# Patient Record
Sex: Female | Born: 1960 | Race: White | Hispanic: No | Marital: Married | Smoking: Former smoker
Health system: Southern US, Community
[De-identification: ages and names within clinical notes are randomized; demographics above are authoritative.]

## PROBLEM LIST (undated history)

## (undated) DIAGNOSIS — E119 Type 2 diabetes mellitus without complications: Secondary | ICD-10-CM

## (undated) DIAGNOSIS — K5790 Diverticulosis of intestine, part unspecified, without perforation or abscess without bleeding: Secondary | ICD-10-CM

## (undated) DIAGNOSIS — M1990 Unspecified osteoarthritis, unspecified site: Secondary | ICD-10-CM

## (undated) DIAGNOSIS — M722 Plantar fascial fibromatosis: Secondary | ICD-10-CM

## (undated) DIAGNOSIS — K760 Fatty (change of) liver, not elsewhere classified: Secondary | ICD-10-CM

## (undated) DIAGNOSIS — N189 Chronic kidney disease, unspecified: Secondary | ICD-10-CM

## (undated) DIAGNOSIS — I6529 Occlusion and stenosis of unspecified carotid artery: Secondary | ICD-10-CM

## (undated) DIAGNOSIS — K449 Diaphragmatic hernia without obstruction or gangrene: Secondary | ICD-10-CM

## (undated) DIAGNOSIS — F419 Anxiety disorder, unspecified: Secondary | ICD-10-CM

## (undated) DIAGNOSIS — K219 Gastro-esophageal reflux disease without esophagitis: Secondary | ICD-10-CM

## (undated) DIAGNOSIS — Z8719 Personal history of other diseases of the digestive system: Secondary | ICD-10-CM

## (undated) DIAGNOSIS — I639 Cerebral infarction, unspecified: Secondary | ICD-10-CM

## (undated) DIAGNOSIS — I10 Essential (primary) hypertension: Secondary | ICD-10-CM

## (undated) DIAGNOSIS — F17200 Nicotine dependence, unspecified, uncomplicated: Secondary | ICD-10-CM

## (undated) DIAGNOSIS — K279 Peptic ulcer, site unspecified, unspecified as acute or chronic, without hemorrhage or perforation: Secondary | ICD-10-CM

## (undated) DIAGNOSIS — F32A Depression, unspecified: Secondary | ICD-10-CM

## (undated) DIAGNOSIS — F329 Major depressive disorder, single episode, unspecified: Secondary | ICD-10-CM

## (undated) DIAGNOSIS — K589 Irritable bowel syndrome without diarrhea: Secondary | ICD-10-CM

## (undated) DIAGNOSIS — I739 Peripheral vascular disease, unspecified: Secondary | ICD-10-CM

## (undated) DIAGNOSIS — M797 Fibromyalgia: Secondary | ICD-10-CM

## (undated) DIAGNOSIS — E7800 Pure hypercholesterolemia, unspecified: Secondary | ICD-10-CM

## (undated) HISTORY — DX: Type 2 diabetes mellitus without complications: E11.9

## (undated) HISTORY — PX: BACK SURGERY: SHX140

## (undated) HISTORY — PX: HERNIA REPAIR: SHX51

## (undated) HISTORY — DX: Irritable bowel syndrome, unspecified: K58.9

## (undated) HISTORY — DX: Cerebral infarction, unspecified: I63.9

## (undated) HISTORY — DX: Fibromyalgia: M79.7

## (undated) HISTORY — DX: Fatty (change of) liver, not elsewhere classified: K76.0

## (undated) HISTORY — DX: Plantar fascial fibromatosis: M72.2

## (undated) HISTORY — PX: TUBAL LIGATION: SHX77

## (undated) HISTORY — PX: TONSILLECTOMY: SUR1361

## (undated) HISTORY — DX: Occlusion and stenosis of unspecified carotid artery: I65.29

---

## 1988-02-18 HISTORY — PX: ABDOMINAL HYSTERECTOMY: SHX81

## 1999-02-18 HISTORY — PX: CHOLECYSTECTOMY: SHX55

## 2001-01-25 ENCOUNTER — Other Ambulatory Visit: Admission: RE | Admit: 2001-01-25 | Discharge: 2001-01-25 | Payer: Self-pay | Admitting: Obstetrics and Gynecology

## 2002-02-17 HISTORY — PX: SPINE SURGERY: SHX786

## 2002-11-15 ENCOUNTER — Encounter: Payer: Self-pay | Admitting: Neurosurgery

## 2002-11-15 ENCOUNTER — Inpatient Hospital Stay (HOSPITAL_COMMUNITY): Admission: RE | Admit: 2002-11-15 | Discharge: 2002-11-19 | Payer: Self-pay | Admitting: Neurosurgery

## 2005-04-15 ENCOUNTER — Ambulatory Visit: Payer: Self-pay | Admitting: Pain Medicine

## 2005-04-30 ENCOUNTER — Ambulatory Visit: Payer: Self-pay | Admitting: Pain Medicine

## 2005-05-18 ENCOUNTER — Encounter: Payer: Self-pay | Admitting: Family Medicine

## 2005-05-18 LAB — CONVERTED CEMR LAB: Pap Smear: NORMAL

## 2005-05-26 ENCOUNTER — Ambulatory Visit (HOSPITAL_COMMUNITY): Admission: RE | Admit: 2005-05-26 | Discharge: 2005-05-26 | Payer: Self-pay | Admitting: Obstetrics and Gynecology

## 2006-03-19 ENCOUNTER — Ambulatory Visit: Payer: Self-pay | Admitting: Family Medicine

## 2006-05-14 ENCOUNTER — Ambulatory Visit: Payer: Self-pay | Admitting: Family Medicine

## 2006-05-28 ENCOUNTER — Ambulatory Visit: Payer: Self-pay | Admitting: Family Medicine

## 2006-05-29 ENCOUNTER — Ambulatory Visit (HOSPITAL_COMMUNITY): Admission: RE | Admit: 2006-05-29 | Discharge: 2006-05-29 | Payer: Self-pay | Admitting: Family Medicine

## 2006-06-01 ENCOUNTER — Encounter: Payer: Self-pay | Admitting: Family Medicine

## 2006-06-01 DIAGNOSIS — E7800 Pure hypercholesterolemia, unspecified: Secondary | ICD-10-CM | POA: Insufficient documentation

## 2006-06-01 DIAGNOSIS — K5730 Diverticulosis of large intestine without perforation or abscess without bleeding: Secondary | ICD-10-CM | POA: Insufficient documentation

## 2006-06-01 DIAGNOSIS — J309 Allergic rhinitis, unspecified: Secondary | ICD-10-CM | POA: Insufficient documentation

## 2006-06-01 DIAGNOSIS — F410 Panic disorder [episodic paroxysmal anxiety] without agoraphobia: Secondary | ICD-10-CM

## 2006-06-01 DIAGNOSIS — F3289 Other specified depressive episodes: Secondary | ICD-10-CM | POA: Insufficient documentation

## 2006-06-01 DIAGNOSIS — Z87898 Personal history of other specified conditions: Secondary | ICD-10-CM | POA: Insufficient documentation

## 2006-06-01 DIAGNOSIS — F329 Major depressive disorder, single episode, unspecified: Secondary | ICD-10-CM

## 2006-06-01 DIAGNOSIS — K219 Gastro-esophageal reflux disease without esophagitis: Secondary | ICD-10-CM

## 2006-06-01 DIAGNOSIS — F17200 Nicotine dependence, unspecified, uncomplicated: Secondary | ICD-10-CM

## 2006-06-15 ENCOUNTER — Telehealth (INDEPENDENT_AMBULATORY_CARE_PROVIDER_SITE_OTHER): Payer: Self-pay | Admitting: *Deleted

## 2006-06-26 ENCOUNTER — Telehealth: Payer: Self-pay | Admitting: Family Medicine

## 2006-12-24 ENCOUNTER — Ambulatory Visit: Payer: Self-pay | Admitting: Gastroenterology

## 2007-01-02 ENCOUNTER — Ambulatory Visit: Payer: Self-pay | Admitting: Gastroenterology

## 2007-01-02 ENCOUNTER — Encounter: Payer: Self-pay | Admitting: Family Medicine

## 2007-01-29 ENCOUNTER — Ambulatory Visit: Payer: Self-pay | Admitting: Gastroenterology

## 2007-01-29 ENCOUNTER — Encounter: Payer: Self-pay | Admitting: Family Medicine

## 2007-02-08 ENCOUNTER — Telehealth: Payer: Self-pay | Admitting: Family Medicine

## 2007-07-14 ENCOUNTER — Ambulatory Visit: Payer: Self-pay | Admitting: Family Medicine

## 2008-04-18 ENCOUNTER — Ambulatory Visit: Payer: Self-pay | Admitting: Internal Medicine

## 2008-05-17 ENCOUNTER — Ambulatory Visit: Payer: Self-pay | Admitting: Internal Medicine

## 2008-08-08 ENCOUNTER — Ambulatory Visit: Payer: Self-pay | Admitting: General Surgery

## 2008-08-11 ENCOUNTER — Ambulatory Visit: Payer: Self-pay | Admitting: General Surgery

## 2008-08-14 ENCOUNTER — Ambulatory Visit: Payer: Self-pay | Admitting: General Surgery

## 2008-11-09 ENCOUNTER — Ambulatory Visit: Payer: Self-pay | Admitting: Internal Medicine

## 2008-11-13 ENCOUNTER — Ambulatory Visit: Payer: Self-pay | Admitting: Gastroenterology

## 2008-11-17 ENCOUNTER — Ambulatory Visit: Payer: Self-pay | Admitting: Internal Medicine

## 2010-09-30 ENCOUNTER — Inpatient Hospital Stay: Payer: Self-pay | Admitting: Internal Medicine

## 2010-10-07 LAB — PATHOLOGY REPORT

## 2011-02-18 DIAGNOSIS — I639 Cerebral infarction, unspecified: Secondary | ICD-10-CM

## 2011-02-18 HISTORY — DX: Cerebral infarction, unspecified: I63.9

## 2011-02-21 ENCOUNTER — Other Ambulatory Visit: Payer: Self-pay

## 2011-02-21 LAB — CBC WITH DIFFERENTIAL/PLATELET
Basophil #: 0.1 10*3/uL (ref 0.0–0.1)
Basophil %: 0.9 %
Eosinophil #: 0.2 10*3/uL (ref 0.0–0.7)
Eosinophil %: 2.9 %
HCT: 40.7 % (ref 35.0–47.0)
HGB: 13.8 g/dL (ref 12.0–16.0)
Lymphocyte #: 3 10*3/uL (ref 1.0–3.6)
Lymphocyte %: 37.7 %
MCH: 28.5 pg (ref 26.0–34.0)
MCHC: 34.1 g/dL (ref 32.0–36.0)
MCV: 84 fL (ref 80–100)
Monocyte #: 0.6 10*3/uL (ref 0.0–0.7)
Monocyte %: 7.1 %
Neutrophil #: 4.1 10*3/uL (ref 1.4–6.5)
Neutrophil %: 51.4 %
Platelet: 288 10*3/uL (ref 150–440)
RBC: 4.86 10*6/uL (ref 3.80–5.20)
RDW: 14.9 % — ABNORMAL HIGH (ref 11.5–14.5)
WBC: 8.1 10*3/uL (ref 3.6–11.0)

## 2011-02-21 LAB — COMPREHENSIVE METABOLIC PANEL
Albumin: 4 g/dL (ref 3.4–5.0)
Alkaline Phosphatase: 89 U/L (ref 50–136)
Anion Gap: 9 (ref 7–16)
BUN: 22 mg/dL — ABNORMAL HIGH (ref 7–18)
Bilirubin,Total: 0.2 mg/dL (ref 0.2–1.0)
Calcium, Total: 9.3 mg/dL (ref 8.5–10.1)
Chloride: 100 mmol/L (ref 98–107)
Co2: 29 mmol/L (ref 21–32)
Creatinine: 0.63 mg/dL (ref 0.60–1.30)
EGFR (African American): >60
EGFR (Non-African Amer.): >60
Glucose: 162 mg/dL — ABNORMAL HIGH (ref 65–99)
Osmolality: 283 (ref 275–301)
Potassium: 3.9 mmol/L (ref 3.5–5.1)
SGOT(AST): 30 U/L (ref 15–37)
SGPT (ALT): 43 U/L
Sodium: 138 mmol/L (ref 136–145)
Total Protein: 8.2 g/dL (ref 6.4–8.2)

## 2011-02-21 LAB — TSH: Thyroid Stimulating Horm: 1.76 u[IU]/mL

## 2011-03-17 ENCOUNTER — Ambulatory Visit: Payer: Self-pay | Admitting: Rheumatology

## 2011-03-17 LAB — SEDIMENTATION RATE: Erythrocyte Sed Rate: 16 mm/hr (ref 0–20)

## 2011-03-17 LAB — CK: CK, Total: 86 U/L (ref 21–215)

## 2011-04-22 ENCOUNTER — Encounter: Payer: Self-pay | Admitting: Rheumatology

## 2011-05-19 ENCOUNTER — Encounter: Payer: Self-pay | Admitting: Rheumatology

## 2011-06-18 ENCOUNTER — Encounter: Payer: Self-pay | Admitting: Rheumatology

## 2011-06-25 ENCOUNTER — Other Ambulatory Visit: Payer: Self-pay

## 2011-06-25 LAB — COMPREHENSIVE METABOLIC PANEL
Albumin: 3.7 g/dL (ref 3.4–5.0)
Alkaline Phosphatase: 126 U/L (ref 50–136)
Bilirubin,Total: 0.3 mg/dL (ref 0.2–1.0)
Chloride: 98 mmol/L (ref 98–107)
Creatinine: 0.62 mg/dL (ref 0.60–1.30)
EGFR (African American): >60
EGFR (Non-African Amer.): >60
Glucose: 298 mg/dL — ABNORMAL HIGH (ref 65–99)
Osmolality: 278 (ref 275–301)
Sodium: 133 mmol/L — ABNORMAL LOW (ref 136–145)

## 2011-06-25 LAB — HEMOGLOBIN A1C: Hemoglobin A1C: 10.9 % — ABNORMAL HIGH (ref 4.2–6.3)

## 2011-12-31 ENCOUNTER — Ambulatory Visit: Payer: Self-pay

## 2012-01-31 ENCOUNTER — Inpatient Hospital Stay: Payer: Self-pay | Admitting: Internal Medicine

## 2012-01-31 DIAGNOSIS — I639 Cerebral infarction, unspecified: Secondary | ICD-10-CM

## 2012-01-31 HISTORY — DX: Cerebral infarction, unspecified: I63.9

## 2012-01-31 LAB — COMPREHENSIVE METABOLIC PANEL
Alkaline Phosphatase: 105 U/L (ref 50–136)
Anion Gap: 8 (ref 7–16)
BUN: 19 mg/dL — ABNORMAL HIGH (ref 7–18)
Bilirubin,Total: 0.3 mg/dL (ref 0.2–1.0)
Calcium, Total: 9.2 mg/dL (ref 8.5–10.1)
Chloride: 102 mmol/L (ref 98–107)
Co2: 26 mmol/L (ref 21–32)
Creatinine: 0.55 mg/dL — ABNORMAL LOW (ref 0.60–1.30)
EGFR (African American): >60
EGFR (Non-African Amer.): >60
Glucose: 143 mg/dL — ABNORMAL HIGH (ref 65–99)
SGPT (ALT): 53 U/L (ref 12–78)
Total Protein: 8.4 g/dL — ABNORMAL HIGH (ref 6.4–8.2)

## 2012-01-31 LAB — URINALYSIS, COMPLETE
Ketone: NEGATIVE
Leukocyte Esterase: NEGATIVE
Nitrite: POSITIVE
Ph: 5 (ref 4.5–8.0)
Protein: NEGATIVE
Specific Gravity: 1.012 (ref 1.003–1.030)
WBC UR: 8 /HPF (ref 0–5)

## 2012-01-31 LAB — APTT: Activated PTT: 27.2 secs (ref 23.6–35.9)

## 2012-01-31 LAB — PROTIME-INR: INR: 0.9

## 2012-01-31 LAB — CBC WITH DIFFERENTIAL/PLATELET
Basophil #: 0.1 10*3/uL (ref 0.0–0.1)
Basophil %: 0.9 %
Eosinophil #: 0.2 10*3/uL (ref 0.0–0.7)
HGB: 15.6 g/dL (ref 12.0–16.0)
Lymphocyte #: 3.9 10*3/uL — ABNORMAL HIGH (ref 1.0–3.6)
MCHC: 33.2 g/dL (ref 32.0–36.0)
Monocyte #: 0.7 x10 3/mm (ref 0.2–0.9)
Neutrophil #: 5.4 10*3/uL (ref 1.4–6.5)
Neutrophil %: 52.3 %
RBC: 5.62 10*6/uL — ABNORMAL HIGH (ref 3.80–5.20)
RDW: 15 % — ABNORMAL HIGH (ref 11.5–14.5)

## 2012-01-31 LAB — CK TOTAL AND CKMB (NOT AT ARMC): CK, Total: 89 U/L (ref 21–215)

## 2012-01-31 LAB — TROPONIN I: Troponin-I: <0.02 ng/mL

## 2012-02-01 DIAGNOSIS — G459 Transient cerebral ischemic attack, unspecified: Secondary | ICD-10-CM

## 2012-02-01 LAB — BASIC METABOLIC PANEL
Anion Gap: 8 (ref 7–16)
Calcium, Total: 9.3 mg/dL (ref 8.5–10.1)
EGFR (African American): >60
EGFR (Non-African Amer.): >60
Glucose: 133 mg/dL — ABNORMAL HIGH (ref 65–99)
Osmolality: 279 (ref 275–301)
Potassium: 4.1 mmol/L (ref 3.5–5.1)

## 2012-02-01 LAB — CBC WITH DIFFERENTIAL/PLATELET
Basophil %: 0.6 %
Eosinophil %: 3 %
HCT: 47.2 % — ABNORMAL HIGH (ref 35.0–47.0)
HGB: 16 g/dL (ref 12.0–16.0)
Lymphocyte #: 3.1 10*3/uL (ref 1.0–3.6)
Lymphocyte %: 32.8 %
MCHC: 33.9 g/dL (ref 32.0–36.0)
MCV: 85 fL (ref 80–100)
Monocyte %: 7.6 %
Neutrophil %: 56 %
RBC: 5.58 10*6/uL — ABNORMAL HIGH (ref 3.80–5.20)
WBC: 9.5 10*3/uL (ref 3.6–11.0)

## 2012-02-01 LAB — LIPID PANEL
Cholesterol: 351 mg/dL — ABNORMAL HIGH (ref 0–200)
HDL Cholesterol: 35 mg/dL — ABNORMAL LOW (ref 40–60)
Triglycerides: 617 mg/dL — ABNORMAL HIGH (ref 0–200)

## 2012-02-01 LAB — PROTIME-INR: INR: 0.8

## 2012-02-01 LAB — TROPONIN I: Troponin-I: <0.02 ng/mL

## 2012-02-01 LAB — CK TOTAL AND CKMB (NOT AT ARMC): CK, Total: 104 U/L (ref 21–215)

## 2012-02-18 DIAGNOSIS — I6523 Occlusion and stenosis of bilateral carotid arteries: Secondary | ICD-10-CM

## 2012-02-18 HISTORY — DX: Occlusion and stenosis of bilateral carotid arteries: I65.23

## 2012-02-27 ENCOUNTER — Encounter: Payer: Self-pay | Admitting: Neurology

## 2012-02-27 ENCOUNTER — Ambulatory Visit (INDEPENDENT_AMBULATORY_CARE_PROVIDER_SITE_OTHER): Payer: BC Managed Care – PPO | Admitting: Neurology

## 2012-02-27 ENCOUNTER — Other Ambulatory Visit: Payer: Self-pay | Admitting: Neurology

## 2012-02-27 VITALS — BP 124/80 | HR 90 | Temp 98.1°F | Resp 12 | Ht 67.0 in | Wt 203.0 lb

## 2012-02-27 DIAGNOSIS — I6350 Cerebral infarction due to unspecified occlusion or stenosis of unspecified cerebral artery: Secondary | ICD-10-CM

## 2012-02-27 DIAGNOSIS — G4730 Sleep apnea, unspecified: Secondary | ICD-10-CM

## 2012-02-27 DIAGNOSIS — I639 Cerebral infarction, unspecified: Secondary | ICD-10-CM

## 2012-02-27 MED ORDER — ASPIRIN-DIPYRIDAMOLE ER 25-200 MG PO CP12: 1.0000 | ORAL_CAPSULE | Freq: Two times a day (BID) | ORAL | Status: DC

## 2012-02-27 NOTE — Patient Instructions (Addendum)
Follow up in 4 months 

## 2012-02-27 NOTE — Progress Notes (Signed)
Morgan Daniels is 52 YO woman with DM type II and high cholesterol as well as a past history of fatty liver, IB, tremor, ADD, severe GERD, obesity with a current BMI of 31.7 and now with recent acute CVA.  2 months ago she had a single episode of veering to the left and maybe hand tingling and it cleared within a few minutes.  Last month she was admitted to [facility] for sudden onset of right hand tingling, right leg weakness and changes in orientation and speech.  Initial CT negative but follow up MRI showed acute CVA in the ? Right parietal area per DC summary as well as some evidence of past ischemia, location not sepcified.  We today have a signed release to acquire the mri images for review.  During her hospital stay of 2 days, she improved a lot and has minimal residual weakness of the right leg and she speaks well, but her memory and processing speed feel slow.  She has not yet tried to drive because she feels weird.  ECHO was unremarkable.  She was told she had 57% carotid stenosis and that will be followed.  She was placed on aggrenox bid and she is taking it.  Plavix was not given as she takes prilosec which she says is a must.  She denies previously being aware of having had a stroke.  She does snore and sometimes gasps for breath.  She sleeps poorly and has daytime sleepiness and fatigue.  Her vision acuiyt is decreased since the stroke, but she has not had her eyes checked in some time.  She has had some elevated Hgb Aic readings in the past 12 months and she is going to try to lose some weight.  She did not tolerate the choesterol lowering drug she was given.  Now she would like to get an opinion aboiut the MRI and her condition and advice for the future and prognosis.  Past Medical History  Diagnosis Date  . IBS (irritable bowel syndrome)   . Fibromyalgia   . Plantar fasciitis   . Fatty liver   . Diabetes     No current outpatient prescriptions on file prior to visit.   she is taking aggrenox  bid, elavil,gabapentin 600 bid, vit D3, zyrtec 10, invokana 10, zofran 8 prn fluoxetine 40, prilosec, alprazolam 0.5 prn, HCTZ and glimiprimide for type II DM  Allergies: Morphine and related   History   Social History  . Marital Status: Married    Spouse Name: N/A    Number of Children: N/A  . Years of Education: N/A   Occupational History  . Not on file.   Social History Main Topics  . Smoking status: Current Every Day Smoker  . Smokeless tobacco: Never Used  . Alcohol Use: Not on file     Comment: once a month  . Drug Use: No  . Sexually Active: Not on file   Other Topics Concern  . Not on file   Social History Narrative  . No narrative on file   No family history on file.  Mother has tremor and high cholesterol and borderline DM.  Review of symptoms pos for GERD,imbalance,urinary urgency, joint pain, depression, anxiety, sinus issues, poor sleep, poor memory, IBS, tremor of the hands, fear of parkinson's.  Remainder of 14 point ros unremarkable.  Alert and oriented x 3.  Memory function appears to be intact.  Concentration and attention are normal for educational level and background.  Speech is fluent and  without significant word finding difficulty.  Is aware of current events. Is nervous and ADD like  No carotid bruits detected.  Cranial nerve II through XII are within normal limits.  This includes normal optic discs and acuity, EOMI, PERLA, facial movement and sensation intact, hearing grossly intact, gag intact,Uvula raises symmetrically and tongue protrudes evenly. Motor strength is 5 over 5 except 4/5 right ankle dorsiflexion.  No atrophy, abnormal tone .  Fine tremor of the hands withintention and postures but not at rest. Reflexes are1+ and symmetric in the upper and lower extremities Sensory exam is intact.  No extinction on double simultaneous stim and graphesthesia is normal. Coordination is intact for fine movements and rapid alternating movements in all  limbs Gait and station are normal.   Impression. 51 YO with obesity and DM and high cholesterol with CVA said to be right parietal and 57% carotid stenosis  However, not tolerating cholesterol lowering medications at this time.  She is tolerating aggrenox and as she needs prilosec for GERD, this is preferred over the plavix for CVA prevention.  Also with obesity and observed gasping and daytime fatigue, R/O OSA as this can be a significant stroke risk factor.  The tremor she has is not the parkinsosn's type and she is reassured.  Plan  PSG to rule out OSA Continue aggrenox Work her way slowly back into driving Release of info for MRI and review images Repeat carotid US in 6 months RTC 1 month for follow up.

## 2012-03-08 ENCOUNTER — Telehealth: Payer: Self-pay | Admitting: Neurology

## 2012-03-08 NOTE — Telephone Encounter (Signed)
Picked up a call from the patient, Morgan Daniels. She reports that she was started on Aggrenox in the hospital and got her first bottle free but is now unable to afford the 300 dollar a month for a refill. She wanted me to ask Dr. Smiley Houseman if their was something she could take that was comparable to the Aggrenox that would not interfere with her other meds, specifically the Prilosec. I let her know that I would send him a message and get back with her as soon as I heard back from him. She is ok with this plan. She states she took the last pill yesterday and is now out of the medication. [facility] [place] **Dr. Smiley Houseman, please advise substitute for Aggrenox that will be compatable with her other meds.

## 2012-03-10 NOTE — Telephone Encounter (Signed)
Left a message for the patient to return my call.  

## 2012-03-10 NOTE — Telephone Encounter (Signed)
Closest would be dipyridamole 75 mg tid and baby coated asa 81 mg every day or 4 times a week.  We can call in for her #90 dipyrimadole with 12 refills and get ASA otc

## 2012-03-11 ENCOUNTER — Other Ambulatory Visit: Payer: Self-pay | Admitting: Neurology

## 2012-03-11 MED ORDER — DIPYRIDAMOLE 75 MG PO TABS: 75.0000 mg | ORAL_TABLET | Freq: Three times a day (TID) | ORAL | Status: DC

## 2012-03-11 NOTE — Telephone Encounter (Signed)
Patient returned my call. Discussed med change as recommended by Dr. Smiley Houseman below. Will [facility] [place] at [phone] as pt requested. No other issues at this time.

## 2012-03-24 ENCOUNTER — Telehealth: Payer: Self-pay | Admitting: Neurology

## 2012-03-24 NOTE — Telephone Encounter (Signed)
Called and left the patient a voice mail message stating that Dr. Smiley Houseman had not received the CDs and that I had again faxed the release of information to [facility] (first request faxed on 02/27/12). Asked that she call with additional questions and/or concerns.

## 2012-03-24 NOTE — Telephone Encounter (Signed)
The patient called to inquire if our office had received & reviewed  her scans from [facility].  The patient may be reached at [phone].

## 2012-03-31 ENCOUNTER — Ambulatory Visit (INDEPENDENT_AMBULATORY_CARE_PROVIDER_SITE_OTHER): Payer: BC Managed Care – PPO | Admitting: Neurology

## 2012-03-31 ENCOUNTER — Encounter: Payer: Self-pay | Admitting: Neurology

## 2012-03-31 VITALS — BP 128/80 | HR 94 | Temp 97.9°F | Resp 16 | Ht 66.5 in | Wt 205.0 lb

## 2012-03-31 DIAGNOSIS — I63239 Cerebral infarction due to unspecified occlusion or stenosis of unspecified carotid arteries: Secondary | ICD-10-CM

## 2012-03-31 NOTE — Progress Notes (Signed)
Morgan Daniels returns for one-month followup of recent stroke.  She has had symptoms of right-sided weakness, although her MRI from her recent hospitalization dated January 31, 2012 reveals effusion. Changes in the right parietal area. This is a multifocal complex of increased signal suggesting possibly a breakup of an embolism.  A more chronic nature, there are bilateral periventricular white matter lesions ranging from 2-6 mm.  She was also found to have bilateral atherosclerosis in the carotid regions, but only hemodynamically significant on the left. It was estimated that she has 50% left internal carotid artery stenosis.  She has been taking Aggrenox which was not covered or not affordable, and now she has been taking combination dipyridamole 75 mg t.i.d. Plus aspirin 81 mg. However, since her last visit one month ago, she had an episode of amber assist you [name] the left eye causing loss of vision in the upper field of vision of left eye for a brief period time followed by cobweb like appearance to last for up to 2 hours. She went to her I. Doctor in the eye doctor was able to see evidence of arterial blockage in the periphery of the retina on the left eye. \ She was also scheduled for a sleep study, but they have not met there deductable and they could not bring the $600 it was requested for this study.  However, they plan to have the study done later in the year.  I reviewed the MRI with the radiologist at [facility] and they are in agreement with the interpretation from [facility] regional. He does explain why the symptoms were on the right. My [name] is that there was some ischemia in the left brain but does not clearly showing on the MRI and that the diffusion changes seen in the right were largely asymptomatic.  In any event, she has a 58% stenosis and was treated with antiplatelet regimen and she is now developed a symptom of amorosis fugax, in the eye doctor did corroborate findings on her eye exam  following this episode. While she is not excited about the prospect of surgery, she is willing to have the surgery done if it would lower her chances of having a significant stroke.  Remainder of the review of symptoms is unremarkable for the interim since her last visit.  Past Medical History  Diagnosis Date  . IBS (irritable bowel syndrome)   . Fibromyalgia   . Plantar fasciitis   . Fatty liver   . Diabetes     Current Outpatient Prescriptions on File Prior to Visit  Medication Sig Dispense Refill  . dipyridamole (PERSANTINE) 75 MG tablet Take 1 tablet (75 mg total) by mouth 3 (three) times daily.  90 tablet  12   No current facility-administered medications on file prior to visit.   Morphine and related cause reaction or allergy History   Social History  . Marital Status: Married    Spouse Name: N/A    Number of Children: N/A  . Years of Education: N/A   Occupational History  . Not on file.   Social History Main Topics  . Smoking status: Current Every Day Smoker  . Smokeless tobacco: Never Used  . Alcohol Use: Yes     Comment: once a month  . Drug Use: No  . Sexually Active: Not on file   Other Topics Concern  . Not on file   Social History Narrative  . No narrative on file    No family history on file.  She is not 100% sure on her biological father.  BP 128/80  Pulse 94  Temp(Src) 97.9 F (36.6 C)  Resp 16  Ht 5' 6.5" (1.689 m)  Wt 205 lb (92.987 kg)  BMI 32.6 kg/m2    Alert and oriented x 3.  Memory function appears to be intact.  Concentration and attention are normal for educational level and background.  Speech is fluent and without significant word finding difficulty.  Is aware of current events.  No carotid bruits detected.  Cranial nerve II through XII are within normal limits.  This includes normal optic discs and acuity, EOMI, PERLA, facial movement and sensation intact, hearing grossly intact, gag intact,Uvula raises symmetrically and tongue  protrudes evenly. Motor strength is 5 over 5 throughout all limbs excepting 5-/5 right ankle dorsiflexion.  No atrophy, abnormal tone or tremors. Reflexes are 1+ and symmetric in the upper and lower extremities.I Sensory exam is intact. Coordination is intact for fine movements and rapid alternating movements in all limbs Gait shows some limp and difficulty with tandem.   Impression: 1. Symptomatic left carotid stenosis estimated to be 58%, with admission in December for strokelike symptoms in now with episode of amarosis fugax within the last month while on antiplatelet therapy. 2. Possible objective sleep apnea but not tested as of yet.  Plan: 1. We will refer the patient to [facility] vein and vascular for consideration of carotid endarterectomy surgery to decrease her stroke risk. 2. The patient will bring disc with her MRI images and her carotid ultrasound to her appointment. We will forward the radiology reports with the referral request. 3. Continue her aspirin and dipyridamole for the time being.

## 2012-04-03 ENCOUNTER — Other Ambulatory Visit: Payer: Self-pay

## 2012-04-06 ENCOUNTER — Other Ambulatory Visit: Payer: Self-pay | Admitting: *Deleted

## 2012-04-06 DIAGNOSIS — H3400 Transient retinal artery occlusion, unspecified eye: Secondary | ICD-10-CM

## 2012-04-27 ENCOUNTER — Encounter: Payer: Self-pay | Admitting: Vascular Surgery

## 2012-04-28 ENCOUNTER — Ambulatory Visit (INDEPENDENT_AMBULATORY_CARE_PROVIDER_SITE_OTHER): Payer: BC Managed Care – PPO | Admitting: Vascular Surgery

## 2012-04-28 ENCOUNTER — Encounter: Payer: Self-pay | Admitting: Vascular Surgery

## 2012-04-28 ENCOUNTER — Other Ambulatory Visit: Payer: Self-pay

## 2012-04-28 ENCOUNTER — Other Ambulatory Visit (INDEPENDENT_AMBULATORY_CARE_PROVIDER_SITE_OTHER): Payer: BC Managed Care – PPO | Admitting: Vascular Surgery

## 2012-04-28 NOTE — Progress Notes (Signed)
[facility] [place]  Patient name: Morgan Daniels MRN: [id number] DOB: 1960/10/03 Sex: female  REASON FOR CONSULT: Symptomatic carotid disease. Referred by Dr. Murriel Hopper.  HPI: Morgan Daniels is a 52 y.o. female who was hospitalized for a stroke which occurred in December of 2013. At that time duplex scan suggested a 50% left internal carotid artery stenosis. She had developed the sudden onset of weakness in the right upper extremity and right lower extremity. He also had some expressive aphasia. She states that these symptoms lasted approximately 2 days. She had an MRI which interestingly showed changes in the right parietal lobe which were multifocal. She has been on aspirin and dipyridamole. She later developed some visual field changes with loss of vision in the upper field of the left eye. 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 He states that his symptoms were in the left eye however. She's had no further episodes of weakness appears seizures in her upper extremities were lower extremities.  She sent for vascular evaluation and a carotid duplex scan today.  She does have diabetes, hypertension, and hypercholesterolemia. She is on a statin (Lipitor). She denies any history of myocardial infarction or history of congestive heart failure. There is no family history of premature cardiovascular disease. She has smoked 1-1/2 packs per day of cigarettes for well over 30 years. She states that she is trying to quit currently.  Past Medical History  Diagnosis Date  . IBS (irritable bowel syndrome)   . Fibromyalgia   . Plantar fasciitis   . Fatty liver   . Diabetes   . Stroke Dec. 14,2013    Right side  . Carotid artery occlusion     Family History  Problem Relation Age of Onset  . Hypertension Mother   . Cancer Father   . Hypertension Maternal Grandmother     SOCIAL  HISTORY: History  Substance Use Topics  . Smoking status: Current Every Day Smoker  . Smokeless tobacco: Never Used  . Alcohol Use: Yes     Comment: once a month    Allergies  Allergen Reactions  . Simvastatin Diarrhea and Nausea And Vomiting  . Morphine And Related Itching    itching    Current Outpatient Prescriptions  Medication Sig Dispense Refill  . ALPRAZolam (XANAX) 0.5 MG tablet Take 0.5 mg by mouth 2 (two) times daily as needed.      [name] amitriptyline (ELAVIL) 100 MG tablet Take 100 mg by mouth at bedtime.      [name] atorvastatin (LIPITOR) 10 MG tablet Take 10 mg by mouth daily.      . bisoprolol (ZEBETA) 5 MG tablet Take 5 mg by mouth daily. Take 1/2 tab (2.5 mg) daily for BP      . Canagliflozin (INVOKANA) 100 MG TABS Take 100 mg by mouth daily.      . cetirizine (ZYRTEC) 10 MG tablet Take 10 mg by mouth daily.      . diphenhydramine-acetaminophen (TYLENOL PM) 25-500 MG TABS Take 1 tablet by mouth at bedtime as needed.      . dipyridamole (PERSANTINE) 75 MG tablet Take 1 tablet (75 mg total) by mouth 3 (three) times daily.  90 tablet  12  . FA-B6-B12-D-Omega 3-Phytoster (ANIMI-3/VITAMIN D PO) Take 5,000 Units by mouth daily.      [name] FLUoxetine (PROZAC) 40 MG capsule Take 40 mg by mouth daily.      [name]  gabapentin (NEURONTIN) 600 MG tablet Take 600 mg by mouth 2 (two) times daily.      [name] glimepiride (AMARYL) 4 MG tablet Take 4 mg by mouth daily after supper.      . hydrochlorothiazide (MICROZIDE) 12.5 MG capsule Take 12.5 mg by mouth daily.      . insulin detemir (LEVEMIR) 100 UNIT/ML injection Inject into the skin at bedtime.      . insulin lispro (HUMALOG) 100 UNIT/ML injection Inject 100 Units into the skin 3 (three) times daily before meals. Sliding scale prn      . omeprazole (PRILOSEC) 40 MG capsule Take 40 mg by mouth daily.      . ondansetron (ZOFRAN) 8 MG tablet Take by mouth every 8 (eight) hours as needed for nausea.       No current facility-administered medications  for this visit.    REVIEW OF SYSTEMS: Arly.Keller ] denotes positive finding; [  ] denotes negative finding  CARDIOVASCULAR:  [ ]  chest pain   [ ]  chest pressure   [ ]  palpitations   [ ]  orthopnea   [ ]  dyspnea on exertion   [ ]  claudication   [ ]  rest pain   [ ]  DVT   [ ]  phlebitis PULMONARY:   [ ]  productive cough   [ ]  asthma   [ ]  wheezing NEUROLOGIC:   Arly.Keller ] weakness  [ ]  paresthesias  [ ]  aphasia  Arly.Keller ] amaurosis  [ ]  dizziness HEMATOLOGIC:   [ ]  bleeding problems   [ ]  clotting disorders MUSCULOSKELETAL:  [ ]  joint pain   [ ]  joint swelling [ ]  leg swelling GASTROINTESTINAL: [ ]   blood in stool  [ ]   hematemesis GENITOURINARY:  [ ]   dysuria  [ ]   hematuria PSYCHIATRIC:  [ ]  history of major depression INTEGUMENTARY:  [ ]  rashes  [ ]  ulcers CONSTITUTIONAL:  [ ]  fever   [ ]  chills  PHYSICAL EXAM: Filed Vitals:   04/28/12 1350 04/28/12 1354  BP: 124/88 120/79  Pulse: 87 90  Resp: 16   Height: 5\' 7"  (1.702 m)   Weight: 202 lb (91.627 kg)   SpO2: 97% 100%   Body mass index is 31.63 kg/(m^2). GENERAL: The patient is a well-nourished female, in no acute distress. The vital signs are documented above. CARDIOVASCULAR: There is a regular rate and rhythm. I do not detect carotid bruits. She has palpable femoral pulses. PULMONARY: There is good air exchange bilaterally without wheezing or rales. ABDOMEN: Soft and non-tender with normal pitched bowel sounds.  MUSCULOSKELETAL: There are no major deformities or cyanosis. NEUROLOGIC: No focal weakness or paresthesias are detected. SKIN: There are no ulcers or rashes noted. PSYCHIATRIC: The patient has a normal affect.  DATA:  I have independently interpreted the carotid duplex scan in our office today which shows evidence of a 40-59% right internal carotid artery stenosis and a less than 40% left internal carotid artery stenosis. Vertebral arteries are patent with antegrade flow.  I have reviewed her records from [facility] in [place]. On rectal exam she had emboli noted in the right eye. I have also reviewed her records sent from Dr. Hyacinth Meeker office.  MEDICAL ISSUES: SYMPTOMATIC BILATERAL CAROTID DISEASE: This patient has only mild disease on carotid duplex scan. There is a 40-59% right carotid stenosis with a less than 40% left carotid stenosis. Given that she had right-sided weakness I would be most suspicious of the stenosis on the left  being symptomatic although MRI did show evidence of disease on the right. Visual disturbance was also on the left. Given that there is no significant stenosis noted on duplex on the left, I have recommended cerebral arteriography in order to look for ulceration on the left which might explain her left hemispheric symptoms. Likewise we can further evaluate the stenosis on the right is given the MRI findings this could potentially be symptomatic also. I've explained that she has a smooth mild stenosis then I do not think she would benefit from carotid endarterectomy. However if she had an ulcerated plaque or a more significant narrowing then consideration could be given to carotid endarterectomy. I have discussed the indications for cerebral arteriography. I've also discussed the potential complications of the procedure, including but not limited to: Bleeding, arterial injury, stroke (1%. Procedural risk), renal insufficiency, or other unpredictable medical problems. All the patient's questions were answered and they are agreeable to proceed. We will make further recommendations pending the results of her cerebral arteriogram. In the meantime she knows to continue taking her aspirin. Also discussed the importance of tobacco cessation. I have discussed with the patient the nature of atherosclerosis, and emphasized the importance of maximal medical management including control of blood pressure, blood glucose, and cholesterol levels, antiplatelet agents, obtaining regular exercise, and cessation of  smoking. The patient is aware that without maximal medical management the underlying atherosclerotic disease process will progress and also limit the benefit of any interventions.  DICKSON,CHRISTOPHER S [facility] [place] [facility]: [phone]

## 2012-04-30 ENCOUNTER — Encounter (HOSPITAL_COMMUNITY): Payer: Self-pay | Admitting: Pharmacy Technician

## 2012-05-03 ENCOUNTER — Ambulatory Visit (HOSPITAL_COMMUNITY): Payer: BC Managed Care – PPO

## 2012-05-03 ENCOUNTER — Ambulatory Visit (HOSPITAL_COMMUNITY)
Admission: RE | Admit: 2012-05-03 | Discharge: 2012-05-03 | Disposition: A | Discharge status: Home / Self Care | Payer: BC Managed Care – PPO | Source: Ambulatory Visit | Attending: Vascular Surgery | Admitting: Vascular Surgery

## 2012-05-03 ENCOUNTER — Encounter (HOSPITAL_COMMUNITY): Payer: Self-pay | Admitting: *Deleted

## 2012-05-03 ENCOUNTER — Other Ambulatory Visit: Payer: Self-pay

## 2012-05-03 ENCOUNTER — Encounter (HOSPITAL_COMMUNITY)
Admission: RE | Disposition: A | Discharge status: Home / Self Care | Payer: Self-pay | Source: Ambulatory Visit | Attending: Vascular Surgery

## 2012-05-03 DIAGNOSIS — I6529 Occlusion and stenosis of unspecified carotid artery: Secondary | ICD-10-CM | POA: Insufficient documentation

## 2012-05-03 DIAGNOSIS — K7689 Other specified diseases of liver: Secondary | ICD-10-CM | POA: Insufficient documentation

## 2012-05-03 DIAGNOSIS — M722 Plantar fascial fibromatosis: Secondary | ICD-10-CM | POA: Insufficient documentation

## 2012-05-03 DIAGNOSIS — Z794 Long term (current) use of insulin: Secondary | ICD-10-CM | POA: Insufficient documentation

## 2012-05-03 DIAGNOSIS — K589 Irritable bowel syndrome without diarrhea: Secondary | ICD-10-CM | POA: Insufficient documentation

## 2012-05-03 DIAGNOSIS — I658 Occlusion and stenosis of other precerebral arteries: Secondary | ICD-10-CM | POA: Insufficient documentation

## 2012-05-03 DIAGNOSIS — Z79899 Other long term (current) drug therapy: Secondary | ICD-10-CM | POA: Insufficient documentation

## 2012-05-03 DIAGNOSIS — Z888 Allergy status to other drugs, medicaments and biological substances status: Secondary | ICD-10-CM | POA: Insufficient documentation

## 2012-05-03 DIAGNOSIS — F17200 Nicotine dependence, unspecified, uncomplicated: Secondary | ICD-10-CM | POA: Insufficient documentation

## 2012-05-03 DIAGNOSIS — Z8673 Personal history of transient ischemic attack (TIA), and cerebral infarction without residual deficits: Secondary | ICD-10-CM | POA: Insufficient documentation

## 2012-05-03 DIAGNOSIS — IMO0001 Reserved for inherently not codable concepts without codable children: Secondary | ICD-10-CM | POA: Insufficient documentation

## 2012-05-03 DIAGNOSIS — Z885 Allergy status to narcotic agent status: Secondary | ICD-10-CM | POA: Insufficient documentation

## 2012-05-03 DIAGNOSIS — E119 Type 2 diabetes mellitus without complications: Secondary | ICD-10-CM | POA: Insufficient documentation

## 2012-05-03 HISTORY — DX: Anxiety disorder, unspecified: F41.9

## 2012-05-03 HISTORY — DX: Personal history of other diseases of the digestive system: Z87.19

## 2012-05-03 HISTORY — DX: Gastro-esophageal reflux disease without esophagitis: K21.9

## 2012-05-03 HISTORY — DX: Essential (primary) hypertension: I10

## 2012-05-03 HISTORY — PX: CEREBRAL ANGIOGRAM: SHX5506

## 2012-05-03 HISTORY — DX: Depression, unspecified: F32.A

## 2012-05-03 HISTORY — DX: Major depressive disorder, single episode, unspecified: F32.9

## 2012-05-03 LAB — GLUCOSE, CAPILLARY: Glucose-Capillary: 242 mg/dL — ABNORMAL HIGH (ref 70–99)

## 2012-05-03 LAB — URINALYSIS, ROUTINE W REFLEX MICROSCOPIC
Bilirubin Urine: NEGATIVE
Glucose, UA: >1000 mg/dL — AB
Ketones, ur: NEGATIVE mg/dL
pH: 5 (ref 5.0–8.0)

## 2012-05-03 LAB — CBC
HCT: 42.6 % (ref 36.0–46.0)
Hemoglobin: 14.5 g/dL (ref 12.0–15.0)
MCH: 28.6 pg (ref 26.0–34.0)
MCHC: 34 g/dL (ref 30.0–36.0)

## 2012-05-03 LAB — POCT I-STAT, CHEM 8
Calcium, Ion: 1.21 mmol/L (ref 1.12–1.23)
Chloride: 106 mEq/L (ref 96–112)
HCT: 45 % (ref 36.0–46.0)
Hemoglobin: 15.3 g/dL — ABNORMAL HIGH (ref 12.0–15.0)
Potassium: 4.1 mEq/L (ref 3.5–5.1)

## 2012-05-03 LAB — COMPREHENSIVE METABOLIC PANEL
BUN: 16 mg/dL (ref 6–23)
Calcium: 8.9 mg/dL (ref 8.4–10.5)
Creatinine, Ser: 0.46 mg/dL — ABNORMAL LOW (ref 0.50–1.10)
GFR calc Af Amer: >90 mL/min (ref >90)
Glucose, Bld: 277 mg/dL — ABNORMAL HIGH (ref 70–99)
Total Protein: 7 g/dL (ref 6.0–8.3)

## 2012-05-03 LAB — PROTIME-INR: Prothrombin Time: 12.8 seconds (ref 11.6–15.2)

## 2012-05-03 LAB — URINE MICROSCOPIC-ADD ON

## 2012-05-03 LAB — TYPE AND SCREEN
ABO/RH(D): A NEG
Antibody Screen: NEGATIVE

## 2012-05-03 SURGERY — CEREBRAL ANGIOGRAM
Anesthesia: LOCAL | Laterality: Bilateral

## 2012-05-03 MED ORDER — LIDOCAINE HCL (PF) 1 % IJ SOLN
INTRAMUSCULAR | Status: AC
  Filled 2012-05-03: qty 30

## 2012-05-03 MED ORDER — SODIUM CHLORIDE 0.9 % IV SOLN: 1.0000 mL/kg/h | INTRAVENOUS | Status: DC

## 2012-05-03 MED ORDER — ACETAMINOPHEN 325 MG PO TABS: 650.0000 mg | ORAL_TABLET | ORAL | Status: DC | PRN

## 2012-05-03 MED ORDER — SODIUM CHLORIDE 0.9 % IV SOLN
INTRAVENOUS | Status: DC
  Administered 2012-05-03: 08:00:00 via INTRAVENOUS

## 2012-05-03 MED ORDER — ONDANSETRON HCL 4 MG/2ML IJ SOLN: 4.0000 mg | Freq: Four times a day (QID) | INTRAMUSCULAR | Status: DC | PRN

## 2012-05-03 MED ORDER — HEPARIN (PORCINE) IN NACL 2-0.9 UNIT/ML-% IJ SOLN
INTRAMUSCULAR | Status: AC
  Filled 2012-05-03: qty 1000

## 2012-05-03 NOTE — Interval H&P Note (Signed)
History and Physical Interval Note:  05/03/2012 8:35 AM  Morgan Daniels  has presented today for surgery, with the diagnosis of Stenosis  The various methods of treatment have been discussed with the patient and family. After consideration of risks, benefits and other options for treatment, the patient has consented to  Procedure(s): CEREBRAL ANGIOGRAM (N/A) as a surgical intervention .  The patient's history has been reviewed, patient examined, no change in status, stable for surgery.  I have reviewed the patient's chart and labs.  Questions were answered to the patient's satisfaction.     DICKSON,CHRISTOPHER S

## 2012-05-03 NOTE — Op Note (Signed)
PATIENT: Morgan Daniels   MRN: [id number] DOB: [birth year]    DATE OF PROCEDURE: 05/03/2012  INDICATIONS: Morgan Daniels is a 52 y.o. female was hospitalized for stroke in December of 2013. She had developed the sudden onset of right upper cherry and right lower tree weakness and also expressive aphasia. Carotid duplex scan that showed only a mild stenosis. She also had an MRI which suggested infarcts in the right parietal lobe which were multifocal. She later developed a visual field cut on the left side. Given her carotid duplex showed only mild disease she is brought in for diagnostic cerebral arteriography.  PROCEDURE:  1. Ultrasound-guided access to the right common femoral artery 2. Arch aortogram 3. Selective innominate arteriogram 4. Selective right common carotid arteriogram with both intracranial and extracranial views. 5. Selective left common carotid arteriogram with both intracranial and extracranial views.   SURGEON: Di Kindle. Edilia Bo, MD, FACS  ANESTHESIA: local   EBL: minimal  TECHNIQUE: The patient was brought to the peripheral vascular lab. Both groins were prepped and draped in usual sterile fashion. After the skin was infiltrated with 1% lidocaine, and under ultrasound guidance, the right common femoral artery was cannulated and a guidewire introduced into the infrarenal aorta under fluoroscopic control. A 5 French sheath was introduced over the wire. A long pigtail catheter was positioned in the ascending aortic arch. An arch aortogram was obtained at a 40 LAO projection. The pigtail catheter was exchanged for an H1 catheter which was positioned into the innominate artery. Selective innominate arteriogram was obtained a 15 RAO projection. The wire was advanced into the common carotid artery and the catheter advanced into the common carotid artery and selective right common carotid arteriogram obtained with both intracranial and extracranial views. The intracranial views will  be interpreted separately by the neuroradiologist. Next the H1 catheter which was retracted into the arch was used to try to cannulate the left common carotid artery. This was not successful so exchange this catheter for a Berenstein 2 catheter. This was used to cannulate the left common carotid artery. The wire was advanced into the common carotid artery and the catheter advanced over the wire. Selective left common carotid arteriogram was obtained with both intracranial and extracranial views. At the completion of the procedure, the wire and catheter were removed. The patient was transferred to the holding area for removal of the sheath. No immediate complications were noted. The patient was neurologically intact.  FINDINGS:  1. The arch is widely patent without significant plaque or calcifications disease. 2. The innominate artery, right subclavian, right vertebral artery are patent. 3. The right common carotid artery is patent. There is a 40-50% stenosis in the proximal right internal carotid artery. There is no significant disease beyond that extracranially. The external carotid artery is patent. 4. The left subclavian and left vertebral artery are patent. 5. The left common carotid artery is patent. There is a calcific plaque within the proximal internal carotid artery. This produces an approximately 40-50% stenosis. This appears irregular.   Waverly Ferrari, MD, FACS [facility]  DATE OF DICTATION:   05/03/2012

## 2012-05-03 NOTE — Pre-Procedure Instructions (Signed)
SHELONDA SAXE  05/03/2012   Your procedure is scheduled on:  Thursday  05/06/12    Report to [facility] at 530  AM.  Call this number if you have problems the morning of surgery: [phone]   Remember:   Do not eat food or drink liquids after midnight.   Take these medicines the morning of surgery with A SIP OF WATER:  XANAX, BISOPROLOL (ZEBETA), PROZAC, NEURONTIN, PRILOSEC   Do not wear jewelry, make-up or nail polish.  Do not wear lotions, powders, or perfumes. You may wear deodorant.  Do not shave 48 hours prior to surgery. Men may shave face and neck.  Do not bring valuables to the hospital.  Contacts, dentures or bridgework may not be worn into surgery.  Leave suitcase in the car. After surgery it may be brought to your room.  For patients admitted to the hospital, checkout time is 11:00 AM the day of  discharge.   Patients discharged the day of surgery will not be allowed to drive  home.  Name and phone number of your driver:   Special Instructions: Shower using CHG 2 nights before surgery and the night before surgery.  If you shower the day of surgery use CHG.  Use special wash - you have one bottle of CHG for all showers.  You should use approximately 1/3 of the bottle for each shower.   Please read over the following fact sheets that you were given: Pain Booklet, Coughing and Deep Breathing, Blood Transfusion Information, MRSA Information and Surgical Site Infection Prevention

## 2012-05-03 NOTE — Progress Notes (Signed)
Requested stress test and echo if available from [facility] .

## 2012-05-03 NOTE — Progress Notes (Signed)
05/03/12 1312  OBSTRUCTIVE SLEEP APNEA  Have you ever been diagnosed with sleep apnea through a sleep study? No (patient was supposed to have sleep study, did not ZO:XWRU)  Do you snore loudly (loud enough to be heard through closed doors)?  1  Do you often feel tired, fatigued, or sleepy during the daytime? 1  Has anyone observed you stop breathing during your sleep? 0  Do you have, or are you being treated for high blood pressure? 1  BMI more than 35 kg/m2? 0  Age over 47 years old? 1  Gender: 0  Obstructive Sleep Apnea Score 4  Score 4 or greater  Results sent to PCP

## 2012-05-03 NOTE — Progress Notes (Signed)
Spoke with [name] at office, she stated patient did not need to stop aspirin or persantine.

## 2012-05-03 NOTE — H&P (View-Only) (Signed)
[facility] [place]  Patient name: Morgan Daniels MRN: [id number] DOB: [birth year] Sex: female  REASON FOR CONSULT: Symptomatic carotid disease. Referred by Dr. Murriel Hopper.  HPI: Morgan Daniels is a 52 y.o. female who was hospitalized for a stroke which occurred in December of 2013. At that time duplex scan suggested a 50% left internal carotid artery stenosis. She had developed the sudden onset of weakness in the right upper extremity and right lower extremity. He also had some expressive aphasia. She states that these symptoms lasted approximately 2 days. She had an MRI which interestingly showed changes in the right parietal lobe which were multifocal. She has been on aspirin and dipyridamole. She later developed some visual field changes with loss of vision in the upper field of the left eye. 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 He states that his symptoms were in the left eye however. She's had no further episodes of weakness appears seizures in her upper extremities were lower extremities.  She sent for vascular evaluation and a carotid duplex scan today.  She does have diabetes, hypertension, and hypercholesterolemia. She is on a statin (Lipitor). She denies any history of myocardial infarction or history of congestive heart failure. There is no family history of premature cardiovascular disease. She has smoked 1-1/2 packs per day of cigarettes for well over 30 years. She states that she is trying to quit currently.  Past Medical History  Diagnosis Date  . IBS (irritable bowel syndrome)   . Fibromyalgia   . Plantar fasciitis   . Fatty liver   . Diabetes   . Stroke Dec. 14,2013    Right side  . Carotid artery occlusion     Family History  Problem Relation Age of Onset  . Hypertension Mother   . Cancer Father   . Hypertension Maternal Grandmother     SOCIAL  HISTORY: History  Substance Use Topics  . Smoking status: Current Every Day Smoker  . Smokeless tobacco: Never Used  . Alcohol Use: Yes     Comment: once a month    Allergies  Allergen Reactions  . Simvastatin Diarrhea and Nausea And Vomiting  . Morphine And Related Itching    itching    Current Outpatient Prescriptions  Medication Sig Dispense Refill  . ALPRAZolam (XANAX) 0.5 MG tablet Take 0.5 mg by mouth 2 (two) times daily as needed.      [name] amitriptyline (ELAVIL) 100 MG tablet Take 100 mg by mouth at bedtime.      [name] atorvastatin (LIPITOR) 10 MG tablet Take 10 mg by mouth daily.      . bisoprolol (ZEBETA) 5 MG tablet Take 5 mg by mouth daily. Take 1/2 tab (2.5 mg) daily for BP      . Canagliflozin (INVOKANA) 100 MG TABS Take 100 mg by mouth daily.      . cetirizine (ZYRTEC) 10 MG tablet Take 10 mg by mouth daily.      . diphenhydramine-acetaminophen (TYLENOL PM) 25-500 MG TABS Take 1 tablet by mouth at bedtime as needed.      . dipyridamole (PERSANTINE) 75 MG tablet Take 1 tablet (75 mg total) by mouth 3 (three) times daily.  90 tablet  12  . FA-B6-B12-D-Omega 3-Phytoster (ANIMI-3/VITAMIN D PO) Take 5,000 Units by mouth daily.      [name] FLUoxetine (PROZAC) 40 MG capsule Take 40 mg by mouth daily.      [name]  gabapentin (NEURONTIN) 600 MG tablet Take 600 mg by mouth 2 (two) times daily.      [name] glimepiride (AMARYL) 4 MG tablet Take 4 mg by mouth daily after supper.      . hydrochlorothiazide (MICROZIDE) 12.5 MG capsule Take 12.5 mg by mouth daily.      . insulin detemir (LEVEMIR) 100 UNIT/ML injection Inject into the skin at bedtime.      . insulin lispro (HUMALOG) 100 UNIT/ML injection Inject 100 Units into the skin 3 (three) times daily before meals. Sliding scale prn      . omeprazole (PRILOSEC) 40 MG capsule Take 40 mg by mouth daily.      . ondansetron (ZOFRAN) 8 MG tablet Take by mouth every 8 (eight) hours as needed for nausea.       No current facility-administered medications  for this visit.    REVIEW OF SYSTEMS: Arly.Keller ] denotes positive finding; [  ] denotes negative finding  CARDIOVASCULAR:  [ ]  chest pain   [ ]  chest pressure   [ ]  palpitations   [ ]  orthopnea   [ ]  dyspnea on exertion   [ ]  claudication   [ ]  rest pain   [ ]  DVT   [ ]  phlebitis PULMONARY:   [ ]  productive cough   [ ]  asthma   [ ]  wheezing NEUROLOGIC:   Arly.Keller ] weakness  [ ]  paresthesias  [ ]  aphasia  Arly.Keller ] amaurosis  [ ]  dizziness HEMATOLOGIC:   [ ]  bleeding problems   [ ]  clotting disorders MUSCULOSKELETAL:  [ ]  joint pain   [ ]  joint swelling [ ]  leg swelling GASTROINTESTINAL: [ ]   blood in stool  [ ]   hematemesis GENITOURINARY:  [ ]   dysuria  [ ]   hematuria PSYCHIATRIC:  [ ]  history of major depression INTEGUMENTARY:  [ ]  rashes  [ ]  ulcers CONSTITUTIONAL:  [ ]  fever   [ ]  chills  PHYSICAL EXAM: Filed Vitals:   04/28/12 1350 04/28/12 1354  BP: 124/88 120/79  Pulse: 87 90  Resp: 16   Height: 5\' 7"  (1.702 m)   Weight: 202 lb (91.627 kg)   SpO2: 97% 100%   Body mass index is 31.63 kg/(m^2). GENERAL: The patient is a well-nourished female, in no acute distress. The vital signs are documented above. CARDIOVASCULAR: There is a regular rate and rhythm. I do not detect carotid bruits. She has palpable femoral pulses. PULMONARY: There is good air exchange bilaterally without wheezing or rales. ABDOMEN: Soft and non-tender with normal pitched bowel sounds.  MUSCULOSKELETAL: There are no major deformities or cyanosis. NEUROLOGIC: No focal weakness or paresthesias are detected. SKIN: There are no ulcers or rashes noted. PSYCHIATRIC: The patient has a normal affect.  DATA:  I have independently interpreted the carotid duplex scan in our office today which shows evidence of a 40-59% right internal carotid artery stenosis and a less than 40% left internal carotid artery stenosis. Vertebral arteries are patent with antegrade flow.  I have reviewed her records from [facility] in [place]. On rectal exam she had emboli noted in the right eye. I have also reviewed her records sent from Dr. Hyacinth Meeker office.  MEDICAL ISSUES: SYMPTOMATIC BILATERAL CAROTID DISEASE: This patient has only mild disease on carotid duplex scan. There is a 40-59% right carotid stenosis with a less than 40% left carotid stenosis. Given that she had right-sided weakness I would be most suspicious of the stenosis on the left  being symptomatic although MRI did show evidence of disease on the right. Visual disturbance was also on the left. Given that there is no significant stenosis noted on duplex on the left, I have recommended cerebral arteriography in order to look for ulceration on the left which might explain her left hemispheric symptoms. Likewise we can further evaluate the stenosis on the right is given the MRI findings this could potentially be symptomatic also. I've explained that she has a smooth mild stenosis then I do not think she would benefit from carotid endarterectomy. However if she had an ulcerated plaque or a more significant narrowing then consideration could be given to carotid endarterectomy. I have discussed the indications for cerebral arteriography. I've also discussed the potential complications of the procedure, including but not limited to: Bleeding, arterial injury, stroke (1%. Procedural risk), renal insufficiency, or other unpredictable medical problems. All the patient's questions were answered and they are agreeable to proceed. We will make further recommendations pending the results of her cerebral arteriogram. In the meantime she knows to continue taking her aspirin. Also discussed the importance of tobacco cessation. I have discussed with the patient the nature of atherosclerosis, and emphasized the importance of maximal medical management including control of blood pressure, blood glucose, and cholesterol levels, antiplatelet agents, obtaining regular exercise, and cessation of  smoking. The patient is aware that without maximal medical management the underlying atherosclerotic disease process will progress and also limit the benefit of any interventions.  DICKSON,CHRISTOPHER S [facility] [place]: [phone]

## 2012-05-04 ENCOUNTER — Other Ambulatory Visit: Payer: Self-pay | Admitting: *Deleted

## 2012-05-04 ENCOUNTER — Encounter (HOSPITAL_COMMUNITY): Payer: Self-pay | Admitting: Pharmacy Technician

## 2012-05-04 LAB — URINE CULTURE

## 2012-05-04 MED ORDER — CIPROFLOXACIN HCL 500 MG PO TABS: 500.0000 mg | ORAL_TABLET | Freq: Two times a day (BID) | ORAL | Status: DC

## 2012-05-04 NOTE — Progress Notes (Signed)
Anesthesia Chart Review:  Patient is a 52 year old female scheduled for left carotid endarterectomy for symptomatic carotid disease on 05/06/12 by Dr. Edilia Bo.  She was hospitalized at [facility] in December 2013 for CVA with right sided hemiparesis, expressive dysphagia, visual field cut in there left eye.  Notes indicate that MRI showed multifocal changes in the right parietal lobe and ophthalmologic exam showed retinal microemboli in the right eye.    Other history includes smoking, obesity, IBS, DM2, anxiety, depression, GERD, hiatal hernia, fatty liver, HTN, fibromyalgia, CVA 01/2012, back surgery, hernia repair, hysterectomy, cholecystectomy.  She is scheduled to have a sleep study, but this has not been done yet.  Neurologist is Dr. Murriel Hopper.  EKG on 05/03/12 showed NSR, anterior infarct (age undetermined), anterior T wave abnormality, consider ischemia.  Anterior T wave abnormality appears new since 01/31/12.  Echo on 02/01/12 [facility]) showed normal Lv systolic function, EF > 55%, mild concentric LVH, no Doppler flow pattern suggestive of impaired LV relaxation. No Doppler evidence of ASD. Normal RV systolic function, normal RVSP. Trace TR, trace PR.  Nuclear stress test on 10/01/10 [facility]) showed: Negative ETT though sensitivity limited by inability to achieve 85% of maximum predicted HR, normal LV function with EF 59%, normal wall motion, normal sestamibi scintigraphy without evidence for scar or ischemia.    By notes, carotid duplex at [facility] on 04/28/12 showed evidence of a 40-59% right internal carotid artery stenosis and a less than 40% left internal carotid artery stenosis. Vertebral arteries are patent with antegrade flow.  Carotid arteriogram on 05/03/12 showed: 1. The arch is widely patent without significant plaque or calcifications disease.  2. The innominate artery, right subclavian, right vertebral artery are patent.  3. The right common carotid artery is patent. There is a 40-50% stenosis in  the proximal right internal carotid artery. There is no significant disease beyond that extracranially. The external carotid artery is patent.  4. The left subclavian and left vertebral artery are patent.  5. The left common carotid artery is patent. There is a calcific plaque within the proximal internal carotid artery. This produces an approximately 40-50% stenosis. This appears irregular.   CXR on 05/03/12 showed no active cardiopulmonary disease.  Preoperative labs noted.  Non-fasting glucose was 277.  She will get a CBG on arrival. Urine culture showed > 100,000 E. Coli.  Culture results routed to Dr. Edilia Bo and [facility] nurses Darel Hong and Okey Regal.   I reviewed history, EKGs, prior cardiac testing with anesthesiologist Dr. Michelle Piper.  Patient has symptomatic carotid artery disease.  If she has no acute CV symptoms then could likely proceed as planned.  She will be evaluated by her assigned anesthesiologist on the day of surgery.  Velna Ochs [facility]/Anesthesiology Phone [phone] 05/04/2012 2:13 PM

## 2012-05-05 ENCOUNTER — Other Ambulatory Visit: Payer: Self-pay

## 2012-05-05 MED ORDER — DEXTROSE 5 % IV SOLN
1.5000 g | INTRAVENOUS | Status: AC
  Administered 2012-05-06: 1.5 g via INTRAVENOUS
  Filled 2012-05-05: qty 1.5

## 2012-05-06 ENCOUNTER — Inpatient Hospital Stay (HOSPITAL_COMMUNITY)
Admission: RE | Admit: 2012-05-06 | Discharge: 2012-05-07 | DRG: 838 | Disposition: A | Discharge status: Home / Self Care | Payer: BC Managed Care – PPO | Source: Ambulatory Visit | Attending: Vascular Surgery | Admitting: Vascular Surgery

## 2012-05-06 ENCOUNTER — Encounter (HOSPITAL_COMMUNITY): Payer: Self-pay | Admitting: Vascular Surgery

## 2012-05-06 ENCOUNTER — Ambulatory Visit (HOSPITAL_COMMUNITY): Payer: BC Managed Care – PPO | Admitting: Certified Registered Nurse Anesthetist

## 2012-05-06 ENCOUNTER — Encounter (HOSPITAL_COMMUNITY): Payer: Self-pay | Admitting: *Deleted

## 2012-05-06 ENCOUNTER — Encounter (HOSPITAL_COMMUNITY)
Admission: RE | Disposition: A | Discharge status: Home / Self Care | Payer: Self-pay | Source: Ambulatory Visit | Attending: Vascular Surgery

## 2012-05-06 DIAGNOSIS — K219 Gastro-esophageal reflux disease without esophagitis: Secondary | ICD-10-CM | POA: Diagnosis present

## 2012-05-06 DIAGNOSIS — IMO0001 Reserved for inherently not codable concepts without codable children: Secondary | ICD-10-CM | POA: Diagnosis present

## 2012-05-06 DIAGNOSIS — I10 Essential (primary) hypertension: Secondary | ICD-10-CM | POA: Diagnosis present

## 2012-05-06 DIAGNOSIS — Z792 Long term (current) use of antibiotics: Secondary | ICD-10-CM

## 2012-05-06 DIAGNOSIS — E669 Obesity, unspecified: Secondary | ICD-10-CM | POA: Diagnosis present

## 2012-05-06 DIAGNOSIS — I6529 Occlusion and stenosis of unspecified carotid artery: Secondary | ICD-10-CM

## 2012-05-06 DIAGNOSIS — F411 Generalized anxiety disorder: Secondary | ICD-10-CM | POA: Diagnosis present

## 2012-05-06 DIAGNOSIS — K589 Irritable bowel syndrome without diarrhea: Secondary | ICD-10-CM | POA: Diagnosis present

## 2012-05-06 DIAGNOSIS — F17200 Nicotine dependence, unspecified, uncomplicated: Secondary | ICD-10-CM | POA: Diagnosis present

## 2012-05-06 DIAGNOSIS — K449 Diaphragmatic hernia without obstruction or gangrene: Secondary | ICD-10-CM | POA: Diagnosis present

## 2012-05-06 DIAGNOSIS — Z885 Allergy status to narcotic agent status: Secondary | ICD-10-CM

## 2012-05-06 DIAGNOSIS — Z8673 Personal history of transient ischemic attack (TIA), and cerebral infarction without residual deficits: Secondary | ICD-10-CM

## 2012-05-06 DIAGNOSIS — Z6833 Body mass index (BMI) 33.0-33.9, adult: Secondary | ICD-10-CM

## 2012-05-06 DIAGNOSIS — N390 Urinary tract infection, site not specified: Secondary | ICD-10-CM | POA: Diagnosis present

## 2012-05-06 DIAGNOSIS — Z23 Encounter for immunization: Secondary | ICD-10-CM

## 2012-05-06 DIAGNOSIS — F3289 Other specified depressive episodes: Secondary | ICD-10-CM | POA: Diagnosis present

## 2012-05-06 DIAGNOSIS — I658 Occlusion and stenosis of other precerebral arteries: Secondary | ICD-10-CM | POA: Diagnosis present

## 2012-05-06 DIAGNOSIS — Z794 Long term (current) use of insulin: Secondary | ICD-10-CM

## 2012-05-06 DIAGNOSIS — Z888 Allergy status to other drugs, medicaments and biological substances status: Secondary | ICD-10-CM

## 2012-05-06 DIAGNOSIS — K7689 Other specified diseases of liver: Secondary | ICD-10-CM | POA: Diagnosis present

## 2012-05-06 DIAGNOSIS — Z79899 Other long term (current) drug therapy: Secondary | ICD-10-CM

## 2012-05-06 DIAGNOSIS — E119 Type 2 diabetes mellitus without complications: Secondary | ICD-10-CM | POA: Diagnosis present

## 2012-05-06 HISTORY — PX: ENDARTERECTOMY: SHX5162

## 2012-05-06 HISTORY — PX: PATCH ANGIOPLASTY: SHX6230

## 2012-05-06 HISTORY — PX: CAROTID ENDARTERECTOMY: SUR193

## 2012-05-06 LAB — CBC
HCT: 38.9 % (ref 36.0–46.0)
Hemoglobin: 13.2 g/dL (ref 12.0–15.0)
MCH: 28.2 pg (ref 26.0–34.0)
MCHC: 33.9 g/dL (ref 30.0–36.0)
MCV: 83.1 fL (ref 78.0–100.0)

## 2012-05-06 LAB — GLUCOSE, CAPILLARY
Glucose-Capillary: 164 mg/dL — ABNORMAL HIGH (ref 70–99)
Glucose-Capillary: 229 mg/dL — ABNORMAL HIGH (ref 70–99)

## 2012-05-06 SURGERY — ENDARTERECTOMY, CAROTID
Anesthesia: General | Site: Neck | Laterality: Left | Wound class: Clean

## 2012-05-06 MED ORDER — ALPRAZOLAM 0.5 MG PO TABS
0.5000 mg | ORAL_TABLET | Freq: Two times a day (BID) | ORAL | Status: DC
  Administered 2012-05-06 – 2012-05-07 (×2): 0.5 mg via ORAL
  Filled 2012-05-06 (×2): qty 1

## 2012-05-06 MED ORDER — GLIMEPIRIDE 4 MG PO TABS
4.0000 mg | ORAL_TABLET | Freq: Every day | ORAL | Status: DC
  Administered 2012-05-06: 4 mg via ORAL
  Filled 2012-05-06 (×2): qty 1

## 2012-05-06 MED ORDER — GABAPENTIN 600 MG PO TABS
600.0000 mg | ORAL_TABLET | Freq: Two times a day (BID) | ORAL | Status: DC
  Administered 2012-05-06 – 2012-05-07 (×3): 600 mg via ORAL
  Filled 2012-05-06 (×4): qty 1

## 2012-05-06 MED ORDER — INSULIN ASPART 100 UNIT/ML ~~LOC~~ SOLN
0.0000 [IU] | SUBCUTANEOUS | Status: DC
  Administered 2012-05-06 (×3): 7 [IU] via SUBCUTANEOUS
  Administered 2012-05-07: 4 [IU] via SUBCUTANEOUS
  Administered 2012-05-07: 7 [IU] via SUBCUTANEOUS
  Administered 2012-05-07: 4 [IU] via SUBCUTANEOUS

## 2012-05-06 MED ORDER — DOPAMINE-DEXTROSE 3.2-5 MG/ML-% IV SOLN: 3.0000 ug/kg/min | INTRAVENOUS | Status: DC

## 2012-05-06 MED ORDER — LACTATED RINGERS IV SOLN
INTRAVENOUS | Status: DC | PRN
  Administered 2012-05-06 (×2): via INTRAVENOUS

## 2012-05-06 MED ORDER — PROTAMINE SULFATE 10 MG/ML IV SOLN
INTRAVENOUS | Status: DC | PRN
  Administered 2012-05-06 (×4): 10 mg via INTRAVENOUS

## 2012-05-06 MED ORDER — ALUM & MAG HYDROXIDE-SIMETH 200-200-20 MG/5ML PO SUSP: 15.0000 mL | ORAL | Status: DC | PRN

## 2012-05-06 MED ORDER — SODIUM CHLORIDE 0.9 % IV SOLN
10.0000 mg | INTRAVENOUS | Status: DC | PRN
  Administered 2012-05-06: 40 ug/min via INTRAVENOUS

## 2012-05-06 MED ORDER — LABETALOL HCL 5 MG/ML IV SOLN: 10.0000 mg | INTRAVENOUS | Status: DC | PRN

## 2012-05-06 MED ORDER — PANTOPRAZOLE SODIUM 40 MG PO TBEC
80.0000 mg | DELAYED_RELEASE_TABLET | Freq: Every day | ORAL | Status: DC
  Administered 2012-05-06 – 2012-05-07 (×2): 80 mg via ORAL
  Filled 2012-05-06 (×2): qty 2
  Filled 2012-05-06: qty 1

## 2012-05-06 MED ORDER — LIDOCAINE HCL (PF) 1 % IJ SOLN
INTRAMUSCULAR | Status: AC
  Filled 2012-05-06: qty 30

## 2012-05-06 MED ORDER — GLYCOPYRROLATE 0.2 MG/ML IJ SOLN
INTRAMUSCULAR | Status: DC | PRN
  Administered 2012-05-06: 0.6 mg via INTRAVENOUS

## 2012-05-06 MED ORDER — SENNOSIDES-DOCUSATE SODIUM 8.6-50 MG PO TABS
1.0000 | ORAL_TABLET | Freq: Every evening | ORAL | Status: DC | PRN
  Filled 2012-05-06: qty 1

## 2012-05-06 MED ORDER — ENOXAPARIN SODIUM 40 MG/0.4ML ~~LOC~~ SOLN
40.0000 mg | SUBCUTANEOUS | Status: DC
  Administered 2012-05-07: 40 mg via SUBCUTANEOUS
  Filled 2012-05-06 (×2): qty 0.4

## 2012-05-06 MED ORDER — ACETAMINOPHEN 325 MG PO TABS: 325.0000 mg | ORAL_TABLET | ORAL | Status: DC | PRN

## 2012-05-06 MED ORDER — DEXTRAN 40 IN SALINE 10-0.9 % IV SOLN
INTRAVENOUS | Status: DC | PRN
  Administered 2012-05-06: 500 mL

## 2012-05-06 MED ORDER — NICOTINE 21 MG/24HR TD PT24
21.0000 mg | MEDICATED_PATCH | TRANSDERMAL | Status: DC
  Administered 2012-05-07: 21 mg via TRANSDERMAL
  Filled 2012-05-06: qty 1

## 2012-05-06 MED ORDER — LIDOCAINE-EPINEPHRINE (PF) 1 %-1:200000 IJ SOLN
INTRAMUSCULAR | Status: DC | PRN
  Administered 2012-05-06: 10 mL

## 2012-05-06 MED ORDER — THROMBIN 20000 UNITS EX SOLR
CUTANEOUS | Status: AC
  Filled 2012-05-06: qty 20000

## 2012-05-06 MED ORDER — ASPIRIN EC 81 MG PO TBEC
81.0000 mg | DELAYED_RELEASE_TABLET | Freq: Every day | ORAL | Status: DC
  Filled 2012-05-06: qty 1

## 2012-05-06 MED ORDER — INSULIN DETEMIR 100 UNIT/ML ~~LOC~~ SOLN
50.0000 [IU] | Freq: Every day | SUBCUTANEOUS | Status: DC
  Administered 2012-05-06: 50 [IU] via SUBCUTANEOUS
  Filled 2012-05-06 (×2): qty 0.5

## 2012-05-06 MED ORDER — HYDROMORPHONE HCL PF 1 MG/ML IJ SOLN
0.5000 mg | INTRAMUSCULAR | Status: DC | PRN
  Administered 2012-05-06 – 2012-05-07 (×3): 1 mg via INTRAVENOUS
  Filled 2012-05-06 (×3): qty 1

## 2012-05-06 MED ORDER — PHENOL 1.4 % MT LIQD
1.0000 | OROMUCOSAL | Status: DC | PRN
  Filled 2012-05-06: qty 177

## 2012-05-06 MED ORDER — 0.9 % SODIUM CHLORIDE (POUR BTL) OPTIME
TOPICAL | Status: DC | PRN
  Administered 2012-05-06: 2000 mL

## 2012-05-06 MED ORDER — ONDANSETRON HCL 8 MG PO TABS
8.0000 mg | ORAL_TABLET | Freq: Two times a day (BID) | ORAL | Status: DC | PRN
  Filled 2012-05-06: qty 1

## 2012-05-06 MED ORDER — LIDOCAINE HCL 4 % MT SOLN
OROMUCOSAL | Status: DC | PRN
  Administered 2012-05-06: 4 mL via TOPICAL

## 2012-05-06 MED ORDER — NEOSTIGMINE METHYLSULFATE 1 MG/ML IJ SOLN
INTRAMUSCULAR | Status: DC | PRN
  Administered 2012-05-06: 4 mg via INTRAVENOUS

## 2012-05-06 MED ORDER — DEXTROSE 5 % IV SOLN
1.5000 g | Freq: Two times a day (BID) | INTRAVENOUS | Status: AC
  Administered 2012-05-06 – 2012-05-07 (×2): 1.5 g via INTRAVENOUS
  Filled 2012-05-06 (×2): qty 1.5

## 2012-05-06 MED ORDER — DOCUSATE SODIUM 100 MG PO CAPS
100.0000 mg | ORAL_CAPSULE | Freq: Every day | ORAL | Status: DC
  Administered 2012-05-07: 100 mg via ORAL
  Filled 2012-05-06: qty 1

## 2012-05-06 MED ORDER — ONDANSETRON HCL 4 MG/2ML IJ SOLN: 4.0000 mg | Freq: Four times a day (QID) | INTRAMUSCULAR | Status: DC | PRN

## 2012-05-06 MED ORDER — LIDOCAINE-EPINEPHRINE (PF) 1 %-1:200000 IJ SOLN
INTRAMUSCULAR | Status: AC
  Filled 2012-05-06: qty 10

## 2012-05-06 MED ORDER — ROCURONIUM BROMIDE 100 MG/10ML IV SOLN
INTRAVENOUS | Status: DC | PRN
  Administered 2012-05-06: 50 mg via INTRAVENOUS

## 2012-05-06 MED ORDER — SODIUM CHLORIDE 0.9 % IR SOLN
Status: DC | PRN
  Administered 2012-05-06: 08:00:00

## 2012-05-06 MED ORDER — ONDANSETRON HCL 4 MG/2ML IJ SOLN
INTRAMUSCULAR | Status: DC | PRN
  Administered 2012-05-06: 4 mg via INTRAVENOUS

## 2012-05-06 MED ORDER — ARTIFICIAL TEARS OP OINT
TOPICAL_OINTMENT | OPHTHALMIC | Status: DC | PRN
  Administered 2012-05-06: 1 via OPHTHALMIC

## 2012-05-06 MED ORDER — CIPROFLOXACIN HCL 500 MG PO TABS
500.0000 mg | ORAL_TABLET | Freq: Two times a day (BID) | ORAL | Status: DC
  Administered 2012-05-06 – 2012-05-07 (×3): 500 mg via ORAL
  Filled 2012-05-06 (×5): qty 1

## 2012-05-06 MED ORDER — METOPROLOL TARTRATE 1 MG/ML IV SOLN: 2.0000 mg | INTRAVENOUS | Status: DC | PRN

## 2012-05-06 MED ORDER — DIPYRIDAMOLE 75 MG PO TABS
75.0000 mg | ORAL_TABLET | Freq: Three times a day (TID) | ORAL | Status: DC
  Administered 2012-05-06 – 2012-05-07 (×3): 75 mg via ORAL
  Filled 2012-05-06 (×5): qty 1

## 2012-05-06 MED ORDER — ASPIRIN EC 325 MG PO TBEC
325.0000 mg | DELAYED_RELEASE_TABLET | Freq: Every day | ORAL | Status: DC
  Administered 2012-05-06 – 2012-05-07 (×2): 325 mg via ORAL
  Filled 2012-05-06 (×2): qty 1

## 2012-05-06 MED ORDER — NICOTINE 21 MG/24HR TD PT24
21.0000 mg | MEDICATED_PATCH | Freq: Once | TRANSDERMAL | Status: AC
  Administered 2012-05-06: 21 mg via TRANSDERMAL
  Filled 2012-05-06: qty 1

## 2012-05-06 MED ORDER — CANAGLIFLOZIN 100 MG PO TABS: 100.0000 mg | ORAL_TABLET | Freq: Every day | ORAL | Status: DC

## 2012-05-06 MED ORDER — ATORVASTATIN CALCIUM 10 MG PO TABS
10.0000 mg | ORAL_TABLET | Freq: Every day | ORAL | Status: DC
  Administered 2012-05-06: 10 mg via ORAL
  Filled 2012-05-06 (×2): qty 1

## 2012-05-06 MED ORDER — HEPARIN SODIUM (PORCINE) 1000 UNIT/ML IJ SOLN
INTRAMUSCULAR | Status: DC | PRN
  Administered 2012-05-06: 9000 [IU] via INTRAVENOUS

## 2012-05-06 MED ORDER — PROPOFOL 10 MG/ML IV BOLUS
INTRAVENOUS | Status: DC | PRN
  Administered 2012-05-06: 130 mg via INTRAVENOUS

## 2012-05-06 MED ORDER — HYDRALAZINE HCL 20 MG/ML IJ SOLN: 10.0000 mg | INTRAMUSCULAR | Status: DC | PRN

## 2012-05-06 MED ORDER — ACETAMINOPHEN 10 MG/ML IV SOLN: 1000.0000 mg | Freq: Once | INTRAVENOUS | Status: DC | PRN

## 2012-05-06 MED ORDER — POTASSIUM CHLORIDE CRYS ER 20 MEQ PO TBCR: 20.0000 meq | EXTENDED_RELEASE_TABLET | Freq: Once | ORAL | Status: AC | PRN

## 2012-05-06 MED ORDER — MAGNESIUM SULFATE 40 MG/ML IJ SOLN
2.0000 g | Freq: Once | INTRAMUSCULAR | Status: AC | PRN
  Filled 2012-05-06: qty 50

## 2012-05-06 MED ORDER — MIDAZOLAM HCL 5 MG/5ML IJ SOLN
INTRAMUSCULAR | Status: DC | PRN
  Administered 2012-05-06: 2 mg via INTRAVENOUS

## 2012-05-06 MED ORDER — DEXAMETHASONE SODIUM PHOSPHATE 4 MG/ML IJ SOLN
INTRAMUSCULAR | Status: DC | PRN
  Administered 2012-05-06: 8 mg via INTRAVENOUS

## 2012-05-06 MED ORDER — DEXTRAN 40 IN SALINE 10-0.9 % IV SOLN
INTRAVENOUS | Status: AC
  Filled 2012-05-06: qty 500

## 2012-05-06 MED ORDER — PANTOPRAZOLE SODIUM 40 MG PO TBEC: 40.0000 mg | DELAYED_RELEASE_TABLET | Freq: Every day | ORAL | Status: DC

## 2012-05-06 MED ORDER — GUAIFENESIN-DM 100-10 MG/5ML PO SYRP: 15.0000 mL | ORAL_SOLUTION | ORAL | Status: DC | PRN

## 2012-05-06 MED ORDER — ACETAMINOPHEN 325 MG RE SUPP
325.0000 mg | RECTAL | Status: DC | PRN
  Filled 2012-05-06: qty 2

## 2012-05-06 MED ORDER — HYDROMORPHONE HCL PF 1 MG/ML IJ SOLN: 0.2500 mg | INTRAMUSCULAR | Status: DC | PRN

## 2012-05-06 MED ORDER — AMITRIPTYLINE HCL 100 MG PO TABS
100.0000 mg | ORAL_TABLET | Freq: Every day | ORAL | Status: DC
  Administered 2012-05-06: 100 mg via ORAL
  Filled 2012-05-06 (×2): qty 1

## 2012-05-06 MED ORDER — BISOPROLOL FUMARATE 5 MG PO TABS
2.5000 mg | ORAL_TABLET | Freq: Every day | ORAL | Status: DC
  Filled 2012-05-06 (×2): qty 0.5

## 2012-05-06 MED ORDER — HYDROCHLOROTHIAZIDE 12.5 MG PO CAPS
12.5000 mg | ORAL_CAPSULE | Freq: Every day | ORAL | Status: DC
  Administered 2012-05-07: 12.5 mg via ORAL
  Filled 2012-05-06 (×2): qty 1

## 2012-05-06 MED ORDER — INSULIN DETEMIR 100 UNIT/ML ~~LOC~~ SOLN: 50.0000 [IU] | Freq: Every day | SUBCUTANEOUS | Status: DC

## 2012-05-06 MED ORDER — SODIUM CHLORIDE 0.9 % IV SOLN: INTRAVENOUS | Status: DC

## 2012-05-06 MED ORDER — VECURONIUM BROMIDE 10 MG IV SOLR
INTRAVENOUS | Status: DC | PRN
  Administered 2012-05-06: 2 mg via INTRAVENOUS

## 2012-05-06 MED ORDER — ONDANSETRON HCL 4 MG/2ML IJ SOLN: 4.0000 mg | Freq: Once | INTRAMUSCULAR | Status: DC | PRN

## 2012-05-06 MED ORDER — FLUOXETINE HCL 20 MG PO TABS
40.0000 mg | ORAL_TABLET | Freq: Every day | ORAL | Status: DC
  Administered 2012-05-06 – 2012-05-07 (×2): 40 mg via ORAL
  Filled 2012-05-06 (×2): qty 2

## 2012-05-06 MED ORDER — LIDOCAINE HCL (CARDIAC) 20 MG/ML IV SOLN
INTRAVENOUS | Status: DC | PRN
  Administered 2012-05-06: 25 mg via INTRAVENOUS

## 2012-05-06 MED ORDER — LORATADINE 10 MG PO TABS
10.0000 mg | ORAL_TABLET | Freq: Every day | ORAL | Status: DC
  Administered 2012-05-06 – 2012-05-07 (×2): 10 mg via ORAL
  Filled 2012-05-06 (×2): qty 1

## 2012-05-06 MED ORDER — FENTANYL CITRATE 0.05 MG/ML IJ SOLN
INTRAMUSCULAR | Status: DC | PRN
  Administered 2012-05-06: 100 ug via INTRAVENOUS
  Administered 2012-05-06: 50 ug via INTRAVENOUS
  Administered 2012-05-06: 100 ug via INTRAVENOUS
  Administered 2012-05-06: 50 ug via INTRAVENOUS

## 2012-05-06 MED ORDER — OXYCODONE HCL 5 MG PO TABS
5.0000 mg | ORAL_TABLET | ORAL | Status: DC | PRN
  Administered 2012-05-06: 5 mg via ORAL
  Administered 2012-05-07: 10 mg via ORAL
  Filled 2012-05-06: qty 1
  Filled 2012-05-06: qty 2

## 2012-05-06 MED ORDER — SODIUM CHLORIDE 0.9 % IV SOLN
500.0000 mL | Freq: Once | INTRAVENOUS | Status: AC | PRN
  Administered 2012-05-06: 500 mL via INTRAVENOUS

## 2012-05-06 SURGICAL SUPPLY — 57 items
ADH SKN CLS APL DERMABOND .7 (GAUZE/BANDAGES/DRESSINGS) ×1
BAG DECANTER FOR FLEXI CONT (MISCELLANEOUS) ×2 IMPLANT
CANISTER SUCTION 2500CC (MISCELLANEOUS) ×2 IMPLANT
CANNULA VESSEL W/WING WO/VALVE (CANNULA) ×2 IMPLANT
CATH ROBINSON RED A/P 18FR (CATHETERS) ×1 IMPLANT
CLIP TI MEDIUM 24 (CLIP) ×2 IMPLANT
CLIP TI WIDE RED SMALL 24 (CLIP) ×2 IMPLANT
CLOTH BEACON ORANGE TIMEOUT ST (SAFETY) ×2 IMPLANT
COVER SURGICAL LIGHT HANDLE (MISCELLANEOUS) ×3 IMPLANT
CRADLE DONUT ADULT HEAD (MISCELLANEOUS) ×2 IMPLANT
DERMABOND ADVANCED (GAUZE/BANDAGES/DRESSINGS) ×1
DERMABOND ADVANCED .7 DNX12 (GAUZE/BANDAGES/DRESSINGS) ×1 IMPLANT
DRAIN CHANNEL 15F RND FF W/TCR (WOUND CARE) IMPLANT
DRAPE WARM FLUID 44X44 (DRAPE) ×2 IMPLANT
ELECT REM PT RETURN 9FT ADLT (ELECTROSURGICAL) ×2
ELECTRODE REM PT RTRN 9FT ADLT (ELECTROSURGICAL) ×1 IMPLANT
EVACUATOR SILICONE 100CC (DRAIN) IMPLANT
GLOVE BIO SURGEON STRL SZ7.5 (GLOVE) ×1 IMPLANT
GLOVE BIOGEL PI IND STRL 6.5 (GLOVE) IMPLANT
GLOVE BIOGEL PI IND STRL 7.0 (GLOVE) IMPLANT
GLOVE BIOGEL PI IND STRL 7.5 (GLOVE) IMPLANT
GLOVE BIOGEL PI IND STRL 8 (GLOVE) ×1 IMPLANT
GLOVE BIOGEL PI INDICATOR 6.5 (GLOVE) ×3
GLOVE BIOGEL PI INDICATOR 7.0 (GLOVE) ×1
GLOVE BIOGEL PI INDICATOR 7.5 (GLOVE) ×1
GLOVE BIOGEL PI INDICATOR 8 (GLOVE) ×1
GLOVE SURG SS PI 6.5 STRL IVOR (GLOVE) ×1 IMPLANT
GLOVE SURG SS PI 7.0 STRL IVOR (GLOVE) ×3 IMPLANT
GLOVE SURG SS PI 7.5 STRL IVOR (GLOVE) ×1 IMPLANT
GOWN STRL NON-REIN LRG LVL3 (GOWN DISPOSABLE) ×5 IMPLANT
GOWN STRL REIN XL XLG (GOWN DISPOSABLE) ×1 IMPLANT
KIT BASIN OR (CUSTOM PROCEDURE TRAY) ×2 IMPLANT
KIT ROOM TURNOVER OR (KITS) ×2 IMPLANT
KIT SUCTION CATH 14FR (SUCTIONS) ×1 IMPLANT
NDL HYPO 25X1 1.5 SAFETY (NEEDLE) ×1 IMPLANT
NEEDLE HYPO 25X1 1.5 SAFETY (NEEDLE) ×2 IMPLANT
NS IRRIG 1000ML POUR BTL (IV SOLUTION) ×4 IMPLANT
PACK CAROTID (CUSTOM PROCEDURE TRAY) ×2 IMPLANT
PAD ARMBOARD 7.5X6 YLW CONV (MISCELLANEOUS) ×4 IMPLANT
PATCH HEMASHIELD 8X75 (Vascular Products) ×1 IMPLANT
SHUNT CAROTID BYPASS 10 (VASCULAR PRODUCTS) IMPLANT
SHUNT CAROTID BYPASS 12 (VASCULAR PRODUCTS) ×1 IMPLANT
SHUNT CAROTID BYPASS 12FRX15.5 (VASCULAR PRODUCTS) IMPLANT
SPECIMEN JAR SMALL (MISCELLANEOUS) ×1 IMPLANT
SPONGE SURGIFOAM ABS GEL 100 (HEMOSTASIS) IMPLANT
SUT PROLENE 6 0 BV (SUTURE) ×2 IMPLANT
SUT PROLENE 7 0 BV 1 (SUTURE) IMPLANT
SUT SILK 2 0 FS (SUTURE) IMPLANT
SUT SILK 3 0 TIES 17X18 (SUTURE)
SUT SILK 3-0 18XBRD TIE BLK (SUTURE) IMPLANT
SUT VIC AB 3-0 SH 27 (SUTURE) ×2
SUT VIC AB 3-0 SH 27X BRD (SUTURE) ×1 IMPLANT
SUT VICRYL 4-0 PS2 18IN ABS (SUTURE) ×2 IMPLANT
SYR CONTROL 10ML LL (SYRINGE) ×2 IMPLANT
TOWEL OR 17X24 6PK STRL BLUE (TOWEL DISPOSABLE) ×2 IMPLANT
TOWEL OR 17X26 10 PK STRL BLUE (TOWEL DISPOSABLE) ×2 IMPLANT
WATER STERILE IRR 1000ML POUR (IV SOLUTION) ×2 IMPLANT

## 2012-05-06 NOTE — Op Note (Signed)
NAME: Morgan Daniels   MRN: [id number] DOB: [birth year]    DATE OF OPERATION: 05/06/2012  PREOP DIAGNOSIS: symptomatic left carotid stenosis  POSTOP DIAGNOSIS: same  PROCEDURE: left carotid endarterectomy with Dacron patch angioplasty  SURGEON: Di Kindle. Edilia Bo, MD, FACS  ASSIST: Lianne Cure PA  ANESTHESIA: Gen.   EBL: minimal  INDICATIONS: Morgan Daniels is a 52 y.o. 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 This was a left cortical stroke. Duplex showed a 50% left carotid stenosis. The arteriogram showed a 40-50% irregular plaque in the left internal carotid artery. MRI at the time of her stroke did show bilateral infarcts. The patient was not anatomic high risk. The patient was ASA 3.  FINDINGS: there was only a moderate stenosis but the plaque was soft and friable. The patient likely had a ruptured plaque at the time of her stroke.  TECHNIQUE: Patient was brought to the operating room after an arterial line was placed by anesthesia. The patient received a general anesthetic. The left neck and upper chest were prepped and draped in usual sterile fashion. An incision was made along the anterior border of the sternocleidomastoid and the dissection carried down to the common carotid artery which was dissected free and controlled with a Rummel tourniquet. The facial veins of either between 2-0 silk ties. The internal carotid artery was controlled above the plaque. The superior thyroid artery and external carotid arteries were controlled. The patient was heparinized. Laps were then placed on the internal then the external then the common carotid artery. A longitudinal arteriotomy was made in the common carotid artery. This was extended through the plaque into the internal carotid artery above the plaque. A 12 shunt was placed into the internal carotid artery then backbled and then placed into the common carotid artery. This was secured with a Rummel tourniquet. Flow was  reestablished the shunt. The plaque was very soft and friable. Endarterectomy plane was established proximally and the plaque was sharply divided. The eversion endarterectomy was performed of the external carotid artery.distally there was a nice tapering the plaque and no tacking sutures were required. The artery was irrigated with copious amounts of heparin and dextran and all loose debris was removed. A Dacron patch was then sewn using continuous 6-0 Prolene suture. Dry her to completing the patch closure, the shunt was removed. The arteries were backbled and flushed appropriately and the anastomosis completed. Flow was reestablished first to the external carotid artery and into the internal carotid artery. At the completion there was excellent flow in the internal carotid artery with a palpable pulse. Hemostasis was obtained in the wound and the heparin was partially reversed with protamine. The wound was closed with deep layer 3-0 Vicryl. The platysma was closed with running 3-0 Vicryl. The skin was closed with a 4-0 subcuticular stitch. Dermabond was applied. The patient awoke neurologically intact. All needle and sponge counts were correct. Patient tolerated the procedure well was transferred to the recovery room in stable condition.  VQI DATA: the procedure was elected. Anesthesia was general. Skin prep was Betadine. There was no EEG or some pressure monitoring. A shunt was used routinely. A Dacron patch was used. No drain was used. Heparin was used. Protamine was used. Dextran was used to irrigate the artery. There was no completion Doppler arteriogram. There was no reexploration required. There were no concomitant procedures.  Waverly Ferrari, MD, FACS [facility]  DATE OF DICTATION:   05/06/2012

## 2012-05-06 NOTE — Anesthesia Preprocedure Evaluation (Addendum)
Anesthesia Evaluation  Patient identified by MRN, date of birth, ID band Patient awake    Reviewed: Allergy & Precautions, H&P , NPO status , Patient's Chart, lab work & pertinent test results  Airway Mallampati: II      Dental  (+) Teeth Intact   Pulmonary  breath sounds clear to auscultation        Cardiovascular Rhythm:Regular Rate:Normal     Neuro/Psych    GI/Hepatic   Endo/Other    Renal/GU      Musculoskeletal   Abdominal (+) + obese,   Peds  Hematology   Anesthesia Other Findings   Reproductive/Obstetrics                           Anesthesia Physical Anesthesia Plan  ASA: III  Anesthesia Plan: General   Post-op Pain Management:    Induction: Intravenous  Airway Management Planned: Oral ETT  Additional Equipment: Arterial line  Intra-op Plan:   Post-operative Plan: Extubation in OR  Informed Consent: I have reviewed the patients History and Physical, chart, labs and discussed the procedure including the risks, benefits and alternatives for the proposed anesthesia with the patient or authorized representative who has indicated his/her understanding and acceptance.   Dental advisory given  Plan Discussed with: CRNA and Surgeon  Anesthesia Plan Comments: (L. Carotid Stenosis with symptoms of L. Hemispheric ischemic event 12/13 Type 2 DM glucose  Htn Smoker anxiety   Plan GA with art line  Kipp Brood, MD)       Anesthesia Quick Evaluation

## 2012-05-06 NOTE — H&P (View-Only) (Signed)
[facility] [place]  Patient name: Morgan Daniels MRN: [id number] DOB: 05/21/1960 Sex: female  REASON FOR CONSULT: Symptomatic carotid disease. Referred by Dr. Michael Soo.  HPI: Morgan Daniels is a 52 y.o. female who was hospitalized for a stroke which occurred in December of 2013. At that time duplex scan suggested a 50% left internal carotid artery stenosis. She had developed the sudden onset of weakness in the right upper extremity and right lower extremity. He also had some expressive aphasia. She states that these symptoms lasted approximately 2 days. She had an MRI which interestingly showed changes in the right parietal lobe which were multifocal. She has been on aspirin and dipyridamole. She later developed some visual field changes with loss of vision in the upper field of the left eye. 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 He states that his symptoms were in the left eye however. She's had no further episodes of weakness appears seizures in her upper extremities were lower extremities.  She sent for vascular evaluation and a carotid duplex scan today.  She does have diabetes, hypertension, and hypercholesterolemia. She is on a statin (Lipitor). She denies any history of myocardial infarction or history of congestive heart failure. There is no family history of premature cardiovascular disease. She has smoked 1-1/2 packs per day of cigarettes for well over 30 years. She states that she is trying to quit currently.  Past Medical History  Diagnosis Date  . IBS (irritable bowel syndrome)   . Fibromyalgia   . Plantar fasciitis   . Fatty liver   . Diabetes   . Stroke Dec. 14,2013    Right side  . Carotid artery occlusion     Family History  Problem Relation Age of Onset  . Hypertension Mother   . Cancer Father   . Hypertension Maternal Grandmother     SOCIAL  HISTORY: History  Substance Use Topics  . Smoking status: Current Every Day Smoker  . Smokeless tobacco: Never Used  . Alcohol Use: Yes     Comment: once a month    Allergies  Allergen Reactions  . Simvastatin Diarrhea and Nausea And Vomiting  . Morphine And Related Itching    itching    Current Outpatient Prescriptions  Medication Sig Dispense Refill  . ALPRAZolam (XANAX) 0.5 MG tablet Take 0.5 mg by mouth 2 (two) times daily as needed.      . amitriptyline (ELAVIL) 100 MG tablet Take 100 mg by mouth at bedtime.      . atorvastatin (LIPITOR) 10 MG tablet Take 10 mg by mouth daily.      . bisoprolol (ZEBETA) 5 MG tablet Take 5 mg by mouth daily. Take 1/2 tab (2.5 mg) daily for BP      . Canagliflozin (INVOKANA) 100 MG TABS Take 100 mg by mouth daily.      . cetirizine (ZYRTEC) 10 MG tablet Take 10 mg by mouth daily.      . diphenhydramine-acetaminophen (TYLENOL PM) 25-500 MG TABS Take 1 tablet by mouth at bedtime as needed.      . dipyridamole (PERSANTINE) 75 MG tablet Take 1 tablet (75 mg total) by mouth 3 (three) times daily.  90 tablet  12  . FA-B6-B12-D-Omega 3-Phytoster (ANIMI-3/VITAMIN D PO) Take 5,000 Units by mouth daily.      . FLUoxetine (PROZAC) 40 MG capsule Take 40 mg by mouth daily.      .   gabapentin (NEURONTIN) 600 MG tablet Take 600 mg by mouth 2 (two) times daily.      . glimepiride (AMARYL) 4 MG tablet Take 4 mg by mouth daily after supper.      . hydrochlorothiazide (MICROZIDE) 12.5 MG capsule Take 12.5 mg by mouth daily.      . insulin detemir (LEVEMIR) 100 UNIT/ML injection Inject into the skin at bedtime.      . insulin lispro (HUMALOG) 100 UNIT/ML injection Inject 100 Units into the skin 3 (three) times daily before meals. Sliding scale prn      . omeprazole (PRILOSEC) 40 MG capsule Take 40 mg by mouth daily.      . ondansetron (ZOFRAN) 8 MG tablet Take by mouth every 8 (eight) hours as needed for nausea.       No current facility-administered medications  for this visit.    REVIEW OF SYSTEMS: [X ] denotes positive finding; [  ] denotes negative finding  CARDIOVASCULAR:  [ ] chest pain   [ ] chest pressure   [ ] palpitations   [ ] orthopnea   [ ] dyspnea on exertion   [ ] claudication   [ ] rest pain   [ ] DVT   [ ] phlebitis PULMONARY:   [ ] productive cough   [ ] asthma   [ ] wheezing NEUROLOGIC:   [X ] weakness  [ ] paresthesias  [ ] aphasia  [X ] amaurosis  [ ] dizziness HEMATOLOGIC:   [ ] bleeding problems   [ ] clotting disorders MUSCULOSKELETAL:  [ ] joint pain   [ ] joint swelling [ ] leg swelling GASTROINTESTINAL: [ ]  blood in stool  [ ]  hematemesis GENITOURINARY:  [ ]  dysuria  [ ]  hematuria PSYCHIATRIC:  [ ] history of major depression INTEGUMENTARY:  [ ] rashes  [ ] ulcers CONSTITUTIONAL:  [ ] fever   [ ] chills  PHYSICAL EXAM: Filed Vitals:   04/28/12 1350 04/28/12 1354  BP: 124/88 120/79  Pulse: 87 90  Resp: 16   Height: 5' 7" (1.702 m)   Weight: 202 lb (91.627 kg)   SpO2: 97% 100%   Body mass index is 31.63 kg/(m^2). GENERAL: The patient is a well-nourished female, in no acute distress. The vital signs are documented above. CARDIOVASCULAR: There is a regular rate and rhythm. I do not detect carotid bruits. She has palpable femoral pulses. PULMONARY: There is good air exchange bilaterally without wheezing or rales. ABDOMEN: Soft and non-tender with normal pitched bowel sounds.  MUSCULOSKELETAL: There are no major deformities or cyanosis. NEUROLOGIC: No focal weakness or paresthesias are detected. SKIN: There are no ulcers or rashes noted. PSYCHIATRIC: The patient has a normal affect.  DATA:  I have independently interpreted the carotid duplex scan in our office today which shows evidence of a 40-59% right internal carotid artery stenosis and a less than 40% left internal carotid artery stenosis. Vertebral arteries are patent with antegrade flow.  I have reviewed her records from [facility] in [place]  . On rectal exam she had emboli noted in the right eye. I have also reviewed her records sent from Dr. Soo's office.  MEDICAL ISSUES: SYMPTOMATIC BILATERAL CAROTID DISEASE: This patient has only mild disease on carotid duplex scan. There is a 40-59% right carotid stenosis with a less than 40% left carotid stenosis. Given that she had right-sided weakness I would be most suspicious of the stenosis on the left   being symptomatic although MRI did show evidence of disease on the right. Visual disturbance was also on the left. Given that there is no significant stenosis noted on duplex on the left, I have recommended cerebral arteriography in order to look for ulceration on the left which might explain her left hemispheric symptoms. Likewise we can further evaluate the stenosis on the right is given the MRI findings this could potentially be symptomatic also. I've explained that she has a smooth mild stenosis then I do not think she would benefit from carotid endarterectomy. However if she had an ulcerated plaque or a more significant narrowing then consideration could be given to carotid endarterectomy. I have discussed the indications for cerebral arteriography. I've also discussed the potential complications of the procedure, including but not limited to: Bleeding, arterial injury, stroke (1%. Procedural risk), renal insufficiency, or other unpredictable medical problems. All the patient's questions were answered and they are agreeable to proceed. We will make further recommendations pending the results of her cerebral arteriogram. In the meantime she knows to continue taking her aspirin. Also discussed the importance of tobacco cessation. I have discussed with the patient the nature of atherosclerosis, and emphasized the importance of maximal medical management including control of blood pressure, blood glucose, and cholesterol levels, antiplatelet agents, obtaining regular exercise, and cessation of  smoking. The patient is aware that without maximal medical management the underlying atherosclerotic disease process will progress and also limit the benefit of any interventions.  DICKSON,CHRISTOPHER S [facility] Beeper: [phone]    

## 2012-05-06 NOTE — Preoperative (Signed)
Beta Blockers   Reason not to administer Beta Blockers:Not Applicable 

## 2012-05-06 NOTE — Progress Notes (Signed)
Pt admitted from PACU d/t left carotid endarterectomy. Incision to left neck is clean, dry, and intact.

## 2012-05-06 NOTE — Discharge Summary (Signed)
[facility] Discharge Summary   Patient ID:  Morgan Daniels MRN: [id number] DOB/AGE: Jan 18, 1961 52 y.o.  Admit date: 05/06/2012 Discharge date: 05/06/2012 Date of Surgery: 05/06/2012 Surgeon: Surgeon(s): Chuck Hint, MD  Admission Diagnosis: Left Internal Carotid Artery Stenosis  Discharge Diagnoses:  Left Internal Carotid Artery Stenosis  Secondary Diagnoses: Past Medical History  Diagnosis Date  . IBS (irritable bowel syndrome)   . Plantar fasciitis   . Fatty liver   . Diabetes   . Carotid artery occlusion   . Hypertension   . Anxiety   . Depression   . Stroke Dec. 14,2013    Right side  . GERD (gastroesophageal reflux disease)   . H/O hiatal hernia   . Fibromyalgia     Procedure(s): ENDARTERECTOMY CAROTID WITH DACRON PATCH ANGIOPLASTY   Discharged Condition: good  HPI: Morgan Daniels is a 52 y.o. female who was hospitalized for a stroke which occurred in December of 2013. At that time duplex scan suggested a 50% left internal carotid artery stenosis. She had developed the sudden onset of weakness in the right upper extremity and right lower extremity. He also had some expressive aphasia. She states that these symptoms lasted approximately 2 days. She had an MRI which interestingly showed changes in the right parietal lobe which were multifocal. She has been on aspirin and dipyridamole. She later developed some visual field changes with loss of vision in the upper field of the left eye. 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 He states that his symptoms were in the left eye however. She's had no further episodes of weakness appears seizures in her upper extremities were lower extremities.  She sent for vascular evaluation and a carotid duplex scan today.  She does have diabetes, hypertension, and hypercholesterolemia. She is on a statin (Lipitor). She denies any  history of myocardial infarction or history of congestive heart failure. There is no family history of premature cardiovascular disease. She has smoked 1-1/2 packs per day of cigarettes for well over 30 years. She states that she is trying to quit currently.    Hospital Course:  Morgan Daniels is a 53 y.o. female is S/P Left Procedure(s): ENDARTERECTOMY CAROTID WITH DACRON PATCH ANGIOPLASTY  Extubated: POD # 0 Physical exam: No weakness, tongue deviation or facial droop noted. Improving left frontal head ache. Post-op wounds healing well Pt. Ambulating, voiding and taking PO diet without difficulty. Pt pain controlled with PO pain meds. Labs as below Complications:none  Consults:     Significant Diagnostic Studies: CBC Lab Results  Component Value Date   WBC 7.5 05/03/2012   HGB 14.5 05/03/2012   HCT 42.6 05/03/2012   MCV 84.0 05/03/2012   PLT 233 05/03/2012    BMET    Component Value Date/Time   NA 137 05/03/2012 1308   K 3.9 05/03/2012 1308   CL 102 05/03/2012 1308   CO2 27 05/03/2012 1308   GLUCOSE 277* 05/03/2012 1308   BUN 16 05/03/2012 1308   CREATININE 0.46* 05/03/2012 1308   CALCIUM 8.9 05/03/2012 1308   GFRNONAA >90 05/03/2012 1308   GFRAA >90 05/03/2012 1308   COAG Lab Results  Component Value Date   INR 0.97 05/03/2012     Disposition:  Discharge to :Home    Medication List    ASK your doctor about these medications       ALPRAZolam 0.5 MG  tablet  Commonly known as:  XANAX  Take 0.5 mg by mouth 2 (two) times daily.     amitriptyline 100 MG tablet  Commonly known as:  ELAVIL  Take 100 mg by mouth at bedtime.     aspirin EC 81 MG tablet  Take 81 mg by mouth daily.     atorvastatin 10 MG tablet  Commonly known as:  LIPITOR  Take 10 mg by mouth daily.     bisoprolol 5 MG tablet  Commonly known as:  ZEBETA  Take 2.5 mg by mouth daily.     cetirizine 10 MG tablet  Commonly known as:  ZYRTEC  Take 10 mg by mouth daily.     ciprofloxacin 500 MG  tablet  Commonly known as:  CIPRO  Take 1 tablet (500 mg total) by mouth 2 (two) times daily.     dipyridamole 75 MG tablet  Commonly known as:  PERSANTINE  Take 1 tablet (75 mg total) by mouth 3 (three) times daily.     FLUoxetine 40 MG capsule  Commonly known as:  PROZAC  Take 40 mg by mouth daily.     gabapentin 600 MG tablet  Commonly known as:  NEURONTIN  Take 600 mg by mouth 2 (two) times daily.     glimepiride 4 MG tablet  Commonly known as:  AMARYL  Take 4 mg by mouth daily after supper.     hydrochlorothiazide 12.5 MG capsule  Commonly known as:  MICROZIDE  Take 12.5 mg by mouth daily.     insulin lispro 100 UNIT/ML injection  Commonly known as:  HUMALOG  Inject 4-6 Units into the skin 3 (three) times daily before meals. Sliding scale     INVOKANA 100 MG Tabs  Generic drug:  Canagliflozin  Take 100 mg by mouth daily.     LEVEMIR 100 UNIT/ML injection  Generic drug:  insulin detemir  Inject 50 Units into the skin at bedtime.     nicotine 21 mg/24hr patch  Commonly known as:  NICODERM CQ - dosed in mg/24 hours  Place 1 patch onto the skin daily.     omeprazole 40 MG capsule  Commonly known as:  PRILOSEC  Take 40 mg by mouth daily.     ondansetron 8 MG tablet  Commonly known as:  ZOFRAN  Take 8 mg by mouth 2 (two) times daily as needed for nausea.     Vitamin D-3 5000 UNITS Tabs  Take 5,000 Units by mouth daily.       Verbal and written Discharge instructions given to the patient. Wound care per Discharge AVS   --- For VQI Registry use --- Instructions: Press F2 to tab through selections.  Delete question if not applicable.   Modified Rankin score at D/C (0-6): 0  IV medication needed for:  1. Hypertension: No 2. Hypotension: No  Post-op Complications: No  1. Post-op CVA or TIA: No  If yes: Event classification (right eye, left eye, right cortical, left cortical, verterobasilar, other):   If yes: Timing of event (intra-op, <6 hrs post-op,  >=6 hrs post-op, unknown):   2. CN injury: No  If yes: CN  injuried   3. Myocardial infarction: No  If yes: Dx by (EKG or clinical, Troponin):   4.  CHF: No  5.  Dysrhythmia (new): No  6. Wound infection: No  7. Reperfusion symptoms: No  8. Return to OR: No  If yes: return to OR for (bleeding, neurologic, other CEA incision, other):  Discharge medications:   Medication List    ASK your doctor about these medications       ALPRAZolam 0.5 MG tablet  Commonly known as:  XANAX  Take 0.5 mg by mouth 2 (two) times daily.     amitriptyline 100 MG tablet  Commonly known as:  ELAVIL  Take 100 mg by mouth at bedtime.     aspirin EC 81 MG tablet  Take 81 mg by mouth daily.     atorvastatin 10 MG tablet  Commonly known as:  LIPITOR  Take 10 mg by mouth daily.     bisoprolol 5 MG tablet  Commonly known as:  ZEBETA  Take 2.5 mg by mouth daily.     cetirizine 10 MG tablet  Commonly known as:  ZYRTEC  Take 10 mg by mouth daily.     ciprofloxacin 500 MG tablet  Commonly known as:  CIPRO  Take 1 tablet (500 mg total) by mouth 2 (two) times daily.     dipyridamole 75 MG tablet  Commonly known as:  PERSANTINE  Take 1 tablet (75 mg total) by mouth 3 (three) times daily.     FLUoxetine 40 MG capsule  Commonly known as:  PROZAC  Take 40 mg by mouth daily.     gabapentin 600 MG tablet  Commonly known as:  NEURONTIN  Take 600 mg by mouth 2 (two) times daily.     glimepiride 4 MG tablet  Commonly known as:  AMARYL  Take 4 mg by mouth daily after supper.     hydrochlorothiazide 12.5 MG capsule  Commonly known as:  MICROZIDE  Take 12.5 mg by mouth daily.     insulin lispro 100 UNIT/ML injection  Commonly known as:  HUMALOG  Inject 4-6 Units into the skin 3 (three) times daily before meals. Sliding scale     INVOKANA 100 MG Tabs  Generic drug:  Canagliflozin  Take 100 mg by mouth daily.     LEVEMIR 100 UNIT/ML injection  Generic drug:  insulin detemir  Inject  50 Units into the skin at bedtime.     nicotine 21 mg/24hr patch  Commonly known as:  NICODERM CQ - dosed in mg/24 hours  Place 1 patch onto the skin daily.     omeprazole 40 MG capsule  Commonly known as:  PRILOSEC  Take 40 mg by mouth daily.     ondansetron 8 MG tablet  Commonly known as:  ZOFRAN  Take 8 mg by mouth 2 (two) times daily as needed for nausea.     Vitamin D-3 5000 UNITS Tabs  Take 5,000 Units by mouth daily.       ASSESSMENT AND PLAN:  * 1 Day Post-Op s/p: L CEA. Mild HA. Low risk for reperfusion syndrome given that this was a mild stenosis.  * Tobacco Counseling: The patient has been counseled on the importance of tobacco cessation.  * GU: Foley out  * ID: On Cipro for UTI. She will continue this.  * DVT prophylaxis: Lovenox  * Statin: Pt is on a statin (Lipitor)  * Disposition: AD/C to home today      Signed: Clinton Gallant [facility] 05/06/2012, 10:02 AM

## 2012-05-06 NOTE — Interval H&P Note (Signed)
History and Physical Interval Note:  05/06/2012 7:25 AM  Morgan Daniels  has presented today for surgery, with the diagnosis of Left Internal Carotid Artery Stenosis  The various methods of treatment have been discussed with the patient and family. After consideration of risks, benefits and other options for treatment, the patient has consented to  Procedure(s): ENDARTERECTOMY CAROTID (Left) as a surgical intervention .  The patient's history has been reviewed, patient examined, no change in status, stable for surgery.  I have reviewed the patient's chart and labs.  Questions were answered to the patient's satisfaction.     Korynne Dols S

## 2012-05-06 NOTE — Progress Notes (Signed)
Utilization review completed.  

## 2012-05-06 NOTE — Progress Notes (Signed)
VASCULAR PROGRESS NOTE  SUBJECTIVE: Pain well controlled.   PHYSICAL EXAM: Filed Vitals:   05/06/12 1055 05/06/12 1100 05/06/12 1110 05/06/12 1216  BP: 105/54  106/55 101/63  Pulse:  89  83  Temp:    98.6 F (37 C)  TempSrc:    Oral  Resp:  16  18  Height:    5\' 7"  (1.702 m)  Weight:    214 lb 1.1 oz (97.1 kg)  SpO2:  92%  92%   Incision looks fine.  Neuro intact.   LABS: Lab Results  Component Value Date   WBC 11.2* 05/06/2012   HGB 13.2 05/06/2012   HCT 38.9 05/06/2012   MCV 83.1 05/06/2012   PLT 236 05/06/2012   Lab Results  Component Value Date   CREATININE 0.48* 05/06/2012   Lab Results  Component Value Date   INR 0.97 05/03/2012   CBG (last 3)   Recent Labs  05/06/12 0608 05/06/12 1007 05/06/12 1309  GLUCAP 164* 211* 202*   ASSESSMENT AND PLAN: 1. Doing well post op. 2. Anticipate D/C tomorrow.   Cari Caraway Beeper: [phone] 05/06/2012

## 2012-05-06 NOTE — Anesthesia Postprocedure Evaluation (Signed)
  Anesthesia Post-op Note  Patient: Morgan Daniels  Procedure(s) Performed: Procedure(s): ENDARTERECTOMY CAROTID (Left) WITH DACRON PATCH ANGIOPLASTY  (Left)  Patient Location: PACU  Anesthesia Type:General  Level of Consciousness: awake, alert  and oriented  Airway and Oxygen Therapy: Patient Spontanous Breathing and Patient connected to nasal cannula oxygen  Post-op Pain: mild  Post-op Assessment: Post-op Vital signs reviewed, Patient's Cardiovascular Status Stable, Respiratory Function Stable, Patent Airway and Pain level controlled  Post-op Vital Signs: stable  Complications: No apparent anesthesia complications

## 2012-05-06 NOTE — Transfer of Care (Signed)
Immediate Anesthesia Transfer of Care Note  Patient: Morgan Daniels  Procedure(s) Performed: Procedure(s): ENDARTERECTOMY CAROTID (Left) WITH DACRON PATCH ANGIOPLASTY  (Left)  Patient Location: PACU  Anesthesia Type:General  Level of Consciousness: awake, alert  and oriented  Airway & Oxygen Therapy: Patient Spontanous Breathing and Patient connected to face mask oxygen  Post-op Assessment: Report given to PACU RN, Post -op Vital signs reviewed and stable and Patient moving all extremities X 4.  Tongue midline.  Post vital signs: Reviewed and stable  Complications: No apparent anesthesia complications

## 2012-05-07 ENCOUNTER — Telehealth: Payer: Self-pay | Admitting: Vascular Surgery

## 2012-05-07 ENCOUNTER — Encounter (HOSPITAL_COMMUNITY): Payer: Self-pay | Admitting: Vascular Surgery

## 2012-05-07 ENCOUNTER — Encounter: Payer: Self-pay | Admitting: Vascular Surgery

## 2012-05-07 LAB — CBC
HCT: 38.7 % (ref 36.0–46.0)
Hemoglobin: 13.3 g/dL (ref 12.0–15.0)
WBC: 13.3 10*3/uL — ABNORMAL HIGH (ref 4.0–10.5)

## 2012-05-07 LAB — GLUCOSE, CAPILLARY
Glucose-Capillary: 211 mg/dL — ABNORMAL HIGH (ref 70–99)
Glucose-Capillary: 152 mg/dL — ABNORMAL HIGH (ref 70–99)

## 2012-05-07 LAB — BASIC METABOLIC PANEL
BUN: 13 mg/dL (ref 6–23)
Chloride: 101 mEq/L (ref 96–112)
GFR calc Af Amer: >90 mL/min (ref >90)
Glucose, Bld: 220 mg/dL — ABNORMAL HIGH (ref 70–99)
Potassium: 4.4 mEq/L (ref 3.5–5.1)

## 2012-05-07 MED ORDER — CIPROFLOXACIN HCL 500 MG PO TABS: 500.0000 mg | ORAL_TABLET | Freq: Two times a day (BID) | ORAL | Status: DC

## 2012-05-07 MED ORDER — OXYCODONE HCL 5 MG PO TABS: 5.0000 mg | ORAL_TABLET | ORAL | Status: DC | PRN

## 2012-05-07 NOTE — Progress Notes (Signed)
Pt d/c home per MD, pt vss, d/c instructions given, family at [facility], all questions answered

## 2012-05-07 NOTE — Progress Notes (Signed)
VASCULAR PROGRESS NOTE  SUBJECTIVE: Mild HA relieved with pain meds.  PHYSICAL EXAM: Filed Vitals:   05/06/12 1617 05/06/12 1930 05/07/12 0000 05/07/12 0402  BP: 129/78 113/66 106/69 122/72  Pulse: 86 89 88 88  Temp: 97 F (36.1 C) 97.6 F (36.4 C) 97.5 F (36.4 C) 97.7 F (36.5 C)  TempSrc: Oral Oral Oral Oral  Resp: 18 24 19 20   Height:      Weight:      SpO2: 93% 92% 91% 90%   Incision looks fine Neuro intact  LABS: Lab Results  Component Value Date   WBC 13.3* 05/07/2012   HGB 13.3 05/07/2012   HCT 38.7 05/07/2012   MCV 85.4 05/07/2012   PLT 274 05/07/2012   Lab Results  Component Value Date   CREATININE 0.37* 05/07/2012   Lab Results  Component Value Date   INR 0.97 05/03/2012   CBG (last 3)   Recent Labs  05/06/12 1612 05/07/12 0006 05/07/12 0359  GLUCAP 229* 187* 211*    ASSESSMENT AND PLAN:  * 1 Day Post-Op s/p: L CEA. Mild HA. Low risk for reperfusion syndrome given that this was a mild stenosis.   * Tobacco Counseling: The patient has been counseled on the importance of tobacco cessation.  * GU: Foley out   * ID: On Cipro for UTI. She will continue this.   * DVT prophylaxis: Lovenox  * Statin: Pt is on a statin (Lipitor)  * Disposition: AD/C to home today if HA's better.   Waverly Ferrari, MD, FACS Beeper [phone] 05/07/2012

## 2012-05-07 NOTE — Telephone Encounter (Addendum)
Message copied by Rosalyn Charters on Fri May 07, 2012  3:57 PM ------      Message from: Melene Plan      Created: Fri May 07, 2012  3:22 PM                   ----- Message -----         From: Lars Mage, PA-C         Sent: 05/07/2012   2:49 PM           To: Melene Plan, RN            F/U with Dr. Edilia Bo in 2 weeks s/p carotid endarterectomy.  ------  NOTIFIED PATIENT OF FU APPT. WITH DR. DICKSON 05-19-12 9:30

## 2012-05-10 ENCOUNTER — Telehealth: Payer: Self-pay

## 2012-05-10 DIAGNOSIS — G8918 Other acute postprocedural pain: Secondary | ICD-10-CM

## 2012-05-10 MED ORDER — OXYCODONE HCL 5 MG PO TABS: 5.0000 mg | ORAL_TABLET | ORAL | Status: DC | PRN

## 2012-05-10 NOTE — Telephone Encounter (Signed)
Pt. C/o "severe headache".  States has had headache even before she left the hospital on 3/21.  Presently rates headache at "3".  States that it has been as high as "8-9".   Reports has taken almost all of Oxycodone; reports taking 2 tabs every 4 hrs.  Denies any s/s of stroke; denies any change or loss of vision, weakness in extremities, and is alert and oriented x 3.  Reports having difficulty with swallowing on the left side of throat; states she chokes on some food.  States " my throat is very sore".  Also reports a soreness and stiffness to the back of her head.  Reported headache and difficulty swallowing to Dr. Edilia Bo .  Recommends pt. to monitor BP.  Recommends pt. to see him in office on Wednesday.  Advised it is okay to refill Oxycodone.  Pt. Notified of recommendation to monitor her BP, and report elevation of BP, or worsening symptoms.  Also informed of approval to refill Oxycodone; pt. will have a family member pick up Rx at the office.  Given appt. for 9:45 AM 05/12/12.  Verb. Understanding of instructions.

## 2012-05-10 NOTE — Discharge Summary (Signed)
Agree with plans for D/C.  Waverly Ferrari, MD, FACS Beeper [phone] 05/10/2012

## 2012-05-11 NOTE — Consult Note (Signed)
NAME:  PRINCESS, KARNES                     ACCOUNT NO.:  MEDICAL RECORD NO.:  [id number]  LOCATION:                                 FACILITY:  PHYSICIAN:  Lysha Schrade K. Tahisha Hakim, M.D.DATE OF BIRTH:  14-Dec-1960  DATE OF CONSULTATION:  05/03/2012 DATE OF DISCHARGE:                                CONSULTATION   CLINICAL HISTORY:  The patient with progressive right internal carotid artery stenosis by ultrasound.  EXAMINATION:  Bilateral common carotid arteriograms, intracranial interpretation.  FINDINGS:  The right common carotid arteriogram demonstrates the right internal carotid artery and its petrous, cavernous, and supraclinoid segments to be widely patent.  The right middle cerebral artery has a focal area of decreased caliber, just distal to the anterior temporal branch.  Antegrade flow unimpeded is seen in the right MCA trifurcation branches.  The right anterior cerebral artery seemed to opacify normally into the capillary and the venous phases.  The left common carotid arteriogram demonstrates the left internal carotid artery in its distal cervical, the petrous and cavernous segments to be widely patent.  The left middle and the left anterior cerebral artery is opacified normally into the capillary and the venous phases.  IMPRESSION: 1. 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   Dictation ended at this point.          ______________________________ Grandville Silos. Corliss Skains, M.D.     SKD/MEDQ  D:  05/10/2012  T:  05/10/2012  Job:  [id number]

## 2012-05-11 NOTE — Consult Note (Deleted)
NAME:  Vecchio, Melea                     ACCOUNT NO.:  MEDICAL RECORD NO.:  [id number]  LOCATION:                                 FACILITY:  PHYSICIAN:  Aarion Kittrell K. Adlynn Lowenstein, M.D.DATE OF BIRTH:  02/07/1961  DATE OF CONSULTATION:  05/03/2012 DATE OF DISCHARGE:                                CONSULTATION   CLINICAL HISTORY:  The patient with progressive right internal carotid artery stenosis by ultrasound.  EXAMINATION:  Bilateral common carotid arteriograms, intracranial interpretation.  FINDINGS:  The right common carotid arteriogram demonstrates the right internal carotid artery and its petrous, cavernous, and supraclinoid segments to be widely patent.  The right middle cerebral artery has a focal area of decreased caliber, just distal to the anterior temporal branch.  Antegrade flow unimpeded is seen in the right MCA trifurcation branches.  The right anterior cerebral artery seemed to opacify normally into the capillary and the venous phases.  The left common carotid arteriogram demonstrates the left internal carotid artery in its distal cervical, the petrous and cavernous segments to be widely patent.  The left middle and the left anterior cerebral artery is opacified normally into the capillary and the venous phases.  IMPRESSION: 1. 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   Dictation ended at this point.          ______________________________ Javares Kaufhold K. Sabiha Sura, M.D.     SKD/MEDQ  D:  05/10/2012  T:  05/10/2012  Job:  [id number] 

## 2012-05-12 ENCOUNTER — Encounter: Payer: Self-pay | Admitting: Vascular Surgery

## 2012-05-12 ENCOUNTER — Ambulatory Visit (INDEPENDENT_AMBULATORY_CARE_PROVIDER_SITE_OTHER): Payer: BC Managed Care – PPO | Admitting: Vascular Surgery

## 2012-05-12 DIAGNOSIS — R519 Headache, unspecified: Secondary | ICD-10-CM | POA: Insufficient documentation

## 2012-05-12 DIAGNOSIS — I6529 Occlusion and stenosis of unspecified carotid artery: Secondary | ICD-10-CM

## 2012-05-12 DIAGNOSIS — R51 Headache: Secondary | ICD-10-CM

## 2012-05-12 DIAGNOSIS — M542 Cervicalgia: Secondary | ICD-10-CM | POA: Insufficient documentation

## 2012-05-12 NOTE — Progress Notes (Signed)
[facility] [place]  Patient name: Morgan Daniels MRN: [id number] DOB: 1960-09-18 Sex: female  REASON FOR VISIT: headache after left carotid endarterectomy.  HPI: Morgan Daniels is a 51 y.o. female who underwent a left carotid endarterectomy with Dacron patch angioplasty on 05/06/2012 for an ulcerated plaque. It was not a very severe stenosis. She has had some postoperative headaches since that time. She also notes some problems swallowing initially and some swelling in the neck. The swallowing has continued to gradually improve. The headaches also appeared to be gradually improving.  REVIEW OF SYSTEMS: Arly.Keller ] denotes positive finding; [  ] denotes negative finding  CARDIOVASCULAR:  [ ]  chest pain   [ ]  dyspnea on exertion    CONSTITUTIONAL:  [ ]  fever   [ ]  chills  PHYSICAL EXAM: Filed Vitals:   05/12/12 1038 05/12/12 1043  BP: 108/78 118/82  Pulse: 87 88  Temp:  97.2 F (36.2 C)  TempSrc:  Oral  Resp: 16   Height: 5\' 7"  (1.702 m)   Weight: 213 lb 13.5 oz (97 kg)   SpO2: 89%    Body mass index is 33.49 kg/(m^2). GENERAL: The patient is a well-nourished female, in no acute distress. The vital signs are documented above. CARDIOVASCULAR: There is a regular rate and rhythm  PULMONARY: There is good air exchange bilaterally without wheezing or rales. Her left neck incision has mild swelling. There is no erythema or drainage. Neurologic exam is intact. Tongue is midline. There is no marginal mandibular nerve dysfunction.  MEDICAL ISSUES: I think that the patient has a normal amount of postoperative swelling in the left neck. I think the swelling will continue to improve as the swelling resolves. Given that she had an ulcerated plaque, and not a tight stenosis, I think she is at very low risk for reperfusion injury. She may have some occipital headache from retraction. I'll plan on seeing her back in 2-3 weeks to make sure that her symptoms are continuing to resolve.  She continues to her aspirin. He knows to call sooner if she has problems.  Morgan Daniels S [facility] [place] Beeper: [phone]

## 2012-05-19 ENCOUNTER — Ambulatory Visit: Payer: BC Managed Care – PPO | Admitting: Vascular Surgery

## 2012-06-01 ENCOUNTER — Encounter: Payer: Self-pay | Admitting: Vascular Surgery

## 2012-06-02 ENCOUNTER — Ambulatory Visit: Payer: BC Managed Care – PPO | Admitting: Vascular Surgery

## 2012-06-02 ENCOUNTER — Encounter: Payer: Self-pay | Admitting: Vascular Surgery

## 2012-06-02 ENCOUNTER — Ambulatory Visit (INDEPENDENT_AMBULATORY_CARE_PROVIDER_SITE_OTHER): Payer: BC Managed Care – PPO | Admitting: Vascular Surgery

## 2012-06-02 DIAGNOSIS — I6529 Occlusion and stenosis of unspecified carotid artery: Secondary | ICD-10-CM

## 2012-06-02 NOTE — Progress Notes (Signed)
[facility] [place]  Patient name: Morgan Daniels MRN: [id number] DOB: 04/22/1960 Sex: female  REASON FOR VISIT: Follow up after left carotid endarterectomy  HPI: Morgan Daniels is a 52 y.o. 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 Duplex showed a moderate 50% left carotid stenosis and arteriogram showed an irregular 40-50% left internal carotid artery stenosis. She underwent left carotid endarterectomy with Dacron patch angioplasty on 05/06/2012. She comes in for a routine follow up visit. She has a 40-59% stenosis on the right.  She was discharged on postoperative day #1. She's had no specific complaints except for some jaw pain. She's had no focal weakness or paresthesias. She denies problems swallowing or hoarseness.     REVIEW OF SYSTEMS: Arly.Keller ] denotes positive finding; [  ] denotes negative finding  CARDIOVASCULAR:  [ ]  chest pain   [ ]  dyspnea on exertion    CONSTITUTIONAL:  [ ]  fever   [ ]  chills  PHYSICAL EXAM: Filed Vitals:   06/02/12 0918 06/02/12 0924  BP: 108/74 113/83  Pulse: 81 88  Resp: 16   Height: 5\' 7"  (1.702 m)   Weight: 201 lb (91.173 kg)   SpO2: 98%    Body mass index is 31.47 kg/(m^2). GENERAL: The patient is a well-nourished female, in no acute distress. The vital signs are documented above. CARDIOVASCULAR: There is a regular rate and rhythm  PULMONARY: There is good air exchange bilaterally without wheezing or rales. Her left neck incision is healing nicely. NEURO: She has no focal weakness or paresthesias  MEDICAL ISSUES: She is doing well status post left carotid endarterectomy. I've ordered a fall carotid duplex scan in 6 months and one year now see her back at that time. She does know to continue taking her aspirin. She'll call sooner if she has problems.  DICKSON,CHRISTOPHER S [facility] [place] Beeper: [phone]

## 2012-06-03 ENCOUNTER — Other Ambulatory Visit: Payer: Self-pay | Admitting: *Deleted

## 2012-06-03 DIAGNOSIS — Z48812 Encounter for surgical aftercare following surgery on the circulatory system: Secondary | ICD-10-CM

## 2012-08-04 ENCOUNTER — Other Ambulatory Visit: Payer: Self-pay

## 2012-08-04 LAB — CBC WITH DIFFERENTIAL/PLATELET
Basophil #: 0.1 10*3/uL (ref 0.0–0.1)
Basophil %: 0.8 %
Eosinophil #: 0.3 10*3/uL (ref 0.0–0.7)
Eosinophil %: 3.9 %
HCT: 46 % (ref 35.0–47.0)
HGB: 15.3 g/dL (ref 12.0–16.0)
Lymphocyte #: 2.9 10*3/uL (ref 1.0–3.6)
MCH: 27.8 pg (ref 26.0–34.0)
Monocyte #: 0.8 x10 3/mm (ref 0.2–0.9)
Neutrophil %: 54 %
Platelet: 299 10*3/uL (ref 150–440)
RBC: 5.51 10*6/uL — ABNORMAL HIGH (ref 3.80–5.20)
RDW: 14.9 % — ABNORMAL HIGH (ref 11.5–14.5)
WBC: 9 10*3/uL (ref 3.6–11.0)

## 2012-08-04 LAB — COMPREHENSIVE METABOLIC PANEL
Albumin: 3.9 g/dL (ref 3.4–5.0)
Anion Gap: 6 — ABNORMAL LOW (ref 7–16)
Bilirubin,Total: 0.2 mg/dL (ref 0.2–1.0)
Chloride: 104 mmol/L (ref 98–107)
Creatinine: 0.61 mg/dL (ref 0.60–1.30)
EGFR (African American): >60
EGFR (Non-African Amer.): >60
Glucose: 207 mg/dL — ABNORMAL HIGH (ref 65–99)
Osmolality: 281 (ref 275–301)
Potassium: 3.9 mmol/L (ref 3.5–5.1)
SGOT(AST): 41 U/L — ABNORMAL HIGH (ref 15–37)
SGPT (ALT): 56 U/L (ref 12–78)

## 2012-08-04 LAB — IRON: Iron: 55 ug/dL (ref 50–170)

## 2012-08-04 LAB — FERRITIN: Ferritin (ARMC): 27 ng/mL (ref 8–388)

## 2012-12-07 ENCOUNTER — Encounter: Payer: Self-pay | Admitting: Family

## 2012-12-08 ENCOUNTER — Ambulatory Visit (HOSPITAL_COMMUNITY)
Admission: RE | Admit: 2012-12-08 | Discharge: 2012-12-08 | Disposition: A | Discharge status: Home / Self Care | Payer: BC Managed Care – PPO | Source: Ambulatory Visit | Attending: Vascular Surgery | Admitting: Vascular Surgery

## 2012-12-08 ENCOUNTER — Encounter: Payer: Self-pay | Admitting: Family

## 2012-12-08 ENCOUNTER — Ambulatory Visit (INDEPENDENT_AMBULATORY_CARE_PROVIDER_SITE_OTHER): Payer: BC Managed Care – PPO | Admitting: Family

## 2012-12-08 DIAGNOSIS — I6529 Occlusion and stenosis of unspecified carotid artery: Secondary | ICD-10-CM

## 2012-12-08 DIAGNOSIS — R6889 Other general symptoms and signs: Secondary | ICD-10-CM

## 2012-12-08 DIAGNOSIS — Z48812 Encounter for surgical aftercare following surgery on the circulatory system: Secondary | ICD-10-CM

## 2012-12-08 DIAGNOSIS — R52 Pain, unspecified: Secondary | ICD-10-CM

## 2012-12-08 DIAGNOSIS — I658 Occlusion and stenosis of other precerebral arteries: Secondary | ICD-10-CM | POA: Insufficient documentation

## 2012-12-08 NOTE — Patient Instructions (Addendum)
Stroke Prevention Some medical conditions and behaviors are associated with an increased chance of having a stroke. You may prevent a stroke by making healthy choices and managing medical conditions. Reduce your risk of having a stroke by:  Staying physically active. Get at least 30 minutes of activity on most or all days.  Not smoking. It may also be helpful to avoid exposure to secondhand smoke.  Limiting alcohol use. Moderate alcohol use is considered to be:  No more than 2 drinks per day for men.  No more than 1 drink per day for nonpregnant women.  Eating healthy foods.  Include 5 or more servings of fruits and vegetables a day.  Certain diets may be prescribed to address high blood pressure, high cholesterol, diabetes, or obesity.  Managing your cholesterol levels.  A low-saturated fat, low-trans fat, low-cholesterol, and high-fiber diet may control cholesterol levels.  Take any prescribed medicines to control cholesterol as directed by your caregiver.  Managing your diabetes.  A controlled-carbohydrate, controlled-sugar diet is recommended to manage diabetes.  Take any prescribed medicines to control diabetes as directed by your caregiver.  Controlling your high blood pressure (hypertension).  A low-salt (sodium), low-saturated fat, low-trans fat, and low-cholesterol diet is recommended to manage high blood pressure.  Take any prescribed medicines to control hypertension as directed by your caregiver.  Maintaining a healthy weight.  A reduced-calorie, low-sodium, low-saturated fat, low-trans fat, low-cholesterol diet is recommended to manage weight.  Stopping drug abuse.  Avoiding birth control pills.  Talk to your caregiver about the risks of taking birth control pills if you are over 35 years old, smoke, get migraines, or have ever had a blood clot.  Getting evaluated for sleep disorders (sleep apnea).  Talk to your caregiver about getting a sleep evaluation  if you snore a lot or have excessive sleepiness.  Taking medicines as directed by your caregiver.  For some people, aspirin or blood thinners (anticoagulants) are helpful in reducing the risk of forming abnormal blood clots that can lead to stroke. If you have the irregular heart rhythm of atrial fibrillation, you should be on a blood thinner unless there is a good reason you cannot take them.  Understand all your medicine instructions. SEEK IMMEDIATE MEDICAL CARE IF:   You have sudden weakness or numbness of the face, arm, or leg, especially on one side of the body.  You have sudden confusion.  You have trouble speaking (aphasia) or understanding.  You have sudden trouble seeing in one or both eyes.  You have sudden trouble walking.  You have dizziness.  You have a loss of balance or coordination.  You have a sudden, severe headache with no known cause.  You have new chest pain or an irregular heartbeat. Any of these symptoms may represent a serious problem that is an emergency. Do not wait to see if the symptoms will go away. Get medical help right away. Call your local emergency services (911 in [place] Do not drive yourself to the hospital. Document Released: 03/13/2004 Document Revised: 04/28/2011 Document Reviewed: 09/23/2010 [employer] Patient Information 2014 [employer]  Smoking Cessation Quitting smoking is important to your health and has many advantages. However, it is not always easy to quit since nicotine is a very addictive drug. Often times, people try 3 times or more before being able to quit. This document explains the best ways for you to prepare to quit smoking. Quitting takes hard work and a lot of effort, but you can do it.   ADVANTAGES OF QUITTING SMOKING You will live longer, feel better, and live better. Your body will feel the impact of quitting smoking almost immediately. Within 20 minutes, blood pressure decreases. Your pulse returns to its normal  level. After 8 hours, carbon monoxide levels in the blood return to normal. Your oxygen level increases. After 24 hours, the chance of having a heart attack starts to decrease. Your breath, hair, and body stop smelling like smoke. After 48 hours, damaged nerve endings begin to recover. Your sense of taste and smell improve. After 72 hours, the body is virtually free of nicotine. Your bronchial tubes relax and breathing becomes easier. After 2 to 12 weeks, lungs can hold more air. Exercise becomes easier and circulation improves. The risk of having a heart attack, stroke, cancer, or lung disease is greatly reduced. After 1 year, the risk of coronary heart disease is cut in half. After 5 years, the risk of stroke falls to the same as a nonsmoker. After 10 years, the risk of lung cancer is cut in half and the risk of other cancers decreases significantly. After 15 years, the risk of coronary heart disease drops, usually to the level of a nonsmoker. If you are pregnant, quitting smoking will improve your chances of having a healthy baby. The people you live with, especially any children, will be healthier. You will have extra money to spend on things other than cigarettes. QUESTIONS TO THINK ABOUT BEFORE ATTEMPTING TO QUIT You may want to talk about your answers with your caregiver. Why do you want to quit? If you tried to quit in the past, what helped and what did not? What will be the most difficult situations for you after you quit? How will you plan to handle them? Who can help you through the tough times? Your family? Friends? A caregiver? What pleasures do you get from smoking? What ways can you still get pleasure if you quit? Here are some questions to ask your caregiver: How can you help me to be successful at quitting? What medicine do you think would be best for me and how should I take it? What should I do if I need more help? What is smoking withdrawal like? How can I get information  on withdrawal? GET READY Set a quit date. Change your environment by getting rid of all cigarettes, ashtrays, matches, and lighters in your home, car, or work. Do not let people smoke in your home. Review your past attempts to quit. Think about what worked and what did not. GET SUPPORT AND ENCOURAGEMENT You have a better chance of being successful if you have help. You can get support in many ways. Tell your family, friends, and co-workers that you are going to quit and need their support. Ask them not to smoke around you. Get individual, group, or telephone counseling and support. Programs are available at local hospitals and health centers. Call your local health department for information about programs in your area. Spiritual beliefs and practices may help some smokers quit. Download a "quit meter" on your computer to keep track of quit statistics, such as how long you have gone without smoking, cigarettes not smoked, and money saved. Get a self-help book about quitting smoking and staying off of tobacco. LEARN NEW SKILLS AND BEHAVIORS Distract yourself from urges to smoke. Talk to someone, go for a walk, or occupy your time with a task. Change your normal routine. Take a different route to work. Drink tea instead of coffee. Eat breakfast   in a different place. Reduce your stress. Take a hot bath, exercise, or read a book. Plan something enjoyable to do every day. Reward yourself for not smoking. Explore interactive web-based programs that specialize in helping you quit. GET MEDICINE AND USE IT CORRECTLY Medicines can help you stop smoking and decrease the urge to smoke. Combining medicine with the above behavioral methods and support can greatly increase your chances of successfully quitting smoking. Nicotine replacement therapy helps deliver nicotine to your body without the negative effects and risks of smoking. Nicotine replacement therapy includes nicotine gum, lozenges, inhalers, nasal  sprays, and skin patches. Some may be available over-the-counter and others require a prescription. Antidepressant medicine helps people abstain from smoking, but how this works is unknown. This medicine is available by prescription. Nicotinic receptor partial agonist medicine simulates the effect of nicotine in your brain. This medicine is available by prescription. Ask your caregiver for advice about which medicines to use and how to use them based on your health history. Your caregiver will tell you what side effects to look out for if you choose to be on a medicine or therapy. Carefully read the information on the package. Do not use any other product containing nicotine while using a nicotine replacement product.  RELAPSE OR DIFFICULT SITUATIONS Most relapses occur within the first 3 months after quitting. Do not be discouraged if you start smoking again. Remember, most people try several times before finally quitting. You may have symptoms of withdrawal because your body is used to nicotine. You may crave cigarettes, be irritable, feel very hungry, cough often, get headaches, or have difficulty concentrating. The withdrawal symptoms are only temporary. They are strongest when you first quit, but they will go away within 10 14 days. To reduce the chances of relapse, try to: Avoid drinking alcohol. Drinking lowers your chances of successfully quitting. Reduce the amount of caffeine you consume. Once you quit smoking, the amount of caffeine in your body increases and can give you symptoms, such as a rapid heartbeat, sweating, and anxiety. Avoid smokers because they can make you want to smoke. Do not let weight gain distract you. Many smokers will gain weight when they quit, usually less than 10 pounds. Eat a healthy diet and stay active. You can always lose the weight gained after you quit. Find ways to improve your mood other than smoking. FOR MORE INFORMATION  www.smokefree.gov  Document Released:  01/28/2001 Document Revised: 08/05/2011 Document Reviewed: 05/15/2011 [employer] Patient Information 2014 [employer].  

## 2012-12-08 NOTE — Progress Notes (Signed)
Established Carotid Patient  Previous Carotid surgery: Yes Surgeon: Edilia Bo  History of Present Illness  Morgan Daniels is a 52 y.o. female patient of Dr. 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 Prior Duplex showed a moderate 50% left carotid stenosis and arteriogram showed an irregular 40-50% left internal carotid artery stenosis. She underwent left carotid endarterectomy with Dacron patch angioplasty on 05/06/2012. Reports that she has fibromyalgia and feels very tired some days. Patient reports that right sided weakness has resolved. Patient denies cardiac problems. The patient reports left amaurosis fugax or monocular blindness that has since resolved, has partial loss of peripheral vision in the left eye.  The patient  denies facial drooping. Patient denies claudication symptoms.   Patient denies New Medical or Surgical History.  Pt Diabetic: Yes, states he last A1C was 9.3. Pt smoker: smoker  (1-2 ppd x since age 5 yrs)  Pt meds include: Statin : Yes Betablocker: Yes ASA: Yes Other anticoagulants/antiplatelets: no   Past Medical History  Diagnosis Date  . IBS (irritable bowel syndrome)   . Plantar fasciitis   . Fatty liver   . Diabetes   . Carotid artery occlusion   . Hypertension   . Anxiety   . Depression   . GERD (gastroesophageal reflux disease)   . H/O hiatal hernia   . Fibromyalgia   . Stroke Dec. 14,2013    Right side-ministroke    Social History History  Substance Use Topics  . Smoking status: Current Every Day Smoker -- 0.00 packs/day for 30 years    Types: Cigarettes  . Smokeless tobacco: Never Used  . Alcohol Use: 0.6 oz/week    1 Glasses of wine per week     Comment: once a month    Family History Family History  Problem Relation Age of Onset  . Hypertension Mother   . Hyperlipidemia Mother   . Deep vein thrombosis Mother   .  Cancer Father   . Hypertension Maternal Grandmother   . Deep vein thrombosis Sister     Surgical History Past Surgical History  Procedure Laterality Date  . Back surgery  2000, 2004  . Hernia repair    . Abdominal hysterectomy  1990  . Cholecystectomy  2001  . Endarterectomy Left 05/06/2012    Procedure: ENDARTERECTOMY CAROTID;  Surgeon: Chuck Hint, MD;  Location: [facility] OR;  Service: Vascular;  Laterality: Left;  . Patch angioplasty Left 05/06/2012    Procedure: WITH DACRON PATCH ANGIOPLASTY ;  Surgeon: Chuck Hint, MD;  Location: [facility] OR;  Service: Vascular;  Laterality: Left;  . Carotid endarterectomy Left 05/06/12  . Spine surgery  2004    Allergies  Allergen Reactions  . Simvastatin Diarrhea and Nausea And Vomiting  . Morphine And Related Itching  . Latex Rash    Current Outpatient Prescriptions  Medication Sig Dispense Refill  . ALPRAZolam (XANAX) 0.5 MG tablet Take 0.5 mg by mouth 2 (two) times daily.       [name] amitriptyline (ELAVIL) 100 MG tablet Take 100 mg by mouth at bedtime.      [name] aspirin EC 81 MG tablet Take 81 mg by mouth daily.      [name] atorvastatin (LIPITOR) 10 MG tablet Take 10 mg by mouth daily.      . bisoprolol (ZEBETA) 5 MG tablet Take 2.5 mg by mouth daily.       [name]  cetirizine (ZYRTEC) 10 MG tablet Take 10 mg by mouth daily.      . Cholecalciferol (VITAMIN D-3) 5000 UNITS TABS Take 5,000 Units by mouth daily.       [name] dipyridamole (PERSANTINE) 75 MG tablet Take 1 tablet (75 mg total) by mouth 3 (three) times daily.  90 tablet  12  . FLUoxetine (PROZAC) 40 MG capsule Take 40 mg by mouth daily.      [name] gabapentin (NEURONTIN) 600 MG tablet Take 600 mg by mouth 2 (two) times daily.      [name] glimepiride (AMARYL) 4 MG tablet Take 4 mg by mouth daily after supper.      . hydrochlorothiazide (MICROZIDE) 12.5 MG capsule Take 12.5 mg by mouth daily.      . insulin detemir (LEVEMIR) 100 UNIT/ML injection Inject 50 Units into the skin at bedtime.       .  insulin lispro (HUMALOG) 100 UNIT/ML injection Inject 4-6 Units into the skin 3 (three) times daily before meals. Sliding scale      . omeprazole (PRILOSEC) 40 MG capsule Take 40 mg by mouth daily.      . ondansetron (ZOFRAN) 8 MG tablet Take 8 mg by mouth 2 (two) times daily as needed for nausea.       . traMADol (ULTRAM) 50 MG tablet Take 50 mg by mouth every 6 (six) hours as needed for pain.      . Canagliflozin (INVOKANA) 100 MG TABS Take 100 mg by mouth daily.      . ciprofloxacin (CIPRO) 500 MG tablet Take 1 tablet (500 mg total) by mouth 2 (two) times daily.  6 tablet  0  . nicotine (NICODERM CQ - DOSED IN MG/24 HOURS) 21 mg/24hr patch Place 1 patch onto the skin daily.      [name] oxyCODONE (OXY IR/ROXICODONE) 5 MG immediate release tablet Take 1-2 tablets (5-10 mg total) by mouth every 4 (four) hours as needed.  30 tablet  0   No current facility-administered medications for this visit.    Review of Systems : [x]  Positive   [ ]  Denies  General:[ ]  Weight loss,  [ ]  Weight gain, [ ]  Loss of appetite, [ ]  Fever, [ ]  chills  Neurologic: [ ]  Dizziness, [ ]  Blackouts, [ ]  Headaches, [ ]  Seizure [ ]  Stroke, [ ]  "Mini stroke", [ ]  Slurred speech, [ ]  Temporary blindness;  [ ] weakness,  Ear/Nose/Throat: [ ]  Change in hearing, [ ]  Nose bleeds, [ ]  Hoarseness  Vascular:[ ]  Pain in legs with walking, [ ]  Pain in feet while lying flat , [ ]   Non-healing ulcer, [ ]  Blood clot in vein,    Pulmonary: [ ]  Home oxygen, [ ]   Productive cough, [ ]  Bronchitis, [ ]  Coughing up blood,  [ ]  Asthma, [ ]  Wheezing  Musculoskeletal:  [ ]  Arthritis, [ ]  Joint pain, [ ]  low back pain  Cardiac: [ ]  Chest pain, [ ]  Shortness of breath when lying flat, [ ]  Shortness of breath with exertion, [ ]  Palpitations, [ ]  Heart murmur, [ ]   Atrial fibrillation  Hematologic:[ ]  Easy Bruising, [ ]  Anemia; [ ]  Hepatitis  Psychiatric: [ ]   Depression, [ ]  Anxiety   Gastrointestinal: [ ]  Black stool, [ ]  Blood in stool, [ ]   Peptic ulcer disease,  [ ]  Gastroesophageal Reflux, [ ]  Trouble swallowing, [ ]  Diarrhea, [ ]  Constipation  Urinary: [ ]  chronic Kidney disease, [ ]  on HD, [ ]  Burning  with urination, [ ]  Frequent urination, [ ]  Difficulty urinating;   Skin: [ ]  Rashes, [ ]  Wounds    Physical Examination  Filed Vitals:   12/08/12 1106  BP: 114/80  Pulse: 84  Resp:    Filed Weights   12/08/12 1103  Weight: 206 lb (93.441 kg)   Body mass index is 32.76 kg/(m^2).   General: WDWN female in NAD GAIT: normal Eyes: PERRLA Pulmonary:  CTAB, Negative  Rales, Negative rhonchi, & Negative wheezing.  Cardiac: regular Rhythm ,  Negative Murmurs.  VASCULAR EXAM Carotid Bruits Left Right   Negative Negative    Aorta is not palpable. Radial pulses are 3+ palpable and  equal.                                                                                                                            LE Pulses LEFT RIGHT       FEMORAL   palpable   palpable        POPLITEAL  not palpable   not palpable       POSTERIOR TIBIAL  not palpable   not palpable        DORSALIS PEDIS      ANTERIOR TIBIAL  palpable   palpable     Gastrointestinal: soft, nontender, BS WNL, no r/g,  negative masses.  Musculoskeletal: Negative muscle atrophy/wasting. M/S 5/5 throughout, Extremities without ischemic changes.  Neurologic: A&O X 3; Appropriate Affect ; SENSATION ;normal;  Speech is normal CN 2-12 intact, Pain and light touch intact in extremities, Motor exam as listed above.   Non-Invasive Vascular Imaging CAROTID DUPLEX 12/08/2012   Right ICA: 40 - 59 % stenosis. Left ICA: Status post CEA, patent with evidence of mild hyperplasia at the proximal patch site.  These findings are Unchanged from previous exam.  Assessment: Morgan Daniels is a 52 y.o. female who presents with asymptomatic 40 - 59 % Right ICA  Stenosis and patent left ICA, CEA site. The  ICA stenosis is  Unchanged from previous  exam. Brachial pressures are equal. Unfortunately she continues to smoke, her spouse also smokes. Her diabetes remains out of control but she is working closely with her PCP to get her diabetes under better control. These 2 risk factors combined with the fact that she had a recent stroke right before her CEA, and had TIA's before that increases her risk of another stroke and progression of arteriosclerosis.  She has not history of cardiac disease but the above risk factors increase her risk of developing CAD.   Plan: Follow-up in 6 months with Carotid Duplex scan.   I discussed in depth with the patient the nature of atherosclerosis, and emphasized the importance of maximal medical management including strict control of blood pressure, blood glucose, and lipid levels, obtaining regular exercise, and cessation of smoking.  The patient is aware that without maximal medical management the underlying atherosclerotic disease process will progress, limiting the benefit  of any interventions. The patient was given information about stroke prevention and what symptoms should prompt the patient to seek immediate medical care. The patient was also counseled re smoking cessation and given printed information re same. Thank you for allowing [place] to participate in this patient's care.  Charisse March, RN, MSN, FNP-C [facility] Office: [phone]  Clinic Physician: Edilia Bo  12/08/2012 11:48 AM

## 2012-12-23 ENCOUNTER — Other Ambulatory Visit: Payer: Self-pay

## 2013-04-08 DIAGNOSIS — Z0279 Encounter for issue of other medical certificate: Secondary | ICD-10-CM

## 2013-06-01 ENCOUNTER — Other Ambulatory Visit (HOSPITAL_COMMUNITY): Payer: BC Managed Care – PPO

## 2013-06-01 ENCOUNTER — Ambulatory Visit: Payer: BC Managed Care – PPO | Admitting: Vascular Surgery

## 2013-06-07 ENCOUNTER — Encounter: Payer: Self-pay | Admitting: Vascular Surgery

## 2013-06-08 ENCOUNTER — Ambulatory Visit: Payer: BC Managed Care – PPO | Admitting: Vascular Surgery

## 2013-06-08 ENCOUNTER — Other Ambulatory Visit (HOSPITAL_COMMUNITY): Payer: BC Managed Care – PPO

## 2013-06-15 ENCOUNTER — Ambulatory Visit: Payer: BC Managed Care – PPO | Admitting: Family

## 2013-06-15 ENCOUNTER — Other Ambulatory Visit (HOSPITAL_COMMUNITY): Payer: BC Managed Care – PPO

## 2013-07-05 ENCOUNTER — Encounter: Payer: Self-pay | Admitting: Vascular Surgery

## 2013-07-06 ENCOUNTER — Ambulatory Visit (HOSPITAL_COMMUNITY)
Admission: RE | Admit: 2013-07-06 | Discharge: 2013-07-06 | Disposition: A | Discharge status: Home / Self Care | Payer: 59 | Source: Ambulatory Visit | Attending: Vascular Surgery | Admitting: Vascular Surgery

## 2013-07-06 ENCOUNTER — Encounter: Payer: Self-pay | Admitting: Family

## 2013-07-06 ENCOUNTER — Other Ambulatory Visit: Payer: Self-pay | Admitting: Vascular Surgery

## 2013-07-06 ENCOUNTER — Ambulatory Visit (INDEPENDENT_AMBULATORY_CARE_PROVIDER_SITE_OTHER): Payer: 59 | Admitting: Family

## 2013-07-06 VITALS — BP 118/78 | HR 94 | Resp 18 | Ht 67.0 in | Wt 204.0 lb

## 2013-07-06 DIAGNOSIS — I658 Occlusion and stenosis of other precerebral arteries: Secondary | ICD-10-CM | POA: Insufficient documentation

## 2013-07-06 DIAGNOSIS — Z48812 Encounter for surgical aftercare following surgery on the circulatory system: Secondary | ICD-10-CM | POA: Diagnosis not present

## 2013-07-06 DIAGNOSIS — I6529 Occlusion and stenosis of unspecified carotid artery: Secondary | ICD-10-CM | POA: Insufficient documentation

## 2013-07-06 NOTE — Addendum Note (Signed)
Addended by: Peter Minium K on: 07/06/2013 05:00 PM   Modules accepted: Orders

## 2013-07-06 NOTE — Patient Instructions (Signed)
Stroke Prevention Some medical conditions and behaviors are associated with an increased chance of having a stroke. You may prevent a stroke by making healthy choices and managing medical conditions. HOW CAN I REDUCE MY RISK OF HAVING A STROKE?   Stay physically active. Get at least 30 minutes of activity on most or all days.  Do not smoke. It may also be helpful to avoid exposure to secondhand smoke.  Limit alcohol use. Moderate alcohol use is considered to be:  No more than 2 drinks per day for men.  No more than 1 drink per day for nonpregnant women.  Eat healthy foods. This involves  Eating 5 or more servings of fruits and vegetables a day.  Following a diet that addresses high blood pressure (hypertension), high cholesterol, diabetes, or obesity.  Manage your cholesterol levels.  A diet low in saturated fat, trans fat, and cholesterol and high in fiber may control cholesterol levels.  Take any prescribed medicines to control cholesterol as directed by your health care provider.  Manage your diabetes.  A controlled-carbohydrate, controlled-sugar diet is recommended to manage diabetes.  Take any prescribed medicines to control diabetes as directed by your health care provider.  Control your hypertension.  A low-salt (sodium), low-saturated fat, low-trans fat, and low-cholesterol diet is recommended to manage hypertension.  Take any prescribed medicines to control hypertension as directed by your health care provider.  Maintain a healthy weight.  A reduced-calorie, low-sodium, low-saturated fat, low-trans fat, low-cholesterol diet is recommended to manage weight.  Stop drug abuse.  Avoid taking birth control pills.  Talk to your health care provider about the risks of taking birth control pills if you are over 35 years old, smoke, get migraines, or have ever had a blood clot.  Get evaluated for sleep disorders (sleep apnea).  Talk to your health care provider about  getting a sleep evaluation if you snore a lot or have excessive sleepiness.  Take medicines as directed by your health care provider.  For some people, aspirin or blood thinners (anticoagulants) are helpful in reducing the risk of forming abnormal blood clots that can lead to stroke. If you have the irregular heart rhythm of atrial fibrillation, you should be on a blood thinner unless there is a good reason you cannot take them.  Understand all your medicine instructions.  Make sure that other other conditions (such as anemia or atherosclerosis) are addressed. SEEK IMMEDIATE MEDICAL CARE IF:   You have sudden weakness or numbness of the face, arm, or leg, especially on one side of the body.  Your face or eyelid droops to one side.  You have sudden confusion.  You have trouble speaking (aphasia) or understanding.  You have sudden trouble seeing in one or both eyes.  You have sudden trouble walking.  You have dizziness.  You have a loss of balance or coordination.  You have a sudden, severe headache with no known cause.  You have new chest pain or an irregular heartbeat. Any of these symptoms may represent a serious problem that is an emergency. Do not wait to see if the symptoms will go away. Get medical help at once. Call your local emergency services  (911 in [place] Do not drive yourself to the hospital. Document Released: 03/13/2004 Document Revised: 11/24/2012 Document Reviewed: 08/06/2012 Exit[employer] Patient Information 2014 [employer].   Smoking Cessation Quitting smoking is important to your health and has many advantages. However, it is not always easy to quit since nicotine is a   very addictive drug. Often times, people try 3 times or more before being able to quit. This document explains the best ways for you to prepare to quit smoking. Quitting takes hard work and a lot of effort, but you can do it. ADVANTAGES OF QUITTING SMOKING  You will live longer, feel better,  and live better.  Your body will feel the impact of quitting smoking almost immediately.  Within 20 minutes, blood pressure decreases. Your pulse returns to its normal level.  After 8 hours, carbon monoxide levels in the blood return to normal. Your oxygen level increases.  After 24 hours, the chance of having a heart attack starts to decrease. Your breath, hair, and body stop smelling like smoke.  After 48 hours, damaged nerve endings begin to recover. Your sense of taste and smell improve.  After 72 hours, the body is virtually free of nicotine. Your bronchial tubes relax and breathing becomes easier.  After 2 to 12 weeks, lungs can hold more air. Exercise becomes easier and circulation improves.  The risk of having a heart attack, stroke, cancer, or lung disease is greatly reduced.  After 1 year, the risk of coronary heart disease is cut in half.  After 5 years, the risk of stroke falls to the same as a nonsmoker.  After 10 years, the risk of lung cancer is cut in half and the risk of other cancers decreases significantly.  After 15 years, the risk of coronary heart disease drops, usually to the level of a nonsmoker.  If you are pregnant, quitting smoking will improve your chances of having a healthy baby.  The people you live with, especially any children, will be healthier.  You will have extra money to spend on things other than cigarettes. QUESTIONS TO THINK ABOUT BEFORE ATTEMPTING TO QUIT You may want to talk about your answers with your caregiver.  Why do you want to quit?  If you tried to quit in the past, what helped and what did not?  What will be the most difficult situations for you after you quit? How will you plan to handle them?  Who can help you through the tough times? Your family? Friends? A caregiver?  What pleasures do you get from smoking? What ways can you still get pleasure if you quit? Here are some questions to ask your caregiver:  How can you  help me to be successful at quitting?  What medicine do you think would be best for me and how should I take it?  What should I do if I need more help?  What is smoking withdrawal like? How can I get information on withdrawal? GET READY  Set a quit date.  Change your environment by getting rid of all cigarettes, ashtrays, matches, and lighters in your home, car, or work. Do not let people smoke in your home.  Review your past attempts to quit. Think about what worked and what did not. GET SUPPORT AND ENCOURAGEMENT You have a better chance of being successful if you have help. You can get support in many ways.  Tell your family, friends, and co-workers that you are going to quit and need their support. Ask them not to smoke around you.  Get individual, group, or telephone counseling and support. Programs are available at local hospitals and health centers. Call your local health department for information about programs in your area.  Spiritual beliefs and practices may help some smokers quit.  Download a "quit meter" on your computer   to keep track of quit statistics, such as how long you have gone without smoking, cigarettes not smoked, and money saved.  Get a self-help book about quitting smoking and staying off of tobacco. LEARN NEW SKILLS AND BEHAVIORS  Distract yourself from urges to smoke. Talk to someone, go for a walk, or occupy your time with a task.  Change your normal routine. Take a different route to work. Drink tea instead of coffee. Eat breakfast in a different place.  Reduce your stress. Take a hot bath, exercise, or read a book.  Plan something enjoyable to do every day. Reward yourself for not smoking.  Explore interactive web-based programs that specialize in helping you quit. GET MEDICINE AND USE IT CORRECTLY Medicines can help you stop smoking and decrease the urge to smoke. Combining medicine with the above behavioral methods and support can greatly increase  your chances of successfully quitting smoking.  Nicotine replacement therapy helps deliver nicotine to your body without the negative effects and risks of smoking. Nicotine replacement therapy includes nicotine gum, lozenges, inhalers, nasal sprays, and skin patches. Some may be available over-the-counter and others require a prescription.  Antidepressant medicine helps people abstain from smoking, but how this works is unknown. This medicine is available by prescription.  Nicotinic receptor partial agonist medicine simulates the effect of nicotine in your brain. This medicine is available by prescription. Ask your caregiver for advice about which medicines to use and how to use them based on your health history. Your caregiver will tell you what side effects to look out for if you choose to be on a medicine or therapy. Carefully read the information on the package. Do not use any other product containing nicotine while using a nicotine replacement product.  RELAPSE OR DIFFICULT SITUATIONS Most relapses occur within the first 3 months after quitting. Do not be discouraged if you start smoking again. Remember, most people try several times before finally quitting. You may have symptoms of withdrawal because your body is used to nicotine. You may crave cigarettes, be irritable, feel very hungry, cough often, get headaches, or have difficulty concentrating. The withdrawal symptoms are only temporary. They are strongest when you first quit, but they will go away within 10 14 days. To reduce the chances of relapse, try to:  Avoid drinking alcohol. Drinking lowers your chances of successfully quitting.  Reduce the amount of caffeine you consume. Once you quit smoking, the amount of caffeine in your body increases and can give you symptoms, such as a rapid heartbeat, sweating, and anxiety.  Avoid smokers because they can make you want to smoke.  Do not let weight gain distract you. Many smokers will gain  weight when they quit, usually less than 10 pounds. Eat a healthy diet and stay active. You can always lose the weight gained after you quit.  Find ways to improve your mood other than smoking. FOR MORE INFORMATION  www.smokefree.gov  Document Released: 01/28/2001 Document Revised: 08/05/2011 Document Reviewed: 05/15/2011 [employer] Patient Information 2014 [employer].  

## 2013-07-06 NOTE — Progress Notes (Signed)
Established Carotid Patient   History of Present Illness  Morgan Daniels is a 53 y.o. female patient of Dr. Scot Dock who had a left brain stroke in December of 2013 as manifested by right side weakness, lethargy, and slurred speech, and TIA's prior to this. Prior Duplex showed a moderate 50% left carotid stenosis and arteriogram showed an irregular 40-50% left internal carotid artery stenosis. She underwent left carotid endarterectomy with Dacron patch angioplasty on 05/06/2012.  She returns today for carotid artery surveillance and follow up.  Pt reports that she has fibromyalgia and feels less tired lately, but has generalized aching.  Patient reports that right sided weakness has resolved.  Patient denies cardiac problems.  The patient reports left amaurosis fugax or monocular blindness that has since resolved, has partial loss of peripheral vision in the left eye. The patient denies facial drooping.  Patient denies claudication symptoms.  Has DM neuropathy and fibromatosis, which causes pain in feet. Patient denies New Medical or Surgical History.  She has been veering off to the left sometimes with walking since her stroke in 2013, this has not increased in severity or frequency.  Pt Diabetic: Yes, states her last A1C was 6.5, big improvement from 9.3 at last visit, with the addition of Victoza.  Pt smoker: smoker (6-7 cigarettes/day, started smoking at age 81 yrs)  Pt meds include:  Statin : Yes, causes nausea or diarrhea at higher doses  Betablocker: Yes  ASA: Yes  Other anticoagulants/antiplatelets: no   Past Medical History  Diagnosis Date  . IBS (irritable bowel syndrome)   . Plantar fasciitis   . Fatty liver   . Diabetes   . Carotid artery occlusion   . Hypertension   . Anxiety   . Depression   . GERD (gastroesophageal reflux disease)   . H/O hiatal hernia   . Fibromyalgia   . Stroke Dec. 14,2013    Right side-ministroke    Social History History  Substance  Use Topics  . Smoking status: Current Every Day Smoker -- 0.00 packs/day for 30 years    Types: Cigarettes  . Smokeless tobacco: Never Used  . Alcohol Use: 0.6 oz/week    1 Glasses of wine per week     Comment: once a month    Family History Family History  Problem Relation Age of Onset  . Hypertension Mother   . Hyperlipidemia Mother   . Deep vein thrombosis Mother   . Cancer Father   . Hypertension Maternal Grandmother   . Deep vein thrombosis Sister     Surgical History Past Surgical History  Procedure Laterality Date  . Back surgery  2000, 2004  . Hernia repair    . Abdominal hysterectomy  1990  . Cholecystectomy  2001  . Endarterectomy Left 05/06/2012    Procedure: ENDARTERECTOMY CAROTID;  Surgeon: Angelia Mould, MD;  Location: [facility];  Service: Vascular;  Laterality: Left;  . Patch angioplasty Left 05/06/2012    Procedure: WITH DACRON PATCH ANGIOPLASTY ;  Surgeon: Angelia Mould, MD;  Location: [facility];  Service: Vascular;  Laterality: Left;  . Carotid endarterectomy Left 05/06/12  . Spine surgery  2004    Allergies  Allergen Reactions  . Simvastatin Diarrhea and Nausea And Vomiting  . Morphine And Related Itching  . Latex Rash    Current Outpatient Prescriptions  Medication Sig Dispense Refill  . ALPRAZolam (XANAX) 0.5 MG tablet Take 0.5 mg by mouth 2 (two) times daily.       [name]  amitriptyline (ELAVIL) 100 MG tablet Take 100 mg by mouth at bedtime.      [name] aspirin EC 81 MG tablet Take 81 mg by mouth daily.      [name] atorvastatin (LIPITOR) 10 MG tablet Take 10 mg by mouth daily.      . bisoprolol (ZEBETA) 5 MG tablet Take 2.5 mg by mouth daily.       . cetirizine (ZYRTEC) 10 MG tablet Take 10 mg by mouth daily.      [name] dipyridamole (PERSANTINE) 75 MG tablet Take 1 tablet (75 mg total) by mouth 3 (three) times daily.  90 tablet  12  . FLUoxetine (PROZAC) 40 MG capsule Take 40 mg by mouth daily.      [name] gabapentin (NEURONTIN) 600 MG tablet Take 600 mg by mouth  2 (two) times daily.      [name] glimepiride (AMARYL) 4 MG tablet Take 4 mg by mouth daily after supper.      . hydrochlorothiazide (MICROZIDE) 12.5 MG capsule Take 12.5 mg by mouth daily.      . insulin lispro (HUMALOG) 100 UNIT/ML injection Inject 4-6 Units into the skin 3 (three) times daily before meals. Sliding scale      . NOVOFINE 32G X 6 MM MISC       . ondansetron (ZOFRAN) 8 MG tablet Take 8 mg by mouth 2 (two) times daily as needed for nausea.       . pantoprazole (PROTONIX) 40 MG tablet 2 (two) times daily.      . traMADol (ULTRAM) 50 MG tablet Take 50 mg by mouth every 6 (six) hours as needed for pain.      [name] triamcinolone cream (KENALOG) 0.1 % as needed.      [name] VICTOZA 18 MG/3ML SOPN daily. 1.2 mg      . Canagliflozin (INVOKANA) 100 MG TABS Take 100 mg by mouth daily.      . Cholecalciferol (VITAMIN D-3) 5000 UNITS TABS Take 5,000 Units by mouth daily.       . ciprofloxacin (CIPRO) 500 MG tablet Take 1 tablet (500 mg total) by mouth 2 (two) times daily.  6 tablet  0  . insulin detemir (LEVEMIR) 100 UNIT/ML injection Inject 50 Units into the skin at bedtime.       . nicotine (NICODERM CQ - DOSED IN MG/24 HOURS) 21 mg/24hr patch Place 1 patch onto the skin daily.      [name] omeprazole (PRILOSEC) 40 MG capsule Take 40 mg by mouth daily.      [name] oxyCODONE (OXY IR/ROXICODONE) 5 MG immediate release tablet Take 1-2 tablets (5-10 mg total) by mouth every 4 (four) hours as needed.  30 tablet  0   No current facility-administered medications for this visit.    Review of Systems : See HPI for pertinent positives and negatives.  Physical Examination   Filed Vitals:   07/06/13 1444 07/06/13 1450  BP: 109/77 118/78  Pulse: 91 94  Resp:  18  Height:  5\' 7"  (1.702 m)  Weight:  204 lb (92.534 kg)  SpO2:  97%   Body mass index is 31.94 kg/(m^2).  General: WDWN obese female in NAD  GAIT: normal  Eyes: PERRLA  Pulmonary: CTAB, Negative Rales, Negative rhonchi, & Negative wheezing.  Cardiac:  regular Rhythm , Negative Murmurs.   VASCULAR EXAM  Carotid Bruits  Left  Right    Negative  Negative   Aorta is not palpable.  Radial pulses are 3+ palpable and equal.  LE Pulses  LEFT  RIGHT   POPLITEAL  not palpable  not palpable   POSTERIOR TIBIAL  not palpable  not palpable   DORSALIS PEDIS  ANTERIOR TIBIAL  palpable  palpable    Gastrointestinal: soft, nontender, BS WNL, no r/g, negative masses.  Musculoskeletal: Negative muscle atrophy/wasting. M/S 5/5 throughout, Extremities without ischemic changes.  Neurologic: A&O X 3; Appropriate Affect ; SENSATION ;normal;  Speech is normal  CN 2-12 intact, Pain and light touch intact in extremities, Motor exam as listed above.    Non-Invasive Vascular Imaging CAROTID DUPLEX 07/06/2013   CEREBROVASCULAR DUPLEX EVALUATION    INDICATION: Carotid stenosis    PREVIOUS INTERVENTION(S): Left carotid endarterectomy 05/06/2012    DUPLEX EXAM:     RIGHT  LEFT  Peak Systolic Velocities (cm/s) End Diastolic Velocities (cm/s) Plaque LOCATION Peak Systolic Velocities (cm/s) End Diastolic Velocities (cm/s) Plaque  117 19  CCA PROXIMAL 87 22   101 24  CCA MID 86 30   69 19 HT CCA DISTAL 83 23 HM  126 20 HT ECA 157 22   228 79 HT ICA PROXIMAL 70 19   168 38  ICA MID 104 37   114 22  ICA DISTAL 52 18      ICA / CCA Ratio (PSV) carotid endarterectomy   Antegrade Vertebral Flow Antegrade  213 Brachial Systolic Pressure (mmHg) 086  Triphasic Brachial Artery Waveforms Triphasic    Plaque Morphology:  HM = Homogeneous, HT = Heterogeneous, CP = Calcific Plaque, SP = Smooth Plaque, IP = Irregular Plaque     ADDITIONAL FINDINGS:     IMPRESSION: Right internal carotid artery stenosis present in the 60%-79% range. Left internal carotid artery is patent with history of carotid endarterectomy, mild hyperplasia at the proximal patch without hemodynamic changes present.    Compared to the previous exam:  Increase in disease on the right and  stable on the left since previous study on 12/08/2012.    Assessment: Ilya Ess is a 53 y.o. female who presents s/p left brain stroke in December of 2013 as manifested by right side weakness, lethargy, and slurred speech, and TIA's prior to this. Prior Duplex showed a moderate 50% left carotid stenosis and arteriogram showed an irregular 40-50% left internal carotid artery stenosis. She underwent left carotid endarterectomy with Dacron patch angioplasty on 05/06/2012. She has no new focal neurological symptoms; she continues to have the same intermittent veering to the left when walking that she has had since her 2013 stroke, this has not increased in frequency nor severity. Her A1C decreased to 6.5 from 9.3, a significant improvement in glycemic control, her tobacco use has decreases from 1-2 ppd to 6-7 cigarettes/day. Unfortunately the higher dose statins cause nausea and diarrhea, she is therefore on a low dose statin. Overall her risk factors for the progression of atherosclerosis have improved significantly, but the left ICA stenosis has increased from 40-59% to 60-79% in 6 months; this was discussed with Dr. Scot Dock.  Plan: Based on today's exam and Duplex result, and after discussing with Dr. Scot Dock, patient was advised to follow-up in 6 months with Carotid Duplex scan.   I discussed in depth with the patient the nature of atherosclerosis, and emphasized the importance of maximal medical management including strict control of blood pressure, blood glucose, and lipid levels, obtaining regular exercise, and cessation of smoking.  The patient is aware that without maximal medical management the underlying atherosclerotic disease process will progress, limiting the benefit of any  interventions. The patient was given information about stroke prevention and what symptoms should prompt the patient to seek immediate medical care. Thank you for allowing [place] to participate in this patient's  care.  Clemon Chambers, RN, MSN, FNP-C [facility] Office: [phone]  Clinic Physician: Scot Dock  07/06/2013 3:08 PM

## 2013-07-27 DIAGNOSIS — M25579 Pain in unspecified ankle and joints of unspecified foot: Secondary | ICD-10-CM | POA: Diagnosis not present

## 2013-07-27 DIAGNOSIS — I6789 Other cerebrovascular disease: Secondary | ICD-10-CM | POA: Diagnosis not present

## 2013-07-27 DIAGNOSIS — E782 Mixed hyperlipidemia: Secondary | ICD-10-CM | POA: Diagnosis not present

## 2013-07-27 DIAGNOSIS — L0291 Cutaneous abscess, unspecified: Secondary | ICD-10-CM | POA: Diagnosis not present

## 2013-07-27 DIAGNOSIS — E119 Type 2 diabetes mellitus without complications: Secondary | ICD-10-CM | POA: Diagnosis not present

## 2013-07-27 DIAGNOSIS — M069 Rheumatoid arthritis, unspecified: Secondary | ICD-10-CM | POA: Diagnosis not present

## 2013-07-27 DIAGNOSIS — I10 Essential (primary) hypertension: Secondary | ICD-10-CM | POA: Diagnosis not present

## 2013-07-27 DIAGNOSIS — F411 Generalized anxiety disorder: Secondary | ICD-10-CM | POA: Diagnosis not present

## 2013-07-29 ENCOUNTER — Inpatient Hospital Stay: Payer: Self-pay | Admitting: Internal Medicine

## 2013-07-29 DIAGNOSIS — J9819 Other pulmonary collapse: Secondary | ICD-10-CM | POA: Diagnosis not present

## 2013-07-29 DIAGNOSIS — I739 Peripheral vascular disease, unspecified: Secondary | ICD-10-CM | POA: Diagnosis not present

## 2013-07-29 DIAGNOSIS — K219 Gastro-esophageal reflux disease without esophagitis: Secondary | ICD-10-CM | POA: Diagnosis present

## 2013-07-29 DIAGNOSIS — E781 Pure hyperglyceridemia: Secondary | ICD-10-CM | POA: Diagnosis present

## 2013-07-29 DIAGNOSIS — E119 Type 2 diabetes mellitus without complications: Secondary | ICD-10-CM | POA: Diagnosis not present

## 2013-07-29 DIAGNOSIS — E785 Hyperlipidemia, unspecified: Secondary | ICD-10-CM | POA: Diagnosis present

## 2013-07-29 DIAGNOSIS — I63239 Cerebral infarction due to unspecified occlusion or stenosis of unspecified carotid arteries: Secondary | ICD-10-CM | POA: Diagnosis present

## 2013-07-29 DIAGNOSIS — I6350 Cerebral infarction due to unspecified occlusion or stenosis of unspecified cerebral artery: Secondary | ICD-10-CM | POA: Diagnosis not present

## 2013-07-29 DIAGNOSIS — R5383 Other fatigue: Secondary | ICD-10-CM | POA: Diagnosis not present

## 2013-07-29 DIAGNOSIS — Z7982 Long term (current) use of aspirin: Secondary | ICD-10-CM | POA: Diagnosis not present

## 2013-07-29 DIAGNOSIS — Z794 Long term (current) use of insulin: Secondary | ICD-10-CM | POA: Diagnosis not present

## 2013-07-29 DIAGNOSIS — I517 Cardiomegaly: Secondary | ICD-10-CM | POA: Diagnosis not present

## 2013-07-29 DIAGNOSIS — I6529 Occlusion and stenosis of unspecified carotid artery: Secondary | ICD-10-CM | POA: Diagnosis not present

## 2013-07-29 DIAGNOSIS — N390 Urinary tract infection, site not specified: Secondary | ICD-10-CM | POA: Diagnosis present

## 2013-07-29 DIAGNOSIS — F17200 Nicotine dependence, unspecified, uncomplicated: Secondary | ICD-10-CM | POA: Diagnosis not present

## 2013-07-29 DIAGNOSIS — F411 Generalized anxiety disorder: Secondary | ICD-10-CM | POA: Diagnosis present

## 2013-07-29 DIAGNOSIS — R5381 Other malaise: Secondary | ICD-10-CM | POA: Diagnosis present

## 2013-07-29 DIAGNOSIS — Z8673 Personal history of transient ischemic attack (TIA), and cerebral infarction without residual deficits: Secondary | ICD-10-CM | POA: Diagnosis not present

## 2013-07-29 DIAGNOSIS — R29898 Other symptoms and signs involving the musculoskeletal system: Secondary | ICD-10-CM | POA: Diagnosis present

## 2013-07-29 DIAGNOSIS — R930 Abnormal findings on diagnostic imaging of skull and head, not elsewhere classified: Secondary | ICD-10-CM | POA: Diagnosis not present

## 2013-07-29 DIAGNOSIS — I10 Essential (primary) hypertension: Secondary | ICD-10-CM | POA: Diagnosis not present

## 2013-07-29 LAB — URINALYSIS, COMPLETE
BLOOD: NEGATIVE
Bilirubin,UR: NEGATIVE
Glucose,UR: NEGATIVE mg/dL (ref 0–75)
Ketone: NEGATIVE
Nitrite: POSITIVE
PH: 6 (ref 4.5–8.0)
Protein: NEGATIVE
RBC,UR: 5 /HPF (ref 0–5)
Specific Gravity: 1.049 (ref 1.003–1.030)
Squamous Epithelial: 3
WBC UR: 38 /HPF (ref 0–5)

## 2013-07-29 LAB — CBC
HCT: 45.3 % (ref 35.0–47.0)
HGB: 15.5 g/dL (ref 12.0–16.0)
MCH: 30.7 pg (ref 26.0–34.0)
MCHC: 34.2 g/dL (ref 32.0–36.0)
MCV: 90 fL (ref 80–100)
PLATELETS: 272 10*3/uL (ref 150–440)
RBC: 5.04 10*6/uL (ref 3.80–5.20)
RDW: 13.2 % (ref 11.5–14.5)
WBC: 7.7 10*3/uL (ref 3.6–11.0)

## 2013-07-29 LAB — PROTIME-INR
INR: 1
PROTHROMBIN TIME: 12.8 s (ref 11.5–14.7)

## 2013-07-29 LAB — TROPONIN I: Troponin-I: <0.02 ng/mL

## 2013-07-29 LAB — COMPREHENSIVE METABOLIC PANEL
ALBUMIN: 3.5 g/dL (ref 3.4–5.0)
ALT: 38 U/L (ref 12–78)
Alkaline Phosphatase: 83 U/L
Anion Gap: 9 (ref 7–16)
BUN: 14 mg/dL (ref 7–18)
Bilirubin,Total: 0.2 mg/dL (ref 0.2–1.0)
CALCIUM: 9.1 mg/dL (ref 8.5–10.1)
CO2: 27 mmol/L (ref 21–32)
CREATININE: 0.58 mg/dL — AB (ref 0.60–1.30)
Chloride: 102 mmol/L (ref 98–107)
EGFR (African American): >60
EGFR (Non-African Amer.): >60
GLUCOSE: 157 mg/dL — AB (ref 65–99)
OSMOLALITY: 279 (ref 275–301)
POTASSIUM: 4 mmol/L (ref 3.5–5.1)
SGOT(AST): 25 U/L (ref 15–37)
SODIUM: 138 mmol/L (ref 136–145)
Total Protein: 7.8 g/dL (ref 6.4–8.2)

## 2013-07-29 LAB — HEMOGLOBIN A1C: Hemoglobin A1C: 6.9 % — ABNORMAL HIGH (ref 4.2–6.3)

## 2013-07-29 LAB — APTT: Activated PTT: 28.3 secs (ref 23.6–35.9)

## 2013-07-30 DIAGNOSIS — I517 Cardiomegaly: Secondary | ICD-10-CM

## 2013-07-30 LAB — MAGNESIUM: Magnesium: 1.9 mg/dL

## 2013-07-30 LAB — CBC WITH DIFFERENTIAL/PLATELET
BASOS ABS: 0.1 10*3/uL (ref 0.0–0.1)
Basophil %: 0.8 %
EOS ABS: 0.4 10*3/uL (ref 0.0–0.7)
Eosinophil %: 4.3 %
HCT: 42.2 % (ref 35.0–47.0)
HGB: 14.3 g/dL (ref 12.0–16.0)
LYMPHS PCT: 27.4 %
Lymphocyte #: 2.9 10*3/uL (ref 1.0–3.6)
MCH: 30.4 pg (ref 26.0–34.0)
MCHC: 34 g/dL (ref 32.0–36.0)
MCV: 90 fL (ref 80–100)
Monocyte #: 0.7 x10 3/mm (ref 0.2–0.9)
Monocyte %: 6.9 %
Neutrophil #: 6.4 10*3/uL (ref 1.4–6.5)
Neutrophil %: 60.6 %
PLATELETS: 263 10*3/uL (ref 150–440)
RBC: 4.71 10*6/uL (ref 3.80–5.20)
RDW: 13 % (ref 11.5–14.5)
WBC: 10.5 10*3/uL (ref 3.6–11.0)

## 2013-07-30 LAB — LIPID PANEL
Cholesterol: 211 mg/dL — ABNORMAL HIGH (ref 0–200)
HDL: 24 mg/dL — AB (ref 40–60)
Triglycerides: 607 mg/dL — ABNORMAL HIGH (ref 0–200)

## 2013-07-30 LAB — BASIC METABOLIC PANEL
ANION GAP: 9 (ref 7–16)
BUN: 15 mg/dL (ref 7–18)
Calcium, Total: 8.8 mg/dL (ref 8.5–10.1)
Chloride: 103 mmol/L (ref 98–107)
Co2: 26 mmol/L (ref 21–32)
Creatinine: 0.67 mg/dL (ref 0.60–1.30)
EGFR (African American): >60
EGFR (Non-African Amer.): >60
Glucose: 109 mg/dL — ABNORMAL HIGH (ref 65–99)
Osmolality: 277 (ref 275–301)
Potassium: 4 mmol/L (ref 3.5–5.1)
Sodium: 138 mmol/L (ref 136–145)

## 2013-08-01 ENCOUNTER — Telehealth: Payer: Self-pay

## 2013-08-01 NOTE — Telephone Encounter (Signed)
Phone call from pt.  Reported she went to [facility] 07/29/13 with left sided weakness and numbness.  Was discharged on 6/13, and instructed to see Dr. Scot Dock within one week.  Stated she was discharged on Plavix.  Reported she continues to have left sided weakness, and walks with a cane.  Requested pt. to obtain copy of MRI/ CT scans from [facility] prior to appt. with Dr. Scot Dock.  Appt. given for 08/03/13 @ 11:30 AM.  Morgan Daniels with plan.

## 2013-08-02 ENCOUNTER — Encounter: Payer: Self-pay | Admitting: Vascular Surgery

## 2013-08-03 ENCOUNTER — Other Ambulatory Visit: Payer: Self-pay

## 2013-08-03 ENCOUNTER — Ambulatory Visit: Payer: Self-pay | Admitting: Podiatry

## 2013-08-03 ENCOUNTER — Encounter: Payer: Self-pay | Admitting: Vascular Surgery

## 2013-08-03 ENCOUNTER — Ambulatory Visit (INDEPENDENT_AMBULATORY_CARE_PROVIDER_SITE_OTHER): Payer: 59 | Admitting: Vascular Surgery

## 2013-08-03 VITALS — BP 125/87 | HR 109 | Ht 67.0 in | Wt 202.0 lb

## 2013-08-03 DIAGNOSIS — I6529 Occlusion and stenosis of unspecified carotid artery: Secondary | ICD-10-CM

## 2013-08-03 DIAGNOSIS — I63239 Cerebral infarction due to unspecified occlusion or stenosis of unspecified carotid arteries: Secondary | ICD-10-CM

## 2013-08-03 NOTE — Progress Notes (Signed)
[facility] [place]  Patient name: Morgan Daniels MRN: [id number] DOB: September 23, 1960 Sex: female  REASON FOR ADMISSION: Carotid disease with recent stroke.  HPI: Morgan Daniels is a 53 y.o. 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 She had a moderate left carotid stenosis and her arteriogram demonstrated significant irregularity of a 40-50% left internal carotid artery stenosis. She underwent left carotid endarterectomy with Dacron patch angioplasty in March of 2014. She was last seen in our office on 07/06/2013. At that time she had a 60-79% right internal carotid artery stenosis. The left carotid endarterectomy site was patent.  oon 07/29/2013 she developed the sudden onset of left upper extremity and left lower extremity weakness and paresthesias. She was admitted at [facility] for a right brain stroke. Her MRI showed small acute embolic infarcts in the right frontal and parietal lobes. She was started on Plavix. She was set up for a vascular follow up visit. She complains of some persistent weakness and paresthesias on the left although it has improved. She denies any significant expressive or receptive aphasia or amaurosis fugax.  Past Medical History  Diagnosis Date  . IBS (irritable bowel syndrome)   . Plantar fasciitis   . Fatty liver   . Diabetes   . Carotid artery occlusion   . Hypertension   . Anxiety   . Depression   . GERD (gastroesophageal reflux disease)   . H/O hiatal hernia   . Fibromyalgia   . Stroke Dec. 14,2013    Right side-ministroke   Family History  Problem Relation Age of Onset  . Hypertension Mother   . Hyperlipidemia Mother   . Deep vein thrombosis Mother   . Cancer Father   . Hypertension Maternal Grandmother   . Deep vein thrombosis Sister    SOCIAL HISTORY: History  Substance Use Topics  . Smoking status: Current Every Day Smoker -- 0.00 packs/day for 30 years    Types: Cigarettes    . Smokeless tobacco: Never Used  . Alcohol Use: 0.6 oz/week    1 Glasses of wine per week     Comment: once a month   Allergies  Allergen Reactions  . Simvastatin Diarrhea and Nausea And Vomiting  . Morphine And Related Itching  . Latex Rash   Current Outpatient Prescriptions  Medication Sig Dispense Refill  . ALPRAZolam (XANAX) 0.5 MG tablet Take 0.5 mg by mouth 2 (two) times daily.       [name] amitriptyline (ELAVIL) 100 MG tablet Take 100 mg by mouth at bedtime.      [name] aspirin EC 81 MG tablet Take 81 mg by mouth daily.      [name] atorvastatin (LIPITOR) 10 MG tablet Take 10 mg by mouth daily.      . bisoprolol (ZEBETA) 5 MG tablet Take 2.5 mg by mouth daily.       . Canagliflozin (INVOKANA) 100 MG TABS Take 100 mg by mouth daily.      . cetirizine (ZYRTEC) 10 MG tablet Take 10 mg by mouth daily.      . Cholecalciferol (VITAMIN D-3) 5000 UNITS TABS Take 5,000 Units by mouth daily.       . ciprofloxacin (CIPRO) 500 MG tablet Take 1 tablet (500 mg total) by mouth 2 (two) times daily.  6 tablet  0  . clopidogrel (PLAVIX) 75 MG tablet Take 75 mg by mouth daily with breakfast.       . FLUoxetine (PROZAC)  40 MG capsule Take 40 mg by mouth daily.      [name] gabapentin (NEURONTIN) 600 MG tablet Take 600 mg by mouth 2 (two) times daily.      [name] glimepiride (AMARYL) 4 MG tablet Take 4 mg by mouth daily after supper.      . insulin lispro (HUMALOG) 100 UNIT/ML injection Inject 4-6 Units into the skin 3 (three) times daily before meals. Sliding scale      . nicotine (NICODERM CQ - DOSED IN MG/24 HOURS) 21 mg/24hr patch Place 1 patch onto the skin daily.      [name] NOVOFINE 32G X 6 MM MISC       . ondansetron (ZOFRAN) 8 MG tablet Take 8 mg by mouth 2 (two) times daily as needed for nausea.       [name] oxyCODONE (OXY IR/ROXICODONE) 5 MG immediate release tablet Take 1-2 tablets (5-10 mg total) by mouth every 4 (four) hours as needed.  30 tablet  0  . pantoprazole (PROTONIX) 40 MG tablet 2 (two) times daily.      .  traMADol (ULTRAM) 50 MG tablet Take 50 mg by mouth every 6 (six) hours as needed for pain.      [name] triamcinolone cream (KENALOG) 0.1 % as needed.      [name] VICTOZA 18 MG/3ML SOPN daily. 1.2 mg      . dipyridamole (PERSANTINE) 75 MG tablet Take 1 tablet (75 mg total) by mouth 3 (three) times daily.  90 tablet  12  . hydrochlorothiazide (MICROZIDE) 12.5 MG capsule Take 12.5 mg by mouth daily.      . insulin detemir (LEVEMIR) 100 UNIT/ML injection Inject 50 Units into the skin at bedtime.       [name] omeprazole (PRILOSEC) 40 MG capsule Take 40 mg by mouth daily.       No current facility-administered medications for this visit.   REVIEW OF SYSTEMS: Valu.Nieves ] denotes positive finding; [  ] denotes negative finding  CARDIOVASCULAR:  [ ]  chest pain   [ ]  chest pressure   [ ]  palpitations   [ ]  orthopnea   [ ]  dyspnea on exertion   [ ]  claudication   [ ]  rest pain   [ ]  DVT   [ ]  phlebitis PULMONARY:   [ ]  productive cough   [ ]  asthma   [ ]  wheezing NEUROLOGIC:   Valu.Nieves ] weakness  Valu.Nieves ] paresthesias  [ ]  aphasia  [ ]  amaurosis  [ ]  dizziness HEMATOLOGIC:   [ ]  bleeding problems   [ ]  clotting disorders MUSCULOSKELETAL:  [ ]  joint pain   [ ]  joint swelling [ ]  leg swelling GASTROINTESTINAL: [ ]   blood in stool  [ ]   hematemesis GENITOURINARY:  [ ]   dysuria  [ ]   hematuria PSYCHIATRIC:  [ ]  history of major depression INTEGUMENTARY:  [ ]  rashes  [ ]  ulcers CONSTITUTIONAL:  [ ]  fever   [ ]  chills  PHYSICAL EXAM: Filed Vitals:   08/03/13 1130 08/03/13 1133  BP: 136/92 125/87  Pulse: 109   Height: 5\' 7"  (1.702 m)   Weight: 202 lb (91.627 kg)   SpO2: 96%    Body mass index is 31.63 kg/(m^2). GENERAL: The patient is a well-nourished female, in no acute distress. The vital signs are documented above. CARDIOVASCULAR: There is a regular rate and rhythm. I do not detect carotid bruits. PULMONARY: There is good air exchange bilaterally without wheezing or rales. ABDOMEN: Soft and non-tender with  normal pitched  bowel sounds.  MUSCULOSKELETAL: There are no major deformities or cyanosis. NEUROLOGIC: She has some mild paresthesias in the left upper extremity and left lower extremity. She has fairly good strength bilaterally. SKIN: There are no ulcers or rashes noted. PSYCHIATRIC: The patient has a normal affect.  DATA:  I have reviewed her duplex scan which shows a 60-79% right carotid stenosis with no significant recurrent stenosis on the left. I've also reviewed her MRI which shows small acute embolic infarction the right frontal and parietal lobes.  MEDICAL ISSUES:  Occlusion and stenosis of carotid artery with cerebral infarction This patient had a recent right brain stroke on 07/29/2013. She has a 60-79% right carotid stenosis. I have recommended right carotid endarterectomy. I have reviewed the indications for carotid endarterectomy, that is to lower the risk of future stroke. I have also reviewed the potential complications of surgery, including but not limited to: bleeding, stroke (perioperative risk 1-2%), MI, nerve injury of other unpredictable medical problems. All of the patients questions were answered and they are agreeable to proceed with surgery. I have instructed the patient to continue taking her aspirin daily. We will have her stop her Plavix 5 days prior to surgery. She knows to continue her statin. Her surgery is scheduled for 08/09/2013.   I have discussed with the patient the nature of atherosclerosis, and emphasized the importance of maximal medical management including control of blood pressure, blood glucose, and cholesterol levels, antiplatelet agents, obtaining regular exercise, and cessation of smoking. The patient is aware that without maximal medical management the underlying atherosclerotic disease process will progress and also limit the benefit of any interventions.  [facility] [place] Beeper: [phone]

## 2013-08-03 NOTE — Assessment & Plan Note (Signed)
This patient had a recent right brain stroke on 07/29/2013. She has a 60-79% right carotid stenosis. I have recommended right carotid endarterectomy. I have reviewed the indications for carotid endarterectomy, that is to lower the risk of future stroke. I have also reviewed the potential complications of surgery, including but not limited to: bleeding, stroke (perioperative risk 1-2%), MI, nerve injury of other unpredictable medical problems. All of the patients questions were answered and they are agreeable to proceed with surgery. I have instructed the patient to continue taking her aspirin daily. We will have her stop her Plavix 5 days prior to surgery. She knows to continue her statin. Her surgery is scheduled for 08/09/2013.

## 2013-08-06 NOTE — Pre-Procedure Instructions (Signed)
Risa Auman  08/06/2013   Your procedure is scheduled on:  June 23  Report to [facility] Admitting at 05:30 AM.  Call this number if you have problems the morning of surgery: [phone]   Remember:   Do not eat food or drink liquids after midnight.   Take these medicines the morning of surgery with A SIP OF WATER: Xanax, Bisoprolol, Cetirizine, Prozac, Gabapentin, Ondansetron/ Zofran (if needed), Pantoprazole, Tramadol (if needed),   STOP/ Do not take Aleve, Naproxen, Advil, Ibuprofen, Motrin, Vitamins, Herbs, or Supplements starting today   Do not wear jewelry, make-up or nail polish.  Do not wear lotions, powders, or perfumes. You may wear deodorant.  Do not shave 48 hours prior to surgery. Men may shave face and neck.  Do not bring valuables to the hospital.  [facility] is not responsible for any belongings or valuables.               Contacts, dentures or bridgework may not be worn into surgery.  Leave suitcase in the car. After surgery it may be brought to your room.  For patients admitted to the hospital, discharge time is determined by your treatment team.               Special Instructions: See [facility] Preparing For Surgery   Please read over the following fact sheets that you were given: Pain Booklet, Coughing and Deep Breathing, Blood Transfusion Information and Surgical Site Infection Prevention

## 2013-08-06 NOTE — Pre-Procedure Instructions (Signed)
[place] - Preparing for Surgery  Before surgery, you can play an important role.  Because skin is not sterile, your skin needs to be as free of germs as possible.  You can reduce the number of germs on you skin by washing with CHG (chlorahexidine gluconate) soap before surgery.  CHG is an antiseptic cleaner which kills germs and bonds with the skin to continue killing germs even after washing.  Please DO NOT use if you have an allergy to CHG or antibacterial soaps.  If your skin becomes reddened/irritated stop using the CHG and inform your nurse when you arrive at Short Stay.  Do not shave (including legs and underarms) for at least 48 hours prior to the first CHG shower.  You may shave your face.  Please follow these instructions carefully:   1.  Shower with CHG Soap the night before surgery and the morning of Surgery.  2.  If you choose to wash your hair, wash your hair first as usual with your normal shampoo.  3.  After you shampoo, rinse your hair and body thoroughly to remove the shampoo.  4.  Use CHG as you would any other liquid soap.  You can apply CHG directly to the skin and wash gently with scrungie or a clean washcloth.  5.  Apply the CHG Soap to your body ONLY FROM THE NECK DOWN.  Do not use on open wounds or open sores.  Avoid contact with your eyes, ears, mouth and genitals (private parts).  Wash genitals (private parts) with your normal soap.  6.  Wash thoroughly, paying special attention to the area where your surgery will be performed.  7.  Thoroughly rinse your body with warm water from the neck down.  8.  DO NOT shower/wash with your normal soap after using and rinsing off the CHG Soap.  9.  Pat yourself dry with a clean towel.            10.  Wear clean pajamas.            11.  Place clean sheets on your bed the night of your first shower and do not sleep with pets.  Day of Surgery  Do not apply any lotions the morning of surgery.  Please wear clean clothes to the  hospital/surgery center.   

## 2013-08-08 ENCOUNTER — Encounter (HOSPITAL_COMMUNITY)
Admission: RE | Admit: 2013-08-08 | Discharge: 2013-08-08 | Disposition: A | Discharge status: Home / Self Care | Payer: 59 | Source: Ambulatory Visit | Attending: Vascular Surgery | Admitting: Vascular Surgery

## 2013-08-08 ENCOUNTER — Encounter (HOSPITAL_COMMUNITY): Payer: Self-pay

## 2013-08-08 DIAGNOSIS — K219 Gastro-esophageal reflux disease without esophagitis: Secondary | ICD-10-CM | POA: Diagnosis present

## 2013-08-08 DIAGNOSIS — I63239 Cerebral infarction due to unspecified occlusion or stenosis of unspecified carotid arteries: Secondary | ICD-10-CM | POA: Diagnosis not present

## 2013-08-08 DIAGNOSIS — IMO0001 Reserved for inherently not codable concepts without codable children: Secondary | ICD-10-CM | POA: Diagnosis present

## 2013-08-08 DIAGNOSIS — F17200 Nicotine dependence, unspecified, uncomplicated: Secondary | ICD-10-CM | POA: Diagnosis present

## 2013-08-08 DIAGNOSIS — F341 Dysthymic disorder: Secondary | ICD-10-CM | POA: Diagnosis present

## 2013-08-08 DIAGNOSIS — Z8249 Family history of ischemic heart disease and other diseases of the circulatory system: Secondary | ICD-10-CM | POA: Diagnosis not present

## 2013-08-08 DIAGNOSIS — E119 Type 2 diabetes mellitus without complications: Secondary | ICD-10-CM | POA: Diagnosis present

## 2013-08-08 DIAGNOSIS — K589 Irritable bowel syndrome without diarrhea: Secondary | ICD-10-CM | POA: Diagnosis present

## 2013-08-08 DIAGNOSIS — Z885 Allergy status to narcotic agent status: Secondary | ICD-10-CM | POA: Diagnosis not present

## 2013-08-08 DIAGNOSIS — Z8673 Personal history of transient ischemic attack (TIA), and cerebral infarction without residual deficits: Secondary | ICD-10-CM | POA: Diagnosis not present

## 2013-08-08 DIAGNOSIS — I6529 Occlusion and stenosis of unspecified carotid artery: Secondary | ICD-10-CM | POA: Diagnosis not present

## 2013-08-08 DIAGNOSIS — Z01818 Encounter for other preprocedural examination: Secondary | ICD-10-CM | POA: Diagnosis not present

## 2013-08-08 DIAGNOSIS — Z7901 Long term (current) use of anticoagulants: Secondary | ICD-10-CM | POA: Diagnosis not present

## 2013-08-08 DIAGNOSIS — Z7982 Long term (current) use of aspirin: Secondary | ICD-10-CM | POA: Diagnosis not present

## 2013-08-08 DIAGNOSIS — Z794 Long term (current) use of insulin: Secondary | ICD-10-CM | POA: Diagnosis not present

## 2013-08-08 DIAGNOSIS — I10 Essential (primary) hypertension: Secondary | ICD-10-CM | POA: Diagnosis present

## 2013-08-08 LAB — SURGICAL PCR SCREEN
MRSA, PCR: NEGATIVE
Staphylococcus aureus: NEGATIVE

## 2013-08-08 LAB — COMPREHENSIVE METABOLIC PANEL
ALBUMIN: 4.1 g/dL (ref 3.5–5.2)
ALT: 35 U/L (ref 0–35)
AST: 28 U/L (ref 0–37)
Alkaline Phosphatase: 85 U/L (ref 39–117)
BUN: 15 mg/dL (ref 6–23)
CALCIUM: 10.1 mg/dL (ref 8.4–10.5)
CO2: 23 mEq/L (ref 19–32)
Chloride: 95 mEq/L — ABNORMAL LOW (ref 96–112)
Creatinine, Ser: 0.51 mg/dL (ref 0.50–1.10)
GFR calc non Af Amer: >90 mL/min (ref >90)
GLUCOSE: 122 mg/dL — AB (ref 70–99)
POTASSIUM: 4.5 meq/L (ref 3.7–5.3)
SODIUM: 135 meq/L — AB (ref 137–147)
TOTAL PROTEIN: 8.6 g/dL — AB (ref 6.0–8.3)
Total Bilirubin: <0.2 mg/dL — ABNORMAL LOW (ref 0.3–1.2)

## 2013-08-08 LAB — CBC
HCT: 46.3 % — ABNORMAL HIGH (ref 36.0–46.0)
Hemoglobin: 15.9 g/dL — ABNORMAL HIGH (ref 12.0–15.0)
MCH: 30.7 pg (ref 26.0–34.0)
MCHC: 34.3 g/dL (ref 30.0–36.0)
MCV: 89.4 fL (ref 78.0–100.0)
PLATELETS: 327 10*3/uL (ref 150–400)
RBC: 5.18 MIL/uL — ABNORMAL HIGH (ref 3.87–5.11)
RDW: 12.9 % (ref 11.5–15.5)
WBC: 12.8 10*3/uL — ABNORMAL HIGH (ref 4.0–10.5)

## 2013-08-08 LAB — PROTIME-INR
INR: 0.98 (ref 0.00–1.49)
Prothrombin Time: 12.8 seconds (ref 11.6–15.2)

## 2013-08-08 LAB — URINALYSIS, ROUTINE W REFLEX MICROSCOPIC
Bilirubin Urine: NEGATIVE
Glucose, UA: NEGATIVE mg/dL
Hgb urine dipstick: NEGATIVE
Ketones, ur: NEGATIVE mg/dL
Leukocytes, UA: NEGATIVE
Nitrite: POSITIVE — AB
PROTEIN: NEGATIVE mg/dL
Specific Gravity, Urine: 1.014 (ref 1.005–1.030)
UROBILINOGEN UA: 0.2 mg/dL (ref 0.0–1.0)
pH: 5.5 (ref 5.0–8.0)

## 2013-08-08 LAB — APTT: aPTT: 28 seconds (ref 24–37)

## 2013-08-08 LAB — TYPE AND SCREEN
ABO/RH(D): A NEG
ANTIBODY SCREEN: NEGATIVE

## 2013-08-08 LAB — URINE MICROSCOPIC-ADD ON

## 2013-08-08 MED ORDER — SODIUM CHLORIDE 0.9 % IV SOLN: INTRAVENOUS | Status: DC

## 2013-08-08 MED ORDER — CHLORHEXIDINE GLUCONATE 4 % EX LIQD: 60.0000 mL | Freq: Once | CUTANEOUS | Status: DC

## 2013-08-08 MED ORDER — DEXTROSE 5 % IV SOLN
1.5000 g | INTRAVENOUS | Status: AC
  Administered 2013-08-09: 1.5 g via INTRAVENOUS
  Filled 2013-08-08: qty 1.5

## 2013-08-08 NOTE — Progress Notes (Signed)
Ekg,cxr,echo requested from [facility]

## 2013-08-09 ENCOUNTER — Inpatient Hospital Stay (HOSPITAL_COMMUNITY): Payer: 59

## 2013-08-09 ENCOUNTER — Encounter (HOSPITAL_COMMUNITY): Payer: Self-pay | Admitting: Surgery

## 2013-08-09 ENCOUNTER — Inpatient Hospital Stay (HOSPITAL_COMMUNITY): Payer: 59 | Admitting: Certified Registered"

## 2013-08-09 ENCOUNTER — Inpatient Hospital Stay (HOSPITAL_COMMUNITY)
Admission: RE | Admit: 2013-08-09 | Discharge: 2013-08-10 | DRG: 039 | Disposition: A | Discharge status: Home / Self Care | Payer: 59 | Source: Ambulatory Visit | Attending: Vascular Surgery | Admitting: Vascular Surgery

## 2013-08-09 ENCOUNTER — Encounter (HOSPITAL_COMMUNITY)
Admission: RE | Disposition: A | Discharge status: Home / Self Care | Payer: Self-pay | Source: Ambulatory Visit | Attending: Vascular Surgery

## 2013-08-09 ENCOUNTER — Encounter (HOSPITAL_COMMUNITY): Payer: 59 | Admitting: Certified Registered"

## 2013-08-09 DIAGNOSIS — Z7901 Long term (current) use of anticoagulants: Secondary | ICD-10-CM

## 2013-08-09 DIAGNOSIS — Z885 Allergy status to narcotic agent status: Secondary | ICD-10-CM

## 2013-08-09 DIAGNOSIS — I6529 Occlusion and stenosis of unspecified carotid artery: Secondary | ICD-10-CM

## 2013-08-09 DIAGNOSIS — Z7982 Long term (current) use of aspirin: Secondary | ICD-10-CM

## 2013-08-09 DIAGNOSIS — Z01818 Encounter for other preprocedural examination: Secondary | ICD-10-CM | POA: Diagnosis not present

## 2013-08-09 DIAGNOSIS — E119 Type 2 diabetes mellitus without complications: Secondary | ICD-10-CM | POA: Diagnosis present

## 2013-08-09 DIAGNOSIS — I10 Essential (primary) hypertension: Secondary | ICD-10-CM | POA: Diagnosis not present

## 2013-08-09 DIAGNOSIS — Z8673 Personal history of transient ischemic attack (TIA), and cerebral infarction without residual deficits: Secondary | ICD-10-CM

## 2013-08-09 DIAGNOSIS — I63239 Cerebral infarction due to unspecified occlusion or stenosis of unspecified carotid arteries: Secondary | ICD-10-CM

## 2013-08-09 DIAGNOSIS — Z8249 Family history of ischemic heart disease and other diseases of the circulatory system: Secondary | ICD-10-CM

## 2013-08-09 DIAGNOSIS — F341 Dysthymic disorder: Secondary | ICD-10-CM | POA: Diagnosis present

## 2013-08-09 DIAGNOSIS — Z794 Long term (current) use of insulin: Secondary | ICD-10-CM

## 2013-08-09 DIAGNOSIS — K219 Gastro-esophageal reflux disease without esophagitis: Secondary | ICD-10-CM | POA: Diagnosis present

## 2013-08-09 DIAGNOSIS — IMO0001 Reserved for inherently not codable concepts without codable children: Secondary | ICD-10-CM | POA: Diagnosis present

## 2013-08-09 DIAGNOSIS — F17200 Nicotine dependence, unspecified, uncomplicated: Secondary | ICD-10-CM | POA: Diagnosis present

## 2013-08-09 DIAGNOSIS — K589 Irritable bowel syndrome without diarrhea: Secondary | ICD-10-CM | POA: Diagnosis present

## 2013-08-09 HISTORY — PX: ENDARTERECTOMY: SHX5162

## 2013-08-09 LAB — GLUCOSE, CAPILLARY
GLUCOSE-CAPILLARY: 101 mg/dL — AB (ref 70–99)
Glucose-Capillary: 148 mg/dL — ABNORMAL HIGH (ref 70–99)
Glucose-Capillary: 128 mg/dL — ABNORMAL HIGH (ref 70–99)
Glucose-Capillary: 194 mg/dL — ABNORMAL HIGH (ref 70–99)

## 2013-08-09 SURGERY — ENDARTERECTOMY, CAROTID
Anesthesia: General | Site: Neck | Laterality: Right

## 2013-08-09 MED ORDER — LIDOCAINE HCL (CARDIAC) 20 MG/ML IV SOLN
INTRAVENOUS | Status: AC
Start: 2013-08-09 — End: 2013-08-09
  Filled 2013-08-09: qty 5

## 2013-08-09 MED ORDER — PHENOL 1.4 % MT LIQD
1.0000 | OROMUCOSAL | Status: DC | PRN
  Filled 2013-08-09: qty 177

## 2013-08-09 MED ORDER — ROCURONIUM BROMIDE 50 MG/5ML IV SOLN
INTRAVENOUS | Status: AC
  Filled 2013-08-09: qty 1

## 2013-08-09 MED ORDER — GLYCOPYRROLATE 0.2 MG/ML IJ SOLN
INTRAMUSCULAR | Status: AC
  Filled 2013-08-09: qty 1

## 2013-08-09 MED ORDER — HYDROMORPHONE HCL PF 1 MG/ML IJ SOLN: 0.2500 mg | INTRAMUSCULAR | Status: DC | PRN

## 2013-08-09 MED ORDER — VECURONIUM BROMIDE 10 MG IV SOLR
INTRAVENOUS | Status: DC | PRN
  Administered 2013-08-09 (×2): 2 mg via INTRAVENOUS

## 2013-08-09 MED ORDER — GLYCOPYRROLATE 0.2 MG/ML IJ SOLN
INTRAMUSCULAR | Status: DC | PRN
  Administered 2013-08-09: .6 mg via INTRAVENOUS

## 2013-08-09 MED ORDER — INSULIN ASPART 100 UNIT/ML ~~LOC~~ SOLN: 4.0000 [IU] | Freq: Three times a day (TID) | SUBCUTANEOUS | Status: DC

## 2013-08-09 MED ORDER — HYDROCHLOROTHIAZIDE 12.5 MG PO CAPS
12.5000 mg | ORAL_CAPSULE | Freq: Every day | ORAL | Status: DC
  Administered 2013-08-09 – 2013-08-10 (×2): 12.5 mg via ORAL
  Filled 2013-08-09 (×2): qty 1

## 2013-08-09 MED ORDER — BISACODYL 10 MG RE SUPP: 10.0000 mg | Freq: Every day | RECTAL | Status: DC | PRN

## 2013-08-09 MED ORDER — NEOSTIGMINE METHYLSULFATE 10 MG/10ML IV SOLN
INTRAVENOUS | Status: DC | PRN
  Administered 2013-08-09: 4 mg via INTRAVENOUS

## 2013-08-09 MED ORDER — METOPROLOL TARTRATE 1 MG/ML IV SOLN: 2.0000 mg | INTRAVENOUS | Status: DC | PRN

## 2013-08-09 MED ORDER — FENTANYL CITRATE 0.05 MG/ML IJ SOLN
INTRAMUSCULAR | Status: DC | PRN
  Administered 2013-08-09 (×2): 50 ug via INTRAVENOUS
  Administered 2013-08-09: 25 ug via INTRAVENOUS
  Administered 2013-08-09: 50 ug via INTRAVENOUS
  Administered 2013-08-09: 25 ug via INTRAVENOUS
  Administered 2013-08-09: 50 ug via INTRAVENOUS

## 2013-08-09 MED ORDER — ALPRAZOLAM 0.5 MG PO TABS
0.5000 mg | ORAL_TABLET | Freq: Three times a day (TID) | ORAL | Status: DC
  Administered 2013-08-09 – 2013-08-10 (×4): 0.5 mg via ORAL
  Filled 2013-08-09 (×4): qty 1

## 2013-08-09 MED ORDER — VECURONIUM BROMIDE 10 MG IV SOLR
INTRAVENOUS | Status: AC
  Filled 2013-08-09: qty 10

## 2013-08-09 MED ORDER — SODIUM CHLORIDE 0.9 % IR SOLN
Status: DC | PRN
  Administered 2013-08-09: 07:00:00

## 2013-08-09 MED ORDER — LIDOCAINE HCL (PF) 1 % IJ SOLN
INTRAMUSCULAR | Status: AC
  Filled 2013-08-09: qty 30

## 2013-08-09 MED ORDER — SODIUM CHLORIDE 0.9 % IV SOLN: 500.0000 mL | Freq: Once | INTRAVENOUS | Status: AC | PRN

## 2013-08-09 MED ORDER — LORATADINE 10 MG PO TABS
10.0000 mg | ORAL_TABLET | Freq: Every day | ORAL | Status: DC
  Administered 2013-08-09 – 2013-08-10 (×2): 10 mg via ORAL
  Filled 2013-08-09 (×2): qty 1

## 2013-08-09 MED ORDER — THROMBIN 20000 UNITS EX SOLR
CUTANEOUS | Status: AC
  Filled 2013-08-09: qty 20000

## 2013-08-09 MED ORDER — HEPARIN SODIUM (PORCINE) 1000 UNIT/ML IJ SOLN
INTRAMUSCULAR | Status: DC | PRN
  Administered 2013-08-09: 8000 [IU] via INTRAVENOUS

## 2013-08-09 MED ORDER — MIDAZOLAM HCL 2 MG/2ML IJ SOLN
INTRAMUSCULAR | Status: AC
  Filled 2013-08-09: qty 2

## 2013-08-09 MED ORDER — GLIMEPIRIDE 4 MG PO TABS
4.0000 mg | ORAL_TABLET | Freq: Two times a day (BID) | ORAL | Status: DC
  Administered 2013-08-09 – 2013-08-10 (×3): 4 mg via ORAL
  Filled 2013-08-09 (×5): qty 1

## 2013-08-09 MED ORDER — ROCURONIUM BROMIDE 100 MG/10ML IV SOLN
INTRAVENOUS | Status: DC | PRN
  Administered 2013-08-09: 50 mg via INTRAVENOUS

## 2013-08-09 MED ORDER — ENOXAPARIN SODIUM 40 MG/0.4ML ~~LOC~~ SOLN
40.0000 mg | SUBCUTANEOUS | Status: DC
  Administered 2013-08-10: 40 mg via SUBCUTANEOUS
  Filled 2013-08-09 (×2): qty 0.4

## 2013-08-09 MED ORDER — FENTANYL CITRATE 0.05 MG/ML IJ SOLN
INTRAMUSCULAR | Status: AC
  Filled 2013-08-09: qty 5

## 2013-08-09 MED ORDER — VITAMIN D3 25 MCG (1000 UNIT) PO TABS
5000.0000 [IU] | ORAL_TABLET | Freq: Every day | ORAL | Status: DC
  Administered 2013-08-09 – 2013-08-10 (×2): 5000 [IU] via ORAL
  Filled 2013-08-09 (×2): qty 5

## 2013-08-09 MED ORDER — GLYCOPYRROLATE 0.2 MG/ML IJ SOLN
INTRAMUSCULAR | Status: AC
  Filled 2013-08-09: qty 3

## 2013-08-09 MED ORDER — ACETAMINOPHEN 325 MG PO TABS
325.0000 mg | ORAL_TABLET | ORAL | Status: DC | PRN
  Administered 2013-08-09 (×2): 650 mg via ORAL
  Administered 2013-08-10: 325 mg via ORAL
  Administered 2013-08-10: 650 mg via ORAL
  Filled 2013-08-09 (×4): qty 2

## 2013-08-09 MED ORDER — BISOPROLOL FUMARATE 5 MG PO TABS
2.5000 mg | ORAL_TABLET | Freq: Every day | ORAL | Status: DC
  Administered 2013-08-09 – 2013-08-10 (×2): 2.5 mg via ORAL
  Filled 2013-08-09 (×2): qty 0.5

## 2013-08-09 MED ORDER — EPHEDRINE SULFATE 50 MG/ML IJ SOLN
INTRAMUSCULAR | Status: AC
  Filled 2013-08-09: qty 1

## 2013-08-09 MED ORDER — PROTAMINE SULFATE 10 MG/ML IV SOLN
INTRAVENOUS | Status: DC | PRN
  Administered 2013-08-09 (×4): 10 mg via INTRAVENOUS

## 2013-08-09 MED ORDER — SODIUM CHLORIDE 0.9 % IV SOLN
INTRAVENOUS | Status: DC
  Administered 2013-08-09: 11:00:00 via INTRAVENOUS

## 2013-08-09 MED ORDER — PROMETHAZINE HCL 25 MG/ML IJ SOLN: 6.2500 mg | INTRAMUSCULAR | Status: DC | PRN

## 2013-08-09 MED ORDER — ONDANSETRON HCL 4 MG/2ML IJ SOLN
INTRAMUSCULAR | Status: AC
  Filled 2013-08-09: qty 2

## 2013-08-09 MED ORDER — ATORVASTATIN CALCIUM 10 MG PO TABS
10.0000 mg | ORAL_TABLET | Freq: Every day | ORAL | Status: DC
  Administered 2013-08-09: 10 mg via ORAL
  Filled 2013-08-09 (×2): qty 1

## 2013-08-09 MED ORDER — ONDANSETRON HCL 4 MG/2ML IJ SOLN
INTRAMUSCULAR | Status: DC | PRN
  Administered 2013-08-09: 4 mg via INTRAVENOUS

## 2013-08-09 MED ORDER — GABAPENTIN 300 MG PO CAPS
600.0000 mg | ORAL_CAPSULE | Freq: Two times a day (BID) | ORAL | Status: DC
  Administered 2013-08-09 – 2013-08-10 (×2): 600 mg via ORAL
  Filled 2013-08-09 (×3): qty 2

## 2013-08-09 MED ORDER — LIDOCAINE HCL (CARDIAC) 20 MG/ML IV SOLN
INTRAVENOUS | Status: DC | PRN
  Administered 2013-08-09: 80 mg via INTRATRACHEAL
  Administered 2013-08-09: 80 mg via INTRAVENOUS

## 2013-08-09 MED ORDER — ASPIRIN EC 325 MG PO TBEC
325.0000 mg | DELAYED_RELEASE_TABLET | Freq: Every day | ORAL | Status: DC
  Administered 2013-08-10: 325 mg via ORAL
  Filled 2013-08-09: qty 1

## 2013-08-09 MED ORDER — FLUOXETINE HCL 40 MG PO CAPS: 40.0000 mg | ORAL_CAPSULE | Freq: Every day | ORAL | Status: DC

## 2013-08-09 MED ORDER — INSULIN ASPART 100 UNIT/ML ~~LOC~~ SOLN
0.0000 [IU] | Freq: Three times a day (TID) | SUBCUTANEOUS | Status: DC
  Administered 2013-08-10: 3 [IU] via SUBCUTANEOUS
  Administered 2013-08-10: 5 [IU] via SUBCUTANEOUS

## 2013-08-09 MED ORDER — LABETALOL HCL 5 MG/ML IV SOLN
INTRAVENOUS | Status: DC | PRN
  Administered 2013-08-09 (×4): 5 mg via INTRAVENOUS

## 2013-08-09 MED ORDER — CLOPIDOGREL BISULFATE 75 MG PO TABS
75.0000 mg | ORAL_TABLET | Freq: Every day | ORAL | Status: DC
  Administered 2013-08-10: 75 mg via ORAL
  Filled 2013-08-09 (×2): qty 1

## 2013-08-09 MED ORDER — GUAIFENESIN-DM 100-10 MG/5ML PO SYRP: 15.0000 mL | ORAL_SOLUTION | ORAL | Status: DC | PRN

## 2013-08-09 MED ORDER — DOCUSATE SODIUM 100 MG PO CAPS: 100.0000 mg | ORAL_CAPSULE | Freq: Every day | ORAL | Status: DC

## 2013-08-09 MED ORDER — PHENYLEPHRINE 40 MCG/ML (10ML) SYRINGE FOR IV PUSH (FOR BLOOD PRESSURE SUPPORT)
PREFILLED_SYRINGE | INTRAVENOUS | Status: AC
  Filled 2013-08-09: qty 20

## 2013-08-09 MED ORDER — NITROGLYCERIN 0.2 MG/ML ON CALL CATH LAB
INTRAVENOUS | Status: DC | PRN
  Administered 2013-08-09 (×3): 20 ug via INTRAVENOUS

## 2013-08-09 MED ORDER — LACTATED RINGERS IV SOLN
INTRAVENOUS | Status: DC | PRN
  Administered 2013-08-09 (×2): via INTRAVENOUS

## 2013-08-09 MED ORDER — STERILE WATER FOR INJECTION IJ SOLN
INTRAMUSCULAR | Status: AC
  Filled 2013-08-09: qty 10

## 2013-08-09 MED ORDER — FLUOXETINE HCL 20 MG PO CAPS
40.0000 mg | ORAL_CAPSULE | Freq: Every day | ORAL | Status: DC
  Administered 2013-08-10: 40 mg via ORAL
  Filled 2013-08-09: qty 2

## 2013-08-09 MED ORDER — PANTOPRAZOLE SODIUM 40 MG PO TBEC
40.0000 mg | DELAYED_RELEASE_TABLET | Freq: Every day | ORAL | Status: DC
  Administered 2013-08-09 – 2013-08-10 (×2): 40 mg via ORAL
  Filled 2013-08-09 (×2): qty 1

## 2013-08-09 MED ORDER — BIOTENE DRY MOUTH MT LIQD
15.0000 mL | Freq: Two times a day (BID) | OROMUCOSAL | Status: DC
  Administered 2013-08-09: 15 mL via OROMUCOSAL

## 2013-08-09 MED ORDER — LIDOCAINE-EPINEPHRINE (PF) 1 %-1:200000 IJ SOLN
INTRAMUSCULAR | Status: DC | PRN
  Administered 2013-08-09: 30 mL

## 2013-08-09 MED ORDER — TRAMADOL HCL 50 MG PO TABS
100.0000 mg | ORAL_TABLET | Freq: Two times a day (BID) | ORAL | Status: DC
  Administered 2013-08-09 – 2013-08-10 (×2): 100 mg via ORAL
  Filled 2013-08-09 (×2): qty 2

## 2013-08-09 MED ORDER — DOPAMINE-DEXTROSE 3.2-5 MG/ML-% IV SOLN: 3.0000 ug/kg/min | INTRAVENOUS | Status: DC

## 2013-08-09 MED ORDER — DEXTROSE 5 % IV SOLN
1.5000 g | Freq: Two times a day (BID) | INTRAVENOUS | Status: AC
  Administered 2013-08-09 – 2013-08-10 (×2): 1.5 g via INTRAVENOUS
  Filled 2013-08-09 (×2): qty 1.5

## 2013-08-09 MED ORDER — SODIUM CHLORIDE 0.9 % IJ SOLN
INTRAMUSCULAR | Status: AC
  Filled 2013-08-09: qty 10

## 2013-08-09 MED ORDER — LABETALOL HCL 5 MG/ML IV SOLN
INTRAVENOUS | Status: AC
  Filled 2013-08-09: qty 4

## 2013-08-09 MED ORDER — PROPOFOL 10 MG/ML IV BOLUS
INTRAVENOUS | Status: AC
  Filled 2013-08-09: qty 20

## 2013-08-09 MED ORDER — HYDRALAZINE HCL 20 MG/ML IJ SOLN
10.0000 mg | INTRAMUSCULAR | Status: DC | PRN
Start: 2013-08-09 — End: 2013-08-10

## 2013-08-09 MED ORDER — VITAMIN D-3 125 MCG (5000 UT) PO TABS: 5000.0000 [IU] | ORAL_TABLET | Freq: Every day | ORAL | Status: DC

## 2013-08-09 MED ORDER — AMITRIPTYLINE HCL 100 MG PO TABS
100.0000 mg | ORAL_TABLET | Freq: Every day | ORAL | Status: DC
  Administered 2013-08-09: 100 mg via ORAL
  Filled 2013-08-09 (×2): qty 1

## 2013-08-09 MED ORDER — NEOSTIGMINE METHYLSULFATE 10 MG/10ML IV SOLN
INTRAVENOUS | Status: AC
  Filled 2013-08-09: qty 1

## 2013-08-09 MED ORDER — TRAMADOL HCL 50 MG PO TABS: 100.0000 mg | ORAL_TABLET | Freq: Two times a day (BID) | ORAL | Status: DC

## 2013-08-09 MED ORDER — ACETAMINOPHEN 650 MG RE SUPP: 325.0000 mg | RECTAL | Status: DC | PRN

## 2013-08-09 MED ORDER — MORPHINE SULFATE 2 MG/ML IJ SOLN
2.0000 mg | INTRAMUSCULAR | Status: DC | PRN
  Administered 2013-08-09: 2 mg via INTRAVENOUS
  Administered 2013-08-09: 4 mg via INTRAVENOUS
  Administered 2013-08-09: 2 mg via INTRAVENOUS
  Administered 2013-08-09: 4 mg via INTRAVENOUS
  Administered 2013-08-09: 2 mg via INTRAVENOUS
  Administered 2013-08-09 – 2013-08-10 (×3): 4 mg via INTRAVENOUS
  Administered 2013-08-10: 2 mg via INTRAVENOUS
  Filled 2013-08-09 (×4): qty 2
  Filled 2013-08-09 (×2): qty 1
  Filled 2013-08-09: qty 2
  Filled 2013-08-09 (×2): qty 1

## 2013-08-09 MED ORDER — NICOTINE 21 MG/24HR TD PT24
21.0000 mg | MEDICATED_PATCH | Freq: Every day | TRANSDERMAL | Status: DC
  Administered 2013-08-09 – 2013-08-10 (×2): 21 mg via TRANSDERMAL
  Filled 2013-08-09 (×2): qty 1

## 2013-08-09 MED ORDER — PROPOFOL 10 MG/ML IV BOLUS
INTRAVENOUS | Status: DC | PRN
  Administered 2013-08-09: 200 mg via INTRAVENOUS

## 2013-08-09 MED ORDER — ALUM & MAG HYDROXIDE-SIMETH 200-200-20 MG/5ML PO SUSP
15.0000 mL | ORAL | Status: DC | PRN
  Administered 2013-08-09 – 2013-08-10 (×2): 30 mL via ORAL
  Filled 2013-08-09 (×2): qty 30

## 2013-08-09 MED ORDER — ONDANSETRON HCL 4 MG/2ML IJ SOLN: 4.0000 mg | Freq: Four times a day (QID) | INTRAMUSCULAR | Status: DC | PRN

## 2013-08-09 MED ORDER — 0.9 % SODIUM CHLORIDE (POUR BTL) OPTIME
TOPICAL | Status: DC | PRN
  Administered 2013-08-09: 1000 mL

## 2013-08-09 MED ORDER — PHENYLEPHRINE HCL 10 MG/ML IJ SOLN
10.0000 mg | INTRAMUSCULAR | Status: DC | PRN
  Administered 2013-08-09: 20 ug/min via INTRAVENOUS

## 2013-08-09 MED ORDER — POTASSIUM CHLORIDE CRYS ER 20 MEQ PO TBCR: 20.0000 meq | EXTENDED_RELEASE_TABLET | Freq: Every day | ORAL | Status: DC | PRN

## 2013-08-09 MED ORDER — LABETALOL HCL 5 MG/ML IV SOLN: 10.0000 mg | INTRAVENOUS | Status: DC | PRN

## 2013-08-09 MED ORDER — SUCCINYLCHOLINE CHLORIDE 20 MG/ML IJ SOLN
INTRAMUSCULAR | Status: AC
  Filled 2013-08-09: qty 1

## 2013-08-09 SURGICAL SUPPLY — 55 items
ADH SKN CLS APL DERMABOND .7 (GAUZE/BANDAGES/DRESSINGS) ×1
ADH SKN CLS LQ APL DERMABOND (GAUZE/BANDAGES/DRESSINGS) ×1
BAG DECANTER FOR FLEXI CONT (MISCELLANEOUS) ×3 IMPLANT
BLADE 10 SAFETY STRL DISP (BLADE) ×3 IMPLANT
CANISTER SUCTION 2500CC (MISCELLANEOUS) ×3 IMPLANT
CANNULA VESSEL 3MM 2 BLNT TIP (CANNULA) ×3 IMPLANT
CATH ROBINSON RED A/P 18FR (CATHETERS) ×1 IMPLANT
CLIP TI MEDIUM 24 (CLIP) ×3 IMPLANT
CLIP TI WIDE RED SMALL 24 (CLIP) ×3 IMPLANT
COVER SURGICAL LIGHT HANDLE (MISCELLANEOUS) ×3 IMPLANT
CRADLE DONUT ADULT HEAD (MISCELLANEOUS) ×3 IMPLANT
DERMABOND ADHESIVE PROPEN (GAUZE/BANDAGES/DRESSINGS) ×2
DERMABOND ADVANCED (GAUZE/BANDAGES/DRESSINGS) ×2
DERMABOND ADVANCED .7 DNX12 (GAUZE/BANDAGES/DRESSINGS) ×1 IMPLANT
DERMABOND ADVANCED .7 DNX6 (GAUZE/BANDAGES/DRESSINGS) IMPLANT
DRAIN CHANNEL 15F RND FF W/TCR (WOUND CARE) IMPLANT
DRAPE WARM FLUID 44X44 (DRAPE) ×3 IMPLANT
ELECT REM PT RETURN 9FT ADLT (ELECTROSURGICAL) ×3
ELECTRODE REM PT RTRN 9FT ADLT (ELECTROSURGICAL) ×1 IMPLANT
EVACUATOR SILICONE 100CC (DRAIN) IMPLANT
GLOVE BIO SURGEON STRL SZ7.5 (GLOVE) ×3 IMPLANT
GLOVE BIOGEL PI IND STRL 6.5 (GLOVE) IMPLANT
GLOVE BIOGEL PI IND STRL 8 (GLOVE) ×1 IMPLANT
GLOVE BIOGEL PI INDICATOR 6.5 (GLOVE) ×4
GLOVE BIOGEL PI INDICATOR 8 (GLOVE) ×2
GLOVE SKINSENSE NS SZ7.0 (GLOVE) ×8
GLOVE SKINSENSE STRL SZ7.0 (GLOVE) IMPLANT
GOWN STRL REUS W/ TWL LRG LVL3 (GOWN DISPOSABLE) ×3 IMPLANT
GOWN STRL REUS W/TWL LRG LVL3 (GOWN DISPOSABLE) ×9
KIT BASIN OR (CUSTOM PROCEDURE TRAY) ×3 IMPLANT
KIT ROOM TURNOVER OR (KITS) ×3 IMPLANT
KIT SUCTION CATH 14FR (SUCTIONS) ×2 IMPLANT
NDL HYPO 25X1 1.5 SAFETY (NEEDLE) ×1 IMPLANT
NEEDLE HYPO 25X1 1.5 SAFETY (NEEDLE) ×3 IMPLANT
NS IRRIG 1000ML POUR BTL (IV SOLUTION) ×6 IMPLANT
PACK CAROTID (CUSTOM PROCEDURE TRAY) ×3 IMPLANT
PAD ARMBOARD 7.5X6 YLW CONV (MISCELLANEOUS) ×6 IMPLANT
PATCH VASCULAR VASCU GUARD 1X6 (Vascular Products) ×2 IMPLANT
SHUNT CAROTID BYPASS 10 (VASCULAR PRODUCTS) ×2 IMPLANT
SHUNT CAROTID BYPASS 12FRX15.5 (VASCULAR PRODUCTS) IMPLANT
SPONGE SURGIFOAM ABS GEL 100 (HEMOSTASIS) IMPLANT
SUT PROLENE 6 0 BV (SUTURE) ×5 IMPLANT
SUT PROLENE 7 0 BV 1 (SUTURE) IMPLANT
SUT SILK 2 0 FS (SUTURE) IMPLANT
SUT SILK 3 0 (SUTURE) ×3
SUT SILK 3 0 TIES 17X18 (SUTURE)
SUT SILK 3-0 18XBRD TIE 12 (SUTURE) IMPLANT
SUT SILK 3-0 18XBRD TIE BLK (SUTURE) IMPLANT
SUT VIC AB 3-0 SH 27 (SUTURE) ×3
SUT VIC AB 3-0 SH 27X BRD (SUTURE) ×1 IMPLANT
SUT VICRYL 4-0 PS2 18IN ABS (SUTURE) ×3 IMPLANT
SYR CONTROL 10ML LL (SYRINGE) ×3 IMPLANT
TOWEL OR 17X24 6PK STRL BLUE (TOWEL DISPOSABLE) ×3 IMPLANT
TOWEL OR 17X26 10 PK STRL BLUE (TOWEL DISPOSABLE) ×3 IMPLANT
WATER STERILE IRR 1000ML POUR (IV SOLUTION) ×3 IMPLANT

## 2013-08-09 NOTE — Progress Notes (Signed)
Red rash noted surrounding incision site on right neck. PA Trihn and Dr. Scot Dock aware, Dr. Scot Dock came to see pt, no new orders at this time. No s/s of acute distress noted.

## 2013-08-09 NOTE — Anesthesia Preprocedure Evaluation (Addendum)
Anesthesia Evaluation  Patient identified by MRN, date of birth, ID band Patient awake    Reviewed: Allergy & Precautions, H&P , NPO status , Patient's Chart, lab work & pertinent test results  History of Anesthesia Complications Negative for: history of anesthetic complications  Airway Mallampati: II TM Distance: >3 FB Neck ROM: Full    Dental  (+) Teeth Intact, Dental Advisory Given   Pulmonary Current Smoker,    Pulmonary exam normal       Cardiovascular hypertension, Pt. on medications + Peripheral Vascular Disease     Neuro/Psych PSYCHIATRIC DISORDERS Anxiety Depression Left-sided weakness CVA, Residual Symptoms    GI/Hepatic Neg liver ROS, hiatal hernia, GERD-  Medicated and Controlled,  Endo/Other  diabetes, Type 2, Insulin Dependent, Oral Hypoglycemic Agents  Renal/GU negative Renal ROS     Musculoskeletal  (+) Fibromyalgia -  Abdominal   Peds  Hematology   Anesthesia Other Findings   Reproductive/Obstetrics                         Anesthesia Physical Anesthesia Plan  ASA: III  Anesthesia Plan: General   Post-op Pain Management:    Induction: Intravenous  Airway Management Planned: Oral ETT  Additional Equipment: Arterial line  Intra-op Plan:   Post-operative Plan: Extubation in OR  Informed Consent: I have reviewed the patients History and Physical, chart, labs and discussed the procedure including the risks, benefits and alternatives for the proposed anesthesia with the patient or authorized representative who has indicated his/her understanding and acceptance.   Dental advisory given  Plan Discussed with: CRNA, Anesthesiologist and Surgeon  Anesthesia Plan Comments:        Anesthesia Quick Evaluation

## 2013-08-09 NOTE — H&P (View-Only) (Signed)
[facility] [place]  Patient name: Morgan Daniels MRN: [id number] DOB: 04-Mar-1960 Sex: female  REASON FOR ADMISSION: Carotid disease with recent stroke.  HPI: Morgan Daniels is a 53 y.o. 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 She had a moderate left carotid stenosis and her arteriogram demonstrated significant irregularity of a 40-50% left internal carotid artery stenosis. She underwent left carotid endarterectomy with Dacron patch angioplasty in March of 2014. She was last seen in our office on 07/06/2013. At that time she had a 60-79% right internal carotid artery stenosis. The left carotid endarterectomy site was patent.  oon 07/29/2013 she developed the sudden onset of left upper extremity and left lower extremity weakness and paresthesias. She was admitted at [facility] for a right brain stroke. Her MRI showed small acute embolic infarcts in the right frontal and parietal lobes. She was started on Plavix. She was set up for a vascular follow up visit. She complains of some persistent weakness and paresthesias on the left although it has improved. She denies any significant expressive or receptive aphasia or amaurosis fugax.  Past Medical History  Diagnosis Date  . IBS (irritable bowel syndrome)   . Plantar fasciitis   . Fatty liver   . Diabetes   . Carotid artery occlusion   . Hypertension   . Anxiety   . Depression   . GERD (gastroesophageal reflux disease)   . H/O hiatal hernia   . Fibromyalgia   . Stroke Dec. 14,2013    Right side-ministroke   Family History  Problem Relation Age of Onset  . Hypertension Mother   . Hyperlipidemia Mother   . Deep vein thrombosis Mother   . Cancer Father   . Hypertension Maternal Grandmother   . Deep vein thrombosis Sister    SOCIAL HISTORY: History  Substance Use Topics  . Smoking status: Current Every Day Smoker -- 0.00 packs/day for 30 years    Types: Cigarettes    . Smokeless tobacco: Never Used  . Alcohol Use: 0.6 oz/week    1 Glasses of wine per week     Comment: once a month   Allergies  Allergen Reactions  . Simvastatin Diarrhea and Nausea And Vomiting  . Morphine And Related Itching  . Latex Rash   Current Outpatient Prescriptions  Medication Sig Dispense Refill  . ALPRAZolam (XANAX) 0.5 MG tablet Take 0.5 mg by mouth 2 (two) times daily.       [name] amitriptyline (ELAVIL) 100 MG tablet Take 100 mg by mouth at bedtime.      [name] aspirin EC 81 MG tablet Take 81 mg by mouth daily.      [name] atorvastatin (LIPITOR) 10 MG tablet Take 10 mg by mouth daily.      . bisoprolol (ZEBETA) 5 MG tablet Take 2.5 mg by mouth daily.       . Canagliflozin (INVOKANA) 100 MG TABS Take 100 mg by mouth daily.      . cetirizine (ZYRTEC) 10 MG tablet Take 10 mg by mouth daily.      . Cholecalciferol (VITAMIN D-3) 5000 UNITS TABS Take 5,000 Units by mouth daily.       . ciprofloxacin (CIPRO) 500 MG tablet Take 1 tablet (500 mg total) by mouth 2 (two) times daily.  6 tablet  0  . clopidogrel (PLAVIX) 75 MG tablet Take 75 mg by mouth daily with breakfast.       . FLUoxetine (PROZAC)  40 MG capsule Take 40 mg by mouth daily.      [name] gabapentin (NEURONTIN) 600 MG tablet Take 600 mg by mouth 2 (two) times daily.      [name] glimepiride (AMARYL) 4 MG tablet Take 4 mg by mouth daily after supper.      . insulin lispro (HUMALOG) 100 UNIT/ML injection Inject 4-6 Units into the skin 3 (three) times daily before meals. Sliding scale      . nicotine (NICODERM CQ - DOSED IN MG/24 HOURS) 21 mg/24hr patch Place 1 patch onto the skin daily.      [name] NOVOFINE 32G X 6 MM MISC       . ondansetron (ZOFRAN) 8 MG tablet Take 8 mg by mouth 2 (two) times daily as needed for nausea.       [name] oxyCODONE (OXY IR/ROXICODONE) 5 MG immediate release tablet Take 1-2 tablets (5-10 mg total) by mouth every 4 (four) hours as needed.  30 tablet  0  . pantoprazole (PROTONIX) 40 MG tablet 2 (two) times daily.      .  traMADol (ULTRAM) 50 MG tablet Take 50 mg by mouth every 6 (six) hours as needed for pain.      [name] triamcinolone cream (KENALOG) 0.1 % as needed.      [name] VICTOZA 18 MG/3ML SOPN daily. 1.2 mg      . dipyridamole (PERSANTINE) 75 MG tablet Take 1 tablet (75 mg total) by mouth 3 (three) times daily.  90 tablet  12  . hydrochlorothiazide (MICROZIDE) 12.5 MG capsule Take 12.5 mg by mouth daily.      . insulin detemir (LEVEMIR) 100 UNIT/ML injection Inject 50 Units into the skin at bedtime.       [name] omeprazole (PRILOSEC) 40 MG capsule Take 40 mg by mouth daily.       No current facility-administered medications for this visit.   REVIEW OF SYSTEMS: Valu.Nieves ] denotes positive finding; [  ] denotes negative finding  CARDIOVASCULAR:  [ ]  chest pain   [ ]  chest pressure   [ ]  palpitations   [ ]  orthopnea   [ ]  dyspnea on exertion   [ ]  claudication   [ ]  rest pain   [ ]  DVT   [ ]  phlebitis PULMONARY:   [ ]  productive cough   [ ]  asthma   [ ]  wheezing NEUROLOGIC:   Valu.Nieves ] weakness  Valu.Nieves ] paresthesias  [ ]  aphasia  [ ]  amaurosis  [ ]  dizziness HEMATOLOGIC:   [ ]  bleeding problems   [ ]  clotting disorders MUSCULOSKELETAL:  [ ]  joint pain   [ ]  joint swelling [ ]  leg swelling GASTROINTESTINAL: [ ]   blood in stool  [ ]   hematemesis GENITOURINARY:  [ ]   dysuria  [ ]   hematuria PSYCHIATRIC:  [ ]  history of major depression INTEGUMENTARY:  [ ]  rashes  [ ]  ulcers CONSTITUTIONAL:  [ ]  fever   [ ]  chills  PHYSICAL EXAM: Filed Vitals:   08/03/13 1130 08/03/13 1133  BP: 136/92 125/87  Pulse: 109   Height: 5\' 7"  (1.702 m)   Weight: 202 lb (91.627 kg)   SpO2: 96%    Body mass index is 31.63 kg/(m^2). GENERAL: The patient is a well-nourished female, in no acute distress. The vital signs are documented above. CARDIOVASCULAR: There is a regular rate and rhythm. I do not detect carotid bruits. PULMONARY: There is good air exchange bilaterally without wheezing or rales. ABDOMEN: Soft and non-tender with  normal pitched  bowel sounds.  MUSCULOSKELETAL: There are no major deformities or cyanosis. NEUROLOGIC: She has some mild paresthesias in the left upper extremity and left lower extremity. She has fairly good strength bilaterally. SKIN: There are no ulcers or rashes noted. PSYCHIATRIC: The patient has a normal affect.  DATA:  I have reviewed her duplex scan which shows a 60-79% right carotid stenosis with no significant recurrent stenosis on the left. I've also reviewed her MRI which shows small acute embolic infarction the right frontal and parietal lobes.  MEDICAL ISSUES:  Occlusion and stenosis of carotid artery with cerebral infarction This patient had a recent right brain stroke on 07/29/2013. She has a 60-79% right carotid stenosis. I have recommended right carotid endarterectomy. I have reviewed the indications for carotid endarterectomy, that is to lower the risk of future stroke. I have also reviewed the potential complications of surgery, including but not limited to: bleeding, stroke (perioperative risk 1-2%), MI, nerve injury of other unpredictable medical problems. All of the patients questions were answered and they are agreeable to proceed with surgery. I have instructed the patient to continue taking her aspirin daily. We will have her stop her Plavix 5 days prior to surgery. She knows to continue her statin. Her surgery is scheduled for 08/09/2013.   I have discussed with the patient the nature of atherosclerosis, and emphasized the importance of maximal medical management including control of blood pressure, blood glucose, and cholesterol levels, antiplatelet agents, obtaining regular exercise, and cessation of smoking. The patient is aware that without maximal medical management the underlying atherosclerotic disease process will progress and also limit the benefit of any interventions.  [place] [facility] [place] Beeper: [phone]

## 2013-08-09 NOTE — Anesthesia Procedure Notes (Addendum)
Procedure Name: Intubation Date/Time: 08/09/2013 7:40 AM Performed by: Jenne Campus Pre-anesthesia Checklist: Patient identified, Timeout performed, Emergency Drugs available, Suction available and Patient being monitored Patient Re-evaluated:Patient Re-evaluated prior to inductionOxygen Delivery Method: Circle system utilized Preoxygenation: Pre-oxygenation with 100% oxygen Intubation Type: IV induction Ventilation: Mask ventilation without difficulty Laryngoscope Size: Miller and 2 Grade View: Grade II Tube type: Oral Tube size: 7.0 mm Number of attempts: 1 Airway Equipment and Method: Stylet Placement Confirmation: ETT inserted through vocal cords under direct vision,  positive ETCO2 and breath sounds checked- equal and bilateral Secured at: 23 cm Tube secured with: Tape Dental Injury: Teeth and Oropharynx as per pre-operative assessment  Comments: Performed by Wetzel Bjornstad SRNA

## 2013-08-09 NOTE — Op Note (Signed)
    NAME: Morgan Daniels   MRN: [id number] DOB: 1960-12-28    DATE OF OPERATION: 08/09/2013  PREOP DIAGNOSIS: Symptomatic right carotid stenosis  POSTOP DIAGNOSIS: same  PROCEDURE: right carotid endarterectomy with bovine pericardial patch angioplasty  SURGEON: Judeth Cornfield. Scot Dock, MD, FACS  ASSIST: Silva Bandy, [facility]  ANESTHESIA: Gen.   EBL: minimal  INDICATIONS: [name] is a 53 y.o. female who had a right brain stroke. She had a 60-79% right carotid stenosis and presents for right carotid endarterectomy.  FINDINGS: The patient had a hemorrhagic plaque that was approximately 70%.  TECHNIQUE: The patient was taken to the operating room and received a general anesthetic. An arterial line had been placed by anesthesia. After careful positioning, the neck and upper chest were prepped and draped in usual sterile fashion. An incision was made along the anterior border of the sternocleidomastoid and the dissection carried down to the common carotid artery which was dissected free and controlled with a Rummel tourniquet. The facial vein was divided between 2-0 silk ties. Of note the bifurcation was high and the plaque extended up fairly high making dissection somewhat difficult. I had to fully mobilize the hypoglossal nerve and dissected up to the level of the digastric muscle. I was able to control the internal carotid artery above the plaque. The external carotid artery and superior thyroid arteries were also controlled. The patient was heparinized. Clamps were then placed on the internal then the external then the common carotid artery. A longitudinal arteriotomy was made in the common carotid artery and this was extended through the plaque into the internal carotid artery. A 10 shunt was placed into the internal carotid artery, back bled and then placed into the common carotid artery and secured with Rummel tourniquet. Flow is reestablished the shunt. An endarterectomy plane was  established proximally. Eversion endarterectomy was performed of the external carotid artery. Distally, there was a nice taper in the plaque and no tacking sutures were required. The artery was irrigated with segments of heparin all loose debris removed. The bovine pericardial patch was then sewn using continuous 6-0 Prolene suture. Prior to completing the closure, the artery was backbled and flushed properly after the shunt was removed. The anastomosis was completed and flow reestablished first to the external carotid artery then to the internal carotid artery. At the completion there was an excellent arterial Doppler signal in the internal carotid artery above the patch. There was a good pulse. The heparin was partially reversed with protamine. The wound was closed [place] Vicryl, the platysma was closed with running 3-0 Vicryl, and the skin was closed with a 4-0 subcuticular stitch. Dermabond was applied. The patient tolerated the procedure well and awoke neurologically intact. All needle and sponge counts were correct.  Deitra Mayo, MD, FACS [facility]  DATE OF DICTATION:   08/09/2013

## 2013-08-09 NOTE — Transfer of Care (Signed)
Immediate Anesthesia Transfer of Care Note  Patient: Morgan Daniels  Procedure(s) Performed: Procedure(s): ENDARTERECTOMY CAROTID-RIGHT (Right)  Patient Location: PACU  Anesthesia Type:General  Level of Consciousness: awake, alert , oriented and patient cooperative  Airway & Oxygen Therapy: Patient Spontanous Breathing and Patient connected to nasal cannula oxygen  Post-op Assessment: Report given to PACU RN and Post -op Vital signs reviewed and stable  Post vital signs: Reviewed  Complications: No apparent anesthesia complications

## 2013-08-09 NOTE — Interval H&P Note (Signed)
History and Physical Interval Note:  08/09/2013 7:22 AM  Morgan Daniels  has presented today for surgery, with the diagnosis of Right Internal Carotid Artery Stenosis  The various methods of treatment have been discussed with the patient and family. After consideration of risks, benefits and other options for treatment, the patient has consented to  Procedure(s): ENDARTERECTOMY CAROTID-RIGHT (Right) as a surgical intervention .  The patient's history has been reviewed, patient examined, no change in status, stable for surgery.  I have reviewed the patient's chart and labs.  Questions were answered to the patient's satisfaction.     Dickie Labarre S

## 2013-08-09 NOTE — Progress Notes (Signed)
   VASCULAR PROGRESS NOTE  SUBJECTIVE: Pain well controlled  PHYSICAL EXAM: Filed Vitals:   08/09/13 1100 08/09/13 1113 08/09/13 1221 08/09/13 1326  BP:  116/71 115/74 117/69  Pulse: 95 98 93 91  Temp:      TempSrc:      Resp: 23 13 17 19   Height:  5' 6.93" (1.7 m)    Weight:  202 lb 9.6 oz (91.9 kg)    SpO2: 92% 92% 96% 96%   Incision looks fine. Neuro intact   Recent Labs  08/09/13 0551 08/09/13 1022  GLUCAP 148* 128*   Active Problems:   Carotid stenosis  ASSESSMENT AND PLAN:  * Stable post op.  Gae Gallop Beeper: [phone] 08/09/2013

## 2013-08-10 ENCOUNTER — Telehealth: Payer: Self-pay | Admitting: Vascular Surgery

## 2013-08-10 LAB — BASIC METABOLIC PANEL
BUN: 9 mg/dL (ref 6–23)
CHLORIDE: 96 meq/L (ref 96–112)
CO2: 28 mEq/L (ref 19–32)
CREATININE: 0.48 mg/dL — AB (ref 0.50–1.10)
Calcium: 9.6 mg/dL (ref 8.4–10.5)
GFR calc non Af Amer: >90 mL/min (ref >90)
Glucose, Bld: 227 mg/dL — ABNORMAL HIGH (ref 70–99)
POTASSIUM: 4.6 meq/L (ref 3.7–5.3)
Sodium: 139 mEq/L (ref 137–147)

## 2013-08-10 LAB — GLUCOSE, CAPILLARY
GLUCOSE-CAPILLARY: 200 mg/dL — AB (ref 70–99)
Glucose-Capillary: 235 mg/dL — ABNORMAL HIGH (ref 70–99)

## 2013-08-10 LAB — CBC
HCT: 41 % (ref 36.0–46.0)
Hemoglobin: 13.6 g/dL (ref 12.0–15.0)
MCH: 30 pg (ref 26.0–34.0)
MCHC: 33.2 g/dL (ref 30.0–36.0)
MCV: 90.3 fL (ref 78.0–100.0)
PLATELETS: 252 10*3/uL (ref 150–400)
RBC: 4.54 MIL/uL (ref 3.87–5.11)
RDW: 13.1 % (ref 11.5–15.5)
WBC: 10.5 10*3/uL (ref 4.0–10.5)

## 2013-08-10 MED ORDER — LOPERAMIDE HCL 2 MG PO CAPS
4.0000 mg | ORAL_CAPSULE | ORAL | Status: DC | PRN
  Administered 2013-08-10: 4 mg via ORAL
  Administered 2013-08-10: 2 mg via ORAL
  Filled 2013-08-10 (×2): qty 2

## 2013-08-10 NOTE — Evaluation (Signed)
Physical Therapy Evaluation Patient Details Name: Morgan Daniels MRN: [id number] DOB: 10-Aug-1960 Today's Date: 08/10/2013   History of Present Illness  Morgan Daniels is a 53 y.o. female who had a right brain stroke. She had a 60-79% right carotid stenosis and presents s/p right carotid endarterectomy  Clinical Impression  Patient demonstrates deficits in mobility as indicated below. Patient with good overall ambulation but decreased activity tolerance.  Patient ambulated on 2 liters and remained >92%, on 1 liter remained >90% and on room air dropped to 88%. Despite desaturation patient rebounded very quickly with standing rest break and deep inspirations. Encouraged patient to use incentive spirometer and ambulate with staff.  Pt does have baseline gait deviation from previous CVA for which patient uses cane when fatigued. Patient will benefit from HHPT to address mobility and activity tolerance.     Follow Up Recommendations Home health PT    Equipment Recommendations  None recommended by PT    Recommendations for Other Services       Precautions / Restrictions Restrictions Weight Bearing Restrictions: No      Mobility  Bed Mobility               General bed mobility comments: received in chair  Transfers Overall transfer level: Modified independent               General transfer comment: increased time  Ambulation/Gait Ambulation/Gait assistance: Modified independent (Device/Increase time) Ambulation Distance (Feet): 310 Feet Assistive device:  (pushing w/c) Gait Pattern/deviations: Step-through pattern Gait velocity: decreased Gait velocity interpretation: Below normal speed for age/gender General Gait Details: 3 standing rest breaks for O2 adjustments and monitoring  Stairs            Wheelchair Mobility    Modified Rankin (Stroke Patients Only)       Balance Overall balance assessment: No apparent balance deficits (not formally  assessed)                                           Pertinent Vitals/Pain 3/10 pain, spo2 with ambulation on 2 liters and remained >92%, on 1 liter remained >90% and on room air dropped to 88%.    Home Living Family/patient expects to be discharged to:: Private residence Living Arrangements: Spouse/significant other Available Help at Discharge: Family Type of Home: House Home Access: Stairs to enter Entrance Stairs-Rails: None Entrance Stairs-Number of Steps: 2 Home Layout: One level Home Equipment: Environmental consultant - 2 wheels;Cane - single point      Prior Function Level of Independence: Independent         Comments: occassional use of straight cane     Hand Dominance   Dominant Hand: Right    Extremity/Trunk Assessment   Upper Extremity Assessment: Overall WFL for tasks assessed           Lower Extremity Assessment: Generalized weakness (LLE residual weakness from prior CVA)         Communication   Communication: No difficulties  Cognition Arousal/Alertness: Awake/alert Behavior During Therapy: WFL for tasks assessed/performed Overall Cognitive Status: Within Functional Limits for tasks assessed                      General Comments      Exercises        Assessment/Plan    PT Assessment All further PT needs can be met  in the next venue of care  PT Diagnosis Difficulty walking;Generalized weakness   PT Problem List Decreased strength;Decreased activity tolerance  PT Treatment Interventions     PT Goals (Current goals can be found in the Care Plan section) Acute Rehab PT Goals Patient Stated Goal: to go home PT Goal Formulation: With patient Time For Goal Achievement: 08/24/13 Potential to Achieve Goals: Good    Frequency     Barriers to discharge        Co-evaluation               End of Session Equipment Utilized During Treatment: Gait belt;Oxygen Activity Tolerance: Patient tolerated treatment well Patient  left: in chair;with call bell/phone within reach Nurse Communication: Mobility status         Time: 9996-7227 PT Time Calculation (min): 17 min   Charges:   PT Evaluation $Initial PT Evaluation Tier I: 1 Procedure PT Treatments $Gait Training: 8-22 mins   PT G CodesDuncan Dull 08/10/2013, 4:03 PM  Alben Deeds, [place] DPT  [phone]

## 2013-08-10 NOTE — Progress Notes (Signed)
SATURATION QUALIFICATIONS: (This note is used to comply with regulatory documentation for home oxygen)  Patient Saturations on Room Air at Rest = 89%  Patient Saturations on Room Air while Ambulating = 84%  Patient Saturations on 4 Liters of oxygen while Ambulating = 87%  Please briefly explain why patient needs home oxygen:

## 2013-08-10 NOTE — Anesthesia Postprocedure Evaluation (Signed)
Anesthesia Post Note  Patient: Morgan Daniels  Procedure(s) Performed: Procedure(s) (LRB): ENDARTERECTOMY CAROTID-RIGHT (Right)  Anesthesia type: General  Patient location: PACU  Post pain: Pain level controlled and Adequate analgesia  Post assessment: Post-op Vital signs reviewed, Patient's Cardiovascular Status Stable, Respiratory Function Stable, Patent Airway and Pain level controlled  Last Vitals:  Filed Vitals:   08/10/13 1139  BP: 129/73  Pulse: 86  Temp: 36.9 C  Resp: 19    Post vital signs: Reviewed and stable  Level of consciousness: awake, alert  and oriented  Complications: No apparent anesthesia complications

## 2013-08-10 NOTE — Progress Notes (Signed)
D/C instructions given to pt. Pt verbalizes understanding of instructions. No s/s of acute distress noted.

## 2013-08-10 NOTE — Progress Notes (Signed)
Spoke with RN, patient's O2 sats were in upper 80's with ambulation yesterday on 4L. She has been weaned to 1L O2 this am and is currently at 92%. She is not on home oxygen. Will have PT eval this am before discharge home later today.   Virgina Jock, PA-C Vascular Surgery Pager [phone]

## 2013-08-10 NOTE — Telephone Encounter (Addendum)
Message copied by Gena Fray on Wed Aug 10, 2013 11:34 AM ------      Message from: Mena Goes      Created: Tue Aug 09, 2013 10:36 AM      Regarding: Schedule                   ----- Message -----         From: Angelia Mould, MD         Sent: 08/09/2013  10:18 AM           To: Vvs Charge Pool      Subject: charge and f/u                                           PROCEDURE: right carotid endarterectomy with bovine pericardial patch angioplasty            SURGEON: Judeth Cornfield. Scot Dock, MD, FACS            ASSIST: Silva Bandy, [facility]            She will need a f/u visit in 2-3 weeks.      Thanks      CD ------  08/10/13: left detailed message for pt, dpm

## 2013-08-10 NOTE — Discharge Summary (Signed)
[facility] Discharge Summary  Morgan Daniels 09/13/1960 53 y.o. female  [id number]  Admission Date: 08/09/2013  Discharge Date: 08/10/2013  Physician: No att. providers found  Admission Diagnosis: Right Internal Carotid Artery Stenosis   HPI:   This is a 53 y.o. 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 She had a moderate left carotid stenosis and her arteriogram demonstrated significant irregularity of a 40-50% left internal carotid artery stenosis. She underwent left carotid endarterectomy with Dacron patch angioplasty in March of 2014. She was last seen in our office on 07/06/2013. At that time she had a 60-79% right internal carotid artery stenosis. The left carotid endarterectomy site was patent.   On 07/29/2013 she developed the sudden onset of left upper extremity and left lower extremity weakness and paresthesias. She was admitted at [facility] for a right brain stroke. Her MRI showed small acute embolic infarcts in the right frontal and parietal lobes. She was started on Plavix. She was set up for a vascular follow up visit. She complains of some persistent weakness and paresthesias on the left although it has improved. She denies any significant expressive or receptive aphasia or amaurosis fugax.   Hospital Course:  The patient was admitted to the hospital and taken to the operating room on 08/09/2013 and underwent right carotid endarterectomy.  The pt tolerated the procedure well and was transported to the PACU in good condition.  By POD 1, the pt neuro status was intact. She had some mild swelling around her incision. Her nurse reported her oxygen saturation was low when she was ambulating. She is not on home oxygen. A physical therapy evaluation was performed and it was found that her overall ambulation was good but with decreased activity tolerance. However, the patient was able to rebound quickly with standing rest  breaks and deep respirations. The patient does have baseline gait deviations from her previous stroke.   The remainder of the hospital course consisted of increasing mobilization and increasing intake of solids without difficulty. She was discharged home on POD 1.     Recent Labs  08/08/13 1038 08/10/13 0430  NA 135* 139  K 4.5 4.6  CL 95* 96  CO2 23 28  GLUCOSE 122* 227*  BUN 15 9  CALCIUM 10.1 9.6    Recent Labs  08/08/13 1038 08/10/13 0430  WBC 12.8* 10.5  HGB 15.9* 13.6  HCT 46.3* 41.0  PLT 327 252    Recent Labs  08/08/13 1038  INR 0.98    Discharge Instructions:   The patient is discharged to home with extensive instructions on wound care and progressive ambulation.  They are instructed not to drive or perform any heavy lifting until returning to see the physician in his office.  Discharge Instructions   CAROTID Sugery: Call MD for difficulty swallowing or speaking; weakness in arms or legs that is a new symtom; severe headache.  If you have increased swelling in the neck and/or  are having difficulty breathing, CALL [phone]    Complete by:  As directed      Call MD for:  redness, tenderness, or signs of infection (pain, swelling, bleeding, redness, odor or green/yellow discharge around incision site)    Complete by:  As directed      Call MD for:  severe or increased pain, loss or decreased feeling  in affected limb(s)    Complete by:  As directed      Call MD  for:  temperature >100.5    Complete by:  As directed      Driving Restrictions    Complete by:  As directed   No driving for 2 weeks     Lifting restrictions    Complete by:  As directed   No lifting for 2 weeks     Resume previous diet    Complete by:  As directed      may wash over wound with mild soap and water    Complete by:  As directed   May shower on 08/11/13           Discharge Diagnosis:  Right Internal Carotid Artery Stenosis  Secondary Diagnosis: Patient Active Problem List    Diagnosis Date Noted  . Carotid stenosis 08/09/2013  . Occlusion and stenosis of carotid artery with cerebral infarction 08/03/2013  . Aftercare following surgery of the circulatory system, [place] 12/08/2012  . Tenderness-Left neck 12/08/2012  . Pain in neck 05/12/2012  . Scalp pain 05/12/2012  . Occlusion and stenosis of carotid artery without mention of cerebral infarction 04/28/2012  . HYPERCHOLESTEROLEMIA 06/01/2006  . PANIC ATTACK 06/01/2006  . TOBACCO ABUSE 06/01/2006  . DEPRESSION 06/01/2006  . ALLERGIC RHINITIS 06/01/2006  . GERD 06/01/2006  . DIVERTICULAR DISEASE 06/01/2006  . MIGRAINES, HX OF 06/01/2006   Past Medical History  Diagnosis Date  . IBS (irritable bowel syndrome)   . Plantar fasciitis   . Fatty liver   . Diabetes   . Carotid artery occlusion   . Hypertension   . Anxiety   . Depression   . GERD (gastroesophageal reflux disease)   . H/O hiatal hernia   . Fibromyalgia   . Stroke Dec. 14,2013    Right side-ministroke      Medication List         ALPRAZolam 0.5 MG tablet  Commonly known as:  XANAX  Take 0.5 mg by mouth 3 (three) times daily.     amitriptyline 100 MG tablet  Commonly known as:  ELAVIL  Take 100 mg by mouth at bedtime.     aspirin EC 81 MG tablet  Take 81 mg by mouth daily.     atorvastatin 10 MG tablet  Commonly known as:  LIPITOR  Take 10 mg by mouth daily.     bisoprolol 5 MG tablet  Commonly known as:  ZEBETA  Take 2.5 mg by mouth daily.     cetirizine 10 MG tablet  Commonly known as:  ZYRTEC  Take 10 mg by mouth daily.     clopidogrel 75 MG tablet  Commonly known as:  PLAVIX  Take 75 mg by mouth daily with breakfast.     FLUoxetine 40 MG capsule  Commonly known as:  PROZAC  Take 40 mg by mouth daily.     gabapentin 300 MG capsule  Commonly known as:  NEURONTIN  Take 600 mg by mouth 2 (two) times daily.     glimepiride 4 MG tablet  Commonly known as:  AMARYL  Take 4 mg by mouth 2 (two) times daily.      hydrochlorothiazide 12.5 MG capsule  Commonly known as:  MICROZIDE  Take 12.5 mg by mouth daily.     insulin lispro 100 UNIT/ML injection  Commonly known as:  HUMALOG  Inject 4-6 Units into the skin 3 (three) times daily before meals. Sliding scale     nicotine 21 mg/24hr patch  Commonly known as:  NICODERM CQ - dosed in mg/24 hours  Place 1 patch onto the skin daily.     NOVOFINE 32G X 6 MM Misc  Generic drug:  Insulin Pen Needle     ondansetron 8 MG tablet  Commonly known as:  ZOFRAN  Take 8 mg by mouth 2 (two) times daily as needed for nausea.     pantoprazole 40 MG tablet  Commonly known as:  PROTONIX  Take 40 mg by mouth 2 (two) times daily.     traMADol 50 MG tablet  Commonly known as:  ULTRAM  Take 2 tablets (100 mg total) by mouth 2 (two) times daily.     VICTOZA 18 MG/3ML Sopn  Generic drug:  Liraglutide  1.2 mg daily.     Vitamin D-3 5000 UNITS Tabs  Take 5,000 Units by mouth daily.        Tramadol 50 mg Number 30, No Refill  Disposition: Home  Patient's condition: is Good  Follow up: 1. Dr.  Scot Dock in 2 weeks.   Virgina Jock, PA-C [facility] [phone]  --- For [facility] use --- Instructions: Press F2 to tab through selections.  Delete question if not applicable.   Modified Rankin score at D/C (0-6): 0  IV medication needed for:  1. Hypertension: No 2. Hypotension: No  Post-op Complications: No  1. Post-op CVA or TIA: No  If yes: Event classification (right eye, left eye, right cortical, left cortical, verterobasilar, other):   If yes: Timing of event (intra-op, <6 hrs post-op, >=6 hrs post-op, unknown):   2. CN injury: No  If yes: CN  injuried   3. Myocardial infarction: No  If yes: Dx by (EKG or clinical, Troponin):   4.  CHF: No  5.  Dysrhythmia (new): No  6. Wound infection: No  7. Reperfusion symptoms: No  8. Return to OR: No  If yes: return to OR for (bleeding, neurologic, other CEA  incision, other):   Discharge medications: Statin use:  Yes If No: [ ]  For Medical reasons, [ ]  Non-compliant, [ ]  Not-indicated ASA use:  Yes  If No: [ ]  For Medical reasons, [ ]  Non-compliant, [ ]  Not-indicated Beta blocker use:  Yes If No: [ ]  For Medical reasons, [ ]  Non-compliant, [ ]  Not-indicated ACE-Inhibitor use:  No If No: [ ]  For Medical reasons, [ ]  Non-compliant, [x]  Not-indicated P2Y12 Antagonist use: Yes, [ x] Plavix, [ ]  Plasugrel, [ ]  Ticlopinine, [ ]  Ticagrelor, [ ]  Other, [ ]  No for medical reason, [ ]  Non-compliant, [ ]  Not-indicated Anti-coagulant use:  No, [ ]  Warfarin, [ ]  Rivaroxaban, [ ]  Dabigatran, [ ]  Other, [ ]  No for medical reason, [ ]  Non-compliant, [ x] Not-indicated

## 2013-08-10 NOTE — Progress Notes (Signed)
   VASCULAR PROGRESS NOTE  SUBJECTIVE: Pain adequately controlled  PHYSICAL EXAM: Filed Vitals:   08/09/13 2200 08/09/13 2341 08/10/13 0304 08/10/13 0400  BP: 138/76   149/82  Pulse: 97   102  Temp:  98.7 F (37.1 C) 98.3 F (36.8 C)   TempSrc:  Oral Oral   Resp: 21   22  Height:      Weight:      SpO2: 93%   93%   Mild swelling at incision Neuro intact  LABS: Lab Results  Component Value Date   WBC 10.5 08/10/2013   HGB 13.6 08/10/2013   HCT 41.0 08/10/2013   MCV 90.3 08/10/2013   PLT 252 08/10/2013   Lab Results  Component Value Date   CREATININE 0.48* 08/10/2013   Lab Results  Component Value Date   INR 0.98 08/08/2013   CBG (last 3)   Recent Labs  08/09/13 1022 08/09/13 1731 08/09/13 2132  GLUCAP 128* 101* 194*    Active Problems:   Carotid stenosis   ASSESSMENT AND PLAN:  * 1 Day Post-Op s/p: R CEA  *  Home today.  * She is trying to quit tobacco. She is on a statin and is on ASA.  Gae Gallop Beeper: [phone] 08/10/2013

## 2013-08-11 ENCOUNTER — Encounter (HOSPITAL_COMMUNITY): Payer: Self-pay | Admitting: Vascular Surgery

## 2013-08-15 NOTE — Discharge Summary (Signed)
Agree with plans for D/C.  Deitra Mayo, MD, [place] [phone] 08/15/2013

## 2013-08-18 ENCOUNTER — Encounter: Payer: Self-pay | Admitting: Neurology

## 2013-08-18 ENCOUNTER — Ambulatory Visit (INDEPENDENT_AMBULATORY_CARE_PROVIDER_SITE_OTHER): Payer: 59 | Admitting: Neurology

## 2013-08-18 VITALS — BP 140/90 | HR 66 | Temp 98.1°F | Resp 18 | Ht 68.0 in | Wt 202.0 lb

## 2013-08-18 DIAGNOSIS — F329 Major depressive disorder, single episode, unspecified: Secondary | ICD-10-CM

## 2013-08-18 DIAGNOSIS — F32A Depression, unspecified: Secondary | ICD-10-CM

## 2013-08-18 DIAGNOSIS — I69919 Unspecified symptoms and signs involving cognitive functions following unspecified cerebrovascular disease: Secondary | ICD-10-CM

## 2013-08-18 DIAGNOSIS — I63239 Cerebral infarction due to unspecified occlusion or stenosis of unspecified carotid arteries: Secondary | ICD-10-CM

## 2013-08-18 DIAGNOSIS — I679 Cerebrovascular disease, unspecified: Secondary | ICD-10-CM

## 2013-08-18 DIAGNOSIS — I739 Peripheral vascular disease, unspecified: Secondary | ICD-10-CM

## 2013-08-18 DIAGNOSIS — F3289 Other specified depressive episodes: Secondary | ICD-10-CM

## 2013-08-18 DIAGNOSIS — I779 Disorder of arteries and arterioles, unspecified: Secondary | ICD-10-CM

## 2013-08-18 NOTE — Progress Notes (Signed)
NEUROLOGY CONSULTATION NOTE  Morgan Daniels MRN: [id number] DOB: Aug 16, 1960  Referring provider: Dr. Scot Dock Primary care provider: Dr. Humphrey Rolls  Reason for consult:  stroke  HISTORY OF PRESENT ILLNESS: Morgan Daniels is a 53 year old right-handed woman with history of CVA, carotid artery disease, smoker, fibromyalgia, diabetes, and IBS who presents for stroke.  Records and images personally reviewed.  On January 31, 2012, she had sudden onset right hand tingling and right leg weakness, as well as disorientation and speech difficulty.  She was admitted to [facility], where MRI of the brain showed right parietal stroke.  Echo was reportedly unremarkable.  She was found to have 57% left carotid stenosis.  Arteriogram revealed 40-50% stenosis.  She was started on Aggrenox.  Plavix was not started because she was taking Prilosec.  Aggrenox was not affordable, so she was taking combination dipyridamole 75mg  and ASA 81mg  instead.  Eye exam revealed arterial blockage in the periphery of the retina in the left eye.  She developed amarosis fugax in the left eye.  She underwent left CEA with Dacron patch angioplasty in March 2014.  At that time, she had 60-79% right ICA stenosis.  Since this stroke, she reports short term memory problems and problems processing information.  She frequently forgets conversations and misplaces objects such as her keys or i-pad.  She does have depression and is treated with Elavil, Prozac and Xanax. Her maternal grandmother had history of dementia. Her mother may be experiencing early onset dementia.  She was admitted to [facility] on 07/29/13, after developing sudden-onset left upper extremity and lower extremity weakness and paresthesias.  MRI of the brain revealed small acute embolic infarcts in the right frontal and parietal lobes.  She was started on Plavix and ASA 81mg .  She followed up with vascular surgery and had a right  CEA performed on 08/09/13.    She currently is doing well from a physical standpoint.  She feels fatigue since the surgery.  She feels that her left knee buckles at times.  Current medications include:  ASA 81mg , Plavix, atorvastatin 10mg , Humalog, Levemir, Invokana, hydrochlorothiazide, Zebeta, chantix.  PAST MEDICAL HISTORY: Past Medical History  Diagnosis Date  . IBS (irritable bowel syndrome)   . Plantar fasciitis   . Fatty liver   . Diabetes   . Carotid artery occlusion   . Hypertension   . Anxiety   . Depression   . GERD (gastroesophageal reflux disease)   . H/O hiatal hernia   . Fibromyalgia   . Stroke Dec. 14,2013    Right side-ministroke    PAST SURGICAL HISTORY: Past Surgical History  Procedure Laterality Date  . Back surgery  2000, 2004  . Hernia repair    . Abdominal hysterectomy  1990  . Cholecystectomy  2001  . Endarterectomy Left 05/06/2012    Procedure: ENDARTERECTOMY CAROTID;  Surgeon: Angelia Mould, MD;  Location: [place];  Service: Vascular;  Laterality: Left;  . Patch angioplasty Left 05/06/2012    Procedure: WITH DACRON PATCH ANGIOPLASTY ;  Surgeon: Angelia Mould, MD;  Location: [place];  Service: Vascular;  Laterality: Left;  . Carotid endarterectomy Left 05/06/12  . Spine surgery  2004  . Endarterectomy Right 08/09/2013    Procedure: ENDARTERECTOMY CAROTID-RIGHT;  Surgeon: Angelia Mould, MD;  Location: [facility] OR;  Service: Vascular;  Laterality: Right;    MEDICATIONS: Current Outpatient Prescriptions on File Prior to Visit  Medication Sig Dispense Refill  .  ALPRAZolam (XANAX) 0.5 MG tablet Take 0.5 mg by mouth 3 (three) times daily.       [name] amitriptyline (ELAVIL) 100 MG tablet Take 100 mg by mouth at bedtime.      [name] aspirin EC 81 MG tablet Take 81 mg by mouth daily.      [name] atorvastatin (LIPITOR) 10 MG tablet Take 10 mg by mouth daily.      . bisoprolol (ZEBETA) 5 MG tablet Take 2.5 mg by mouth daily.      . cetirizine (ZYRTEC) 10 MG  tablet Take 10 mg by mouth daily.      . Cholecalciferol (VITAMIN D-3) 5000 UNITS TABS Take 5,000 Units by mouth daily.       . clopidogrel (PLAVIX) 75 MG tablet Take 75 mg by mouth daily with breakfast.       . FLUoxetine (PROZAC) 40 MG capsule Take 40 mg by mouth daily.      [name] gabapentin (NEURONTIN) 300 MG capsule Take 600 mg by mouth 2 (two) times daily.      [name] glimepiride (AMARYL) 4 MG tablet Take 4 mg by mouth 2 (two) times daily.       . hydrochlorothiazide (MICROZIDE) 12.5 MG capsule Take 12.5 mg by mouth daily.      . insulin lispro (HUMALOG) 100 UNIT/ML injection Inject 4-6 Units into the skin 3 (three) times daily before meals. Sliding scale      . NOVOFINE 32G X 6 MM MISC       . ondansetron (ZOFRAN) 8 MG tablet Take 8 mg by mouth 2 (two) times daily as needed for nausea.       . pantoprazole (PROTONIX) 40 MG tablet Take 40 mg by mouth 2 (two) times daily.       . traMADol (ULTRAM) 50 MG tablet Take 2 tablets (100 mg total) by mouth 2 (two) times daily.  30 tablet  0  . VICTOZA 18 MG/3ML SOPN 1.2 mg daily.       . nicotine (NICODERM CQ - DOSED IN MG/24 HOURS) 21 mg/24hr patch Place 1 patch onto the skin daily.       No current facility-administered medications on file prior to visit.    ALLERGIES: Allergies  Allergen Reactions  . Simvastatin Diarrhea and Nausea And Vomiting  . Morphine And Related Itching  . Latex Rash    FAMILY HISTORY: Family History  Problem Relation Age of Onset  . Hypertension Mother   . Hyperlipidemia Mother   . Deep vein thrombosis Mother   . Cancer Father   . Hypertension Maternal Grandmother   . Deep vein thrombosis Sister     SOCIAL HISTORY: History   Social History  . Marital Status: Married    Spouse Name: N/A    Number of Children: N/A  . Years of Education: N/A   Occupational History  . Not on file.   Social History Main Topics  . Smoking status: Current Every Day Smoker -- 1.00 packs/day for 40 years    Types: Cigarettes    . Smokeless tobacco: Never Used     Comment: patient is trying to quit  . Alcohol Use: 0.6 oz/week    1 Glasses of wine per week     Comment: once a month  . Drug Use: No  . Sexual Activity: No     Comment: 4- 5 cigs per day    Other Topics Concern  . Not on file   Social History Narrative  . No  narrative on file    REVIEW OF SYSTEMS: Constitutional: No fevers, chills, or sweats, no generalized fatigue, change in appetite Eyes: No visual changes, double vision, eye pain Ear, nose and throat: No hearing loss, ear pain, nasal congestion, sore throat Cardiovascular: No chest pain, palpitations Respiratory:  No shortness of breath at rest or with exertion, wheezes GastrointestinaI: No nausea, vomiting, diarrhea, abdominal pain, fecal incontinence Genitourinary:  No dysuria, urinary retention or frequency Musculoskeletal:  No neck pain, back pain Integumentary: No rash, pruritus, skin lesions Neurological: as above Psychiatric: No depression, insomnia, anxiety Endocrine: No palpitations, fatigue, diaphoresis, mood swings, change in appetite, change in weight, increased thirst Hematologic/Lymphatic:  No anemia, purpura, petechiae. Allergic/Immunologic: no itchy/runny eyes, nasal congestion, recent allergic reactions, rashes  PHYSICAL EXAM: Filed Vitals:   08/18/13 0852  BP: 140/90  Pulse: 66  Temp: 98.1 F (36.7 C)  Resp: 18   General: No acute distress Head:  Normocephalic/atraumatic Neck: supple, no paraspinal tenderness, full range of motion, right sided surgical scar. Back: No paraspinal tenderness Heart: regular rate and rhythm Lungs: Clear to auscultation bilaterally. Vascular: No carotid bruits. Neurological Exam: Mental status: Dysphoric mood. Alert and oriented to person, place, and time, remote memory intact, recalled 3/5 words after 5 minutes, fund of knowledge intact, attention and concentration intact (made a couple mistakes when repeating numbers) missed 2/5  serial 7 subtraction, visual spatial and executive functioning intact in regards to trail making test and drawing a clock. She copied a cube facing the opposite direction however, abstract thinking intact, speech fluent and not dysarthric, language intact (able to name, repeat, and follow commands).  MoCA 25/30 Cranial nerves: CN I: not tested CN II: pupils equal, round and reactive to light, visual fields intact, fundi unremarkable, without vessel changes, exudates, hemorrhages or papilledema. CN III, IV, VI:  full range of motion, no nystagmus, no ptosis CN V: facial sensation intact CN VII: upper and lower face symmetric CN VIII: hearing intact CN IX, X: gag intact, uvula midline CN XI: sternocleidomastoid and trapezius muscles intact CN XII: tongue midline Bulk & Tone: normal, no fasciculations. Motor: 5 out of 5 throughout, fine finger movements intact Sensation: Temperature and vibration intact Deep Tendon Reflexes: 2+ throughout, toes downgoing Finger to nose testing: No dysmetria Heel to shin: No dysmetria Gait: Mild limp but no ataxia. Able to turn and walk in tandem. Romberg negative.  IMPRESSION: Right hemispheric ischemic infarct secondary to right carotid artery stenosis. Cerebrovascular disease Carotid artery disease Cognitive deficits, either sequela of stroke or related to depression Depression  PLAN: Continue ASA 81mg  and Plavix Continue statin therapy (LDL goal should be less than 70) Optimize blood pressure (should be less than 967 systolic or pre-operative pressures) and diabetes control Smoking cessation Exercise as per vascular surgery recommendations and Mediterranean diet. Will refer for formal neuropsychological testing to sort out either cognitive deficits secondary to stroke versus depression. Follow up in 3 months.  Thank you for allowing me to take part in the care of this patient.  Metta Clines, DO  CC: Deitra Mayo, MD (vascular  surgery)  Clayborn Bigness, MD (PCP)

## 2013-08-18 NOTE — Patient Instructions (Signed)
1.  Continue aspirin 81mg  daily and Plavix  2.  Continue Lipitor (LDL goal should be less than 70) 3.  Blood pressure control and diabetes control 4.  Stop smoking 5.  Exercise per Dr. Scot Dock.  Mediterranean diet 6.  Will refer for neuropsychological testing to address memory problems. 7.  Follow up in 3 months.

## 2013-08-31 ENCOUNTER — Encounter: Payer: Self-pay | Admitting: Vascular Surgery

## 2013-09-01 ENCOUNTER — Encounter: Payer: Medicare Other | Admitting: Vascular Surgery

## 2013-09-05 ENCOUNTER — Ambulatory Visit (INDEPENDENT_AMBULATORY_CARE_PROVIDER_SITE_OTHER): Payer: Medicare Other | Admitting: Podiatry

## 2013-09-05 ENCOUNTER — Encounter: Payer: Self-pay | Admitting: Podiatry

## 2013-09-05 ENCOUNTER — Ambulatory Visit (INDEPENDENT_AMBULATORY_CARE_PROVIDER_SITE_OTHER): Payer: Medicare Other

## 2013-09-05 VITALS — Ht 67.0 in | Wt 200.0 lb

## 2013-09-05 DIAGNOSIS — M722 Plantar fascial fibromatosis: Secondary | ICD-10-CM

## 2013-09-05 DIAGNOSIS — M779 Enthesopathy, unspecified: Secondary | ICD-10-CM

## 2013-09-05 DIAGNOSIS — I6529 Occlusion and stenosis of unspecified carotid artery: Secondary | ICD-10-CM

## 2013-09-05 NOTE — Progress Notes (Signed)
She presents today a chief complaint of painful plantar fibromas bilateral foot.  Objective: Have reviewed her past medical history medications allergies surgeries social history. Pulses are strongly palpable bilateral. Recently he has had a carotid endarterectomy right. She has a palpable nonpulsatile nodule to the medial band of the plantar fascia left foot a central flush nodule to the plantar aspect of the right foot.  Assessment: Plantar fibromatosis bilateral.  Plan: Discussed etiology pathology conservative versus surgical therapies. Injected 2 injections to the left foot. 1 to the right foot. And I will followup with her in 2 months.

## 2013-09-06 ENCOUNTER — Encounter: Payer: Self-pay | Admitting: Vascular Surgery

## 2013-09-07 ENCOUNTER — Ambulatory Visit (INDEPENDENT_AMBULATORY_CARE_PROVIDER_SITE_OTHER): Payer: Self-pay | Admitting: Vascular Surgery

## 2013-09-07 ENCOUNTER — Encounter: Payer: Self-pay | Admitting: Vascular Surgery

## 2013-09-07 VITALS — BP 129/83 | HR 88 | Ht 67.0 in | Wt 204.3 lb

## 2013-09-07 DIAGNOSIS — I6529 Occlusion and stenosis of unspecified carotid artery: Secondary | ICD-10-CM

## 2013-09-07 DIAGNOSIS — I63239 Cerebral infarction due to unspecified occlusion or stenosis of unspecified carotid arteries: Secondary | ICD-10-CM

## 2013-09-07 NOTE — Assessment & Plan Note (Signed)
The patient is doing well status post right carotid endarterectomy for asymptomatic right carotid stenosis. She's undergone previous left carotid endarterectomy without evidence of restenosis. I will see her back in one year for a routine follow up visit and duplex scan. This will also be her VQI follow up visit. She is on aspirin and is on Lipitor. She knows to call sooner if she has problems.

## 2013-09-07 NOTE — Progress Notes (Signed)
   Patient name: Morgan Daniels MRN: [id number] DOB: Dec 04, 1960 Sex: female  VQI PATIENT  REASON FOR VISIT: Follow up after right carotid endarterectomy.  HPI: Morgan Daniels is a 53 y.o. female who had a right brain stroke and was found to have a 60-79% right carotid stenosis. She underwent right carotid endarterectomy with bovine pericardial patch angioplasty on 08/09/2013. She did well postoperatively and was discharged on postoperative day #1. She comes in for a routine follow up visit. She has no specific complaints. She denies focal weakness or paresthesias. She denies problems swallowing or hoarseness. She is on aspirin and is on Plavix.  REVIEW OF SYSTEMS: Valu.Nieves ] denotes positive finding; [  ] denotes negative finding  CARDIOVASCULAR:  [ ]  chest pain   [ ]  dyspnea on exertion    CONSTITUTIONAL:  [ ]  fever   [ ]  chills  PHYSICAL EXAM: Filed Vitals:   09/07/13 0941 09/07/13 0945  BP: 134/87 129/83  Pulse: 88   Height: 5\' 7"  (1.702 m)   Weight: 204 lb 4.8 oz (92.67 kg)   SpO2: 93%    Body mass index is 31.99 kg/(m^2). GENERAL: The patient is a well-nourished female, in no acute distress. The vital signs are documented above. CARDIOVASCULAR: There is a regular rate and rhythm. PULMONARY: There is good air exchange bilaterally without wheezing or rales. Her incision is healing nicely. NEURO: She has no focal weakness or paresthesias to  MEDICAL ISSUES:  Occlusion and stenosis of carotid artery with cerebral infarction The patient is doing well status post right carotid endarterectomy for asymptomatic right carotid stenosis. She's undergone previous left carotid endarterectomy without evidence of restenosis. I will see her back in one year for a routine follow up visit and duplex scan. This will also be her VQI follow up visit. She is on aspirin and is on Lipitor. She knows to call sooner if she has problems.   [place] [facility]  [place] Beeper: [phone]

## 2013-09-07 NOTE — Addendum Note (Signed)
Addended by: Mena Goes on: 09/07/2013 10:33 AM   Modules accepted: Orders

## 2013-09-08 DIAGNOSIS — R413 Other amnesia: Secondary | ICD-10-CM | POA: Diagnosis not present

## 2013-09-22 DIAGNOSIS — F09 Unspecified mental disorder due to known physiological condition: Secondary | ICD-10-CM | POA: Diagnosis not present

## 2013-11-07 ENCOUNTER — Encounter: Payer: Self-pay | Admitting: Podiatry

## 2013-11-07 ENCOUNTER — Ambulatory Visit (INDEPENDENT_AMBULATORY_CARE_PROVIDER_SITE_OTHER): Payer: 59 | Admitting: Podiatry

## 2013-11-07 VITALS — BP 148/96 | HR 100 | Resp 12

## 2013-11-07 DIAGNOSIS — M722 Plantar fascial fibromatosis: Secondary | ICD-10-CM | POA: Diagnosis not present

## 2013-11-07 DIAGNOSIS — I6529 Occlusion and stenosis of unspecified carotid artery: Secondary | ICD-10-CM

## 2013-11-07 NOTE — Progress Notes (Signed)
She presents today states that her feet are still painful. She states they have improved from her initial visit.  Objective: Vital signs are stable she is alert and oriented x3. Plantar fibromas x2 to the plantar aspect of the right foot appear to be shrinking and x1 to the left foot hardly palpable at this point in time.  Assessment: Plantar fibromatosis x3 right x1 left. Largest measures less than 1 cm in diameter.  Plan: Injected 2 lesions today of the right foot Will lesion on the left foot both with Kenalog and local anesthetic the she continue to decrease in size and pain over the next 6 weeks.

## 2013-11-08 DIAGNOSIS — M069 Rheumatoid arthritis, unspecified: Secondary | ICD-10-CM | POA: Diagnosis not present

## 2013-11-08 DIAGNOSIS — I10 Essential (primary) hypertension: Secondary | ICD-10-CM | POA: Diagnosis not present

## 2013-11-08 DIAGNOSIS — E119 Type 2 diabetes mellitus without complications: Secondary | ICD-10-CM | POA: Diagnosis not present

## 2013-11-08 DIAGNOSIS — M25579 Pain in unspecified ankle and joints of unspecified foot: Secondary | ICD-10-CM | POA: Diagnosis not present

## 2013-11-08 DIAGNOSIS — I6789 Other cerebrovascular disease: Secondary | ICD-10-CM | POA: Diagnosis not present

## 2013-11-08 DIAGNOSIS — E782 Mixed hyperlipidemia: Secondary | ICD-10-CM | POA: Diagnosis not present

## 2013-11-08 DIAGNOSIS — F411 Generalized anxiety disorder: Secondary | ICD-10-CM | POA: Diagnosis not present

## 2013-11-08 DIAGNOSIS — I63239 Cerebral infarction due to unspecified occlusion or stenosis of unspecified carotid arteries: Secondary | ICD-10-CM | POA: Diagnosis not present

## 2013-11-18 ENCOUNTER — Ambulatory Visit (INDEPENDENT_AMBULATORY_CARE_PROVIDER_SITE_OTHER): Payer: 59 | Admitting: Neurology

## 2013-11-18 ENCOUNTER — Encounter: Payer: Self-pay | Admitting: Neurology

## 2013-11-18 VITALS — BP 140/82 | HR 74 | Temp 98.8°F | Resp 18 | Ht 67.0 in | Wt 210.1 lb

## 2013-11-18 DIAGNOSIS — G3184 Mild cognitive impairment, so stated: Secondary | ICD-10-CM

## 2013-11-18 DIAGNOSIS — F329 Major depressive disorder, single episode, unspecified: Secondary | ICD-10-CM | POA: Diagnosis not present

## 2013-11-18 DIAGNOSIS — F32A Depression, unspecified: Secondary | ICD-10-CM

## 2013-11-18 DIAGNOSIS — I63239 Cerebral infarction due to unspecified occlusion or stenosis of unspecified carotid arteries: Secondary | ICD-10-CM | POA: Diagnosis not present

## 2013-11-18 DIAGNOSIS — I6529 Occlusion and stenosis of unspecified carotid artery: Secondary | ICD-10-CM

## 2013-11-18 DIAGNOSIS — F411 Generalized anxiety disorder: Secondary | ICD-10-CM

## 2013-11-18 NOTE — Progress Notes (Signed)
NEUROLOGY FOLLOW UP OFFICE NOTE  Morgan Daniels [id number]  HISTORY OF PRESENT ILLNESS: Morgan Daniels is a 53 year old right-handed woman with history of CVA, carotid artery disease, smoker, fibromyalgia, diabetes, and IBS who follows up for history of stroke and cognitive deficits.  She is accompanied by her mother.  Records reviewed.  UPDATE: She underwent formal neuropsychological testing with Dr. Conley Canal on 09/08/13, which was consistent with cognitive disorder, specifically mild executive dysfunction in regards to impairment on cognitive tasks requiring complex planning and organization, initial auditory learning and independent retrieval.   Etiology likely related to stroke and mood disorder.  Testing also revealed depressive disorder and anxiety disorder.  Patient's responses on a personality assessment also suggests that she expresses symptoms of depression and anxiety through somatic means, probably predating the stroke.  She has also become proactive in smoking cessation, using vabor.  HISTORY: On January 31, 2012, she had sudden onset right hand tingling and right leg weakness, as well as disorientation and speech difficulty.  She was admitted to [facility], where MRI of the brain showed right parietal stroke.  Echo was reportedly unremarkable.  She was found to have 57% left carotid stenosis.  Arteriogram revealed 40-50% stenosis.  She was started on Aggrenox.  Plavix was not started because she was taking Prilosec.  Aggrenox was not affordable, so she was taking combination dipyridamole 75mg  and ASA 81mg  instead.  Eye exam revealed arterial blockage in the periphery of the retina in the left eye.  She developed amarosis fugax in the left eye.  She underwent left CEA with Dacron patch angioplasty in March 2014.  At that time, she had 60-79% right ICA stenosis.  Since this stroke, she reports short term memory problems and problems processing information.   She frequently forgets conversations and misplaces objects such as her keys or i-pad.  She does have depression and is treated with Elavil, Prozac and Xanax. Her maternal grandmother had history of dementia. Her mother may be experiencing early onset dementia.  She was admitted to [facility] on 07/29/13, after developing sudden-onset left upper extremity and lower extremity weakness and paresthesias.  MRI of the brain revealed small acute embolic infarcts in the right frontal and parietal lobes.  She was started on Plavix and ASA 81mg .  She followed up with vascular surgery and had a right CEA performed on 08/09/13.    She currently is doing well from a physical standpoint.  She feels fatigue since the surgery.  She feels that her left knee buckles at times.  PAST MEDICAL HISTORY: Past Medical History  Diagnosis Date  . IBS (irritable bowel syndrome)   . Plantar fasciitis   . Fatty liver   . Diabetes   . Carotid artery occlusion   . Hypertension   . Anxiety   . Depression   . GERD (gastroesophageal reflux disease)   . H/O hiatal hernia   . Fibromyalgia   . Stroke Dec. 14,2013    Right side-ministroke    MEDICATIONS: Current Outpatient Prescriptions on File Prior to Visit  Medication Sig Dispense Refill  . ALPRAZolam (XANAX) 0.5 MG tablet Take 0.5 mg by mouth 3 (three) times daily.       [name] amitriptyline (ELAVIL) 100 MG tablet Take 100 mg by mouth at bedtime.      [name] aspirin EC 81 MG tablet Take 81 mg by mouth daily.      [name] atorvastatin (LIPITOR) 10 MG tablet Take  10 mg by mouth daily.      . bisoprolol (ZEBETA) 5 MG tablet Take 2.5 mg by mouth daily.      . cetirizine (ZYRTEC) 10 MG tablet Take 10 mg by mouth daily.      . Cholecalciferol (VITAMIN D-3) 5000 UNITS TABS Take 5,000 Units by mouth daily.       . clopidogrel (PLAVIX) 75 MG tablet Take 75 mg by mouth daily with breakfast.       . FLUoxetine (PROZAC) 40 MG capsule Take 40 mg by mouth daily.      [name]  gabapentin (NEURONTIN) 300 MG capsule Take 600 mg by mouth 2 (two) times daily.      [name] glimepiride (AMARYL) 4 MG tablet Take 4 mg by mouth 2 (two) times daily.       . hydrochlorothiazide (MICROZIDE) 12.5 MG capsule Take 12.5 mg by mouth daily.      . insulin lispro (HUMALOG) 100 UNIT/ML injection Inject 4-6 Units into the skin 3 (three) times daily before meals. Sliding scale      . nicotine (NICODERM CQ - DOSED IN MG/24 HOURS) 21 mg/24hr patch Place 1 patch onto the skin daily.      [name] NOVOFINE 32G X 6 MM MISC       . ondansetron (ZOFRAN) 8 MG tablet Take 8 mg by mouth 2 (two) times daily as needed for nausea.       . pantoprazole (PROTONIX) 40 MG tablet Take 40 mg by mouth 2 (two) times daily.       . traMADol (ULTRAM) 50 MG tablet Take 2 tablets (100 mg total) by mouth 2 (two) times daily.  30 tablet  0  . VICTOZA 18 MG/3ML SOPN 1.2 mg daily.        No current facility-administered medications on file prior to visit.    ALLERGIES: Allergies  Allergen Reactions  . Simvastatin Diarrhea and Nausea And Vomiting  . Morphine And Related Itching  . Latex Rash    FAMILY HISTORY: Family History  Problem Relation Age of Onset  . Hypertension Mother   . Hyperlipidemia Mother   . Deep vein thrombosis Mother   . Cancer Father   . Hypertension Maternal Grandmother   . Deep vein thrombosis Sister     SOCIAL HISTORY: History   Social History  . Marital Status: Married    Spouse Name: N/A    Number of Children: N/A  . Years of Education: N/A   Occupational History  . Not on file.   Social History Main Topics  . Smoking status: Former Smoker -- 1.00 packs/day for 40 years    Types: Cigarettes  . Smokeless tobacco: Never Used     Comment: using vabor   . Alcohol Use: 0.6 oz/week    1 Glasses of wine per week     Comment: once a month  . Drug Use: No  . Sexual Activity: No   Other Topics Concern  . Not on file   Social History Narrative  . No narrative on file    REVIEW  OF SYSTEMS: Constitutional: No fevers, chills, or sweats, no generalized fatigue, change in appetite Eyes: No visual changes, double vision, eye pain Ear, nose and throat: No hearing loss, ear pain, nasal congestion, sore throat Cardiovascular: No chest pain, palpitations Respiratory:  No shortness of breath at rest or with exertion, wheezes GastrointestinaI: No nausea, vomiting, diarrhea, abdominal pain, fecal incontinence Genitourinary:  No dysuria, urinary retention or frequency Musculoskeletal:  No neck pain, back pain Integumentary: No rash, pruritus, skin lesions Neurological: as above Psychiatric: No depression, insomnia, anxiety Endocrine: No palpitations, fatigue, diaphoresis, mood swings, change in appetite, change in weight, increased thirst Hematologic/Lymphatic:  No anemia, purpura, petechiae. Allergic/Immunologic: no itchy/runny eyes, nasal congestion, recent allergic reactions, rashes  PHYSICAL EXAM: Filed Vitals:   11/18/13 1022  BP: 140/82  Pulse: 74  Temp: 98.8 F (37.1 C)  Resp: 18   General: No acute distress Head:  Normocephalic/atraumatic Neck: supple, no paraspinal tenderness, full range of motion Heart:  Regular rate and rhythm Lungs:  Clear to auscultation bilaterally Back: No paraspinal tenderness Neurological Exam: alert and oriented to person, place, and time. Attention span and concentration intact, recent and remote memory intact, fund of knowledge intact.  Speech fluent and not dysarthric, language intact.  CN II-XII intact. Fundoscopic exam unremarkable without vessel changes, exudates, hemorrhages or papilledema.  Bulk and tone normal, muscle strength 5/5 throughout.  Sensation to light touch, temperature and vibration intact.  Deep tendon reflexes 2+ throughout, toes downgoing.  Finger to nose and heel to shin testing intact.  Gait normal, Romberg negative.  IMPRESSION: Mild executive dysfunction, likely related to stroke Depression and  anxiety Right hemispheric ischemic infarct secondary to right carotid artery stenosis. Cerebrovascular disease Carotid artery disease  The majority of her cognitive deficits are likely related to depression and anxiety.  However, she does exhibit mild executive dysfunction, which is probably related to her stroke.  There is no evidence to suggest dementia.  PLAN: Continue ASA 81mg  and Plavix Continue statin therapy (LDL goal should be less than 70) Optimize blood pressure (should be less than 462 systolic or pre-operative pressures) and diabetes control Continue goal for smoking cessation Exercise as per vascular surgery recommendations and Mediterranean diet. Recommend seeing a therapist for counseling, in addition to pharmacologic treatment. Follow up in one year.  Consider repeat neuropsychological testing in 12 to 18 months.  30 minutes spent with patient and mother, over 50% spent discussing results of testing and coordinating plan  Metta Clines, DO  CC:  Clayborn Bigness, MD

## 2013-11-18 NOTE — Patient Instructions (Addendum)
There seems to be some very mild cognitive problems, as far as processing information is concerned, which is related to the stroke.  However, the memory problems and other cognitive issues are primarily due to depression and anxiety, which is actually reassuring (not something like Alzheimer's, which can get worse). 1.  Consider seeing a therapist for counseling 2.  Continue aspirin and Plavix, cholesterol medication 3.  Continue in trying to stop smoking 4.  Consider repeat neuropsychological testing in one to one and a half years with Dr. Conley Canal 5.  Call with questions or concerns 6. Follow up in 1 year.

## 2014-01-05 DIAGNOSIS — Z23 Encounter for immunization: Secondary | ICD-10-CM | POA: Diagnosis not present

## 2014-01-11 ENCOUNTER — Other Ambulatory Visit (HOSPITAL_COMMUNITY): Payer: 59

## 2014-01-11 ENCOUNTER — Ambulatory Visit: Payer: 59 | Admitting: Family

## 2014-01-26 ENCOUNTER — Encounter (HOSPITAL_COMMUNITY): Payer: Self-pay | Admitting: Vascular Surgery

## 2014-01-30 ENCOUNTER — Ambulatory Visit: Payer: Medicare Other | Admitting: Podiatry

## 2014-02-03 DIAGNOSIS — I10 Essential (primary) hypertension: Secondary | ICD-10-CM | POA: Diagnosis not present

## 2014-02-03 DIAGNOSIS — M545 Low back pain: Secondary | ICD-10-CM | POA: Diagnosis not present

## 2014-02-03 DIAGNOSIS — F331 Major depressive disorder, recurrent, moderate: Secondary | ICD-10-CM | POA: Diagnosis not present

## 2014-02-03 DIAGNOSIS — M792 Neuralgia and neuritis, unspecified: Secondary | ICD-10-CM | POA: Diagnosis not present

## 2014-02-03 DIAGNOSIS — F17200 Nicotine dependence, unspecified, uncomplicated: Secondary | ICD-10-CM | POA: Diagnosis not present

## 2014-02-03 DIAGNOSIS — I679 Cerebrovascular disease, unspecified: Secondary | ICD-10-CM | POA: Diagnosis not present

## 2014-02-03 DIAGNOSIS — I69320 Aphasia following cerebral infarction: Secondary | ICD-10-CM | POA: Diagnosis not present

## 2014-02-03 DIAGNOSIS — E1165 Type 2 diabetes mellitus with hyperglycemia: Secondary | ICD-10-CM | POA: Diagnosis not present

## 2014-02-06 ENCOUNTER — Ambulatory Visit: Payer: 59 | Admitting: Podiatry

## 2014-03-17 ENCOUNTER — Encounter: Payer: Self-pay | Admitting: Neurology

## 2014-03-30 DIAGNOSIS — F509 Eating disorder, unspecified: Secondary | ICD-10-CM | POA: Diagnosis not present

## 2014-05-09 DIAGNOSIS — F411 Generalized anxiety disorder: Secondary | ICD-10-CM | POA: Diagnosis not present

## 2014-05-09 DIAGNOSIS — I679 Cerebrovascular disease, unspecified: Secondary | ICD-10-CM | POA: Diagnosis not present

## 2014-05-09 DIAGNOSIS — F331 Major depressive disorder, recurrent, moderate: Secondary | ICD-10-CM | POA: Diagnosis not present

## 2014-05-09 DIAGNOSIS — I10 Essential (primary) hypertension: Secondary | ICD-10-CM | POA: Diagnosis not present

## 2014-05-09 DIAGNOSIS — I69320 Aphasia following cerebral infarction: Secondary | ICD-10-CM | POA: Diagnosis not present

## 2014-05-09 DIAGNOSIS — E1140 Type 2 diabetes mellitus with diabetic neuropathy, unspecified: Secondary | ICD-10-CM | POA: Diagnosis not present

## 2014-05-09 DIAGNOSIS — F17210 Nicotine dependence, cigarettes, uncomplicated: Secondary | ICD-10-CM | POA: Diagnosis not present

## 2014-05-09 DIAGNOSIS — E1165 Type 2 diabetes mellitus with hyperglycemia: Secondary | ICD-10-CM | POA: Diagnosis not present

## 2014-05-17 DIAGNOSIS — F331 Major depressive disorder, recurrent, moderate: Secondary | ICD-10-CM | POA: Diagnosis not present

## 2014-05-17 DIAGNOSIS — F411 Generalized anxiety disorder: Secondary | ICD-10-CM | POA: Diagnosis not present

## 2014-05-29 DIAGNOSIS — Z1231 Encounter for screening mammogram for malignant neoplasm of breast: Secondary | ICD-10-CM | POA: Diagnosis not present

## 2014-06-06 NOTE — Discharge Summary (Signed)
PATIENT NAME:  Morgan Daniels, SILBERNAGEL MR#:  [id number] DATE OF BIRTH:  May 11, 1960  DATE OF ADMISSION:  01/31/2012 DATE OF DISCHARGE:  02/02/2012  ADMITTING PHYSICIAN: Anthonette Legato, MD  DISCHARGING PHYSICIAN: Gladstone Lighter, MD  PRIMARY CARE PHYSICIAN: Clayborn Bigness, MD  CONSULTANTS: None.  DISCHARGE DIAGNOSES: 1. Acute cerebrovascular accident with right parietal infarct.  2. Back surgery x 2 in the past with residual right leg minimal weakness because of that rather than from the stroke.  3. Diabetes mellitus.  4. Hyperlipidemia with hypertriglyceridemia.  5. Diabetes mellitus with HB1c of 8.3.  6. Gastroesophageal reflux disease and gastritis.  7. Anxiety.   DISCHARGE MEDICATIONS: 1. Amitriptyline 1 tablet p.o. daily.  2. Bisoprolol 2.5 mg p.o. daily. 3. Gabapentin 600 mg p.o. twice a day. 4. Prilosec 40 mg p.o. daily.  5. Vitamin D3 5000 international units p.o. daily.  6. Zyrtec 10 mg p.o. daily.  7. Invokana 100 mg q. daily for diabetes.  8. HCTZ 12.5 mg p.o. q. daily for blood pressure and edema.  9. Fluoxetine 40 mg p.o. daily for depression.  10. Alprazolam 0.5 mg twice a day p.r.n. for anxiety.  11. Tramadol 50 mg 1 to 2 tablets 3 times a day as needed for pain.  12. Glimepiride 4 mg once a day with largest meal of the day for diabetes.  13. Zofran 8 mg twice a day p.r.n. for nausea.  14. Humalog sliding scale as directed.  15. Levemir 15 units subcutaneous at bedtime.  16. Aggrenox 1 capsule p.o. twice a day.  17. Simvastatin 40 mg p.o. daily.   DISCHARGE DIET: Low sodium, low fat, ADA diet.   DISCHARGE ACTIVITY: As tolerated.  FOLLOWUP INSTRUCTIONS: 1. Neurology followup in one week.  2. PCP followup in 1 to 2 weeks.  3. Smoking cessation.  LABS AND IMAGING STUDIES: WBC 9.5, hemoglobin 16.3, hematocrit 47.2 and platelet count 303.  Sodium 138, potassium 4.1, chloride 102, bicarbonate 28, BUN 18, creatinine 0.42, glucose 133 and calcium 9.3. INR 0.8. HB1c 8.3.  LDL was difficult to be calculated because of serum triglycerides of 617. Cholesterol 351 and total HDL is 35. Troponins remain negative.  MRI of the brain is showing some changes with chronic small vessel ischemic disease with acute to subacute multifocal areas of ischemic changes and right parietal, cortical and subcortical regions, and periventricular white matter.   Ultrasound Dopplers, of carotids bilaterally, is showing atherosclerotic disease up to 58% in left internal carotid artery. No hemodynamically significant stenosis otherwise.   Urinalysis is negative for any infection.   Chest x-ray is showing no acute cardiopulmonary disease and CT of the head is showing no acute intracranial event.  Echo Doppler is revealing no source of TIA or CVA. LV systolic function greater than 55% ejection fraction. Normal LV systolic function and impaired LV relaxation is present.   BRIEF HOSPITAL COURSE: [name] is a 54 year old female with past medical history significant for back surgery with residual right leg sensory changes and weakness, NSAID-induced gastritis (on Prilosec), hypertension, hyperlipidemia and smoking who comes to the hospital secondary to slurred speech, right arm numbness and also worsening of the right leg weakness.  1. Acute cerebrovascular accident and MRI of the brain revealing right-sided changes, but she does not have left-sided deficits on exam. It was more of right leg weakness and sensory changes which I think is probably worsening from her back surgery. Either way she cannot take aspirin due to her gastritis and does not want to be  on Plavix because of the interactions with PPI as she has to be on the PPI for now. So she was started on Aggrenox which has only low dose aspirin and she will follow up with neurology as an outpatient. Dopplers did not reveal any significant carotid stenosis and echo did not show any source of CVA. She is also on statin and dietary control and  physical activity were advised and smoking cessation was strongly advised and she was counseled on admission and also at discharge.  2. Diabetes mellitus. Her HB1c is 8.3. Her home medications of Levemir, Humalog sliding scale, glimepiride and Invokana were continued at this time.  3. GERD with gastritis. She was on Prilosec, which will be continued.  4. Hypertriglyceridemia. Omega-3 fatty acids over-the-counter, also on statin. She was not taking a statin prior to admission.  Her course has been otherwise uneventful in the hospital. Physical therapy recommended outpatient services.   DISCHARGE CONDITION: Stable.   DISCHARGE DISPOSITION: Home.   TIME SPENT ON DISCHARGE: 40 minutes.  ____________________________ Gladstone Lighter, MD [id number] D: 02/03/2012 14:32:27 ET T: 02/04/2012 08:32:33 ET JOB#: [id number]  cc: Gladstone Lighter, MD, <Dictator> Doneta Public. [name], MD Lavera Guise, MD Gladstone Lighter MD ELECTRONICALLY SIGNED 02/04/2012 15:31

## 2014-06-06 NOTE — H&P (Signed)
PATIENT NAME:  Morgan Daniels, Morgan Daniels MR#:  [id number] DATE OF BIRTH:  1960/05/10  DATE OF ADMISSION:  01/31/2012  REFERRING PHYSICIAN: Lavonia Drafts, MD  PRIMARY CARE PHYSICIAN: Clayborn Bigness, MD   CHIEF COMPLAINT: Slurring of speech, right arm numbness, right leg weakness.   HISTORY OF PRESENT ILLNESS: The patient is a pleasant 54 year old Caucasian female with past medical history of hypertension, diabetes mellitus with episode of DKA in August 2012, NSAID-induced gastritis with erosive gastropathy, hyperlipidemia with hypertriglyceridemia, tobacco abuse, molluscum contagiosum, anxiety, seasonal allergies, and vitamin D deficiency who presented to [facility] with the above complaint. She states that her symptoms began somewhere between 11 a.m. to 11: 45 a.m. She states that she was taking a nap and when she woke she was having a headache with slurring of speech, right upper extremity numbness and right lower extremity weakness. These symptoms were confirmed by her husband who stated that she was definitely having slurring of speech. The slurring of speech has improved. Her right lower extremity weakness has also improved. She still has some tingling in her right upper extremity. CT scan of the head was performed and no acute cerebrovascular accident was noted. The patient did take aspirin 81 mg prior to coming here. However, the patient has history of erosive gastritis and was advised to avoid NSAIDs previously. She currently denies visual disturbance, chest pain, or difficulties swallowing. Her speech appeared to be normal during interview today.   PAST MEDICAL HISTORY:  1. Hypertension.  2. Diabetes mellitus with history of DKA in August 2012.  3. NSAID induced gastritis with erosive gastropathy.  4. Hyperlipidemia including hypertriglyceridemia.  5. Tobacco abuse, which is active.  6. History of molluscum contagiosum.  7. Anxiety.  8. Seasonal allergies.    PAST SURGICAL  HISTORY:  1. Total hysterectomy.  2. Cholecystectomy.  3. Colonoscopy.  4. EGD in August of 2012.   ALLERGIES: Morphine and latex; morphine causes itching, latex causes rash.   MEDICATIONS:  1. Amitriptyline 100 mg p.o. at bedtime.  2. Bisoprolol 2.5 mg p.o. daily.  3. Gabapentin 600 mg p.o. twice a day. 4. Omeprazole 40 mg p.o. daily.  5. Vitamin D3 5000 international units p.o. daily. 6. Invokana 100 mg p.o. daily.  7. Hydrochlorothiazide 12.5 mg p.o. daily.  8. Amphetamine salts 10 mg p.o. twice a day. 9. Fluoxetine 40 mg p.o. daily.  10. Alprazolam 0.5 mg p.o. twice a day. 11. Tramadol 50 mg 1 to 2 tablets p.o. three times daily p.r.n. for pain.  12. Glimepiride 4 mg p.o. daily with largest meal of the day.  13. Zofran 8 mg p.o. twice a day p.r.n. nausea.  14. Humalog sliding scale.  15. Levemir 50 units subcutaneous at bedtime.   SOCIAL HISTORY: The patient lives in [place]. She is married. She is unemployed. She has active tobacco abuse and continues to smoke 1 pack per day. She drinks alcohol occasionally. Denies illicit drug use.   FAMILY HISTORY: Mother has history of hypertension and emphysema. Father is alive and has history of melanoma. He also has history of coronary artery disease.   REVIEW OF SYSTEMS: CONSTITUTIONAL: Denies fevers, chills, and weight loss. EYES: Currently denies blurry vision or diplopia. Wears glasses however. HEENT: Reports right-sided headache. Denies hearing loss, epistaxis, or sore throat. CARDIOVASCULAR: Denies chest pain, palpitations, or PND. RESPIRATORY: Denies cough, shortness of breath, or hemoptysis at present. GASTROINTESTINAL: Denies vomiting or diarrhea. Does have intermittent nausea. GENITOURINARY: Denies frequency, urgency, or dysuria. MUSCULOSKELETAL: Denies joint  pain, swelling, or redness. NEUROLOGIC: As per the history of present illness having right-sided headache, had slurring of speech which resolved, and had right upper  extremity numbness as well as right lower extremity weakness. PSYCHIATRIC: Has anxiety. ENDOCRINE: Has history of diabetes mellitus. HEMATOLOGIC/LYMPHATIC: Denies easy bruisability, bleeding, or swollen lymph nodes. ALLERGY/IMMUNOLOGIC: Denies seasonal allergies or history of immunodeficiency.   PHYSICAL EXAMINATION:   VITAL SIGNS: Temperature is 98.4, pulse 74, and respirations 18. Presenting blood pressure was 150/86. Current blood pressure is 145/89. Pulse oximetry is 97% on oxygen.   GENERAL: A well-developed, well-nourished Caucasian female who appears her stated age, currently in no acute distress.   HEENT: Normocephalic, atraumatic. Extraocular movements are intact. Pupils equal, round, and reactive to light. No scleral icterus. Conjunctivae are pink. No epistaxis noted. Gross hearing intact. Oral mucosa moist.   NECK: Supple without JVD, lymphadenopathy or bruits.   LUNGS: Clear to auscultation bilaterally with normal respiratory effort.   HEART: S1 and S2 regular rate and rhythm. No obvious murmurs or rubs appreciated.   ABDOMEN: Soft, nontender, and nondistended. Bowel sounds positive. No rebound, guarding or organomegaly appreciated.   EXTREMITIES: No clubbing, cyanosis, or edema.   NEUROLOGIC: The patient is alert and oriented to time, person, and place. Strength was measured as being five out of five in both upper and lower extremities. The patient is right-handed. No obvious cranial nerve deficits between 2 through 12 noted at this time. Sensation appeared to be intact in all four extremities.   MUSCULOSKELETAL: No joint redness, swelling or tenderness appreciated.   SKIN: Warm and dry. No rashes noted.   PSYCHIATRIC: The patient is with an appropriate affect and appears to have good insight into her current illness. No slurring of speech noted at this time.   LABS/STUDIES: CT of the head shows no acute intracranial abnormality.   INR 0.9. PTT 27.2. CBC shows WBC 10.3,  hemoglobin 15.6, hematocrit 47, and platelets 296. Complete metabolic panel shows sodium 136, potassium 4, chloride 102, CO2 26, BUN 19, creatinine 0.5, glucose 143, and total protein 8.4. Troponin less than 0.02.   EKG shows normal sinus rhythm. EKG was found to be normal.   ASSESSMENT AND PLAN:  This is a 54 year old Caucasian female with past medical history of hypertension, diabetes mellitus, prior episode of DKA, NSAID-induced gastritis with history of erosive gastropathy, hyperlipidemia, tobacco abuse, molluscum contagiosum, anxiety, and seasonal allergies who presented to [facility] with right-sided headache, right upper extremity numbness, right lower extremity weakness, and slurring of speech which have all mostly resolved. Symptoms concerning for transient ischemic attack. 1. Transient ischemic attack. The patient's symptoms began somewhere between 11 to 11:30 a.m. this morning. She had right-sided headache with right upper extremity numbness, right lower extremity weakness, and slurring of speech. These symptoms have mainly resolved with the exception of mild ongoing right upper extremity weakness. The patient did take aspirin at home. The patient has extensive history of erosive gastropathy and she was advised to avoid NSAIDs. Therefore, we will opt for Plavix 75 mg p.o. daily for treatment. We will change her omeprazole to Zantac to avoid drug interaction. We will also obtain MRI of the brain without contrast, 2-D echocardiogram, and carotid duplex for further evaluation. We will also monitor the patient on telemetry. We will consider neurology consultation based on the results of testing above.  2. Hypertension. Blood pressure is currently acceptable. We will withhold amphetamine salts at present. Continue hydrochlorothiazide and bisoprolol at their current doses.  3. Diabetes mellitus. We will continue home doses of Humalog sliding scale, Levemir, glimepiride, and  Invokana.  We will check Hemoglobin A1c.  4. Anxiety. We will continue the patient on Xanax and fluoxetine at their home doses.  5. Tobacco abuse. We counseled the patient significantly on cessation. We will prescribe the patient a nicotine patch.  6. CODE STATUS. The patient requests FULL CODE status.     7. Healthcare power of attorney. The patient elects her husband as her health care power of attorney should she not be able to make decisions for herself.   TIME SPENT: One hour.  ____________________________ Tama High, MD [id number] D: 01/31/2012 15:43:17 ET T: 01/31/2012 15:59:16 ET JOB#: [id number]  cc: Tama High, MD, <Dictator> Mariah Milling Kshawn Canal MD ELECTRONICALLY SIGNED 01/31/2012 20:42

## 2014-06-10 NOTE — Discharge Summary (Signed)
Dates of Admission and Diagnosis:  Date of Admission 29-Jul-2013   Date of Discharge 30-Jul-2013   Admitting Diagnosis Left sided weakness   Final Diagnosis 1. Right MCA CVA 2. Right carotid stenosis 3. DM 4. Hyperlipidimia - Hypertriglyceridemia    Chief Complaint/History of Present Illness CHIEF COMPLAINT: Left-sided weakness.   HISTORY OF PRESENT ILLNESS: This is a very nice 54 year old female with history of diabetes, hypertension, hyperlipidemia, ongoing tobacco abuse, peripheral vascular disease and a previous CVA in 2013. The patient has history of being her normal self last night. By dinner at 6:00 p.m., she had a sudden onset of index pain and tingling. Then she felt a little bit clammy.  She started having some tingling at the level of the left upper extremity, but not significant, for which she went to bed. She actually went to bed pretty early for her, at 9:30, and she slept until 6:00 a.m., which is rare for her. Whenever she tried to get out of the bed, her left leg gave out, and she could not stand up. The patient started to have also significant left arm weakness. She denies any slurred speech, any significant drooling. No seizures. No headaches. No neck pain or problems. The patient does not have any chest pain, nausea, vomiting or diaphoresis. Overall, when the patient was evaluated, she said that the right leg would not work, as documented, but the patient now tells me it is the left leg that would not work and has weakness in the left arm with a really dry mouth. The patient took 4 baby aspirins today before coming over here, and she felt really lethargic. She states that it feels very similar to her previous stroke.   Allergies:  Morphine: Itching  Latex: Other  LabObservation:  13-Jun-15 11:36   OBSERVATION Reason for Test  Cardiology:  13-Jun-15 11:36   Echo Doppler REASON FOR EXAM:     COMMENTS:     PROCEDURE: [facility] - ECHO DOPPLER COMPLETE(TRANSTHOR)  - Jul 30 2013  11:36AM   RESULT: Echocardiogram Report  Patient Name:   Morgan Daniels Date of Exam: 07/30/2013 Medical Rec #:  [id number]             Custom1: Date of Birth:  [birth year]          Height:       67.0 in Patient Age:    54 years           Weight:       202.0 lb Patient Gender: F                  BSA:          2.03 m??  Indications: CVA Sonographer:    LTM Referring Phys: James Ivanoff, ROBERTO  Summary:  1. Left ventricular ejection fraction, by visual estimation, is 55 to  60%.  2. Normal global left ventricular systolic function.  3. Mild concentric left ventricular hypertrophy.  4. Grade 1 diastolic dysfunction.  5. Nocardiac source of embolism is identified. 2D AND M-MODE MEASUREMENTS (normal ranges within parentheses): Left Ventricle:          Normal   AoV Cusp Separation: 1.90 cm (1.5-2.6) IVSd (2D):      1.20 cm (0.7-1.1) LVPWd (2D):     1.18 cm (0.7-1.1) Aorta/LA:                  Normal LVIDd (2D):     4.53 cm (3.4-5.7) Aortic Root (2D): 3.50 cm (  2.4-3.7) LVIDs (2D):     2.64 cm           AoV Cusp Exc:     1.90 cm (1.5-2.6) LV FS (2D):     41.7 %   (>25%)   Left Atrium (2D): 4.30 cm (1.9-4.0) LV EF (2D):     72.8 %   (>50%)                                    Right Ventricle:                                   RVd (2D): LV DIASTOLIC FUNCTION: MV Peak E: 0.65 m/s MV Peak A: 1.03 m/s E/A Ratio: 0.63 SPECTRAL DOPPLER ANALYSIS (where applicable): Aortic Valve: AoV Max Vel: 1.63 m/s AoV Peak PG: 10.6 mmHg AoV Mean PG: LVOT Vmax: 1.11 m/s LVOT VTI:  LVOT Diameter: 1.90 cm AoV Area, Vmax: 1.93 cm?? AoV Area, VTI:  AoV Area, Vmn: Tricuspid Valve and PA/RV Systolic Pressure: TR Max Velocity: 2.40 m/s RA  Pressure: 10 mmHg RVSP/PASP: 33.0 mmHg Pulmonic Valve: PV Max Velocity: 0.99 m/s PV Max PG: 3.9 mmHg PV Mean PG:  PHYSICIAN INTERPRETATION: Left Ventricle: The left ventricular internal cavity size was normal.  Mild concentric left ventricular hypertrophy. Global LV  systolic function  was normal. Left ventricular ejection fraction, by visual estimation, is  55 to 60%. Right Ventricle: Normal right ventricular size, wall thickness, and  systolic function. Left Atrium: The left atrium is normal in size and structure. Right Atrium: The right atrium is normal in size and structure. Pericardium: There is no evidence of pericardial effusion. Mitral Valve: The mitral valve is normal in structure. Trace mitral valve  regurgitation is seen. Tricuspid Valve: The tricuspid valve is normal. Trivial tricuspid  regurgitation is visualized. The tricuspid regurgitant velocity is 2.40  m/s, and with an assumed right atrial pressure of 10 mmHg, the estimated  right ventricular systolic pressure is normal at 33.0 mmHg. Aortic Valve: The aortic valve is trileaflet and structurally normal,  with normal leaflet excursion; without any evidence of aortic stenosis or  insufficiency. Pulmonic Valve: The pulmonic valve isnormal. No indication of pulmonic  valve regurgitation. Aorta: The aortic root is normal in size and structure. Venous: The inferior vena cava was normal.  [id number] Kathlyn Sacramento MD Electronically signed by [id number] Kathlyn Sacramento MD Signature Date/Time: 07/31/2013/12:13:49 PM *** Final ***  IMPRESSION: .    Verified By: Mertie Clause. Fletcher Anon, M.D., MD  Routine Chem:  13-Jun-15 04:36   Cholesterol, Serum  211  Triglycerides, Serum  607  HDL (INHOUSE)  24  VLDL Cholesterol Calculated SEE COMMENT  LDL Cholesterol Calculated SEE COMMENT  Result Comment VLDL/LDL - Unable to report VLDL and LDL due to a  - Triglyceride value that is 400 mg/dL or   - greater.  Result(s) reported on 30 Jul 2013 at [facility].  Glucose, Serum  109  BUN 15  Creatinine (comp) 0.67  Sodium, Serum 138  Potassium, Serum 4.0  Chloride, Serum 103  CO2, Serum 26  Calcium (Total), Serum 8.8  Anion Gap 9  Osmolality (calc) 277  eGFR (African American) >60  eGFR (Non-African  American) >60 (eGFR values <74m/min/1.73 m2 may be an indication of chronic kidney disease (CKD). Calculated eGFR is useful in patients with stable renal function. The eGFR  calculation will not be reliable in acutely ill patients when serum creatinine is changing rapidly. It is not useful in  patients on dialysis. The eGFR calculation may not be applicable to patients at the low and high extremes of body sizes, pregnant women, and vegetarians.)  Magnesium, Serum 1.9 (1.8-2.4 THERAPEUTIC RANGE: 4-7 mg/dL TOXIC: > 10 mg/dL  -----------------------)  Routine Hem:  13-Jun-15 04:36   WBC (CBC) 10.5  RBC (CBC) 4.71  Hemoglobin (CBC) 14.3  Hematocrit (CBC) 42.2  Platelet Count (CBC) 263  MCV 90  MCH 30.4  MCHC 34.0  RDW 13.0  Neutrophil % 60.6  Lymphocyte % 27.4  Monocyte % 6.9  Eosinophil % 4.3  Basophil % 0.8  Neutrophil # 6.4  Lymphocyte # 2.9  Monocyte # 0.7  Eosinophil # 0.4  Basophil # 0.1 (Result(s) reported on 30 Jul 2013 at 06:00AM.)   PERTINENT RADIOLOGY STUDIES: XRay:    12-Jun-15 07:24, Chest Portable Single View  Chest Portable Single View   REASON FOR EXAM:    CVA  COMMENTS:       PROCEDURE: DXR - DXR PORTABLE CHEST SINGLE VIEW  - Jul 29 2013  7:24AM     CLINICAL DATA:  Numbness in left arm    EXAM:  PORTABLE CHEST - 1 VIEW    COMPARISON:  01/31/2012    FINDINGS:  The cardiac shadow is stable. Minimal platelike atelectatic changes  are noted in the mid lungs bilaterally. No focal confluent  infiltrate is seen. No sizable effusion is noted.     IMPRESSION:  Minimal bilateral platelike atelectasis.      Electronically Signed    By: Inez Catalina M.D.    On: 07/29/2013 07:35         Verified By: Everlene Farrier, M.D.,  MRI:    12-Jun-15 16:13, MRI Brain Without Contrast  MRI Brain Without Contrast   REASON FOR EXAM:    CVA  COMMENTS:       PROCEDURE: MR  - MR BRAIN WO CONTRAST  - Jul 29 2013  4:13PM     CLINICAL DATA:  Left arm weakness and  inability to use the left leg.  Nausea. Symptoms began this morning. Evaluate for stroke.    EXAM:  MRI HEAD WITHOUT CONTRAST    TECHNIQUE:  Multiplanar, multiecho pulse sequences of the brain and surrounding  structures were obtained without intravenous contrast.  COMPARISON:  Head CT 07/29/2013 and MRI 01/31/2012    FINDINGS:  There are multiple punctate foci of cortical and subcortical  restricted diffusion in the right parietal lobe and posterior right  frontal lobe. A few slightly larger foci of restricted diffusion are  present in the posterior right centrum semiovale and corona radiata  measuring up to 9 mm in size. There is no evidence of intracranial  hemorrhage. Foci of T2 hyperintensity in the subcortical and deep  cerebral white matter bilaterally are similar to the prior MRI and  compatible with mild chronic small vessel ischemic disease. There is  no mass, midline shift, or extra-axial fluid collection. There is  mild generalized cerebral atrophy.    Orbits are unremarkable. Trace right maxillary sinus mucosal  thickening is noted. Mastoid air cells are clear. Major intracranial  vascular flow voids are preserved.     IMPRESSION:  1. Small, acute embolic infarcts in the right frontal and parietal  lobes.  2. Mild chronic small vessel ischemic disease.      Electronically Signed    By: Zenia Resides  Jeralyn Ruths    On: 07/29/2013 16:25         Verified By: Ferol Luz, M.D.,  CT:    12-Jun-15 07:22, CT Head Without Contrast  CT Head Without Contrast   REASON FOR EXAM:    LEFT ARM AND LEG WEAKNESS  COMMENTS:       PROCEDURE: CT  - CT HEAD WITHOUT CONTRAST  - Jul 29 2013  7:22AM     CLINICAL DATA:  Left subtle weakness    EXAM:  CT HEAD WITHOUT CONTRAST    TECHNIQUE:  Contiguous axial images were obtained from the base of the skull  through the vertex without intravenous contrast.    COMPARISON:  01/31/2012  FINDINGS:  The bony calvarium is intact. No gross  soft tissue abnormality is  noted. No acute hemorrhage, acute infarction or space-occupying mass  lesion is identified. No soft tissue abnormality is noted.     IMPRESSION:  No acute abnormality seen.      Electronically Signed    By: Inez Catalina M.D.    On: 07/29/2013 07:25         Verified By: Everlene Farrier, M.D.,    12-Jun-15 10:31, CT Angiography Neck (Carotids)  CT Angiography Neck (Carotids)   REASON FOR EXAM:    s/p CVA h/o Cariotid stenosis 75%  COMMENTS:       PROCEDURE: CT  - CT ANGIOGRAPHY NECK W/CONTRAST  - Jul 29 2013 10:31AM     CLINICAL DATA:  Left leg weakness.  Prior left carotid surgery.    EXAM:  CT ANGIOGRAPHY NECK    TECHNIQUE:  Multidetector CT imaging of the neck was performed using the  standard protocol during bolus administration of intravenous  contrast. Multiplanar CT image reconstructions and MIPs were  obtained to evaluate the vascular anatomy. Carotid stenosis  measurements (when applicable) are obtained utilizing NASCET  criteria, using the distal internal carotid diameter as the  denominator.    CONTRAST:  80 mL Isovue 370 IV    COMPARISON:  Carotid Doppler 01/31/2012    FINDINGS:  Negative for fracture or mass in the neck.  Lung apices are clear.    Proximal great vessels are widely patent. Standard branching of the  aortic arch.    Right carotid: Right common carotid artery is widely patent.  Critical stenosis involving the proximal left internal carotid  artery. There is a 15 mm segment of severe stenosis with the lumen  narrowed to 1.1 mm, corresponding to 80% diameter stenosis. This is  mostly soft plaque with a small amount of calcification. There is  calcified plaque of the right internal carotid artery below the  skullbase not causing significant stenosis of the lumen.    Left carotid: Left common carotid artery widely patent. Prior  carotid endarterectomy. Left internal carotid artery is widely  patent. There is some  kinking and tortuosity of the origin of the  external carotid artery which could be causing some degree of  stenosis. This may be due to prior surgery.    Vertebral arteries: Both vertebral arteries are patent to the  basilar without stenosis.  Review of the MIP images confirms the above findings.     IMPRESSION:  80% diameter stenosis proximal right internal carotid artery    Prior carotid endarterectomy on the left. Left internal carotid  artery widely patent. Kinking and narrowing of the origin of the  left external carotid artery.      Electronically Signed  By: Franchot Gallo M.D.    On: 07/29/2013 10:52       Verified By: Truett Perna, M.D.,   Pertinent Past History:  Pertinent Past History PAST MEDICAL HISTORY:  1. CVA in 2013, right parietal.  2. Peripheral vascular disease.  3. Carotid arteriosclerosis, status post endarterectomy of the left side.  4. Hypertension.  5. Diabetes.  6. Ongoing tobacco abuse.  7. Hyperlipidemia.  8. Hypertriglyceridemia.  9. Molluscum.  10. Anxiety.  11. Seasonal allergies.  12. GERD.   PAST SURGICAL HISTORY:  1. Left endarterectomy.  2. Cholecystectomy.  3. Total hysterectomy.  4. Colonoscopy and EGD.  5. Hernioplasty.  6. Two back surgeries.   Hospital Course:  Hospital Course Right MCA territory infarcts from Right ICA stenosis 80%. On neuro checks. Tele showed no arrythmias Seen by neurology Aggrenoix changed to ASA and Plavix For Hyper TG Lopid was added to Statin Echo normal Needs good control of HTN and DM as OP. needs OP f/u with her vascular surgeon [facility] within a week  Time spent on day of d/c 40 min   Condition on Discharge Fair   Code Status:  Code Status Full Code   PHYSICAL EXAM ON DISCHARGE:  Physical Exam:  GEN no acute distress   RESP normal resp effort   CARD regular rate  no murmur   PSYCH alert, A+O to time, place, person, good insight   Additional  Comments Mild weakness LLE   VITAL SIGNS:  Vital Signs: **Vital Signs.:   13-Jun-15 08:41  Vital Signs Type Routine  Temperature Temperature (F) 97.6  Temperature Source oral  Pulse Pulse 88  Respirations Respirations 18  Systolic BP Systolic BP 932  Diastolic BP (mmHg) Diastolic BP (mmHg) 94  Mean BP 116  Pulse Ox % Pulse Ox % 96  Pulse Ox Activity Level  At rest  Oxygen Delivery 3.5L    11:25  Vital Signs Type Routine  Temperature Temperature (F) 98.2  Celsius 36.7  Temperature Source oral  Pulse Pulse 90  Respirations Respirations 18  Systolic BP Systolic BP 355  Diastolic BP (mmHg) Diastolic BP (mmHg) 83  Mean BP 100  Pulse Ox % Pulse Ox % 91  Pulse Ox Activity Level  At rest  Oxygen Delivery 2L   DISCHARGE INSTRUCTIONS HOME MEDS:  Medication Reconciliation: Patient's Home Medications at Discharge:     Medication Instructions  amitriptyline 100 mg oral tablet  1 tab(s) orally once a day (at bedtime) for depression.   gabapentin 600 mg oral tablet  1 tab(s) orally 2 times a day   fluoxetine 40 mg oral capsule  1 cap(s) orally once a day for depression.   tramadol 50 mg oral tablet  1 to 2 tab(s) orally 3 times a day as needed for pain.    ondansetron 8 mg oral tablet  1 tab(s) orally 2 times a day as needed for nausea.   humalog 100 units/ml subcutaneous solution   sliding scale subcutaneous as directed.   victoza 18 mg/3 ml subcutaneous solution  1.2 milliliter(s) subcutaneous once a day   alprazolam 0.5 mg oral tablet  1 tab(s) orally 3 times a day, As Needed   glimepiride 4 mg oral tablet  1 tab(s) orally 2 times a day   atorvastatin 10 mg oral tablet  1 tab(s) orally once a day (at bedtime)   pantoprazole 40 mg oral delayed release tablet  1 tab(s) orally 2 times a day  dipyridamole 75 mg oral tablet  1 tab(s) orally 3 times a day   aspirin 81 mg oral tablet  1 tab(s) orally once a day   clopidogrel 75 mg oral tablet  1 tab(s) orally once a day   gemfibrozil  600 mg oral tablet  1 tab(s) orally 2 times a day   glucophage 1000 mg oral tablet  1 tab(s) orally 2 times a day    STOP TAKING THE FOLLOWING MEDICATION(S):    bisoprolol 5 mg oral tablet: 0.5 tab (2.9m) orally once a day for blood pressure. hydrochlorothiazide 12.5 mg oral capsule: 1 cap(s) orally once a day for blood pressure/edema. bactrim ds 800 mg-160 mg oral tablet: 1 tab(s) orally 2 times a day  Physician's Instructions:  Diet Low Sodium  Low Fat, Low Cholesterol  Carbohydrate Controlled (ADA) Diet   Activity Limitations As tolerated   Return to Work Not Applicable   Time frame for Follow Up Appointment 1-2 weeks  [facility] within 1 week   Time frame for Follow Up Appointment 1-2 weeks  PCP   Other Comments Quit smoking Do not take BP medications till you see your doctor.   Electronic Signatures: Shaleah Nissley, SLottie Dawson(MD)  (Signed 15-Jun-15 08:45)  Authored: ADMISSION DATE AND DIAGNOSIS, CHIEF COMPLAINT/HPI, Allergies, PERTINENT LABS, PERTINENT RADIOLOGY STUDIES, PERTINENT PAST HISTORY, HOSPITAL COURSE, PHYSICAL EXAM ON DISCHARGE, VITAL SIGNS, DISCHARGE INSTRUCTIONS HOME MEDS, PATIENT INSTRUCTIONS   Last Updated: 15-Jun-15 08:45 by SAlba Destine(MD)

## 2014-06-10 NOTE — Consult Note (Signed)
Referring Physician:  James Ivanoff, Roselie Awkward :   Primary Care Physician:  James Ivanoff, [facility] : [facility], [address], [place], [place] [address], [place] [phone]  Reason for Consult: Admit Date: 29-Jul-2013  Chief Complaint: L sided weakness  Reason for Consult: CVA   History of Present Illness: History of Present Illness:   54 yo RHD F presents with sudden onset of L sided weakness.  Pt woke up with this so was not a tPA candidate.  Pt has had a previous stroke on the opposite side.  Pt denies any vision changes.  Pt denies chest pain or seizure activity.  ROS:  General denies complaints   HEENT no complaints   Lungs no complaints   Cardiac no complaints   GI no complaints   GU no complaints   Musculoskeletal no complaints   Extremities no complaints   Skin no complaints   Neuro no complaints   Endocrine no complaints   Psych no complaints   Past Medical/Surgical Hx:  Transient Ischemic Attack:   diabetes:   Shingles:   HTN:   Past Medical/ Surgical Hx:  Past Medical History reviewed by me as above   Past Surgical History reviewed by me as above   Home Medications: Medication Instructions Last Modified Date/Time  Bactrim DS 800 mg-160 mg oral tablet 1 tab(s) orally 2 times a day 12-Jun-15 08:31  Victoza 18 mg/3 mL subcutaneous solution 1.2 milliliter(s) subcutaneous once a day 12-Jun-15 08:31  alprazolam 0.5 mg oral tablet 1 tab(s) orally 3 times a day, As Needed 12-Jun-15 08:31  glimepiride 4 mg oral tablet 1 tab(s) orally 2 times a day 12-Jun-15 08:31  atorvastatin 10 mg oral tablet 1 tab(s) orally once a day (at bedtime) 12-Jun-15 08:31  pantoprazole 40 mg oral delayed release tablet 1 tab(s) orally 2 times a day 12-Jun-15 08:31  dipyridamole 75 mg oral tablet 1 tab(s) orally 3 times a day 12-Jun-15 08:31  aspirin 81 mg oral tablet 1 tab(s) orally once a day 12-Jun-15 08:31  amitriptyline 100 mg oral tablet 1 tab(s)  orally once a day (at bedtime) for depression. 12-Jun-15 08:31  bisoprolol 5 mg oral tablet 0.5 tab (2.$RemoveBe'5mg'DmRQRtKPb$ ) orally once a day for blood pressure. 12-Jun-15 08:31  gabapentin 600 mg oral tablet 1 tab(s) orally 2 times a day 12-Jun-15 08:31  hydrochlorothiazide 12.5 mg oral capsule 1 cap(s) orally once a day for blood pressure/edema. 12-Jun-15 08:31  fluoxetine 40 mg oral capsule 1 cap(s) orally once a day for depression. 12-Jun-15 08:31  tramadol 50 mg oral tablet 1 to 2 tab(s) orally 3 times a day as needed for pain.  12-Jun-15 08:31  ondansetron 8 mg oral tablet 1 tab(s) orally 2 times a day as needed for nausea. 12-Jun-15 08:31  Humalog 100 units/mL subcutaneous solution  sliding scale subcutaneous as directed. 12-Jun-15 08:31   Allergies:  Morphine: Itching  Latex: Other  Allergies:  Allergies reviewed as above   Social/Family History: Employment Status: unemployed  Lives With: spouse  Living Arrangements: house  Social History: no tob, no EtOH, no illicits  Family History: no stroke, no seizure   Vital Signs: **Vital Signs.:   12-Jun-15 16:33  Vital Signs Type Q 4hr  Temperature Temperature (F) 98  Celsius 36.6  Temperature Source oral  Pulse Pulse 84  Respirations Respirations 18  Systolic BP Systolic BP 076  Diastolic BP (mmHg) Diastolic BP (mmHg) 75  Mean BP 90  Pulse Ox % Pulse Ox % 95  Pulse Ox Activity  Level  At rest  Oxygen Delivery 3.5L   Physical Exam: General: mildly overweight, NAD  HEENT: normocephalic, sclera nonicteric, oropharynx clear  Neck: supple, no JVD, no bruits  Chest: CTA B, no wheezing, good movement  Cardiac: RRR, no murmurs, no edema, 2+ pulses  Extremities: no C/C/E, FROM   Neurologic Exam: Mental Status: alert and oriented x 3, normal speech and language, follows complex commands  Cranial Nerves: PERRLA, EOMI, nl VF, face symmetric, tongue midline, shoulder shrug equal  Motor Exam: 5/5 R, 4/5 L, 2/5 L LE, nl tone  Deep Tendon  Reflexes: 1+/4 B, plantars B  Sensory Exam: pinprick, temperature, and vibration intact B  Coordination: FTN and HTS WNL R, untestable L   Lab Results: Hepatic:  12-Jun-15 06:50   Bilirubin, Total 0.2  Alkaline Phosphatase 83 (45-117 NOTE: New Reference Range 01/07/13)  SGPT (ALT) 38  SGOT (AST) 25  Total Protein, Serum 7.8  Albumin, Serum 3.5  Routine Chem:  12-Jun-15 06:50   Hemoglobin A1c ([facility])  6.9 (The American Diabetes Association recommends that a primary goal of therapy should be <7% and that physicians should reevaluate the treatment regimen in patients with HbA1c values consistently >8%.)  Glucose, Serum  157  BUN 14  Creatinine (comp)  0.58  Sodium, Serum 138  Potassium, Serum 4.0  Chloride, Serum 102  CO2, Serum 27  Calcium (Total), Serum 9.1  Osmolality (calc) 279  eGFR (African American) >60  eGFR (Non-African American) >60 (eGFR values <77mL/min/1.73 m2 may be an indication of chronic kidney disease (CKD). Calculated eGFR is useful in patients with stable renal function. The eGFR calculation will not be reliable in acutely ill patients when serum creatinine is changing rapidly. It is not useful in  patients on dialysis. The eGFR calculation may not be applicable to patients at the low and high extremes of body sizes, pregnant women, and vegetarians.)  Anion Gap 9  Cardiac:  12-Jun-15 06:50   Troponin I < 0.02 (0.00-0.05 0.05 ng/mL or less: NEGATIVE  Repeat testing in 3-6 hrs  if clinically indicated. >0.05 ng/mL: POTENTIAL  MYOCARDIAL INJURY. Repeat  testing in 3-6 hrs if  clinically indicated. NOTE: An increase or decrease  of 30% or more on serial  testing suggests a  clinically important change)  Routine UA:  12-Jun-15 10:56   Color (UA) Yellow  Clarity (UA) Hazy  Glucose (UA) Negative  Bilirubin (UA) Negative  Ketones (UA) Negative  Specific Gravity (UA) 1.049  Blood (UA) Negative  pH (UA) 6.0  Protein (UA) Negative  Nitrite (UA)  Positive  Leukocyte Esterase (UA) 3+ (Result(s) reported on 29 Jul 2013 at 11:20AM.)  RBC (UA) 5 /HPF  WBC (UA) 38 /HPF  Bacteria (UA) 1+  Epithelial Cells (UA) 3 /HPF (Result(s) reported on 29 Jul 2013 at 11:20AM.)  Routine Coag:  12-Jun-15 06:50   Prothrombin 12.8  INR 1.0 (INR reference interval applies to patients on anticoagulant therapy. A single INR therapeutic range for coumarins is not optimal for all indications; however, the suggested range for most indications is 2.0 - 3.0. Exceptions to the INR Reference Range may include: Prosthetic heart valves, acute myocardial infarction, prevention of myocardial infarction, and combinations of aspirin and anticoagulant. The need for a higher or lower target INR must be assessed individually. Reference: The Pharmacology and Management of the Vitamin K  antagonists: the seventh ACCP Conference on Antithrombotic and Thrombolytic Therapy. KGURK.2706 Sept:126 (3suppl): [id number]. A HCT value >55% may artifactually increase the PT.  In one  study,  the increase was an average of 25%. Reference:  "Effect on Routine and Special Coagulation Testing Values of Citrate Anticoagulant Adjustment in Patients with High HCT Values." American Journal of Clinical Pathology 2006;126:400-405.)  Activated PTT (APTT) 28.3 (A HCT value >55% may artifactually increase the APTT. In one study, the increase was an average of 19%. Reference: "Effect on Routine and Special Coagulation Testing Values of Citrate Anticoagulant Adjustment in Patients with High HCT Values." American Journal of Clinical Pathology 2006;126:400-405.)  Routine Hem:  12-Jun-15 06:50   WBC (CBC) 7.7  RBC (CBC) 5.04  Hemoglobin (CBC) 15.5  Hematocrit (CBC) 45.3  Platelet Count (CBC) 272 (Result(s) reported on 29 Jul 2013 at 07:55AM.)  MCV 90  MCH 30.7  MCHC 34.2  RDW 13.2   Radiology Results: MRI:    12-Jun-15 16:13, MRI Brain Without Contrast  MRI Brain Without Contrast    REASON FOR EXAM:    CVA  COMMENTS:       PROCEDURE: MR  - MR BRAIN WO CONTRAST  - Jul 29 2013  4:13PM     CLINICAL DATA:  Left arm weakness and inability to use the left leg.  Nausea. Symptoms began this morning. Evaluate for stroke.    EXAM:  MRI HEAD WITHOUT CONTRAST    TECHNIQUE:  Multiplanar, multiecho pulse sequences of the brain and surrounding  structures were obtained without intravenous contrast.  COMPARISON:  Head CT 07/29/2013 and MRI 01/31/2012    FINDINGS:  There are multiple punctate foci of cortical and subcortical  restricted diffusion in the right parietal lobe and posterior right  frontal lobe. A few slightly larger foci of restricted diffusion are  present in the posterior right centrum semiovale and corona radiata  measuring up to 9 mm in size. There is no evidence of intracranial  hemorrhage. Foci of T2 hyperintensity in the subcortical and deep  cerebral white matter bilaterally are similar to the prior MRI and  compatible with mild chronic small vessel ischemic disease. There is  no mass, midline shift, or extra-axial fluid collection. There is  mild generalized cerebral atrophy.    Orbits are unremarkable. Trace right maxillary sinus mucosal  thickening is noted. Mastoid air cells are clear. Major intracranial  vascular flow voids are preserved.     IMPRESSION:  1. Small, acute embolic infarcts in the right frontal and parietal  lobes.  2. Mild chronic small vessel ischemic disease.      Electronically Signed    By: Logan Bores    On: 07/29/2013 16:25         Verified By: Ferol Luz, M.D.,  CT:    12-Jun-15 07:22, CT Head Without Contrast  CT Head Without Contrast   REASON FOR EXAM:    LEFT ARM AND LEG WEAKNESS  COMMENTS:       PROCEDURE: CT  - CT HEAD WITHOUT CONTRAST  - Jul 29 2013  7:22AM     CLINICAL DATA:  Left subtle weakness    EXAM:  CT HEAD WITHOUT CONTRAST    TECHNIQUE:  Contiguous axial images were obtained from the  base of the skull  through the vertex without intravenous contrast.    COMPARISON:  01/31/2012  FINDINGS:  The bony calvarium is intact. No gross soft tissue abnormality is  noted. No acute hemorrhage, acute infarction or space-occupying mass  lesion is identified. No soft tissue abnormality is noted.     IMPRESSION:  No acute abnormality seen.  Electronically Signed    By: Inez Catalina M.D.    On: 07/29/2013 07:25         Verified By: Everlene Farrier, M.D.,   Radiology Impression: Radiology Impression: MRI of brain was reviewed by me and shows R MCA embolic infarct   Impression/Recommendations: Recommendations:   prior notes reviewed by me reviewed by me   R MCA embolic infarct-  symptomatic causing L weakness, probably thromboembolic from L carotid L carotid stenosis-  symptomatic UTI-  asymptomatic d/c dypiramole start plavix 47m daily continue ASA 834mdaily continue statin for LDL < 70 permissive HTN ok until after CEA recommend immediate carotid endartectomy in the next two weeks will sign off,  please have pt f/u with 3 months  Electronic Signatures: SmJamison NeighborMD)  (Signed 12-Jun-15 18:20)  Authored: REFERRING PHYSICIAN, Primary Care Physician, Consult, History of Present Illness, Review of Systems, PAST MEDICAL/SURGICAL HISTORY, HOME MEDICATIONS, ALLERGIES, Social/Family History, NURSING VITAL SIGNS, Physical Exam-, LAB RESULTS, RADIOLOGY RESULTS, Recommendations   Last Updated: 12-Jun-15 18:20 by SmJamison NeighborMD)

## 2014-06-10 NOTE — H&P (Signed)
PATIENT NAME:  Morgan Daniels, Morgan Daniels MR#:  [id number] DATE OF BIRTH:  1960/11/16  DATE OF ADMISSION:  07/29/2013  REFERRING PHYSICIAN: Algis Liming. Jimmye Norman, MD  PRIMARY CARE PHYSICIAN: Lavera Guise, MD   CHIEF COMPLAINT: Left-sided weakness.   HISTORY OF PRESENT ILLNESS: This is a very nice 54 year old female with history of diabetes, hypertension, hyperlipidemia, ongoing tobacco abuse, peripheral vascular disease and a previous CVA in 2013. The patient has history of being her normal self last night. By dinner at 6:00 p.m., she had a sudden onset of index pain and tingling. Then she felt a little bit clammy.  She started having some tingling at the level of the left upper extremity, but not significant, for which she went to bed. She actually went to bed pretty early for her, at 9:30, and she slept until 6:00 a.m., which is rare for her. Whenever she tried to get out of the bed, her left leg gave out, and she could not stand up. The patient started to have also significant left arm weakness. She denies any slurred speech, any significant drooling. No seizures. No headaches. No neck pain or problems. The patient does not have any chest pain, nausea, vomiting or diaphoresis. Overall, when the patient was evaluated, she said that the right leg would not work, as documented, but the patient now tells me it is the left leg that would not work and has weakness in the left arm with a really dry mouth. The patient took 4 baby aspirins today before coming over here, and she felt really lethargic. She states that it feels very similar to her previous stroke. The patient had a blood pressure of 139/85, and her heart rate was 87. Other than that, she does not have any significant signs of speech problems or dysphagia. Overall, the patient had a previous hospitalization here that showed acute stroke. At that moment, was a right parietal infarct. The patient went to get evaluated by vascular as she had carotid artery  stenosis. Her ultrasound Doppler showed arteriosclerotic disease up to 58% in the left internal carotid artery. She has had a carotid endarterectomy after that. On her right side, the patient states it is up to 60% to 70%, as per the last ultrasound done by her vascular specialist. The patient admitted for treatment and evaluation of possible stroke.   REVIEW OF SYSTEMS: A 12-system review of systems is done. CONSTITUTIONAL: Fever is negative. Fatigue is positive today. Weakness positive on the left side.  EYES: No blurry vision, double vision or pain.  ENT: No difficulty swallowing. No tinnitus. No nasal drip.  RESPIRATORY: No cough, wheezing or hemoptysis. The patient is a smoker. She has not been diagnosed with COPD, but she smokes half a pack a day. Smoking cessation counseling given to the patient. The patient states that she is not ready to quit. She does not feel like she can quit ever. I offered my help. For now, we are going to do nicotine patches to help her while she is here in the hospital.  GASTROINTESTINAL: She had no nausea, vomiting, diarrhea. The patient has occasional gas pains that happened since she had her gallbladder removed.  GENITOURINARY: No dysuria, hematuria, changes in frequency.  HEMATOLOGIC AND LYMPHATIC: No anemia, easy bruising or bleeding.  ENDOCRINOLOGY: No polyuria, polydipsia, polyphagia, cold or heat intolerance. Positive dry mouth. The patient has a history of diabetes.  SKIN: No rash or petechiae.  MUSCULOSKELETAL: No neck pain or back pain. No gout.  NEUROLOGIC:  No ataxia. No seizures. No headaches. Positive previous CVA. Positive left side weakness today.  PSYCHIATRIC: No agitation. The patient has history of anxiety.   PAST MEDICAL HISTORY:  1. CVA in 2013, right parietal.  2. Peripheral vascular disease.  3. Carotid arteriosclerosis, status post endarterectomy of the left side.  4. Hypertension.  5. Diabetes.  6. Ongoing tobacco abuse.  7.  Hyperlipidemia.  8. Hypertriglyceridemia.  9. Molluscum.  10. Anxiety.  11. Seasonal allergies.  12. GERD.   PAST SURGICAL HISTORY:  1. Left endarterectomy.  2. Cholecystectomy.  3. Total hysterectomy.  4. Colonoscopy and EGD.  5. Hernioplasty.  6. Two back surgeries.   ALLERGIES: MORPHINE AND LATEX.   SOCIAL HISTORY: The patient lives in [place]. She is married. She does not use any drugs that are non-prescribed other than tobacco. The patient smokes half pack a day. She has been smoking for over 40 years. She cut back since last year. She used to smoke up to 1-1/2 packs a day. She drinks very seldom. She does not use any other drugs.   FAMILY HISTORY: Positive for hypertension and emphysema in mother. Father had melanoma. No history of CVA. Positive coronary artery disease in father.   CURRENT MEDICATIONS: Include:  1. Aspirin 81 mg daily. 2. Dipyridamole 75 mg 3 times daily. 3. Victoza 1.2 mL daily.  4. Tramadol 50 mg as needed for pain.  5. Protonix 40 mg twice daily. 6. Zofran as needed for nausea.  7. Hydrochlorothiazide 12.5 mg once daily. 8. Humalog sliding scale.  9. Glipizide 4 mg twice daily.  10. Gabapentin 600 mg twice daily.  11. Fluoxetine 40 mg once daily. 12. Bisoprolol 5 mg take 1/2 tablet once a day.  13. Bactrim DS recently started for infection on right breast.  14. Atorvastatin 10 mg daily.  15. Amitriptyline 100 mg at bedtime. 16. Alprazolam 0.5 mg 3 times a day as needed for anxiety.   PHYSICAL EXAMINATION:  VITAL SIGNS: Blood pressure of 139/85, pulse 87, respirations 20, oximetry 92% on room air. GENERAL: The patient is slightly lethargic but easy to awaken. She remains awake during the whole interview. She just looks tired.  HEENT: Her pupils are equal, round and reactive. Extraocular movements are intact. Mucosae are dry. Anicteric sclerae. Pink conjunctivae. No oral lesions. No oropharyngeal exudates.  NECK: Supple. No JVD. No thyromegaly. No  adenopathy. No carotid bruits at this moment. The scar from left endarterectomy seen, and it is healed and old. No rigidity.  CARDIOVASCULAR: Regular rate and rhythm. No murmurs, rubs or gallops are appreciated. No displacement of PMI. No tenderness to palpation of anterior chest wall.  LUNGS: Clear, without any wheezing or crepitus. No use of accessory muscles.  ABDOMEN: Soft, nontender, nondistended. No hepatosplenomegaly. No masses. Bowel sounds are positive. No rebound tenderness.  GENITAL: Deferred.  EXTREMITIES: No edema, cyanosis or clubbing. Pulses +2. Capillary refill less than 3.  NEUROLOGIC: Cranial nerves II through XII overall intact. There is a slight deviation to the left of the tongue, but I am not sure if this is from previous stroke. There is no facial droop evident at this moment. The patient able to open and close her eyes. Uvula is central. Symmetric face. Strength is significantly decreased on the left side. Positive pronator drift. The patient not able to lift her left up more than 1 second up to an inch out of the bed. Sensation seems to be maintained in all 4 extremities. Her strength on the left side  is 3 out of 5. DTRs brisk on the left lower extremity, +1 on right upper, left upper and right lower extremity. Cerebellar tests unable to obtain due to the patient's weakness in the left arm. Unable to make her stand.  PSYCHIATRIC: Negative for agitation. The patient is oriented x3.  LYMPHATIC: Negative for lymphadenopathy in neck or supraclavicular areas.  MUSCULOSKELETAL: No significant joint effusion or swelling.  SKIN: No rashes, petechiae. Has positive good turgor.   LABORATORY DATA: Glucose is 157, BUN 14, creatinine 0.58, sodium 138, potassium 4. Other electrolytes were within normal limits. LFTs within normal limits. Troponin is negative. White count 7.7, hemoglobin 15, platelets 272. INR 1. CT   IMAGING: CT scan of the head without contrast: No evidence of acute  abnormalities.  Chest x-ray: Minimal bilateral platelike atelectasis.   ELECTROCARDIOGRAM: Normal sinus rhythm. No ST depression or elevation, mild LVH.   ASSESSMENT AND PLAN: This is a very nice 54 year old female with what appears to be another acute stroke. She also has diabetes, hypertension, and she is a smoker.   1. Acute stroke. The patient presented to the Emergency Department with left side weakness, out of the window for tPA. The patient has history of multiple comorbidities and risk factors for peripheral vascular disease, cardiovascular disease and stroke, which are diabetes, hypertension, and she is a smoker, positive family history of coronary artery disease and is slightly overweight. The patient comes today with symptomatology that has not resolved, left side weakness which is significant. The patient has been taking 81 mg of aspirin and 75 mg of dipyridamole 3 times daily. At this moment, what we are going to do is increase her dose of dipyridamole to 400 a day, divided in two, for which we are going to do Aggrenox twice daily. The patient already had her dose of aspirin 325 mg here. Consider changing it to Plavix and aspirin since the patient has significant peripheral vascular disease. We are going to evaluate her first and then make that decision. We are going to get neurology consultation. It is very likely that this is secondary to carotid artery disease as the patient has had ultrasound as an outpatient showing up to 75% blockage. We are going to do a CT angiogram of the neck to evaluate her carotid anatomy better, especially to distinguish if she has any type of ulcerated plaque. MRI of the brain ordered. Echocardiogram ordered as the patient had one previously in 2013, but this is a new event. She does not have any palpitations right now. Neuro checks, speech therapy and physical therapy evaluation.  2. Hypertension. At this moment, we are going to allow hypertension to rise. Her  blood pressure is overall normal, but we are going to do permissive hypertension if necessary. Hold hydrochlorothiazide, hold bisoprolol.  3. Lethargy. The patient seems slightly tired and lethargic. She has been started on a recent antibiotic, Bactrim. At this moment, I do not see any reason of Bactrim creating these symptoms, but we are going to stop it as there is no active infection on her body. The lesion at the level of the right breast seems to be crusty and clear. Also, I am going to hold the amitriptyline and hold the gabapentin that she is taking to make sure she does not have any significant drugs that will cause this effect.  4. Diabetes Start on insulin sliding scale. Continue glimepiride. Continue Victoza. Get a hemoglobin A1c.  5. Hyperlipidemia. Get lipid check since the patient has an  active possible stroke. We are going to increase her atorvastatin from 10 to 40 mg and check lipids.  6. Anxiety. The patient has Xanax at home and continue fluoxetine.  7. Tobacco abuse. The patient has been counseled for this. We are going to give her a nicotine patch. I spent about 5 minutes talking about tobacco cessation. The patient states that she might not be able to quit ever.   TIME SPENT: I spent about 45 minutes with this patient.   ____________________________ Jennings Sink, MD rsg:lb D: 07/29/2013 09:42:09 ET T: 07/29/2013 10:14:30 ET JOB#: [id number]  cc: Glen Aubrey Sink, MD, <Dictator> Zamire Whitehurst America Brown MD ELECTRONICALLY SIGNED 08/10/2013 11:21

## 2014-06-22 DIAGNOSIS — F331 Major depressive disorder, recurrent, moderate: Secondary | ICD-10-CM | POA: Diagnosis not present

## 2014-06-22 DIAGNOSIS — F411 Generalized anxiety disorder: Secondary | ICD-10-CM | POA: Diagnosis not present

## 2014-07-19 DIAGNOSIS — F331 Major depressive disorder, recurrent, moderate: Secondary | ICD-10-CM | POA: Diagnosis not present

## 2014-07-19 DIAGNOSIS — F411 Generalized anxiety disorder: Secondary | ICD-10-CM | POA: Diagnosis not present

## 2014-08-08 DIAGNOSIS — I679 Cerebrovascular disease, unspecified: Secondary | ICD-10-CM | POA: Diagnosis not present

## 2014-08-08 DIAGNOSIS — R87810 Cervical high risk human papillomavirus (HPV) DNA test positive: Secondary | ICD-10-CM | POA: Diagnosis not present

## 2014-08-08 DIAGNOSIS — Z0001 Encounter for general adult medical examination with abnormal findings: Secondary | ICD-10-CM | POA: Diagnosis not present

## 2014-08-08 DIAGNOSIS — I10 Essential (primary) hypertension: Secondary | ICD-10-CM | POA: Diagnosis not present

## 2014-08-08 DIAGNOSIS — F331 Major depressive disorder, recurrent, moderate: Secondary | ICD-10-CM | POA: Diagnosis not present

## 2014-08-08 DIAGNOSIS — F17210 Nicotine dependence, cigarettes, uncomplicated: Secondary | ICD-10-CM | POA: Diagnosis not present

## 2014-08-08 DIAGNOSIS — M150 Primary generalized (osteo)arthritis: Secondary | ICD-10-CM | POA: Diagnosis not present

## 2014-08-08 DIAGNOSIS — Z124 Encounter for screening for malignant neoplasm of cervix: Secondary | ICD-10-CM | POA: Diagnosis not present

## 2014-08-08 DIAGNOSIS — E1140 Type 2 diabetes mellitus with diabetic neuropathy, unspecified: Secondary | ICD-10-CM | POA: Diagnosis not present

## 2014-08-08 DIAGNOSIS — Z0000 Encounter for general adult medical examination without abnormal findings: Secondary | ICD-10-CM | POA: Diagnosis not present

## 2014-08-08 DIAGNOSIS — F411 Generalized anxiety disorder: Secondary | ICD-10-CM | POA: Diagnosis not present

## 2014-08-17 DIAGNOSIS — F331 Major depressive disorder, recurrent, moderate: Secondary | ICD-10-CM | POA: Diagnosis not present

## 2014-08-17 DIAGNOSIS — F411 Generalized anxiety disorder: Secondary | ICD-10-CM | POA: Diagnosis not present

## 2014-09-08 ENCOUNTER — Encounter: Payer: Self-pay | Admitting: Vascular Surgery

## 2014-09-13 ENCOUNTER — Encounter: Payer: Self-pay | Admitting: Vascular Surgery

## 2014-09-13 ENCOUNTER — Ambulatory Visit (INDEPENDENT_AMBULATORY_CARE_PROVIDER_SITE_OTHER): Payer: Medicare Other | Admitting: Vascular Surgery

## 2014-09-13 ENCOUNTER — Ambulatory Visit (HOSPITAL_COMMUNITY)
Admission: RE | Admit: 2014-09-13 | Discharge: 2014-09-13 | Disposition: A | Discharge status: Home / Self Care | Payer: Medicare Other | Source: Ambulatory Visit | Attending: Vascular Surgery | Admitting: Vascular Surgery

## 2014-09-13 VITALS — BP 119/80 | HR 86 | Ht 67.0 in | Wt 201.5 lb

## 2014-09-13 DIAGNOSIS — I6523 Occlusion and stenosis of bilateral carotid arteries: Secondary | ICD-10-CM | POA: Diagnosis not present

## 2014-09-13 NOTE — Progress Notes (Signed)
Vascular and Vein Specialist of [facility] PATIENT  Patient name: Morgan Daniels MRN: [id number] DOB: Jan 19, 1961 Sex: female  REASON FOR VISIT: Follow up of carotid disease.  HPI: Morgan Daniels is a 54 y.o. female who underwent a right carotid endarterectomy in June 2015. Prior to that she had undergone a left carotid endarterectomy in March 2014. She comes in for yearly follow up carotid duplex scan. I last saw the patient on 09/07/2013. She was doing well at that time and was on aspirin and a statin.  Since I saw her last, she has been doing well. She denies any focal weakness or paresthesias. She is on aspirin and is on a statin. She also has quit smoking.  Past Medical History  Diagnosis Date  . IBS (irritable bowel syndrome)   . Plantar fasciitis   . Fatty liver   . Diabetes   . Carotid artery occlusion   . Hypertension   . Anxiety   . Depression   . GERD (gastroesophageal reflux disease)   . H/O hiatal hernia   . Fibromyalgia   . Stroke Dec. 14,2013    Right side-ministroke   Family History  Problem Relation Age of Onset  . Hypertension Mother   . Hyperlipidemia Mother   . Deep vein thrombosis Mother   . Cancer Father   . Hypertension Maternal Grandmother   . Deep vein thrombosis Sister    SOCIAL HISTORY: History  Substance Use Topics  . Smoking status: Former Smoker -- 1.00 packs/day for 40 years    Types: Cigarettes  . Smokeless tobacco: Never Used     Comment: using vabor   . Alcohol Use: 0.6 oz/week    1 Glasses of wine per week     Comment: once a month   Allergies  Allergen Reactions  . Simvastatin Diarrhea and Nausea And Vomiting  . Morphine And Related Itching  . Latex Rash   Current Outpatient Prescriptions  Medication Sig Dispense Refill  . ALPRAZolam (XANAX) 0.5 MG tablet Take 0.5 mg by mouth 3 (three) times daily.     [name] amitriptyline (ELAVIL) 100 MG tablet Take 100 mg by mouth at bedtime.    [name] aspirin EC 81 MG tablet Take 81  mg by mouth daily.    [name] atorvastatin (LIPITOR) 10 MG tablet Take 10 mg by mouth daily.    . bisoprolol (ZEBETA) 5 MG tablet Take 2.5 mg by mouth daily.    . cetirizine (ZYRTEC) 10 MG tablet Take 10 mg by mouth daily.    . Cholecalciferol (VITAMIN D-3) 5000 UNITS TABS Take 5,000 Units by mouth daily.     [name] FLUoxetine (PROZAC) 40 MG capsule Take 60 mg by mouth daily.     [name] gabapentin (NEURONTIN) 300 MG capsule Take 600 mg by mouth 2 (two) times daily.    . hydrochlorothiazide (MICROZIDE) 12.5 MG capsule Take 12.5 mg by mouth daily.    . insulin aspart (NOVOLOG) 100 UNIT/ML injection Sliding scale    . Insulin Degludec (TRESIBA FLEXTOUCH [place]) Inject into the skin.    [name] NOVOFINE 32G X 6 MM MISC     . ondansetron (ZOFRAN) 8 MG tablet Take 8 mg by mouth 2 (two) times daily as needed for nausea.     . pantoprazole (PROTONIX) 40 MG tablet Take 40 mg by mouth 2 (two) times daily.     . traMADol (ULTRAM) 50 MG tablet Take 2 tablets (100 mg total) by mouth 2 (two) times  daily. 30 tablet 0  . VICTOZA 18 MG/3ML SOPN 1.2 mg daily.     . clopidogrel (PLAVIX) 75 MG tablet Take 75 mg by mouth daily with breakfast.     . glimepiride (AMARYL) 4 MG tablet Take 4 mg by mouth 2 (two) times daily.     . insulin lispro (HUMALOG) 100 UNIT/ML injection Inject 4-6 Units into the skin 3 (three) times daily before meals. Sliding scale    . nicotine (NICODERM CQ - DOSED IN MG/24 HOURS) 21 mg/24hr patch Place 1 patch onto the skin daily.     No current facility-administered medications for this visit.   REVIEW OF SYSTEMS: Valu.Nieves ] denotes positive finding; [  ] denotes negative finding  CARDIOVASCULAR:  [ ]  chest pain   [ ]  chest pressure   [ ]  palpitations   [ ]  orthopnea   [ ]  dyspnea on exertion   [ ]  claudication   [ ]  rest pain   [ ]  DVT   [ ]  phlebitis PULMONARY:   [ ]  productive cough   [ ]  asthma   [ ]  wheezing NEUROLOGIC:   [ ]  weakness  [ ]  paresthesias  [ ]  aphasia  [ ]  amaurosis  [ ]  dizziness HEMATOLOGIC:   [ ]   bleeding problems   [ ]  clotting disorders MUSCULOSKELETAL:  [ ]  joint pain   [ ]  joint swelling [ ]  leg swelling GASTROINTESTINAL: [ ]   blood in stool  [ ]   hematemesis GENITOURINARY:  [ ]   dysuria  [ ]   hematuria PSYCHIATRIC:  Valu.Nieves ] history of major depression INTEGUMENTARY:  [ ]  rashes  [ ]  ulcers CONSTITUTIONAL:  [ ]  fever   [ ]  chills  PHYSICAL EXAM: Filed Vitals:   09/13/14 1608 09/13/14 1610  BP: 117/82 119/80  Pulse: 86   Height: 5\' 7"  (1.702 m)   Weight: 201 lb 8 oz (91.4 kg)   SpO2: 95%    GENERAL: The patient is a well-nourished female, in no acute distress. The vital signs are documented above. CARDIAC: There is a regular rate and rhythm.  VASCULAR: I do not detect carotid bruits. PULMONARY: There is good air exchange bilaterally without wheezing or rales. ABDOMEN: Soft and non-tender with normal pitched bowel sounds.  MUSCULOSKELETAL: There are no major deformities or cyanosis. NEUROLOGIC: No focal weakness or paresthesias are detected. SKIN: There are no ulcers or rashes noted. PSYCHIATRIC: The patient has a normal affect.  DATA:  I have independently interpreted her carotid duplex scan which shows that both carotid endarterectomy sites are widely patent. There is no evidence of recurrent stenosis on either side. Both vertebral arteries are patent with only direct flow.  MEDICAL ISSUES: BILATERAL CAROTID STENOSES: The patient is doing well status post bilateral carotid endarterectomies. There is no evidence of recurrent stenosis. The patient is on aspirin and is on a statin. She has quit smoking. I have ordered a follow up carotid duplex scan in 1 year and she'll be seen by her nurse practitioner on a yearly basis for continued follow up of her carotid disease. She knows to call sooner she has problems.   Return in about 1 year (around 09/13/2015).   Deitra Mayo V[facility] and Vein Specialists of [place]: [phone]

## 2014-09-21 DIAGNOSIS — N390 Urinary tract infection, site not specified: Secondary | ICD-10-CM | POA: Diagnosis not present

## 2014-09-21 DIAGNOSIS — I10 Essential (primary) hypertension: Secondary | ICD-10-CM | POA: Diagnosis not present

## 2014-09-21 DIAGNOSIS — I679 Cerebrovascular disease, unspecified: Secondary | ICD-10-CM | POA: Diagnosis not present

## 2014-09-21 DIAGNOSIS — E1165 Type 2 diabetes mellitus with hyperglycemia: Secondary | ICD-10-CM | POA: Diagnosis not present

## 2014-09-29 ENCOUNTER — Ambulatory Visit: Payer: Medicare Other | Admitting: Psychiatry

## 2014-10-19 DIAGNOSIS — F331 Major depressive disorder, recurrent, moderate: Secondary | ICD-10-CM | POA: Diagnosis not present

## 2014-10-19 DIAGNOSIS — I10 Essential (primary) hypertension: Secondary | ICD-10-CM | POA: Diagnosis not present

## 2014-10-19 DIAGNOSIS — F17210 Nicotine dependence, cigarettes, uncomplicated: Secondary | ICD-10-CM | POA: Diagnosis not present

## 2014-10-19 DIAGNOSIS — E1165 Type 2 diabetes mellitus with hyperglycemia: Secondary | ICD-10-CM | POA: Diagnosis not present

## 2014-10-19 DIAGNOSIS — F411 Generalized anxiety disorder: Secondary | ICD-10-CM | POA: Diagnosis not present

## 2014-10-19 DIAGNOSIS — I679 Cerebrovascular disease, unspecified: Secondary | ICD-10-CM | POA: Diagnosis not present

## 2014-10-19 DIAGNOSIS — N390 Urinary tract infection, site not specified: Secondary | ICD-10-CM | POA: Diagnosis not present

## 2014-10-19 DIAGNOSIS — M150 Primary generalized (osteo)arthritis: Secondary | ICD-10-CM | POA: Diagnosis not present

## 2014-11-07 ENCOUNTER — Ambulatory Visit (INDEPENDENT_AMBULATORY_CARE_PROVIDER_SITE_OTHER): Payer: Medicare Other | Admitting: Podiatry

## 2014-11-07 ENCOUNTER — Ambulatory Visit (INDEPENDENT_AMBULATORY_CARE_PROVIDER_SITE_OTHER): Payer: Medicare Other

## 2014-11-07 ENCOUNTER — Encounter: Payer: Self-pay | Admitting: Podiatry

## 2014-11-07 VITALS — BP 134/80 | HR 93 | Resp 18

## 2014-11-07 DIAGNOSIS — R52 Pain, unspecified: Secondary | ICD-10-CM | POA: Diagnosis not present

## 2014-11-07 DIAGNOSIS — I6523 Occlusion and stenosis of bilateral carotid arteries: Secondary | ICD-10-CM

## 2014-11-07 DIAGNOSIS — T148 Other injury of unspecified body region: Secondary | ICD-10-CM | POA: Diagnosis not present

## 2014-11-07 DIAGNOSIS — T148XXA Other injury of unspecified body region, initial encounter: Secondary | ICD-10-CM

## 2014-11-07 NOTE — Progress Notes (Signed)
Patient ID: Morgan Daniels, female   DOB: 1961/02/12, 54 y.o.   MRN: [id number]  Subjective: 54 year old female presents the office today for concerns of consistent bleeding to her right big toe after her dog's toenail puncture the skin approximate 2 months ago. She states that since the injury the toe continues to bleed. It'll scab over was a scab falls off a continues to bleed. She's having difficulty healing this. She is placing her primary care physician who placed on antibiotics for which she is completed. She states that her tetanus status is up-to-date. She denies any surrounding redness or red streaks. Denies any pus. No swelling to the area. She has been soaking in Epson salt soaks. She applies antibiotic ointment and a Band-Aid. No other complaints at this time.  Objective: AAO 3, NAD DP/PT pulses palpable, CRT less than 3 seconds Protective sensation appears to be intact with Derrel Nip monofilament The right hallux toenail has previously been removed. At the distal portion does appear to be a small area of hyper granulation tissue over the site of the puncture. Small amount of active bleeding is present. There is no surrounding erythema, ascending cellulitis, fluctuance, crepitus, malodor, purulence. There is mild tenderness to palpation over the puncture site. No other open lesions or pre-ulcerative lesions identified bilaterally. No pain with calf compression, swelling, warmth, erythema.  Assessment: 54 year old female right hallux puncture wound  Plan: -X-rays were obtained and reviewed with the patient.  -Treatment options discussed including all alternatives, risks, and complications -Over the area of hyper granulation tissue overlying the puncture site Silver nitrate was applied. -Recommended continue soaking in Epson salt soaks, antibiotic ointment and a Band-Aid. -Monitor for any clinical signs or symptoms of infection and directed to call the office immediately should  any occur or go to the ER. -Follow-up 2 weeks or sooner if any problems arise. In the meantime, encouraged to call the office with any questions, concerns, change in symptoms.   Celesta Gentile, DPM

## 2014-11-09 ENCOUNTER — Encounter: Payer: Self-pay | Admitting: Psychiatry

## 2014-11-09 ENCOUNTER — Ambulatory Visit (INDEPENDENT_AMBULATORY_CARE_PROVIDER_SITE_OTHER): Payer: Medicare Other | Admitting: Psychiatry

## 2014-11-09 VITALS — BP 138/86 | HR 105 | Temp 97.8°F | Ht 67.0 in | Wt 203.2 lb

## 2014-11-09 DIAGNOSIS — F331 Major depressive disorder, recurrent, moderate: Secondary | ICD-10-CM

## 2014-11-09 DIAGNOSIS — I6523 Occlusion and stenosis of bilateral carotid arteries: Secondary | ICD-10-CM | POA: Diagnosis not present

## 2014-11-09 MED ORDER — QUETIAPINE FUMARATE 50 MG PO TABS
ORAL_TABLET | ORAL | Status: DC
Start: 2014-11-09 — End: 2014-12-07
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
## 2014-11-13 ENCOUNTER — Ambulatory Visit (INDEPENDENT_AMBULATORY_CARE_PROVIDER_SITE_OTHER): Payer: Medicare Other | Admitting: Licensed Clinical Social Worker

## 2014-11-13 DIAGNOSIS — F331 Major depressive disorder, recurrent, moderate: Secondary | ICD-10-CM
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
## 2014-11-21 ENCOUNTER — Ambulatory Visit: Payer: Medicare Other | Admitting: Podiatry

## 2014-11-27 ENCOUNTER — Ambulatory Visit (INDEPENDENT_AMBULATORY_CARE_PROVIDER_SITE_OTHER): Payer: Medicare Other | Admitting: Licensed Clinical Social Worker

## 2014-11-27 DIAGNOSIS — F331 Major depressive disorder, recurrent, moderate: Secondary | ICD-10-CM | POA: Diagnosis not present

## 2014-11-29 DIAGNOSIS — E119 Type 2 diabetes mellitus without complications: Secondary | ICD-10-CM | POA: Diagnosis not present

## 2014-12-01 NOTE — Progress Notes (Signed)
   THERAPIST PROGRESS NOTE  Session Time: 73min  Participation Level: Active  Behavioral Response: CasualAlertDepressed  Type of Therapy: Individual Therapy  Treatment Goals addressed: Diagnosis: Major Depression  Interventions: CBT, Motivational Interviewing and Reframing  Summary: Morgan Daniels is a 54 y.o. female who presents with symptoms of her diagnosis. She was able to receive psychoeducation on her diagnosis and was able to list her current symptoms and the history of her symptoms.     Suicidal/Homicidal: Nowithout intent/plan  Therapist Response: Therapist provided psychoeducation to patient to ensure comprehension of diagnosis  Plan: Return again in 2 weeks.  Diagnosis: Axis I: Major Depression    Axis II: No diagnosis    Lubertha South 12/01/2014

## 2014-12-07 ENCOUNTER — Other Ambulatory Visit
Admission: RE | Admit: 2014-12-07 | Discharge: 2014-12-07 | Disposition: A | Discharge status: Home / Self Care | Payer: Medicare Other | Source: Ambulatory Visit | Attending: Nurse Practitioner | Admitting: Nurse Practitioner

## 2014-12-07 ENCOUNTER — Encounter: Payer: Self-pay | Admitting: Psychiatry

## 2014-12-07 ENCOUNTER — Ambulatory Visit (INDEPENDENT_AMBULATORY_CARE_PROVIDER_SITE_OTHER): Payer: Medicare Other | Admitting: Psychiatry

## 2014-12-07 VITALS — BP 118/72 | HR 98 | Temp 97.5°F | Ht 67.0 in | Wt 202.4 lb

## 2014-12-07 DIAGNOSIS — F331 Major depressive disorder, recurrent, moderate: Secondary | ICD-10-CM

## 2014-12-07 DIAGNOSIS — R5383 Other fatigue: Secondary | ICD-10-CM | POA: Diagnosis not present

## 2014-12-07 DIAGNOSIS — M797 Fibromyalgia: Secondary | ICD-10-CM | POA: Insufficient documentation

## 2014-12-07 DIAGNOSIS — M150 Primary generalized (osteo)arthritis: Secondary | ICD-10-CM | POA: Diagnosis not present

## 2014-12-07 DIAGNOSIS — E669 Obesity, unspecified: Secondary | ICD-10-CM | POA: Diagnosis not present

## 2014-12-07 DIAGNOSIS — E1165 Type 2 diabetes mellitus with hyperglycemia: Secondary | ICD-10-CM | POA: Diagnosis not present

## 2014-12-07 DIAGNOSIS — F411 Generalized anxiety disorder: Secondary | ICD-10-CM | POA: Diagnosis not present

## 2014-12-07 DIAGNOSIS — I69320 Aphasia following cerebral infarction: Secondary | ICD-10-CM | POA: Diagnosis not present

## 2014-12-07 DIAGNOSIS — I10 Essential (primary) hypertension: Secondary | ICD-10-CM | POA: Diagnosis not present

## 2014-12-07 DIAGNOSIS — I6523 Occlusion and stenosis of bilateral carotid arteries: Secondary | ICD-10-CM

## 2014-12-07 DIAGNOSIS — F17210 Nicotine dependence, cigarettes, uncomplicated: Secondary | ICD-10-CM | POA: Diagnosis not present

## 2014-12-07 LAB — COMPREHENSIVE METABOLIC PANEL
ALK PHOS: 73 U/L (ref 38–126)
ALT: 53 U/L (ref 14–54)
AST: 57 U/L — AB (ref 15–41)
Albumin: 4.3 g/dL (ref 3.5–5.0)
Anion gap: 10 (ref 5–15)
BUN: 19 mg/dL (ref 6–20)
CALCIUM: 9.8 mg/dL (ref 8.9–10.3)
CHLORIDE: 98 mmol/L — AB (ref 101–111)
CO2: 26 mmol/L (ref 22–32)
CREATININE: 0.52 mg/dL (ref 0.44–1.00)
Glucose, Bld: 219 mg/dL — ABNORMAL HIGH (ref 65–99)
Potassium: 4.1 mmol/L (ref 3.5–5.1)
Sodium: 134 mmol/L — ABNORMAL LOW (ref 135–145)
Total Bilirubin: 0.4 mg/dL (ref 0.3–1.2)
Total Protein: 8.4 g/dL — ABNORMAL HIGH (ref 6.5–8.1)

## 2014-12-07 LAB — CBC
HEMATOCRIT: 42.9 % (ref 35.0–47.0)
HEMOGLOBIN: 14.8 g/dL (ref 12.0–16.0)
MCH: 29.5 pg (ref 26.0–34.0)
MCHC: 34.4 g/dL (ref 32.0–36.0)
MCV: 85.8 fL (ref 80.0–100.0)
PLATELETS: 270 10*3/uL (ref 150–440)
RBC: 5 MIL/uL (ref 3.80–5.20)
RDW: 12.7 % (ref 11.5–14.5)
WBC: 7.4 10*3/uL (ref 3.6–11.0)

## 2014-12-07 LAB — LIPID PANEL
CHOLESTEROL: 241 mg/dL — AB (ref 0–200)
HDL: 36 mg/dL — ABNORMAL LOW (ref >40)
LDL CALC: 139 mg/dL — AB (ref 0–99)
Total CHOL/HDL Ratio: 6.7 RATIO
Triglycerides: 332 mg/dL — ABNORMAL HIGH (ref <150)
VLDL: 66 mg/dL — AB (ref 0–40)

## 2014-12-07 LAB — TSH: TSH: 1.238 u[IU]/mL (ref 0.350–4.500)

## 2014-12-07 LAB — SEDIMENTATION RATE: SED RATE: 12 mm/h (ref 0–30)

## 2014-12-07 LAB — T4, FREE: FREE T4: 0.76 ng/dL (ref 0.61–1.12)

## 2014-12-07 MED ORDER — ARIPIPRAZOLE 2 MG PO TABS
2.0000 mg | ORAL_TABLET | ORAL | Status: DC
Start: 2014-12-07 — End: 2015-01-22

## 2014-12-07 NOTE — Progress Notes (Signed)
BH MD/PA/NP OP Progress Note  12/07/2014 2:59 PM Morgan Daniels  MRN:  [id number]  Subjective:  Patient returns for follow-up of her major depressive disorder, moderate, recurrent. She states that the Seroquel that we added for augmentation at the last visit was effective when she first took it at 50 mg for the first few days. However she states then when she increased the dose to 100 mg it was too much for her she could not function well the next day. She states she then returned back to the 50 mg but notices it is not providing the benefit that she described in the first few days. She states the first few days she noticed that on the 50 mg she was a little more energetic. She states now she continues to have some depression and also irritability. Chief Complaint: Irritability Chief Complaint    Follow-up; Medication Refill; Medication Problem     Visit Diagnosis:  No diagnosis found.  Past Medical History:  Past Medical History  Diagnosis Date  . IBS (irritable bowel syndrome)   . Plantar fasciitis   . Fatty liver   . Diabetes ([place])   . Carotid artery occlusion   . Hypertension   . Anxiety   . Depression   . GERD (gastroesophageal reflux disease)   . H/O hiatal hernia   . Fibromyalgia   . Stroke [facility]) Dec. 14,2013    Right side-ministroke    Past Surgical History  Procedure Laterality Date  . Back surgery  2000, 2004  . Hernia repair    . Abdominal hysterectomy  1990  . Cholecystectomy  2001  . Endarterectomy Left 05/06/2012    Procedure: ENDARTERECTOMY CAROTID;  Surgeon: Angelia Mould, MD;  Location: [place];  Service: Vascular;  Laterality: Left;  . Patch angioplasty Left 05/06/2012    Procedure: WITH DACRON PATCH ANGIOPLASTY ;  Surgeon: Angelia Mould, MD;  Location: [place];  Service: Vascular;  Laterality: Left;  . Carotid endarterectomy Left 05/06/12  . Spine surgery  2004  . Endarterectomy Right 08/09/2013    Procedure: ENDARTERECTOMY CAROTID-RIGHT;   Surgeon: Angelia Mould, MD;  Location: [place];  Service: Vascular;  Laterality: Right;  . Cerebral angiogram Bilateral 05/03/2012    Procedure: CEREBRAL ANGIOGRAM;  Surgeon: Angelia Mould, MD;  Location: [facility] CATH LAB;  Service: Cardiovascular;  Laterality: Bilateral;   Family History:  Family History  Problem Relation Age of Onset  . Hypertension Mother   . Hyperlipidemia Mother   . Deep vein thrombosis Mother   . Cancer Father   . Alcohol abuse Father   . Hypertension Maternal Grandmother   . Deep vein thrombosis Sister   . Alcohol abuse Sister   . Alcohol abuse Sister    Social History:  Social History   Social History  . Marital Status: Married    Spouse Name: N/A  . Number of Children: N/A  . Years of Education: N/A   Social History Main Topics  . Smoking status: Former Smoker -- 1.00 packs/day for 40 years    Types: Cigarettes    Start date: 11/09/1970    Quit date: 11/08/2012  . Smokeless tobacco: Never Used     Comment: using vabor   . Alcohol Use: No     Comment:   quite i2 yearts   . Drug Use: No  . Sexual Activity: No   Other Topics Concern  . None   Social History Narrative   Additional History:   Assessment:  Musculoskeletal: Strength & Muscle Tone: within normal limits Gait & Station: normal Patient leans: N/A  Psychiatric Specialty Exam: HPI  Review of Systems  Psychiatric/Behavioral: Negative for depression, suicidal ideas, hallucinations, memory loss and substance abuse. The patient is not nervous/anxious and does not have insomnia.        Irritability  All other systems reviewed and are negative.   Blood pressure 118/72, pulse 98, temperature 97.5 F (36.4 C), temperature source Tympanic, height 5\' 7"  (1.702 m), weight 202 lb 6.4 oz (91.808 kg), SpO2 92 %.Body mass index is 31.69 kg/(m^2).  General Appearance: Well Groomed  Eye Contact:  Good  Speech:  Normal Rate  Volume:  Normal  Mood:  Irritable  Affect:  Constricted   Thought Process:  Linear  Orientation:  Full (Time, Place, and Person)  Thought Content:  Negative  Suicidal Thoughts:  No  Homicidal Thoughts:  No  Memory:  Immediate;   Good Recent;   Good Remote;   Good  Judgement:  Good  Insight:  Good  Psychomotor Activity:  Negative  Concentration:  Good  Recall:  Good  Fund of Knowledge: Good  Language: Good  Akathisia:  Negative  Handed:    AIMS (if indicated):  Not done today  Assets:  Communication Skills Desire for Improvement Social Support  ADL's:  Intact  Cognition: WNL  Sleep:  fair   Is the patient at risk to self?  No. Has the patient been a risk to self in the past 6 months?  No. Has the patient been a risk to self within the distant past?  No. Is the patient a risk to others?  No. Has the patient been a risk to others in the past 6 months?  No. Has the patient been a risk to others within the distant past?  No.  Current Medications: Current Outpatient Prescriptions  Medication Sig Dispense Refill  . ALPRAZolam (XANAX) 0.5 MG tablet Take 0.5 mg by mouth 3 (three) times daily.     [name] aspirin EC 81 MG tablet Take 81 mg by mouth daily.    [name] atorvastatin (LIPITOR) 10 MG tablet Take 10 mg by mouth daily.    . bisoprolol (ZEBETA) 5 MG tablet Take 2.5 mg by mouth daily.    . celecoxib (CELEBREX) 200 MG capsule TAKE 1 CAPSULE(S) BY MOUTH DAILY FOR PAIN  2  . cetirizine (ZYRTEC) 10 MG tablet Take 10 mg by mouth daily.    . Cholecalciferol (VITAMIN D-3) 5000 UNITS TABS Take 5,000 Units by mouth daily.     . clopidogrel (PLAVIX) 75 MG tablet Take 75 mg by mouth daily with breakfast.     . fluconazole (DIFLUCAN) 150 MG tablet TAKE 1 TABLET BY MOUTH ONCE FOR YEAST INFECTION. MAY REPEAT IN 3 DAYS FOR PERSISTENT SYMPTOMS  1  . FLUoxetine (PROZAC) 20 MG capsule Take 60 mg by mouth daily.  5  . FLUoxetine (PROZAC) 40 MG capsule Take 60 mg by mouth daily.     [name] gabapentin (NEURONTIN) 300 MG capsule Take 600 mg by mouth 2 (two) times  daily.    [name] glimepiride (AMARYL) 4 MG tablet Take 4 mg by mouth 2 (two) times daily.     . insulin aspart (NOVOLOG) 100 UNIT/ML injection Sliding scale    . Insulin Degludec (TRESIBA FLEXTOUCH [place]) Inject into the skin.    [name] insulin lispro (HUMALOG) 100 UNIT/ML injection Inject 4-6 Units into the skin 3 (three) times daily before meals. Sliding scale    .  nicotine (NICODERM CQ - DOSED IN MG/24 HOURS) 21 mg/24hr patch Place 1 patch onto the skin daily.    [name] NOVOFINE 32G X 6 MM MISC     . ondansetron (ZOFRAN) 8 MG tablet Take 8 mg by mouth 2 (two) times daily as needed for nausea.     Glory Rosebush DELICA LANCETS FINE MISC CHECK BLOOD SUGAR THREE TIMES A DAY  5  . ONETOUCH VERIO test strip CHECK BLOOD SUGAR THREE TIMES A DAY  5  . pantoprazole (PROTONIX) 40 MG tablet Take 40 mg by mouth 2 (two) times daily.     [name] tiZANidine (ZANAFLEX) 4 MG tablet TAKE 1 TABLET BY MOUTH TWICE A DAY AS NEEDED FOR MUSCLE PAIN AND SPASMS  2  . traMADol (ULTRAM) 50 MG tablet Take 2 tablets (100 mg total) by mouth 2 (two) times daily. 30 tablet 0  . VICTOZA 18 MG/3ML SOPN 1.2 mg daily.     . ARIPiprazole (ABILIFY) 2 MG tablet Take 1 tablet (2 mg total) by mouth every morning. 30 tablet 1  . hydrochlorothiazide (HYDRODIURIL) 12.5 MG tablet TAKE 1 TABLET BY MOUTH DAILY FOR BLOOD PRESSURE/EDEMA  1   No current facility-administered medications for this visit.    Medical Decision Making:  Established Problem, Worsening (2), Review of Medication Regimen & Side Effects (2) and Review of New Medication or Change in Dosage (2)  Treatment Plan Summary:Medication management and Plan   Patient's symptoms are consistent with major depressive disorder, moderate recurrent. Her irritability complaints do not seem to rise to level of mania or hypomania but perhaps she is got major depressive disorder with mixed features. 's been on the Prozac 60 mg. We are even try to augment with Abilify 2 mg in the morning. She will continue on her  alprazolam 0.5 mg 3 times a day.  Patient has been fairly stable on her current regimen but reports she is not feeling as good of a mood as she would like. She also notes that her mood lability is a concern in that she can snap and become angry quickly. As might be more consistent with personality however should be part of mixed traits from depression we will attempt to address it with Abilify.Risk and benefits of Abilify been discussed including metabolic issues. Patient is able to consent.   Asian will follow up in 1 month. She's been encouraged colony questions concerns prior to her next appointment.   Faith Rogue 12/07/2014, 2:59 PM

## 2014-12-08 LAB — T3: T3 TOTAL: 148 ng/dL (ref 71–180)

## 2014-12-08 LAB — MICROALBUMIN, URINE: MICROALB UR: 29.2 ug/mL — AB

## 2014-12-08 LAB — RHEUMATOID FACTOR: Rhuematoid fact SerPl-aCnc: <10 IU/mL (ref 0.0–13.9)

## 2014-12-08 LAB — VITAMIN D 25 HYDROXY (VIT D DEFICIENCY, FRACTURES): Vit D, 25-Hydroxy: 47.4 ng/mL (ref 30.0–100.0)

## 2014-12-11 ENCOUNTER — Ambulatory Visit: Payer: Self-pay | Admitting: Licensed Clinical Social Worker

## 2014-12-21 DIAGNOSIS — D225 Melanocytic nevi of trunk: Secondary | ICD-10-CM | POA: Diagnosis not present

## 2014-12-21 DIAGNOSIS — L918 Other hypertrophic disorders of the skin: Secondary | ICD-10-CM | POA: Diagnosis not present

## 2014-12-21 DIAGNOSIS — L821 Other seborrheic keratosis: Secondary | ICD-10-CM | POA: Diagnosis not present

## 2014-12-21 DIAGNOSIS — D2262 Melanocytic nevi of left upper limb, including shoulder: Secondary | ICD-10-CM | POA: Diagnosis not present

## 2014-12-21 DIAGNOSIS — L980 Pyogenic granuloma: Secondary | ICD-10-CM | POA: Diagnosis not present

## 2014-12-21 DIAGNOSIS — L820 Inflamed seborrheic keratosis: Secondary | ICD-10-CM | POA: Diagnosis not present

## 2014-12-21 DIAGNOSIS — L538 Other specified erythematous conditions: Secondary | ICD-10-CM | POA: Diagnosis not present

## 2014-12-21 DIAGNOSIS — D485 Neoplasm of uncertain behavior of skin: Secondary | ICD-10-CM | POA: Diagnosis not present

## 2014-12-22 ENCOUNTER — Telehealth: Payer: Self-pay | Admitting: Psychiatry

## 2014-12-22 ENCOUNTER — Ambulatory Visit: Payer: Medicare Other | Admitting: Licensed Clinical Social Worker

## 2014-12-25 NOTE — Telephone Encounter (Signed)
Patient contact the clinic on 12/22/2014. I returned her phone call today. I received a voicemail and thus left my name and contact information. She reported problems with sleeping. AW

## 2014-12-26 DIAGNOSIS — E1140 Type 2 diabetes mellitus with diabetic neuropathy, unspecified: Secondary | ICD-10-CM | POA: Diagnosis not present

## 2014-12-26 DIAGNOSIS — F17200 Nicotine dependence, unspecified, uncomplicated: Secondary | ICD-10-CM | POA: Diagnosis not present

## 2014-12-26 DIAGNOSIS — M150 Primary generalized (osteo)arthritis: Secondary | ICD-10-CM | POA: Diagnosis not present

## 2014-12-27 DIAGNOSIS — Z23 Encounter for immunization: Secondary | ICD-10-CM | POA: Diagnosis not present

## 2015-01-02 ENCOUNTER — Ambulatory Visit (INDEPENDENT_AMBULATORY_CARE_PROVIDER_SITE_OTHER): Payer: Medicare Other | Admitting: Licensed Clinical Social Worker

## 2015-01-02 DIAGNOSIS — F331 Major depressive disorder, recurrent, moderate: Secondary | ICD-10-CM | POA: Diagnosis not present

## 2015-01-03 ENCOUNTER — Ambulatory Visit: Payer: Medicare Other | Admitting: Psychiatry

## 2015-01-10 NOTE — Progress Notes (Signed)
   THERAPIST PROGRESS NOTE  Session Time: 14min  Participation Level: Active  Behavioral Response: CasualAlertAppropriate  Type of Therapy: Individual Therapy  Treatment Goals addressed: Coping  Interventions: CBT, Motivational Interviewing, Solution Focused, Strength-based, Supportive, Family Systems and Reframing  Summary: Morgan Daniels is a 54 y.o. female who presents with continued symptoms of her diagnosis. Patient has spent time with a friend to assist with reduction in symptoms. Patient was in [place] with friend for about 2 weeks and was able to relax and reduce stress. Patient uses her pets  to assist with emotional regulation. Patient has been able to sleep more. Patient listed stressors and was able to identify one stressor that worsens her symptoms. She continues to build a closet for her bedroom which is currently the main focus of her stress. Patient will continue to journal and become self-aware to reduce symptoms  Suicidal/Homicidal: Nowithout intent/plan  Therapist Response: LCSW provided Patient with ongoing emotional support and encouragement.  Normalized her feelings.  Commended Patient on her progress and reinforced the importance of client staying focused on her own strengths and resources and resiliency. Processed various strategies for dealing with stressors.    Plan: Return again in 2 weeks.  Diagnosis: Axis I: Depression    Axis II: No diagnosis    Lubertha South 01/10/2015

## 2015-01-17 ENCOUNTER — Ambulatory Visit: Payer: Medicare Other | Admitting: Licensed Clinical Social Worker

## 2015-01-22 ENCOUNTER — Encounter: Payer: Self-pay | Admitting: Psychiatry

## 2015-01-22 ENCOUNTER — Ambulatory Visit (INDEPENDENT_AMBULATORY_CARE_PROVIDER_SITE_OTHER): Payer: Medicare Other | Admitting: Psychiatry

## 2015-01-22 VITALS — BP 124/86 | HR 105 | Temp 98.4°F | Ht 67.0 in | Wt 205.4 lb

## 2015-01-22 DIAGNOSIS — I6523 Occlusion and stenosis of bilateral carotid arteries: Secondary | ICD-10-CM | POA: Diagnosis not present

## 2015-01-22 DIAGNOSIS — F331 Major depressive disorder, recurrent, moderate: Secondary | ICD-10-CM

## 2015-01-22 MED ORDER — FLUOXETINE HCL 20 MG PO CAPS: 60.0000 mg | ORAL_CAPSULE | Freq: Every day | ORAL | Status: DC

## 2015-01-22 MED ORDER — CLONAZEPAM 0.5 MG PO TABS: 0.5000 mg | ORAL_TABLET | Freq: Two times a day (BID) | ORAL | Status: DC | PRN

## 2015-01-22 MED ORDER — ARIPIPRAZOLE 2 MG PO TABS: 2.0000 mg | ORAL_TABLET | ORAL | Status: DC

## 2015-01-22 NOTE — Progress Notes (Signed)
[facility] MD/PA/NP OP Progress Note  01/22/2015 10:20 AM Morgan Daniels  MRN:  [id number]  Subjective:  Patient returns for follow-up of her major depressive disorder, moderate, recurrent. She indicates she felt like the Abilify may have helped even out her mood slightly. However she states the biggest problem with Abilify is that it made her restless and perhaps exacerbated her irritability. She stated that which she has been doing to address this as taking 25 mg of Seroquel at night and that has somehow settle down. I indicated we do not typically prescribe Seroquel and Abilify. She said she continues to use her Xanax that was principal's cry by her primary care doctor. However I discussed that she might be having some akathisia related to Abilify and thus we could try some Klonopin but that use of the alprazolam with it should be avoided.  She indicated she is not traveling anywhere but does plan some holiday events with her grandchildren. He indicated her sleep has only been fair but she declines any interest and sleep medications.  I did discuss my departure in February with patient. Chief Complaint: Irritability Chief Complaint    Follow-up; Medication Refill     Visit Diagnosis:     ICD-9-CM ICD-10-CM   1. Major depressive disorder, recurrent episode, moderate (HCC) 296.32 F33.1     Past Medical History:  Past Medical History  Diagnosis Date  . IBS (irritable bowel syndrome)   . Plantar fasciitis   . Fatty liver   . Diabetes ([place])   . Carotid artery occlusion   . Hypertension   . Anxiety   . Depression   . GERD (gastroesophageal reflux disease)   . H/O hiatal hernia   . Fibromyalgia   . Stroke [facility]) Dec. 14,2013    Right side-ministroke    Past Surgical History  Procedure Laterality Date  . Back surgery  2000, 2004  . Hernia repair    . Abdominal hysterectomy  1990  . Cholecystectomy  2001  . Endarterectomy Left 05/06/2012    Procedure: ENDARTERECTOMY CAROTID;  Surgeon:  Angelia Mould, MD;  Location: [place];  Service: Vascular;  Laterality: Left;  . Patch angioplasty Left 05/06/2012    Procedure: WITH DACRON PATCH ANGIOPLASTY ;  Surgeon: Angelia Mould, MD;  Location: [place];  Service: Vascular;  Laterality: Left;  . Carotid endarterectomy Left 05/06/12  . Spine surgery  2004  . Endarterectomy Right 08/09/2013    Procedure: ENDARTERECTOMY CAROTID-RIGHT;  Surgeon: Angelia Mould, MD;  Location: [place];  Service: Vascular;  Laterality: Right;  . Cerebral angiogram Bilateral 05/03/2012    Procedure: CEREBRAL ANGIOGRAM;  Surgeon: Angelia Mould, MD;  Location: [facility] CATH LAB;  Service: Cardiovascular;  Laterality: Bilateral;   Family History:  Family History  Problem Relation Age of Onset  . Hypertension Mother   . Hyperlipidemia Mother   . Deep vein thrombosis Mother   . Cancer Father   . Alcohol abuse Father   . Hypertension Maternal Grandmother   . Deep vein thrombosis Sister   . Alcohol abuse Sister   . Alcohol abuse Sister    Social History:  Social History   Social History  . Marital Status: Married    Spouse Name: N/A  . Number of Children: N/A  . Years of Education: N/A   Social History Main Topics  . Smoking status: Former Smoker -- 1.00 packs/day for 40 years    Types: Cigarettes    Start date: 11/09/1970  Quit date: 11/08/2012  . Smokeless tobacco: Never Used     Comment: using vabor   . Alcohol Use: No     Comment:   quite i2 yearts   . Drug Use: No  . Sexual Activity: No   Other Topics Concern  . None   Social History Narrative   Additional History:   Assessment:   Musculoskeletal: Strength & Muscle Tone: within normal limits Gait & Station: normal Patient leans: N/A  Psychiatric Specialty Exam: HPI  Review of Systems  Psychiatric/Behavioral: Negative for depression, suicidal ideas, hallucinations, memory loss and substance abuse. The patient is not nervous/anxious and does not have insomnia.         Irritability  All other systems reviewed and are negative.   Blood pressure 124/86, pulse 105, temperature 98.4 F (36.9 C), temperature source Tympanic, height 5\' 7"  (1.702 m), weight 205 lb 6.4 oz (93.169 kg), SpO2 95 %.Body mass index is 32.16 kg/(m^2).  General Appearance: Well Groomed  Eye Contact:  Good  Speech:  Normal Rate  Volume:  Normal  Mood:  Irritable  Affect:  Edwena Felty somewhat able to smile occasionally  Thought Process:  Linear  Orientation:  Full (Time, Place, and Person)  Thought Content:  Negative  Suicidal Thoughts:  No  Homicidal Thoughts:  No  Memory:  Immediate;   Good Recent;   Good Remote;   Good  Judgement:  Good  Insight:  Good  Psychomotor Activity:  Negative  Concentration:  Good  Recall:  Good  Fund of Knowledge: Good  Language: Good  Akathisia:  Negative  Handed:    AIMS (if indicated):  Not done today  Assets:  Communication Skills Desire for Improvement Social Support  ADL's:  Intact  Cognition: WNL  Sleep:  fair   Is the patient at risk to self?  No. Has the patient been a risk to self in the past 6 months?  No. Has the patient been a risk to self within the distant past?  No. Is the patient a risk to others?  No. Has the patient been a risk to others in the past 6 months?  No. Has the patient been a risk to others within the distant past?  No.  Current Medications: Current Outpatient Prescriptions  Medication Sig Dispense Refill  . ARIPiprazole (ABILIFY) 2 MG tablet Take 1 tablet (2 mg total) by mouth every morning. 30 tablet 1  . aspirin EC 81 MG tablet Take 81 mg by mouth daily.    [name] atorvastatin (LIPITOR) 10 MG tablet Take 10 mg by mouth daily.    . bisoprolol (ZEBETA) 5 MG tablet Take 2.5 mg by mouth daily.    . celecoxib (CELEBREX) 200 MG capsule TAKE 1 CAPSULE(S) BY MOUTH DAILY FOR PAIN  2  . cetirizine (ZYRTEC) 10 MG tablet Take 10 mg by mouth daily.    . Cholecalciferol (VITAMIN D-3) 5000 UNITS TABS Take 5,000  Units by mouth daily.     . clopidogrel (PLAVIX) 75 MG tablet Take 75 mg by mouth daily with breakfast.     . fluconazole (DIFLUCAN) 150 MG tablet TAKE 1 TABLET BY MOUTH ONCE FOR YEAST INFECTION. MAY REPEAT IN 3 DAYS FOR PERSISTENT SYMPTOMS  1  . FLUoxetine (PROZAC) 20 MG capsule Take 3 capsules (60 mg total) by mouth daily. 90 capsule 4  . FLUoxetine (PROZAC) 40 MG capsule Take 60 mg by mouth daily.     [name] gabapentin (NEURONTIN) 300 MG capsule Take 600 mg by mouth  2 (two) times daily.    . hydrochlorothiazide (HYDRODIURIL) 12.5 MG tablet TAKE 1 TABLET BY MOUTH DAILY FOR BLOOD PRESSURE/EDEMA  1  . insulin aspart (NOVOLOG) 100 UNIT/ML injection Sliding scale    . Insulin Degludec (TRESIBA FLEXTOUCH [place]) Inject into the skin.    . nicotine (NICODERM CQ - DOSED IN MG/24 HOURS) 21 mg/24hr patch Place 1 patch onto the skin daily.    [name] NOVOFINE 32G X 6 MM MISC     . ondansetron (ZOFRAN) 8 MG tablet Take 8 mg by mouth 2 (two) times daily as needed for nausea.     Glory Rosebush DELICA LANCETS FINE MISC CHECK BLOOD SUGAR THREE TIMES A DAY  5  . ONETOUCH VERIO test strip CHECK BLOOD SUGAR THREE TIMES A DAY  5  . pantoprazole (PROTONIX) 40 MG tablet Take 40 mg by mouth 2 (two) times daily.     [name] tiZANidine (ZANAFLEX) 4 MG tablet TAKE 1 TABLET BY MOUTH TWICE A DAY AS NEEDED FOR MUSCLE PAIN AND SPASMS  2  . traMADol (ULTRAM) 50 MG tablet Take 2 tablets (100 mg total) by mouth 2 (two) times daily. 30 tablet 0  . TRESIBA FLEXTOUCH 100 UNIT/ML SOPN INJECT 30 UNITS EVERY DAY FOR DIABETES  3  . VICTOZA 18 MG/3ML SOPN 1.2 mg daily.     . clonazePAM (KLONOPIN) 0.5 MG tablet Take 1 tablet (0.5 mg total) by mouth 2 (two) times daily as needed for anxiety. 60 tablet 1   No current facility-administered medications for this visit.    Medical Decision Making:  Established Problem, Worsening (2), Review of Medication Regimen & Side Effects (2) and Review of New Medication or Change in Dosage (2)  Treatment Plan  Summary:Medication management and Plan   Patient's symptoms are consistent with major depressive disorder, moderate recurrent. We will continue her Prozac at 60 mg daily. We will continue augmenting with Abilify 2 mg in the morning. Given that she may be describing some akathisia within the prescribe some clonazepam 0.5 mg twice a day. Risk and benefits of been discussed and patient is able to consent.  She will continue on her alprazolam 0.5 mg 3 times a day, she has a prior prescription from her primary care and thus I told her to avoid using this while we give a trial of the clonazepam. Ideally the clonazepam may be at a cover her it and ultimately the alprazolam to need to be discontinued or just used on an as-needed basis for intense anxiety. Patient is aware of this and states she is going to refrain from using the alprazolam regularly while she is having her trial of clonazepam. Should the trial of clonazepam not work well we will return to writing prescriptions for the alprazolam 0.5 mg 3 times a day.   Patient will follow up in 1 month. She's been encouraged colony questions concerns prior to her next appointment.   Faith Rogue 01/22/2015, 10:20 AM

## 2015-01-31 ENCOUNTER — Ambulatory Visit: Payer: Medicare Other | Admitting: Licensed Clinical Social Worker

## 2015-02-01 ENCOUNTER — Other Ambulatory Visit: Payer: Self-pay | Admitting: Family Medicine

## 2015-02-18 DIAGNOSIS — N189 Chronic kidney disease, unspecified: Secondary | ICD-10-CM

## 2015-02-18 HISTORY — DX: Chronic kidney disease, unspecified: N18.9

## 2015-02-22 ENCOUNTER — Encounter: Payer: Self-pay | Admitting: Psychiatry

## 2015-02-22 ENCOUNTER — Ambulatory Visit (INDEPENDENT_AMBULATORY_CARE_PROVIDER_SITE_OTHER): Payer: Medicare Other | Admitting: Psychiatry

## 2015-02-22 VITALS — BP 122/80 | HR 96 | Temp 97.7°F | Ht 67.0 in | Wt 203.6 lb

## 2015-02-22 DIAGNOSIS — F331 Major depressive disorder, recurrent, moderate: Secondary | ICD-10-CM

## 2015-02-22 MED ORDER — ARIPIPRAZOLE 5 MG PO TABS: 5.0000 mg | ORAL_TABLET | ORAL | Status: DC

## 2015-02-22 MED ORDER — CLONAZEPAM 0.5 MG PO TABS: 0.5000 mg | ORAL_TABLET | Freq: Two times a day (BID) | ORAL | Status: DC | PRN

## 2015-02-22 NOTE — Progress Notes (Signed)
[place] MD/PA/NP OP Progress Note  02/22/2015 11:20 AM Morgan Daniels  MRN:  [id number]  Subjective:  Patient returns for follow-up of her major depressive disorder, moderate, recurrent. She states she still feels depressed. At the last visit we started some clonazepam to help with anxiety, irritability and perhaps some akathisia related to Abilify. She indicates she that all those things have improved with the clonazepam but she might be more tired. We spent some time reviewing sleep hygiene. The patient states she avoids naps however she mentioned that her husband sometimes checks on her to make sure she is still breathing while she sleeps. I did discuss that perhaps she might want to get a sleep study to assess whether she has some type of sleep apnea. Patient stated other provider already recommended this but she did not want to go.  We discussed that we could try to increase the Abilify a little bit more for augmentation but that if this is not successful she might want to consider starting another antidepressant and she is already on a high dose of Prozac. She is using the clonazepam exclusively with the exception that she states she did use her alprazolam about 5 times since her last visit. I indicated we do not typically prescribed both and thus at this point we'll just continue on the clonazepam only.  Chief Complaint: Irritability Chief Complaint    Follow-up; Medication Refill     Visit Diagnosis:     ICD-9-CM ICD-10-CM   1. Major depressive disorder, recurrent episode, moderate (HCC) 296.32 F33.1     Past Medical History:  Past Medical History  Diagnosis Date  . IBS (irritable bowel syndrome)   . Plantar fasciitis   . Fatty liver   . Diabetes ([place])   . Carotid artery occlusion   . Hypertension   . Anxiety   . Depression   . GERD (gastroesophageal reflux disease)   . H/O hiatal hernia   . Fibromyalgia   . Stroke [facility]) Dec. 14,2013    Right side-ministroke    Past Surgical  History  Procedure Laterality Date  . Back surgery  2000, 2004  . Hernia repair    . Abdominal hysterectomy  1990  . Cholecystectomy  2001  . Endarterectomy Left 05/06/2012    Procedure: ENDARTERECTOMY CAROTID;  Surgeon: Angelia Mould, MD;  Location: [place];  Service: Vascular;  Laterality: Left;  . Patch angioplasty Left 05/06/2012    Procedure: WITH DACRON PATCH ANGIOPLASTY ;  Surgeon: Angelia Mould, MD;  Location: [place];  Service: Vascular;  Laterality: Left;  . Carotid endarterectomy Left 05/06/12  . Spine surgery  2004  . Endarterectomy Right 08/09/2013    Procedure: ENDARTERECTOMY CAROTID-RIGHT;  Surgeon: Angelia Mould, MD;  Location: [place];  Service: Vascular;  Laterality: Right;  . Cerebral angiogram Bilateral 05/03/2012    Procedure: CEREBRAL ANGIOGRAM;  Surgeon: Angelia Mould, MD;  Location: [facility] CATH LAB;  Service: Cardiovascular;  Laterality: Bilateral;   Family History:  Family History  Problem Relation Age of Onset  . Hypertension Mother   . Hyperlipidemia Mother   . Deep vein thrombosis Mother   . Cancer Father   . Alcohol abuse Father   . Hypertension Maternal Grandmother   . Deep vein thrombosis Sister   . Alcohol abuse Sister   . Alcohol abuse Sister    Social History:  Social History   Social History  . Marital Status: Married    Spouse Name: N/A  . Number  of Children: N/A  . Years of Education: N/A   Social History Main Topics  . Smoking status: Former Smoker -- 1.00 packs/day for 40 years    Types: Cigarettes    Start date: 11/09/1970    Quit date: 11/08/2012  . Smokeless tobacco: Never Used     Comment: using vabor   . Alcohol Use: No     Comment:   quite i2 yearts   . Drug Use: No  . Sexual Activity: No   Other Topics Concern  . None   Social History Narrative   Additional History:   Assessment:   Musculoskeletal: Strength & Muscle Tone: within normal limits Gait & Station: normal Patient leans:  N/A  Psychiatric Specialty Exam: HPI  Review of Systems  Psychiatric/Behavioral: Negative for depression, suicidal ideas, hallucinations, memory loss and substance abuse. The patient is not nervous/anxious and does not have insomnia.        Irritability  All other systems reviewed and are negative.   Blood pressure 122/80, pulse 96, temperature 97.7 F (36.5 C), temperature source Tympanic, height 5\' 7"  (1.702 m), weight 203 lb 9.6 oz (92.352 kg), SpO2 92 %.Body mass index is 31.88 kg/(m^2).  General Appearance: Well Groomed  Eye Contact:  Good  Speech:  Normal Rate  Volume:  Normal  Mood:  Depressed  Affect:  Constricted, but conversant and easy to engaging   Thought Process:  Linear  Orientation:  Full (Time, Place, and Person)  Thought Content:  Negative  Suicidal Thoughts:  No  Homicidal Thoughts:  No  Memory:  Immediate;   Good Recent;   Good Remote;   Good  Judgement:  Good  Insight:  Good  Psychomotor Activity:  Negative  Concentration:  Good  Recall:  Good  Fund of Knowledge: Good  Language: Good  Akathisia:  Negative  Handed:    AIMS (if indicated):  Not done today  Assets:  Communication Skills Desire for Improvement Social Support  ADL's:  Intact  Cognition: WNL  Sleep:  fair   Is the patient at risk to self?  No. Has the patient been a risk to self in the past 6 months?  No. Has the patient been a risk to self within the distant past?  No. Is the patient a risk to others?  No. Has the patient been a risk to others in the past 6 months?  No. Has the patient been a risk to others within the distant past?  No.  Current Medications: Current Outpatient Prescriptions  Medication Sig Dispense Refill  . ARIPiprazole (ABILIFY) 5 MG tablet Take 1 tablet (5 mg total) by mouth every morning. 30 tablet 4  . aspirin EC 81 MG tablet Take 81 mg by mouth daily.    [name] atorvastatin (LIPITOR) 10 MG tablet Take 10 mg by mouth daily.    . bisoprolol (ZEBETA) 5 MG tablet  Take 2.5 mg by mouth daily.    . celecoxib (CELEBREX) 200 MG capsule TAKE 1 CAPSULE(S) BY MOUTH DAILY FOR PAIN  2  . cetirizine (ZYRTEC) 10 MG tablet Take 10 mg by mouth daily.    . Cholecalciferol (VITAMIN D-3) 5000 UNITS TABS Take 5,000 Units by mouth daily.     . clonazePAM (KLONOPIN) 0.5 MG tablet Take 1 tablet (0.5 mg total) by mouth 2 (two) times daily as needed for anxiety. 60 tablet 4  . clopidogrel (PLAVIX) 75 MG tablet Take 75 mg by mouth daily with breakfast.     . fluconazole (DIFLUCAN)  150 MG tablet TAKE 1 TABLET BY MOUTH ONCE FOR YEAST INFECTION. MAY REPEAT IN 3 DAYS FOR PERSISTENT SYMPTOMS  1  . FLUoxetine (PROZAC) 20 MG capsule Take 3 capsules (60 mg total) by mouth daily. 90 capsule 4  . FLUoxetine (PROZAC) 40 MG capsule Take 60 mg by mouth daily.     [name] gabapentin (NEURONTIN) 300 MG capsule Take 600 mg by mouth 2 (two) times daily.    [name] glimepiride (AMARYL) 4 MG tablet TAKE 1 TABLET BY MOUTH 2 TIMES A DAY FOR DIABETES  3  . hydrochlorothiazide (HYDRODIURIL) 12.5 MG tablet TAKE 1 TABLET BY MOUTH DAILY FOR BLOOD PRESSURE/EDEMA  1  . insulin aspart (NOVOLOG) 100 UNIT/ML injection Sliding scale    . Insulin Degludec (TRESIBA FLEXTOUCH [place]) Inject into the skin.    . nicotine (NICODERM CQ - DOSED IN MG/24 HOURS) 21 mg/24hr patch Place 1 patch onto the skin daily.    [name] NOVOFINE 32G X 6 MM MISC     . NOVOLOG FLEXPEN 100 UNIT/ML FlexPen INJECT 20 UNITS SUBCUTANEOUSLY TWICE A DAY AND 15 UNITS ONCE A DAY PRIOR TO MEALS FOR DIABETES  3  . ondansetron (ZOFRAN) 8 MG tablet Take 8 mg by mouth 2 (two) times daily as needed for nausea.     Glory Rosebush DELICA LANCETS FINE MISC CHECK BLOOD SUGAR THREE TIMES A DAY  5  . ONETOUCH VERIO test strip CHECK BLOOD SUGAR THREE TIMES A DAY  5  . pantoprazole (PROTONIX) 40 MG tablet Take 40 mg by mouth 2 (two) times daily.     [name] tiZANidine (ZANAFLEX) 4 MG tablet TAKE 1 TABLET BY MOUTH TWICE A DAY AS NEEDED FOR MUSCLE PAIN AND SPASMS  2  . traMADol (ULTRAM)  50 MG tablet Take 2 tablets (100 mg total) by mouth 2 (two) times daily. 30 tablet 0  . TRESIBA FLEXTOUCH 100 UNIT/ML SOPN INJECT 30 UNITS EVERY DAY FOR DIABETES  3  . VICTOZA 18 MG/3ML SOPN 1.2 mg daily.      No current facility-administered medications for this visit.    Medical Decision Making:  Established Problem, Worsening (2), Review of Medication Regimen & Side Effects (2) and Review of New Medication or Change in Dosage (2)  Treatment Plan Summary:Medication management and Plan   Patient's symptoms are consistent with major depressive disorder, moderate recurrent. We will continue her Prozac at 60 mg daily. We will increase the Abilify from 2 mg in the morning to 5 mg in the morning. Continue clonazepam 0.5 mg twice a day as needed. Should this augmentation not improve it might be appropriate for the patient to start on a new antidepressant  Patient will follow up in 1 month. Aware of my departure and that she will be able to continue with another provider within this clinic. She's been encouraged call questions concerns prior to her next appointment.   Faith Rogue 02/22/2015, 11:20 AM

## 2015-03-08 DIAGNOSIS — F331 Major depressive disorder, recurrent, moderate: Secondary | ICD-10-CM | POA: Diagnosis not present

## 2015-03-08 DIAGNOSIS — E1165 Type 2 diabetes mellitus with hyperglycemia: Secondary | ICD-10-CM | POA: Diagnosis not present

## 2015-03-08 DIAGNOSIS — K580 Irritable bowel syndrome with diarrhea: Secondary | ICD-10-CM | POA: Diagnosis not present

## 2015-03-08 DIAGNOSIS — M150 Primary generalized (osteo)arthritis: Secondary | ICD-10-CM | POA: Diagnosis not present

## 2015-03-08 DIAGNOSIS — I679 Cerebrovascular disease, unspecified: Secondary | ICD-10-CM | POA: Diagnosis not present

## 2015-03-08 DIAGNOSIS — I10 Essential (primary) hypertension: Secondary | ICD-10-CM | POA: Diagnosis not present

## 2015-03-08 DIAGNOSIS — F17210 Nicotine dependence, cigarettes, uncomplicated: Secondary | ICD-10-CM | POA: Diagnosis not present

## 2015-03-26 ENCOUNTER — Encounter: Payer: Self-pay | Admitting: Psychiatry

## 2015-03-26 ENCOUNTER — Ambulatory Visit (INDEPENDENT_AMBULATORY_CARE_PROVIDER_SITE_OTHER): Payer: Medicare Other | Admitting: Psychiatry

## 2015-03-26 VITALS — BP 132/80 | HR 100 | Temp 97.8°F | Ht 67.0 in | Wt 208.6 lb

## 2015-03-26 DIAGNOSIS — F331 Major depressive disorder, recurrent, moderate: Secondary | ICD-10-CM

## 2015-03-26 NOTE — Progress Notes (Signed)
BH MD/PA/NP OP Progress Note  03/26/2015 11:04 AM Shinequa Bayona  MRN:  [id number]  Subjective:  Patient returns for follow-up of her major depressive disorder, moderate, recurrent. At the last visit we have continued her fluoxetine 60 mg and increased her Abilify from 2 mg to 5 mg for augmentation purposes. She presents today feeling like her mood is stable and perhaps somewhat better. She states that over the past week she's probably had 4 or 5 days that were good. She does attribute some of her improvement in mood to the fact that she has received a new GI medication to combat diarrhea from her irritable bowel. She states that this is now under better control she is more able to go out and be active and feels this is also improved her mood.  I discussed that should she continue to respond to the current regimen that changing her antidepressant might be a reasonable option as she states she's only been on citalopram and Wellbutrin in the past. However she clarifies she's probably been on several other antidepressants but she never stayed on them long enough. Chief Complaint: Doing okay Chief Complaint    Follow-up; Medication Refill     Visit Diagnosis:     ICD-9-CM ICD-10-CM   1. Major depressive disorder, recurrent episode, moderate (HCC) 296.32 F33.1     Past Medical History:  Past Medical History  Diagnosis Date  . IBS (irritable bowel syndrome)   . Plantar fasciitis   . Fatty liver   . Diabetes ([place])   . Carotid artery occlusion   . Hypertension   . Anxiety   . Depression   . GERD (gastroesophageal reflux disease)   . H/O hiatal hernia   . Fibromyalgia   . Stroke [facility]) Dec. 14,2013    Right side-ministroke    Past Surgical History  Procedure Laterality Date  . Back surgery  2000, 2004  . Hernia repair    . Abdominal hysterectomy  1990  . Cholecystectomy  2001  . Endarterectomy Left 05/06/2012    Procedure: ENDARTERECTOMY CAROTID;  Surgeon: Angelia Mould, MD;   Location: [place];  Service: Vascular;  Laterality: Left;  . Patch angioplasty Left 05/06/2012    Procedure: WITH DACRON PATCH ANGIOPLASTY ;  Surgeon: Angelia Mould, MD;  Location: [place];  Service: Vascular;  Laterality: Left;  . Carotid endarterectomy Left 05/06/12  . Spine surgery  2004  . Endarterectomy Right 08/09/2013    Procedure: ENDARTERECTOMY CAROTID-RIGHT;  Surgeon: Angelia Mould, MD;  Location: [place];  Service: Vascular;  Laterality: Right;  . Cerebral angiogram Bilateral 05/03/2012    Procedure: CEREBRAL ANGIOGRAM;  Surgeon: Angelia Mould, MD;  Location: [facility] CATH LAB;  Service: Cardiovascular;  Laterality: Bilateral;   Family History:  Family History  Problem Relation Age of Onset  . Hypertension Mother   . Hyperlipidemia Mother   . Deep vein thrombosis Mother   . Cancer Father   . Alcohol abuse Father   . Hypertension Maternal Grandmother   . Deep vein thrombosis Sister   . Alcohol abuse Sister   . Alcohol abuse Sister    Social History:  Social History   Social History  . Marital Status: Married    Spouse Name: N/A  . Number of Children: N/A  . Years of Education: N/A   Social History Main Topics  . Smoking status: Former Smoker -- 1.00 packs/day for 40 years    Types: Cigarettes    Start date: 11/09/1970  Quit date: 11/08/2012  . Smokeless tobacco: Never Used     Comment: using vabor   . Alcohol Use: No     Comment:   quite i2 yearts   . Drug Use: No  . Sexual Activity: No   Other Topics Concern  . None   Social History Narrative   Additional History:   Assessment:   Musculoskeletal: Strength & Muscle Tone: within normal limits Gait & Station: normal Patient leans: N/A  Psychiatric Specialty Exam: HPI  Review of Systems  Psychiatric/Behavioral: Positive for depression (States that it's still there but is improved where she is now having several good days as noted above.). Negative for suicidal ideas, hallucinations, memory  loss and substance abuse. The patient is not nervous/anxious and does not have insomnia.   All other systems reviewed and are negative.   Blood pressure 132/80, pulse 100, temperature 97.8 F (36.6 C), temperature source Tympanic, height 5\' 7"  (1.702 m), weight 208 lb 9.6 oz (94.62 kg), SpO2 93 %.Body mass index is 32.66 kg/(m^2).  General Appearance: Well Groomed  Eye Contact:  Good  Speech:  Normal Rate  Volume:  Normal  Mood:  Doing okay  Affect:  slightly brighter and more engaged than last visit,  Thought Process:  Linear  Orientation:  Full (Time, Place, and Person)  Thought Content:  Negative  Suicidal Thoughts:  No  Homicidal Thoughts:  No  Memory:  Immediate;   Good Recent;   Good Remote;   Good  Judgement:  Good  Insight:  Good  Psychomotor Activity:  Negative  Concentration:  Good  Recall:  Good  Fund of Knowledge: Good  Language: Good  Akathisia:  Negative  Handed:    AIMS (if indicated):  Not done today  Assets:  Communication Skills Desire for Improvement Social Support  ADL's:  Intact  Cognition: WNL  Sleep:  fair   Is the patient at risk to self?  No. Has the patient been a risk to self in the past 6 months?  No. Has the patient been a risk to self within the distant past?  No. Is the patient a risk to others?  No. Has the patient been a risk to others in the past 6 months?  No. Has the patient been a risk to others within the distant past?  No.  Current Medications: Current Outpatient Prescriptions  Medication Sig Dispense Refill  . ARIPiprazole (ABILIFY) 5 MG tablet Take 1 tablet (5 mg total) by mouth every morning. 30 tablet 4  . aspirin EC 81 MG tablet Take 81 mg by mouth daily.    [name] atorvastatin (LIPITOR) 10 MG tablet Take 10 mg by mouth daily.    . bisoprolol (ZEBETA) 5 MG tablet Take 2.5 mg by mouth daily.    . celecoxib (CELEBREX) 200 MG capsule TAKE 1 CAPSULE(S) BY MOUTH DAILY FOR PAIN  2  . cetirizine (ZYRTEC) 10 MG tablet Take 10 mg by  mouth daily.    . Cholecalciferol (VITAMIN D-3) 5000 UNITS TABS Take 5,000 Units by mouth daily.     . clonazePAM (KLONOPIN) 0.5 MG tablet Take 1 tablet (0.5 mg total) by mouth 2 (two) times daily as needed for anxiety. 60 tablet 4  . fluconazole (DIFLUCAN) 150 MG tablet TAKE 1 TABLET BY MOUTH ONCE FOR YEAST INFECTION. MAY REPEAT IN 3 DAYS FOR PERSISTENT SYMPTOMS  1  . FLUoxetine (PROZAC) 20 MG capsule Take 3 capsules (60 mg total) by mouth daily. 90 capsule 4  . FLUoxetine (  PROZAC) 40 MG capsule Take 60 mg by mouth daily.     [name] gabapentin (NEURONTIN) 300 MG capsule Take 600 mg by mouth 2 (two) times daily.    [name] glimepiride (AMARYL) 4 MG tablet TAKE 1 TABLET BY MOUTH 2 TIMES A DAY FOR DIABETES  3  . hydrochlorothiazide (HYDRODIURIL) 12.5 MG tablet TAKE 1 TABLET BY MOUTH DAILY FOR BLOOD PRESSURE/EDEMA  1  . insulin aspart (NOVOLOG) 100 UNIT/ML injection Sliding scale    . Insulin Degludec (TRESIBA FLEXTOUCH [place]) Inject into the skin.    [name] NOVOFINE 32G X 6 MM MISC     . NOVOLOG FLEXPEN 100 UNIT/ML FlexPen INJECT 20 UNITS SUBCUTANEOUSLY TWICE A DAY AND 15 UNITS ONCE A DAY PRIOR TO MEALS FOR DIABETES  3  . ondansetron (ZOFRAN) 8 MG tablet Take 8 mg by mouth 2 (two) times daily as needed for nausea.     Glory Rosebush DELICA LANCETS FINE MISC CHECK BLOOD SUGAR THREE TIMES A DAY  5  . ONETOUCH VERIO test strip CHECK BLOOD SUGAR THREE TIMES A DAY  5  . tiZANidine (ZANAFLEX) 4 MG tablet TAKE 1 TABLET BY MOUTH TWICE A DAY AS NEEDED FOR MUSCLE PAIN AND SPASMS  2  . traMADol (ULTRAM) 50 MG tablet Take 2 tablets (100 mg total) by mouth 2 (two) times daily. 30 tablet 0  . TRESIBA FLEXTOUCH 100 UNIT/ML SOPN INJECT 30 UNITS EVERY DAY FOR DIABETES  3  . VICTOZA 18 MG/3ML SOPN 1.2 mg daily.      No current facility-administered medications for this visit.    Medical Decision Making:  Established Problem, Worsening (2), Review of Medication Regimen & Side Effects (2) and Review of New Medication or Change in Dosage  (2)  Treatment Plan Summary:Medication management and Plan   Patient's symptoms are consistent with major depressive disorder, moderate recurrent. We will continue her Prozac at 60 mg daily and Abilify 5 mg in the morning. Continue clonazepam 0.5 mg twice a day as needed. Should this augmentation not improve it might be appropriate for the patient to start on a new antidepressant  Patient will follow up in 3 months. She is aware of my departure and is to follow-up with a new provider in this clinic.. She's been encouraged call questions concerns prior to her next appointment.  He also inquired about pursuing a therapist and was open to therapist in the area. I did provide her with a list of therapists. She relates she wants to have therapy to deal with issues of her past which continue to affect her.   Faith Rogue 03/26/2015, 11:04 AM

## 2015-04-12 DIAGNOSIS — M25532 Pain in left wrist: Secondary | ICD-10-CM | POA: Diagnosis not present

## 2015-06-05 DIAGNOSIS — F17210 Nicotine dependence, cigarettes, uncomplicated: Secondary | ICD-10-CM | POA: Diagnosis not present

## 2015-06-05 DIAGNOSIS — I679 Cerebrovascular disease, unspecified: Secondary | ICD-10-CM | POA: Diagnosis not present

## 2015-06-05 DIAGNOSIS — I10 Essential (primary) hypertension: Secondary | ICD-10-CM | POA: Diagnosis not present

## 2015-06-05 DIAGNOSIS — G5602 Carpal tunnel syndrome, left upper limb: Secondary | ICD-10-CM | POA: Diagnosis not present

## 2015-06-05 DIAGNOSIS — F331 Major depressive disorder, recurrent, moderate: Secondary | ICD-10-CM | POA: Diagnosis not present

## 2015-06-05 DIAGNOSIS — E1140 Type 2 diabetes mellitus with diabetic neuropathy, unspecified: Secondary | ICD-10-CM | POA: Diagnosis not present

## 2015-06-07 DIAGNOSIS — G5602 Carpal tunnel syndrome, left upper limb: Secondary | ICD-10-CM | POA: Diagnosis not present

## 2015-06-18 DIAGNOSIS — N189 Chronic kidney disease, unspecified: Secondary | ICD-10-CM

## 2015-06-18 HISTORY — DX: Chronic kidney disease, unspecified: N18.9

## 2015-06-20 ENCOUNTER — Ambulatory Visit: Payer: Medicare Other | Admitting: Psychiatry

## 2015-06-26 DIAGNOSIS — N390 Urinary tract infection, site not specified: Secondary | ICD-10-CM | POA: Diagnosis not present

## 2015-06-27 ENCOUNTER — Encounter: Payer: Self-pay | Admitting: Psychiatry

## 2015-06-27 ENCOUNTER — Ambulatory Visit (INDEPENDENT_AMBULATORY_CARE_PROVIDER_SITE_OTHER): Payer: Medicare Other | Admitting: Psychiatry

## 2015-06-27 VITALS — BP 122/84 | HR 103 | Temp 97.2°F | Ht 67.0 in | Wt 204.6 lb

## 2015-06-27 DIAGNOSIS — F331 Major depressive disorder, recurrent, moderate: Secondary | ICD-10-CM

## 2015-06-27 MED ORDER — FLUOXETINE HCL 20 MG PO CAPS: 60.0000 mg | ORAL_CAPSULE | Freq: Every day | ORAL | Status: DC

## 2015-06-27 MED ORDER — ARIPIPRAZOLE 5 MG PO TABS: 5.0000 mg | ORAL_TABLET | ORAL | Status: DC

## 2015-06-27 NOTE — Progress Notes (Signed)
Patient ID: Morgan Daniels, female   DOB: May 10, 1960, 55 y.o.   MRN: [id number] [facility] MD/PA/NP OP Progress Note  06/27/2015 10:28 AM Morgan Daniels  MRN:  [id number]  Subjective:  Patient returns for follow-up of her major depressive disorder, moderate, recurrent. Patient was previously seen by Dr. Jimmye Norman and this is the first visit for this patient with this clinician. Today she reports that she has been doing okay. Compliant with her medications. Denies any side effects. States that for a while her Abilify was filled at 2 mg but she got it recently changed to 5 mg. Feels like it has been helping with her mood. However she reports that she's never been a very up person and down she continues to feel overwhelmed with her household chores. She has 2 grown children are doing quite well and she doesn't get to see as much of them. States that she also indulges in some binging because she is lonely in the evenings. Her hemoglobin A1c has been elevated and she is joined a gym to try to get it under control. She denied any suicidal thoughts. Agrees that she needs to work on her diabetes and exercise more .  Chief Complaint: Doing allright  Visit Diagnosis:     ICD-9-CM ICD-10-CM   1. Major depressive disorder, recurrent episode, moderate (HCC) 296.32 F33.1     Past Medical History:  Past Medical History  Diagnosis Date  . IBS (irritable bowel syndrome)   . Plantar fasciitis   . Fatty liver   . Diabetes ([place])   . Carotid artery occlusion   . Hypertension   . Anxiety   . Depression   . GERD (gastroesophageal reflux disease)   . H/O hiatal hernia   . Fibromyalgia   . Stroke [facility]) Dec. 14,2013    Right side-ministroke    Past Surgical History  Procedure Laterality Date  . Back surgery  2000, 2004  . Hernia repair    . Abdominal hysterectomy  1990  . Cholecystectomy  2001  . Endarterectomy Left 05/06/2012    Procedure: ENDARTERECTOMY CAROTID;  Surgeon: Angelia Mould, MD;   Location: [place];  Service: Vascular;  Laterality: Left;  . Patch angioplasty Left 05/06/2012    Procedure: WITH DACRON PATCH ANGIOPLASTY ;  Surgeon: Angelia Mould, MD;  Location: [place];  Service: Vascular;  Laterality: Left;  . Carotid endarterectomy Left 05/06/12  . Spine surgery  2004  . Endarterectomy Right 08/09/2013    Procedure: ENDARTERECTOMY CAROTID-RIGHT;  Surgeon: Angelia Mould, MD;  Location: [place];  Service: Vascular;  Laterality: Right;  . Cerebral angiogram Bilateral 05/03/2012    Procedure: CEREBRAL ANGIOGRAM;  Surgeon: Angelia Mould, MD;  Location: [facility] CATH LAB;  Service: Cardiovascular;  Laterality: Bilateral;   Family History:  Family History  Problem Relation Age of Onset  . Hypertension Mother   . Hyperlipidemia Mother   . Deep vein thrombosis Mother   . Cancer Father   . Alcohol abuse Father   . Hypertension Maternal Grandmother   . Deep vein thrombosis Sister   . Alcohol abuse Sister   . Alcohol abuse Sister    Social History:  Social History   Social History  . Marital Status: Married    Spouse Name: N/A  . Number of Children: N/A  . Years of Education: N/A   Social History Main Topics  . Smoking status: Former Smoker -- 1.00 packs/day for 40 years    Types: Cigarettes  Start date: 11/09/1970    Quit date: 11/08/2012  . Smokeless tobacco: Never Used     Comment: using vabor   . Alcohol Use: No     Comment:   quite i2 yearts   . Drug Use: No  . Sexual Activity: No   Other Topics Concern  . Not on file   Social History Narrative   Additional History:   Assessment:   Musculoskeletal: Strength & Muscle Tone: within normal limits Gait & Station: normal Patient leans: N/A  Psychiatric Specialty Exam: HPI  Review of Systems  Psychiatric/Behavioral: Positive for depression (States that it's still there but is improved where she is now having several good days as noted above.). Negative for suicidal ideas, hallucinations,  memory loss and substance abuse. The patient is not nervous/anxious and does not have insomnia.   All other systems reviewed and are negative.   There were no vitals taken for this visit.There is no weight on file to calculate BMI.  General Appearance: Well Groomed  Eye Contact:  Good  Speech:  Normal Rate  Volume:  Normal  Mood:  Doing okay  Affect:  Congruent with mood   Thought Process:  Linear  Orientation:  Full (Time, Place, and Person)  Thought Content:  Negative  Suicidal Thoughts:  No  Homicidal Thoughts:  No  Memory:  Immediate;   Good Recent;   Good Remote;   Good  Judgement:  Good  Insight:  Good  Psychomotor Activity:  Negative  Concentration:  Good  Recall:  Good  Fund of Knowledge: Good  Language: Good  Akathisia:  Negative  Handed:    AIMS (if indicated):  Not done today  Assets:  Communication Skills Desire for Improvement Social Support  ADL's:  Intact  Cognition: WNL  Sleep:  fair   Is the patient at risk to self?  No. Has the patient been a risk to self in the past 6 months?  No. Has the patient been a risk to self within the distant past?  No. Is the patient a risk to others?  No. Has the patient been a risk to others in the past 6 months?  No. Has the patient been a risk to others within the distant past?  No.  Current Medications: Current Outpatient Prescriptions  Medication Sig Dispense Refill  . ARIPiprazole (ABILIFY) 5 MG tablet Take 1 tablet (5 mg total) by mouth every morning. 30 tablet 4  . aspirin EC 81 MG tablet Take 81 mg by mouth daily.    [name] atorvastatin (LIPITOR) 10 MG tablet Take 10 mg by mouth daily.    . bisoprolol (ZEBETA) 5 MG tablet Take 2.5 mg by mouth daily.    . celecoxib (CELEBREX) 200 MG capsule TAKE 1 CAPSULE(S) BY MOUTH DAILY FOR PAIN  2  . cetirizine (ZYRTEC) 10 MG tablet Take 10 mg by mouth daily.    . Cholecalciferol (VITAMIN D-3) 5000 UNITS TABS Take 5,000 Units by mouth daily.     . clonazePAM (KLONOPIN) 0.5 MG  tablet Take 1 tablet (0.5 mg total) by mouth 2 (two) times daily as needed for anxiety. 60 tablet 4  . fluconazole (DIFLUCAN) 150 MG tablet TAKE 1 TABLET BY MOUTH ONCE FOR YEAST INFECTION. MAY REPEAT IN 3 DAYS FOR PERSISTENT SYMPTOMS  1  . FLUoxetine (PROZAC) 20 MG capsule Take 3 capsules (60 mg total) by mouth daily. 90 capsule 4  . FLUoxetine (PROZAC) 40 MG capsule Take 60 mg by mouth daily.     [name]  gabapentin (NEURONTIN) 300 MG capsule Take 600 mg by mouth 2 (two) times daily.    [name] glimepiride (AMARYL) 4 MG tablet TAKE 1 TABLET BY MOUTH 2 TIMES A DAY FOR DIABETES  3  . hydrochlorothiazide (HYDRODIURIL) 12.5 MG tablet TAKE 1 TABLET BY MOUTH DAILY FOR BLOOD PRESSURE/EDEMA  1  . insulin aspart (NOVOLOG) 100 UNIT/ML injection Sliding scale    . Insulin Degludec (TRESIBA FLEXTOUCH ) Inject into the skin.    [name] NOVOFINE 32G X 6 MM MISC     . NOVOLOG FLEXPEN 100 UNIT/ML FlexPen INJECT 20 UNITS SUBCUTANEOUSLY TWICE A DAY AND 15 UNITS ONCE A DAY PRIOR TO MEALS FOR DIABETES  3  . ondansetron (ZOFRAN) 8 MG tablet Take 8 mg by mouth 2 (two) times daily as needed for nausea.     Glory Rosebush DELICA LANCETS FINE MISC CHECK BLOOD SUGAR THREE TIMES A DAY  5  . ONETOUCH VERIO test strip CHECK BLOOD SUGAR THREE TIMES A DAY  5  . tiZANidine (ZANAFLEX) 4 MG tablet TAKE 1 TABLET BY MOUTH TWICE A DAY AS NEEDED FOR MUSCLE PAIN AND SPASMS  2  . traMADol (ULTRAM) 50 MG tablet Take 2 tablets (100 mg total) by mouth 2 (two) times daily. 30 tablet 0  . TRESIBA FLEXTOUCH 100 UNIT/ML SOPN INJECT 30 UNITS EVERY DAY FOR DIABETES  3  . VICTOZA 18 MG/3ML SOPN 1.2 mg daily.      No current facility-administered medications for this visit.    Medical Decision Making:  Established Problem, Worsening (2), Review of Medication Regimen & Side Effects (2) and Review of New Medication or Change in Dosage (2)  Treatment Plan Summary:Medication management and Plan   Patient's symptoms are consistent with major depressive disorder,  moderate recurrent.   Continue her Prozac at 60 mg daily and Abilify 5 mg in the morning.  Taper clonazepam to 0.5 mg Once daily for 2 weeks and then cut down to 1-2 times per week as needed for anxiety. She has enough medication from her recent refill for the taper.  Diabetes Patient reports that her recent hemoglobin A1c was at the 10. Discussed with patient that.she needs to pay attention to her diet and improve her sugar levels and her hemoglobin A1c reading. Discussed with patient and educated her about the impact her diabetes has on her mood and her motivation to do things.  Patient will follow up in 3 months. She can call with any questions before that.   Morgan Daniels 06/27/2015, 10:28 AM

## 2015-07-02 DIAGNOSIS — G5602 Carpal tunnel syndrome, left upper limb: Secondary | ICD-10-CM | POA: Diagnosis not present

## 2015-07-03 ENCOUNTER — Other Ambulatory Visit: Payer: Self-pay | Admitting: Specialist

## 2015-07-09 ENCOUNTER — Encounter: Payer: Self-pay | Admitting: Cardiology

## 2015-07-10 ENCOUNTER — Encounter
Admission: RE | Admit: 2015-07-10 | Discharge: 2015-07-10 | Disposition: A | Discharge status: Home / Self Care | Payer: Medicare Other | Source: Ambulatory Visit | Attending: Specialist | Admitting: Specialist

## 2015-07-10 DIAGNOSIS — Z01810 Encounter for preprocedural cardiovascular examination: Secondary | ICD-10-CM | POA: Insufficient documentation

## 2015-07-10 DIAGNOSIS — Z01812 Encounter for preprocedural laboratory examination: Secondary | ICD-10-CM | POA: Insufficient documentation

## 2015-07-10 DIAGNOSIS — I10 Essential (primary) hypertension: Secondary | ICD-10-CM | POA: Diagnosis not present

## 2015-07-10 HISTORY — DX: Peptic ulcer, site unspecified, unspecified as acute or chronic, without hemorrhage or perforation: K27.9

## 2015-07-10 HISTORY — DX: Unspecified osteoarthritis, unspecified site: M19.90

## 2015-07-10 HISTORY — DX: Diverticulosis of intestine, part unspecified, without perforation or abscess without bleeding: K57.90

## 2015-07-10 HISTORY — DX: Chronic kidney disease, unspecified: N18.9

## 2015-07-10 LAB — BASIC METABOLIC PANEL
ANION GAP: 11 (ref 5–15)
BUN: 16 mg/dL (ref 6–20)
CHLORIDE: 96 mmol/L — AB (ref 101–111)
CO2: 27 mmol/L (ref 22–32)
Calcium: 9.8 mg/dL (ref 8.9–10.3)
Creatinine, Ser: 0.56 mg/dL (ref 0.44–1.00)
GFR calc non Af Amer: >60 mL/min (ref >60)
Glucose, Bld: 198 mg/dL — ABNORMAL HIGH (ref 65–99)
POTASSIUM: 4.5 mmol/L (ref 3.5–5.1)
SODIUM: 134 mmol/L — AB (ref 135–145)

## 2015-07-10 LAB — CBC
HCT: 46.2 % (ref 35.0–47.0)
HEMOGLOBIN: 15.9 g/dL (ref 12.0–16.0)
MCH: 29.6 pg (ref 26.0–34.0)
MCHC: 34.4 g/dL (ref 32.0–36.0)
MCV: 86 fL (ref 80.0–100.0)
Platelets: 331 10*3/uL (ref 150–440)
RBC: 5.38 MIL/uL — AB (ref 3.80–5.20)
RDW: 13.2 % (ref 11.5–14.5)
WBC: 10.1 10*3/uL (ref 3.6–11.0)

## 2015-07-10 NOTE — Patient Instructions (Addendum)
  Your procedure is scheduled on: Jul 18, 2015 (Wednesday) Report to Same Day Surgery 2nd floor [facility] To find out your arrival time please call [phone] between 1PM - 3PM on Jul 17, 2015 (Tuesday)  Remember: Instructions that are not followed completely may result in serious medical risk, up to and including death, or upon the discretion of your surgeon and anesthesiologist your surgery may need to be rescheduled.    _x___ 1. Do not eat food or drink liquids after midnight. No gum chewing or hard candies.     _ _x 2. No Alcohol for 24 hours before or after surgery.   ____ 3. Bring all medications with you on the day of surgery if instructed.    __x__ 4. Notify your doctor if there is any change in your medical condition     (cold, fever, infections).     Do not wear jewelry, make-up, hairpins, clips or nail polish.  Do not wear lotions, powders, or perfumes. You may wear deodorant.  Do not shave 48 hours prior to surgery. Men may shave face and neck.  Do not bring valuables to the hospital.    [facility] is not responsible for any belongings or valuables.               Contacts, dentures or bridgework may not be worn into surgery.  Leave your suitcase in the car. After surgery it may be brought to your room.  For patients admitted to the hospital, discharge time is determined by your treatment team.   Patients discharged the day of surgery will not be allowed to drive home.    Please read over the following fact sheets that you were given:   [facility] Preparing for Surgery and or MRSA Information   _x___ Take these medicines the morning of surgery with A SIP OF WATER:    1. Gabapentin  2.Klonopin  3.Prilosec (Prilosec at bedtime on May 30)  4.  5.  6.  ____ Fleet Enema (as directed)   _x___ Use CHG Soap or sage wipes as directed on instruction sheet   ____ Use inhalers on the day of surgery and bring to hospital day of surgery  ____ Stop metformin 2 days  prior to surgery    _x___ Take 1/2 of usual insulin dose the night before surgery and none on the morning of surgery          __x__ Stop aspirin or coumadin, or plavix (patient has stopped Aspirin on May 15)  _x__ Stop Anti-inflammatories such as Advil, Aleve, Ibuprofen, Motrin, Naproxen,          Naprosyn, Goodies powders or aspirin products.   ____ Stop supplements until after surgery.    ____ Bring C-Pap to the hospital.

## 2015-07-11 NOTE — Pre-Procedure Instructions (Signed)
CLEARED BY DR Tami Lin WITH UNCONTROLLED DIABETES 07/05/15. PATIENT NONCOMPLIANT. NOTIFIED DR Amie Critchley AND OK TO PROCEED .

## 2015-07-18 ENCOUNTER — Ambulatory Visit: Payer: Medicare Other | Admitting: Anesthesiology

## 2015-07-18 ENCOUNTER — Encounter: Payer: Self-pay | Admitting: *Deleted

## 2015-07-18 ENCOUNTER — Ambulatory Visit
Admission: RE | Admit: 2015-07-18 | Discharge: 2015-07-18 | Disposition: A | Discharge status: Home / Self Care | Payer: Medicare Other | Source: Ambulatory Visit | Attending: Specialist | Admitting: Specialist

## 2015-07-18 ENCOUNTER — Encounter
Admission: RE | Disposition: A | Discharge status: Home / Self Care | Payer: Self-pay | Source: Ambulatory Visit | Attending: Specialist

## 2015-07-18 DIAGNOSIS — I69854 Hemiplegia and hemiparesis following other cerebrovascular disease affecting left non-dominant side: Secondary | ICD-10-CM | POA: Insufficient documentation

## 2015-07-18 DIAGNOSIS — Z794 Long term (current) use of insulin: Secondary | ICD-10-CM | POA: Insufficient documentation

## 2015-07-18 DIAGNOSIS — E119 Type 2 diabetes mellitus without complications: Secondary | ICD-10-CM | POA: Insufficient documentation

## 2015-07-18 DIAGNOSIS — M797 Fibromyalgia: Secondary | ICD-10-CM | POA: Diagnosis not present

## 2015-07-18 DIAGNOSIS — F17210 Nicotine dependence, cigarettes, uncomplicated: Secondary | ICD-10-CM | POA: Diagnosis not present

## 2015-07-18 DIAGNOSIS — Z8711 Personal history of peptic ulcer disease: Secondary | ICD-10-CM | POA: Diagnosis not present

## 2015-07-18 DIAGNOSIS — K449 Diaphragmatic hernia without obstruction or gangrene: Secondary | ICD-10-CM | POA: Diagnosis not present

## 2015-07-18 DIAGNOSIS — I10 Essential (primary) hypertension: Secondary | ICD-10-CM | POA: Diagnosis not present

## 2015-07-18 DIAGNOSIS — M1990 Unspecified osteoarthritis, unspecified site: Secondary | ICD-10-CM | POA: Insufficient documentation

## 2015-07-18 DIAGNOSIS — F329 Major depressive disorder, single episode, unspecified: Secondary | ICD-10-CM | POA: Diagnosis not present

## 2015-07-18 DIAGNOSIS — G5602 Carpal tunnel syndrome, left upper limb: Secondary | ICD-10-CM | POA: Diagnosis not present

## 2015-07-18 DIAGNOSIS — I739 Peripheral vascular disease, unspecified: Secondary | ICD-10-CM | POA: Diagnosis not present

## 2015-07-18 DIAGNOSIS — F419 Anxiety disorder, unspecified: Secondary | ICD-10-CM | POA: Insufficient documentation

## 2015-07-18 DIAGNOSIS — K219 Gastro-esophageal reflux disease without esophagitis: Secondary | ICD-10-CM | POA: Insufficient documentation

## 2015-07-18 HISTORY — PX: CARPAL TUNNEL RELEASE: SHX101

## 2015-07-18 LAB — GLUCOSE, CAPILLARY
Glucose-Capillary: 202 mg/dL — ABNORMAL HIGH (ref 65–99)
Glucose-Capillary: 188 mg/dL — ABNORMAL HIGH (ref 65–99)

## 2015-07-18 SURGERY — CARPAL TUNNEL RELEASE
Anesthesia: General | Laterality: Left

## 2015-07-18 MED ORDER — IPRATROPIUM-ALBUTEROL 0.5-2.5 (3) MG/3ML IN SOLN
3.0000 mL | Freq: Once | RESPIRATORY_TRACT | Status: AC
  Administered 2015-07-18: 3 mL via RESPIRATORY_TRACT

## 2015-07-18 MED ORDER — BUPIVACAINE HCL (PF) 0.5 % IJ SOLN
INTRAMUSCULAR | Status: AC
  Filled 2015-07-18: qty 30

## 2015-07-18 MED ORDER — FENTANYL CITRATE (PF) 100 MCG/2ML IJ SOLN: 25.0000 ug | INTRAMUSCULAR | Status: DC | PRN

## 2015-07-18 MED ORDER — SODIUM CHLORIDE 0.9 % IV SOLN
INTRAVENOUS | Status: DC
Start: 2015-07-18 — End: 2015-07-18
  Administered 2015-07-18: 07:00:00 via INTRAVENOUS

## 2015-07-18 MED ORDER — MELOXICAM 7.5 MG PO TABS
15.0000 mg | ORAL_TABLET | Freq: Once | ORAL | Status: AC
  Administered 2015-07-18: 15 mg via ORAL

## 2015-07-18 MED ORDER — ONDANSETRON HCL 4 MG/2ML IJ SOLN
4.0000 mg | Freq: Once | INTRAMUSCULAR | Status: DC | PRN
Start: 2015-07-18 — End: 2015-07-18

## 2015-07-18 MED ORDER — ONDANSETRON HCL 4 MG/2ML IJ SOLN
INTRAMUSCULAR | Status: DC | PRN
  Administered 2015-07-18: 4 mg via INTRAVENOUS

## 2015-07-18 MED ORDER — KETAMINE HCL 50 MG/ML IJ SOLN
INTRAMUSCULAR | Status: DC | PRN
  Administered 2015-07-18: 25 mg via INTRAVENOUS

## 2015-07-18 MED ORDER — GLYCOPYRROLATE 0.2 MG/ML IJ SOLN
INTRAMUSCULAR | Status: DC | PRN
  Administered 2015-07-18: 0.2 mg via INTRAVENOUS

## 2015-07-18 MED ORDER — IPRATROPIUM-ALBUTEROL 0.5-2.5 (3) MG/3ML IN SOLN
RESPIRATORY_TRACT | Status: AC
  Filled 2015-07-18: qty 3

## 2015-07-18 MED ORDER — LIDOCAINE HCL (CARDIAC) 20 MG/ML IV SOLN
INTRAVENOUS | Status: DC | PRN
  Administered 2015-07-18: 100 mg via INTRAVENOUS

## 2015-07-18 MED ORDER — FENTANYL CITRATE (PF) 100 MCG/2ML IJ SOLN
INTRAMUSCULAR | Status: DC | PRN
  Administered 2015-07-18 (×2): 25 ug via INTRAVENOUS
  Administered 2015-07-18: 50 ug via INTRAVENOUS

## 2015-07-18 MED ORDER — GABAPENTIN 400 MG PO CAPS
400.0000 mg | ORAL_CAPSULE | Freq: Once | ORAL | Status: AC
  Administered 2015-07-18: 400 mg via ORAL

## 2015-07-18 MED ORDER — BUPIVACAINE HCL 0.5 % IJ SOLN
INTRAMUSCULAR | Status: DC | PRN
  Administered 2015-07-18: 15 mL

## 2015-07-18 MED ORDER — HYDROCODONE-ACETAMINOPHEN 5-325 MG PO TABS: 1.0000 | ORAL_TABLET | Freq: Four times a day (QID) | ORAL | Status: DC | PRN

## 2015-07-18 MED ORDER — ALFENTANIL 500 MCG/ML IJ INJ
INJECTION | INTRAMUSCULAR | Status: DC | PRN
  Administered 2015-07-18: 1000 ug via INTRAVENOUS

## 2015-07-18 MED ORDER — CEFAZOLIN SODIUM-DEXTROSE 2-4 GM/100ML-% IV SOLN
2.0000 g | INTRAVENOUS | Status: AC
  Administered 2015-07-18: 2 g via INTRAVENOUS

## 2015-07-18 MED ORDER — PROPOFOL 10 MG/ML IV BOLUS
INTRAVENOUS | Status: DC | PRN
  Administered 2015-07-18: 150 mg via INTRAVENOUS

## 2015-07-18 MED ORDER — DEXAMETHASONE SODIUM PHOSPHATE 4 MG/ML IJ SOLN
INTRAMUSCULAR | Status: DC | PRN
  Administered 2015-07-18: 5 mg via INTRAVENOUS

## 2015-07-18 SURGICAL SUPPLY — 25 items
BLADE SURG MINI STRL (BLADE) ×3 IMPLANT
BNDG ESMARK 4X12 TAN STRL LF (GAUZE/BANDAGES/DRESSINGS) ×3 IMPLANT
CANISTER SUCT 1200ML W/VALVE (MISCELLANEOUS) ×3 IMPLANT
CHLORAPREP W/TINT 26ML (MISCELLANEOUS) ×3 IMPLANT
CUFF TOURN 18 STER (MISCELLANEOUS) ×2 IMPLANT
ELECT REM PT RETURN 9FT ADLT (ELECTROSURGICAL) ×3
ELECTRODE REM PT RTRN 9FT ADLT (ELECTROSURGICAL) ×1 IMPLANT
GAUZE FLUFF 18X24 1PLY STRL (GAUZE/BANDAGES/DRESSINGS) ×3 IMPLANT
GAUZE PETRO XEROFOAM 1X8 (MISCELLANEOUS) ×3 IMPLANT
GAUZE XEROFORM 4X4 STRL (GAUZE/BANDAGES/DRESSINGS) ×2 IMPLANT
GLOVE BIO SURGEON STRL SZ8 (GLOVE) ×3 IMPLANT
GOWN STRL REUS W/ TWL LRG LVL3 (GOWN DISPOSABLE) ×2 IMPLANT
GOWN STRL REUS W/TWL LRG LVL3 (GOWN DISPOSABLE) ×6
KIT RM TURNOVER STRD PROC AR (KITS) ×3 IMPLANT
NS IRRIG 500ML POUR BTL (IV SOLUTION) ×3 IMPLANT
PACK EXTREMITY ARMC (MISCELLANEOUS) ×3 IMPLANT
PAD PREP 24X41 OB/GYN DISP (PERSONAL CARE ITEMS) ×3 IMPLANT
SPLINT CAST 1 STEP 3X12 (MISCELLANEOUS) ×3 IMPLANT
STOCKINETTE 48X4 2 PLY STRL (GAUZE/BANDAGES/DRESSINGS) ×1 IMPLANT
STOCKINETTE BIAS CUT 4 980044 (GAUZE/BANDAGES/DRESSINGS) ×3 IMPLANT
STOCKINETTE STRL 4IN 9604848 (GAUZE/BANDAGES/DRESSINGS) ×3 IMPLANT
SUT ETHILON 4-0 (SUTURE) ×3
SUT ETHILON 4-0 FS2 18XMFL BLK (SUTURE) ×1
SUT ETHILON 5-0 FS-2 18 BLK (SUTURE) ×3 IMPLANT
SUTURE ETHLN 4-0 FS2 18XMF BLK (SUTURE) ×1 IMPLANT

## 2015-07-18 NOTE — Anesthesia Procedure Notes (Signed)
Procedure Name: LMA Insertion Date/Time: 07/18/2015 7:53 AM Performed by: Rosaria Ferries, Marsa Matteo Pre-anesthesia Checklist: Patient identified Patient Re-evaluated:Patient Re-evaluated prior to inductionOxygen Delivery Method: Circle system utilized Preoxygenation: Pre-oxygenation with 100% oxygen Intubation Type: IV induction LMA Size: 4.0 Tube secured with: Tape Dental Injury: Teeth and Oropharynx as per pre-operative assessment

## 2015-07-18 NOTE — Addendum Note (Signed)
Addendum  created 07/18/15 1517 by Alvin Critchley, MD   Modules edited: Anesthesia Events, Narrator   Narrator:  Narrator: Event Log Edited

## 2015-07-18 NOTE — Anesthesia Postprocedure Evaluation (Signed)
Anesthesia Post Note  Patient: Morgan Daniels  Procedure(s) Performed: Procedure(s) (LRB): CARPAL TUNNEL RELEASE (Left)  Patient location during evaluation: PACU Anesthesia Type: General Level of consciousness: awake and alert and oriented Pain management: pain level controlled Vital Signs Assessment: post-procedure vital signs reviewed and stable Respiratory status: spontaneous breathing Cardiovascular status: blood pressure returned to baseline Anesthetic complications: no    Last Vitals:  Filed Vitals:   07/18/15 0956 07/18/15 1000  BP: 133/64 145/76  Pulse: 88 87  Temp: 35.9 C   Resp: 16     Last Pain: There were no vitals filed for this visit.               Tregan Read

## 2015-07-18 NOTE — Progress Notes (Signed)
Fingers warm to left hand  Able to move fingers freely

## 2015-07-18 NOTE — Op Note (Signed)
07/18/2015  8:33 AM  PATIENT:  Morgan Daniels    PRE-OPERATIVE DIAGNOSIS: LEFT CARPAL TUNNEL SYNDROME POST-OPERATIVE DIAGNOSIS: LEFT CARPAL TUNNEL SYNDROME  PROCEDURE:  LEFT CARPAL TUNNEL RELEASE  SURGEON: Park Breed, MD  TOURNIQUET TIME: 18 MIN   ANESTHESIA:   General  PREOPERATIVE INDICATIONS:  Morgan Daniels is a  55 y.o. female with a diagnosis of right carpal tunnel syndrome who failed conservative measures and elected for surgical management.    The risks benefits and alternatives were discussed with the patient preoperatively including but not limited to the risks of infection, bleeding, nerve injury, incomplete relief of symptoms, pillar pain, cardiopulmonary complications, the need for revision surgery, among others, and the patient was willing to proceed.  OPERATIVE FINDINGS: Thickened volar ligament and nerve compression.  OPERATIVE PROCEDURE: The patient is brought to the operating room placed in the supine position. General anesthesia was administered. The left upper extremity was prepped and draped in usual sterile fashion. Time out was performed. The arm was elevated and exsanguinated and the tourniquet was inflated. Incision was made in line with the radial border of the ring finger. The carpal tunnel transverse fascia was identified, cleaned, and incised sharply. The common sensory branches were visualized along with the superficial palmar arch and protected.  The median nerve was protected below. Deep retractors were placed underneath the transverse carpal ligament, protecting the nerve. I released the ligament completely, and then released the proximal distal volar forearm fascia. The nerve was identified, and visualized, and protected throughout the case. No masses or abnormalities were identified in ulnar bursa.  The wounds were irrigated copiously, and the wounds injected, and the skin closed with nylon followed by a volar splint and sterile gauze. Sponge and needle  counts were correct.  The patient tolerated this well, with no complications. Awakened and taken to recovery in good condition.

## 2015-07-18 NOTE — H&P (Signed)
THE PATIENT WAS SEEN PRIOR TO SURGERY TODAY.  HISTORY, ALLERGIES, HOME MEDICATIONS AND OPERATIVE PROCEDURE WERE REVIEWED. RISKS AND BENEFITS OF SURGERY DISCUSSED WITH PATIENT AGAIN.  NO CHANGES FROM INITIAL HISTORY AND PHYSICAL NOTED.    

## 2015-07-18 NOTE — Anesthesia Preprocedure Evaluation (Signed)
Anesthesia Evaluation  Patient identified by MRN, date of birth, ID band Patient awake    Reviewed: Allergy & Precautions, H&P , NPO status , Patient's Chart, lab work & pertinent test results  History of Anesthesia Complications Negative for: history of anesthetic complications  Airway Mallampati: II  TM Distance: >3 FB Neck ROM: Full    Dental  (+) Teeth Intact, Dental Advisory Given   Pulmonary Current Smoker,    Pulmonary exam normal        Cardiovascular hypertension, + Peripheral Vascular Disease  Normal cardiovascular exam     Neuro/Psych PSYCHIATRIC DISORDERS Anxiety Depression Left-sided weakness...mild CVA, Residual Symptoms    GI/Hepatic Neg liver ROS, hiatal hernia, PUD, GERD  Medicated and Controlled,  Endo/Other  diabetes, Well Controlled, Type 2, Insulin Dependent, Oral Hypoglycemic Agents  Renal/GU Renal InsufficiencyRenal diseasenegative Renal ROS  negative genitourinary   Musculoskeletal  (+) Arthritis , Osteoarthritis,  Fibromyalgia -  Abdominal   Peds negative pediatric ROS (+)  Hematology negative hematology ROS (+)   Anesthesia Other Findings   Reproductive/Obstetrics                             Anesthesia Physical  Anesthesia Plan  ASA: III  Anesthesia Plan: General   Post-op Pain Management:    Induction: Intravenous  Airway Management Planned: Oral ETT and LMA  Additional Equipment: Arterial line  Intra-op Plan:   Post-operative Plan: Extubation in OR  Informed Consent: I have reviewed the patients History and Physical, chart, labs and discussed the procedure including the risks, benefits and alternatives for the proposed anesthesia with the patient or authorized representative who has indicated his/her understanding and acceptance.   Dental advisory given  Plan Discussed with: CRNA and Surgeon  Anesthesia Plan Comments:         Anesthesia  Quick Evaluation

## 2015-07-18 NOTE — Discharge Instructions (Signed)
AMBULATORY SURGERY  °DISCHARGE INSTRUCTIONS ° ° °1) The drugs that you were given will stay in your system until tomorrow so for the next 24 hours you should not: ° °A) Drive an automobile °B) Make any legal decisions °C) Drink any alcoholic beverage ° ° °2) You may resume regular meals tomorrow.  Today it is better to start with liquids and gradually work up to solid foods. ° °You may eat anything you prefer, but it is better to start with liquids, then soup and crackers, and gradually work up to solid foods. ° ° °3) Please notify your doctor immediately if you have any unusual bleeding, trouble breathing, redness and pain at the surgery site, drainage, fever, or pain not relieved by medication. ° ° ° °4) Additional Instructions: ° ° ° ° ° ° ° °Please contact your physician with any problems or Same Day Surgery at [phone], Monday through Friday 6 am to 4 pm, or [place] at [place] [facility] number at [phone].AMBULATORY SURGERY  °DISCHARGE INSTRUCTIONS ° ° °5) The drugs that you were given will stay in your system until tomorrow so for the next 24 hours you should not: ° °D) Drive an automobile °E) Make any legal decisions °F) Drink any alcoholic beverage ° ° °6) You may resume regular meals tomorrow.  Today it is better to start with liquids and gradually work up to solid foods. ° °You may eat anything you prefer, but it is better to start with liquids, then soup and crackers, and gradually work up to solid foods. ° ° °7) Please notify your doctor immediately if you have any unusual bleeding, trouble breathing, redness and pain at the surgery site, drainage, fever, or pain not relieved by medication. ° ° ° °8) Additional Instructions: ° ° ° ° ° ° ° °Please contact your physician with any problems or Same Day Surgery at [phone], Monday through Friday 6 am to 4 pm, or [place] [facility] [place] [facility] number at [phone].AMBULATORY SURGERY  °DISCHARGE INSTRUCTIONS ° ° °9) The drugs that you were given  will stay in your system until tomorrow so for the next 24 hours you should not: ° °G) Drive an automobile °H) Make any legal decisions °I) Drink any alcoholic beverage ° ° °10) You may resume regular meals tomorrow.  Today it is better to start with liquids and gradually work up to solid foods. ° °You may eat anything you prefer, but it is better to start with liquids, then soup and crackers, and gradually work up to solid foods. ° ° °11) Please notify your doctor immediately if you have any unusual bleeding, trouble breathing, redness and pain at the surgery site, drainage, fever, or pain not relieved by medication. ° ° ° °12) Additional Instructions: ° ° ° ° ° ° ° °Please contact your physician with any problems or Same Day Surgery at [phone], Monday through Friday 6 am to 4 pm, or  at [place] [facility] number at [phone]. °

## 2015-07-18 NOTE — Transfer of Care (Signed)
Immediate Anesthesia Transfer of Care Note  Patient: Morgan Daniels  Procedure(s) Performed: Procedure(s): CARPAL TUNNEL RELEASE (Left)  Patient Location: PACU  Anesthesia Type:General  Level of Consciousness: sedated  Airway & Oxygen Therapy: Patient Spontanous Breathing and Patient connected to face mask oxygen  Post-op Assessment: Report given to RN and Post -op Vital signs reviewed and stable  Post vital signs: Reviewed and stable  Last Vitals:  Filed Vitals:   07/18/15 0615 07/18/15 0841  BP: 103/65 141/101  Pulse: 84 81  Temp: 36.8 C 36.1 C  Resp: 18 13    Last Pain: There were no vitals filed for this visit.       Complications: No apparent anesthesia complications

## 2015-07-19 NOTE — Addendum Note (Signed)
Addendum  created 07/19/15 1027 by Bernardo Heater, CRNA   Modules edited: Anesthesia Medication Administration

## 2015-07-26 ENCOUNTER — Ambulatory Visit: Payer: Medicare Other

## 2015-07-26 ENCOUNTER — Encounter: Payer: Medicare Other | Admitting: Pharmacist

## 2015-09-08 IMAGING — CR DG CHEST 1V PORT
1 series · 1 of 1 positions shown · non-contrast
Comparison: 01/31/2012

CLINICAL DATA: Numbness in left arm

EXAM:
PORTABLE CHEST - 1 VIEW

[x chest ap]
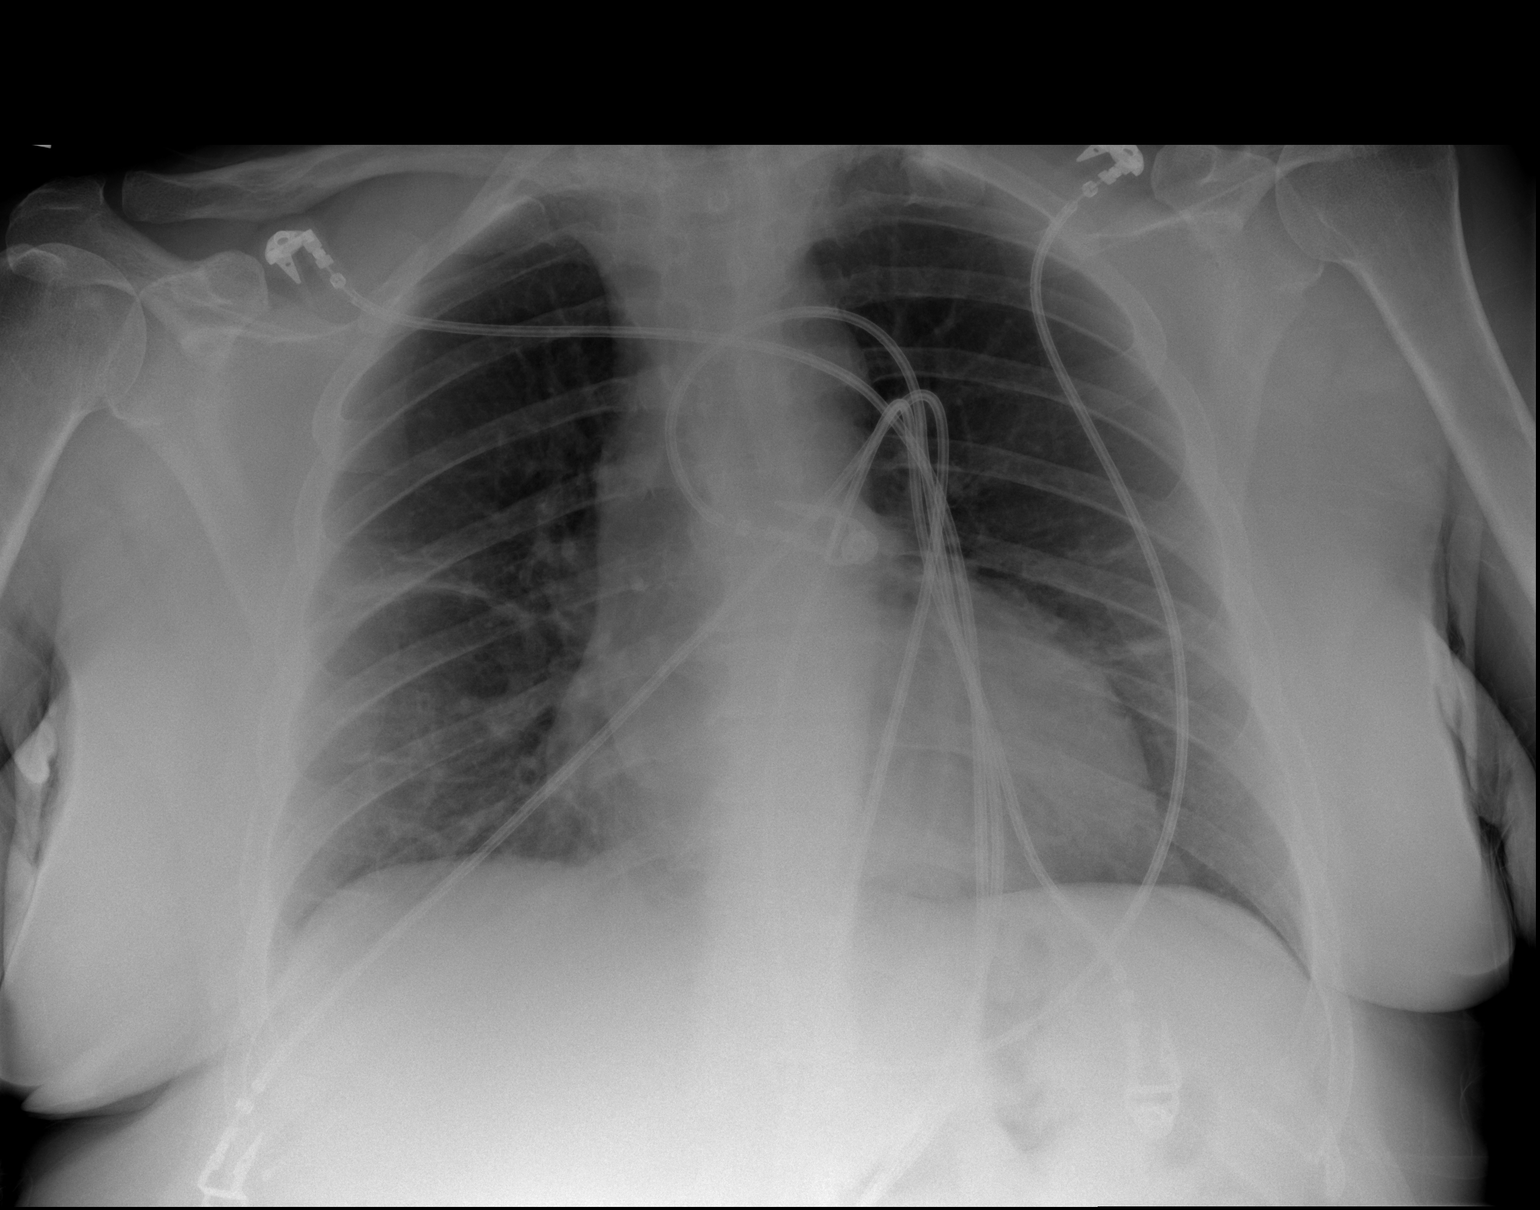

[1 of 1 positions shown; findings below may reference images not displayed]

FINDINGS: The cardiac shadow is stable. Minimal platelike atelectatic changes
are noted in the mid lungs bilaterally. No focal confluent
infiltrate is seen. No sizable effusion is noted.
IMPRESSION: Minimal bilateral platelike atelectasis.

## 2015-09-08 IMAGING — CT CT ANGIOGRAPHY NECK
2 of 7 series · 9 of 33 positions shown · IV contrast (APPLIED)
Comparison: Carotid Doppler 01/31/2012

CLINICAL DATA: Left leg weakness.  Prior left carotid surgery.

EXAM:
CT ANGIOGRAPHY NECK
TECHNIQUE: Multidetector CT imaging of the neck was performed using the
standard protocol during bolus administration of intravenous
contrast. Multiplanar CT image reconstructions and MIPs were
obtained to evaluate the vascular anatomy. Carotid stenosis
measurements (when applicable) are obtained utilizing NASCET
criteria, using the distal internal carotid diameter as the
denominator.
CONTRAST:  80 mL Isovue 370 IV

[Series 4: cta neck · axial · 0.39mm/px · z∈[-133,+16]mm · 5 of 225 slices shown]
[im 38/225  soft-tissue]
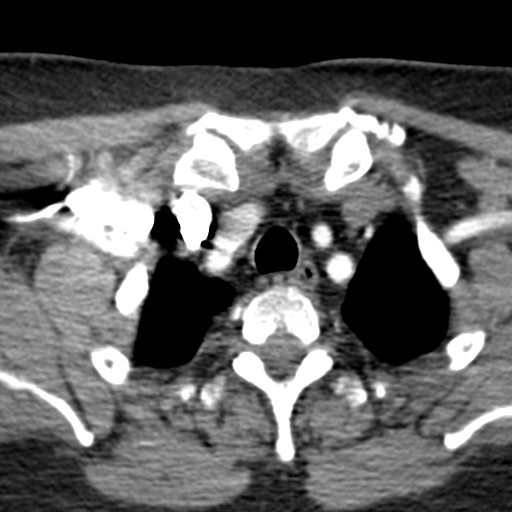
[im 75/225  bone]
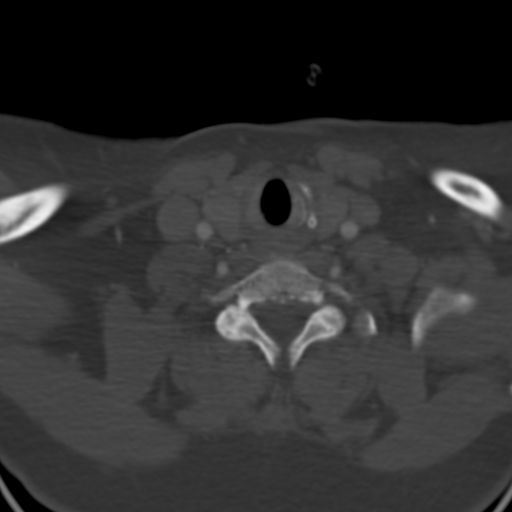
[im 113/225  soft-tissue]
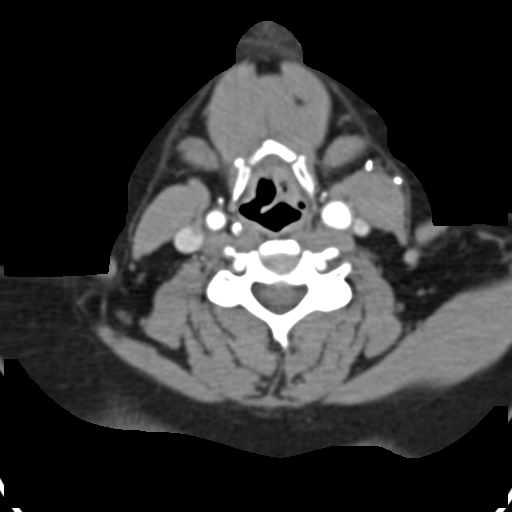
[im 150/225  bone]
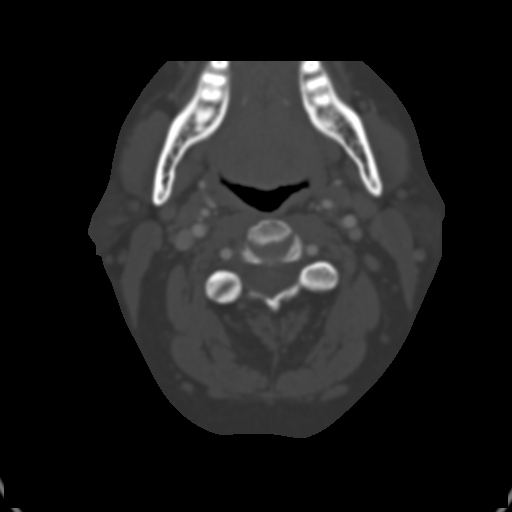
[im 187/225  soft-tissue]
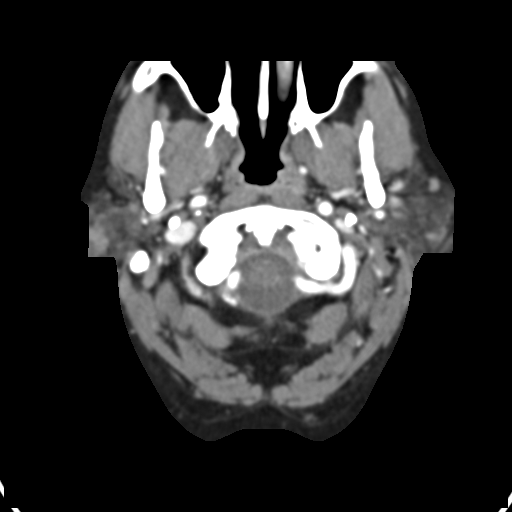

[Series 6: ax thin · axial · 0.38mm/px · z∈[-124,+9]mm · 4 of 223 slices shown]
[im 45/223  soft-tissue]
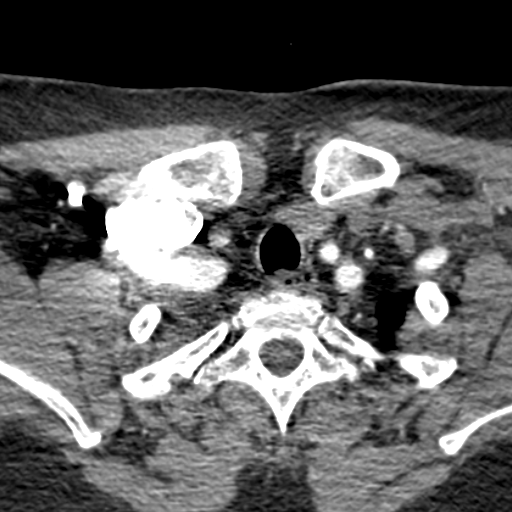
[im 89/223  soft-tissue]
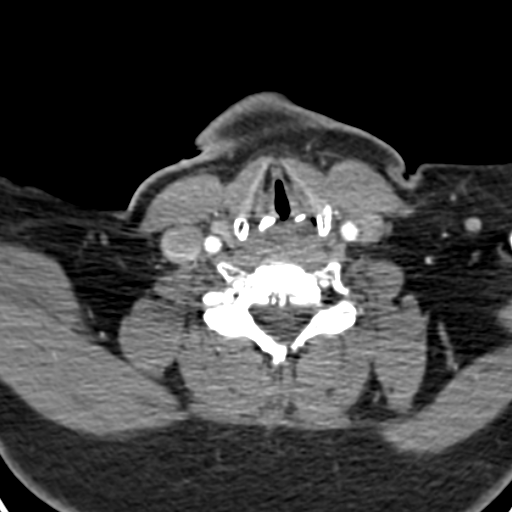
[im 134/223  soft-tissue]
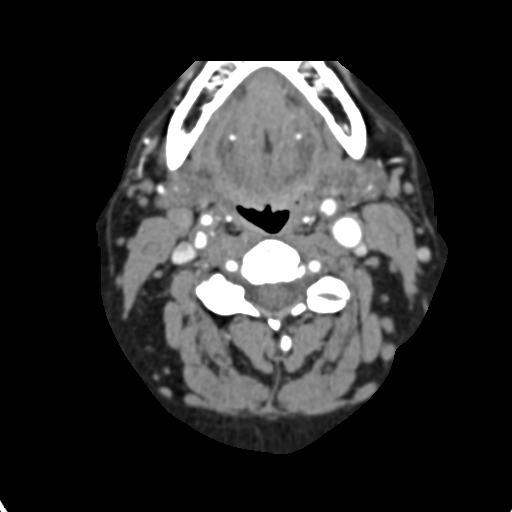
[im 178/223  soft-tissue]
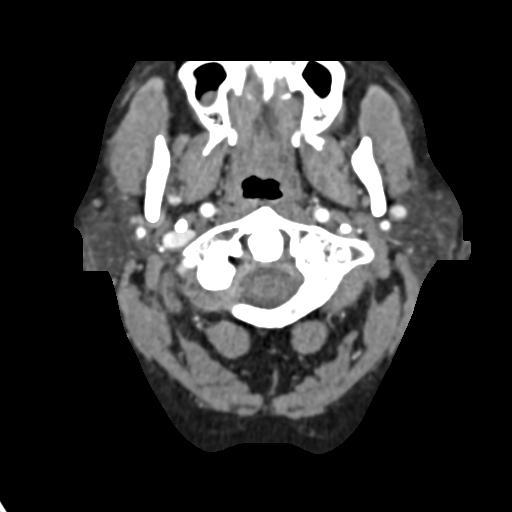

[9 of 33 positions shown; findings below may reference images not displayed]

FINDINGS: Negative for fracture or mass in the neck.  Lung apices are clear.

Proximal great vessels are widely patent. Standard branching of the
aortic arch.

Right carotid: Right common carotid artery is widely patent.
Critical stenosis involving the proximal left internal carotid
artery. There is a 15 mm segment of severe stenosis with the lumen
narrowed to 1.1 mm, corresponding to 80% diameter stenosis. This is
mostly soft plaque with a small amount of calcification. There is
calcified plaque of the right internal carotid artery below the
skullbase not causing significant stenosis of the lumen.

Left carotid: Left common carotid artery widely patent. Prior
carotid endarterectomy. Left internal carotid artery is widely
patent. There is some kinking and tortuosity of the origin of the
external carotid artery which could be causing some degree of
stenosis. This may be due to prior surgery.

Vertebral arteries: Both vertebral arteries are patent to the
basilar without stenosis.

Review of the MIP images confirms the above findings.
IMPRESSION: 80% diameter stenosis proximal right internal carotid artery

Prior carotid endarterectomy on the left. Left internal carotid
artery widely patent. Kinking and narrowing of the origin of the
left external carotid artery.

## 2015-09-19 ENCOUNTER — Ambulatory Visit: Payer: Medicare Other | Admitting: Family

## 2015-09-19 ENCOUNTER — Ambulatory Visit (HOSPITAL_COMMUNITY): Payer: Medicare Other

## 2015-09-26 ENCOUNTER — Ambulatory Visit: Payer: Medicare Other | Admitting: Family

## 2015-09-27 ENCOUNTER — Ambulatory Visit: Payer: Medicare Other | Admitting: Psychiatry

## 2015-09-28 DIAGNOSIS — I10 Essential (primary) hypertension: Secondary | ICD-10-CM | POA: Diagnosis not present

## 2015-09-28 DIAGNOSIS — I679 Cerebrovascular disease, unspecified: Secondary | ICD-10-CM | POA: Diagnosis not present

## 2015-09-28 DIAGNOSIS — E1140 Type 2 diabetes mellitus with diabetic neuropathy, unspecified: Secondary | ICD-10-CM | POA: Diagnosis not present

## 2015-09-28 DIAGNOSIS — M150 Primary generalized (osteo)arthritis: Secondary | ICD-10-CM | POA: Diagnosis not present

## 2015-09-28 DIAGNOSIS — F331 Major depressive disorder, recurrent, moderate: Secondary | ICD-10-CM | POA: Diagnosis not present

## 2015-09-28 DIAGNOSIS — K580 Irritable bowel syndrome with diarrhea: Secondary | ICD-10-CM | POA: Diagnosis not present

## 2015-09-28 DIAGNOSIS — Z0001 Encounter for general adult medical examination with abnormal findings: Secondary | ICD-10-CM | POA: Diagnosis not present

## 2015-09-28 DIAGNOSIS — F17210 Nicotine dependence, cigarettes, uncomplicated: Secondary | ICD-10-CM | POA: Diagnosis not present

## 2015-11-01 ENCOUNTER — Encounter: Payer: Medicare Other | Admitting: Pharmacist

## 2015-11-08 ENCOUNTER — Ambulatory Visit (INDEPENDENT_AMBULATORY_CARE_PROVIDER_SITE_OTHER): Payer: Medicare Other | Admitting: Psychiatry

## 2015-11-08 DIAGNOSIS — F331 Major depressive disorder, recurrent, moderate: Secondary | ICD-10-CM

## 2015-11-08 MED ORDER — ARIPIPRAZOLE 5 MG PO TABS: 5.0000 mg | ORAL_TABLET | ORAL | 4 refills | Status: DC

## 2015-11-08 MED ORDER — FLUOXETINE HCL 40 MG PO CAPS: 80.0000 mg | ORAL_CAPSULE | Freq: Every day | ORAL | 1 refills | Status: DC

## 2015-11-08 NOTE — Progress Notes (Signed)
Patient ID: Morgan Daniels, female   DOB: 09-29-1960, 55 y.o.   MRN: [id number] [facility] MD/PA/NP OP Progress Note  11/08/2015 10:20 AM Morgan Daniels  MRN:  [id number]  Subjective:  Patient returns for follow-up of her major depressive disorder, moderate, recurrent. Patient reports that she has been dealing with a lot of stuff and also states that she misses the Klonopin. She was able to pay for taper off the Klonopin without any side effects. She is currently not taking any Klonopin. States that her mother has been sick and in the hospital and this has been stressful for her. Reports she is not overeating as much given that she is sleeping early. She does agree that she is irritable a lot of the time. Her hemoglobin A1c's at 11 and states that she is trying to get her medications okay. Denies suicidal thoughts.   Chief Complaint: more irrtable  Visit Diagnosis:     ICD-9-CM ICD-10-CM   1. Major depressive disorder, recurrent episode, moderate (HCC) 296.32 F33.1     Past Medical History:  Past Medical History:  Diagnosis Date  . Anxiety   . Arthritis   . Carotid artery occlusion   . Chronic kidney disease May 2017   UTI  . Depression   . Diabetes ([place])   . Diverticulosis   . Fatty liver   . Fibromyalgia   . GERD (gastroesophageal reflux disease)   . H/O hiatal hernia   . Hypertension   . IBS (irritable bowel syndrome)   . Peptic ulcer   . Plantar fasciitis   . Stroke [facility]) Dec. 14,2013   Right side-ministroke    Past Surgical History:  Procedure Laterality Date  . ABDOMINAL HYSTERECTOMY  1990  . BACK SURGERY  2000, 2004  . CAROTID ENDARTERECTOMY Left 05/06/12  . CARPAL TUNNEL RELEASE Left 07/18/2015   Procedure: CARPAL TUNNEL RELEASE;  Surgeon: Earnestine Leys, MD;  Location: [facility] ORS;  Service: Orthopedics;  Laterality: Left;  . CEREBRAL ANGIOGRAM Bilateral 05/03/2012   Procedure: CEREBRAL ANGIOGRAM;  Surgeon: Angelia Mould, MD;  Location: [facility] CATH LAB;  Service:  Cardiovascular;  Laterality: Bilateral;  . CHOLECYSTECTOMY  2001  . ENDARTERECTOMY Left 05/06/2012   Procedure: ENDARTERECTOMY CAROTID;  Surgeon: Angelia Mould, MD;  Location: [place];  Service: Vascular;  Laterality: Left;  . ENDARTERECTOMY Right 08/09/2013   Procedure: ENDARTERECTOMY CAROTID-RIGHT;  Surgeon: Angelia Mould, MD;  Location: [place];  Service: Vascular;  Laterality: Right;  . HERNIA REPAIR    . PATCH ANGIOPLASTY Left 05/06/2012   Procedure: WITH DACRON PATCH ANGIOPLASTY ;  Surgeon: Angelia Mould, MD;  Location: [place];  Service: Vascular;  Laterality: Left;  . [place]  2004  . TONSILLECTOMY    . TUBAL LIGATION     Family History:  Family History  Problem Relation Age of Onset  . Hypertension Mother   . Hyperlipidemia Mother   . Deep vein thrombosis Mother   . Cancer Father   . Alcohol abuse Father   . Hypertension Maternal Grandmother   . Deep vein thrombosis Sister   . Alcohol abuse Sister   . Alcohol abuse Sister    Social History:  Social History   Social History  . Marital status: Married    Spouse name: N/A  . Number of children: N/A  . Years of education: N/A   Social History Main Topics  . Smoking status: Current Every Day Smoker    Packs/day: 1.00    Years: 40.00  Types: Cigarettes, E-cigarettes    Start date: 11/09/1970  . Smokeless tobacco: Never Used     Comment: using vabor   . Alcohol use No     Comment:   quite i2 yearts   . Drug use: No  . Sexual activity: No   Other Topics Concern  . Not on file   Social History Narrative  . No narrative on file   Additional History:   Assessment:   Musculoskeletal: Strength & Muscle Tone: within normal limits Gait & Station: normal Patient leans: N/A  Psychiatric Specialty Exam: HPI  Review of Systems  Psychiatric/Behavioral: Positive for depression (States that it's still there but is improved where she is now having several good days as noted above.). Negative  for hallucinations, memory loss, substance abuse and suicidal ideas. The patient is not nervous/anxious and does not have insomnia.   All other systems reviewed and are negative.   There were no vitals taken for this visit.There is no height or weight on file to calculate BMI.  General Appearance: Well Groomed  Eye Contact:  Good  Speech:  Normal Rate  Volume:  Normal  Mood:  irritable  Affect:  Congruent with mood   Thought Process:  Linear  Orientation:  Full (Time, Place, and Person)  Thought Content:  Negative  Suicidal Thoughts:  No  Homicidal Thoughts:  No  Memory:  Immediate;   Good Recent;   Good Remote;   Good  Judgement:  Good  Insight:  Good  Psychomotor Activity:  Negative  Concentration:  Good  Recall:  Good  Fund of Knowledge: Good  Language: Good  Akathisia:  Negative  Handed:    AIMS (if indicated):  Not done today  Assets:  Communication Skills Desire for Improvement Social Support  ADL's:  Intact  Cognition: WNL  Sleep:  fair   Is the patient at risk to self?  No. Has the patient been a risk to self in the past 6 months?  No. Has the patient been a risk to self within the distant past?  No. Is the patient a risk to others?  No. Has the patient been a risk to others in the past 6 months?  No. Has the patient been a risk to others within the distant past?  No.  Current Medications: Current Outpatient Prescriptions  Medication Sig Dispense Refill  . ARIPiprazole (ABILIFY) 5 MG tablet Take 1 tablet (5 mg total) by mouth every morning. 30 tablet 4  . aspirin EC 81 MG tablet Take 81 mg by mouth daily.    [name] atorvastatin (LIPITOR) 10 MG tablet Take 10 mg by mouth daily at 6 PM.     . bisoprolol (ZEBETA) 5 MG tablet Take 2.5 mg by mouth at bedtime.     . celecoxib (CELEBREX) 200 MG capsule TAKE 1 CAPSULE(S) BY MOUTH DAILY FOR PAIN  2  . cetirizine (ZYRTEC) 10 MG tablet Take 10 mg by mouth daily.    . Cholecalciferol (VITAMIN D-3) 5000 UNITS TABS Take 5,000  Units by mouth daily.     . clonazePAM (KLONOPIN) 0.5 MG tablet Take 1 tablet (0.5 mg total) by mouth 2 (two) times daily as needed for anxiety. 60 tablet 4  . fluconazole (DIFLUCAN) 150 MG tablet TAKE 1 TABLET BY MOUTH ONCE FOR YEAST INFECTION. MAY REPEAT IN 3 DAYS FOR PERSISTENT SYMPTOMS  1  . FLUoxetine (PROZAC) 40 MG capsule Take 2 capsules (80 mg total) by mouth daily. 60 capsule 1  . gabapentin (  NEURONTIN) 300 MG capsule Take 600 mg by mouth 2 (two) times daily.    . hydrochlorothiazide (HYDRODIURIL) 12.5 MG tablet TAKE 1 TABLET BY MOUTH DAILY FOR BLOOD PRESSURE/EDEMA  1  . HYDROcodone-acetaminophen (NORCO) 5-325 MG tablet Take 1-2 tablets by mouth every 6 (six) hours as needed. 50 tablet 0  . Insulin Degludec (TRESIBA FLEXTOUCH [place]) Inject 50 Units into the skin daily after breakfast.     . NOVOFINE 32G X 6 MM MISC     . NOVOLOG FLEXPEN 100 UNIT/ML FlexPen INJECT 20 UNITS SUBCUTANEOUSLY TWICE A DAY AND 15 UNITS ONCE A DAY PRIOR TO MEALS FOR DIABETES  3  . omeprazole (PRILOSEC OTC) 20 MG tablet Take 40 mg by mouth daily.    Glory Rosebush DELICA LANCETS FINE MISC CHECK BLOOD SUGAR THREE TIMES A DAY  5  . ONETOUCH VERIO test strip CHECK BLOOD SUGAR THREE TIMES A DAY  5  . tiZANidine (ZANAFLEX) 4 MG tablet TAKE 1 TABLET BY MOUTH TWICE A DAY AS NEEDED FOR MUSCLE PAIN AND SPASMS  2  . VIBERZI 75 MG TABS Take 1 tablet by mouth 2 (two) times daily.  2  . VICTOZA 18 MG/3ML SOPN 1.2 mg daily.      No current facility-administered medications for this visit.     Medical Decision Making:  Established Problem, Worsening (2), Review of Medication Regimen & Side Effects (2) and Review of New Medication or Change in Dosage (2)  Treatment Plan Summary:Medication management and Plan   Patient's symptoms are consistent with major depressive disorder, moderate recurrent.   Increase Prozac to 80 mg daily and Continue Abilify 5 mg in the morning.   Diabetes Patient reports that her recent hemoglobin A1c  was at the 11. Discussed with patient that.she needs to pay attention to her diet and improve her sugar levels and her hemoglobin A1c reading. Discussed with patient and educated her about the impact her diabetes has on her mood and her motivation to do things.  Patient will follow up in 2 months. She can call with any questions before that.   Evaleen Sant 11/08/2015, 10:20 AM

## 2015-11-13 DIAGNOSIS — Z1231 Encounter for screening mammogram for malignant neoplasm of breast: Secondary | ICD-10-CM | POA: Diagnosis not present

## 2015-11-14 ENCOUNTER — Encounter: Payer: Self-pay | Admitting: Family

## 2015-11-19 ENCOUNTER — Other Ambulatory Visit: Payer: Self-pay | Admitting: *Deleted

## 2015-11-19 DIAGNOSIS — I6523 Occlusion and stenosis of bilateral carotid arteries: Secondary | ICD-10-CM

## 2015-11-19 DIAGNOSIS — Z48812 Encounter for surgical aftercare following surgery on the circulatory system: Secondary | ICD-10-CM

## 2015-11-21 ENCOUNTER — Ambulatory Visit (HOSPITAL_COMMUNITY): Payer: Medicare Other

## 2015-11-21 ENCOUNTER — Ambulatory Visit: Payer: Medicare Other | Admitting: Family

## 2015-11-22 ENCOUNTER — Other Ambulatory Visit: Payer: Self-pay | Admitting: Nurse Practitioner

## 2015-11-22 DIAGNOSIS — N6489 Other specified disorders of breast: Secondary | ICD-10-CM

## 2015-11-28 ENCOUNTER — Inpatient Hospital Stay
Admission: RE | Admit: 2015-11-28 | Discharge: 2015-11-28 | Disposition: A | Discharge status: Home / Self Care | Payer: Self-pay | Source: Ambulatory Visit | Attending: *Deleted | Admitting: *Deleted

## 2015-11-28 ENCOUNTER — Other Ambulatory Visit: Payer: Self-pay | Admitting: *Deleted

## 2015-11-28 DIAGNOSIS — Z9289 Personal history of other medical treatment: Secondary | ICD-10-CM

## 2015-12-11 ENCOUNTER — Ambulatory Visit
Admission: RE | Admit: 2015-12-11 | Discharge: 2015-12-11 | Disposition: A | Discharge status: Home / Self Care | Payer: Medicare Other | Source: Ambulatory Visit | Attending: Nurse Practitioner | Admitting: Nurse Practitioner

## 2015-12-11 DIAGNOSIS — N6489 Other specified disorders of breast: Secondary | ICD-10-CM

## 2015-12-18 ENCOUNTER — Other Ambulatory Visit: Payer: Self-pay | Admitting: Nurse Practitioner

## 2015-12-18 DIAGNOSIS — N6489 Other specified disorders of breast: Secondary | ICD-10-CM

## 2015-12-19 ENCOUNTER — Ambulatory Visit
Admission: RE | Admit: 2015-12-19 | Discharge: 2015-12-19 | Disposition: A | Discharge status: Home / Self Care | Payer: Medicare Other | Source: Ambulatory Visit | Attending: Nurse Practitioner | Admitting: Nurse Practitioner

## 2015-12-19 DIAGNOSIS — N6489 Other specified disorders of breast: Secondary | ICD-10-CM

## 2015-12-19 DIAGNOSIS — R928 Other abnormal and inconclusive findings on diagnostic imaging of breast: Secondary | ICD-10-CM | POA: Diagnosis not present

## 2016-01-03 DIAGNOSIS — E1140 Type 2 diabetes mellitus with diabetic neuropathy, unspecified: Secondary | ICD-10-CM | POA: Diagnosis not present

## 2016-01-03 DIAGNOSIS — K580 Irritable bowel syndrome with diarrhea: Secondary | ICD-10-CM | POA: Diagnosis not present

## 2016-01-03 DIAGNOSIS — Z23 Encounter for immunization: Secondary | ICD-10-CM | POA: Diagnosis not present

## 2016-01-03 DIAGNOSIS — I10 Essential (primary) hypertension: Secondary | ICD-10-CM | POA: Diagnosis not present

## 2016-01-03 DIAGNOSIS — F331 Major depressive disorder, recurrent, moderate: Secondary | ICD-10-CM | POA: Diagnosis not present

## 2016-01-03 DIAGNOSIS — I679 Cerebrovascular disease, unspecified: Secondary | ICD-10-CM | POA: Diagnosis not present

## 2016-01-03 DIAGNOSIS — F17210 Nicotine dependence, cigarettes, uncomplicated: Secondary | ICD-10-CM | POA: Diagnosis not present

## 2016-01-08 ENCOUNTER — Ambulatory Visit (INDEPENDENT_AMBULATORY_CARE_PROVIDER_SITE_OTHER): Payer: Medicare Other | Admitting: Psychiatry

## 2016-01-08 ENCOUNTER — Encounter: Payer: Self-pay | Admitting: Psychiatry

## 2016-01-08 VITALS — BP 124/70 | HR 93 | Ht 67.0 in | Wt 188.0 lb

## 2016-01-08 DIAGNOSIS — I6523 Occlusion and stenosis of bilateral carotid arteries: Secondary | ICD-10-CM | POA: Diagnosis not present

## 2016-01-08 DIAGNOSIS — F331 Major depressive disorder, recurrent, moderate: Secondary | ICD-10-CM | POA: Diagnosis not present

## 2016-01-08 MED ORDER — FLUOXETINE HCL 40 MG PO CAPS: 80.0000 mg | ORAL_CAPSULE | Freq: Every day | ORAL | 1 refills | Status: DC

## 2016-01-08 NOTE — Progress Notes (Signed)
Patient ID: Morgan Daniels, female   DOB: 03/02/1960, 55 y.o.   MRN: [id number] [facility] MD/PA/NP OP Progress Note  01/08/2016 9:46 AM Morgan Daniels  MRN:  [id number]  Subjective:  Patient returns for follow-up of her major depressive disorder, moderate, recurrent. Patient reports she has been doing quite well. She has tolerated the Prozac at 80 mg quite well. Denies any side effects. States that she has addressed her diabetes as well and her hemoglobin A1c is at 7.4. She is doing well after tapering off the Klonopin. Sleeping well and eating well. States her irritability has gotten better as well. Denies any complaints. Denies suicidal thoughts.   Chief Complaint: Mood had much improved Chief Complaint    Follow-up     Visit Diagnosis:     ICD-9-CM ICD-10-CM   1. Major depressive disorder, recurrent episode, moderate (HCC) 296.32 F33.1     Past Medical History:  Past Medical History:  Diagnosis Date  . Anxiety   . Arthritis   . Carotid artery occlusion   . Chronic kidney disease May 2017   UTI  . Depression   . Diabetes ([place])   . Diverticulosis   . Fatty liver   . Fibromyalgia   . GERD (gastroesophageal reflux disease)   . H/O hiatal hernia   . Hypertension   . IBS (irritable bowel syndrome)   . Peptic ulcer   . Plantar fasciitis   . Stroke [facility]) Dec. 14,2013   Right side-ministroke    Past Surgical History:  Procedure Laterality Date  . ABDOMINAL HYSTERECTOMY  1990  . BACK SURGERY  2000, 2004  . CAROTID ENDARTERECTOMY Left 05/06/12  . CARPAL TUNNEL RELEASE Left 07/18/2015   Procedure: CARPAL TUNNEL RELEASE;  Surgeon: Earnestine Leys, MD;  Location: [facility] ORS;  Service: Orthopedics;  Laterality: Left;  . CEREBRAL ANGIOGRAM Bilateral 05/03/2012   Procedure: CEREBRAL ANGIOGRAM;  Surgeon: Angelia Mould, MD;  Location: [facility] CATH LAB;  Service: Cardiovascular;  Laterality: Bilateral;  . CHOLECYSTECTOMY  2001  . ENDARTERECTOMY Left 05/06/2012   Procedure: ENDARTERECTOMY  CAROTID;  Surgeon: Angelia Mould, MD;  Location: [place];  Service: Vascular;  Laterality: Left;  . ENDARTERECTOMY Right 08/09/2013   Procedure: ENDARTERECTOMY CAROTID-RIGHT;  Surgeon: Angelia Mould, MD;  Location: [place];  Service: Vascular;  Laterality: Right;  . HERNIA REPAIR    . PATCH ANGIOPLASTY Left 05/06/2012   Procedure: WITH DACRON PATCH ANGIOPLASTY ;  Surgeon: Angelia Mould, MD;  Location: [place];  Service: Vascular;  Laterality: Left;  . [place]  2004  . TONSILLECTOMY    . TUBAL LIGATION     Family History:  Family History  Problem Relation Age of Onset  . Hypertension Mother   . Hyperlipidemia Mother   . Deep vein thrombosis Mother   . Cancer Father   . Alcohol abuse Father   . Hypertension Maternal Grandmother   . Deep vein thrombosis Sister   . Alcohol abuse Sister   . Alcohol abuse Sister    Social History:  Social History   Social History  . Marital status: Married    Spouse name: N/A  . Number of children: N/A  . Years of education: N/A   Social History Main Topics  . Smoking status: Current Every Day Smoker    Packs/day: 1.00    Years: 40.00    Types: Cigarettes    Start date: 11/09/1970  . Smokeless tobacco: Never Used     Comment: using vabor   .  Alcohol use No     Comment:   quite i2 yearts   . Drug use: No  . Sexual activity: Not Currently    Partners: Male   Other Topics Concern  . None   Social History Narrative  . None   Additional History:   Assessment:   Musculoskeletal: Strength & Muscle Tone: within normal limits Gait & Station: normal Patient leans: N/A  Psychiatric Specialty Exam: HPI  Review of Systems  Psychiatric/Behavioral: Positive for depression (States that it's still there but is improved where she is now having several good days as noted above.). Negative for hallucinations, memory loss, substance abuse and suicidal ideas. The patient is not nervous/anxious and does not have insomnia.    All other systems reviewed and are negative.   Blood pressure 124/70, pulse 93, height 5\' 7"  (1.702 m), weight 188 lb (85.3 kg).Body mass index is 29.44 kg/m.  General Appearance: Well Groomed  Eye Contact:  Good  Speech:  Normal Rate  Volume:  Normal  Mood:  improved  Affect:  Congruent with mood   Thought Process:  Linear  Orientation:  Full (Time, Place, and Person)  Thought Content:  Negative  Suicidal Thoughts:  No  Homicidal Thoughts:  No  Memory:  Immediate;   Good Recent;   Good Remote;   Good  Judgement:  Good  Insight:  Good  Psychomotor Activity:  Negative  Concentration:  Good  Recall:  Good  Fund of Knowledge: Good  Language: Good  Akathisia:  Negative  Handed:    AIMS (if indicated):  Not done today  Assets:  Communication Skills Desire for Improvement Social Support  ADL's:  Intact  Cognition: WNL  Sleep:  fair   Is the patient at risk to self?  No. Has the patient been a risk to self in the past 6 months?  No. Has the patient been a risk to self within the distant past?  No. Is the patient a risk to others?  No. Has the patient been a risk to others in the past 6 months?  No. Has the patient been a risk to others within the distant past?  No.  Current Medications: Current Outpatient Prescriptions  Medication Sig Dispense Refill  . ARIPiprazole (ABILIFY) 5 MG tablet Take 1 tablet (5 mg total) by mouth every morning. 30 tablet 4  . aspirin EC 81 MG tablet Take 81 mg by mouth daily.    [name] atorvastatin (LIPITOR) 10 MG tablet Take 10 mg by mouth daily at 6 PM.     . bisoprolol (ZEBETA) 5 MG tablet Take 2.5 mg by mouth at bedtime.     . cetirizine (ZYRTEC) 10 MG tablet Take 10 mg by mouth daily.    . Cholecalciferol (VITAMIN D-3) 5000 UNITS TABS Take 5,000 Units by mouth daily.     [name] FLUoxetine (PROZAC) 40 MG capsule Take 2 capsules (80 mg total) by mouth daily. 60 capsule 1  . gabapentin (NEURONTIN) 300 MG capsule Take 600 mg by mouth 2 (two) times  daily.    . hydrochlorothiazide (HYDRODIURIL) 12.5 MG tablet TAKE 1 TABLET BY MOUTH DAILY FOR BLOOD PRESSURE/EDEMA  1  . HYDROcodone-acetaminophen (NORCO) 5-325 MG tablet Take 1-2 tablets by mouth every 6 (six) hours as needed. 50 tablet 0  . Insulin Degludec (TRESIBA FLEXTOUCH [place]) Inject 50 Units into the skin daily after breakfast.     . NOVOFINE 32G X 6 MM MISC     . omeprazole (PRILOSEC OTC) 20 MG  tablet Take 40 mg by mouth daily.    Glory Rosebush DELICA LANCETS FINE MISC CHECK BLOOD SUGAR THREE TIMES A DAY  5  . ONETOUCH VERIO test strip CHECK BLOOD SUGAR THREE TIMES A DAY  5  . tiZANidine (ZANAFLEX) 4 MG tablet TAKE 1 TABLET BY MOUTH TWICE A DAY AS NEEDED FOR MUSCLE PAIN AND SPASMS  2  . VIBERZI 75 MG TABS Take 1 tablet by mouth 2 (two) times daily.  2  . VICTOZA 18 MG/3ML SOPN 1.2 mg daily.     . celecoxib (CELEBREX) 200 MG capsule TAKE 1 CAPSULE(S) BY MOUTH DAILY FOR PAIN  2  . clonazePAM (KLONOPIN) 0.5 MG tablet Take 1 tablet (0.5 mg total) by mouth 2 (two) times daily as needed for anxiety. (Patient not taking: Reported on 01/08/2016) 60 tablet 4  . fluconazole (DIFLUCAN) 150 MG tablet TAKE 1 TABLET BY MOUTH ONCE FOR YEAST INFECTION. MAY REPEAT IN 3 DAYS FOR PERSISTENT SYMPTOMS  1  . NOVOLOG FLEXPEN 100 UNIT/ML FlexPen INJECT 20 UNITS SUBCUTANEOUSLY TWICE A DAY AND 15 UNITS ONCE A DAY PRIOR TO MEALS FOR DIABETES  3   No current facility-administered medications for this visit.     Medical Decision Making:  Established Problem, Worsening (2), Review of Medication Regimen & Side Effects (2) and Review of New Medication or Change in Dosage (2)  Treatment Plan Summary:Medication management and Plan   Patient's symptoms are consistent with major depressive disorder, moderate recurrent.   Continue Prozac to 80 mg daily and Continue Abilify 5 mg in the morning.   Diabetes patient's hemoglobin A1c is  down 7.4, patient reports that she is taking better care of the way she eats as  well.  Patient will follow up in 3 months. She can call with any questions before that.   Oscar Hank 01/08/2016, 9:46 AM

## 2016-01-24 DIAGNOSIS — M7540 Impingement syndrome of unspecified shoulder: Secondary | ICD-10-CM | POA: Insufficient documentation

## 2016-01-24 DIAGNOSIS — M7542 Impingement syndrome of left shoulder: Secondary | ICD-10-CM | POA: Diagnosis not present

## 2016-03-18 ENCOUNTER — Other Ambulatory Visit: Payer: Self-pay | Admitting: Psychiatry

## 2016-04-01 ENCOUNTER — Telehealth: Payer: Self-pay

## 2016-04-01 NOTE — Telephone Encounter (Signed)
Please refill the Prozac

## 2016-04-01 NOTE — Telephone Encounter (Signed)
left message on doctor's line giving ok to refill prozac 40mg  capsule #60 take bid with no additional refills

## 2016-04-01 NOTE — Telephone Encounter (Signed)
pt called states she needs a refill on her prozac. pt has appt for end of this month. pt needs enough to get to appt.

## 2016-04-08 ENCOUNTER — Ambulatory Visit: Payer: Self-pay | Admitting: Psychiatry

## 2016-04-08 DIAGNOSIS — F411 Generalized anxiety disorder: Secondary | ICD-10-CM | POA: Diagnosis not present

## 2016-04-08 DIAGNOSIS — K580 Irritable bowel syndrome with diarrhea: Secondary | ICD-10-CM | POA: Diagnosis not present

## 2016-04-08 DIAGNOSIS — M150 Primary generalized (osteo)arthritis: Secondary | ICD-10-CM | POA: Diagnosis not present

## 2016-04-08 DIAGNOSIS — F331 Major depressive disorder, recurrent, moderate: Secondary | ICD-10-CM | POA: Diagnosis not present

## 2016-04-08 DIAGNOSIS — I10 Essential (primary) hypertension: Secondary | ICD-10-CM | POA: Diagnosis not present

## 2016-04-08 DIAGNOSIS — E1140 Type 2 diabetes mellitus with diabetic neuropathy, unspecified: Secondary | ICD-10-CM | POA: Diagnosis not present

## 2016-04-16 ENCOUNTER — Ambulatory Visit: Payer: Medicare Other | Admitting: Psychiatry

## 2016-04-27 ENCOUNTER — Other Ambulatory Visit: Payer: Self-pay | Admitting: Psychiatry

## 2016-05-07 ENCOUNTER — Encounter: Payer: Self-pay | Admitting: Psychiatry

## 2016-05-07 ENCOUNTER — Ambulatory Visit (INDEPENDENT_AMBULATORY_CARE_PROVIDER_SITE_OTHER): Payer: Medicare Other | Admitting: Psychiatry

## 2016-05-07 VITALS — BP 126/73 | HR 86 | Temp 99.0°F | Wt 194.2 lb

## 2016-05-07 DIAGNOSIS — F331 Major depressive disorder, recurrent, moderate: Secondary | ICD-10-CM | POA: Diagnosis not present

## 2016-05-07 MED ORDER — FLUOXETINE HCL 40 MG PO CAPS: 80.0000 mg | ORAL_CAPSULE | Freq: Every day | ORAL | 2 refills | Status: DC

## 2016-05-07 NOTE — Progress Notes (Signed)
Patient ID: Morgan Daniels, female   DOB: [birth year], 56 y.o.   MRN: [id number] [facility] MD/PA/NP OP Progress Note  05/07/2016 3:08 PM SHERISE GEERDES  MRN:  [id number]  Subjective:  Patient returns for follow-up of her major depressive disorder, moderate, recurrent. Patient reports she has been doing ok, her pain medications were cut due to new opioid laws and she has been home more. Sh estopped the abilify because of the cost. She continues to take Prozac at 80mg , ran out 5 days ago. Denies any side effects. States that she has addressed her diabetes as well and her hemoglobin A1c is at 7.2. Sleeping well and eating well.  Denies any complaints. Denies suicidal thoughts.   Chief Complaint: ok mood Chief Complaint    Follow-up; Medication Refill     Visit Diagnosis:     ICD-9-CM ICD-10-CM   1. Major depressive disorder, recurrent episode, moderate (HCC) 296.32 F33.1     Past Medical History:  Past Medical History:  Diagnosis Date  . Anxiety   . Arthritis   . Carotid artery occlusion   . Chronic kidney disease May 2017   UTI  . Depression   . Diabetes ([place])   . Diverticulosis   . Fatty liver   . Fibromyalgia   . GERD (gastroesophageal reflux disease)   . H/O hiatal hernia   . Hypertension   . IBS (irritable bowel syndrome)   . Peptic ulcer   . Plantar fasciitis   . Stroke [facility]) Dec. 14,2013   Right side-ministroke    Past Surgical History:  Procedure Laterality Date  . ABDOMINAL HYSTERECTOMY  1990  . BACK SURGERY  2000, 2004  . CAROTID ENDARTERECTOMY Left 05/06/12  . CARPAL TUNNEL RELEASE Left 07/18/2015   Procedure: CARPAL TUNNEL RELEASE;  Surgeon: Earnestine Leys, MD;  Location: [facility] ORS;  Service: Orthopedics;  Laterality: Left;  . CEREBRAL ANGIOGRAM Bilateral 05/03/2012   Procedure: CEREBRAL ANGIOGRAM;  Surgeon: Angelia Mould, MD;  Location: [facility] CATH LAB;  Service: Cardiovascular;  Laterality: Bilateral;  . CHOLECYSTECTOMY  2001  . ENDARTERECTOMY Left 05/06/2012   Procedure: ENDARTERECTOMY CAROTID;  Surgeon: Angelia Mould, MD;  Location: [place];  Service: Vascular;  Laterality: Left;  . ENDARTERECTOMY Right 08/09/2013   Procedure: ENDARTERECTOMY CAROTID-RIGHT;  Surgeon: Angelia Mould, MD;  Location: [place];  Service: Vascular;  Laterality: Right;  . HERNIA REPAIR    . PATCH ANGIOPLASTY Left 05/06/2012   Procedure: WITH DACRON PATCH ANGIOPLASTY ;  Surgeon: Angelia Mould, MD;  Location: [place];  Service: Vascular;  Laterality: Left;  . [place]  2004  . TONSILLECTOMY    . TUBAL LIGATION     Family History:  Family History  Problem Relation Age of Onset  . Hypertension Mother   . Hyperlipidemia Mother   . Deep vein thrombosis Mother   . Cancer Father   . Alcohol abuse Father   . Hypertension Maternal Grandmother   . Deep vein thrombosis Sister   . Alcohol abuse Sister   . Alcohol abuse Sister    Social History:  Social History   Social History  . Marital status: Married    Spouse name: N/A  . Number of children: N/A  . Years of education: N/A   Social History Main Topics  . Smoking status: Current Every Day Smoker    Packs/day: 1.00    Years: 40.00    Types: Cigarettes    Start date: 11/09/1970  . Smokeless tobacco: Never Used  Comment: using vabor   . Alcohol use No     Comment:   quite i2 yearts   . Drug use: No  . Sexual activity: Not Currently    Partners: Male   Other Topics Concern  . None   Social History Narrative  . None   Additional History:   Assessment:   Musculoskeletal: Strength & Muscle Tone: within normal limits Gait & Station: normal Patient leans: N/A  Psychiatric Specialty Exam: HPI  Review of Systems  Psychiatric/Behavioral: Negative for depression (States that it's still there but is improved where she is now having several good days as noted above.), hallucinations, memory loss, substance abuse and suicidal ideas. The patient is not nervous/anxious and does not have  insomnia.   All other systems reviewed and are negative.   Blood pressure 126/73, pulse 86, temperature 99 F (37.2 C), temperature source Oral, weight 194 lb 3.2 oz (88.1 kg).Body mass index is 30.42 kg/m.  General Appearance: Well Groomed  Eye Contact:  Good  Speech:  Normal Rate  Volume:  Normal  Mood:  improved  Affect:  pleasant  Thought Process:  Linear  Orientation:  Full (Time, Place, and Person)  Thought Content:  Negative  Suicidal Thoughts:  No  Homicidal Thoughts:  No  Memory:  Immediate;   Good Recent;   Good Remote;   Good  Judgement:  Good  Insight:  Good  Psychomotor Activity:  Negative  Concentration:  Good  Recall:  Good  Fund of Knowledge: Good  Language: Good  Akathisia:  Negative  Handed:    AIMS (if indicated):  Not done today  Assets:  Communication Skills Desire for Improvement Social Support  ADL's:  Intact  Cognition: WNL  Sleep:  fair   Is the patient at risk to self?  No. Has the patient been a risk to self in the past 6 months?  No. Has the patient been a risk to self within the distant past?  No. Is the patient a risk to others?  No. Has the patient been a risk to others in the past 6 months?  No. Has the patient been a risk to others within the distant past?  No.  Current Medications: Current Outpatient Prescriptions  Medication Sig Dispense Refill  . aspirin EC 81 MG tablet Take 81 mg by mouth daily.    [name] atorvastatin (LIPITOR) 10 MG tablet Take 10 mg by mouth daily at 6 PM.     . bisoprolol (ZEBETA) 5 MG tablet Take 2.5 mg by mouth at bedtime.     . celecoxib (CELEBREX) 200 MG capsule TAKE 1 CAPSULE(S) BY MOUTH DAILY FOR PAIN  2  . cetirizine (ZYRTEC) 10 MG tablet Take 10 mg by mouth daily.    . Cholecalciferol (VITAMIN D-3) 5000 UNITS TABS Take 5,000 Units by mouth daily.     . fluconazole (DIFLUCAN) 150 MG tablet TAKE 1 TABLET BY MOUTH ONCE FOR YEAST INFECTION. MAY REPEAT IN 3 DAYS FOR PERSISTENT SYMPTOMS  1  . FLUoxetine  (PROZAC) 40 MG capsule Take 2 capsules (80 mg total) by mouth daily. 60 capsule 2  . gabapentin (NEURONTIN) 600 MG tablet     . hydrochlorothiazide (HYDRODIURIL) 12.5 MG tablet TAKE 1 TABLET BY MOUTH DAILY FOR BLOOD PRESSURE/EDEMA  1  . Insulin Degludec (TRESIBA FLEXTOUCH [place]) Inject 50 Units into the skin daily after breakfast.     . meloxicam (MOBIC) 15 MG tablet     . NOVOFINE 32G X 6 MM MISC     .  NOVOLOG FLEXPEN 100 UNIT/ML FlexPen INJECT 20 UNITS SUBCUTANEOUSLY TWICE A DAY AND 15 UNITS ONCE A DAY PRIOR TO MEALS FOR DIABETES  3  . omeprazole (PRILOSEC OTC) 20 MG tablet Take 40 mg by mouth daily.    Glory Rosebush DELICA LANCETS FINE MISC CHECK BLOOD SUGAR THREE TIMES A DAY  5  . ONETOUCH VERIO test strip CHECK BLOOD SUGAR THREE TIMES A DAY  5  . tiZANidine (ZANAFLEX) 4 MG tablet TAKE 1 TABLET BY MOUTH TWICE A DAY AS NEEDED FOR MUSCLE PAIN AND SPASMS  2  . traMADol (ULTRAM) 50 MG tablet     . VIBERZI 75 MG TABS Take 1 tablet by mouth 2 (two) times daily.  2   No current facility-administered medications for this visit.     Medical Decision Making:  Established Problem, Worsening (2), Review of Medication Regimen & Side Effects (2) and Review of New Medication or Change in Dosage (2)  Treatment Plan Summary:Medication management and Plan   Patient's symptoms are consistent with major depressive disorder, moderate recurrent.   Continue Prozac to 80 mg daily   Diabetes patient's hemoglobin A1c is  down 7.2  Patient will follow up in 3 months. She can call with any questions before that.   Mc Bloodworth 05/07/2016, 3:08 PM

## 2016-08-01 DIAGNOSIS — F331 Major depressive disorder, recurrent, moderate: Secondary | ICD-10-CM | POA: Diagnosis not present

## 2016-08-01 DIAGNOSIS — E1165 Type 2 diabetes mellitus with hyperglycemia: Secondary | ICD-10-CM | POA: Diagnosis not present

## 2016-08-01 DIAGNOSIS — R110 Nausea: Secondary | ICD-10-CM | POA: Diagnosis not present

## 2016-08-01 DIAGNOSIS — M545 Low back pain: Secondary | ICD-10-CM | POA: Diagnosis not present

## 2016-08-01 DIAGNOSIS — I10 Essential (primary) hypertension: Secondary | ICD-10-CM | POA: Diagnosis not present

## 2016-08-01 DIAGNOSIS — K580 Irritable bowel syndrome with diarrhea: Secondary | ICD-10-CM | POA: Diagnosis not present

## 2016-08-03 ENCOUNTER — Other Ambulatory Visit: Payer: Self-pay | Admitting: Psychiatry

## 2016-08-07 ENCOUNTER — Ambulatory Visit: Payer: Medicare Other | Admitting: Psychiatry

## 2016-08-11 ENCOUNTER — Ambulatory Visit: Payer: Medicare Other | Admitting: Psychiatry

## 2016-08-13 ENCOUNTER — Other Ambulatory Visit (HOSPITAL_COMMUNITY): Payer: Self-pay | Admitting: Psychiatry

## 2016-08-13 ENCOUNTER — Telehealth: Payer: Self-pay

## 2016-08-13 MED ORDER — FLUOXETINE HCL 40 MG PO CAPS: 80.0000 mg | ORAL_CAPSULE | Freq: Every day | ORAL | 2 refills | Status: DC

## 2016-08-13 NOTE — Telephone Encounter (Signed)
Refills sent to pharmacy. 

## 2016-08-13 NOTE — Telephone Encounter (Signed)
received a fax requesting a refill on the prozac.40mg  pt was seen on  08-11-16. no follow up appt made.

## 2016-11-20 ENCOUNTER — Other Ambulatory Visit (HOSPITAL_COMMUNITY): Payer: Self-pay | Admitting: Psychiatry

## 2016-11-27 DIAGNOSIS — Z23 Encounter for immunization: Secondary | ICD-10-CM | POA: Diagnosis not present

## 2016-11-27 DIAGNOSIS — K580 Irritable bowel syndrome with diarrhea: Secondary | ICD-10-CM | POA: Diagnosis not present

## 2016-11-27 DIAGNOSIS — F331 Major depressive disorder, recurrent, moderate: Secondary | ICD-10-CM | POA: Diagnosis not present

## 2016-11-27 DIAGNOSIS — M545 Low back pain: Secondary | ICD-10-CM | POA: Diagnosis not present

## 2016-11-27 DIAGNOSIS — F17210 Nicotine dependence, cigarettes, uncomplicated: Secondary | ICD-10-CM | POA: Diagnosis not present

## 2016-11-27 DIAGNOSIS — I10 Essential (primary) hypertension: Secondary | ICD-10-CM | POA: Diagnosis not present

## 2016-11-27 DIAGNOSIS — E1140 Type 2 diabetes mellitus with diabetic neuropathy, unspecified: Secondary | ICD-10-CM | POA: Diagnosis not present

## 2017-03-02 ENCOUNTER — Encounter: Payer: Self-pay | Admitting: Nurse Practitioner

## 2017-03-02 ENCOUNTER — Ambulatory Visit: Payer: Medicare Other | Admitting: Nurse Practitioner

## 2017-03-02 VITALS — BP 140/80 | HR 76 | Resp 16 | Ht 67.0 in | Wt 188.2 lb

## 2017-03-02 DIAGNOSIS — E1165 Type 2 diabetes mellitus with hyperglycemia: Secondary | ICD-10-CM

## 2017-03-02 DIAGNOSIS — Z1231 Encounter for screening mammogram for malignant neoplasm of breast: Secondary | ICD-10-CM | POA: Diagnosis not present

## 2017-03-02 DIAGNOSIS — Z1239 Encounter for other screening for malignant neoplasm of breast: Secondary | ICD-10-CM

## 2017-03-02 DIAGNOSIS — I10 Essential (primary) hypertension: Secondary | ICD-10-CM | POA: Insufficient documentation

## 2017-03-02 DIAGNOSIS — M064 Inflammatory polyarthropathy: Secondary | ICD-10-CM | POA: Diagnosis not present

## 2017-03-02 DIAGNOSIS — K580 Irritable bowel syndrome with diarrhea: Secondary | ICD-10-CM

## 2017-03-02 DIAGNOSIS — E1159 Type 2 diabetes mellitus with other circulatory complications: Secondary | ICD-10-CM | POA: Insufficient documentation

## 2017-03-02 LAB — POCT GLYCOSYLATED HEMOGLOBIN (HGB A1C): Hemoglobin A1C: 8.6

## 2017-03-02 MED ORDER — TRAMADOL HCL 50 MG PO TABS: 50.0000 mg | ORAL_TABLET | Freq: Three times a day (TID) | ORAL | 3 refills | Status: DC | PRN

## 2017-03-02 MED ORDER — RIFAXIMIN 550 MG PO TABS: 550.0000 mg | ORAL_TABLET | Freq: Three times a day (TID) | ORAL | 1 refills | Status: DC

## 2017-03-02 NOTE — Progress Notes (Signed)
[facility] [place], [place] [address]  Internal MEDICINE  Office Visit Note  Patient Name: Morgan Daniels  [id number]  [id number]  Date of Service: 03/02/2017  No chief complaint on file.   Diabetes  No MedicAlert identification noted. Onset time: multpile years  Her disease course has been improving. Hypoglycemia symptoms include headaches and nervousness/anxiousness. Associated symptoms include fatigue. Pertinent negatives for diabetes include no chest pain. Symptoms are improving. Diabetic complications include autonomic neuropathy, a CVA and peripheral neuropathy. Risk factors for coronary artery disease include diabetes mellitus, family history and tobacco exposure. Current diabetic treatment includes diet, oral agent (dual therapy) and insulin injections. She is compliant with treatment most of the time. Her weight is stable. She is following a diabetic and generally healthy diet. When asked about meal planning, she reported none. She has not had a previous visit with a dietitian. She participates in exercise intermittently. Her home blood glucose trend is decreasing steadily. An ACE inhibitor/angiotensin II receptor blocker is being taken. She does not see a podiatrist. Hypertension  This is a chronic problem. The current episode started more than 1 year ago. The problem is unchanged. The problem is controlled. Associated symptoms include anxiety, headaches and malaise/fatigue. Pertinent negatives include no chest pain, palpitations or shortness of breath. There are no associated agents to hypertension. Risk factors for coronary artery disease include diabetes mellitus, dyslipidemia, smoking/tobacco exposure and stress. The current treatment provides moderate improvement. Compliance problems include diet and psychosocial issues.  Hypertensive end-organ damage includes CVA.  Depression       The patient presents with depression.  This is a chronic problem.  The  current episode started more than 1 year ago.   The onset quality is gradual.   The problem occurs constantly.  The problem has been gradually improving since onset.  Associated symptoms include fatigue, myalgias and headaches.  Associated symptoms include no suicidal ideas.     The symptoms are aggravated by family issues.  Past treatments include SNRIs - Serotonin and norepinephrine reuptake inhibitors and TCAs - Tricyclic antidepressants.  Compliance with treatment is good.  Previous treatment provided moderate relief.  Risk factors include stress.   Past medical history includes chronic fatigue syndrome, fibromyalgia, anxiety and depression.     Pertinent negatives include no suicide attempts.   Pt is here for routine follow up.    Current Medication: Outpatient Encounter Medications as of 03/02/2017  Medication Sig Note  . bisoprolol (ZEBETA) 5 MG tablet Take 2.5 mg by mouth at bedtime.    . cetirizine (ZYRTEC) 10 MG tablet Take 10 mg by mouth daily.   . Cholecalciferol (VITAMIN D-3) 5000 UNITS TABS Take 5,000 Units by mouth daily.    . fluconazole (DIFLUCAN) 150 MG tablet TAKE 1 TABLET BY MOUTH ONCE FOR YEAST INFECTION. MAY REPEAT IN 3 DAYS FOR PERSISTENT SYMPTOMS 11/09/2014: Received from: External Pharmacy  . FLUoxetine (PROZAC) 40 MG capsule Take 2 capsules (80 mg total) by mouth daily.   [name] gabapentin (NEURONTIN) 600 MG tablet    . hydrochlorothiazide (HYDRODIURIL) 12.5 MG tablet TAKE 1 TABLET BY MOUTH DAILY FOR BLOOD PRESSURE/EDEMA 12/07/2014: Received from: External Pharmacy  . Insulin Glargine-Lixisenatide (SOLIQUA) 100-33 UNT-MCG/ML SOPN Inject 45 Units into the skin. INJECT 45 UNITS SQ DAILY   . meloxicam (MOBIC) 15 MG tablet meloxicam 15 mg tablet  TAKE 1 TABLET(S) EVERY DAY BY ORAL ROUTE.   [name] NOVOFINE 32G X 6 MM MISC  07/06/2013: Received from: External Pharmacy  .  NOVOLOG FLEXPEN 100 UNIT/ML FlexPen INJECT 20 UNITS SUBCUTANEOUSLY TWICE A DAY AND 15 UNITS ONCE A DAY PRIOR TO MEALS  FOR DIABETES 02/22/2015: Received from: External Pharmacy  . omeprazole (PRILOSEC OTC) 20 MG tablet Take 40 mg by mouth daily.   Glory Rosebush DELICA LANCETS FINE MISC CHECK BLOOD SUGAR THREE TIMES A DAY 11/09/2014: Received from: External Pharmacy  . tiZANidine (ZANAFLEX) 4 MG tablet TAKE 1 TABLET BY MOUTH TWICE A DAY AS NEEDED FOR MUSCLE PAIN AND SPASMS 11/09/2014: Received from: External Pharmacy  . traMADol (ULTRAM) 50 MG tablet    . VIBERZI 75 MG TABS Take 1 tablet by mouth 2 (two) times daily. 06/27/2015: Received from: External Pharmacy  . aspirin EC 81 MG tablet Take 81 mg by mouth daily.   [name] atorvastatin (LIPITOR) 10 MG tablet Take 10 mg by mouth daily at 6 PM.    . Insulin Degludec (TRESIBA FLEXTOUCH ) Inject 50 Units into the skin daily after breakfast.    . meloxicam (MOBIC) 15 MG tablet    . ONETOUCH VERIO test strip CHECK BLOOD SUGAR THREE TIMES A DAY 11/09/2014: Received from: External Pharmacy  . [DISCONTINUED] celecoxib (CELEBREX) 200 MG capsule TAKE 1 CAPSULE(S) BY MOUTH DAILY FOR PAIN 11/09/2014: Received from: External Pharmacy   No facility-administered encounter medications on file as of 03/02/2017.     Surgical History: Past Surgical History:  Procedure Laterality Date  . ABDOMINAL HYSTERECTOMY  1990  . BACK SURGERY  2000, 2004  . CAROTID ENDARTERECTOMY Left 05/06/12  . CARPAL TUNNEL RELEASE Left 07/18/2015   Procedure: CARPAL TUNNEL RELEASE;  Surgeon: Earnestine Leys, MD;  Location: [facility] ORS;  Service: Orthopedics;  Laterality: Left;  . CEREBRAL ANGIOGRAM Bilateral 05/03/2012   Procedure: CEREBRAL ANGIOGRAM;  Surgeon: Angelia Mould, MD;  Location: [facility] CATH LAB;  Service: Cardiovascular;  Laterality: Bilateral;  . CHOLECYSTECTOMY  2001  . ENDARTERECTOMY Left 05/06/2012   Procedure: ENDARTERECTOMY CAROTID;  Surgeon: Angelia Mould, MD;  Location: [place];  Service: Vascular;  Laterality: Left;  . ENDARTERECTOMY Right 08/09/2013   Procedure: ENDARTERECTOMY  CAROTID-RIGHT;  Surgeon: Angelia Mould, MD;  Location: [place];  Service: Vascular;  Laterality: Right;  . HERNIA REPAIR    . PATCH ANGIOPLASTY Left 05/06/2012   Procedure: WITH DACRON PATCH ANGIOPLASTY ;  Surgeon: Angelia Mould, MD;  Location: [place];  Service: Vascular;  Laterality: Left;  . [place]  2004  . TONSILLECTOMY    . TUBAL LIGATION      Medical History: Past Medical History:  Diagnosis Date  . Anxiety   . Arthritis   . Carotid artery occlusion   . Chronic kidney disease May 2017   UTI  . Depression   . Diabetes ([place])   . Diverticulosis   . Fatty liver   . Fibromyalgia   . GERD (gastroesophageal reflux disease)   . H/O hiatal hernia   . Hypertension   . IBS (irritable bowel syndrome)   . Peptic ulcer   . Plantar fasciitis   . Stroke [facility]) Dec. 14,2013   Right side-ministroke    Family History: Family History  Problem Relation Age of Onset  . Hypertension Mother   . Hyperlipidemia Mother   . Deep vein thrombosis Mother   . Cancer Father   . Alcohol abuse Father   . Hypertension Maternal Grandmother   . Deep vein thrombosis Sister   . Alcohol abuse Sister   . Alcohol abuse Sister     Social History  Socioeconomic History  . Marital status: Married    Spouse name: Not on file  . Number of children: Not on file  . Years of education: Not on file  . Highest education level: Not on file  Social Needs  . Financial resource strain: Not on file  . Food insecurity - worry: Not on file  . Food insecurity - inability: Not on file  . Transportation needs - medical: Not on file  . Transportation needs - non-medical: Not on file  Occupational History  . Not on file  Tobacco Use  . Smoking status: Current Every Day Smoker    Packs/day: 1.00    Years: 40.00    Pack years: 40.00    Types: Cigarettes    Start date: 11/09/1970  . Smokeless tobacco: Never Used  . Tobacco comment: using vabor   Substance and Sexual Activity  . Alcohol  use: No    Alcohol/week: 0.0 oz    Comment:   quite i2 yearts   . Drug use: No  . Sexual activity: Not Currently    Partners: Male  Other Topics Concern  . Not on file  Social History Narrative  . Not on file      Review of Systems  Constitutional: Positive for fatigue and malaise/fatigue.  HENT: Negative for congestion, sinus pressure and sneezing.   Eyes: Negative.   Respiratory: Negative for cough, chest tightness and shortness of breath.   Cardiovascular: Negative for chest pain and palpitations.  Gastrointestinal: Positive for diarrhea. Negative for nausea and vomiting.  Endocrine:       Improving blood sugars  Genitourinary: Negative.   Musculoskeletal: Positive for myalgias.  Allergic/Immunologic: Positive for environmental allergies.  Neurological: Positive for headaches.  Hematological: Negative.   Psychiatric/Behavioral: Positive for depression. Negative for suicidal ideas. The patient is nervous/anxious.        Controlled depression.    Today's Vitals   03/02/17 1132  BP: 140/80  Pulse: 76  Resp: 16  SpO2: 98%  Weight: 188 lb 3.2 oz (85.4 kg)  Height: 5\' 7"  (1.702 m)    Physical Exam  Constitutional: She is oriented to person, place, and time. She appears well-developed and well-nourished.  HENT:  Head: Normocephalic and atraumatic.  Eyes: EOM are normal. Pupils are equal, round, and reactive to light.  Neck: Normal range of motion. Neck supple. No JVD present. No thyromegaly present.  Cardiovascular: Normal rate, regular rhythm and normal heart sounds.  Pulmonary/Chest: Effort normal and breath sounds normal. No respiratory distress. She has no wheezes.  Abdominal: Soft. Bowel sounds are normal. There is no tenderness.  Musculoskeletal: Normal range of motion.  Neurological: She is alert and oriented to person, place, and time.  Skin: Skin is warm and dry.  Psychiatric: She has a normal mood and affect.  Nursing note and vitals  reviewed.    Assessment/Plan:   ICD-10-CM   1. Uncontrolled type 2 diabetes mellitus with hyperglycemia (HCC) E11.65 POCT HgB A1C  2. Essential hypertension I10   3. 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 Irritable bowel syndrome with diarrhea K58.0 rifaximin (XIFAXAN) 550 MG TABS tablet  5. Screening for breast cancer Z12.31 MM Digital Screening   1. HgbA1c 8.6 today, dow from 11.2 at last check. No changes to diabetic medications today. Reviewed ADA diet and recommend introduction of regular exercise into routine.  2. bp stable. Continue bp medications as prescribed  3. May continue tramadol 50mg  up to TID as needed  for pain. A new prescription was sent to her pharmacy.  4. Will try xifaxin 550mg  three times daily for 2 weeks. Will repeat dose in course of 6 months if needed. Will continue vibersi 75mg  twice daily if needed.  5. Mammogram ordered today.   She should return for health maintenance exam in 3 months. She should follow up sooner if needed.   General Counseling: liandra mendia understanding of the findings of todays visit and agrees with plan of treatment. I have discussed any further diagnostic evaluation that may be needed or ordered today. We also reviewed her medications today. she has been encouraged to call the office with any questions or concerns that should arise related to todays visit.   This patient was seen by Leretha Pol, FNP- C in Collaboration with Dr Lavera Guise as a part of collaborative care agreement    Orders Placed This Encounter  Procedures  . POCT HgB A1C    Time spent: 25 Minutes   Dr Lavera Guise Internal medicine

## 2017-03-19 ENCOUNTER — Other Ambulatory Visit: Payer: Self-pay

## 2017-03-19 MED ORDER — MELOXICAM 15 MG PO TABS: 15.0000 mg | ORAL_TABLET | Freq: Every day | ORAL | 3 refills | Status: DC

## 2017-04-10 ENCOUNTER — Other Ambulatory Visit: Payer: Self-pay

## 2017-04-10 MED ORDER — INSULIN GLARGINE-LIXISENATIDE 100-33 UNT-MCG/ML ~~LOC~~ SOPN: 45.0000 [IU] | PEN_INJECTOR | Freq: Every day | SUBCUTANEOUS | 5 refills | Status: DC

## 2017-05-18 ENCOUNTER — Other Ambulatory Visit: Payer: Self-pay | Admitting: Internal Medicine

## 2017-05-18 MED ORDER — FLUOXETINE HCL 40 MG PO CAPS: 80.0000 mg | ORAL_CAPSULE | Freq: Every day | ORAL | 2 refills | Status: DC

## 2017-06-11 ENCOUNTER — Ambulatory Visit: Payer: Medicare Other | Admitting: Nurse Practitioner

## 2017-06-11 ENCOUNTER — Encounter: Payer: Self-pay | Admitting: Nurse Practitioner

## 2017-06-11 VITALS — BP 122/67 | HR 87 | Resp 16 | Ht 67.0 in | Wt 190.4 lb

## 2017-06-11 DIAGNOSIS — E782 Mixed hyperlipidemia: Secondary | ICD-10-CM | POA: Diagnosis not present

## 2017-06-11 DIAGNOSIS — R300 Dysuria: Secondary | ICD-10-CM | POA: Diagnosis not present

## 2017-06-11 DIAGNOSIS — Z0001 Encounter for general adult medical examination with abnormal findings: Secondary | ICD-10-CM | POA: Diagnosis not present

## 2017-06-11 DIAGNOSIS — E039 Hypothyroidism, unspecified: Secondary | ICD-10-CM | POA: Diagnosis not present

## 2017-06-11 DIAGNOSIS — E11649 Type 2 diabetes mellitus with hypoglycemia without coma: Secondary | ICD-10-CM | POA: Diagnosis not present

## 2017-06-11 DIAGNOSIS — I10 Essential (primary) hypertension: Secondary | ICD-10-CM

## 2017-06-11 DIAGNOSIS — E559 Vitamin D deficiency, unspecified: Secondary | ICD-10-CM | POA: Diagnosis not present

## 2017-06-11 DIAGNOSIS — E1142 Type 2 diabetes mellitus with diabetic polyneuropathy: Secondary | ICD-10-CM | POA: Diagnosis not present

## 2017-06-11 DIAGNOSIS — Z794 Long term (current) use of insulin: Secondary | ICD-10-CM | POA: Insufficient documentation

## 2017-06-11 LAB — POCT GLYCOSYLATED HEMOGLOBIN (HGB A1C): HEMOGLOBIN A1C: 9.7

## 2017-06-11 MED ORDER — INSULIN GLARGINE-LIXISENATIDE 100-33 UNT-MCG/ML ~~LOC~~ SOPN: 50.0000 [IU] | PEN_INJECTOR | Freq: Every day | SUBCUTANEOUS | 5 refills | Status: DC

## 2017-06-11 MED ORDER — GABAPENTIN 600 MG PO TABS: 600.0000 mg | ORAL_TABLET | Freq: Three times a day (TID) | ORAL | 3 refills | Status: DC

## 2017-06-11 NOTE — Progress Notes (Signed)
[facility] [place], [place] [address]  Internal MEDICINE  Office Visit Note  Patient Name: Morgan Daniels  [id number]  [id number]  Date of Service: 06/11/2017    Pt is here for routine health maintenance examination  Chief Complaint  Patient presents with  . Annual Exam    concerned about burning sensation in her thighs and side  . Diabetes    making poor diet choices recently      Patient states that she is really not doing well with her diet. Has been cooking for mother and father, who have both been ill. Making the food they request. Eating these choices, even though they are high in carbs and sweets. Not really checking her blood sugars. Feels as though they are probably elevated.  Has had some burning in the top part of right leg. Feels like it's on fire. Has been present for about the last month.   Diabetes  She presents for her follow-up diabetic visit. She has type 2 diabetes mellitus. No MedicAlert identification noted. Her disease course has been worsening. Hypoglycemia symptoms include headaches and nervousness/anxiousness. Pertinent negatives for hypoglycemia include no dizziness. Associated symptoms include fatigue, polydipsia, polyuria and visual change. Pertinent negatives for diabetes include no chest pain and no weakness. There are no hypoglycemic complications. Symptoms are worsening. Diabetic complications include a CVA, peripheral neuropathy and PVD. Risk factors for coronary artery disease include diabetes mellitus, dyslipidemia, family history, hypertension, post-menopausal, stress and tobacco exposure. Current diabetic treatment includes insulin injections. She is compliant with treatment most of the time. Her weight is increasing steadily. When asked about meal planning, she reported none. She has not had a previous visit with a dietitian. She rarely participates in exercise. Her home blood glucose trend is increasing steadily. An ACE  inhibitor/angiotensin II receptor blocker is not being taken. She does not see a podiatrist.Eye exam is current.    Current Medication: Outpatient Encounter Medications as of 06/11/2017  Medication Sig Note  . aspirin EC 81 MG tablet Take 81 mg by mouth daily.   [name] atorvastatin (LIPITOR) 10 MG tablet Take 10 mg by mouth daily at 6 PM.    . bisoprolol (ZEBETA) 5 MG tablet Take 2.5 mg by mouth at bedtime.    . cetirizine (ZYRTEC) 10 MG tablet Take 10 mg by mouth daily.   . Cholecalciferol (VITAMIN D-3) 5000 UNITS TABS Take 5,000 Units by mouth daily.    . fluconazole (DIFLUCAN) 150 MG tablet TAKE 1 TABLET BY MOUTH ONCE FOR YEAST INFECTION. MAY REPEAT IN 3 DAYS FOR PERSISTENT SYMPTOMS 11/09/2014: Received from: External Pharmacy  . FLUoxetine (PROZAC) 40 MG capsule Take 2 capsules (80 mg total) by mouth daily.   [name] gabapentin (NEURONTIN) 600 MG tablet Take 1 tablet (600 mg total) by mouth 3 (three) times daily.   . hydrochlorothiazide (HYDRODIURIL) 12.5 MG tablet TAKE 1 TABLET BY MOUTH DAILY FOR BLOOD PRESSURE/EDEMA 12/07/2014: Received from: External Pharmacy  . Insulin Glargine-Lixisenatide (SOLIQUA) 100-33 UNT-MCG/ML SOPN Inject 50 Units into the skin daily.   . meloxicam (MOBIC) 15 MG tablet Take 1 tablet (15 mg total) by mouth daily.   [name] NOVOFINE 32G X 6 MM MISC  07/06/2013: Received from: External Pharmacy  . NOVOLOG FLEXPEN 100 UNIT/ML FlexPen INJECT 20 UNITS SUBCUTANEOUSLY TWICE A DAY AND 15 UNITS ONCE A DAY PRIOR TO MEALS FOR DIABETES 02/22/2015: Received from: External Pharmacy  . omeprazole (PRILOSEC OTC) 20 MG tablet Take 40 mg by mouth daily.   [name]  ONETOUCH DELICA LANCETS FINE MISC CHECK BLOOD SUGAR THREE TIMES A DAY 11/09/2014: Received from: External Pharmacy  . ONETOUCH VERIO test strip CHECK BLOOD SUGAR THREE TIMES A DAY 11/09/2014: Received from: External Pharmacy  . rifaximin (XIFAXAN) 550 MG TABS tablet Take 1 tablet (550 mg total) by mouth 3 (three) times daily.   [name] tiZANidine  (ZANAFLEX) 4 MG tablet TAKE 1 TABLET BY MOUTH TWICE A DAY AS NEEDED FOR MUSCLE PAIN AND SPASMS 11/09/2014: Received from: External Pharmacy  . traMADol (ULTRAM) 50 MG tablet Take 1 tablet (50 mg total) by mouth 3 (three) times daily as needed for moderate pain.   [name] VIBERZI 75 MG TABS Take 1 tablet by mouth 2 (two) times daily. 06/27/2015: Received from: External Pharmacy  . [DISCONTINUED] gabapentin (NEURONTIN) 600 MG tablet    . [DISCONTINUED] Insulin Degludec (TRESIBA FLEXTOUCH [place]) Inject 50 Units into the skin daily after breakfast.    . [DISCONTINUED] Insulin Glargine-Lixisenatide (SOLIQUA) 100-33 UNT-MCG/ML SOPN Inject 45 Units into the skin daily. INJECT 45 UNITS SQ DAILY    No facility-administered encounter medications on file as of 06/11/2017.     Surgical History: Past Surgical History:  Procedure Laterality Date  . ABDOMINAL HYSTERECTOMY  1990  . BACK SURGERY  2000, 2004  . CAROTID ENDARTERECTOMY Left 05/06/12  . CARPAL TUNNEL RELEASE Left 07/18/2015   Procedure: CARPAL TUNNEL RELEASE;  Surgeon: Earnestine Leys, MD;  Location: [facility] ORS;  Service: Orthopedics;  Laterality: Left;  . CEREBRAL ANGIOGRAM Bilateral 05/03/2012   Procedure: CEREBRAL ANGIOGRAM;  Surgeon: Angelia Mould, MD;  Location: [facility] CATH LAB;  Service: Cardiovascular;  Laterality: Bilateral;  . CHOLECYSTECTOMY  2001  . ENDARTERECTOMY Left 05/06/2012   Procedure: ENDARTERECTOMY CAROTID;  Surgeon: Angelia Mould, MD;  Location: [place];  Service: Vascular;  Laterality: Left;  . ENDARTERECTOMY Right 08/09/2013   Procedure: ENDARTERECTOMY CAROTID-RIGHT;  Surgeon: Angelia Mould, MD;  Location: [place];  Service: Vascular;  Laterality: Right;  . HERNIA REPAIR    . PATCH ANGIOPLASTY Left 05/06/2012   Procedure: WITH DACRON PATCH ANGIOPLASTY ;  Surgeon: Angelia Mould, MD;  Location: [place];  Service: Vascular;  Laterality: Left;  . [place]  2004  . TONSILLECTOMY    . TUBAL LIGATION      Medical  History: Past Medical History:  Diagnosis Date  . Anxiety   . Arthritis   . Carotid artery occlusion   . Chronic kidney disease May 2017   UTI  . Depression   . Diabetes ([place])   . Diverticulosis   . Fatty liver   . Fibromyalgia   . GERD (gastroesophageal reflux disease)   . H/O hiatal hernia   . Hypertension   . IBS (irritable bowel syndrome)   . Peptic ulcer   . Plantar fasciitis   . Stroke [facility]) Dec. 14,2013   Right side-ministroke    Family History: Family History  Problem Relation Age of Onset  . Hypertension Mother   . Hyperlipidemia Mother   . Deep vein thrombosis Mother   . Cancer Father   . Alcohol abuse Father   . Hypertension Maternal Grandmother   . Deep vein thrombosis Sister   . Alcohol abuse Sister   . Alcohol abuse Sister       Review of Systems  Constitutional: Positive for fatigue. Negative for activity change and unexpected weight change.  HENT: Negative for congestion, postnasal drip, rhinorrhea, sneezing and sore throat.   Eyes: Negative.   Respiratory: Negative for cough,  chest tightness, shortness of breath and wheezing.   Cardiovascular: Negative for chest pain, palpitations and leg swelling.  Gastrointestinal: Positive for diarrhea. Negative for abdominal pain, constipation, nausea and vomiting.  Endocrine: Positive for polydipsia and polyuria.       Blood sugars running high.   Genitourinary: Negative for dysuria, flank pain, frequency and urgency.  Musculoskeletal: Positive for arthralgias, back pain and myalgias.       Pain in both feet.  Skin: Negative for rash.  Allergic/Immunologic: Positive for environmental allergies.  Neurological: Positive for headaches. Negative for dizziness, weakness and numbness.  Hematological: Negative.   Psychiatric/Behavioral: Positive for dysphoric mood. Negative for suicidal ideas. The patient is nervous/anxious.        Controlled depression.     Today's Vitals   06/11/17 1132  BP: 122/67   Pulse: 87  Resp: 16  SpO2: 96%  Weight: 190 lb 6.4 oz (86.4 kg)  Height: 5\' 7"  (1.702 m)    Physical Exam  Constitutional: She is oriented to person, place, and time. She appears well-developed and well-nourished.  HENT:  Head: Normocephalic and atraumatic.  Eyes: Pupils are equal, round, and reactive to light. Conjunctivae and EOM are normal.  Neck: Normal range of motion. Neck supple. No JVD present. Carotid bruit is not present. No thyromegaly present.  Cardiovascular: Normal rate, regular rhythm, normal heart sounds and intact distal pulses.  Pulses:      Dorsalis pedis pulses are 2+ on the right side, and 2+ on the left side.       Posterior tibial pulses are 2+ on the right side, and 2+ on the left side.  Pulmonary/Chest: Effort normal and breath sounds normal. No respiratory distress. She has no wheezes.  Abdominal: Soft. Bowel sounds are normal. There is no tenderness.  Musculoskeletal: Normal range of motion.       Right foot: There is deformity. There is normal range of motion.       Left foot: There is deformity. There is normal range of motion.  Feet:  Right Foot:  Protective Sensation: 6 sites tested. 6 sites sensed.  Skin Integrity: Positive for dry skin. Negative for ulcer, blister, skin breakdown, erythema, warmth or callus.  Left Foot:  Protective Sensation: 6 sites tested. 6 sites sensed.  Skin Integrity: Positive for callus and dry skin. Negative for ulcer, blister, skin breakdown, erythema or warmth.  Lymphadenopathy:    She has no cervical adenopathy.  Neurological: She is alert and oriented to person, place, and time. No cranial nerve deficit.  Skin: Skin is warm and dry. Capillary refill takes 2 to 3 seconds.  Psychiatric: She has a normal mood and affect. Her behavior is normal. Judgment and thought content normal.  Nursing note and vitals reviewed.    LABS: Recent Results (from the past 2160 hour(s))  POCT HgB A1C     Status: None   Collection Time:  06/11/17 11:50 AM  Result Value Ref Range   Hemoglobin A1C 9.7     Assessment/Plan: 1. Encounter for general adult medical examination with abnormal findings Annual health maintenance exam today - CBC with Differential/Platelet - Comprehensive metabolic panel - Microalbumin / creatinine urine ratio  2. Type 2 diabetes mellitus with peripheral neuropathy (HCC) - POCT HgB A1C 9.7 today. Increased soliqua to 50 units daily. Reviewed sliding scale insulin and instructions for use. Strongly encouraged her to follow ADA diet.  - Insulin Glargine-Lixisenatide (SOLIQUA) 100-33 UNT-MCG/ML SOPN; Inject 50 Units into the skin daily.  Dispense: 5  pen; Refill: 5 - CBC with Differential/Platelet - Comprehensive metabolic panel - Microalbumin / creatinine urine ratio  3. Essential hypertension Well controlled.   4. Vitamin D deficiency - Vitamin D 1,25 dihydroxy  5. Mixed hyperlipidemia - Lipid panel  6. Acquired hypothyroidism - TSH - T4, free  7. Dysuria - Urinalysis, Routine w reflex microscopic   General Counseling: noble bodie understanding of the findings of todays visit and agrees with plan of treatment. I have discussed any further diagnostic evaluation that may be needed or ordered today. We also reviewed her medications today. she has been encouraged to call the office with any questions or concerns that should arise related to todays visit.  Diabetes Counseling:  1. Addition of ACE inh/ ARB'S for nephroprotection. 2. Diabetic foot care, prevention of complications.  3.Exercise and lose weight.  4. Diabetic eye examination, 5. Monitor blood sugar closlely. nutrition counseling.  6.Sign and symptoms of hypoglycemia including shaking sweating,confusion and headaches.  This patient was seen by Leretha Pol, FNP- C in Collaboration with Dr Lavera Guise as a part of collaborative care agreement    Orders Placed This Encounter  Procedures  . Urinalysis, Routine w  reflex microscopic  . CBC with Differential/Platelet  . Comprehensive metabolic panel  . TSH  . T4, free  . Lipid panel  . Vitamin D 1,25 dihydroxy  . Microalbumin / creatinine urine ratio  . POCT HgB A1C    Meds ordered this encounter  Medications  . Insulin Glargine-Lixisenatide (SOLIQUA) 100-33 UNT-MCG/ML SOPN    Sig: Inject 50 Units into the skin daily.    Dispense:  5 pen    Refill:  5    Order Specific Question:   Supervising Provider    Answer:   Lavera Guise [[id number]]  . gabapentin (NEURONTIN) 600 MG tablet    Sig: Take 1 tablet (600 mg total) by mouth 3 (three) times daily.    Dispense:  90 tablet    Refill:  3    Please note increased dosing    Order Specific Question:   Supervising Provider    Answer:   Lavera Guise [[id number]]    Time spent: [place], MD  Internal Medicine

## 2017-06-26 ENCOUNTER — Other Ambulatory Visit: Payer: Self-pay | Admitting: Internal Medicine

## 2017-06-26 ENCOUNTER — Other Ambulatory Visit: Payer: Self-pay

## 2017-06-26 MED ORDER — FLUCONAZOLE 150 MG PO TABS: ORAL_TABLET | ORAL | 1 refills | Status: DC

## 2017-08-15 ENCOUNTER — Other Ambulatory Visit: Payer: Self-pay | Admitting: Internal Medicine

## 2017-09-09 ENCOUNTER — Other Ambulatory Visit: Payer: Self-pay

## 2017-09-09 MED ORDER — FLUOXETINE HCL 40 MG PO CAPS: 80.0000 mg | ORAL_CAPSULE | Freq: Every day | ORAL | 1 refills | Status: DC

## 2017-09-15 ENCOUNTER — Other Ambulatory Visit: Payer: Self-pay

## 2017-09-15 DIAGNOSIS — M064 Inflammatory polyarthropathy: Secondary | ICD-10-CM

## 2017-09-15 MED ORDER — TRAMADOL HCL 50 MG PO TABS: 50.0000 mg | ORAL_TABLET | Freq: Three times a day (TID) | ORAL | 0 refills | Status: DC | PRN

## 2017-09-15 NOTE — Telephone Encounter (Signed)
CALLED IN TRAMADOL # [id number] NO REFILLS.

## 2017-09-21 ENCOUNTER — Ambulatory Visit (INDEPENDENT_AMBULATORY_CARE_PROVIDER_SITE_OTHER): Payer: Medicare Other | Admitting: Nurse Practitioner

## 2017-09-21 ENCOUNTER — Encounter: Payer: Self-pay | Admitting: Nurse Practitioner

## 2017-09-21 ENCOUNTER — Telehealth: Payer: Self-pay | Admitting: Nurse Practitioner

## 2017-09-21 VITALS — BP 158/76 | HR 89 | Resp 16 | Ht 67.0 in | Wt 191.0 lb

## 2017-09-21 DIAGNOSIS — F321 Major depressive disorder, single episode, moderate: Secondary | ICD-10-CM

## 2017-09-21 DIAGNOSIS — E1142 Type 2 diabetes mellitus with diabetic polyneuropathy: Secondary | ICD-10-CM

## 2017-09-21 DIAGNOSIS — M064 Inflammatory polyarthropathy: Secondary | ICD-10-CM

## 2017-09-21 DIAGNOSIS — E782 Mixed hyperlipidemia: Secondary | ICD-10-CM | POA: Diagnosis not present

## 2017-09-21 DIAGNOSIS — I10 Essential (primary) hypertension: Secondary | ICD-10-CM | POA: Diagnosis not present

## 2017-09-21 DIAGNOSIS — E1165 Type 2 diabetes mellitus with hyperglycemia: Secondary | ICD-10-CM | POA: Diagnosis not present

## 2017-09-21 MED ORDER — BISOPROLOL FUMARATE 5 MG PO TABS: 2.5000 mg | ORAL_TABLET | Freq: Every day | ORAL | 5 refills | Status: DC

## 2017-09-21 MED ORDER — METOPROLOL SUCCINATE ER 50 MG PO TB24: 50.0000 mg | ORAL_TABLET | Freq: Every day | ORAL | 5 refills | Status: DC

## 2017-09-21 MED ORDER — TRAMADOL HCL 50 MG PO TABS: 50.0000 mg | ORAL_TABLET | Freq: Three times a day (TID) | ORAL | 2 refills | Status: DC | PRN

## 2017-09-21 MED ORDER — GABAPENTIN 600 MG PO TABS: 600.0000 mg | ORAL_TABLET | Freq: Three times a day (TID) | ORAL | 3 refills | Status: DC

## 2017-09-21 MED ORDER — MELOXICAM 15 MG PO TABS: 15.0000 mg | ORAL_TABLET | Freq: Every day | ORAL | 3 refills | Status: DC

## 2017-09-21 MED ORDER — HYDROCHLOROTHIAZIDE 12.5 MG PO TABS
ORAL_TABLET | ORAL | 5 refills | Status: DC
Start: 2017-09-21 — End: 2017-10-20

## 2017-09-21 MED ORDER — INSULIN ASPART 100 UNIT/ML ~~LOC~~ SOLN: SUBCUTANEOUS | 99 refills | Status: DC

## 2017-09-21 MED ORDER — FLUOXETINE HCL 40 MG PO CAPS: 80.0000 mg | ORAL_CAPSULE | Freq: Every day | ORAL | 5 refills | Status: DC

## 2017-09-21 NOTE — Progress Notes (Signed)
[facility] [place], [place] [address]  Internal MEDICINE  Office Visit Note  Patient Name: Morgan Daniels  [id number]  [id number]  Date of Service: 09/29/2017  Chief Complaint  Patient presents with  . Diabetes    3 month follow up    Diabetes  She presents for her follow-up diabetic visit. She has type 2 diabetes mellitus. Her disease course has been worsening. Hypoglycemia symptoms include headaches and nervousness/anxiousness. Pertinent negatives for hypoglycemia include no dizziness. Associated symptoms include fatigue, polydipsia, polyuria and weakness. Pertinent negatives for diabetes include no chest pain. There are no hypoglycemic complications. Symptoms are worsening. Diabetic complications include autonomic neuropathy, a CVA and peripheral neuropathy. Risk factors for coronary artery disease include dyslipidemia, family history, hypertension, post-menopausal, tobacco exposure and stress. Current diabetic treatment includes insulin injections. She is compliant with treatment some of the time. Her weight is stable. She is following a high fat/cholesterol diet. When asked about meal planning, she reported none. She has had a previous visit with a dietitian. She participates in exercise intermittently. Her home blood glucose trend is decreasing steadily. An ACE inhibitor/angiotensin II receptor blocker is not being taken. She does not see a podiatrist.Eye exam is not current.       Current Medication: Outpatient Encounter Medications as of 09/21/2017  Medication Sig Note  . aspirin EC 81 MG tablet Take 81 mg by mouth daily.   [name] atorvastatin (LIPITOR) 10 MG tablet Take 10 mg by mouth daily at 6 PM.    . cetirizine (ZYRTEC) 10 MG tablet Take 10 mg by mouth daily.   . Cholecalciferol (VITAMIN D-3) 5000 UNITS TABS Take 5,000 Units by mouth daily.    . fluconazole (DIFLUCAN) 150 MG tablet TAKE 1 TABLET BY MOUTH ONCE FOR YEAST INFECTION. MAY REPEAT IN 3 DAYS FOR  PERSISTENT SYMPTOMS   . FLUoxetine (PROZAC) 40 MG capsule Take 2 capsules (80 mg total) by mouth daily.   [name] gabapentin (NEURONTIN) 600 MG tablet Take 1 tablet (600 mg total) by mouth 3 (three) times daily.   . hydrochlorothiazide (HYDRODIURIL) 12.5 MG tablet TAKE 1 TABLET BY MOUTH DAILY FOR BLOOD PRESSURE/EDEMA   . Insulin Glargine-Lixisenatide (SOLIQUA) 100-33 UNT-MCG/ML SOPN Inject 50 Units into the skin daily.   . meloxicam (MOBIC) 15 MG tablet Take 1 tablet (15 mg total) by mouth daily.   [name] NOVOFINE 32G X 6 MM MISC  07/06/2013: Received from: External Pharmacy  . omeprazole (PRILOSEC OTC) 20 MG tablet Take 40 mg by mouth daily.   Glory Rosebush DELICA LANCETS FINE MISC CHECK BLOOD SUGAR THREE TIMES A DAY 11/09/2014: Received from: External Pharmacy  . ONETOUCH VERIO test strip CHECK BLOOD SUGAR THREE TIMES A DAY 11/09/2014: Received from: External Pharmacy  . rifaximin (XIFAXAN) 550 MG TABS tablet Take 1 tablet (550 mg total) by mouth 3 (three) times daily.   [name] tiZANidine (ZANAFLEX) 4 MG tablet TAKE 1 TABLET(S) BY MOUTH TWICE A DAY AS NEEDED FOR MUSCLE PAIN AND SPASMS   . traMADol (ULTRAM) 50 MG tablet Take 1 tablet (50 mg total) by mouth 3 (three) times daily as needed for moderate pain.   [name] VIBERZI 75 MG TABS Take 1 tablet by mouth 2 (two) times daily. 06/27/2015: Received from: External Pharmacy  . [DISCONTINUED] bisoprolol (ZEBETA) 5 MG tablet Take 2.5 mg by mouth at bedtime.    . [DISCONTINUED] bisoprolol (ZEBETA) 5 MG tablet Take 0.5 tablets (2.5 mg total) by mouth at bedtime.   . [DISCONTINUED] FLUoxetine (  PROZAC) 40 MG capsule Take 2 capsules (80 mg total) by mouth daily.   . [DISCONTINUED] gabapentin (NEURONTIN) 600 MG tablet Take 1 tablet (600 mg total) by mouth 3 (three) times daily.   . [DISCONTINUED] hydrochlorothiazide (HYDRODIURIL) 12.5 MG tablet TAKE 1 TABLET BY MOUTH DAILY FOR BLOOD PRESSURE/EDEMA 12/07/2014: Received from: External Pharmacy  . [DISCONTINUED] meloxicam (MOBIC) 15 MG  tablet Take 1 tablet (15 mg total) by mouth daily.   . [DISCONTINUED] NOVOLOG FLEXPEN 100 UNIT/ML FlexPen INJECT 20 UNITS SUBCUTANEOUSLY TWICE A DAY AND 15 UNITS ONCE A DAY PRIOR TO MEALS FOR DIABETES 02/22/2015: Received from: External Pharmacy  . [DISCONTINUED] traMADol (ULTRAM) 50 MG tablet Take 1 tablet (50 mg total) by mouth 3 (three) times daily as needed for moderate pain.   [name] insulin aspart (NOVOLOG) 100 UNIT/ML injection Sliding scale insulin BID prior to meals. Instructions provided.   . metoprolol succinate (TOPROL-XL) 50 MG 24 hr tablet Take 1 tablet (50 mg total) by mouth daily. Take with or immediately following a meal.    No facility-administered encounter medications on file as of 09/21/2017.     Surgical History: Past Surgical History:  Procedure Laterality Date  . ABDOMINAL HYSTERECTOMY  1990  . BACK SURGERY  2000, 2004  . CAROTID ENDARTERECTOMY Left 05/06/12  . CARPAL TUNNEL RELEASE Left 07/18/2015   Procedure: CARPAL TUNNEL RELEASE;  Surgeon: Earnestine Leys, MD;  Location: [facility] ORS;  Service: Orthopedics;  Laterality: Left;  . CEREBRAL ANGIOGRAM Bilateral 05/03/2012   Procedure: CEREBRAL ANGIOGRAM;  Surgeon: Angelia Mould, MD;  Location: [facility] CATH LAB;  Service: Cardiovascular;  Laterality: Bilateral;  . CHOLECYSTECTOMY  2001  . ENDARTERECTOMY Left 05/06/2012   Procedure: ENDARTERECTOMY CAROTID;  Surgeon: Angelia Mould, MD;  Location: [place];  Service: Vascular;  Laterality: Left;  . ENDARTERECTOMY Right 08/09/2013   Procedure: ENDARTERECTOMY CAROTID-RIGHT;  Surgeon: Angelia Mould, MD;  Location: [place];  Service: Vascular;  Laterality: Right;  . HERNIA REPAIR    . PATCH ANGIOPLASTY Left 05/06/2012   Procedure: WITH DACRON PATCH ANGIOPLASTY ;  Surgeon: Angelia Mould, MD;  Location: [place];  Service: Vascular;  Laterality: Left;  . [place]  2004  . TONSILLECTOMY    . TUBAL LIGATION      Medical History: Past Medical History:  Diagnosis Date   . Anxiety   . Arthritis   . Carotid artery occlusion   . Chronic kidney disease May 2017   UTI  . Depression   . Diabetes ([place])   . Diverticulosis   . Fatty liver   . Fibromyalgia   . GERD (gastroesophageal reflux disease)   . H/O hiatal hernia   . Hypertension   . IBS (irritable bowel syndrome)   . Peptic ulcer   . Plantar fasciitis   . Stroke [facility]) Dec. 14,2013   Right side-ministroke    Family History: Family History  Problem Relation Age of Onset  . Hypertension Mother   . Hyperlipidemia Mother   . Deep vein thrombosis Mother   . Cancer Father   . Alcohol abuse Father   . Hypertension Maternal Grandmother   . Deep vein thrombosis Sister   . Alcohol abuse Sister   . Alcohol abuse Sister     Social History   Socioeconomic History  . Marital status: Married    Spouse name: Not on file  . Number of children: Not on file  . Years of education: Not on file  . Highest education level: Not on  file  Occupational History  . Not on file  Social Needs  . Financial resource strain: Not on file  . Food insecurity:    Worry: Not on file    Inability: Not on file  . Transportation needs:    Medical: Not on file    Non-medical: Not on file  Tobacco Use  . Smoking status: Current Every Day Smoker    Packs/day: 1.00    Years: 40.00    Pack years: 40.00    Types: Cigarettes, E-cigarettes    Start date: 11/09/1970  . Smokeless tobacco: Never Used  . Tobacco comment: using vapor   Substance and Sexual Activity  . Alcohol use: No    Alcohol/week: 0.0 standard drinks    Comment:   quite 2 years   . Drug use: No  . Sexual activity: Not Currently    Partners: Male  Lifestyle  . Physical activity:    Days per week: Not on file    Minutes per session: Not on file  . Stress: Not on file  Relationships  . Social connections:    Talks on phone: Not on file    Gets together: Not on file    Attends religious service: Not on file    Active member of club or  organization: Not on file    Attends meetings of clubs or organizations: Not on file    Relationship status: Not on file  . Intimate partner violence:    Fear of current or ex partner: Not on file    Emotionally abused: Not on file    Physically abused: Not on file    Forced sexual activity: Not on file  Other Topics Concern  . Not on file  Social History Narrative  . Not on file      Review of Systems  Constitutional: Positive for fatigue. Negative for activity change, chills and unexpected weight change.  HENT: Negative for congestion, postnasal drip, rhinorrhea, sneezing and sore throat.   Eyes: Negative.   Respiratory: Negative for cough, chest tightness, shortness of breath and wheezing.   Cardiovascular: Negative for chest pain, palpitations and leg swelling.       Elevated blood pressure today.   Gastrointestinal: Positive for diarrhea. Negative for abdominal pain, constipation, nausea and vomiting.  Endocrine: Positive for polydipsia and polyuria.       Blood sugars running high.   Genitourinary: Positive for frequency. Negative for dysuria, flank pain and urgency.  Musculoskeletal: Positive for arthralgias, back pain and myalgias.       Pain in both feet.  Skin: Negative for rash.  Allergic/Immunologic: Positive for environmental allergies.  Neurological: Positive for weakness and headaches. Negative for dizziness and numbness.  Hematological: Negative for adenopathy.  Psychiatric/Behavioral: Positive for dysphoric mood. Negative for suicidal ideas. The patient is nervous/anxious.        Controlled depression.   Today's Vitals   09/21/17 1228  BP: (!) 158/76  Pulse: 89  Resp: 16  SpO2: 91%  Weight: 191 lb (86.6 kg)  Height: 5\' 7"  (1.702 m)    Physical Exam  Constitutional: She is oriented to person, place, and time. She appears well-developed and well-nourished.  HENT:  Head: Normocephalic and atraumatic.  Nose: Nose normal.  Mouth/Throat: Oropharynx is  clear and moist.  Eyes: Pupils are equal, round, and reactive to light. Conjunctivae and EOM are normal.  Neck: Normal range of motion. Neck supple. No JVD present. Carotid bruit is not present. No tracheal deviation present. No  thyromegaly present.  Cardiovascular: Normal rate, regular rhythm and normal heart sounds.  Pulses:      Dorsalis pedis pulses are 2+ on the right side, and 2+ on the left side.       Posterior tibial pulses are 2+ on the right side, and 2+ on the left side.  Pulmonary/Chest: Effort normal and breath sounds normal. No respiratory distress. She has no wheezes.  Abdominal: Soft. Bowel sounds are increased. There is no tenderness.  Musculoskeletal: Normal range of motion.       Right foot: There is deformity. There is normal range of motion.       Left foot: There is deformity. There is normal range of motion.  Feet:  Right Foot:  Protective Sensation: 6 sites tested. 6 sites sensed.  Skin Integrity: Positive for dry skin. Negative for ulcer, blister, skin breakdown, erythema, warmth or callus.  Left Foot:  Protective Sensation: 6 sites tested. 6 sites sensed.  Skin Integrity: Positive for callus and dry skin. Negative for ulcer, blister, skin breakdown, erythema or warmth.  Lymphadenopathy:    She has no cervical adenopathy.  Neurological: She is alert and oriented to person, place, and time. No cranial nerve deficit.  Skin: Skin is warm and dry. Capillary refill takes 2 to 3 seconds.  Psychiatric: Her speech is normal and behavior is normal. Judgment and thought content normal. Her mood appears anxious. Cognition and memory are normal. She exhibits a depressed mood.  Nursing note and vitals reviewed.   Assessment/Plan: 1. Uncontrolled type 2 diabetes mellitus with hyperglycemia (HCC) - POCT HgB A1C 11.7 today. New instructions provided for patient to give herself sliding scale injections of regular insulin to cover post-prandial blod sugars. Advised her to do this  dosing prior to meals and prior to snacks which are high in sugar and carbs. She should continue to take basal insulin 50 units daily. Discussed importance of taking all medications as prescribed. Reviewed risk factos associated with uncontrolled diabetes, especially when there is preexisting carotid artery disease and history of stroke.  - insulin aspart (NOVOLOG) 100 UNIT/ML injection; Sliding scale insulin BID prior to meals. Instructions provided.  Dispense: 3 vial; Refill: PRN  2. Inflammatory polyarthritis (HCC) Meloxicam 15mg  daily to control pain and inflammation. Tramadol 50mg  may be taken up to three times daily if needed for more severe pain. New rx sent for both  - meloxicam (MOBIC) 15 MG tablet; Take 1 tablet (15 mg total) by mouth daily.  Dispense: 30 tablet; Refill: 3 - traMADol (ULTRAM) 50 MG tablet; Take 1 tablet (50 mg total) by mouth 3 (three) times daily as needed for moderate pain.  Dispense: 90 tablet; Refill: 2  3. Essential hypertension - hydrochlorothiazide (HYDRODIURIL) 12.5 MG tablet; TAKE 1 TABLET BY MOUTH DAILY FOR BLOOD PRESSURE/EDEMA  Dispense: 30 tablet; Refill: 5 - metoprolol succinate (TOPROL-XL) 50 MG 24 hr tablet; Take 1 tablet (50 mg total) by mouth daily. Take with or immediately following a meal.  Dispense: 30 tablet; Refill: 5  4. Depression, major, single episode, moderate (HCC) - FLUoxetine (PROZAC) 40 MG capsule; Take 2 capsules (80 mg total) by mouth daily.  Dispense: 60 capsule; Refill: 5  5. Mixed hyperlipidemia Continue atorvastatin as prescribed     General Counseling: Morgan Daniels understanding of the findings of todays visit and agrees with plan of treatment. I have discussed any further diagnostic evaluation that may be needed or ordered today. We also reviewed her medications today. she has been encouraged to call  the office with any questions or concerns that should arise related to todays visit.  Diabetes Counseling:  1. Addition of ACE  inh/ ARB'S for nephroprotection. Microalbumin is updated  2. Diabetic foot care, prevention of complications. Podiatry consult 3. Exercise and lose weight.  4. Diabetic eye examination, Diabetic eye exam is updated  5. Monitor blood sugar closlely. nutrition counseling.  6. Sign and symptoms of hypoglycemia including shaking sweating,confusion and headaches.   Counseling: Adherence of Medical Therapy: The patient understands that it is the responsibility of the patient to complete all prescribed medications, all recommended testing, including but not limited to, laboratory studies and imaging. The patient further understands the need to keep all scheduled follow-up visits and to inform the office immediately of any changes in their medical condition. The patient understands that the success of treatment in large part depends on the patient's willingness to complete the therapeutic regimen and to work in partnership with the designated health-care providers.   This patient was seen by Leretha Pol FNP Collaboration with Dr Lavera Guise as a part of collaborative care agreement  Orders Placed This Encounter  Procedures  . POCT HgB A1C    Meds ordered this encounter  Medications  . meloxicam (MOBIC) 15 MG tablet    Sig: Take 1 tablet (15 mg total) by mouth daily.    Dispense:  30 tablet    Refill:  3    Order Specific Question:   Supervising Provider    Answer:   Lavera Guise [[id number]]  . FLUoxetine (PROZAC) 40 MG capsule    Sig: Take 2 capsules (80 mg total) by mouth daily.    Dispense:  60 capsule    Refill:  5    Order Specific Question:   Supervising Provider    Answer:   Lavera Guise [[id number]]  . DISCONTD: bisoprolol (ZEBETA) 5 MG tablet    Sig: Take 0.5 tablets (2.5 mg total) by mouth at bedtime.    Dispense:  15 tablet    Refill:  5    Order Specific Question:   Supervising Provider    Answer:   Lavera Guise [[id number]]  . hydrochlorothiazide (HYDRODIURIL) 12.5 MG tablet    Sig:  TAKE 1 TABLET BY MOUTH DAILY FOR BLOOD PRESSURE/EDEMA    Dispense:  30 tablet    Refill:  5    Order Specific Question:   Supervising Provider    Answer:   Lavera Guise [[id number]]  . traMADol (ULTRAM) 50 MG tablet    Sig: Take 1 tablet (50 mg total) by mouth 3 (three) times daily as needed for moderate pain.    Dispense:  90 tablet    Refill:  2    Confirmed with patient no allergy to tramadol    Order Specific Question:   Supervising Provider    Answer:   Lavera Guise [[id number]]  . gabapentin (NEURONTIN) 600 MG tablet    Sig: Take 1 tablet (600 mg total) by mouth 3 (three) times daily.    Dispense:  90 tablet    Refill:  3    Please note increased dosing    Order Specific Question:   Supervising Provider    Answer:   Lavera Guise [[id number]]  . insulin aspart (NOVOLOG) 100 UNIT/ML injection    Sig: Sliding scale insulin BID prior to meals. Instructions provided.    Dispense:  3 vial    Refill:  PRN    humalog sample provided today  Order Specific Question:   Supervising Provider    Answer:   Lavera Guise [[id number]]  . metoprolol succinate (TOPROL-XL) 50 MG 24 hr tablet    Sig: Take 1 tablet (50 mg total) by mouth daily. Take with or immediately following a meal.    Dispense:  30 tablet    Refill:  5    D/c bisoprolol due to back order    Order Specific Question:   Supervising Provider    Answer:   Lavera Guise [[id number]]    Time spent: 25 Minutes      Dr Lavera Guise Internal medicine

## 2017-09-21 NOTE — Telephone Encounter (Signed)
I d/c bisoprolol and changed to metprolol ER 50mg . I already sent new rx to her pharmacy. Thanks.

## 2017-09-21 NOTE — Telephone Encounter (Signed)
Pharmacy is requesting a alternative medication to bisoprolol fumarate 5mg  it is on indefinate backorder

## 2017-09-29 DIAGNOSIS — F321 Major depressive disorder, single episode, moderate: Secondary | ICD-10-CM | POA: Insufficient documentation

## 2017-09-29 DIAGNOSIS — E1165 Type 2 diabetes mellitus with hyperglycemia: Secondary | ICD-10-CM | POA: Insufficient documentation

## 2017-09-29 DIAGNOSIS — M064 Inflammatory polyarthropathy: Secondary | ICD-10-CM | POA: Insufficient documentation

## 2017-09-29 LAB — POCT GLYCOSYLATED HEMOGLOBIN (HGB A1C): Hemoglobin A1C: 11.7 % — AB (ref 4.0–5.6)

## 2017-10-20 ENCOUNTER — Other Ambulatory Visit: Payer: Self-pay

## 2017-10-20 DIAGNOSIS — I10 Essential (primary) hypertension: Secondary | ICD-10-CM

## 2017-10-20 MED ORDER — HYDROCHLOROTHIAZIDE 12.5 MG PO TABS: ORAL_TABLET | ORAL | 5 refills | Status: DC

## 2017-10-27 ENCOUNTER — Other Ambulatory Visit: Payer: Self-pay

## 2017-10-27 DIAGNOSIS — E1165 Type 2 diabetes mellitus with hyperglycemia: Secondary | ICD-10-CM

## 2017-10-27 MED ORDER — INSULIN PEN NEEDLE 32G X 6 MM MISC: 11 refills | Status: DC

## 2017-11-18 ENCOUNTER — Other Ambulatory Visit: Payer: Self-pay | Admitting: Internal Medicine

## 2017-12-29 ENCOUNTER — Other Ambulatory Visit: Payer: Self-pay | Admitting: Nurse Practitioner

## 2017-12-29 ENCOUNTER — Ambulatory Visit (INDEPENDENT_AMBULATORY_CARE_PROVIDER_SITE_OTHER): Payer: Medicare Other | Admitting: Nurse Practitioner

## 2017-12-29 ENCOUNTER — Encounter: Payer: Self-pay | Admitting: Nurse Practitioner

## 2017-12-29 VITALS — BP 135/78 | HR 86 | Resp 16 | Ht 67.0 in | Wt 191.4 lb

## 2017-12-29 DIAGNOSIS — K580 Irritable bowel syndrome with diarrhea: Secondary | ICD-10-CM

## 2017-12-29 DIAGNOSIS — M064 Inflammatory polyarthropathy: Secondary | ICD-10-CM

## 2017-12-29 DIAGNOSIS — E1165 Type 2 diabetes mellitus with hyperglycemia: Secondary | ICD-10-CM

## 2017-12-29 DIAGNOSIS — I10 Essential (primary) hypertension: Secondary | ICD-10-CM

## 2017-12-29 LAB — POCT GLYCOSYLATED HEMOGLOBIN (HGB A1C): Hemoglobin A1C: 12.5 % — AB (ref 4.0–5.6)

## 2017-12-29 MED ORDER — TRAMADOL HCL 50 MG PO TABS: 50.0000 mg | ORAL_TABLET | Freq: Three times a day (TID) | ORAL | 2 refills | Status: DC | PRN

## 2017-12-29 MED ORDER — INSULIN DEGLUDEC 100 UNIT/ML ~~LOC~~ SOPN: 60.0000 [IU] | PEN_INJECTOR | Freq: Every day | SUBCUTANEOUS | 5 refills | Status: DC

## 2017-12-29 MED ORDER — FREESTYLE LIBRE 14 DAY SENSOR MISC: 1.0000 [IU] | Freq: Every day | 5 refills | Status: DC

## 2017-12-29 MED ORDER — SEMAGLUTIDE(0.25 OR 0.5MG/DOS) 2 MG/1.5ML ~~LOC~~ SOPN: 0.5000 mg | PEN_INJECTOR | SUBCUTANEOUS | 5 refills | Status: DC

## 2017-12-29 NOTE — Progress Notes (Signed)
[facility] [place], [place] [address]  Internal MEDICINE  Office Visit Note  Patient Name: Morgan Daniels  [id number]  [id number]  Date of Service: 12/29/2017  Chief Complaint  Patient presents with  . Medical Management of Chronic Issues    3 month follow up  . Diabetes  . Depression  . Quality Metric Gaps    pt will get flu vaccine from pharmacy    Patient arrives to the clinic for diabetes follow up. She currently takes care of her father and feel very stressed. She states that she checks her blood sugar in the morning and it runs in 220-240 range. She does not check blood sugar during the day and therefore does not use prescribed sliding scale insulin. She reports that she can not afford Soliqua anymore because her co-pay is very high. She ran out of the medication a couple days ago. Patient is interested in freestyle [place] 14 day system. Patient also reports that she has difficult time to stay away from sweets and does not follow diabetic diet. She hopes that using the [place] system will encourage her to have better control of her diabetes. Patient continues to have diarrhea due to IBS. She denies any blood in her stool. Patient had an episode of mild to moderate chest pain a week ago. Pain was located in upper chest and was not radiated to any other part of the body. She felt it was more related to her GI problems. Pain resolved on its own without any interventions. Patient did not do her routine labs from previous visit. She strongly encouraged to do them before next appointment.       Current Medication: Outpatient Encounter Medications as of 12/29/2017  Medication Sig Note  . aspirin EC 81 MG tablet Take 81 mg by mouth daily.   [name] atorvastatin (LIPITOR) 10 MG tablet Take 10 mg by mouth daily at 6 PM.    . cetirizine (ZYRTEC) 10 MG tablet Take 10 mg by mouth daily.   . Cholecalciferol (VITAMIN D-3) 5000 UNITS TABS Take 5,000 Units by mouth daily.    .  fluconazole (DIFLUCAN) 150 MG tablet TAKE 1 TABLET BY MOUTH ONCE FOR YEAST INFECTION. MAY REPEAT IN 3 DAYS FOR PERSISTENT SYMPTOMS   . FLUoxetine (PROZAC) 40 MG capsule Take 2 capsules (80 mg total) by mouth daily.   [name] gabapentin (NEURONTIN) 600 MG tablet Take 1 tablet (600 mg total) by mouth 3 (three) times daily.   . hydrochlorothiazide (HYDRODIURIL) 12.5 MG tablet TAKE 1 TABLET BY MOUTH DAILY FOR BLOOD PRESSURE/EDEMA   . insulin aspart (NOVOLOG) 100 UNIT/ML injection Sliding scale insulin BID prior to meals. Instructions provided.   . Insulin Glargine-Lixisenatide (SOLIQUA) 100-33 UNT-MCG/ML SOPN Inject 50 Units into the skin daily.   . Insulin Pen Needle (NOVOFINE) 32G X 6 MM MISC Use as directed with Victoza  Dx code E11.65   . meloxicam (MOBIC) 15 MG tablet Take 1 tablet (15 mg total) by mouth daily.   . metoprolol succinate (TOPROL-XL) 50 MG 24 hr tablet Take 1 tablet (50 mg total) by mouth daily. Take with or immediately following a meal.   . omeprazole (PRILOSEC OTC) 20 MG tablet Take 40 mg by mouth daily.   Glory Rosebush DELICA LANCETS FINE MISC CHECK BLOOD SUGAR THREE TIMES A DAY 11/09/2014: Received from: External Pharmacy  . ONETOUCH VERIO test strip CHECK BLOOD SUGAR THREE TIMES A DAY 11/09/2014: Received from: External Pharmacy  . tiZANidine (ZANAFLEX) 4  MG tablet TAKE 1 TABLET(S) BY MOUTH TWICE A DAY AS NEEDED FOR MUSCLE PAIN AND SPASMS   . traMADol (ULTRAM) 50 MG tablet Take 1 tablet (50 mg total) by mouth 3 (three) times daily as needed for moderate pain.   [name] VIBERZI 75 MG TABS Take 1 tablet by mouth 2 (two) times daily. 06/27/2015: Received from: External Pharmacy  . [DISCONTINUED] rifaximin (XIFAXAN) 550 MG TABS tablet Take 1 tablet (550 mg total) by mouth 3 (three) times daily.   . [DISCONTINUED] traMADol (ULTRAM) 50 MG tablet Take 1 tablet (50 mg total) by mouth 3 (three) times daily as needed for moderate pain.   . Continuous Blood Gluc Sensor (FREESTYLE LIBRE 14 DAY SENSOR) MISC 1  Units by Does not apply route daily.   . insulin degludec (TRESIBA FLEXTOUCH) 100 UNIT/ML SOPN FlexTouch Pen Inject 0.6 mLs (60 Units total) into the skin daily.   . Semaglutide,0.25 or 0.5MG /DOS, (OZEMPIC, 0.25 OR 0.5 MG/DOSE,) 2 MG/1.5ML SOPN Inject 0.5 mg into the skin once a week.    No facility-administered encounter medications on file as of 12/29/2017.     Surgical History: Past Surgical History:  Procedure Laterality Date  . ABDOMINAL HYSTERECTOMY  1990  . BACK SURGERY  2000, 2004  . CAROTID ENDARTERECTOMY Left 05/06/12  . CARPAL TUNNEL RELEASE Left 07/18/2015   Procedure: CARPAL TUNNEL RELEASE;  Surgeon: Earnestine Leys, MD;  Location: [facility] ORS;  Service: Orthopedics;  Laterality: Left;  . CEREBRAL ANGIOGRAM Bilateral 05/03/2012   Procedure: CEREBRAL ANGIOGRAM;  Surgeon: Angelia Mould, MD;  Location[facility] C[facility] LAB;  Service: Cardiovascular;  Laterality: Bilateral;  . CHOLECYSTECTOMY  2001  . ENDARTERECTOMY Left 05/06/2012   Procedure: ENDARTERECTOMY CAROTID;  Surgeon: Angelia Mould, MD;  Location: [place];  Service: Vascular;  Laterality: Left;  . ENDARTERECTOMY Right 08/09/2013   Procedure: ENDARTERECTOMY CAROTID-RIGHT;  Surgeon: Angelia Mould, MD;  Location: [place];  Service: Vascular;  Laterality: Right;  . HERNIA REPAIR    . PATCH ANGIOPLASTY Left 05/06/2012   Procedure: WITH DACRON PATCH ANGIOPLASTY ;  Surgeon: Angelia Mould, MD;  Location: [place];  Service: Vascular;  Laterality: Left;  . [place]  2004  . TONSILLECTOMY    . TUBAL LIGATION      Medical History: Past Medical History:  Diagnosis Date  . Anxiety   . Arthritis   . Carotid artery occlusion   . Chronic kidney disease May 2017   UTI  . Depression   . Diabetes ([place])   . Diverticulosis   . Fatty liver   . Fibromyalgia   . GERD (gastroesophageal reflux disease)   . H/O hiatal hernia   . Hypertension   . IBS (irritable bowel syndrome)   . Peptic ulcer   . Plantar fasciitis    . Stroke [facility]) Dec. 14,2013   Right side-ministroke    Family History: Family History  Problem Relation Age of Onset  . Hypertension Mother   . Hyperlipidemia Mother   . Deep vein thrombosis Mother   . Cancer Father   . Alcohol abuse Father   . Hypertension Maternal Grandmother   . Deep vein thrombosis Sister   . Alcohol abuse Sister   . Alcohol abuse Sister     Social History   Socioeconomic History  . Marital status: Married    Spouse name: Not on file  . Number of children: Not on file  . Years of education: Not on file  . Highest education level: Not on  file  Occupational History  . Not on file  Social Needs  . Financial resource strain: Not on file  . Food insecurity:    Worry: Not on file    Inability: Not on file  . Transportation needs:    Medical: Not on file    Non-medical: Not on file  Tobacco Use  . Smoking status: Current Every Day Smoker    Packs/day: 1.00    Years: 40.00    Pack years: 40.00    Types: Cigarettes, E-cigarettes    Start date: 11/09/1970  . Smokeless tobacco: Never Used  . Tobacco comment: using vapor   Substance and Sexual Activity  . Alcohol use: No    Alcohol/week: 0.0 standard drinks    Comment:   quite 2 years   . Drug use: No  . Sexual activity: Not Currently    Partners: Male  Lifestyle  . Physical activity:    Days per week: Not on file    Minutes per session: Not on file  . Stress: Not on file  Relationships  . Social connections:    Talks on phone: Not on file    Gets together: Not on file    Attends religious service: Not on file    Active member of club or organization: Not on file    Attends meetings of clubs or organizations: Not on file    Relationship status: Not on file  . Intimate partner violence:    Fear of current or ex partner: Not on file    Emotionally abused: Not on file    Physically abused: Not on file    Forced sexual activity: Not on file  Other Topics Concern  . Not on file  Social  History Narrative  . Not on file      Review of Systems  Constitutional: Positive for appetite change. Negative for chills, diaphoresis, fatigue, fever and unexpected weight change.  HENT: Negative for congestion, ear discharge, ear pain, facial swelling, postnasal drip, rhinorrhea, sinus pressure, sneezing, sore throat, trouble swallowing and voice change.   Respiratory: Negative for apnea, chest tightness, shortness of breath and wheezing.   Cardiovascular: Negative for chest pain, palpitations and leg swelling.  Gastrointestinal: Positive for diarrhea. Negative for abdominal pain, blood in stool, nausea and vomiting.  Endocrine: Negative for cold intolerance, heat intolerance, polydipsia, polyphagia and polyuria.       Uncontrolled blood sugars .  Musculoskeletal: Positive for arthralgias and back pain.  Allergic/Immunologic: Negative for environmental allergies.  Neurological: Negative for dizziness, tremors, facial asymmetry, weakness, light-headedness and headaches.  Hematological: Negative for adenopathy.  Psychiatric/Behavioral: Positive for dysphoric mood. The patient is nervous/anxious.        The patient states that she persistently feels overwhelmed and has an "I don't care" attitude.    Today's Vitals   12/29/17 1125  BP: 135/78  Pulse: 86  Resp: 16  SpO2: 93%  Weight: 191 lb 6.4 oz (86.8 kg)  Height: 5\' 7"  (1.702 m)    Physical Exam  Constitutional: She is oriented to person, place, and time. She appears well-developed and well-nourished.  HENT:  Head: Normocephalic and atraumatic.  Right Ear: External ear normal.  Left Ear: External ear normal.  Nose: Nose normal.  Mouth/Throat: Oropharynx is clear and moist.  Eyes: Pupils are equal, round, and reactive to light. Conjunctivae and EOM are normal. Right eye exhibits no discharge. Left eye exhibits no discharge. No scleral icterus.  Neck: Normal range of motion. Neck supple. No  tracheal deviation present. No  thyromegaly present.  Cardiovascular: Normal rate, regular rhythm and normal heart sounds.  Pulmonary/Chest: Effort normal and breath sounds normal. No stridor. No respiratory distress. She has no wheezes. She has no rales. She exhibits no tenderness.  Abdominal: Soft. Bowel sounds are normal. She exhibits distension. There is no tenderness. There is no guarding.  Musculoskeletal: She exhibits no edema, tenderness or deformity.  Chronic back and hip pain. Worse with bendig and twisting at the waist. No visible or palpable abnormalities are noted.   Lymphadenopathy:    She has no cervical adenopathy.  Neurological: She is alert and oriented to person, place, and time.  Skin: Skin is warm and dry. No rash noted. No erythema.  Psychiatric: Judgment normal.  Patient feels very stressed due to the family circumstances. She reports she does not have time to take care of herself. Patient is able to fall asleep and sleep through the night. No change in the appetite.  Nursing note and vitals reviewed.  Assessment/Plan: 1. Uncontrolled type 2 diabetes mellitus with hyperglycemia (HCC) A1c is 12.5 today. Patient is not able to afford her medications anymore. Patient switched to [place] and Ozempic. Will start tresiba at 50units daily and Ozempic 0.5mg  weekly. Patient is educated about how to use them and their side effects.  Freestyle Libre ordered. More frequent monitoring of blood sugars likely to help with overall blood sugar control.  - POCT HgB A1C - Continuous Blood Gluc Sensor (FREESTYLE LIBRE 14 DAY SENSOR) MISC; 1 Units by Does not apply route daily.  Dispense: 2 each; Refill: 5 - insulin degludec (TRESIBA FLEXTOUCH) 100 UNIT/ML SOPN FlexTouch Pen; Inject 0.6 mLs (60 Units total) into the skin daily.  Dispense: 5 pen; Refill: 5 - Semaglutide,0.25 or 0.5MG /DOS, (OZEMPIC, 0.25 OR 0.5 MG/DOSE,) 2 MG/1.5ML SOPN; Inject 0.5 mg into the skin once a week.  Dispense: 1 pen; Refill: 5  2. Essential  hypertension Stable blood pressure. Continue with current treatment  3. Irritable bowel syndrome with diarrhea Chronic diarrhea. Continues to use viberzi 75mg  twice daily if needed. Will see GI as needed and as indicated.   4. Inflammatory polyarthritis (HCC) Tramadol refilled for pain management - traMADol (ULTRAM) 50 MG tablet; Take 1 tablet (50 mg total) by mouth 3 (three) times daily as needed for moderate pain.  Dispense: 90 tablet; Refill: 2  General Counseling: Morgan Daniels verbalizes understanding of the findings of todays visit and agrees with plan of treatment. I have discussed any further diagnostic evaluation that may be needed or ordered today. We also reviewed her medications today. she has been encouraged to call the office with any questions or concerns that should arise related to todays visit.  Counseling: Adherence of Medical Therapy: The patient understands that it is the responsibility of the patient to complete all prescribed medications, all recommended testing, including but not limited to, laboratory studies and imaging. The patient further understands the need to keep all scheduled follow-up visits and to inform the office immediately of any changes in their medical condition. The patient understands that the success of treatment in large part depends on the patient's willingness to complete the therapeutic regimen and to work in partnership with the designated health-care providers.  Diabetes Counseling:  1. Addition of ACE inh/ ARB'S for nephroprotection. Microalbumin is updated  2. Diabetic foot care, prevention of complications. Podiatry consult 3. Exercise and lose weight.  4. Diabetic eye examination, Diabetic eye exam is updated  5. Monitor blood sugar closlely. nutrition counseling.  6. Sign and symptoms of hypoglycemia including shaking sweating,confusion and headaches.  This patient was seen by Leretha Pol FNP Collaboration with Dr Lavera Guise as a part of  collaborative care agreement  Orders Placed This Encounter  Procedures  . POCT HgB A1C    Meds ordered this encounter  Medications  . traMADol (ULTRAM) 50 MG tablet    Sig: Take 1 tablet (50 mg total) by mouth 3 (three) times daily as needed for moderate pain.    Dispense:  90 tablet    Refill:  2    Confirmed with patient no allergy to tramadol    Order Specific Question:   Supervising Provider    Answer:   Lavera Guise [[id number]]  . Continuous Blood Gluc Sensor (FREESTYLE LIBRE 14 DAY SENSOR) MISC    Sig: 1 Units by Does not apply route daily.    Dispense:  2 each    Refill:  5    Patient needs freestlyle Libre 14 day monitoring system and reader device. Please iclude all necessary items. Thanks    Order Specific Question:   Supervising Provider    Answer:   Lavera Guise [[id number]]  . insulin degludec (TRESIBA FLEXTOUCH) 100 UNIT/ML SOPN FlexTouch Pen    Sig: Inject 0.6 mLs (60 Units total) into the skin daily.    Dispense:  5 pen    Refill:  5    Please provide preferred alternative if needed. Thanks.    Order Specific Question:   Supervising Provider    Answer:   Lavera Guise [[id number]]  . Semaglutide,0.25 or 0.5MG /DOS, (OZEMPIC, 0.25 OR 0.5 MG/DOSE,) 2 MG/1.5ML SOPN    Sig: Inject 0.5 mg into the skin once a week.    Dispense:  1 pen    Refill:  5    Order Specific Question:   Supervising Provider    Answer:   Lavera Guise [[id number]]    Time spent: 71 Minutes      Dr Lavera Guise Internal medicine

## 2017-12-30 DIAGNOSIS — K580 Irritable bowel syndrome with diarrhea: Secondary | ICD-10-CM | POA: Insufficient documentation

## 2017-12-30 DIAGNOSIS — Z23 Encounter for immunization: Secondary | ICD-10-CM | POA: Diagnosis not present

## 2017-12-30 NOTE — Telephone Encounter (Signed)
Depending on cost, she may be willing to pay for this out of pocket. She needs to have more control over blood sugar management.

## 2017-12-30 NOTE — Telephone Encounter (Signed)
Need alternate is not covered

## 2018-01-22 ENCOUNTER — Other Ambulatory Visit: Payer: Self-pay

## 2018-01-22 DIAGNOSIS — M064 Inflammatory polyarthropathy: Secondary | ICD-10-CM

## 2018-01-22 MED ORDER — MELOXICAM 15 MG PO TABS: 15.0000 mg | ORAL_TABLET | Freq: Every day | ORAL | 3 refills | Status: DC

## 2018-02-03 ENCOUNTER — Other Ambulatory Visit: Payer: Self-pay

## 2018-02-03 MED ORDER — TIZANIDINE HCL 4 MG PO TABS: ORAL_TABLET | ORAL | 3 refills | Status: DC

## 2018-02-05 ENCOUNTER — Other Ambulatory Visit: Payer: Self-pay

## 2018-02-05 MED ORDER — FLUCONAZOLE 150 MG PO TABS: ORAL_TABLET | ORAL | 1 refills | Status: DC

## 2018-02-25 ENCOUNTER — Other Ambulatory Visit: Payer: Self-pay

## 2018-02-25 DIAGNOSIS — E1142 Type 2 diabetes mellitus with diabetic polyneuropathy: Secondary | ICD-10-CM

## 2018-02-25 MED ORDER — GABAPENTIN 600 MG PO TABS: 600.0000 mg | ORAL_TABLET | Freq: Three times a day (TID) | ORAL | 3 refills | Status: DC

## 2018-03-22 ENCOUNTER — Other Ambulatory Visit: Payer: Self-pay | Admitting: Nurse Practitioner

## 2018-03-22 DIAGNOSIS — E1142 Type 2 diabetes mellitus with diabetic polyneuropathy: Secondary | ICD-10-CM

## 2018-03-23 ENCOUNTER — Other Ambulatory Visit: Payer: Self-pay

## 2018-03-23 DIAGNOSIS — F321 Major depressive disorder, single episode, moderate: Secondary | ICD-10-CM

## 2018-03-23 MED ORDER — FLUOXETINE HCL 40 MG PO CAPS: 80.0000 mg | ORAL_CAPSULE | Freq: Every day | ORAL | 5 refills | Status: DC

## 2018-03-24 ENCOUNTER — Other Ambulatory Visit: Payer: Self-pay

## 2018-03-24 DIAGNOSIS — I10 Essential (primary) hypertension: Secondary | ICD-10-CM

## 2018-03-24 MED ORDER — METOPROLOL SUCCINATE ER 50 MG PO TB24: 50.0000 mg | ORAL_TABLET | Freq: Every day | ORAL | 5 refills | Status: DC

## 2018-03-26 ENCOUNTER — Encounter: Payer: Self-pay | Admitting: Nurse Practitioner

## 2018-03-26 ENCOUNTER — Other Ambulatory Visit
Admission: RE | Admit: 2018-03-26 | Discharge: 2018-03-26 | Disposition: A | Discharge status: Home / Self Care | Payer: Medicare Other | Attending: Nurse Practitioner | Admitting: Nurse Practitioner

## 2018-03-26 ENCOUNTER — Ambulatory Visit: Payer: Medicare Other | Admitting: Nurse Practitioner

## 2018-03-26 VITALS — BP 168/78 | HR 80 | Resp 16 | Ht 67.0 in | Wt 190.2 lb

## 2018-03-26 DIAGNOSIS — E1165 Type 2 diabetes mellitus with hyperglycemia: Secondary | ICD-10-CM | POA: Diagnosis not present

## 2018-03-26 DIAGNOSIS — K580 Irritable bowel syndrome with diarrhea: Secondary | ICD-10-CM

## 2018-03-26 DIAGNOSIS — I10 Essential (primary) hypertension: Secondary | ICD-10-CM | POA: Diagnosis not present

## 2018-03-26 DIAGNOSIS — M064 Inflammatory polyarthropathy: Secondary | ICD-10-CM | POA: Diagnosis not present

## 2018-03-26 DIAGNOSIS — E119 Type 2 diabetes mellitus without complications: Secondary | ICD-10-CM | POA: Insufficient documentation

## 2018-03-26 DIAGNOSIS — E559 Vitamin D deficiency, unspecified: Secondary | ICD-10-CM | POA: Insufficient documentation

## 2018-03-26 DIAGNOSIS — E039 Hypothyroidism, unspecified: Secondary | ICD-10-CM | POA: Diagnosis not present

## 2018-03-26 DIAGNOSIS — E782 Mixed hyperlipidemia: Secondary | ICD-10-CM | POA: Diagnosis not present

## 2018-03-26 LAB — COMPREHENSIVE METABOLIC PANEL
ALK PHOS: 52 U/L (ref 38–126)
ALT: 24 U/L (ref 0–44)
ANION GAP: 9 (ref 5–15)
AST: 23 U/L (ref 15–41)
Albumin: 4.6 g/dL (ref 3.5–5.0)
BUN: 17 mg/dL (ref 6–20)
CALCIUM: 9.9 mg/dL (ref 8.9–10.3)
CO2: 28 mmol/L (ref 22–32)
Chloride: 100 mmol/L (ref 98–111)
Creatinine, Ser: 0.51 mg/dL (ref 0.44–1.00)
GFR calc Af Amer: >60 mL/min (ref >60)
GFR calc non Af Amer: >60 mL/min (ref >60)
Glucose, Bld: 130 mg/dL — ABNORMAL HIGH (ref 70–99)
Potassium: 4.1 mmol/L (ref 3.5–5.1)
Sodium: 137 mmol/L (ref 135–145)
TOTAL PROTEIN: 8.4 g/dL — AB (ref 6.5–8.1)
Total Bilirubin: 0.7 mg/dL (ref 0.3–1.2)

## 2018-03-26 LAB — CBC
HCT: 45.4 % (ref 36.0–46.0)
Hemoglobin: 15.2 g/dL — ABNORMAL HIGH (ref 12.0–15.0)
MCH: 28.1 pg (ref 26.0–34.0)
MCHC: 33.5 g/dL (ref 30.0–36.0)
MCV: 83.9 fL (ref 80.0–100.0)
Platelets: 355 10*3/uL (ref 150–400)
RBC: 5.41 MIL/uL — ABNORMAL HIGH (ref 3.87–5.11)
RDW: 13.7 % (ref 11.5–15.5)
WBC: 10.5 10*3/uL (ref 4.0–10.5)
nRBC: 0 % (ref 0.0–0.2)

## 2018-03-26 LAB — LIPID PANEL
Cholesterol: 349 mg/dL — ABNORMAL HIGH (ref 0–200)
HDL: 38 mg/dL — ABNORMAL LOW (ref >40)
LDL Cholesterol: 235 mg/dL — ABNORMAL HIGH (ref 0–99)
Total CHOL/HDL Ratio: 9.2 RATIO
Triglycerides: 378 mg/dL — ABNORMAL HIGH (ref <150)
VLDL: 76 mg/dL — ABNORMAL HIGH (ref 0–40)

## 2018-03-26 LAB — T4, FREE: Free T4: 0.85 ng/dL (ref 0.82–1.77)

## 2018-03-26 LAB — POCT GLYCOSYLATED HEMOGLOBIN (HGB A1C): Hemoglobin A1C: 8.2 % — AB (ref 4.0–5.6)

## 2018-03-26 LAB — TSH: TSH: 0.789 u[IU]/mL (ref 0.350–4.500)

## 2018-03-26 MED ORDER — TRAMADOL HCL 50 MG PO TABS: 50.0000 mg | ORAL_TABLET | Freq: Three times a day (TID) | ORAL | 2 refills | Status: DC | PRN

## 2018-03-26 NOTE — Progress Notes (Signed)
[facility] [place], [place] [address]  Internal MEDICINE  Office Visit Note  Patient Name: Morgan Daniels  [id number]  [id number]  Date of Service: 03/31/2018  Chief Complaint  Patient presents with  . Medical Management of Chronic Issues    3 month follow up, diabetic medication concerns  . Diabetes    Patient arrives to the clinic for diabetes follow up. She currently takes care of her father and feel very stressed. Blood sugars have improved some since her last visit .was started on ozempic 0.5mg  weekly and is taking basal insulin at 60 units every day. She does use sliding scale insulin prior to meals. She states that she has been feeling better. She has a little more energy and feels less depressed. She also reports improvement in IBSD symptoms. She did run out of the sample ozempic two weeks ago and has not had it since, and she can already tell the negative difference. Patient also reports that she has difficult time to stay away from sweets and does not follow diabetic diet.  Patient did not do her routine labs from previous visit. She strongly encouraged to do them before next appointment.       Current Medication: Outpatient Encounter Medications as of 03/26/2018  Medication Sig Note  . aspirin EC 81 MG tablet Take 81 mg by mouth daily.   [name] atorvastatin (LIPITOR) 10 MG tablet Take 10 mg by mouth daily at 6 PM.    . cetirizine (ZYRTEC) 10 MG tablet Take 10 mg by mouth daily.   . Cholecalciferol (VITAMIN D-3) 5000 UNITS TABS Take 5,000 Units by mouth daily.    . Continuous Blood Gluc Sensor (FREESTYLE LIBRE 14 DAY SENSOR) MISC USE AS DIRECTED   . fluconazole (DIFLUCAN) 150 MG tablet TAKE 1 TABLET BY MOUTH ONCE FOR YEAST INFECTION. MAY REPEAT IN 3 DAYS FOR PERSISTENT SYMPTOMS   . FLUoxetine (PROZAC) 40 MG capsule Take 2 capsules (80 mg total) by mouth daily.   [name] gabapentin (NEURONTIN) 600 MG tablet TAKE 1 TABLET BY MOUTH THREE TIMES A DAY. *DOSE  INCREASE*   . hydrochlorothiazide (HYDRODIURIL) 12.5 MG tablet TAKE 1 TABLET BY MOUTH DAILY FOR BLOOD PRESSURE/EDEMA   . insulin aspart (NOVOLOG) 100 UNIT/ML injection Sliding scale insulin BID prior to meals. Instructions provided.   . insulin degludec (TRESIBA FLEXTOUCH) 100 UNIT/ML SOPN FlexTouch Pen Inject 0.6 mLs (60 Units total) into the skin daily.   . Insulin Pen Needle (NOVOFINE) 32G X 6 MM MISC Use as directed with Victoza  Dx code E11.65   . meloxicam (MOBIC) 15 MG tablet Take 1 tablet (15 mg total) by mouth daily.   . metoprolol succinate (TOPROL-XL) 50 MG 24 hr tablet Take 1 tablet (50 mg total) by mouth daily. Take with or immediately following a meal.   . omeprazole (PRILOSEC OTC) 20 MG tablet Take 40 mg by mouth daily.   Glory Rosebush DELICA LANCETS FINE MISC CHECK BLOOD SUGAR THREE TIMES A DAY 11/09/2014: Received from: External Pharmacy  . ONETOUCH VERIO test strip CHECK BLOOD SUGAR THREE TIMES A DAY 11/09/2014: Received from: External Pharmacy  . Semaglutide,0.25 or 0.5MG /DOS, (OZEMPIC, 0.25 OR 0.5 MG/DOSE,) 2 MG/1.5ML SOPN Inject 0.5 mg into the skin once a week.   [name] tiZANidine (ZANAFLEX) 4 MG tablet TAKE 1 TABLET(S) BY MOUTH TWICE A DAY AS NEEDED FOR MUSCLE PAIN AND SPASMS   . traMADol (ULTRAM) 50 MG tablet Take 1 tablet (50 mg total) by mouth 3 (three)  times daily as needed for moderate pain.   [name] VIBERZI 75 MG TABS Take 1 tablet by mouth 2 (two) times daily. 06/27/2015: Received from: External Pharmacy  . [DISCONTINUED] Insulin Glargine-Lixisenatide (SOLIQUA) 100-33 UNT-MCG/ML SOPN Inject 50 Units into the skin daily. 03/26/2018: now on tresiba and oxempic  . [DISCONTINUED] traMADol (ULTRAM) 50 MG tablet Take 1 tablet (50 mg total) by mouth 3 (three) times daily as needed for moderate pain.    No facility-administered encounter medications on file as of 03/26/2018.     Surgical History: Past Surgical History:  Procedure Laterality Date  . ABDOMINAL HYSTERECTOMY  1990  . BACK  SURGERY  2000, 2004  . CAROTID ENDARTERECTOMY Left 05/06/12  . CARPAL TUNNEL RELEASE Left 07/18/2015   Procedure: CARPAL TUNNEL RELEASE;  Surgeon: Earnestine Leys, MD;  Location: [facility] ORS;  Service: Orthopedics;  Laterality: Left;  . CEREBRAL ANGIOGRAM Bilateral 05/03/2012   Procedure: CEREBRAL ANGIOGRAM;  Surgeon: Angelia Mould, MD;  Location: [facility] CATH LAB;  Service: Cardiovascular;  Laterality: Bilateral;  . CHOLECYSTECTOMY  2001  . ENDARTERECTOMY Left 05/06/2012   Procedure: ENDARTERECTOMY CAROTID;  Surgeon: Angelia Mould, MD;  Location: [place];  Service: Vascular;  Laterality: Left;  . ENDARTERECTOMY Right 08/09/2013   Procedure: ENDARTERECTOMY CAROTID-RIGHT;  Surgeon: Angelia Mould, MD;  Location: [place];  Service: Vascular;  Laterality: Right;  . HERNIA REPAIR    . PATCH ANGIOPLASTY Left 05/06/2012   Procedure: WITH DACRON PATCH ANGIOPLASTY ;  Surgeon: Angelia Mould, MD;  Location: [place];  Service: Vascular;  Laterality: Left;  . [place]  2004  . TONSILLECTOMY    . TUBAL LIGATION      Medical History: Past Medical History:  Diagnosis Date  . Anxiety   . Arthritis   . Carotid artery occlusion   . Chronic kidney disease May 2017   UTI  . Depression   . Diabetes ([place])   . Diverticulosis   . Fatty liver   . Fibromyalgia   . GERD (gastroesophageal reflux disease)   . H/O hiatal hernia   . Hypertension   . IBS (irritable bowel syndrome)   . Peptic ulcer   . Plantar fasciitis   . Stroke [facility]) Dec. 14,2013   Right side-ministroke    Family History: Family History  Problem Relation Age of Onset  . Hypertension Mother   . Hyperlipidemia Mother   . Deep vein thrombosis Mother   . Cancer Father   . Alcohol abuse Father   . Hypertension Maternal Grandmother   . Deep vein thrombosis Sister   . Alcohol abuse Sister   . Alcohol abuse Sister     Social History   Socioeconomic History  . Marital status: Married    Spouse name: Not on file  .  Number of children: Not on file  . Years of education: Not on file  . Highest education level: Not on file  Occupational History  . Not on file  Social Needs  . Financial resource strain: Not on file  . Food insecurity:    Worry: Not on file    Inability: Not on file  . Transportation needs:    Medical: Not on file    Non-medical: Not on file  Tobacco Use  . Smoking status: Current Every Day Smoker    Packs/day: 1.00    Years: 40.00    Pack years: 40.00    Types: Cigarettes, E-cigarettes    Start date: 11/09/1970  . Smokeless tobacco: Never Used  .  Tobacco comment: using vapor   Substance and Sexual Activity  . Alcohol use: No    Alcohol/week: 0.0 standard drinks    Comment:   quite 2 years   . Drug use: No  . Sexual activity: Not Currently    Partners: Male  Lifestyle  . Physical activity:    Days per week: Not on file    Minutes per session: Not on file  . Stress: Not on file  Relationships  . Social connections:    Talks on phone: Not on file    Gets together: Not on file    Attends religious service: Not on file    Active member of club or organization: Not on file    Attends meetings of clubs or organizations: Not on file    Relationship status: Not on file  . Intimate partner violence:    Fear of current or ex partner: Not on file    Emotionally abused: Not on file    Physically abused: Not on file    Forced sexual activity: Not on file  Other Topics Concern  . Not on file  Social History Narrative  . Not on file      Review of Systems  Constitutional: Negative for appetite change, chills, diaphoresis, fatigue, fever and unexpected weight change.  HENT: Negative for congestion, ear discharge, ear pain, facial swelling, postnasal drip, rhinorrhea, sinus pressure, sneezing, sore throat, trouble swallowing and voice change.   Respiratory: Negative for apnea, chest tightness, shortness of breath and wheezing.   Cardiovascular: Negative for chest pain and  palpitations.       Elevated blood pressure today.  Gastrointestinal: Positive for diarrhea. Negative for abdominal pain, blood in stool, nausea and vomiting.       Improved. Less frequent and less severe.   Endocrine: Negative for cold intolerance, heat intolerance and polyuria.       Improved blood sugars.   Musculoskeletal: Positive for arthralgias and back pain.  Allergic/Immunologic: Negative for environmental allergies.  Neurological: Negative for dizziness, tremors, facial asymmetry, weakness, light-headedness and headaches.  Hematological: Negative for adenopathy.  Psychiatric/Behavioral: Positive for dysphoric mood. The patient is nervous/anxious.        The patient states that she persistently feels overwhelmed and has an "I don't care" attitude.    Today's Vitals   03/26/18 1118  BP: (!) 168/78  Pulse: 80  Resp: 16  SpO2: 93%  Weight: 190 lb 3.2 oz (86.3 kg)  Height: 5\' 7"  (1.702 m)   Body mass index is 29.79 kg/m.  Physical Exam Vitals signs and nursing note reviewed.  Constitutional:      Appearance: Normal appearance. She is well-developed.  HENT:     Head: Normocephalic and atraumatic.     Right Ear: External ear normal.     Left Ear: External ear normal.     Nose: Nose normal.  Eyes:     General: No scleral icterus.       Right eye: No discharge.        Left eye: No discharge.     Conjunctiva/sclera: Conjunctivae normal.     Pupils: Pupils are equal, round, and reactive to light.  Neck:     Musculoskeletal: Normal range of motion and neck supple.     Thyroid: No thyromegaly.     Trachea: No tracheal deviation.  Cardiovascular:     Rate and Rhythm: Normal rate and regular rhythm.     Heart sounds: Normal heart sounds.  Pulmonary:  Effort: Pulmonary effort is normal. No respiratory distress.     Breath sounds: Normal breath sounds. No stridor. No wheezing or rales.  Chest:     Chest wall: No tenderness.  Abdominal:     General: Bowel sounds are  normal.     Palpations: Abdomen is soft.     Tenderness: There is no abdominal tenderness. There is no guarding.  Musculoskeletal:        General: No tenderness or deformity.     Comments: Chronic back and hip pain. Worse with bendig and twisting at the waist. No visible or palpable abnormalities are noted.   Lymphadenopathy:     Cervical: No cervical adenopathy.  Skin:    General: Skin is warm and dry.     Findings: No erythema or rash.  Neurological:     Mental Status: She is alert and oriented to person, place, and time. Mental status is at baseline.  Psychiatric:        Judgment: Judgment normal.     Comments: Patient feels very stressed due to the family circumstances. She reports she does not have time to take care of herself. Patient is able to fall asleep and sleep through the night. No change in the appetite.symptoms have improved some since her last visit.    Assessment/Plan: 1. Uncontrolled type 2 diabetes mellitus with hyperglycemia (HCC) - POCT HgB A1C 8.2 today, down from 12.5 at most recent check. Overall health status has improved with improved blood sugars. Will get her back on Ozempic 0.5mg  weekly. Continue other diabetic medication as prescribed .  2. Inflammatory polyarthritis ([place]) May continue tramadol 50mg  three times daily if needed for pain. Takes meloxicam one to two times daily to control pain and inflammation.  - traMADol (ULTRAM) 50 MG tablet; Take 1 tablet (50 mg total) by mouth 3 (three) times daily as needed for moderate pain.  Dispense: 90 tablet; Refill: 2  3. Irritable bowel syndrome with diarrhea Improving with improved blood sugars. May continue low dose viberzi as needed for acute symptoms.   4. Essential hypertension Generally stable. Continue bp medication as prescribed .  5. Mixed hyperlipidemia Check fasting lipid panel. Adjust statin dosing as indicated .  6. Acquired hypothyroidism Thyroid panel to be checked. Adjust levothyroxine as  indicated.   General Counseling: keshawna dix understanding of the findings of todays visit and agrees with plan of treatment. I have discussed any further diagnostic evaluation that may be needed or ordered today. We also reviewed her medications today. she has been encouraged to call the office with any questions or concerns that should arise related to todays visit.  Diabetes Counseling:  1. Addition of ACE inh/ ARB'S for nephroprotection. Microalbumin is updated  2. Diabetic foot care, prevention of complications. Podiatry consult 3. Exercise and lose weight.  4. Diabetic eye examination, Diabetic eye exam is updated  5. Monitor blood sugar closlely. nutrition counseling.  6. Sign and symptoms of hypoglycemia including shaking sweating,confusion and headaches.  This patient was seen by Leretha Pol FNP Collaboration with Dr Lavera Guise as a part of collaborative care agreement  Orders Placed This Encounter  Procedures  . POCT HgB A1C    Meds ordered this encounter  Medications  . traMADol (ULTRAM) 50 MG tablet    Sig: Take 1 tablet (50 mg total) by mouth 3 (three) times daily as needed for moderate pain.    Dispense:  90 tablet    Refill:  2    Confirmed with  patient no allergy to tramadol    Order Specific Question:   Supervising Provider    Answer:   Lavera Guise [[id number]]    Time spent: 29 Minutes      Dr Lavera Guise Internal medicine

## 2018-03-27 LAB — VITAMIN D 25 HYDROXY (VIT D DEFICIENCY, FRACTURES): Vit D, 25-Hydroxy: 20.5 ng/mL — ABNORMAL LOW (ref 30.0–100.0)

## 2018-03-27 LAB — MICROALBUMIN, URINE: Microalb, Ur: 81.9 ug/mL — ABNORMAL HIGH

## 2018-04-08 ENCOUNTER — Telehealth: Payer: Self-pay

## 2018-04-08 NOTE — Telephone Encounter (Signed)
Pt advised we had samples for ozempic pickup monday

## 2018-04-08 NOTE — Telephone Encounter (Signed)
I have sample of ozempic for her here. She can get this on Monday for sure and start taking on mondays. Shot is in the refridgerator with her name on it .

## 2018-04-28 ENCOUNTER — Other Ambulatory Visit: Payer: Self-pay | Admitting: Nurse Practitioner

## 2018-04-28 DIAGNOSIS — F321 Major depressive disorder, single episode, moderate: Secondary | ICD-10-CM

## 2018-05-24 ENCOUNTER — Other Ambulatory Visit: Payer: Self-pay

## 2018-05-24 MED ORDER — TIZANIDINE HCL 4 MG PO TABS: ORAL_TABLET | ORAL | 1 refills | Status: DC

## 2018-06-03 ENCOUNTER — Ambulatory Visit: Payer: Self-pay | Admitting: Nurse Practitioner

## 2018-06-14 ENCOUNTER — Other Ambulatory Visit: Payer: Self-pay | Admitting: Nurse Practitioner

## 2018-06-14 DIAGNOSIS — M064 Inflammatory polyarthropathy: Secondary | ICD-10-CM

## 2018-06-14 MED ORDER — MELOXICAM 15 MG PO TABS: 15.0000 mg | ORAL_TABLET | Freq: Every day | ORAL | 3 refills | Status: DC

## 2018-06-21 ENCOUNTER — Other Ambulatory Visit: Payer: Self-pay

## 2018-06-21 DIAGNOSIS — E1165 Type 2 diabetes mellitus with hyperglycemia: Secondary | ICD-10-CM

## 2018-06-21 MED ORDER — INSULIN DEGLUDEC 100 UNIT/ML ~~LOC~~ SOPN: 60.0000 [IU] | PEN_INJECTOR | Freq: Every day | SUBCUTANEOUS | 5 refills | Status: DC

## 2018-08-12 ENCOUNTER — Other Ambulatory Visit: Payer: Self-pay | Admitting: Nurse Practitioner

## 2018-08-12 ENCOUNTER — Telehealth: Payer: Self-pay

## 2018-08-12 DIAGNOSIS — M064 Inflammatory polyarthropathy: Secondary | ICD-10-CM

## 2018-08-12 MED ORDER — TRAMADOL HCL 50 MG PO TABS: 50.0000 mg | ORAL_TABLET | Freq: Three times a day (TID) | ORAL | 0 refills | Status: DC | PRN

## 2018-08-12 NOTE — Telephone Encounter (Signed)
Filled 10 day tramadol prescription for patient and sent to her pharmacy.

## 2018-08-12 NOTE — Progress Notes (Signed)
Filled 10 day tramadol prescription for patient and sent to her pharmacy.

## 2018-08-14 ENCOUNTER — Emergency Department
Admission: EM | Admit: 2018-08-14 | Discharge: 2018-08-15 | Disposition: A | Discharge status: Home / Self Care | Payer: Medicare Other | Attending: Emergency Medicine | Admitting: Emergency Medicine

## 2018-08-14 ENCOUNTER — Emergency Department: Payer: Medicare Other

## 2018-08-14 ENCOUNTER — Other Ambulatory Visit: Payer: Self-pay

## 2018-08-14 ENCOUNTER — Encounter: Payer: Self-pay | Admitting: Emergency Medicine

## 2018-08-14 DIAGNOSIS — I10 Essential (primary) hypertension: Secondary | ICD-10-CM | POA: Insufficient documentation

## 2018-08-14 DIAGNOSIS — I714 Abdominal aortic aneurysm, without rupture: Secondary | ICD-10-CM | POA: Diagnosis not present

## 2018-08-14 DIAGNOSIS — R1010 Upper abdominal pain, unspecified: Secondary | ICD-10-CM | POA: Insufficient documentation

## 2018-08-14 DIAGNOSIS — Z7982 Long term (current) use of aspirin: Secondary | ICD-10-CM | POA: Diagnosis not present

## 2018-08-14 DIAGNOSIS — E1142 Type 2 diabetes mellitus with diabetic polyneuropathy: Secondary | ICD-10-CM | POA: Diagnosis not present

## 2018-08-14 DIAGNOSIS — E039 Hypothyroidism, unspecified: Secondary | ICD-10-CM | POA: Diagnosis not present

## 2018-08-14 DIAGNOSIS — R1011 Right upper quadrant pain: Secondary | ICD-10-CM | POA: Diagnosis not present

## 2018-08-14 DIAGNOSIS — F17210 Nicotine dependence, cigarettes, uncomplicated: Secondary | ICD-10-CM | POA: Diagnosis not present

## 2018-08-14 DIAGNOSIS — Z79899 Other long term (current) drug therapy: Secondary | ICD-10-CM | POA: Insufficient documentation

## 2018-08-14 DIAGNOSIS — Z794 Long term (current) use of insulin: Secondary | ICD-10-CM | POA: Insufficient documentation

## 2018-08-14 LAB — CBC
HCT: 45.9 % (ref 36.0–46.0)
Hemoglobin: 15.6 g/dL — ABNORMAL HIGH (ref 12.0–15.0)
MCH: 29.3 pg (ref 26.0–34.0)
MCHC: 34 g/dL (ref 30.0–36.0)
MCV: 86.3 fL (ref 80.0–100.0)
Platelets: 330 10*3/uL (ref 150–400)
RBC: 5.32 MIL/uL — ABNORMAL HIGH (ref 3.87–5.11)
RDW: 12.9 % (ref 11.5–15.5)
WBC: 10.9 10*3/uL — ABNORMAL HIGH (ref 4.0–10.5)
nRBC: 0 % (ref 0.0–0.2)

## 2018-08-14 LAB — COMPREHENSIVE METABOLIC PANEL
ALT: 21 U/L (ref 0–44)
AST: 18 U/L (ref 15–41)
Albumin: 4.3 g/dL (ref 3.5–5.0)
Alkaline Phosphatase: 74 U/L (ref 38–126)
Anion gap: 10 (ref 5–15)
BUN: 16 mg/dL (ref 6–20)
CO2: 27 mmol/L (ref 22–32)
Calcium: 9.8 mg/dL (ref 8.9–10.3)
Chloride: 99 mmol/L (ref 98–111)
Creatinine, Ser: 0.56 mg/dL (ref 0.44–1.00)
GFR calc Af Amer: >60 mL/min (ref >60)
GFR calc non Af Amer: >60 mL/min (ref >60)
Glucose, Bld: 187 mg/dL — ABNORMAL HIGH (ref 70–99)
Potassium: 4.5 mmol/L (ref 3.5–5.1)
Sodium: 136 mmol/L (ref 135–145)
Total Bilirubin: 0.2 mg/dL — ABNORMAL LOW (ref 0.3–1.2)
Total Protein: 8 g/dL (ref 6.5–8.1)

## 2018-08-14 LAB — LIPASE, BLOOD: Lipase: 30 U/L (ref 11–51)

## 2018-08-14 MED ORDER — IOHEXOL 240 MG/ML SOLN
50.0000 mL | Freq: Once | INTRAMUSCULAR | Status: AC | PRN
  Administered 2018-08-14: 50 mL via ORAL

## 2018-08-14 MED ORDER — IOHEXOL 300 MG/ML  SOLN
100.0000 mL | Freq: Once | INTRAMUSCULAR | Status: AC | PRN
  Administered 2018-08-14: 100 mL via INTRAVENOUS

## 2018-08-14 MED ORDER — ONDANSETRON 8 MG PO TBDP: 8.0000 mg | ORAL_TABLET | Freq: Three times a day (TID) | ORAL | 0 refills | Status: DC | PRN

## 2018-08-14 NOTE — Discharge Instructions (Signed)
Follow-up with your doctor next week as scheduled.  You can take the prescribed Zofran (ondansetron) as needed for nausea and your regular pain medications.  Return to the ER for new, worsening, or persistent severe pain, vomiting, fever, or any other new or worsening symptoms that concern you.

## 2018-08-14 NOTE — ED Triage Notes (Signed)
Pt arrived via POV with reports of abdominal pain, pt states pain is in the epigastric region. Pt c/o diarrhea also. Pt states she has hx of IBS. Pt states the pain has been more intense today and has been having pain for several days.

## 2018-08-14 NOTE — ED Provider Notes (Signed)
[facility] Emergency Department Provider Note ____________________________________________   First MD Initiated Contact with Patient 08/14/18 1944     (approximate)  I have reviewed the triage vital signs and the nursing notes.   HISTORY  Chief Complaint Abdominal Pain    HPI Morgan Daniels is a 58 y.o. female with PMH as noted below including IBS, hiatal hernia, and diabetes who presents with abdominal pain, primarily in the right upper quadrant, gradual onset over the last week, and associated with nausea.  The patient denies any significant vomiting.  She has diarrhea but states that this is chronic and related to her IBS.  The patient states that the pain is the worst right after she eats.  She denies fever.  She states that although she has had a lot of chronic pain, she has not had abdominal pain this severe in the past.  Past Medical History:  Diagnosis Date  . Anxiety   . Arthritis   . Carotid artery occlusion   . Chronic kidney disease May 2017   UTI  . Depression   . Diabetes ([place])   . Diverticulosis   . Fatty liver   . Fibromyalgia   . GERD (gastroesophageal reflux disease)   . H/O hiatal hernia   . Hypertension   . IBS (irritable bowel syndrome)   . Peptic ulcer   . Plantar fasciitis   . Stroke [facility]) Dec. 14,2013   Right side-ministroke    Patient Active Problem List   Diagnosis Date Noted  . Irritable bowel syndrome with diarrhea 12/30/2017  . Uncontrolled type 2 diabetes mellitus with hyperglycemia ([place]) 09/29/2017  . Inflammatory polyarthritis ([place]) 09/29/2017  . Depression, major, single episode, moderate ([place]) 09/29/2017  . Encounter for general adult medical examination with abnormal findings 06/11/2017  . Type 2 diabetes mellitus with peripheral neuropathy ([place]) 06/11/2017  . Vitamin D deficiency 06/11/2017  . Mixed hyperlipidemia 06/11/2017  . Acquired hypothyroidism 06/11/2017  . Dysuria 06/11/2017  . Essential  hypertension 03/02/2017  . Impingement syndrome of shoulder region 01/24/2016  . Carotid stenosis 08/09/2013  . Carotid artery stenosis with cerebral infarction ([place]) 08/03/2013  . Aftercare following surgery of the circulatory system, [place] 12/08/2012  . Tenderness-Left neck 12/08/2012  . Pain in neck 05/12/2012  . Scalp pain 05/12/2012  . Occlusion and stenosis of carotid artery without mention of cerebral infarction 04/28/2012  . HYPERCHOLESTEROLEMIA 06/01/2006  . PANIC ATTACK 06/01/2006  . TOBACCO ABUSE 06/01/2006  . DEPRESSION 06/01/2006  . ALLERGIC RHINITIS 06/01/2006  . GERD 06/01/2006  . DIVERTICULAR DISEASE 06/01/2006  . MIGRAINES, HX OF 06/01/2006    Past Surgical History:  Procedure Laterality Date  . ABDOMINAL HYSTERECTOMY  1990  . BACK SURGERY  2000, 2004  . CAROTID ENDARTERECTOMY Left 05/06/12  . CARPAL TUNNEL RELEASE Left 07/18/2015   Procedure: CARPAL TUNNEL RELEASE;  Surgeon: Earnestine Leys, MD;  Location: [facility] ORS;  Service: Orthopedics;  Laterality: Left;  . CEREBRAL ANGIOGRAM Bilateral 05/03/2012   Procedure: CEREBRAL ANGIOGRAM;  Surgeon: Angelia Mould, MD;  Location[facility] CATH LAB;  Service: Cardiovascular;  Laterality: Bilateral;  . CHOLECYSTECTOMY  2001  . ENDARTERECTOMY Left 05/06/2012   Procedure: ENDARTERECTOMY CAROTID;  Surgeon: Angelia Mould, MD;  Location: [place];  Service: Vascular;  Laterality: Left;  . ENDARTERECTOMY Right 08/09/2013   Procedure: ENDARTERECTOMY CAROTID-RIGHT;  Surgeon: Angelia Mould, MD;  Location: [place];  Service: Vascular;  Laterality: Right;  . HERNIA REPAIR    . PATCH ANGIOPLASTY Left  05/06/2012   Procedure: WITH DACRON PATCH ANGIOPLASTY ;  Surgeon: Angelia Mould, MD;  Location: [place];  Service: Vascular;  Laterality: Left;  . [place]  2004  . TONSILLECTOMY    . TUBAL LIGATION      Prior to Admission medications   Medication Sig Start Date End Date Taking? Authorizing Provider  aspirin EC 81 MG  tablet Take 81 mg by mouth daily.    [provider]  atorvastatin (LIPITOR) 10 MG tablet Take 10 mg by mouth daily at 6 PM.     [provider]  cetirizine (ZYRTEC) 10 MG tablet Take 10 mg by mouth daily.    [provider]  Cholecalciferol (VITAMIN D-3) 5000 UNITS TABS Take 5,000 Units by mouth daily.     [provider]  Continuous Blood Gluc Sensor (FREESTYLE LIBRE 14 DAY SENSOR) MISC USE AS DIRECTED 01/01/18   Ronnell Freshwater, NP  fluconazole (DIFLUCAN) 150 MG tablet TAKE 1 TABLET BY MOUTH ONCE FOR YEAST INFECTION. MAY REPEAT IN 3 DAYS FOR PERSISTENT SYMPTOMS 02/05/18   Ronnell Freshwater, NP  FLUoxetine (PROZAC) 40 MG capsule TAKE 2 CAPSULES BY MOUTH DAILY 04/28/18   Ronnell Freshwater, NP  gabapentin (NEURONTIN) 600 MG tablet TAKE 1 TABLET BY MOUTH THREE TIMES A DAY. *DOSE INCREASE* 03/22/18   Ronnell Freshwater, NP  hydrochlorothiazide (HYDRODIURIL) 12.5 MG tablet TAKE 1 TABLET BY MOUTH DAILY FOR BLOOD PRESSURE/EDEMA 10/20/17   Ronnell Freshwater, NP  insulin aspart (NOVOLOG) 100 UNIT/ML injection Sliding scale insulin BID prior to meals. Instructions provided. 09/21/17   Ronnell Freshwater, NP  insulin degludec (TRESIBA FLEXTOUCH) 100 UNIT/ML SOPN FlexTouch Pen Inject 0.6 mLs (60 Units total) into the skin daily. 06/21/18   Ronnell Freshwater, NP  Insulin Pen Needle (NOVOFINE) 32G X 6 MM MISC Use as directed with Victoza  Dx code E11.65 10/27/17   Ronnell Freshwater, NP  meloxicam (MOBIC) 15 MG tablet Take 1 tablet (15 mg total) by mouth daily. 06/14/18   Ronnell Freshwater, NP  metoprolol succinate (TOPROL-XL) 50 MG 24 hr tablet Take 1 tablet (50 mg total) by mouth daily. Take with or immediately following a meal. 03/24/18   Boscia, Greer Ee, NP  omeprazole (PRILOSEC OTC) 20 MG tablet Take 40 mg by mouth daily.    [provider]  ondansetron (ZOFRAN ODT) 8 MG disintegrating tablet Take 1 tablet (8 mg total) by mouth every 8 (eight) hours as needed for nausea.  08/14/18   Arta Silence, MD  ONETOUCH DELICA LANCETS FINE MISC CHECK BLOOD SUGAR THREE TIMES A DAY 10/09/14   [provider]  [facility] VERIO test strip CHECK BLOOD SUGAR THREE TIMES A DAY 10/09/14   [provider]  Semaglutide,0.25 or 0.5MG /DOS, (OZEMPIC, 0.25 OR 0.5 MG/DOSE,) 2 MG/1.5ML SOPN Inject 0.5 mg into the skin once a week. 12/29/17   Boscia, Greer Ee, NP  tiZANidine (ZANAFLEX) 4 MG tablet TAKE 1 TABLET(S) BY MOUTH TWICE A DAY AS NEEDED FOR MUSCLE PAIN AND SPASMS 05/24/18   Ronnell Freshwater, NP  traMADol (ULTRAM) 50 MG tablet Take 1 tablet (50 mg total) by mouth 3 (three) times daily as needed for moderate pain. 08/12/18   Boscia, Greer Ee, NP  VIBERZI 75 MG TABS Take 1 tablet by mouth 2 (two) times daily. 06/20/15   [provider]    Allergies Simvastatin, Morphine and related, and Latex  Family History  Problem Relation Age of Onset  . Hypertension  Mother   . Hyperlipidemia Mother   . Deep vein thrombosis Mother   . Cancer Father   . Alcohol abuse Father   . Hypertension Maternal Grandmother   . Deep vein thrombosis Sister   . Alcohol abuse Sister   . Alcohol abuse Sister     Social History Social History   Tobacco Use  . Smoking status: Current Every Day Smoker    Packs/day: 1.00    Years: 40.00    Pack years: 40.00    Types: Cigarettes, E-cigarettes    Start date: 11/09/1970  . Smokeless tobacco: Never Used  . Tobacco comment: using vapor   Substance Use Topics  . Alcohol use: No    Alcohol/week: 0.0 standard drinks    Comment:   quite 2 years   . Drug use: No    Review of Systems  Constitutional: No fever/chills. Eyes: No redness. ENT: No sore throat. Cardiovascular: Denies chest pain. Respiratory: Denies shortness of breath. Gastrointestinal: No vomiting. Genitourinary: Negative for dysuria.  Musculoskeletal: Negative for back pain. Skin: Negative for rash. Neurological: Negative for headache.    ____________________________________________   PHYSICAL EXAM:  VITAL SIGNS: ED Triage Vitals  Enc Vitals Group     BP 08/14/18 1541 (!) 143/75     Pulse Rate 08/14/18 1541 80     Resp 08/14/18 1541 16     Temp 08/14/18 1541 98.6 F (37 C)     Temp Source 08/14/18 1541 Oral     SpO2 08/14/18 1541 94 %     Weight 08/14/18 1540 200 lb (90.7 kg)     Height 08/14/18 1540 5\' 7"  (1.702 m)     Head Circumference --      Peak Flow --      Pain Score 08/14/18 1549 3     Pain Loc --      Pain Edu? --      Excl. in [place]? --     Constitutional: Alert and oriented. Well appearing and in no acute distress. Eyes: Conjunctivae are normal.  No scleral icterus. Head: Atraumatic. Nose: No congestion/rhinnorhea. Mouth/Throat: Mucous membranes are moist.   Neck: Normal range of motion.  Cardiovascular: Normal rate, regular rhythm. Good peripheral circulation. Respiratory: Normal respiratory effort.  No retractions.  Gastrointestinal: Mild right upper and mid abdominal tenderness.  No distention.  Genitourinary: No flank tenderness. Musculoskeletal: No lower extremity edema.  Extremities warm and well perfused.  Neurologic:  Normal speech and language. No gross focal neurologic deficits are appreciated.  Skin:  Skin is warm and dry. No rash noted. Psychiatric: Mood and affect are normal. Speech and behavior are normal.  ____________________________________________   LABS (all labs ordered are listed, but only abnormal results are displayed)  Labs Reviewed  COMPREHENSIVE METABOLIC PANEL - Abnormal; Notable for the following components:      Result Value   Glucose, Bld 187 (*)    Total Bilirubin 0.2 (*)    All other components within normal limits  CBC - Abnormal; Notable for the following components:   WBC 10.9 (*)    RBC 5.32 (*)    Hemoglobin 15.6 (*)    All other components within normal limits  LIPASE, BLOOD  URINALYSIS, COMPLETE (UACMP) WITH MICROSCOPIC    ____________________________________________  EKG  ED ECG REPORT I, Arta Silence, the attending physician, personally viewed and interpreted this ECG.  Date: 08/15/2018 EKG Time: 1551 Rate: 78 Rhythm: normal sinus rhythm QRS Axis: normal Intervals: normal ST/T Wave abnormalities: normal Narrative Interpretation:  no evidence of acute ischemia  ____________________________________________  RADIOLOGY  CT abdomen: No acute abnormality  ____________________________________________   PROCEDURES  Procedure(s) performed: No  Procedures  Critical Care performed: No ____________________________________________   INITIAL IMPRESSION / ASSESSMENT AND PLAN / ED COURSE  Pertinent labs & imaging results that were available during my care of the patient were reviewed by me and considered in my medical decision making (see chart for details).  58 year old female with PMH as noted above presents with gradual onset of primarily right upper abdominal pain over the last week associated with nausea but no vomiting.  The patient also reports chronic diarrhea.  She states that she is status post cholecystectomy but is not sure if she has her appendix.  On exam the patient is overall well-appearing and her vital signs are normal except for hypertension.  The abdomen is soft except for mild right upper and mid abdominal tenderness.  Overall I suspect most likely symptoms related to IBS especially given the chronic diarrhea and the pain immediately after eating.  Differential includes gastritis, PUD, gastroparesis, pancreatitis, or other hepatobiliary cause although the patient is status post cholecystectomy.  I have a lower suspicion for diverticulitis or colitis.  We will obtain lab work-up, CT abdomen, and reassess.  ----------------------------------------- 12:17 AM on 08/15/2018 -----------------------------------------  Lab work-up is unremarkable except for minimally elevated  WBC count.  CT abdomen shows no acute findings to explain the patient's pain.  She continues to appear comfortable and has not required any pain medication in the ED.  She has been tolerating p.o.  Although she has not provided a urine sample, she has no urinary symptoms and I do not suspect a urinary tract etiology of her presentation.  I counseled her on the results of the work-up.  She feels comfortable going home.  She has a follow-up appointment with her doctor arranged in several days.  She requested a prescription for Zofran which she recently ran out of, so I provided this.  I gave her thorough return precautions, and she expressed understanding.    ____________________________________________   FINAL CLINICAL IMPRESSION(S) / ED DIAGNOSES  Final diagnoses:  Pain of upper abdomen      NEW MEDICATIONS STARTED DURING THIS VISIT:  New Prescriptions   ONDANSETRON (ZOFRAN ODT) 8 MG DISINTEGRATING TABLET    Take 1 tablet (8 mg total) by mouth every 8 (eight) hours as needed for nausea.     Note:  This document was prepared using Dragon voice recognition software and may include unintentional dictation errors.   Arta Silence, MD 08/15/18 [phone]

## 2018-08-14 NOTE — ED Notes (Signed)
Patient transported to CT 

## 2018-08-14 NOTE — ED Notes (Signed)
Patient c/o severe epigastric pain post eating/drinking. Patient reports hx of IBS. Patient denies current pain. Patient is tender to palpation of epigastric region.

## 2018-08-15 MED ORDER — ONDANSETRON 4 MG PO TBDP
ORAL_TABLET | ORAL | Status: AC
  Administered 2018-08-15: 8 mg via ORAL
  Filled 2018-08-15: qty 2

## 2018-08-15 MED ORDER — ONDANSETRON 4 MG PO TBDP
8.0000 mg | ORAL_TABLET | Freq: Once | ORAL | Status: AC
  Administered 2018-08-15: 8 mg via ORAL

## 2018-08-15 NOTE — ED Notes (Signed)
Reviewed discharge instructions, follow-up care, and prescriptions with patient. Patient verbalized understanding of all information reviewed. Patient stable, with no distress noted at this time.    

## 2018-08-19 ENCOUNTER — Ambulatory Visit: Payer: Medicare Other | Admitting: Nurse Practitioner

## 2018-08-19 ENCOUNTER — Other Ambulatory Visit: Payer: Self-pay

## 2018-08-19 ENCOUNTER — Encounter: Payer: Self-pay | Admitting: Nurse Practitioner

## 2018-08-19 VITALS — Ht 67.0 in | Wt 200.0 lb

## 2018-08-19 DIAGNOSIS — M7540 Impingement syndrome of unspecified shoulder: Secondary | ICD-10-CM

## 2018-08-19 DIAGNOSIS — I10 Essential (primary) hypertension: Secondary | ICD-10-CM | POA: Diagnosis not present

## 2018-08-19 DIAGNOSIS — E1142 Type 2 diabetes mellitus with diabetic polyneuropathy: Secondary | ICD-10-CM | POA: Diagnosis not present

## 2018-08-19 DIAGNOSIS — R1084 Generalized abdominal pain: Secondary | ICD-10-CM | POA: Diagnosis not present

## 2018-08-19 DIAGNOSIS — M064 Inflammatory polyarthropathy: Secondary | ICD-10-CM

## 2018-08-19 DIAGNOSIS — E1165 Type 2 diabetes mellitus with hyperglycemia: Secondary | ICD-10-CM | POA: Diagnosis not present

## 2018-08-19 DIAGNOSIS — K580 Irritable bowel syndrome with diarrhea: Secondary | ICD-10-CM | POA: Diagnosis not present

## 2018-08-19 MED ORDER — DICYCLOMINE HCL 20 MG PO TABS: 20.0000 mg | ORAL_TABLET | Freq: Three times a day (TID) | ORAL | 2 refills | Status: DC

## 2018-08-19 MED ORDER — HYDROCHLOROTHIAZIDE 12.5 MG PO TABS: ORAL_TABLET | ORAL | 5 refills | Status: DC

## 2018-08-19 MED ORDER — INSULIN ASPART 100 UNIT/ML ~~LOC~~ SOLN: SUBCUTANEOUS | 5 refills | Status: DC

## 2018-08-19 MED ORDER — TRAMADOL HCL 50 MG PO TABS: 50.0000 mg | ORAL_TABLET | Freq: Three times a day (TID) | ORAL | 3 refills | Status: DC | PRN

## 2018-08-19 MED ORDER — GABAPENTIN 600 MG PO TABS: ORAL_TABLET | ORAL | 2 refills | Status: DC

## 2018-08-19 MED ORDER — TRESIBA FLEXTOUCH 100 UNIT/ML ~~LOC~~ SOPN: 60.0000 [IU] | PEN_INJECTOR | Freq: Every day | SUBCUTANEOUS | 5 refills | Status: DC

## 2018-08-19 MED ORDER — METOPROLOL SUCCINATE ER 50 MG PO TB24: 50.0000 mg | ORAL_TABLET | Freq: Every day | ORAL | 5 refills | Status: DC

## 2018-08-19 MED ORDER — OZEMPIC (0.25 OR 0.5 MG/DOSE) 2 MG/1.5ML ~~LOC~~ SOPN: 0.5000 mg | PEN_INJECTOR | SUBCUTANEOUS | 5 refills | Status: DC

## 2018-08-19 MED ORDER — MELOXICAM 15 MG PO TABS: 15.0000 mg | ORAL_TABLET | Freq: Every day | ORAL | 3 refills | Status: DC

## 2018-08-19 MED ORDER — TIZANIDINE HCL 4 MG PO TABS: ORAL_TABLET | ORAL | 3 refills | Status: DC

## 2018-08-19 NOTE — Progress Notes (Signed)
[facility] [place], [place] [address]  Internal MEDICINE  Telephone Visit  Patient Name: Morgan Daniels  [id number]  [id number]  Date of Service: 08/22/2018  I connected with the patient at 2:50pm by webcam and verified the patients identity using two identifiers.   I discussed the limitations, risks, security and privacy concerns of performing an evaluation and management service by webcam and the availability of in person appointments. I also discussed with the patient that there may be a patient responsible charge related to the service.  The patient expressed understanding and agrees to proceed.    Chief Complaint  Patient presents with  . Telephone Screen    VIDEO VISIT [phone]  . Telephone Assessment  . Medical Management of Chronic Issues    medication refills  . Hyperlipidemia  . Hypertension  . Diabetes  . Labs Only    pt went to hospital on saturday, still having stomach issues, lab results    The patient has been contacted via telephone for follow up visit due to concerns for spread of novel coronavirus. The patient made a trip to the ER this past Saturday due to severe abdominal pain and diarrhea. This has been ongoing issue for some time. She has had gallbladder removed. She continues to have frequent episodes of diarrhea with cramping. This can be especially bad after eating. She has been on low dose dicyclomine and hyoscyamine to help. She has also taken rounds of Xifaxin in the past. She has had some relief with Viberzi, however, this medication is very expensive and is no longer recommended for patients who have had gallbladder removed. She had CT scan in the ER. No acute abnormalities were noted of the abdominal organs. 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 Since her visit to the ER, she states  that her symptoms have improved. Has been resting and has consumed a bland diet. She does feel fatigued. Blood sugars have been stable since her last visit. She states that she needs to have refills of her routine medications .      Current Medication: Outpatient Encounter Medications as of 08/19/2018  Medication Sig Note  . aspirin EC 81 MG tablet Take 81 mg by mouth daily.   [name] atorvastatin (LIPITOR) 10 MG tablet Take 10 mg by mouth daily at 6 PM.    . cetirizine (ZYRTEC) 10 MG tablet Take 10 mg by mouth daily.   . Cholecalciferol (VITAMIN D-3) 5000 UNITS TABS Take 5,000 Units by mouth daily.    . Continuous Blood Gluc Sensor (FREESTYLE LIBRE 14 DAY SENSOR) MISC USE AS DIRECTED   . dicyclomine (BENTYL) 20 MG tablet Take 1 tablet (20 mg total) by mouth 4 (four) times daily -  before meals and at bedtime.   . fluconazole (DIFLUCAN) 150 MG tablet TAKE 1 TABLET BY MOUTH ONCE FOR YEAST INFECTION. MAY REPEAT IN 3 DAYS FOR PERSISTENT SYMPTOMS   . FLUoxetine (PROZAC) 40 MG capsule TAKE 2 CAPSULES BY MOUTH DAILY   . gabapentin (NEURONTIN) 600 MG tablet TAKE 1 TABLET BY MOUTH THREE TIMES A DAY.   . hydrochlorothiazide (HYDRODIURIL) 12.5 MG tablet TAKE 1 TABLET BY MOUTH DAILY FOR BLOOD PRESSURE/EDEMA   . insulin aspart (NOVOLOG) 100 UNIT/ML injection Sliding scale insulin BID prior to meals. Instructions provided.not to exceed 45 units daily. E11.65   .  insulin degludec (TRESIBA FLEXTOUCH) 100 UNIT/ML SOPN FlexTouch Pen Inject 0.6 mLs (60 Units total) into the skin daily.   . Insulin Pen Needle (NOVOFINE) 32G X 6 MM MISC Use as directed with Victoza  Dx code E11.65   . meloxicam (MOBIC) 15 MG tablet Take 1 tablet (15 mg total) by mouth daily.   . metoprolol succinate (TOPROL-XL) 50 MG 24 hr tablet Take 1 tablet (50 mg total) by mouth daily. Take with or immediately following a meal.   . ondansetron (ZOFRAN ODT) 8 MG disintegrating tablet Take 1 tablet (8 mg total) by mouth every 8 (eight) hours as needed  for nausea.   Glory Rosebush DELICA LANCETS FINE MISC CHECK BLOOD SUGAR THREE TIMES A DAY 11/09/2014: Received from: External Pharmacy  . ONETOUCH VERIO test strip CHECK BLOOD SUGAR THREE TIMES A DAY 11/09/2014: Received from: External Pharmacy  . Semaglutide,0.25 or 0.5MG /DOS, (OZEMPIC, 0.25 OR 0.5 MG/DOSE,) 2 MG/1.5ML SOPN Inject 0.5 mg into the skin once a week.   [name] tiZANidine (ZANAFLEX) 4 MG tablet TAKE 1 TABLET(S) BY MOUTH TWICE A DAY AS NEEDED FOR MUSCLE PAIN AND SPASMS   . traMADol (ULTRAM) 50 MG tablet Take 1 tablet (50 mg total) by mouth 3 (three) times daily as needed for moderate pain.   [name] VIBERZI 75 MG TABS Take 1 tablet by mouth 2 (two) times daily. 06/27/2015: Received from: External Pharmacy  . [DISCONTINUED] gabapentin (NEURONTIN) 600 MG tablet TAKE 1 TABLET BY MOUTH THREE TIMES A DAY. *DOSE INCREASE*   . [DISCONTINUED] hydrochlorothiazide (HYDRODIURIL) 12.5 MG tablet TAKE 1 TABLET BY MOUTH DAILY FOR BLOOD PRESSURE/EDEMA   . [DISCONTINUED] insulin aspart (NOVOLOG) 100 UNIT/ML injection Sliding scale insulin BID prior to meals. Instructions provided.   . [DISCONTINUED] insulin degludec (TRESIBA FLEXTOUCH) 100 UNIT/ML SOPN FlexTouch Pen Inject 0.6 mLs (60 Units total) into the skin daily.   . [DISCONTINUED] meloxicam (MOBIC) 15 MG tablet Take 1 tablet (15 mg total) by mouth daily.   . [DISCONTINUED] metoprolol succinate (TOPROL-XL) 50 MG 24 hr tablet Take 1 tablet (50 mg total) by mouth daily. Take with or immediately following a meal.   . [DISCONTINUED] omeprazole (PRILOSEC OTC) 20 MG tablet Take 40 mg by mouth daily.   . [DISCONTINUED] Semaglutide,0.25 or 0.5MG /DOS, (OZEMPIC, 0.25 OR 0.5 MG/DOSE,) 2 MG/1.5ML SOPN Inject 0.5 mg into the skin once a week.   . [DISCONTINUED] tiZANidine (ZANAFLEX) 4 MG tablet TAKE 1 TABLET(S) BY MOUTH TWICE A DAY AS NEEDED FOR MUSCLE PAIN AND SPASMS   . [DISCONTINUED] traMADol (ULTRAM) 50 MG tablet Take 1 tablet (50 mg total) by mouth 3 (three) times daily as  needed for moderate pain.    No facility-administered encounter medications on file as of 08/19/2018.     Surgical History: Past Surgical History:  Procedure Laterality Date  . ABDOMINAL HYSTERECTOMY  1990  . BACK SURGERY  2000, 2004  . CAROTID ENDARTERECTOMY Left 05/06/12  . CARPAL TUNNEL RELEASE Left 07/18/2015   Procedure: CARPAL TUNNEL RELEASE;  Surgeon: Earnestine Leys, MD;  Location: [facility] ORS;  Service: Orthopedics;  Laterality: Left;  . CEREBRAL ANGIOGRAM Bilateral 05/03/2012   Procedure: CEREBRAL ANGIOGRAM;  Surgeon: Angelia Mould, MD;  Location: [facility] CATH LAB;  Service: Cardiovascular;  Laterality: Bilateral;  . CHOLECYSTECTOMY  2001  . ENDARTERECTOMY Left 05/06/2012   Procedure: ENDARTERECTOMY CAROTID;  Surgeon: Angelia Mould, MD;  Location: [place];  Service: Vascular;  Laterality: Left;  . ENDARTERECTOMY Right 08/09/2013   Procedure: ENDARTERECTOMY CAROTID-RIGHT;  Surgeon: Harrell Gave  Nicole Cella, MD;  Location: [place];  Service: Vascular;  Laterality: Right;  . HERNIA REPAIR    . PATCH ANGIOPLASTY Left 05/06/2012   Procedure: WITH DACRON PATCH ANGIOPLASTY ;  Surgeon: Angelia Mould, MD;  Location: [place];  Service: Vascular;  Laterality: Left;  . [place]  2004  . TONSILLECTOMY    . TUBAL LIGATION      Medical History: Past Medical History:  Diagnosis Date  . Anxiety   . Arthritis   . Carotid artery occlusion   . Chronic kidney disease May 2017   UTI  . Depression   . Diabetes ([place])   . Diverticulosis   . Fatty liver   . Fibromyalgia   . GERD (gastroesophageal reflux disease)   . H/O hiatal hernia   . Hypertension   . IBS (irritable bowel syndrome)   . Peptic ulcer   . Plantar fasciitis   . Stroke [facility]) Dec. 14,2013   Right side-ministroke    Family History: Family History  Problem Relation Age of Onset  . Hypertension Mother   . Hyperlipidemia Mother   . Deep vein thrombosis Mother   . Cancer Father   . Alcohol abuse Father   .  Hypertension Maternal Grandmother   . Deep vein thrombosis Sister   . Alcohol abuse Sister   . Alcohol abuse Sister     Social History   Socioeconomic History  . Marital status: Married    Spouse name: Not on file  . Number of children: Not on file  . Years of education: Not on file  . Highest education level: Not on file  Occupational History  . Not on file  Social Needs  . Financial resource strain: Not on file  . Food insecurity    Worry: Not on file    Inability: Not on file  . Transportation needs    Medical: Not on file    Non-medical: Not on file  Tobacco Use  . Smoking status: Current Every Day Smoker    Packs/day: 1.00    Years: 40.00    Pack years: 40.00    Types: Cigarettes, E-cigarettes    Start date: 11/09/1970  . Smokeless tobacco: Never Used  . Tobacco comment: using vapor   Substance and Sexual Activity  . Alcohol use: No    Alcohol/week: 0.0 standard drinks    Comment:   quite 2 years   . Drug use: No  . Sexual activity: Not Currently    Partners: Male  Lifestyle  . Physical activity    Days per week: Not on file    Minutes per session: Not on file  . Stress: Not on file  Relationships  . Social Herbalist on phone: Not on file    Gets together: Not on file    Attends religious service: Not on file    Active member of club or organization: Not on file    Attends meetings of clubs or organizations: Not on file    Relationship status: Not on file  . Intimate partner violence    Fear of current or ex partner: Not on file    Emotionally abused: Not on file    Physically abused: Not on file    Forced sexual activity: Not on file  Other Topics Concern  . Not on file  Social History Narrative  . Not on file      Review of Systems  Constitutional: Positive for activity change and fatigue.  Negative for appetite change, chills, diaphoresis, fever and unexpected weight change.  HENT: Negative for congestion, ear discharge, ear pain,  facial swelling, postnasal drip, rhinorrhea, sinus pressure, sneezing, sore throat, trouble swallowing and voice change.   Respiratory: Negative for apnea, chest tightness, shortness of breath and wheezing.   Cardiovascular: Negative for chest pain and palpitations.       Elevated blood pressure today.  Gastrointestinal: Positive for abdominal pain and diarrhea. Negative for blood in stool, nausea and vomiting.  Endocrine: Negative for cold intolerance, heat intolerance, polyphagia and polyuria.       Improved blood sugars.   Musculoskeletal: Positive for arthralgias, back pain and myalgias.  Allergic/Immunologic: Negative for environmental allergies.  Neurological: Negative for dizziness, tremors, facial asymmetry, weakness, light-headedness and headaches.  Hematological: Negative for adenopathy.  Psychiatric/Behavioral: Positive for dysphoric mood. The patient is nervous/anxious.        The patient states that she persistently feels overwhelmed and has an "I don't care" attitude.    Today's Vitals   08/19/18 1437  Weight: 200 lb (90.7 kg)  Height: 5\' 7"  (1.702 m)   Body mass index is 31.32 kg/m.   Observation/Objective:   The patient is alert and oriented. She is pleasant and answers all questions appropriately. Breathing is non-labored. She is in no acute distress at this time. She looks as though she does not feel well.    Assessment/Plan: 1. Irritable bowel syndrome with diarrhea Increased dose of bentyl to 20mg  up to four times daily as needed for abdominal cramping and diarrhea. wil lrefer to GI for further evaluation and treatment.  - Ambulatory referral to Gastroenterology - dicyclomine (BENTYL) 20 MG tablet; Take 1 tablet (20 mg total) by mouth 4 (four) times daily -  before meals and at bedtime.  Dispense: 120 tablet; Refill: 2  2. Generalized abdominal pain Increased dose of bentyl to 20mg  up to four times daily as needed for abdominal cramping and diarrhea. wil lrefer  to GI for further evaluation and treatment.  - Ambulatory referral to Gastroenterology  3. Type 2 diabetes mellitus with peripheral neuropathy (HCC) Continue dosing of gabapentin up to three times daily - gabapentin (NEURONTIN) 600 MG tablet; TAKE 1 TABLET BY MOUTH THREE TIMES A DAY.  Dispense: 270 tablet; Refill: 2  4. Uncontrolled type 2 diabetes mellitus with hyperglycemia ([place]) Continue all diabetic medications as prescribed. Refills provided today.  - insulin aspart (NOVOLOG) 100 UNIT/ML injection; Sliding scale insulin BID prior to meals. Instructions provided.not to exceed 45 units daily. E11.65  Dispense: 10 mL; Refill: 5 - insulin degludec (TRESIBA FLEXTOUCH) 100 UNIT/ML SOPN FlexTouch Pen; Inject 0.6 mLs (60 Units total) into the skin daily.  Dispense: 5 pen; Refill: 5 - Semaglutide,0.25 or 0.5MG /DOS, (OZEMPIC, 0.25 OR 0.5 MG/DOSE,) 2 MG/1.5ML SOPN; Inject 0.5 mg into the skin once a week.  Dispense: 1 pen; Refill: 5  5. Inflammatory polyarthritis (HCC) Meloxicam 15mg  daily to reduce pain and inflammation. May continue tramadol 50mg  up to three times daily as needed for pain. Refills were provided today.  - meloxicam (MOBIC) 15 MG tablet; Take 1 tablet (15 mg total) by mouth daily.  Dispense: 30 tablet; Refill: 3 - traMADol (ULTRAM) 50 MG tablet; Take 1 tablet (50 mg total) by mouth 3 (three) times daily as needed for moderate pain.  Dispense: 90 tablet; Refill: 3  6. Impingement syndrome of shoulder region, unspecified laterality Tizanidine 4mg  as needed for muscle pain/spasms.  - tiZANidine (ZANAFLEX) 4 MG tablet; TAKE 1 TABLET(S) BY  MOUTH TWICE A DAY AS NEEDED FOR MUSCLE PAIN AND SPASMS  Dispense: 60 tablet; Refill: 3  7. Essential hypertension Continue bp medications as prescribed.  - hydrochlorothiazide (HYDRODIURIL) 12.5 MG tablet; TAKE 1 TABLET BY MOUTH DAILY FOR BLOOD PRESSURE/EDEMA  Dispense: 30 tablet; Refill: 5 - metoprolol succinate (TOPROL-XL) 50 MG 24 hr tablet; Take  1 tablet (50 mg total) by mouth daily. Take with or immediately following a meal.  Dispense: 30 tablet; Refill: 5  General Counseling: kalen neidert understanding of the findings of today's phone visit and agrees with plan of treatment. I have discussed any further diagnostic evaluation that may be needed or ordered today. We also reviewed her medications today. she has been encouraged to call the office with any questions or concerns that should arise related to todays visit.  Diabetes Counseling:  1. Addition of ACE inh/ ARB'S for nephroprotection. Microalbumin is updated  2. Diabetic foot care, prevention of complications. Podiatry consult 3. Exercise and lose weight.  4. Diabetic eye examination, Diabetic eye exam is updated  5. Monitor blood sugar closlely. nutrition counseling.  6. Sign and symptoms of hypoglycemia including shaking sweating,confusion and headaches.  This patient was seen by Leretha Pol FNP Collaboration with Dr Lavera Guise as a part of collaborative care agreement  Orders Placed This Encounter  Procedures  . Ambulatory referral to Gastroenterology    Meds ordered this encounter  Medications  . dicyclomine (BENTYL) 20 MG tablet    Sig: Take 1 tablet (20 mg total) by mouth 4 (four) times daily -  before meals and at bedtime.    Dispense:  120 tablet    Refill:  2    Order Specific Question:   Supervising Provider    Answer:   Lavera Guise [[id number]]  . gabapentin (NEURONTIN) 600 MG tablet    Sig: TAKE 1 TABLET BY MOUTH THREE TIMES A DAY.    Dispense:  270 tablet    Refill:  2    Dx neuropathy    Order Specific Question:   Supervising Provider    Answer:   Lavera Guise [[id number]]  . hydrochlorothiazide (HYDRODIURIL) 12.5 MG tablet    Sig: TAKE 1 TABLET BY MOUTH DAILY FOR BLOOD PRESSURE/EDEMA    Dispense:  30 tablet    Refill:  5    Order Specific Question:   Supervising Provider    Answer:   Lavera Guise [[id number]]  . insulin aspart (NOVOLOG) 100 UNIT/ML  injection    Sig: Sliding scale insulin BID prior to meals. Instructions provided.not to exceed 45 units daily. E11.65    Dispense:  10 mL    Refill:  5    Order Specific Question:   Supervising Provider    Answer:   Lavera Guise [[id number]]  . insulin degludec (TRESIBA FLEXTOUCH) 100 UNIT/ML SOPN FlexTouch Pen    Sig: Inject 0.6 mLs (60 Units total) into the skin daily.    Dispense:  5 pen    Refill:  5    Please provide preferred alternative if needed. Thanks.    Order Specific Question:   Supervising Provider    Answer:   Lavera Guise [[id number]]  . meloxicam (MOBIC) 15 MG tablet    Sig: Take 1 tablet (15 mg total) by mouth daily.    Dispense:  30 tablet    Refill:  3    Order Specific Question:   Supervising Provider    Answer:   Lavera Guise [[id number]]  .  metoprolol succinate (TOPROL-XL) 50 MG 24 hr tablet    Sig: Take 1 tablet (50 mg total) by mouth daily. Take with or immediately following a meal.    Dispense:  30 tablet    Refill:  5    D/c bisoprolol due to back order    Order Specific Question:   Supervising Provider    Answer:   Lavera Guise [place]  . Semaglutide,0.25 or 0.5MG /DOS, (OZEMPIC, 0.25 OR 0.5 MG/DOSE,) 2 MG/1.5ML SOPN    Sig: Inject 0.5 mg into the skin once a week.    Dispense:  1 pen    Refill:  5    Order Specific Question:   Supervising Provider    Answer:   Lavera Guise [[id number]]  . tiZANidine (ZANAFLEX) 4 MG tablet    Sig: TAKE 1 TABLET(S) BY MOUTH TWICE A DAY AS NEEDED FOR MUSCLE PAIN AND SPASMS    Dispense:  60 tablet    Refill:  3    Order Specific Question:   Supervising Provider    Answer:   Lavera Guise [[id number]]  . traMADol (ULTRAM) 50 MG tablet    Sig: Take 1 tablet (50 mg total) by mouth 3 (three) times daily as needed for moderate pain.    Dispense:  90 tablet    Refill:  3    Confirmed with patient no allergy to tramadol    Order Specific Question:   Supervising Provider    Answer:   Lavera Guise [[id number]]    Time spent: 61 Minutes    Dr Lavera Guise Internal medicine

## 2018-08-22 DIAGNOSIS — R1084 Generalized abdominal pain: Secondary | ICD-10-CM | POA: Insufficient documentation

## 2018-10-04 ENCOUNTER — Ambulatory Visit: Payer: Medicare Other | Admitting: Gastroenterology

## 2018-10-04 NOTE — Progress Notes (Deleted)
Gastroenterology Consultation  Referring Provider:     Ronnell Freshwater, NP Primary Care Physician:  Lavera Guise, MD Primary Gastroenterologist:  Dr. Allen Norris     Reason for Consultation:     Diarrhea and abdominal pain        HPI:   Morgan Daniels is a 58 y.o. y/o female referred for consultation & management of diarrhea and abdominal pain by Dr. Humphrey Rolls, Timoteo Gaul, MD.  This patient comes in with a history of irritable bowel syndrome and had previously seen Dr. Sonny Masters for colonoscopy back in 2008.  The patient was in the emergency room back at the end of June for abdominal pain with CT scan not showing any cause for her symptoms.  The patient has had diarrhea for some time.  The patient also had a cholecystectomy in the past.  She was treated in the past with Viberzi and had to have it stopped because of the change in recommendations since she had her gallbladder out.  The CT scan showed:  IMPRESSION: No acute intra-abdominal or intrapelvic abnormalities. Small infrarenal abdominal aortic aneurysm 3.1 x 2.7 cm, recommendation below.  The patient recently had her medication adjusted with her dicyclomine being increased.  Past Medical History:  Diagnosis Date  . Anxiety   . Arthritis   . Carotid artery occlusion   . Chronic kidney disease May 2017   UTI  . Depression   . Diabetes ([place])   . Diverticulosis   . Fatty liver   . Fibromyalgia   . GERD (gastroesophageal reflux disease)   . H/O hiatal hernia   . Hypertension   . IBS (irritable bowel syndrome)   . Peptic ulcer   . Plantar fasciitis   . Stroke [facility]) Dec. 14,2013   Right side-ministroke    Past Surgical History:  Procedure Laterality Date  . ABDOMINAL HYSTERECTOMY  1990  . BACK SURGERY  2000, 2004  . CAROTID ENDARTERECTOMY Left 05/06/12  . CARPAL TUNNEL RELEASE Left 07/18/2015   Procedure: CARPAL TUNNEL RELEASE;  Surgeon: Earnestine Leys, MD;  Location: [facility] ORS;  Service: Orthopedics;  Laterality: Left;  . CEREBRAL  ANGIOGRAM Bilateral 05/03/2012   Procedure: CEREBRAL ANGIOGRAM;  Surgeon: Angelia Mould, MD;  Location[facility] CATH LAB;  Service: Cardiovascular;  Laterality: Bilateral;  . CHOLECYSTECTOMY  2001  . ENDARTERECTOMY Left 05/06/2012   Procedure: ENDARTERECTOMY CAROTID;  Surgeon: Angelia Mould, MD;  Location: [place];  Service: Vascular;  Laterality: Left;  . ENDARTERECTOMY Right 08/09/2013   Procedure: ENDARTERECTOMY CAROTID-RIGHT;  Surgeon: Angelia Mould, MD;  Location: [place];  Service: Vascular;  Laterality: Right;  . HERNIA REPAIR    . PATCH ANGIOPLASTY Left 05/06/2012   Procedure: WITH DACRON PATCH ANGIOPLASTY ;  Surgeon: Angelia Mould, MD;  Location: [place];  Service: Vascular;  Laterality: Left;  . [place]  2004  . TONSILLECTOMY    . TUBAL LIGATION      Prior to Admission medications   Medication Sig Start Date End Date Taking? Authorizing Provider  aspirin EC 81 MG tablet Take 81 mg by mouth daily.    [provider]  atorvastatin (LIPITOR) 10 MG tablet Take 10 mg by mouth daily at 6 PM.     [provider]  cetirizine (ZYRTEC) 10 MG tablet Take 10 mg by mouth daily.    [provider]  Cholecalciferol (VITAMIN D-3) 5000 UNITS TABS Take 5,000 Units by mouth daily.     [provider]  Continuous Blood Gluc Sensor (FREESTYLE LIBRE 14 DAY SENSOR) MISC USE AS DIRECTED 01/01/18   Ronnell Freshwater, NP  dicyclomine (BENTYL) 20 MG tablet Take 1 tablet (20 mg total) by mouth 4 (four) times daily -  before meals and at bedtime. 08/19/18   Ronnell Freshwater, NP  fluconazole (DIFLUCAN) 150 MG tablet TAKE 1 TABLET BY MOUTH ONCE FOR YEAST INFECTION. MAY REPEAT IN 3 DAYS FOR PERSISTENT SYMPTOMS 02/05/18   Ronnell Freshwater, NP  FLUoxetine (PROZAC) 40 MG capsule TAKE 2 CAPSULES BY MOUTH DAILY 04/28/18   Ronnell Freshwater, NP  gabapentin (NEURONTIN) 600 MG tablet TAKE 1 TABLET BY MOUTH THREE TIMES A DAY. 08/19/18   Boscia, Greer Ee, NP   hydrochlorothiazide (HYDRODIURIL) 12.5 MG tablet TAKE 1 TABLET BY MOUTH DAILY FOR BLOOD PRESSURE/EDEMA 08/19/18   Ronnell Freshwater, NP  insulin aspart (NOVOLOG) 100 UNIT/ML injection Sliding scale insulin BID prior to meals. Instructions provided.not to exceed 45 units daily. E11.65 08/19/18   Ronnell Freshwater, NP  insulin degludec (TRESIBA FLEXTOUCH) 100 UNIT/ML SOPN FlexTouch Pen Inject 0.6 mLs (60 Units total) into the skin daily. 08/19/18   Ronnell Freshwater, NP  Insulin Pen Needle (NOVOFINE) 32G X 6 MM MISC Use as directed with Victoza  Dx code E11.65 10/27/17   Ronnell Freshwater, NP  meloxicam (MOBIC) 15 MG tablet Take 1 tablet (15 mg total) by mouth daily. 08/19/18   Ronnell Freshwater, NP  metoprolol succinate (TOPROL-XL) 50 MG 24 hr tablet Take 1 tablet (50 mg total) by mouth daily. Take with or immediately following a meal. 08/19/18   Boscia, Greer Ee, NP  ondansetron (ZOFRAN ODT) 8 MG disintegrating tablet Take 1 tablet (8 mg total) by mouth every 8 (eight) hours as needed for nausea. 08/14/18   Arta Silence, MD  ONETOUCH DELICA LANCETS FINE MISC CHECK BLOOD SUGAR THREE TIMES A DAY 10/09/14   [provider]  [facility] VERIO test strip CHECK BLOOD SUGAR THREE TIMES A DAY 10/09/14   [provider]  Semaglutide,0.25 or 0.5MG /DOS, (OZEMPIC, 0.25 OR 0.5 MG/DOSE,) 2 MG/1.5ML SOPN Inject 0.5 mg into the skin once a week. 08/19/18   Ronnell Freshwater, NP  tiZANidine (ZANAFLEX) 4 MG tablet TAKE 1 TABLET(S) BY MOUTH TWICE A DAY AS NEEDED FOR MUSCLE PAIN AND SPASMS 08/19/18   Ronnell Freshwater, NP  traMADol (ULTRAM) 50 MG tablet Take 1 tablet (50 mg total) by mouth 3 (three) times daily as needed for moderate pain. 08/19/18   Boscia, Greer Ee, NP  VIBERZI 75 MG TABS Take 1 tablet by mouth 2 (two) times daily. 06/20/15   [provider]    Family History  Problem Relation Age of Onset  . Hypertension Mother   . Hyperlipidemia Mother   . Deep vein thrombosis Mother   . Cancer  Father   . Alcohol abuse Father   . Hypertension Maternal Grandmother   . Deep vein thrombosis Sister   . Alcohol abuse Sister   . Alcohol abuse Sister      Social History   Tobacco Use  . Smoking status: Current Every Day Smoker    Packs/day: 1.00    Years: 40.00    Pack years: 40.00    Types: Cigarettes, E-cigarettes    Start date: 11/09/1970  . Smokeless tobacco: Never Used  . Tobacco comment: using vapor   Substance Use Topics  . Alcohol use: No    Alcohol/week: 0.0 standard drinks    Comment:  quite 2 years   . Drug use: No    Allergies as of 10/04/2018 - Review Complete 08/19/2018  Allergen Reaction Noted  . Simvastatin Diarrhea and Nausea And Vomiting 04/28/2012  . Morphine and related Itching 02/27/2012  . Latex Rash 04/30/2012    Review of Systems:    All systems reviewed and negative except where noted in HPI.   Physical Exam:  There were no vitals taken for this visit. No LMP recorded. Patient has had a hysterectomy. General:   Alert,  Well-developed, well-nourished, pleasant and cooperative in NAD Head:  Normocephalic and atraumatic. Eyes:  Sclera clear, no icterus.   Conjunctiva pink. Ears:  Normal auditory acuity. Nose:  No deformity, discharge, or lesions. Mouth:  No deformity or lesions,oropharynx pink & moist. Neck:  Supple; no masses or thyromegaly. Lungs:  Respirations even and unlabored.  Clear throughout to auscultation.   No wheezes, crackles, or rhonchi. No acute distress. Heart:  Regular rate and rhythm; no murmurs, clicks, rubs, or gallops. Abdomen:  Normal bowel sounds.  No bruits.  Soft, non-tender and non-distended without masses, hepatosplenomegaly or hernias noted.  No guarding or rebound tenderness.  Negative Carnett sign.   Rectal:  Deferred.  Msk:  Symmetrical without gross deformities.  Good, equal movement & strength bilaterally. Pulses:  Normal pulses noted. Extremities:  No clubbing or edema.  No cyanosis. Neurologic:  Alert  and oriented x3;  grossly normal neurologically. Skin:  Intact without significant lesions or rashes.  No jaundice. Lymph Nodes:  No significant cervical adenopathy. Psych:  Alert and cooperative. Normal mood and affect.  Imaging Studies: No results found.  Assessment and Plan:   Morgan Daniels is a 58 y.o. y/o female ***  Lucilla Lame, MD. Marval Regal    Note: This dictation was prepared with Dragon dictation along with smaller phrase technology. Any transcriptional errors that result from this process are unintentional.

## 2018-10-26 ENCOUNTER — Other Ambulatory Visit: Payer: Self-pay | Admitting: Nurse Practitioner

## 2018-10-26 DIAGNOSIS — F321 Major depressive disorder, single episode, moderate: Secondary | ICD-10-CM

## 2018-10-26 MED ORDER — FLUOXETINE HCL 40 MG PO CAPS: 80.0000 mg | ORAL_CAPSULE | Freq: Every day | ORAL | 1 refills | Status: DC

## 2018-11-10 ENCOUNTER — Encounter: Payer: Self-pay | Admitting: Gastroenterology

## 2018-11-10 ENCOUNTER — Ambulatory Visit (INDEPENDENT_AMBULATORY_CARE_PROVIDER_SITE_OTHER): Payer: Medicare Other | Admitting: Gastroenterology

## 2018-11-10 ENCOUNTER — Other Ambulatory Visit: Payer: Self-pay

## 2018-11-10 ENCOUNTER — Other Ambulatory Visit
Admission: RE | Admit: 2018-11-10 | Discharge: 2018-11-10 | Disposition: A | Discharge status: Home / Self Care | Payer: Medicare Other | Attending: Gastroenterology | Admitting: Gastroenterology

## 2018-11-10 VITALS — BP 158/78 | Temp 96.7°F | Ht 67.0 in | Wt 192.4 lb

## 2018-11-10 DIAGNOSIS — K580 Irritable bowel syndrome with diarrhea: Secondary | ICD-10-CM

## 2018-11-10 DIAGNOSIS — R140 Abdominal distension (gaseous): Secondary | ICD-10-CM

## 2018-11-10 MED ORDER — CHOLESTYRAMINE 4 G PO PACK: 4.0000 g | PACK | Freq: Four times a day (QID) | ORAL | 3 refills | Status: DC

## 2018-11-10 MED ORDER — GOLYTELY 236 G PO SOLR: ORAL | 0 refills | Status: DC

## 2018-11-10 NOTE — H&P (View-Only) (Signed)
Gastroenterology Consultation  Referring Provider:     Ronnell Freshwater, NP Primary Care Physician:  Lavera Guise, MD Primary Gastroenterologist:  Dr. Allen Norris     Reason for Consultation:     Diarrhea and abdominal pain        HPI:   Morgan Daniels is a 58 y.o. y/o female referred for consultation & management of diarrhea and abdominal pain by Dr. Humphrey Rolls, Timoteo Gaul, MD.  This patient comes in today after reporting that she has had chronic for over 30 years.  The patient had a colonoscopy in 2008 by Dr. Sonny Masters and at that time she reports everything was normal.  The patient also was seen in [facility] for the same issues.  The patient states that she has frequent bouts of diarrhea and has been put on Zofran and dicyclomine without any help from either one of these.  The patient states that her symptoms got much worse after she had her gallbladder out.  There is no report of any unexplained weight loss fevers chills or vomiting.  The patient also denies any black stools or bloody stools.  There is no association with movement.  She reports the diarrhea always happens in the morning and usually lasts till 11:00 but sometimes will last until 3:00 in the afternoon.  She denies the diarrhea waking her up from a sound sleep.  The patient abdominal pain is diffuse.  The patient also reports that her symptoms are not made any better or worse with stress although she states she is under a lot of stress.  She continues to take the dicyclomine because she states she is desperate for anything to help her.  Past Medical History:  Diagnosis Date  . Anxiety   . Arthritis   . Carotid artery occlusion   . Chronic kidney disease May 2017   UTI  . Depression   . Diabetes ([place])   . Diverticulosis   . Fatty liver   . Fibromyalgia   . GERD (gastroesophageal reflux disease)   . H/O hiatal hernia   . Hypertension   . IBS (irritable bowel syndrome)   . Peptic ulcer   . Plantar fasciitis   . Stroke [facility]) Dec.  14,2013   Right side-ministroke    Past Surgical History:  Procedure Laterality Date  . ABDOMINAL HYSTERECTOMY  1990  . BACK SURGERY  2000, 2004  . CAROTID ENDARTERECTOMY Left 05/06/12  . CARPAL TUNNEL RELEASE Left 07/18/2015   Procedure: CARPAL TUNNEL RELEASE;  Surgeon: Earnestine Leys, MD;  Location: [facility] ORS;  Service: Orthopedics;  Laterality: Left;  . CEREBRAL ANGIOGRAM Bilateral 05/03/2012   Procedure: CEREBRAL ANGIOGRAM;  Surgeon: Angelia Mould, MD;  Location: [facility] CATH LAB;  Service: Cardiovascular;  Laterality: Bilateral;  . CHOLECYSTECTOMY  2001  . ENDARTERECTOMY Left 05/06/2012   Procedure: ENDARTERECTOMY CAROTID;  Surgeon: Angelia Mould, MD;  Location: [place];  Service: Vascular;  Laterality: Left;  . ENDARTERECTOMY Right 08/09/2013   Procedure: ENDARTERECTOMY CAROTID-RIGHT;  Surgeon: Angelia Mould, MD;  Location: [place];  Service: Vascular;  Laterality: Right;  . HERNIA REPAIR    . PATCH ANGIOPLASTY Left 05/06/2012   Procedure: WITH DACRON PATCH ANGIOPLASTY ;  Surgeon: Angelia Mould, MD;  Location: [place];  Service: Vascular;  Laterality: Left;  . [place]  2004  . TONSILLECTOMY    . TUBAL LIGATION      Prior to Admission medications   Medication Sig Start Date End Date Taking? Authorizing  Provider  aspirin EC 81 MG tablet Take 81 mg by mouth daily.    [provider]  atorvastatin (LIPITOR) 10 MG tablet Take 10 mg by mouth daily at 6 PM.     [provider]  cetirizine (ZYRTEC) 10 MG tablet Take 10 mg by mouth daily.    [provider]  Cholecalciferol (VITAMIN D-3) 5000 UNITS TABS Take 5,000 Units by mouth daily.     [provider]  Continuous Blood Gluc Sensor (FREESTYLE LIBRE 14 DAY SENSOR) MISC USE AS DIRECTED 01/01/18   Ronnell Freshwater, NP  dicyclomine (BENTYL) 20 MG tablet Take 1 tablet (20 mg total) by mouth 4 (four) times daily -  before meals and at bedtime. 08/19/18   Ronnell Freshwater, NP   fluconazole (DIFLUCAN) 150 MG tablet TAKE 1 TABLET BY MOUTH ONCE FOR YEAST INFECTION. MAY REPEAT IN 3 DAYS FOR PERSISTENT SYMPTOMS 02/05/18   Ronnell Freshwater, NP  FLUoxetine (PROZAC) 40 MG capsule Take 2 capsules (80 mg total) by mouth daily. 10/26/18   Ronnell Freshwater, NP  gabapentin (NEURONTIN) 600 MG tablet TAKE 1 TABLET BY MOUTH THREE TIMES A DAY. 08/19/18   Boscia, Heather E, NP  hydrochlorothiazide (HYDRODIURIL) 12.5 MG tablet TAKE 1 TABLET BY MOUTH DAILY FOR BLOOD PRESSURE/EDEMA 08/19/18   Ronnell Freshwater, NP  insulin aspart (NOVOLOG) 100 UNIT/ML injection Sliding scale insulin BID prior to meals. Instructions provided.not to exceed 45 units daily. E11.65 08/19/18   Ronnell Freshwater, NP  insulin degludec (TRESIBA FLEXTOUCH) 100 UNIT/ML SOPN FlexTouch Pen Inject 0.6 mLs (60 Units total) into the skin daily. 08/19/18   Ronnell Freshwater, NP  Insulin Pen Needle (NOVOFINE) 32G X 6 MM MISC Use as directed with Victoza  Dx code E11.65 10/27/17   Ronnell Freshwater, NP  meloxicam (MOBIC) 15 MG tablet Take 1 tablet (15 mg total) by mouth daily. 08/19/18   Ronnell Freshwater, NP  metoprolol succinate (TOPROL-XL) 50 MG 24 hr tablet Take 1 tablet (50 mg total) by mouth daily. Take with or immediately following a meal. 08/19/18   Boscia, Greer Ee, NP  ondansetron (ZOFRAN ODT) 8 MG disintegrating tablet Take 1 tablet (8 mg total) by mouth every 8 (eight) hours as needed for nausea. 08/14/18   Arta Silence, MD  ONETOUCH DELICA LANCETS FINE MISC CHECK BLOOD SUGAR THREE TIMES A DAY 10/09/14   [provider]  [facility] VERIO test strip CHECK BLOOD SUGAR THREE TIMES A DAY 10/09/14   [provider]  Semaglutide,0.25 or 0.5MG /DOS, (OZEMPIC, 0.25 OR 0.5 MG/DOSE,) 2 MG/1.5ML SOPN Inject 0.5 mg into the skin once a week. 08/19/18   Ronnell Freshwater, NP  tiZANidine (ZANAFLEX) 4 MG tablet TAKE 1 TABLET(S) BY MOUTH TWICE A DAY AS NEEDED FOR MUSCLE PAIN AND SPASMS 08/19/18   Ronnell Freshwater, NP  traMADol  (ULTRAM) 50 MG tablet Take 1 tablet (50 mg total) by mouth 3 (three) times daily as needed for moderate pain. 08/19/18   Boscia, Greer Ee, NP  VIBERZI 75 MG TABS Take 1 tablet by mouth 2 (two) times daily. 06/20/15   [provider]    Family History  Problem Relation Age of Onset  . Hypertension Mother   . Hyperlipidemia Mother   . Deep vein thrombosis Mother   . Cancer Father   . Alcohol abuse Father   . Hypertension Maternal Grandmother   . Deep vein thrombosis Sister   . Alcohol abuse Sister   . Alcohol  abuse Sister      Social History   Tobacco Use  . Smoking status: Current Every Day Smoker    Packs/day: 1.00    Years: 40.00    Pack years: 40.00    Types: Cigarettes, E-cigarettes    Start date: 11/09/1970  . Smokeless tobacco: Never Used  . Tobacco comment: using vapor   Substance Use Topics  . Alcohol use: No    Alcohol/week: 0.0 standard drinks    Comment:   quite 2 years   . Drug use: No    Allergies as of 11/10/2018 - Review Complete 08/19/2018  Allergen Reaction Noted  . Simvastatin Diarrhea and Nausea And Vomiting 04/28/2012  . Morphine and related Itching 02/27/2012  . Latex Rash 04/30/2012    Review of Systems:    All systems reviewed and negative except where noted in HPI.   Physical Exam:  There were no vitals taken for this visit. No LMP recorded. Patient has had a hysterectomy. General:   Alert,  Well-developed, well-nourished, pleasant and cooperative in NAD Head:  Normocephalic and atraumatic. Eyes:  Sclera clear, no icterus.   Conjunctiva pink. Ears:  Normal auditory acuity. Nose:  No deformity, discharge, or lesions. Mouth:  No deformity or lesions,oropharynx pink & moist. Neck:  Supple; no masses or thyromegaly. Lungs:  Respirations even and unlabored.  Clear throughout to auscultation.   No wheezes, crackles, or rhonchi. No acute distress. Heart:  Regular rate and rhythm; no murmurs, clicks, rubs, or gallops. Abdomen:  Normal bowel  sounds.  No bruits.  Soft, diffuse abdominal pain and non-distended without masses, hepatosplenomegaly or hernias noted.  No guarding or rebound tenderness.  Negative Carnett sign.   Rectal:  Deferred.  Msk:  Symmetrical without gross deformities.  Good, equal movement & strength bilaterally. Pulses:  Normal pulses noted. Extremities:  No clubbing or edema.  No cyanosis. Neurologic:  Alert and oriented x3;  grossly normal neurologically. Skin:  Intact without significant lesions or rashes.  No jaundice. Lymph Nodes:  No significant cervical adenopathy. Psych:  Alert and cooperative. Normal mood and affect.  Imaging Studies: No results found.  Assessment and Plan:   Morgan Daniels is a 58 y.o. y/o female who comes in today with a history of bowel problems that she states has been present for many years.  The patient symptoms sound more like irritable bowel syndrome than anything else.  Despite that the patient will be set up for a colonoscopy due to her change in bowel habits and bloating.  The patient will also have her blood sent off for a celiac sprue panel and a breath test for bacterial overgrowth.  The patient will also be given a trial of cholestyramine to see if that helps her diarrhea since it was exacerbated after having her gallbladder out.  The patient has been explained the plan and agrees with it.  Lucilla Lame, MD. Marval Regal    Note: This dictation was prepared with Dragon dictation along with smaller phrase technology. Any transcriptional errors that result from this process are unintentional.

## 2018-11-10 NOTE — Progress Notes (Signed)
Gastroenterology Consultation  Referring Provider:     Ronnell Freshwater, NP Primary Care Physician:  Lavera Guise, MD Primary Gastroenterologist:  Dr. Allen Norris     Reason for Consultation:     Diarrhea and abdominal pain        HPI:   Morgan Daniels is a 58 y.o. y/o female referred for consultation & management of diarrhea and abdominal pain by Dr. Humphrey Rolls, Timoteo Gaul, MD.  This patient comes in today after reporting that she has had chronic for over 30 years.  The patient had a colonoscopy in 2008 by Dr. Sonny Masters and at that time she reports everything was normal.  The patient also was seen in [facility] for the same issues.  The patient states that she has frequent bouts of diarrhea and has been put on Zofran and dicyclomine without any help from either one of these.  The patient states that her symptoms got much worse after she had her gallbladder out.  There is no report of any unexplained weight loss fevers chills or vomiting.  The patient also denies any black stools or bloody stools.  There is no association with movement.  She reports the diarrhea always happens in the morning and usually lasts till 11:00 but sometimes will last until 3:00 in the afternoon.  She denies the diarrhea waking her up from a sound sleep.  The patient abdominal pain is diffuse.  The patient also reports that her symptoms are not made any better or worse with stress although she states she is under a lot of stress.  She continues to take the dicyclomine because she states she is desperate for anything to help her.  Past Medical History:  Diagnosis Date  . Anxiety   . Arthritis   . Carotid artery occlusion   . Chronic kidney disease May 2017   UTI  . Depression   . Diabetes ([place])   . Diverticulosis   . Fatty liver   . Fibromyalgia   . GERD (gastroesophageal reflux disease)   . H/O hiatal hernia   . Hypertension   . IBS (irritable bowel syndrome)   . Peptic ulcer   . Plantar fasciitis   . Stroke [facility]) Dec.  14,2013   Right side-ministroke    Past Surgical History:  Procedure Laterality Date  . ABDOMINAL HYSTERECTOMY  1990  . BACK SURGERY  2000, 2004  . CAROTID ENDARTERECTOMY Left 05/06/12  . CARPAL TUNNEL RELEASE Left 07/18/2015   Procedure: CARPAL TUNNEL RELEASE;  Surgeon: Earnestine Leys, MD;  Location: [facility] ORS;  Service: Orthopedics;  Laterality: Left;  . CEREBRAL ANGIOGRAM Bilateral 05/03/2012   Procedure: CEREBRAL ANGIOGRAM;  Surgeon: Angelia Mould, MD;  Location: [facility] CATH LAB;  Service: Cardiovascular;  Laterality: Bilateral;  . CHOLECYSTECTOMY  2001  . ENDARTERECTOMY Left 05/06/2012   Procedure: ENDARTERECTOMY CAROTID;  Surgeon: Angelia Mould, MD;  Location: [place];  Service: Vascular;  Laterality: Left;  . ENDARTERECTOMY Right 08/09/2013   Procedure: ENDARTERECTOMY CAROTID-RIGHT;  Surgeon: Angelia Mould, MD;  Location: [place];  Service: Vascular;  Laterality: Right;  . HERNIA REPAIR    . PATCH ANGIOPLASTY Left 05/06/2012   Procedure: WITH DACRON PATCH ANGIOPLASTY ;  Surgeon: Angelia Mould, MD;  Location: [place];  Service: Vascular;  Laterality: Left;  . [place]  2004  . TONSILLECTOMY    . TUBAL LIGATION      Prior to Admission medications   Medication Sig Start Date End Date Taking? Authorizing  Provider  aspirin EC 81 MG tablet Take 81 mg by mouth daily.    [provider]  atorvastatin (LIPITOR) 10 MG tablet Take 10 mg by mouth daily at 6 PM.     [provider]  cetirizine (ZYRTEC) 10 MG tablet Take 10 mg by mouth daily.    [provider]  Cholecalciferol (VITAMIN D-3) 5000 UNITS TABS Take 5,000 Units by mouth daily.     [provider]  Continuous Blood Gluc Sensor (FREESTYLE LIBRE 14 DAY SENSOR) MISC USE AS DIRECTED 01/01/18   Ronnell Freshwater, NP  dicyclomine (BENTYL) 20 MG tablet Take 1 tablet (20 mg total) by mouth 4 (four) times daily -  before meals and at bedtime. 08/19/18   Ronnell Freshwater, NP   fluconazole (DIFLUCAN) 150 MG tablet TAKE 1 TABLET BY MOUTH ONCE FOR YEAST INFECTION. MAY REPEAT IN 3 DAYS FOR PERSISTENT SYMPTOMS 02/05/18   Ronnell Freshwater, NP  FLUoxetine (PROZAC) 40 MG capsule Take 2 capsules (80 mg total) by mouth daily. 10/26/18   Ronnell Freshwater, NP  gabapentin (NEURONTIN) 600 MG tablet TAKE 1 TABLET BY MOUTH THREE TIMES A DAY. 08/19/18   Boscia, Heather E, NP  hydrochlorothiazide (HYDRODIURIL) 12.5 MG tablet TAKE 1 TABLET BY MOUTH DAILY FOR BLOOD PRESSURE/EDEMA 08/19/18   Ronnell Freshwater, NP  insulin aspart (NOVOLOG) 100 UNIT/ML injection Sliding scale insulin BID prior to meals. Instructions provided.not to exceed 45 units daily. E11.65 08/19/18   Ronnell Freshwater, NP  insulin degludec (TRESIBA FLEXTOUCH) 100 UNIT/ML SOPN FlexTouch Pen Inject 0.6 mLs (60 Units total) into the skin daily. 08/19/18   Ronnell Freshwater, NP  Insulin Pen Needle (NOVOFINE) 32G X 6 MM MISC Use as directed with Victoza  Dx code E11.65 10/27/17   Ronnell Freshwater, NP  meloxicam (MOBIC) 15 MG tablet Take 1 tablet (15 mg total) by mouth daily. 08/19/18   Ronnell Freshwater, NP  metoprolol succinate (TOPROL-XL) 50 MG 24 hr tablet Take 1 tablet (50 mg total) by mouth daily. Take with or immediately following a meal. 08/19/18   Boscia, Greer Ee, NP  ondansetron (ZOFRAN ODT) 8 MG disintegrating tablet Take 1 tablet (8 mg total) by mouth every 8 (eight) hours as needed for nausea. 08/14/18   Arta Silence, MD  ONETOUCH DELICA LANCETS FINE MISC CHECK BLOOD SUGAR THREE TIMES A DAY 10/09/14   [provider]  [facility] VERIO test strip CHECK BLOOD SUGAR THREE TIMES A DAY 10/09/14   [provider]  Semaglutide,0.25 or 0.5MG /DOS, (OZEMPIC, 0.25 OR 0.5 MG/DOSE,) 2 MG/1.5ML SOPN Inject 0.5 mg into the skin once a week. 08/19/18   Ronnell Freshwater, NP  tiZANidine (ZANAFLEX) 4 MG tablet TAKE 1 TABLET(S) BY MOUTH TWICE A DAY AS NEEDED FOR MUSCLE PAIN AND SPASMS 08/19/18   Ronnell Freshwater, NP  traMADol  (ULTRAM) 50 MG tablet Take 1 tablet (50 mg total) by mouth 3 (three) times daily as needed for moderate pain. 08/19/18   Boscia, Greer Ee, NP  VIBERZI 75 MG TABS Take 1 tablet by mouth 2 (two) times daily. 06/20/15   [provider]    Family History  Problem Relation Age of Onset  . Hypertension Mother   . Hyperlipidemia Mother   . Deep vein thrombosis Mother   . Cancer Father   . Alcohol abuse Father   . Hypertension Maternal Grandmother   . Deep vein thrombosis Sister   . Alcohol abuse Sister   . Alcohol  abuse Sister      Social History   Tobacco Use  . Smoking status: Current Every Day Smoker    Packs/day: 1.00    Years: 40.00    Pack years: 40.00    Types: Cigarettes, E-cigarettes    Start date: 11/09/1970  . Smokeless tobacco: Never Used  . Tobacco comment: using vapor   Substance Use Topics  . Alcohol use: No    Alcohol/week: 0.0 standard drinks    Comment:   quite 2 years   . Drug use: No    Allergies as of 11/10/2018 - Review Complete 08/19/2018  Allergen Reaction Noted  . Simvastatin Diarrhea and Nausea And Vomiting 04/28/2012  . Morphine and related Itching 02/27/2012  . Latex Rash 04/30/2012    Review of Systems:    All systems reviewed and negative except where noted in HPI.   Physical Exam:  There were no vitals taken for this visit. No LMP recorded. Patient has had a hysterectomy. General:   Alert,  Well-developed, well-nourished, pleasant and cooperative in NAD Head:  Normocephalic and atraumatic. Eyes:  Sclera clear, no icterus.   Conjunctiva pink. Ears:  Normal auditory acuity. Nose:  No deformity, discharge, or lesions. Mouth:  No deformity or lesions,oropharynx pink & moist. Neck:  Supple; no masses or thyromegaly. Lungs:  Respirations even and unlabored.  Clear throughout to auscultation.   No wheezes, crackles, or rhonchi. No acute distress. Heart:  Regular rate and rhythm; no murmurs, clicks, rubs, or gallops. Abdomen:  Normal bowel  sounds.  No bruits.  Soft, diffuse abdominal pain and non-distended without masses, hepatosplenomegaly or hernias noted.  No guarding or rebound tenderness.  Negative Carnett sign.   Rectal:  Deferred.  Msk:  Symmetrical without gross deformities.  Good, equal movement & strength bilaterally. Pulses:  Normal pulses noted. Extremities:  No clubbing or edema.  No cyanosis. Neurologic:  Alert and oriented x3;  grossly normal neurologically. Skin:  Intact without significant lesions or rashes.  No jaundice. Lymph Nodes:  No significant cervical adenopathy. Psych:  Alert and cooperative. Normal mood and affect.  Imaging Studies: No results found.  Assessment and Plan:   Morgan Daniels is a 58 y.o. y/o female who comes in today with a history of bowel problems that she states has been present for many years.  The patient symptoms sound more like irritable bowel syndrome than anything else.  Despite that the patient will be set up for a colonoscopy due to her change in bowel habits and bloating.  The patient will also have her blood sent off for a celiac sprue panel and a breath test for bacterial overgrowth.  The patient will also be given a trial of cholestyramine to see if that helps her diarrhea since it was exacerbated after having her gallbladder out.  The patient has been explained the plan and agrees with it.  Lucilla Lame, MD. Marval Regal    Note: This dictation was prepared with Dragon dictation along with smaller phrase technology. Any transcriptional errors that result from this process are unintentional.

## 2018-11-12 ENCOUNTER — Other Ambulatory Visit: Payer: Self-pay

## 2018-11-12 DIAGNOSIS — M7540 Impingement syndrome of unspecified shoulder: Secondary | ICD-10-CM

## 2018-11-12 LAB — CELIAC DISEASE PANEL
Endomysial Ab, IgA: NEGATIVE
IgA: 230 mg/dL (ref 87–352)
Tissue Transglutaminase Ab, IgA: <2 U/mL (ref 0–3)

## 2018-11-12 MED ORDER — TIZANIDINE HCL 4 MG PO TABS: ORAL_TABLET | ORAL | 2 refills | Status: DC

## 2018-11-15 ENCOUNTER — Other Ambulatory Visit: Payer: Self-pay

## 2018-11-15 ENCOUNTER — Encounter: Payer: Self-pay | Admitting: *Deleted

## 2018-11-16 ENCOUNTER — Other Ambulatory Visit
Admission: RE | Admit: 2018-11-16 | Discharge: 2018-11-16 | Disposition: A | Discharge status: Home / Self Care | Payer: Medicare Other | Source: Ambulatory Visit | Attending: Gastroenterology | Admitting: Gastroenterology

## 2018-11-16 ENCOUNTER — Telehealth: Payer: Self-pay

## 2018-11-16 DIAGNOSIS — Z01812 Encounter for preprocedural laboratory examination: Secondary | ICD-10-CM | POA: Insufficient documentation

## 2018-11-16 DIAGNOSIS — Z20828 Contact with and (suspected) exposure to other viral communicable diseases: Secondary | ICD-10-CM | POA: Diagnosis not present

## 2018-11-16 LAB — SARS CORONAVIRUS 2 (TAT 6-24 HRS): SARS Coronavirus 2: NEGATIVE

## 2018-11-16 NOTE — Telephone Encounter (Signed)
-----   Message from Lucilla Lame, MD sent at 11/12/2018  4:04 PM EDT ----- Let th e patient know the sprue test was negative.

## 2018-11-16 NOTE — Telephone Encounter (Signed)
MyChart message has been sent to patient regarding lab results.

## 2018-11-17 NOTE — Anesthesia Preprocedure Evaluation (Addendum)
Anesthesia Evaluation  Patient identified by MRN, date of birth, ID band Patient awake    Reviewed: Allergy & Precautions, NPO status , Patient's Chart, lab work & pertinent test results  History of Anesthesia Complications Negative for: history of anesthetic complications  Airway Mallampati: III   Neck ROM: Full    Dental  (+)    Pulmonary Current Smoker (1 ppd)Patient did not abstain from smoking.,    Pulmonary exam normal breath sounds clear to auscultation       Cardiovascular hypertension, + Peripheral Vascular Disease  Normal cardiovascular exam Rhythm:Regular Rate:Normal  ECG 08/14/18: normal   Neuro/Psych PSYCHIATRIC DISORDERS Anxiety Depression CVA (2013), No Residual Symptoms    GI/Hepatic hiatal hernia, PUD, GERD  ,IBS   Endo/Other  diabetes, Type 2, Insulin Dependent  Renal/GU Renal disease (CKD)     Musculoskeletal  (+) Arthritis , Fibromyalgia -  Abdominal   Peds  Hematology negative hematology ROS (+)   Anesthesia Other Findings   Reproductive/Obstetrics                            Anesthesia Physical Anesthesia Plan  ASA: III  Anesthesia Plan: General   Post-op Pain Management:    Induction: Intravenous  PONV Risk Score and Plan: 2 and Propofol infusion and TIVA  Airway Management Planned: Natural Airway  Additional Equipment:   Intra-op Plan:   Post-operative Plan:   Informed Consent: I have reviewed the patients History and Physical, chart, labs and discussed the procedure including the risks, benefits and alternatives for the proposed anesthesia with the patient or authorized representative who has indicated his/her understanding and acceptance.       Plan Discussed with: CRNA  Anesthesia Plan Comments:        Anesthesia Quick Evaluation

## 2018-11-18 ENCOUNTER — Other Ambulatory Visit: Payer: Self-pay

## 2018-11-18 DIAGNOSIS — M7540 Impingement syndrome of unspecified shoulder: Secondary | ICD-10-CM

## 2018-11-18 DIAGNOSIS — E1165 Type 2 diabetes mellitus with hyperglycemia: Secondary | ICD-10-CM

## 2018-11-18 MED ORDER — TIZANIDINE HCL 4 MG PO TABS: ORAL_TABLET | ORAL | 2 refills | Status: DC

## 2018-11-18 MED ORDER — NOVOFINE 32G X 6 MM MISC: 11 refills | Status: DC

## 2018-11-18 MED ORDER — FLUCONAZOLE 150 MG PO TABS: ORAL_TABLET | ORAL | 1 refills | Status: DC

## 2018-11-18 NOTE — Discharge Instructions (Signed)

## 2018-11-19 ENCOUNTER — Encounter
Admission: RE | Disposition: A | Discharge status: Home / Self Care | Payer: Self-pay | Source: Home / Self Care | Attending: Gastroenterology

## 2018-11-19 ENCOUNTER — Ambulatory Visit
Admission: RE | Admit: 2018-11-19 | Discharge: 2018-11-19 | Disposition: A | Discharge status: Home / Self Care | Payer: Medicare Other | Attending: Gastroenterology | Admitting: Gastroenterology

## 2018-11-19 ENCOUNTER — Ambulatory Visit: Payer: Medicare Other | Admitting: Anesthesiology

## 2018-11-19 ENCOUNTER — Other Ambulatory Visit: Payer: Self-pay

## 2018-11-19 DIAGNOSIS — K5289 Other specified noninfective gastroenteritis and colitis: Secondary | ICD-10-CM | POA: Diagnosis not present

## 2018-11-19 DIAGNOSIS — M797 Fibromyalgia: Secondary | ICD-10-CM | POA: Diagnosis not present

## 2018-11-19 DIAGNOSIS — Z8673 Personal history of transient ischemic attack (TIA), and cerebral infarction without residual deficits: Secondary | ICD-10-CM | POA: Diagnosis not present

## 2018-11-19 DIAGNOSIS — Z79899 Other long term (current) drug therapy: Secondary | ICD-10-CM | POA: Insufficient documentation

## 2018-11-19 DIAGNOSIS — R197 Diarrhea, unspecified: Secondary | ICD-10-CM | POA: Diagnosis not present

## 2018-11-19 DIAGNOSIS — Z885 Allergy status to narcotic agent status: Secondary | ICD-10-CM | POA: Diagnosis not present

## 2018-11-19 DIAGNOSIS — E1151 Type 2 diabetes mellitus with diabetic peripheral angiopathy without gangrene: Secondary | ICD-10-CM | POA: Diagnosis not present

## 2018-11-19 DIAGNOSIS — Z794 Long term (current) use of insulin: Secondary | ICD-10-CM | POA: Diagnosis not present

## 2018-11-19 DIAGNOSIS — K760 Fatty (change of) liver, not elsewhere classified: Secondary | ICD-10-CM | POA: Insufficient documentation

## 2018-11-19 DIAGNOSIS — K640 First degree hemorrhoids: Secondary | ICD-10-CM | POA: Diagnosis not present

## 2018-11-19 DIAGNOSIS — Z791 Long term (current) use of non-steroidal anti-inflammatories (NSAID): Secondary | ICD-10-CM | POA: Diagnosis not present

## 2018-11-19 DIAGNOSIS — N189 Chronic kidney disease, unspecified: Secondary | ICD-10-CM | POA: Insufficient documentation

## 2018-11-19 DIAGNOSIS — F329 Major depressive disorder, single episode, unspecified: Secondary | ICD-10-CM | POA: Insufficient documentation

## 2018-11-19 DIAGNOSIS — Z8711 Personal history of peptic ulcer disease: Secondary | ICD-10-CM | POA: Diagnosis not present

## 2018-11-19 DIAGNOSIS — M1990 Unspecified osteoarthritis, unspecified site: Secondary | ICD-10-CM | POA: Insufficient documentation

## 2018-11-19 DIAGNOSIS — F17210 Nicotine dependence, cigarettes, uncomplicated: Secondary | ICD-10-CM | POA: Insufficient documentation

## 2018-11-19 DIAGNOSIS — Z7982 Long term (current) use of aspirin: Secondary | ICD-10-CM | POA: Diagnosis not present

## 2018-11-19 DIAGNOSIS — K529 Noninfective gastroenteritis and colitis, unspecified: Secondary | ICD-10-CM | POA: Insufficient documentation

## 2018-11-19 DIAGNOSIS — K5730 Diverticulosis of large intestine without perforation or abscess without bleeding: Secondary | ICD-10-CM | POA: Diagnosis not present

## 2018-11-19 DIAGNOSIS — Z91040 Latex allergy status: Secondary | ICD-10-CM | POA: Diagnosis not present

## 2018-11-19 DIAGNOSIS — K635 Polyp of colon: Secondary | ICD-10-CM | POA: Diagnosis not present

## 2018-11-19 DIAGNOSIS — E1122 Type 2 diabetes mellitus with diabetic chronic kidney disease: Secondary | ICD-10-CM | POA: Insufficient documentation

## 2018-11-19 DIAGNOSIS — F419 Anxiety disorder, unspecified: Secondary | ICD-10-CM | POA: Diagnosis not present

## 2018-11-19 DIAGNOSIS — I129 Hypertensive chronic kidney disease with stage 1 through stage 4 chronic kidney disease, or unspecified chronic kidney disease: Secondary | ICD-10-CM | POA: Diagnosis not present

## 2018-11-19 DIAGNOSIS — Z888 Allergy status to other drugs, medicaments and biological substances status: Secondary | ICD-10-CM | POA: Diagnosis not present

## 2018-11-19 HISTORY — PX: POLYPECTOMY: SHX5525

## 2018-11-19 HISTORY — PX: COLONOSCOPY WITH PROPOFOL: SHX5780

## 2018-11-19 LAB — GLUCOSE, CAPILLARY: Glucose-Capillary: 190 mg/dL — ABNORMAL HIGH (ref 70–99)

## 2018-11-19 SURGERY — COLONOSCOPY WITH PROPOFOL
Anesthesia: General | Site: Rectum

## 2018-11-19 MED ORDER — LACTATED RINGERS IV SOLN: 10.0000 mL/h | INTRAVENOUS | Status: DC

## 2018-11-19 MED ORDER — ACETAMINOPHEN 325 MG PO TABS: 650.0000 mg | ORAL_TABLET | Freq: Once | ORAL | Status: DC | PRN

## 2018-11-19 MED ORDER — PROPOFOL 10 MG/ML IV BOLUS
INTRAVENOUS | Status: DC | PRN
  Administered 2018-11-19: 60 mg via INTRAVENOUS
  Administered 2018-11-19: 50 mg via INTRAVENOUS
  Administered 2018-11-19 (×3): 40 mg via INTRAVENOUS

## 2018-11-19 MED ORDER — STERILE WATER FOR IRRIGATION IR SOLN
Status: DC | PRN
  Administered 2018-11-19: .05 mL

## 2018-11-19 MED ORDER — ACETAMINOPHEN 160 MG/5ML PO SOLN: 325.0000 mg | ORAL | Status: DC | PRN

## 2018-11-19 MED ORDER — LIDOCAINE HCL (CARDIAC) PF 100 MG/5ML IV SOSY
PREFILLED_SYRINGE | INTRAVENOUS | Status: DC | PRN
  Administered 2018-11-19: 40 mg via INTRAVENOUS

## 2018-11-19 MED ORDER — ONDANSETRON HCL 4 MG/2ML IJ SOLN: 4.0000 mg | Freq: Once | INTRAMUSCULAR | Status: DC | PRN

## 2018-11-19 MED ORDER — LACTATED RINGERS IV SOLN
INTRAVENOUS | Status: DC | PRN
  Administered 2018-11-19: 08:00:00 via INTRAVENOUS

## 2018-11-19 SURGICAL SUPPLY — 8 items
CANISTER SUCT 1200ML W/VALVE (MISCELLANEOUS) ×4 IMPLANT
FORCEPS BIOP RAD 4 LRG CAP 4 (CUTTING FORCEPS) ×2 IMPLANT
GOWN CVR UNV OPN BCK APRN NK (MISCELLANEOUS) ×4 IMPLANT
GOWN ISOL THUMB LOOP REG UNIV (MISCELLANEOUS) ×8
KIT ENDO PROCEDURE OLY (KITS) ×4 IMPLANT
SNARE SHORT THROW 13M SML OVAL (MISCELLANEOUS) ×2 IMPLANT
TRAP ETRAP POLY (MISCELLANEOUS) ×2 IMPLANT
WATER STERILE IRR 250ML POUR (IV SOLUTION) ×4 IMPLANT

## 2018-11-19 NOTE — Interval H&P Note (Signed)
History and Physical Interval Note:  11/19/2018 7:44 AM  Morgan Daniels  has presented today for surgery, with the diagnosis of Diarrhea R19.7.  The various methods of treatment have been discussed with the patient and family. After consideration of risks, benefits and other options for treatment, the patient has consented to  Procedure(s) with comments: COLONOSCOPY WITH PROPOFOL (N/A) - Diabetic - insulin as a surgical intervention.  The patient's history has been reviewed, patient examined, no change in status, stable for surgery.  I have reviewed the patient's chart and labs.  Questions were answered to the patient's satisfaction.     Merilyn Pagan [employer]

## 2018-11-19 NOTE — Op Note (Signed)
[facility] Patient Name: Morgan Daniels Procedure Date: 11/19/2018 8:31 AM MRN: [id number] Account #: [id number] Date of Birth: [birth year] Admit Type: Outpatient Age: 58 Room: [facility] OR ROOM 01 Gender: Female Note Status: Finalized Procedure:            Colonoscopy Indications:          Chronic diarrhea Providers:            Lucilla Lame MD, MD Referring MD:         Lavera Guise, MD (Referring MD) Medicines:            Propofol per Anesthesia Complications:        No immediate complications. Procedure:            Pre-Anesthesia Assessment:                       - Prior to the procedure, a History and Physical was                        performed, and patient medications and allergies were                        reviewed. The patient's tolerance of previous                        anesthesia was also reviewed. The risks and benefits of                        the procedure and the sedation options and risks were                        discussed with the patient. All questions were                        answered, and informed consent was obtained. Prior                        Anticoagulants: The patient has taken no previous                        anticoagulant or antiplatelet agents. ASA Grade                        Assessment: II - A patient with mild systemic disease.                        After reviewing the risks and benefits, the patient was                        deemed in satisfactory condition to undergo the                        procedure.                       After obtaining informed consent, the colonoscope was                        passed under direct vision. Throughout the procedure,  the patient's blood pressure, pulse, and oxygen                        saturations were monitored continuously. The                        Colonoscope was introduced through the anus and                        advanced to the the  cecum, identified by appendiceal                        orifice and ileocecal valve. The colonoscopy was                        performed without difficulty. The patient tolerated the                        procedure well. The quality of the bowel preparation                        was excellent. Findings:      The perianal and digital rectal examinations were normal.      A 3 mm polyp was found in the sigmoid colon. The polyp was sessile. The       polyp was removed with a cold biopsy forceps. Resection and retrieval       were complete.      A 7 mm polyp was found in the sigmoid colon. The polyp was sessile. The       polyp was removed with a cold snare. Resection and retrieval were       complete.      Multiple small-mouthed diverticula were found in the sigmoid colon.      Non-bleeding internal hemorrhoids were found during retroflexion. The       hemorrhoids were Grade I (internal hemorrhoids that do not prolapse).      Multiple biopsies were obtained with cold forceps for histology randomly       in the entire colon. Impression:           - One 3 mm polyp in the sigmoid colon, removed with a                        cold biopsy forceps. Resected and retrieved.                       - One 7 mm polyp in the sigmoid colon, removed with a                        cold snare. Resected and retrieved.                       - Diverticulosis in the sigmoid colon.                       - Non-bleeding internal hemorrhoids.                       - Multiple biopsies were obtained in the entire colon. Recommendation:       - Discharge patient to home.                       -  Resume previous diet.                       - Continue present medications.                       - Await pathology results.                       - Repeat colonoscopy in 5 years if polyp adenoma and 10                        years if hyperplastic Procedure Code(s):    --- Professional ---                       [phone],  Colonoscopy, flexible; with removal of tumor(s),                        polyp(s), or other lesion(s) by snare technique                       [id number], 67, Colonoscopy, flexible; with biopsy, single                        or multiple Diagnosis Code(s):    --- Professional ---                       K52.9, Noninfective gastroenteritis and colitis,                        unspecified                       K63.5, Polyp of colon CPT copyright 2019 American Medical Association. All rights reserved. The codes documented in this report are preliminary and upon coder review may  be revised to meet current compliance requirements. Lucilla Lame MD, MD 11/19/2018 9:00:00 AM This report has been signed electronically. Number of Addenda: 0 Note Initiated On: 11/19/2018 8:31 AM Scope Withdrawal Time: 0 hours 9 minutes 36 seconds  Total Procedure Duration: 0 hours 14 minutes 41 seconds  Estimated Blood Loss: Estimated blood loss: none.      [facility]

## 2018-11-19 NOTE — Anesthesia Postprocedure Evaluation (Signed)
Anesthesia Post Note  Patient: Morgan Daniels  Procedure(s) Performed: COLONOSCOPY WITH PROPOFOL (N/A Rectum) POLYPECTOMY (Rectum)  Patient location during evaluation: PACU Anesthesia Type: General Level of consciousness: awake and alert, oriented and patient cooperative Pain management: pain level controlled Vital Signs Assessment: post-procedure vital signs reviewed and stable Respiratory status: spontaneous breathing, nonlabored ventilation and respiratory function stable Cardiovascular status: blood pressure returned to baseline and stable Postop Assessment: adequate PO intake Anesthetic complications: no    Darrin Nipper

## 2018-11-19 NOTE — Transfer of Care (Signed)
Immediate Anesthesia Transfer of Care Note  Patient: Morgan Daniels  Procedure(s) Performed: COLONOSCOPY WITH PROPOFOL (N/A Rectum) POLYPECTOMY (Rectum)  Patient Location: PACU  Anesthesia Type: General  Level of Consciousness: awake, alert  and patient cooperative  Airway and Oxygen Therapy: Patient Spontanous Breathing and Patient connected to supplemental oxygen  Post-op Assessment: Post-op Vital signs reviewed, Patient's Cardiovascular Status Stable, Respiratory Function Stable, Patent Airway and No signs of Nausea or vomiting  Post-op Vital Signs: Reviewed and stable  Complications: No apparent anesthesia complications

## 2018-11-22 ENCOUNTER — Encounter: Payer: Self-pay | Admitting: Gastroenterology

## 2018-11-22 LAB — GLUCOSE, CAPILLARY: Glucose-Capillary: 146 mg/dL — ABNORMAL HIGH (ref 70–99)

## 2018-11-23 ENCOUNTER — Encounter: Payer: Self-pay | Admitting: Gastroenterology

## 2018-12-20 ENCOUNTER — Other Ambulatory Visit: Payer: Self-pay

## 2018-12-20 ENCOUNTER — Ambulatory Visit (INDEPENDENT_AMBULATORY_CARE_PROVIDER_SITE_OTHER): Payer: Medicare Other | Admitting: Nurse Practitioner

## 2018-12-20 ENCOUNTER — Encounter: Payer: Self-pay | Admitting: Nurse Practitioner

## 2018-12-20 VITALS — BP 146/90 | HR 77 | Temp 97.8°F | Resp 16 | Ht 67.0 in | Wt 193.2 lb

## 2018-12-20 DIAGNOSIS — E1165 Type 2 diabetes mellitus with hyperglycemia: Secondary | ICD-10-CM | POA: Diagnosis not present

## 2018-12-20 DIAGNOSIS — I10 Essential (primary) hypertension: Secondary | ICD-10-CM

## 2018-12-20 DIAGNOSIS — Z23 Encounter for immunization: Secondary | ICD-10-CM

## 2018-12-20 DIAGNOSIS — Z794 Long term (current) use of insulin: Secondary | ICD-10-CM | POA: Diagnosis not present

## 2018-12-20 DIAGNOSIS — M064 Inflammatory polyarthropathy: Secondary | ICD-10-CM

## 2018-12-20 DIAGNOSIS — Z0001 Encounter for general adult medical examination with abnormal findings: Secondary | ICD-10-CM

## 2018-12-20 DIAGNOSIS — R300 Dysuria: Secondary | ICD-10-CM | POA: Diagnosis not present

## 2018-12-20 DIAGNOSIS — Z1231 Encounter for screening mammogram for malignant neoplasm of breast: Secondary | ICD-10-CM

## 2018-12-20 LAB — POCT GLYCOSYLATED HEMOGLOBIN (HGB A1C): Hemoglobin A1C: 9 % — AB (ref 4.0–5.6)

## 2018-12-20 MED ORDER — TRAMADOL HCL 50 MG PO TABS: 50.0000 mg | ORAL_TABLET | Freq: Three times a day (TID) | ORAL | 3 refills | Status: DC | PRN

## 2018-12-20 MED ORDER — MELOXICAM 15 MG PO TABS: 15.0000 mg | ORAL_TABLET | Freq: Every day | ORAL | 3 refills | Status: DC

## 2018-12-20 NOTE — Progress Notes (Signed)
[facility] [place], [place] [address]  Internal MEDICINE  Office Visit Note  Patient Name: Morgan Daniels  [id number]  [id number]  Date of Service: 01/03/2019   Pt is here for routine health maintenance examination   Chief Complaint  Patient presents with  . Annual Exam  . Gynecologic Exam  . Diabetes  . Hypertension  . Gastroesophageal Reflux  . Quality Metric Gaps    diabetic foot and eye exam   . Depression    father passed away last week      The patient is here for health maintenance exam. Blood sugars are elevated today. Has been eating sweets and comfort foods after the death of her father. Got very off track. She is due to have diabetic eye exam. She had screening colonoscopy 11/19/2018. She had two, small polyps which were biopsied, and has diverticulosis. She should have another colonoscopy in five years. She is due to have screening mammogram and she would like to get a flu shot today.    Current Medication: Outpatient Encounter Medications as of 12/20/2018  Medication Sig Note  . cetirizine (ZYRTEC) 10 MG tablet Take 10 mg by mouth daily.   . Cholecalciferol (VITAMIN D-3) 5000 UNITS TABS Take 5,000 Units by mouth daily.    . cholestyramine (QUESTRAN) 4 g packet Take 1 packet (4 g total) by mouth 4 (four) times daily.   . Continuous Blood Gluc Sensor (FREESTYLE LIBRE 14 DAY SENSOR) MISC USE AS DIRECTED   . dicyclomine (BENTYL) 20 MG tablet Take 1 tablet (20 mg total) by mouth 4 (four) times daily -  before meals and at bedtime.   [name] FLUoxetine (PROZAC) 40 MG capsule Take 2 capsules (80 mg total) by mouth daily.   [name] gabapentin (NEURONTIN) 600 MG tablet TAKE 1 TABLET BY MOUTH THREE TIMES A DAY.   . hydrochlorothiazide (HYDRODIURIL) 12.5 MG tablet TAKE 1 TABLET BY MOUTH DAILY FOR BLOOD PRESSURE/EDEMA   . insulin aspart (NOVOLOG) 100 UNIT/ML injection Sliding scale insulin BID prior to meals. Instructions provided.not to exceed 45 units daily.  E11.65   . insulin degludec (TRESIBA FLEXTOUCH) 100 UNIT/ML SOPN FlexTouch Pen Inject 0.6 mLs (60 Units total) into the skin daily.   . Insulin Pen Needle (NOVOFINE) 32G X 6 MM MISC Use as directed with Victoza  Dx code E11.65   . meloxicam (MOBIC) 15 MG tablet Take 1 tablet (15 mg total) by mouth daily.   . metoprolol succinate (TOPROL-XL) 50 MG 24 hr tablet Take 1 tablet (50 mg total) by mouth daily. Take with or immediately following a meal.   . omeprazole (PRILOSEC) 20 MG capsule Take 20 mg by mouth daily as needed.   . ondansetron (ZOFRAN ODT) 8 MG disintegrating tablet Take 1 tablet (8 mg total) by mouth every 8 (eight) hours as needed for nausea.   Glory Rosebush DELICA LANCETS FINE MISC CHECK BLOOD SUGAR THREE TIMES A DAY 11/09/2014: Received from: External Pharmacy  . ONETOUCH VERIO test strip CHECK BLOOD SUGAR THREE TIMES A DAY 11/09/2014: Received from: External Pharmacy  . polyethylene glycol (GOLYTELY) 236 g solution Drink one 8 oz glass every 20 mins until entire container is finished starting at 5:00pm on 11/18/18   . Semaglutide,0.25 or 0.5MG /DOS, (OZEMPIC, 0.25 OR 0.5 MG/DOSE,) 2 MG/1.5ML SOPN Inject 0.5 mg into the skin once a week.   [name] tiZANidine (ZANAFLEX) 4 MG tablet TAKE 1 TABLET(S) BY MOUTH TWICE A DAY AS NEEDED FOR MUSCLE PAIN AND SPASMS   .  traMADol (ULTRAM) 50 MG tablet Take 1 tablet (50 mg total) by mouth 3 (three) times daily as needed for moderate pain.   . [DISCONTINUED] meloxicam (MOBIC) 15 MG tablet Take 1 tablet (15 mg total) by mouth daily.   . [DISCONTINUED] traMADol (ULTRAM) 50 MG tablet Take 1 tablet (50 mg total) by mouth 3 (three) times daily as needed for moderate pain.   . [DISCONTINUED] fluconazole (DIFLUCAN) 150 MG tablet TAKE 1 TABLET BY MOUTH ONCE FOR YEAST INFECTION. MAY REPEAT IN 3 DAYS FOR PERSISTENT SYMPTOMS (Patient not taking: Reported on 12/20/2018)    No facility-administered encounter medications on file as of 12/20/2018.     Surgical History: Past  Surgical History:  Procedure Laterality Date  . ABDOMINAL HYSTERECTOMY  1990  . BACK SURGERY  2000, 2004  . CAROTID ENDARTERECTOMY Left 05/06/12  . CARPAL TUNNEL RELEASE Left 07/18/2015   Procedure: CARPAL TUNNEL RELEASE;  Surgeon: Earnestine Leys, MD;  Location: [facility] OR[facility];  Service: Orthopedics;  Laterality: Left;  . CEREBRAL ANGIOGRAM Bilateral 05/03/2012   Procedure: CEREBRAL ANGIOGRAM;  Surgeon: Angelia Mould, MD;  Location: [facility] CATH LAB;  Service: Cardiovascular;  Laterality: Bilateral;  . CHOLECYSTECTOMY  2001  . COLONOSCOPY WITH PROPOFOL N/A 11/19/2018   Procedure: COLONOSCOPY WITH PROPOFOL;  Surgeon: Lucilla Lame, MD;  Location: [place];  Service: Endoscopy;  Laterality: N/A;  Diabetic - insulin  . ENDARTERECTOMY Left 05/06/2012   Procedure: ENDARTERECTOMY CAROTID;  Surgeon: Angelia Mould, MD;  Location: [place];  Service: Vascular;  Laterality: Left;  . ENDARTERECTOMY Right 08/09/2013   Procedure: ENDARTERECTOMY CAROTID-RIGHT;  Surgeon: Angelia Mould, MD;  Location: [place];  Service: Vascular;  Laterality: Right;  . HERNIA REPAIR    . PATCH ANGIOPLASTY Left 05/06/2012   Procedure: WITH DACRON PATCH ANGIOPLASTY ;  Surgeon: Angelia Mould, MD;  Location: [place];  Service: Vascular;  Laterality: Left;  . POLYPECTOMY  11/19/2018   Procedure: POLYPECTOMY;  Surgeon: Lucilla Lame, MD;  Location: [place];  Service: Endoscopy;;  . [place]  2004  . TONSILLECTOMY    . TUBAL LIGATION      Medical History: Past Medical History:  Diagnosis Date  . Anxiety   . Arthritis   . Carotid artery occlusion   . Chronic kidney disease May 2017   UTI  . Depression   . Diabetes ([place])   . Diverticulosis   . Fatty liver   . Fibromyalgia   . GERD (gastroesophageal reflux disease)   . H/O hiatal hernia   . Hypertension   . IBS (irritable bowel syndrome)   . Peptic ulcer   . Plantar fasciitis   . Stroke [facility]) Dec. 14,2013   Right side-ministroke     Family History: Family History  Problem Relation Age of Onset  . Hypertension Mother   . Hyperlipidemia Mother   . Deep vein thrombosis Mother   . Cancer Father   . Alcohol abuse Father   . Hypertension Maternal Grandmother   . Deep vein thrombosis Sister   . Alcohol abuse Sister   . Alcohol abuse Sister       Review of Systems  Constitutional: Positive for fatigue. Negative for activity change, appetite change, chills, diaphoresis, fever and unexpected weight change.  HENT: Negative for congestion, ear discharge, ear pain, facial swelling, postnasal drip, rhinorrhea, sinus pressure, sneezing, sore throat, trouble swallowing and voice change.   Respiratory: Negative for apnea, chest tightness, shortness of breath and wheezing.   Cardiovascular: Negative for  chest pain and palpitations.  Gastrointestinal: Positive for diarrhea. Negative for abdominal pain, blood in stool, nausea and vomiting.  Endocrine: Negative for cold intolerance, heat intolerance, polyphagia and polyuria.       Elevated blood sugars over past few months. Admits to less healthy diet choices due to taking care of her father who passed away last week.   Musculoskeletal: Positive for arthralgias, back pain and myalgias.  Allergic/Immunologic: Negative for environmental allergies.  Neurological: Negative for dizziness, tremors, facial asymmetry, weakness, light-headedness and headaches.  Hematological: Negative for adenopathy.  Psychiatric/Behavioral: Positive for dysphoric mood. The patient is nervous/anxious.        The patient states that she persistently feels overwhelmed and has an "I don't care" attitude.      Today's Vitals   12/20/18 1104  BP: (!) 146/90  Pulse: 77  Resp: 16  Temp: 97.8 F (36.6 C)  SpO2: 96%  Weight: 193 lb 3.2 oz (87.6 kg)  Height: 5\' 7"  (1.702 m)   Body mass index is 30.26 kg/m.  Physical Exam Vitals signs and nursing note reviewed.  Constitutional:      General: She  is not in acute distress.    Appearance: Normal appearance. She is well-developed. She is not diaphoretic.  HENT:     Head: Normocephalic and atraumatic.     Nose: Nose normal.     Mouth/Throat:     Pharynx: No oropharyngeal exudate.  Eyes:     Pupils: Pupils are equal, round, and reactive to light.  Neck:     Musculoskeletal: Normal range of motion and neck supple.     Thyroid: No thyromegaly.     Vascular: No carotid bruit or JVD.     Trachea: No tracheal deviation.  Cardiovascular:     Rate and Rhythm: Normal rate and regular rhythm.     Pulses: Normal pulses.          Dorsalis pedis pulses are 2+ on the right side and 2+ on the left side.       Posterior tibial pulses are 2+ on the right side and 2+ on the left side.     Heart sounds: Normal heart sounds. No murmur. No friction rub. No gallop.   Pulmonary:     Effort: Pulmonary effort is normal. No respiratory distress.     Breath sounds: Normal breath sounds. No wheezing or rales.  Chest:     Chest wall: No tenderness.     Breasts:        Right: Normal. No swelling, bleeding, inverted nipple, mass, nipple discharge, skin change or tenderness.        Left: Normal. No swelling, bleeding, inverted nipple, mass, nipple discharge, skin change or tenderness.  Abdominal:     General: Bowel sounds are normal.     Palpations: Abdomen is soft.     Tenderness: There is no abdominal tenderness.  Musculoskeletal: Normal range of motion.     Right foot: Normal range of motion. No deformity.     Left foot: Normal range of motion. No deformity.  Feet:     Right foot:     Protective Sensation: 10 sites tested. 10 sites sensed.     Skin integrity: Callus present.     Left foot:     Protective Sensation: 10 sites tested. 10 sites sensed.     Skin integrity: Callus present.     Comments: Callus formation on the balls of both feet. Skin intact with no evidence of lesion or  infection.  Lymphadenopathy:     Cervical: No cervical adenopathy.      Upper Body:     Right upper body: No axillary adenopathy.     Left upper body: No axillary adenopathy.  Skin:    General: Skin is warm and dry.  Neurological:     Mental Status: She is alert and oriented to person, place, and time. Mental status is at baseline.     Cranial Nerves: No cranial nerve deficit.  Psychiatric:        Mood and Affect: Mood normal.        Behavior: Behavior normal.        Thought Content: Thought content normal.        Judgment: Judgment normal.    Depression screen [facility] 2/9 12/20/2018 08/19/2018 03/26/2018 09/21/2017 06/11/2017  Decreased Interest 3 0 0 0 3  Down, Depressed, Hopeless 3 0 0 3 1  PHQ - 2 Score 6 0 0 3 4  Altered sleeping 3 - - 1 0  Tired, decreased energy 3 - - 3 3  Change in appetite 3 - - 3 3  Feeling bad or failure about yourself  0 - - 0 3  Trouble concentrating 3 - - 1 3  Moving slowly or fidgety/restless 0 - - 0 0  Suicidal thoughts 0 - - 0 0  PHQ-9 Score 18 - - 11 16  Difficult doing work/chores Somewhat difficult - - - Very difficult    Functional Status Survey: Is the patient deaf or have difficulty hearing?: No Does the patient have difficulty seeing, even when wearing glasses/contacts?: Yes Does the patient have difficulty concentrating, remembering, or making decisions?: No Does the patient have difficulty walking or climbing stairs?: No Does the patient have difficulty dressing or bathing?: No Does the patient have difficulty doing errands alone such as visiting a doctor's office or shopping?: No  MMSE - Mini Mental State Exam 06/11/2017  Orientation to time 5  Orientation to Place 5  Registration 3  Attention/ Calculation 5  Recall 3  Language- name 2 objects 2  Language- repeat 1  Language- follow 3 step command 3  Language- read & follow direction 1  Write a sentence 1  Copy design 1  Total score 30    Fall Risk  12/20/2018 08/19/2018 03/26/2018 12/29/2017 09/21/2017  Falls in the past year? 1 0 1 0 No  Number falls  in past yr: 1 - 1 - -  Injury with Fall? 0 - 1 - -      LABS: Recent Results (from the past 2160 hour(s))  Celiac Disease Panel     Status: None   Collection Time: 11/10/18 10:27 AM  Result Value Ref Range   Endomysial Ab, IgA Negative Negative   Tissue Transglutaminase Ab, IgA <2 0 - 3 U/mL    Comment: (NOTE)                              Negative        0 -  3                              Weak Positive   4 - 10                              Positive           >  10 Tissue Transglutaminase (tTG) has been identified as the endomysial antigen.  Studies have demonstr- ated that endomysial IgA antibodies have over 99% specificity for gluten sensitive enteropathy.    IgA 230 87 - 352 mg/dL    Comment: (NOTE) Performed At: [facility] [place], [place] [id number] Rush Farmer MD [id number]   SARS CORONAVIRUS 2 (TAT 6-24 HRS) Nasopharyngeal Nasopharyngeal Swab     Status: None   Collection Time: 11/16/18 11:32 AM   Specimen: Nasopharyngeal Swab  Result Value Ref Range   SARS Coronavirus 2 NEGATIVE NEGATIVE    Comment: (NOTE) SARS-CoV-2 target nucleic acids are NOT DETECTED. The SARS-CoV-2 RNA is generally detectable in upper and lower respiratory specimens during the acute phase of infection. Negative results do not preclude SARS-CoV-2 infection, do not rule out co-infections with other pathogens, and should not be used as the sole basis for treatment or other patient management decisions. Negative results must be combined with clinical observations, patient history, and epidemiological information. The expected result is Negative. Fact Sheet for Patients: SugarRoll.be Fact Sheet for Healthcare Providers: https://www.woods-mathews.com/ This test is not yet approved or cleared by the [place] FDA and  has been authorized for detection and/or diagnosis of SARS-CoV-2 by FDA under an Emergency Use  Authorization (EUA). This EUA will remain  in effect (meaning this test can be used) for the duration of the COVID-19 declaration under Section 56 4(b)(1) of the Act, 21 U.S.C. section 360bbb-3(b)(1), unless the authorization is terminated or revoked sooner. Performed at [facility], [place] [address], [place], [place] [address]   Glucose, capillary     Status: Abnormal   Collection Time: 11/19/18  7:38 AM  Result Value Ref Range   Glucose-Capillary 190 (H) 70 - 99 mg/dL  Glucose, capillary     Status: Abnormal   Collection Time: 11/19/18  9:12 AM  Result Value Ref Range   Glucose-Capillary 146 (H) 70 - 99 mg/dL  POCT HgB A1C     Status: Abnormal   Collection Time: 12/20/18 11:48 AM  Result Value Ref Range   Hemoglobin A1C 9.0 (A) 4.0 - 5.6 %   HbA1c POC (<> result, manual entry)     HbA1c, POC (prediabetic range)     HbA1c, POC (controlled diabetic range)     Assessment/Plan:  1. Encounter for general adult medical examination with abnormal findings Health maintenance exam today   2. Type 2 diabetes mellitus with hyperglycemia, with long-term current use of insulin (HCC) - POCT HgB A1C 9.0. patient admitting poor compliance with medication these past few weeks/months and difficulty with diet choices. No changes in diabetic medication. Encouraged improved adherence to medication dosing and schedule. Refer for diabetic eye exam.  - Ambulatory referral to Ophthalmology  3. Essential hypertension Stable. Continue bp medication as prescribed   4. Encounter for screening mammogram for malignant neoplasm of breast - MM DIGITAL SCREENING BILATERAL; Future  5. Inflammatory polyarthritis (HCC) Continue meloxicam daily as needed to reduce pain/inflammation. May take tramadol 50mg  up to three times daily if needed for more severe pain. Refills for both were provided today.  - meloxicam (MOBIC) 15 MG tablet; Take 1 tablet (15 mg total) by mouth daily.  Dispense: 30 tablet; Refill:  3 - traMADol (ULTRAM) 50 MG tablet; Take 1 tablet (50 mg total) by mouth 3 (three) times daily as needed for moderate pain.  Dispense: 90 tablet; Refill: 3  6. Flu vaccine need - Flu Vaccine MDCK QUAD PF  7.  Dysuria - UA/M w/rflx Culture, Routine   General Counseling: kaula hogsed understanding of the findings of todays visit and agrees with plan of treatment. I have discussed any further diagnostic evaluation that may be needed or ordered today. We also reviewed her medications today. she has been encouraged to call the office with any questions or concerns that should arise related to todays visit.    Counseling:  Diabetes Counseling:  1. Addition of ACE inh/ ARB'S for nephroprotection. Microalbumin is updated  2. Diabetic foot care, prevention of complications. Podiatry consult 3. Exercise and lose weight.  4. Diabetic eye examination, Diabetic eye exam is updated  5. Monitor blood sugar closlely. nutrition counseling.  6. Sign and symptoms of hypoglycemia including shaking sweating,confusion and headaches.  This patient was seen by Leretha Pol FNP Collaboration with Dr Lavera Guise as a part of collaborative care agreement  Orders Placed This Encounter  Procedures  . MM DIGITAL SCREENING BILATERAL  . Flu Vaccine MDCK QUAD PF  . UA/M w/rflx Culture, Routine  . Ambulatory referral to Ophthalmology  . POCT HgB A1C    Meds ordered this encounter  Medications  . meloxicam (MOBIC) 15 MG tablet    Sig: Take 1 tablet (15 mg total) by mouth daily.    Dispense:  30 tablet    Refill:  3    Order Specific Question:   Supervising Provider    Answer:   Lavera Guise [id number]  . traMADol (ULTRAM) 50 MG tablet    Sig: Take 1 tablet (50 mg total) by mouth 3 (three) times daily as needed for moderate pain.    Dispense:  90 tablet    Refill:  3    Confirmed with patient no allergy to tramadol    Order Specific Question:   Supervising Provider    Answer:   Lavera Guise [[id number]]     Time spent: [place], MD  Internal Medicine

## 2018-12-20 NOTE — Patient Instructions (Signed)
Diverticulosis  Diverticulosis is a condition that develops when small pouches (diverticula) form in the wall of the large intestine (colon). The colon is where water is absorbed and stool is formed. The pouches form when the inside layer of the colon pushes through weak spots in the outer layers of the colon. You may have a few pouches or many of them. What are the causes? The cause of this condition is not known. What increases the risk? The following factors may make you more likely to develop this condition:  Being older than age 57. Your risk for this condition increases with age. Diverticulosis is rare among people younger than age 57. By age 57, many people have it.  Eating a low-fiber diet.  Having frequent constipation.  Being overweight.  Not getting enough exercise.  Smoking.  Taking over-the-counter pain medicines, like aspirin and ibuprofen.  Having a family history of diverticulosis. What are the signs or symptoms? In most people, there are no symptoms of this condition. If you do have symptoms, they may include:  Bloating.  Cramps in the abdomen.  Constipation or diarrhea.  Pain in the lower left side of the abdomen. How is this diagnosed? This condition is most often diagnosed during an exam for other colon problems. Because diverticulosis usually has no symptoms, it often cannot be diagnosed independently. This condition may be diagnosed by:  Using a flexible scope to examine the colon (colonoscopy).  Taking an X-ray of the colon after dye has been put into the colon (barium enema).  Doing a CT scan. How is this treated? You may not need treatment for this condition if you have never developed an infection related to diverticulosis. If you have had an infection before, treatment may include:  Eating a high-fiber diet. This may include eating more fruits, vegetables, and grains.  Taking a fiber supplement.  Taking a live bacteria supplement (probiotic).   Taking medicine to relax your colon.  Taking antibiotic medicines. Follow these instructions at home:  Drink 6-8 glasses of water or more each day to prevent constipation.  Try not to strain when you have a bowel movement.  If you have had an infection before: ? Eat more fiber as directed by your health care provider or your diet and nutrition specialist (dietitian). ? Take a fiber supplement or probiotic, if your health care provider approves.  Take over-the-counter and prescription medicines only as told by your health care provider.  If you were prescribed an antibiotic, take it as told by your health care provider. Do not stop taking the antibiotic even if you start to feel better.  Keep all follow-up visits as told by your health care provider. This is important. Contact a health care provider if:  You have pain in your abdomen.  You have bloating.  You have cramps.  You have not had a bowel movement in 3 days. Get help right away if:  Your pain gets worse.  Your bloating becomes very bad.  You have a fever or chills, and your symptoms suddenly get worse.  You vomit.  You have bowel movements that are bloody or black.  You have bleeding from your rectum. Summary  Diverticulosis is a condition that develops when small pouches (diverticula) form in the wall of the large intestine (colon).  You may have a few pouches or many of them.  This condition is most often diagnosed during an exam for other colon problems.  If you have had an infection related to   diverticulosis, treatment may include increasing the fiber in your diet, taking supplements, or taking medicines. This information is not intended to replace advice given to you by your health care provider. Make sure you discuss any questions you have with your health care provider. Document Released: 11/01/2003 Document Revised: 01/16/2017 Document Reviewed: 12/24/2015 Elsevier Patient Education  2020  Elsevier Inc.  

## 2019-01-03 DIAGNOSIS — Z1231 Encounter for screening mammogram for malignant neoplasm of breast: Secondary | ICD-10-CM | POA: Insufficient documentation

## 2019-01-03 DIAGNOSIS — Z23 Encounter for immunization: Secondary | ICD-10-CM | POA: Insufficient documentation

## 2019-01-26 ENCOUNTER — Other Ambulatory Visit: Payer: Self-pay

## 2019-01-26 DIAGNOSIS — I10 Essential (primary) hypertension: Secondary | ICD-10-CM

## 2019-01-26 MED ORDER — HYDROCHLOROTHIAZIDE 12.5 MG PO TABS: ORAL_TABLET | ORAL | 5 refills | Status: DC

## 2019-02-03 ENCOUNTER — Emergency Department (HOSPITAL_COMMUNITY)
Admission: EM | Admit: 2019-02-03 | Discharge: 2019-02-03 | Disposition: A | Discharge status: Home / Self Care | Payer: Medicare Other | Attending: Emergency Medicine | Admitting: Emergency Medicine

## 2019-02-03 ENCOUNTER — Other Ambulatory Visit: Payer: Self-pay

## 2019-02-03 ENCOUNTER — Emergency Department (HOSPITAL_COMMUNITY): Payer: Medicare Other

## 2019-02-03 ENCOUNTER — Telehealth: Payer: Self-pay

## 2019-02-03 ENCOUNTER — Encounter (HOSPITAL_COMMUNITY): Payer: Self-pay | Admitting: Emergency Medicine

## 2019-02-03 DIAGNOSIS — G510 Bell's palsy: Secondary | ICD-10-CM

## 2019-02-03 DIAGNOSIS — H538 Other visual disturbances: Secondary | ICD-10-CM | POA: Diagnosis not present

## 2019-02-03 DIAGNOSIS — I129 Hypertensive chronic kidney disease with stage 1 through stage 4 chronic kidney disease, or unspecified chronic kidney disease: Secondary | ICD-10-CM | POA: Diagnosis not present

## 2019-02-03 DIAGNOSIS — F17210 Nicotine dependence, cigarettes, uncomplicated: Secondary | ICD-10-CM | POA: Insufficient documentation

## 2019-02-03 DIAGNOSIS — Z91040 Latex allergy status: Secondary | ICD-10-CM | POA: Diagnosis not present

## 2019-02-03 DIAGNOSIS — E1122 Type 2 diabetes mellitus with diabetic chronic kidney disease: Secondary | ICD-10-CM | POA: Insufficient documentation

## 2019-02-03 DIAGNOSIS — Z8673 Personal history of transient ischemic attack (TIA), and cerebral infarction without residual deficits: Secondary | ICD-10-CM | POA: Insufficient documentation

## 2019-02-03 DIAGNOSIS — E039 Hypothyroidism, unspecified: Secondary | ICD-10-CM | POA: Diagnosis not present

## 2019-02-03 DIAGNOSIS — R1310 Dysphagia, unspecified: Secondary | ICD-10-CM | POA: Insufficient documentation

## 2019-02-03 DIAGNOSIS — Z79899 Other long term (current) drug therapy: Secondary | ICD-10-CM | POA: Diagnosis not present

## 2019-02-03 DIAGNOSIS — N189 Chronic kidney disease, unspecified: Secondary | ICD-10-CM | POA: Diagnosis not present

## 2019-02-03 DIAGNOSIS — Z794 Long term (current) use of insulin: Secondary | ICD-10-CM | POA: Insufficient documentation

## 2019-02-03 DIAGNOSIS — R29810 Facial weakness: Secondary | ICD-10-CM | POA: Diagnosis present

## 2019-02-03 LAB — I-STAT BETA HCG BLOOD, ED (MC, WL, AP ONLY): I-stat hCG, quantitative: <5 m[IU]/mL (ref <5)

## 2019-02-03 LAB — I-STAT CHEM 8, ED
BUN: 20 mg/dL (ref 6–20)
Calcium, Ion: 1.1 mmol/L — ABNORMAL LOW (ref 1.15–1.40)
Chloride: 96 mmol/L — ABNORMAL LOW (ref 98–111)
Creatinine, Ser: 0.5 mg/dL (ref 0.44–1.00)
Glucose, Bld: 289 mg/dL — ABNORMAL HIGH (ref 70–99)
HCT: 48 % — ABNORMAL HIGH (ref 36.0–46.0)
Hemoglobin: 16.3 g/dL — ABNORMAL HIGH (ref 12.0–15.0)
Potassium: 4.2 mmol/L (ref 3.5–5.1)
Sodium: 133 mmol/L — ABNORMAL LOW (ref 135–145)
TCO2: 28 mmol/L (ref 22–32)

## 2019-02-03 LAB — COMPREHENSIVE METABOLIC PANEL
ALT: 30 U/L (ref 0–44)
AST: 41 U/L (ref 15–41)
Albumin: 4 g/dL (ref 3.5–5.0)
Alkaline Phosphatase: 75 U/L (ref 38–126)
Anion gap: 13 (ref 5–15)
BUN: 17 mg/dL (ref 6–20)
CO2: 26 mmol/L (ref 22–32)
Calcium: 9.4 mg/dL (ref 8.9–10.3)
Chloride: 95 mmol/L — ABNORMAL LOW (ref 98–111)
Creatinine, Ser: 0.6 mg/dL (ref 0.44–1.00)
GFR calc Af Amer: >60 mL/min (ref >60)
GFR calc non Af Amer: >60 mL/min (ref >60)
Glucose, Bld: 285 mg/dL — ABNORMAL HIGH (ref 70–99)
Potassium: 4.5 mmol/L (ref 3.5–5.1)
Sodium: 134 mmol/L — ABNORMAL LOW (ref 135–145)
Total Bilirubin: 1.1 mg/dL (ref 0.3–1.2)
Total Protein: 7.7 g/dL (ref 6.5–8.1)

## 2019-02-03 LAB — DIFFERENTIAL
Abs Immature Granulocytes: 0.03 10*3/uL (ref 0.00–0.07)
Basophils Absolute: 0.1 10*3/uL (ref 0.0–0.1)
Basophils Relative: 1 %
Eosinophils Absolute: 0.5 10*3/uL (ref 0.0–0.5)
Eosinophils Relative: 5 %
Immature Granulocytes: 0 %
Lymphocytes Relative: 36 %
Lymphs Abs: 3.4 10*3/uL (ref 0.7–4.0)
Monocytes Absolute: 0.7 10*3/uL (ref 0.1–1.0)
Monocytes Relative: 7 %
Neutro Abs: 4.8 10*3/uL (ref 1.7–7.7)
Neutrophils Relative %: 51 %

## 2019-02-03 LAB — APTT: aPTT: 29 seconds (ref 24–36)

## 2019-02-03 LAB — CBC
HCT: 46 % (ref 36.0–46.0)
Hemoglobin: 15.9 g/dL — ABNORMAL HIGH (ref 12.0–15.0)
MCH: 29.7 pg (ref 26.0–34.0)
MCHC: 34.6 g/dL (ref 30.0–36.0)
MCV: 86 fL (ref 80.0–100.0)
Platelets: 299 10*3/uL (ref 150–400)
RBC: 5.35 MIL/uL — ABNORMAL HIGH (ref 3.87–5.11)
RDW: 12.3 % (ref 11.5–15.5)
WBC: 9.5 10*3/uL (ref 4.0–10.5)
nRBC: 0 % (ref 0.0–0.2)

## 2019-02-03 LAB — CBG MONITORING, ED: Glucose-Capillary: 238 mg/dL — ABNORMAL HIGH (ref 70–99)

## 2019-02-03 LAB — PROTIME-INR
INR: 1 (ref 0.8–1.2)
Prothrombin Time: 13.1 seconds (ref 11.4–15.2)

## 2019-02-03 MED ORDER — PREDNISONE 20 MG PO TABS: 20.0000 mg | ORAL_TABLET | Freq: Two times a day (BID) | ORAL | 0 refills | Status: DC

## 2019-02-03 MED ORDER — SODIUM CHLORIDE 0.9% FLUSH: 3.0000 mL | Freq: Once | INTRAVENOUS | Status: DC

## 2019-02-03 NOTE — ED Notes (Signed)
Pt to MRI

## 2019-02-03 NOTE — Discharge Instructions (Signed)
The MRI imaging did not indicate a stroke.  Therefore your symptoms are most likely related to Bell's palsy.  We are prescribing prednisone to help treat for suspected inflammation of the right facial nerve.  This should help the Bell's palsy improve.  To prevent injury to your eye, use moisturizing eyedrops, like Lacri-Lube, every 1-2 hours during the day.  Also at nighttime tape your right eye shut to prevent it from being injured.  Return here, if needed, for problems.

## 2019-02-03 NOTE — ED Notes (Signed)
Pt CBG 238,

## 2019-02-03 NOTE — ED Triage Notes (Addendum)
Stroke symptom started 2 days ago-- right sided facial droop- hx of stroke in past. Unable to close right eye-- no hx of bells palsey--

## 2019-02-03 NOTE — ED Notes (Signed)
Patient verbalizes understanding of discharge instructions. Opportunity for questioning and answers were provided. Armband removed by staff, pt discharged from ED with family.  

## 2019-02-03 NOTE — ED Provider Notes (Signed)
[facility] EMERGENCY DEPARTMENT Provider Note   CSN: [id number] Arrival date & time: 02/03/19  1108     History Chief Complaint  Patient presents with  . Stroke Symptoms    Morgan Daniels is a 58 y.o. female.  HPI   She is here for evaluation of droopiness of her right face present for 2 days associated with difficulty eating, on the right side, and vision that comes and goes, in both eyes.  She feels at times like "a shade is coming down."  There are other times when her vision is completely normal.  She has also seen some floating in her right eye and complains of right eye pain.  She denies fever, chills, cough, shortness of breath or difficulty walking.  No recent illnesses.  No known sick contacts.  There are no other known modifying factors.  Past Medical History:  Diagnosis Date  . Anxiety   . Arthritis   . Carotid artery occlusion   . Chronic kidney disease May 2017   UTI  . Depression   . Diabetes ([place])   . Diverticulosis   . Fatty liver   . Fibromyalgia   . GERD (gastroesophageal reflux disease)   . H/O hiatal hernia   . Hypertension   . IBS (irritable bowel syndrome)   . Peptic ulcer   . Plantar fasciitis   . Stroke [facility]) Dec. 14,2013   Right side-ministroke    Patient Active Problem List   Diagnosis Date Noted  . Encounter for screening mammogram for malignant neoplasm of breast 01/03/2019  . Flu vaccine need 01/03/2019  . Noninfectious diarrhea   . Polyp of sigmoid colon   . Generalized abdominal pain 08/22/2018  . Irritable bowel syndrome with diarrhea 12/30/2017  . Uncontrolled type 2 diabetes mellitus with hyperglycemia ([place]) 09/29/2017  . 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 Depression, major, single episode, moderate ([place]) 09/29/2017  . Encounter for general adult medical examination with abnormal findings 06/11/2017  . Type 2 diabetes mellitus with hyperglycemia, with long-term current use of insulin ([place])  06/11/2017  . Vitamin D deficiency 06/11/2017  . Mixed hyperlipidemia 06/11/2017  . Acquired hypothyroidism 06/11/2017  . Dysuria 06/11/2017  . Essential hypertension 03/02/2017  . Impingement syndrome of shoulder region 01/24/2016  . Carotid stenosis 08/09/2013  . Carotid artery stenosis with cerebral infarction ([place]) 08/03/2013  . Aftercare following surgery of the circulatory system, [place] 12/08/2012  . Tenderness-Left neck 12/08/2012  . Pain in neck 05/12/2012  . Scalp pain 05/12/2012  . Occlusion and stenosis of carotid artery without mention of cerebral infarction 04/28/2012  . HYPERCHOLESTEROLEMIA 06/01/2006  . PANIC ATTACK 06/01/2006  . TOBACCO ABUSE 06/01/2006  . DEPRESSION 06/01/2006  . ALLERGIC RHINITIS 06/01/2006  . GERD 06/01/2006  . DIVERTICULAR DISEASE 06/01/2006  . MIGRAINES, HX OF 06/01/2006    Past Surgical History:  Procedure Laterality Date  . ABDOMINAL HYSTERECTOMY  1990  . BACK SURGERY  2000, 2004  . CAROTID ENDARTERECTOMY Left 05/06/12  . CARPAL TUNNEL RELEASE Left 07/18/2015   Procedure: CARPAL TUNNEL RELEASE;  Surgeon: Earnestine Leys, MD;  Location: [facility] ORS;  Service: Orthopedics;  Laterality: Left;  . CEREBRAL ANGIOGRAM Bilateral 05/03/2012   Procedure: CEREBRAL ANGIOGRAM;  Surgeon: Angelia Mould, MD;  Location: [facility] CATH LAB;  Service: Cardiovascular;  Laterality: Bilateral;  . CHOLECYSTECTOMY  2001  . COLONOSCOPY WITH PROPOFOL N/A 11/19/2018   Procedure: COLONOSCOPY WITH PROPOFOL;  Surgeon: Lucilla Lame, MD;  Location: [facility];  Service: Endoscopy;  Laterality: N/A;  Diabetic - insulin  . ENDARTERECTOMY Left 05/06/2012   Procedure: ENDARTERECTOMY CAROTID;  Surgeon: Angelia Mould, MD;  Location: [facility];  Service: Vascular;  Laterality: Left;  . ENDARTERECTOMY Right 08/09/2013   Procedure: ENDARTERECTOMY CAROTID-RIGHT;  Surgeon: Angelia Mould, MD;  Location: [facility];  Service: Vascular;  Laterality: Right;  . HERNIA REPAIR    .  PATCH ANGIOPLASTY Left 05/06/2012   Procedure: WITH DACRON PATCH ANGIOPLASTY ;  Surgeon: Angelia Mould, MD;  Location: [facility];  Service: Vascular;  Laterality: Left;  . POLYPECTOMY  11/19/2018   Procedure: POLYPECTOMY;  Surgeon: Lucilla Lame, MD;  Location: [facility];  Service: Endoscopy;;  . [place]  2004  . TONSILLECTOMY    . TUBAL LIGATION       OB History   No obstetric history on file.     Family History  Problem Relation Age of Onset  . Hypertension Mother   . Hyperlipidemia Mother   . Deep vein thrombosis Mother   . Cancer Father   . Alcohol abuse Father   . Hypertension Maternal Grandmother   . Deep vein thrombosis Sister   . Alcohol abuse Sister   . Alcohol abuse Sister     Social History   Tobacco Use  . Smoking status: Current Every Day Smoker    Packs/day: 1.00    Years: 40.00    Pack years: 40.00    Types: Cigarettes    Start date: 11/09/1970  . Smokeless tobacco: Never Used  . Tobacco comment:    Substance Use Topics  . Alcohol use: No    Alcohol/week: 0.0 standard drinks    Comment:   quit 2 years   . Drug use: No    Home Medications Prior to Admission medications   Medication Sig Start Date End Date Taking? Authorizing Provider  cetirizine (ZYRTEC) 10 MG tablet Take 10 mg by mouth daily.    [provider]  Cholecalciferol (VITAMIN D-3) 5000 UNITS TABS Take 5,000 Units by mouth daily.     [provider]  cholestyramine (QUESTRAN) 4 g packet Take 1 packet (4 g total) by mouth 4 (four) times daily. 11/10/18   Lucilla Lame, MD  Continuous Blood Gluc Sensor (FREESTYLE LIBRE 14 DAY SENSOR) MISC USE AS DIRECTED 01/01/18   Ronnell Freshwater, NP  dicyclomine (BENTYL) 20 MG tablet Take 1 tablet (20 mg total) by mouth 4 (four) times daily -  before meals and at bedtime. 08/19/18   Ronnell Freshwater, NP  FLUoxetine (PROZAC) 40 MG capsule Take 2 capsules (80 mg total) by mouth daily. 10/26/18   Ronnell Freshwater, NP    gabapentin (NEURONTIN) 600 MG tablet TAKE 1 TABLET BY MOUTH THREE TIMES A DAY. 08/19/18   Boscia, Heather E, NP  hydrochlorothiazide (HYDRODIURIL) 12.5 MG tablet TAKE 1 TABLET BY MOUTH DAILY FOR BLOOD PRESSURE/EDEMA 01/26/19   Ronnell Freshwater, NP  insulin aspart (NOVOLOG) 100 UNIT/ML injection Sliding scale insulin BID prior to meals. Instructions provided.not to exceed 45 units daily. E11.65 08/19/18   Ronnell Freshwater, NP  insulin degludec (TRESIBA FLEXTOUCH) 100 UNIT/ML SOPN FlexTouch Pen Inject 0.6 mLs (60 Units total) into the skin daily. 08/19/18   Ronnell Freshwater, NP  Insulin Pen Needle (NOVOFINE) 32G X 6 MM MISC Use as directed with Victoza  Dx code E11.65 11/18/18   Ronnell Freshwater, NP  meloxicam (MOBIC) 15 MG tablet Take 1 tablet (15 mg total) by mouth daily. 12/20/18  Ronnell Freshwater, NP  metoprolol succinate (TOPROL-XL) 50 MG 24 hr tablet Take 1 tablet (50 mg total) by mouth daily. Take with or immediately following a meal. 08/19/18   Boscia, Greer Ee, NP  omeprazole (PRILOSEC) 20 MG capsule Take 20 mg by mouth daily as needed.    [provider]  ondansetron (ZOFRAN ODT) 8 MG disintegrating tablet Take 1 tablet (8 mg total) by mouth every 8 (eight) hours as needed for nausea. 08/14/18   Arta Silence, MD  ONETOUCH DELICA LANCETS FINE MISC CHECK BLOOD SUGAR THREE TIMES A DAY 10/09/14   [provider]  [facility] VERIO test strip CHECK BLOOD SUGAR THREE TIMES A DAY 10/09/14   [provider]  polyethylene glycol (GOLYTELY) 236 g solution Drink one 8 oz glass every 20 mins until entire container is finished starting at 5:00pm on 11/18/18 11/10/18   Lucilla Lame, MD  predniSONE (DELTASONE) 20 MG tablet Take 1 tablet (20 mg total) by mouth 2 (two) times daily. 02/03/19   Daleen Bo, MD  Semaglutide,0.25 or 0.5MG /DOS, (OZEMPIC, 0.25 OR 0.5 MG/DOSE,) 2 MG/1.5ML SOPN Inject 0.5 mg into the skin once a week. 08/19/18   Ronnell Freshwater, NP  tiZANidine (ZANAFLEX) 4 MG  tablet TAKE 1 TABLET(S) BY MOUTH TWICE A DAY AS NEEDED FOR MUSCLE PAIN AND SPASMS 11/18/18   Ronnell Freshwater, NP  traMADol (ULTRAM) 50 MG tablet Take 1 tablet (50 mg total) by mouth 3 (three) times daily as needed for moderate pain. 12/20/18   Ronnell Freshwater, NP    Allergies    Simvastatin, Morphine and related, Milk-related compounds, and Latex  Review of Systems   Review of Systems  All other systems reviewed and are negative.   Physical Exam Updated Vital Signs BP (!) 153/75 (BP Location: Right Arm)   Pulse 73   Temp 98.1 F (36.7 C) (Oral)   Resp 20   SpO2 99%   Physical Exam Vitals and nursing note reviewed.  Constitutional:      General: She is not in acute distress.    Appearance: She is well-developed. She is not ill-appearing, toxic-appearing or diaphoretic.  HENT:     Head: Normocephalic and atraumatic.     Right Ear: Tympanic membrane, ear canal and external ear normal.     Nose: No congestion or rhinorrhea.  Eyes:     Conjunctiva/sclera: Conjunctivae normal.     Pupils: Pupils are equal, round, and reactive to light.  Neck:     Trachea: Phonation normal.  Cardiovascular:     Rate and Rhythm: Normal rate and regular rhythm.  Pulmonary:     Effort: Pulmonary effort is normal.     Breath sounds: Normal breath sounds.  Chest:     Chest wall: No tenderness.  Abdominal:     General: There is no distension.     Palpations: Abdomen is soft.     Tenderness: There is no abdominal tenderness. There is no guarding.  Musculoskeletal:        General: Normal range of motion.     Cervical back: Normal range of motion and neck supple.  Skin:    General: Skin is warm and dry.  Neurological:     Mental Status: She is alert and oriented to person, place, and time.     Motor: No abnormal muscle tone.     Comments: No dysarthria or aphasia.  Right facial droop is present.  No visual field cut.  No pronator drift.  No  ataxia.  Psychiatric:        Mood and Affect: Mood  normal.        Behavior: Behavior normal.        Thought Content: Thought content normal.        Judgment: Judgment normal.     ED Results / Procedures / Treatments   Labs (all labs ordered are listed, but only abnormal results are displayed) Labs Reviewed  CBC - Abnormal; Notable for the following components:      Result Value   RBC 5.35 (*)    Hemoglobin 15.9 (*)    All other components within normal limits  COMPREHENSIVE METABOLIC PANEL - Abnormal; Notable for the following components:   Sodium 134 (*)    Chloride 95 (*)    Glucose, Bld 285 (*)    All other components within normal limits  I-STAT CHEM 8, ED - Abnormal; Notable for the following components:   Sodium 133 (*)    Chloride 96 (*)    Glucose, Bld 289 (*)    Calcium, Ion 1.10 (*)    Hemoglobin 16.3 (*)    HCT 48.0 (*)    All other components within normal limits  CBG MONITORING, ED - Abnormal; Notable for the following components:   Glucose-Capillary 238 (*)    All other components within normal limits  PROTIME-INR  APTT  DIFFERENTIAL  I-STAT BETA HCG BLOOD, ED ([facility], [facility], [facility] ONLY)    EKG EKG Interpretation  Date/Time:  Thursday February 03 2019 13:09:50 EST Ventricular Rate:  68 PR Interval:  170 QRS Duration: 92 QT Interval:  410 QTC Calculation: 435 R Axis:   6 Text Interpretation: Normal sinus rhythm Normal ECG since last tracing no significant change Confirmed by Daleen Bo [id number]) on 02/03/2019 1:43:08 PM   Radiology CT HEAD WO CONTRAST  Result Date: 02/03/2019 CLINICAL DATA:  Right-sided facial droop, dizziness EXAM: CT HEAD WITHOUT CONTRAST TECHNIQUE: Contiguous axial images were obtained from the base of the skull through the vertex without intravenous contrast. COMPARISON:  07/29/2013 FINDINGS: Brain: No evidence of acute infarction, hemorrhage, hydrocephalus, extra-axial collection or mass lesion/mass effect. Prominent low-density changes within the periventricular and subcortical white  matter, particularly within the right centrum semiovale and corona radiata, compatible with known prior infarcts and chronic microvascular ischemic change. Mild diffuse cerebral volume loss. Vascular: Scattered intracranial atherosclerosis. No definite hyperdense vessel. Skull: Normal. Negative for fracture or focal lesion. Sinuses/Orbits: No acute finding. Other: None. IMPRESSION: 1. No acute intracranial abnormality by noncontrast CT. Further evaluation can be performed with MRI, as clinically indicated. 2. Chronic microvascular ischemic changes and mild cerebral volume loss. Electronically Signed   By: Davina Poke M.D.   On: 02/03/2019 11:58   MR BRAIN WO CONTRAST  Result Date: 02/03/2019 CLINICAL DATA:  Left-sided facial numbness and facial droop. EXAM: MRI HEAD WITHOUT CONTRAST TECHNIQUE: Multiplanar, multiecho pulse sequences of the brain and surrounding structures were obtained without intravenous contrast. COMPARISON:  Head CT same day.  MRI 07/29/2013 FINDINGS: Brain: Diffusion imaging does not show any acute or subacute infarction. No focal brainstem finding. Few old tiny small vessel cerebellar infarctions. Cerebral hemispheres show chronic small-vessel ischemic change of the deep and subcortical white matter. Old right parietal cortical infarction. No mass lesion, hemorrhage, hydrocephalus or extra-axial collection. Findings are progressive since 2015. Vascular: Major vessels at the base of the brain show flow. Skull and upper cervical spine: Negative Sinuses/Orbits: Clear/normal Other: None IMPRESSION: No acute or subacute finding. Extensive chronic ischemic  changes throughout the brain as outlined above, including an old right parietal cortical infarction. Findings are progressive since the study of 2015. Electronically Signed   By: Nelson Chimes M.D.   On: 02/03/2019 14:42    Procedures Procedures (including critical care time)  Medications Ordered in ED Medications  sodium chloride  flush (NS) 0.9 % injection 3 mL (has no administration in time range)    ED Course  I have reviewed the triage vital signs and the nursing notes.  Pertinent labs & imaging results that were available during my care of the patient were reviewed by me and considered in my medical decision making (see chart for details).  Clinical Course as of Feb 03 1607  Thu Feb 03, 2019  1554 Normal except hemoglobin elevated  CBC(!) [EW]  1554 Normal except sodium low, chloride low, glucose high, calcium low, hemoglobin high  I-stat chem 8, ED(!) [EW]  1554 Normal  I-Stat beta hCG blood, ED [EW]  1554 Normal except sodium low, chloride low, glucose high  Comprehensive metabolic panel(!) [EW]  99991111 No infarct per radiology  CT HEAD WO CONTRAST [EW]  1555 No acute CVA.  MR BRAIN WO CONTRAST [EW]    Clinical Course User Index [EW] Daleen Bo, MD   MDM Rules/Calculators/A&P                       Patient Vitals for the past 24 hrs:  BP Temp Temp src Pulse Resp SpO2  02/03/19 1513 (!) 153/75 -- -- 73 20 99 %  02/03/19 1115 (!) 149/80 98.1 F (36.7 C) Oral 76 18 100 %    4:03 PM Reevaluation with update and discussion. After initial assessment and treatment, an updated evaluation reveals no change in clinical status, findings cussed with the patient all questions were answered. Daleen Bo   Medical Decision Making: Right facial weakness and difficulty eating.  Nonspecific visual symptoms.  Clinically patient most likely has Bell's palsy however vision problems MRI imaging was obtained.  I suspect that her vision abnormality is related to her difficulty closing her right eye and being somewhat dry.  No persistent vision abnormality.  No apparent central stroke.  No indication for further ED evaluation.  CRITICAL CARE-no Performed by: Daleen Bo    Nursing Notes Reviewed/ Care Coordinated Applicable Imaging Reviewed Interpretation of Laboratory Data incorporated into ED  treatment  The patient appears reasonably screened and/or stabilized for discharge and I doubt any other medical condition or other [facility] requiring further screening, evaluation, or treatment in the ED at this time prior to discharge.  Plan: Home Medications-continue usual; Home Treatments-eye care to prevent injury; return here if the recommended treatment, does not improve the symptoms; Recommended follow up-neurology follow-up 1 week and as needed    Final Clinical Impression(s) / ED Diagnoses Final diagnoses:  Bell's palsy    Rx / DC Orders ED Discharge Orders         Ordered    predniSONE (DELTASONE) 20 MG tablet  2 times daily     02/03/19 1608           Daleen Bo, MD 02/03/19 [phone]

## 2019-02-03 NOTE — Telephone Encounter (Signed)
Pt called and said that she would like an appt because she was worried about maybe having mini strokes.   She went on to say that she was having facial drooping and that she was slurring her speech. Advised pt that these were signs of a stroke and she needed to go to the ER immediately. She said that she really didn't want to go as her sisters husband passed away and she needed to be there with her. Advised pt that she needed to take care of herself first so she could be there to help her sister.   Pt agreed to go to [place], [place]

## 2019-02-23 ENCOUNTER — Other Ambulatory Visit: Payer: Self-pay

## 2019-02-23 DIAGNOSIS — K580 Irritable bowel syndrome with diarrhea: Secondary | ICD-10-CM

## 2019-02-23 DIAGNOSIS — F321 Major depressive disorder, single episode, moderate: Secondary | ICD-10-CM

## 2019-02-23 MED ORDER — FLUOXETINE HCL 40 MG PO CAPS: 80.0000 mg | ORAL_CAPSULE | Freq: Every day | ORAL | 1 refills | Status: DC

## 2019-02-23 MED ORDER — DICYCLOMINE HCL 20 MG PO TABS: 20.0000 mg | ORAL_TABLET | Freq: Three times a day (TID) | ORAL | 2 refills | Status: DC

## 2019-03-18 ENCOUNTER — Ambulatory Visit: Payer: Medicare Other | Admitting: Adult Health

## 2019-03-24 ENCOUNTER — Ambulatory Visit: Payer: Medicare Other | Admitting: Nurse Practitioner

## 2019-03-28 ENCOUNTER — Ambulatory Visit: Payer: Medicare Other | Admitting: Nurse Practitioner

## 2019-03-28 ENCOUNTER — Encounter: Payer: Self-pay | Admitting: Nurse Practitioner

## 2019-03-28 VITALS — Ht 63.0 in | Wt 193.0 lb

## 2019-03-28 DIAGNOSIS — E1165 Type 2 diabetes mellitus with hyperglycemia: Secondary | ICD-10-CM

## 2019-03-28 DIAGNOSIS — I10 Essential (primary) hypertension: Secondary | ICD-10-CM

## 2019-03-28 DIAGNOSIS — Z794 Long term (current) use of insulin: Secondary | ICD-10-CM | POA: Diagnosis not present

## 2019-03-28 DIAGNOSIS — R112 Nausea with vomiting, unspecified: Secondary | ICD-10-CM | POA: Diagnosis not present

## 2019-03-28 DIAGNOSIS — R1110 Vomiting, unspecified: Secondary | ICD-10-CM | POA: Insufficient documentation

## 2019-03-28 DIAGNOSIS — M064 Inflammatory polyarthropathy: Secondary | ICD-10-CM | POA: Diagnosis not present

## 2019-03-28 DIAGNOSIS — K580 Irritable bowel syndrome with diarrhea: Secondary | ICD-10-CM | POA: Diagnosis not present

## 2019-03-28 MED ORDER — TRAMADOL HCL 50 MG PO TABS: 50.0000 mg | ORAL_TABLET | Freq: Three times a day (TID) | ORAL | 3 refills | Status: DC | PRN

## 2019-03-28 MED ORDER — ONDANSETRON 8 MG PO TBDP: 8.0000 mg | ORAL_TABLET | Freq: Three times a day (TID) | ORAL | 3 refills | Status: DC | PRN

## 2019-03-28 NOTE — Progress Notes (Signed)
[facility] [place], [place] [address]  Internal MEDICINE  Telephone Visit  Patient Name: Morgan Daniels  [id number]  [id number]  Date of Service: 03/28/2019  I connected with the patient at 2:55pm  by webcam and verified the patients identity using two identifiers.   I discussed the limitations, risks, security and privacy concerns of performing an evaluation and management service by webcam and the availability of in person appointments. I also discussed with the patient that there may be a patient responsible charge related to the service.  The patient expressed understanding and agrees to proceed.    Chief Complaint  Patient presents with  . Telephone Assessment  . Telephone Screen  . Follow-up  . Gastroesophageal Reflux  . Diabetes    GLUCOSE 184  . Diarrhea  . Quality Metric Gaps    PNEUMONIA    The patient has been contacted via webcam for follow up visit due to concerns for spread of novel coronavirus. Patient presents for routine visit. The patient states that for the past four days she has had a lot of bowel spasms and diarrhea. She is unsure if this is related to diabetes or IBS.  States that she stopped taking the ozempic for a little while. She had gotten some bad stomach pain. It was also very expensive. She restarted this about a month ago. pharmacy filled a three months supply for much less than it was prior to the new year. She is unsure if the recent symptoms are related to her IBS or the ozempic. She did have colonoscopy in 12/2018 and they removed two benign polyps from the large intestine and not other problems were found. She does have prescription for questran, which she was taking three to four times daily with food, however, when symptoms got a bit better, she stopped taking it and has not taken it in some time       Current Medication: Outpatient Encounter Medications as of 03/28/2019  Medication Sig Note  . cetirizine (ZYRTEC) 10 MG  tablet Take 10 mg by mouth daily.   . Cholecalciferol (VITAMIN D-3) 5000 UNITS TABS Take 5,000 Units by mouth daily.    . cholestyramine (QUESTRAN) 4 g packet Take 1 packet (4 g total) by mouth 4 (four) times daily.   . Continuous Blood Gluc Sensor (FREESTYLE LIBRE 14 DAY SENSOR) MISC USE AS DIRECTED   . dicyclomine (BENTYL) 20 MG tablet Take 1 tablet (20 mg total) by mouth 4 (four) times daily -  before meals and at bedtime.   [name] FLUoxetine (PROZAC) 40 MG capsule Take 2 capsules (80 mg total) by mouth daily.   [name] gabapentin (NEURONTIN) 600 MG tablet TAKE 1 TABLET BY MOUTH THREE TIMES A DAY.   . hydrochlorothiazide (HYDRODIURIL) 12.5 MG tablet TAKE 1 TABLET BY MOUTH DAILY FOR BLOOD PRESSURE/EDEMA   . insulin aspart (NOVOLOG) 100 UNIT/ML injection Sliding scale insulin BID prior to meals. Instructions provided.not to exceed 45 units daily. E11.65   . insulin degludec (TRESIBA FLEXTOUCH) 100 UNIT/ML SOPN FlexTouch Pen Inject 0.6 mLs (60 Units total) into the skin daily.   . Insulin Pen Needle (NOVOFINE) 32G X 6 MM MISC Use as directed with Victoza  Dx code E11.65   . meloxicam (MOBIC) 15 MG tablet Take 1 tablet (15 mg total) by mouth daily.   . metoprolol succinate (TOPROL-XL) 50 MG 24 hr tablet Take 1 tablet (50 mg total) by mouth daily. Take with or immediately following a meal.   .  omeprazole (PRILOSEC) 20 MG capsule Take 20 mg by mouth daily as needed.   . ondansetron (ZOFRAN ODT) 8 MG disintegrating tablet Take 1 tablet (8 mg total) by mouth every 8 (eight) hours as needed for nausea.   Glory Rosebush DELICA LANCETS FINE MISC CHECK BLOOD SUGAR THREE TIMES A DAY 11/09/2014: Received from: External Pharmacy  . ONETOUCH VERIO test strip CHECK BLOOD SUGAR THREE TIMES A DAY 11/09/2014: Received from: External Pharmacy  . polyethylene glycol (GOLYTELY) 236 g solution Drink one 8 oz glass every 20 mins until entire container is finished starting at 5:00pm on 11/18/18   . predniSONE (DELTASONE) 20 MG tablet Take  1 tablet (20 mg total) by mouth 2 (two) times daily.   . Semaglutide,0.25 or 0.5MG /DOS, (OZEMPIC, 0.25 OR 0.5 MG/DOSE,) 2 MG/1.5ML SOPN Inject 0.5 mg into the skin once a week.   [name] tiZANidine (ZANAFLEX) 4 MG tablet TAKE 1 TABLET(S) BY MOUTH TWICE A DAY AS NEEDED FOR MUSCLE PAIN AND SPASMS   . traMADol (ULTRAM) 50 MG tablet Take 1 tablet (50 mg total) by mouth 3 (three) times daily as needed for moderate pain.   . [DISCONTINUED] ondansetron (ZOFRAN ODT) 8 MG disintegrating tablet Take 1 tablet (8 mg total) by mouth every 8 (eight) hours as needed for nausea.   . [DISCONTINUED] traMADol (ULTRAM) 50 MG tablet Take 1 tablet (50 mg total) by mouth 3 (three) times daily as needed for moderate pain.    No facility-administered encounter medications on file as of 03/28/2019.    Surgical History: Past Surgical History:  Procedure Laterality Date  . ABDOMINAL HYSTERECTOMY  1990  . BACK SURGERY  2000, 2004  . CAROTID ENDARTERECTOMY Left 05/06/12  . CARPAL TUNNEL RELEASE Left 07/18/2015   Procedure: CARPAL TUNNEL RELEASE;  Surgeon: Earnestine Leys, MD;  Location: [facility] ORS;  Service: Orthopedics;  Laterality: Left;  . CEREBRAL ANGIOGRAM Bilateral 05/03/2012   Procedure: CEREBRAL ANGIOGRAM;  Surgeon: Angelia Mould, MD;  Location: [facility] CATH LAB;  Service: Cardiovascular;  Laterality: Bilateral;  . CHOLECYSTECTOMY  2001  . COLONOSCOPY WITH PROPOFOL N/A 11/19/2018   Procedure: COLONOSCOPY WITH PROPOFOL;  Surgeon: Lucilla Lame, MD;  Location: [place];  Service: Endoscopy;  Laterality: N/A;  Diabetic - insulin  . ENDARTERECTOMY Left 05/06/2012   Procedure: ENDARTERECTOMY CAROTID;  Surgeon: Angelia Mould, MD;  Location: [place];  Service: Vascular;  Laterality: Left;  . ENDARTERECTOMY Right 08/09/2013   Procedure: ENDARTERECTOMY CAROTID-RIGHT;  Surgeon: Angelia Mould, MD;  Location: [place];  Service: Vascular;  Laterality: Right;  . HERNIA REPAIR    . PATCH ANGIOPLASTY Left 05/06/2012    Procedure: WITH DACRON PATCH ANGIOPLASTY ;  Surgeon: Angelia Mould, MD;  Location: [place];  Service: Vascular;  Laterality: Left;  . POLYPECTOMY  11/19/2018   Procedure: POLYPECTOMY;  Surgeon: Lucilla Lame, MD;  Location: [place];  Service: Endoscopy;;  . [place]  2004  . TONSILLECTOMY    . TUBAL LIGATION      Medical History: Past Medical History:  Diagnosis Date  . Anxiety   . Arthritis   . Carotid artery occlusion   . Chronic kidney disease May 2017   UTI  . Depression   . Diabetes ([place])   . Diverticulosis   . Fatty liver   . Fibromyalgia   . GERD (gastroesophageal reflux disease)   . H/O hiatal hernia   . Hypertension   . IBS (irritable bowel syndrome)   . Peptic ulcer   .  Plantar fasciitis   . Stroke [facility]) Dec. 14,2013   Right side-ministroke    Family History: Family History  Problem Relation Age of Onset  . Hypertension Mother   . Hyperlipidemia Mother   . Deep vein thrombosis Mother   . Cancer Father   . Alcohol abuse Father   . Hypertension Maternal Grandmother   . Deep vein thrombosis Sister   . Alcohol abuse Sister   . Alcohol abuse Sister     Social History   Socioeconomic History  . Marital status: Married    Spouse name: Not on file  . Number of children: Not on file  . Years of education: Not on file  . Highest education level: Not on file  Occupational History  . Not on file  Tobacco Use  . Smoking status: Current Every Day Smoker    Packs/day: 1.00    Years: 40.00    Pack years: 40.00    Types: Cigarettes    Start date: 11/09/1970  . Smokeless tobacco: Never Used  . Tobacco comment:    Substance and Sexual Activity  . Alcohol use: No    Alcohol/week: 0.0 standard drinks    Comment:   quit 2 years   . Drug use: No  . Sexual activity: Not Currently    Partners: Male  Other Topics Concern  . Not on file  Social History Narrative  . Not on file   Social Determinants of Health   Financial Resource Strain:    . Difficulty of Paying Living Expenses: Not on file  Food Insecurity:   . Worried About Charity fundraiser in the Last Year: Not on file  . Ran Out of Food in the Last Year: Not on file  Transportation Needs:   . Lack of Transportation (Medical): Not on file  . Lack of Transportation (Non-Medical): Not on file  Physical Activity:   . Days of Exercise per Week: Not on file  . Minutes of Exercise per Session: Not on file  Stress:   . Feeling of Stress : Not on file  Social Connections:   . Frequency of Communication with Friends and Family: Not on file  . Frequency of Social Gatherings with Friends and Family: Not on file  . Attends Religious Services: Not on file  . Active Member of Clubs or Organizations: Not on file  . Attends Archivist Meetings: Not on file  . Marital Status: Not on file  Intimate Partner Violence:   . Fear of Current or Ex-Partner: Not on file  . Emotionally Abused: Not on file  . Physically Abused: Not on file  . Sexually Abused: Not on file      Review of Systems  Constitutional: Positive for fatigue. Negative for activity change, appetite change, chills, diaphoresis, fever and unexpected weight change.  HENT: Negative for congestion, ear discharge, ear pain, facial swelling, postnasal drip, rhinorrhea, sinus pressure, sneezing, sore throat, trouble swallowing and voice change.   Respiratory: Negative for apnea, chest tightness, shortness of breath and wheezing.   Cardiovascular: Negative for chest pain and palpitations.  Gastrointestinal: Positive for abdominal pain and diarrhea. Negative for blood in stool, nausea and vomiting.       Cramping. Has been more severe past four days.   Endocrine: Negative for cold intolerance, heat intolerance, polyphagia and polyuria.       Blood sugar was 184 this morning.   Musculoskeletal: Positive for arthralgias, back pain and myalgias.  Allergic/Immunologic: Negative for environmental allergies.  Neurological: Negative for dizziness, tremors, facial asymmetry, weakness, light-headedness and headaches.  Hematological: Negative for adenopathy.  Psychiatric/Behavioral: Positive for dysphoric mood. The patient is nervous/anxious.        The patient states that she persistently feels overwhelmed and has an "I don't care" attitude.     Today's Vitals   03/28/19 1423  Weight: 193 lb (87.5 kg)  Height: 5\' 3"  (1.6 m)   Body mass index is 34.19 kg/m.  Observation/Objective:   The patient is alert and oriented. She is pleasant and answers all questions appropriately. Breathing is non-labored. She is in no acute distress at this time. The patient looks fatigued and as though she does not feel well.     Assessment/Plan:  1. Type 2 diabetes mellitus with hyperglycemia, with long-term current use of insulin (HCC) Recently started back on ozempic. Continue weekly. She should also continue insulin as prescribed. Monitor blood sugars closely.   2. Irritable bowel syndrome with diarrhea Recommend she add back questran up to four times daily to treat abdominal cramps and diarrhea.   3. Non-intractable vomiting with nausea, unspecified vomiting type May take zofran 8mg  as needed and as prescribed  - ondansetron (ZOFRAN ODT) 8 MG disintegrating tablet; Take 1 tablet (8 mg total) by mouth every 8 (eight) hours as needed for nausea.  Dispense: 30 tablet; Refill: 3  4. Inflammatory polyarthritis ([place]) May continue to take tramadol up to 3 times daily if needed for pain. A new prescription was sent to her pharmacy today.  - traMADol (ULTRAM) 50 MG tablet; Take 1 tablet (50 mg total) by mouth 3 (three) times daily as needed for moderate pain.  Dispense: 90 tablet; Refill: 3  5. Essential hypertension Stable. Continue bp medication as prescribed   General Counseling: ellia jarret understanding of the findings of today's phone visit and agrees with plan of treatment. I have discussed any  further diagnostic evaluation that may be needed or ordered today. We also reviewed her medications today. she has been encouraged to call the office with any questions or concerns that should arise related to todays visit.  Diabetes Counseling:  1. Addition of ACE inh/ ARB'S for nephroprotection. Microalbumin is updated  2. Diabetic foot care, prevention of complications. Podiatry consult 3. Exercise and lose weight.  4. Diabetic eye examination, Diabetic eye exam is updated  5. Monitor blood sugar closlely. nutrition counseling.  6. Sign and symptoms of hypoglycemia including shaking sweating,confusion and headaches.  This patient was seen by [place] with Dr Lavera Guise as a part of collaborative care agreement  Meds ordered this encounter  Medications  . ondansetron (ZOFRAN ODT) 8 MG disintegrating tablet    Sig: Take 1 tablet (8 mg total) by mouth every 8 (eight) hours as needed for nausea.    Dispense:  30 tablet    Refill:  3    Order Specific Question:   Supervising Provider    Answer:   Lavera Guise [id number]  . traMADol (ULTRAM) 50 MG tablet    Sig: Take 1 tablet (50 mg total) by mouth 3 (three) times daily as needed for moderate pain.    Dispense:  90 tablet    Refill:  3    Confirmed with patient no allergy to tramadol    Order Specific Question:   Supervising Provider    Answer:   Lavera Guise [[id number]]    Time spent: 16 Minutes    Dr Lavera Guise Internal medicine

## 2019-04-18 ENCOUNTER — Other Ambulatory Visit: Payer: Self-pay

## 2019-04-18 DIAGNOSIS — E1142 Type 2 diabetes mellitus with diabetic polyneuropathy: Secondary | ICD-10-CM

## 2019-04-18 MED ORDER — GABAPENTIN 600 MG PO TABS: ORAL_TABLET | ORAL | 2 refills | Status: DC

## 2019-05-06 ENCOUNTER — Ambulatory Visit (INDEPENDENT_AMBULATORY_CARE_PROVIDER_SITE_OTHER): Payer: Medicare Other | Admitting: Podiatry

## 2019-05-06 ENCOUNTER — Encounter: Payer: Self-pay | Admitting: Podiatry

## 2019-05-06 ENCOUNTER — Other Ambulatory Visit: Payer: Self-pay

## 2019-05-06 ENCOUNTER — Ambulatory Visit (INDEPENDENT_AMBULATORY_CARE_PROVIDER_SITE_OTHER): Payer: Medicare Other

## 2019-05-06 VITALS — Temp 98.4°F

## 2019-05-06 DIAGNOSIS — M722 Plantar fascial fibromatosis: Secondary | ICD-10-CM

## 2019-05-09 NOTE — Progress Notes (Signed)
   HPI:  59 y.o. female presenting today with a chief complaint of stabbing pain to the plantar arches bilaterally that has been ongoing for the past few years. She states it feels like she is walking on marbles. She states the pain has been improving from the past 2-3 weeks when it was the worst. Walking increases the pain. She has had injections in the past for treatment and has been taking Tylenol and Ibuprofen. Patient is here for further evaluation and treatment.   Past Medical History:  Diagnosis Date  . Anxiety   . Arthritis   . Carotid artery occlusion   . Chronic kidney disease May 2017   UTI  . Depression   . Diabetes ([place])   . Diverticulosis   . Fatty liver   . Fibromyalgia   . GERD (gastroesophageal reflux disease)   . H/O hiatal hernia   . Hypertension   . IBS (irritable bowel syndrome)   . Peptic ulcer   . Plantar fasciitis   . Stroke [facility]) Dec. 14,2013   Right side-ministroke      Physical Exam: General: The patient is alert and oriented x3 in no acute distress.  Dermatology: Skin is warm, dry and supple bilateral lower extremities. Negative for open lesions or macerations.  Vascular: Palpable pedal pulses bilaterally. No edema or erythema noted. Capillary refill within normal limits.  Neurological: Epicritic and protective threshold grossly intact bilaterally.   Musculoskeletal Exam: Palpable nodule noted to the plantar medial longitudinal arch of the bilateral feet. Pain with palpation also noted to the area. Range of motion within normal limits to all pedal and ankle joints bilateral. Muscle strength 5/5 in all groups bilateral.   Radiographic Exam:  Normal osseous mineralization. Joint spaces preserved. No fracture/dislocation/boney destruction.    Assessment: 1. plantar fibroma bilateral    Plan of Care:  1. Patient evaluated. X-Rays reviewed.  2. Injection of 0.5 mLs Celestone Soluspan injected into the fibromas of the bilateral feet.  3.  Prescription for Medrol Dose Pak provided to patient. Then continue taking Meloxicam as directed by prescribing physician.  4. Return to clinic as needed.     Edrick Kins, DPM [facility]  Dr. Edrick Kins, DPM    [address], [place] [address]                Office [phone]  Fax [phone]

## 2019-05-10 ENCOUNTER — Telehealth: Payer: Self-pay

## 2019-05-10 MED ORDER — METHYLPREDNISOLONE 4 MG PO TABS: ORAL_TABLET | ORAL | 0 refills | Status: DC

## 2019-05-10 NOTE — Telephone Encounter (Signed)
Patient called and stated that the steroid medication Dr. Amalia Hailey was going to call in has not been sent to pharmacy yet.  I informed her that I was called in today.  She verified her pharmacy at [place].

## 2019-06-20 ENCOUNTER — Other Ambulatory Visit: Payer: Self-pay

## 2019-06-20 DIAGNOSIS — M064 Inflammatory polyarthropathy: Secondary | ICD-10-CM

## 2019-06-20 MED ORDER — FLUCONAZOLE 150 MG PO TABS: ORAL_TABLET | ORAL | 0 refills | Status: DC

## 2019-06-20 MED ORDER — MELOXICAM 15 MG PO TABS: 15.0000 mg | ORAL_TABLET | Freq: Every day | ORAL | 3 refills | Status: DC

## 2019-06-27 ENCOUNTER — Ambulatory Visit: Payer: Medicare Other | Admitting: Nurse Practitioner

## 2019-06-27 ENCOUNTER — Other Ambulatory Visit: Payer: Self-pay

## 2019-06-27 DIAGNOSIS — I10 Essential (primary) hypertension: Secondary | ICD-10-CM

## 2019-06-27 MED ORDER — HYDROCHLOROTHIAZIDE 12.5 MG PO TABS: ORAL_TABLET | ORAL | 5 refills | Status: DC

## 2019-07-08 ENCOUNTER — Ambulatory Visit: Payer: Medicare Other | Admitting: Nurse Practitioner

## 2019-07-28 ENCOUNTER — Encounter: Payer: Self-pay | Admitting: Nurse Practitioner

## 2019-07-28 ENCOUNTER — Ambulatory Visit (INDEPENDENT_AMBULATORY_CARE_PROVIDER_SITE_OTHER): Payer: Medicare Other | Admitting: Nurse Practitioner

## 2019-07-28 ENCOUNTER — Other Ambulatory Visit: Payer: Self-pay

## 2019-07-28 VITALS — BP 123/79 | HR 93 | Temp 97.0°F | Ht 67.0 in | Wt 191.2 lb

## 2019-07-28 DIAGNOSIS — I10 Essential (primary) hypertension: Secondary | ICD-10-CM

## 2019-07-28 DIAGNOSIS — F321 Major depressive disorder, single episode, moderate: Secondary | ICD-10-CM | POA: Diagnosis not present

## 2019-07-28 DIAGNOSIS — E039 Hypothyroidism, unspecified: Secondary | ICD-10-CM | POA: Diagnosis not present

## 2019-07-28 DIAGNOSIS — K580 Irritable bowel syndrome with diarrhea: Secondary | ICD-10-CM | POA: Diagnosis not present

## 2019-07-28 DIAGNOSIS — E1165 Type 2 diabetes mellitus with hyperglycemia: Secondary | ICD-10-CM

## 2019-07-28 DIAGNOSIS — E782 Mixed hyperlipidemia: Secondary | ICD-10-CM

## 2019-07-28 LAB — POCT GLYCOSYLATED HEMOGLOBIN (HGB A1C): Hemoglobin A1C: 9.7 % — AB (ref 4.0–5.6)

## 2019-07-28 MED ORDER — ZENPEP 40000-126000 UNITS PO CPEP: 1.0000 | ORAL_CAPSULE | Freq: Three times a day (TID) | ORAL | 3 refills | Status: DC

## 2019-07-28 MED ORDER — HYDROCHLOROTHIAZIDE 12.5 MG PO TABS: ORAL_TABLET | ORAL | 5 refills | Status: DC

## 2019-07-28 MED ORDER — DEXCOM G6 SENSOR MISC: 5 refills | Status: DC

## 2019-07-28 MED ORDER — DEXCOM G6 RECEIVER DEVI: 5 refills | Status: DC

## 2019-07-28 MED ORDER — TRESIBA FLEXTOUCH 100 UNIT/ML ~~LOC~~ SOPN: PEN_INJECTOR | SUBCUTANEOUS | 5 refills | Status: DC

## 2019-07-28 MED ORDER — DEXCOM G6 TRANSMITTER MISC: 5 refills | Status: DC

## 2019-07-28 NOTE — Progress Notes (Signed)
[facility] [place], [place] [address]  Internal MEDICINE  Office Visit Note  Patient Name: Morgan Daniels  [id number]  [id number]  Date of Service: 07/28/2019  Chief Complaint  Patient presents with  . Follow-up  . Hypertension  . Diabetes  . Depression  . Anxiety    The patient is here for routine follow up. Blood sugars are elevated. HgbA1c is 9.7, up from 9.0 at her last check. States that she has had to limit what she can eat to foods which are bland, often including macaroni and cheese and potatoes. She has been unable to tolerate other foods, higher in protein and lower in carbohydrates as her stomach and intestines continue to give her a lot of problems. States that frequent bouts of diarrhea start as soon as she wakes up in the morning. She has several rounds of severe intestinal cramping and diarrhea throughout nearly every day. She did have trial of Questran. She was unable to tolerate this. Made her very nauseated. She has also stopped taking her ozempic. She states that for two days after taking ozempic, she would have severe fatigue and body aches. She did have colonoscopy 12/2018. Two benign polyps were removed but no other abnormalities were found. She has lost two pounds since she was last seen.  Blood pressure is well managed.  She is due to have routine, fasting labs. Will check serum amylase and lipase as well. Prior checks were normal.       Current Medication: Outpatient Encounter Medications as of 07/28/2019  Medication Sig Note  . cetirizine (ZYRTEC) 10 MG tablet Take 10 mg by mouth daily.   . Cholecalciferol (VITAMIN D-3) 5000 UNITS TABS Take 5,000 Units by mouth daily.    . fluconazole (DIFLUCAN) 150 MG tablet Take 1 tab by mouth once. May repeat in 3 days for persistent symptoms.   [name] FLUoxetine (PROZAC) 40 MG capsule Take 2 capsules (80 mg total) by mouth daily.   [name] gabapentin (NEURONTIN) 600 MG tablet TAKE 1 TABLET BY MOUTH THREE  TIMES A DAY.   . hydrochlorothiazide (HYDRODIURIL) 12.5 MG tablet TAKE 1 TABLET BY MOUTH DAILY FOR BLOOD PRESSURE/EDEMA   . insulin degludec (TRESIBA FLEXTOUCH) 100 UNIT/ML FlexTouch Pen Inject 70 units  QD - E11.5   . Insulin Pen Needle (NOVOFINE) 32G X 6 MM MISC Use as directed with Victoza  Dx code E11.65   . meloxicam (MOBIC) 15 MG tablet Take 1 tablet (15 mg total) by mouth daily.   . metoprolol succinate (TOPROL-XL) 50 MG 24 hr tablet Take 1 tablet (50 mg total) by mouth daily. Take with or immediately following a meal.   . omeprazole (PRILOSEC) 20 MG capsule Take 20 mg by mouth daily as needed.   . ondansetron (ZOFRAN ODT) 8 MG disintegrating tablet Take 1 tablet (8 mg total) by mouth every 8 (eight) hours as needed for nausea.   Glory Rosebush DELICA LANCETS FINE MISC CHECK BLOOD SUGAR THREE TIMES A DAY 11/09/2014: Received from: External Pharmacy  . ONETOUCH VERIO test strip CHECK BLOOD SUGAR THREE TIMES A DAY 11/09/2014: Received from: External Pharmacy  . polyethylene glycol (GOLYTELY) 236 g solution Drink one 8 oz glass every 20 mins until entire container is finished starting at 5:00pm on 11/18/18   . tiZANidine (ZANAFLEX) 4 MG tablet TAKE 1 TABLET(S) BY MOUTH TWICE A DAY AS NEEDED FOR MUSCLE PAIN AND SPASMS   . traMADol (ULTRAM) 50 MG tablet Take 1 tablet (50 mg total)  by mouth 3 (three) times daily as needed for moderate pain.   . [DISCONTINUED] cholestyramine (QUESTRAN) 4 g packet Take 1 packet (4 g total) by mouth 4 (four) times daily. 07/28/2019: caused increased nausea  . [DISCONTINUED] Continuous Blood Gluc Sensor (FREESTYLE LIBRE 14 DAY SENSOR) MISC USE AS DIRECTED   . [DISCONTINUED] hydrochlorothiazide (HYDRODIURIL) 12.5 MG tablet TAKE 1 TABLET BY MOUTH DAILY FOR BLOOD PRESSURE/EDEMA   . [DISCONTINUED] insulin degludec (TRESIBA FLEXTOUCH) 100 UNIT/ML SOPN FlexTouch Pen Inject 0.6 mLs (60 Units total) into the skin daily.   . [DISCONTINUED] methylPREDNISolone (MEDROL) 4 MG tablet Take  as directed   . [DISCONTINUED] Semaglutide,0.25 or 0.5MG /DOS, (OZEMPIC, 0.25 OR 0.5 MG/DOSE,) 2 MG/1.5ML SOPN Inject 0.5 mg into the skin once a week. 07/28/2019: caused severe body aches and fatigue   . Continuous Blood Gluc Receiver (DEXCOM G6 RECEIVER) DEVI Use as directed fto check blood sugars frequently - DX E11.65   . Continuous Blood Gluc Sensor (DEXCOM G6 SENSOR) MISC Use as directed fto check blood sugars frequently - DX E11.65   . Continuous Blood Gluc Transmit (DEXCOM G6 TRANSMITTER) MISC Apply [place] as directed to check blood sugars frequently - DX E11.65   . Pancrelipase, Lip-Prot-Amyl, (ZENPEP) 40000-126000 units CPEP Take 1 capsule by mouth with breakfast, with lunch, and with evening meal.    No facility-administered encounter medications on file as of 07/28/2019.    Surgical History: Past Surgical History:  Procedure Laterality Date  . ABDOMINAL HYSTERECTOMY  1990  . BACK SURGERY  2000, 2004  . CAROTID ENDARTERECTOMY Left 05/06/12  . CARPAL TUNNEL RELEASE Left 07/18/2015   Procedure: CARPAL TUNNEL RELEASE;  Surgeon: Earnestine Leys, MD;  Location: [facility];  Service: Orthopedics;  Laterality: Left;  . CEREBRAL ANGIOGRAM Bilateral 05/03/2012   Procedure: CEREBRAL ANGIOGRAM;  Surgeon: Angelia Mould, MD;  Location: [facility] CATH LAB;  Service: Cardiovascular;  Laterality: Bilateral;  . CHOLECYSTECTOMY  2001  . COLONOSCOPY WITH PROPOFOL N/A 11/19/2018   Procedure: COLONOSCOPY WITH PROPOFOL;  Surgeon: Lucilla Lame, MD;  Location: [place];  Service: Endoscopy;  Laterality: N/A;  Diabetic - insulin  . ENDARTERECTOMY Left 05/06/2012   Procedure: ENDARTERECTOMY CAROTID;  Surgeon: Angelia Mould, MD;  Location: [place];  Service: Vascular;  Laterality: Left;  . ENDARTERECTOMY Right 08/09/2013   Procedure: ENDARTERECTOMY CAROTID-RIGHT;  Surgeon: Angelia Mould, MD;  Location: [place];  Service: Vascular;  Laterality: Right;  . HERNIA REPAIR    . PATCH ANGIOPLASTY Left  05/06/2012   Procedure: WITH DACRON PATCH ANGIOPLASTY ;  Surgeon: Angelia Mould, MD;  Location: [place];  Service: Vascular;  Laterality: Left;  . POLYPECTOMY  11/19/2018   Procedure: POLYPECTOMY;  Surgeon: Lucilla Lame, MD;  Location: [place];  Service: Endoscopy;;  . [place]  2004  . TONSILLECTOMY    . TUBAL LIGATION      Medical History: Past Medical History:  Diagnosis Date  . Anxiety   . Arthritis   . Carotid artery occlusion   . Chronic kidney disease May 2017   UTI  . Depression   . Diabetes ([place])   . Diverticulosis   . Fatty liver   . Fibromyalgia   . GERD (gastroesophageal reflux disease)   . H/O hiatal hernia   . Hypertension   . IBS (irritable bowel syndrome)   . Peptic ulcer   . Plantar fasciitis   . Stroke [facility]) Dec. 14,2013   Right side-ministroke    Family History: Family History  Problem Relation Age of Onset  . Hypertension Mother   . Hyperlipidemia Mother   . Deep vein thrombosis Mother   . Cancer Father   . Alcohol abuse Father   . Hypertension Maternal Grandmother   . Deep vein thrombosis Sister   . Alcohol abuse Sister   . Alcohol abuse Sister     Social History   Socioeconomic History  . Marital status: Married    Spouse name: Not on file  . Number of children: Not on file  . Years of education: Not on file  . Highest education level: Not on file  Occupational History  . Not on file  Tobacco Use  . Smoking status: Current Every Day Smoker    Packs/day: 1.00    Years: 40.00    Pack years: 40.00    Types: Cigarettes    Start date: 11/09/1970  . Smokeless tobacco: Never Used  . Tobacco comment:    Vaping Use  . Vaping Use: Former  Substance and Sexual Activity  . Alcohol use: Yes    Alcohol/week: 0.0 standard drinks    Comment: rarely  . Drug use: No  . Sexual activity: Not Currently    Partners: Male  Other Topics Concern  . Not on file  Social History Narrative  . Not on file   Social Determinants  of Health   Financial Resource Strain:   . Difficulty of Paying Living Expenses:   Food Insecurity:   . Worried About Charity fundraiser in the Last Year:   . Arboriculturist in the Last Year:   Transportation Needs:   . Film/video editor (Medical):   [name] Lack of Transportation (Non-Medical):   Physical Activity:   . Days of Exercise per Week:   . Minutes of Exercise per Session:   Stress:   . Feeling of Stress :   Social Connections:   . Frequency of Communication with Friends and Family:   . Frequency of Social Gatherings with Friends and Family:   . Attends Religious Services:   . Active Member of Clubs or Organizations:   . Attends Archivist Meetings:   [name] Marital Status:   Intimate Partner Violence:   . Fear of Current or Ex-Partner:   . Emotionally Abused:   [name] Physically Abused:   . Sexually Abused:       Review of Systems  Constitutional: Positive for appetite change and fatigue. Negative for activity change, chills, diaphoresis, fever and unexpected weight change.  HENT: Negative for congestion, ear discharge, ear pain, facial swelling, postnasal drip, rhinorrhea, sinus pressure, sneezing, sore throat, trouble swallowing and voice change.   Respiratory: Negative for apnea, chest tightness, shortness of breath and wheezing.   Cardiovascular: Negative for chest pain and palpitations.  Gastrointestinal: Positive for abdominal pain and diarrhea. Negative for blood in stool, nausea and vomiting.       Frequent episodes of intestinal cramping and diarrhea.   Endocrine: Negative for cold intolerance, heat intolerance, polyphagia and polyuria.       Elevated blood sugars. Has not been taking ozempic due to negative side effects.   Musculoskeletal: Positive for arthralgias, back pain and myalgias.  Allergic/Immunologic: Negative for environmental allergies.  Neurological: Negative for dizziness, tremors, facial asymmetry, weakness, light-headedness and  headaches.  Hematological: Negative for adenopathy.  Psychiatric/Behavioral: Positive for dysphoric mood. The patient is nervous/anxious.        The patient states that she persistently feels overwhelmed and has an "I  don't care" attitude.    Today's Vitals   07/28/19 1411  BP: 123/79  Pulse: 93  Temp: (!) 97 F (36.1 C)  SpO2: 91%  Weight: 191 lb 3.2 oz (86.7 kg)  Height: 5\' 7"  (1.702 m)   Body mass index is 29.95 kg/m.   Physical Exam Vitals and nursing note reviewed.  Constitutional:      General: She is not in acute distress.    Appearance: Normal appearance. She is well-developed. She is not diaphoretic.  HENT:     Head: Normocephalic and atraumatic.     Mouth/Throat:     Pharynx: No oropharyngeal exudate.  Eyes:     Pupils: Pupils are equal, round, and reactive to light.  Neck:     Thyroid: No thyromegaly.     Vascular: No JVD.     Trachea: No tracheal deviation.  Cardiovascular:     Rate and Rhythm: Normal rate and regular rhythm.     Heart sounds: Normal heart sounds. No murmur heard.  No friction rub. No gallop.   Pulmonary:     Effort: Pulmonary effort is normal. No respiratory distress.     Breath sounds: Normal breath sounds. No wheezing or rales.  Chest:     Chest wall: No tenderness.  Abdominal:     General: Bowel sounds are normal.     Palpations: Abdomen is soft.     Tenderness: There is no abdominal tenderness.  Musculoskeletal:        General: Normal range of motion.     Cervical back: Normal range of motion and neck supple.  Lymphadenopathy:     Cervical: No cervical adenopathy.  Skin:    General: Skin is warm and dry.  Neurological:     Mental Status: She is alert and oriented to person, place, and time. Mental status is at baseline.     Cranial Nerves: No cranial nerve deficit.  Psychiatric:        Mood and Affect: Mood normal.        Behavior: Behavior normal.        Thought Content: Thought content normal.        Judgment: Judgment  normal.    Assessment/Plan: 1. Uncontrolled type 2 diabetes mellitus with hyperglycemia (HCC) - POCT HgB A1C 9.7, up from 9.0 at her last visit. Increase tresiba to 70units daily. Continue other diabetic medication as prescribed. Will try to get dexCom continuous glucose monitor for her so she can have better assessment of glucose levels.  - Continuous Blood Gluc Transmit (DEXCOM G6 TRANSMITTER) MISC; Apply [place] as directed to check blood sugars frequently - DX E11.65  Dispense: 1 each; Refill: 5 - Continuous Blood Gluc Sensor (DEXCOM G6 SENSOR) MISC; Use as directed fto check blood sugars frequently - DX E11.65  Dispense: 1 each; Refill: 5 - Continuous Blood Gluc Receiver ([place]) DEVI; Use as directed fto check blood sugars frequently - DX E11.65  Dispense: 1 each; Refill: 5 - insulin degludec (TRESIBA FLEXTOUCH) 100 UNIT/ML FlexTouch Pen; Inject 70 units [place] QD - E11.5  Dispense: 5 pen; Refill: 5  2. Essential hypertension Stable. Continue bp medication as prescribed.  - hydrochlorothiazide (HYDRODIURIL) 12.5 MG tablet; TAKE 1 TABLET BY MOUTH DAILY FOR BLOOD PRESSURE/EDEMA  Dispense: 30 tablet; Refill: 5  3. Irritable bowel syndrome with diarrhea Trial of Zenpep, taking 1 capsule by mouth with every meal. Patient should continue regular visits with GI as scheduled.  - Pancrelipase, Lip-Prot-Amyl, (ZENPEP) 40000-126000 units CPEP; Take  1 capsule by mouth with breakfast, with lunch, and with evening meal.  Dispense: 120 capsule; Refill: 3  4. Acquired hypothyroidism Check labs with thyroid panel and adjust levothyroxine as indicated.   5. Mixed hyperlipidemia Check routine, fasting labs and adjust cholesterol lowering medication as indicated.   6. Depression, major, single episode, moderate ([place]) Continue visits with neuropshyciatry as scheduled.   General Counseling: ajah vanhoose understanding of the findings of todays visit and agrees with plan of treatment. I have  discussed any further diagnostic evaluation that may be needed or ordered today. We also reviewed her medications today. she has been encouraged to call the office with any questions or concerns that should arise related to todays visit.  Diabetes Counseling:  1. Addition of ACE inh/ ARB'S for nephroprotection. Microalbumin is updated  2. Diabetic foot care, prevention of complications. Podiatry consult 3. Exercise and lose weight.  4. Diabetic eye examination, Diabetic eye exam is updated  5. Monitor blood sugar closlely. nutrition counseling.  6. Sign and symptoms of hypoglycemia including shaking sweating,confusion and headaches.  This patient was seen by Leretha Pol FNP Collaboration with Dr Lavera Guise as a part of collaborative care agreement  Orders Placed This Encounter  Procedures  . POCT HgB A1C    Meds ordered this encounter  Medications  . Continuous Blood Gluc Transmit (DEXCOM G6 TRANSMITTER) MISC    Sig: Apply [place] as directed to check blood sugars frequently - DX E11.65    Dispense:  1 each    Refill:  5    Order Specific Question:   Supervising Provider    Answer:   Lavera Guise [place]  . Continuous Blood Gluc Sensor (DEXCOM G6 SENSOR) MISC    Sig: Use as directed fto check blood sugars frequently - DX E11.65    Dispense:  1 each    Refill:  5    Order Specific Question:   Supervising Provider    Answer:   Lavera Guise [place]  . Continuous Blood Gluc Receiver ([place]) DEVI    Sig: Use as directed fto check blood sugars frequently - DX E11.65    Dispense:  1 each    Refill:  5    Order Specific Question:   Supervising Provider    Answer:   Lavera Guise [place]  . insulin degludec (TRESIBA FLEXTOUCH) 100 UNIT/ML FlexTouch Pen    Sig: Inject 70 units [place] QD - E11.5    Dispense:  5 pen    Refill:  5    Gradually increasing dose to 70 units daily. .    Order Specific Question:   Supervising Provider    Answer:   Lavera Guise [[id number]]  .  hydrochlorothiazide (HYDRODIURIL) 12.5 MG tablet    Sig: TAKE 1 TABLET BY MOUTH DAILY FOR BLOOD PRESSURE/EDEMA    Dispense:  30 tablet    Refill:  5    Order Specific Question:   Supervising Provider    Answer:   Lavera Guise [[id number]]  . Pancrelipase, Lip-Prot-Amyl, (ZENPEP) 40000-126000 units CPEP    Sig: Take 1 capsule by mouth with breakfast, with lunch, and with evening meal.    Dispense:  120 capsule    Refill:  3    Samples provided today. Patient did not tolerate [place]    Order Specific Question:   Supervising Provider    Answer:   Lavera Guise [[id number]]    Total time spent: 45 Minutes  Time spent includes review of chart, medications, test results, and follow up plan with the patient.      Dr Lavera Guise Internal medicine

## 2019-08-08 ENCOUNTER — Telehealth: Payer: Self-pay | Admitting: Nurse Practitioner

## 2019-08-08 ENCOUNTER — Other Ambulatory Visit: Payer: Self-pay | Admitting: Nurse Practitioner

## 2019-08-08 DIAGNOSIS — B3731 Acute candidiasis of vulva and vagina: Secondary | ICD-10-CM

## 2019-08-08 MED ORDER — FLUCONAZOLE 150 MG PO TABS: ORAL_TABLET | ORAL | 1 refills | Status: DC

## 2019-08-08 NOTE — Telephone Encounter (Signed)
Sent prescription for diflucan 150mg  to [facility]. Take this once. If symptoms persist, she can repeat in three days. I sent #3 tablets with one refill.

## 2019-08-08 NOTE — Progress Notes (Signed)
Sent prescription for diflucan 150mg  to [facility]. Take this once. If symptoms persist, she can repeat in three days. I sent #3 tablets with one refill.

## 2019-09-08 ENCOUNTER — Telehealth: Payer: Self-pay

## 2019-09-08 NOTE — Telephone Encounter (Signed)
lmom to call us back about diabetic supply that she is using [facility] then we can send paper work

## 2019-09-09 ENCOUNTER — Other Ambulatory Visit: Payer: Self-pay

## 2019-09-09 DIAGNOSIS — F321 Major depressive disorder, single episode, moderate: Secondary | ICD-10-CM

## 2019-09-09 DIAGNOSIS — I10 Essential (primary) hypertension: Secondary | ICD-10-CM

## 2019-09-09 DIAGNOSIS — E1165 Type 2 diabetes mellitus with hyperglycemia: Secondary | ICD-10-CM

## 2019-09-09 MED ORDER — FLUOXETINE HCL 40 MG PO CAPS: 80.0000 mg | ORAL_CAPSULE | Freq: Every day | ORAL | 1 refills | Status: DC

## 2019-09-09 MED ORDER — TRESIBA FLEXTOUCH 100 UNIT/ML ~~LOC~~ SOPN: PEN_INJECTOR | SUBCUTANEOUS | 5 refills | Status: DC

## 2019-09-14 ENCOUNTER — Other Ambulatory Visit: Payer: Self-pay

## 2019-09-14 DIAGNOSIS — E1165 Type 2 diabetes mellitus with hyperglycemia: Secondary | ICD-10-CM

## 2019-09-14 MED ORDER — TRESIBA FLEXTOUCH 100 UNIT/ML ~~LOC~~ SOPN: PEN_INJECTOR | SUBCUTANEOUS | 5 refills | Status: DC

## 2019-09-20 ENCOUNTER — Other Ambulatory Visit: Payer: Self-pay

## 2019-09-20 DIAGNOSIS — E1165 Type 2 diabetes mellitus with hyperglycemia: Secondary | ICD-10-CM

## 2019-09-20 MED ORDER — TRESIBA FLEXTOUCH 100 UNIT/ML ~~LOC~~ SOPN: PEN_INJECTOR | SUBCUTANEOUS | 5 refills | Status: DC

## 2019-10-12 ENCOUNTER — Other Ambulatory Visit: Payer: Self-pay

## 2019-10-12 DIAGNOSIS — M064 Inflammatory polyarthropathy: Secondary | ICD-10-CM

## 2019-10-12 MED ORDER — TRAMADOL HCL 50 MG PO TABS: 50.0000 mg | ORAL_TABLET | Freq: Three times a day (TID) | ORAL | 0 refills | Status: DC | PRN

## 2019-10-28 ENCOUNTER — Ambulatory Visit: Payer: Medicare Other | Admitting: Nurse Practitioner

## 2019-12-23 ENCOUNTER — Encounter: Payer: Self-pay | Admitting: Nurse Practitioner

## 2019-12-23 ENCOUNTER — Other Ambulatory Visit: Payer: Self-pay

## 2019-12-23 ENCOUNTER — Ambulatory Visit: Payer: Medicare Other | Admitting: Nurse Practitioner

## 2019-12-23 VITALS — BP 122/70 | HR 80 | Temp 97.4°F | Resp 16 | Ht 67.0 in | Wt 183.0 lb

## 2019-12-23 DIAGNOSIS — Z0001 Encounter for general adult medical examination with abnormal findings: Secondary | ICD-10-CM

## 2019-12-23 DIAGNOSIS — Z1231 Encounter for screening mammogram for malignant neoplasm of breast: Secondary | ICD-10-CM

## 2019-12-23 DIAGNOSIS — M064 Inflammatory polyarthropathy: Secondary | ICD-10-CM

## 2019-12-23 DIAGNOSIS — K580 Irritable bowel syndrome with diarrhea: Secondary | ICD-10-CM | POA: Diagnosis not present

## 2019-12-23 DIAGNOSIS — Z23 Encounter for immunization: Secondary | ICD-10-CM

## 2019-12-23 DIAGNOSIS — E1142 Type 2 diabetes mellitus with diabetic polyneuropathy: Secondary | ICD-10-CM

## 2019-12-23 DIAGNOSIS — E1165 Type 2 diabetes mellitus with hyperglycemia: Secondary | ICD-10-CM | POA: Diagnosis not present

## 2019-12-23 DIAGNOSIS — I10 Essential (primary) hypertension: Secondary | ICD-10-CM | POA: Diagnosis not present

## 2019-12-23 LAB — POCT GLYCOSYLATED HEMOGLOBIN (HGB A1C): Hemoglobin A1C: 12.5 % — AB (ref 4.0–5.6)

## 2019-12-23 MED ORDER — ADVOCATE INSULIN PEN NEEDLES 33G X 4 MM MISC: 1 refills | Status: DC

## 2019-12-23 MED ORDER — DEXCOM G6 SENSOR MISC: 5 refills | Status: DC

## 2019-12-23 MED ORDER — DEXCOM G6 TRANSMITTER MISC: 5 refills | Status: DC

## 2019-12-23 MED ORDER — INSULIN LISPRO 100 UNIT/ML ~~LOC~~ SOLN: SUBCUTANEOUS | 11 refills | Status: DC

## 2019-12-23 NOTE — Progress Notes (Signed)
[facility] [place], [place] [address]  Internal MEDICINE  Office Visit Note  Patient Name: Morgan Daniels  [id number]  [id number]  Date of Service: 12/29/2019   Pt is here for routine health maintenance examination   Chief Complaint  Patient presents with  . Medicare Wellness  . Depression  . Diabetes  . Anxiety  . Hypertension  . policy update form    received     The patient is here for health maintenance exam. Blood sugars are elevated. HgbA1c is 12.5, up from 9.7 at her last check.she has been taking [place] 70 units.  States that she has had to limit what she can eat to foods which are bland, often including macaroni and cheese and potatoes. She has been unable to tolerate other foods, higher in protein and lower in carbohydrates as her stomach and intestines continue to give her a lot of problems. States that frequent bouts of diarrhea start as soon as she wakes up in the morning. She has several rounds of severe intestinal cramping and diarrhea throughout nearly every day. She did have trial of Questran. She was unable to tolerate this. Made her very nauseated.She did have colonoscopy 12/2018. Two benign polyps were removed but no other abnormalities were found. She has lost two pounds since she was last seen.  Blood pressure is well managed.  She would like to get a flu shot while she is here.      Current Medication: Outpatient Encounter Medications as of 12/23/2019  Medication Sig Note  . Cholecalciferol (VITAMIN D-3) 5000 UNITS TABS Take 5,000 Units by mouth daily.    . Continuous Blood Gluc Receiver (DEXCOM G6 RECEIVER) DEVI Use as directed fto check blood sugars frequently - DX E11.65   . Continuous Blood Gluc Sensor (DEXCOM G6 SENSOR) MISC Use as directed fto check blood sugars frequently - DX E11.65   . Continuous Blood Gluc Transmit (DEXCOM G6 TRANSMITTER) MISC Apply [place] as directed to check blood sugars frequently - DX E11.65   .  fluconazole (DIFLUCAN) 150 MG tablet Take 1 tab by mouth once. May repeat in 3 days for persistent symptoms.   [name] FLUoxetine (PROZAC) 40 MG capsule Take 2 capsules (80 mg total) by mouth daily.   [name] gabapentin (NEURONTIN) 600 MG tablet TAKE 1 TABLET BY MOUTH THREE TIMES A DAY.   . hydrochlorothiazide (HYDRODIURIL) 12.5 MG tablet TAKE 1 TABLET BY MOUTH DAILY FOR BLOOD PRESSURE/EDEMA   . insulin degludec (TRESIBA FLEXTOUCH) 100 UNIT/ML FlexTouch Pen Inject 70 units [place] QD - E11.5   . metoprolol succinate (TOPROL-XL) 50 MG 24 hr tablet Take 1 tablet (50 mg total) by mouth daily. Take with or immediately following a meal.   . omeprazole (PRILOSEC) 20 MG capsule Take 20 mg by mouth daily as needed.   . ondansetron (ZOFRAN ODT) 8 MG disintegrating tablet Take 1 tablet (8 mg total) by mouth every 8 (eight) hours as needed for nausea.   Glory Rosebush DELICA LANCETS FINE MISC CHECK BLOOD SUGAR THREE TIMES A DAY 11/09/2014: Received from: External Pharmacy  . ONETOUCH VERIO test strip CHECK BLOOD SUGAR THREE TIMES A DAY 11/09/2014: Received from: External Pharmacy  . Pancrelipase, Lip-Prot-Amyl, (ZENPEP) 40000-126000 units CPEP Take 1 capsule by mouth with breakfast, with lunch, and with evening meal.   . [DISCONTINUED] Continuous Blood Gluc Sensor (DEXCOM G6 SENSOR) MISC Use as directed fto check blood sugars frequently - DX E11.65   . [DISCONTINUED] Continuous Blood Gluc Transmit (DEXCOM  G6 TRANSMITTER) MISC Apply [place] as directed to check blood sugars frequently - DX E11.65   . [DISCONTINUED] Insulin Pen Needle (NOVOFINE) 32G X 6 MM MISC Use as directed with Victoza  Dx code E11.65   . [DISCONTINUED] tiZANidine (ZANAFLEX) 4 MG tablet TAKE 1 TABLET(S) BY MOUTH TWICE A DAY AS NEEDED FOR MUSCLE PAIN AND SPASMS   . [DISCONTINUED] traMADol (ULTRAM) 50 MG tablet Take 1 tablet (50 mg total) by mouth 3 (three) times daily as needed for moderate pain.   . cetirizine (ZYRTEC) 10 MG tablet Take 10 mg by mouth daily. (Patient  not taking: Reported on 12/23/2019)   . insulin lispro (HUMALOG) 100 UNIT/ML injection [place] use tid prior to meals according to sliding scale instructions. Max daily dose is 90 units per day. DX - E11.65   . Insulin Pen Needle (ADVOCATE INSULIN PEN NEEDLES) 33G X 4 MM MISC To use with insulin dosing TID prior to meals and QHS for basal insulin.   . meloxicam (MOBIC) 15 MG tablet Take 1 tablet (15 mg total) by mouth daily. (Patient not taking: Reported on 12/23/2019)   . polyethylene glycol (GOLYTELY) 236 g solution Drink one 8 oz glass every 20 mins until entire container is finished starting at 5:00pm on 11/18/18 (Patient not taking: Reported on 12/23/2019)    No facility-administered encounter medications on file as of 12/23/2019.    Surgical History: Past Surgical History:  Procedure Laterality Date  . ABDOMINAL HYSTERECTOMY  1990  . BACK SURGERY  2000, 2004  . CAROTID ENDARTERECTOMY Left 05/06/12  . CARPAL TUNNEL RELEASE Left 07/18/2015   Procedure: CARPAL TUNNEL RELEASE;  Surgeon: Earnestine Leys, MD;  Location: [facility] ORS;  Service: Orthopedics;  Laterality: Left;  . CEREBRAL ANGIOGRAM Bilateral 05/03/2012   Procedure: CEREBRAL ANGIOGRAM;  Surgeon: Angelia Mould, MD;  Location: [facility] CATH LAB;  Service: Cardiovascular;  Laterality: Bilateral;  . CHOLECYSTECTOMY  2001  . COLONOSCOPY WITH PROPOFOL N/A 11/19/2018   Procedure: COLONOSCOPY WITH PROPOFOL;  Surgeon: Lucilla Lame, MD;  Location: [place];  Service: Endoscopy;  Laterality: N/A;  Diabetic - insulin  . ENDARTERECTOMY Left 05/06/2012   Procedure: ENDARTERECTOMY CAROTID;  Surgeon: Angelia Mould, MD;  Location: [place];  Service: Vascular;  Laterality: Left;  . ENDARTERECTOMY Right 08/09/2013   Procedure: ENDARTERECTOMY CAROTID-RIGHT;  Surgeon: Angelia Mould, MD;  Location: [place];  Service: Vascular;  Laterality: Right;  . HERNIA REPAIR    . PATCH ANGIOPLASTY Left 05/06/2012   Procedure: WITH DACRON PATCH ANGIOPLASTY  ;  Surgeon: Angelia Mould, MD;  Location: [place];  Service: Vascular;  Laterality: Left;  . POLYPECTOMY  11/19/2018   Procedure: POLYPECTOMY;  Surgeon: Lucilla Lame, MD;  Location: [place];  Service: Endoscopy;;  . [place]  2004  . TONSILLECTOMY    . TUBAL LIGATION      Medical History: Past Medical History:  Diagnosis Date  . Anxiety   . Arthritis   . Carotid artery occlusion   . Chronic kidney disease May 2017   UTI  . Depression   . Diabetes ([place])   . Diverticulosis   . Fatty liver   . Fibromyalgia   . GERD (gastroesophageal reflux disease)   . H/O hiatal hernia   . Hypertension   . IBS (irritable bowel syndrome)   . Peptic ulcer   . Plantar fasciitis   . Stroke [facility]) Dec. 14,2013   Right side-ministroke    Family History: Family History  Problem Relation  Age of Onset  . Hypertension Mother   . Hyperlipidemia Mother   . Deep vein thrombosis Mother   . Cancer Father   . Alcohol abuse Father   . Hypertension Maternal Grandmother   . Deep vein thrombosis Sister   . Alcohol abuse Sister   . Alcohol abuse Sister       Review of Systems  Constitutional: Positive for appetite change and fatigue. Negative for activity change, chills, diaphoresis, fever and unexpected weight change.       The patient still needing to limit food intake to bland foods only.   HENT: Negative for congestion, ear discharge, ear pain, facial swelling, postnasal drip, rhinorrhea, sinus pressure, sneezing, sore throat, trouble swallowing and voice change.   Respiratory: Negative for apnea, chest tightness, shortness of breath and wheezing.   Cardiovascular: Negative for chest pain and palpitations.  Gastrointestinal: Positive for abdominal pain and diarrhea. Negative for blood in stool, nausea and vomiting.       Frequent episodes of intestinal cramping and diarrhea.   Endocrine: Negative for cold intolerance, heat intolerance, polyphagia and polyuria.       Blood  sugars continue to be very elevated.   Genitourinary: Positive for frequency. Negative for dysuria and urgency.  Musculoskeletal: Positive for arthralgias, back pain and myalgias.       Chronic joint pain.   Allergic/Immunologic: Negative for environmental allergies.  Neurological: Negative for dizziness, tremors, facial asymmetry, weakness, light-headedness and headaches.  Hematological: Negative for adenopathy.  Psychiatric/Behavioral: Positive for dysphoric mood. The patient is nervous/anxious.        The patient states that she persistently feels overwhelmed and has an "I don't care" attitude.      Today's Vitals   12/23/19 1124  BP: 122/70  Pulse: 80  Resp: 16  Temp: (!) 97.4 F (36.3 C)  SpO2: 95%  Weight: 183 lb (83 kg)  Height: 5\' 7"  (1.702 m)   Body mass index is 28.66 kg/m.  Physical Exam Vitals and nursing note reviewed.  Constitutional:      General: She is not in acute distress.    Appearance: Normal appearance. She is well-developed. She is not diaphoretic.  HENT:     Head: Normocephalic and atraumatic.     Mouth/Throat:     Pharynx: No oropharyngeal exudate.  Eyes:     Pupils: Pupils are equal, round, and reactive to light.  Neck:     Thyroid: No thyromegaly.     Vascular: No JVD.     Trachea: No tracheal deviation.     Comments: Carotid endarterectomy scar on right side of the neck.  Cardiovascular:     Rate and Rhythm: Normal rate and regular rhythm.     Pulses:          Dorsalis pedis pulses are 1+ on the right side and 1+ on the left side.       Posterior tibial pulses are 1+ on the right side and 1+ on the left side.     Heart sounds: Normal heart sounds. No murmur heard.  No friction rub. No gallop.   Pulmonary:     Effort: Pulmonary effort is normal. No respiratory distress.     Breath sounds: Normal breath sounds. No wheezing or rales.  Chest:     Chest wall: No tenderness.  Abdominal:     General: Bowel sounds are normal.     Palpations:  Abdomen is soft.     Tenderness: There is no abdominal tenderness.  Musculoskeletal:        General: Normal range of motion.     Cervical back: Normal range of motion and neck supple.     Right foot: Normal range of motion. No bunion.     Left foot: Normal range of motion. No bunion.  Feet:     Right foot:     Protective Sensation: 10 sites tested. 6 sites sensed.     Skin integrity: Skin integrity normal.     Toenail Condition: Right toenails are normal.     Left foot:     Protective Sensation: 10 sites tested. 6 sites sensed.     Skin integrity: Skin integrity normal.     Toenail Condition: Left toenails are normal.     Comments: Palpable neuromas on the plantar aspect of both feet.  Lymphadenopathy:     Cervical: No cervical adenopathy.  Skin:    General: Skin is warm and dry.  Neurological:     Mental Status: She is alert and oriented to person, place, and time. Mental status is at baseline.     Cranial Nerves: No cranial nerve deficit.  Psychiatric:        Attention and Perception: Attention and perception normal.        Mood and Affect: Affect normal. Mood is depressed.        Speech: Speech normal.        Behavior: Behavior normal. Behavior is cooperative.        Thought Content: Thought content normal.        Cognition and Memory: Cognition and memory normal.        Judgment: Judgment normal.    Depression screen [facility] 2/9 12/23/2019 07/28/2019 03/28/2019 12/20/2018 08/19/2018  Decreased Interest 0 3 1 3  0  Down, Depressed, Hopeless 0 - 0 3 0  PHQ - 2 Score 0 3 1 6  0  Altered sleeping - 3 - 3 -  Tired, decreased energy - 3 - 3 -  Change in appetite - 3 - 3 -  Feeling bad or failure about yourself  - 3 - 0 -  Trouble concentrating - 0 - 3 -  Moving slowly or fidgety/restless - 3 - 0 -  Suicidal thoughts - 0 - 0 -  PHQ-9 Score - 18 - 18 -  Difficult doing work/chores - - - Somewhat difficult -    Functional Status Survey: Is the patient deaf or have difficulty hearing?:  No Does the patient have difficulty seeing, even when wearing glasses/contacts?: Yes (it changes) Does the patient have difficulty concentrating, remembering, or making decisions?: No Does the patient have difficulty walking or climbing stairs?: Yes Does the patient have difficulty dressing or bathing?: No Does the patient have difficulty doing errands alone such as visiting a doctor's office or shopping?: No  MMSE - [place] Exam 12/23/2019 06/11/2017  Orientation to time 5 5  Orientation to Place 5 5  Registration 3 3  Attention/ Calculation 5 5  Recall 3 3  Language- name 2 objects 2 2  Language- repeat 1 1  Language- follow 3 step command 3 3  Language- read & follow direction 0 1  Write a sentence - 1  Copy design 1 1  Total score - 30    Fall Risk  12/23/2019 07/28/2019 03/28/2019 12/20/2018 08/19/2018  Falls in the past year? 1 1 1 1  0  Number falls in past yr: 1 1 1 1  -  Injury with Fall? 0 0 0  0 -  Follow up Falls evaluation completed - - - -      LABS: Recent Results (from the past 2160 hour(s))  POCT HgB A1C     Status: Abnormal   Collection Time: 12/23/19 11:51 AM  Result Value Ref Range   Hemoglobin A1C 12.5 (A) 4.0 - 5.6 %   HbA1c POC (<> result, manual entry)     HbA1c, POC (prediabetic range)     HbA1c, POC (controlled diabetic range)      Assessment/Plan: 1. Encounter for general adult medical examination with abnormal findings Annual health maintenance exam today. Order slip given for routine, fasting labs.   2. Type 2 diabetes mellitus with polyneuropathy (HCC) - POCT HgB A1C 12.5 today. Continue with basal insulin at 70 units daily. Will add mealtime coverage with regular insulin with sliding scale instructions provided. The patient was able to repeat instructions for this back to me. Will order dexcom continuous glucose monitor to ease frequent glucose monitoring.  - Continuous Blood Gluc Sensor (DEXCOM G6 SENSOR) MISC; Use as directed fto check  blood sugars frequently - DX E11.65  Dispense: 1 each; Refill: 5 - Continuous Blood Gluc Transmit (DEXCOM G6 TRANSMITTER) MISC; Apply [place] as directed to check blood sugars frequently - DX E11.65  Dispense: 1 each; Refill: 5 - Insulin Pen Needle (ADVOCATE INSULIN PEN NEEDLES) 33G X 4 MM MISC; To use with insulin dosing TID prior to meals and QHS for basal insulin.  Dispense: 300 each; Refill: 1 - insulin lispro (HUMALOG) 100 UNIT/ML injection; Tus use tid prior to meals according to sliding scale instructions. Max daily dose is 90 units per day. DX - E11.65  Dispense: 10 mL; Refill: 11  3. Essential hypertension Stable. Continue bp medication as prescribed   4. Irritable bowel syndrome with diarrhea She should continue routine visits with GI specialist to determine cause.   5. Inflammatory polyarthritis ([place]) May continue to take tramadol as needed and as prescribed.   6. Flu vaccine need Flu vaccine administered today.  - Flu Vaccine MDCK QUAD PF  7. Encounter for screening mammogram for malignant neoplasm of breast - MM DIGITAL SCREENING BILATERAL; Future  General Counseling: donella pascarella understanding of the findings of todays visit and agrees with plan of treatment. I have discussed any further diagnostic evaluation that may be needed or ordered today. We also reviewed her medications today. she has been encouraged to call the office with any questions or concerns that should arise related to todays visit.    Counseling:  Diabetes Counseling:  1. Addition of ACE inh/ ARB'S for nephroprotection. Microalbumin is updated  2. Diabetic foot care, prevention of complications. Podiatry consult 3. Exercise and lose weight.  4. Diabetic eye examination, Diabetic eye exam is updated  5. Monitor blood sugar closlely. nutrition counseling.  6. Sign and symptoms of hypoglycemia including shaking sweating,confusion and headaches.  This patient was seen by Leretha Pol FNP Collaboration  with Dr Lavera Guise as a part of collaborative care agreement  Orders Placed This Encounter  Procedures  . MM DIGITAL SCREENING BILATERAL  . Flu Vaccine MDCK QUAD PF  . POCT HgB A1C    Meds ordered this encounter  Medications  . Continuous Blood Gluc Sensor (DEXCOM G6 SENSOR) MISC    Sig: Use as directed fto check blood sugars frequently - DX E11.65    Dispense:  1 each    Refill:  5    Started patient on mealtime regular insulin coverage. Checking sugars  four times daily.    Order Specific Question:   Supervising Provider    Answer:   Lavera Guise [[id number]]  . Continuous Blood Gluc Transmit (DEXCOM G6 TRANSMITTER) MISC    Sig: Apply [place] as directed to check blood sugars frequently - DX E11.65    Dispense:  1 each    Refill:  5    Started patient on mealtime regular insulin coverage. Checking sugars four times daily.    Order Specific Question:   Supervising Provider    Answer:   Lavera Guise [[id number]]  . Insulin Pen Needle (ADVOCATE INSULIN PEN NEEDLES) 33G X 4 MM MISC    Sig: To use with insulin dosing TID prior to meals and QHS for basal insulin.    Dispense:  300 each    Refill:  1    Order Specific Question:   Supervising Provider    Answer:   Lavera Guise [place]  . insulin lispro (HUMALOG) 100 UNIT/ML injection    Sig: [place] use tid prior to meals according to sliding scale instructions. Max daily dose is 90 units per day. DX - E11.65    Dispense:  10 mL    Refill:  11    Order Specific Question:   Supervising Provider    Answer:   Lavera Guise [[id number]]    Total time spent: 61 Minutes  Time spent includes review of chart, medications, test results, and follow up plan with the patient.     Lavera Guise, MD  Internal Medicine

## 2019-12-26 ENCOUNTER — Telehealth: Payer: Self-pay

## 2019-12-26 ENCOUNTER — Other Ambulatory Visit: Payer: Self-pay

## 2019-12-26 DIAGNOSIS — M7540 Impingement syndrome of unspecified shoulder: Secondary | ICD-10-CM

## 2019-12-26 MED ORDER — TIZANIDINE HCL 4 MG PO TABS: ORAL_TABLET | ORAL | 2 refills | Status: DC

## 2019-12-27 ENCOUNTER — Other Ambulatory Visit: Payer: Self-pay | Admitting: Nurse Practitioner

## 2019-12-27 DIAGNOSIS — M064 Inflammatory polyarthropathy: Secondary | ICD-10-CM

## 2019-12-27 MED ORDER — TRAMADOL HCL 50 MG PO TABS: 50.0000 mg | ORAL_TABLET | Freq: Three times a day (TID) | ORAL | 0 refills | Status: DC | PRN

## 2019-12-27 NOTE — Telephone Encounter (Signed)
Ok. I took care of this and sent to [facility] [place]

## 2019-12-28 ENCOUNTER — Other Ambulatory Visit: Payer: Self-pay

## 2019-12-29 ENCOUNTER — Telehealth: Payer: Self-pay

## 2019-12-29 NOTE — Telephone Encounter (Signed)
Faxed dexacom note

## 2020-01-06 ENCOUNTER — Other Ambulatory Visit: Payer: Self-pay

## 2020-01-06 DIAGNOSIS — E1165 Type 2 diabetes mellitus with hyperglycemia: Secondary | ICD-10-CM

## 2020-01-06 MED ORDER — TRESIBA FLEXTOUCH 200 UNIT/ML ~~LOC~~ SOPN
70.0000 [IU] | PEN_INJECTOR | Freq: Every day | SUBCUTANEOUS | 1 refills | Status: DC
Start: 2020-01-06 — End: 2020-05-28

## 2020-01-06 MED ORDER — TRESIBA FLEXTOUCH 100 UNIT/ML ~~LOC~~ SOPN: PEN_INJECTOR | SUBCUTANEOUS | 5 refills | Status: DC

## 2020-01-06 NOTE — Telephone Encounter (Signed)
Due to tresiba 100 ml is not available as per heather send tresiba 200  70 units daily

## 2020-01-23 ENCOUNTER — Ambulatory Visit: Payer: Medicare Other | Admitting: Nurse Practitioner

## 2020-01-23 ENCOUNTER — Other Ambulatory Visit: Payer: Self-pay

## 2020-01-23 DIAGNOSIS — E1142 Type 2 diabetes mellitus with diabetic polyneuropathy: Secondary | ICD-10-CM

## 2020-01-23 MED ORDER — ADVOCATE INSULIN PEN NEEDLES 33G X 4 MM MISC: 1 refills | Status: DC

## 2020-01-27 ENCOUNTER — Ambulatory Visit: Payer: Medicare Other | Admitting: Nurse Practitioner

## 2020-01-30 ENCOUNTER — Other Ambulatory Visit: Payer: Self-pay

## 2020-01-30 ENCOUNTER — Other Ambulatory Visit: Payer: Self-pay | Admitting: Nurse Practitioner

## 2020-01-30 ENCOUNTER — Other Ambulatory Visit: Payer: Self-pay | Admitting: Internal Medicine

## 2020-01-30 ENCOUNTER — Telehealth: Payer: Self-pay

## 2020-01-30 DIAGNOSIS — M7540 Impingement syndrome of unspecified shoulder: Secondary | ICD-10-CM

## 2020-01-30 DIAGNOSIS — E1142 Type 2 diabetes mellitus with diabetic polyneuropathy: Secondary | ICD-10-CM

## 2020-01-30 DIAGNOSIS — M064 Inflammatory polyarthropathy: Secondary | ICD-10-CM

## 2020-01-30 MED ORDER — TIZANIDINE HCL 4 MG PO TABS: ORAL_TABLET | ORAL | 0 refills | Status: DC

## 2020-01-30 NOTE — Telephone Encounter (Signed)
Pt need appt for refills

## 2020-01-30 NOTE — Telephone Encounter (Signed)
Spoke with pt that need follow in 4 weeks and change Humalog to novolog as per dfk 42 units maximum day

## 2020-01-30 NOTE — Telephone Encounter (Signed)
She was supposed to make a 4 week follow up from her last visit.

## 2020-01-30 NOTE — Telephone Encounter (Signed)
Can you find out how much is she using

## 2020-02-03 ENCOUNTER — Encounter: Payer: Self-pay | Admitting: Nurse Practitioner

## 2020-02-03 ENCOUNTER — Ambulatory Visit (INDEPENDENT_AMBULATORY_CARE_PROVIDER_SITE_OTHER): Payer: Medicare Other | Admitting: Nurse Practitioner

## 2020-02-03 ENCOUNTER — Other Ambulatory Visit: Payer: Self-pay

## 2020-02-03 VITALS — BP 152/90 | HR 75 | Temp 97.1°F | Resp 16 | Ht 67.0 in | Wt 178.4 lb

## 2020-02-03 DIAGNOSIS — E1165 Type 2 diabetes mellitus with hyperglycemia: Secondary | ICD-10-CM | POA: Diagnosis not present

## 2020-02-03 DIAGNOSIS — R112 Nausea with vomiting, unspecified: Secondary | ICD-10-CM | POA: Diagnosis not present

## 2020-02-03 DIAGNOSIS — M064 Inflammatory polyarthropathy: Secondary | ICD-10-CM

## 2020-02-03 DIAGNOSIS — Z794 Long term (current) use of insulin: Secondary | ICD-10-CM | POA: Diagnosis not present

## 2020-02-03 DIAGNOSIS — R197 Diarrhea, unspecified: Secondary | ICD-10-CM

## 2020-02-03 DIAGNOSIS — K529 Noninfective gastroenteritis and colitis, unspecified: Secondary | ICD-10-CM

## 2020-02-03 MED ORDER — ONDANSETRON 8 MG PO TBDP: 8.0000 mg | ORAL_TABLET | Freq: Three times a day (TID) | ORAL | 2 refills | Status: DC | PRN

## 2020-02-03 MED ORDER — TRAMADOL HCL 50 MG PO TABS: 50.0000 mg | ORAL_TABLET | Freq: Three times a day (TID) | ORAL | 1 refills | Status: DC | PRN

## 2020-02-03 MED ORDER — DIPHENOXYLATE-ATROPINE 2.5-0.025 MG PO TABS: 1.0000 | ORAL_TABLET | Freq: Three times a day (TID) | ORAL | 0 refills | Status: DC | PRN

## 2020-02-03 MED ORDER — METRONIDAZOLE 500 MG PO TABS: 500.0000 mg | ORAL_TABLET | Freq: Two times a day (BID) | ORAL | 0 refills | Status: DC

## 2020-02-03 MED ORDER — LEVOFLOXACIN 500 MG PO TABS: 500.0000 mg | ORAL_TABLET | Freq: Every day | ORAL | 0 refills | Status: DC

## 2020-02-03 NOTE — Progress Notes (Signed)
[facility] [place], [place] [address]  Internal MEDICINE  Office Visit Note  Patient Name: Morgan Daniels  [id number]  [id number]  Date of Service: 03/06/2020   Pt is here for a sick visit.  Chief Complaint  Patient presents with  . Acute Visit    Stomach flu, gas, bloating, diarrhea  . Diabetes  . Depression  . Anxiety  . Hypertension  . Gastroesophageal Reflux     The patient presents for acute visit. She states that she started having diarrhea ad severe gas about 2 weeks ago. States that gas is so severe that she is taking OTC gas pills about every two hours. She states that she already had three episodes of diarrhea this morning. Has ben only able to tolerate a soft diet. She doe have a GI provider that she sees. She has been unable to get appointment with them until after the holidays. She did have a few days, when this first started, when she had nausea and vomiting. During those days she also had low grade fever. She also has intermittent intestinal cramping. She has had bouts of this in the past. Most recent colonoscopy was done 11/19/2018.  She had two small polyps which were removed, diverticulosis, and non-bleeding hemorrhoids. She does see a GI provider, but has been unable to get in to the office in reasonable amount of time.        Current Medication:  Outpatient Encounter Medications as of 02/03/2020  Medication Sig Note  . Cholecalciferol (VITAMIN D-3) 5000 UNITS TABS Take 5,000 Units by mouth daily.    . Continuous Blood Gluc Receiver (DEXCOM G6 RECEIVER) DEVI Use as directed fto check blood sugars frequently - DX E11.65   . Continuous Blood Gluc Sensor (DEXCOM G6 SENSOR) MISC Use as directed fto check blood sugars frequently - DX E11.65   . Continuous Blood Gluc Transmit (DEXCOM G6 TRANSMITTER) MISC Apply [place] as directed to check blood sugars frequently - DX E11.65   . fluconazole (DIFLUCAN) 150 MG tablet Take 1 tab by mouth once. May  repeat in 3 days for persistent symptoms.   [name] FLUoxetine (PROZAC) 40 MG capsule Take 2 capsules (80 mg total) by mouth daily.   [name] gabapentin (NEURONTIN) 600 MG tablet TAKE 1 TABLET BY MOUTH THREE TIMES A DAY.   . hydrochlorothiazide (HYDRODIURIL) 12.5 MG tablet TAKE 1 TABLET BY MOUTH DAILY FOR BLOOD PRESSURE/EDEMA   . insulin aspart (NOVOLOG FLEXPEN) 100 UNIT/ML FlexPen Use as directed sliding scale with each meals maximum 42 units no more than 42 units a day   . insulin degludec (TRESIBA FLEXTOUCH) 200 UNIT/ML FlexTouch Pen Inject 70 Units into the skin daily.   . Insulin Pen Needle (ADVOCATE INSULIN PEN NEEDLES) 33G X 4 MM MISC To use with insulin dosing TID prior to meals and QHS for basal insulin.   . metoprolol succinate (TOPROL-XL) 50 MG 24 hr tablet Take 1 tablet (50 mg total) by mouth daily. Take with or immediately following a meal.   . omeprazole (PRILOSEC) 20 MG capsule Take 20 mg by mouth daily as needed.   . ondansetron (ZOFRAN ODT) 8 MG disintegrating tablet Take 1 tablet (8 mg total) by mouth every 8 (eight) hours as needed for nausea.   Glory Rosebush DELICA LANCETS FINE MISC CHECK BLOOD SUGAR THREE TIMES A DAY 11/09/2014: Received from: External Pharmacy  . ONETOUCH VERIO test strip CHECK BLOOD SUGAR THREE TIMES A DAY 11/09/2014: Received from: External Pharmacy  .  Pancrelipase, Lip-Prot-Amyl, (ZENPEP) 40000-126000 units CPEP Take 1 capsule by mouth with breakfast, with lunch, and with evening meal.   . tiZANidine (ZANAFLEX) 4 MG tablet TAKE 1 TABLET(S) BY MOUTH TWICE A DAY AS NEEDED FOR MUSCLE PAIN AND SPASMS   . [DISCONTINUED] ondansetron (ZOFRAN ODT) 8 MG disintegrating tablet Take 1 tablet (8 mg total) by mouth every 8 (eight) hours as needed for nausea.   . [DISCONTINUED] traMADol (ULTRAM) 50 MG tablet Take 1 tablet (50 mg total) by mouth 3 (three) times daily as needed for moderate pain.   . diphenoxylate-atropine (LOMOTIL) 2.5-0.025 MG tablet Take 1 tablet by mouth 3 (three) times  daily as needed for diarrhea or loose stools.   [name] levofloxacin (LEVAQUIN) 500 MG tablet Take 1 tablet (500 mg total) by mouth daily.   . metroNIDAZOLE (FLAGYL) 500 MG tablet Take 1 tablet (500 mg total) by mouth 2 (two) times daily.   . traMADol (ULTRAM) 50 MG tablet Take 1 tablet (50 mg total) by mouth 3 (three) times daily as needed for moderate pain.    No facility-administered encounter medications on file as of 02/03/2020.      Medical History: Past Medical History:  Diagnosis Date  . Anxiety   . Arthritis   . Carotid artery occlusion   . Chronic kidney disease May 2017   UTI  . Depression   . Diabetes ([place])   . Diverticulosis   . Fatty liver   . Fibromyalgia   . GERD (gastroesophageal reflux disease)   . H/O hiatal hernia   . Hypertension   . IBS (irritable bowel syndrome)   . Peptic ulcer   . Plantar fasciitis   . Stroke [facility]) Dec. 14,2013   Right side-ministroke     Today's Vitals   02/03/20 0918  BP: (!) 152/90  Pulse: 75  Resp: 16  Temp: (!) 97.1 F (36.2 C)  SpO2: 97%  Weight: 178 lb 6.4 oz (80.9 kg)  Height: 5\' 7"  (1.702 m)   Body mass index is 27.94 kg/m.  Review of Systems  Constitutional: Positive for activity change. Negative for chills, fatigue and unexpected weight change.       Having to schedule activities around unpredictable bowel pattern.   HENT: Negative for congestion, postnasal drip, rhinorrhea, sneezing and sore throat.   Respiratory: Negative for cough, chest tightness, shortness of breath and wheezing.   Cardiovascular: Negative for chest pain and palpitations.       Blood pressure mildly elevated today.   Gastrointestinal: Positive for abdominal pain, diarrhea and nausea. Negative for constipation and vomiting.  Endocrine: Negative for cold intolerance, heat intolerance, polydipsia and polyuria.       Blood sugars running high.   Musculoskeletal: Negative for arthralgias, back pain, joint swelling and neck pain.  Skin: Negative  for rash.  Allergic/Immunologic: Positive for environmental allergies.  Neurological: Negative for dizziness, tremors, numbness and headaches.  Hematological: Negative for adenopathy. Does not bruise/bleed easily.  Psychiatric/Behavioral: Positive for dysphoric mood. Negative for behavioral problems (Depression), sleep disturbance and suicidal ideas. The patient is nervous/anxious.     Physical Exam Vitals and nursing note reviewed.  Constitutional:      General: She is not in acute distress.    Appearance: Normal appearance. She is well-developed and well-nourished. She is ill-appearing. She is not diaphoretic.  HENT:     Head: Normocephalic and atraumatic.     Mouth/Throat:     Mouth: Oropharynx is clear and moist.     Pharynx: No  oropharyngeal exudate.  Eyes:     Extraocular Movements: EOM normal.     Pupils: Pupils are equal, round, and reactive to light.  Neck:     Thyroid: No thyromegaly.     Vascular: No JVD.     Trachea: No tracheal deviation.  Cardiovascular:     Rate and Rhythm: Normal rate and regular rhythm.     Heart sounds: Normal heart sounds. No murmur heard. No friction rub. No gallop.   Pulmonary:     Effort: Pulmonary effort is normal. No respiratory distress.     Breath sounds: Normal breath sounds. No wheezing or rales.  Chest:     Chest wall: No tenderness.  Abdominal:     General: Bowel sounds are normal.     Palpations: Abdomen is soft.     Tenderness: There is abdominal tenderness.  Musculoskeletal:        General: Normal range of motion.     Cervical back: Normal range of motion and neck supple.  Lymphadenopathy:     Cervical: No cervical adenopathy.  Skin:    General: Skin is warm and dry.  Neurological:     General: No focal deficit present.     Mental Status: She is alert and oriented to person, place, and time.     Cranial Nerves: No cranial nerve deficit.  Psychiatric:        Mood and Affect: Mood and affect normal.        Behavior:  Behavior normal.        Thought Content: Thought content normal.        Judgment: Judgment normal.    Assessment/Plan: 1. Acute gastroenteritis Start combination therapy of metronidazole 500mg  twice daily and levofloxacin daily for next 10 days. Rest and increase fluids. Recommend bland diet. Advance as tolerated.  Recommended she follow up with her GI provider as soon as possible.  - metroNIDAZOLE (FLAGYL) 500 MG tablet; Take 1 tablet (500 mg total) by mouth 2 (two) times daily.  Dispense: 20 tablet; Refill: 0 - levofloxacin (LEVAQUIN) 500 MG tablet; Take 1 tablet (500 mg total) by mouth daily.  Dispense: 10 tablet; Refill: 0  2. Diarrhea, unspecified type Start combination therapy of metronidazole 500mg  twice daily and levofloxacin daily for next 10 days. Rest and increase fluids. Recommend bland diet. Advance as tolerated.  May take lomotil up to three times daily as needed for diarrhea.  - metroNIDAZOLE (FLAGYL) 500 MG tablet; Take 1 tablet (500 mg total) by mouth 2 (two) times daily.  Dispense: 20 tablet; Refill: 0 - levofloxacin (LEVAQUIN) 500 MG tablet; Take 1 tablet (500 mg total) by mouth daily.  Dispense: 10 tablet; Refill: 0 - diphenoxylate-atropine (LOMOTIL) 2.5-0.025 MG tablet; Take 1 tablet by mouth 3 (three) times daily as needed for diarrhea or loose stools.  Dispense: 30 tablet; Refill: 0  3. Non-intractable vomiting with nausea, unspecified vomiting type May take zofran 8mg  up to three times daily if needed for nausea/vomiting. BRAT and bland diet recommended. Advance as tolerated.  - ondansetron (ZOFRAN ODT) 8 MG disintegrating tablet; Take 1 tablet (8 mg total) by mouth every 8 (eight) hours as needed for nausea.  Dispense: 30 tablet; Refill: 2  4. Inflammatory polyarthritis ([place]) May continue to take tramadol 50mg  up to three times daily as needed for pain. New prescription sent to her pharmacy today.  - traMADol (ULTRAM) 50 MG tablet; Take 1 tablet (50 mg total) by mouth  3 (three) times daily as needed for moderate  pain.  Dispense: 90 tablet; Refill: 1  5. Type 2 diabetes mellitus with hyperglycemia, with long-term current use of insulin ([place]) Continue diabetic medication as prescribed .  General Counseling: naiya corral understanding of the findings of todays visit and agrees with plan of treatment. I have discussed any further diagnostic evaluation that may be needed or ordered today. We also reviewed her medications today. she has been encouraged to call the office with any questions or concerns that should arise related to todays visit.    Counseling:   The 'BRAT' diet is suggested, then progress to diet as tolerated as symptoms abate. Call if bloody stools, persistent diarrhea, vomiting, fever or abdominal pain.  This patient was seen by [place] with Dr Lavera Guise as a part of collaborative care agreement  Meds ordered this encounter  Medications  . metroNIDAZOLE (FLAGYL) 500 MG tablet    Sig: Take 1 tablet (500 mg total) by mouth 2 (two) times daily.    Dispense:  20 tablet    Refill:  0    Order Specific Question:   Supervising Provider    Answer:   Lavera Guise [[id number]]  . levofloxacin (LEVAQUIN) 500 MG tablet    Sig: Take 1 tablet (500 mg total) by mouth daily.    Dispense:  10 tablet    Refill:  0    Order Specific Question:   Supervising Provider    Answer:   Lavera Guise [[id number]]  . diphenoxylate-atropine (LOMOTIL) 2.5-0.025 MG tablet    Sig: Take 1 tablet by mouth 3 (three) times daily as needed for diarrhea or loose stools.    Dispense:  30 tablet    Refill:  0    Order Specific Question:   Supervising Provider    Answer:   Lavera Guise [[id number]]  . ondansetron (ZOFRAN ODT) 8 MG disintegrating tablet    Sig: Take 1 tablet (8 mg total) by mouth every 8 (eight) hours as needed for nausea.    Dispense:  30 tablet    Refill:  2    Order Specific Question:   Supervising Provider    Answer:   Lavera Guise  [[id number]]  . traMADol (ULTRAM) 50 MG tablet    Sig: Take 1 tablet (50 mg total) by mouth 3 (three) times daily as needed for moderate pain.    Dispense:  90 tablet    Refill:  1    Confirmed with patient no allergy to tramadol    Order Specific Question:   Supervising Provider    Answer:   Lavera Guise [[id number]]    Time spent: 30 Minutes

## 2020-02-28 ENCOUNTER — Ambulatory Visit: Payer: Medicare Other | Admitting: Nurse Practitioner

## 2020-02-29 ENCOUNTER — Telehealth: Payer: Self-pay

## 2020-02-29 NOTE — Telephone Encounter (Signed)
LMOM to reschedule appt. From 02-28-20

## 2020-03-02 ENCOUNTER — Telehealth: Payer: Self-pay

## 2020-03-02 NOTE — Telephone Encounter (Signed)
lmom to call and rs missed ov from 02-28-20. Loma Sousa

## 2020-03-06 DIAGNOSIS — R197 Diarrhea, unspecified: Secondary | ICD-10-CM | POA: Insufficient documentation

## 2020-03-19 ENCOUNTER — Other Ambulatory Visit: Payer: Self-pay

## 2020-03-19 DIAGNOSIS — E1142 Type 2 diabetes mellitus with diabetic polyneuropathy: Secondary | ICD-10-CM

## 2020-03-19 MED ORDER — NOVOLOG FLEXPEN 100 UNIT/ML ~~LOC~~ SOPN: PEN_INJECTOR | SUBCUTANEOUS | 0 refills | Status: DC

## 2020-03-29 DIAGNOSIS — Z23 Encounter for immunization: Secondary | ICD-10-CM | POA: Diagnosis not present

## 2020-04-22 ENCOUNTER — Other Ambulatory Visit: Payer: Self-pay | Admitting: Internal Medicine

## 2020-04-22 DIAGNOSIS — E1142 Type 2 diabetes mellitus with diabetic polyneuropathy: Secondary | ICD-10-CM

## 2020-04-22 NOTE — Telephone Encounter (Signed)
Needs follow up in April for hg a1c

## 2020-05-02 ENCOUNTER — Other Ambulatory Visit: Payer: Self-pay | Admitting: Internal Medicine

## 2020-05-02 ENCOUNTER — Other Ambulatory Visit: Payer: Self-pay

## 2020-05-02 ENCOUNTER — Ambulatory Visit: Payer: Medicare Other | Admitting: Hospice and Palliative Medicine

## 2020-05-02 ENCOUNTER — Encounter: Payer: Self-pay | Admitting: Hospice and Palliative Medicine

## 2020-05-02 ENCOUNTER — Other Ambulatory Visit: Payer: Self-pay | Admitting: Nurse Practitioner

## 2020-05-02 VITALS — BP 138/74 | HR 94 | Temp 97.1°F | Resp 16 | Ht 67.0 in | Wt 191.4 lb

## 2020-05-02 DIAGNOSIS — R5383 Other fatigue: Secondary | ICD-10-CM | POA: Diagnosis not present

## 2020-05-02 DIAGNOSIS — E1142 Type 2 diabetes mellitus with diabetic polyneuropathy: Secondary | ICD-10-CM

## 2020-05-02 DIAGNOSIS — I10 Essential (primary) hypertension: Secondary | ICD-10-CM

## 2020-05-02 DIAGNOSIS — F321 Major depressive disorder, single episode, moderate: Secondary | ICD-10-CM

## 2020-05-02 DIAGNOSIS — Z794 Long term (current) use of insulin: Secondary | ICD-10-CM

## 2020-05-02 DIAGNOSIS — E1140 Type 2 diabetes mellitus with diabetic neuropathy, unspecified: Secondary | ICD-10-CM | POA: Diagnosis not present

## 2020-05-02 DIAGNOSIS — E1165 Type 2 diabetes mellitus with hyperglycemia: Secondary | ICD-10-CM | POA: Diagnosis not present

## 2020-05-02 DIAGNOSIS — M064 Inflammatory polyarthropathy: Secondary | ICD-10-CM | POA: Diagnosis not present

## 2020-05-02 MED ORDER — METOPROLOL SUCCINATE ER 50 MG PO TB24
50.0000 mg | ORAL_TABLET | Freq: Every day | ORAL | 5 refills | Status: DC
Start: 2020-05-02 — End: 2020-08-28

## 2020-05-02 MED ORDER — FLUOXETINE HCL 40 MG PO CAPS: 80.0000 mg | ORAL_CAPSULE | Freq: Every day | ORAL | 0 refills | Status: DC

## 2020-05-02 MED ORDER — HYDROCHLOROTHIAZIDE 12.5 MG PO TABS
ORAL_TABLET | ORAL | 5 refills | Status: DC
Start: 2020-05-02 — End: 2020-08-02

## 2020-05-02 MED ORDER — TRAMADOL HCL 50 MG PO TABS: 50.0000 mg | ORAL_TABLET | Freq: Three times a day (TID) | ORAL | 1 refills | Status: DC | PRN

## 2020-05-02 MED ORDER — GABAPENTIN 600 MG PO TABS: ORAL_TABLET | ORAL | 2 refills | Status: DC

## 2020-05-02 NOTE — Progress Notes (Signed)
[facility] [place], [place] [address]  Internal MEDICINE  Office Visit Note  Patient Name: Morgan Daniels  [id number]  [id number]  Date of Service: 05/04/2020  Chief Complaint  Patient presents with  . Acute Visit    Refills, pain in arms started about 2 weeks ago and getting worse     HPI Patient is here for routine follow-up C/o vague pain in arms, legs and generalized Muscles feel weak--overall she feels poor  Has not been in for follow-up since November A1C screening due Glucose levels at home remain uncontrolled, forgets meals and will forget to take sliding scale insulin as prescribed Currently taking 80 units of Tresiba daily along with sliding scale insulin Frustrated about her glucose levels and that they remain uncontrolled  Requesting refills on her medications   Current Medication: Outpatient Encounter Medications as of 05/02/2020  Medication Sig Note  . Cholecalciferol (VITAMIN D-3) 5000 UNITS TABS Take 5,000 Units by mouth daily.    . Continuous Blood Gluc Receiver (DEXCOM G6 RECEIVER) DEVI Use as directed fto check blood sugars frequently - DX E11.65   . Continuous Blood Gluc Sensor (DEXCOM G6 SENSOR) MISC Use as directed fto check blood sugars frequently - DX E11.65   . Continuous Blood Gluc Transmit (DEXCOM G6 TRANSMITTER) MISC Apply [place] as directed to check blood sugars frequently - DX E11.65   . diphenoxylate-atropine (LOMOTIL) 2.5-0.025 MG tablet Take 1 tablet by mouth 3 (three) times daily as needed for diarrhea or loose stools.   [name] FLUoxetine (PROZAC) 40 MG capsule Take 2 capsules (80 mg total) by mouth daily.   [name] gabapentin (NEURONTIN) 600 MG tablet TAKE 1 TABLET BY MOUTH THREE TIMES A DAY.   . hydrochlorothiazide (HYDRODIURIL) 12.5 MG tablet TAKE 1 TABLET BY MOUTH DAILY FOR BLOOD PRESSURE/EDEMA   . insulin degludec (TRESIBA FLEXTOUCH) 200 UNIT/ML FlexTouch Pen Inject 70 Units into the skin daily.   . Insulin Pen Needle  (ADVOCATE INSULIN PEN NEEDLES) 33G X 4 MM MISC To use with insulin dosing TID prior to meals and QHS for basal insulin.   . metoprolol succinate (TOPROL-XL) 50 MG 24 hr tablet Take 1 tablet (50 mg total) by mouth daily. Take with or immediately following a meal.   . NOVOLOG FLEXPEN 100 UNIT/ML FlexPen USE AS DIRECTED SLIDING SCALE WITH EACH MEALS MAXIMUM 42 UNITS NO MORE THAN 42 UNITS A DAY   . omeprazole (PRILOSEC) 20 MG capsule Take 20 mg by mouth daily as needed.   . ondansetron (ZOFRAN ODT) 8 MG disintegrating tablet Take 1 tablet (8 mg total) by mouth every 8 (eight) hours as needed for nausea.   Glory Rosebush DELICA LANCETS FINE MISC CHECK BLOOD SUGAR THREE TIMES A DAY 11/09/2014: Received from: External Pharmacy  . ONETOUCH VERIO test strip CHECK BLOOD SUGAR THREE TIMES A DAY 11/09/2014: Received from: External Pharmacy  . Pancrelipase, Lip-Prot-Amyl, (ZENPEP) 40000-126000 units CPEP Take 1 capsule by mouth with breakfast, with lunch, and with evening meal.   . tiZANidine (ZANAFLEX) 4 MG tablet TAKE 1 TABLET(S) BY MOUTH TWICE A DAY AS NEEDED FOR MUSCLE PAIN AND SPASMS   . traMADol (ULTRAM) 50 MG tablet Take 1 tablet (50 mg total) by mouth 3 (three) times daily as needed for moderate pain.   . [DISCONTINUED] fluconazole (DIFLUCAN) 150 MG tablet Take 1 tab by mouth once. May repeat in 3 days for persistent symptoms.   . [DISCONTINUED] FLUoxetine (PROZAC) 40 MG capsule Take 2 capsules (80 mg total)  by mouth daily.   . [DISCONTINUED] gabapentin (NEURONTIN) 600 MG tablet TAKE 1 TABLET BY MOUTH THREE TIMES A DAY.   . [DISCONTINUED] hydrochlorothiazide (HYDRODIURIL) 12.5 MG tablet TAKE 1 TABLET BY MOUTH DAILY FOR BLOOD PRESSURE/EDEMA   . [DISCONTINUED] levofloxacin (LEVAQUIN) 500 MG tablet Take 1 tablet (500 mg total) by mouth daily.   . [DISCONTINUED] metoprolol succinate (TOPROL-XL) 50 MG 24 hr tablet Take 1 tablet (50 mg total) by mouth daily. Take with or immediately following a meal.   .  [DISCONTINUED] metroNIDAZOLE (FLAGYL) 500 MG tablet Take 1 tablet (500 mg total) by mouth 2 (two) times daily.   . [DISCONTINUED] traMADol (ULTRAM) 50 MG tablet Take 1 tablet (50 mg total) by mouth 3 (three) times daily as needed for moderate pain.    No facility-administered encounter medications on file as of 05/02/2020.    Surgical History: Past Surgical History:  Procedure Laterality Date  . ABDOMINAL HYSTERECTOMY  1990  . BACK SURGERY  2000, 2004  . CAROTID ENDARTERECTOMY Left 05/06/12  . CARPAL TUNNEL RELEASE Left 07/18/2015   Procedure: CARPAL TUNNEL RELEASE;  Surgeon: Earnestine Leys, MD;  Location: [facility] ORS;  Service: Orthopedics;  Laterality: Left;  . CEREBRAL ANGIOGRAM Bilateral 05/03/2012   Procedure: CEREBRAL ANGIOGRAM;  Surgeon: Angelia Mould, MD;  Location: [facility] CATH LAB;  Service: Cardiovascular;  Laterality: Bilateral;  . CHOLECYSTECTOMY  2001  . COLONOSCOPY WITH PROPOFOL N/A 11/19/2018   Procedure: COLONOSCOPY WITH PROPOFOL;  Surgeon: Lucilla Lame, MD;  Location: [place];  Service: Endoscopy;  Laterality: N/A;  Diabetic - insulin  . ENDARTERECTOMY Left 05/06/2012   Procedure: ENDARTERECTOMY CAROTID;  Surgeon: Angelia Mould, MD;  Location: [place];  Service: Vascular;  Laterality: Left;  . ENDARTERECTOMY Right 08/09/2013   Procedure: ENDARTERECTOMY CAROTID-RIGHT;  Surgeon: Angelia Mould, MD;  Location: [place];  Service: Vascular;  Laterality: Right;  . HERNIA REPAIR    . PATCH ANGIOPLASTY Left 05/06/2012   Procedure: WITH DACRON PATCH ANGIOPLASTY ;  Surgeon: Angelia Mould, MD;  Location: [place];  Service: Vascular;  Laterality: Left;  . POLYPECTOMY  11/19/2018   Procedure: POLYPECTOMY;  Surgeon: Lucilla Lame, MD;  Location: [place];  Service: Endoscopy;;  . [place]  2004  . TONSILLECTOMY    . TUBAL LIGATION      Medical History: Past Medical History:  Diagnosis Date  . Anxiety   . Arthritis   . Carotid artery  occlusion   . Chronic kidney disease May 2017   UTI  . Depression   . Diabetes ([place])   . Diverticulosis   . Fatty liver   . Fibromyalgia   . GERD (gastroesophageal reflux disease)   . H/O hiatal hernia   . Hypertension   . IBS (irritable bowel syndrome)   . Peptic ulcer   . Plantar fasciitis   . Stroke [facility]) Dec. 14,2013   Right side-ministroke    Family History: Family History  Problem Relation Age of Onset  . Hypertension Mother   . Hyperlipidemia Mother   . Deep vein thrombosis Mother   . Cancer Father   . Alcohol abuse Father   . Hypertension Maternal Grandmother   . Deep vein thrombosis Sister   . Alcohol abuse Sister   . Alcohol abuse Sister     Social History   Socioeconomic History  . Marital status: Married    Spouse name: Not on file  . Number of children: Not on file  . Years of education:  Not on file  . Highest education level: Not on file  Occupational History  . Not on file  Tobacco Use  . Smoking status: Current Every Day Smoker    Packs/day: 1.00    Years: 40.00    Pack years: 40.00    Types: Cigarettes    Start date: 11/09/1970  . Smokeless tobacco: Never Used  . Tobacco comment:    Vaping Use  . Vaping Use: Former  Substance and Sexual Activity  . Alcohol use: Yes    Alcohol/week: 0.0 standard drinks    Comment: rarely  . Drug use: No  . Sexual activity: Not Currently    Partners: Male  Other Topics Concern  . Not on file  Social History Narrative  . Not on file   Social Determinants of Health   Financial Resource Strain: Not on file  Food Insecurity: Not on file  Transportation Needs: Not on file  Physical Activity: Not on file  Stress: Not on file  Social Connections: Not on file  Intimate Partner Violence: Not on file      Review of Systems  Constitutional: Positive for fatigue. Negative for chills and diaphoresis.  HENT: Negative for ear pain, postnasal drip and sinus pressure.   Eyes: Negative for photophobia,  discharge, redness, itching and visual disturbance.  Respiratory: Negative for cough, shortness of breath and wheezing.   Cardiovascular: Negative for chest pain, palpitations and leg swelling.  Gastrointestinal: Negative for abdominal pain, constipation, diarrhea, nausea and vomiting.  Genitourinary: Negative for dysuria and flank pain.  Musculoskeletal: Positive for arthralgias and myalgias. Negative for back pain, gait problem and neck pain.  Skin: Negative for color change.  Allergic/Immunologic: Negative for environmental allergies and food allergies.  Neurological: Positive for weakness. Negative for dizziness and headaches.  Hematological: Does not bruise/bleed easily.  Psychiatric/Behavioral: Negative for agitation, behavioral problems (depression) and hallucinations.    Vital Signs: BP 138/74   Pulse 94   Temp (!) 97.1 F (36.2 C)   Resp 16   Ht 5\' 7"  (1.702 m)   Wt 191 lb 6.4 oz (86.8 kg)   SpO2 98%   BMI 29.98 kg/m    Physical Exam Vitals reviewed.  Constitutional:      Appearance: Normal appearance. She is normal weight.  Cardiovascular:     Rate and Rhythm: Normal rate and regular rhythm.     Pulses: Normal pulses.     Heart sounds: Normal heart sounds.  Pulmonary:     Effort: Pulmonary effort is normal.     Breath sounds: Normal breath sounds.  Abdominal:     General: Abdomen is flat.     Palpations: Abdomen is soft.  Musculoskeletal:        General: Normal range of motion.     Cervical back: Normal range of motion.  Skin:    General: Skin is warm.  Neurological:     General: No focal deficit present.     Mental Status: She is alert and oriented to person, place, and time. Mental status is at baseline.  Psychiatric:        Mood and Affect: Mood normal.        Behavior: Behavior normal.        Thought Content: Thought content normal.        Judgment: Judgment normal.    Assessment/Plan: 1. Type 2 diabetes mellitus with diabetic neuropathy, with  long-term current use of insulin (HCC) A1C 10.6, referral to endocrinology for further management and possible  insulin pump - Ambulatory referral to Endocrinology - gabapentin (NEURONTIN) 600 MG tablet; TAKE 1 TABLET BY MOUTH THREE TIMES A DAY.  Dispense: 270 tablet; Refill: 2 - POCT HgB A1C  2. Depression, major, single episode, moderate (HCC) Well controlled, requesting refills - FLUoxetine (PROZAC) 40 MG capsule; Take 2 capsules (80 mg total) by mouth daily.  Dispense: 60 capsule; Refill: 0  3. Essential hypertension BP and HR well controlled, requesting refills - hydrochlorothiazide (HYDRODIURIL) 12.5 MG tablet; TAKE 1 TABLET BY MOUTH DAILY FOR BLOOD PRESSURE/EDEMA  Dispense: 30 tablet; Refill: 5 - metoprolol succinate (TOPROL-XL) 50 MG 24 hr tablet; Take 1 tablet (50 mg total) by mouth daily. Take with or immediately following a meal.  Dispense: 30 tablet; Refill: 5 - CBC w/Diff/Platelet - Comprehensive Metabolic Panel (CMET)  4. Inflammatory polyarthritis ([place]) Continue with Tramadol as needed--will need to consider alternative therapy options due to TID dosing  Controlled Substance Database was reviewed by me for overdose risk score (ORS) Reviewed risks and possible side effects associated with taking opiates, benzodiazepines and other CNS depressants. Combination of these could cause dizziness and drowsiness. Advised patient not to drive or operate machinery when taking these medications, as patient's and other's life can be at risk and will have consequences. Patient verbalized understanding in this matter. Dependence and abuse for these drugs will be monitored closely. A Controlled substance policy and procedure is on file which allows [facility] to order a urine drug screen test at any visit. Patient understands and agrees with the plan - traMADol (ULTRAM) 50 MG tablet; Take 1 tablet (50 mg total) by mouth 3 (three) times daily as needed for moderate pain.  Dispense: 90  tablet; Refill: 1  5. Other fatigue - CBC w/Diff/Platelet - Comprehensive Metabolic Panel (CMET) - Lipid Panel With LDL/HDL Ratio - TSH + free T4  General Counseling: Leshay verbalizes understanding of the findings of todays visit and agrees with plan of treatment. I have discussed any further diagnostic evaluation that may be needed or ordered today. We also reviewed her medications today. she has been encouraged to call the office with any questions or concerns that should arise related to todays visit.    Orders Placed This Encounter  Procedures  . CBC w/Diff/Platelet  . Comprehensive Metabolic Panel (CMET)  . Lipid Panel With LDL/HDL Ratio  . TSH + free T4  . Ambulatory referral to Endocrinology  . POCT HgB A1C    Meds ordered this encounter  Medications  . gabapentin (NEURONTIN) 600 MG tablet    Sig: TAKE 1 TABLET BY MOUTH THREE TIMES A DAY.    Dispense:  270 tablet    Refill:  2    Dx neuropathy  . FLUoxetine (PROZAC) 40 MG capsule    Sig: Take 2 capsules (80 mg total) by mouth daily.    Dispense:  60 capsule    Refill:  0  . hydrochlorothiazide (HYDRODIURIL) 12.5 MG tablet    Sig: TAKE 1 TABLET BY MOUTH DAILY FOR BLOOD PRESSURE/EDEMA    Dispense:  30 tablet    Refill:  5  . traMADol (ULTRAM) 50 MG tablet    Sig: Take 1 tablet (50 mg total) by mouth 3 (three) times daily as needed for moderate pain.    Dispense:  90 tablet    Refill:  1    Confirmed with patient no allergy to tramadol  . metoprolol succinate (TOPROL-XL) 50 MG 24 hr tablet    Sig: Take 1 tablet (50  mg total) by mouth daily. Take with or immediately following a meal.    Dispense:  30 tablet    Refill:  5    D/c bisoprolol due to back order    Time spent: 30 Minutes Time spent includes review of chart, medications, test results and follow-up plan with the patient.  This patient was seen by Theodoro Grist AGNP-C in Collaboration with Dr Lavera Guise as a part of collaborative care agreement      Barby Colvard. Viviane Semidey AGNP-C Internal medicine

## 2020-05-04 ENCOUNTER — Encounter: Payer: Self-pay | Admitting: Hospice and Palliative Medicine

## 2020-05-04 LAB — POCT GLYCOSYLATED HEMOGLOBIN (HGB A1C): Hemoglobin A1C: 10.6 % — AB (ref 4.0–5.6)

## 2020-05-14 ENCOUNTER — Other Ambulatory Visit: Payer: Self-pay | Admitting: Hospice and Palliative Medicine

## 2020-05-14 ENCOUNTER — Other Ambulatory Visit
Admission: RE | Admit: 2020-05-14 | Discharge: 2020-05-14 | Disposition: A | Discharge status: Home / Self Care | Payer: Medicare Other | Attending: Hospice and Palliative Medicine | Admitting: Hospice and Palliative Medicine

## 2020-05-14 DIAGNOSIS — R6889 Other general symptoms and signs: Secondary | ICD-10-CM | POA: Diagnosis not present

## 2020-05-14 DIAGNOSIS — E756 Lipid storage disorder, unspecified: Secondary | ICD-10-CM | POA: Diagnosis not present

## 2020-05-14 DIAGNOSIS — R5383 Other fatigue: Secondary | ICD-10-CM | POA: Insufficient documentation

## 2020-05-14 DIAGNOSIS — E538 Deficiency of other specified B group vitamins: Secondary | ICD-10-CM | POA: Insufficient documentation

## 2020-05-14 DIAGNOSIS — E038 Other specified hypothyroidism: Secondary | ICD-10-CM | POA: Insufficient documentation

## 2020-05-14 LAB — LIPID PANEL
Cholesterol: 360 mg/dL — ABNORMAL HIGH (ref 0–200)
HDL: 36 mg/dL — ABNORMAL LOW (ref >40)
LDL Cholesterol: UNDETERMINED mg/dL (ref 0–99)
Total CHOL/HDL Ratio: 10 RATIO
Triglycerides: 757 mg/dL — ABNORMAL HIGH (ref <150)
VLDL: UNDETERMINED mg/dL (ref 0–40)

## 2020-05-14 LAB — COMPREHENSIVE METABOLIC PANEL
ALT: 20 U/L (ref 0–44)
AST: 22 U/L (ref 15–41)
Albumin: 4 g/dL (ref 3.5–5.0)
Alkaline Phosphatase: 66 U/L (ref 38–126)
Anion gap: 10 (ref 5–15)
BUN: 15 mg/dL (ref 6–20)
CO2: 26 mmol/L (ref 22–32)
Calcium: 9.5 mg/dL (ref 8.9–10.3)
Chloride: 97 mmol/L — ABNORMAL LOW (ref 98–111)
Creatinine, Ser: 0.6 mg/dL (ref 0.44–1.00)
GFR, Estimated: >60 mL/min (ref >60)
Glucose, Bld: 281 mg/dL — ABNORMAL HIGH (ref 70–99)
Potassium: 4.1 mmol/L (ref 3.5–5.1)
Sodium: 133 mmol/L — ABNORMAL LOW (ref 135–145)
Total Bilirubin: 0.8 mg/dL (ref 0.3–1.2)
Total Protein: 7.9 g/dL (ref 6.5–8.1)

## 2020-05-14 LAB — CBC WITH DIFFERENTIAL/PLATELET
Abs Immature Granulocytes: 0.03 10*3/uL (ref 0.00–0.07)
Basophils Absolute: 0.1 10*3/uL (ref 0.0–0.1)
Basophils Relative: 1 %
Eosinophils Absolute: 0.4 10*3/uL (ref 0.0–0.5)
Eosinophils Relative: 6 %
HCT: 43.3 % (ref 36.0–46.0)
Hemoglobin: 15 g/dL (ref 12.0–15.0)
Immature Granulocytes: 0 %
Lymphocytes Relative: 42 %
Lymphs Abs: 3.1 10*3/uL (ref 0.7–4.0)
MCH: 28.3 pg (ref 26.0–34.0)
MCHC: 34.6 g/dL (ref 30.0–36.0)
MCV: 81.7 fL (ref 80.0–100.0)
Monocytes Absolute: 0.6 10*3/uL (ref 0.1–1.0)
Monocytes Relative: 8 %
Neutro Abs: 3.2 10*3/uL (ref 1.7–7.7)
Neutrophils Relative %: 43 %
Platelets: 276 10*3/uL (ref 150–400)
RBC: 5.3 MIL/uL — ABNORMAL HIGH (ref 3.87–5.11)
RDW: 12.5 % (ref 11.5–15.5)
WBC: 7.4 10*3/uL (ref 4.0–10.5)
nRBC: 0 % (ref 0.0–0.2)

## 2020-05-14 LAB — T4, FREE: Free T4: 0.95 ng/dL (ref 0.61–1.12)

## 2020-05-14 LAB — VITAMIN B12: Vitamin B-12: 284 pg/mL (ref 180–914)

## 2020-05-14 LAB — TSH: TSH: 0.286 u[IU]/mL — ABNORMAL LOW (ref 0.350–4.500)

## 2020-05-14 LAB — VITAMIN D 25 HYDROXY (VIT D DEFICIENCY, FRACTURES): Vit D, 25-Hydroxy: 38.58 ng/mL (ref 30–100)

## 2020-05-14 MED ORDER — GEMFIBROZIL 600 MG PO TABS: 600.0000 mg | ORAL_TABLET | Freq: Two times a day (BID) | ORAL | 1 refills | Status: DC

## 2020-05-14 NOTE — Progress Notes (Signed)
Please call and let her know I have reviewed her recent labs, due to significantly elevated triglyceride levels I have sent a prescription to her pharmacy for Lopid which will help lower triglyceride levels. We will discuss in more detail at her next appointment next month.

## 2020-05-15 LAB — LDL CHOLESTEROL, DIRECT: Direct LDL: 177.6 mg/dL — ABNORMAL HIGH (ref 0–99)

## 2020-05-28 ENCOUNTER — Other Ambulatory Visit: Payer: Self-pay | Admitting: Nurse Practitioner

## 2020-05-28 ENCOUNTER — Encounter: Payer: Self-pay | Admitting: "Endocrinology

## 2020-05-28 ENCOUNTER — Ambulatory Visit (INDEPENDENT_AMBULATORY_CARE_PROVIDER_SITE_OTHER): Payer: Medicare Other | Admitting: "Endocrinology

## 2020-05-28 ENCOUNTER — Other Ambulatory Visit: Payer: Self-pay

## 2020-05-28 DIAGNOSIS — E1142 Type 2 diabetes mellitus with diabetic polyneuropathy: Secondary | ICD-10-CM

## 2020-05-28 MED ORDER — FREESTYLE LIBRE 2 SENSOR MISC: 1.0000 | 3 refills | Status: DC

## 2020-05-28 MED ORDER — FREESTYLE LIBRE 2 READER DEVI: 0 refills | Status: DC

## 2020-05-28 MED ORDER — SIMVASTATIN 10 MG PO TABS: 10.0000 mg | ORAL_TABLET | Freq: Every day | ORAL | 2 refills | Status: DC

## 2020-05-28 NOTE — Progress Notes (Signed)
Endocrinology Consult Note       05/28/2020, 2:58 PM   Subjective:    Patient ID: Morgan Daniels, female    DOB: Nov 28, 1960.  Morgan Daniels is being seen in consultation for management of currently uncontrolled symptomatic diabetes requested by  Lavera Guise, MD.   Past Medical History:  Diagnosis Date  . Anxiety   . Arthritis   . Carotid artery occlusion   . Chronic kidney disease May 2017   UTI  . Depression   . Diabetes ([place])   . Diverticulosis   . Fatty liver   . Fibromyalgia   . GERD (gastroesophageal reflux disease)   . H/O hiatal hernia   . Hypertension   . IBS (irritable bowel syndrome)   . Peptic ulcer   . Plantar fasciitis   . Stroke [facility]) Dec. 14,2013   Right side-ministroke    Past Surgical History:  Procedure Laterality Date  . ABDOMINAL HYSTERECTOMY  1990  . BACK SURGERY  2000, 2004  . CAROTID ENDARTERECTOMY Left 05/06/12  . CARPAL TUNNEL RELEASE Left 07/18/2015   Procedure: CARPAL TUNNEL RELEASE;  Surgeon: Earnestine Leys, MD;  Location: [facility] ORS;  Service: Orthopedics;  Laterality: Left;  . CEREBRAL ANGIOGRAM Bilateral 05/03/2012   Procedure: CEREBRAL ANGIOGRAM;  Surgeon: Angelia Mould, MD;  Location: [facility] CATH LAB;  Service: Cardiovascular;  Laterality: Bilateral;  . CHOLECYSTECTOMY  2001  . COLONOSCOPY WITH PROPOFOL N/A 11/19/2018   Procedure: COLONOSCOPY WITH PROPOFOL;  Surgeon: Lucilla Lame, MD;  Location: [place];  Service: Endoscopy;  Laterality: N/A;  Diabetic - insulin  . ENDARTERECTOMY Left 05/06/2012   Procedure: ENDARTERECTOMY CAROTID;  Surgeon: Angelia Mould, MD;  Location: [place];  Service: Vascular;  Laterality: Left;  . ENDARTERECTOMY Right 08/09/2013   Procedure: ENDARTERECTOMY CAROTID-RIGHT;  Surgeon: Angelia Mould, MD;  Location: [place];  Service: Vascular;  Laterality: Right;  . HERNIA REPAIR    . PATCH ANGIOPLASTY Left  05/06/2012   Procedure: WITH DACRON PATCH ANGIOPLASTY ;  Surgeon: Angelia Mould, MD;  Location: [place];  Service: Vascular;  Laterality: Left;  . POLYPECTOMY  11/19/2018   Procedure: POLYPECTOMY;  Surgeon: Lucilla Lame, MD;  Location: [place];  Service: Endoscopy;;  . [place]  2004  . TONSILLECTOMY    . TUBAL LIGATION      Social History   Socioeconomic History  . Marital status: Married    Spouse name: Not on file  . Number of children: Not on file  . Years of education: Not on file  . Highest education level: Not on file  Occupational History  . Not on file  Tobacco Use  . Smoking status: Current Every Day Smoker    Packs/day: 1.00    Years: 40.00    Pack years: 40.00    Types: Cigarettes    Start date: 11/09/1970  . Smokeless tobacco: Never Used  . Tobacco comment:    Vaping Use  . Vaping Use: Former  Substance and Sexual Activity  . Alcohol use: Yes    Alcohol/week: 0.0 standard drinks    Comment: rarely  . Drug  use: No  . Sexual activity: Not Currently    Partners: Male  Other Topics Concern  . Not on file  Social History Narrative  . Not on file   Social Determinants of Health   Financial Resource Strain: Not on file  Food Insecurity: Not on file  Transportation Needs: Not on file  Physical Activity: Not on file  Stress: Not on file  Social Connections: Not on file    Family History  Problem Relation Age of Onset  . Hypertension Mother   . Hyperlipidemia Mother   . Deep vein thrombosis Mother   . Cancer Father   . Alcohol abuse Father   . Heart failure Father   . Hypertension Maternal Grandmother   . Deep vein thrombosis Sister   . Alcohol abuse Sister   . Alcohol abuse Sister     Outpatient Encounter Medications as of 05/28/2020  Medication Sig  . Continuous Blood Gluc Receiver (FREESTYLE LIBRE 2 READER) DEVI As directed  . Continuous Blood Gluc Sensor (FREESTYLE LIBRE 2 SENSOR) MISC 1 Piece by Does not apply route every  14 (fourteen) days.  . Insulin Aspart (NOVOLOG FLEXPEN [place]) Inject 15-21 Units into the skin 3 (three) times daily before meals.  . insulin degludec (TRESIBA) 200 UNIT/ML FlexTouch Pen Inject 60 Units into the skin at bedtime.  . Probiotic Product (PROBIOTIC PO) Take 1 tablet by mouth daily in the afternoon.  . simvastatin (ZOCOR) 10 MG tablet Take 1 tablet (10 mg total) by mouth at bedtime.  . Cholecalciferol (VITAMIN D-3) 5000 UNITS TABS Take 5,000 Units by mouth daily.   . diphenoxylate-atropine (LOMOTIL) 2.5-0.025 MG tablet Take 1 tablet by mouth 3 (three) times daily as needed for diarrhea or loose stools.  [name] FLUoxetine (PROZAC) 40 MG capsule Take 2 capsules (80 mg total) by mouth daily.  [name] gabapentin (NEURONTIN) 600 MG tablet TAKE 1 TABLET BY MOUTH THREE TIMES A DAY.  [name] gemfibrozil (LOPID) 600 MG tablet Take 1 tablet (600 mg total) by mouth 2 (two) times daily before a meal.  . hydrochlorothiazide (HYDRODIURIL) 12.5 MG tablet TAKE 1 TABLET BY MOUTH DAILY FOR BLOOD PRESSURE/EDEMA  . Insulin Pen Needle (ADVOCATE INSULIN PEN NEEDLES) 33G X 4 MM MISC To use with insulin dosing TID prior to meals and QHS for basal insulin.  . metoprolol succinate (TOPROL-XL) 50 MG 24 hr tablet Take 1 tablet (50 mg total) by mouth daily. Take with or immediately following a meal.  . omeprazole (PRILOSEC) 20 MG capsule Take 20 mg by mouth daily as needed.  . ondansetron (ZOFRAN ODT) 8 MG disintegrating tablet Take 1 tablet (8 mg total) by mouth every 8 (eight) hours as needed for nausea.  Glory Rosebush DELICA LANCETS FINE MISC CHECK BLOOD SUGAR THREE TIMES A DAY  . ONETOUCH VERIO test strip CHECK BLOOD SUGAR THREE TIMES A DAY  . Pancrelipase, Lip-Prot-Amyl, (ZENPEP) 40000-126000 units CPEP Take 1 capsule by mouth with breakfast, with lunch, and with evening meal. (Patient not taking: Reported on 05/28/2020)  . tiZANidine (ZANAFLEX) 4 MG tablet TAKE 1 TABLET(S) BY MOUTH TWICE A DAY AS NEEDED FOR MUSCLE PAIN AND SPASMS  .  traMADol (ULTRAM) 50 MG tablet Take 1 tablet (50 mg total) by mouth 3 (three) times daily as needed for moderate pain.  . [DISCONTINUED] Continuous Blood Gluc Receiver (DEXCOM G6 RECEIVER) DEVI Use as directed fto check blood sugars frequently - DX E11.65  . [DISCONTINUED] Continuous Blood Gluc Sensor (DEXCOM G6 SENSOR) MISC Use as directed fto  check blood sugars frequently - DX E11.65  . [DISCONTINUED] Continuous Blood Gluc Transmit (DEXCOM G6 TRANSMITTER) MISC Apply Paint as directed to check blood sugars frequently - DX E11.65  . [DISCONTINUED] insulin degludec (TRESIBA FLEXTOUCH) 200 UNIT/ML FlexTouch Pen Inject 70 Units into the skin daily. (Patient taking differently: Inject 80 Units into the skin daily.)  . [DISCONTINUED] NOVOLOG FLEXPEN 100 UNIT/ML FlexPen USE AS DIRECTED SLIDING SCALE WITH EACH MEALS MAXIMUM 42 UNITS NO MORE THAN 42 UNITS A DAY   No facility-administered encounter medications on file as of 05/28/2020.    ALLERGIES: Allergies  Allergen Reactions  . Simvastatin Diarrhea and Nausea And Vomiting  . Morphine And Related Itching  . Milk-Related Compounds Diarrhea    bloating  . Latex Rash    Rash when she wears gloves (Negative by test - per pt)    VACCINATION STATUS: Immunization History  Administered Date(s) Administered  . Influenza Inj Mdck Quad Pf 12/30/2017, 12/20/2018, 12/23/2019  . Influenza,inj,Quad PF,6+ Mos 12/30/2017  . PFIZER(Purple Top)SARS-COV-2 Vaccination 05/04/2019, 05/25/2019, 02/17/2020  . Td 04/18/2006    Diabetes She presents for her initial diabetic visit. She has type 2 diabetes mellitus. Her disease course has been worsening. There are no hypoglycemic associated symptoms. Pertinent negatives for hypoglycemia include no confusion, headaches, pallor or seizures. Associated symptoms include fatigue, polydipsia and polyuria. Pertinent negatives for diabetes include no chest pain and no polyphagia. There are no hypoglycemic complications. Symptoms  are worsening. Diabetic complications include a CVA, peripheral neuropathy and retinopathy. Risk factors for coronary artery disease include dyslipidemia, diabetes mellitus, family history, tobacco exposure, sedentary lifestyle, post-menopausal and hypertension. Current diabetic treatment includes insulin injections (She is currently on Tresiba 80 units nightly, NovoLog 1-2 times a day, maximum 42 units daily.). Her weight is fluctuating minimally. She is following a generally unhealthy diet. When asked about meal planning, she reported none. She has not had a previous visit with a dietitian. She rarely participates in exercise. Home blood sugar record trend: She wears Dexcom CGM, however does not use it properly. (She did not bring any logs nor meter.  Her recent A1c was 10.6%.  Her last 3 measurements were 10.6, 12.5, 9.7%.  Denies hypoglycemia.) An ACE inhibitor/angiotensin II receptor blocker is being taken. Eye exam is current.  Hyperlipidemia This is a chronic problem. The problem is uncontrolled. Exacerbating diseases include diabetes. Pertinent negatives include no chest pain, myalgias or shortness of breath. Current antihyperlipidemic treatment includes fibric acid derivatives. Risk factors for coronary artery disease include diabetes mellitus, dyslipidemia, hypertension, a sedentary lifestyle and post-menopausal.  Hypertension This is a chronic problem. The current episode started more than 1 year ago. Pertinent negatives include no chest pain, headaches, palpitations or shortness of breath. Risk factors for coronary artery disease include dyslipidemia, diabetes mellitus, obesity, sedentary lifestyle and smoking/tobacco exposure. Past treatments include diuretics. Hypertensive end-organ damage includes CVA and retinopathy.     Review of Systems  Constitutional: Positive for fatigue. Negative for chills, fever and unexpected weight change.  HENT: Negative for trouble swallowing and voice change.    Eyes: Negative for visual disturbance.  Respiratory: Negative for cough, shortness of breath and wheezing.   Cardiovascular: Negative for chest pain, palpitations and leg swelling.  Gastrointestinal: Negative for diarrhea, nausea and vomiting.  Endocrine: Positive for polydipsia and polyuria. Negative for cold intolerance, heat intolerance and polyphagia.  Musculoskeletal: Negative for arthralgias and myalgias.  Skin: Negative for color change, pallor, rash and wound.  Neurological: Negative for seizures and headaches.  Psychiatric/Behavioral: Negative for confusion and suicidal ideas.    Objective:    Vitals with BMI 05/28/2020 05/02/2020 02/03/2020  Height 5\' 7"  5\' 7"  5\' 7"   Weight 191 lbs 6 oz 191 lbs 6 oz 178 lbs 6 oz  BMI 29.97 30.86 57.84  Systolic - 696 295  Diastolic - 74 90  Pulse - 94 75  Some encounter information is confidential and restricted. Go to Review Flowsheets activity to see all data.    Ht 5\' 7"  (1.702 m)   Wt 191 lb 6.4 oz (86.8 kg)   BMI 29.98 kg/m   Wt Readings from Last 3 Encounters:  05/28/20 191 lb 6.4 oz (86.8 kg)  05/02/20 191 lb 6.4 oz (86.8 kg)  02/03/20 178 lb 6.4 oz (80.9 kg)     Physical Exam Constitutional:      Appearance: She is well-developed.  HENT:     Head: Normocephalic and atraumatic.  Neck:     Thyroid: No thyromegaly.     Trachea: No tracheal deviation.  Cardiovascular:     Rate and Rhythm: Normal rate and regular rhythm.  Pulmonary:     Effort: Pulmonary effort is normal.  Abdominal:     Tenderness: There is no abdominal tenderness. There is no guarding.  Musculoskeletal:        General: Normal range of motion.     Cervical back: Normal range of motion and neck supple.  Skin:    General: Skin is warm and dry.     Coloration: Skin is not pale.     Findings: No erythema or rash.  Neurological:     Mental Status: She is alert and oriented to person, place, and time.     Cranial Nerves: No cranial nerve deficit.      Coordination: Coordination normal.     Deep Tendon Reflexes: Reflexes are normal and symmetric.  Psychiatric:        Judgment: Judgment normal.       CMP ( most recent) CMP     Component Value Date/Time   NA 133 (L) 05/14/2020 1025   NA 138 07/30/2013 0436   K 4.1 05/14/2020 1025   K 4.0 07/30/2013 0436   CL 97 (L) 05/14/2020 1025   CL 103 07/30/2013 0436   CO2 26 05/14/2020 1025   CO2 26 07/30/2013 0436   GLUCOSE 281 (H) 05/14/2020 1025   GLUCOSE 109 (H) 07/30/2013 0436   BUN 15 05/14/2020 1025   BUN 15 07/30/2013 0436   CREATININE 0.60 05/14/2020 1025   CREATININE 0.67 07/30/2013 0436   CALCIUM 9.5 05/14/2020 1025   CALCIUM 8.8 07/30/2013 0436   PROT 7.9 05/14/2020 1025   PROT 7.8 07/29/2013 0650   ALBUMIN 4.0 05/14/2020 1025   ALBUMIN 3.5 07/29/2013 0650   AST 22 05/14/2020 1025   AST 25 07/29/2013 0650   ALT 20 05/14/2020 1025   ALT 38 07/29/2013 0650   ALKPHOS 66 05/14/2020 1025   ALKPHOS 83 07/29/2013 0650   BILITOT 0.8 05/14/2020 1025   BILITOT 0.2 07/29/2013 0650   GFRNONAA >60 05/14/2020 1025   GFRNONAA >60 07/30/2013 0436   GFRAA >60 02/03/2019 1126   GFRAA >60 07/30/2013 0436     Diabetic Labs (most recent): Lab Results  Component Value Date   HGBA1C 10.6 (A) 05/04/2020   HGBA1C 12.5 (A) 12/23/2019   HGBA1C 9.7 (A) 07/28/2019     Lipid Panel ( most recent) Lipid Panel     Component Value Date/Time   CHOL  360 (H) 05/14/2020 1025   CHOL 211 (H) 07/30/2013 0436   TRIG 757 (H) 05/14/2020 1025   TRIG 607 (H) 07/30/2013 0436   HDL 36 (L) 05/14/2020 1025   HDL 24 (L) 07/30/2013 0436   CHOLHDL 10.0 05/14/2020 1025   VLDL UNABLE TO CALCULATE IF TRIGLYCERIDE OVER 400 mg/dL 05/14/2020 1025   VLDL SEE COMMENT 07/30/2013 0436   LDLCALC UNABLE TO CALCULATE IF TRIGLYCERIDE OVER 400 mg/dL 05/14/2020 1025   LDLCALC SEE COMMENT 07/30/2013 0436   LDLDIRECT 177.6 (H) 05/14/2020 1025      Lab Results  Component Value Date   TSH 0.286 (L) 05/14/2020    TSH 0.789 03/26/2018   TSH 1.238 12/07/2014   TSH 3.02 08/04/2012   TSH 1.76 02/21/2011   FREET4 0.95 05/14/2020   FREET4 0.85 03/26/2018   FREET4 0.76 12/07/2014      Assessment & Plan:   1. Type 2 diabetes mellitus with polyneuropathy (HCC)  - TAYVIA FAUGHNAN has currently uncontrolled symptomatic type 2 DM since  60 years of age,  with most recent A1c of 10.6 %. Recent labs reviewed. - I had a long discussion with her about the progressive nature of diabetes and the pathology behind its complications. -her diabetes is complicated by carotid atherosclerosis, peripheral neuropathy, CVA, retinopathy, current smoking and she remains at a high risk for more acute and chronic complications which include CAD, CVA, CKD, retinopathy, and neuropathy. These are all discussed in detail with her.  - I have counseled her on diet  and weight management  by adopting a carbohydrate restricted/protein rich diet. Patient is encouraged to switch to  unprocessed or minimally processed     complex starch and increased protein intake (animal or plant source), fruits, and vegetables. -  she is advised to stick to a routine mealtimes to eat 3 meals  a day and avoid unnecessary snacks ( to snack only to correct hypoglycemia).   - she acknowledges that there is a room for improvement in her food and drink choices. - Suggestion is made for her to avoid simple carbohydrates  from her diet including Cakes, Sweet Desserts, Ice Cream, Soda (diet and regular), Sweet Tea, Candies, Chips, Cookies, Store Bought Juices, Alcohol in Excess of  1-2 drinks a day, Artificial Sweeteners,  Coffee Creamer, and "Sugar-free" Products. This will help patient to have more stable blood glucose profile and potentially avoid unintended weight gain.  - she will be scheduled with Jearld Fenton, RDN, CDE for diabetes education.  - I have approached her with the following individualized plan to manage  her diabetes and patient agrees:    -Based on her recent 3 measurements of A1c significantly above target, she will continue to need intensive treatment with basal/bolus insulin in order for her to achieve and maintain control of diabetes to target. Accordingly, she is advised to resume and continue Tresiba at 60 units nightly, adjust her NovoLog at 15  units 3 times a day with meals  for pre-meal BG readings of 90-150mg /dl, plus patient specific correction dose for unexpected hyperglycemia above 150mg /dl, associated with strict monitoring of glucose 4 times a day-before meals and at bedtime. - she is warned not to take insulin without proper monitoring per orders. - Adjustment parameters are given to her for hypo and hyperglycemia in writing. - she is encouraged to call clinic for blood glucose levels less than 70 or above 300 mg /dl. - she reports that she does not tolerate Metformin, GLP-1 receptor agonists.  She has severe hypertriglyceridemia, at risk for pancreatitis from GLP-1 receptor agonist or DPP 4 inhibitors.  She will however be considered for low-dose glipizide during her next visit. -Patient will definitely benefit from a CGM.  She has problem using Dexcom.  I discussed and prescribed the freestyle libre device for her.   - Specific targets for  A1c;  LDL, HDL,  and Triglycerides were discussed with the patient.  2) Blood Pressure /Hypertension:  her blood pressure is  controlled to target.   she is advised to continue her current medications including hydrochlorothiazide 12.5 mg p.o. daily, metoprolol 50 mg p.o. daily.    3) Lipids/Hyperlipidemia:   Review of her recent lipid panel showed uncontrolled glycemia 757, LDL not calculated.  She was given a prescription for gemfibrozil which she did not pick up.  She is advised to pick up gemfibrozil as well as discussed and added simvastatin 10 mg p.o. nightly.  She reports that she has used simvastatin in the past with no side effects.     4)  Weight/Diet:  Body mass  index is 29.98 kg/m.  -   clearly complicating her diabetes care.   she is  a candidate for weight loss. I discussed with her the fact that loss of 5 - 10% of her  current body weight will have the most impact on her diabetes management.  Exercise, and detailed carbohydrates information provided  -  detailed on discharge instructions.  5) Chronic Care/Health Maintenance:  -she  Is not  on ACEI/ARB and Statin medications and  is encouraged to initiate and continue to follow up with Ophthalmology, Dentist,  Podiatrist at least yearly or according to recommendations, and advised to  quit smoking. I have recommended yearly flu vaccine and pneumonia vaccine at least every 5 years; moderate intensity exercise for up to 150 minutes weekly; and  sleep for at least 7 hours a day.  - she is  advised to maintain close follow up with Lavera Guise, MD for primary care needs, as well as her other providers for optimal and coordinated care.   I spent 65 minutes in the care of the patient today including review of labs from [facility], Lipids, Thyroid Function, Hematology (current and previous including abstractions from other facilities); face-to-face time discussing  her blood glucose readings/logs, discussing hypoglycemia and hyperglycemia episodes and symptoms, medications doses, her options of short and long term treatment based on the latest standards of care / guidelines;  discussion about incorporating lifestyle medicine;  and documenting the encounter. We have addressed her currently uncontrolled type 2 diabetes, hyperlipidemia, hypertension, smoking.    Please refer to Patient Instructions for Blood Glucose Monitoring and Insulin/Medications Dosing Guide"  in media tab for additional information. Please  also refer to " Patient Self Inventory" in the Media  tab for reviewed elements of pertinent patient history.  [name] participated in the discussions, expressed understanding, and voiced agreement with  the above plans.  All questions were answered to her satisfaction. she is encouraged to contact clinic should she have any questions or concerns prior to her return visit.   Follow up plan: - Return in about 10 days (around 06/07/2020) for F/U with Meter and Logs Only - no Labs.  Glade Lloyd, MD [facility] [address] [place], [place] [address] Phone: [phone]  Fax: [phone]    05/28/2020, 2:58 PM  This note was partially dictated with voice recognition software. Similar sounding words  can be transcribed inadequately or may not  be corrected upon review.

## 2020-05-28 NOTE — Patient Instructions (Signed)

## 2020-05-29 ENCOUNTER — Telehealth: Payer: Self-pay | Admitting: "Endocrinology

## 2020-05-29 DIAGNOSIS — E1142 Type 2 diabetes mellitus with diabetic polyneuropathy: Secondary | ICD-10-CM

## 2020-05-29 MED ORDER — FREESTYLE LIBRE 2 SENSOR MISC: 1.0000 | 3 refills | Status: DC

## 2020-05-29 MED ORDER — FREESTYLE LIBRE 2 READER DEVI: 0 refills | Status: DC

## 2020-05-29 NOTE — Telephone Encounter (Signed)
Pt called and said that the pharmacy reached out to her and said that the office did not give enough information for the free style libre.

## 2020-05-29 NOTE — Telephone Encounter (Signed)
Ryan with [facility] stated they needed pt's diagnosis code in order to file Medicare Part B. Rx's resent with diagnosis code.

## 2020-05-31 ENCOUNTER — Other Ambulatory Visit: Payer: Self-pay

## 2020-05-31 ENCOUNTER — Telehealth: Payer: Self-pay

## 2020-05-31 DIAGNOSIS — E1142 Type 2 diabetes mellitus with diabetic polyneuropathy: Secondary | ICD-10-CM

## 2020-05-31 MED ORDER — INSULIN DEGLUDEC 200 UNIT/ML ~~LOC~~ SOPN: 60.0000 [IU] | PEN_INJECTOR | Freq: Every day | SUBCUTANEOUS | 0 refills | Status: DC

## 2020-05-31 NOTE — Telephone Encounter (Signed)
Patient left a VM that she needs a refill on her insulin degludec (TRESIBA) 200 UNIT/ML FlexTouch Pen   and please send to [facility] [address] [facility]

## 2020-05-31 NOTE — Telephone Encounter (Signed)
Sent in

## 2020-06-07 ENCOUNTER — Other Ambulatory Visit: Payer: Self-pay | Admitting: Hospice and Palliative Medicine

## 2020-06-11 ENCOUNTER — Ambulatory Visit: Payer: Medicare Other | Admitting: "Endocrinology

## 2020-06-13 ENCOUNTER — Encounter: Payer: Self-pay | Admitting: Hospice and Palliative Medicine

## 2020-06-13 ENCOUNTER — Ambulatory Visit: Payer: Medicare Other | Admitting: Hospice and Palliative Medicine

## 2020-06-13 ENCOUNTER — Other Ambulatory Visit: Payer: Self-pay

## 2020-06-13 VITALS — BP 138/82 | HR 79 | Temp 98.4°F | Resp 16 | Ht 67.0 in | Wt 193.0 lb

## 2020-06-13 DIAGNOSIS — E871 Hypo-osmolality and hyponatremia: Secondary | ICD-10-CM

## 2020-06-13 DIAGNOSIS — E781 Pure hyperglyceridemia: Secondary | ICD-10-CM | POA: Diagnosis not present

## 2020-06-13 DIAGNOSIS — Z794 Long term (current) use of insulin: Secondary | ICD-10-CM | POA: Diagnosis not present

## 2020-06-13 DIAGNOSIS — E1140 Type 2 diabetes mellitus with diabetic neuropathy, unspecified: Secondary | ICD-10-CM

## 2020-06-13 DIAGNOSIS — I10 Essential (primary) hypertension: Secondary | ICD-10-CM

## 2020-06-13 DIAGNOSIS — R300 Dysuria: Secondary | ICD-10-CM

## 2020-06-13 LAB — POCT URINALYSIS DIPSTICK
Bilirubin, UA: NEGATIVE
Glucose, UA: NEGATIVE
Ketones, UA: NEGATIVE
Nitrite, UA: POSITIVE
Protein, UA: POSITIVE — AB
Spec Grav, UA: 1.025 (ref 1.010–1.025)
Urobilinogen, UA: 0.2 E.U./dL
pH, UA: 6 (ref 5.0–8.0)

## 2020-06-13 MED ORDER — GEMFIBROZIL 600 MG PO TABS: 600.0000 mg | ORAL_TABLET | Freq: Two times a day (BID) | ORAL | 1 refills | Status: DC

## 2020-06-13 MED ORDER — NITROFURANTOIN MONOHYD MACRO 100 MG PO CAPS
100.0000 mg | ORAL_CAPSULE | Freq: Two times a day (BID) | ORAL | 0 refills | Status: DC
Start: 2020-06-13 — End: 2020-08-28

## 2020-06-13 MED ORDER — FLUCONAZOLE 150 MG PO TABS: ORAL_TABLET | ORAL | 0 refills | Status: DC

## 2020-06-13 NOTE — Progress Notes (Signed)
[facility] [place], [place] [address]  Internal MEDICINE  Office Visit Note  Patient Name: Morgan Daniels  [id number]  [id number]  Date of Service: 06/20/2020  Chief Complaint  Patient presents with  . Follow-up    Med review, request urine be checked due to urine has been dark for the last 3 days    HPI Patient is here for routine follow-up Endocrinologist started her on simvastatin--told by her pharmacy to not take both Lopid as well as Simvastatin, stopped Lopid, started Simvastatin--caused severe GI upset and discontinued medication Has a follow-up with Endocrinology tomorrow--will discuss intolerability to simvastatin Emphasized the importance of needing treatment for elevated triglycerides as well as overall cholesterol levels Encouraged to restart Lopid if endocrinologist did not adjust therapy  Feeling better overall, per her reports A1C has improved   Requesting to have her urine checked, over the last 3 days noticed that her urine is dark in color, denies any further urinary symptoms  Current Medication: Outpatient Encounter Medications as of 06/13/2020  Medication Sig Note  . fluconazole (DIFLUCAN) 150 MG tablet Take one tablet by mouth for one dose, may repeat dosing in 3 days for recurring symptoms, may repeat for two additional doses.   . nitrofurantoin, macrocrystal-monohydrate, (MACROBID) 100 MG capsule Take 1 capsule (100 mg total) by mouth 2 (two) times daily.   . Cholecalciferol (VITAMIN D-3) 5000 UNITS TABS Take 5,000 Units by mouth daily.    . Continuous Blood Gluc Receiver (FREESTYLE LIBRE 2 READER) DEVI As directed   . Continuous Blood Gluc Sensor (FREESTYLE LIBRE 2 SENSOR) MISC 1 Piece by Does not apply route every 14 (fourteen) days.   . diphenoxylate-atropine (LOMOTIL) 2.5-0.025 MG tablet Take 1 tablet by mouth 3 (three) times daily as needed for diarrhea or loose stools.   [name] FLUoxetine (PROZAC) 40 MG capsule Take 2 capsules (80 mg  total) by mouth daily.   [name] gabapentin (NEURONTIN) 600 MG tablet TAKE 1 TABLET BY MOUTH THREE TIMES A DAY.   [name] gemfibrozil (LOPID) 600 MG tablet Take 1 tablet (600 mg total) by mouth 2 (two) times daily before a meal.   . hydrochlorothiazide (HYDRODIURIL) 12.5 MG tablet TAKE 1 TABLET BY MOUTH DAILY FOR BLOOD PRESSURE/EDEMA   . Insulin Aspart (NOVOLOG FLEXPEN Reynolds) Inject 18-24 Units into the skin 3 (three) times daily before meals.   . Insulin Pen Needle (ADVOCATE INSULIN PEN NEEDLES) 33G X 4 MM MISC To use with insulin dosing TID prior to meals and QHS for basal insulin.   . metoprolol succinate (TOPROL-XL) 50 MG 24 hr tablet Take 1 tablet (50 mg total) by mouth daily. Take with or immediately following a meal.   . omeprazole (PRILOSEC) 20 MG capsule Take 20 mg by mouth daily as needed.   . ondansetron (ZOFRAN ODT) 8 MG disintegrating tablet Take 1 tablet (8 mg total) by mouth every 8 (eight) hours as needed for nausea.   Glory Rosebush DELICA LANCETS FINE MISC CHECK BLOOD SUGAR THREE TIMES A DAY 11/09/2014: Received from: External Pharmacy  . ONETOUCH VERIO test strip CHECK BLOOD SUGAR THREE TIMES A DAY 11/09/2014: Received from: External Pharmacy  . Pancrelipase, Lip-Prot-Amyl, (ZENPEP) 40000-126000 units CPEP Take 1 capsule by mouth with breakfast, with lunch, and with evening meal. (Patient not taking: Reported on 05/28/2020)   . Probiotic Product (PROBIOTIC PO) Take 1 tablet by mouth daily in the afternoon.   [name] tiZANidine (ZANAFLEX) 4 MG tablet TAKE 1 TABLET(S) BY MOUTH TWICE A  DAY AS NEEDED FOR MUSCLE PAIN AND SPASMS   . traMADol (ULTRAM) 50 MG tablet Take 1 tablet (50 mg total) by mouth 3 (three) times daily as needed for moderate pain.   . [DISCONTINUED] gemfibrozil (LOPID) 600 MG tablet TAKE 1 TABLET (600 MG TOTAL) BY MOUTH 2 (TWO) TIMES DAILY BEFORE A MEAL.   . [DISCONTINUED] insulin degludec (TRESIBA) 200 UNIT/ML FlexTouch Pen Inject 60 Units into the skin at bedtime.   . [DISCONTINUED]  simvastatin (ZOCOR) 10 MG tablet Take 1 tablet (10 mg total) by mouth at bedtime.    No facility-administered encounter medications on file as of 06/13/2020.    Surgical History: Past Surgical History:  Procedure Laterality Date  . ABDOMINAL HYSTERECTOMY  1990  . BACK SURGERY  2000, 2004  . CAROTID ENDARTERECTOMY Left 05/06/12  . CARPAL TUNNEL RELEASE Left 07/18/2015   Procedure: CARPAL TUNNEL RELEASE;  Surgeon: Earnestine Leys, MD;  Location: [facility] OR[facility];  Service: Orthopedics;  Laterality: Left;  . CEREBRAL ANGIOGRAM Bilateral 05/03/2012   Procedure: CEREBRAL ANGIOGRAM;  Surgeon: Angelia Mould, MD;  Location: [facility] CATH LAB;  Service: Cardiovascular;  Laterality: Bilateral;  . CHOLECYSTECTOMY  2001  . COLONOSCOPY WITH PROPOFOL N/A 11/19/2018   Procedure: COLONOSCOPY WITH PROPOFOL;  Surgeon: Lucilla Lame, MD;  Location: [place];  Service: Endoscopy;  Laterality: N/A;  Diabetic - insulin  . ENDARTERECTOMY Left 05/06/2012   Procedure: ENDARTERECTOMY CAROTID;  Surgeon: Angelia Mould, MD;  Location: [place];  Service: Vascular;  Laterality: Left;  . ENDARTERECTOMY Right 08/09/2013   Procedure: ENDARTERECTOMY CAROTID-RIGHT;  Surgeon: Angelia Mould, MD;  Location: [place];  Service: Vascular;  Laterality: Right;  . HERNIA REPAIR    . PATCH ANGIOPLASTY Left 05/06/2012   Procedure: WITH DACRON PATCH ANGIOPLASTY ;  Surgeon: Angelia Mould, MD;  Location: [place];  Service: Vascular;  Laterality: Left;  . POLYPECTOMY  11/19/2018   Procedure: POLYPECTOMY;  Surgeon: Lucilla Lame, MD;  Location: [place];  Service: Endoscopy;;  . Fifty-Six  2004  . TONSILLECTOMY    . TUBAL LIGATION      Medical History: Past Medical History:  Diagnosis Date  . Anxiety   . Arthritis   . Carotid artery occlusion   . Chronic kidney disease May 2017   UTI  . Depression   . Diabetes ([place])   . Diverticulosis   . Fatty liver   . Fibromyalgia   . GERD (gastroesophageal  reflux disease)   . H/O hiatal hernia   . Hypertension   . IBS (irritable bowel syndrome)   . Peptic ulcer   . Plantar fasciitis   . Stroke [facility]) Dec. 14,2013   Right side-ministroke    Family History: Family History  Problem Relation Age of Onset  . Hypertension Mother   . Hyperlipidemia Mother   . Deep vein thrombosis Mother   . Cancer Father   . Alcohol abuse Father   . Heart failure Father   . Hypertension Maternal Grandmother   . Deep vein thrombosis Sister   . Alcohol abuse Sister   . Alcohol abuse Sister     Social History   Socioeconomic History  . Marital status: Married    Spouse name: Not on file  . Number of children: Not on file  . Years of education: Not on file  . Highest education level: Not on file  Occupational History  . Not on file  Tobacco Use  . Smoking status: Current Every Day Smoker  Packs/day: 1.00    Years: 40.00    Pack years: 40.00    Types: Cigarettes    Start date: 11/09/1970  . Smokeless tobacco: Never Used  . Tobacco comment:    Vaping Use  . Vaping Use: Former  Substance and Sexual Activity  . Alcohol use: Yes    Alcohol/week: 0.0 standard drinks    Comment: rarely  . Drug use: No  . Sexual activity: Not Currently    Partners: Male  Other Topics Concern  . Not on file  Social History Narrative  . Not on file   Social Determinants of Health   Financial Resource Strain: Not on file  Food Insecurity: Not on file  Transportation Needs: Not on file  Physical Activity: Not on file  Stress: Not on file  Social Connections: Not on file  Intimate Partner Violence: Not on file      Review of Systems  Constitutional: Negative for chills, diaphoresis and fatigue.  HENT: Negative for ear pain, postnasal drip and sinus pressure.   Eyes: Negative for photophobia, discharge, redness, itching and visual disturbance.  Respiratory: Negative for cough, shortness of breath and wheezing.   Cardiovascular: Negative for chest  pain, palpitations and leg swelling.  Gastrointestinal: Negative for abdominal pain, constipation, diarrhea, nausea and vomiting.  Genitourinary: Negative for dysuria and flank pain.  Musculoskeletal: Negative for arthralgias, back pain, gait problem and neck pain.  Skin: Negative for color change.  Allergic/Immunologic: Negative for environmental allergies and food allergies.  Neurological: Negative for dizziness and headaches.  Hematological: Does not bruise/bleed easily.  Psychiatric/Behavioral: Negative for agitation, behavioral problems (depression) and hallucinations.    Vital Signs: BP 138/82   Pulse 79   Temp 98.4 F (36.9 C)   Resp 16   Ht 5\' 7"  (1.702 m)   Wt 193 lb (87.5 kg)   SpO2 94%   BMI 30.23 kg/m    Physical Exam Vitals reviewed.  Constitutional:      Appearance: Normal appearance. She is obese.  Cardiovascular:     Rate and Rhythm: Normal rate and regular rhythm.     Pulses: Normal pulses.     Heart sounds: Normal heart sounds.  Pulmonary:     Effort: Pulmonary effort is normal.     Breath sounds: Normal breath sounds.  Abdominal:     General: Abdomen is flat.     Palpations: Abdomen is soft.  Musculoskeletal:        General: Normal range of motion.     Cervical back: Normal range of motion.  Skin:    General: Skin is warm.  Neurological:     General: No focal deficit present.     Mental Status: She is alert and oriented to person, place, and time. Mental status is at baseline.  Psychiatric:        Mood and Affect: Mood normal.        Behavior: Behavior normal.        Thought Content: Thought content normal.        Judgment: Judgment normal.     Assessment/Plan: 1. Essential hypertension BP and HR remain well controlled on current therapy, continue to monitor  2. Hypertriglyceridemia Update lipid panel Will need to start treatment--discuss with endocrinologist tomorrow - Lipid Panel With LDL/HDL Ratio - gemfibrozil (LOPID) 600 MG tablet;  Take 1 tablet (600 mg total) by mouth 2 (two) times daily before a meal.  Dispense: 60 tablet; Refill: 1  3. Hyponatremia Repeat labs and adjust therapy  as indicated - Comprehensive Metabolic Panel (CMET)  4. Type 2 diabetes mellitus with diabetic neuropathy, with long-term current use of insulin (HCC) Followed closely and managed by endocrinology  5. Dysuria Initiate therapy with Macrobid--await urine culture results and adjust therapy as indicated Diflucan for history of vaginal yeast infections with antibiotic use - nitrofurantoin, macrocrystal-monohydrate, (MACROBID) 100 MG capsule; Take 1 capsule (100 mg total) by mouth 2 (two) times daily.  Dispense: 20 capsule; Refill: 0 - fluconazole (DIFLUCAN) 150 MG tablet; Take one tablet by mouth for one dose, may repeat dosing in 3 days for recurring symptoms, may repeat for two additional doses.  Dispense: 3 tablet; Refill: 0 - POCT Urinalysis Dipstick - CULTURE, URINE COMPREHENSIVE  General Counseling: Shajuana verbalizes understanding of the findings of todays visit and agrees with plan of treatment. I have discussed any further diagnostic evaluation that may be needed or ordered today. We also reviewed her medications today. she has been encouraged to call the office with any questions or concerns that should arise related to todays visit.    Orders Placed This Encounter  Procedures  . CULTURE, URINE COMPREHENSIVE  . Lipid Panel With LDL/HDL Ratio  . Comprehensive Metabolic Panel (CMET)  . POCT Urinalysis Dipstick    Meds ordered this encounter  Medications  . gemfibrozil (LOPID) 600 MG tablet    Sig: Take 1 tablet (600 mg total) by mouth 2 (two) times daily before a meal.    Dispense:  60 tablet    Refill:  1  . nitrofurantoin, macrocrystal-monohydrate, (MACROBID) 100 MG capsule    Sig: Take 1 capsule (100 mg total) by mouth 2 (two) times daily.    Dispense:  20 capsule    Refill:  0  . fluconazole (DIFLUCAN) 150 MG tablet     Sig: Take one tablet by mouth for one dose, may repeat dosing in 3 days for recurring symptoms, may repeat for two additional doses.    Dispense:  3 tablet    Refill:  0    Time spent:30 Minutes Time spent includes review of chart, medications, test results and follow-up plan with the patient.  This patient was seen by Theodoro Grist AGNP-C in Collaboration with Dr Lavera Guise as a part of collaborative care agreement     Anise Ziegler. Lennon Boutwell AGNP-C Internal medicine

## 2020-06-14 ENCOUNTER — Encounter: Payer: Self-pay | Admitting: "Endocrinology

## 2020-06-14 ENCOUNTER — Ambulatory Visit (INDEPENDENT_AMBULATORY_CARE_PROVIDER_SITE_OTHER): Payer: Medicare Other | Admitting: "Endocrinology

## 2020-06-14 VITALS — BP 170/84 | HR 76 | Ht 67.0 in | Wt 191.3 lb

## 2020-06-14 DIAGNOSIS — E1142 Type 2 diabetes mellitus with diabetic polyneuropathy: Secondary | ICD-10-CM

## 2020-06-14 MED ORDER — INSULIN DEGLUDEC 200 UNIT/ML ~~LOC~~ SOPN: 70.0000 [IU] | PEN_INJECTOR | Freq: Every day | SUBCUTANEOUS | 2 refills | Status: DC

## 2020-06-14 NOTE — Progress Notes (Signed)
06/14/2020, 4:48 PM   Endocrinology follow-up note  Subjective:    Patient ID: Morgan Daniels, female    DOB: Oct 04, 1960.  Morgan Daniels is being seen in follow-up after she was seen in consultation for management of currently uncontrolled symptomatic diabetes requested by  Lavera Guise, MD.   Past Medical History:  Diagnosis Date  . Anxiety   . Arthritis   . Carotid artery occlusion   . Chronic kidney disease May 2017   UTI  . Depression   . Diabetes ([place])   . Diverticulosis   . Fatty liver   . Fibromyalgia   . GERD (gastroesophageal reflux disease)   . H/O hiatal hernia   . Hypertension   . IBS (irritable bowel syndrome)   . Peptic ulcer   . Plantar fasciitis   . Stroke [facility]) Dec. 14,2013   Right side-ministroke    Past Surgical History:  Procedure Laterality Date  . ABDOMINAL HYSTERECTOMY  1990  . BACK SURGERY  2000, 2004  . CAROTID ENDARTERECTOMY Left 05/06/12  . CARPAL TUNNEL RELEASE Left 07/18/2015   Procedure: CARPAL TUNNEL RELEASE;  Surgeon: Earnestine Leys, MD;  Location: [facility] ORS;  Service: Orthopedics;  Laterality: Left;  . CEREBRAL ANGIOGRAM Bilateral 05/03/2012   Procedure: CEREBRAL ANGIOGRAM;  Surgeon: Angelia Mould, MD;  Location: [facility] CATH LAB;  Service: Cardiovascular;  Laterality: Bilateral;  . CHOLECYSTECTOMY  2001  . COLONOSCOPY WITH PROPOFOL N/A 11/19/2018   Procedure: COLONOSCOPY WITH PROPOFOL;  Surgeon: Lucilla Lame, MD;  Location: [place];  Service: Endoscopy;  Laterality: N/A;  Diabetic - insulin  . ENDARTERECTOMY Left 05/06/2012   Procedure: ENDARTERECTOMY CAROTID;  Surgeon: Angelia Mould, MD;  Location: [place];  Service: Vascular;  Laterality: Left;  . ENDARTERECTOMY Right 08/09/2013   Procedure: ENDARTERECTOMY CAROTID-RIGHT;  Surgeon: Angelia Mould, MD;  Location: [place];  Service: Vascular;  Laterality: Right;  . HERNIA REPAIR     . PATCH ANGIOPLASTY Left 05/06/2012   Procedure: WITH DACRON PATCH ANGIOPLASTY ;  Surgeon: Angelia Mould, MD;  Location: [place];  Service: Vascular;  Laterality: Left;  . POLYPECTOMY  11/19/2018   Procedure: POLYPECTOMY;  Surgeon: Lucilla Lame, MD;  Location: [place];  Service: Endoscopy;;  . [place]  2004  . TONSILLECTOMY    . TUBAL LIGATION      Social History   Socioeconomic History  . Marital status: Married    Spouse name: Not on file  . Number of children: Not on file  . Years of education: Not on file  . Highest education level: Not on file  Occupational History  . Not on file  Tobacco Use  . Smoking status: Current Every Day Smoker    Packs/day: 1.00    Years: 40.00    Pack years: 40.00    Types: Cigarettes    Start date: 11/09/1970  . Smokeless tobacco: Never Used  . Tobacco comment:    Vaping Use  . Vaping Use: Former  Substance and Sexual Activity  . Alcohol use: Yes    Alcohol/week: 0.0 standard drinks  Comment: rarely  . Drug use: No  . Sexual activity: Not Currently    Partners: Male  Other Topics Concern  . Not on file  Social History Narrative  . Not on file   Social Determinants of Health   Financial Resource Strain: Not on file  Food Insecurity: Not on file  Transportation Needs: Not on file  Physical Activity: Not on file  Stress: Not on file  Social Connections: Not on file    Family History  Problem Relation Age of Onset  . Hypertension Mother   . Hyperlipidemia Mother   . Deep vein thrombosis Mother   . Cancer Father   . Alcohol abuse Father   . Heart failure Father   . Hypertension Maternal Grandmother   . Deep vein thrombosis Sister   . Alcohol abuse Sister   . Alcohol abuse Sister     Outpatient Encounter Medications as of 06/14/2020  Medication Sig  . Cholecalciferol (VITAMIN D-3) 5000 UNITS TABS Take 5,000 Units by mouth daily.   . Continuous Blood Gluc Receiver (FREESTYLE LIBRE 2 READER) DEVI As  directed  . Continuous Blood Gluc Sensor (FREESTYLE LIBRE 2 SENSOR) MISC 1 Piece by Does not apply route every 14 (fourteen) days.  . diphenoxylate-atropine (LOMOTIL) 2.5-0.025 MG tablet Take 1 tablet by mouth 3 (three) times daily as needed for diarrhea or loose stools.  . fluconazole (DIFLUCAN) 150 MG tablet Take one tablet by mouth for one dose, may repeat dosing in 3 days for recurring symptoms, may repeat for two additional doses.  [name] FLUoxetine (PROZAC) 40 MG capsule Take 2 capsules (80 mg total) by mouth daily.  [name] gabapentin (NEURONTIN) 600 MG tablet TAKE 1 TABLET BY MOUTH THREE TIMES A DAY.  [name] gemfibrozil (LOPID) 600 MG tablet Take 1 tablet (600 mg total) by mouth 2 (two) times daily before a meal.  . hydrochlorothiazide (HYDRODIURIL) 12.5 MG tablet TAKE 1 TABLET BY MOUTH DAILY FOR BLOOD PRESSURE/EDEMA  . Insulin Aspart (NOVOLOG FLEXPEN [place]) Inject 18-24 Units into the skin 3 (three) times daily before meals.  . insulin degludec (TRESIBA) 200 UNIT/ML FlexTouch Pen Inject 70 Units into the skin at bedtime.  . Insulin Pen Needle (ADVOCATE INSULIN PEN NEEDLES) 33G X 4 MM MISC To use with insulin dosing TID prior to meals and QHS for basal insulin.  . metoprolol succinate (TOPROL-XL) 50 MG 24 hr tablet Take 1 tablet (50 mg total) by mouth daily. Take with or immediately following a meal.  . nitrofurantoin, macrocrystal-monohydrate, (MACROBID) 100 MG capsule Take 1 capsule (100 mg total) by mouth 2 (two) times daily.  [name] omeprazole (PRILOSEC) 20 MG capsule Take 20 mg by mouth daily as needed.  . ondansetron (ZOFRAN ODT) 8 MG disintegrating tablet Take 1 tablet (8 mg total) by mouth every 8 (eight) hours as needed for nausea.  Glory Rosebush DELICA LANCETS FINE MISC CHECK BLOOD SUGAR THREE TIMES A DAY  . ONETOUCH VERIO test strip CHECK BLOOD SUGAR THREE TIMES A DAY  . Pancrelipase, Lip-Prot-Amyl, (ZENPEP) 40000-126000 units CPEP Take 1 capsule by mouth with breakfast, with lunch, and with evening meal.  (Patient not taking: Reported on 05/28/2020)  . Probiotic Product (PROBIOTIC PO) Take 1 tablet by mouth daily in the afternoon.  [name] tiZANidine (ZANAFLEX) 4 MG tablet TAKE 1 TABLET(S) BY MOUTH TWICE A DAY AS NEEDED FOR MUSCLE PAIN AND SPASMS  . traMADol (ULTRAM) 50 MG tablet Take 1 tablet (50 mg total) by mouth 3 (three) times daily as needed for moderate  pain.  . [DISCONTINUED] insulin degludec (TRESIBA) 200 UNIT/ML FlexTouch Pen Inject 60 Units into the skin at bedtime.   No facility-administered encounter medications on file as of 06/14/2020.    ALLERGIES: Allergies  Allergen Reactions  . Simvastatin Diarrhea and Nausea And Vomiting  . Morphine And Related Itching  . Milk-Related Compounds Diarrhea    bloating  . Latex Rash    Rash when she wears gloves (Negative by test - per pt)    VACCINATION STATUS: Immunization History  Administered Date(s) Administered  . Influenza Inj Mdck Quad Pf 12/30/2017, 12/20/2018, 12/23/2019  . Influenza,inj,Quad PF,6+ Mos 12/30/2017  . PFIZER(Purple Top)SARS-COV-2 Vaccination 05/04/2019, 05/25/2019, 02/17/2020  . Td 04/18/2006    Diabetes She presents for her follow-up diabetic visit. She has type 2 diabetes mellitus. Her disease course has been worsening. There are no hypoglycemic associated symptoms. Pertinent negatives for hypoglycemia include no confusion, headaches, pallor or seizures. Associated symptoms include fatigue, polydipsia and polyuria. Pertinent negatives for diabetes include no chest pain and no polyphagia. There are no hypoglycemic complications. Symptoms are improving. Diabetic complications include a CVA, peripheral neuropathy and retinopathy. Risk factors for coronary artery disease include dyslipidemia, diabetes mellitus, family history, tobacco exposure, sedentary lifestyle, post-menopausal and hypertension. Current diabetic treatment includes insulin injections (She is currently on Tresiba 80 units nightly, NovoLog 1-2 times a day,  maximum 42 units daily.). Her weight is fluctuating minimally. She is following a generally unhealthy diet. When asked about meal planning, she reported none. She has not had a previous visit with a dietitian. She rarely participates in exercise. Her home blood glucose trend is fluctuating minimally. (She presents with slight improvement in her glycemic profile.  Her recent A1c was 10.6%.  She did not document any hypoglycemia.   Her last 3 measurements of A1c were 10.6, 12.5, 9.7%.  ) An ACE inhibitor/angiotensin II receptor blocker is being taken. Eye exam is current.  Hyperlipidemia This is a chronic problem. The problem is uncontrolled. Exacerbating diseases include diabetes. Pertinent negatives include no chest pain, myalgias or shortness of breath. Current antihyperlipidemic treatment includes fibric acid derivatives. Risk factors for coronary artery disease include diabetes mellitus, dyslipidemia, hypertension, a sedentary lifestyle and post-menopausal.  Hypertension This is a chronic problem. The current episode started more than 1 year ago. Pertinent negatives include no chest pain, headaches, palpitations or shortness of breath. Risk factors for coronary artery disease include dyslipidemia, diabetes mellitus, obesity, sedentary lifestyle and smoking/tobacco exposure. Past treatments include diuretics. Hypertensive end-organ damage includes CVA and retinopathy.     Review of Systems  Constitutional: Positive for fatigue. Negative for chills, fever and unexpected weight change.  HENT: Negative for trouble swallowing and voice change.   Eyes: Negative for visual disturbance.  Respiratory: Negative for cough, shortness of breath and wheezing.   Cardiovascular: Negative for chest pain, palpitations and leg swelling.  Gastrointestinal: Negative for diarrhea, nausea and vomiting.  Endocrine: Positive for polydipsia and polyuria. Negative for cold intolerance, heat intolerance and polyphagia.   Musculoskeletal: Negative for arthralgias and myalgias.  Skin: Negative for color change, pallor, rash and wound.  Neurological: Negative for seizures and headaches.  Psychiatric/Behavioral: Negative for confusion and suicidal ideas.    Objective:    Vitals with BMI 06/14/2020 06/13/2020 05/28/2020  Height 5\' 7"  5\' 7"  5\' 7"   Weight 191 lbs 5 oz 193 lbs 191 lbs 6 oz  BMI 29.95 14.97 02.63  Systolic 785 885 -  Diastolic 84 82 -  Pulse 76 79 -  Some  encounter information is confidential and restricted. Go to Review Flowsheets activity to see all data.    BP (!) 170/84   Pulse 76   Ht 5\' 7"  (1.702 m)   Wt 191 lb 4.8 oz (86.8 kg)   BMI 29.96 kg/m   Wt Readings from Last 3 Encounters:  06/14/20 191 lb 4.8 oz (86.8 kg)  06/13/20 193 lb (87.5 kg)  05/28/20 191 lb 6.4 oz (86.8 kg)     Physical Exam Constitutional:      Appearance: She is well-developed.  HENT:     Head: Normocephalic and atraumatic.  Neck:     Thyroid: No thyromegaly.     Trachea: No tracheal deviation.  Cardiovascular:     Rate and Rhythm: Normal rate and regular rhythm.  Pulmonary:     Effort: Pulmonary effort is normal.  Abdominal:     Tenderness: There is no abdominal tenderness. There is no guarding.  Musculoskeletal:        General: Normal range of motion.     Cervical back: Normal range of motion and neck supple.  Skin:    General: Skin is warm and dry.     Coloration: Skin is not pale.     Findings: No erythema or rash.  Neurological:     Mental Status: She is alert and oriented to person, place, and time.     Cranial Nerves: No cranial nerve deficit.     Coordination: Coordination normal.     Deep Tendon Reflexes: Reflexes are normal and symmetric.  Psychiatric:        Judgment: Judgment normal.       CMP ( most recent) CMP     Component Value Date/Time   NA 133 (L) 05/14/2020 1025   NA 138 07/30/2013 0436   K 4.1 05/14/2020 1025   K 4.0 07/30/2013 0436   CL 97 (L) 05/14/2020 1025    CL 103 07/30/2013 0436   CO2 26 05/14/2020 1025   CO2 26 07/30/2013 0436   GLUCOSE 281 (H) 05/14/2020 1025   GLUCOSE 109 (H) 07/30/2013 0436   BUN 15 05/14/2020 1025   BUN 15 07/30/2013 0436   CREATININE 0.60 05/14/2020 1025   CREATININE 0.67 07/30/2013 0436   CALCIUM 9.5 05/14/2020 1025   CALCIUM 8.8 07/30/2013 0436   PROT 7.9 05/14/2020 1025   PROT 7.8 07/29/2013 0650   ALBUMIN 4.0 05/14/2020 1025   ALBUMIN 3.5 07/29/2013 0650   AST 22 05/14/2020 1025   AST 25 07/29/2013 0650   ALT 20 05/14/2020 1025   ALT 38 07/29/2013 0650   ALKPHOS 66 05/14/2020 1025   ALKPHOS 83 07/29/2013 0650   BILITOT 0.8 05/14/2020 1025   BILITOT 0.2 07/29/2013 0650   GFRNONAA >60 05/14/2020 1025   GFRNONAA >60 07/30/2013 0436   GFRAA >60 02/03/2019 1126   GFRAA >60 07/30/2013 0436     Diabetic Labs (most recent): Lab Results  Component Value Date   HGBA1C 10.6 (A) 05/04/2020   HGBA1C 12.5 (A) 12/23/2019   HGBA1C 9.7 (A) 07/28/2019     Lipid Panel ( most recent) Lipid Panel     Component Value Date/Time   CHOL 360 (H) 05/14/2020 1025   CHOL 211 (H) 07/30/2013 0436   TRIG 757 (H) 05/14/2020 1025   TRIG 607 (H) 07/30/2013 0436   HDL 36 (L) 05/14/2020 1025   HDL 24 (L) 07/30/2013 0436   CHOLHDL 10.0 05/14/2020 1025   VLDL UNABLE TO CALCULATE IF TRIGLYCERIDE OVER 400 mg/dL 05/14/2020  1025   VLDL SEE COMMENT 07/30/2013 0436   LDLCALC UNABLE TO CALCULATE IF TRIGLYCERIDE OVER 400 mg/dL 05/14/2020 1025   LDLCALC SEE COMMENT 07/30/2013 0436   LDLDIRECT 177.6 (H) 05/14/2020 1025      Lab Results  Component Value Date   TSH 0.286 (L) 05/14/2020   TSH 0.789 03/26/2018   TSH 1.238 12/07/2014   TSH 3.02 08/04/2012   TSH 1.76 02/21/2011   FREET4 0.95 05/14/2020   FREET4 0.85 03/26/2018   FREET4 0.76 12/07/2014      Assessment & Plan:   1. 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  She presents with slight improvement in her glycemic profile.  Her recent A1c was 10.6%.  She did not document any hypoglycemia.   Her last 3 measurements of A1c were 10.6, 12.5, 9.7%.    Recent labs reviewed. - I had a long discussion with her about the progressive nature of diabetes and the pathology behind its complications. -her diabetes is complicated by carotid atherosclerosis, peripheral neuropathy, CVA, retinopathy, current smoking and she remains at a high risk for more acute and chronic complications which include CAD, CVA, CKD, retinopathy, and neuropathy. These are all discussed in detail with her.  - I have counseled her on diet  and weight management  by adopting a carbohydrate restricted/protein rich diet. Patient is encouraged to switch to  unprocessed or minimally processed     complex starch and increased protein intake (animal or plant source), fruits, and vegetables. -  she is advised to stick to a routine mealtimes to eat 3 meals  a day and avoid unnecessary snacks ( to snack only to correct hypoglycemia).   - she acknowledges that there is a room for improvement in her food and drink choices. - Suggestion is made for her to avoid simple carbohydrates  from her diet including Cakes, Sweet Desserts, Ice Cream, Soda (diet and regular), Sweet Tea, Candies, Chips, Cookies, Store Bought Juices, Alcohol in Excess of  1-2 drinks a day, Artificial Sweeteners,  Coffee Creamer, and "Sugar-free" Products, Lemonade. This will help patient to have more stable blood glucose profile and potentially avoid unintended weight gain.   - she will be scheduled with Jearld Fenton, RDN, CDE for diabetes education.  - I have approached her with the following individualized plan to manage  her diabetes and patient agrees:   -Based on her current and prevailing glycemic burden, she will need to continue intensive treatment with basal/bolus insulin in order for her to control diabetes to target.    -Her insurance provider coverage for freestyle libre device, she is advised to wear her CGM at all times.  She is advised to increase Tresiba to 70 units nightly, increase NovoLog to 18   units 3 times a day with meals  for pre-meal BG readings of 90-150mg /dl, plus patient specific correction dose for unexpected hyperglycemia above 150mg /dl, associated with strict monitoring of glucose 4 times a day-before meals and at bedtime. - she is warned not to take insulin without proper monitoring per orders. - Adjustment parameters are given to her for hypo and hyperglycemia in writing. - she is encouraged to call clinic for blood glucose levels less than 70 or above 200 mg /dl. - she reports that she does not tolerate Metformin, GLP-1 receptor agonists.  She has severe hypertriglyceridemia, at risk for pancreatitis from GLP-1 receptor agonist or DPP  4 inhibitors.  She so recalls she did not tolerate glipizide during prior attempt.     - Specific targets for  A1c;  LDL, HDL,  and Triglycerides were discussed with the patient.  2) Blood Pressure /Hypertension: Her blood pressure is not controlled to target. she is advised to continue her current medications including hydrochlorothiazide 12.5 mg p.o. daily, metoprolol 50 mg p.o. daily.    3) Lipids/Hyperlipidemia:   Review of her recent lipid panel showed uncontrolled glycemia 757, LDL not calculated.  She was given a prescription for gemfibrozil which she is encouraged to continue to take.  She is encouraged with her engagement with insulin treatment which will help with hypertriglyceridemia also.  She is advised to pick up gemfibrozil as well as discussed and added simvastatin 10 mg p.o. nightly.  She reports that she has used simvastatin in the past with no side effects.     4)  Weight/Diet:  Body mass index is 29.96 kg/m.  -   clearly complicating her diabetes care.   she is  a candidate for weight loss. I discussed with her the fact that loss of 5 - 10%  of her  current body weight will have the most impact on her diabetes management.  Exercise, and detailed carbohydrates information provided  -  detailed on discharge instructions.  5) Chronic Care/Health Maintenance:  -she  Is not  on ACEI/ARB and Statin medications and  is encouraged to initiate and continue to follow up with Ophthalmology, Dentist,  Podiatrist at least yearly or according to recommendations, and advised to  quit smoking. I have recommended yearly flu vaccine and pneumonia vaccine at least every 5 years; moderate intensity exercise for up to 150 minutes weekly; and  sleep for at least 7 hours a day.  - she is  advised to maintain close follow up with Lavera Guise, MD for primary care needs, as well as her other providers for optimal and coordinated care.   I spent 40 minutes in the care of the patient today including review of labs from [facility], Lipids, Thyroid Function, Hematology (current and previous including abstractions from other facilities); face-to-face time discussing  her blood glucose readings/logs, discussing hypoglycemia and hyperglycemia episodes and symptoms, medications doses, her options of short and long term treatment based on the latest standards of care / guidelines;  discussion about incorporating lifestyle medicine;  and documenting the encounter.    Please refer to Patient Instructions for Blood Glucose Monitoring and Insulin/Medications Dosing Guide"  in media tab for additional information. Please  also refer to " Patient Self Inventory" in the Media  tab for reviewed elements of pertinent patient history.  [name] participated in the discussions, expressed understanding, and voiced agreement with the above plans.  All questions were answered to her satisfaction. she is encouraged to contact clinic should she have any questions or concerns prior to her return visit.    Follow up plan: - Return in about 4 weeks (around 07/12/2020) for F/U with Meter  and Logs Only - no Labs.  Glade Lloyd, MD [facility] [address] [place], [place] [address] Phone: [phone]  Fax: [phone]    06/14/2020, 4:48 PM  This note was partially dictated with voice recognition software. Similar sounding words can be transcribed inadequately or may not  be corrected upon review.

## 2020-06-14 NOTE — Patient Instructions (Signed)

## 2020-06-17 LAB — CULTURE, URINE COMPREHENSIVE

## 2020-06-18 NOTE — Progress Notes (Signed)
Treated with Macrobid at time of visit.

## 2020-06-20 ENCOUNTER — Encounter: Payer: Self-pay | Admitting: Hospice and Palliative Medicine

## 2020-06-23 ENCOUNTER — Other Ambulatory Visit: Payer: Self-pay | Admitting: Hospice and Palliative Medicine

## 2020-06-23 DIAGNOSIS — F321 Major depressive disorder, single episode, moderate: Secondary | ICD-10-CM

## 2020-06-28 ENCOUNTER — Other Ambulatory Visit: Payer: Self-pay

## 2020-06-28 ENCOUNTER — Telehealth: Payer: Self-pay | Admitting: Nutrition

## 2020-06-28 ENCOUNTER — Encounter: Payer: Medicare Other | Attending: Nurse Practitioner | Admitting: Nutrition

## 2020-06-28 VITALS — Ht 67.0 in | Wt 190.0 lb

## 2020-06-28 DIAGNOSIS — Z794 Long term (current) use of insulin: Secondary | ICD-10-CM | POA: Diagnosis not present

## 2020-06-28 DIAGNOSIS — I10 Essential (primary) hypertension: Secondary | ICD-10-CM | POA: Diagnosis not present

## 2020-06-28 DIAGNOSIS — E1165 Type 2 diabetes mellitus with hyperglycemia: Secondary | ICD-10-CM | POA: Diagnosis not present

## 2020-06-28 DIAGNOSIS — E782 Mixed hyperlipidemia: Secondary | ICD-10-CM | POA: Diagnosis not present

## 2020-06-28 DIAGNOSIS — E669 Obesity, unspecified: Secondary | ICD-10-CM | POA: Insufficient documentation

## 2020-06-28 DIAGNOSIS — E7800 Pure hypercholesterolemia, unspecified: Secondary | ICD-10-CM | POA: Diagnosis not present

## 2020-06-28 NOTE — Telephone Encounter (Signed)
Reminder call left VM. Notified her that she could come at 230 instead of 330 if she was interested.

## 2020-06-28 NOTE — Progress Notes (Signed)
Medical Nutrition Therapy  Appointment Start time:  5697   Appointment End time:  1630  Primary concerns today: Diabetes Type 2 Referral diagnosis: E11.8, E66.9 Preferred learning style:  (auditory, visual, hands on, no preference indicated) Learning readiness: change in progress.   NUTRITION ASSESSMENT   Anthropometrics  Wt Readings from Last 3 Encounters:  06/14/20 191 lb 4.8 oz (86.8 kg)  06/13/20 193 lb (87.5 kg)  05/28/20 191 lb 6.4 oz (86.8 kg)   Ht Readings from Last 3 Encounters:  06/14/20 5\' 7"  (1.702 m)  06/13/20 5\' 7"  (1.702 m)  05/28/20 5\' 7"  (1.702 m)   There is no height or weight on file to calculate BMI. @BMIFA @ Facility age limit for growth percentiles is 20 years. Facility age limit for growth percentiles is 20 years.   Clinical Medical Hx: DM Type 2 Medications: Suppose to be getting a Risk analyst.  Tresiba 70 units at night, Novolog 18+ sliding scale with meals. Suppose to be taking pancrease with meals Labs:  Lab Results  Component Value Date   HGBA1C 10.6 (A) 05/04/2020    CMP Latest Ref Rng & Units 05/14/2020 02/03/2019 02/03/2019  Glucose 70 - 99 mg/dL 281(H) 289(H) 285(H)  BUN 6 - 20 mg/dL 15 20 17   Creatinine 0.44 - 1.00 mg/dL 0.60 0.50 0.60  Sodium 135 - 145 mmol/L 133(L) 133(L) 134(L)  Potassium 3.5 - 5.1 mmol/L 4.1 4.2 4.5  Chloride 98 - 111 mmol/L 97(L) 96(L) 95(L)  CO2 22 - 32 mmol/L 26 - 26  Calcium 8.9 - 10.3 mg/dL 9.5 - 9.4  Total Protein 6.5 - 8.1 g/dL 7.9 - 7.7  Total Bilirubin 0.3 - 1.2 mg/dL 0.8 - 1.1  Alkaline Phos 38 - 126 U/L 66 - 75  AST 15 - 41 U/L 22 - 41  ALT 0 - 44 U/L 20 - 30   Lipid Panel     Component Value Date/Time   CHOL 360 (H) 05/14/2020 1025   CHOL 211 (H) 07/30/2013 0436   TRIG 757 (H) 05/14/2020 1025   TRIG 607 (H) 07/30/2013 0436   HDL 36 (L) 05/14/2020 1025   HDL 24 (L) 07/30/2013 0436   CHOLHDL 10.0 05/14/2020 1025   VLDL UNABLE TO CALCULATE IF TRIGLYCERIDE OVER 400 mg/dL 05/14/2020 1025   VLDL SEE  COMMENT 07/30/2013 0436   LDLCALC UNABLE TO CALCULATE IF TRIGLYCERIDE OVER 400 mg/dL 05/14/2020 1025   LDLCALC SEE COMMENT 07/30/2013 0436   LDLDIRECT 177.6 (H) 05/14/2020 1025   Sees Dr. Dorris Fetch, Endocrinology.  Notable Signs/Symptoms: Fatigue, blurry vision, increased thirst, frequent urination, craving for carbs.   Lifestyle & Dietary Hx Admits to previously not taking medications and eating wrong foods. Willing to make changes.  Testing blood sugars more often but still mises quite a few times.   Estimated daily fluid intake: 24 oz Supplements:  Sleep: 6 Stress / self-care: life Current average weekly physical activity: ADL   24-Hr Dietary Recall First Meal: Sausage egg burrito or oatmeal, cofffee- sf creamer, water Snack:  Second Meal: Chicken sandwich on white , unsweet tea Snack: crunchy chips  Third Meal: wasn't hungry Snack:  Beverages: misc water, tea, diet sodas.  Estimated Energy Needs Calories: 1200 Carbohydrate: 135g Protein: 90g Fat: 35g   NUTRITION DIAGNOSIS  NB-1.1 Food and nutrition-related knowledge deficit As related to Diabetes Type 2.  As evidenced by A1C 10.6%.   NUTRITION INTERVENTION  Nutrition education (E-1) on the following topics:  . Nutrition and Diabetes education provided on My Plate, CHO counting,  meal planning, portion sizes, timing of meals, avoiding snacks between meals unless having a low blood sugar, target ranges for A1C and blood sugars, signs/symptoms and treatment of hyper/hypoglycemia, monitoring blood sugars, taking medications as prescribed, benefits of exercising 30 minutes per day and prevention of complications of DM. [name]   Handouts Provided Include   The Plate Method   Meal Plan card  Diabetes Instructions   Learning Style & Readiness for Change Teaching method utilized: Visual & Auditory  Demonstrated degree of understanding via: Teach Back  Barriers to learning/adherence to lifestyle change:  motivation/depression  Goals Established by Pt Follow My Plate Eat three meals on time Avoid snacks Cut out processed foods. Take insulin with meal and at night as prescribed. Test blood sugars 4 times per day and record on logs. Bring to all appointments. Exercise 30 minutes every day Get FBS less than 130 and less than 180 at bedtime. Don't skip meals. Drink only water or unsweet tea .    MONITORING & EVALUATION Dietary intake, weekly physical activity, and blood sugars in 1 month.  Next Steps  Patient is to work on better meal planning.[name]

## 2020-07-03 ENCOUNTER — Other Ambulatory Visit: Payer: Self-pay

## 2020-07-03 DIAGNOSIS — E1142 Type 2 diabetes mellitus with diabetic polyneuropathy: Secondary | ICD-10-CM

## 2020-07-03 MED ORDER — FREESTYLE LIBRE 2 SENSOR MISC: 3 refills | Status: DC

## 2020-07-09 ENCOUNTER — Encounter: Payer: Self-pay | Admitting: Nutrition

## 2020-07-09 ENCOUNTER — Other Ambulatory Visit: Payer: Self-pay | Admitting: Internal Medicine

## 2020-07-09 ENCOUNTER — Telehealth: Payer: Self-pay

## 2020-07-09 DIAGNOSIS — E1142 Type 2 diabetes mellitus with diabetic polyneuropathy: Secondary | ICD-10-CM

## 2020-07-09 DIAGNOSIS — M064 Inflammatory polyarthropathy: Secondary | ICD-10-CM

## 2020-07-09 MED ORDER — TRAMADOL HCL 50 MG PO TABS
50.0000 mg | ORAL_TABLET | Freq: Three times a day (TID) | ORAL | 0 refills | Status: DC | PRN
Start: 2020-07-09 — End: 2020-08-28

## 2020-07-09 NOTE — Patient Instructions (Signed)
Follow My Plate Eat three meals on time Avoid snacks Cut out processed foods. Take insulin with meal and at night as prescribed. Test blood sugars 4 times per day and record on logs. Bring to all appointments. Exercise 30 minutes every day Get FBS less than 130 and less than 180 at bedtime. Don't skip meals. Drink only water or unsweet tea

## 2020-07-10 ENCOUNTER — Other Ambulatory Visit: Payer: Self-pay

## 2020-07-10 DIAGNOSIS — E781 Pure hyperglyceridemia: Secondary | ICD-10-CM

## 2020-07-10 MED ORDER — GEMFIBROZIL 600 MG PO TABS: 600.0000 mg | ORAL_TABLET | Freq: Two times a day (BID) | ORAL | 1 refills | Status: DC

## 2020-07-10 NOTE — Telephone Encounter (Signed)
Medication was sent to pharmacy.

## 2020-07-11 ENCOUNTER — Telehealth: Payer: Self-pay | Admitting: "Endocrinology

## 2020-07-11 DIAGNOSIS — E1142 Type 2 diabetes mellitus with diabetic polyneuropathy: Secondary | ICD-10-CM

## 2020-07-11 MED ORDER — NOVOLOG FLEXPEN 100 UNIT/ML ~~LOC~~ SOPN: 18.0000 [IU] | PEN_INJECTOR | Freq: Three times a day (TID) | SUBCUTANEOUS | 1 refills | Status: DC

## 2020-07-11 NOTE — Telephone Encounter (Signed)
Rx sent 

## 2020-07-11 NOTE — Telephone Encounter (Signed)
Pt is calling and states that she is needing her Novolog refilled but the pharmacy keeps sending the request to her pcp. She is requesting [place] to refill.  [id number] [place], [place] - [address] Phone:  [phone]  Fax:  [phone]

## 2020-07-12 ENCOUNTER — Ambulatory Visit: Payer: Medicare Other | Admitting: "Endocrinology

## 2020-07-20 ENCOUNTER — Other Ambulatory Visit: Payer: Self-pay

## 2020-07-20 ENCOUNTER — Telehealth: Payer: Self-pay

## 2020-07-20 DIAGNOSIS — R300 Dysuria: Secondary | ICD-10-CM

## 2020-07-20 MED ORDER — FLUCONAZOLE 150 MG PO TABS: ORAL_TABLET | ORAL | 0 refills | Status: DC

## 2020-07-23 NOTE — Telephone Encounter (Signed)
Diflucan was sent

## 2020-07-25 ENCOUNTER — Other Ambulatory Visit: Payer: Self-pay | Admitting: Nurse Practitioner

## 2020-07-25 DIAGNOSIS — M7540 Impingement syndrome of unspecified shoulder: Secondary | ICD-10-CM

## 2020-07-26 ENCOUNTER — Ambulatory Visit: Payer: Medicare Other | Admitting: Physician Assistant

## 2020-08-02 ENCOUNTER — Encounter: Payer: Self-pay | Admitting: "Endocrinology

## 2020-08-02 ENCOUNTER — Encounter: Payer: Self-pay | Admitting: Nutrition

## 2020-08-02 ENCOUNTER — Encounter: Payer: Medicare Other | Attending: "Endocrinology | Admitting: Nutrition

## 2020-08-02 ENCOUNTER — Ambulatory Visit (INDEPENDENT_AMBULATORY_CARE_PROVIDER_SITE_OTHER): Payer: Medicare Other | Admitting: "Endocrinology

## 2020-08-02 ENCOUNTER — Other Ambulatory Visit: Payer: Self-pay

## 2020-08-02 VITALS — BP 159/83 | HR 86 | Ht 67.0 in | Wt 191.8 lb

## 2020-08-02 DIAGNOSIS — I10 Essential (primary) hypertension: Secondary | ICD-10-CM | POA: Insufficient documentation

## 2020-08-02 DIAGNOSIS — E669 Obesity, unspecified: Secondary | ICD-10-CM | POA: Insufficient documentation

## 2020-08-02 DIAGNOSIS — E1142 Type 2 diabetes mellitus with diabetic polyneuropathy: Secondary | ICD-10-CM | POA: Diagnosis not present

## 2020-08-02 DIAGNOSIS — E1165 Type 2 diabetes mellitus with hyperglycemia: Secondary | ICD-10-CM | POA: Insufficient documentation

## 2020-08-02 DIAGNOSIS — E782 Mixed hyperlipidemia: Secondary | ICD-10-CM | POA: Insufficient documentation

## 2020-08-02 DIAGNOSIS — Z794 Long term (current) use of insulin: Secondary | ICD-10-CM | POA: Insufficient documentation

## 2020-08-02 LAB — POCT GLYCOSYLATED HEMOGLOBIN (HGB A1C): HbA1c, POC (controlled diabetic range): 10.9 % — AB (ref 0.0–7.0)

## 2020-08-02 MED ORDER — NOVOLOG FLEXPEN 100 UNIT/ML ~~LOC~~ SOPN: 20.0000 [IU] | PEN_INJECTOR | Freq: Three times a day (TID) | SUBCUTANEOUS | 2 refills | Status: DC

## 2020-08-02 MED ORDER — HYDROCHLOROTHIAZIDE 25 MG PO TABS: ORAL_TABLET | ORAL | 1 refills | Status: DC

## 2020-08-02 MED ORDER — INSULIN DEGLUDEC 200 UNIT/ML ~~LOC~~ SOPN: 80.0000 [IU] | PEN_INJECTOR | Freq: Every day | SUBCUTANEOUS | 2 refills | Status: DC

## 2020-08-02 NOTE — Progress Notes (Signed)
Medical Nutrition Therapy  Follow up  Appointment Start time:  1430   Appointment End time:  1500  Primary concerns today: Diabetes Type 2 Referral diagnosis: E11.8, E66.9 Preferred learning style:   no preference indicated Learning readiness: contemplating. No real changes.   NUTRITION ASSESSMENT  Changes made: She notes she hasn't made many changes since last visit. A1C 10.9%. Has low energy. Not motivated to cook or be active. Admits to being mildly depressed. Stressed about family issues and life in general. Has a lot of arthritis that limits her activity due to pain. Morgan Daniels shows her BS are consistently high; only 1% in range. She notes she usually takes her bedtime insulin but often forgets her meal time insulin because she never has her stuff when she needs it.  Hasn't been following sliding scale due to lack of testing blood sugars. She randomly would guessimate how much insulin she decided to give herself depeding how she felt. If she felt ok, she notes she wouldn't take her insulin.  Saw Dr. Dorris Daniels today. To increase her Tresiba to 80 units and Novolog to 20 units with meals plus sliding scale.  Anthropometrics  Wt Readings from Last 3 Encounters:  08/02/20 191 lb 12.8 oz (87 kg)  06/28/20 190 lb (86.2 kg)  06/14/20 191 lb 4.8 oz (86.8 kg)   Ht Readings from Last 3 Encounters:  08/02/20 5\' 7"  (1.702 m)  06/28/20 5\' 7"  (1.702 m)  06/14/20 5\' 7"  (1.702 m)   There is no height or weight on file to calculate BMI. @BMIFA @ Facility age limit for growth percentiles is 20 years. Facility age limit for growth percentiles is 20 years.   Clinical Medical Hx: DM Type 2 Medications: Has LIbre. Tresiba increased to 80 units at night, Novolog 20 units +  sliding scale with meals. Suppose to be taking pancrease with meals Labs:  Lab Results  Component Value Date   HGBA1C 10.9 (A) 08/02/2020    CMP Latest Ref Rng & Units 05/14/2020 02/03/2019 02/03/2019  Glucose 70 - 99 mg/dL  281(H) 289(H) 285(H)  BUN 6 - 20 mg/dL 15 20 17   Creatinine 0.44 - 1.00 mg/dL 0.60 0.50 0.60  Sodium 135 - 145 mmol/L 133(L) 133(L) 134(L)  Potassium 3.5 - 5.1 mmol/L 4.1 4.2 4.5  Chloride 98 - 111 mmol/L 97(L) 96(L) 95(L)  CO2 22 - 32 mmol/L 26 - 26  Calcium 8.9 - 10.3 mg/dL 9.5 - 9.4  Total Protein 6.5 - 8.1 g/dL 7.9 - 7.7  Total Bilirubin 0.3 - 1.2 mg/dL 0.8 - 1.1  Alkaline Phos 38 - 126 U/L 66 - 75  AST 15 - 41 U/L 22 - 41  ALT 0 - 44 U/L 20 - 30   Lipid Panel     Component Value Date/Time   CHOL 360 (H) 05/14/2020 1025   CHOL 211 (H) 07/30/2013 0436   TRIG 757 (H) 05/14/2020 1025   TRIG 607 (H) 07/30/2013 0436   HDL 36 (L) 05/14/2020 1025   HDL 24 (L) 07/30/2013 0436   CHOLHDL 10.0 05/14/2020 1025   VLDL UNABLE TO CALCULATE IF TRIGLYCERIDE OVER 400 mg/dL 05/14/2020 1025   VLDL SEE COMMENT 07/30/2013 0436   LDLCALC UNABLE TO CALCULATE IF TRIGLYCERIDE OVER 400 mg/dL 05/14/2020 1025   LDLCALC SEE COMMENT 07/30/2013 0436   LDLDIRECT 177.6 (H) 05/14/2020 1025   Sees Dr. Dorris Daniels, Endocrinology.  Notable Signs/Symptoms: Fatigue, blurry vision, increased thirst, frequent urination, craving for carbs.   Lifestyle & Dietary Hx Admits to previously  not taking medications and eating wrong foods. Willing to make changes.  Testing blood sugars more often but still mises quite a few times.   Estimated daily fluid intake: 24 oz Supplements:  Sleep: 6 Stress / self-care: life Current average weekly physical activity: ADL   24-Hr Dietary Recall First Meal: 3 eggs, salsa on low carb wrap Snack:  Second Meal: Chicken sandwich on white , unsweet tea Snack: crunchy chips  Third Meal: wasn't hungry Snack:  Beverages: misc water, tea, diet sodas.  Estimated Energy Needs Calories: 1200 Carbohydrate: 135g Protein: 90g Fat: 35g   NUTRITION DIAGNOSIS  NB-1.1 Food and nutrition-related knowledge deficit As related to Diabetes Type 2.  As evidenced by A1C 10.6%.   NUTRITION  INTERVENTION  Nutrition education (E-1) on the following topics:  Nutrition and Diabetes education provided on My Plate, CHO counting, meal planning, portion sizes, timing of meals, avoiding snacks between meals unless having a low blood sugar, target ranges for A1C and blood sugars, signs/symptoms and treatment of hyper/hypoglycemia, monitoring blood sugars, taking medications as prescribed, benefits of exercising 30 minutes per day and prevention of complications of DM.   Handouts Provided Include  The Plate Method  Meal Plan card Diabetes Instructions   Learning Style & Readiness for Change Teaching method utilized: Visual & Auditory  Demonstrated degree of understanding via: Teach Back  Barriers to learning/adherence to lifestyle change: motivation/depression  Goals Established by Pt  Goals  Set alarms for meal times Eat meals on time Take insulin as prescribed before meals. Increase water intake Eat 2-3 c arb choices per meal  MONITORING & EVALUATION Dietary intake, weekly physical activity, and blood sugars in 1 month.  Next Steps  Patient is to work on better meal planning..  She may benefit from referral to mental health counseling and better management of her depression.

## 2020-08-02 NOTE — Patient Instructions (Signed)
Goals  Set alarms for meal times Eat meals on time Take insulin as prescribed before meals. Increase water intake Eat 2-3 c arb choices per meal

## 2020-08-02 NOTE — Patient Instructions (Signed)

## 2020-08-02 NOTE — Progress Notes (Signed)
08/02/2020, 5:34 PM   Endocrinology follow-up note  Subjective:    Patient ID: Morgan Daniels, female    DOB: September 28, 1960.  Morgan Daniels is being seen in follow-up after she was seen in consultation for management of currently uncontrolled symptomatic diabetes requested by  Lavera Guise, MD.   Past Medical History:  Diagnosis Date   Anxiety    Arthritis    Carotid artery occlusion    Chronic kidney disease May 2017   UTI   Depression    Diabetes ([place])    Diverticulosis    Fatty liver    Fibromyalgia    GERD (gastroesophageal reflux disease)    H/O hiatal hernia    Hypertension    IBS (irritable bowel syndrome)    Peptic ulcer    Plantar fasciitis    Stroke [facility]) Dec. 14,2013   Right side-ministroke    Past Surgical History:  Procedure Laterality Date   [place]  2000, 2004   CAROTID ENDARTERECTOMY Left 05/06/12   CARPAL TUNNEL RELEASE Left 07/18/2015   Procedure: CARPAL TUNNEL RELEASE;  Surgeon: Earnestine Leys, MD;  Location: [facility] ORS;  Service: Orthopedics;  Laterality: Left;   CEREBRAL ANGIOGRAM Bilateral 05/03/2012   Procedure: CEREBRAL ANGIOGRAM;  Surgeon: Angelia Mould, MD;  Location: [facility] CATH LAB;  Service: Cardiovascular;  Laterality: Bilateral;   CHOLECYSTECTOMY  2001   COLONOSCOPY WITH PROPOFOL N/A 11/19/2018   Procedure: COLONOSCOPY WITH PROPOFOL;  Surgeon: Lucilla Lame, MD;  Location: [place];  Service: Endoscopy;  Laterality: N/A;  Diabetic - insulin   ENDARTERECTOMY Left 05/06/2012   Procedure: ENDARTERECTOMY CAROTID;  Surgeon: Angelia Mould, MD;  Location: [place];  Service: Vascular;  Laterality: Left;   ENDARTERECTOMY Right 08/09/2013   Procedure: ENDARTERECTOMY CAROTID-RIGHT;  Surgeon: Angelia Mould, MD;  Location: [place];  Service: Vascular;  Laterality: Right;   HERNIA REPAIR     PATCH ANGIOPLASTY  Left 05/06/2012   Procedure: WITH DACRON PATCH ANGIOPLASTY ;  Surgeon: Angelia Mould, MD;  Location: [place];  Service: Vascular;  Laterality: Left;   POLYPECTOMY  11/19/2018   Procedure: POLYPECTOMY;  Surgeon: Lucilla Lame, MD;  Location: [place];  Service: Endoscopy;;   SPINE SURGERY  2004   TONSILLECTOMY     TUBAL LIGATION      Social History   Socioeconomic History   Marital status: Married    Spouse name: Not on file   Number of children: Not on file   Years of education: Not on file   Highest education level: Not on file  Occupational History   Not on file  Tobacco Use   Smoking status: Every Day    Packs/day: 1.00    Years: 40.00    Pack years: 40.00    Types: Cigarettes    Start date: 11/09/1970   Smokeless tobacco: Never   Tobacco comments:       Vaping Use   Vaping Use: Former  Substance and Sexual Activity   Alcohol use: Yes    Alcohol/week: 0.0 standard drinks  Comment: rarely   Drug use: No   Sexual activity: Not Currently    Partners: Male  Other Topics Concern   Not on file  Social History Narrative   Not on file   Social Determinants of Health   Financial Resource Strain: Not on file  Food Insecurity: Not on file  Transportation Needs: Not on file  Physical Activity: Not on file  Stress: Not on file  Social Connections: Not on file    Family History  Problem Relation Age of Onset   Hypertension Mother    Hyperlipidemia Mother    Deep vein thrombosis Mother    Cancer Father    Alcohol abuse Father    Heart failure Father    Hypertension Maternal Grandmother    Deep vein thrombosis Sister    Alcohol abuse Sister    Alcohol abuse Sister     Outpatient Encounter Medications as of 08/02/2020  Medication Sig   Cholecalciferol (VITAMIN D-3) 5000 UNITS TABS Take 5,000 Units by mouth daily.    Continuous Blood Gluc Receiver (FREESTYLE LIBRE 2 READER) DEVI As directed   Continuous Blood Gluc Sensor (FREESTYLE LIBRE 2 SENSOR)  MISC Use to test BG qid. Dx E11.65   diphenoxylate-atropine (LOMOTIL) 2.5-0.025 MG tablet Take 1 tablet by mouth 3 (three) times daily as needed for diarrhea or loose stools.   fluconazole (DIFLUCAN) 150 MG tablet Take 1 tab po once daily   FLUoxetine (PROZAC) 40 MG capsule TAKE 2 CAPSULES BY MOUTH DAILY   gabapentin (NEURONTIN) 600 MG tablet TAKE 1 TABLET BY MOUTH THREE TIMES A DAY.   gemfibrozil (LOPID) 600 MG tablet Take 1 tablet (600 mg total) by mouth 2 (two) times daily before a meal.   hydrochlorothiazide (HYDRODIURIL) 12.5 MG tablet TAKE 1 TABLET BY MOUTH DAILY FOR BLOOD PRESSURE/EDEMA   insulin aspart (NOVOLOG FLEXPEN) 100 UNIT/ML FlexPen Inject 20-26 Units into the skin 3 (three) times daily before meals.   insulin degludec (TRESIBA) 200 UNIT/ML FlexTouch Pen Inject 80 Units into the skin at bedtime.   Insulin Pen Needle (ADVOCATE INSULIN PEN NEEDLES) 33G X 4 MM MISC To use with insulin dosing TID prior to meals and QHS for basal insulin.   metoprolol succinate (TOPROL-XL) 50 MG 24 hr tablet Take 1 tablet (50 mg total) by mouth daily. Take with or immediately following a meal.   nitrofurantoin, macrocrystal-monohydrate, (MACROBID) 100 MG capsule Take 1 capsule (100 mg total) by mouth 2 (two) times daily.   omeprazole (PRILOSEC) 20 MG capsule Take 20 mg by mouth daily as needed.   ondansetron (ZOFRAN ODT) 8 MG disintegrating tablet Take 1 tablet (8 mg total) by mouth every 8 (eight) hours as needed for nausea.   ONETOUCH DELICA LANCETS FINE MISC CHECK BLOOD SUGAR THREE TIMES A DAY   ONETOUCH VERIO test strip CHECK BLOOD SUGAR THREE TIMES A DAY   Probiotic Product (PROBIOTIC PO) Take 1 tablet by mouth daily in the afternoon.   tiZANidine (ZANAFLEX) 4 MG tablet TAKE 1 TABLET(S) BY MOUTH TWICE A DAY AS NEEDED FOR MUSCLE PAIN AND SPASMS   traMADol (ULTRAM) 50 MG tablet Take 1 tablet (50 mg total) by mouth 3 (three) times daily as needed for moderate pain or severe pain.   [DISCONTINUED]  insulin aspart (NOVOLOG FLEXPEN) 100 UNIT/ML FlexPen Inject 18-24 Units into the skin 3 (three) times daily before meals.   [DISCONTINUED] insulin degludec (TRESIBA) 200 UNIT/ML FlexTouch Pen Inject 70 Units into the skin at bedtime.   [DISCONTINUED] Pancrelipase, Lip-Prot-Amyl, (  ZENPEP) 40000-126000 units CPEP Take 1 capsule by mouth with breakfast, with lunch, and with evening meal. (Patient not taking: Reported on 05/28/2020)   No facility-administered encounter medications on file as of 08/02/2020.    ALLERGIES: Allergies  Allergen Reactions   Simvastatin Diarrhea and Nausea And Vomiting   Morphine And Related Itching   Milk-Related Compounds Diarrhea    bloating   Latex Rash    Rash when she wears gloves (Negative by test - per pt)    VACCINATION STATUS: Immunization History  Administered Date(s) Administered   Influenza Inj Mdck Quad Pf 12/30/2017, 12/20/2018, 12/23/2019   Influenza,inj,Quad PF,6+ Mos 12/30/2017   PFIZER(Purple Top)SARS-COV-2 Vaccination 05/04/2019, 05/25/2019, 02/17/2020   Td 04/18/2006    Diabetes She presents for her follow-up diabetic visit. She has type 2 diabetes mellitus. Her disease course has been worsening. There are no hypoglycemic associated symptoms. Pertinent negatives for hypoglycemia include no confusion, headaches, pallor or seizures. Associated symptoms include fatigue, polydipsia and polyuria. Pertinent negatives for diabetes include no chest pain and no polyphagia. There are no hypoglycemic complications. Symptoms are worsening. Diabetic complications include a CVA, peripheral neuropathy and retinopathy. Risk factors for coronary artery disease include dyslipidemia, diabetes mellitus, family history, tobacco exposure, sedentary lifestyle, post-menopausal and hypertension. Current diabetic treatment includes insulin injections (She is currently on Tresiba 80 units nightly, NovoLog 1-2 times a day, maximum 42 units daily.). Her weight is fluctuating  minimally. She is following a generally unhealthy diet. When asked about meal planning, she reported none. She has had a previous visit with a dietitian. She rarely participates in exercise. Her home blood glucose trend is fluctuating minimally. Her breakfast blood glucose range is generally >200 mg/dl. Her lunch blood glucose range is generally >200 mg/dl. Her dinner blood glucose range is generally >200 mg/dl. Her bedtime blood glucose range is generally >200 mg/dl. Her overall blood glucose range is >200 mg/dl. (She presents with continued, severe hyperglycemia with point-of-care A1c of 10.9% today.  Her CGM AGP report shows 1% time range, 99% above range.  She has no hypoglycemia.  Her average blood glucose is 279 for the last 14 days.   Her last 3 measurements of A1c were   10.9, 10.6, 12.5, 9.7%.  ) An ACE inhibitor/angiotensin II receptor blocker is being taken. Eye exam is current.  Hyperlipidemia This is a chronic problem. The problem is uncontrolled. Exacerbating diseases include diabetes. Pertinent negatives include no chest pain, myalgias or shortness of breath. Current antihyperlipidemic treatment includes fibric acid derivatives. Risk factors for coronary artery disease include diabetes mellitus, dyslipidemia, hypertension, a sedentary lifestyle and post-menopausal.  Hypertension This is a chronic problem. The current episode started more than 1 year ago. Pertinent negatives include no chest pain, headaches, palpitations or shortness of breath. Risk factors for coronary artery disease include dyslipidemia, diabetes mellitus, obesity, sedentary lifestyle and smoking/tobacco exposure. Past treatments include diuretics. Hypertensive end-organ damage includes CVA and retinopathy.    Review of Systems  Constitutional:  Positive for fatigue. Negative for chills, fever and unexpected weight change.  HENT:  Negative for trouble swallowing and voice change.   Eyes:  Negative for visual disturbance.   Respiratory:  Negative for cough, shortness of breath and wheezing.   Cardiovascular:  Negative for chest pain, palpitations and leg swelling.  Gastrointestinal:  Negative for diarrhea, nausea and vomiting.  Endocrine: Positive for polydipsia and polyuria. Negative for cold intolerance, heat intolerance and polyphagia.  Musculoskeletal:  Negative for arthralgias and myalgias.  Skin:  Negative for  color change, pallor, rash and wound.  Neurological:  Negative for seizures and headaches.  Psychiatric/Behavioral:  Negative for confusion and suicidal ideas.    Objective:    Vitals with BMI 08/02/2020 06/28/2020 06/14/2020  Height 5\' 7"  5\' 7"  5\' 7"   Weight 191 lbs 13 oz 190 lbs 191 lbs 5 oz  BMI 30.03 32.35 57.32  Systolic 202 - 542  Diastolic 83 - 84  Pulse 86 - 76  Some encounter information is confidential and restricted. Go to Review Flowsheets activity to see all data.    BP (!) 159/83   Pulse 86   Ht 5\' 7"  (1.702 m)   Wt 191 lb 12.8 oz (87 kg)   BMI 30.04 kg/m   Wt Readings from Last 3 Encounters:  08/02/20 191 lb 12.8 oz (87 kg)  06/28/20 190 lb (86.2 kg)  06/14/20 191 lb 4.8 oz (86.8 kg)     Physical Exam Constitutional:      Appearance: She is well-developed.  HENT:     Head: Normocephalic and atraumatic.  Neck:     Thyroid: No thyromegaly.     Trachea: No tracheal deviation.  Cardiovascular:     Rate and Rhythm: Normal rate and regular rhythm.  Pulmonary:     Effort: Pulmonary effort is normal.  Abdominal:     Tenderness: There is no abdominal tenderness. There is no guarding.  Musculoskeletal:        General: Normal range of motion.     Cervical back: Normal range of motion and neck supple.  Skin:    General: Skin is warm and dry.     Coloration: Skin is not pale.     Findings: No erythema or rash.  Neurological:     Mental Status: She is alert and oriented to person, place, and time.     Cranial Nerves: No cranial nerve deficit.     Coordination:  Coordination normal.     Deep Tendon Reflexes: Reflexes are normal and symmetric.  Psychiatric:        Judgment: Judgment normal.      CMP ( most recent) CMP     Component Value Date/Time   NA 133 (L) 05/14/2020 1025   NA 138 07/30/2013 0436   K 4.1 05/14/2020 1025   K 4.0 07/30/2013 0436   CL 97 (L) 05/14/2020 1025   CL 103 07/30/2013 0436   CO2 26 05/14/2020 1025   CO2 26 07/30/2013 0436   GLUCOSE 281 (H) 05/14/2020 1025   GLUCOSE 109 (H) 07/30/2013 0436   BUN 15 05/14/2020 1025   BUN 15 07/30/2013 0436   CREATININE 0.60 05/14/2020 1025   CREATININE 0.67 07/30/2013 0436   CALCIUM 9.5 05/14/2020 1025   CALCIUM 8.8 07/30/2013 0436   PROT 7.9 05/14/2020 1025   PROT 7.8 07/29/2013 0650   ALBUMIN 4.0 05/14/2020 1025   ALBUMIN 3.5 07/29/2013 0650   AST 22 05/14/2020 1025   AST 25 07/29/2013 0650   ALT 20 05/14/2020 1025   ALT 38 07/29/2013 0650   ALKPHOS 66 05/14/2020 1025   ALKPHOS 83 07/29/2013 0650   BILITOT 0.8 05/14/2020 1025   BILITOT 0.2 07/29/2013 0650   GFRNONAA >60 05/14/2020 1025   GFRNONAA >60 07/30/2013 0436   GFRAA >60 02/03/2019 1126   GFRAA >60 07/30/2013 0436     Diabetic Labs (most recent): Lab Results  Component Value Date   HGBA1C 10.9 (A) 08/02/2020   HGBA1C 10.6 (A) 05/04/2020   HGBA1C 12.5 (A) 12/23/2019  Lipid Panel ( most recent) Lipid Panel     Component Value Date/Time   CHOL 360 (H) 05/14/2020 1025   CHOL 211 (H) 07/30/2013 0436   TRIG 757 (H) 05/14/2020 1025   TRIG 607 (H) 07/30/2013 0436   HDL 36 (L) 05/14/2020 1025   HDL 24 (L) 07/30/2013 0436   CHOLHDL 10.0 05/14/2020 1025   VLDL UNABLE TO CALCULATE IF TRIGLYCERIDE OVER 400 mg/dL 05/14/2020 1025   VLDL SEE COMMENT 07/30/2013 0436   LDLCALC UNABLE TO CALCULATE IF TRIGLYCERIDE OVER 400 mg/dL 05/14/2020 1025   LDLCALC SEE COMMENT 07/30/2013 0436   LDLDIRECT 177.6 (H) 05/14/2020 1025      Lab Results  Component Value Date   TSH 0.286 (L) 05/14/2020   TSH 0.789  03/26/2018   TSH 1.238 12/07/2014   TSH 3.02 08/04/2012   TSH 1.76 02/21/2011   FREET4 0.95 05/14/2020   FREET4 0.85 03/26/2018   FREET4 0.76 12/07/2014      Assessment & Plan:   1. 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  She presents with continued, severe hyperglycemia with point-of-care A1c of 10.9% today.  Her CGM AGP report shows 1% time range, 99% above range.  She has no hypoglycemia.  Her average blood glucose is 279 for the last 14 days.   Her last 3 measurements of A1c were   10.9, 10.6, 12.5, 9.7%.      Recent labs reviewed. - I had a long discussion with her about the progressive nature of diabetes and the pathology behind its complications. -her diabetes is complicated by carotid atherosclerosis, peripheral neuropathy, CVA, retinopathy, current smoking and she remains at a high risk for more acute and chronic complications which include CAD, CVA, CKD, retinopathy, and neuropathy. These are all discussed in detail with her.  - I have counseled her on diet  and weight management  by adopting a carbohydrate restricted/protein rich diet. Patient is encouraged to switch to  unprocessed or minimally processed     complex starch and increased protein intake (animal or plant source), fruits, and vegetables. -  she is advised to stick to a routine mealtimes to eat 3 meals  a day and avoid unnecessary snacks ( to snack only to correct hypoglycemia).   - she acknowledges that there is a room for improvement in her food and drink choices. - Suggestion is made for her to avoid simple carbohydrates  from her diet including Cakes, Sweet Desserts, Ice Cream, Soda (diet and regular), Sweet Tea, Candies, Chips, Cookies, Store Bought Juices, Alcohol in Excess of  1-2 drinks a day, Artificial Sweeteners,  Coffee Creamer, and "Sugar-free" Products, Lemonade. This will help patient to have more  stable blood glucose profile and potentially avoid unintended weight gain.   - she will be scheduled with Jearld Fenton, RDN, CDE for diabetes education.  - I have approached her with the following individualized plan to manage  her diabetes and patient agrees:   -Based on her current and prevailing glycemic burden, she will need to continue on intensive treatment with basal/bolus insulin.    Admittedly, she is missing at least a third of her insulin injection opportunities.  Consistency is more important than increasing her insulin doses.    -She will see her dietitian today.  She is approached to engage better for injections.  I discussed and increase her Tresiba to 80 units nightly, slightly increase  NovoLog to 20    units 3 times a day with meals  for pre-meal BG readings of 90-150mg /dl, plus patient specific correction dose for unexpected hyperglycemia above 150mg /dl, associated with strict monitoring of glucose 4 times a day-before meals and at bedtime. - she is warned not to take insulin without proper monitoring per orders. - Adjustment parameters are given to her for hypo and hyperglycemia in writing. - she is encouraged to call clinic for blood glucose levels less than 70 or above 200 mg /dl. - she reports that she does not tolerate Metformin, GLP-1 receptor agonists.  She has severe hypertriglyceridemia, at risk for pancreatitis from GLP-1 receptor agonist or DPP 4 inhibitors.  She so recalls she did not tolerate glipizide during prior attempt.     - Specific targets for  A1c;  LDL, HDL,  and Triglycerides were discussed with the patient.  2) Blood Pressure /Hypertension:  Her blood pressure is not controlled to target.  she is advised to continue her current medications including hydrochlorothiazide 12.5 mg p.o. daily , metoprolol 50 mg p.o. daily.  I will increase her hydrochlorothiazide to 25 mg for next refill.  3) Lipids/Hyperlipidemia:   Review of her recent lipid panel  showed uncontrolled glycemia 757, LDL not calculated.  She was given a prescription for gemfibrozil which she is encouraged to continue to take.  She is encouraged with her engagement with insulin treatment which will help with hypertriglyceridemia also.  She is advised to pick up gemfibrozil 600 mg p.o. twice daily.    Simvastatin is listed as one of her allergies.      4)  Weight/Diet:  Body mass index is 30.04 kg/m.  -   clearly complicating her diabetes care.   she is  a candidate for weight loss. I discussed with her the fact that loss of 5 - 10% of her  current body weight will have the most impact on her diabetes management.  Exercise, and detailed carbohydrates information provided  -  detailed on discharge instructions.  5) Chronic Care/Health Maintenance:  -she  Is not  on ACEI/ARB and Statin medications and  is encouraged to initiate and continue to follow up with Ophthalmology, Dentist,  Podiatrist at least yearly or according to recommendations, and advised to  quit smoking. I have recommended yearly flu vaccine and pneumonia vaccine at least every 5 years; moderate intensity exercise for up to 150 minutes weekly; and  sleep for at least 7 hours a day.  - she is  advised to maintain close follow up with Lavera Guise, MD for primary care needs, as well as her other providers for optimal and coordinated care.     I spent 40 minutes in the care of the patient today including review of labs from [place], Lipids, Thyroid Function, Hematology (current and previous including abstractions from other facilities); face-to-face time discussing  her blood glucose readings/logs, discussing hypoglycemia and hyperglycemia episodes and symptoms, medications doses, her options of short and long term treatment based on the latest standards of care / guidelines;  discussion about incorporating lifestyle medicine;  and documenting the encounter.    Please refer to Patient Instructions for Blood Glucose  Monitoring and Insulin/Medications Dosing Guide"  in media tab for additional information. Please  also refer to " Patient Self Inventory" in the Media  tab for reviewed elements of pertinent patient history.  Morgan Daniels participated in the discussions, expressed understanding, and voiced agreement with the above plans.  All questions  were answered to her satisfaction. she is encouraged to contact clinic should she have any questions or concerns prior to her return visit.  Follow up plan: - Return in about 3 weeks (around 08/23/2020) for F/U with Meter and Logs Only - no Labs.  Glade Lloyd, MD [facility] [address] [place], [place] [address] Phone: [phone]  Fax: [phone]    08/02/2020, 5:34 PM  This note was partially dictated with voice recognition software. Similar sounding words can be transcribed inadequately or may not  be corrected upon review.

## 2020-08-07 ENCOUNTER — Encounter: Payer: Self-pay | Admitting: Nutrition

## 2020-08-07 ENCOUNTER — Telehealth: Payer: Self-pay | Admitting: Nutrition

## 2020-08-07 NOTE — Telephone Encounter (Signed)
VM left to call back to give update on her progress with blood sugar reading and insulin administration.

## 2020-08-15 ENCOUNTER — Other Ambulatory Visit: Payer: Self-pay | Admitting: "Endocrinology

## 2020-08-15 ENCOUNTER — Ambulatory Visit: Payer: Medicare Other | Admitting: Nurse Practitioner

## 2020-08-15 ENCOUNTER — Other Ambulatory Visit: Payer: Self-pay | Admitting: Nurse Practitioner

## 2020-08-15 ENCOUNTER — Telehealth: Payer: Self-pay

## 2020-08-15 ENCOUNTER — Other Ambulatory Visit: Payer: Self-pay | Admitting: Internal Medicine

## 2020-08-15 DIAGNOSIS — M7540 Impingement syndrome of unspecified shoulder: Secondary | ICD-10-CM

## 2020-08-15 DIAGNOSIS — E1142 Type 2 diabetes mellitus with diabetic polyneuropathy: Secondary | ICD-10-CM

## 2020-08-15 DIAGNOSIS — M064 Inflammatory polyarthropathy: Secondary | ICD-10-CM

## 2020-08-15 NOTE — Telephone Encounter (Signed)
Courtney calling pt to schedule appt for refills

## 2020-08-15 NOTE — Telephone Encounter (Signed)
Lmom for patient to call our office to schedule an appointment. Received a refill request for Tramadol and cannot be refilled without an appointment. Morgan Daniels

## 2020-08-26 ENCOUNTER — Other Ambulatory Visit: Payer: Self-pay | Admitting: "Endocrinology

## 2020-08-27 ENCOUNTER — Telehealth: Payer: Self-pay

## 2020-08-27 NOTE — Telephone Encounter (Signed)
Left vm to screen for 08/28/20 appointment-Toni 

## 2020-08-28 ENCOUNTER — Other Ambulatory Visit: Payer: Self-pay

## 2020-08-28 ENCOUNTER — Encounter: Payer: Self-pay | Admitting: Nutrition

## 2020-08-28 ENCOUNTER — Encounter: Payer: Self-pay | Admitting: Nurse Practitioner

## 2020-08-28 ENCOUNTER — Ambulatory Visit: Payer: Medicare Other | Admitting: Nurse Practitioner

## 2020-08-28 ENCOUNTER — Encounter: Payer: Medicare Other | Attending: Nurse Practitioner | Admitting: Nutrition

## 2020-08-28 ENCOUNTER — Ambulatory Visit (INDEPENDENT_AMBULATORY_CARE_PROVIDER_SITE_OTHER): Payer: Medicare Other | Admitting: Nurse Practitioner

## 2020-08-28 VITALS — Ht 67.0 in | Wt 200.0 lb

## 2020-08-28 VITALS — BP 165/85 | HR 86 | Ht 67.0 in | Wt 201.0 lb

## 2020-08-28 VITALS — BP 140/70 | HR 87 | Temp 97.1°F | Resp 16 | Ht 67.0 in | Wt 195.8 lb

## 2020-08-28 DIAGNOSIS — E669 Obesity, unspecified: Secondary | ICD-10-CM | POA: Diagnosis not present

## 2020-08-28 DIAGNOSIS — Z789 Other specified health status: Secondary | ICD-10-CM

## 2020-08-28 DIAGNOSIS — I10 Essential (primary) hypertension: Secondary | ICD-10-CM | POA: Diagnosis present

## 2020-08-28 DIAGNOSIS — M7540 Impingement syndrome of unspecified shoulder: Secondary | ICD-10-CM | POA: Diagnosis not present

## 2020-08-28 DIAGNOSIS — R300 Dysuria: Secondary | ICD-10-CM

## 2020-08-28 DIAGNOSIS — R197 Diarrhea, unspecified: Secondary | ICD-10-CM | POA: Diagnosis not present

## 2020-08-28 DIAGNOSIS — E781 Pure hyperglyceridemia: Secondary | ICD-10-CM | POA: Diagnosis not present

## 2020-08-28 DIAGNOSIS — E1140 Type 2 diabetes mellitus with diabetic neuropathy, unspecified: Secondary | ICD-10-CM

## 2020-08-28 DIAGNOSIS — E782 Mixed hyperlipidemia: Secondary | ICD-10-CM | POA: Insufficient documentation

## 2020-08-28 DIAGNOSIS — B373 Candidiasis of vulva and vagina: Secondary | ICD-10-CM | POA: Diagnosis not present

## 2020-08-28 DIAGNOSIS — Z794 Long term (current) use of insulin: Secondary | ICD-10-CM | POA: Insufficient documentation

## 2020-08-28 DIAGNOSIS — E1165 Type 2 diabetes mellitus with hyperglycemia: Secondary | ICD-10-CM | POA: Diagnosis not present

## 2020-08-28 DIAGNOSIS — R6889 Other general symptoms and signs: Secondary | ICD-10-CM | POA: Diagnosis not present

## 2020-08-28 DIAGNOSIS — F17200 Nicotine dependence, unspecified, uncomplicated: Secondary | ICD-10-CM | POA: Diagnosis not present

## 2020-08-28 DIAGNOSIS — Z1231 Encounter for screening mammogram for malignant neoplasm of breast: Secondary | ICD-10-CM | POA: Diagnosis not present

## 2020-08-28 DIAGNOSIS — M064 Inflammatory polyarthropathy: Secondary | ICD-10-CM | POA: Diagnosis not present

## 2020-08-28 DIAGNOSIS — B3731 Acute candidiasis of vulva and vagina: Secondary | ICD-10-CM

## 2020-08-28 DIAGNOSIS — R112 Nausea with vomiting, unspecified: Secondary | ICD-10-CM | POA: Diagnosis not present

## 2020-08-28 DIAGNOSIS — F321 Major depressive disorder, single episode, moderate: Secondary | ICD-10-CM | POA: Diagnosis not present

## 2020-08-28 DIAGNOSIS — Z23 Encounter for immunization: Secondary | ICD-10-CM

## 2020-08-28 DIAGNOSIS — E1142 Type 2 diabetes mellitus with diabetic polyneuropathy: Secondary | ICD-10-CM

## 2020-08-28 MED ORDER — TIZANIDINE HCL 4 MG PO TABS: ORAL_TABLET | ORAL | 0 refills | Status: DC

## 2020-08-28 MED ORDER — GABAPENTIN 600 MG PO TABS: ORAL_TABLET | ORAL | 2 refills | Status: DC

## 2020-08-28 MED ORDER — FLUOXETINE HCL 40 MG PO CAPS: 80.0000 mg | ORAL_CAPSULE | Freq: Every day | ORAL | 1 refills | Status: DC

## 2020-08-28 MED ORDER — TETANUS-DIPHTHERIA TOXOIDS TD 5-2 LFU IM INJ: 0.5000 mL | INJECTION | Freq: Once | INTRAMUSCULAR | 0 refills | Status: AC

## 2020-08-28 MED ORDER — ONDANSETRON 8 MG PO TBDP
8.0000 mg | ORAL_TABLET | Freq: Three times a day (TID) | ORAL | 2 refills | Status: DC | PRN
Start: 2020-08-28 — End: 2021-02-01

## 2020-08-28 MED ORDER — TRAMADOL HCL 50 MG PO TABS: 50.0000 mg | ORAL_TABLET | Freq: Three times a day (TID) | ORAL | 0 refills | Status: DC | PRN

## 2020-08-28 MED ORDER — ZOSTER VAC RECOMB ADJUVANTED 50 MCG/0.5ML IM SUSR: 0.5000 mL | Freq: Once | INTRAMUSCULAR | 0 refills | Status: AC

## 2020-08-28 MED ORDER — DIPHENOXYLATE-ATROPINE 2.5-0.025 MG PO TABS: 1.0000 | ORAL_TABLET | Freq: Three times a day (TID) | ORAL | 0 refills | Status: DC | PRN

## 2020-08-28 MED ORDER — GEMFIBROZIL 600 MG PO TABS
600.0000 mg | ORAL_TABLET | Freq: Two times a day (BID) | ORAL | 1 refills | Status: DC
Start: 2020-08-28 — End: 2021-02-01

## 2020-08-28 MED ORDER — FLUCONAZOLE 150 MG PO TABS: ORAL_TABLET | ORAL | 0 refills | Status: DC

## 2020-08-28 MED ORDER — PREVNAR 20 0.5 ML IM SUSY: 0.5000 mL | PREFILLED_SYRINGE | INTRAMUSCULAR | 0 refills | Status: AC

## 2020-08-28 MED ORDER — METOPROLOL SUCCINATE ER 50 MG PO TB24: 50.0000 mg | ORAL_TABLET | Freq: Every day | ORAL | 5 refills | Status: DC

## 2020-08-28 MED ORDER — HYDROCHLOROTHIAZIDE 25 MG PO TABS: ORAL_TABLET | ORAL | 1 refills | Status: DC

## 2020-08-28 NOTE — Progress Notes (Signed)
[facility] [place], [place] [address]  Internal MEDICINE  Office Visit Note  Patient Name: Morgan Daniels  [id number]  [id number]  Date of Service: 08/28/2020  Chief Complaint  Patient presents with   Follow-up    Refill request    Depression   Diabetes    Sees endo   Gastroesophageal Reflux   Hypertension   Anxiety   Quality Metric Gaps    Pneumovax, tdap, shingrix, pneumovax    HPI Teona presents for a follow up visit for medication refills. She has a history of depression, diabetes, GERD, hypertension, anxiety, stroke, arthritis, fibromyalgia, and diverticulosis. Her surgical history is significant for cholecystectomy, hysterectomy, hernia repair and spinal surgery. She sees endocrinology for diabetes management. She is due for pneumonia and shingles vaccine as well as tetanus booster. She is working on diet monidifications to help control her diabetes and to lose weight.  She has been working on smoking cessation and is down to 10 cigarettes per day. Her CPE is due in November 2022.     Current Medication: Outpatient Encounter Medications as of 08/28/2020  Medication Sig Note   Cholecalciferol (VITAMIN D-3) 5000 UNITS TABS Take 5,000 Units by mouth daily.     Continuous Blood Gluc Receiver (FREESTYLE LIBRE 2 READER) DEVI As directed    Continuous Blood Gluc Sensor (FREESTYLE LIBRE 2 SENSOR) MISC Use to test BG qid. Dx E11.65    insulin aspart (NOVOLOG FLEXPEN) 100 UNIT/ML FlexPen Inject 20-26 Units into the skin 3 (three) times daily before meals.    insulin degludec (TRESIBA FLEXTOUCH) 200 UNIT/ML FlexTouch Pen Inject 80 Units into the skin at bedtime.    Insulin Pen Needle (ADVOCATE INSULIN PEN NEEDLES) 33G X 4 MM MISC To use with insulin dosing TID prior to meals and QHS for basal insulin.    omeprazole (PRILOSEC) 20 MG capsule Take 20 mg by mouth daily as needed.    ONETOUCH DELICA LANCETS FINE MISC CHECK BLOOD SUGAR THREE TIMES A DAY 11/09/2014:  Received from: External Pharmacy   ONETOUCH VERIO test strip CHECK BLOOD SUGAR THREE TIMES A DAY 11/09/2014: Received from: External Pharmacy   Probiotic Product (PROBIOTIC PO) Take 1 tablet by mouth daily in the afternoon.    Tdap (BOOSTRIX) 5-2.5-18.5 LF-MCG/0.5 injection Inject 0.5 mLs into the muscle once.    [EXPIRED] tetanus & diphtheria toxoids, adult, (TENIVAC) 5-2 LFU injection Inject 0.5 mLs into the muscle once for 1 dose.    [DISCONTINUED] diphenoxylate-atropine (LOMOTIL) 2.5-0.025 MG tablet Take 1 tablet by mouth 3 (three) times daily as needed for diarrhea or loose stools.    [DISCONTINUED] fluconazole (DIFLUCAN) 150 MG tablet Take 1 tab po once daily    [DISCONTINUED] FLUoxetine (PROZAC) 40 MG capsule TAKE 2 CAPSULES BY MOUTH DAILY    [DISCONTINUED] gabapentin (NEURONTIN) 600 MG tablet TAKE 1 TABLET BY MOUTH THREE TIMES A DAY.    [DISCONTINUED] gemfibrozil (LOPID) 600 MG tablet Take 1 tablet (600 mg total) by mouth 2 (two) times daily before a meal.    [DISCONTINUED] hydrochlorothiazide (HYDRODIURIL) 25 MG tablet TAKE 1 TABLET BY MOUTH DAILY FOR BLOOD PRESSURE/EDEMA    [DISCONTINUED] metoprolol succinate (TOPROL-XL) 50 MG 24 hr tablet Take 1 tablet (50 mg total) by mouth daily. Take with or immediately following a meal.    [DISCONTINUED] ondansetron (ZOFRAN ODT) 8 MG disintegrating tablet Take 1 tablet (8 mg total) by mouth every 8 (eight) hours as needed for nausea.    [DISCONTINUED] pneumococcal 20-Val Conj Vacc (  PREVNAR 20) 0.5 ML SUSY Inject 0.5 mLs into the muscle tomorrow at 10 am.    [DISCONTINUED] tiZANidine (ZANAFLEX) 4 MG tablet TAKE 1 TABLET(S) BY MOUTH TWICE A DAY AS NEEDED FOR MUSCLE PAIN AND SPASMS    [DISCONTINUED] traMADol (ULTRAM) 50 MG tablet Take 1 tablet (50 mg total) by mouth 3 (three) times daily as needed for moderate pain or severe pain.    [DISCONTINUED] Zoster Vaccine Adjuvanted [facility]) injection Inject 0.5 mLs into the muscle once.     diphenoxylate-atropine (LOMOTIL) 2.5-0.025 MG tablet Take 1 tablet by mouth 3 (three) times daily as needed for diarrhea or loose stools.    fluconazole (DIFLUCAN) 150 MG tablet Take 1 tab po once daily    FLUoxetine (PROZAC) 40 MG capsule Take 2 capsules (80 mg total) by mouth daily.    gabapentin (NEURONTIN) 600 MG tablet TAKE 1 TABLET BY MOUTH THREE TIMES A DAY.    gemfibrozil (LOPID) 600 MG tablet Take 1 tablet (600 mg total) by mouth 2 (two) times daily before a meal.    hydrochlorothiazide (HYDRODIURIL) 25 MG tablet TAKE 1 TABLET BY MOUTH DAILY FOR BLOOD PRESSURE/EDEMA    metoprolol succinate (TOPROL-XL) 50 MG 24 hr tablet Take 1 tablet (50 mg total) by mouth daily. Take with or immediately following a meal.    ondansetron (ZOFRAN ODT) 8 MG disintegrating tablet Take 1 tablet (8 mg total) by mouth every 8 (eight) hours as needed for nausea.    [EXPIRED] pneumococcal 20-Val Conj Vacc (PREVNAR 20) 0.5 ML SUSY Inject 0.5 mLs into the muscle tomorrow at 10 am for 1 dose.    tiZANidine (ZANAFLEX) 4 MG tablet TAKE 1 TABLET(S) BY MOUTH TWICE A DAY AS NEEDED FOR MUSCLE PAIN AND SPASMS    traMADol (ULTRAM) 50 MG tablet Take 1 tablet (50 mg total) by mouth 3 (three) times daily as needed for moderate pain or severe pain.    [EXPIRED] Zoster Vaccine Adjuvanted [facility]) injection Inject 0.5 mLs into the muscle once for 1 dose.    [DISCONTINUED] nitrofurantoin, macrocrystal-monohydrate, (MACROBID) 100 MG capsule Take 1 capsule (100 mg total) by mouth 2 (two) times daily. (Patient not taking: Reported on 08/28/2020)    No facility-administered encounter medications on file as of 08/28/2020.    Surgical History: Past Surgical History:  Procedure Laterality Date   ABDOMINAL HYSTERECTOMY  1990   BACK SURGERY  2000, 2004   CAROTID ENDARTERECTOMY Left 05/06/12   CARPAL TUNNEL RELEASE Left 07/18/2015   Procedure: CARPAL TUNNEL RELEASE;  Surgeon: Earnestine Leys, MD;  Location: [facility] ORS;  Service: Orthopedics;   Laterality: Left;   CEREBRAL ANGIOGRAM Bilateral 05/03/2012   Procedure: CEREBRAL ANGIOGRAM;  Surgeon: Angelia Mould, MD;  Location: [facility] CATH LAB;  Service: Cardiovascular;  Laterality: Bilateral;   CHOLECYSTECTOMY  2001   COLONOSCOPY WITH PROPOFOL N/A 11/19/2018   Procedure: COLONOSCOPY WITH PROPOFOL;  Surgeon: Lucilla Lame, MD;  Location: [place];  Service: Endoscopy;  Laterality: N/A;  Diabetic - insulin   ENDARTERECTOMY Left 05/06/2012   Procedure: ENDARTERECTOMY CAROTID;  Surgeon: Angelia Mould, MD;  Location: [place];  Service: Vascular;  Laterality: Left;   ENDARTERECTOMY Right 08/09/2013   Procedure: ENDARTERECTOMY CAROTID-RIGHT;  Surgeon: Angelia Mould, MD;  Location: [place];  Service: Vascular;  Laterality: Right;   HERNIA REPAIR     PATCH ANGIOPLASTY Left 05/06/2012   Procedure: WITH DACRON PATCH ANGIOPLASTY ;  Surgeon: Angelia Mould, MD;  Location: [place];  Service: Vascular;  Laterality: Left;  POLYPECTOMY  11/19/2018   Procedure: POLYPECTOMY;  Surgeon: Lucilla Lame, MD;  Location: [place];  Service: Endoscopy;;   SPINE SURGERY  2004   TONSILLECTOMY     TUBAL LIGATION      Medical History: Past Medical History:  Diagnosis Date   Anxiety    Arthritis    Carotid artery occlusion    Chronic kidney disease May 2017   UTI   Depression    Diabetes ([place])    Diverticulosis    Fatty liver    Fibromyalgia    GERD (gastroesophageal reflux disease)    H/O hiatal hernia    Hypertension    IBS (irritable bowel syndrome)    Peptic ulcer    Plantar fasciitis    Stroke [facility]) Dec. 14,2013   Right side-ministroke    Family History: Family History  Problem Relation Age of Onset   Hypertension Mother    Hyperlipidemia Mother    Deep vein thrombosis Mother    Cancer Father    Alcohol abuse Father    Heart failure Father    Hypertension Maternal Grandmother    Deep vein thrombosis Sister    Alcohol abuse Sister    Alcohol  abuse Sister     Social History   Socioeconomic History   Marital status: Married    Spouse name: Not on file   Number of children: Not on file   Years of education: Not on file   Highest education level: Not on file  Occupational History   Not on file  Tobacco Use   Smoking status: Every Day    Packs/day: 1.00    Years: 40.00    Pack years: 40.00    Types: Cigarettes    Start date: 11/09/1970   Smokeless tobacco: Never   Tobacco comments:       Vaping Use   Vaping Use: Former  Substance and Sexual Activity   Alcohol use: Yes    Alcohol/week: 0.0 standard drinks    Comment: rarely   Drug use: No   Sexual activity: Not Currently    Partners: Male  Other Topics Concern   Not on file  Social History Narrative   Not on file   Social Determinants of Health   Financial Resource Strain: Not on file  Food Insecurity: Not on file  Transportation Needs: Not on file  Physical Activity: Not on file  Stress: Not on file  Social Connections: Not on file  Intimate Partner Violence: Not on file      Review of Systems  Constitutional:  Negative for chills, fatigue and unexpected weight change.  HENT:  Negative for congestion, rhinorrhea, sneezing and sore throat.   Eyes:  Negative for redness.  Respiratory:  Negative for cough, chest tightness and shortness of breath.   Cardiovascular:  Negative for chest pain and palpitations.  Gastrointestinal:  Negative for abdominal pain, constipation, diarrhea, nausea and vomiting.  Genitourinary:  Negative for dysuria and frequency.  Musculoskeletal:  Negative for arthralgias, back pain, joint swelling and neck pain.  Skin:  Negative for rash.  Neurological: Negative.  Negative for tremors and numbness.  Hematological:  Negative for adenopathy. Does not bruise/bleed easily.  Psychiatric/Behavioral:  Negative for behavioral problems (Depression), sleep disturbance and suicidal ideas. The patient is not nervous/anxious.    Vital  Signs: BP 140/70   Pulse 87   Temp (!) 97.1 F (36.2 C)   Resp 16   Ht 5\' 7"  (1.702 m)   Wt 195 lb  12.8 oz (88.8 kg)   SpO2 97%   BMI 30.67 kg/m    Physical Exam Vitals reviewed.  Constitutional:      General: She is not in acute distress.    Appearance: Normal appearance. She is well-developed. She is obese. She is not ill-appearing or diaphoretic.  HENT:     Head: Normocephalic and atraumatic.  Neck:     Thyroid: No thyromegaly.     Vascular: No JVD.     Trachea: No tracheal deviation.  Cardiovascular:     Rate and Rhythm: Normal rate and regular rhythm.     Pulses: Normal pulses.     Heart sounds: Normal heart sounds. No murmur heard.   No friction rub. No gallop.  Pulmonary:     Effort: Pulmonary effort is normal. No respiratory distress.     Breath sounds: Normal breath sounds. No wheezing or rales.  Chest:     Chest wall: No tenderness.  Skin:    General: Skin is warm and dry.     Capillary Refill: Capillary refill takes less than 2 seconds.  Neurological:     Mental Status: She is alert and oriented to person, place, and time.  Psychiatric:        Mood and Affect: Mood normal.        Behavior: Behavior normal.    Assessment/Plan: 1. Essential hypertension Stable with current medication, refills ordered.  - hydrochlorothiazide (HYDRODIURIL) 25 MG tablet; TAKE 1 TABLET BY MOUTH DAILY FOR BLOOD PRESSURE/EDEMA  Dispense: 90 tablet; Refill: 1 - metoprolol succinate (TOPROL-XL) 50 MG 24 hr tablet; Take 1 tablet (50 mg total) by mouth daily. Take with or immediately following a meal.  Dispense: 30 tablet; Refill: 5  2. Type 2 diabetes mellitus with diabetic neuropathy, with long-term current use of insulin (HCC) Takes gabapentin for neuropathy, refill ordered.  - gabapentin (NEURONTIN) 600 MG tablet; TAKE 1 TABLET BY MOUTH THREE TIMES A DAY.  Dispense: 270 tablet; Refill: 2  3. Impingement syndrome of shoulder region, unspecified laterality Refill ordered.  -  tiZANidine (ZANAFLEX) 4 MG tablet; TAKE 1 TABLET(S) BY MOUTH TWICE A DAY AS NEEDED FOR MUSCLE PAIN AND SPASMS  Dispense: 60 tablet; Refill: 0  4. Diarrhea, unspecified type Takes lomotil as needed, refill ordered.  - diphenoxylate-atropine (LOMOTIL) 2.5-0.025 MG tablet; Take 1 tablet by mouth 3 (three) times daily as needed for diarrhea or loose stools.  Dispense: 30 tablet; Refill: 0  5. Inflammatory polyarthritis ([place]) Chronic problem, stable with current medication, refill ordered.  - traMADol (ULTRAM) 50 MG tablet; Take 1 tablet (50 mg total) by mouth 3 (three) times daily as needed for moderate pain or severe pain.  Dispense: 90 tablet; Refill: 0  6. Hypertriglyceridemia Refill ordered - gemfibrozil (LOPID) 600 MG tablet; Take 1 tablet (600 mg total) by mouth 2 (two) times daily before a meal.  Dispense: 180 tablet; Refill: 1  7. Non-intractable vomiting with nausea, unspecified vomiting type Takes zofran as needed, refill ordered.  - ondansetron (ZOFRAN ODT) 8 MG disintegrating tablet; Take 1 tablet (8 mg total) by mouth every 8 (eight) hours as needed for nausea.  Dispense: 30 tablet; Refill: 2  8. Depression, major, single episode, moderate ([place]) Stable with current medication, refill ordered.  - FLUoxetine (PROZAC) 40 MG capsule; Take 2 capsules (80 mg total) by mouth daily.  Dispense: 180 capsule; Refill: 1  9. Encounter for screening mammogram for malignant neoplasm of breast Requesting routine breast cancer screening via mammogram, order sent.  -  MM DIGITAL SCREENING BILATERAL; Future  10. Encounter for vaccination Multiple vaccinations due, orderes sent to pharmacy, patient in agreement with plan - Tdap ([place]) 5-2.5-18.5 LF-MCG/0.5 injection; Inject 0.5 mLs into the muscle once. - pneumococcal 20-Val Conj Vacc (PREVNAR 20) 0.5 ML SUSY; Inject 0.5 mLs into the muscle tomorrow at 10 am for 1 dose.  Dispense: 0.5 mL; Refill: 0 - Zoster Vaccine Adjuvanted [facility])  injection; Inject 0.5 mLs into the muscle once for 1 dose.  Dispense: 0.5 mL; Refill: 0 - tetanus & diphtheria toxoids, adult, (TENIVAC) 5-2 LFU injection; Inject 0.5 mLs into the muscle once for 1 dose.  Dispense: 0.5 mL; Refill: 0  11. Vaginal yeast infection Fluconazole ordered to have on hand in case of a yeast infection.  - fluconazole (DIFLUCAN) 150 MG tablet; Take 1 tab po once daily  Dispense: 3 tablet; Refill: 0   General Counseling: earnstine meinders understanding of the findings of todays visit and agrees with plan of treatment. I have discussed any further diagnostic evaluation that may be needed or ordered today. We also reviewed her medications today. she has been encouraged to call the office with any questions or concerns that should arise related to todays visit.    Orders Placed This Encounter  Procedures   MM DIGITAL SCREENING BILATERAL    Meds ordered this encounter  Medications   pneumococcal 20-Val Conj Vacc (PREVNAR 20) 0.5 ML SUSY    Sig: Inject 0.5 mLs into the muscle tomorrow at 10 am for 1 dose.    Dispense:  0.5 mL    Refill:  0   Zoster Vaccine Adjuvanted [facility]) injection    Sig: Inject 0.5 mLs into the muscle once for 1 dose.    Dispense:  0.5 mL    Refill:  0   tetanus & diphtheria toxoids, adult, (TENIVAC) 5-2 LFU injection    Sig: Inject 0.5 mLs into the muscle once for 1 dose.    Dispense:  0.5 mL    Refill:  0   hydrochlorothiazide (HYDRODIURIL) 25 MG tablet    Sig: TAKE 1 TABLET BY MOUTH DAILY FOR BLOOD PRESSURE/EDEMA    Dispense:  90 tablet    Refill:  1   gabapentin (NEURONTIN) 600 MG tablet    Sig: TAKE 1 TABLET BY MOUTH THREE TIMES A DAY.    Dispense:  270 tablet    Refill:  2    Dx neuropathy   tiZANidine (ZANAFLEX) 4 MG tablet    Sig: TAKE 1 TABLET(S) BY MOUTH TWICE A DAY AS NEEDED FOR MUSCLE PAIN AND SPASMS    Dispense:  60 tablet    Refill:  0   diphenoxylate-atropine (LOMOTIL) 2.5-0.025 MG tablet    Sig: Take 1 tablet by  mouth 3 (three) times daily as needed for diarrhea or loose stools.    Dispense:  30 tablet    Refill:  0   traMADol (ULTRAM) 50 MG tablet    Sig: Take 1 tablet (50 mg total) by mouth 3 (three) times daily as needed for moderate pain or severe pain.    Dispense:  90 tablet    Refill:  0    Confirmed with patient no allergy to tramadol   metoprolol succinate (TOPROL-XL) 50 MG 24 hr tablet    Sig: Take 1 tablet (50 mg total) by mouth daily. Take with or immediately following a meal.    Dispense:  30 tablet    Refill:  5    D/c bisoprolol due to  back order   gemfibrozil (LOPID) 600 MG tablet    Sig: Take 1 tablet (600 mg total) by mouth 2 (two) times daily before a meal.    Dispense:  180 tablet    Refill:  1   ondansetron (ZOFRAN ODT) 8 MG disintegrating tablet    Sig: Take 1 tablet (8 mg total) by mouth every 8 (eight) hours as needed for nausea.    Dispense:  30 tablet    Refill:  2   FLUoxetine (PROZAC) 40 MG capsule    Sig: Take 2 capsules (80 mg total) by mouth daily.    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   Total time spent:30 Minutes Time spent includes review of chart, medications, test results, and follow up plan with the patient.   [place] Controlled Substance Database was reviewed by me.  This patient was seen by Jonetta Osgood, FNP-C in collaboration with Dr. Clayborn Bigness as a part of collaborative care agreement.   Bynum Mccullars R. Valetta Fuller, MSN, FNP-C Internal medicine

## 2020-08-28 NOTE — Patient Instructions (Addendum)
Goals  Take insulins as prescribed Increase lower carb vegetables Drink a gallon of water per day Get BS less than 180 in am and less than 200 before bed.

## 2020-08-28 NOTE — Patient Instructions (Signed)

## 2020-08-28 NOTE — Progress Notes (Signed)
08/28/2020, 4:14 PM   Endocrinology follow-up note  Subjective:    Patient ID: Morgan Daniels, female    DOB: 1960/09/15.  Morgan Daniels is being seen in follow-up after she was seen in consultation for management of currently uncontrolled symptomatic diabetes requested by  Lavera Guise, MD.   Past Medical History:  Diagnosis Date   Anxiety    Arthritis    Carotid artery occlusion    Chronic kidney disease May 2017   UTI   Depression    Diabetes ([place])    Diverticulosis    Fatty liver    Fibromyalgia    GERD (gastroesophageal reflux disease)    H/O hiatal hernia    Hypertension    IBS (irritable bowel syndrome)    Peptic ulcer    Plantar fasciitis    Stroke [facility]) Dec. 14,2013   Right side-ministroke    Past Surgical History:  Procedure Laterality Date   [place]  2000, 2004   CAROTID ENDARTERECTOMY Left 05/06/12   CARPAL TUNNEL RELEASE Left 07/18/2015   Procedure: CARPAL TUNNEL RELEASE;  Surgeon: Earnestine Leys, MD;  Location: [facility] ORS;  Service: Orthopedics;  Laterality: Left;   CEREBRAL ANGIOGRAM Bilateral 05/03/2012   Procedure: CEREBRAL ANGIOGRAM;  Surgeon: Angelia Mould, MD;  Location: [facility] CATH LAB;  Service: Cardiovascular;  Laterality: Bilateral;   CHOLECYSTECTOMY  2001   COLONOSCOPY WITH PROPOFOL N/A 11/19/2018   Procedure: COLONOSCOPY WITH PROPOFOL;  Surgeon: Lucilla Lame, MD;  Location: [place];  Service: Endoscopy;  Laterality: N/A;  Diabetic - insulin   ENDARTERECTOMY Left 05/06/2012   Procedure: ENDARTERECTOMY CAROTID;  Surgeon: Angelia Mould, MD;  Location: [place];  Service: Vascular;  Laterality: Left;   ENDARTERECTOMY Right 08/09/2013   Procedure: ENDARTERECTOMY CAROTID-RIGHT;  Surgeon: Angelia Mould, MD;  Location: [place];  Service: Vascular;  Laterality: Right;   HERNIA REPAIR     PATCH ANGIOPLASTY  Left 05/06/2012   Procedure: WITH DACRON PATCH ANGIOPLASTY ;  Surgeon: Angelia Mould, MD;  Location: [place];  Service: Vascular;  Laterality: Left;   POLYPECTOMY  11/19/2018   Procedure: POLYPECTOMY;  Surgeon: Lucilla Lame, MD;  Location: [place];  Service: Endoscopy;;   SPINE SURGERY  2004   TONSILLECTOMY     TUBAL LIGATION      Social History   Socioeconomic History   Marital status: Married    Spouse name: Not on file   Number of children: Not on file   Years of education: Not on file   Highest education level: Not on file  Occupational History   Not on file  Tobacco Use   Smoking status: Every Day    Packs/day: 1.00    Years: 40.00    Pack years: 40.00    Types: Cigarettes    Start date: 11/09/1970   Smokeless tobacco: Never   Tobacco comments:       Vaping Use   Vaping Use: Former  Substance and Sexual Activity   Alcohol use: Yes    Alcohol/week: 0.0 standard drinks  Comment: rarely   Drug use: No   Sexual activity: Not Currently    Partners: Male  Other Topics Concern   Not on file  Social History Narrative   Not on file   Social Determinants of Health   Financial Resource Strain: Not on file  Food Insecurity: Not on file  Transportation Needs: Not on file  Physical Activity: Not on file  Stress: Not on file  Social Connections: Not on file    Family History  Problem Relation Age of Onset   Hypertension Mother    Hyperlipidemia Mother    Deep vein thrombosis Mother    Cancer Father    Alcohol abuse Father    Heart failure Father    Hypertension Maternal Grandmother    Deep vein thrombosis Sister    Alcohol abuse Sister    Alcohol abuse Sister     Outpatient Encounter Medications as of 08/28/2020  Medication Sig   Cholecalciferol (VITAMIN D-3) 5000 UNITS TABS Take 5,000 Units by mouth daily.    Continuous Blood Gluc Receiver (FREESTYLE LIBRE 2 READER) DEVI As directed   Continuous Blood Gluc Sensor (FREESTYLE LIBRE 2 SENSOR)  MISC Use to test BG qid. Dx E11.65   diphenoxylate-atropine (LOMOTIL) 2.5-0.025 MG tablet Take 1 tablet by mouth 3 (three) times daily as needed for diarrhea or loose stools.   fluconazole (DIFLUCAN) 150 MG tablet Take 1 tab po once daily   FLUoxetine (PROZAC) 40 MG capsule Take 2 capsules (80 mg total) by mouth daily.   gabapentin (NEURONTIN) 600 MG tablet TAKE 1 TABLET BY MOUTH THREE TIMES A DAY.   gemfibrozil (LOPID) 600 MG tablet Take 1 tablet (600 mg total) by mouth 2 (two) times daily before a meal.   hydrochlorothiazide (HYDRODIURIL) 25 MG tablet TAKE 1 TABLET BY MOUTH DAILY FOR BLOOD PRESSURE/EDEMA   insulin aspart (NOVOLOG FLEXPEN) 100 UNIT/ML FlexPen Inject 20-26 Units into the skin 3 (three) times daily before meals.   insulin degludec (TRESIBA FLEXTOUCH) 200 UNIT/ML FlexTouch Pen Inject 80 Units into the skin at bedtime.   Insulin Pen Needle (ADVOCATE INSULIN PEN NEEDLES) 33G X 4 MM MISC To use with insulin dosing TID prior to meals and QHS for basal insulin.   metoprolol succinate (TOPROL-XL) 50 MG 24 hr tablet Take 1 tablet (50 mg total) by mouth daily. Take with or immediately following a meal.   omeprazole (PRILOSEC) 20 MG capsule Take 20 mg by mouth daily as needed.   ondansetron (ZOFRAN ODT) 8 MG disintegrating tablet Take 1 tablet (8 mg total) by mouth every 8 (eight) hours as needed for nausea.   ONETOUCH DELICA LANCETS FINE MISC CHECK BLOOD SUGAR THREE TIMES A DAY   ONETOUCH VERIO test strip CHECK BLOOD SUGAR THREE TIMES A DAY   pneumococcal 20-Val Conj Vacc (PREVNAR 20) 0.5 ML SUSY Inject 0.5 mLs into the muscle tomorrow at 10 am for 1 dose.   Probiotic Product (PROBIOTIC PO) Take 1 tablet by mouth daily in the afternoon.   Tdap (BOOSTRIX) 5-2.5-18.5 LF-MCG/0.5 injection Inject 0.5 mLs into the muscle once.   tetanus & diphtheria toxoids, adult, (TENIVAC) 5-2 LFU injection Inject 0.5 mLs into the muscle once for 1 dose.   tiZANidine (ZANAFLEX) 4 MG tablet TAKE 1 TABLET(S) BY  MOUTH TWICE A DAY AS NEEDED FOR MUSCLE PAIN AND SPASMS   traMADol (ULTRAM) 50 MG tablet Take 1 tablet (50 mg total) by mouth 3 (three) times daily as needed for moderate pain or severe pain.  Zoster Vaccine Adjuvanted [facility]) injection Inject 0.5 mLs into the muscle once for 1 dose.   [DISCONTINUED] diphenoxylate-atropine (LOMOTIL) 2.5-0.025 MG tablet Take 1 tablet by mouth 3 (three) times daily as needed for diarrhea or loose stools.   [DISCONTINUED] fluconazole (DIFLUCAN) 150 MG tablet Take 1 tab po once daily   [DISCONTINUED] FLUoxetine (PROZAC) 40 MG capsule TAKE 2 CAPSULES BY MOUTH DAILY   [DISCONTINUED] gabapentin (NEURONTIN) 600 MG tablet TAKE 1 TABLET BY MOUTH THREE TIMES A DAY.   [DISCONTINUED] gemfibrozil (LOPID) 600 MG tablet Take 1 tablet (600 mg total) by mouth 2 (two) times daily before a meal.   [DISCONTINUED] hydrochlorothiazide (HYDRODIURIL) 25 MG tablet TAKE 1 TABLET BY MOUTH DAILY FOR BLOOD PRESSURE/EDEMA   [DISCONTINUED] metoprolol succinate (TOPROL-XL) 50 MG 24 hr tablet Take 1 tablet (50 mg total) by mouth daily. Take with or immediately following a meal.   [DISCONTINUED] nitrofurantoin, macrocrystal-monohydrate, (MACROBID) 100 MG capsule Take 1 capsule (100 mg total) by mouth 2 (two) times daily. (Patient not taking: Reported on 08/28/2020)   [DISCONTINUED] ondansetron (ZOFRAN ODT) 8 MG disintegrating tablet Take 1 tablet (8 mg total) by mouth every 8 (eight) hours as needed for nausea.   [DISCONTINUED] tiZANidine (ZANAFLEX) 4 MG tablet TAKE 1 TABLET(S) BY MOUTH TWICE A DAY AS NEEDED FOR MUSCLE PAIN AND SPASMS   [DISCONTINUED] traMADol (ULTRAM) 50 MG tablet Take 1 tablet (50 mg total) by mouth 3 (three) times daily as needed for moderate pain or severe pain.   No facility-administered encounter medications on file as of 08/28/2020.    ALLERGIES: Allergies  Allergen Reactions   Simvastatin Diarrhea and Nausea And Vomiting   Morphine And Related Itching   Milk-Related  Compounds Diarrhea    bloating   Latex Rash    Rash when she wears gloves (Negative by test - per pt)    VACCINATION STATUS: Immunization History  Administered Date(s) Administered   Influenza Inj Mdck Quad Pf 12/30/2017, 12/20/2018, 12/23/2019   Influenza,inj,Quad PF,6+ Mos 12/30/2017   PFIZER(Purple Top)SARS-COV-2 Vaccination 05/04/2019, 05/25/2019, 02/17/2020   Td 04/18/2006    Diabetes She presents for her follow-up diabetic visit. She has type 2 diabetes mellitus. Her disease course has been worsening. There are no hypoglycemic associated symptoms. Pertinent negatives for hypoglycemia include no confusion, headaches, pallor or seizures. Associated symptoms include fatigue, polydipsia and polyuria. Pertinent negatives for diabetes include no chest pain and no polyphagia. There are no hypoglycemic complications. Symptoms are worsening. Diabetic complications include a CVA, peripheral neuropathy and retinopathy. Risk factors for coronary artery disease include dyslipidemia, diabetes mellitus, family history, tobacco exposure, sedentary lifestyle, post-menopausal and hypertension. Current diabetic treatment includes insulin injections (She is currently on Tresiba 80 units nightly, NovoLog 1-2 times a day, maximum 42 units daily.). She is compliant with treatment most of the time. Her weight is fluctuating minimally. She is following a generally unhealthy diet. When asked about meal planning, she reported none. She has had a previous visit with a dietitian. She rarely participates in exercise. Her home blood glucose trend is fluctuating minimally. Her breakfast blood glucose range is generally >200 mg/dl. Her lunch blood glucose range is generally >200 mg/dl. Her dinner blood glucose range is generally >200 mg/dl. Her bedtime blood glucose range is generally >200 mg/dl. Her overall blood glucose range is >200 mg/dl. (She presents today with her CGM, no logs, showing persistently high glycemic profile  overall.  She was not due for another A1c today.  Analysis of her CGM shows TIR 2%, TAR  98% (68% being very high range), TBR 0%.  She reports she is still struggling with sticking to a meal routine and often drinks sugary beverages including water additives.  She denies any hypoglycemia.) An ACE inhibitor/angiotensin II receptor blocker is being taken. Eye exam is current.  Hyperlipidemia This is a chronic problem. The problem is uncontrolled. Recent lipid tests were reviewed and are high. Exacerbating diseases include diabetes. Factors aggravating her hyperlipidemia include fatty foods, thiazides and beta blockers. Pertinent negatives include no chest pain, myalgias or shortness of breath. Current antihyperlipidemic treatment includes fibric acid derivatives (does not tolerate statins). The current treatment provides mild improvement of lipids. Compliance problems include adherence to diet and adherence to exercise.  Risk factors for coronary artery disease include diabetes mellitus, dyslipidemia, hypertension, a sedentary lifestyle and post-menopausal.  Hypertension This is a chronic problem. The current episode started more than 1 year ago. The problem has been waxing and waning since onset. The problem is uncontrolled. Pertinent negatives include no chest pain, headaches, palpitations or shortness of breath. There are no associated agents to hypertension. Risk factors for coronary artery disease include dyslipidemia, diabetes mellitus, obesity, sedentary lifestyle and smoking/tobacco exposure. Past treatments include diuretics and beta blockers. The current treatment provides mild improvement. Compliance problems include diet and exercise.  Hypertensive end-organ damage includes CVA and retinopathy.   Review of systems  Constitutional: + Minimally fluctuating body weight,  current Body mass index is 31.48 kg/m. , + fatigue, no subjective hyperthermia, no subjective hypothermia Eyes: no blurry vision,  no xerophthalmia ENT: no sore throat, no nodules palpated in throat, no dysphagia/odynophagia, no hoarseness Cardiovascular: no chest pain, no shortness of breath, no palpitations, no leg swelling Respiratory: no cough, no shortness of breath Gastrointestinal: no nausea/vomiting/diarrhea Genitourinary: + polyuria Musculoskeletal: no muscle/joint aches Skin: no rashes, no hyperemia Neurological: no tremors, no numbness, no tingling, no dizziness Psychiatric: no depression, no anxiety   Objective:    BP (!) 165/85   Pulse 86   Ht 5\' 7"  (1.702 m)   Wt 201 lb (91.2 kg)   BMI 31.48 kg/m   Wt Readings from Last 3 Encounters:  08/28/20 200 lb (90.7 kg)  08/28/20 201 lb (91.2 kg)  08/28/20 195 lb 12.8 oz (88.8 kg)    BP Readings from Last 3 Encounters:  08/28/20 (!) 165/85  08/28/20 140/70  08/02/20 (!) 159/83    Physical Exam- Limited  Constitutional:  Body mass index is 31.48 kg/m. , not in acute distress, normal state of mind Eyes:  EOMI, no exophthalmos Neck: Supple Cardiovascular: RRR, no murmurs, rubs, or gallops, no edema Respiratory: Adequate breathing efforts, no crackles, rales, rhonchi, or wheezing Musculoskeletal: no gross deformities, strength intact in all four extremities, no gross restriction of joint movements Skin:  no rashes, no hyperemia Neurological: no tremor with outstretched hands   POCT ABI Results 08/28/20   Right ABI:  PAD      Left ABI:  PAD  Right leg systolic / diastolic: PAD mmHg Left leg systolic / diastolic: PAD mmHg  Arm systolic / diastolic: 355/97 mmHG  Detailed report will be scanned into patient chart.    CMP ( most recent) CMP     Component Value Date/Time   NA 133 (L) 05/14/2020 1025   NA 138 07/30/2013 0436   K 4.1 05/14/2020 1025   K 4.0 07/30/2013 0436   CL 97 (L) 05/14/2020 1025   CL 103 07/30/2013 0436   CO2 26 05/14/2020 1025   CO2 26  07/30/2013 0436   GLUCOSE 281 (H) 05/14/2020 1025   GLUCOSE 109 (H)  07/30/2013 0436   BUN 15 05/14/2020 1025   BUN 15 07/30/2013 0436   CREATININE 0.60 05/14/2020 1025   CREATININE 0.67 07/30/2013 0436   CALCIUM 9.5 05/14/2020 1025   CALCIUM 8.8 07/30/2013 0436   PROT 7.9 05/14/2020 1025   PROT 7.8 07/29/2013 0650   ALBUMIN 4.0 05/14/2020 1025   ALBUMIN 3.5 07/29/2013 0650   AST 22 05/14/2020 1025   AST 25 07/29/2013 0650   ALT 20 05/14/2020 1025   ALT 38 07/29/2013 0650   ALKPHOS 66 05/14/2020 1025   ALKPHOS 83 07/29/2013 0650   BILITOT 0.8 05/14/2020 1025   BILITOT 0.2 07/29/2013 0650   GFRNONAA >60 05/14/2020 1025   GFRNONAA >60 07/30/2013 0436   GFRAA >60 02/03/2019 1126   GFRAA >60 07/30/2013 0436     Diabetic Labs (most recent): Lab Results  Component Value Date   HGBA1C 10.9 (A) 08/02/2020   HGBA1C 10.6 (A) 05/04/2020   HGBA1C 12.5 (A) 12/23/2019     Lipid Panel ( most recent) Lipid Panel     Component Value Date/Time   CHOL 360 (H) 05/14/2020 1025   CHOL 211 (H) 07/30/2013 0436   TRIG 757 (H) 05/14/2020 1025   TRIG 607 (H) 07/30/2013 0436   HDL 36 (L) 05/14/2020 1025   HDL 24 (L) 07/30/2013 0436   CHOLHDL 10.0 05/14/2020 1025   VLDL UNABLE TO CALCULATE IF TRIGLYCERIDE OVER 400 mg/dL 05/14/2020 1025   VLDL SEE COMMENT 07/30/2013 0436   LDLCALC UNABLE TO CALCULATE IF TRIGLYCERIDE OVER 400 mg/dL 05/14/2020 1025   LDLCALC SEE COMMENT 07/30/2013 0436   LDLDIRECT 177.6 (H) 05/14/2020 1025      Lab Results  Component Value Date   TSH 0.286 (L) 05/14/2020   TSH 0.789 03/26/2018   TSH 1.238 12/07/2014   TSH 3.02 08/04/2012   TSH 1.76 02/21/2011   FREET4 0.95 05/14/2020   FREET4 0.85 03/26/2018   FREET4 0.76 12/07/2014      Assessment & Plan:   1) 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  She presents today with her CGM, no logs, showing persistently high glycemic profile overall.  She was not due for another A1c  today.  Analysis of her CGM shows TIR 2%, TAR 98% (68% being very high range), TBR 0%.  She reports she is still struggling with sticking to a meal routine and often drinks sugary beverages including water additives.  She denies any hypoglycemia.   Recent labs reviewed.  - I had a long discussion with her about the progressive nature of diabetes and the pathology behind its complications. -her diabetes is complicated by carotid atherosclerosis, peripheral neuropathy, CVA, retinopathy, current smoking and she remains at a high risk for more acute and chronic complications which include CAD, CVA, CKD, retinopathy, and neuropathy. These are all discussed in detail with her.  - Nutritional counseling repeated at each appointment due to patients tendency to fall back in to old habits.  - The patient admits there is a room for improvement in their diet and drink choices. -  Suggestion is made for the patient to avoid simple carbohydrates from their diet including Cakes, Sweet Desserts / Pastries, Ice Cream, Soda (diet and regular), Sweet Tea, Candies, Chips, Cookies, Sweet Pastries, Store Bought Juices, Alcohol in Excess of 1-2 drinks a day, Artificial  Sweeteners, Coffee Creamer, and "Sugar-free" Products. This will help patient to have stable blood glucose profile and potentially avoid unintended weight gain.   - I encouraged the patient to switch to unprocessed or minimally processed complex starch and increased protein intake (animal or plant source), fruits, and vegetables.   - Patient is advised to stick to a routine mealtimes to eat 3 meals a day and avoid unnecessary snacks (to snack only to correct hypoglycemia).  - she will be scheduled with Jearld Fenton, RDN, CDE for diabetes education.  - I have approached her with the following individualized plan to manage  her diabetes and patient agrees:   -Based on her current and prevailing glycemic burden, she will need to continue on intensive  treatment with basal/bolus insulin.    She reports she has been more compliant with taking her medications since last visit.  -Based on her gross hyperglycemia, she is advised to increase her [place] to 90 uhnits SQ nightly, increase her Novolog to 24-30 units TID with meals if glucose is above 90 and she is eating (specific instructions on how to titrate her insulin dose based on glucose readings given to patient in writing).    -she is encouraged to continue monitoring blood glucose 4 times daily(with her CGM), before meals and before bed, and to call the clinic if she has readings less than 70 or greater than 300 for 3 tests in a row.  - she is warned not to take insulin without proper monitoring per orders. - Adjustment parameters are given to her for hypo and hyperglycemia in writing.  - she reports that she does not tolerate Metformin, GLP-1 receptor agonists.  She has severe hypertriglyceridemia, at risk for pancreatitis from GLP-1 receptor agonist or DPP 4 inhibitors.  She also recalls she did not tolerate glipizide during prior attempt.    - Specific targets for  A1c;  LDL, HDL,  and Triglycerides were discussed with the patient.  2) Blood Pressure /Hypertension:  Her blood pressure is not controlled to target.  she is advised to continue her current medications including Hydrochlorothiazide 25 mg p.o. daily , Metoprolol 50 mg p.o. daily.    3) Lipids/Hyperlipidemia:    Her most recent lipid panel from 05/14/20 shows uncontrolled hypertriglyceridemia of 757.  She does not tolerate stating medications.  She is advised to continue Gemfibrozil 600 mg po twice daily.  4)  Weight/Diet:  Her Body mass index is 31.48 kg/m.  -   clearly complicating her diabetes care.   she is  a candidate for weight loss. I discussed with her the fact that loss of 5 - 10% of her  current body weight will have the most impact on her diabetes management.  Exercise, and detailed carbohydrates information  provided  -  detailed on discharge instructions.  5) Chronic Care/Health Maintenance: -she is not on ACEI/ARB and Statin medications and  is encouraged to initiate and continue to follow up with Ophthalmology, Dentist,  Podiatrist at least yearly or according to recommendations, and advised to  quit smoking. I have recommended yearly flu vaccine and pneumonia vaccine at least every 5 years; moderate intensity exercise for up to 150 minutes weekly; and  sleep for at least 7 hours a day.  - she is  advised to maintain close follow up with Lavera Guise, MD for primary care needs, as well as her other providers for optimal and coordinated care.  -Regarding her abnormal ABI, she will be referred to vascular specialist  for further evaluation and treatment.   I spent 45 minutes in the care of the patient today including review of labs from [place], Lipids, Thyroid Function, Hematology (current and previous including abstractions from other facilities); face-to-face time discussing  her blood glucose readings/logs, discussing hypoglycemia and hyperglycemia episodes and symptoms, medications doses, her options of short and long term treatment based on the latest standards of care / guidelines;  discussion about incorporating lifestyle medicine;  and documenting the encounter.    Please refer to Patient Instructions for Blood Glucose Monitoring and Insulin/Medications Dosing Guide"  in media tab for additional information. Please  also refer to " Patient Self Inventory" in the Media  tab for reviewed elements of pertinent patient history.  Morgan Daniels participated in the discussions, expressed understanding, and voiced agreement with the above plans.  All questions were answered to her satisfaction. she is encouraged to contact clinic should she have any questions or concerns prior to her return visit.  Follow up plan: - Return in about 2 months (around 10/29/2020) for Diabetes F/U with A1c in office, Bring  meter and logs, No previsit labs.  Rayetta Pigg, [facility] [address] [place], [place] [address] Phone: [phone] Fax: [phone]  08/28/2020, 4:14 PM

## 2020-08-28 NOTE — Progress Notes (Signed)
Medical Nutrition Therapy  Follow up  Appointment Start time:  8099   Appointment End time:  1645  Primary concerns today: Diabetes Type 2 Referral diagnosis: E11.8, E66.9 Preferred learning style:   no preference indicated Learning readiness: contemplating. No real changes.   NUTRITION ASSESSMENT   Just found out she has cataracts. Changes made: She notes she has been trying to eat more vegetables and is now injecting insulin in her stomach.  Elenor Legato shows her BS are still high-96% of BS are above target -250's and higher Still smoking  Saw [employer] today. Increased Tresiba to 90 units. Taking Novolog  20 units with meals and sliding . Sometimes forgets insulin at times. . A1C 10.9%. Has low energy. Not motivated to cook or be active. Admits to being mildly depressed. Stressed about family issues and life in general. Has a lot of arthritis/inflammation that limits her activity due to pain. Elenor Legato shows her BS are consistently high; only 1% in range.  Anthropometrics  Wt Readings from Last 3 Encounters:  08/28/20 200 lb (90.7 kg)  08/28/20 201 lb (91.2 kg)  08/28/20 195 lb 12.8 oz (88.8 kg)   Ht Readings from Last 3 Encounters:  08/28/20 5\' 7"  (1.702 m)  08/28/20 5\' 7"  (1.702 m)  08/28/20 5\' 7"  (1.702 m)   Body mass index is 31.32 kg/m. @BMIFA @ Facility age limit for growth percentiles is 20 years. Facility age limit for growth percentiles is 20 years.   Clinical Medical Hx: DM Type 2 Medications: Has LIbre. Tresiba increased to 80 units at night, Novolog 20 units +  sliding scale with meals. Suppose to be taking pancrease with meals Labs:  Lab Results  Component Value Date   HGBA1C 10.9 (A) 08/02/2020    CMP Latest Ref Rng & Units 05/14/2020 02/03/2019 02/03/2019  Glucose 70 - 99 mg/dL 281(H) 289(H) 285(H)  BUN 6 - 20 mg/dL 15 20 17   Creatinine 0.44 - 1.00 mg/dL 0.60 0.50 0.60  Sodium 135 - 145 mmol/L 133(L) 133(L) 134(L)  Potassium 3.5 - 5.1 mmol/L 4.1 4.2  4.5  Chloride 98 - 111 mmol/L 97(L) 96(L) 95(L)  CO2 22 - 32 mmol/L 26 - 26  Calcium 8.9 - 10.3 mg/dL 9.5 - 9.4  Total Protein 6.5 - 8.1 g/dL 7.9 - 7.7  Total Bilirubin 0.3 - 1.2 mg/dL 0.8 - 1.1  Alkaline Phos 38 - 126 U/L 66 - 75  AST 15 - 41 U/L 22 - 41  ALT 0 - 44 U/L 20 - 30   Lipid Panel     Component Value Date/Time   CHOL 360 (H) 05/14/2020 1025   CHOL 211 (H) 07/30/2013 0436   TRIG 757 (H) 05/14/2020 1025   TRIG 607 (H) 07/30/2013 0436   HDL 36 (L) 05/14/2020 1025   HDL 24 (L) 07/30/2013 0436   CHOLHDL 10.0 05/14/2020 1025   VLDL UNABLE TO CALCULATE IF TRIGLYCERIDE OVER 400 mg/dL 05/14/2020 1025   VLDL SEE COMMENT 07/30/2013 0436   LDLCALC UNABLE TO CALCULATE IF TRIGLYCERIDE OVER 400 mg/dL 05/14/2020 1025   LDLCALC SEE COMMENT 07/30/2013 0436   LDLDIRECT 177.6 (H) 05/14/2020 1025   Sees Dr. Stan Head, [facility].  Notable Signs/Symptoms: Fatigue, blurry vision, increased thirst, frequent urination, craving for carbs.   Lifestyle & Dietary Hx Admits to previously not taking medications and eating wrong foods. Willing to make changes.  Testing blood sugars more often but still misses quite a few times. Craves carbs and sweets.  Estimated daily fluid  intake: 24 oz Supplements:  Sleep: 6 Stress / self-care: life Current average weekly physical activity: ADL   24-Hr Dietary Recall First Meal: 3 eggs, salsa on w slices whole toast. Snack:  Second Meal: Chicken sandwich on white , unsweet tea Snack: crunchy chips  Third Meal: wasn't hungry Snack:  Beverages: misc water, tea, diet sodas.  Estimated Energy Needs Calories: 1200 Carbohydrate: 135g Protein: 90g Fat: 35g   NUTRITION DIAGNOSIS  NB-1.1 Food and nutrition-related knowledge deficit As related to Diabetes Type 2.  As evidenced by A1C 10.6%.   NUTRITION INTERVENTION  Nutrition education (E-1) on the following topics:  Focused on meal planning, CHO counting, timing of meals,  importance of insulin and complications associated with uncontrolled DM including Heart Attack and Stroke risk Need for smoking cessation  Handouts Provided Include  Lifestyle medicine Diabetes Instructions   Learning Style & Readiness for Change Teaching method utilized: Visual & Auditory  Demonstrated degree of understanding via: Teach Back  Barriers to learning/adherence to lifestyle change: motivation/depression  Goals Established by Pt Goals  Take insulins as prescribed Increase lower carb vegetables Drink a gallon of water per day Get BS less than 180 in am and less than 200 before bed.  MONITORING & EVALUATION Dietary intake, weekly physical activity, and blood sugars in 1 month.  Next Steps  Patient is to work on better meal planning..  She may benefit from referral to mental health counseling and better management of her depression.

## 2020-09-19 ENCOUNTER — Other Ambulatory Visit (INDEPENDENT_AMBULATORY_CARE_PROVIDER_SITE_OTHER): Payer: Self-pay | Admitting: Nurse Practitioner

## 2020-09-19 ENCOUNTER — Other Ambulatory Visit: Payer: Self-pay | Admitting: Nurse Practitioner

## 2020-09-19 DIAGNOSIS — M7540 Impingement syndrome of unspecified shoulder: Secondary | ICD-10-CM

## 2020-09-19 DIAGNOSIS — R6889 Other general symptoms and signs: Secondary | ICD-10-CM

## 2020-09-20 ENCOUNTER — Other Ambulatory Visit: Payer: Self-pay | Admitting: "Endocrinology

## 2020-09-21 ENCOUNTER — Ambulatory Visit (INDEPENDENT_AMBULATORY_CARE_PROVIDER_SITE_OTHER): Payer: Medicare Other

## 2020-09-21 ENCOUNTER — Other Ambulatory Visit: Payer: Self-pay

## 2020-09-21 ENCOUNTER — Encounter (INDEPENDENT_AMBULATORY_CARE_PROVIDER_SITE_OTHER): Payer: Medicare Other

## 2020-09-21 ENCOUNTER — Ambulatory Visit (INDEPENDENT_AMBULATORY_CARE_PROVIDER_SITE_OTHER): Payer: Medicare Other | Admitting: Nurse Practitioner

## 2020-09-21 VITALS — BP 145/76 | HR 84 | Ht 67.0 in | Wt 194.0 lb

## 2020-09-21 DIAGNOSIS — I739 Peripheral vascular disease, unspecified: Secondary | ICD-10-CM | POA: Diagnosis not present

## 2020-09-21 DIAGNOSIS — I6523 Occlusion and stenosis of bilateral carotid arteries: Secondary | ICD-10-CM

## 2020-09-21 DIAGNOSIS — E7800 Pure hypercholesterolemia, unspecified: Secondary | ICD-10-CM | POA: Diagnosis not present

## 2020-09-21 DIAGNOSIS — F17200 Nicotine dependence, unspecified, uncomplicated: Secondary | ICD-10-CM | POA: Diagnosis not present

## 2020-09-21 DIAGNOSIS — R6889 Other general symptoms and signs: Secondary | ICD-10-CM

## 2020-10-01 ENCOUNTER — Encounter (INDEPENDENT_AMBULATORY_CARE_PROVIDER_SITE_OTHER): Payer: Self-pay | Admitting: Nurse Practitioner

## 2020-10-01 ENCOUNTER — Other Ambulatory Visit: Payer: Self-pay | Admitting: Nurse Practitioner

## 2020-10-01 DIAGNOSIS — M064 Inflammatory polyarthropathy: Secondary | ICD-10-CM

## 2020-10-01 NOTE — Progress Notes (Signed)
Subjective:    Patient ID: Morgan Daniels, female    DOB: [birth year], 60 y.o.   MRN: [id number] Chief Complaint  Patient presents with   New Patient (Initial Visit)    NP abnormal [place] from  [facility]. Abi and consult . Reffered by reardon whitney    Morgan Daniels is a 60 year old female that presents today for evaluation after having abnormal ABIs done at her endocrinologist office.  At the endocrinologist office and indicated that she had peripheral arterial disease.  The patient does endorse having claudication-like symptoms that tends to be worse when she is going upstairs.  She also has episodes where her legs give out but this is following 2 back surgeries.  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  She also has some swelling in her right lower extremity but also has more noticeable varicosities in the right lower extremity.  She denies any rest pain like symptoms.  She denies any wounds or ulcerations.  Today noninvasive studies show an ABI of 1.06 on the right and 0.65 on the left.  The patient has triphasic waveforms bilaterally down her popliteal artery.  In the right leg the anterior tibial waveforms are hyperemic with monophasic waveforms in the posterior tibial.  There is collateral circulation but there is no flow near the ankle.  Via duplex.  However she does have good toe waveforms going down to the right great toe with a TBI 0.74.  The left lower extremity has monophasic tibial arteries also with no flow at the ankle area via duplex.  The TBI is 0.37 with dampened toe waveforms on the left   Review of Systems  Cardiovascular:  Positive for leg swelling.  All other systems reviewed and are negative.     Objective:   Physical Exam HENT:     Head: Normocephalic.  Cardiovascular:     Rate and Rhythm: Normal rate.     Comments: Nonpalpable pedal pulses Pulmonary:      Effort: Pulmonary effort is normal.  Skin:    General: Skin is warm and dry.  Neurological:     Mental Status: She is alert and oriented to person, place, and time.  Psychiatric:        Mood and Affect: Mood normal.        Behavior: Behavior normal.        Thought Content: Thought content normal.        Judgment: Judgment normal.    BP (!) 145/76   Pulse 84   Ht '5\' 7"'$  (1.702 m)   Wt 194 lb (88 kg)   BMI 30.38 kg/m   Past Medical History:  Diagnosis Date   Anxiety    Arthritis    Carotid artery occlusion    Chronic kidney disease May 2017   UTI   Depression    Diabetes ([place])    Diverticulosis    Fatty liver    Fibromyalgia    GERD (gastroesophageal reflux disease)    H/O hiatal hernia    Hypertension    IBS (irritable bowel syndrome)    Peptic ulcer    Plantar fasciitis    Stroke [facility]) Dec. 14,2013   Right side-ministroke    Social History   Socioeconomic History   Marital status: Married    Spouse name: Not on file   Number of children: Not on file   Years of education: Not  on file   Highest education level: Not on file  Occupational History   Not on file  Tobacco Use   Smoking status: Every Day    Packs/day: 1.00    Years: 40.00    Pack years: 40.00    Types: Cigarettes    Start date: 11/09/1970   Smokeless tobacco: Never   Tobacco comments:       Vaping Use   Vaping Use: Former  Substance and Sexual Activity   Alcohol use: Yes    Alcohol/week: 0.0 standard drinks    Comment: rarely   Drug use: No   Sexual activity: Not Currently    Partners: Male  Other Topics Concern   Not on file  Social History Narrative   Not on file   Social Determinants of Health   Financial Resource Strain: Not on file  Food Insecurity: Not on file  Transportation Needs: Not on file  Physical Activity: Not on file  Stress: Not on file  Social Connections: Not on file  Intimate Partner Violence: Not on file    Past Surgical History:  Procedure Laterality  Date   [place]  2000, 2004   CAROTID ENDARTERECTOMY Left 05/06/12   CARPAL TUNNEL RELEASE Left 07/18/2015   Procedure: CARPAL TUNNEL RELEASE;  Surgeon: Earnestine Leys, MD;  Location: [facility] ORS;  Service: Orthopedics;  Laterality: Left;   CEREBRAL ANGIOGRAM Bilateral 05/03/2012   Procedure: CEREBRAL ANGIOGRAM;  Surgeon: Angelia Mould, MD;  Location[facility] CATH LAB;  Service: Cardiovascular;  Laterality: Bilateral;   CHOLECYSTECTOMY  2001   COLONOSCOPY WITH PROPOFOL N/A 11/19/2018   Procedure: COLONOSCOPY WITH PROPOFOL;  Surgeon: Lucilla Lame, MD;  Location: [place];  Service: Endoscopy;  Laterality: N/A;  Diabetic - insulin   ENDARTERECTOMY Left 05/06/2012   Procedure: ENDARTERECTOMY CAROTID;  Surgeon: Angelia Mould, MD;  Location: [place];  Service: Vascular;  Laterality: Left;   ENDARTERECTOMY Right 08/09/2013   Procedure: ENDARTERECTOMY CAROTID-RIGHT;  Surgeon: Angelia Mould, MD;  Location: [place];  Service: Vascular;  Laterality: Right;   HERNIA REPAIR     PATCH ANGIOPLASTY Left 05/06/2012   Procedure: WITH DACRON PATCH ANGIOPLASTY ;  Surgeon: Angelia Mould, MD;  Location: [place];  Service: Vascular;  Laterality: Left;   POLYPECTOMY  11/19/2018   Procedure: POLYPECTOMY;  Surgeon: Lucilla Lame, MD;  Location: [place];  Service: Endoscopy;;   SPINE SURGERY  2004   TONSILLECTOMY     TUBAL LIGATION      Family History  Problem Relation Age of Onset   Hypertension Mother    Hyperlipidemia Mother    Deep vein thrombosis Mother    Cancer Father    Alcohol abuse Father    Heart failure Father    Hypertension Maternal Grandmother    Deep vein thrombosis Sister    Alcohol abuse Sister    Alcohol abuse Sister     Allergies  Allergen Reactions   Simvastatin Diarrhea and Nausea And Vomiting   Morphine And Related Itching   Milk-Related Compounds Diarrhea    bloating   Latex Rash    Rash when she wears  gloves (Negative by test - per pt)    CBC Latest Ref Rng & Units 05/14/2020 02/03/2019 02/03/2019  WBC 4.0 - 10.5 K/uL 7.4 - 9.5  Hemoglobin 12.0 - 15.0 g/dL 15.0 16.3(H) 15.9(H)  Hematocrit 36.0 - 46.0 % 43.3 48.0(H) 46.0  Platelets 150 - 400 K/uL 276 - 299  CMP     Component Value Date/Time   NA 133 (L) 05/14/2020 1025   NA 138 07/30/2013 0436   K 4.1 05/14/2020 1025   K 4.0 07/30/2013 0436   CL 97 (L) 05/14/2020 1025   CL 103 07/30/2013 0436   CO2 26 05/14/2020 1025   CO2 26 07/30/2013 0436   GLUCOSE 281 (H) 05/14/2020 1025   GLUCOSE 109 (H) 07/30/2013 0436   BUN 15 05/14/2020 1025   BUN 15 07/30/2013 0436   CREATININE 0.60 05/14/2020 1025   CREATININE 0.67 07/30/2013 0436   CALCIUM 9.5 05/14/2020 1025   CALCIUM 8.8 07/30/2013 0436   PROT 7.9 05/14/2020 1025   PROT 7.8 07/29/2013 0650   ALBUMIN 4.0 05/14/2020 1025   ALBUMIN 3.5 07/29/2013 0650   AST 22 05/14/2020 1025   AST 25 07/29/2013 0650   ALT 20 05/14/2020 1025   ALT 38 07/29/2013 0650   ALKPHOS 66 05/14/2020 1025   ALKPHOS 83 07/29/2013 0650   BILITOT 0.8 05/14/2020 1025   BILITOT 0.2 07/29/2013 0650   GFRNONAA >60 05/14/2020 1025   GFRNONAA >60 07/30/2013 0436   GFRAA >60 02/03/2019 1126   GFRAA >60 07/30/2013 0436     VAS [place] ABI WITH/WO TBI  Result Date: 09/25/2020  LOWER EXTREMITY DOPPLER STUDY Patient Name:  DANDRA EKMAN  Date of Exam:   09/21/2020 Medical Rec #: [id number]       Accession #:    [id number] Date of Birth: October 25, 1960       Patient Gender: F Patient Age:   2 years Exam Location:  [facility] Procedure:      VAS [place] ABI WITH/WO TBI Referring Phys: Eulogio Ditch --------------------------------------------------------------------------------  Indications: Peripheral artery disease.  Performing Technologist: Blondell Reveal RT, RDMS, RVT  Examination Guidelines: A complete evaluation includes at minimum, Doppler waveform signals and systolic blood pressure reading at the level of  bilateral brachial, anterior tibial, and posterior tibial arteries, when vessel segments are accessible. Bilateral testing is considered an integral part of a complete examination. Photoelectric Plethysmograph (PPG) waveforms and toe systolic pressure readings are included as required and additional duplex testing as needed. Limited examinations for reoccurring indications may be performed as noted.  ABI Findings: +---------+------------------+-----+----------+--------------------------------+ Right    Rt Pressure (mmHg)IndexWaveform  Comment                          +---------+------------------+-----+----------+--------------------------------+ Brachial 139                                                               +---------+------------------+-----+----------+--------------------------------+ Popliteal                       triphasic                                  +---------+------------------+-----+----------+--------------------------------+ ATA      151                    hyperemic                                  +---------+------------------+-----+----------+--------------------------------+  PTA      106               0.75 monophasiccollateral; no flow near ankle                                             via duplex                       +---------+------------------+-----+----------+--------------------------------+ Great Toe105               0.74 Normal                                     +---------+------------------+-----+----------+--------------------------------+ +---------+------------------+-----+----------+-------------------------------+ Left     Lt Pressure (mmHg)IndexWaveform  Comment                         +---------+------------------+-----+----------+-------------------------------+ Brachial 142                                                               +---------+------------------+-----+----------+-------------------------------+ Popliteal                       triphasic                                 +---------+------------------+-----+----------+-------------------------------+ ATA      93                     monophasic                                +---------+------------------+-----+----------+-------------------------------+ PTA      92                0.65 monophasicno flow at ankle level via                                                duplex                          +---------+------------------+-----+----------+-------------------------------+ Great Toe52                0.37 Abnormal                                  +---------+------------------+-----+----------+-------------------------------+ +-------+-----------+-----------+------------+------------+ ABI/TBIToday's ABIToday's TBIPrevious ABIPrevious TBI +-------+-----------+-----------+------------+------------+ Right  1.06                                           +-------+-----------+-----------+------------+------------+ Left   0.65                                           +-------+-----------+-----------+------------+------------+  Summary: Right: Resting right ankle-brachial index is within normal range with evidence of moderate posterior tibial level arterial disease. The right toe-brachial index is normal. Left: Resting left ankle-brachial index and Doppler waveforms indicate moderate left lower extremity arterial disease at the tibial level. The left toe-brachial index is abnormal.  *See table(s) above for measurements and observations. Electronically signed by Leotis Pain MD on 09/25/2020 at 10:14:37 AM.    Final        Assessment & Plan:   1. PAD (peripheral artery disease) ([place]) Today the patient does have evidence of PAD bilaterally with the left worse than the right.  Although the patient does have claudication symptoms she does not  have limb threatening symptoms currently.  We discussed signs and symptoms of worsening PAD such as the development of rest pain, worsening claudication or the development of wounds or ulcerations.  We will continue with conservative observation have the patient return in 3 months with noninvasive studies. - VAS [place] ABI WITH/WO TBI; Future - VAS [place] LOWER EXTREMITY ARTERIAL DUPLEX; Future  2. Bilateral carotid artery stenosis The patient has had bilateral carotid endarterectomies in 2014 and 2015.  They have not been followed in some time.  I discussed with the patient that although she has had carotid endarterectomies due to her continued smoking it is possible that these areas can restenosis over time.  We will have her follow-up with noninvasive studies at her upcoming visit. - VAS US CAROTID; Future  3. TOBACCO ABUSE Smoking cessation was discussed, 3-10 minutes spent on this topic specifically   4. HYPERCHOLESTEROLEMIA Continue statin as ordered and reviewed, no changes at this time    Current Outpatient Medications on File Prior to Visit  Medication Sig Dispense Refill   Cholecalciferol (VITAMIN D-3) 5000 UNITS TABS Take 5,000 Units by mouth daily.      Continuous Blood Gluc Receiver (FREESTYLE LIBRE 2 READER) DEVI As directed 1 each 0   Continuous Blood Gluc Sensor (FREESTYLE LIBRE 2 SENSOR) MISC Use to test BG qid. Dx E11.65 2 each 3   diphenoxylate-atropine (LOMOTIL) 2.5-0.025 MG tablet Take 1 tablet by mouth 3 (three) times daily as needed for diarrhea or loose stools. 30 tablet 0   fluconazole (DIFLUCAN) 150 MG tablet Take 1 tab po once daily 3 tablet 0   FLUoxetine (PROZAC) 40 MG capsule Take 2 capsules (80 mg total) by mouth daily. 180 capsule 1   gabapentin (NEURONTIN) 600 MG tablet TAKE 1 TABLET BY MOUTH THREE TIMES A DAY. 270 tablet 2   gemfibrozil (LOPID) 600 MG tablet Take 1 tablet (600 mg total) by mouth 2 (two) times daily before a meal. 180 tablet 1   hydrochlorothiazide  (HYDRODIURIL) 25 MG tablet TAKE 1 TABLET BY MOUTH DAILY FOR BLOOD PRESSURE/EDEMA 90 tablet 1   insulin aspart (NOVOLOG FLEXPEN) 100 UNIT/ML FlexPen Inject 20-26 Units into the skin 3 (three) times daily before meals. 30 mL 2   insulin degludec (TRESIBA FLEXTOUCH) 200 UNIT/ML FlexTouch Pen Inject 80 Units into the skin at bedtime. 30 mL 2   Insulin Pen Needle (ADVOCATE INSULIN PEN NEEDLES) 33G X 4 MM MISC To use with insulin dosing TID prior to meals and QHS for basal insulin. 300 each 1   metoprolol succinate (TOPROL-XL) 50 MG 24 hr tablet Take 1 tablet (50 mg total) by mouth daily. Take with or immediately following a meal. 30 tablet 5   omeprazole (PRILOSEC) 20 MG capsule Take 20 mg by mouth daily as needed.  ondansetron (ZOFRAN ODT) 8 MG disintegrating tablet Take 1 tablet (8 mg total) by mouth every 8 (eight) hours as needed for nausea. 30 tablet 2   ONETOUCH DELICA LANCETS FINE MISC CHECK BLOOD SUGAR THREE TIMES A DAY  5   ONETOUCH VERIO test strip CHECK BLOOD SUGAR THREE TIMES A DAY  5   Probiotic Product (PROBIOTIC PO) Take 1 tablet by mouth daily in the afternoon.     Tdap (BOOSTRIX) 5-2.5-18.5 LF-MCG/0.5 injection Inject 0.5 mLs into the muscle once.     tiZANidine (ZANAFLEX) 4 MG tablet TAKE 1 TABLET(S) BY MOUTH TWICE A DAY AS NEEDED FOR MUSCLE PAIN AND SPASMS 60 tablet 1   traMADol (ULTRAM) 50 MG tablet Take 1 tablet (50 mg total) by mouth 3 (three) times daily as needed for moderate pain or severe pain. 90 tablet 0   No current facility-administered medications on file prior to visit.    There are no Patient Instructions on file for this visit. No follow-ups on file.   Kris Hartmann, NP

## 2020-10-01 NOTE — Telephone Encounter (Signed)
Please send rx last seen 7/12 and just 1 refill

## 2020-10-02 ENCOUNTER — Telehealth: Payer: Self-pay

## 2020-10-02 NOTE — Telephone Encounter (Signed)
Can you please review and send  

## 2020-10-02 NOTE — Telephone Encounter (Signed)
Lmom that pt need appt in 1 month to refills her med with [facility]

## 2020-10-16 ENCOUNTER — Other Ambulatory Visit: Payer: Self-pay | Admitting: Nurse Practitioner

## 2020-10-16 DIAGNOSIS — E1142 Type 2 diabetes mellitus with diabetic polyneuropathy: Secondary | ICD-10-CM

## 2020-10-16 DIAGNOSIS — M7540 Impingement syndrome of unspecified shoulder: Secondary | ICD-10-CM

## 2020-10-17 ENCOUNTER — Telehealth: Payer: Self-pay

## 2020-10-17 DIAGNOSIS — E1142 Type 2 diabetes mellitus with diabetic polyneuropathy: Secondary | ICD-10-CM

## 2020-10-17 MED ORDER — ADVOCATE INSULIN PEN NEEDLES 33G X 4 MM MISC: 2 refills | Status: DC

## 2020-10-17 NOTE — Telephone Encounter (Signed)
Patient states she needs pen needles called in for her Novolog and her [place]. [facility]

## 2020-10-17 NOTE — Telephone Encounter (Signed)
Rx sent 

## 2020-10-29 ENCOUNTER — Encounter: Payer: Medicare Other | Attending: "Endocrinology | Admitting: Nutrition

## 2020-10-29 ENCOUNTER — Encounter: Payer: Self-pay | Admitting: "Endocrinology

## 2020-10-29 ENCOUNTER — Ambulatory Visit (INDEPENDENT_AMBULATORY_CARE_PROVIDER_SITE_OTHER): Payer: Medicare Other | Admitting: "Endocrinology

## 2020-10-29 ENCOUNTER — Other Ambulatory Visit: Payer: Self-pay

## 2020-10-29 ENCOUNTER — Ambulatory Visit: Payer: Medicare Other | Admitting: "Endocrinology

## 2020-10-29 VITALS — BP 152/86 | HR 80 | Ht 67.0 in | Wt 194.0 lb

## 2020-10-29 DIAGNOSIS — E782 Mixed hyperlipidemia: Secondary | ICD-10-CM

## 2020-10-29 DIAGNOSIS — E1142 Type 2 diabetes mellitus with diabetic polyneuropathy: Secondary | ICD-10-CM

## 2020-10-29 DIAGNOSIS — Z789 Other specified health status: Secondary | ICD-10-CM | POA: Diagnosis not present

## 2020-10-29 DIAGNOSIS — E669 Obesity, unspecified: Secondary | ICD-10-CM | POA: Diagnosis not present

## 2020-10-29 DIAGNOSIS — F17200 Nicotine dependence, unspecified, uncomplicated: Secondary | ICD-10-CM

## 2020-10-29 DIAGNOSIS — Z794 Long term (current) use of insulin: Secondary | ICD-10-CM | POA: Diagnosis not present

## 2020-10-29 DIAGNOSIS — Z79899 Other long term (current) drug therapy: Secondary | ICD-10-CM | POA: Insufficient documentation

## 2020-10-29 DIAGNOSIS — I6523 Occlusion and stenosis of bilateral carotid arteries: Secondary | ICD-10-CM | POA: Diagnosis not present

## 2020-10-29 DIAGNOSIS — I10 Essential (primary) hypertension: Secondary | ICD-10-CM

## 2020-10-29 DIAGNOSIS — F17210 Nicotine dependence, cigarettes, uncomplicated: Secondary | ICD-10-CM | POA: Diagnosis not present

## 2020-10-29 DIAGNOSIS — E785 Hyperlipidemia, unspecified: Secondary | ICD-10-CM | POA: Insufficient documentation

## 2020-10-29 DIAGNOSIS — E118 Type 2 diabetes mellitus with unspecified complications: Secondary | ICD-10-CM | POA: Insufficient documentation

## 2020-10-29 DIAGNOSIS — E1165 Type 2 diabetes mellitus with hyperglycemia: Secondary | ICD-10-CM

## 2020-10-29 LAB — POCT GLYCOSYLATED HEMOGLOBIN (HGB A1C): HbA1c, POC (controlled diabetic range): 10.2 % — AB (ref 0.0–7.0)

## 2020-10-29 MED ORDER — NOVOLOG FLEXPEN 100 UNIT/ML ~~LOC~~ SOPN: 20.0000 [IU] | PEN_INJECTOR | Freq: Three times a day (TID) | SUBCUTANEOUS | 2 refills | Status: DC

## 2020-10-29 MED ORDER — TRESIBA FLEXTOUCH 200 UNIT/ML ~~LOC~~ SOPN: 100.0000 [IU] | PEN_INJECTOR | Freq: Every day | SUBCUTANEOUS | 2 refills | Status: DC

## 2020-10-29 NOTE — Progress Notes (Signed)
Medical Nutrition Therapy  Follow up  [facility]  Primary concerns today: Diabetes Type 2 Referral diagnosis: E11.8, E66.9 Preferred learning style:   no preference indicated Learning readiness: contemplating. No real changes.   NUTRITION ASSESSMENT   Just found out she has cataracts. Changes made: She notes she has been trying to eat more vegetables and is now injecting insulin in her stomach.  Elenor Legato shows her BS are still high-96% of BS are above target -250's and higher Still smoking  Saw [employer] today. Increased Tresiba to 90 units. Taking Novolog  20 units with meals and sliding . Sometimes forgets insulin at times. . A1C 10.9%. Has low energy. Not motivated to cook or be active. Admits to being mildly depressed. Stressed about family issues and life in general. Has a lot of arthritis/inflammation that limits her activity due to pain.  Hyperlipidemia. Is on Gemfibrozil.  Anthropometrics  Wt Readings from Last 3 Encounters:  09/21/20 194 lb (88 kg)  08/28/20 200 lb (90.7 kg)  08/28/20 201 lb (91.2 kg)   Ht Readings from Last 3 Encounters:  09/21/20 '5\' 7"'$  (1.702 m)  08/28/20 '5\' 7"'$  (1.702 m)  08/28/20 '5\' 7"'$  (1.702 m)   There is no height or weight on file to calculate BMI. '@BMIFA'$ @ Facility age limit for growth percentiles is 20 years. Facility age limit for growth percentiles is 20 years.   Clinical Medical Hx: DM Type 2 Medications: Has LIbre. Tresiba increased to 80 units at night, Novolog 20 units +  sliding scale with meals. Suppose to be taking pancrease with meals Labs:  Lab Results  Component Value Date   HGBA1C 10.9 (A) 08/02/2020    CMP Latest Ref Rng & Units 05/14/2020 02/03/2019 02/03/2019  Glucose 70 - 99 mg/dL 281(H) 289(H) 285(H)  BUN 6 - 20 mg/dL '15 20 17  '$ Creatinine 0.44 - 1.00 mg/dL 0.60 0.50 0.60  Sodium 135 - 145 mmol/L 133(L) 133(L) 134(L)  Potassium 3.5 - 5.1 mmol/L 4.1 4.2 4.5  Chloride 98 - 111 mmol/L 97(L) 96(L) 95(L)   CO2 22 - 32 mmol/L 26 - 26  Calcium 8.9 - 10.3 mg/dL 9.5 - 9.4  Total Protein 6.5 - 8.1 g/dL 7.9 - 7.7  Total Bilirubin 0.3 - 1.2 mg/dL 0.8 - 1.1  Alkaline Phos 38 - 126 U/L 66 - 75  AST 15 - 41 U/L 22 - 41  ALT 0 - 44 U/L 20 - 30   Lipid Panel     Component Value Date/Time   CHOL 360 (H) 05/14/2020 1025   CHOL 211 (H) 07/30/2013 0436   TRIG 757 (H) 05/14/2020 1025   TRIG 607 (H) 07/30/2013 0436   HDL 36 (L) 05/14/2020 1025   HDL 24 (L) 07/30/2013 0436   CHOLHDL 10.0 05/14/2020 1025   VLDL UNABLE TO CALCULATE IF TRIGLYCERIDE OVER 400 mg/dL 05/14/2020 1025   VLDL SEE COMMENT 07/30/2013 0436   LDLCALC UNABLE TO CALCULATE IF TRIGLYCERIDE OVER 400 mg/dL 05/14/2020 1025   LDLCALC SEE COMMENT 07/30/2013 0436   LDLDIRECT 177.6 (H) 05/14/2020 1025   Sees Dr. Stan Head, [facility].  Notable Signs/Symptoms: Fatigue, blurry vision, increased thirst, frequent urination, craving for carbs.   Lifestyle & Dietary Hx Admits to previously not taking medications and eating wrong foods. Willing to make changes.  Testing blood sugars more often but still misses quite a few times. Craves carbs and sweets.  Estimated daily fluid intake: 24 oz Supplements:  Sleep: 6 Stress / self-care: life  Current average weekly physical activity: ADL   24-Hr Dietary Recall First Meal: 3 eggs, salsa on w slices whole toast. Snack:  Second Meal: Chicken sandwich on white , unsweet tea Snack: crunchy chips  Third Meal: wasn't hungry Snack:  Beverages: misc water, tea, diet sodas.  Estimated Energy Needs Calories: 1200 Carbohydrate: 135g Protein: 90g Fat: 35g   NUTRITION DIAGNOSIS  NB-1.1 Food and nutrition-related knowledge deficit As related to Diabetes Type 2.  As evidenced by A1C 10.6%.   NUTRITION INTERVENTION  Nutrition education (E-1) on the following topics:  Focused on meal planning, CHO counting, timing of meals, importance of insulin and complications associated  with uncontrolled DM including Heart Attack and Stroke risk Need for smoking cessation  Handouts Provided Include  Lifestyle medicine Diabetes Instructions   Learning Style & Readiness for Change Teaching method utilized: Visual & Auditory  Demonstrated degree of understanding via: Teach Back  Barriers to learning/adherence to lifestyle change: motivation/depression  Goals Established by Pt Goals  Take insulins as prescribed Increase lower carb vegetables Drink a gallon of water per day Get BS less than 180 in am and less than 200 before bed.  MONITORING & EVALUATION Dietary intake, weekly physical activity, and blood sugars in 1 month.  Next Steps  Patient is to work on better meal planning..  She may benefit from referral to mental health counseling and better management of her depression.

## 2020-10-29 NOTE — Patient Instructions (Signed)

## 2020-10-29 NOTE — Progress Notes (Signed)
10/29/2020, 6:23 PM   Endocrinology follow-up note  Subjective:    Patient ID: Morgan Daniels, female    DOB: 04/11/1960.  KHRYSTYNA Daniels is being seen in follow-up after she was seen in consultation for management of currently uncontrolled symptomatic diabetes requested by  Lavera Guise, MD.   Past Medical History:  Diagnosis Date   Anxiety    Arthritis    Carotid artery occlusion    Chronic kidney disease May 2017   UTI   Depression    Diabetes ([place])    Diverticulosis    Fatty liver    Fibromyalgia    GERD (gastroesophageal reflux disease)    H/O hiatal hernia    Hypertension    IBS (irritable bowel syndrome)    Peptic ulcer    Plantar fasciitis    Stroke [facility]: [place] Rhonda W) Dec. 14,2013   Right side-ministroke    Past Surgical History:  Procedure Laterality Date   [place]  2000, 2004   CAROTID ENDARTERECTOMY Left 05/06/12   CARPAL TUNNEL RELEASE Left 07/18/2015   Procedure: CARPAL TUNNEL RELEASE;  Surgeon: Earnestine Leys, MD;  Location: [facility] ORS;  Service: Orthopedics;  Laterality: Left;   CEREBRAL ANGIOGRAM Bilateral 05/03/2012   Procedure: CEREBRAL ANGIOGRAM;  Surgeon: Angelia Mould, MD;  Location: [facility] CATH LAB;  Service: Cardiovascular;  Laterality: Bilateral;   CHOLECYSTECTOMY  2001   COLONOSCOPY WITH PROPOFOL N/A 11/19/2018   Procedure: COLONOSCOPY WITH PROPOFOL;  Surgeon: Lucilla Lame, MD;  Location: [place];  Service: Endoscopy;  Laterality: N/A;  Diabetic - insulin   ENDARTERECTOMY Left 05/06/2012   Procedure: ENDARTERECTOMY CAROTID;  Surgeon: Angelia Mould, MD;  Location: [place];  Service: Vascular;  Laterality: Left;   ENDARTERECTOMY Right 08/09/2013   Procedure: ENDARTERECTOMY CAROTID-RIGHT;  Surgeon: Angelia Mould, MD;  Location: [place];  Service: Vascular;  Laterality: Right;   HERNIA REPAIR     PATCH ANGIOPLASTY  Left 05/06/2012   Procedure: WITH DACRON PATCH ANGIOPLASTY ;  Surgeon: Angelia Mould, MD;  Location: [place];  Service: Vascular;  Laterality: Left;   POLYPECTOMY  11/19/2018   Procedure: POLYPECTOMY;  Surgeon: Lucilla Lame, MD;  Location: [place];  Service: Endoscopy;;   SPINE SURGERY  2004   TONSILLECTOMY     TUBAL LIGATION      Social History   Socioeconomic History   Marital status: Married    Spouse name: Not on file   Number of children: Not on file   Years of education: Not on file   Highest education level: Not on file  Occupational History   Not on file  Tobacco Use   Smoking status: Every Day    Packs/day: 1.00    Years: 40.00    Pack years: 40.00    Types: Cigarettes    Start date: 11/09/1970   Smokeless tobacco: Never   Tobacco comments:       Vaping Use   Vaping Use: Former  Substance and Sexual Activity   Alcohol use: Yes    Alcohol/week: 0.0 standard drinks  Comment: rarely   Drug use: No   Sexual activity: Not Currently    Partners: Male  Other Topics Concern   Not on file  Social History Narrative   Not on file   Social Determinants of Health   Financial Resource Strain: Not on file  Food Insecurity: Not on file  Transportation Needs: Not on file  Physical Activity: Not on file  Stress: Not on file  Social Connections: Not on file    Family History  Problem Relation Age of Onset   Hypertension Mother    Hyperlipidemia Mother    Deep vein thrombosis Mother    Cancer Father    Alcohol abuse Father    Heart failure Father    Hypertension Maternal Grandmother    Deep vein thrombosis Sister    Alcohol abuse Sister    Alcohol abuse Sister     Outpatient Encounter Medications as of 10/29/2020  Medication Sig   Cholecalciferol (VITAMIN D-3) 5000 UNITS TABS Take 5,000 Units by mouth daily.    Continuous Blood Gluc Receiver (FREESTYLE LIBRE 2 READER) DEVI As directed   Continuous Blood Gluc Sensor (FREESTYLE LIBRE 2 SENSOR)  MISC Use to test BG qid. Dx E11.65   diphenoxylate-atropine (LOMOTIL) 2.5-0.025 MG tablet Take 1 tablet by mouth 3 (three) times daily as needed for diarrhea or loose stools.   fluconazole (DIFLUCAN) 150 MG tablet Take 1 tab po once daily   FLUoxetine (PROZAC) 40 MG capsule Take 2 capsules (80 mg total) by mouth daily.   gabapentin (NEURONTIN) 600 MG tablet TAKE 1 TABLET BY MOUTH THREE TIMES A DAY.   gemfibrozil (LOPID) 600 MG tablet Take 1 tablet (600 mg total) by mouth 2 (two) times daily before a meal.   hydrochlorothiazide (HYDRODIURIL) 25 MG tablet TAKE 1 TABLET BY MOUTH DAILY FOR BLOOD PRESSURE/EDEMA   insulin aspart (NOVOLOG FLEXPEN) 100 UNIT/ML FlexPen Inject 20-26 Units into the skin 3 (three) times daily before meals.   insulin degludec (TRESIBA FLEXTOUCH) 200 UNIT/ML FlexTouch Pen Inject 100 Units into the skin at bedtime.   Insulin Pen Needle (ADVOCATE INSULIN PEN NEEDLES) 33G X 4 MM MISC To use with insulin dosing TID prior to meals and QHS for basal insulin.   metoprolol succinate (TOPROL-XL) 50 MG 24 hr tablet Take 1 tablet (50 mg total) by mouth daily. Take with or immediately following a meal.   omeprazole (PRILOSEC) 20 MG capsule Take 20 mg by mouth daily as needed.   ondansetron (ZOFRAN ODT) 8 MG disintegrating tablet Take 1 tablet (8 mg total) by mouth every 8 (eight) hours as needed for nausea.   ONETOUCH DELICA LANCETS FINE MISC CHECK BLOOD SUGAR THREE TIMES A DAY   ONETOUCH VERIO test strip CHECK BLOOD SUGAR THREE TIMES A DAY   Probiotic Product (PROBIOTIC PO) Take 1 tablet by mouth daily in the afternoon.   Tdap (BOOSTRIX) 5-2.5-18.5 LF-MCG/0.5 injection Inject 0.5 mLs into the muscle once.   tiZANidine (ZANAFLEX) 4 MG tablet TAKE 1 TABLET(S) BY MOUTH TWICE A DAY AS NEEDED FOR MUSCLE PAIN AND SPASMS   traMADol (ULTRAM) 50 MG tablet TAKE 1 TABLET BY MOUTH 3 (THREE) TIMES DAILY AS NEEDED FOR MODERATE PAIN OR SEVERE PAIN.   [DISCONTINUED] insulin aspart (NOVOLOG FLEXPEN) 100  UNIT/ML FlexPen Inject 20-26 Units into the skin 3 (three) times daily before meals. (Patient taking differently: Inject 25 Units into the skin 3 (three) times daily before meals.)   [DISCONTINUED] insulin degludec (TRESIBA FLEXTOUCH) 200 UNIT/ML FlexTouch Pen Inject 80  Units into the skin at bedtime. (Patient taking differently: Inject 90 Units into the skin at bedtime.)   No facility-administered encounter medications on file as of 10/29/2020.    ALLERGIES: Allergies  Allergen Reactions   Simvastatin Diarrhea and Nausea And Vomiting   Morphine And Related Itching   Milk-Related Compounds Diarrhea    bloating   Latex Rash    Rash when she wears gloves (Negative by test - per pt)    VACCINATION STATUS: Immunization History  Administered Date(s) Administered   Influenza Inj Mdck Quad Pf 12/30/2017, 12/20/2018, 12/23/2019   Influenza,inj,Quad PF,6+ Mos 12/30/2017   PFIZER(Purple Top)SARS-COV-2 Vaccination 05/04/2019, 05/25/2019, 02/17/2020   Td 04/18/2006    Diabetes She presents for her follow-up diabetic visit. She has type 2 diabetes mellitus. Her disease course has been worsening. There are no hypoglycemic associated symptoms. Pertinent negatives for hypoglycemia include no confusion, headaches, pallor or seizures. Associated symptoms include fatigue, polydipsia and polyuria. Pertinent negatives for diabetes include no chest pain and no polyphagia. There are no hypoglycemic complications. Symptoms are worsening. Diabetic complications include a CVA, peripheral neuropathy and retinopathy. Risk factors for coronary artery disease include dyslipidemia, diabetes mellitus, family history, tobacco exposure, sedentary lifestyle, post-menopausal and hypertension. Current diabetic treatment includes insulin injections (She is currently on Tresiba 80 units nightly, NovoLog 1-2 times a day, maximum 42 units daily.). Her weight is fluctuating minimally. She is following a generally unhealthy diet.  When asked about meal planning, she reported none. She has had a previous visit with a dietitian. She rarely participates in exercise. Her home blood glucose trend is fluctuating minimally. Her breakfast blood glucose range is generally >200 mg/dl. Her lunch blood glucose range is generally >200 mg/dl. Her dinner blood glucose range is generally >200 mg/dl. Her bedtime blood glucose range is generally >200 mg/dl. Her overall blood glucose range is >200 mg/dl. (She presents with continued, persistent hyperglycemia, point-of-care A1c only slightly better at 10.2%.  She did not document any hypoglycemia.  Her CGM analysis shows 2% time range, 98% above range.     Her last 3 measurements of A1c were   10.9, 10.6, 12.5, 9.7%.  ) An ACE inhibitor/angiotensin II receptor blocker is being taken. Eye exam is current.  Hyperlipidemia This is a chronic problem. The problem is uncontrolled. Exacerbating diseases include diabetes. Pertinent negatives include no chest pain, myalgias or shortness of breath. Current antihyperlipidemic treatment includes fibric acid derivatives. Risk factors for coronary artery disease include diabetes mellitus, dyslipidemia, hypertension, a sedentary lifestyle and post-menopausal.  Hypertension This is a chronic problem. The current episode started more than 1 year ago. Pertinent negatives include no chest pain, headaches, palpitations or shortness of breath. Risk factors for coronary artery disease include dyslipidemia, diabetes mellitus, obesity, sedentary lifestyle and smoking/tobacco exposure. Past treatments include diuretics. Hypertensive end-organ damage includes CVA and retinopathy.    Review of Systems  Constitutional:  Positive for fatigue. Negative for chills, fever and unexpected weight change.  HENT:  Negative for trouble swallowing and voice change.   Eyes:  Negative for visual disturbance.  Respiratory:  Negative for cough, shortness of breath and wheezing.    Cardiovascular:  Negative for chest pain, palpitations and leg swelling.  Gastrointestinal:  Negative for diarrhea, nausea and vomiting.  Endocrine: Positive for polydipsia and polyuria. Negative for cold intolerance, heat intolerance and polyphagia.  Musculoskeletal:  Negative for arthralgias and myalgias.  Skin:  Negative for color change, pallor, rash and wound.  Neurological:  Negative for seizures and headaches.  Psychiatric/Behavioral:  Negative for confusion and suicidal ideas.    Objective:    Vitals with BMI 10/29/2020 09/21/2020 08/28/2020  Height '5\' 7"'$  '5\' 7"'$  '5\' 7"'$   Weight 194 lbs 194 lbs 200 lbs  BMI 30.38 [id number]  Systolic 0000000 [id number] -  Diastolic 86 76 -  Pulse 80 84 -  Some encounter information is confidential and restricted. Go to Review Flowsheets activity to see all data.    BP (!) 152/86   Pulse 80   Ht '5\' 7"'$  (1.702 m)   Wt 194 lb (88 kg)   BMI 30.38 kg/m   Wt Readings from Last 3 Encounters:  10/29/20 194 lb (88 kg)  09/21/20 194 lb (88 kg)  08/28/20 200 lb (90.7 kg)     Physical Exam Constitutional:      Appearance: She is well-developed.  HENT:     Head: Normocephalic and atraumatic.  Neck:     Thyroid: No thyromegaly.     Trachea: No tracheal deviation.  Cardiovascular:     Rate and Rhythm: Normal rate and regular rhythm.  Pulmonary:     Effort: Pulmonary effort is normal.  Abdominal:     Tenderness: There is no abdominal tenderness. There is no guarding.  Musculoskeletal:        General: Normal range of motion.     Cervical back: Normal range of motion and neck supple.  Skin:    General: Skin is warm and dry.     Coloration: Skin is not pale.     Findings: No erythema or rash.  Neurological:     Mental Status: She is alert and oriented to person, place, and time.     Cranial Nerves: No cranial nerve deficit.     Coordination: Coordination normal.     Deep Tendon Reflexes: Reflexes are normal and symmetric.  Psychiatric:        Judgment:  Judgment normal.      CMP ( most recent) CMP     Component Value Date/Time   NA 133 (L) 05/14/2020 1025   NA 138 07/30/2013 0436   K 4.1 05/14/2020 1025   K 4.0 07/30/2013 0436   CL 97 (L) 05/14/2020 1025   CL 103 07/30/2013 0436   CO2 26 05/14/2020 1025   CO2 26 07/30/2013 0436   GLUCOSE 281 (H) 05/14/2020 1025   GLUCOSE 109 (H) 07/30/2013 0436   BUN 15 05/14/2020 1025   BUN 15 07/30/2013 0436   CREATININE 0.60 05/14/2020 1025   CREATININE 0.67 07/30/2013 0436   CALCIUM 9.5 05/14/2020 1025   CALCIUM 8.8 07/30/2013 0436   PROT 7.9 05/14/2020 1025   PROT 7.8 07/29/2013 0650   ALBUMIN 4.0 05/14/2020 1025   ALBUMIN 3.5 07/29/2013 0650   AST 22 05/14/2020 1025   AST 25 07/29/2013 0650   ALT 20 05/14/2020 1025   ALT 38 07/29/2013 0650   ALKPHOS 66 05/14/2020 1025   ALKPHOS 83 07/29/2013 0650   BILITOT 0.8 05/14/2020 1025   BILITOT 0.2 07/29/2013 0650   GFRNONAA >60 05/14/2020 1025   GFRNONAA >60 07/30/2013 0436   GFRAA >60 02/03/2019 1126   GFRAA >60 07/30/2013 0436     Diabetic Labs (most recent): Lab Results  Component Value Date   HGBA1C 10.2 (A) 10/29/2020   HGBA1C 10.9 (A) 08/02/2020   HGBA1C 10.6 (A) 05/04/2020     Lipid Panel ( most recent) Lipid Panel     Component Value Date/Time   CHOL 360 (H) 05/14/2020 1025  CHOL 211 (H) 07/30/2013 0436   TRIG 757 (H) 05/14/2020 1025   TRIG 607 (H) 07/30/2013 0436   HDL 36 (L) 05/14/2020 1025   HDL 24 (L) 07/30/2013 0436   CHOLHDL 10.0 05/14/2020 1025   VLDL UNABLE TO CALCULATE IF TRIGLYCERIDE OVER 400 mg/dL 05/14/2020 1025   VLDL SEE COMMENT 07/30/2013 0436   LDLCALC UNABLE TO CALCULATE IF TRIGLYCERIDE OVER 400 mg/dL 05/14/2020 1025   LDLCALC SEE COMMENT 07/30/2013 0436   LDLDIRECT 177.6 (H) 05/14/2020 1025      Lab Results  Component Value Date   TSH 0.286 (L) 05/14/2020   TSH 0.789 03/26/2018   TSH 1.238 12/07/2014   TSH 3.02 08/04/2012   TSH 1.76 02/21/2011   FREET4 0.95 05/14/2020   FREET4  0.85 03/26/2018   FREET4 0.76 12/07/2014      Assessment & Plan:   1. Type 2 diabetes mellitus with polyneuropathy (Morgan Daniels)  - REDIA TURCO has currently uncontrolled symptomatic type 2 DM since  60 years of age.  She presents with continued, persistent hyperglycemia, point-of-care A1c only slightly better at 10.2%.  She did not document any hypoglycemia.  Her CGM analysis shows 2% time range, 98% above range.    Her last 3 measurements of A1c were   10.9, 10.6, 12.5, 9.7%.      Recent labs reviewed. - I had a long discussion with her about the progressive nature of diabetes and the pathology behind its complications. -her diabetes is complicated by carotid atherosclerosis, peripheral neuropathy, CVA, retinopathy, current smoking and she remains at a high risk for more acute and chronic complications which include CAD, CVA, CKD, retinopathy, and neuropathy. These are all discussed in detail with her.  - I have counseled her on diet  and weight management  by adopting a carbohydrate restricted/protein rich diet. Patient is encouraged to switch to  unprocessed or minimally processed     complex starch and increased protein intake (animal or plant source), fruits, and vegetables. -  she is advised to stick to a routine mealtimes to eat 3 meals  a day and avoid unnecessary snacks ( to snack only to correct hypoglycemia).   - she acknowledges that there is a room for improvement in her food and drink choices. - Suggestion is made for her to avoid simple carbohydrates  from her diet including Cakes, Sweet Desserts, Ice Cream, Soda (diet and regular), Sweet Tea, Candies, Chips, Cookies, Store Bought Juices, Alcohol in Excess of  1-2 drinks a day, Artificial Sweeteners,  Coffee Creamer, and "Sugar-free" Products, Lemonade. This will help patient to have more stable blood glucose profile and potentially avoid unintended weight gain.   - she will be scheduled with Jearld Fenton, RDN, CDE for diabetes  education.  - I have approached her with the following individualized plan to manage  her diabetes and patient agrees:   -Based on her current and prevailing glycemic burden, she will need to continue on intensive treatment with basal/bolus insulin.    Admittedly, she is still missing at least half of her prandial insulin injection opportunities.  This is exactly what she needs in order for her to achieve control of diabetes to target   - She is approached to engage better for injections.  I discussed and increase her Tyler Aas to 100 units nightly, continued NovoLog at   20  units 3 times a day with meals  for pre-meal BG readings of 90-'150mg'$ /dl, plus patient specific correction dose for unexpected hyperglycemia above '150mg'$ /dl, associated  with strict monitoring of glucose 4 times a day-before meals and at bedtime. - she is warned not to take insulin without proper monitoring per orders. - Adjustment parameters are given to her for hypo and hyperglycemia in writing. - she is encouraged to call clinic for blood glucose levels less than 70 or above 200 mg /dl. - she reports that she does not tolerate Metformin, GLP-1 receptor agonists.  She has severe hypertriglyceridemia, at risk for pancreatitis from GLP-1 receptor agonist or DPP 4 inhibitors.  She so recalls she did not tolerate glipizide during prior attempt.     - Specific targets for  A1c;  LDL, HDL,  and Triglycerides were discussed with the patient.  2) Blood Pressure /Hypertension:  -Her blood pressures not controlled to target she is advised to continue her current medications including hydrochlorothiazide 12.5 mg p.o. daily , metoprolol 50 mg p.o. daily.  I will increase her hydrochlorothiazide to 25 mg for next refill.  3) Lipids/Hyperlipidemia:   Review of her recent lipid panel showed uncontrolled glycemia 757, LDL not calculated.  She was given a prescription for gemfibrozil which she is encouraged to continue to take.  She is  encouraged with her engagement with insulin treatment which will help with hypertriglyceridemia also.  She is advised to pick up gemfibrozil 600 mg p.o. twice daily.    Simvastatin is listed as one of her allergies.      4)  Weight/Diet:  Body mass index is 30.38 kg/m.  -   clearly complicating her diabetes care.   she is  a candidate for weight loss. I discussed with her the fact that loss of 5 - 10% of her  current body weight will have the most impact on her diabetes management.  Exercise, and detailed carbohydrates information provided  -  detailed on discharge instructions.  5) Chronic Care/Health Maintenance:  -she  Is not  on ACEI/ARB and Statin medications and  is encouraged to initiate and continue to follow up with Ophthalmology, Dentist,  Podiatrist at least yearly or according to recommendations, and advised to  quit smoking. I have recommended yearly flu vaccine and pneumonia vaccine at least every 5 years; moderate intensity exercise for up to 150 minutes weekly; and  sleep for at least 7 hours a day.  - she is  advised to maintain close follow up with Lavera Guise, MD for primary care needs, as well as her other providers for optimal and coordinated care.  I spent 41 minutes in the care of the patient today including review of labs from [place], Lipids, Thyroid Function, Hematology (current and previous including abstractions from other facilities); face-to-face time discussing  her blood glucose readings/logs, discussing hypoglycemia and hyperglycemia episodes and symptoms, medications doses, her options of short and long term treatment based on the latest standards of care / guidelines;  discussion about incorporating lifestyle medicine;  and documenting the encounter.    Please refer to Patient Instructions for Blood Glucose Monitoring and Insulin/Medications Dosing Guide"  in media tab for additional information. Please  also refer to " Patient Self Inventory" in the Media  tab for  reviewed elements of pertinent patient history.  [name] participated in the discussions, expressed understanding, and voiced agreement with the above plans.  All questions were answered to her satisfaction. she is encouraged to contact clinic should she have any questions or concerns prior to her return visit.   Follow up plan: - Return in about 3 months (around 01/28/2021)  for F/U with Pre-visit Labs, Meter, Logs, A1c here.Glade Lloyd, MD [facility] [address] [place], [place] [address] Phone: [phone]  Fax: [phone]    10/29/2020, 6:23 PM  This note was partially dictated with voice recognition software. Similar sounding words can be transcribed inadequately or may not  be corrected upon review.

## 2020-11-08 ENCOUNTER — Encounter: Payer: Self-pay | Admitting: Nurse Practitioner

## 2020-11-08 ENCOUNTER — Ambulatory Visit: Payer: Medicare Other | Admitting: Nurse Practitioner

## 2020-11-08 ENCOUNTER — Other Ambulatory Visit: Payer: Self-pay

## 2020-11-08 VITALS — BP 160/90 | HR 80 | Temp 98.5°F | Resp 16 | Ht 67.0 in | Wt 194.4 lb

## 2020-11-08 DIAGNOSIS — M064 Inflammatory polyarthropathy: Secondary | ICD-10-CM | POA: Diagnosis not present

## 2020-11-08 DIAGNOSIS — Z23 Encounter for immunization: Secondary | ICD-10-CM | POA: Diagnosis not present

## 2020-11-08 DIAGNOSIS — I10 Essential (primary) hypertension: Secondary | ICD-10-CM

## 2020-11-08 MED ORDER — ZOSTER VAC RECOMB ADJUVANTED 50 MCG/0.5ML IM SUSR: 0.5000 mL | Freq: Once | INTRAMUSCULAR | 0 refills | Status: AC

## 2020-11-08 MED ORDER — TRAMADOL HCL 50 MG PO TABS: 50.0000 mg | ORAL_TABLET | Freq: Three times a day (TID) | ORAL | 0 refills | Status: DC | PRN

## 2020-11-08 MED ORDER — METOPROLOL SUCCINATE ER 50 MG PO TB24: 50.0000 mg | ORAL_TABLET | Freq: Every day | ORAL | 1 refills | Status: DC

## 2020-11-08 MED ORDER — TETANUS-DIPHTH-ACELL PERTUSSIS 5-2.5-18.5 LF-MCG/0.5 IM SUSY: 0.5000 mL | PREFILLED_SYRINGE | Freq: Once | INTRAMUSCULAR | 0 refills | Status: AC

## 2020-11-08 NOTE — Progress Notes (Signed)
[facility], [place] [address]  Internal MEDICINE  Office Visit Note  Patient Name: Morgan Daniels  [id number]  [id number]  Date of Service: 11/08/2020  Chief Complaint  Patient presents with   Follow-up    refills   Depression   Diabetes   Gastroesophageal Reflux   Hypertension    HPI Morgan Daniels presents for a follow up visit for hypertension and medication refills. She has a history of depression, diabetes, GERD, hypertension, anxiety, stroke, arthritis, fibromyalgia, and diverticulosis. Her surgical history is significant for cholecystectomy, hysterectomy, hernia repair and spinal surgery. She sees endocrinology for diabetes management. She is due for pneumonia and shingles vaccine as well as tetanus booster. She is working on diet monidifications to help control her diabetes and to lose weight.  She has been working on smoking cessation and is down to 10 cigarettes per day. Her CPE is scheduled for 12/25/20. -her blood pressure is significantly elevated today, remains elevated when rechecked, see vitals. She did not take her medication today.     Current Medication: Outpatient Encounter Medications as of 11/08/2020  Medication Sig Note   Cholecalciferol (VITAMIN D-3) 5000 UNITS TABS Take 5,000 Units by mouth daily.     Continuous Blood Gluc Receiver (FREESTYLE LIBRE 2 READER) DEVI As directed    Continuous Blood Gluc Sensor (FREESTYLE LIBRE 2 SENSOR) MISC Use to test BG qid. Dx E11.65    diphenoxylate-atropine (LOMOTIL) 2.5-0.025 MG tablet Take 1 tablet by mouth 3 (three) times daily as needed for diarrhea or loose stools.    FLUoxetine (PROZAC) 40 MG capsule Take 2 capsules (80 mg total) by mouth daily.    gabapentin (NEURONTIN) 600 MG tablet TAKE 1 TABLET BY MOUTH THREE TIMES A DAY.    gemfibrozil (LOPID) 600 MG tablet Take 1 tablet (600 mg total) by mouth 2 (two) times daily before a meal.    hydrochlorothiazide (HYDRODIURIL) 25 MG tablet TAKE 1  TABLET BY MOUTH DAILY FOR BLOOD PRESSURE/EDEMA    insulin aspart (NOVOLOG FLEXPEN) 100 UNIT/ML FlexPen Inject 20-26 Units into the skin 3 (three) times daily before meals.    insulin degludec (TRESIBA FLEXTOUCH) 200 UNIT/ML FlexTouch Pen Inject 100 Units into the skin at bedtime.    Insulin Pen Needle (ADVOCATE INSULIN PEN NEEDLES) 33G X 4 MM MISC To use with insulin dosing TID prior to meals and QHS for basal insulin.    omeprazole (PRILOSEC) 20 MG capsule Take 20 mg by mouth daily as needed.    ondansetron (ZOFRAN ODT) 8 MG disintegrating tablet Take 1 tablet (8 mg total) by mouth every 8 (eight) hours as needed for nausea.    ONETOUCH DELICA LANCETS FINE MISC CHECK BLOOD SUGAR THREE TIMES A DAY 11/09/2014: Received from: External Pharmacy   ONETOUCH VERIO test strip CHECK BLOOD SUGAR THREE TIMES A DAY 11/09/2014: Received from: External Pharmacy   Probiotic Product (PROBIOTIC PO) Take 1 tablet by mouth daily in the afternoon.    [DISCONTINUED] fluconazole (DIFLUCAN) 150 MG tablet Take 1 tab po once daily    [DISCONTINUED] metoprolol succinate (TOPROL-XL) 50 MG 24 hr tablet Take 1 tablet (50 mg total) by mouth daily. Take with or immediately following a meal.    [DISCONTINUED] Tdap (BOOSTRIX) 5-2.5-18.5 LF-MCG/0.5 injection Inject 0.5 mLs into the muscle once.    [DISCONTINUED] tiZANidine (ZANAFLEX) 4 MG tablet TAKE 1 TABLET(S) BY MOUTH TWICE A DAY AS NEEDED FOR MUSCLE PAIN AND SPASMS    [DISCONTINUED] traMADol (ULTRAM) 50 MG tablet TAKE  1 TABLET BY MOUTH 3 (THREE) TIMES DAILY AS NEEDED FOR MODERATE PAIN OR SEVERE PAIN.    [DISCONTINUED] Zoster Vaccine Adjuvanted [facility]) injection Inject 0.5 mLs into the muscle once.    metoprolol succinate (TOPROL-XL) 50 MG 24 hr tablet Take 1 tablet (50 mg total) by mouth daily. Take with or immediately following a meal.    [EXPIRED] Tdap (BOOSTRIX) 5-2.5-18.5 LF-MCG/0.5 injection Inject 0.5 mLs into the muscle once for 1 dose.    traMADol (ULTRAM) 50 MG tablet  Take 1 tablet (50 mg total) by mouth 3 (three) times daily as needed for moderate pain or severe pain.    [EXPIRED] Zoster Vaccine Adjuvanted [facility]) injection Inject 0.5 mLs into the muscle once for 1 dose.    No facility-administered encounter medications on file as of 11/08/2020.    Surgical History: Past Surgical History:  Procedure Laterality Date   ABDOMINAL HYSTERECTOMY  1990   BACK SURGERY  2000, 2004   CAROTID ENDARTERECTOMY Left 05/06/12   CARPAL TUNNEL RELEASE Left 07/18/2015   Procedure: CARPAL TUNNEL RELEASE;  Surgeon: Morgan Leys, MD;  Location: [facility] ORS;  Service: Orthopedics;  Laterality: Left;   CEREBRAL ANGIOGRAM Bilateral 05/03/2012   Procedure: CEREBRAL ANGIOGRAM;  Surgeon: Morgan Mould, MD;  Location: [facility] CATH LAB;  Service: Cardiovascular;  Laterality: Bilateral;   CHOLECYSTECTOMY  2001   COLONOSCOPY WITH PROPOFOL N/A 11/19/2018   Procedure: COLONOSCOPY WITH PROPOFOL;  Surgeon: Morgan Lame, MD;  Location: [place];  Service: Endoscopy;  Laterality: N/A;  Diabetic - insulin   ENDARTERECTOMY Left 05/06/2012   Procedure: ENDARTERECTOMY CAROTID;  Surgeon: Morgan Mould, MD;  Location: [place];  Service: Vascular;  Laterality: Left;   ENDARTERECTOMY Right 08/09/2013   Procedure: ENDARTERECTOMY CAROTID-RIGHT;  Surgeon: Morgan Mould, MD;  Location: [place];  Service: Vascular;  Laterality: Right;   HERNIA REPAIR     PATCH ANGIOPLASTY Left 05/06/2012   Procedure: WITH DACRON PATCH ANGIOPLASTY ;  Surgeon: Morgan Mould, MD;  Location: [place];  Service: Vascular;  Laterality: Left;   POLYPECTOMY  11/19/2018   Procedure: POLYPECTOMY;  Surgeon: Morgan Lame, MD;  Location: [place];  Service: Endoscopy;;   SPINE SURGERY  2004   TONSILLECTOMY     TUBAL LIGATION      Medical History: Past Medical History:  Diagnosis Date   Anxiety    Arthritis    Carotid artery occlusion    Chronic kidney disease May 2017   UTI    Depression    Diabetes ([place])    Diverticulosis    Fatty liver    Fibromyalgia    GERD (gastroesophageal reflux disease)    H/O hiatal hernia    Hypertension    IBS (irritable bowel syndrome)    Peptic ulcer    Plantar fasciitis    Stroke [facility]) Dec. 14,2013   Right side-ministroke    Family History: Family History  Problem Relation Age of Onset   Hypertension Mother    Hyperlipidemia Mother    Deep vein thrombosis Mother    Cancer Father    Alcohol abuse Father    Heart failure Father    Hypertension Maternal Grandmother    Deep vein thrombosis Sister    Alcohol abuse Sister    Alcohol abuse Sister     Social History   Socioeconomic History   Marital status: Married    Spouse name: Not on file   Number of children: Not on file   Years of education: Not  on file   Highest education level: Not on file  Occupational History   Not on file  Tobacco Use   Smoking status: Every Day    Packs/day: 1.00    Years: 40.00    Pack years: 40.00    Types: Cigarettes    Start date: 11/09/1970   Smokeless tobacco: Never   Tobacco comments:       Vaping Use   Vaping Use: Former  Substance and Sexual Activity   Alcohol use: Yes    Alcohol/week: 0.0 standard drinks    Comment: rarely   Drug use: No   Sexual activity: Not Currently    Partners: Male  Other Topics Concern   Not on file  Social History Narrative   Not on file   Social Determinants of Health   Financial Resource Strain: Not on file  Food Insecurity: Not on file  Transportation Needs: Not on file  Physical Activity: Not on file  Stress: Not on file  Social Connections: Not on file  Intimate Partner Violence: Not on file      Review of Systems  Constitutional:  Negative for chills, fatigue and unexpected weight change.  HENT:  Negative for congestion, rhinorrhea, sneezing and sore throat.   Eyes:  Negative for redness.  Respiratory:  Negative for cough, chest tightness and shortness of breath.    Cardiovascular:  Negative for chest pain and palpitations.  Gastrointestinal:  Negative for abdominal pain, constipation, diarrhea, nausea and vomiting.  Genitourinary:  Negative for dysuria and frequency.  Musculoskeletal:  Negative for arthralgias, back pain, joint swelling and neck pain.  Skin:  Negative for rash.  Neurological: Negative.  Negative for tremors and numbness.  Hematological:  Negative for adenopathy. Does not bruise/bleed easily.  Psychiatric/Behavioral:  Negative for behavioral problems (Depression), sleep disturbance and suicidal ideas. The patient is not nervous/anxious.    Vital Signs: BP (!) 160/90 Comment: 182/88  Pulse 80   Temp 98.5 F (36.9 C)   Resp 16   Ht 5\' 7"  (1.702 m)   Wt 194 lb 6.4 oz (88.2 kg)   SpO2 97%   BMI 30.45 kg/m    Physical Exam Vitals reviewed.  Constitutional:      General: She is not in acute distress.    Appearance: Normal appearance. She is well-developed. She is obese. She is not ill-appearing or diaphoretic.  HENT:     Head: Normocephalic and atraumatic.  Neck:     Thyroid: No thyromegaly.     Vascular: No JVD.     Trachea: No tracheal deviation.  Cardiovascular:     Rate and Rhythm: Normal rate and regular rhythm.     Pulses: Normal pulses.     Heart sounds: Normal heart sounds. No murmur heard.   No friction rub. No gallop.  Pulmonary:     Effort: Pulmonary effort is normal. No respiratory distress.     Breath sounds: Normal breath sounds. No wheezing or rales.  Chest:     Chest wall: No tenderness.  Skin:    General: Skin is warm and dry.     Capillary Refill: Capillary refill takes less than 2 seconds.  Neurological:     Mental Status: She is alert and oriented to person, place, and time.  Psychiatric:        Mood and Affect: Mood normal.        Behavior: Behavior normal.       Assessment/Plan: 1. Essential hypertension Blood pressure is significantly elevated, she reports  not taking her medication  today and that it usually runs higher when she is in the clinic, she states it is much better at home. Encouraged adherence to medication regimen - metoprolol succinate (TOPROL-XL) 50 MG 24 hr tablet; Take 1 tablet (50 mg total) by mouth daily. Take with or immediately following a meal.  Dispense: 90 tablet; Refill: 1  2. Inflammatory polyarthritis (HCC) Stable, refill ordered - traMADol (ULTRAM) 50 MG tablet; Take 1 tablet (50 mg total) by mouth 3 (three) times daily as needed for moderate pain or severe pain.  Dispense: 90 tablet; Refill: 0  3. Encounter for vaccination - Zoster Vaccine Adjuvanted [facility]) injection; Inject 0.5 mLs into the muscle once for 1 dose.  Dispense: 0.5 mL; Refill: 0 - Tdap (BOOSTRIX) 5-2.5-18.5 LF-MCG/0.5 injection; Inject 0.5 mLs into the muscle once for 1 dose.  Dispense: 0.5 mL; Refill: 0  4. Needs flu shot Administered in office today - Flu Vaccine MDCK QUAD PF   General Counseling: Akiya verbalizes understanding of the findings of todays visit and agrees with plan of treatment. I have discussed any further diagnostic evaluation that may be needed or ordered today. We also reviewed her medications today. she has been encouraged to call the office with any questions or concerns that should arise related to todays visit.    Orders Placed This Encounter  Procedures   Flu Vaccine MDCK QUAD PF    Meds ordered this encounter  Medications   Zoster Vaccine Adjuvanted [facility]) injection    Sig: Inject 0.5 mLs into the muscle once for 1 dose.    Dispense:  0.5 mL    Refill:  0   Tdap (BOOSTRIX) 5-2.5-18.5 LF-MCG/0.5 injection    Sig: Inject 0.5 mLs into the muscle once for 1 dose.    Dispense:  0.5 mL    Refill:  0   traMADol (ULTRAM) 50 MG tablet    Sig: Take 1 tablet (50 mg total) by mouth 3 (three) times daily as needed for moderate pain or severe pain.    Dispense:  90 tablet    Refill:  0    Not to exceed 5 additional fills before 02/24/2021    metoprolol succinate (TOPROL-XL) 50 MG 24 hr tablet    Sig: Take 1 tablet (50 mg total) by mouth daily. Take with or immediately following a meal.    Dispense:  90 tablet    Refill:  1    D/c bisoprolol due to back order    Return in 7 weeks (on 12/25/2020) for previously scheduled, CPE, Charly Holcomb PCP.   Total time spent:20 Minutes Time spent includes review of chart, medications, test results, and follow up plan with the patient.   [place] Controlled Substance Database was reviewed by me.  This patient was seen by Jonetta Osgood, FNP-C in collaboration with Dr. Clayborn Bigness as a part of collaborative care agreement.   Mariselda Badalamenti R. Valetta Fuller, MSN, FNP-C Internal medicine

## 2020-11-13 ENCOUNTER — Other Ambulatory Visit: Payer: Self-pay | Admitting: Nurse Practitioner

## 2020-11-13 DIAGNOSIS — B373 Candidiasis of vulva and vagina: Secondary | ICD-10-CM

## 2020-11-13 DIAGNOSIS — B3731 Acute candidiasis of vulva and vagina: Secondary | ICD-10-CM

## 2020-11-14 ENCOUNTER — Other Ambulatory Visit: Payer: Self-pay

## 2020-11-14 DIAGNOSIS — B373 Candidiasis of vulva and vagina: Secondary | ICD-10-CM

## 2020-11-14 DIAGNOSIS — B3731 Acute candidiasis of vulva and vagina: Secondary | ICD-10-CM

## 2020-11-14 MED ORDER — FLUCONAZOLE 150 MG PO TABS: ORAL_TABLET | ORAL | 0 refills | Status: DC

## 2020-11-18 ENCOUNTER — Other Ambulatory Visit: Payer: Self-pay | Admitting: Nurse Practitioner

## 2020-11-18 DIAGNOSIS — M7540 Impingement syndrome of unspecified shoulder: Secondary | ICD-10-CM

## 2020-11-29 ENCOUNTER — Other Ambulatory Visit: Payer: Self-pay | Admitting: "Endocrinology

## 2020-11-29 DIAGNOSIS — E1142 Type 2 diabetes mellitus with diabetic polyneuropathy: Secondary | ICD-10-CM

## 2020-12-04 ENCOUNTER — Encounter: Payer: Self-pay | Admitting: Nutrition

## 2020-12-04 NOTE — Patient Instructions (Signed)
Goals  Take insulins as prescribed Increase lower carb vegetables Drink a gallon of water per day Get BS less than 180 in am and less than 200 before bed.

## 2020-12-11 ENCOUNTER — Other Ambulatory Visit: Payer: Self-pay | Admitting: Nurse Practitioner

## 2020-12-11 DIAGNOSIS — M064 Inflammatory polyarthropathy: Secondary | ICD-10-CM

## 2020-12-11 NOTE — Telephone Encounter (Signed)
Med sent to pharmacy.

## 2020-12-11 NOTE — Telephone Encounter (Signed)
Last 9/22 aand next 12/25/2020

## 2020-12-21 ENCOUNTER — Telehealth: Payer: Self-pay

## 2020-12-21 ENCOUNTER — Other Ambulatory Visit: Payer: Self-pay | Admitting: Nurse Practitioner

## 2020-12-21 DIAGNOSIS — M7540 Impingement syndrome of unspecified shoulder: Secondary | ICD-10-CM

## 2020-12-21 NOTE — Telephone Encounter (Signed)
Left vm to confirm 12/25/20 appointment-Toni 

## 2020-12-25 ENCOUNTER — Ambulatory Visit (INDEPENDENT_AMBULATORY_CARE_PROVIDER_SITE_OTHER): Payer: Medicare Other

## 2020-12-25 ENCOUNTER — Encounter (INDEPENDENT_AMBULATORY_CARE_PROVIDER_SITE_OTHER): Payer: Self-pay | Admitting: Nurse Practitioner

## 2020-12-25 ENCOUNTER — Other Ambulatory Visit: Payer: Self-pay

## 2020-12-25 ENCOUNTER — Ambulatory Visit (INDEPENDENT_AMBULATORY_CARE_PROVIDER_SITE_OTHER): Payer: Medicare Other | Admitting: Nurse Practitioner

## 2020-12-25 ENCOUNTER — Encounter: Payer: Self-pay | Admitting: Nurse Practitioner

## 2020-12-25 VITALS — BP 186/75 | HR 88 | Ht 67.0 in | Wt 197.0 lb

## 2020-12-25 VITALS — BP 130/64 | HR 67 | Temp 98.2°F | Resp 16 | Ht 67.0 in | Wt 196.8 lb

## 2020-12-25 DIAGNOSIS — Z794 Long term (current) use of insulin: Secondary | ICD-10-CM | POA: Diagnosis not present

## 2020-12-25 DIAGNOSIS — K580 Irritable bowel syndrome with diarrhea: Secondary | ICD-10-CM | POA: Diagnosis not present

## 2020-12-25 DIAGNOSIS — E1140 Type 2 diabetes mellitus with diabetic neuropathy, unspecified: Secondary | ICD-10-CM

## 2020-12-25 DIAGNOSIS — Z0001 Encounter for general adult medical examination with abnormal findings: Secondary | ICD-10-CM

## 2020-12-25 DIAGNOSIS — I10 Essential (primary) hypertension: Secondary | ICD-10-CM | POA: Diagnosis not present

## 2020-12-25 DIAGNOSIS — I6523 Occlusion and stenosis of bilateral carotid arteries: Secondary | ICD-10-CM

## 2020-12-25 DIAGNOSIS — I739 Peripheral vascular disease, unspecified: Secondary | ICD-10-CM

## 2020-12-25 DIAGNOSIS — F17200 Nicotine dependence, unspecified, uncomplicated: Secondary | ICD-10-CM

## 2020-12-25 DIAGNOSIS — E7800 Pure hypercholesterolemia, unspecified: Secondary | ICD-10-CM | POA: Diagnosis not present

## 2020-12-25 DIAGNOSIS — M064 Inflammatory polyarthropathy: Secondary | ICD-10-CM

## 2020-12-25 DIAGNOSIS — R300 Dysuria: Secondary | ICD-10-CM | POA: Diagnosis not present

## 2020-12-25 DIAGNOSIS — Z23 Encounter for immunization: Secondary | ICD-10-CM | POA: Diagnosis not present

## 2020-12-25 MED ORDER — DICYCLOMINE HCL 10 MG PO CAPS: 10.0000 mg | ORAL_CAPSULE | Freq: Three times a day (TID) | ORAL | 2 refills | Status: DC

## 2020-12-25 MED ORDER — ZOSTER VAC RECOMB ADJUVANTED 50 MCG/0.5ML IM SUSR: 0.5000 mL | Freq: Once | INTRAMUSCULAR | 0 refills | Status: AC

## 2020-12-25 NOTE — Progress Notes (Signed)
[facility] [place], [place] [address]  Internal MEDICINE  Office Visit Note  Patient Name: Morgan Daniels  [id number]  [id number]  Date of Service: 12/25/2020  Chief Complaint  Patient presents with   Medicare Wellness    refills   Depression   Diabetes   Gastroesophageal Reflux   Hypertension   Anxiety    HPI Morgan Daniels presents for an annual well visit and physical exam. She is a well-appearing 60 yo female. She is in need of medication refills. She is in need of her shingles vaccine Her mammogram is scheduled for tomorrow. She had her diabetic eye exam in July this year.  She has diabetes and is followed by endocrinology. She had her colonoscopy in 2020, repeat in 2025. She has an appointment with [facility] Dr. Lucky Cowboy today.  She has GERD, increased gas, used to have it really bad and went to see GI doc. She has been using Gas-X, rolaids, and peptobismol. She has a history of peptic ulcer.  She has tried famotidine and omeprazole and pantoprazole. She has started using a probiotic. She has chronic diarrhea and IBS. She has not tried dicyclomine.  Her has hypertension but her blood pressure is controlled at this time.       Current Medication: Outpatient Encounter Medications as of 12/25/2020  Medication Sig Note   Cholecalciferol (VITAMIN D-3) 5000 UNITS TABS Take 5,000 Units by mouth daily.     Continuous Blood Gluc Receiver (FREESTYLE LIBRE 2 READER) DEVI As directed    Continuous Blood Gluc Sensor (FREESTYLE LIBRE 2 SENSOR) MISC USE TO TEST BLOOD GLUCOSE 4 TIMES A DAY . DX E11.65    dicyclomine (BENTYL) 10 MG capsule Take 1 capsule (10 mg total) by mouth 3 (three) times daily before meals.    diphenoxylate-atropine (LOMOTIL) 2.5-0.025 MG tablet Take 1 tablet by mouth 3 (three) times daily as needed for diarrhea or loose stools.    fluconazole (DIFLUCAN) 150 MG tablet Take 1 tab po once daily (Patient not taking: Reported on 01/03/2021)    FLUoxetine (PROZAC)  40 MG capsule Take 2 capsules (80 mg total) by mouth daily.    gabapentin (NEURONTIN) 600 MG tablet TAKE 1 TABLET BY MOUTH THREE TIMES A DAY.    gemfibrozil (LOPID) 600 MG tablet Take 1 tablet (600 mg total) by mouth 2 (two) times daily before a meal.    hydrochlorothiazide (HYDRODIURIL) 25 MG tablet TAKE 1 TABLET BY MOUTH DAILY FOR BLOOD PRESSURE/EDEMA    insulin aspart (NOVOLOG FLEXPEN) 100 UNIT/ML FlexPen Inject 20-26 Units into the skin 3 (three) times daily before meals.    insulin degludec (TRESIBA FLEXTOUCH) 200 UNIT/ML FlexTouch Pen Inject 100 Units into the skin at bedtime.    Insulin Pen Needle (ADVOCATE INSULIN PEN NEEDLES) 33G X 4 MM MISC To use with insulin dosing TID prior to meals and QHS for basal insulin.    metoprolol succinate (TOPROL-XL) 50 MG 24 hr tablet Take 1 tablet (50 mg total) by mouth daily. Take with or immediately following a meal.    omeprazole (PRILOSEC) 20 MG capsule Take 20 mg by mouth daily as needed.    ondansetron (ZOFRAN ODT) 8 MG disintegrating tablet Take 1 tablet (8 mg total) by mouth every 8 (eight) hours as needed for nausea. (Patient not taking: Reported on 78/58/8502)    ONETOUCH DELICA LANCETS FINE MISC CHECK BLOOD SUGAR THREE TIMES A DAY 11/09/2014: Received from: [facility] test strip CHECK BLOOD SUGAR  THREE TIMES A DAY 11/09/2014: Received from: External Pharmacy   Probiotic Product (PROBIOTIC PO) Take 1 tablet by mouth daily in the afternoon.    traMADol (ULTRAM) 50 MG tablet TAKE 1 TABLET BY MOUTH 3 TIMES DAILY AS NEEDED FOR MODERATE PAIN OR SEVERE PAIN.    [EXPIRED] Zoster Vaccine Adjuvanted [facility]) injection Inject 0.5 mLs into the muscle once for 1 dose.    [DISCONTINUED] tiZANidine (ZANAFLEX) 4 MG tablet TAKE 1 TABLET(S) BY MOUTH TWICE A DAY AS NEEDED FOR MUSCLE PAIN AND SPASMS    [DISCONTINUED] Continuous Blood Gluc Sensor (FREESTYLE LIBRE 2 SENSOR) MISC Use to test BG qid. Dx E11.65    [DISCONTINUED] fluconazole  (DIFLUCAN) 150 MG tablet Take 1 tab po once daily    [DISCONTINUED] insulin aspart (NOVOLOG FLEXPEN) 100 UNIT/ML FlexPen Inject 20-26 Units into the skin 3 (three) times daily before meals. (Patient taking differently: Inject 25 Units into the skin 3 (three) times daily before meals.)    [DISCONTINUED] insulin degludec (TRESIBA FLEXTOUCH) 200 UNIT/ML FlexTouch Pen Inject 80 Units into the skin at bedtime. (Patient taking differently: Inject 90 Units into the skin at bedtime.)    [DISCONTINUED] Insulin Pen Needle (ADVOCATE INSULIN PEN NEEDLES) 33G X 4 MM MISC To use with insulin dosing TID prior to meals and QHS for basal insulin.    [DISCONTINUED] metoprolol succinate (TOPROL-XL) 50 MG 24 hr tablet Take 1 tablet (50 mg total) by mouth daily. Take with or immediately following a meal.    [DISCONTINUED] Tdap (BOOSTRIX) 5-2.5-18.5 LF-MCG/0.5 injection Inject 0.5 mLs into the muscle once.    [DISCONTINUED] tiZANidine (ZANAFLEX) 4 MG tablet TAKE 1 TABLET(S) BY MOUTH TWICE A DAY AS NEEDED FOR MUSCLE PAIN AND SPASMS    [DISCONTINUED] traMADol (ULTRAM) 50 MG tablet Take 1 tablet (50 mg total) by mouth 3 (three) times daily as needed for moderate pain or severe pain.    No facility-administered encounter medications on file as of 12/25/2020.    Surgical History: Past Surgical History:  Procedure Laterality Date   ABDOMINAL HYSTERECTOMY  1990   BACK SURGERY  2000, 2004   CAROTID ENDARTERECTOMY Left 05/06/12   CARPAL TUNNEL RELEASE Left 07/18/2015   Procedure: CARPAL TUNNEL RELEASE;  Surgeon: Earnestine Leys, MD;  Location: [facility];  Service: Orthopedics;  Laterality: Left;   CEREBRAL ANGIOGRAM Bilateral 05/03/2012   Procedure: CEREBRAL ANGIOGRAM;  Surgeon: Angelia Mould, MD;  Location: [facility] CATH LAB;  Service: Cardiovascular;  Laterality: Bilateral;   CHOLECYSTECTOMY  2001   COLONOSCOPY WITH PROPOFOL N/A 11/19/2018   Procedure: COLONOSCOPY WITH PROPOFOL;  Surgeon: Lucilla Lame, MD;  Location: [place];  Service: Endoscopy;  Laterality: N/A;  Diabetic - insulin   ENDARTERECTOMY Left 05/06/2012   Procedure: ENDARTERECTOMY CAROTID;  Surgeon: Angelia Mould, MD;  Location: [place];  Service: Vascular;  Laterality: Left;   ENDARTERECTOMY Right 08/09/2013   Procedure: ENDARTERECTOMY CAROTID-RIGHT;  Surgeon: Angelia Mould, MD;  Location: [place];  Service: Vascular;  Laterality: Right;   HERNIA REPAIR     LOWER EXTREMITY ANGIOGRAPHY Left 01/03/2021   Procedure: LOWER EXTREMITY ANGIOGRAPHY;  Surgeon: Algernon Huxley, MD;  Location: [place] CV LAB;  Service: Cardiovascular;  Laterality: Left;   PATCH ANGIOPLASTY Left 05/06/2012   Procedure: WITH DACRON PATCH ANGIOPLASTY ;  Surgeon: Angelia Mould, MD;  Location: [place];  Service: Vascular;  Laterality: Left;   POLYPECTOMY  11/19/2018   Procedure: POLYPECTOMY;  Surgeon: Lucilla Lame, MD;  Location: [place];  Service: Endoscopy;;   SPINE SURGERY  2004   TONSILLECTOMY     TUBAL LIGATION      Medical History: Past Medical History:  Diagnosis Date   Anxiety    Arthritis    Carotid artery occlusion    Chronic kidney disease May 2017   UTI   Depression    Diabetes ([place])    Diverticulosis    Fatty liver    Fibromyalgia    GERD (gastroesophageal reflux disease)    H/O hiatal hernia    Hypertension    IBS (irritable bowel syndrome)    Peptic ulcer    Plantar fasciitis    Stroke [facility]) Dec. 14,2013   Right side-ministroke    Family History: Family History  Problem Relation Age of Onset   Hypertension Mother    Hyperlipidemia Mother    Deep vein thrombosis Mother    Cancer Father    Alcohol abuse Father    Heart failure Father    Hypertension Maternal Grandmother    Deep vein thrombosis Sister    Alcohol abuse Sister    Alcohol abuse Sister     Social History   Socioeconomic History   Marital status: Married    Spouse name: Not on file   Number of children: Not on file   Years of  education: Not on file   Highest education level: Not on file  Occupational History   Not on file  Tobacco Use   Smoking status: Every Day    Packs/day: 1.00    Years: 40.00    Pack years: 40.00    Types: Cigarettes    Start date: 11/09/1970   Smokeless tobacco: Never   Tobacco comments:       Vaping Use   Vaping Use: Former  Substance and Sexual Activity   Alcohol use: Yes    Alcohol/week: 0.0 standard drinks    Comment: rarely   Drug use: No   Sexual activity: Not Currently    Partners: Male  Other Topics Concern   Not on file  Social History Narrative   Not on file   Social Determinants of Health   Financial Resource Strain: Not on file  Food Insecurity: Not on file  Transportation Needs: Not on file  Physical Activity: Not on file  Stress: Not on file  Social Connections: Not on file  Intimate Partner Violence: Not on file      Review of Systems  Constitutional:  Negative for activity change, appetite change, chills, fatigue, fever and unexpected weight change.  HENT: Negative.  Negative for congestion, ear pain, rhinorrhea, sore throat and trouble swallowing.   Eyes: Negative.   Respiratory: Negative.  Negative for cough, chest tightness, shortness of breath and wheezing.   Cardiovascular: Negative.  Negative for chest pain and palpitations.  Gastrointestinal:  Positive for abdominal distention (bloating, heartburn), abdominal pain, constipation and diarrhea. Negative for blood in stool, nausea and vomiting.  Endocrine: Negative.   Genitourinary: Negative.  Negative for difficulty urinating, dysuria, frequency, hematuria and urgency.  Musculoskeletal: Negative.  Negative for arthralgias, back pain, joint swelling, myalgias and neck pain.  Skin: Negative.  Negative for rash and wound.  Allergic/Immunologic: Negative.  Negative for immunocompromised state.  Neurological: Negative.  Negative for dizziness, seizures, numbness and headaches.  Hematological:  Negative.   Psychiatric/Behavioral:  Negative for behavioral problems, self-injury and suicidal ideas. The patient is not nervous/anxious.    Vital Signs: BP 130/64 Comment: 166/84  Pulse 67   Temp 98.2 F (  36.8 C)   Resp 16   Ht 5\' 7"  (1.702 m)   Wt 196 lb 12.8 oz (89.3 kg)   SpO2 98%   BMI 30.82 kg/m    Physical Exam Vitals reviewed.  Constitutional:      General: She is awake. She is not in acute distress.    Appearance: Normal appearance. She is well-developed and well-groomed. She is obese. She is not diaphoretic.  HENT:     Head: Normocephalic and atraumatic.     Right Ear: Tympanic membrane, ear canal and external ear normal.     Left Ear: Tympanic membrane, ear canal and external ear normal.     Nose: Nose normal. No congestion or rhinorrhea.     Mouth/Throat:     Pharynx: No oropharyngeal exudate or posterior oropharyngeal erythema.  Eyes:     General: Lids are normal. Vision grossly intact. Gaze aligned appropriately. No scleral icterus.       Right eye: No discharge.        Left eye: No discharge.     Extraocular Movements: Extraocular movements intact.     Conjunctiva/sclera: Conjunctivae normal.     Pupils: Pupils are equal, round, and reactive to light.     Funduscopic exam:    Right eye: Red reflex present.        Left eye: Red reflex present. Neck:     Thyroid: No thyromegaly.     Vascular: No JVD.     Trachea: Trachea and phonation normal. No tracheal deviation.  Cardiovascular:     Rate and Rhythm: Normal rate and regular rhythm.     Pulses: Normal pulses.          Dorsalis pedis pulses are 2+ on the right side and 2+ on the left side.       Posterior tibial pulses are 2+ on the right side and 2+ on the left side.     Heart sounds: Normal heart sounds, S1 normal and S2 normal. No murmur heard.   No friction rub. No gallop.  Pulmonary:     Effort: Pulmonary effort is normal. No accessory muscle usage or respiratory distress.     Breath sounds:  Normal breath sounds and air entry. No stridor. No wheezing or rales.  Chest:     Chest wall: No tenderness.     Comments: Declined clinical breast exam, gets annual mammograms.  Abdominal:     General: Bowel sounds are normal. There is distension.     Palpations: Abdomen is soft. There is no shifting dullness, fluid wave, mass or pulsatile mass.     Tenderness: There is generalized abdominal tenderness (related to IBS, comes and goes.). There is no guarding or rebound.  Musculoskeletal:        General: No tenderness or deformity. Normal range of motion.     Cervical back: Normal range of motion and neck supple.     Right foot: Normal range of motion.  Feet:     Right foot:     Protective Sensation: 6 sites tested.  6 sites sensed.     Skin integrity: Callus and dry skin present.     Toenail Condition: Right toenails are abnormally thick.     Left foot:     Protective Sensation: 6 sites tested.  6 sites sensed.     Skin integrity: Callus and dry skin present.     Toenail Condition: Left toenails are abnormally thick.  Lymphadenopathy:     Cervical: No cervical adenopathy.  Skin:    General: Skin is warm and dry.     Capillary Refill: Capillary refill takes less than 2 seconds.     Coloration: Skin is not pale.     Findings: No erythema or rash.  Neurological:     Mental Status: She is alert and oriented to person, place, and time.     Cranial Nerves: No cranial nerve deficit.     Motor: No abnormal muscle tone.     Coordination: Coordination normal.     Deep Tendon Reflexes: Reflexes are normal and symmetric.  Psychiatric:        Mood and Affect: Mood normal.        Behavior: Behavior normal. Behavior is cooperative.        Thought Content: Thought content normal.        Judgment: Judgment normal.       Assessment/Plan: 1. Encounter for general adult medical examination with abnormal findings Age-appropriate preventive screenings and vaccinations discussed, annual  physical exam completed. Routine labs for health maintenance previously ordered in September. Patient reminded to go get labs drawn and will call patient with results. PHM updated.   2. Essential hypertension Stable with current medicatiosn, continue as prescribed.   3. Irritable bowel syndrome with diarrhea Will try dicyclomine for abdominal cramping related to IBS. Consider GI referral if no improvement. - dicyclomine (BENTYL) 10 MG capsule; Take 1 capsule (10 mg total) by mouth 3 (three) times daily before meals.  Dispense: 90 capsule; Refill: 2  4. 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   5. Type 2 diabetes mellitus with diabetic neuropathy, with long-term current use of insulin ([place]) Due for urine microalbumin. Diabetes is managed by endocrinology. Diabetic foot exam was done today see PE.  - Microalbumin, urine  6. Dysuria Routine urinalysis done. - UA/M w/rflx Culture, Routine - Microscopic Examination - Urine Culture, Reflex  7. Need for vaccination - Zoster Vaccine Adjuvanted [facility]) injection; Inject 0.5 mLs into the muscle once for 1 dose.  Dispense: 0.5 mL; Refill: 0      General Counseling: Adler verbalizes understanding of the findings of todays visit and agrees with plan of treatment. I have discussed any further diagnostic evaluation that may be needed or ordered today. We also reviewed her medications today. she has been encouraged to call the office with any questions or concerns that should arise related to todays visit.    Orders Placed This Encounter  Procedures   Microscopic Examination   Urine Culture, Reflex   Microalbumin, urine   UA/M w/rflx Culture, Routine    Meds ordered this encounter  Medications   dicyclomine (BENTYL) 10 MG capsule    Sig: Take 1 capsule (10 mg total) by mouth 3 (three) times daily before meals.    Dispense:  90 capsule    Refill:  2   Zoster Vaccine Adjuvanted [facility])  injection    Sig: Inject 0.5 mLs into the muscle once for 1 dose.    Dispense:  0.5 mL    Refill:  0    Return in about 6 months (around 06/24/2021) for F/U, med refill, Sajad Glander PCP.   Total time spent:30 Minutes Time spent includes review of chart, medications, test results, and follow up plan with the patient.   [place] Controlled Substance Database was reviewed by me.  This patient was seen by Jonetta Osgood, FNP-C in collaboration with Dr. Clayborn Bigness as a part of collaborative care agreement.  Lino Wickliff R. Valetta Fuller, MSN,  FNP-C Internal medicine

## 2020-12-26 ENCOUNTER — Ambulatory Visit
Admission: RE | Admit: 2020-12-26 | Discharge: 2020-12-26 | Disposition: A | Discharge status: Home / Self Care | Payer: Medicare Other | Source: Ambulatory Visit | Attending: Nurse Practitioner | Admitting: Nurse Practitioner

## 2020-12-26 ENCOUNTER — Telehealth (INDEPENDENT_AMBULATORY_CARE_PROVIDER_SITE_OTHER): Payer: Self-pay

## 2020-12-26 DIAGNOSIS — Z1231 Encounter for screening mammogram for malignant neoplasm of breast: Secondary | ICD-10-CM | POA: Insufficient documentation

## 2020-12-26 NOTE — Telephone Encounter (Signed)
Patient returned the call and is scheduled with Dr. Lucky Cowboy for a LLE angio on 01/03/21 with a 6:45 am arrival time to the [facility]. Pre-procedure instructions were discussed and will be mailed.

## 2020-12-26 NOTE — Telephone Encounter (Signed)
I attempted to contact the patient to schedule a LLE angio with Dr. Lucky Cowboy and a message was left for return call.

## 2020-12-27 ENCOUNTER — Telehealth: Payer: Self-pay

## 2020-12-27 DIAGNOSIS — N3000 Acute cystitis without hematuria: Secondary | ICD-10-CM

## 2020-12-28 MED ORDER — CIPROFLOXACIN HCL 250 MG PO TABS: 250.0000 mg | ORAL_TABLET | Freq: Two times a day (BID) | ORAL | 0 refills | Status: AC

## 2020-12-28 NOTE — Telephone Encounter (Signed)
Patient having sx of UTI, urinalysis is positive for leukocytes and blood. Culture has not resulted yet.

## 2020-12-28 NOTE — Telephone Encounter (Signed)
LMOM advising that we sent medication (ABX) to her pharmacy for UTI and to call back if any questions

## 2021-01-01 LAB — UA/M W/RFLX CULTURE, ROUTINE
Bilirubin, UA: NEGATIVE
Glucose, UA: NEGATIVE
Ketones, UA: NEGATIVE
Nitrite, UA: NEGATIVE
RBC, UA: NEGATIVE
Specific Gravity, UA: 1.015 (ref 1.005–1.030)
Urobilinogen, Ur: 0.2 mg/dL (ref 0.2–1.0)
pH, UA: 6 (ref 5.0–7.5)

## 2021-01-01 LAB — MICROSCOPIC EXAMINATION
Casts: NONE SEEN /lpf
WBC, UA: >30 /hpf — AB (ref 0–5)

## 2021-01-01 LAB — MICROALBUMIN, URINE: Microalbumin, Urine: 823.1 ug/mL

## 2021-01-01 LAB — URINE CULTURE, REFLEX

## 2021-01-02 ENCOUNTER — Other Ambulatory Visit (INDEPENDENT_AMBULATORY_CARE_PROVIDER_SITE_OTHER): Payer: Medicare Other | Admitting: Nurse Practitioner

## 2021-01-02 DIAGNOSIS — I6523 Occlusion and stenosis of bilateral carotid arteries: Secondary | ICD-10-CM

## 2021-01-02 DIAGNOSIS — F17200 Nicotine dependence, unspecified, uncomplicated: Secondary | ICD-10-CM

## 2021-01-02 DIAGNOSIS — E7800 Pure hypercholesterolemia, unspecified: Secondary | ICD-10-CM

## 2021-01-02 DIAGNOSIS — I739 Peripheral vascular disease, unspecified: Secondary | ICD-10-CM

## 2021-01-02 NOTE — H&P (View-Only) (Signed)
Subjective:    Patient ID: Morgan Daniels, female    DOB: 20-Apr-1960, 60 y.o.   MRN: [id number] No chief complaint on file.   Morgan Daniels is a 60 year old female that presents today for follow-up of her peripheral arterial disease.  The patient was previously seen and noted to have abnormal ABIs.  The patient continues to deny classic claudication-like symptoms but what is more significant is weakness in her lower extremities.  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  She denies any rest pain like symptoms.  She has no wounds or ulcerations.  She also follows up for noted carotid stenosis that has not been evaluated in some time.  She previously had a carotid endarterectomy in 2014.  She denies any TIA or CVA-like symptoms.  No amaurosis fugax.  Today the patient's right ABI is 1.06 with an ABI of 0.68 on the left.  The patient has monophasic tibial artery waveforms bilaterally.  She has normal toe waveforms on the right with dampened on the left.  Carotid show a 1 to 39% stenosis bilaterally.    Review of Systems     Objective:   Physical Exam  There were no vitals taken for this visit.  Past Medical History:  Diagnosis Date   Anxiety    Arthritis    Carotid artery occlusion    Chronic kidney disease May 2017   UTI   Depression    Diabetes ([place])    Diverticulosis    Fatty liver    Fibromyalgia    GERD (gastroesophageal reflux disease)    H/O hiatal hernia    Hypertension    IBS (irritable bowel syndrome)    Peptic ulcer    Plantar fasciitis    Stroke [facility]) Dec. 14,2013   Right side-ministroke    Social History   Socioeconomic History   Marital status: Married    Spouse name: Not on file   Number of children: Not on file   Years of education: Not on file   Highest education level: Not on file  Occupational History   Not on  file  Tobacco Use   Smoking status: Every Day    Packs/day: 1.00    Years: 40.00    Pack years: 40.00    Types: Cigarettes    Start date: 11/09/1970   Smokeless tobacco: Never   Tobacco comments:       Vaping Use   Vaping Use: Former  Substance and Sexual Activity   Alcohol use: Yes    Alcohol/week: 0.0 standard drinks    Comment: rarely   Drug use: No   Sexual activity: Not Currently    Partners: Male  Other Topics Concern   Not on file  Social History Narrative   Not on file   Social Determinants of Health   Financial Resource Strain: Not on file  Food Insecurity: Not on file  Transportation Needs: Not on file  Physical Activity: Not on file  Stress: Not on file  Social Connections: Not on file  Intimate Partner Violence: Not on file    Past Surgical History:  Procedure Laterality Date   [place]  2000, 2004   CAROTID ENDARTERECTOMY Left 05/06/12   CARPAL TUNNEL RELEASE Left 07/18/2015   Procedure: CARPAL TUNNEL RELEASE;  Surgeon: Earnestine Leys, MD;  Location:  [facility] ORS;  Service: Orthopedics;  Laterality: Left;   CEREBRAL ANGIOGRAM Bilateral 05/03/2012   Procedure: CEREBRAL ANGIOGRAM;  Surgeon: Angelia Mould, MD;  Location: [facility] CATH LAB;  Service: Cardiovascular;  Laterality: Bilateral;   CHOLECYSTECTOMY  2001   COLONOSCOPY WITH PROPOFOL N/A 11/19/2018   Procedure: COLONOSCOPY WITH PROPOFOL;  Surgeon: Lucilla Lame, MD;  Location: [place];  Service: Endoscopy;  Laterality: N/A;  Diabetic - insulin   ENDARTERECTOMY Left 05/06/2012   Procedure: ENDARTERECTOMY CAROTID;  Surgeon: Angelia Mould, MD;  Location: [place];  Service: Vascular;  Laterality: Left;   ENDARTERECTOMY Right 08/09/2013   Procedure: ENDARTERECTOMY CAROTID-RIGHT;  Surgeon: Angelia Mould, MD;  Location: [place];  Service: Vascular;  Laterality: Right;   HERNIA REPAIR     PATCH ANGIOPLASTY Left 05/06/2012   Procedure: WITH DACRON PATCH  ANGIOPLASTY ;  Surgeon: Angelia Mould, MD;  Location: [place];  Service: Vascular;  Laterality: Left;   POLYPECTOMY  11/19/2018   Procedure: POLYPECTOMY;  Surgeon: Lucilla Lame, MD;  Location: [place];  Service: Endoscopy;;   SPINE SURGERY  2004   TONSILLECTOMY     TUBAL LIGATION      Family History  Problem Relation Age of Onset   Hypertension Mother    Hyperlipidemia Mother    Deep vein thrombosis Mother    Cancer Father    Alcohol abuse Father    Heart failure Father    Hypertension Maternal Grandmother    Deep vein thrombosis Sister    Alcohol abuse Sister    Alcohol abuse Sister     Allergies  Allergen Reactions   Simvastatin Diarrhea and Nausea And Vomiting   Morphine And Related Itching   Milk-Related Compounds Diarrhea    bloating   Latex Rash    Rash when she wears gloves (Negative by test - per pt)    CBC Latest Ref Rng & Units 05/14/2020 02/03/2019 02/03/2019  WBC 4.0 - 10.5 K/uL 7.4 - 9.5  Hemoglobin 12.0 - 15.0 g/dL 15.0 16.3(H) 15.9(H)  Hematocrit 36.0 - 46.0 % 43.3 48.0(H) 46.0  Platelets 150 - 400 K/uL 276 - 299      CMP     Component Value Date/Time   NA 133 (L) 05/14/2020 1025   NA 138 07/30/2013 0436   K 4.1 05/14/2020 1025   K 4.0 07/30/2013 0436   CL 97 (L) 05/14/2020 1025   CL 103 07/30/2013 0436   CO2 26 05/14/2020 1025   CO2 26 07/30/2013 0436   GLUCOSE 281 (H) 05/14/2020 1025   GLUCOSE 109 (H) 07/30/2013 0436   BUN 15 05/14/2020 1025   BUN 15 07/30/2013 0436   CREATININE 0.60 05/14/2020 1025   CREATININE 0.67 07/30/2013 0436   CALCIUM 9.5 05/14/2020 1025   CALCIUM 8.8 07/30/2013 0436   PROT 7.9 05/14/2020 1025   PROT 7.8 07/29/2013 0650   ALBUMIN 4.0 05/14/2020 1025   ALBUMIN 3.5 07/29/2013 0650   AST 22 05/14/2020 1025   AST 25 07/29/2013 0650   ALT 20 05/14/2020 1025   ALT 38 07/29/2013 0650   ALKPHOS 66 05/14/2020 1025   ALKPHOS 83 07/29/2013 0650   BILITOT 0.8 05/14/2020 1025   BILITOT 0.2 07/29/2013  0650   GFRNONAA >60 05/14/2020 1025   GFRNONAA >60 07/30/2013 0436   GFRAA >60 02/03/2019 1126   GFRAA >60 07/30/2013 0436     VAS [place] ABI WITH/WO TBI  Result Date: 12/28/2020  LOWER EXTREMITY DOPPLER STUDY Patient Name:  Morgan Daniels  Morgan Daniels Found  Date of Exam:   12/25/2020 Medical Rec #: [id number]       Accession #:    [id number] Date of Birth: 05-08-1960       Patient Gender: F Patient Age:   14 years Exam Location:  [facility] Procedure:      VAS [place] ABI WITH/WO TBI Referring Phys: Eulogio Ditch --------------------------------------------------------------------------------  Indications: Peripheral artery disease.  Comparison Study: 09/21/2020 Performing Technologist: Almira Coaster RVS  Examination Guidelines: A complete evaluation includes at minimum, Doppler waveform signals and systolic blood pressure reading at the level of bilateral brachial, anterior tibial, and posterior tibial arteries, when vessel segments are accessible. Bilateral testing is considered an integral part of a complete examination. Photoelectric Plethysmograph (PPG) waveforms and toe systolic pressure readings are included as required and additional duplex testing as needed. Limited examinations for reoccurring indications may be performed as noted.  ABI Findings: +---------+------------------+-----+----------+--------+ Right    Rt Pressure (mmHg)IndexWaveform  Comment  +---------+------------------+-----+----------+--------+ Brachial 150                                       +---------+------------------+-----+----------+--------+ ATA      169                    monophasic1.06     +---------+------------------+-----+----------+--------+ PTA      116               0.72 monophasic         +---------+------------------+-----+----------+--------+ Great Toe163               1.02 Normal             +---------+------------------+-----+----------+--------+  +---------+------------------+-----+----------+-------+ Left     Lt Pressure (mmHg)IndexWaveform  Comment +---------+------------------+-----+----------+-------+ Brachial 160                                      +---------+------------------+-----+----------+-------+ ATA      108                    monophasic.68     +---------+------------------+-----+----------+-------+ PTA      99                0.62 monophasic        +---------+------------------+-----+----------+-------+ Great Toe107               0.67 Abnormal          +---------+------------------+-----+----------+-------+ +-------+-----------+-----------+------------+------------+ ABI/TBIToday's ABIToday's TBIPrevious ABIPrevious TBI +-------+-----------+-----------+------------+------------+ Right  1.06       1.02       1.06        .74          +-------+-----------+-----------+------------+------------+ Left   .68        .67        .65         .37          +-------+-----------+-----------+------------+------------+ Bilateral ABIs appear essentially unchanged compared to prior study on 09/21/2020. Bilateral TBIs appear increased compared to prior study on 09/21/2020.  Summary: Right: Resting right ankle-brachial index is within normal range. No evidence of significant right lower extremity arterial disease. The right toe-brachial index is normal. ABI's suggest Normal Flow but unreliable due to calcified plaque in the Arteries; Imaging displayed Monophasic Waveforms. Left: Resting left ankle-brachial  index indicates moderate left lower extremity arterial disease. The left toe-brachial index is abnormal.  *See table(s) above for measurements and observations.  Electronically signed by Leotis Pain MD on 12/28/2020 at 9:48:19 AM.    Final        Assessment & Plan:    1. PAD (peripheral artery disease) (HCC) Recommend:  The patient has experienced increased symptoms and is now describing lifestyle limiting  claudication and mild rest pain.   Given the severity of the patient's lower extremity symptoms the patient should undergo angiography and intervention.  Risk and benefits were reviewed the patient.  Indications for the procedure were reviewed.  All questions were answered, the patient agrees to proceed.   The patient should continue walking and begin a more formal exercise program.  The patient should continue antiplatelet therapy and aggressive treatment of the lipid abnormalities  The patient will follow up with me after the angiogram.   - BUN; Standing - Creatinine, serum; Standing  2. TOBACCO ABUSE Smoking cessation was discussed, 3-10 minutes spent on this topic specifically   3. HYPERCHOLESTEROLEMIA Continue statin as ordered and reviewed, no changes at this time     4. Bilateral carotid artery stenosis Recommend:  Given the patient's asymptomatic subcritical stenosis no further invasive testing or surgery at this time.  Duplex ultrasound shows 1-39% stenosis bilaterally.  Continue antiplatelet therapy as prescribed Continue management of CAD, HTN and Hyperlipidemia Healthy heart diet,  encouraged exercise at least 4 times per week Follow up in 12 months with duplex ultrasound and physical exam     Current Outpatient Medications on File Prior to Visit  Medication Sig Dispense Refill   Cholecalciferol (VITAMIN D-3) 5000 UNITS TABS Take 5,000 Units by mouth daily.      Continuous Blood Gluc Receiver (FREESTYLE LIBRE 2 READER) DEVI As directed 1 each 0   Continuous Blood Gluc Sensor (FREESTYLE LIBRE 2 SENSOR) MISC USE TO TEST BLOOD GLUCOSE 4 TIMES A DAY . DX E11.65 2 each 3   dicyclomine (BENTYL) 10 MG capsule Take 1 capsule (10 mg total) by mouth 3 (three) times daily before meals. 90 capsule 2   diphenoxylate-atropine (LOMOTIL) 2.5-0.025 MG tablet Take 1 tablet by mouth 3 (three) times daily as needed for diarrhea or loose stools. 30 tablet 0   fluconazole (DIFLUCAN) 150  MG tablet Take 1 tab po once daily 3 tablet 0   FLUoxetine (PROZAC) 40 MG capsule Take 2 capsules (80 mg total) by mouth daily. 180 capsule 1   gabapentin (NEURONTIN) 600 MG tablet TAKE 1 TABLET BY MOUTH THREE TIMES A DAY. 270 tablet 2   gemfibrozil (LOPID) 600 MG tablet Take 1 tablet (600 mg total) by mouth 2 (two) times daily before a meal. 180 tablet 1   hydrochlorothiazide (HYDRODIURIL) 25 MG tablet TAKE 1 TABLET BY MOUTH DAILY FOR BLOOD PRESSURE/EDEMA 90 tablet 1   insulin aspart (NOVOLOG FLEXPEN) 100 UNIT/ML FlexPen Inject 20-26 Units into the skin 3 (three) times daily before meals. 30 mL 2   insulin degludec (TRESIBA FLEXTOUCH) 200 UNIT/ML FlexTouch Pen Inject 100 Units into the skin at bedtime. 30 mL 2   Insulin Pen Needle (ADVOCATE INSULIN PEN NEEDLES) 33G X 4 MM MISC To use with insulin dosing TID prior to meals and QHS for basal insulin. 100 each 2   metoprolol succinate (TOPROL-XL) 50 MG 24 hr tablet Take 1 tablet (50 mg total) by mouth daily. Take with or immediately following a meal. 90 tablet 1   omeprazole (  PRILOSEC) 20 MG capsule Take 20 mg by mouth daily as needed.     ondansetron (ZOFRAN ODT) 8 MG disintegrating tablet Take 1 tablet (8 mg total) by mouth every 8 (eight) hours as needed for nausea. 30 tablet 2   ONETOUCH DELICA LANCETS FINE MISC CHECK BLOOD SUGAR THREE TIMES A DAY  5   ONETOUCH VERIO test strip CHECK BLOOD SUGAR THREE TIMES A DAY  5   Probiotic Product (PROBIOTIC PO) Take 1 tablet by mouth daily in the afternoon.     tiZANidine (ZANAFLEX) 4 MG tablet TAKE 1 TABLET(S) BY MOUTH TWICE A DAY AS NEEDED FOR MUSCLE PAIN AND SPASMS 60 tablet 1   traMADol (ULTRAM) 50 MG tablet TAKE 1 TABLET BY MOUTH 3 TIMES DAILY AS NEEDED FOR MODERATE PAIN OR SEVERE PAIN. 90 tablet 0   No current facility-administered medications on file prior to visit.    There are no Patient Instructions on file for this visit. No follow-ups on file.   Kris Hartmann, NP

## 2021-01-02 NOTE — Progress Notes (Signed)
Subjective:    Patient ID: Morgan Daniels, female    DOB: August 09, 1960, 60 y.o.   MRN: [id number] No chief complaint on file.   Morgan Daniels is a 60 year old female that presents today for follow-up of her peripheral arterial disease.  The patient was previously seen and noted to have abnormal ABIs.  The patient continues to deny classic claudication-like symptoms but what is more significant is weakness in her lower extremities.  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  She denies any rest pain like symptoms.  She has no wounds or ulcerations.  She also follows up for noted carotid stenosis that has not been evaluated in some time.  She previously had a carotid endarterectomy in 2014.  She denies any TIA or CVA-like symptoms.  No amaurosis fugax.  Today the patient's right ABI is 1.06 with an ABI of 0.68 on the left.  The patient has monophasic tibial artery waveforms bilaterally.  She has normal toe waveforms on the right with dampened on the left.  Carotid show a 1 to 39% stenosis bilaterally.    Review of Systems     Objective:   Physical Exam  There were no vitals taken for this visit.  Past Medical History:  Diagnosis Date   Anxiety    Arthritis    Carotid artery occlusion    Chronic kidney disease May 2017   UTI   Depression    Diabetes ([place])    Diverticulosis    Fatty liver    Fibromyalgia    GERD (gastroesophageal reflux disease)    H/O hiatal hernia    Hypertension    IBS (irritable bowel syndrome)    Peptic ulcer    Plantar fasciitis    Stroke [facility]) Dec. 14,2013   Right side-ministroke    Social History   Socioeconomic History   Marital status: Married    Spouse name: Not on file   Number of children: Not on file   Years of education: Not on file   Highest education level: Not on file  Occupational History   Not on  file  Tobacco Use   Smoking status: Every Day    Packs/day: 1.00    Years: 40.00    Pack years: 40.00    Types: Cigarettes    Start date: 11/09/1970   Smokeless tobacco: Never   Tobacco comments:       Vaping Use   Vaping Use: Former  Substance and Sexual Activity   Alcohol use: Yes    Alcohol/week: 0.0 standard drinks    Comment: rarely   Drug use: No   Sexual activity: Not Currently    Partners: Male  Other Topics Concern   Not on file  Social History Narrative   Not on file   Social Determinants of Health   Financial Resource Strain: Not on file  Food Insecurity: Not on file  Transportation Needs: Not on file  Physical Activity: Not on file  Stress: Not on file  Social Connections: Not on file  Intimate Partner Violence: Not on file    Past Surgical History:  Procedure Laterality Date   Pump Back  2000, 2004   CAROTID ENDARTERECTOMY Left 05/06/12   CARPAL TUNNEL RELEASE Left 07/18/2015   Procedure: CARPAL TUNNEL RELEASE;  Surgeon: Earnestine Leys, MD;  Location:  [facility] ORS;  Service: Orthopedics;  Laterality: Left;   CEREBRAL ANGIOGRAM Bilateral 05/03/2012   Procedure: CEREBRAL ANGIOGRAM;  Surgeon: Angelia Mould, MD;  Location: [facility] CATH LAB;  Service: Cardiovascular;  Laterality: Bilateral;   CHOLECYSTECTOMY  2001   COLONOSCOPY WITH PROPOFOL N/A 11/19/2018   Procedure: COLONOSCOPY WITH PROPOFOL;  Surgeon: Lucilla Lame, MD;  Location: [place];  Service: Endoscopy;  Laterality: N/A;  Diabetic - insulin   ENDARTERECTOMY Left 05/06/2012   Procedure: ENDARTERECTOMY CAROTID;  Surgeon: Angelia Mould, MD;  Location: [place];  Service: Vascular;  Laterality: Left;   ENDARTERECTOMY Right 08/09/2013   Procedure: ENDARTERECTOMY CAROTID-RIGHT;  Surgeon: Angelia Mould, MD;  Location: [place];  Service: Vascular;  Laterality: Right;   HERNIA REPAIR     PATCH ANGIOPLASTY Left 05/06/2012   Procedure: WITH DACRON PATCH  ANGIOPLASTY ;  Surgeon: Angelia Mould, MD;  Location: [place];  Service: Vascular;  Laterality: Left;   POLYPECTOMY  11/19/2018   Procedure: POLYPECTOMY;  Surgeon: Lucilla Lame, MD;  Location: [place];  Service: Endoscopy;;   SPINE SURGERY  2004   TONSILLECTOMY     TUBAL LIGATION      Family History  Problem Relation Age of Onset   Hypertension Mother    Hyperlipidemia Mother    Deep vein thrombosis Mother    Cancer Father    Alcohol abuse Father    Heart failure Father    Hypertension Maternal Grandmother    Deep vein thrombosis Sister    Alcohol abuse Sister    Alcohol abuse Sister     Allergies  Allergen Reactions   Simvastatin Diarrhea and Nausea And Vomiting   Morphine And Related Itching   Milk-Related Compounds Diarrhea    bloating   Latex Rash    Rash when she wears gloves (Negative by test - per pt)    CBC Latest Ref Rng & Units 05/14/2020 02/03/2019 02/03/2019  WBC 4.0 - 10.5 K/uL 7.4 - 9.5  Hemoglobin 12.0 - 15.0 g/dL 15.0 16.3(H) 15.9(H)  Hematocrit 36.0 - 46.0 % 43.3 48.0(H) 46.0  Platelets 150 - 400 K/uL 276 - 299      CMP     Component Value Date/Time   NA 133 (L) 05/14/2020 1025   NA 138 07/30/2013 0436   K 4.1 05/14/2020 1025   K 4.0 07/30/2013 0436   CL 97 (L) 05/14/2020 1025   CL 103 07/30/2013 0436   CO2 26 05/14/2020 1025   CO2 26 07/30/2013 0436   GLUCOSE 281 (H) 05/14/2020 1025   GLUCOSE 109 (H) 07/30/2013 0436   BUN 15 05/14/2020 1025   BUN 15 07/30/2013 0436   CREATININE 0.60 05/14/2020 1025   CREATININE 0.67 07/30/2013 0436   CALCIUM 9.5 05/14/2020 1025   CALCIUM 8.8 07/30/2013 0436   PROT 7.9 05/14/2020 1025   PROT 7.8 07/29/2013 0650   ALBUMIN 4.0 05/14/2020 1025   ALBUMIN 3.5 07/29/2013 0650   AST 22 05/14/2020 1025   AST 25 07/29/2013 0650   ALT 20 05/14/2020 1025   ALT 38 07/29/2013 0650   ALKPHOS 66 05/14/2020 1025   ALKPHOS 83 07/29/2013 0650   BILITOT 0.8 05/14/2020 1025   BILITOT 0.2 07/29/2013  0650   GFRNONAA >60 05/14/2020 1025   GFRNONAA >60 07/30/2013 0436   GFRAA >60 02/03/2019 1126   GFRAA >60 07/30/2013 0436     VAS [place] ABI WITH/WO TBI  Result Date: 12/28/2020  LOWER EXTREMITY DOPPLER STUDY Patient Name:  Morgan Daniels  Assunta Daniels  Date of Exam:   12/25/2020 Medical Rec #: [id number]       Accession #:    [id number] Date of Birth: [birth year]       Patient Gender: F Patient Age:   39 years Exam Location:  [facility] Procedure:      VAS [place] ABI WITH/WO TBI Referring Phys: Eulogio Ditch --------------------------------------------------------------------------------  Indications: Peripheral artery disease.  Comparison Study: 09/21/2020 Performing Technologist: Almira Coaster RVS  Examination Guidelines: A complete evaluation includes at minimum, Doppler waveform signals and systolic blood pressure reading at the level of bilateral brachial, anterior tibial, and posterior tibial arteries, when vessel segments are accessible. Bilateral testing is considered an integral part of a complete examination. Photoelectric Plethysmograph (PPG) waveforms and toe systolic pressure readings are included as required and additional duplex testing as needed. Limited examinations for reoccurring indications may be performed as noted.  ABI Findings: +---------+------------------+-----+----------+--------+ Right    Rt Pressure (mmHg)IndexWaveform  Comment  +---------+------------------+-----+----------+--------+ Brachial 150                                       +---------+------------------+-----+----------+--------+ ATA      169                    monophasic1.06     +---------+------------------+-----+----------+--------+ PTA      116               0.72 monophasic         +---------+------------------+-----+----------+--------+ Great Toe163               1.02 Normal             +---------+------------------+-----+----------+--------+  +---------+------------------+-----+----------+-------+ Left     Lt Pressure (mmHg)IndexWaveform  Comment +---------+------------------+-----+----------+-------+ Brachial 160                                      +---------+------------------+-----+----------+-------+ ATA      108                    monophasic.68     +---------+------------------+-----+----------+-------+ PTA      99                0.62 monophasic        +---------+------------------+-----+----------+-------+ Great Toe107               0.67 Abnormal          +---------+------------------+-----+----------+-------+ +-------+-----------+-----------+------------+------------+ ABI/TBIToday's ABIToday's TBIPrevious ABIPrevious TBI +-------+-----------+-----------+------------+------------+ Right  1.06       1.02       1.06        .74          +-------+-----------+-----------+------------+------------+ Left   .68        .67        .65         .37          +-------+-----------+-----------+------------+------------+ Bilateral ABIs appear essentially unchanged compared to prior study on 09/21/2020. Bilateral TBIs appear increased compared to prior study on 09/21/2020.  Summary: Right: Resting right ankle-brachial index is within normal range. No evidence of significant right lower extremity arterial disease. The right toe-brachial index is normal. ABI's suggest Normal Flow but unreliable due to calcified plaque in the Arteries; Imaging displayed Monophasic Waveforms. Left: Resting left ankle-brachial  index indicates moderate left lower extremity arterial disease. The left toe-brachial index is abnormal.  *See table(s) above for measurements and observations.  Electronically signed by Leotis Pain MD on 12/28/2020 at 9:48:19 AM.    Final        Assessment & Plan:    1. PAD (peripheral artery disease) (HCC) Recommend:  The patient has experienced increased symptoms and is now describing lifestyle limiting  claudication and mild rest pain.   Given the severity of the patient's lower extremity symptoms the patient should undergo angiography and intervention.  Risk and benefits were reviewed the patient.  Indications for the procedure were reviewed.  All questions were answered, the patient agrees to proceed.   The patient should continue walking and begin a more formal exercise program.  The patient should continue antiplatelet therapy and aggressive treatment of the lipid abnormalities  The patient will follow up with me after the angiogram.   - BUN; Standing - Creatinine, serum; Standing  2. TOBACCO ABUSE Smoking cessation was discussed, 3-10 minutes spent on this topic specifically   3. HYPERCHOLESTEROLEMIA Continue statin as ordered and reviewed, no changes at this time     4. Bilateral carotid artery stenosis Recommend:  Given the patient's asymptomatic subcritical stenosis no further invasive testing or surgery at this time.  Duplex ultrasound shows 1-39% stenosis bilaterally.  Continue antiplatelet therapy as prescribed Continue management of CAD, HTN and Hyperlipidemia Healthy heart diet,  encouraged exercise at least 4 times per week Follow up in 12 months with duplex ultrasound and physical exam     Current Outpatient Medications on File Prior to Visit  Medication Sig Dispense Refill   Cholecalciferol (VITAMIN D-3) 5000 UNITS TABS Take 5,000 Units by mouth daily.      Continuous Blood Gluc Receiver (FREESTYLE LIBRE 2 READER) DEVI As directed 1 each 0   Continuous Blood Gluc Sensor (FREESTYLE LIBRE 2 SENSOR) MISC USE TO TEST BLOOD GLUCOSE 4 TIMES A DAY . DX E11.65 2 each 3   dicyclomine (BENTYL) 10 MG capsule Take 1 capsule (10 mg total) by mouth 3 (three) times daily before meals. 90 capsule 2   diphenoxylate-atropine (LOMOTIL) 2.5-0.025 MG tablet Take 1 tablet by mouth 3 (three) times daily as needed for diarrhea or loose stools. 30 tablet 0   fluconazole (DIFLUCAN) 150  MG tablet Take 1 tab po once daily 3 tablet 0   FLUoxetine (PROZAC) 40 MG capsule Take 2 capsules (80 mg total) by mouth daily. 180 capsule 1   gabapentin (NEURONTIN) 600 MG tablet TAKE 1 TABLET BY MOUTH THREE TIMES A DAY. 270 tablet 2   gemfibrozil (LOPID) 600 MG tablet Take 1 tablet (600 mg total) by mouth 2 (two) times daily before a meal. 180 tablet 1   hydrochlorothiazide (HYDRODIURIL) 25 MG tablet TAKE 1 TABLET BY MOUTH DAILY FOR BLOOD PRESSURE/EDEMA 90 tablet 1   insulin aspart (NOVOLOG FLEXPEN) 100 UNIT/ML FlexPen Inject 20-26 Units into the skin 3 (three) times daily before meals. 30 mL 2   insulin degludec (TRESIBA FLEXTOUCH) 200 UNIT/ML FlexTouch Pen Inject 100 Units into the skin at bedtime. 30 mL 2   Insulin Pen Needle (ADVOCATE INSULIN PEN NEEDLES) 33G X 4 MM MISC To use with insulin dosing TID prior to meals and QHS for basal insulin. 100 each 2   metoprolol succinate (TOPROL-XL) 50 MG 24 hr tablet Take 1 tablet (50 mg total) by mouth daily. Take with or immediately following a meal. 90 tablet 1   omeprazole (  PRILOSEC) 20 MG capsule Take 20 mg by mouth daily as needed.     ondansetron (ZOFRAN ODT) 8 MG disintegrating tablet Take 1 tablet (8 mg total) by mouth every 8 (eight) hours as needed for nausea. 30 tablet 2   ONETOUCH DELICA LANCETS FINE MISC CHECK BLOOD SUGAR THREE TIMES A DAY  5   ONETOUCH VERIO test strip CHECK BLOOD SUGAR THREE TIMES A DAY  5   Probiotic Product (PROBIOTIC PO) Take 1 tablet by mouth daily in the afternoon.     tiZANidine (ZANAFLEX) 4 MG tablet TAKE 1 TABLET(S) BY MOUTH TWICE A DAY AS NEEDED FOR MUSCLE PAIN AND SPASMS 60 tablet 1   traMADol (ULTRAM) 50 MG tablet TAKE 1 TABLET BY MOUTH 3 TIMES DAILY AS NEEDED FOR MODERATE PAIN OR SEVERE PAIN. 90 tablet 0   No current facility-administered medications on file prior to visit.    There are no Patient Instructions on file for this visit. No follow-ups on file.   Kris Hartmann, NP

## 2021-01-03 ENCOUNTER — Ambulatory Visit
Admission: RE | Admit: 2021-01-03 | Discharge: 2021-01-03 | Disposition: A | Discharge status: Home / Self Care | Payer: Medicare Other | Source: Ambulatory Visit | Attending: Vascular Surgery | Admitting: Vascular Surgery

## 2021-01-03 ENCOUNTER — Encounter
Admission: RE | Disposition: A | Discharge status: Home / Self Care | Payer: Self-pay | Source: Ambulatory Visit | Attending: Vascular Surgery

## 2021-01-03 ENCOUNTER — Encounter: Payer: Self-pay | Admitting: Vascular Surgery

## 2021-01-03 ENCOUNTER — Other Ambulatory Visit: Payer: Self-pay

## 2021-01-03 DIAGNOSIS — E7800 Pure hypercholesterolemia, unspecified: Secondary | ICD-10-CM | POA: Insufficient documentation

## 2021-01-03 DIAGNOSIS — I70222 Atherosclerosis of native arteries of extremities with rest pain, left leg: Secondary | ICD-10-CM | POA: Diagnosis not present

## 2021-01-03 DIAGNOSIS — I10 Essential (primary) hypertension: Secondary | ICD-10-CM | POA: Insufficient documentation

## 2021-01-03 DIAGNOSIS — I2510 Atherosclerotic heart disease of native coronary artery without angina pectoris: Secondary | ICD-10-CM | POA: Insufficient documentation

## 2021-01-03 DIAGNOSIS — I6523 Occlusion and stenosis of bilateral carotid arteries: Secondary | ICD-10-CM | POA: Insufficient documentation

## 2021-01-03 DIAGNOSIS — E1151 Type 2 diabetes mellitus with diabetic peripheral angiopathy without gangrene: Secondary | ICD-10-CM | POA: Diagnosis not present

## 2021-01-03 DIAGNOSIS — I70212 Atherosclerosis of native arteries of extremities with intermittent claudication, left leg: Secondary | ICD-10-CM | POA: Diagnosis not present

## 2021-01-03 DIAGNOSIS — E785 Hyperlipidemia, unspecified: Secondary | ICD-10-CM | POA: Diagnosis not present

## 2021-01-03 DIAGNOSIS — I739 Peripheral vascular disease, unspecified: Secondary | ICD-10-CM

## 2021-01-03 DIAGNOSIS — I70219 Atherosclerosis of native arteries of extremities with intermittent claudication, unspecified extremity: Secondary | ICD-10-CM

## 2021-01-03 HISTORY — PX: LOWER EXTREMITY ANGIOGRAPHY: CATH118251

## 2021-01-03 LAB — BUN: BUN: 24 mg/dL — ABNORMAL HIGH (ref 6–20)

## 2021-01-03 LAB — CREATININE, SERUM
Creatinine, Ser: 0.61 mg/dL (ref 0.44–1.00)
GFR, Estimated: >60 mL/min (ref >60)

## 2021-01-03 LAB — GLUCOSE, CAPILLARY
Glucose-Capillary: 174 mg/dL — ABNORMAL HIGH (ref 70–99)
Glucose-Capillary: 158 mg/dL — ABNORMAL HIGH (ref 70–99)

## 2021-01-03 SURGERY — LOWER EXTREMITY ANGIOGRAPHY
Anesthesia: Moderate Sedation | Site: Leg Lower | Laterality: Left

## 2021-01-03 MED ORDER — ASPIRIN EC 81 MG PO TBEC: 81.0000 mg | DELAYED_RELEASE_TABLET | Freq: Every day | ORAL | Status: DC

## 2021-01-03 MED ORDER — SODIUM CHLORIDE 0.9 % IV SOLN: 250.0000 mL | INTRAVENOUS | Status: DC | PRN

## 2021-01-03 MED ORDER — FENTANYL CITRATE PF 50 MCG/ML IJ SOSY
PREFILLED_SYRINGE | INTRAMUSCULAR | Status: AC
  Filled 2021-01-03: qty 1

## 2021-01-03 MED ORDER — CLOPIDOGREL BISULFATE 75 MG PO TABS: 75.0000 mg | ORAL_TABLET | Freq: Every day | ORAL | Status: DC

## 2021-01-03 MED ORDER — SODIUM CHLORIDE 0.9% FLUSH: 3.0000 mL | INTRAVENOUS | Status: DC | PRN

## 2021-01-03 MED ORDER — MIDAZOLAM HCL 2 MG/2ML IJ SOLN
INTRAMUSCULAR | Status: DC | PRN
  Administered 2021-01-03: 2 mg via INTRAVENOUS

## 2021-01-03 MED ORDER — ASPIRIN EC 81 MG PO TBEC: 81.0000 mg | DELAYED_RELEASE_TABLET | Freq: Every day | ORAL | 2 refills | Status: DC

## 2021-01-03 MED ORDER — MIDAZOLAM HCL 2 MG/ML PO SYRP: 8.0000 mg | ORAL_SOLUTION | Freq: Once | ORAL | Status: DC | PRN

## 2021-01-03 MED ORDER — ACETAMINOPHEN 325 MG PO TABS: 650.0000 mg | ORAL_TABLET | ORAL | Status: DC | PRN

## 2021-01-03 MED ORDER — ROSUVASTATIN CALCIUM 10 MG PO TABS
10.0000 mg | ORAL_TABLET | Freq: Every day | ORAL | Status: DC
  Administered 2021-01-03: 11:00:00 10 mg via ORAL
  Filled 2021-01-03: qty 1

## 2021-01-03 MED ORDER — ONDANSETRON HCL 4 MG/2ML IJ SOLN: 4.0000 mg | Freq: Four times a day (QID) | INTRAMUSCULAR | Status: DC | PRN

## 2021-01-03 MED ORDER — SODIUM CHLORIDE 0.9% FLUSH: 3.0000 mL | Freq: Two times a day (BID) | INTRAVENOUS | Status: DC

## 2021-01-03 MED ORDER — ASPIRIN EC 81 MG PO TBEC
DELAYED_RELEASE_TABLET | ORAL | Status: AC
  Administered 2021-01-03: 11:00:00 81 mg via ORAL
  Filled 2021-01-03: qty 1

## 2021-01-03 MED ORDER — MIDAZOLAM HCL 5 MG/5ML IJ SOLN
INTRAMUSCULAR | Status: AC
  Filled 2021-01-03: qty 5

## 2021-01-03 MED ORDER — HEPARIN SODIUM (PORCINE) 1000 UNIT/ML IJ SOLN
INTRAMUSCULAR | Status: DC | PRN
  Administered 2021-01-03: 5000 [IU] via INTRAVENOUS

## 2021-01-03 MED ORDER — METHYLPREDNISOLONE SODIUM SUCC 125 MG IJ SOLR: 125.0000 mg | Freq: Once | INTRAMUSCULAR | Status: DC | PRN

## 2021-01-03 MED ORDER — ROSUVASTATIN CALCIUM 10 MG PO TABS: 10.0000 mg | ORAL_TABLET | Freq: Every day | ORAL | 11 refills | Status: DC

## 2021-01-03 MED ORDER — HEPARIN SODIUM (PORCINE) 1000 UNIT/ML IJ SOLN
INTRAMUSCULAR | Status: AC
  Filled 2021-01-03: qty 1

## 2021-01-03 MED ORDER — FAMOTIDINE 20 MG PO TABS: 40.0000 mg | ORAL_TABLET | Freq: Once | ORAL | Status: DC | PRN

## 2021-01-03 MED ORDER — LABETALOL HCL 5 MG/ML IV SOLN: 10.0000 mg | INTRAVENOUS | Status: DC | PRN

## 2021-01-03 MED ORDER — FENTANYL CITRATE (PF) 100 MCG/2ML IJ SOLN
INTRAMUSCULAR | Status: DC | PRN
  Administered 2021-01-03: 50 ug via INTRAVENOUS

## 2021-01-03 MED ORDER — CLOPIDOGREL BISULFATE 75 MG PO TABS
ORAL_TABLET | ORAL | Status: AC
  Administered 2021-01-03: 11:00:00 75 mg via ORAL
  Filled 2021-01-03: qty 1

## 2021-01-03 MED ORDER — SODIUM CHLORIDE 0.9 % IV SOLN: INTRAVENOUS | Status: DC

## 2021-01-03 MED ORDER — CLOPIDOGREL BISULFATE 75 MG PO TABS: 75.0000 mg | ORAL_TABLET | Freq: Every day | ORAL | 11 refills | Status: DC

## 2021-01-03 MED ORDER — HYDRALAZINE HCL 20 MG/ML IJ SOLN: 5.0000 mg | INTRAMUSCULAR | Status: DC | PRN

## 2021-01-03 MED ORDER — CEFAZOLIN SODIUM-DEXTROSE 2-4 GM/100ML-% IV SOLN
2.0000 g | Freq: Once | INTRAVENOUS | Status: AC
  Administered 2021-01-03: 08:00:00 2 g via INTRAVENOUS

## 2021-01-03 MED ORDER — DIPHENHYDRAMINE HCL 50 MG/ML IJ SOLN: 50.0000 mg | Freq: Once | INTRAMUSCULAR | Status: DC | PRN

## 2021-01-03 SURGICAL SUPPLY — 22 items
BALLN LUTONIX 018 5X220X130 (BALLOONS) ×2
BALLN LUTONIX 018 5X300X130 (BALLOONS) ×2
BALLN ULTRVRSE 2.5X220X150 (BALLOONS) ×2
BALLOON LUTONIX 018 5X220X130 (BALLOONS) IMPLANT
BALLOON LUTONIX 018 5X300X130 (BALLOONS) IMPLANT
BALLOON ULTRVRSE 2.5X220X150 (BALLOONS) IMPLANT
CATH ANGIO 5F PIGTAIL 65CM (CATHETERS) ×1 IMPLANT
CATH BEACON 5 .038 100 VERT TP (CATHETERS) ×1 IMPLANT
CATH ROTAREX 135 6FR (CATHETERS) ×1 IMPLANT
COVER PROBE U/S 5X48 (MISCELLANEOUS) ×1 IMPLANT
DEVICE STARCLOSE SE CLOSURE (Vascular Products) ×1 IMPLANT
GAUZE SPONGE 4X4 12PLY STRL (GAUZE/BANDAGES/DRESSINGS) ×2 IMPLANT
GLIDEWIRE ADV .035X260CM (WIRE) ×1 IMPLANT
KIT ENCORE 26 ADVANTAGE (KITS) ×1 IMPLANT
PACK ANGIOGRAPHY (CUSTOM PROCEDURE TRAY) ×2 IMPLANT
SHEATH ANL2 6FRX45 HC (SHEATH) ×1 IMPLANT
SHEATH BRITE TIP 5FRX11 (SHEATH) ×1 IMPLANT
STENT VIABAHN 6X250X120 (Permanent Stent) ×1 IMPLANT
SYR MEDRAD MARK 7 150ML (SYRINGE) ×1 IMPLANT
TUBING CONTRAST HIGH PRESS 72 (TUBING) ×1 IMPLANT
WIRE G V18X300CM (WIRE) ×1 IMPLANT
WIRE GUIDERIGHT .035X150 (WIRE) ×1 IMPLANT

## 2021-01-03 NOTE — Interval H&P Note (Signed)
History and Physical Interval Note:  01/03/2021 8:07 AM  Morgan Daniels  has presented today for surgery, with the diagnosis of LLE Angio  BARD    ASO w claudication.  The various methods of treatment have been discussed with the patient and family. After consideration of risks, benefits and other options for treatment, the patient has consented to  Procedure(s): LOWER EXTREMITY ANGIOGRAPHY (Left) as a surgical intervention.  The patient's history has been reviewed, patient examined, no change in status, stable for surgery.  I have reviewed the patient's chart and labs.  Questions were answered to the patient's satisfaction.     Leotis Pain

## 2021-01-03 NOTE — Interval H&P Note (Signed)
History and Physical Interval Note:  01/03/2021 8:08 AM  Morgan Daniels  has presented today for surgery, with the diagnosis of LLE Angio  BARD    ASO w claudication.  The various methods of treatment have been discussed with the patient and family. After consideration of risks, benefits and other options for treatment, the patient has consented to  Procedure(s): LOWER EXTREMITY ANGIOGRAPHY (Left) as a surgical intervention.  The patient's history has been reviewed, patient examined, no change in status, stable for surgery.  I have reviewed the patient's chart and labs.  Questions were answered to the patient's satisfaction.   Review of systems Positive for pain in legs with walking, claudication, joint pain, and GERD. Other systems reviewed and negative   Leotis Pain

## 2021-01-03 NOTE — Op Note (Signed)
[facility]  Percutaneous Study/Intervention Procedural Note   Date of Surgery: 01/03/2021  Surgeon(s):Aikam Hellickson    Assistants:none  Pre-operative Diagnosis: PAD with claudication left lower extremity  Post-operative diagnosis:  Same  Procedure(s) Performed:             1.  Ultrasound guidance for vascular access right femoral artery             2.  Catheter placement into left common femoral artery from right femoral approach             3.  Aortogram and selective left lower extremity angiogram             4.  Percutaneous transluminal angioplasty of left anterior tibial artery with 2.5 mm diameter by 22 cm length angioplasty balloon             5.  Mechanical thrombectomy of the left SFA and proximal popliteal arteries with the [place] Rex device  6.  Percutaneous transluminal angioplasty of the left SFA and proximal popliteal arteries with 5 mm diameter by 30 cm length Lutonix drug-coated angioplasty balloon  7.  Viabahn stent placement to the left SFA with 6 mm diameter by 25 cm length Viabahn stent for multiple areas of greater than 50% stenosis after angioplasty and thrombectomy             8.  StarClose closure device right femoral artery  EBL: 10 cc  Contrast: 62 cc  Fluoro Time: 5.6 minutes  Moderate Conscious Sedation Time: approximately 37 minutes using 2 mg of Versed and 50 mcg of Fentanyl              Indications:  Patient is a 60 y.o.female with disabling claudication symptoms of the left leg. The patient has noninvasive study showing left ABI reduced in the 0.6 range. The patient is brought in for angiography for further evaluation and potential treatment. Risks and benefits are discussed and informed consent is obtained.   Procedure:  The patient was identified and appropriate procedural time out was performed.  The patient was then placed supine on the table and prepped and draped in the usual sterile fashion. Moderate conscious sedation was  administered during a face to face encounter with the patient throughout the procedure with my supervision of the RN administering medicines and monitoring the patient's vital signs, pulse oximetry, telemetry and mental status throughout from the start of the procedure until the patient was taken to the recovery room. Ultrasound was used to evaluate the right common femoral artery.  It was patent .  A digital ultrasound image was acquired.  A Seldinger needle was used to access the right common femoral artery under direct ultrasound guidance and a permanent image was performed.  A 0.035 J wire was advanced without resistance and a 5Fr sheath was placed.  Pigtail catheter was placed into the aorta and an AP aortogram was performed. This demonstrated that the left renal artery appeared to have at least a moderate stenosis of greater than 50%.  The right renal artery did not appear to have any stenosis.  The aorta and iliac arteries were highly calcific but not stenotic. I then crossed the aortic bifurcation and advanced to the left femoral head. Selective left lower extremity angiogram was then performed. This demonstrated diffuse disease of the left SFA with multiple areas of greater than 70% stenosis and what appeared to be a short segment occlusion versus a string sign stenosis at Hunter's canal.  There was some poststenotic dilatation and mild disease in the proximal popliteal artery but the mid popliteal artery normalized.  The anterior tibial artery had a high medial takeoff at the level of the knee.  There was a short segment occlusion in the mid anterior tibial artery with some moderate disease above and below this over about a 10 to 15 cm span.  The peroneal artery was small but patent.  The posterior tibial artery had a longer segment occlusion with poor distal reconstitution. It was felt that it was in the patient's best interest to proceed with intervention after these images to avoid a second procedure  and a larger amount of contrast and fluoroscopy based off of the findings from the initial angiogram. The patient was systemically heparinized and a 6 [place] Ansell sheath was then placed over the [employer] wire. I then used a Kumpe catheter and the advantage wire to navigate through the SFA and popliteal disease and confirm flow in the popliteal artery below the lesions.  I then exchanged for a V 18 wire and cannulated the anterior tibial artery with the Kumpe catheter without difficulty and then cross the occlusion in the mid anterior tibial artery parking the wire down in the foot.  The diagnostic catheter was removed.  I then proceeded with treatment.  I elected to debulk the chronic thrombus in the SFA and popliteal arteries with the [place] Rex device.  This is brought on the field and the 6 [place] Rex device was used for mechanical thrombectomy with 2 passes in the left SFA and most proximal popliteal artery.  Completion imaging showed some improvement but multiple areas residual greater than 70% stenosis after thrombectomy.  A 5 mm diameter by 30 cm length angioplasty balloon was then inflated to 10 atm for 1 minute in the left SFA and proximal popliteal artery.  A 2.5 mm diameter by 22 cm length angioplasty balloon was then inflated in the anterior tibial artery up to 12 atm for 1 minute.  Completion imaging showed about a 20 to 25% residual stenosis in the anterior tibial artery, but there were 2 areas of greater than 50% residual stenosis in the left SFA and elected to treat these areas with stent placement.  A 6 mm diameter by 25 cm length Viabahn stent was then deployed in the left SFA and postdilated with a 5 mm diameter Lutonix drug-coated angioplasty balloon with excellent angiographic completion result and less than 10% residual stenosis. I elected to terminate the procedure. The sheath was removed and StarClose closure device was deployed in the right femoral artery with excellent  hemostatic result. The patient was taken to the recovery room in stable condition having tolerated the procedure well.  Findings:               Aortogram: This demonstrated that the left renal artery appeared to have at least a moderate stenosis of greater than 50%.  The right renal artery did not appear to have any stenosis.  The aorta and iliac arteries were highly calcific but not stenotic.             Left lower Extremity:  This demonstrated diffuse disease of the left SFA with multiple areas of greater than 70% stenosis and what appeared to be a short segment occlusion versus a string sign stenosis at Hunter's canal.  There was some poststenotic dilatation and mild disease in the proximal popliteal artery but the mid popliteal artery normalized.  The anterior tibial  artery had a high medial takeoff at the level of the knee.  There was a short segment occlusion in the mid anterior tibial artery with some moderate disease above and below this over about a 10 to 15 cm span.  The peroneal artery was small but patent.  The posterior tibial artery had a longer segment occlusion with poor distal reconstitution.   Disposition: Patient was taken to the recovery room in stable condition having tolerated the procedure well.  Complications: None  Leotis Pain 01/03/2021 9:38 AM   This note was created with Dragon Medical transcription system. Any errors in dictation are purely unintentional.

## 2021-01-04 ENCOUNTER — Encounter (INDEPENDENT_AMBULATORY_CARE_PROVIDER_SITE_OTHER): Payer: Self-pay

## 2021-01-05 ENCOUNTER — Encounter (INDEPENDENT_AMBULATORY_CARE_PROVIDER_SITE_OTHER): Payer: Self-pay | Admitting: Nurse Practitioner

## 2021-01-05 NOTE — Progress Notes (Signed)
Subjective:    Patient ID: Morgan Daniels, female    DOB: 05-Mar-1960, 60 y.o.   MRN: [id number] Chief Complaint  Patient presents with   Follow-up    3 mo [place]    Morgan Daniels is a 60 year old female that presents today for follow-up of her peripheral arterial disease.  The patient was previously seen and noted to have abnormal ABIs.  The patient continues to deny classic claudication-like symptoms but what is more significant is weakness in her lower extremities.  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  She denies any rest pain like symptoms.  She has no wounds or ulcerations.  She also follows up for noted carotid stenosis that has not been evaluated in some time.  She previously had a carotid endarterectomy in 2014.  She denies any TIA or CVA-like symptoms.  No amaurosis fugax.   Today the patient's right ABI is 1.06 with an ABI of 0.68 on the left.  The patient has monophasic tibial artery waveforms bilaterally.  She has normal toe waveforms on the right with dampened on the left.   Carotid show a 1 to 39% stenosis bilaterally.   Review of Systems  Neurological:  Positive for weakness.  All other systems reviewed and are negative.     Objective:   Physical Exam Vitals reviewed.  HENT:     Head: Normocephalic.  Cardiovascular:     Rate and Rhythm: Normal rate.  Pulmonary:     Effort: Pulmonary effort is normal.  Skin:    General: Skin is warm and dry.  Neurological:     Mental Status: She is alert and oriented to person, place, and time.  Psychiatric:        Mood and Affect: Mood normal.        Behavior: Behavior normal.        Thought Content: Thought content normal.        Judgment: Judgment normal.    BP (!) 186/75   Pulse 88   Ht 5\' 7"  (1.702 m)   Wt 197 lb (89.4 kg)   BMI 30.85 kg/m   Past Medical History:  Diagnosis Date    Anxiety    Arthritis    Carotid artery occlusion    Chronic kidney disease May 2017   UTI   Depression    Diabetes ([place])    Diverticulosis    Fatty liver    Fibromyalgia    GERD (gastroesophageal reflux disease)    H/O hiatal hernia    Hypertension    IBS (irritable bowel syndrome)    Peptic ulcer    Plantar fasciitis    Stroke [facility]) Dec. 14,2013   Right side-ministroke    Social History   Socioeconomic History   Marital status: Married    Spouse name: Not on file   Number of children: Not on file   Years of education: Not on file   Highest education level: Not on file  Occupational History   Not on file  Tobacco Use   Smoking status: Every Day    Packs/day: 1.00    Years: 40.00    Pack years: 40.00    Types: Cigarettes    Start date: 11/09/1970   Smokeless tobacco: Never   Tobacco comments:       Vaping Use   Vaping Use: Former  Substance and Sexual Activity   Alcohol use: Yes    Alcohol/week: 0.0 standard drinks    Comment: rarely   Drug use: No   Sexual activity: Not Currently    Partners: Male  Other Topics Concern   Not on file  Social History Narrative   Not on file   Social Determinants of Health   Financial Resource Strain: Not on file  Food Insecurity: Not on file  Transportation Needs: Not on file  Physical Activity: Not on file  Stress: Not on file  Social Connections: Not on file  Intimate Partner Violence: Not on file    Past Surgical History:  Procedure Laterality Date   [place]  2000, 2004   CAROTID ENDARTERECTOMY Left 05/06/12   CARPAL TUNNEL RELEASE Left 07/18/2015   Procedure: CARPAL TUNNEL RELEASE;  Surgeon: Earnestine Leys, MD;  Location: [facility] ORS;  Service: Orthopedics;  Laterality: Left;   CEREBRAL ANGIOGRAM Bilateral 05/03/2012   Procedure: CEREBRAL ANGIOGRAM;  Surgeon: Angelia Mould, MD;  Location: [facility] CATH LAB;  Service: Cardiovascular;  Laterality: Bilateral;   CHOLECYSTECTOMY   2001   COLONOSCOPY WITH PROPOFOL N/A 11/19/2018   Procedure: COLONOSCOPY WITH PROPOFOL;  Surgeon: Lucilla Lame, MD;  Location: [place];  Service: Endoscopy;  Laterality: N/A;  Diabetic - insulin   ENDARTERECTOMY Left 05/06/2012   Procedure: ENDARTERECTOMY CAROTID;  Surgeon: Angelia Mould, MD;  Location: [place];  Service: Vascular;  Laterality: Left;   ENDARTERECTOMY Right 08/09/2013   Procedure: ENDARTERECTOMY CAROTID-RIGHT;  Surgeon: Angelia Mould, MD;  Location: [place];  Service: Vascular;  Laterality: Right;   HERNIA REPAIR     LOWER EXTREMITY ANGIOGRAPHY Left 01/03/2021   Procedure: LOWER EXTREMITY ANGIOGRAPHY;  Surgeon: Algernon Huxley, MD;  Location: [place] CV LAB;  Service: Cardiovascular;  Laterality: Left;   PATCH ANGIOPLASTY Left 05/06/2012   Procedure: WITH DACRON PATCH ANGIOPLASTY ;  Surgeon: Angelia Mould, MD;  Location: [facility] OR;  Service: Vascular;  Laterality: Left;   POLYPECTOMY  11/19/2018   Procedure: POLYPECTOMY;  Surgeon: Lucilla Lame, MD;  Location: [place];  Service: Endoscopy;;   SPINE SURGERY  2004   TONSILLECTOMY     TUBAL LIGATION      Family History  Problem Relation Age of Onset   Hypertension Mother    Hyperlipidemia Mother    Deep vein thrombosis Mother    Cancer Father    Alcohol abuse Father    Heart failure Father    Hypertension Maternal Grandmother    Deep vein thrombosis Sister    Alcohol abuse Sister    Alcohol abuse Sister     Allergies  Allergen Reactions   Simvastatin Diarrhea and Nausea And Vomiting   Morphine And Related Itching   Milk-Related Compounds Diarrhea    bloating   Latex Rash    Rash when she wears gloves (Negative by test - per pt)    CBC Latest Ref Rng & Units 05/14/2020 02/03/2019 02/03/2019  WBC 4.0 - 10.5 K/uL 7.4 - 9.5  Hemoglobin 12.0 - 15.0 g/dL 15.0 16.3(H) 15.9(H)  Hematocrit 36.0 - 46.0 % 43.3 48.0(H) 46.0  Platelets 150 - 400 K/uL 276 - 299      CMP      Component Value Date/Time   NA 133 (L) 05/14/2020 1025   NA 138 07/30/2013 0436   K 4.1 05/14/2020 1025   K 4.0 07/30/2013 0436   CL 97 (L) 05/14/2020 1025  CL 103 07/30/2013 0436   CO2 26 05/14/2020 1025   CO2 26 07/30/2013 0436   GLUCOSE 281 (H) 05/14/2020 1025   GLUCOSE 109 (H) 07/30/2013 0436   BUN 24 (H) 01/03/2021 0725   BUN 15 07/30/2013 0436   CREATININE 0.61 01/03/2021 0725   CREATININE 0.67 07/30/2013 0436   CALCIUM 9.5 05/14/2020 1025   CALCIUM 8.8 07/30/2013 0436   PROT 7.9 05/14/2020 1025   PROT 7.8 07/29/2013 0650   ALBUMIN 4.0 05/14/2020 1025   ALBUMIN 3.5 07/29/2013 0650   AST 22 05/14/2020 1025   AST 25 07/29/2013 0650   ALT 20 05/14/2020 1025   ALT 38 07/29/2013 0650   ALKPHOS 66 05/14/2020 1025   ALKPHOS 83 07/29/2013 0650   BILITOT 0.8 05/14/2020 1025   BILITOT 0.2 07/29/2013 0650   GFRNONAA >60 01/03/2021 0725   GFRNONAA >60 07/30/2013 0436   GFRAA >60 02/03/2019 1126   GFRAA >60 07/30/2013 0436     VAS [place] ABI WITH/WO TBI  Result Date: 12/28/2020  LOWER EXTREMITY DOPPLER STUDY Patient Name:  CATERIN TABARES  Date of Exam:   12/25/2020 Medical Rec #: [id number]       Accession #:    [id number] Date of Birth: 1960/06/03       Patient Gender: F Patient Age:   55 years Exam Location:  [facility] Procedure:      VAS [place] ABI WITH/WO TBI Referring Phys: Arna Medici Sharla Tankard --------------------------------------------------------------------------------  Indications: Peripheral artery disease.  Comparison Study: 09/21/2020 Performing Technologist: Almira Coaster RVS  Examination Guidelines: A complete evaluation includes at minimum, Doppler waveform signals and systolic blood pressure reading at the level of bilateral brachial, anterior tibial, and posterior tibial arteries, when vessel segments are accessible. Bilateral testing is considered an integral part of a complete examination. Photoelectric Plethysmograph (PPG) waveforms and toe systolic pressure  readings are included as required and additional duplex testing as needed. Limited examinations for reoccurring indications may be performed as noted.  ABI Findings: +---------+------------------+-----+----------+--------+ Right    Rt Pressure (mmHg)IndexWaveform  Comment  +---------+------------------+-----+----------+--------+ Brachial 150                                       +---------+------------------+-----+----------+--------+ ATA      169                    monophasic1.06     +---------+------------------+-----+----------+--------+ PTA      116               0.72 monophasic         +---------+------------------+-----+----------+--------+ Great Toe163               1.02 Normal             +---------+------------------+-----+----------+--------+ +---------+------------------+-----+----------+-------+ Left     Lt Pressure (mmHg)IndexWaveform  Comment +---------+------------------+-----+----------+-------+ Brachial 160                                      +---------+------------------+-----+----------+-------+ ATA      108                    monophasic.68     +---------+------------------+-----+----------+-------+ PTA      99                0.62 monophasic        +---------+------------------+-----+----------+-------+  Great Toe107               0.67 Abnormal          +---------+------------------+-----+----------+-------+ +-------+-----------+-----------+------------+------------+ ABI/TBIToday's ABIToday's TBIPrevious ABIPrevious TBI +-------+-----------+-----------+------------+------------+ Right  1.06       1.02       1.06        .74          +-------+-----------+-----------+------------+------------+ Left   .68        .67        .65         .37          +-------+-----------+-----------+------------+------------+ Bilateral ABIs appear essentially unchanged compared to prior study on 09/21/2020. Bilateral TBIs appear increased  compared to prior study on 09/21/2020.  Summary: Right: Resting right ankle-brachial index is within normal range. No evidence of significant right lower extremity arterial disease. The right toe-brachial index is normal. ABI's suggest Normal Flow but unreliable due to calcified plaque in the Arteries; Imaging displayed Monophasic Waveforms. Left: Resting left ankle-brachial index indicates moderate left lower extremity arterial disease. The left toe-brachial index is abnormal.  *See table(s) above for measurements and observations.  Electronically signed by Leotis Pain MD on 12/28/2020 at 9:48:19 AM.    Final        Assessment & Plan:   1. PAD (peripheral artery disease) (HCC) Recommend:   The patient has experienced increased symptoms and is now describing lifestyle limiting claudication and mild rest pain.     Given the severity of the patient's lower extremity symptoms the patient should undergo angiography and intervention.  Risk and benefits were reviewed the patient.  Indications for the procedure were reviewed.  All questions were answered, the patient agrees to proceed.    The patient should continue walking and begin a more formal exercise program.  The patient should continue antiplatelet therapy and aggressive treatment of the lipid abnormalities   The patient will follow up with me after the angiogram.    2. Bilateral carotid artery stenosis Recommend:   Given the patient's asymptomatic subcritical stenosis no further invasive testing or surgery at this time.   Duplex ultrasound shows 1-39% stenosis bilaterally.   Continue antiplatelet therapy as prescribed Continue management of CAD, HTN and Hyperlipidemia Healthy heart diet,  encouraged exercise at least 4 times per week Follow up in 12 months with duplex ultrasound and physical exam      3. HYPERCHOLESTEROLEMIA Continue statin as ordered and reviewed, no changes at this time   4. TOBACCO ABUSE Smoking cessation was  discussed, 3-10 minutes spent on this topic specifically    Current Outpatient Medications on File Prior to Visit  Medication Sig Dispense Refill   Cholecalciferol (VITAMIN D-3) 5000 UNITS TABS Take 5,000 Units by mouth daily.      Continuous Blood Gluc Receiver (FREESTYLE LIBRE 2 READER) DEVI As directed 1 each 0   Continuous Blood Gluc Sensor (FREESTYLE LIBRE 2 SENSOR) MISC USE TO TEST BLOOD GLUCOSE 4 TIMES A DAY . DX E11.65 2 each 3   dicyclomine (BENTYL) 10 MG capsule Take 1 capsule (10 mg total) by mouth 3 (three) times daily before meals. 90 capsule 2   diphenoxylate-atropine (LOMOTIL) 2.5-0.025 MG tablet Take 1 tablet by mouth 3 (three) times daily as needed for diarrhea or loose stools. 30 tablet 0   fluconazole (DIFLUCAN) 150 MG tablet Take 1 tab po once daily (Patient not taking: Reported on 01/03/2021) 3 tablet 0   FLUoxetine (PROZAC) 40 MG capsule  Take 2 capsules (80 mg total) by mouth daily. 180 capsule 1   gabapentin (NEURONTIN) 600 MG tablet TAKE 1 TABLET BY MOUTH THREE TIMES A DAY. 270 tablet 2   gemfibrozil (LOPID) 600 MG tablet Take 1 tablet (600 mg total) by mouth 2 (two) times daily before a meal. 180 tablet 1   hydrochlorothiazide (HYDRODIURIL) 25 MG tablet TAKE 1 TABLET BY MOUTH DAILY FOR BLOOD PRESSURE/EDEMA 90 tablet 1   insulin aspart (NOVOLOG FLEXPEN) 100 UNIT/ML FlexPen Inject 20-26 Units into the skin 3 (three) times daily before meals. 30 mL 2   insulin degludec (TRESIBA FLEXTOUCH) 200 UNIT/ML FlexTouch Pen Inject 100 Units into the skin at bedtime. 30 mL 2   Insulin Pen Needle (ADVOCATE INSULIN PEN NEEDLES) 33G X 4 MM MISC To use with insulin dosing TID prior to meals and QHS for basal insulin. 100 each 2   metoprolol succinate (TOPROL-XL) 50 MG 24 hr tablet Take 1 tablet (50 mg total) by mouth daily. Take with or immediately following a meal. 90 tablet 1   omeprazole (PRILOSEC) 20 MG capsule Take 20 mg by mouth daily as needed.     ondansetron (ZOFRAN ODT) 8 MG  disintegrating tablet Take 1 tablet (8 mg total) by mouth every 8 (eight) hours as needed for nausea. (Patient not taking: Reported on 01/03/2021) 30 tablet 2   ONETOUCH DELICA LANCETS FINE MISC CHECK BLOOD SUGAR THREE TIMES A DAY  5   ONETOUCH VERIO test strip CHECK BLOOD SUGAR THREE TIMES A DAY  5   Probiotic Product (PROBIOTIC PO) Take 1 tablet by mouth daily in the afternoon.     tiZANidine (ZANAFLEX) 4 MG tablet TAKE 1 TABLET(S) BY MOUTH TWICE A DAY AS NEEDED FOR MUSCLE PAIN AND SPASMS 60 tablet 1   traMADol (ULTRAM) 50 MG tablet TAKE 1 TABLET BY MOUTH 3 TIMES DAILY AS NEEDED FOR MODERATE PAIN OR SEVERE PAIN. 90 tablet 0   No current facility-administered medications on file prior to visit.    There are no Patient Instructions on file for this visit. No follow-ups on file.   Kris Hartmann, NP

## 2021-01-12 ENCOUNTER — Inpatient Hospital Stay
Admission: EM | Admit: 2021-01-12 | Discharge: 2021-01-13 | DRG: 981 | Disposition: A | Discharge status: Other Acute Inpatient Hospital | Payer: Medicare Other | Attending: Cardiology | Admitting: Cardiology

## 2021-01-12 ENCOUNTER — Emergency Department: Payer: Medicare Other

## 2021-01-12 DIAGNOSIS — Z20822 Contact with and (suspected) exposure to covid-19: Secondary | ICD-10-CM | POA: Diagnosis present

## 2021-01-12 DIAGNOSIS — R569 Unspecified convulsions: Secondary | ICD-10-CM | POA: Diagnosis not present

## 2021-01-12 DIAGNOSIS — I110 Hypertensive heart disease with heart failure: Secondary | ICD-10-CM | POA: Diagnosis present

## 2021-01-12 DIAGNOSIS — K219 Gastro-esophageal reflux disease without esophagitis: Secondary | ICD-10-CM | POA: Diagnosis present

## 2021-01-12 DIAGNOSIS — E7800 Pure hypercholesterolemia, unspecified: Secondary | ICD-10-CM

## 2021-01-12 DIAGNOSIS — E1165 Type 2 diabetes mellitus with hyperglycemia: Secondary | ICD-10-CM | POA: Diagnosis present

## 2021-01-12 DIAGNOSIS — Z8673 Personal history of transient ischemic attack (TIA), and cerebral infarction without residual deficits: Secondary | ICD-10-CM

## 2021-01-12 DIAGNOSIS — I2510 Atherosclerotic heart disease of native coronary artery without angina pectoris: Secondary | ICD-10-CM | POA: Diagnosis present

## 2021-01-12 DIAGNOSIS — J810 Acute pulmonary edema: Secondary | ICD-10-CM | POA: Diagnosis not present

## 2021-01-12 DIAGNOSIS — R404 Transient alteration of awareness: Secondary | ICD-10-CM | POA: Diagnosis not present

## 2021-01-12 DIAGNOSIS — I214 Non-ST elevation (NSTEMI) myocardial infarction: Secondary | ICD-10-CM | POA: Diagnosis present

## 2021-01-12 DIAGNOSIS — Z885 Allergy status to narcotic agent status: Secondary | ICD-10-CM

## 2021-01-12 DIAGNOSIS — Z888 Allergy status to other drugs, medicaments and biological substances status: Secondary | ICD-10-CM | POA: Diagnosis not present

## 2021-01-12 DIAGNOSIS — R0902 Hypoxemia: Secondary | ICD-10-CM | POA: Diagnosis not present

## 2021-01-12 DIAGNOSIS — Z8711 Personal history of peptic ulcer disease: Secondary | ICD-10-CM

## 2021-01-12 DIAGNOSIS — G9340 Encephalopathy, unspecified: Secondary | ICD-10-CM | POA: Diagnosis not present

## 2021-01-12 DIAGNOSIS — E1169 Type 2 diabetes mellitus with other specified complication: Secondary | ICD-10-CM

## 2021-01-12 DIAGNOSIS — I509 Heart failure, unspecified: Secondary | ICD-10-CM | POA: Diagnosis present

## 2021-01-12 DIAGNOSIS — K760 Fatty (change of) liver, not elsewhere classified: Secondary | ICD-10-CM | POA: Diagnosis present

## 2021-01-12 DIAGNOSIS — J9600 Acute respiratory failure, unspecified whether with hypoxia or hypercapnia: Secondary | ICD-10-CM | POA: Diagnosis not present

## 2021-01-12 DIAGNOSIS — J189 Pneumonia, unspecified organism: Principal | ICD-10-CM | POA: Diagnosis present

## 2021-01-12 DIAGNOSIS — Z91040 Latex allergy status: Secondary | ICD-10-CM | POA: Diagnosis not present

## 2021-01-12 DIAGNOSIS — J440 Chronic obstructive pulmonary disease with acute lower respiratory infection: Secondary | ICD-10-CM | POA: Diagnosis present

## 2021-01-12 DIAGNOSIS — R0681 Apnea, not elsewhere classified: Secondary | ICD-10-CM | POA: Diagnosis not present

## 2021-01-12 DIAGNOSIS — E119 Type 2 diabetes mellitus without complications: Secondary | ICD-10-CM

## 2021-01-12 DIAGNOSIS — R0602 Shortness of breath: Secondary | ICD-10-CM | POA: Diagnosis not present

## 2021-01-12 DIAGNOSIS — R570 Cardiogenic shock: Secondary | ICD-10-CM | POA: Diagnosis not present

## 2021-01-12 DIAGNOSIS — J9601 Acute respiratory failure with hypoxia: Secondary | ICD-10-CM

## 2021-01-12 DIAGNOSIS — Z91011 Allergy to milk products: Secondary | ICD-10-CM

## 2021-01-12 DIAGNOSIS — F419 Anxiety disorder, unspecified: Secondary | ICD-10-CM | POA: Diagnosis present

## 2021-01-12 DIAGNOSIS — E1151 Type 2 diabetes mellitus with diabetic peripheral angiopathy without gangrene: Secondary | ICD-10-CM | POA: Diagnosis present

## 2021-01-12 DIAGNOSIS — J9602 Acute respiratory failure with hypercapnia: Secondary | ICD-10-CM | POA: Diagnosis present

## 2021-01-12 DIAGNOSIS — F17210 Nicotine dependence, cigarettes, uncomplicated: Secondary | ICD-10-CM | POA: Diagnosis present

## 2021-01-12 DIAGNOSIS — I959 Hypotension, unspecified: Secondary | ICD-10-CM | POA: Diagnosis present

## 2021-01-12 DIAGNOSIS — M797 Fibromyalgia: Secondary | ICD-10-CM | POA: Diagnosis present

## 2021-01-12 DIAGNOSIS — Z01810 Encounter for preprocedural cardiovascular examination: Secondary | ICD-10-CM | POA: Diagnosis not present

## 2021-01-12 DIAGNOSIS — I428 Other cardiomyopathies: Secondary | ICD-10-CM | POA: Diagnosis not present

## 2021-01-12 DIAGNOSIS — K589 Irritable bowel syndrome without diarrhea: Secondary | ICD-10-CM | POA: Diagnosis present

## 2021-01-12 DIAGNOSIS — R4020 Unspecified coma: Secondary | ICD-10-CM | POA: Diagnosis not present

## 2021-01-12 DIAGNOSIS — I219 Acute myocardial infarction, unspecified: Secondary | ICD-10-CM

## 2021-01-12 DIAGNOSIS — R000 Tachycardia, unspecified: Secondary | ICD-10-CM | POA: Diagnosis not present

## 2021-01-12 DIAGNOSIS — Z720 Tobacco use: Secondary | ICD-10-CM

## 2021-01-12 HISTORY — DX: Fibromyalgia: M79.7

## 2021-01-12 HISTORY — DX: Peptic ulcer, site unspecified, unspecified as acute or chronic, without hemorrhage or perforation: K27.9

## 2021-01-12 HISTORY — DX: Fatty (change of) liver, not elsewhere classified: K76.0

## 2021-01-12 HISTORY — DX: Anxiety disorder, unspecified: F41.9

## 2021-01-12 HISTORY — DX: Essential (primary) hypertension: I10

## 2021-01-12 HISTORY — DX: Depression, unspecified: F32.A

## 2021-01-12 HISTORY — DX: Diaphragmatic hernia without obstruction or gangrene: K44.9

## 2021-01-12 HISTORY — DX: Diverticulosis of intestine, part unspecified, without perforation or abscess without bleeding: K57.90

## 2021-01-12 HISTORY — DX: Peripheral vascular disease, unspecified: I73.9

## 2021-01-12 HISTORY — DX: Type 2 diabetes mellitus without complications: E11.9

## 2021-01-12 HISTORY — DX: Gastro-esophageal reflux disease without esophagitis: K21.9

## 2021-01-12 HISTORY — DX: Pure hypercholesterolemia, unspecified: E78.00

## 2021-01-12 HISTORY — DX: Irritable bowel syndrome without diarrhea: K58.9

## 2021-01-12 HISTORY — DX: Nicotine dependence, unspecified, uncomplicated: F17.200

## 2021-01-12 HISTORY — DX: Acute myocardial infarction, unspecified: I21.9

## 2021-01-12 LAB — RESP PANEL BY RT-PCR (FLU A&B, COVID) ARPGX2
Influenza A by PCR: NEGATIVE
Influenza B by PCR: NEGATIVE
SARS Coronavirus 2 by RT PCR: NEGATIVE

## 2021-01-12 LAB — COMPREHENSIVE METABOLIC PANEL
ALT: 24 U/L (ref 0–44)
AST: 39 U/L (ref 15–41)
Albumin: 3.4 g/dL — ABNORMAL LOW (ref 3.5–5.0)
Alkaline Phosphatase: 78 U/L (ref 38–126)
Anion gap: 18 — ABNORMAL HIGH (ref 5–15)
BUN: 20 mg/dL (ref 6–20)
CO2: 18 mmol/L — ABNORMAL LOW (ref 22–32)
Calcium: 9 mg/dL (ref 8.9–10.3)
Chloride: 99 mmol/L (ref 98–111)
Creatinine, Ser: 0.96 mg/dL (ref 0.44–1.00)
GFR, Estimated: >60 mL/min (ref >60)
Glucose, Bld: 446 mg/dL — ABNORMAL HIGH (ref 70–99)
Potassium: 3.6 mmol/L (ref 3.5–5.1)
Sodium: 135 mmol/L (ref 135–145)
Total Bilirubin: 0.7 mg/dL (ref 0.3–1.2)
Total Protein: 7.9 g/dL (ref 6.5–8.1)

## 2021-01-12 LAB — GLUCOSE, CAPILLARY: Glucose-Capillary: 344 mg/dL — ABNORMAL HIGH (ref 70–99)

## 2021-01-12 LAB — BLOOD GAS, ARTERIAL
Acid-base deficit: 9.6 mmol/L — ABNORMAL HIGH (ref 0.0–2.0)
Bicarbonate: 19 mmol/L — ABNORMAL LOW (ref 20.0–28.0)
FIO2: 1
MECHVT: 450 mL
O2 Saturation: 99.7 %
PEEP: 5 cmH2O
Patient temperature: 37
RATE: 20 resp/min
pCO2 arterial: 52 mmHg — ABNORMAL HIGH (ref 32.0–48.0)
pH, Arterial: 7.17 — CL (ref 7.350–7.450)
pO2, Arterial: 233 mmHg — ABNORMAL HIGH (ref 83.0–108.0)

## 2021-01-12 LAB — CBC WITH DIFFERENTIAL/PLATELET
Abs Immature Granulocytes: 0.39 10*3/uL — ABNORMAL HIGH (ref 0.00–0.07)
Basophils Absolute: 0.2 10*3/uL — ABNORMAL HIGH (ref 0.0–0.1)
Basophils Relative: 1 %
Eosinophils Absolute: 1 10*3/uL — ABNORMAL HIGH (ref 0.0–0.5)
Eosinophils Relative: 4 %
HCT: 41.1 % (ref 36.0–46.0)
Hemoglobin: 12.6 g/dL (ref 12.0–15.0)
Immature Granulocytes: 2 %
Lymphocytes Relative: 43 %
Lymphs Abs: 10.3 10*3/uL — ABNORMAL HIGH (ref 0.7–4.0)
MCH: 27.5 pg (ref 26.0–34.0)
MCHC: 30.7 g/dL (ref 30.0–36.0)
MCV: 89.7 fL (ref 80.0–100.0)
Monocytes Absolute: 1.4 10*3/uL — ABNORMAL HIGH (ref 0.1–1.0)
Monocytes Relative: 6 %
Neutro Abs: 11 10*3/uL — ABNORMAL HIGH (ref 1.7–7.7)
Neutrophils Relative %: 44 %
Platelets: 538 10*3/uL — ABNORMAL HIGH (ref 150–400)
RBC: 4.58 MIL/uL (ref 3.87–5.11)
RDW: 12.9 % (ref 11.5–15.5)
Smear Review: NORMAL
WBC: 24.2 10*3/uL — ABNORMAL HIGH (ref 4.0–10.5)
nRBC: 0 % (ref 0.0–0.2)

## 2021-01-12 LAB — PROTIME-INR
INR: 1.2 (ref 0.8–1.2)
Prothrombin Time: 15 seconds (ref 11.4–15.2)

## 2021-01-12 LAB — PHOSPHORUS: Phosphorus: 8.3 mg/dL — ABNORMAL HIGH (ref 2.5–4.6)

## 2021-01-12 LAB — MAGNESIUM: Magnesium: 2.3 mg/dL (ref 1.7–2.4)

## 2021-01-12 LAB — TROPONIN I (HIGH SENSITIVITY): Troponin I (High Sensitivity): 321 ng/L (ref <18)

## 2021-01-12 LAB — APTT: aPTT: 32 seconds (ref 24–36)

## 2021-01-12 MED ORDER — PROPOFOL 1000 MG/100ML IV EMUL: 5.0000 ug/kg/min | INTRAVENOUS | Status: DC

## 2021-01-12 MED ORDER — MIDAZOLAM HCL 2 MG/2ML IJ SOLN
2.0000 mg | INTRAMUSCULAR | Status: DC | PRN
  Administered 2021-01-12: 2 mg via INTRAVENOUS

## 2021-01-12 MED ORDER — METHYLPREDNISOLONE SODIUM SUCC 40 MG IJ SOLR
40.0000 mg | Freq: Two times a day (BID) | INTRAMUSCULAR | Status: DC
  Administered 2021-01-12: 40 mg via INTRAVENOUS
  Filled 2021-01-12 (×2): qty 1

## 2021-01-12 MED ORDER — SODIUM CHLORIDE 0.9 % IV BOLUS
1000.0000 mL | Freq: Once | INTRAVENOUS | Status: AC
  Administered 2021-01-12: 1000 mL via INTRAVENOUS

## 2021-01-12 MED ORDER — ETOMIDATE 2 MG/ML IV SOLN
INTRAVENOUS | Status: AC
  Filled 2021-01-12: qty 10

## 2021-01-12 MED ORDER — HEPARIN (PORCINE) 25000 UT/250ML-% IV SOLN
1200.0000 [IU]/h | INTRAVENOUS | Status: DC
  Administered 2021-01-13: 1200 [IU]/h via INTRAVENOUS
  Administered 2021-01-13: 950 [IU]/h via INTRAVENOUS
  Filled 2021-01-12 (×2): qty 250

## 2021-01-12 MED ORDER — INSULIN ASPART 100 UNIT/ML IJ SOLN
0.0000 [IU] | INTRAMUSCULAR | Status: DC
Start: 2021-01-13 — End: 2021-01-13
  Administered 2021-01-12: 15 [IU] via SUBCUTANEOUS
  Administered 2021-01-13 (×3): 7 [IU] via SUBCUTANEOUS
  Administered 2021-01-13: 4 [IU] via SUBCUTANEOUS
  Administered 2021-01-13: 11 [IU] via SUBCUTANEOUS
  Filled 2021-01-12 (×6): qty 1

## 2021-01-12 MED ORDER — DOPAMINE-DEXTROSE 3.2-5 MG/ML-% IV SOLN
0.0000 ug/kg/min | INTRAVENOUS | Status: DC
Start: 2021-01-12 — End: 2021-01-12
  Administered 2021-01-12: 10 ug/kg/min via INTRAVENOUS

## 2021-01-12 MED ORDER — SODIUM CHLORIDE 0.9 % IV SOLN
2.0000 g | Freq: Once | INTRAVENOUS | Status: AC
  Administered 2021-01-13: 2 g via INTRAVENOUS
  Filled 2021-01-12: qty 2

## 2021-01-12 MED ORDER — ETOMIDATE 2 MG/ML IV SOLN
INTRAVENOUS | Status: AC
  Administered 2021-01-12: 30 mg via INTRAVENOUS
  Filled 2021-01-12: qty 10

## 2021-01-12 MED ORDER — HEPARIN BOLUS VIA INFUSION
4000.0000 [IU] | Freq: Once | INTRAVENOUS | Status: AC
  Administered 2021-01-13: 4000 [IU] via INTRAVENOUS
  Filled 2021-01-12: qty 4000

## 2021-01-12 MED ORDER — POLYETHYLENE GLYCOL 3350 17 G PO PACK
17.0000 g | PACK | Freq: Every day | ORAL | Status: DC | PRN
  Filled 2021-01-12: qty 1

## 2021-01-12 MED ORDER — IPRATROPIUM-ALBUTEROL 0.5-2.5 (3) MG/3ML IN SOLN
3.0000 mL | RESPIRATORY_TRACT | Status: DC
  Administered 2021-01-12 – 2021-01-13 (×5): 3 mL via RESPIRATORY_TRACT
  Filled 2021-01-12 (×3): qty 3

## 2021-01-12 MED ORDER — VANCOMYCIN HCL IN DEXTROSE 1-5 GM/200ML-% IV SOLN
1000.0000 mg | Freq: Once | INTRAVENOUS | Status: AC
  Administered 2021-01-13: 1000 mg via INTRAVENOUS
  Filled 2021-01-12: qty 200

## 2021-01-12 MED ORDER — SUCCINYLCHOLINE CHLORIDE 200 MG/10ML IV SOSY
PREFILLED_SYRINGE | INTRAVENOUS | Status: AC
  Administered 2021-01-12: 100 mg via INTRAVENOUS
  Filled 2021-01-12: qty 10

## 2021-01-12 MED ORDER — FAMOTIDINE IN NACL 20-0.9 MG/50ML-% IV SOLN
20.0000 mg | Freq: Two times a day (BID) | INTRAVENOUS | Status: DC
  Administered 2021-01-12 – 2021-01-13 (×2): 20 mg via INTRAVENOUS
  Filled 2021-01-12 (×2): qty 50

## 2021-01-12 MED ORDER — IOHEXOL 350 MG/ML SOLN
75.0000 mL | Freq: Once | INTRAVENOUS | Status: AC | PRN
  Administered 2021-01-12: 75 mL via INTRAVENOUS

## 2021-01-12 MED ORDER — MIDAZOLAM HCL 2 MG/2ML IJ SOLN
INTRAMUSCULAR | Status: AC
  Filled 2021-01-12: qty 2

## 2021-01-12 MED ORDER — DOCUSATE SODIUM 100 MG PO CAPS
100.0000 mg | ORAL_CAPSULE | Freq: Two times a day (BID) | ORAL | Status: DC | PRN
  Administered 2021-01-12: 100 mg via ORAL
  Filled 2021-01-12: qty 1

## 2021-01-12 MED ORDER — SODIUM CHLORIDE 0.9 % IV SOLN
2.0000 g | INTRAVENOUS | Status: DC
  Administered 2021-01-13: 2 g via INTRAVENOUS
  Filled 2021-01-12 (×2): qty 20

## 2021-01-12 MED ORDER — MIDAZOLAM HCL 2 MG/2ML IJ SOLN
2.0000 mg | INTRAMUSCULAR | Status: DC | PRN
  Administered 2021-01-13: 2 mg via INTRAVENOUS
  Filled 2021-01-12: qty 2

## 2021-01-12 MED ORDER — ATROPINE SULFATE 1 MG/ML IV SOLN
1.0000 mg | Freq: Once | INTRAVENOUS | Status: AC
  Administered 2021-01-12: 1 mg via INTRAVENOUS

## 2021-01-12 MED ORDER — HEPARIN SODIUM (PORCINE) 5000 UNIT/ML IJ SOLN: 5000.0000 [IU] | Freq: Three times a day (TID) | INTRAMUSCULAR | Status: DC

## 2021-01-12 MED ORDER — FENTANYL CITRATE PF 50 MCG/ML IJ SOSY
50.0000 ug | PREFILLED_SYRINGE | INTRAMUSCULAR | Status: DC | PRN
  Administered 2021-01-12 (×2): 50 ug via INTRAVENOUS
  Filled 2021-01-12: qty 1

## 2021-01-12 MED ORDER — PROPOFOL 1000 MG/100ML IV EMUL
INTRAVENOUS | Status: AC
  Administered 2021-01-12: 10 ug/kg/min via INTRAVENOUS
  Filled 2021-01-12: qty 100

## 2021-01-12 MED ORDER — POLYETHYLENE GLYCOL 3350 17 G PO PACK
17.0000 g | PACK | Freq: Every day | ORAL | Status: DC
  Administered 2021-01-12: 17 g

## 2021-01-12 MED ORDER — DOCUSATE SODIUM 50 MG/5ML PO LIQD
100.0000 mg | Freq: Two times a day (BID) | ORAL | Status: DC
  Filled 2021-01-12 (×2): qty 10

## 2021-01-12 MED ORDER — SODIUM CHLORIDE 0.9 % IV SOLN
100.0000 mg | Freq: Two times a day (BID) | INTRAVENOUS | Status: DC
  Administered 2021-01-13: 100 mg via INTRAVENOUS
  Filled 2021-01-12 (×2): qty 100

## 2021-01-12 MED ORDER — PROPOFOL 1000 MG/100ML IV EMUL
5.0000 ug/kg/min | INTRAVENOUS | Status: DC
  Administered 2021-01-12: 10 ug/kg/min via INTRAVENOUS
  Administered 2021-01-13: 20 ug/kg/min via INTRAVENOUS
  Filled 2021-01-12: qty 100

## 2021-01-12 MED ORDER — FENTANYL CITRATE PF 50 MCG/ML IJ SOSY
50.0000 ug | PREFILLED_SYRINGE | INTRAMUSCULAR | Status: DC | PRN
  Administered 2021-01-13: 50 ug via INTRAVENOUS
  Administered 2021-01-13 (×3): 100 ug via INTRAVENOUS
  Filled 2021-01-12 (×4): qty 2

## 2021-01-12 MED ORDER — FENTANYL CITRATE PF 50 MCG/ML IJ SOSY
PREFILLED_SYRINGE | INTRAMUSCULAR | Status: AC
  Filled 2021-01-12: qty 1

## 2021-01-12 NOTE — ED Provider Notes (Signed)
[facility] Emergency Department Provider Note  ____________________________________________   I have reviewed the triage vital signs and the nursing notes.   HISTORY  Chief Complaint Respiratory Arrest   History limited by:  Unresponsive   HPI Morgan Daniels is a 60 y.o. female who presents to the emergency department today via EMS as emergency traffic because of concerns for respiratory failure.  Apparently the patient told the husband today that she was having chest pain.  She then started having a difficult time breathing and told her husband that she felt like she was going to die.  EMS states when they arrived she was having agonal breathing and was found to be significantly hypoxic.  They initially tried to put patient on CPAP however transitioned her quickly to [facility] airway.  Patient never lost pulses.   Records reviewed. Per medical record review patient has a history of PAD, carotid stenosis.    Patient Active Problem List   Diagnosis Date Noted   Acute respiratory failure ([place]) 01/12/2021    Prior to Admission medications   Not on File    Allergies Patient has no allergy information on record.  No family history on file.  Social History  Lives with husband  Review of Systems Unable to obtain secondary to unresponsiveness.  ____________________________________________   PHYSICAL EXAM:  VITAL SIGNS: ED Triage Vitals [01/12/21 2100]  Enc Vitals Group     BP (!) 46/32     Pulse Rate (!) 38     Resp 15     Temp      Temp src      SpO2 99 %   Constitutional: Unresponsive.  Eyes: Conjunctivae are normal. Pupils responsive. ENT      Head: Normocephalic and atraumatic.      Nose: No congestion/rhinnorhea.      Mouth/Throat: Mucous membranes are moist.      Neck: No stridor. Hematological/Lymphatic/Immunilogical: No cervical lymphadenopathy. Cardiovascular: Bradycardia.  Respiratory: King airway in place.  Gastrointestinal: Soft.   Genitourinary: Deferred Musculoskeletal: Normal range of motion in all extremities.  Neurologic:  Unresponsive. Pupils reactive.  Skin:  Skin is warm, dry and intact. No rash noted. ____________________________________________    LABS (pertinent positives/negatives)  CBC wbc 24.2, hgb 12.6, plt 538 VBG pH 7.17 CMP na 135, k 3.6, glu 446, cr 0.96 ____________________________________________   EKG  I, Nance Pear, attending physician, personally viewed and interpreted this EKG  EKG Time: 2059 Rate: 46 Rhythm: sinus bradycardia Axis: normal Intervals: qtc 413 QRS: RBBB ST changes: st elevation avr, st depression v2-b6 Impression: abnormal ekg   ____________________________________________    RADIOLOGY  CXR ET tube in good position. Concern for diffuse pulmonary edema  CT angio PE No PE. Concern for bilateral edema and multifocal pneumonia.  ____________________________________________   PROCEDURES  Procedures  CRITICAL CARE Performed by: Nance Pear   Total critical care time: 40 minutes  Critical care time was exclusive of separately billable procedures and treating other patients.  Critical care was necessary to treat or prevent imminent or life-threatening deterioration.  Critical care was time spent personally by me on the following activities: development of treatment plan with patient and/or surrogate as well as nursing, discussions with consultants, evaluation of patient's response to treatment, examination of patient, obtaining history from patient or surrogate, ordering and performing treatments and interventions, ordering and review of laboratory studies, ordering and review of radiographic studies, pulse oximetry and re-evaluation of patient's condition.  INTUBATION Performed by: Nance Pear  Required  items: required blood products, implants, devices, and special equipment available Patient identity confirmed: provided demographic  data and hospital-assigned identification number Time out: Immediately prior to procedure a "time out" was called to verify the correct patient, procedure, equipment, support staff and site/side marked as required.  Indications: respiratory failure  Intubation method: Glidescope Laryngoscopy   Preoxygenation: Edison Pace airway  Sedatives: Etomidate Paralytic: Succinylcholine  Tube Size: 7.5 cuffed  Post-procedure assessment: chest rise and ETCO2 monitor Tube secured with: ETT holder Chest x-ray interpreted by radiologist and me.  Chest x-ray findings: endotracheal tube in appropriate position  Patient tolerated the procedure well with no immediate complications.    ____________________________________________   INITIAL IMPRESSION / ASSESSMENT AND PLAN / ED COURSE  Pertinent labs & imaging results that were available during my care of the patient were reviewed by me and considered in my medical decision making (see chart for details).   Patient presents because of concern for respiratory failure. Patient had king airway placed by EMS. Upon arrival to the Ed king airway was replaced with ET tube. Patient's oxygen level did improve after intubation. Patient was bradycardic and was given atropine. EKG did show some ST depressions, discussed case with Dr. Saralyn Pilar with cardiology who reviewed EKGs. At this time unclear etiology of the patient's respiratory issues and did not think patient met STEMI criteria or would benefit from emergent catheterization. Did have concern for possible PE given significant hypoxia so CT angio was obtained. This did not show PE, was concerning for pna. Patient will be admitted to the ICU.   ___________________________________________   FINAL CLINICAL IMPRESSION(S) / ED DIAGNOSES  Final diagnoses:  Acute respiratory failure with hypoxia [facility])     Note: This dictation was prepared with Dragon dictation. Any transcriptional errors that result from this  process are unintentional     Nance Pear, MD 01/12/21 2300

## 2021-01-12 NOTE — ED Triage Notes (Signed)
Pt arrives in resp arrest, bagged by ems. Pt bradycardic on arrival. Pt intubated by ed md, see notes.

## 2021-01-12 NOTE — ED Notes (Signed)
Narrator: per ems pt stated she thought she was going to die, became shob, attempted to place husband's cpap herself. Per ems pt was agonal on their arrival, fsbs 477, and no known history per ems.

## 2021-01-12 NOTE — ED Notes (Signed)
Narrator as follows: 2058, hr 59, bagged resps 16 2059 etomidate 30mg  iv 2059 100mg  succy iv 2100, hr 30, intubated 7.5 22 at lips 2101, hr 34 2102 atropine 1mg  iv 2102, blood pressure 46/32, hr 63, sats 59% 2103 hr 70,  2105 hr 145, pox 88%, dopamine started 2105- pox 95% 2109- og inserted by rachael, rn

## 2021-01-12 NOTE — H&P (Addendum)
NAME:  Morgan Daniels, MRN:  [id number], DOB:  10/13/1960, LOS: 0 ADMISSION DATE:  01/12/2021, CONSULTATION DATE: 01/12/2021 REFERRING MD: Dr. Archie Balboa, CHIEF COMPLAINT: Respiratory failure  History of Present Illness:  60 year old female presenting at [facility] ED on 01/12/2021 via EMS from home as emergency traffic due to acute respiratory failure and unresponsiveness.  Per ED documentation and EMS report the patient began having difficulty breathing and told her husband that she felt as if she was going to die.  Her husband placed her on his home CPAP machine while calling EMS.  Upon EMSs arrival she was agonal he breathing and significantly hypoxic, they transitioned her to a [facility] airway for transport.  The patient never lost pulses. ED course: Upon arrival the patient was emergently intubated with mechanical ventilatory support.  Directly after she became transiently bradycardic and was given atropine followed by dopamine drip which was quickly discontinued due to stabilization of heart rate and blood pressure.  Dr. Saralyn Pilar with cardiology was consulted as EKG was showing some ST depressions in V leads.  At that time patient did not meet STEMI criteria and instead was worked up for PE versus pneumonia. Medications given: Atropine 1 mg, cefepime & vancomycin, NS IV bolus 1 L, contrast IV Initial Vitals: Afebrile 97.8, RR 15, bradycardic at 38, hypotensive 46/32 & SPO2 99% on 100% FiO2 (post atropine HR stabilized at 95 and BP 112/65) Significant labs: (Labs/ Imaging personally reviewed) I, Domingo Pulse Rust-Chester, AGACNP-BC, personally viewed and interpreted this ECG. EKG Interpretation: Date: 01/12/2021, EKG Time: 21:06, Rate: 145, Rhythm: Sinus tachycardia, QRS Axis: Normal Intervals: Prolonged QTC, bundle branch block, ST/T Wave abnormalities: ST depression in leads I & aVL, V2-V6, Narrative Interpretation: Sinus tachycardia with a bundle branch block and diffuse ST depression with a prolonged  QTC Chemistry: Na+: 135, K+: 3.6, BUN/Cr.:  20/0.96, Serum CO2/ AG: 18/18, elevated Phos 8.3, glucose 446,  beta hydroxy pending Hematology: WBC: 24.2, Hgb: 12.6,  Troponin: 32, Lactic/ PCT: Pending/<0.10,  COVID-19 & Influenza A/B: Negative UA: Pending ABG: 7.17/52/233/19 CXR 01/12/2021: Diffuse interstitial and airspace opacities suggestive of pulmonary edema, superimposed infection not excluded.  ET tube stable CT head without contrast 01/12/2021: Negative for acute intracranial abnormality CTA chest 01/12/2021: Negative for PE, pulmonary edema with probable multi lobar pneumonia, bilateral hilar adenopathy likely reactive  PCCM consulted for admission due to acute hypoxic respiratory failure requiring mechanical ventilatory support.  Pertinent  Medical History  PAD Hypercholesteremia Hypertension CVA Bilateral carotid artery stenosis Current smoker Fibromyalgia Diverticulosis CKD Fatty liver Type 2 diabetes mellitus IBS Hiatal hernia Peptic ulcer  Significant Hospital Events: Including procedures, antibiotic start and stop dates in addition to other pertinent events   01/12/2021: Patient admitted to ICU with acute hypoxic respiratory failure requiring emergent intubation and mechanical ventilatory support secondary to pneumonia versus acute heart failure.  CTA negative for PE, CTh negative.  Interim History / Subjective:  Patient intubated and sedated but responsive, will not open eyes to voice or follow commands but does shake head from side to side & moves all extremities  Objective   Blood pressure (!) 182/100, pulse (!) 139, resp. rate (!) 44, SpO2 100 %.    Vent Mode: AC FiO2 (%):  [100 %] 100 % Set Rate:  [20 bmp] 20 bmp Vt Set:  [450 mL] 450 mL PEEP:  [5 cmH20] 5 cmH20   Intake/Output Summary (Last 24 hours) at 01/12/2021 2147 Last data filed at 01/12/2021 2120 Gross per 24 hour  Intake 2 ml  Output --  Net 2 ml   There were no vitals filed for this  visit.  Examination: General: Adult female, critically ill, lying in bed intubated & sedated requiring mechanical ventilation, NAD HEENT: MM pink/moist, anicteric, atraumatic, neck supple Neuro: RASS -2, unable to follow commands, PERRL +4, MAE CV: s1s2 RRR, NSR on monitor, no r/m/g Pulm: Regular, non labored on PRVC 50% and PEEP of 5, breath sounds diffuse expiratory wheezing & coarse-BUL & diminished-BLL GI: soft, rounded, bs x 4 GU: foley in place with clear yellow urine Skin: no rashes/lesions noted Extremities: warm/dry, pulses + 2 R/P, trace edema noted BLE  Resolved Hospital Problem list     Assessment & Plan:  Acute Hypoxic / Hypercapnic Respiratory Failure secondary to pneumonia versus pulmonary edema in the setting of chronic current everyday smoking history and suspected COPD PMHx: Current everyday smoker (40-pack-year history) - Ventilator settings: PRVC  8 mL/kg, 50% FiO2, 5 PEEP, continue ventilator support & lung protective strategies - Wean PEEP & FiO2 as tolerated, maintain SpO2 > 90% - Head of bed elevated 30 degrees, VAP protocol in place - Plateau pressures less than 30 cm H20  - Intermittent chest x-ray & ABG PRN - Daily WUA with SBT as tolerated  - Ensure adequate pulmonary hygiene  - F/u cultures, trend PCT - f/u Resp. Pathogen panel, Strep Ur antigen & Legionella - Continue CAP/Aspiration Pna coverage: ceftriaxone & doxycyline (prolonged QTc) - Steroids initiated: solu-medrol 40 mg BID  - Budesonide nebs BID, scheduled Duo-nebs Q 4 h & bronchodilators PRN - PAD protocol in place: continue Fentanyl IVP & Propofol drip  Elevated Troponin secondary to N-STEMI vs demand ischemia PMHx: PAD, HTN, Hypercholesteremia, tobacco use Addendum: after interviewing the patient's husband, he described complaints of ongoing medial chest discomfort over the past ""few weeks" as well as debilitating fatigue recently to the point where she was unable to complete one task daily  such as a load of laundry >>Query new Diagnosis CHF? - Trend troponins  - Echocardiogram ordered - 40 mg Lasix for pulmonary edema - f/u BNP - heparin drip per pharmacy consult - continue home gemifibrizol - continuous cardiac monitoring - Cardiology consulted, appreciate input - will hold off on metoprolol while receiving IV sedation, consider restarting as BP & HR allow  Poorly controlled Type 2 Diabetes Mellitus Risk for Steroid Induced Hyperglycemia Hemoglobin A1C: pending - Monitor CBG Q 4 hours - SSI resistant dosing - will add 8 units of Levemir daily - target range while in ICU: 140-180 - follow ICU hyper/hypo-glycemia protocol  Best Practice (right click and "Reselect all SmartList Selections" daily)  Diet/type: NPO w/ meds via tube DVT prophylaxis: systemic heparin GI prophylaxis: H2B Lines: N/A Foley:  Yes, and it is still needed Code Status:  full code Last date of multidisciplinary goals of care discussion [01/13/2021]  Labs   CBC: Recent Labs  Lab 01/12/21 2107  WBC 24.2*  NEUTROABS 11.0*  HGB 12.6  HCT 41.1  MCV 89.7  PLT 538*    Basic Metabolic Panel: No results for input(s): NA, K, CL, CO2, GLUCOSE, BUN, CREATININE, CALCIUM, MG, PHOS in the last 168 hours. GFR: CrCl cannot be calculated (No successful lab value found.). Recent Labs  Lab 01/12/21 2107  WBC 24.2*    Liver Function Tests: No results for input(s): AST, ALT, ALKPHOS, BILITOT, PROT, ALBUMIN in the last 168 hours. No results for input(s): LIPASE, AMYLASE in the last 168 hours. No results for input(s): AMMONIA in the last 168  hours.  ABG    Component Value Date/Time   PHART 7.17 (LL) 01/12/2021 2122   PCO2ART 52 (H) 01/12/2021 2122   PO2ART 233 (H) 01/12/2021 2122   HCO3 19.0 (L) 01/12/2021 2122   ACIDBASEDEF 9.6 (H) 01/12/2021 2122   O2SAT 99.7 01/12/2021 2122     Coagulation Profile: Recent Labs  Lab 01/12/21 2107  INR 1.2    Cardiac Enzymes: No results for  input(s): CKTOTAL, CKMB, CKMBINDEX, TROPONINI in the last 168 hours.  HbA1C: No results found for: HGBA1C  CBG: No results for input(s): GLUCAP in the last 168 hours.  Review of Systems:   Patient intubated and sedated  Past Medical History:  She,  has a past medical history of Anxiety, Bilateral carotid artery stenosis (2014), Chronic kidney disease (2017), Current smoker, CVA (cerebral vascular accident) ([place]) (2013), Depression, Diverticulosis, Fatty liver, Fibromyalgia, GERD (gastroesophageal reflux disease), Hiatal hernia, Hypercholesteremia, Hypertension, IBS (irritable bowel syndrome), PAD (peripheral artery disease) ([place]), Peptic ulcer, and T2DM (type 2 diabetes mellitus) ([place]).   Surgical History:   See chart for merge  Social History:      Family History:  Her family history is under the chart set for merge   Allergies Allergies  Allergen Reactions   Simvastatin     Diarrhea, nausea,vomiting   Milk-Related Compounds     diarrhea   Morphine And Related Itching   Latex      Home Medications  Prior to Admission medications   Not on File     Critical care time: 65 minutes       Domingo Pulse Rust-[place]ster, AGACNP-BC Acute Care Nurse Practitioner [facility]   [phone] / [phone] Please see Amion for pager details.

## 2021-01-12 NOTE — Progress Notes (Signed)
ANTICOAGULATION CONSULT NOTE - Initial Consult  Pharmacy Consult for Heparin  Indication: chest pain/ACS  Not on File  Patient Measurements: Height: 5\' 6"  (167.6 cm) Weight: 91.8 kg (202 lb 6.1 oz) IBW/kg (Calculated) : 59.3 Heparin Dosing Weight: 79.4 kg   Vital Signs: BP: 125/73 (11/26 2230) Pulse Rate: 90 (11/26 2230)  Labs: Recent Labs    01/12/21 2107  HGB 12.6  HCT 41.1  PLT 538*  APTT 32  LABPROT 15.0  INR 1.2  CREATININE 0.96  TROPONINIHS 321*    Estimated Creatinine Clearance: 72 mL/min (by C-G formula based on SCr of 0.96 mg/dL).   Medical History: No past medical history on file.  Medications:  No medications prior to admission.    Assessment: Pharmacy consulted to dose heparin in this 60 year old female admitted with ACS/NSTEMI.  No prior anticoag noted.  CrCl = 72 ml/min   Goal of Therapy:  Heparin level 0.3-0.7 units/ml Monitor platelets by anticoagulation protocol: Yes   Plan:  Give 4000 units bolus x 1 Start heparin infusion at 950 units/hr Check anti-Xa level in 6 hours and daily while on heparin Continue to monitor H&H and platelets  Nadean Montanaro D 01/12/2021,11:44 PM

## 2021-01-13 ENCOUNTER — Other Ambulatory Visit: Payer: Self-pay | Admitting: Nurse Practitioner

## 2021-01-13 ENCOUNTER — Encounter
Admission: EM | Disposition: A | Discharge status: Other Acute Inpatient Hospital | Payer: Self-pay | Source: Home / Self Care | Attending: Pulmonary Disease

## 2021-01-13 ENCOUNTER — Inpatient Hospital Stay (HOSPITAL_COMMUNITY)
Admission: AD | Admit: 2021-01-13 | Discharge: 2021-01-25 | DRG: 235 | Disposition: A | Discharge status: Rehabilitation Facility | Payer: Medicare Other | Source: Other Acute Inpatient Hospital | Attending: Thoracic Surgery (Cardiothoracic Vascular Surgery) | Admitting: Thoracic Surgery (Cardiothoracic Vascular Surgery)

## 2021-01-13 ENCOUNTER — Encounter: Payer: Self-pay | Admitting: Pulmonary Disease

## 2021-01-13 ENCOUNTER — Inpatient Hospital Stay (HOSPITAL_COMMUNITY)
Admit: 2021-01-13 | Discharge: 2021-01-13 | Disposition: A | Discharge status: Home / Self Care | Payer: Medicare Other | Attending: Pulmonary Disease | Admitting: Pulmonary Disease

## 2021-01-13 DIAGNOSIS — E1169 Type 2 diabetes mellitus with other specified complication: Secondary | ICD-10-CM

## 2021-01-13 DIAGNOSIS — J810 Acute pulmonary edema: Secondary | ICD-10-CM

## 2021-01-13 DIAGNOSIS — K760 Fatty (change of) liver, not elsewhere classified: Secondary | ICD-10-CM | POA: Diagnosis present

## 2021-01-13 DIAGNOSIS — E7800 Pure hypercholesterolemia, unspecified: Secondary | ICD-10-CM | POA: Diagnosis present

## 2021-01-13 DIAGNOSIS — Z8711 Personal history of peptic ulcer disease: Secondary | ICD-10-CM | POA: Diagnosis not present

## 2021-01-13 DIAGNOSIS — J9382 Other air leak: Secondary | ICD-10-CM | POA: Diagnosis not present

## 2021-01-13 DIAGNOSIS — J9690 Respiratory failure, unspecified, unspecified whether with hypoxia or hypercapnia: Secondary | ICD-10-CM | POA: Diagnosis not present

## 2021-01-13 DIAGNOSIS — E8729 Other acidosis: Secondary | ICD-10-CM | POA: Diagnosis present

## 2021-01-13 DIAGNOSIS — E1122 Type 2 diabetes mellitus with diabetic chronic kidney disease: Secondary | ICD-10-CM | POA: Diagnosis present

## 2021-01-13 DIAGNOSIS — G9340 Encephalopathy, unspecified: Secondary | ICD-10-CM

## 2021-01-13 DIAGNOSIS — E1165 Type 2 diabetes mellitus with hyperglycemia: Secondary | ICD-10-CM | POA: Diagnosis present

## 2021-01-13 DIAGNOSIS — M797 Fibromyalgia: Secondary | ICD-10-CM | POA: Diagnosis present

## 2021-01-13 DIAGNOSIS — J9601 Acute respiratory failure with hypoxia: Secondary | ICD-10-CM

## 2021-01-13 DIAGNOSIS — Z4659 Encounter for fitting and adjustment of other gastrointestinal appliance and device: Secondary | ICD-10-CM

## 2021-01-13 DIAGNOSIS — R0902 Hypoxemia: Secondary | ICD-10-CM

## 2021-01-13 DIAGNOSIS — J9602 Acute respiratory failure with hypercapnia: Secondary | ICD-10-CM

## 2021-01-13 DIAGNOSIS — K589 Irritable bowel syndrome without diarrhea: Secondary | ICD-10-CM | POA: Diagnosis present

## 2021-01-13 DIAGNOSIS — E1151 Type 2 diabetes mellitus with diabetic peripheral angiopathy without gangrene: Secondary | ICD-10-CM | POA: Diagnosis not present

## 2021-01-13 DIAGNOSIS — R1313 Dysphagia, pharyngeal phase: Secondary | ICD-10-CM | POA: Diagnosis not present

## 2021-01-13 DIAGNOSIS — Z91040 Latex allergy status: Secondary | ICD-10-CM

## 2021-01-13 DIAGNOSIS — I5041 Acute combined systolic (congestive) and diastolic (congestive) heart failure: Secondary | ICD-10-CM | POA: Diagnosis present

## 2021-01-13 DIAGNOSIS — D6489 Other specified anemias: Secondary | ICD-10-CM | POA: Diagnosis present

## 2021-01-13 DIAGNOSIS — I10 Essential (primary) hypertension: Secondary | ICD-10-CM | POA: Diagnosis not present

## 2021-01-13 DIAGNOSIS — Z7902 Long term (current) use of antithrombotics/antiplatelets: Secondary | ICD-10-CM | POA: Diagnosis not present

## 2021-01-13 DIAGNOSIS — Z951 Presence of aortocoronary bypass graft: Secondary | ICD-10-CM | POA: Diagnosis not present

## 2021-01-13 DIAGNOSIS — R5381 Other malaise: Secondary | ICD-10-CM | POA: Diagnosis not present

## 2021-01-13 DIAGNOSIS — I429 Cardiomyopathy, unspecified: Secondary | ICD-10-CM | POA: Diagnosis present

## 2021-01-13 DIAGNOSIS — E119 Type 2 diabetes mellitus without complications: Secondary | ICD-10-CM

## 2021-01-13 DIAGNOSIS — E871 Hypo-osmolality and hyponatremia: Secondary | ICD-10-CM | POA: Diagnosis not present

## 2021-01-13 DIAGNOSIS — J449 Chronic obstructive pulmonary disease, unspecified: Secondary | ICD-10-CM | POA: Diagnosis not present

## 2021-01-13 DIAGNOSIS — J811 Chronic pulmonary edema: Secondary | ICD-10-CM

## 2021-01-13 DIAGNOSIS — E739 Lactose intolerance, unspecified: Secondary | ICD-10-CM | POA: Diagnosis present

## 2021-01-13 DIAGNOSIS — K567 Ileus, unspecified: Secondary | ICD-10-CM | POA: Diagnosis not present

## 2021-01-13 DIAGNOSIS — J154 Pneumonia due to other streptococci: Secondary | ICD-10-CM | POA: Diagnosis present

## 2021-01-13 DIAGNOSIS — I2510 Atherosclerotic heart disease of native coronary artery without angina pectoris: Secondary | ICD-10-CM | POA: Diagnosis not present

## 2021-01-13 DIAGNOSIS — J189 Pneumonia, unspecified organism: Secondary | ICD-10-CM

## 2021-01-13 DIAGNOSIS — E8770 Fluid overload, unspecified: Secondary | ICD-10-CM | POA: Diagnosis not present

## 2021-01-13 DIAGNOSIS — Z87891 Personal history of nicotine dependence: Secondary | ICD-10-CM | POA: Diagnosis not present

## 2021-01-13 DIAGNOSIS — Z8673 Personal history of transient ischemic attack (TIA), and cerebral infarction without residual deficits: Secondary | ICD-10-CM

## 2021-01-13 DIAGNOSIS — R739 Hyperglycemia, unspecified: Secondary | ICD-10-CM | POA: Diagnosis not present

## 2021-01-13 DIAGNOSIS — F17210 Nicotine dependence, cigarettes, uncomplicated: Secondary | ICD-10-CM | POA: Diagnosis not present

## 2021-01-13 DIAGNOSIS — J440 Chronic obstructive pulmonary disease with acute lower respiratory infection: Secondary | ICD-10-CM | POA: Diagnosis present

## 2021-01-13 DIAGNOSIS — R000 Tachycardia, unspecified: Secondary | ICD-10-CM | POA: Diagnosis not present

## 2021-01-13 DIAGNOSIS — Z720 Tobacco use: Secondary | ICD-10-CM

## 2021-01-13 DIAGNOSIS — K219 Gastro-esophageal reflux disease without esophagitis: Secondary | ICD-10-CM | POA: Diagnosis present

## 2021-01-13 DIAGNOSIS — F32A Depression, unspecified: Secondary | ICD-10-CM | POA: Diagnosis not present

## 2021-01-13 DIAGNOSIS — I739 Peripheral vascular disease, unspecified: Secondary | ICD-10-CM | POA: Diagnosis not present

## 2021-01-13 DIAGNOSIS — I25110 Atherosclerotic heart disease of native coronary artery with unstable angina pectoris: Secondary | ICD-10-CM | POA: Diagnosis not present

## 2021-01-13 DIAGNOSIS — I428 Other cardiomyopathies: Secondary | ICD-10-CM | POA: Diagnosis not present

## 2021-01-13 DIAGNOSIS — R319 Hematuria, unspecified: Secondary | ICD-10-CM | POA: Diagnosis not present

## 2021-01-13 DIAGNOSIS — E876 Hypokalemia: Secondary | ICD-10-CM | POA: Diagnosis present

## 2021-01-13 DIAGNOSIS — J90 Pleural effusion, not elsewhere classified: Secondary | ICD-10-CM | POA: Diagnosis not present

## 2021-01-13 DIAGNOSIS — Z95811 Presence of heart assist device: Secondary | ICD-10-CM | POA: Diagnosis not present

## 2021-01-13 DIAGNOSIS — R0602 Shortness of breath: Secondary | ICD-10-CM | POA: Diagnosis not present

## 2021-01-13 DIAGNOSIS — Z7982 Long term (current) use of aspirin: Secondary | ICD-10-CM | POA: Diagnosis not present

## 2021-01-13 DIAGNOSIS — J9600 Acute respiratory failure, unspecified whether with hypoxia or hypercapnia: Secondary | ICD-10-CM | POA: Diagnosis not present

## 2021-01-13 DIAGNOSIS — I4581 Long QT syndrome: Secondary | ICD-10-CM | POA: Diagnosis not present

## 2021-01-13 DIAGNOSIS — Z452 Encounter for adjustment and management of vascular access device: Secondary | ICD-10-CM | POA: Diagnosis not present

## 2021-01-13 DIAGNOSIS — D62 Acute posthemorrhagic anemia: Secondary | ICD-10-CM | POA: Diagnosis not present

## 2021-01-13 DIAGNOSIS — I110 Hypertensive heart disease with heart failure: Secondary | ICD-10-CM | POA: Diagnosis present

## 2021-01-13 DIAGNOSIS — R918 Other nonspecific abnormal finding of lung field: Secondary | ICD-10-CM | POA: Diagnosis not present

## 2021-01-13 DIAGNOSIS — Z0189 Encounter for other specified special examinations: Secondary | ICD-10-CM

## 2021-01-13 DIAGNOSIS — Z20822 Contact with and (suspected) exposure to covid-19: Secondary | ICD-10-CM | POA: Diagnosis not present

## 2021-01-13 DIAGNOSIS — I214 Non-ST elevation (NSTEMI) myocardial infarction: Secondary | ICD-10-CM

## 2021-01-13 DIAGNOSIS — I517 Cardiomegaly: Secondary | ICD-10-CM | POA: Diagnosis not present

## 2021-01-13 DIAGNOSIS — J9811 Atelectasis: Secondary | ICD-10-CM | POA: Diagnosis not present

## 2021-01-13 DIAGNOSIS — J9621 Acute and chronic respiratory failure with hypoxia: Secondary | ICD-10-CM | POA: Diagnosis not present

## 2021-01-13 DIAGNOSIS — Z01810 Encounter for preprocedural cardiovascular examination: Secondary | ICD-10-CM | POA: Diagnosis not present

## 2021-01-13 DIAGNOSIS — M7540 Impingement syndrome of unspecified shoulder: Secondary | ICD-10-CM

## 2021-01-13 DIAGNOSIS — J9622 Acute and chronic respiratory failure with hypercapnia: Secondary | ICD-10-CM | POA: Diagnosis not present

## 2021-01-13 DIAGNOSIS — J690 Pneumonitis due to inhalation of food and vomit: Secondary | ICD-10-CM | POA: Diagnosis not present

## 2021-01-13 DIAGNOSIS — J42 Unspecified chronic bronchitis: Secondary | ICD-10-CM | POA: Diagnosis not present

## 2021-01-13 DIAGNOSIS — Z79899 Other long term (current) drug therapy: Secondary | ICD-10-CM

## 2021-01-13 DIAGNOSIS — R1312 Dysphagia, oropharyngeal phase: Secondary | ICD-10-CM | POA: Diagnosis not present

## 2021-01-13 DIAGNOSIS — Z6830 Body mass index (BMI) 30.0-30.9, adult: Secondary | ICD-10-CM | POA: Diagnosis not present

## 2021-01-13 DIAGNOSIS — R079 Chest pain, unspecified: Secondary | ICD-10-CM | POA: Diagnosis not present

## 2021-01-13 DIAGNOSIS — Z4682 Encounter for fitting and adjustment of non-vascular catheter: Secondary | ICD-10-CM | POA: Diagnosis not present

## 2021-01-13 DIAGNOSIS — Z794 Long term (current) use of insulin: Secondary | ICD-10-CM

## 2021-01-13 DIAGNOSIS — E781 Pure hyperglyceridemia: Secondary | ICD-10-CM | POA: Diagnosis present

## 2021-01-13 DIAGNOSIS — R570 Cardiogenic shock: Secondary | ICD-10-CM | POA: Diagnosis not present

## 2021-01-13 DIAGNOSIS — E669 Obesity, unspecified: Secondary | ICD-10-CM | POA: Diagnosis not present

## 2021-01-13 DIAGNOSIS — I5021 Acute systolic (congestive) heart failure: Secondary | ICD-10-CM | POA: Diagnosis not present

## 2021-01-13 DIAGNOSIS — R569 Unspecified convulsions: Secondary | ICD-10-CM | POA: Diagnosis not present

## 2021-01-13 DIAGNOSIS — N390 Urinary tract infection, site not specified: Secondary | ICD-10-CM | POA: Diagnosis not present

## 2021-01-13 DIAGNOSIS — R0789 Other chest pain: Secondary | ICD-10-CM | POA: Diagnosis not present

## 2021-01-13 DIAGNOSIS — E782 Mixed hyperlipidemia: Secondary | ICD-10-CM | POA: Diagnosis not present

## 2021-01-13 HISTORY — PX: CORONARY/GRAFT ACUTE MI REVASCULARIZATION: CATH118305

## 2021-01-13 HISTORY — PX: LEFT HEART CATH AND CORONARY ANGIOGRAPHY: CATH118249

## 2021-01-13 HISTORY — PX: IABP INSERTION: CATH118242

## 2021-01-13 LAB — BLOOD GAS, ARTERIAL
Acid-Base Excess: 0.6 mmol/L (ref 0.0–2.0)
Bicarbonate: 26 mmol/L (ref 20.0–28.0)
FIO2: 50
MECHVT: 450 mL
Mechanical Rate: 24
O2 Saturation: 97.2 %
PEEP: 5 cmH2O
Patient temperature: 37
pCO2 arterial: 44 mmHg (ref 32.0–48.0)
pH, Arterial: 7.38 (ref 7.350–7.450)
pO2, Arterial: 94 mmHg (ref 83.0–108.0)

## 2021-01-13 LAB — ECHOCARDIOGRAM COMPLETE
AR max vel: 2.42 cm2
AV Area VTI: 2.6 cm2
AV Area mean vel: 2.39 cm2
AV Mean grad: 9 mmHg
AV Peak grad: 14.9 mmHg
Ao pk vel: 1.93 m/s
Area-P 1/2: 7.29 cm2
Height: 66 in
MV VTI: 3.45 cm2
S' Lateral: 3.8 cm
Weight: 3238.12 oz

## 2021-01-13 LAB — RESPIRATORY PANEL BY PCR

## 2021-01-13 LAB — CBC
HCT: 36.6 % (ref 36.0–46.0)
Hemoglobin: 12.1 g/dL (ref 12.0–15.0)
MCH: 27.6 pg (ref 26.0–34.0)
MCHC: 33.1 g/dL (ref 30.0–36.0)
MCV: 83.4 fL (ref 80.0–100.0)
Platelets: 434 10*3/uL — ABNORMAL HIGH (ref 150–400)
RBC: 4.39 MIL/uL (ref 3.87–5.11)
RDW: 12.9 % (ref 11.5–15.5)
WBC: 19.9 10*3/uL — ABNORMAL HIGH (ref 4.0–10.5)
nRBC: 0 % (ref 0.0–0.2)

## 2021-01-13 LAB — STREP PNEUMONIAE URINARY ANTIGEN: Strep Pneumo Urinary Antigen: NEGATIVE

## 2021-01-13 LAB — BASIC METABOLIC PANEL
Anion gap: 8 (ref 5–15)
BUN: 23 mg/dL — ABNORMAL HIGH (ref 6–20)
CO2: 26 mmol/L (ref 22–32)
Calcium: 8.7 mg/dL — ABNORMAL LOW (ref 8.9–10.3)
Chloride: 102 mmol/L (ref 98–111)
Creatinine, Ser: 0.65 mg/dL (ref 0.44–1.00)
GFR, Estimated: >60 mL/min (ref >60)
Glucose, Bld: 289 mg/dL — ABNORMAL HIGH (ref 70–99)
Potassium: 4.2 mmol/L (ref 3.5–5.1)
Sodium: 136 mmol/L (ref 135–145)

## 2021-01-13 LAB — HEPARIN LEVEL (UNFRACTIONATED)
Heparin Unfractionated: <0.1 IU/mL — ABNORMAL LOW (ref 0.30–0.70)
Heparin Unfractionated: <0.1 IU/mL — ABNORMAL LOW (ref 0.30–0.70)

## 2021-01-13 LAB — GLUCOSE, CAPILLARY
Glucose-Capillary: 290 mg/dL — ABNORMAL HIGH (ref 70–99)
Glucose-Capillary: 246 mg/dL — ABNORMAL HIGH (ref 70–99)
Glucose-Capillary: 222 mg/dL — ABNORMAL HIGH (ref 70–99)
Glucose-Capillary: 223 mg/dL — ABNORMAL HIGH (ref 70–99)
Glucose-Capillary: 194 mg/dL — ABNORMAL HIGH (ref 70–99)

## 2021-01-13 LAB — TROPONIN I (HIGH SENSITIVITY)
Troponin I (High Sensitivity): 1152 ng/L (ref <18)
Troponin I (High Sensitivity): 1790 ng/L (ref <18)
Troponin I (High Sensitivity): 2500 ng/L (ref <18)
Troponin I (High Sensitivity): 2587 ng/L (ref <18)
Troponin I (High Sensitivity): 2772 ng/L (ref <18)
Troponin I (High Sensitivity): 2913 ng/L (ref <18)

## 2021-01-13 LAB — URINALYSIS, COMPLETE (UACMP) WITH MICROSCOPIC
Bacteria, UA: NONE SEEN
Bilirubin Urine: NEGATIVE
Glucose, UA: 500 mg/dL — AB
Ketones, ur: NEGATIVE mg/dL
Leukocytes,Ua: NEGATIVE
Nitrite: NEGATIVE
Protein, ur: 30 mg/dL — AB
Specific Gravity, Urine: 1.015 (ref 1.005–1.030)
pH: 5.5 (ref 5.0–8.0)

## 2021-01-13 LAB — PHOSPHORUS: Phosphorus: 2.9 mg/dL (ref 2.5–4.6)

## 2021-01-13 LAB — MRSA NEXT GEN BY PCR, NASAL: MRSA by PCR Next Gen: NOT DETECTED

## 2021-01-13 LAB — PROCALCITONIN
Procalcitonin: <0.1 ng/mL
Procalcitonin: 5.05 ng/mL

## 2021-01-13 LAB — HIV ANTIBODY (ROUTINE TESTING W REFLEX): HIV Screen 4th Generation wRfx: NONREACTIVE

## 2021-01-13 LAB — BETA-HYDROXYBUTYRIC ACID: Beta-Hydroxybutyric Acid: 0.08 mmol/L (ref 0.05–0.27)

## 2021-01-13 LAB — BRAIN NATRIURETIC PEPTIDE: B Natriuretic Peptide: 527.5 pg/mL — ABNORMAL HIGH (ref 0.0–100.0)

## 2021-01-13 LAB — SEDIMENTATION RATE: Sed Rate: 52 mm/hr — ABNORMAL HIGH (ref 0–30)

## 2021-01-13 LAB — C-REACTIVE PROTEIN: CRP: 2.3 mg/dL — ABNORMAL HIGH (ref <1.0)

## 2021-01-13 LAB — LACTIC ACID, PLASMA: Lactic Acid, Venous: 1.9 mmol/L (ref 0.5–1.9)

## 2021-01-13 LAB — MAGNESIUM: Magnesium: 1.8 mg/dL (ref 1.7–2.4)

## 2021-01-13 LAB — TRIGLYCERIDES: Triglycerides: 187 mg/dL — ABNORMAL HIGH (ref <150)

## 2021-01-13 SURGERY — CORONARY/GRAFT ACUTE MI REVASCULARIZATION
Anesthesia: Moderate Sedation | Site: Groin | Laterality: Right

## 2021-01-13 MED ORDER — FENTANYL BOLUS VIA INFUSION
50.0000 ug | INTRAVENOUS | Status: DC | PRN
  Filled 2021-01-13: qty 100

## 2021-01-13 MED ORDER — FENTANYL 2500MCG IN NS 250ML (10MCG/ML) PREMIX INFUSION
INTRAVENOUS | Status: AC
  Administered 2021-01-13: 50 ug/h via INTRAVENOUS
  Filled 2021-01-13: qty 250

## 2021-01-13 MED ORDER — NICOTINE 21 MG/24HR TD PT24
21.0000 mg | MEDICATED_PATCH | Freq: Every day | TRANSDERMAL | Status: DC
  Administered 2021-01-13: 21 mg via TRANSDERMAL
  Filled 2021-01-13: qty 1

## 2021-01-13 MED ORDER — NITROGLYCERIN IN D5W 200-5 MCG/ML-% IV SOLN
0.0000 ug/min | INTRAVENOUS | Status: DC
  Administered 2021-01-13: 5 ug/min via INTRAVENOUS
  Filled 2021-01-13: qty 250

## 2021-01-13 MED ORDER — MIDAZOLAM HCL 2 MG/2ML IJ SOLN: 4.0000 mg | Freq: Once | INTRAMUSCULAR | Status: AC

## 2021-01-13 MED ORDER — LIDOCAINE HCL 1 % IJ SOLN
INTRAMUSCULAR | Status: AC
  Filled 2021-01-13: qty 20

## 2021-01-13 MED ORDER — POLYETHYLENE GLYCOL 3350 17 G PO PACK: 17.0000 g | PACK | Freq: Every day | ORAL | Status: DC | PRN

## 2021-01-13 MED ORDER — FENTANYL CITRATE (PF) 100 MCG/2ML IJ SOLN
INTRAMUSCULAR | Status: DC | PRN
  Administered 2021-01-13: 25 ug via INTRAVENOUS

## 2021-01-13 MED ORDER — LORAZEPAM 2 MG/ML IJ SOLN
INTRAMUSCULAR | Status: AC
  Administered 2021-01-13: 2 mg
  Filled 2021-01-13: qty 1

## 2021-01-13 MED ORDER — MIDAZOLAM HCL 2 MG/2ML IJ SOLN
INTRAMUSCULAR | Status: AC
  Administered 2021-01-13: 4 mg via INTRAVENOUS
  Filled 2021-01-13: qty 4

## 2021-01-13 MED ORDER — FENTANYL 2500MCG IN NS 250ML (10MCG/ML) PREMIX INFUSION: 50.0000 ug/h | INTRAVENOUS | Status: DC

## 2021-01-13 MED ORDER — ROCURONIUM BROMIDE 10 MG/ML (PF) SYRINGE
PREFILLED_SYRINGE | INTRAVENOUS | Status: AC
  Administered 2021-01-13: 40 mg via INTRAVENOUS
  Filled 2021-01-13: qty 10

## 2021-01-13 MED ORDER — METOPROLOL TARTRATE 5 MG/5ML IV SOLN
INTRAVENOUS | Status: AC
  Administered 2021-01-13: 5 mg via INTRAVENOUS
  Filled 2021-01-13: qty 5

## 2021-01-13 MED ORDER — ALUM & MAG HYDROXIDE-SIMETH 200-200-20 MG/5ML PO SUSP: 15.0000 mL | ORAL | Status: DC | PRN

## 2021-01-13 MED ORDER — HEPARIN BOLUS VIA INFUSION
2500.0000 [IU] | Freq: Once | INTRAVENOUS | Status: AC
  Administered 2021-01-13: 2500 [IU] via INTRAVENOUS
  Filled 2021-01-13: qty 2500

## 2021-01-13 MED ORDER — VERAPAMIL HCL 2.5 MG/ML IV SOLN
INTRAVENOUS | Status: AC
  Filled 2021-01-13: qty 2

## 2021-01-13 MED ORDER — DOCUSATE SODIUM 100 MG PO CAPS
100.0000 mg | ORAL_CAPSULE | Freq: Two times a day (BID) | ORAL | Status: DC | PRN
  Administered 2021-01-15: 100 mg via ORAL
  Filled 2021-01-13: qty 1

## 2021-01-13 MED ORDER — PROPOFOL 1000 MG/100ML IV EMUL
5.0000 ug/kg/min | INTRAVENOUS | Status: DC
  Administered 2021-01-13: 20 ug/kg/min via INTRAVENOUS
  Administered 2021-01-14 (×3): 50 ug/kg/min via INTRAVENOUS
  Administered 2021-01-14: 40 ug/kg/min via INTRAVENOUS
  Administered 2021-01-14 – 2021-01-15 (×3): 50 ug/kg/min via INTRAVENOUS
  Filled 2021-01-13 (×8): qty 100

## 2021-01-13 MED ORDER — METHYLPREDNISOLONE SODIUM SUCC 40 MG IJ SOLR: 40.0000 mg | INTRAMUSCULAR | Status: DC

## 2021-01-13 MED ORDER — MIDAZOLAM HCL 2 MG/2ML IJ SOLN
INTRAMUSCULAR | Status: AC
  Filled 2021-01-13: qty 2

## 2021-01-13 MED ORDER — IPRATROPIUM-ALBUTEROL 0.5-2.5 (3) MG/3ML IN SOLN: 3.0000 mL | RESPIRATORY_TRACT | Status: DC | PRN

## 2021-01-13 MED ORDER — HEPARIN (PORCINE) IN NACL 1000-0.9 UT/500ML-% IV SOLN
INTRAVENOUS | Status: DC | PRN
  Administered 2021-01-13 (×2): 500 mL

## 2021-01-13 MED ORDER — FENTANYL CITRATE (PF) 100 MCG/2ML IJ SOLN
INTRAMUSCULAR | Status: AC
  Administered 2021-01-13: 150 ug via INTRAVENOUS
  Filled 2021-01-13: qty 4

## 2021-01-13 MED ORDER — LIDOCAINE VISCOUS HCL 2 % MT SOLN: 15.0000 mL | Freq: Once | OROMUCOSAL | Status: DC

## 2021-01-13 MED ORDER — MAGNESIUM SULFATE 2 GM/50ML IV SOLN
2.0000 g | Freq: Once | INTRAVENOUS | Status: AC
  Administered 2021-01-13: 2 g via INTRAVENOUS
  Filled 2021-01-13: qty 50

## 2021-01-13 MED ORDER — HEPARIN SODIUM (PORCINE) 1000 UNIT/ML IJ SOLN
INTRAMUSCULAR | Status: DC | PRN
  Administered 2021-01-13: 5000 [IU] via INTRAVENOUS

## 2021-01-13 MED ORDER — ASPIRIN 81 MG PO CHEW
324.0000 mg | CHEWABLE_TABLET | Freq: Once | ORAL | Status: AC
  Administered 2021-01-13: 324 mg via ORAL
  Filled 2021-01-13: qty 4

## 2021-01-13 MED ORDER — HEPARIN SODIUM (PORCINE) 1000 UNIT/ML IJ SOLN
INTRAMUSCULAR | Status: AC
  Filled 2021-01-13: qty 10

## 2021-01-13 MED ORDER — IPRATROPIUM-ALBUTEROL 0.5-2.5 (3) MG/3ML IN SOLN
3.0000 mL | Freq: Four times a day (QID) | RESPIRATORY_TRACT | Status: DC
  Filled 2021-01-13: qty 3

## 2021-01-13 MED ORDER — METOPROLOL TARTRATE 25 MG PO TABS
25.0000 mg | ORAL_TABLET | Freq: Two times a day (BID) | ORAL | Status: DC
  Administered 2021-01-13: 25 mg
  Filled 2021-01-13: qty 1

## 2021-01-13 MED ORDER — ATORVASTATIN CALCIUM 80 MG PO TABS
80.0000 mg | ORAL_TABLET | Freq: Every day | ORAL | Status: DC
  Administered 2021-01-14 – 2021-01-15 (×2): 80 mg via ORAL
  Filled 2021-01-13 (×2): qty 1

## 2021-01-13 MED ORDER — ASPIRIN EC 81 MG PO TBEC
81.0000 mg | DELAYED_RELEASE_TABLET | Freq: Every day | ORAL | Status: DC
  Administered 2021-01-14 – 2021-01-15 (×2): 81 mg via ORAL
  Filled 2021-01-13 (×2): qty 1

## 2021-01-13 MED ORDER — FENTANYL CITRATE (PF) 100 MCG/2ML IJ SOLN
INTRAMUSCULAR | Status: AC
  Filled 2021-01-13: qty 2

## 2021-01-13 MED ORDER — FENTANYL CITRATE PF 50 MCG/ML IJ SOSY
50.0000 ug | PREFILLED_SYRINGE | Freq: Once | INTRAMUSCULAR | Status: AC
  Administered 2021-01-13: 50 ug via INTRAVENOUS

## 2021-01-13 MED ORDER — INSULIN DETEMIR 100 UNIT/ML ~~LOC~~ SOLN
8.0000 [IU] | Freq: Every day | SUBCUTANEOUS | Status: DC
  Administered 2021-01-13: 8 [IU] via SUBCUTANEOUS
  Filled 2021-01-13 (×2): qty 0.08

## 2021-01-13 MED ORDER — IOHEXOL 300 MG/ML  SOLN
INTRAMUSCULAR | Status: DC | PRN
  Administered 2021-01-13: 95 mL

## 2021-01-13 MED ORDER — PROPOFOL 1000 MG/100ML IV EMUL
INTRAVENOUS | Status: AC
  Administered 2021-01-13: 15 ug/kg/min via INTRAVENOUS
  Filled 2021-01-13: qty 100

## 2021-01-13 MED ORDER — FENTANYL CITRATE (PF) 100 MCG/2ML IJ SOLN: 150.0000 ug | Freq: Once | INTRAMUSCULAR | Status: AC

## 2021-01-13 MED ORDER — ASPIRIN 81 MG PO CHEW: 81.0000 mg | CHEWABLE_TABLET | Freq: Every day | ORAL | Status: DC

## 2021-01-13 MED ORDER — HEPARIN (PORCINE) 25000 UT/250ML-% IV SOLN
2400.0000 [IU]/h | INTRAVENOUS | Status: DC
  Administered 2021-01-13: 1200 [IU]/h via INTRAVENOUS
  Administered 2021-01-14: 1550 [IU]/h via INTRAVENOUS
  Administered 2021-01-15: 1900 [IU]/h via INTRAVENOUS
  Administered 2021-01-15: 2000 [IU]/h via INTRAVENOUS
  Administered 2021-01-16: 2100 [IU]/h via INTRAVENOUS
  Administered 2021-01-16: 2250 [IU]/h via INTRAVENOUS
  Administered 2021-01-17 – 2021-01-18 (×3): 2400 [IU]/h via INTRAVENOUS
  Filled 2021-01-13 (×8): qty 250

## 2021-01-13 MED ORDER — FUROSEMIDE 10 MG/ML IJ SOLN
40.0000 mg | Freq: Once | INTRAMUSCULAR | Status: AC
  Administered 2021-01-13: 40 mg via INTRAVENOUS
  Filled 2021-01-13: qty 4

## 2021-01-13 MED ORDER — HEPARIN (PORCINE) IN NACL 1000-0.9 UT/500ML-% IV SOLN
INTRAVENOUS | Status: AC
  Filled 2021-01-13: qty 1000

## 2021-01-13 MED ORDER — CHLORHEXIDINE GLUCONATE 0.12% ORAL RINSE (MEDLINE KIT)
15.0000 mL | Freq: Two times a day (BID) | OROMUCOSAL | Status: DC
  Administered 2021-01-13: 15 mL via OROMUCOSAL

## 2021-01-13 MED ORDER — ALUM & MAG HYDROXIDE-SIMETH 200-200-20 MG/5ML PO SUSP: 30.0000 mL | ORAL | Status: DC | PRN

## 2021-01-13 MED ORDER — ORAL CARE MOUTH RINSE: 15.0000 mL | OROMUCOSAL | Status: DC

## 2021-01-13 MED ORDER — DIPHENHYDRAMINE HCL 50 MG/ML IJ SOLN
25.0000 mg | Freq: Three times a day (TID) | INTRAMUSCULAR | Status: DC
  Administered 2021-01-13: 25 mg via INTRAVENOUS
  Filled 2021-01-13: qty 1

## 2021-01-13 MED ORDER — METOPROLOL TARTRATE 5 MG/5ML IV SOLN: 5.0000 mg | Freq: Once | INTRAVENOUS | Status: AC

## 2021-01-13 MED ORDER — VERAPAMIL HCL 2.5 MG/ML IV SOLN
INTRAVENOUS | Status: DC | PRN
  Administered 2021-01-13: 2.5 mg via INTRAVENOUS

## 2021-01-13 MED ORDER — LIDOCAINE HCL (PF) 1 % IJ SOLN
INTRAMUSCULAR | Status: DC | PRN
  Administered 2021-01-13: 2 mL

## 2021-01-13 MED ORDER — PROPOFOL 1000 MG/100ML IV EMUL: 0.0000 ug/kg/min | INTRAVENOUS | Status: DC

## 2021-01-13 MED ORDER — BUDESONIDE 0.25 MG/2ML IN SUSP
0.2500 mg | Freq: Two times a day (BID) | RESPIRATORY_TRACT | Status: DC
  Administered 2021-01-13: 0.25 mg via RESPIRATORY_TRACT
  Filled 2021-01-13: qty 2

## 2021-01-13 MED ORDER — CHLORHEXIDINE GLUCONATE 0.12% ORAL RINSE (MEDLINE KIT): 15.0000 mL | Freq: Two times a day (BID) | OROMUCOSAL | Status: DC

## 2021-01-13 MED ORDER — NITROGLYCERIN IN D5W 200-5 MCG/ML-% IV SOLN
0.0000 ug/min | INTRAVENOUS | Status: DC
  Administered 2021-01-13: 10 ug/min via INTRAVENOUS
  Administered 2021-01-14: 50 ug/min via INTRAVENOUS
  Administered 2021-01-14: 100 ug/min via INTRAVENOUS
  Administered 2021-01-15: 33.333 ug/min via INTRAVENOUS
  Administered 2021-01-17: 15 ug/min via INTRAVENOUS
  Filled 2021-01-13 (×4): qty 250

## 2021-01-13 MED ORDER — CHLORHEXIDINE GLUCONATE CLOTH 2 % EX PADS: 6.0000 | MEDICATED_PAD | Freq: Every day | CUTANEOUS | Status: DC

## 2021-01-13 MED ORDER — DOCUSATE SODIUM 50 MG/5ML PO LIQD: 100.0000 mg | Freq: Two times a day (BID) | ORAL | Status: DC | PRN

## 2021-01-13 MED ORDER — HYDROCOD POLST-CPM POLST ER 10-8 MG/5ML PO SUER
5.0000 mL | Freq: Two times a day (BID) | ORAL | Status: DC
  Administered 2021-01-13: 5 mL via ORAL
  Filled 2021-01-13: qty 5

## 2021-01-13 MED ORDER — FENTANYL 2500MCG IN NS 250ML (10MCG/ML) PREMIX INFUSION
50.0000 ug/h | INTRAVENOUS | Status: DC
  Administered 2021-01-13: 100 ug/h via INTRAVENOUS
  Administered 2021-01-14: 125 ug/h via INTRAVENOUS
  Administered 2021-01-14 – 2021-01-15 (×2): 200 ug/h via INTRAVENOUS
  Filled 2021-01-13 (×3): qty 250

## 2021-01-13 MED ORDER — ROCURONIUM BROMIDE 10 MG/ML (PF) SYRINGE: 40.0000 mg | PREFILLED_SYRINGE | Freq: Once | INTRAVENOUS | Status: AC

## 2021-01-13 MED ORDER — PANTOPRAZOLE SODIUM 40 MG PO TBEC
40.0000 mg | DELAYED_RELEASE_TABLET | Freq: Two times a day (BID) | ORAL | Status: DC
  Administered 2021-01-13: 40 mg via ORAL
  Filled 2021-01-13: qty 1

## 2021-01-13 MED ORDER — HEPARIN (PORCINE) 25000 UT/250ML-% IV SOLN
INTRAVENOUS | Status: AC | PRN
  Administered 2021-01-13: 12 [IU]/kg/h via INTRAVENOUS

## 2021-01-13 MED ORDER — ORAL CARE MOUTH RINSE
15.0000 mL | OROMUCOSAL | Status: DC
  Administered 2021-01-13 (×2): 15 mL via OROMUCOSAL

## 2021-01-13 SURGICAL SUPPLY — 21 items
BALLN IABP SENSA PLUS 8F 50CC (BALLOONS) ×3
BALLOON IABP SENS PLUS 8F 50CC (BALLOONS) IMPLANT
CATH INFINITI 5FR ANG PIGTAIL (CATHETERS) ×1 IMPLANT
CATH INFINITI JR4 5F (CATHETERS) ×1 IMPLANT
CATH VISTA GUIDE 6FR JL3.5 (CATHETERS) ×1 IMPLANT
DEVICE RAD TR BAND REGULAR (VASCULAR PRODUCTS) ×1 IMPLANT
DRAPE BRACHIAL (DRAPES) ×1 IMPLANT
GLIDESHEATH SLEND SS 6F .021 (SHEATH) ×1 IMPLANT
GUIDEWIRE INQWIRE 1.5J.035X260 (WIRE) IMPLANT
INQWIRE 1.5J .035X260CM (WIRE) ×3
KIT ENCORE 26 ADVANTAGE (KITS) IMPLANT
NDL PERC 18GX7CM (NEEDLE) IMPLANT
NEEDLE PERC 18GX7CM (NEEDLE) ×3 IMPLANT
PACK CARDIAC CATH (CUSTOM PROCEDURE TRAY) ×3 IMPLANT
PROTECTION STATION PRESSURIZED (MISCELLANEOUS) ×3
SET ATX SIMPLICITY (MISCELLANEOUS) ×1 IMPLANT
SHEATH AVANTI 5FR X 11CM (SHEATH) ×1 IMPLANT
STATION PROTECTION PRESSURIZED (MISCELLANEOUS) IMPLANT
SUT SILK 0 FSL (SUTURE) ×1 IMPLANT
TUBING CIL FLEX 10 FLL-RA (TUBING) ×1 IMPLANT
WIRE GUIDERIGHT .035X150 (WIRE) ×1 IMPLANT

## 2021-01-13 NOTE — Progress Notes (Signed)
NAME:  Morgan Daniels, MRN:  [id number], DOB:  Mar 18, 1960, LOS: 1 ADMISSION DATE:  01/12/2021, CONSULTATION DATE: 01/12/2021 REFERRING MD: Dr. Archie Balboa, CHIEF COMPLAINT: Respiratory failure  History of Present Illness:  60 year old female presenting at [facility] ED on 01/12/2021 via EMS from home as emergency traffic due to acute respiratory failure and unresponsiveness.  Per ED documentation and EMS report the patient began having difficulty breathing and told her husband that she felt as if she was going to die.  Her husband placed her on his home CPAP machine while calling EMS.  Upon EMSs arrival she was agonal he breathing and significantly hypoxic, they transitioned her to a [facility] airway for transport.  The patient never lost pulses.  01/13/21- patient is improved, was able to extubate today. Reviewed medical plan with cardiology - plan for LHC in am. Reviewed with family at bedside.  Pertinent  Medical History  PAD Hypercholesteremia Hypertension CVA Bilateral carotid artery stenosis Current smoker Fibromyalgia Diverticulosis CKD Fatty liver Type 2 diabetes mellitus IBS Hiatal hernia Peptic ulcer  Significant Hospital Events: Including procedures, antibiotic start and stop dates in addition to other pertinent events   01/12/2021: Patient admitted to ICU with acute hypoxic respiratory failure requiring emergent intubation and mechanical ventilatory support secondary to pneumonia versus acute heart failure.  CTA negative for PE, CTh negative.  Interim History / Subjective:  Patient intubated and sedated but responsive, will not open eyes to voice or follow commands but does shake head from side to side & moves all extremities  Objective   Blood pressure 138/87, pulse 94, temperature 99.5 F (37.5 C), temperature source Oral, resp. rate 17, height 5\' 6"  (1.676 m), weight 91.8 kg, SpO2 95 %.    Vent Mode: PSV FiO2 (%):  [30 %-100 %] 30 % Set Rate:  [20 bmp-24 bmp] 20 bmp Vt Set:   [450 mL] 450 mL PEEP:  [5 cmH20] 5 cmH20 Pressure Support:  [5 cmH20] 5 cmH20 Plateau Pressure:  [24 cmH20] 24 cmH20   Intake/Output Summary (Last 24 hours) at 01/13/2021 1139 Last data filed at 01/13/2021 1112 Gross per 24 hour  Intake 544.88 ml  Output 2450 ml  Net -1905.12 ml    Filed Weights   01/12/21 2300 01/13/21 0445  Weight: 91.8 kg 91.8 kg    Examination: General: Adult female, critically ill, lying in bed intubated & sedated requiring mechanical ventilation, NAD HEENT: MM pink/moist, anicteric, atraumatic, neck supple Neuro: RASS -2, unable to follow commands, PERRL +4, MAE CV: s1s2 RRR, NSR on monitor, no r/m/g Pulm: Regular, non labored on PRVC 50% and PEEP of 5, breath sounds diffuse expiratory wheezing & coarse-BUL & diminished-BLL GI: soft, rounded, bs x 4 GU: foley in place with clear yellow urine Skin: no rashes/lesions noted Extremities: warm/dry, pulses + 2 R/P, trace edema noted BLE  Resolved Hospital Problem list     Assessment & Plan:  Acute Hypoxic / Hypercapnic Respiratory Failure            -present on admission due to multifocal pneumonia with possible aspiration  -patient has been extubated -01/13/21 -Vanco/Cefepime narrowed to doxycycline /rocephin -steroids weaned to 40 solumedrol daily with plan to taper with pred in am BPH - IS/Flutter  PT/OT -RVP is negative  N-STEMI  -PMHx: PAD, HTN, Hypercholesteremia, tobacco use -s/p cardio eval - lHC in am - Dr Quentin Ore appreciate input  COPD chronic tobacco abuse   - wean steroids with prednisone    - encourage use of flutter valve and  IS    - nicotine transdermal    - Typical generaic COPD care path please    Poorly controlled Type 2 Diabetes Mellitus Risk for Steroid Induced Hyperglycemia Hemoglobin A1C: pending - Monitor CBG Q 4 hours - SSI resistant dosing - will add 8 units of Levemir daily - target range while in ICU: 140-180 - follow ICU hyper/hypo-glycemia protocol  Best  Practice (right click and "Reselect all SmartList Selections" daily)  Diet/type: NPO w/ meds via tube DVT prophylaxis: systemic heparin GI prophylaxis: H2B Lines: N/A Foley:  Yes, and it is still needed Code Status:  full code Last date of multidisciplinary goals of care discussion [01/13/2021]  Labs   CBC: Recent Labs  Lab 01/12/21 2107 01/13/21 0402  WBC 24.2* 19.9*  NEUTROABS 11.0*  --   HGB 12.6 12.1  HCT 41.1 36.6  MCV 89.7 83.4  PLT 538* 434*     Basic Metabolic Panel: Recent Labs  Lab 01/12/21 2107 01/13/21 0402  NA 135 136  K 3.6 4.2  CL 99 102  CO2 18* 26  GLUCOSE 446* 289*  BUN 20 23*  CREATININE 0.96 0.65  CALCIUM 9.0 8.7*  MG 2.3 1.8  PHOS 8.3* 2.9   GFR: Estimated Creatinine Clearance: 86.4 mL/min (by C-G formula based on SCr of 0.65 mg/dL). Recent Labs  Lab 01/12/21 2107 01/12/21 2344 01/13/21 0402  PROCALCITON <0.10  --  5.05  WBC 24.2*  --  19.9*  LATICACIDVEN  --  1.9  --      Liver Function Tests: Recent Labs  Lab 01/12/21 2107  AST 39  ALT 24  ALKPHOS 78  BILITOT 0.7  PROT 7.9  ALBUMIN 3.4*   No results for input(s): LIPASE, AMYLASE in the last 168 hours. No results for input(s): AMMONIA in the last 168 hours.  ABG    Component Value Date/Time   PHART 7.38 01/12/2021 2353   PCO2ART 44 01/12/2021 2353   PO2ART 94 01/12/2021 2353   HCO3 26.0 01/12/2021 2353   ACIDBASEDEF 9.6 (H) 01/12/2021 2122   O2SAT 97.2 01/12/2021 2353      Coagulation Profile: Recent Labs  Lab 01/12/21 2107  INR 1.2     Cardiac Enzymes: No results for input(s): CKTOTAL, CKMB, CKMBINDEX, TROPONINI in the last 168 hours.  HbA1C: No results found for: HGBA1C  CBG: Recent Labs  Lab 01/12/21 2304 01/13/21 0332 01/13/21 0740 01/13/21 1121  GLUCAP 344* 290* 246* 222*    Review of Systems:   Patient intubated and sedated  Past Medical History:  She,  has a past medical history of Anxiety, Bilateral carotid artery stenosis (2014),  Chronic kidney disease (2017), Current smoker, CVA (cerebral vascular accident) ([place]) (2013), Depression, Diverticulosis, Fatty liver, Fibromyalgia, GERD (gastroesophageal reflux disease), Hiatal hernia, Hypercholesteremia, Hypertension, IBS (irritable bowel syndrome), PAD (peripheral artery disease) ([place]), Peptic ulcer, and T2DM (type 2 diabetes mellitus) ([place]).   Surgical History:   See chart for merge  Social History:      Family History:  Her family history is under the chart set for merge   Allergies Allergies  Allergen Reactions   Simvastatin     Diarrhea, nausea,vomiting   Milk-Related Compounds     diarrhea   Morphine And Related Itching   Latex      Home Medications  Prior to Admission medications   Not on File     Critical care provider statement:   Total critical care time: 33 minutes   Performed by: Lanney Gins MD  Critical care time was exclusive of separately billable procedures and treating other patients.   Critical care was necessary to treat or prevent imminent or life-threatening deterioration.   Critical care was time spent personally by me on the following activities: development of treatment plan with patient and/or surrogate as well as nursing, discussions with consultants, evaluation of patient's response to treatment, examination of patient, obtaining history from patient or surrogate, ordering and performing treatments and interventions, ordering and review of laboratory studies, ordering and review of radiographic studies, pulse oximetry and re-evaluation of patient's condition.    Ottie Glazier, M.D.  Pulmonary & Critical Care Medicine

## 2021-01-13 NOTE — Discharge Summary (Signed)
Physician Discharge Summary  Patient ID: Morgan Daniels MRN: [id number] DOB/AGE: [birth year] 60 y.o.  Admit date: 01/12/2021 Discharge date: 01/13/2021  Admission Diagnoses:  Discharge Diagnoses:  Principal Problem:   Acute respiratory failure with hypoxia and hypercapnia ([place]) Active Problems:   NSTEMI (non-ST elevated myocardial infarction) ([place])   T2DM (type 2 diabetes mellitus) ([place])   Hypercholesteremia   Tobacco abuse   Discharged Condition: critical  Hospital Course: 60 year old female presenting at [facility] ED on 01/12/2021 via EMS from home as emergency traffic due to acute respiratory failure and unresponsiveness.  Per ED documentation and EMS report the patient began having difficulty breathing and told her husband that she felt as if she was going to die.  Her husband placed her on his home CPAP machine while calling EMS.  Upon EMSs arrival she was agonal he breathing and significantly hypoxic, they transitioned her to a [facility] airway for transport.  The patient never lost pulses. ED course: Upon arrival the patient was emergently intubated with mechanical ventilatory support.  Directly after she became transiently bradycardic and was given atropine followed by dopamine drip which was quickly discontinued due to stabilization of heart rate and blood pressure.  Dr. Saralyn Pilar with cardiology was consulted as EKG was showing some ST depressions in V leads.  At that time patient did not meet STEMI criteria and instead was worked up for PE versus pneumonia. Medications given: Atropine 1 mg, cefepime & vancomycin, NS IV bolus 1 L, contrast IV Initial Vitals: Afebrile 97.8, RR 15, bradycardic at 38, hypotensive 46/32 & SPO2 99% on 100% FiO2 (post atropine HR stabilized at 95 and BP 112/65)   PCCM consulted for admission due to acute hypoxic respiratory failure requiring mechanical ventilatory support. 01/12/2021: Patient admitted to ICU with acute hypoxic respiratory failure requiring  emergent intubation and mechanical ventilatory support secondary to pneumonia versus acute heart failure.  CTA negative for PE, CTh negative. 01/13/21: Patient extubated during the day, cardiology planned for scheduled LHC. Patient then developed respiratory distress and was emergently re-intubated CODE STEMI called patient taken to cath lab, RCA 100% occluded > IABP placed emergently and patient transferred to [facility] emergently.    Consults: cardiology and pulmonary/intensive care  Significant Diagnostic Studies: (Labs/ Imaging personally reviewed) I, Domingo Pulse Rust-[place]ster, AGACNP-BC, personally viewed and interpreted this ECG. EKG Interpretation: Date: 01/12/2021, EKG Time: 21:06, Rate: 145, Rhythm: Sinus tachycardia, QRS Axis: Normal Intervals: Prolonged QTC, bundle branch block, ST/T Wave abnormalities: ST depression in leads I & aVL, V2-V6, Narrative Interpretation: Sinus tachycardia with a bundle branch block and diffuse ST depression with a prolonged QTC Chemistry: Na+: 135, K+: 3.6, BUN/Cr.:  20/0.96, Serum CO2/ AG: 18/18, elevated Phos 8.3, glucose 446,  beta hydroxy pending Hematology: WBC: 24.2, Hgb: 12.6,  Troponin: 32, Lactic/ PCT: Pending/<0.10,  COVID-19 & Influenza A/B: Negative UA: Pending ABG: 7.17/52/233/19 CXR 01/12/2021: Diffuse interstitial and airspace opacities suggestive of pulmonary edema, superimposed infection not excluded.  ET tube stable CT head without contrast 01/12/2021: Negative for acute intracranial abnormality CTA chest 01/12/2021: Negative for PE, pulmonary edema with probable multi lobar pneumonia, bilateral hilar adenopathy likely reactive Echocardiogram 01/13/21: LVEF 60-65%, no wall motion abnormalities, G1DD   Discharge Exam: Blood pressure (!) 166/99, pulse (!) 125, temperature 99 F (37.2 C), temperature source Axillary, resp. rate (!) 27, height 5\' 6"  (1.676 m), weight 91.8 kg, SpO2 91 %. General: Adult female, critically ill, lying in bed  intubated & sedated requiring mechanical ventilation, NAD HEENT: MM pink/moist, anicteric, atraumatic, neck supple  Neuro: intubated & sedated, RASS -1, unable to follow commands, PERRL +3, MAE CV: s1s2 RRR, ST on monitor, no r/m/g Pulm: Regular, non labored on PRVC on ventilator , breath sounds coarse-BUL & diminished-BLL GI: soft, rounded, non tender, bs x 4 GU: foley in place with clear yellow urine Skin: no rashes/lesions noted Extremities: warm/dry, pulses + 2 R/P, no edema noted   Disposition: Transferred emergently to [facility] with IABP in place from cath lab   Allergies as of 01/13/2021       Reactions   Simvastatin    Diarrhea, nausea,vomiting   Milk-related Compounds    diarrhea   Morphine And Related Itching   Latex         Medication List     TAKE these medications    clopidogrel 75 MG tablet Commonly known as: PLAVIX Take 75 mg by mouth daily.   FLUoxetine 40 MG capsule Commonly known as: PROZAC Take 80 mg by mouth daily.   gabapentin 600 MG tablet Commonly known as: NEURONTIN Take 600 mg by mouth 3 (three) times daily.   gemfibrozil 600 MG tablet Commonly known as: LOPID Take 600 mg by mouth 2 (two) times daily.   hydrochlorothiazide 12.5 MG tablet Commonly known as: HYDRODIURIL Take 12.5 mg by mouth daily.   metoprolol succinate 50 MG 24 hr tablet Commonly known as: TOPROL-XL Take 50 mg by mouth daily.   NovoLOG FlexPen 100 UNIT/ML FlexPen Generic drug: insulin aspart SMARTSIG:20-26 Unit(s) SUB-Q 3 Times Daily         Signed: Huel Cote Rust-Chester 01/13/2021, 8:35 PM Domingo Pulse Rust-[place], AGACNP-BC Acute Care Nurse Practitioner [place]   [phone] / [phone] Please see Amion for pager details.

## 2021-01-13 NOTE — Consult Note (Signed)
PHARMACY CONSULT NOTE - FOLLOW UP  Pharmacy Consult for Electrolyte Monitoring and Replacement   Recent Labs: Potassium (mmol/L)  Date Value  01/13/2021 4.2   Magnesium (mg/dL)  Date Value  01/13/2021 1.8   Calcium (mg/dL)  Date Value  01/13/2021 8.7 (L)   Albumin (g/dL)  Date Value  01/12/2021 3.4 (L)   Phosphorus (mg/dL)  Date Value  01/13/2021 2.9   Sodium (mmol/L)  Date Value  01/13/2021 136     Assessment: 60 year old female presenting at [facility] ED on 01/12/2021 via EMS from home as emergency traffic due to acute respiratory failure and unresponsiveness.    Goal of Therapy:  WNL  Plan:  Medical team ordered Mg 2 g IV x 1 F/u with AM labs.   Oswald Hillock ,PharmD Clinical Pharmacist 01/13/2021 7:48 AM

## 2021-01-13 NOTE — Progress Notes (Signed)
Patient extubated to 3L [place], Per MD request. Patient tolerated well with saturations 95% on 3L at this time.

## 2021-01-13 NOTE — Procedures (Signed)
Endotracheal Intubation: Patient required placement of an artificial airway secondary to Respiratory Failure  Consent: Emergent.   Hand washing performed prior to starting the procedure.   Medications administered for sedation prior to procedure:  Midazolam 4 mg IV,  Rocuronium 40 mg IV, Fentanyl 150 mcg IV.    A time out procedure was called and correct patient, name, & ID confirmed. Needed supplies and equipment were assembled and checked to include ETT, 10 ml syringe, Glidescope, Mac and Miller blades, suction, oxygen and bag mask valve, end tidal CO2 monitor.   Patient was positioned to align the mouth and pharynx to facilitate visualization of the glottis.   Heart rate, SpO2 and blood pressure was continuously monitored during the procedure. Pre-oxygenation was conducted prior to intubation and endotracheal tube was placed through the vocal cords into the trachea.     The artificial airway was placed under direct visualization via glidescope route using a 7.5 ETT on the first attempt.  ETT was secured at 22 cm mark.  Placement was confirmed by auscuitation of lungs with good breath sounds bilaterally and no stomach sounds.  Condensation was noted on endotracheal tube.   Pulse ox 98%.  CO2 detector in place with appropriate color change.   Complications: None .     Chest radiograph ordered and pending.   Comments: OGT placed via glidescope.   Ottie Glazier, M.D.  [facility] [place]

## 2021-01-13 NOTE — Progress Notes (Addendum)
Pt arrived via [facility] with IABP in place in right femoral artery; site benign before and after transfer. Pt intubated, with appropriate sedation infusing. Heparin and nitroglycerin also infusing. VSS. Family updated, came to see pt.

## 2021-01-13 NOTE — Progress Notes (Signed)
ANTICOAGULATION CONSULT NOTE   Pharmacy Consult for Heparin  Indication: chest pain/ACS  Allergies  Allergen Reactions   Simvastatin     Diarrhea, nausea,vomiting   Milk-Related Compounds     diarrhea   Morphine And Related Itching   Latex     Patient Measurements: Height: 5\' 6"  (167.6 cm) Weight: 91.8 kg (202 lb 6.1 oz) IBW/kg (Calculated) : 59.3 Heparin Dosing Weight: 79.4 kg   Vital Signs: Temp: 99.5 F (37.5 C) (11/27 0745) Temp Source: Oral (11/27 0745) BP: 112/65 (11/27 0800) Pulse Rate: 99 (11/27 0800)  Labs: Recent Labs    01/12/21 2107 01/12/21 2344 01/13/21 0143 01/13/21 0402 01/13/21 0823  HGB 12.6  --   --  12.1  --   HCT 41.1  --   --  36.6  --   PLT 538*  --   --  434*  --   APTT 32  --   --   --   --   LABPROT 15.0  --   --   --   --   INR 1.2  --   --   --   --   HEPARINUNFRC  --   --   --   --  <0.10*  CREATININE 0.96  --   --  0.65  --   TROPONINIHS 321* 1,152* 2,500* 2,772*  --      Estimated Creatinine Clearance: 86.4 mL/min (by C-G formula based on SCr of 0.65 mg/dL).   Medical History: Past Medical History:  Diagnosis Date   Anxiety    Bilateral carotid artery stenosis 2014   Chronic kidney disease 2017   Current smoker    CVA (cerebral vascular accident) ([place]) 2013   Depression    Diverticulosis    Fatty liver    Fibromyalgia    GERD (gastroesophageal reflux disease)    Hiatal hernia    Hypercholesteremia    Hypertension    IBS (irritable bowel syndrome)    PAD (peripheral artery disease) ([facility])    Peptic ulcer    T2DM (type 2 diabetes mellitus) ([place])     Medications:  No medications prior to admission.    Assessment: Pharmacy consulted to dose heparin in this 60 year old female admitted with ACS/NSTEMI.  No prior anticoag noted.   11/27 0823 HL < 0.1    Goal of Therapy:  Heparin level 0.3-0.7 units/ml Monitor platelets by anticoagulation protocol: Yes   Plan:  Heparin level is subtherapeutic. Heparin  infusion is still currently running per RN. Will give 2400 units x 1 bolus and increase heparin infusion to 1200 units/hr. Recheck heparin level in in 6 hours. CBC daily while on heparin.   Oswald Hillock, PharmD,  01/13/2021,9:09 AM

## 2021-01-13 NOTE — Progress Notes (Signed)
Carman Ching offered support to family in ICU waiting room. Prayer, listening, and hospitality were central to this visit. Pt's sister, husband, and daughter were present during this visit. Continued support available per on call chaplain.

## 2021-01-13 NOTE — H&P (Signed)
Cardiology Admission History and Physical:   Patient ID: Morgan Daniels MRN: [id number]; DOB: 1961/02/15   Admission date: 01/13/2021  PCP:  System, Provider Not In   [place] Providers Cardiologist:  None        Chief Complaint:  acute respiratory failure in the setting of severe 3vCAD  Patient Profile:   Morgan Daniels is a 60 y.o. female with PAD, HTN, HLD, current tobacco abuse, COPD, T2DM who is being seen 01/14/2021 for the evaluation of coronary artery disease.  History of Present Illness:   [name] is a 60 y.o. female with PAD, HTN, HLD, current tobacco abuse, COPD, T2DM who is being seen 01/13/2021 for the evaluation of severe coronary artery disease complicated by acute pulmonary edema and respiratory failure requiring intubation.   Initially was admitted 11/26 in respiratory distress with NSTEMI, stabilized and extubated -> clinically deteriorated 11/27 and required reintubation. Taken to cath lab and found severe 3v CAD with CTO RCA, 95% ostial LAD and Lcx and severe LM disease. IABP placed and transferred to [facility] for CTS eval.    Past Medical History:  Diagnosis Date   Anxiety    Bilateral carotid artery stenosis 2014   Chronic kidney disease 2017   Current smoker    CVA (cerebral vascular accident) ([employer]) 2013   Depression    Diverticulosis    Fatty liver    Fibromyalgia    GERD (gastroesophageal reflux disease)    Hiatal hernia    Hypercholesteremia    Hypertension    IBS (irritable bowel syndrome)    PAD (peripheral artery disease) ([place])    Peptic ulcer    T2DM (type 2 diabetes mellitus) ([place])        Medications Prior to Admission: Prior to Admission medications   Medication Sig Start Date End Date Taking? Authorizing Provider  clopidogrel (PLAVIX) 75 MG tablet Take 75 mg by mouth daily. 01/03/21   [provider]  FLUoxetine (PROZAC) 40 MG capsule Take 80 mg by mouth daily. 12/30/20   [provider]  gabapentin (NEURONTIN)  600 MG tablet Take 600 mg by mouth 3 (three) times daily. 12/08/20   [provider]  gemfibrozil (LOPID) 600 MG tablet Take 600 mg by mouth 2 (two) times daily. 12/20/20   [provider]  hydrochlorothiazide (HYDRODIURIL) 12.5 MG tablet Take 12.5 mg by mouth daily. 12/20/20   [provider]  metoprolol succinate (TOPROL-XL) 50 MG 24 hr tablet Take 50 mg by mouth daily. 11/04/20   [provider]  NOVOLOG FLEXPEN 100 UNIT/ML FlexPen SMARTSIG:20-26 Unit(s) SUB-Q 3 Times Daily 09/28/20   [provider]     Allergies:    Allergies  Allergen Reactions   Simvastatin     Diarrhea, nausea,vomiting   Milk-Related Compounds     diarrhea   Morphine And Related Itching   Latex     Social History:   Social History   Socioeconomic History   Marital status: Married    Spouse name: Not on file   Number of children: Not on file   Years of education: Not on file   Highest education level: Not on file  Occupational History   Not on file  Tobacco Use   Smoking status: Not on file   Smokeless tobacco: Not on file  Substance and Sexual Activity   Alcohol use: Not on file   Drug use: Not on file   Sexual activity: Not on file  Other Topics Concern   Not on file  Social History Narrative   Not on file   Social Determinants of Health   Financial Resource Strain: Not on file  Food Insecurity: Not on file  Transportation Needs: Not on file  Physical Activity: Not on file  Stress: Not on file  Social Connections: Not on file  Intimate Partner Violence: Not on file    Family History:   The patient's family history is not on file.    ROS:  Please see the history of present illness.  All other ROS reviewed and negative.     Physical Exam/Data:   Vitals:   01/13/21 2345 01/14/21 0000 01/14/21 0015 01/14/21 0030  BP: (!) 117/47 (!) 102/57  (!) 100/51  Pulse: 91 88 87 87  Resp: 15 17 20 20   Temp:  99.5 F (37.5 C)    TempSrc:  Axillary     SpO2: 97% 97% 99% 99%  Weight:        Intake/Output Summary (Last 24 hours) at 01/14/2021 0119 Last data filed at 01/14/2021 0000 Gross per 24 hour  Intake 24.89 ml  Output 130 ml  Net -105.11 ml   Last 3 Weights 01/13/2021 01/13/2021 01/12/2021  Weight (lbs) 191 lb 5.8 oz 202 lb 6.1 oz 202 lb 6.1 oz  Weight (kg) 86.8 kg 91.8 kg 91.8 kg     Body mass index is 30.89 kg/m.  General:  intubated and sedated HEENT: sedated Neck: unable to appreciate JVD Vascular: No carotid bruits; Distal pulses 2+ bilaterally   Cardiac:  normal S1, S2; RRR; no murmur  Lungs:  diminished breath sounds Abd: soft, nontender, no hepatomegaly  Ext: no edema, warm Skin: warm and dry  Neuro: sedated  Psych:  sedated   EKG:  The ECG that was done  was personally reviewed and demonstrates nonspecific ST and T wave changes  Relevant CV Studies: Echo 11/27:  1. Left ventricular ejection fraction, by estimation, is 60 to 65%. The  left ventricle has normal function. The left ventricle has no regional  wall motion abnormalities. Left ventricular diastolic parameters are  consistent with Grade I diastolic  dysfunction (impaired relaxation).   2. Right ventricular systolic function is normal. The right ventricular  size is normal. Tricuspid regurgitation signal is inadequate for assessing  PA pressure.   3. The mitral valve is normal in structure. Mild mitral valve  regurgitation. No evidence of mitral stenosis.   4. The aortic valve is normal in structure. Aortic valve regurgitation is  not visualized. Aortic valve sclerosis/calcification is present, without  any evidence of aortic stenosis.   5. The inferior vena cava is dilated in size with >50% respiratory  variability, suggesting right atrial pressure of 8 mmHg.   Laboratory Data:  High Sensitivity Troponin:   Recent Labs  Lab 01/13/21 0143 01/13/21 0402 01/13/21 1256 01/13/21 1605 01/13/21 1827  TROPONINIHS 2,500* 2,772* 2,913*  2,587* 1,790*      Chemistry Recent Labs  Lab 01/12/21 2107 01/13/21 0402  NA 135 136  K 3.6 4.2  CL 99 102  CO2 18* 26  GLUCOSE 446* 289*  BUN 20 23*  CREATININE 0.96 0.65  CALCIUM 9.0 8.7*  MG 2.3 1.8  GFRNONAA >60 >60  ANIONGAP 18* 8    Recent Labs  Lab 01/12/21 2107  PROT 7.9  ALBUMIN 3.4*  AST 39  ALT 24  ALKPHOS 78  BILITOT 0.7   Lipids  Recent Labs  Lab 01/13/21 0402  TRIG 187*   Hematology Recent Labs  Lab 01/12/21  2107 01/13/21 0402  WBC 24.2* 19.9*  RBC 4.58 4.39  HGB 12.6 12.1  HCT 41.1 36.6  MCV 89.7 83.4  MCH 27.5 27.6  MCHC 30.7 33.1  RDW 12.9 12.9  PLT 538* 434*   Thyroid No results for input(s): TSH, FREET4 in the last 168 hours. BNP Recent Labs  Lab 01/13/21 0402  BNP 527.5*    DDimer No results for input(s): DDIMER in the last 168 hours.   Radiology/Studies:  CARDIAC CATHETERIZATION  Result Date: 01/13/2021   Prox RCA lesion is 100% stenosed.   Dist LM to Ost LAD lesion is 95% stenosed.   Ost Cx lesion is 95% stenosed.   Mid Cx lesion is 70% stenosed.   Mid LAD lesion is 75% stenosed.   Mid LM to Dist LM lesion is 80% stenosed.   The left ventricular ejection fraction is 35-45% by visual estimate. 1.  Non-ST elevation myocardial infarction 2.  Three-vessel coronary artery disease with 80% distal left main, 95% stenosis ostial LAD, 95% stenosis ostial left circumflex, occluded proximal RCA 3.  Mildly reduced left ventricular function with estimated LV ejection fraction 40% with anteroapical and inferoapical hypokinesis 4.  Intra-aortic balloon pump placed Recommendations 1.  Transfer to [facility] for urgent coronary artery bypass graft surgery   ECHOCARDIOGRAM COMPLETE  Result Date: 01/13/2021    ECHOCARDIOGRAM REPORT   Patient Name:   CHESTINE BELKNAP Date of Exam: 01/13/2021 Medical Rec #:  [id number]    Height:       66.0 in Accession #:    [id number]   Weight:       202.4 lb Date of Birth:  03/29/1960    BSA:          2.010  m Patient Age:    58 years     BP:           112/65 mmHg Patient Gender: F            HR:           114 bpm. Exam Location:  [facility] Procedure: 2D Echo, Color Doppler and Cardiac Doppler Indications:     I42.9 Cardiomyopathy-unspecified  History:         Patient has no prior history of Echocardiogram examinations.                  CKD and Stroke; Risk Factors:Current Smoker, Hypertension and                  Diabetes.  Sonographer:     Charmayne Sheer Referring Phys:  [id number] BRIT[place] L [place] Diagnosing Phys: Ida Rogue MD  Sonographer Comments: Echo performed with patient supine and on artificial respirator. IMPRESSIONS  1. Left ventricular ejection fraction, by estimation, is 60 to 65%. The left ventricle has normal function. The left ventricle has no regional wall motion abnormalities. Left ventricular diastolic parameters are consistent with Grade I diastolic dysfunction (impaired relaxation).  2. Right ventricular systolic function is normal. The right ventricular size is normal. Tricuspid regurgitation signal is inadequate for assessing PA pressure.  3. The mitral valve is normal in structure. Mild mitral valve regurgitation. No evidence of mitral stenosis.  4. The aortic valve is normal in structure. Aortic valve regurgitation is not visualized. Aortic valve sclerosis/calcification is present, without any evidence of aortic stenosis.  5. The inferior vena cava is dilated in size with >50% respiratory variability, suggesting right atrial pressure of 8 mmHg. FINDINGS  Left Ventricle: Left ventricular ejection  fraction, by estimation, is 60 to 65%. The left ventricle has normal function. The left ventricle has no regional wall motion abnormalities. The left ventricular internal cavity size was normal in size. There is  borderline left ventricular hypertrophy. Left ventricular diastolic parameters are consistent with Grade I diastolic dysfunction (impaired relaxation). Right Ventricle: The right ventricular  size is normal. No increase in right ventricular wall thickness. Right ventricular systolic function is normal. Tricuspid regurgitation signal is inadequate for assessing PA pressure. Left Atrium: Left atrial size was normal in size. Right Atrium: Right atrial size was normal in size. Pericardium: There is no evidence of pericardial effusion. Mitral Valve: The mitral valve is normal in structure. Mild mitral valve regurgitation. No evidence of mitral valve stenosis. MV peak gradient, 9.1 mmHg. The mean mitral valve gradient is 5.0 mmHg. Tricuspid Valve: The tricuspid valve is normal in structure. Tricuspid valve regurgitation is mild . No evidence of tricuspid stenosis. Aortic Valve: The aortic valve is normal in structure. Aortic valve regurgitation is not visualized. Aortic valve sclerosis/calcification is present, without any evidence of aortic stenosis. Aortic valve mean gradient measures 9.0 mmHg. Aortic valve peak  gradient measures 14.9 mmHg. Aortic valve area, by VTI measures 2.60 cm. Pulmonic Valve: The pulmonic valve was normal in structure. Pulmonic valve regurgitation is not visualized. No evidence of pulmonic stenosis. Aorta: The aortic root is normal in size and structure. Venous: The inferior vena cava is dilated in size with greater than 50% respiratory variability, suggesting right atrial pressure of 8 mmHg. IAS/Shunts: No atrial level shunt detected by color flow Doppler.  LEFT VENTRICLE PLAX 2D LVIDd:         5.40 cm   Diastology LVIDs:         3.80 cm   LV e' medial:    6.20 cm/s LV PW:         1.10 cm   LV E/e' medial:  17.4 LV IVS:        0.90 cm   LV e' lateral:   5.22 cm/s LVOT diam:     2.20 cm   LV E/e' lateral: 20.7 LV SV:         85 LV SV Index:   42 LVOT Area:     3.80 cm  RIGHT VENTRICLE RV Basal diam:  2.70 cm RV S prime:     22.60 cm/s LEFT ATRIUM           Index        RIGHT ATRIUM           Index LA diam:      4.20 cm 2.09 cm/m   RA Area:     13.80 cm LA Vol (A2C): 37.6 ml 18.71  ml/m  RA Volume:   33.90 ml  16.87 ml/m LA Vol (A4C): 37.3 ml 18.56 ml/m  AORTIC VALVE                     PULMONIC VALVE AV Area (Vmax):    2.42 cm      PV Vmax:       1.74 m/s AV Area (Vmean):   2.39 cm      PV Vmean:      111.000 cm/s AV Area (VTI):     2.60 cm      PV VTI:        0.281 m AV Vmax:           193.00 cm/s   PV Peak grad:  12.1 mmHg AV Vmean:          146.000 cm/s  PV Mean grad:  6.0 mmHg AV VTI:            0.327 m AV Peak Grad:      14.9 mmHg AV Mean Grad:      9.0 mmHg LVOT Vmax:         123.00 cm/s LVOT Vmean:        91.900 cm/s LVOT VTI:          0.224 m LVOT/AV VTI ratio: 0.69  AORTA Ao Root diam: 3.20 cm MITRAL VALVE MV Area (PHT): 7.29 cm     SHUNTS MV Area VTI:   3.45 cm     Systemic VTI:  0.22 m MV Peak grad:  9.1 mmHg     Systemic Diam: 2.20 cm MV Mean grad:  5.0 mmHg MV Vmax:       1.51 m/s MV Vmean:      105.0 cm/s MV Decel Time: 104 msec MV E velocity: 108.00 cm/s MV A velocity: 121.00 cm/s MV E/A ratio:  0.89 Ida Rogue MD Electronically signed by Ida Rogue MD Signature Date/Time: 01/13/2021/1:42:11 PM    Final      Assessment and Plan:   Severe 3v CAD with NSTEMI c/b respiratory failure and hemodynamic instability requiring IABP. IABP in place. On aspirin. LHC 11/27 with severe 3 vessel disease. Will consult CT surgery for evaluation. Ordered high dose statin. Critical care medicine team will be consulted for ventilator assistance. Wean as able.    Risk Assessment/Risk Scores:    TIMI Risk Score for Unstable Angina or Non-ST Elevation MI:   The patient's TIMI risk score is 6, which indicates a 41% risk of all cause mortality, new or recurrent myocardial infarction or need for urgent revascularization in the next 14 days.       Severity of Illness: The appropriate patient status for this patient is INPATIENT. Inpatient status is judged to be reasonable and necessary in order to provide the required intensity of service to ensure the patient's safety.  The patient's presenting symptoms, physical exam findings, and initial radiographic and laboratory data in the context of their chronic comorbidities is felt to place them at high risk for further clinical deterioration. Furthermore, it is not anticipated that the patient will be medically stable for discharge from the hospital within 2 midnights of admission.   * I certify that at the point of admission it is my clinical judgment that the patient will require inpatient hospital care spanning beyond 2 midnights from the point of admission due to high intensity of service, high risk for further deterioration and high frequency of surveillance required.*   For questions or updates, please contact [place] Please consult [email or web address] for contact info under     Signed, Doyne Keel, MD  01/14/2021 1:19 AM

## 2021-01-13 NOTE — Progress Notes (Signed)
Patient transported on vent to cath lab for procedure. Remained with patient throughout procedure. Care turned over to [facility] for transport to [facility]. Vent settings verified with transport team.

## 2021-01-13 NOTE — Progress Notes (Signed)
CRITICAL CARE   - Patient suddenly deteriorated at 640pm.  She is poorly responsive.  She is in respiratory distress, not improved with neb tx delivered emergently. EKG done with new changes worst in anterior precordial leads. Troponins drawn.  Family at bedside , will emergently intubate due to acute deterioration with AMS/hypoxemic failure.    Ottie Glazier, M.D.  Pulmonary & [place]

## 2021-01-13 NOTE — Progress Notes (Signed)
*  PRELIMINARY RESULTS* Echocardiogram 2D Echocardiogram has been performed.  Morgan Daniels Morgan Daniels 01/13/2021, 8:50 AM

## 2021-01-13 NOTE — Progress Notes (Addendum)
Pt is intubated

## 2021-01-13 NOTE — Progress Notes (Signed)
Cardiology Update 7:07 PM  Called that patient experiencing worsening respiratory difficulty.  ECG shows new ST elevations in I, aVL and reciprocal changes in the anterior precordium. She has been intubated/sedated.  I suspect she is having flash pulmonary edema now with her NSTEMI.  I have discussed the case with Dr Saralyn Pilar who will take her to the cath lab for emergent coronary angiography.  ICU team aware.  Lysbeth Galas T. Quentin Ore, MD, [facility], [facility] Cardiac Electrophysiology

## 2021-01-13 NOTE — Consult Note (Signed)
Cardiology Consultation:   Patient ID: Morgan Daniels MRN: [id number]; DOB: 11/22/1960  Admit date: 01/12/2021 Date of Consult: 01/13/2021  PCP:  System, Provider Not In   [facility] Providers Cardiologist:  None }     Patient Profile:   Morgan Daniels is a 59 y.o. female with a hx of tobacco abuse, peripheral vascular disease post revascularization of the left lower extremity and carotid arteries, CKD, diabetes, stroke who is being seen 01/13/2021 for the evaluation of NSTEMI at the request of Dr. Lanney Gins.  History of Present Illness:   Morgan Daniels presented to the hospital yesterday with respiratory failure and unresponsiveness.  She was intubated and improved on the ventilator.  She tells me that this for the last 48 hours she is experienced intermittent tightness in the chest and shortness of breath.  Is been difficult for her to tease out respiratory troubles versus GI symptoms given she always feels like she is holding onto gas in her abdomen.  She tells me she has never been told that she had coronary artery disease.  When she presented to the hospital this admission she had a CT chest which showed three-vessel coronary artery disease.  Troponins trended up significantly to greater than 2000.  This morning, she is chest pain-free.  Her respiratory difficulty has resolved.  She has been extubated.   Past Medical History:  Diagnosis Date   Anxiety    Bilateral carotid artery stenosis 2014   Chronic kidney disease 2017   Current smoker    CVA (cerebral vascular accident) ([place]) 2013   Depression    Diverticulosis    Fatty liver    Fibromyalgia    GERD (gastroesophageal reflux disease)    Hiatal hernia    Hypercholesteremia    Hypertension    IBS (irritable bowel syndrome)    PAD (peripheral artery disease) ([facility])    Peptic ulcer    T2DM (type 2 diabetes mellitus) ([place])          Inpatient Medications: Scheduled Meds:  budesonide (PULMICORT) nebulizer solution   0.25 mg Nebulization BID   heparin  2,500 Units Intravenous Once   insulin aspart  0-20 Units Subcutaneous Q4H   insulin detemir  8 Units Subcutaneous Daily   ipratropium-albuterol  3 mL Nebulization Q4H   methylPREDNISolone (SOLU-MEDROL) injection  40 mg Intravenous Q12H   metoprolol tartrate  25 mg Per Tube BID   Continuous Infusions:  cefTRIAXone (ROCEPHIN)  IV 2 g (01/13/21 0523)   doxycycline (VIBRAMYCIN) IV     famotidine (PEPCID) IV 20 mg (01/13/21 0812)   heparin 950 Units/hr (01/13/21 0014)   PRN Meds: docusate, fentaNYL (SUBLIMAZE) injection, fentaNYL (SUBLIMAZE) injection, polyethylene glycol  Allergies:    Allergies  Allergen Reactions   Simvastatin     Diarrhea, nausea,vomiting   Milk-Related Compounds     diarrhea   Morphine And Related Itching   Latex     Social History:   Social History   Socioeconomic History   Marital status: Married    Spouse name: Not on file   Number of children: Not on file   Years of education: Not on file   Highest education level: Not on file  Occupational History   Not on file  Tobacco Use   Smoking status: Not on file   Smokeless tobacco: Not on file  Substance and Sexual Activity   Alcohol use: Not on file   Drug use: Not on file   Sexual activity: Not on file  Other Topics  Concern   Not on file  Social History Narrative   Not on file   Social Determinants of Health   Financial Resource Strain: Not on file  Food Insecurity: Not on file  Transportation Needs: Not on file  Physical Activity: Not on file  Stress: Not on file  Social Connections: Not on file  Intimate Partner Violence: Not on file    Family History:   No family history on file.   ROS:  Please see the history of present illness.   All other ROS reviewed and negative.     Physical Exam/Data:   Vitals:   01/13/21 0733 01/13/21 0745 01/13/21 0800 01/13/21 0850  BP:   112/65   Pulse: (!) 135 (!) 107 99   Resp: (!) 22 (!) 25 (!) 24   Temp:   99.5 F (37.5 C)    TempSrc:  Oral    SpO2: 95% 93% 91% 94%  Weight:      Height:        Intake/Output Summary (Last 24 hours) at 01/13/2021 1047 Last data filed at 01/13/2021 0600 Gross per 24 hour  Intake 177.02 ml  Output 2000 ml  Net -1822.98 ml   Last 3 Weights 01/13/2021 01/12/2021  Weight (lbs) 202 lb 6.1 oz 202 lb 6.1 oz  Weight (kg) 91.8 kg 91.8 kg     Body mass index is 32.67 kg/m.  General:  Well nourished, well developed, in no acute distress.  Obese HEENT: normal Neck: no JVD Vascular: No carotid bruits; Distal pulses 2+ bilaterally Cardiac:  normal S1, S2; RRR; no murmur  Lungs:  clear to auscultation bilaterally, no wheezing, rhonchi or rales  Abd: soft, nontender, no hepatomegaly  Ext: no edema Musculoskeletal:  No deformities, BUE and BLE strength normal and equal Skin: warm and dry  Neuro:  CNs 2-12 intact, no focal abnormalities noted Psych:  Normal affect   EKG:  The EKG was personally reviewed and demonstrates:   EKG #1 from yesterday shows sinus rhythm, right bundle branch block, ST depression in the anterior precordium  EKG #2 from yesterday shows what appears to be sinus tachycardia with a ventricular rate of 140-150 with similar ST depressions.  No prior EKGs available for comparison  Telemetry:  Telemetry was personally reviewed and demonstrates: Sinus rhythm, right bundle branch block  Relevant CV Studies:  Echocardiogram performed (formal read pending) I have personally reviewed the echo which shows normal LV and RV function.  The apical inferolateral and apical anterior segments appear slightly hypokinetic although visualization is difficult in this area.  Trivial pericardial effusion.  RV function is normal.  No significant valvular abnormalities.  I personally reviewed her CT PE protocol from yesterday which shows significant burden of coronary artery calcifications in all 3 vascular beds.  The LAD has calcium along its entire  length.  Laboratory Data:  High Sensitivity Troponin:   Recent Labs  Lab 01/12/21 2107 01/12/21 2344 01/13/21 0143 01/13/21 0402  TROPONINIHS 321* 1,152* 2,500* 2,772*     Chemistry Recent Labs  Lab 01/12/21 2107 01/13/21 0402  NA 135 136  K 3.6 4.2  CL 99 102  CO2 18* 26  GLUCOSE 446* 289*  BUN 20 23*  CREATININE 0.96 0.65  CALCIUM 9.0 8.7*  MG 2.3 1.8  GFRNONAA >60 >60  ANIONGAP 18* 8    Recent Labs  Lab 01/12/21 2107  PROT 7.9  ALBUMIN 3.4*  AST 39  ALT 24  ALKPHOS 78  BILITOT 0.7  Lipids  Recent Labs  Lab 01/13/21 0402  TRIG 187*    Hematology Recent Labs  Lab 01/12/21 2107 01/13/21 0402  WBC 24.2* 19.9*  RBC 4.58 4.39  HGB 12.6 12.1  HCT 41.1 36.6  MCV 89.7 83.4  MCH 27.5 27.6  MCHC 30.7 33.1  RDW 12.9 12.9  PLT 538* 434*   Thyroid No results for input(s): TSH, FREET4 in the last 168 hours.  BNP Recent Labs  Lab 01/13/21 0402  BNP 527.5*    DDimer No results for input(s): DDIMER in the last 168 hours.   Radiology/Studies:  DG Abdomen 1 View  Result Date: 01/12/2021 CLINICAL DATA:  Status post OG tube placement. EXAM: ABDOMEN - 1 VIEW COMPARISON:  September 30, 2010 FINDINGS: An orogastric tube is seen with its distal tip noted within the body of the stomach. The distal side hole sits approximately 5.3 cm distal to the expected region of the gastroesophageal junction. The bowel gas pattern is normal. Radiopaque surgical clips are noted within the right upper quadrant. No radio-opaque calculi or other significant radiographic abnormality are seen. Radiopaque pedicle screws are present within the visualized portion of the lower lumbar spine. IMPRESSION: Orogastric tube positioning, as described above. Electronically Signed   By: Virgina Norfolk M.D.   On: 01/12/2021 21:35   CT HEAD WO CONTRAST (5MM)  Result Date: 01/12/2021 CLINICAL DATA:  Mental status change.  Unknown cause EXAM: CT HEAD WITHOUT CONTRAST TECHNIQUE: Contiguous axial  images were obtained from the base of the skull through the vertex without intravenous contrast. COMPARISON:  CT head 02/03/2019 BRAIN: BRAIN Cerebral ventricle sizes are concordant with the degree of cerebral volume loss. Patchy and confluent areas of decreased attenuation are noted throughout the deep and periventricular white matter of the cerebral hemispheres bilaterally, compatible with chronic microvascular ischemic disease. No evidence of large-territorial acute infarction. No parenchymal hemorrhage. No mass lesion. No extra-axial collection. No mass effect or midline shift. No hydrocephalus. Basilar cisterns are patent. Vascular: No hyperdense vessel. Atherosclerotic calcifications are present within the cavernous internal carotid arteries. Skull: No acute fracture or focal lesion. Sinuses/Orbits: Paranasal sinuses and mastoid air cells are clear. The orbits are unremarkable. Other: None. IMPRESSION: No acute intracranial abnormality. Electronically Signed   By: Iven Finn M.D.   On: 01/12/2021 22:09   CT Angio Chest PE W and/or Wo Contrast  Result Date: 01/12/2021 CLINICAL DATA:  Chest pain and shortness of breath. EXAM: CT ANGIOGRAPHY CHEST WITH CONTRAST TECHNIQUE: Multidetector CT imaging of the chest was performed using the standard protocol during bolus administration of intravenous contrast. Multiplanar CT image reconstructions and MIPs were obtained to evaluate the vascular anatomy. CONTRAST:  22mL OMNIPAQUE IOHEXOL 350 MG/ML SOLN COMPARISON:  Chest radiograph dated 01/12/2021. FINDINGS: Evaluation of this exam is limited due to respiratory motion artifact. Cardiovascular: Borderline cardiomegaly. No pericardial effusion. Advanced 3 vessel coronary vascular calcification. Moderate atherosclerotic calcification of the thoracic aorta. No aneurysmal dilatation. Evaluation of the pulmonary arteries is very limited due to respiratory motion artifact. No large or central pulmonary artery embolus  identified. Mediastinum/Nodes: Bilateral hilar adenopathy measures 17 mm in short axis on the right. An enteric tube is noted within the esophagus. No mediastinal fluid collection. Lungs/Pleura: Diffuse interstitial and interlobular septal prominence consistent with edema. Diffuse ground-glass and nodular densities throughout the lungs with patchy areas of consolidation bilaterally and predominantly involving the lower lobes concerning for superimposed pneumonia. Clinical correlation is recommended. Small bilateral pleural effusions. No pneumothorax. Endotracheal tube with tip 4  cm above the carina. The central airways are patent. Upper Abdomen: Probable fatty liver. Musculoskeletal: No acute osseous pathology. Review of the MIP images confirms the above findings. IMPRESSION: 1. No CT evidence of central pulmonary artery embolus. 2. Pulmonary edema with probable multilobar pneumonia. Clinical correlation is recommended. 3. Bilateral hilar adenopathy, likely reactive. 4. Aortic Atherosclerosis (ICD10-I70.0). Electronically Signed   By: Anner Crete M.D.   On: 01/12/2021 22:13   DG Chest Portable 1 View  Result Date: 01/12/2021 CLINICAL DATA:  et tube placement.  SOB EXAM: PORTABLE CHEST 1 VIEW COMPARISON:  None. FINDINGS: Endotracheal tube with tip terminating 6 cm above the carina. Enteric tube coursing below the hemidiaphragm with tip and side port collimated off view. Lines and tubes as well as cardiac paddles overlie the chest. The heart and mediastinal contours are within normal limits. Increased interstitial markings and diffuse patchy airspace opacities. No pleural effusion. No pneumothorax. No acute osseous abnormality. IMPRESSION: 1. Diffuse interstitial and airspace opacities suggestive of pulmonary edema. Superimposed infection/inflammation not excluded. 2. Endotracheal tube in good position. 3. Enteric tube coursing below the hemidiaphragm with tip and side port collimated off view.  Electronically Signed   By: Iven Finn M.D.   On: 01/12/2021 21:34     Assessment and Plan:   Ms. Bacchi is a 61 year old woman with an extensive medical history including diabetes and peripheral vascular disease who presents with respiratory failure and NSTEMI.  She is chest pain-free this morning and her respiratory status has improved significantly allowing extubation.  NSTEMI Extensive vascular disease history and coronary artery calcifications in all 3 coronary distributions on CT scan.  She will require coronary angiography with possible revascularization.  If she develops recurrent chest pain or respiratory distress, recommend discussing with interventional cardiologist for more urgent angiography.  -Continue to trend troponins until they downtrend -Continue heparin drip for ACS -Aspirin 81 mg daily -Patient with a documented statin allergy -Continue metoprolol -Please keep n.p.o. after midnight for angiography tomorrow  2.  Possible pneumonia Management per primary.   For questions or updates, please contact [place] Please consult [email or web address] for contact info under    Signed, Vickie Epley, MD  01/13/2021 10:47 AM

## 2021-01-13 NOTE — Progress Notes (Signed)
Chaplain Maggie made initial visit with pt's daughter and son in law in the ICU waiting room. Social support was central to this visit as pastoral care was introduced. Continued care available per on call chaplain.

## 2021-01-14 ENCOUNTER — Inpatient Hospital Stay (HOSPITAL_COMMUNITY): Payer: Medicare Other

## 2021-01-14 ENCOUNTER — Inpatient Hospital Stay: Payer: Self-pay

## 2021-01-14 ENCOUNTER — Encounter: Payer: Self-pay | Admitting: Cardiology

## 2021-01-14 DIAGNOSIS — E782 Mixed hyperlipidemia: Secondary | ICD-10-CM | POA: Diagnosis not present

## 2021-01-14 DIAGNOSIS — R570 Cardiogenic shock: Secondary | ICD-10-CM

## 2021-01-14 DIAGNOSIS — I739 Peripheral vascular disease, unspecified: Secondary | ICD-10-CM

## 2021-01-14 DIAGNOSIS — J189 Pneumonia, unspecified organism: Secondary | ICD-10-CM

## 2021-01-14 DIAGNOSIS — J9601 Acute respiratory failure with hypoxia: Secondary | ICD-10-CM | POA: Diagnosis not present

## 2021-01-14 DIAGNOSIS — I214 Non-ST elevation (NSTEMI) myocardial infarction: Secondary | ICD-10-CM

## 2021-01-14 DIAGNOSIS — E8770 Fluid overload, unspecified: Secondary | ICD-10-CM

## 2021-01-14 DIAGNOSIS — I25110 Atherosclerotic heart disease of native coronary artery with unstable angina pectoris: Secondary | ICD-10-CM

## 2021-01-14 LAB — CBC
HCT: 31.8 % — ABNORMAL LOW (ref 36.0–46.0)
HCT: 28.1 % — ABNORMAL LOW (ref 36.0–46.0)
Hemoglobin: 10.3 g/dL — ABNORMAL LOW (ref 12.0–15.0)
Hemoglobin: 9.1 g/dL — ABNORMAL LOW (ref 12.0–15.0)
MCH: 27.5 pg (ref 26.0–34.0)
MCH: 27.7 pg (ref 26.0–34.0)
MCHC: 32.4 g/dL (ref 30.0–36.0)
MCHC: 32.4 g/dL (ref 30.0–36.0)
MCV: 85 fL (ref 80.0–100.0)
MCV: 85.7 fL (ref 80.0–100.0)
Platelets: 296 10*3/uL (ref 150–400)
Platelets: 255 10*3/uL (ref 150–400)
RBC: 3.74 MIL/uL — ABNORMAL LOW (ref 3.87–5.11)
RBC: 3.28 MIL/uL — ABNORMAL LOW (ref 3.87–5.11)
RDW: 13.2 % (ref 11.5–15.5)
RDW: 13.2 % (ref 11.5–15.5)
WBC: 17.6 10*3/uL — ABNORMAL HIGH (ref 4.0–10.5)
WBC: 14.1 10*3/uL — ABNORMAL HIGH (ref 4.0–10.5)
nRBC: 0 % (ref 0.0–0.2)
nRBC: 0 % (ref 0.0–0.2)

## 2021-01-14 LAB — HEMOGLOBIN A1C
Hgb A1c MFr Bld: 6.6 % — ABNORMAL HIGH (ref 4.8–5.6)
Hgb A1c MFr Bld: 8.8 % — ABNORMAL HIGH (ref 4.8–5.6)
Mean Plasma Glucose: 143 mg/dL
Mean Plasma Glucose: 206 mg/dL

## 2021-01-14 LAB — POCT I-STAT 7, (LYTES, BLD GAS, ICA,H+H)
Acid-Base Excess: 1 mmol/L (ref 0.0–2.0)
Bicarbonate: 27.8 mmol/L (ref 20.0–28.0)
Calcium, Ion: 1.17 mmol/L (ref 1.15–1.40)
HCT: 32 % — ABNORMAL LOW (ref 36.0–46.0)
Hemoglobin: 10.9 g/dL — ABNORMAL LOW (ref 12.0–15.0)
O2 Saturation: 97 %
Patient temperature: 37
Potassium: 4.2 mmol/L (ref 3.5–5.1)
Sodium: 135 mmol/L (ref 135–145)
TCO2: 29 mmol/L (ref 22–32)
pCO2 arterial: 52.9 mmHg — ABNORMAL HIGH (ref 32.0–48.0)
pH, Arterial: 7.328 — ABNORMAL LOW (ref 7.350–7.450)
pO2, Arterial: 100 mmHg (ref 83.0–108.0)

## 2021-01-14 LAB — BASIC METABOLIC PANEL
Anion gap: 7 (ref 5–15)
Anion gap: 8 (ref 5–15)
BUN: 24 mg/dL — ABNORMAL HIGH (ref 6–20)
BUN: 22 mg/dL — ABNORMAL HIGH (ref 6–20)
CO2: 26 mmol/L (ref 22–32)
CO2: 26 mmol/L (ref 22–32)
Calcium: 8.3 mg/dL — ABNORMAL LOW (ref 8.9–10.3)
Calcium: 8.2 mg/dL — ABNORMAL LOW (ref 8.9–10.3)
Chloride: 101 mmol/L (ref 98–111)
Chloride: 99 mmol/L (ref 98–111)
Creatinine, Ser: 0.91 mg/dL (ref 0.44–1.00)
Creatinine, Ser: 0.94 mg/dL (ref 0.44–1.00)
GFR, Estimated: >60 mL/min (ref >60)
GFR, Estimated: >60 mL/min (ref >60)
Glucose, Bld: 216 mg/dL — ABNORMAL HIGH (ref 70–99)
Glucose, Bld: 202 mg/dL — ABNORMAL HIGH (ref 70–99)
Potassium: 4.2 mmol/L (ref 3.5–5.1)
Potassium: 4.2 mmol/L (ref 3.5–5.1)
Sodium: 134 mmol/L — ABNORMAL LOW (ref 135–145)
Sodium: 133 mmol/L — ABNORMAL LOW (ref 135–145)

## 2021-01-14 LAB — HEPARIN LEVEL (UNFRACTIONATED)
Heparin Unfractionated: <0.1 IU/mL — ABNORMAL LOW (ref 0.30–0.70)
Heparin Unfractionated: <0.1 IU/mL — ABNORMAL LOW (ref 0.30–0.70)
Heparin Unfractionated: <0.1 IU/mL — ABNORMAL LOW (ref 0.30–0.70)

## 2021-01-14 LAB — MRSA NEXT GEN BY PCR, NASAL: MRSA by PCR Next Gen: NOT DETECTED

## 2021-01-14 LAB — COOXEMETRY PANEL
Carboxyhemoglobin: 1.5 % (ref 0.5–1.5)
Methemoglobin: 1.3 % (ref 0.0–1.5)
O2 Saturation: 79.7 %
Total hemoglobin: 9.6 g/dL — ABNORMAL LOW (ref 12.0–16.0)

## 2021-01-14 LAB — GLUCOSE, CAPILLARY
Glucose-Capillary: 232 mg/dL — ABNORMAL HIGH (ref 70–99)
Glucose-Capillary: 191 mg/dL — ABNORMAL HIGH (ref 70–99)
Glucose-Capillary: 188 mg/dL — ABNORMAL HIGH (ref 70–99)
Glucose-Capillary: 209 mg/dL — ABNORMAL HIGH (ref 70–99)

## 2021-01-14 LAB — PROCALCITONIN: Procalcitonin: 8.96 ng/mL

## 2021-01-14 LAB — LEGIONELLA PNEUMOPHILA SEROGP 1 UR AG: L. pneumophila Serogp 1 Ur Ag: NEGATIVE

## 2021-01-14 LAB — MAGNESIUM
Magnesium: 2 mg/dL (ref 1.7–2.4)
Magnesium: 1.9 mg/dL (ref 1.7–2.4)
Magnesium: 2.4 mg/dL (ref 1.7–2.4)

## 2021-01-14 LAB — PHOSPHORUS: Phosphorus: 3.9 mg/dL (ref 2.5–4.6)

## 2021-01-14 MED ORDER — SODIUM CHLORIDE 0.9 % IV SOLN
100.0000 mg | Freq: Two times a day (BID) | INTRAVENOUS | Status: DC
  Administered 2021-01-14 – 2021-01-16 (×5): 100 mg via INTRAVENOUS
  Filled 2021-01-14 (×5): qty 100

## 2021-01-14 MED ORDER — SODIUM CHLORIDE 0.9% FLUSH: 10.0000 mL | INTRAVENOUS | Status: DC | PRN

## 2021-01-14 MED ORDER — FENTANYL CITRATE PF 50 MCG/ML IJ SOSY
50.0000 ug | PREFILLED_SYRINGE | INTRAMUSCULAR | Status: AC | PRN
  Administered 2021-01-14 – 2021-01-15 (×3): 50 ug via INTRAVENOUS
  Filled 2021-01-14: qty 1

## 2021-01-14 MED ORDER — CHLORHEXIDINE GLUCONATE 0.12% ORAL RINSE (MEDLINE KIT)
15.0000 mL | Freq: Two times a day (BID) | OROMUCOSAL | Status: DC
  Administered 2021-01-14 – 2021-01-16 (×5): 15 mL via OROMUCOSAL

## 2021-01-14 MED ORDER — METOPROLOL TARTRATE 12.5 MG HALF TABLET
12.5000 mg | ORAL_TABLET | Freq: Two times a day (BID) | ORAL | Status: DC
  Administered 2021-01-14 – 2021-01-15 (×4): 12.5 mg via ORAL
  Filled 2021-01-14 (×4): qty 1

## 2021-01-14 MED ORDER — ACETAMINOPHEN 160 MG/5ML PO SOLN
650.0000 mg | Freq: Four times a day (QID) | ORAL | Status: DC | PRN
  Administered 2021-01-14 – 2021-01-17 (×4): 650 mg
  Filled 2021-01-14 (×4): qty 20.3

## 2021-01-14 MED ORDER — DOCUSATE SODIUM 50 MG/5ML PO LIQD
100.0000 mg | Freq: Two times a day (BID) | ORAL | Status: DC
  Administered 2021-01-15 – 2021-01-17 (×5): 100 mg
  Filled 2021-01-14 (×5): qty 10

## 2021-01-14 MED ORDER — LABETALOL HCL 5 MG/ML IV SOLN: 10.0000 mg | INTRAVENOUS | Status: DC | PRN

## 2021-01-14 MED ORDER — INSULIN ASPART 100 UNIT/ML IJ SOLN
0.0000 [IU] | INTRAMUSCULAR | Status: DC
  Administered 2021-01-14: 5 [IU] via SUBCUTANEOUS
  Administered 2021-01-14: 3 [IU] via SUBCUTANEOUS
  Administered 2021-01-14: 5 [IU] via SUBCUTANEOUS
  Administered 2021-01-14 – 2021-01-15 (×2): 3 [IU] via SUBCUTANEOUS
  Administered 2021-01-15: 5 [IU] via SUBCUTANEOUS
  Administered 2021-01-15: 3 [IU] via SUBCUTANEOUS
  Administered 2021-01-15: 5 [IU] via SUBCUTANEOUS
  Administered 2021-01-15: 8 [IU] via SUBCUTANEOUS
  Administered 2021-01-15 – 2021-01-16 (×2): 3 [IU] via SUBCUTANEOUS
  Administered 2021-01-16: 5 [IU] via SUBCUTANEOUS

## 2021-01-14 MED ORDER — IPRATROPIUM-ALBUTEROL 0.5-2.5 (3) MG/3ML IN SOLN
3.0000 mL | Freq: Four times a day (QID) | RESPIRATORY_TRACT | Status: DC
  Administered 2021-01-14 – 2021-01-18 (×18): 3 mL via RESPIRATORY_TRACT
  Filled 2021-01-14 (×19): qty 3

## 2021-01-14 MED ORDER — ORAL CARE MOUTH RINSE
15.0000 mL | OROMUCOSAL | Status: DC
  Administered 2021-01-14 – 2021-01-16 (×20): 15 mL via OROMUCOSAL

## 2021-01-14 MED ORDER — INSULIN GLARGINE-YFGN 100 UNIT/ML ~~LOC~~ SOLN
15.0000 [IU] | Freq: Every day | SUBCUTANEOUS | Status: DC
  Administered 2021-01-14: 15 [IU] via SUBCUTANEOUS
  Filled 2021-01-14: qty 0.15

## 2021-01-14 MED ORDER — FENTANYL CITRATE PF 50 MCG/ML IJ SOSY
50.0000 ug | PREFILLED_SYRINGE | INTRAMUSCULAR | Status: DC | PRN
  Administered 2021-01-15 (×3): 50 ug via INTRAVENOUS
  Administered 2021-01-16 (×2): 100 ug via INTRAVENOUS
  Administered 2021-01-16: 50 ug via INTRAVENOUS
  Administered 2021-01-16: 100 ug via INTRAVENOUS
  Administered 2021-01-16: 50 ug via INTRAVENOUS
  Administered 2021-01-16 (×2): 100 ug via INTRAVENOUS
  Filled 2021-01-14 (×3): qty 1
  Filled 2021-01-14 (×4): qty 2
  Filled 2021-01-14: qty 1

## 2021-01-14 MED ORDER — FUROSEMIDE 10 MG/ML IJ SOLN
40.0000 mg | Freq: Three times a day (TID) | INTRAMUSCULAR | Status: AC
  Administered 2021-01-14 (×2): 40 mg via INTRAVENOUS
  Filled 2021-01-14: qty 4

## 2021-01-14 MED ORDER — POTASSIUM CHLORIDE 20 MEQ PO PACK
40.0000 meq | PACK | Freq: Once | ORAL | Status: AC
  Administered 2021-01-14: 40 meq
  Filled 2021-01-14: qty 2

## 2021-01-14 MED ORDER — CHLORHEXIDINE GLUCONATE CLOTH 2 % EX PADS
6.0000 | MEDICATED_PAD | Freq: Every day | CUTANEOUS | Status: DC
  Administered 2021-01-14 – 2021-01-18 (×4): 6 via TOPICAL

## 2021-01-14 MED ORDER — VITAL HIGH PROTEIN PO LIQD
1000.0000 mL | ORAL | Status: DC
  Administered 2021-01-14 – 2021-01-15 (×3): 1000 mL

## 2021-01-14 MED ORDER — SODIUM CHLORIDE 0.9 % IV SOLN
2.0000 g | INTRAVENOUS | Status: DC
  Administered 2021-01-14 – 2021-01-16 (×3): 2 g via INTRAVENOUS
  Filled 2021-01-14 (×3): qty 20

## 2021-01-14 MED ORDER — INSULIN GLARGINE-YFGN 100 UNIT/ML ~~LOC~~ SOLN
25.0000 [IU] | Freq: Two times a day (BID) | SUBCUTANEOUS | Status: DC
  Administered 2021-01-14 – 2021-01-17 (×7): 25 [IU] via SUBCUTANEOUS
  Filled 2021-01-14 (×9): qty 0.25

## 2021-01-14 MED ORDER — SODIUM CHLORIDE 0.9% FLUSH
10.0000 mL | Freq: Two times a day (BID) | INTRAVENOUS | Status: DC
  Administered 2021-01-14 – 2021-01-17 (×6): 10 mL

## 2021-01-14 MED ORDER — REVEFENACIN 175 MCG/3ML IN SOLN
175.0000 ug | Freq: Every day | RESPIRATORY_TRACT | Status: DC
  Administered 2021-01-14: 175 ug via RESPIRATORY_TRACT
  Filled 2021-01-14 (×2): qty 3

## 2021-01-14 MED ORDER — POLYETHYLENE GLYCOL 3350 17 G PO PACK
17.0000 g | PACK | Freq: Every day | ORAL | Status: DC
  Administered 2021-01-15 – 2021-01-16 (×2): 17 g
  Filled 2021-01-14: qty 1

## 2021-01-14 MED ORDER — MAGNESIUM SULFATE 2 GM/50ML IV SOLN
2.0000 g | Freq: Once | INTRAVENOUS | Status: AC
  Administered 2021-01-14: 2 g via INTRAVENOUS
  Filled 2021-01-14: qty 50

## 2021-01-14 MED ORDER — PANTOPRAZOLE SODIUM 40 MG IV SOLR
40.0000 mg | Freq: Every day | INTRAVENOUS | Status: DC
  Administered 2021-01-14 – 2021-01-18 (×5): 40 mg via INTRAVENOUS
  Filled 2021-01-14 (×5): qty 40

## 2021-01-14 MED ORDER — ALBUTEROL SULFATE (2.5 MG/3ML) 0.083% IN NEBU: 2.5000 mg | INHALATION_SOLUTION | RESPIRATORY_TRACT | Status: DC | PRN

## 2021-01-14 MED ORDER — INSULIN ASPART 100 UNIT/ML IJ SOLN
0.0000 [IU] | Freq: Three times a day (TID) | INTRAMUSCULAR | Status: DC
  Administered 2021-01-14: 5 [IU] via SUBCUTANEOUS

## 2021-01-14 MED ORDER — FUROSEMIDE 10 MG/ML IJ SOLN
40.0000 mg | Freq: Once | INTRAMUSCULAR | Status: AC
  Administered 2021-01-14: 40 mg via INTRAVENOUS
  Filled 2021-01-14: qty 4

## 2021-01-14 MED ORDER — ARFORMOTEROL TARTRATE 15 MCG/2ML IN NEBU
15.0000 ug | INHALATION_SOLUTION | Freq: Two times a day (BID) | RESPIRATORY_TRACT | Status: DC
  Administered 2021-01-14 – 2021-01-22 (×17): 15 ug via RESPIRATORY_TRACT
  Filled 2021-01-14 (×17): qty 2

## 2021-01-14 MED ORDER — INSULIN ASPART 100 UNIT/ML IJ SOLN
2.0000 [IU] | INTRAMUSCULAR | Status: DC
  Administered 2021-01-14 – 2021-01-15 (×6): 2 [IU] via SUBCUTANEOUS

## 2021-01-14 NOTE — Progress Notes (Signed)
ANTICOAGULATION CONSULT NOTE - Follow Up Consult  Pharmacy Consult for heparin Indication:  IABP  Labs: Recent Labs    01/12/21 2107 01/12/21 2344 01/13/21 0402 01/13/21 0823 01/13/21 1256 01/13/21 1605 01/13/21 1827 01/14/21 0153 01/14/21 0307  HGB 12.6  --  12.1  --   --   --   --  10.3* 10.9*  HCT 41.1  --  36.6  --   --   --   --  31.8* 32.0*  PLT 538*  --  434*  --   --   --   --  296  --   APTT 32  --   --   --   --   --   --   --   --   LABPROT 15.0  --   --   --   --   --   --   --   --   INR 1.2  --   --   --   --   --   --   --   --   HEPARINUNFRC  --   --   --  <0.10*  --   --  <0.10* <0.10*  --   CREATININE 0.96  --  0.65  --   --   --   --  0.91  --   TROPONINIHS 321*   < > 2,772*  --  2,913* 2,587* 1,790*  --   --    < > = values in this interval not displayed.    Assessment: 60yo female subtherapeutic on heparin after arrival from [facility] cath lab w/ IABP placement, now awaiting CVTS consult; no infusion issues or signs of bleeding per RN.  Goal of Therapy:  Heparin level 0.2-0.5 units/ml   Plan:  Will increase heparin infusion by 2-3 units/kg/hr to 1400 units/hr and check level in 6 hours.    Wynona Neat, PharmD, BCPS  01/14/2021,3:34 AM

## 2021-01-14 NOTE — Progress Notes (Addendum)
Progress Note  Patient Name: Morgan Daniels Date of Encounter: 01/14/2021  [facility] Cardiologist: None ; Dr. Saralyn Pilar at [place], sedated.  Will open her eyes.  Inpatient Medications    Scheduled Meds:  aspirin EC  81 mg Oral Daily   atorvastatin  80 mg Oral Daily   chlorhexidine gluconate (MEDLINE KIT)  15 mL Mouth Rinse BID   insulin aspart  0-15 Units Subcutaneous Q4H   ipratropium-albuterol  3 mL Nebulization Q6H   mouth rinse  15 mL Mouth Rinse 10 times per day   pantoprazole (PROTONIX) IV  40 mg Intravenous Daily   Continuous Infusions:  cefTRIAXone (ROCEPHIN)  IV 200 mL/hr at 01/14/21 0700   doxycycline (VIBRAMYCIN) IV Stopped (01/14/21 9892)   fentaNYL infusion INTRAVENOUS 125 mcg/hr (01/14/21 0747)   heparin 1,400 Units/hr (01/14/21 0700)   nitroGLYCERIN 100 mcg/min (01/14/21 0700)   propofol (DIPRIVAN) infusion 50 mcg/kg/min (01/14/21 0816)   PRN Meds: albuterol, docusate sodium, polyethylene glycol   Vital Signs    Vitals:   01/14/21 0732 01/14/21 0733 01/14/21 0742 01/14/21 0800  BP: (!) 147/67   (!) 120/52  Pulse: (!) 105   99  Resp: (!) 21   18  Temp:   99.2 F (37.3 C)   TempSrc:   Axillary   SpO2: 93% 98%  93%  Weight:        Intake/Output Summary (Last 24 hours) at 01/14/2021 0902 Last data filed at 01/14/2021 0800 Gross per 24 hour  Intake 475.42 ml  Output 415 ml  Net 60.42 ml   Last 3 Weights 01/14/2021 01/13/2021 01/13/2021  Weight (lbs) 191 lb 5.8 oz 191 lb 5.8 oz 202 lb 6.1 oz  Weight (kg) 86.8 kg 86.8 kg 91.8 kg      Telemetry    Sinus tach - Personally Reviewed  ECG      Physical Exam   GEN: No acute distress.  Intubated Neck: No JVD Cardiac: RRR, no murmurs, rubs, or gallops.  Respiratory: Coarse breath sounds to auscultation bilaterally. GI: Soft, nontender, non-distended  MS: No edema; No deformity. Right foot warm and pink, appears well perfused; no right groin hematoma Neuro:   Nonfocal  Psych: Intubated, sedated  Labs    High Sensitivity Troponin:   Recent Labs  Lab 01/13/21 0143 01/13/21 0402 01/13/21 1256 01/13/21 1605 01/13/21 1827  TROPONINIHS 2,500* 2,772* 2,913* 2,587* 1,790*     Chemistry Recent Labs  Lab 01/12/21 2107 01/13/21 0402 01/14/21 0153 01/14/21 0307  NA 135 136 134* 135  K 3.6 4.2 4.2 4.2  CL 99 102 101  --   CO2 18* 26 26  --   GLUCOSE 446* 289* 216*  --   BUN 20 23* 24*  --   CREATININE 0.96 0.65 0.91  --   CALCIUM 9.0 8.7* 8.3*  --   MG 2.3 1.8 2.0  --   PROT 7.9  --   --   --   ALBUMIN 3.4*  --   --   --   AST 39  --   --   --   ALT 24  --   --   --   ALKPHOS 78  --   --   --   BILITOT 0.7  --   --   --   GFRNONAA >60 >60 >60  --   ANIONGAP 18* 8 7  --     Lipids  Recent Labs  Lab 01/13/21 0402  TRIG  187*    Hematology Recent Labs  Lab 01/12/21 2107 01/13/21 0402 01/14/21 0153 01/14/21 0307  WBC 24.2* 19.9* 17.6*  --   RBC 4.58 4.39 3.74*  --   HGB 12.6 12.1 10.3* 10.9*  HCT 41.1 36.6 31.8* 32.0*  MCV 89.7 83.4 85.0  --   MCH 27.5 27.6 27.5  --   MCHC 30.7 33.1 32.4  --   RDW 12.9 12.9 13.2  --   PLT 538* 434* 296  --    Thyroid No results for input(s): TSH, FREET4 in the last 168 hours.  BNP Recent Labs  Lab 01/13/21 0402  BNP 527.5*    DDimer No results for input(s): DDIMER in the last 168 hours.   Radiology    DG Abdomen 1 View  Result Date: 01/12/2021 CLINICAL DATA:  Status post OG tube placement. EXAM: ABDOMEN - 1 VIEW COMPARISON:  September 30, 2010 FINDINGS: An orogastric tube is seen with its distal tip noted within the body of the stomach. The distal side hole sits approximately 5.3 cm distal to the expected region of the gastroesophageal junction. The bowel gas pattern is normal. Radiopaque surgical clips are noted within the right upper quadrant. No radio-opaque calculi or other significant radiographic abnormality are seen. Radiopaque pedicle screws are present within the visualized  portion of the lower lumbar spine. IMPRESSION: Orogastric tube positioning, as described above. Electronically Signed   By: Virgina Norfolk M.D.   On: 01/12/2021 21:35   CT HEAD WO CONTRAST (5MM)  Result Date: 01/12/2021 CLINICAL DATA:  Mental status change.  Unknown cause EXAM: CT HEAD WITHOUT CONTRAST TECHNIQUE: Contiguous axial images were obtained from the base of the skull through the vertex without intravenous contrast. COMPARISON:  CT head 02/03/2019 BRAIN: BRAIN Cerebral ventricle sizes are concordant with the degree of cerebral volume loss. Patchy and confluent areas of decreased attenuation are noted throughout the deep and periventricular white matter of the cerebral hemispheres bilaterally, compatible with chronic microvascular ischemic disease. No evidence of large-territorial acute infarction. No parenchymal hemorrhage. No mass lesion. No extra-axial collection. No mass effect or midline shift. No hydrocephalus. Basilar cisterns are patent. Vascular: No hyperdense vessel. Atherosclerotic calcifications are present within the cavernous internal carotid arteries. Skull: No acute fracture or focal lesion. Sinuses/Orbits: Paranasal sinuses and mastoid air cells are clear. The orbits are unremarkable. Other: None. IMPRESSION: No acute intracranial abnormality. Electronically Signed   By: Iven Finn M.D.   On: 01/12/2021 22:09   CT Angio Chest PE W and/or Wo Contrast  Result Date: 01/12/2021 CLINICAL DATA:  Chest pain and shortness of breath. EXAM: CT ANGIOGRAPHY CHEST WITH CONTRAST TECHNIQUE: Multidetector CT imaging of the chest was performed using the standard protocol during bolus administration of intravenous contrast. Multiplanar CT image reconstructions and MIPs were obtained to evaluate the vascular anatomy. CONTRAST:  8m OMNIPAQUE IOHEXOL 350 MG/ML SOLN COMPARISON:  Chest radiograph dated 01/12/2021. FINDINGS: Evaluation of this exam is limited due to respiratory motion artifact.  Cardiovascular: Borderline cardiomegaly. No pericardial effusion. Advanced 3 vessel coronary vascular calcification. Moderate atherosclerotic calcification of the thoracic aorta. No aneurysmal dilatation. Evaluation of the pulmonary arteries is very limited due to respiratory motion artifact. No large or central pulmonary artery embolus identified. Mediastinum/Nodes: Bilateral hilar adenopathy measures 17 mm in short axis on the right. An enteric tube is noted within the esophagus. No mediastinal fluid collection. Lungs/Pleura: Diffuse interstitial and interlobular septal prominence consistent with edema. Diffuse ground-glass and nodular densities throughout the lungs with patchy areas  of consolidation bilaterally and predominantly involving the lower lobes concerning for superimposed pneumonia. Clinical correlation is recommended. Small bilateral pleural effusions. No pneumothorax. Endotracheal tube with tip 4 cm above the carina. The central airways are patent. Upper Abdomen: Probable fatty liver. Musculoskeletal: No acute osseous pathology. Review of the MIP images confirms the above findings. IMPRESSION: 1. No CT evidence of central pulmonary artery embolus. 2. Pulmonary edema with probable multilobar pneumonia. Clinical correlation is recommended. 3. Bilateral hilar adenopathy, likely reactive. 4. Aortic Atherosclerosis (ICD10-I70.0). Electronically Signed   By: Anner Crete M.D.   On: 01/12/2021 22:13   CARDIAC CATHETERIZATION  Result Date: 01/13/2021   Prox RCA lesion is 100% stenosed.   Dist LM to Ost LAD lesion is 95% stenosed.   Ost Cx lesion is 95% stenosed.   Mid Cx lesion is 70% stenosed.   Mid LAD lesion is 75% stenosed.   Mid LM to Dist LM lesion is 80% stenosed.   The left ventricular ejection fraction is 35-45% by visual estimate. 1.  Non-ST elevation myocardial infarction 2.  Three-vessel coronary artery disease with 80% distal left main, 95% stenosis ostial LAD, 95% stenosis ostial left  circumflex, occluded proximal RCA 3.  Mildly reduced left ventricular function with estimated LV ejection fraction 40% with anteroapical and inferoapical hypokinesis 4.  Intra-aortic balloon pump placed Recommendations 1.  Transfer to [facility] for urgent coronary artery bypass graft surgery   DG CHEST PORT 1 VIEW  Result Date: 01/14/2021 CLINICAL DATA:  Initial evaluation for acute respiratory failure with hypoxia. EXAM: PORTABLE CHEST 1 VIEW COMPARISON:  Radiograph from 01/12/2021 FINDINGS: Endotracheal tube in place with tip at the superior margin of the clavicles. Diffuse related pad overlies the left chest. Stable cardiomegaly. Mediastinal silhouette within normal limits. Lungs mildly hypoinflated. Persistent bilateral interstitial and airspace disease, most pronounced at the right lung base. Overall appearance is improved, suspected to reflect improved edema. Probable small bilateral pleural effusions. No pneumothorax. Osseous structures are unchanged. IMPRESSION: 1. Tip of the endotracheal tube at the superior margin of the clavicles. 2. Persistent bilateral airspace disease, most pronounced at the right lung base. Overall appearance is mildly improved from prior, favored to reflect improved edema. Superimposed infection/infiltrates not excluded. 3. Probable small bilateral pleural effusions. Electronically Signed   By: Jeannine Boga M.D.   On: 01/14/2021 02:37   DG Chest Portable 1 View  Result Date: 01/12/2021 CLINICAL DATA:  et tube placement.  SOB EXAM: PORTABLE CHEST 1 VIEW COMPARISON:  None. FINDINGS: Endotracheal tube with tip terminating 6 cm above the carina. Enteric tube coursing below the hemidiaphragm with tip and side port collimated off view. Lines and tubes as well as cardiac paddles overlie the chest. The heart and mediastinal contours are within normal limits. Increased interstitial markings and diffuse patchy airspace opacities. No pleural effusion. No  pneumothorax. No acute osseous abnormality. IMPRESSION: 1. Diffuse interstitial and airspace opacities suggestive of pulmonary edema. Superimposed infection/inflammation not excluded. 2. Endotracheal tube in good position. 3. Enteric tube coursing below the hemidiaphragm with tip and side port collimated off view. Electronically Signed   By: Iven Finn M.D.   On: 01/12/2021 21:34   DG Abd Portable 1V  Result Date: 01/14/2021 CLINICAL DATA:  Encounter for orogastric tube EXAM: PORTABLE ABDOMEN - 1 VIEW COMPARISON:  Chest CT 2 days ago FINDINGS: Enteric tube with tip and side port at the stomach. Artifact from EKG leads. There may be an aortic balloon pump with marker 4.4 cm below the  aortic knob, likely accentuated by abdominal projection. Diffuse patchy airspace density. Bowel gas pattern is nonobstructive. Renal contrast from prior CT. IMPRESSION: 1. Enteric tube with tip and side port at the stomach. 2. Aortic balloon pump with marker approximately 4 cm below the aortic knob. Electronically Signed   By: Jorje Guild M.D.   On: 01/14/2021 04:46   ECHOCARDIOGRAM COMPLETE  Result Date: 01/13/2021    ECHOCARDIOGRAM REPORT   Patient Name:   Morgan Daniels Date of Exam: 01/13/2021 Medical Rec #:  [id number]    Height:       66.0 in Accession #:    [id number]   Weight:       202.4 lb Date of Birth:  Apr 06, 1960    BSA:          2.010 m Patient Age:    60 years     BP:           112/65 mmHg Patient Gender: F            HR:           114 bpm. Exam Location:  [facility] Procedure: 2D Echo, Color Doppler and Cardiac Doppler Indications:     I42.9 Cardiomyopathy-unspecified  History:         Patient has no prior history of Echocardiogram examinations.                  CKD and Stroke; Risk Factors:Current Smoker, Hypertension and                  Diabetes.  Sonographer:     Charmayne Sheer Referring Phys:  [id number] BRITTON L [place] Diagnosing Phys: Ida Rogue MD  Sonographer Comments: Echo performed with patient  supine and on artificial respirator. IMPRESSIONS  1. Left ventricular ejection fraction, by estimation, is 60 to 65%. The left ventricle has normal function. The left ventricle has no regional wall motion abnormalities. Left ventricular diastolic parameters are consistent with Grade I diastolic dysfunction (impaired relaxation).  2. Right ventricular systolic function is normal. The right ventricular size is normal. Tricuspid regurgitation signal is inadequate for assessing PA pressure.  3. The mitral valve is normal in structure. Mild mitral valve regurgitation. No evidence of mitral stenosis.  4. The aortic valve is normal in structure. Aortic valve regurgitation is not visualized. Aortic valve sclerosis/calcification is present, without any evidence of aortic stenosis.  5. The inferior vena cava is dilated in size with >50% respiratory variability, suggesting right atrial pressure of 8 mmHg. FINDINGS  Left Ventricle: Left ventricular ejection fraction, by estimation, is 60 to 65%. The left ventricle has normal function. The left ventricle has no regional wall motion abnormalities. The left ventricular internal cavity size was normal in size. There is  borderline left ventricular hypertrophy. Left ventricular diastolic parameters are consistent with Grade I diastolic dysfunction (impaired relaxation). Right Ventricle: The right ventricular size is normal. No increase in right ventricular wall thickness. Right ventricular systolic function is normal. Tricuspid regurgitation signal is inadequate for assessing PA pressure. Left Atrium: Left atrial size was normal in size. Right Atrium: Right atrial size was normal in size. Pericardium: There is no evidence of pericardial effusion. Mitral Valve: The mitral valve is normal in structure. Mild mitral valve regurgitation. No evidence of mitral valve stenosis. MV peak gradient, 9.1 mmHg. The mean mitral valve gradient is 5.0 mmHg. Tricuspid Valve: The tricuspid valve is  normal in structure. Tricuspid valve regurgitation is mild . No evidence of tricuspid stenosis.  Aortic Valve: The aortic valve is normal in structure. Aortic valve regurgitation is not visualized. Aortic valve sclerosis/calcification is present, without any evidence of aortic stenosis. Aortic valve mean gradient measures 9.0 mmHg. Aortic valve peak  gradient measures 14.9 mmHg. Aortic valve area, by VTI measures 2.60 cm. Pulmonic Valve: The pulmonic valve was normal in structure. Pulmonic valve regurgitation is not visualized. No evidence of pulmonic stenosis. Aorta: The aortic root is normal in size and structure. Venous: The inferior vena cava is dilated in size with greater than 50% respiratory variability, suggesting right atrial pressure of 8 mmHg. IAS/Shunts: No atrial level shunt detected by color flow Doppler.  LEFT VENTRICLE PLAX 2D LVIDd:         5.40 cm   Diastology LVIDs:         3.80 cm   LV e' medial:    6.20 cm/s LV PW:         1.10 cm   LV E/e' medial:  17.4 LV IVS:        0.90 cm   LV e' lateral:   5.22 cm/s LVOT diam:     2.20 cm   LV E/e' lateral: 20.7 LV SV:         85 LV SV Index:   42 LVOT Area:     3.80 cm  RIGHT VENTRICLE RV Basal diam:  2.70 cm RV S prime:     22.60 cm/s LEFT ATRIUM           Index        RIGHT ATRIUM           Index LA diam:      4.20 cm 2.09 cm/m   RA Area:     13.80 cm LA Vol (A2C): 37.6 ml 18.71 ml/m  RA Volume:   33.90 ml  16.87 ml/m LA Vol (A4C): 37.3 ml 18.56 ml/m  AORTIC VALVE                     PULMONIC VALVE AV Area (Vmax):    2.42 cm      PV Vmax:       1.74 m/s AV Area (Vmean):   2.39 cm      PV Vmean:      111.000 cm/s AV Area (VTI):     2.60 cm      PV VTI:        0.281 m AV Vmax:           193.00 cm/s   PV Peak grad:  12.1 mmHg AV Vmean:          146.000 cm/s  PV Mean grad:  6.0 mmHg AV VTI:            0.327 m AV Peak Grad:      14.9 mmHg AV Mean Grad:      9.0 mmHg LVOT Vmax:         123.00 cm/s LVOT Vmean:        91.900 cm/s LVOT VTI:           0.224 m LVOT/AV VTI ratio: 0.69  AORTA Ao Root diam: 3.20 cm MITRAL VALVE MV Area (PHT): 7.29 cm     SHUNTS MV Area VTI:   3.45 cm     Systemic VTI:  0.22 m MV Peak grad:  9.1 mmHg     Systemic Diam: 2.20 cm MV Mean grad:  5.0 mmHg MV Vmax:  1.51 m/s MV Vmean:      105.0 cm/s MV Decel Time: 104 msec MV E velocity: 108.00 cm/s MV A velocity: 121.00 cm/s MV E/A ratio:  0.89 Ida Rogue MD Electronically signed by Ida Rogue MD Signature Date/Time: 01/13/2021/1:42:11 PM    Final     Cardiac Studies   I personally reviewed the cath films and echo images.  Patient Profile     60 y.o. female with multivessel CAD.  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  Cardiac surgery informed of the consult.  We will come up with plan wants for revascularization once they have seen the patient.  Continue IV heparin for intra-aortic balloon pump.  Start Metoprolol 12.5 mg BID and prn labetolol.  IV NTG for now as well.  Echo shows improved LVEF compared to cath.  Will give a dose of IV lasix given elevated LVEDP at cath.  Wean NTG if BP drops with other therapy.  No ACE-I until decision made about surgery.   PAD: left leg stent previously placed.   Plan discussed with the family at bedside.     For questions or updates, please contact [place] Please consult [email or web address] for contact info under        Signed, Larae Grooms, MD  01/14/2021, 9:02 AM

## 2021-01-14 NOTE — Progress Notes (Signed)
Orthopedic Tech Progress Note Patient Details:  Morgan Daniels 1960/03/24 [id number]  Ortho Devices Type of Ortho Device: Knee Immobilizer Ortho Device/Splint Location: rle Ortho Device/Splint Interventions: Ordered, Application, Adjustment  Delivered and applied to pt at RN's request. Post Interventions Patient Tolerated: Well Instructions Provided: Care of device, Adjustment of device  Karolee Stamps 01/14/2021, 7:42 PM

## 2021-01-14 NOTE — Progress Notes (Signed)
Initial Nutrition Assessment  DOCUMENTATION CODES:   Not applicable  INTERVENTION:   Tube Feeding via OG:  Vital High Protein at 50 ml/hr Provides 1200 kcals, 106 g of protein and 1008 mL of free water  TF regimen and propofol at current rate providing 1926 total kcal/day   NUTRITION DIAGNOSIS:   Inadequate oral intake related to acute illness as evidenced by NPO status.  GOAL:   Patient will meet greater than or equal to 90% of their needs  MONITOR:   Vent status, Labs, TF tolerance, Weight trends  REASON FOR ASSESSMENT:   Ventilator, Consult Enteral/tube feeding initiation and management  ASSESSMENT:   60 yo female admitted with acute respiratory failure secondary to acute pulmonary edema with acute decompensated heart failure, NSTEMI, cardiogenic shock with IABP. PMH includes HTN, DM, HLD, hx peptic ulcer, COPD, CAD   11/27 IABP insertion, Intubated  Pt remains on vent support  Propofol: 27.5 ml/hr  Current wt 86.8 kg, moderate generalized edema  Unable to obtain diet and weight history  OG tube with tip in stomach per abd xray  Labs: reviewed Meds: lasix, ss novolog, semglee, novolog q 4 hours  Diet Order:   Diet Order             Diet NPO time specified  Diet effective now                   EDUCATION NEEDS:   Not appropriate for education at this time  Skin:  Skin Assessment: Reviewed RN Assessment  Last BM:  no documented BM  Height:   Ht Readings from Last 1 Encounters:  01/12/21 5\' 6"  (1.676 m)    Weight:   Wt Readings from Last 1 Encounters:  01/14/21 86.8 kg    BMI:  Body mass index is 30.89 kg/m.  Estimated Nutritional Needs:   Kcal:  1700-1900 kcals  Protein:  80-105 g  Fluid:  >/= 1.7 L   Kerman Passey MS, RDN, LDN, CNSC Registered Dietitian III Clinical Nutrition RD Pager and On-Call Pager Number Located in [place]

## 2021-01-14 NOTE — Plan of Care (Signed)
  Problem: Education: Goal: Knowledge of General Education information will improve Description: Including pain rating scale, medication(s)/side effects and non-pharmacologic comfort measures Outcome: Not Progressing   Problem: Clinical Measurements: Goal: Respiratory complications will improve Outcome: Not Progressing   Problem: Nutrition: Goal: Adequate nutrition will be maintained Outcome: Not Progressing   Problem: Coping: Goal: Level of anxiety will decrease Outcome: Not Progressing   Problem: Pain Managment: Goal: General experience of comfort will improve Outcome: Progressing   Problem: Skin Integrity: Goal: Risk for impaired skin integrity will decrease Outcome: Progressing

## 2021-01-14 NOTE — Progress Notes (Signed)
[facility] Progress Note Patient Name: Kamari Bilek DOB: 1960-06-28 MRN: [id number]   Date of Service  01/14/2021  HPI/Events of Note  Bedside requesting PRN fent boluses for agitation   eICU Interventions  Order placed     Intervention Category Major Interventions: Delirium, psychosis, severe agitation - evaluation and management  Margaretmary Lombard 01/14/2021, 10:13 PM

## 2021-01-14 NOTE — Progress Notes (Signed)
NAME:  Morgan Daniels, MRN:  [id number], DOB:  06-25-1960, LOS: 1 ADMISSION DATE:  01/13/2021, CONSULTATION DATE:  11/28 REFERRING MD:  Marcelle Smiling, REASON FOR CONSULT:  ventilator assistance   History of Present Illness:  Patient is encephalopathic and/or intubated. Therefore history has been obtained from chart review.  Morgan Daniels is a 60 y.o. female who is seen in consultation at the request of Dr. Marcelle Smiling for recommendations on further evaluation and management of ventilator assistance.   They have a pertnent past medical history of PAD, HTN, HLD, current tobacco abuse, COPD, DM2  Morgan Daniels is an 60 y.o. who presented to the [facility] ED on 01/12/2021 via EMS due to acute respiratory failure and unresponsiveness.  Per ED documentation and EMS report the patient began having difficulty breathing and told her husband that she felt as if she was going to die.  Her husband placed her on his home CPAP machine while calling EMS.  Upon EMSs arrival she was agonal he breathing and significantly hypoxic, they transitioned her to a [facility] airway for transport. The patient never lost pulses. They were intubated in the ED. The patient did not meet STEMI criteria. They were worked up for PE VS PNU. Cefepime and vancomycin was given. CTA negative for PE, CTH negative. The patient was extubated 11/27. The patient later developed respiratory distress, was reintubated, CODE STEMI was called. In cath lab they were found to have a 100% occluded RCA. A IABP was placed. Transferred to [facility] to be managed by cardiology.  PCCM was consulted for ventilatory assistance   Pertinent  Medical History  PAD, HTN, HLD, current tobacco abuse, COPD, DM2, CVA, peptic ulcer,   Significant Hospital Events: Including procedures, antibiotic start and stop dates in addition to other pertinent events   01/12/2021: Patient admitted to [facility] ICU with acute hypoxic respiratory failure requiring emergent intubation and mechanical ventilatory  support secondary to pneumonia versus acute heart failure.  CTA negative for PE, CTh negative. 01/13/21: Patient extubated during the day, ECHO: LVEF 60-65%, no wall motion abnormalities, G1DD. cardiology planned for scheduled LHC. Patient then developed respiratory distress and was emergently re-intubated CODE STEMI called patient taken to cath lab, RCA 100% occluded > IABP placed emergently and patient transferred to [facility] emergently. 11/28 TXR to [facility]. Cardiology primary. PCCM consulted  Interim History / Subjective:  Looks comfortable on vent   Objective   Blood pressure (Abnormal) 120/52, pulse 99, temperature 99.2 F (37.3 C), temperature source Axillary, resp. rate 18, weight 86.8 kg, SpO2 93 %.    Vent Mode: PRVC FiO2 (%):  [40 %-85 %] 40 % Set Rate:  [20 bmp] 20 bmp Vt Set:  [450 mL] 450 mL PEEP:  [5 cmH20] 5 cmH20 Plateau Pressure:  [18 cmH20-22 cmH20] 21 cmH20   Intake/Output Summary (Last 24 hours) at 01/14/2021 0918 Last data filed at 01/14/2021 0800 Gross per 24 hour  Intake 475.42 ml  Output 415 ml  Net 60.42 ml   Filed Weights   01/13/21 2300 01/14/21 0500  Weight: 86.8 kg 86.8 kg    Examination: General this is a 60 year old female she is resting in bed NAD on fent and prop  HENT NCAT no JVD MMM Pulm R>L rhonchi pplat 24  Card rrr Ext warm and dry  Neuro opens eyes. Agitated at times. 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 Plan Cont tele Cont asa and statin  NTG gtt IV heparin IABP per cards. Awaiting CVTS consult to consider CABG  Acute respiratory failure with hypoxia CAP- Multifocal pneumonia with possible aspiration. Hx  COPD Tobacco use Initial presentation at [facility]. Reintubated 11/27 secondary to respiratory distress. Abg reviewed.  PCXR worse w/ progression or R>L airspace disease. ETT a little high  Wbc trending down. U strep neg  Plan Cont full vent support Vap bundle  Inc peep to 8 wean FIO2 Am abg  PAD protocol RASS goal -2 (fent and propofol) Currently on day 3 doxy and ceftriaxone will rx x 7d Repeat PCT.  F/u sputum culture and pending U legionella; would have low threshold to widen abx coverage given progression of R>L airspace disease.  Scheduled BDs Tobacco cessation when appropriate   DM2 w/ hyperglycemia  Plan Ssi  Goal 140-180   HX HTN HLD Plan Holding antihypertensives Statin addressed above  HX peptic ulcer Plan PPI   Best Practice (right click and "Reselect all SmartList Selections" daily)   Diet/type: tubefeeds and NPO w/ meds via tube DVT prophylaxis: systemic heparin GI prophylaxis: PPI Lines: N/A Foley:  Yes, and it is still needed Code Status:  full code Last date of multidisciplinary goals of care discussion [per primary]   Critical care time: 32 min     Erick Colace ACNP-BC [place] Pager # [phone] OR # [phone] if no answer

## 2021-01-14 NOTE — Consult Note (Addendum)
NAME:  Morgan Daniels, MRN:  [id number], DOB:  1960-03-08, LOS: 1 ADMISSION DATE:  01/13/2021, CONSULTATION DATE:  11/28 REFERRING MD:  Marcelle Smiling, REASON FOR CONSULT:  ventilator assistance   History of Present Illness:  Patient is encephalopathic and/or intubated. Therefore history has been obtained from chart review.  Morgan Daniels is a 60 y.o. female who is seen in consultation at the request of Dr. Marcelle Smiling for recommendations on further evaluation and management of ventilator assistance.   They have a pertnent past medical history of PAD, HTN, HLD, current tobacco abuse, COPD, DM2  Morgan Daniels is an 60 y.o. [facility] due to acute respiratory failure and unresponsiveness.  Per ED documentation and EMS report the patient began having difficulty breathing and told her husband that she felt as if she was going to die.  Her husband placed her on his home CPAP machine while calling EMS.  Upon EMSs arrival she was agonal he breathing and significantly hypoxic, they transitioned her to a [facility] airway for transport. The patient never lost pulses. They were intubated in the ED. The patient did not meet STEMI criteria. They were worked up for PE VS PNU. Cefepime and vancomycin was given. CTA negative for PE, CTH negative. The patient was extubated 11/27. The patient later developed respiratory distress, was reintubated, CODE STEMI was called. In cath lab they were found to have a 100% occluded RCA. A IABP was placed. Transferred to [facility] to be managed by cardiology.  PCCM was consulted for ventilatory assistance   Pertinent  Medical History  PAD, HTN, HLD, current tobacco abuse, COPD, DM2, CVA, peptic ulcer,   Significant Hospital Events: Including procedures, antibiotic start and stop dates in addition to other pertinent events   01/12/2021: Patient admitted to [facility] ICU with acute hypoxic respiratory failure requiring emergent intubation and mechanical ventilatory  support secondary to pneumonia versus acute heart failure.  CTA negative for PE, CTh negative. 01/13/21: Patient extubated during the day, ECHO: LVEF 60-65%, no wall motion abnormalities, G1DD. cardiology planned for scheduled LHC. Patient then developed respiratory distress and was emergently re-intubated CODE STEMI called patient taken to cath lab, RCA 100% occluded > IABP placed emergently and patient transferred to [facility] emergently. 11/28 TXR to [facility]. Cardiology primary. PCCM consulted  Interim History / Subjective:  See above  Intubated/sedated  30 prop, 100 fent  Unable to obtain subjective evaluation due to patient status  Objective   Blood pressure (!) 99/52, pulse 85, temperature 99.5 F (37.5 C), temperature source Axillary, resp. rate 17, weight 86.8 kg, SpO2 99 %.    Vent Mode: PRVC FiO2 (%):  [30 %-85 %] 50 % Set Rate:  [20 bmp] 20 bmp Vt Set:  [450 mL] 450 mL PEEP:  [5 cmH20] 5 cmH20 Pressure Support:  [5 cmH20] 5 cmH20 Plateau Pressure:  [18 cmH20-22 cmH20] 18 cmH20   Intake/Output Summary (Last 24 hours) at 01/14/2021 0142 Last data filed at 01/14/2021 0000 Gross per 24 hour  Intake 24.89 ml  Output 130 ml  Net -105.11 ml   Filed Weights   01/13/21 2300  Weight: 86.8 kg    Examination: General: In bed, NAD, appears comfortable,  HEENT: MM pink/moist, anicteric, atraumatic Neuro: sedated,  PERRL 4m,  CV: S1S2, NSR, no m/r/g appreciated PULM:  air movement in all lobes, scant wheezes in the upper lobes, trachea midline, chest expansion symmetric GI: soft, bsx4 active, non-tender, foley with yellow urine   Extremities: warm/dry,  no pretibial edema, capillary refill less than 3 seconds  Skin:  no rashes or lesions noted  Resolved Hospital Problem list     Assessment & Plan:  NSTEMI Three vessel CAD with 80% distal left main, 95% stenosis ostial LAD, 95% stenosis ostial left circumflex, 100% occluded proximal RCA Cardiogenic shock with IABP in  place 11/27 ECHO LVEF 60-65%, 11/27 LHC LVEF now 40% with anteroapical and inferoapical hypokinesis. Transfer to [facility] for TCTS consult for urgent CABG. Troponin [id number]. BNp 527. -Management per Cardiology. Per cards note- consulting CT surgery for evaluation.  -ASA/Statin per cards -Continue IABP -Continue heparin drip per pharmacy protocol -Continue in ICU with tele and spo2 monitoring  Acute respiratory failure with hypoxia CAP- Multifocal pneumonia with possible aspiration. Hx COPD Tobacco use Initial presentation at [facility]. Reintubated 11/27 secondary to respiratory distress. -LTVV strategy with tidal volumes of 4-8 cc/kg ideal body weight. Concern for multilobar pneumonia on 11/26 CT. Negative for PE. CRP 2.3, ESR 52, PCT <10>5.05, WBC 24.2>19.9. Tmax 99.9,  -Goal plateau pressures less than 30 and driving pressures less than 15 -Wean PEEP/FiO2 for SpO2 92-98% -VAP bundle -Daily SAT and SBT -PAD bundle with Propofol gtt and fentanyl gtt -RASS goal 0 to -1 -Obtain CXR and ABG -Tobacco cessation education when appropriate  -ABX weaned from Vanc and cefepime to doxycycline/Rocephin -Follow up tracheal aspirate -PRN albuterol with scheduled duonebs  DM2 -Blood Glucose goal 140-180. -SSI  HX HTN HLD -Statin per cardiology -Holding home HTN at this time  HX peptic ulcer -PPI  Best Practice (right click and "Reselect all SmartList Selections" daily)   Diet/type: NPO w/ meds via tube DVT prophylaxis: systemic heparin GI prophylaxis: PPI Lines: N/A Foley:  Yes, and it is still needed Code Status:  full code Last date of multidisciplinary goals of care discussion [per primary]  Labs   CBC: Recent Labs  Lab 01/12/21 2107 01/13/21 0402  WBC 24.2* 19.9*  NEUTROABS 11.0*  --   HGB 12.6 12.1  HCT 41.1 36.6  MCV 89.7 83.4  PLT 538* 434*    Basic Metabolic Panel: Recent Labs  Lab 01/12/21 2107 01/13/21 0402  NA 135 136  K 3.6 4.2  CL 99 102  CO2 18* 26   GLUCOSE 446* 289*  BUN 20 23*  CREATININE 0.96 0.65  CALCIUM 9.0 8.7*  MG 2.3 1.8  PHOS 8.3* 2.9   GFR: Estimated Creatinine Clearance: 84 mL/min (by C-G formula based on SCr of 0.65 mg/dL). Recent Labs  Lab 01/12/21 2107 01/12/21 2344 01/13/21 0402  PROCALCITON <0.10  --  5.05  WBC 24.2*  --  19.9*  LATICACIDVEN  --  1.9  --     Liver Function Tests: Recent Labs  Lab 01/12/21 2107  AST 39  ALT 24  ALKPHOS 78  BILITOT 0.7  PROT 7.9  ALBUMIN 3.4*   No results for input(s): LIPASE, AMYLASE in the last 168 hours. No results for input(s): AMMONIA in the last 168 hours.  ABG    Component Value Date/Time   PHART 7.38 01/12/2021 2353   PCO2ART 44 01/12/2021 2353   PO2ART 94 01/12/2021 2353   HCO3 26.0 01/12/2021 2353   ACIDBASEDEF 9.6 (H) 01/12/2021 2122   O2SAT 97.2 01/12/2021 2353     Coagulation Profile: Recent Labs  Lab 01/12/21 2107  INR 1.2    Cardiac Enzymes: No results for input(s): CKTOTAL, CKMB, CKMBINDEX, TROPONINI in the last 168 hours.  HbA1C: No results found for: HGBA1C  CBG: Recent Labs  Lab 01/13/21 0332 01/13/21 0740 01/13/21 1121 01/13/21 1550 01/13/21 1929  GLUCAP 290* 246* 222* 223* 194*    Review of Systems:   Unable to obtain a ROS due to patient status.  Past Medical History:  She,  has a past medical history of Anxiety, Bilateral carotid artery stenosis (2014), Chronic kidney disease (2017), Current smoker, CVA (cerebral vascular accident) ([place]) (2013), Depression, Diverticulosis, Fatty liver, Fibromyalgia, GERD (gastroesophageal reflux disease), Hiatal hernia, Hypercholesteremia, Hypertension, IBS (irritable bowel syndrome), PAD (peripheral artery disease) ([place]), Peptic ulcer, and T2DM (type 2 diabetes mellitus) ([place]).   Surgical History:  Unable to obtain surgical hx due to patient status  Social History:      Family History:  Her family history is not on file.   Allergies Allergies  Allergen Reactions    Simvastatin     Diarrhea, nausea,vomiting   Milk-Related Compounds     diarrhea   Morphine And Related Itching   Latex      Home Medications  Prior to Admission medications   Medication Sig Start Date End Date Taking? Authorizing Provider  clopidogrel (PLAVIX) 75 MG tablet Take 75 mg by mouth daily. 01/03/21   [provider]  FLUoxetine (PROZAC) 40 MG capsule Take 80 mg by mouth daily. 12/30/20   [provider]  gabapentin (NEURONTIN) 600 MG tablet Take 600 mg by mouth 3 (three) times daily. 12/08/20   [provider]  gemfibrozil (LOPID) 600 MG tablet Take 600 mg by mouth 2 (two) times daily. 12/20/20   [provider]  hydrochlorothiazide (HYDRODIURIL) 12.5 MG tablet Take 12.5 mg by mouth daily. 12/20/20   [provider]  metoprolol succinate (TOPROL-XL) 50 MG 24 hr tablet Take 50 mg by mouth daily. 11/04/20   [provider]  NOVOLOG FLEXPEN 100 UNIT/ML FlexPen SMARTSIG:20-26 Unit(s) SUB-Q 3 Times Daily 09/28/20   [provider]     Critical care time: 40 minutes     Redmond School., MSN, APRN, AGACNP-BC [facility]  01/14/2021 , 2:35 AM  Please see [email or web address] for pager details  If no response, please call [phone] After hours, please call Elink at [phone]

## 2021-01-14 NOTE — Progress Notes (Signed)
Peripherally Inserted Central Catheter Placement  The IV Nurse has discussed with the patient and/or persons authorized to consent for the patient, the purpose of this procedure and the potential benefits and risks involved with this procedure.  The benefits include less needle sticks, lab draws from the catheter, and the patient may be discharged home with the catheter. Risks include, but not limited to, infection, bleeding, blood clot (thrombus formation), and puncture of an artery; nerve damage and irregular heartbeat and possibility to perform a PICC exchange if needed/ordered by physician.  Alternatives to this procedure were also discussed.  Bard Power PICC patient education guide, fact sheet on infection prevention and patient information card has been provided to patient /or left at bedside.    PICC Placement Documentation  PICC Triple Lumen 08/28/17 PICC Right Basilic 40 cm 0 cm (Active)  Indication for Insertion or Continuance of Line Prolonged intravenous therapies 01/14/21 1815  Exposed Catheter (cm) 0 cm 01/14/21 1815  Site Assessment Clean;Intact;Dry 01/14/21 1815  Lumen #1 Status Flushed;Saline locked;Blood return noted 01/14/21 1815  Lumen #2 Status Saline locked;Flushed;Blood return noted 01/14/21 1815  Lumen #3 Status Flushed;Saline locked;Blood return noted 01/14/21 1815  Dressing Type Transparent 01/14/21 1815  Dressing Status Clean;Dry;Intact 01/14/21 1815  Antimicrobial disc in place? Yes 01/14/21 1815  Dressing Intervention New dressing;Other (Comment) 01/14/21 1815  Dressing Change Due 01/21/21 01/14/21 1815   Consent signed by husband at bedside    Synthia Innocent 01/14/2021, 6:16 PM

## 2021-01-14 NOTE — H&P (View-Only) (Signed)
[address]       [place] [address]             [phone]        [facility] [place] Medical Record #[id number] Date of Birth: 1960/02/23  Referring: Larae Grooms, MD  Primary Care: None on file Primary Cardiologist: Larae Grooms, MD   Reason for consult: Multivessel coronary artery disease acute non-ST elevation myocardial infarction, acute ventilator dependent respiratory failure.   History of Present Illness:     Ms. Janaiyah Blackard is a 60 year old female with a past history of peripheral arterial disease status post bilateral carotid artery stenting several years ago, hypertension, dyslipidemia, tobacco abuse with COPD, and type 2 diabetes mellitus, and severe, chronic irritable bowel syndrome who presented to [facility] emergency room on 01/12/2021 via EMS for management of acute respiratory failure and unresponsiveness.  The EMS service reported that on their arrival to her home, she had agonal respirations and was severely hypoxic.  She was supported with a King airway in route to the hospital.  Shortly after arrival to the emergency room, she was intubated and started on mechanical ventilation.  Initial EKG showed ST depressions in the V leads.  Serial troponins peaked at around 3000.  Cardiology was consulted.  Additional work-up obtained shortly after presentation included chest x-ray that showed diffuse interstitial and airspace opacities consistent with pulmonary edema.  CT scan of the head was negative for any acute intracranial abnormality.  CTA of the chest was negative for pulmonary embolus.  Pulmonary edema was confirmed and Multi lobar pneumonia was suspected.  In addition, there was evidence of coronary calcification in all 3 main coronary artery distributions.  There was some bilateral hilar adenopathy noted on the CTA as well.  Ms. Didonato was admitted to the hospital and started on empiric vancomycin and cefepime.   This was later converted to doxycycline and Rocephin.  She was started on a heparin infusion for non-ST elevation MI.  She was weaned from the ventilator and extubated midmorning yesterday.  She was initially stable but later in the day, she developed respiratory distress once again and was reintubated and placed back on mechanical ventilation.  An echocardiogram was obtained showing an estimated ejection fraction of 60 to 65%.  There was no significant valvular pathology.  Ms. Obi cardiologist, Dr. Peggye Form, felt she was having flash pulmonary edema related to her acute MI.  She was taken to the Cath Lab urgently last evening where left heart catheterization was performed demonstrating severe three-vessel coronary artery disease with an 80% distal left main stenosis, 95% ostial LAD stenosis, and 95% stenosis of the ostial circumflex coronary artery.  The right coronary artery was occluded proximally.  There was mildly reduced left ventricular function noted with estimated left ventricular ejection fraction of 40% with anteroapical and inferolateral apical hypokinesis.  Intra-aortic balloon pump was placed in the Cath Lab. Ms. Yellin was transferred to [facility] last night for additional evaluation and management of her severe three-vessel coronary artery disease. The critical care medicine team has been consulted for management of mechanical ventilation.  Ms. Mangels remains sedated and intubated.  The most recent blood gas obtained at 3 AM today showed a pH of 7.32, PCO2 52.9 PO2 100 bicarbonate 27.8 base excess of 1.0. CT surgery has been asked to evaluate [name] for consideration of coronary bypass grafting after presenting with acute non-ST elevation myocardial infarction with pulmonary edema  and possible multi lobar pneumonia. Ms. Brick is currently resting in bed in the ICU.  She is sedated with fentanyl and propofol but awakens to voice and responds appropriately.  She  denies having any pain currently. Ms. Sangiovanni husband is at the bedside and answered questions regarding her history.  He tells me that she is on disability for longstanding severe irritable bowel syndrome.  He said she has been notably more fatigued with minimal activity for about a year.       Current Activity/ Functional Status:  Zubrod Score: At the time of surgery this patient's most appropriate activity status/level should be described as: _0     0    Normal activity, no symptoms _1     1    Restricted in physical strenuous activity but ambulatory, able to do out light work _2     2    Ambulatory and capable of self care, unable to do work activities, up and about                 more than 50%  Of the time                            _3     3    Only limited self care, in bed greater than 50% of waking hours _4     4    Completely disabled, no self care, confined to bed or chair _5     5    Moribund  Past Medical History:  Diagnosis Date   Anxiety    Bilateral carotid artery stenosis 2014   Chronic kidney disease 2017   Current smoker    CVA (cerebral vascular accident) ([place]) 2013   Depression    Diverticulosis    Fatty liver    Fibromyalgia    GERD (gastroesophageal reflux disease)    Hiatal hernia    Hypercholesteremia    Hypertension    IBS (irritable bowel syndrome)    PAD (peripheral artery disease) ([place])    Peptic ulcer    T2DM (type 2 diabetes mellitus) ([place])       Social History   Tobacco Use  Smoking Status Not on file  Smokeless Tobacco Not on file    Social History   Substance and Sexual Activity  Alcohol Use Not on file     Allergies  Allergen Reactions   Simvastatin     Diarrhea, nausea,vomiting   Milk-Related Compounds     diarrhea   Morphine And Related Itching   Latex     Current Facility-Administered Medications  Medication Dose Route Frequency Provider Last Rate Last Admin   acetaminophen (TYLENOL) 160 MG/5ML solution 650 mg  650 mg  Per Tube Q6H PRN Kris Mouton, RPH   650 mg at 01/14/21 1054   albuterol (PROVENTIL) (2.5 MG/3ML) 0.083% nebulizer solution 2.5 mg  2.5 mg Nebulization Q4H PRN Estill Cotta, NP       arformoterol [facility]) nebulizer solution 15 mcg  15 mcg Nebulization BID Noemi Chapel P, DO       aspirin EC tablet 81 mg  81 mg Oral Daily Doyne Keel., MD   81 mg at 01/14/21 0955   atorvastatin (LIPITOR) tablet 80 mg  80 mg Oral Daily Doyne Keel., MD   80 mg at 01/14/21 0956   cefTRIAXone (ROCEPHIN) 2 g in sodium chloride 0.9 % 100 mL IVPB  2 g Intravenous Q24H Estill Cotta, NP  Stopped at 01/14/21 0720   chlorhexidine gluconate (MEDLINE KIT) (PERIDEX) 0.12 % solution 15 mL  15 mL Mouth Rinse BID Cherlynn Kaiser A, MD   15 mL at 01/14/21 0857   Chlorhexidine Gluconate Cloth 2 % PADS 6 each  6 each Topical Daily Elouise Munroe, MD       docusate sodium (COLACE) capsule 100 mg  100 mg Oral BID PRN Doyne Keel., MD       doxycycline (VIBRAMYCIN) 100 mg in sodium chloride 0.9 % 250 mL IVPB  100 mg Intravenous Q12H Estill Cotta, NP   Stopped at 01/14/21 0641   fentaNYL 2568mg in NS 2552m(1042mml) infusion-PREMIX  50-200 mcg/hr Intravenous Continuous NarDoyne KeelMD 15 mL/hr at 01/14/21 1100 150 mcg/hr at 01/14/21 1100   furosemide (LASIX) injection 40 mg  40 mg Intravenous Q8H ClaNoemi Chapel DO       heparin ADULT infusion 100 units/mL (25000 units/250m8m1,400 Units/hr Intravenous Continuous BrykLaren EvertsH 14 mL/hr at 01/14/21 1100 1,400 Units/hr at 01/14/21 1100   insulin aspart (novoLOG) injection 0-15 Units  0-15 Units Subcutaneous Q4H AchaCherlynn KaiserMD   5 Units at 01/14/21 1149   insulin aspart (novoLOG) injection 2 Units  2 Units Subcutaneous Q4H ClarNoemi ChapelDO   2 Units at 01/14/21 1150   insulin glargine-yfgn (SEMGLEE) injection 15 Units  15 Units Subcutaneous Daily ClarNoemi ChapelDO   15 Units at 01/14/21 1149   ipratropium-albuterol  (DUONEB) 0.5-2.5 (3) MG/3ML nebulizer solution 3 mL  3 mL Nebulization Q6H GeorEstill Cotta   3 mL at 01/14/21 0732   labetalol (NORMODYNE) injection 10 mg  10 mg Intravenous Q2H PRN VaraJettie Booze       magnesium sulfate IVPB 2 g 50 mL  2 g Intravenous Once ClarNoemi ChapelDO       MEDLINE mouth rinse  15 mL Mouth Rinse 10 times per day AchaCherlynn KaiserMD   15 mL at 01/14/21 1159   metoprolol tartrate (LOPRESSOR) tablet 12.5 mg  12.5 mg Oral BID VaraLarae GroomsMD   12.5 mg at 01/14/21 [id number] 50 mg in dextrose 5 % 250 mL (0.2 mg/mL) infusion  0-200 mcg/min Intravenous Titrated NarcDoyne KeelD 27 mL/hr at 01/14/21 1100 90 mcg/min at 01/14/21 1100   pantoprazole (PROTONIX) injection 40 mg  40 mg Intravenous Daily GeorEstill Cotta   40 mg at 01/14/21 0955   polyethylene glycol (MIRALAX / GLYCOLAX) packet 17 g  17 g Oral Daily PRN NarcDoyne KeelD       potassium chloride (KLOR-CON) packet 40 mEq  40 mEq Per Tube Once ClarNoemi ChapelDO       propofol (DIPRIVAN) 1000 MG/100ML infusion  5-50 mcg/kg/min Intravenous Titrated NarcDoyne KeelD 27.5 mL/hr at 01/14/21 1119 50 mcg/kg/min at 01/14/21 1119   revefenacin (YUPELRI) nebulizer solution 175 mcg  175 mcg Nebulization Daily ClarJulian Hy        Medications Prior to Admission  Medication Sig Dispense Refill Last Dose   aspirin EC 81 MG tablet Take 81 mg by mouth daily. Swallow whole.      clopidogrel (PLAVIX) 75 MG tablet Take 75 mg by mouth daily.      dicyclomine (BENTYL) 10 MG capsule Take 10 mg by mouth 3 (three) times daily before meals.      FLUoxetine (PROZAC) 40 MG capsule  Take 80 mg by mouth daily.      gabapentin (NEURONTIN) 600 MG tablet Take 600 mg by mouth 3 (three) times daily.      gemfibrozil (LOPID) 600 MG tablet Take 600 mg by mouth 2 (two) times daily.      hydrochlorothiazide (HYDRODIURIL) 12.5 MG tablet Take 12.5 mg by mouth daily.      insulin degludec  (TRESIBA FLEXTOUCH) 200 UNIT/ML FlexTouch Pen Inject 80 Units into the skin at bedtime.      metoprolol succinate (TOPROL-XL) 50 MG 24 hr tablet Take 50 mg by mouth daily.      NOVOLOG FLEXPEN 100 UNIT/ML FlexPen 20-26 Units 3 (three) times daily with meals.      rosuvastatin (CRESTOR) 10 MG tablet Take 10 mg by mouth daily.      tiZANidine (ZANAFLEX) 4 MG tablet Take 4 mg by mouth 2 (two) times daily as needed for muscle spasms (pain).      traMADol (ULTRAM) 50 MG tablet Take 50 mg by mouth 3 (three) times daily as needed for moderate pain.       No family history on file.   Review of Systems:   ROS --unable to complete review of systems since patient is sedated and intubated      Cardiac Review of Systems: Y or  [    ]= no  Chest Pain [    ]  Resting SOB [   ] Exertional SOB  [  ]  Orthopnea [  ]   Pedal Edema [   ]    Palpitations [  ] Syncope  [  ]   Presyncope [   ]  General Review of Systems: [Y] = yes [  ]=no Constitional: recent weight change [  ]; anorexia [  ]; fatigue [  ]; nausea [  ]; night sweats [  ]; fever [  ]; or chills [  ]                                                               Dental: Last Dentist visit: Within the past 6 months  Eye : blurred vision [  ]; diplopia [   ]; vision changes [  ];  Amaurosis fugax[  ]; Resp: cough [  ];  wheezing[  ];  hemoptysis[  ]; shortness of breath[  ]; paroxysmal nocturnal dyspnea[  ]; dyspnea on exertion[  ]; or orthopnea[  ];  GI:  gallstones[  ], vomiting[  ];  dysphagia[  ]; melena[  ];  hematochezia [  ]; heartburn[  ];   Hx of  Colonoscopy[  ]; GU: kidney stones [  ]; hematuria[  ];   dysuria [  ];  nocturia[  ];  history of     obstruction [  ]; urinary frequency [  ]             Skin: rash, swelling[  ];, hair loss[  ];  peripheral edema[  ];  or itching[  ]; Musculosketetal: myalgias[  ];  joint swelling[  ];  joint erythema[  ];  joint pain[  ];  back pain[  ];  Heme/Lymph: bruising[  ];  bleeding[  ];  anemia[  ];   Neuro: TIA[  ];  headaches[  ];  stroke[  ];  vertigo[  ];  seizures[  ];   paresthesias[  ];  difficulty walking[  ];  Psych:depression[  ]; anxiety[  ];  Endocrine: diabetes[ x ];  thyroid dysfunction[  ];                 Physical Exam: BP (!) 119/46 (BP Location: Right Wrist)   Pulse (!) 102   Temp (!) 101 F (38.3 C) (Axillary)   Resp 20   Wt 86.8 kg   SpO2 95%   BMI 30.89 kg/m    General appearance: Resting in reverse Trendelenburg position in the ICU.  An aortic balloon pump is counterpulsation at one-to-one.  She has IV fentanyl and propofol infusing but she awakens, responds appropriately, and moves extremities. Head: Normocephalic, without obvious abnormality, atraumatic Neck: no adenopathy, no carotid bruit, no JVD, supple, symmetrical, trachea midline, and thyroid not enlarged, symmetric, no tenderness/mass/nodules Lymph nodes: No cervical or clavicular adenopathy Resp: Breath sounds are distant but clear to auscultation throughout. Cardio: Regular rhythm, monitor shows sinus tachycardia.  There is no murmur but she has a pericardial rub heard along the left sternal border. GI: Soft, no apparent tenderness.  Bowel sounds are hypoactive. Extremities: No obvious deformity.  Bilateral radial pulses are easily palpable.  She has a palpable right dorsalis pedis pulse.  Both feet are warm and well-perfused with brisk capillary refill.  The intra-aortic balloon pump exits the right femoral artery. Neurologic: Unable to fully assess due to IV sedation with fentanyl and propofol.  She does open her eyes to voice and responds appropriately.  She is able to move extremities.  Diagnostic Studies & Laboratory data:     Recent Radiology Findings:   DG Abdomen 1 View  Result Date: 01/12/2021 CLINICAL DATA:  Status post OG tube placement. EXAM: ABDOMEN - 1 VIEW COMPARISON:  September 30, 2010 FINDINGS: An orogastric tube is seen with its distal tip noted within the body of the stomach.  The distal side hole sits approximately 5.3 cm distal to the expected region of the gastroesophageal junction. The bowel gas pattern is normal. Radiopaque surgical clips are noted within the right upper quadrant. No radio-opaque calculi or other significant radiographic abnormality are seen. Radiopaque pedicle screws are present within the visualized portion of the lower lumbar spine. IMPRESSION: Orogastric tube positioning, as described above. Electronically Signed   By: Virgina Norfolk M.D.   On: 01/12/2021 21:35   CT HEAD WO CONTRAST (5MM)  Result Date: 01/12/2021 CLINICAL DATA:  Mental status change.  Unknown cause EXAM: CT HEAD WITHOUT CONTRAST TECHNIQUE: Contiguous axial images were obtained from the base of the skull through the vertex without intravenous contrast. COMPARISON:  CT head 02/03/2019 BRAIN: BRAIN Cerebral ventricle sizes are concordant with the degree of cerebral volume loss. Patchy and confluent areas of decreased attenuation are noted throughout the deep and periventricular white matter of the cerebral hemispheres bilaterally, compatible with chronic microvascular ischemic disease. No evidence of large-territorial acute infarction. No parenchymal hemorrhage. No mass lesion. No extra-axial collection. No mass effect or midline shift. No hydrocephalus. Basilar cisterns are patent. Vascular: No hyperdense vessel. Atherosclerotic calcifications are present within the cavernous internal carotid arteries. Skull: No acute fracture or focal lesion. Sinuses/Orbits: Paranasal sinuses and mastoid air cells are clear. The orbits are unremarkable. Other: None. IMPRESSION: No acute intracranial abnormality. Electronically Signed   By: Iven Finn M.D.   On: 01/12/2021 22:09   CT Angio Chest PE W and/or Wo Contrast  Result Date: 01/12/2021 CLINICAL DATA:  Chest pain and shortness of breath. EXAM: CT ANGIOGRAPHY CHEST WITH CONTRAST TECHNIQUE: Multidetector CT imaging of the chest was performed  using the standard protocol during bolus administration of intravenous contrast. Multiplanar CT image reconstructions and MIPs were obtained to evaluate the vascular anatomy. CONTRAST:  90m OMNIPAQUE IOHEXOL 350 MG/ML SOLN COMPARISON:  Chest radiograph dated 01/12/2021. FINDINGS: Evaluation of this exam is limited due to respiratory motion artifact. Cardiovascular: Borderline cardiomegaly. No pericardial effusion. Advanced 3 vessel coronary vascular calcification. Moderate atherosclerotic calcification of the thoracic aorta. No aneurysmal dilatation. Evaluation of the pulmonary arteries is very limited due to respiratory motion artifact. No large or central pulmonary artery embolus identified. Mediastinum/Nodes: Bilateral hilar adenopathy measures 17 mm in short axis on the right. An enteric tube is noted within the esophagus. No mediastinal fluid collection. Lungs/Pleura: Diffuse interstitial and interlobular septal prominence consistent with edema. Diffuse ground-glass and nodular densities throughout the lungs with patchy areas of consolidation bilaterally and predominantly involving the lower lobes concerning for superimposed pneumonia. Clinical correlation is recommended. Small bilateral pleural effusions. No pneumothorax. Endotracheal tube with tip 4 cm above the carina. The central airways are patent. Upper Abdomen: Probable fatty liver. Musculoskeletal: No acute osseous pathology. Review of the MIP images confirms the above findings. IMPRESSION: 1. No CT evidence of central pulmonary artery embolus. 2. Pulmonary edema with probable multilobar pneumonia. Clinical correlation is recommended. 3. Bilateral hilar adenopathy, likely reactive. 4. Aortic Atherosclerosis (ICD10-I70.0). Electronically Signed   By: AAnner CreteM.D.   On: 01/12/2021 22:13   Procedures  Coronary/Graft Acute MI Revascularization  IABP Insertion  LEFT HEART CATH AND CORONARY ANGIOGRAPHY   Conclusion      Prox RCA lesion is  100% stenosed.   Dist LM to Ost LAD lesion is 95% stenosed.   Ost Cx lesion is 95% stenosed.   Mid Cx lesion is 70% stenosed.   Mid LAD lesion is 75% stenosed.   Mid LM to Dist LM lesion is 80% stenosed.   The left ventricular ejection fraction is 35-45% by visual estimate.   1.  Non-ST elevation myocardial infarction 2.  Three-vessel coronary artery disease with 80% distal left main, 95% stenosis ostial LAD, 95% stenosis ostial left circumflex, occluded proximal RCA 3.  Mildly reduced left ventricular function with estimated LV ejection fraction 40% with anteroapical and inferoapical hypokinesis 4.  Intra-aortic balloon pump placed   Recommendations   [facility]  Coronary Findings  Diagnostic Dominance: Right Left Main  Mid LM to Dist LM lesion is 80% stenosed. The lesion is discrete. The lesion is moderately calcified.  Dist LM to Ost LAD lesion is 95% stenosed. The lesion is discrete. The lesion is moderately calcified.    Left Anterior Descending  Mid LAD lesion is 75% stenosed. The lesion is tubular.    Left Circumflex  Ost Cx lesion is 95% stenosed. The lesion is discrete. The lesion is calcified.  Mid Cx lesion is 70% stenosed. The lesion is tubular.    Right Coronary Artery  Prox RCA lesion is 100% stenosed.    Right Posterior Descending Artery  Collaterals  RPDA filled by collaterals from Dist LAD.      Intervention   No interventions have been documented.   Wall Motion              Left Heart  Left Ventricle The left ventricular ejection fraction is 35-45% by  visual estimate.  Aorta Descending Aorta:  The descending aorta is normal in size.   Abdominal Aorta:  The abdominal aorta is normal in size.   Coronary Diagrams   Diagnostic Dominance: Right       ECHOCARDIOGRAM REPORT         Patient Name:   YOUNIQUE CASAD Date of Exam: 01/13/2021  Medical Rec #:   [id number]    Height:       66.0 in  Accession #:    [id number]   Weight:       202.4 lb  Date of Birth:  April 14, 1960    BSA:          2.010 m  Patient Age:    68 years     BP:           112/65 mmHg  Patient Gender: F            HR:           114 bpm.  Exam Location:  [facility]   Procedure: 2D Echo, Color Doppler and Cardiac Doppler   Indications:     I42.9 Cardiomyopathy-unspecified     History:         Patient has no prior history of Echocardiogram  examinations.                   CKD and Stroke; Risk Factors:Current Smoker, Hypertension  and                   Diabetes.     Sonographer:     Charmayne Sheer  Referring Phys:  [id number] BRITTON L RUST-CHESTER  Diagnosing Phys: Ida Rogue MD      Sonographer Comments: Echo performed with patient supine and on artificial  respirator.  IMPRESSIONS     1. Left ventricular ejection fraction, by estimation, is 60 to 65%. The  left ventricle has normal function. The left ventricle has no regional  wall motion abnormalities. Left ventricular diastolic parameters are  consistent with Grade I diastolic  dysfunction (impaired relaxation).   2. Right ventricular systolic function is normal. The right ventricular  size is normal. Tricuspid regurgitation signal is inadequate for assessing  PA pressure.   3. The mitral valve is normal in structure. Mild mitral valve  regurgitation. No evidence of mitral stenosis.   4. The aortic valve is normal in structure. Aortic valve regurgitation is  not visualized. Aortic valve sclerosis/calcification is present, without  any evidence of aortic stenosis.   5. The inferior vena cava is dilated in size with >50% respiratory  variability, suggesting right atrial pressure of 8 mmHg.   FINDINGS   Left Ventricle: Left ventricular ejection fraction, by estimation, is 60  to 65%. The left ventricle has normal function. The left ventricle has no  regional wall motion abnormalities. The left ventricular internal  cavity  size was normal in size. There is   borderline left ventricular hypertrophy. Left ventricular diastolic  parameters are consistent with Grade I diastolic dysfunction (impaired  relaxation).   Right Ventricle: The right ventricular size is normal. No increase in  right ventricular wall thickness. Right ventricular systolic function is  normal. Tricuspid regurgitation signal is inadequate for assessing PA  pressure.   Left Atrium: Left atrial size was normal in size.   Right Atrium: Right atrial size was normal in size.   Pericardium: There is no evidence of pericardial effusion.   Mitral Valve: The mitral valve is normal in structure.  Mild mitral valve  regurgitation. No evidence of mitral valve stenosis. MV peak gradient, 9.1  mmHg. The mean mitral valve gradient is 5.0 mmHg.   Tricuspid Valve: The tricuspid valve is normal in structure. Tricuspid  valve regurgitation is mild . No evidence of tricuspid stenosis.   Aortic Valve: The aortic valve is normal in structure. Aortic valve  regurgitation is not visualized. Aortic valve sclerosis/calcification is  present, without any evidence of aortic stenosis. Aortic valve mean  gradient measures 9.0 mmHg. Aortic valve peak   gradient measures 14.9 mmHg. Aortic valve area, by VTI measures 2.60 cm.   Pulmonic Valve: The pulmonic valve was normal in structure. Pulmonic valve  regurgitation is not visualized. No evidence of pulmonic stenosis.   Aorta: The aortic root is normal in size and structure.   Venous: The inferior vena cava is dilated in size with greater than 50%  respiratory variability, suggesting right atrial pressure of 8 mmHg.   IAS/Shunts: No atrial level shunt detected by color flow Doppler.      LEFT VENTRICLE  PLAX 2D  LVIDd:         5.40 cm   Diastology  LVIDs:         3.80 cm   LV e' medial:    6.20 cm/s  LV PW:         1.10 cm   LV E/e' medial:  17.4  LV IVS:        0.90 cm   LV e' lateral:   5.22  cm/s  LVOT diam:     2.20 cm   LV E/e' lateral: 20.7  LV SV:         85  LV SV Index:   42  LVOT Area:     3.80 cm      RIGHT VENTRICLE  RV Basal diam:  2.70 cm  RV S prime:     22.60 cm/s   LEFT ATRIUM           Index        RIGHT ATRIUM           Index  LA diam:      4.20 cm 2.09 cm/m   RA Area:     13.80 cm  LA Vol (A2C): 37.6 ml 18.71 ml/m  RA Volume:   33.90 ml  16.87 ml/m  LA Vol (A4C): 37.3 ml 18.56 ml/m   AORTIC VALVE                     PULMONIC VALVE  AV Area (Vmax):    2.42 cm      PV Vmax:       1.74 m/s  AV Area (Vmean):   2.39 cm      PV Vmean:      111.000 cm/s  AV Area (VTI):     2.60 cm      PV VTI:        0.281 m  AV Vmax:           193.00 cm/s   PV Peak grad:  12.1 mmHg  AV Vmean:          146.000 cm/s  PV Mean grad:  6.0 mmHg  AV VTI:            0.327 m  AV Peak Grad:      14.9 mmHg  AV Mean Grad:      9.0 mmHg  LVOT Vmax:  123.00 cm/s  LVOT Vmean:        91.900 cm/s  LVOT VTI:          0.224 m  LVOT/AV VTI ratio: 0.69     AORTA  Ao Root diam: 3.20 cm   MITRAL VALVE  MV Area (PHT): 7.29 cm     SHUNTS  MV Area VTI:   3.45 cm     Systemic VTI:  0.22 m  MV Peak grad:  9.1 mmHg     Systemic Diam: 2.20 cm  MV Mean grad:  5.0 mmHg  MV Vmax:       1.51 m/s  MV Vmean:      105.0 cm/s  MV Decel Time: 104 msec  MV E velocity: 108.00 cm/s  MV A velocity: 121.00 cm/s  MV E/A ratio:  0.89   Ida Rogue MD  Electronically signed by Ida Rogue MD  Signature Date/Time: 01/13/2021/1:42:11 PM       DG CHEST PORT 1 VIEW  Result Date: 01/14/2021 CLINICAL DATA:  Initial evaluation for acute respiratory failure with hypoxia. EXAM: PORTABLE CHEST 1 VIEW COMPARISON:  Radiograph from 01/12/2021 FINDINGS: Endotracheal tube in place with tip at the superior margin of the clavicles. Diffuse related pad overlies the left chest. Stable cardiomegaly. Mediastinal silhouette within normal limits. Lungs mildly hypoinflated. Persistent bilateral  interstitial and airspace disease, most pronounced at the right lung base. Overall appearance is improved, suspected to reflect improved edema. Probable small bilateral pleural effusions. No pneumothorax. Osseous structures are unchanged. IMPRESSION: 1. Tip of the endotracheal tube at the superior margin of the clavicles. 2. Persistent bilateral airspace disease, most pronounced at the right lung base. Overall appearance is mildly improved from prior, favored to reflect improved edema. Superimposed infection/infiltrates not excluded. 3. Probable small bilateral pleural effusions. Electronically Signed   By: Jeannine Boga M.D.   On: 01/14/2021 02:37   DG Chest Portable 1 View  Result Date: 01/12/2021 CLINICAL DATA:  et tube placement.  SOB EXAM: PORTABLE CHEST 1 VIEW COMPARISON:  None. FINDINGS: Endotracheal tube with tip terminating 6 cm above the carina. Enteric tube coursing below the hemidiaphragm with tip and side port collimated off view. Lines and tubes as well as cardiac paddles overlie the chest. The heart and mediastinal contours are within normal limits. Increased interstitial markings and diffuse patchy airspace opacities. No pleural effusion. No pneumothorax. No acute osseous abnormality. IMPRESSION: 1. Diffuse interstitial and airspace opacities suggestive of pulmonary edema. Superimposed infection/inflammation not excluded. 2. Endotracheal tube in good position. 3. Enteric tube coursing below the hemidiaphragm with tip and side port collimated off view. Electronically Signed   By: Iven Finn M.D.   On: 01/12/2021 21:34   DG Abd Portable 1V  Result Date: 01/14/2021 CLINICAL DATA:  Encounter for orogastric tube EXAM: PORTABLE ABDOMEN - 1 VIEW COMPARISON:  Chest CT 2 days ago FINDINGS: Enteric tube with tip and side port at the stomach. Artifact from EKG leads. There may be an aortic balloon pump with marker 4.4 cm below the aortic knob, likely accentuated by abdominal projection.  Diffuse patchy airspace density. Bowel gas pattern is nonobstructive. Renal contrast from prior CT. IMPRESSION: 1. Enteric tube with tip and side port at the stomach. 2. Aortic balloon pump with marker approximately 4 cm below the aortic knob. Electronically Signed   By: Jorje Guild M.D.   On: 01/14/2021 04:46     I have independently reviewed the above radiologic studies and discussed with the patient  Recent Lab Findings: Lab Results  Component Value Date   WBC 17.6 (H) 01/14/2021   HGB 10.9 (L) 01/14/2021   HCT 32.0 (L) 01/14/2021   PLT 296 01/14/2021   GLUCOSE 216 (H) 01/14/2021   TRIG 187 (H) 01/13/2021   ALT 24 01/12/2021   AST 39 01/12/2021   NA 135 01/14/2021   K 4.2 01/14/2021   CL 101 01/14/2021   CREATININE 0.91 01/14/2021   BUN 24 (H) 01/14/2021   CO2 26 01/14/2021   INR 1.2 01/12/2021   HGBA1C 6.6 (H) 01/12/2021      Assessment / Plan:      -60 year old female with multiple risk factors including tobacco use, type 2 diabetes, and dyslipidemia was brought to [facility] by EMS with acute respiratory failure requiring intubation 2 days ago.  She was diuresed and treated empirically with antibiotics for suspected multilobar pneumonia.  She was weaned from the ventilator and extubated but within a few hours required reintubation for flash pulmonary edema.  She was taken to the Cath Lab last evening for left heart catheterization which demonstrated severe three-vessel coronary artery disease detailed above.  She subsequently had a right femoral intra-aortic balloon pump placed.  She is currently stable in the ICU.  Left heart catheterization demonstrated severe three-vessel coronary artery disease as detailed in the report above.  Artery artery bypass grafting is her best option for revascularization.  She is presently stable with aortic balloon pump catheter pulsating at one-to-one and on heparin infusion and she appears to have no compromise in distal  perfusion.. The ABG obtained earlier this morning was continuing to show a significant respiratory acidosis although she is now on an FiO2 of 0.4, PEEP of 8, and rate of 20 with oxygen saturation of 96%.  Chest x-ray obtained earlier this morning was also showing diffuse patchy airspace densities.  Dr. Roxan Hockey will review clinical data and angiography and discussion regarding timing of surgery will follow.  -Type 2 diabetes mellitus-managed with NovoLog insulin prior to admission  -Chronic obstructive pulmonary disease  -Peripheral arterial disease-she is status post bilateral carotid artery stenting and the nurse reported to me that she recently had a stent placed in her left lower extremity.  She was taking Plavix prior to admission.   I  spent 30 minutes counseling the patient face to face.   Antony Odea, PA-C  01/14/2021 12:17 PM  I have seen and examined Mrs. Nordell and talked with her husband.  She is a 39 year old woman with multiple cardiac risk factors and known PAD.  She presented with acute respiratory failure.  Ruled in for MI.  Catheterization has left main and severe three-vessel coronary disease.  Intra-aortic balloon pump placed for perfusion.  Chest x-ray shows pulmonary edema and diffuse patchy infiltrates.  Being treated empirically for pneumonia with ceftriaxone.  Also being diuresed.  She does open her eyes but does not follow commands.  She does move her extremities spontaneously and somewhat purposefully.  Not a candidate for CABG currently.  We need a better assessment of her mental status and also improvement of her pulmonary status prior to bypass surgery.  Hopefully she will progress to the point we can do surgery later this week.  Will follow-up.  Revonda Standard Roxan Hockey, MD [facility] [phone]

## 2021-01-14 NOTE — Progress Notes (Signed)
[place] Progress Note Patient Name: Morgan Daniels DOB: 17-Dec-1960 MRN: [id number]   Date of Service  01/14/2021  HPI/Events of Note  Pt with IABP and mech ventilation.  STEMI   eICU Interventions  Remains sedated on vent     Intervention Category Evaluation Type: New Patient Evaluation  Tilden Dome 01/14/2021, 2:59 AM

## 2021-01-14 NOTE — Progress Notes (Signed)
ANTICOAGULATION CONSULT NOTE - Follow Up Consult  Pharmacy Consult for heparin Indication:  IABP  Labs: Recent Labs    01/12/21 2107 01/12/21 2344 01/13/21 0402 01/13/21 0823 01/13/21 1256 01/13/21 1605 01/13/21 1827 01/14/21 0153 01/14/21 0307 01/14/21 1017 01/14/21 1930  HGB 12.6  --  12.1  --   --   --   --  10.3* 10.9*  --   --   HCT 41.1  --  36.6  --   --   --   --  31.8* 32.0*  --   --   PLT 538*  --  434*  --   --   --   --  296  --   --   --   APTT 32  --   --   --   --   --   --   --   --   --   --   LABPROT 15.0  --   --   --   --   --   --   --   --   --   --   INR 1.2  --   --   --   --   --   --   --   --   --   --   HEPARINUNFRC  --   --   --    < >  --   --  <0.10* <0.10*  --  <0.10* <0.10*  CREATININE 0.96  --  0.65  --   --   --   --  0.91  --   --   --   TROPONINIHS 321*   < > 2,772*  --  2,913* 2,587* 1,790*  --   --   --   --    < > = values in this interval not displayed.     Assessment: 60yo female subtherapeutic on heparin after arrival from [facility] cath lab w/ IABP placement, now awaiting CVTS consult.  Heparin level remains subtherapeutic at <0.10 on 1550 units/hr. Hgb trending down but stable. No s/sx of bleeding noted.   Goal of Therapy:  Heparin level 0.2-0.5 units/ml   Plan:  Increase heparin rate to 1800 units/hr Repeat heparin level in 6 hours Monitor CBC, heparin level, and s/sx of bleeding daily.   Alanda Slim, PharmD, [facility] Clinical Pharmacist Please see AMION for all Pharmacists' Contact Phone Numbers 01/14/2021, 9:46 PM

## 2021-01-14 NOTE — Progress Notes (Signed)
ANTICOAGULATION CONSULT NOTE - Follow Up Consult  Pharmacy Consult for heparin Indication:  IABP  Labs: Recent Labs    01/12/21 2107 01/12/21 2344 01/13/21 0402 01/13/21 0823 01/13/21 1256 01/13/21 1605 01/13/21 1827 01/14/21 0153 01/14/21 0307 01/14/21 1017  HGB 12.6  --  12.1  --   --   --   --  10.3* 10.9*  --   HCT 41.1  --  36.6  --   --   --   --  31.8* 32.0*  --   PLT 538*  --  434*  --   --   --   --  296  --   --   APTT 32  --   --   --   --   --   --   --   --   --   LABPROT 15.0  --   --   --   --   --   --   --   --   --   INR 1.2  --   --   --   --   --   --   --   --   --   HEPARINUNFRC  --   --   --    < >  --   --  <0.10* <0.10*  --  <0.10*  CREATININE 0.96  --  0.65  --   --   --   --  0.91  --   --   TROPONINIHS 321*   < > 2,772*  --  2,913* 2,587* 1,790*  --   --   --    < > = values in this interval not displayed.     Assessment: 60yo female subtherapeutic on heparin after arrival from [facility] cath lab w/ IABP placement, now awaiting CVTS consult.  Heparin level remains subtherapeutic at <0.10 on 1400 units/hr. Hgb trending down but stable. No s/sx of bleeding noted.   Goal of Therapy:  Heparin level 0.2-0.5 units/ml   Plan:  Increase heparin rate to 1550 units/hr Repeat heparin level in 6 hours Monitor CBC, heparin level, and s/sx of bleeding daily.   Joseph Art, Pharm.D. PGY-1 Pharmacy Resident [phone] 01/14/2021 12:50 PM

## 2021-01-14 NOTE — Consult Note (Addendum)
    [address]       [place] [address]             [phone]        Dilan Rebello [facility] Medical Record #[id number] Date of Birth: 10/07/1960  Referring: Jayadeep Varanasi, MD  Primary Care: None on file Primary Cardiologist: Jayadeep Varanasi, MD   Reason for consult: Multivessel coronary artery disease acute non-ST elevation myocardial infarction, acute ventilator dependent respiratory failure.   History of Present Illness:     Ms. Amiria Baylock is a 60-year-old female with a past history of peripheral arterial disease status post bilateral carotid artery stenting several years ago, hypertension, dyslipidemia, tobacco abuse with COPD, and type 2 diabetes mellitus, and severe, chronic irritable bowel syndrome who presented to  [facility] emergency room on 01/12/2021 via EMS for management of acute respiratory failure and unresponsiveness.  The EMS service reported that on their arrival to her home, she had agonal respirations and was severely hypoxic.  She was supported with a King airway in route to the hospital.  Shortly after arrival to the emergency room, she was intubated and started on mechanical ventilation.  Initial EKG showed ST depressions in the V leads.  Serial troponins peaked at around 3000.  Cardiology was consulted.  Additional work-up obtained shortly after presentation included chest x-ray that showed diffuse interstitial and airspace opacities consistent with pulmonary edema.  CT scan of the head was negative for any acute intracranial abnormality.  CTA of the chest was negative for pulmonary embolus.  Pulmonary edema was confirmed and Multi lobar pneumonia was suspected.  In addition, there was evidence of coronary calcification in all 3 main coronary artery distributions.  There was some bilateral hilar adenopathy noted on the CTA as well.  Ms. Mahoney was admitted to the hospital and started on empiric vancomycin and cefepime.   This was later converted to doxycycline and Rocephin.  She was started on a heparin infusion for non-ST elevation MI.  She was weaned from the ventilator and extubated midmorning yesterday.  She was initially stable but later in the day, she developed respiratory distress once again and was reintubated and placed back on mechanical ventilation.  An echocardiogram was obtained showing an estimated ejection fraction of 60 to 65%.  There was no significant valvular pathology.  Ms. Stines's cardiologist, Dr. Alexander Parachos, felt she was having flash pulmonary edema related to her acute MI.  She was taken to the Cath Lab urgently last evening where left heart catheterization was performed demonstrating severe three-vessel coronary artery disease with an 80% distal left main stenosis, 95% ostial LAD stenosis, and 95% stenosis of the ostial circumflex coronary artery.  The right coronary artery was occluded proximally.  There was mildly reduced left ventricular function noted with estimated left ventricular ejection fraction of 40% with anteroapical and inferolateral apical hypokinesis.  Intra-aortic balloon pump was placed in the Cath Lab. Ms. Blanford was transferred to [facility] last night for additional evaluation and management of her severe three-vessel coronary artery disease. The critical care medicine team has been consulted for management of mechanical ventilation.  Ms. Mcquiston remains sedated and intubated.  The most recent blood gas obtained at 3 AM today showed a pH of 7.32, PCO2 52.9 PO2 100 bicarbonate 27.8 base excess of 1.0. CT surgery has been asked to evaluate Ms. Flinchum for consideration of coronary bypass grafting after presenting with acute non-ST elevation myocardial infarction with pulmonary edema   and possible multi lobar pneumonia. Ms. Candy is currently resting in bed in the ICU.  She is sedated with fentanyl and propofol but awakens to voice and responds appropriately.  She  denies having any pain currently. Ms. Denicola's husband is at the bedside and answered questions regarding her history.  He tells me that she is on disability for longstanding severe irritable bowel syndrome.  He said she has been notably more fatigued with minimal activity for about a year.       Current Activity/ Functional Status:  Zubrod Score: At the time of surgery this patient's most appropriate activity status/level should be described as: []    0    Normal activity, no symptoms []    1    Restricted in physical strenuous activity but ambulatory, able to do out light work []    2    Ambulatory and capable of self care, unable to do work activities, up and about                 more than 50%  Of the time                            []    3    Only limited self care, in bed greater than 50% of waking hours [x]    4    Completely disabled, no self care, confined to bed or chair []    5    Moribund  Past Medical History:  Diagnosis Date   Anxiety    Bilateral carotid artery stenosis 2014   Chronic kidney disease 2017   Current smoker    CVA (cerebral vascular accident) (HCC) 2013   Depression    Diverticulosis    Fatty liver    Fibromyalgia    GERD (gastroesophageal reflux disease)    Hiatal hernia    Hypercholesteremia    Hypertension    IBS (irritable bowel syndrome)    PAD (peripheral artery disease) (HCC)    Peptic ulcer    T2DM (type 2 diabetes mellitus) (HCC)       Social History   Tobacco Use  Smoking Status Not on file  Smokeless Tobacco Not on file    Social History   Substance and Sexual Activity  Alcohol Use Not on file     Allergies  Allergen Reactions   Simvastatin     Diarrhea, nausea,vomiting   Milk-Related Compounds     diarrhea   Morphine And Related Itching   Latex     Current Facility-Administered Medications  Medication Dose Route Frequency Provider Last Rate Last Admin   acetaminophen (TYLENOL) 160 MG/5ML solution 650 mg  650 mg  Per Tube Q6H PRN Meyer, Andrew D, RPH   650 mg at 01/14/21 1054   albuterol (PROVENTIL) (2.5 MG/3ML) 0.083% nebulizer solution 2.5 mg  2.5 mg Nebulization Q4H PRN George, Timothy S, NP       arformoterol (BROVANA) nebulizer solution 15 mcg  15 mcg Nebulization BID Clark, Laura P, DO       aspirin EC tablet 81 mg  81 mg Oral Daily Narcisse, Dennis Jr., MD   81 mg at 01/14/21 0955   atorvastatin (LIPITOR) tablet 80 mg  80 mg Oral Daily Narcisse, Dennis Jr., MD   80 mg at 01/14/21 0956   cefTRIAXone (ROCEPHIN) 2 g in sodium chloride 0.9 % 100 mL IVPB  2 g Intravenous Q24H George, Timothy S, NP     Stopped at 01/14/21 0720   chlorhexidine gluconate (MEDLINE KIT) (PERIDEX) 0.12 % solution 15 mL  15 mL Mouth Rinse BID Acharya, Gayatri A, MD   15 mL at 01/14/21 0857   Chlorhexidine Gluconate Cloth 2 % PADS 6 each  6 each Topical Daily Acharya, Gayatri A, MD       docusate sodium (COLACE) capsule 100 mg  100 mg Oral BID PRN Narcisse, Dennis Jr., MD       doxycycline (VIBRAMYCIN) 100 mg in sodium chloride 0.9 % 250 mL IVPB  100 mg Intravenous Q12H George, Timothy S, NP   Stopped at 01/14/21 0641   fentaNYL 2500mcg in NS 250mL (10mcg/ml) infusion-PREMIX  50-200 mcg/hr Intravenous Continuous Narcisse, Dennis Jr., MD 15 mL/hr at 01/14/21 1100 150 mcg/hr at 01/14/21 1100   furosemide (LASIX) injection 40 mg  40 mg Intravenous Q8H Clark, Laura P, DO       heparin ADULT infusion 100 units/mL (25000 units/250mL)  1,400 Units/hr Intravenous Continuous Bryk, Veronda P, RPH 14 mL/hr at 01/14/21 1100 1,400 Units/hr at 01/14/21 1100   insulin aspart (novoLOG) injection 0-15 Units  0-15 Units Subcutaneous Q4H Acharya, Gayatri A, MD   5 Units at 01/14/21 1149   insulin aspart (novoLOG) injection 2 Units  2 Units Subcutaneous Q4H Clark, Laura P, DO   2 Units at 01/14/21 1150   insulin glargine-yfgn (SEMGLEE) injection 15 Units  15 Units Subcutaneous Daily Clark, Laura P, DO   15 Units at 01/14/21 1149   ipratropium-albuterol  (DUONEB) 0.5-2.5 (3) MG/3ML nebulizer solution 3 mL  3 mL Nebulization Q6H George, Timothy S, NP   3 mL at 01/14/21 0732   labetalol (NORMODYNE) injection 10 mg  10 mg Intravenous Q2H PRN Varanasi, Jayadeep S, MD       magnesium sulfate IVPB 2 g 50 mL  2 g Intravenous Once Clark, Laura P, DO       MEDLINE mouth rinse  15 mL Mouth Rinse 10 times per day Acharya, Gayatri A, MD   15 mL at 01/14/21 1159   metoprolol tartrate (LOPRESSOR) tablet 12.5 mg  12.5 mg Oral BID Varanasi, Jayadeep S, MD   12.5 mg at 01/14/21 0956   nitroGLYCERIN 50 mg in dextrose 5 % 250 mL (0.2 mg/mL) infusion  0-200 mcg/min Intravenous Titrated Narcisse, Dennis Jr., MD 27 mL/hr at 01/14/21 1100 90 mcg/min at 01/14/21 1100   pantoprazole (PROTONIX) injection 40 mg  40 mg Intravenous Daily George, Timothy S, NP   40 mg at 01/14/21 0955   polyethylene glycol (MIRALAX / GLYCOLAX) packet 17 g  17 g Oral Daily PRN Narcisse, Dennis Jr., MD       potassium chloride (KLOR-CON) packet 40 mEq  40 mEq Per Tube Once Clark, Laura P, DO       propofol (DIPRIVAN) 1000 MG/100ML infusion  5-50 mcg/kg/min Intravenous Titrated Narcisse, Dennis Jr., MD 27.5 mL/hr at 01/14/21 1119 50 mcg/kg/min at 01/14/21 1119   revefenacin (YUPELRI) nebulizer solution 175 mcg  175 mcg Nebulization Daily Clark, Laura P, DO        Medications Prior to Admission  Medication Sig Dispense Refill Last Dose   aspirin EC 81 MG tablet Take 81 mg by mouth daily. Swallow whole.      clopidogrel (PLAVIX) 75 MG tablet Take 75 mg by mouth daily.      dicyclomine (BENTYL) 10 MG capsule Take 10 mg by mouth 3 (three) times daily before meals.      FLUoxetine (PROZAC) 40 MG capsule   Take 80 mg by mouth daily.      gabapentin (NEURONTIN) 600 MG tablet Take 600 mg by mouth 3 (three) times daily.      gemfibrozil (LOPID) 600 MG tablet Take 600 mg by mouth 2 (two) times daily.      hydrochlorothiazide (HYDRODIURIL) 12.5 MG tablet Take 12.5 mg by mouth daily.      insulin degludec  (TRESIBA FLEXTOUCH) 200 UNIT/ML FlexTouch Pen Inject 80 Units into the skin at bedtime.      metoprolol succinate (TOPROL-XL) 50 MG 24 hr tablet Take 50 mg by mouth daily.      NOVOLOG FLEXPEN 100 UNIT/ML FlexPen 20-26 Units 3 (three) times daily with meals.      rosuvastatin (CRESTOR) 10 MG tablet Take 10 mg by mouth daily.      tiZANidine (ZANAFLEX) 4 MG tablet Take 4 mg by mouth 2 (two) times daily as needed for muscle spasms (pain).      traMADol (ULTRAM) 50 MG tablet Take 50 mg by mouth 3 (three) times daily as needed for moderate pain.       No family history on file.   Review of Systems:   ROS --unable to complete review of systems since patient is sedated and intubated      Cardiac Review of Systems: Y or  [    ]= no  Chest Pain [    ]  Resting SOB [   ] Exertional SOB  [  ]  Orthopnea [  ]   Pedal Edema [   ]    Palpitations [  ] Syncope  [  ]   Presyncope [   ]  General Review of Systems: [Y] = yes [  ]=no Constitional: recent weight change [  ]; anorexia [  ]; fatigue [  ]; nausea [  ]; night sweats [  ]; fever [  ]; or chills [  ]                                                               Dental: Last Dentist visit: Within the past 6 months  Eye : blurred vision [  ]; diplopia [   ]; vision changes [  ];  Amaurosis fugax[  ]; Resp: cough [  ];  wheezing[  ];  hemoptysis[  ]; shortness of breath[  ]; paroxysmal nocturnal dyspnea[  ]; dyspnea on exertion[  ]; or orthopnea[  ];  GI:  gallstones[  ], vomiting[  ];  dysphagia[  ]; melena[  ];  hematochezia [  ]; heartburn[  ];   Hx of  Colonoscopy[  ]; GU: kidney stones [  ]; hematuria[  ];   dysuria [  ];  nocturia[  ];  history of     obstruction [  ]; urinary frequency [  ]             Skin: rash, swelling[  ];, hair loss[  ];  peripheral edema[  ];  or itching[  ]; Musculosketetal: myalgias[  ];  joint swelling[  ];  joint erythema[  ];  joint pain[  ];  back pain[  ];  Heme/Lymph: bruising[  ];  bleeding[  ];  anemia[  ];   Neuro: TIA[  ];  headaches[  ];    stroke[  ];  vertigo[  ];  seizures[  ];   paresthesias[  ];  difficulty walking[  ];  Psych:depression[  ]; anxiety[  ];  Endocrine: diabetes[ x ];  thyroid dysfunction[  ];                 Physical Exam: BP (!) 119/46 (BP Location: Right Wrist)   Pulse (!) 102   Temp (!) 101 F (38.3 C) (Axillary)   Resp 20   Wt 86.8 kg   SpO2 95%   BMI 30.89 kg/m    General appearance: Resting in reverse Trendelenburg position in the ICU.  An aortic balloon pump is counterpulsation at one-to-one.  She has IV fentanyl and propofol infusing but she awakens, responds appropriately, and moves extremities. Head: Normocephalic, without obvious abnormality, atraumatic Neck: no adenopathy, no carotid bruit, no JVD, supple, symmetrical, trachea midline, and thyroid not enlarged, symmetric, no tenderness/mass/nodules Lymph nodes: No cervical or clavicular adenopathy Resp: Breath sounds are distant but clear to auscultation throughout. Cardio: Regular rhythm, monitor shows sinus tachycardia.  There is no murmur but she has a pericardial rub heard along the left sternal border. GI: Soft, no apparent tenderness.  Bowel sounds are hypoactive. Extremities: No obvious deformity.  Bilateral radial pulses are easily palpable.  She has a palpable right dorsalis pedis pulse.  Both feet are warm and well-perfused with brisk capillary refill.  The intra-aortic balloon pump exits the right femoral artery. Neurologic: Unable to fully assess due to IV sedation with fentanyl and propofol.  She does open her eyes to voice and responds appropriately.  She is able to move extremities.  Diagnostic Studies & Laboratory data:     Recent Radiology Findings:   DG Abdomen 1 View  Result Date: 01/12/2021 CLINICAL DATA:  Status post OG tube placement. EXAM: ABDOMEN - 1 VIEW COMPARISON:  September 30, 2010 FINDINGS: An orogastric tube is seen with its distal tip noted within the body of the stomach.  The distal side hole sits approximately 5.3 cm distal to the expected region of the gastroesophageal junction. The bowel gas pattern is normal. Radiopaque surgical clips are noted within the right upper quadrant. No radio-opaque calculi or other significant radiographic abnormality are seen. Radiopaque pedicle screws are present within the visualized portion of the lower lumbar spine. IMPRESSION: Orogastric tube positioning, as described above. Electronically Signed   By: Thaddeus  Houston M.D.   On: 01/12/2021 21:35   CT HEAD WO CONTRAST (5MM)  Result Date: 01/12/2021 CLINICAL DATA:  Mental status change.  Unknown cause EXAM: CT HEAD WITHOUT CONTRAST TECHNIQUE: Contiguous axial images were obtained from the base of the skull through the vertex without intravenous contrast. COMPARISON:  CT head 02/03/2019 BRAIN: BRAIN Cerebral ventricle sizes are concordant with the degree of cerebral volume loss. Patchy and confluent areas of decreased attenuation are noted throughout the deep and periventricular white matter of the cerebral hemispheres bilaterally, compatible with chronic microvascular ischemic disease. No evidence of large-territorial acute infarction. No parenchymal hemorrhage. No mass lesion. No extra-axial collection. No mass effect or midline shift. No hydrocephalus. Basilar cisterns are patent. Vascular: No hyperdense vessel. Atherosclerotic calcifications are present within the cavernous internal carotid arteries. Skull: No acute fracture or focal lesion. Sinuses/Orbits: Paranasal sinuses and mastoid air cells are clear. The orbits are unremarkable. Other: None. IMPRESSION: No acute intracranial abnormality. Electronically Signed   By: Morgane  Naveau M.D.   On: 01/12/2021 22:09   CT Angio Chest PE W and/or Wo Contrast    Result Date: 01/12/2021 CLINICAL DATA:  Chest pain and shortness of breath. EXAM: CT ANGIOGRAPHY CHEST WITH CONTRAST TECHNIQUE: Multidetector CT imaging of the chest was performed  using the standard protocol during bolus administration of intravenous contrast. Multiplanar CT image reconstructions and MIPs were obtained to evaluate the vascular anatomy. CONTRAST:  75mL OMNIPAQUE IOHEXOL 350 MG/ML SOLN COMPARISON:  Chest radiograph dated 01/12/2021. FINDINGS: Evaluation of this exam is limited due to respiratory motion artifact. Cardiovascular: Borderline cardiomegaly. No pericardial effusion. Advanced 3 vessel coronary vascular calcification. Moderate atherosclerotic calcification of the thoracic aorta. No aneurysmal dilatation. Evaluation of the pulmonary arteries is very limited due to respiratory motion artifact. No large or central pulmonary artery embolus identified. Mediastinum/Nodes: Bilateral hilar adenopathy measures 17 mm in short axis on the right. An enteric tube is noted within the esophagus. No mediastinal fluid collection. Lungs/Pleura: Diffuse interstitial and interlobular septal prominence consistent with edema. Diffuse ground-glass and nodular densities throughout the lungs with patchy areas of consolidation bilaterally and predominantly involving the lower lobes concerning for superimposed pneumonia. Clinical correlation is recommended. Small bilateral pleural effusions. No pneumothorax. Endotracheal tube with tip 4 cm above the carina. The central airways are patent. Upper Abdomen: Probable fatty liver. Musculoskeletal: No acute osseous pathology. Review of the MIP images confirms the above findings. IMPRESSION: 1. No CT evidence of central pulmonary artery embolus. 2. Pulmonary edema with probable multilobar pneumonia. Clinical correlation is recommended. 3. Bilateral hilar adenopathy, likely reactive. 4. Aortic Atherosclerosis (ICD10-I70.0). Electronically Signed   By: Arash  Radparvar M.D.   On: 01/12/2021 22:13   Procedures  Coronary/Graft Acute MI Revascularization  IABP Insertion  LEFT HEART CATH AND CORONARY ANGIOGRAPHY   Conclusion      Prox RCA lesion is  100% stenosed.   Dist LM to Ost LAD lesion is 95% stenosed.   Ost Cx lesion is 95% stenosed.   Mid Cx lesion is 70% stenosed.   Mid LAD lesion is 75% stenosed.   Mid LM to Dist LM lesion is 80% stenosed.   The left ventricular ejection fraction is 35-45% by visual estimate.   1.  Non-ST elevation myocardial infarction 2.  Three-vessel coronary artery disease with 80% distal left main, 95% stenosis ostial LAD, 95% stenosis ostial left circumflex, occluded proximal RCA 3.  Mildly reduced left ventricular function with estimated LV ejection fraction 40% with anteroapical and inferoapical hypokinesis 4.  Intra-aortic balloon pump placed   Recommendations   1.  Transfer to [facility] for urgent coronary artery bypass graft surgery  Coronary Findings  Diagnostic Dominance: Right Left Main  Mid LM to Dist LM lesion is 80% stenosed. The lesion is discrete. The lesion is moderately calcified.  Dist LM to Ost LAD lesion is 95% stenosed. The lesion is discrete. The lesion is moderately calcified.    Left Anterior Descending  Mid LAD lesion is 75% stenosed. The lesion is tubular.    Left Circumflex  Ost Cx lesion is 95% stenosed. The lesion is discrete. The lesion is calcified.  Mid Cx lesion is 70% stenosed. The lesion is tubular.    Right Coronary Artery  Prox RCA lesion is 100% stenosed.    Right Posterior Descending Artery  Collaterals  RPDA filled by collaterals from Dist LAD.      Intervention   No interventions have been documented.   Wall Motion              Left Heart  Left Ventricle The left ventricular ejection fraction is 35-45% by   visual estimate.  Aorta Descending Aorta:  The descending aorta is normal in size.   Abdominal Aorta:  The abdominal aorta is normal in size.   Coronary Diagrams   Diagnostic Dominance: Right       ECHOCARDIOGRAM REPORT         Patient Name:   Shantia Enwright Date of Exam: 01/13/2021  Medical Rec #:   [id number]    Height:       66.0 in  Accession #:    [id number]   Weight:       202.4 lb  Date of Birth:  12/18/1960    BSA:          2.010 m  Patient Age:    60 years     BP:           112/65 mmHg  Patient Gender: F            HR:           114 bpm.  Exam Location:  [facility]   Procedure: 2D Echo, Color Doppler and Cardiac Doppler   Indications:     I42.9 Cardiomyopathy-unspecified     History:         Patient has no prior history of Echocardiogram  examinations.                   CKD and Stroke; Risk Factors:Current Smoker, Hypertension  and                   Diabetes.     Sonographer:     Joan Heiss  Referring Phys:  [id number] BRITTON L R[place]CHESTER  Diagnosing Phys: Timothy Gollan MD      Sonographer Comments: Echo performed with patient supine and on artificial  respirator.  IMPRESSIONS     1. Left ventricular ejection fraction, by estimation, is 60 to 65%. The  left ventricle has normal function. The left ventricle has no regional  wall motion abnormalities. Left ventricular diastolic parameters are  consistent with Grade I diastolic  dysfunction (impaired relaxation).   2. Right ventricular systolic function is normal. The right ventricular  size is normal. Tricuspid regurgitation signal is inadequate for assessing  PA pressure.   3. The mitral valve is normal in structure. Mild mitral valve  regurgitation. No evidence of mitral stenosis.   4. The aortic valve is normal in structure. Aortic valve regurgitation is  not visualized. Aortic valve sclerosis/calcification is present, without  any evidence of aortic stenosis.   5. The inferior vena cava is dilated in size with >50% respiratory  variability, suggesting right atrial pressure of 8 mmHg.   FINDINGS   Left Ventricle: Left ventricular ejection fraction, by estimation, is 60  to 65%. The left ventricle has normal function. The left ventricle has no  regional wall motion abnormalities. The left ventricular internal  cavity  size was normal in size. There is   borderline left ventricular hypertrophy. Left ventricular diastolic  parameters are consistent with Grade I diastolic dysfunction (impaired  relaxation).   Right Ventricle: The right ventricular size is normal. No increase in  right ventricular wall thickness. Right ventricular systolic function is  normal. Tricuspid regurgitation signal is inadequate for assessing PA  pressure.   Left Atrium: Left atrial size was normal in size.   Right Atrium: Right atrial size was normal in size.   Pericardium: There is no evidence of pericardial effusion.   Mitral Valve: The mitral valve is normal in structure.   Mild mitral valve  regurgitation. No evidence of mitral valve stenosis. MV peak gradient, 9.1  mmHg. The mean mitral valve gradient is 5.0 mmHg.   Tricuspid Valve: The tricuspid valve is normal in structure. Tricuspid  valve regurgitation is mild . No evidence of tricuspid stenosis.   Aortic Valve: The aortic valve is normal in structure. Aortic valve  regurgitation is not visualized. Aortic valve sclerosis/calcification is  present, without any evidence of aortic stenosis. Aortic valve mean  gradient measures 9.0 mmHg. Aortic valve peak   gradient measures 14.9 mmHg. Aortic valve area, by VTI measures 2.60 cm.   Pulmonic Valve: The pulmonic valve was normal in structure. Pulmonic valve  regurgitation is not visualized. No evidence of pulmonic stenosis.   Aorta: The aortic root is normal in size and structure.   Venous: The inferior vena cava is dilated in size with greater than 50%  respiratory variability, suggesting right atrial pressure of 8 mmHg.   IAS/Shunts: No atrial level shunt detected by color flow Doppler.      LEFT VENTRICLE  PLAX 2D  LVIDd:         5.40 cm   Diastology  LVIDs:         3.80 cm   LV e' medial:    6.20 cm/s  LV PW:         1.10 cm   LV E/e' medial:  17.4  LV IVS:        0.90 cm   LV e' lateral:   5.22  cm/s  LVOT diam:     2.20 cm   LV E/e' lateral: 20.7  LV SV:         85  LV SV Index:   42  LVOT Area:     3.80 cm      RIGHT VENTRICLE  RV Basal diam:  2.70 cm  RV S prime:     22.60 cm/s   LEFT ATRIUM           Index        RIGHT ATRIUM           Index  LA diam:      4.20 cm 2.09 cm/m   RA Area:     13.80 cm  LA Vol (A2C): 37.6 ml 18.71 ml/m  RA Volume:   33.90 ml  16.87 ml/m  LA Vol (A4C): 37.3 ml 18.56 ml/m   AORTIC VALVE                     PULMONIC VALVE  AV Area (Vmax):    2.42 cm      PV Vmax:       1.74 m/s  AV Area (Vmean):   2.39 cm      PV Vmean:      111.000 cm/s  AV Area (VTI):     2.60 cm      PV VTI:        0.281 m  AV Vmax:           193.00 cm/s   PV Peak grad:  12.1 mmHg  AV Vmean:          146.000 cm/s  PV Mean grad:  6.0 mmHg  AV VTI:            0.327 m  AV Peak Grad:      14.9 mmHg  AV Mean Grad:      9.0 mmHg  LVOT Vmax:           123.00 cm/s  LVOT Vmean:        91.900 cm/s  LVOT VTI:          0.224 m  LVOT/AV VTI ratio: 0.69     AORTA  Ao Root diam: 3.20 cm   MITRAL VALVE  MV Area (PHT): 7.29 cm     SHUNTS  MV Area VTI:   3.45 cm     Systemic VTI:  0.22 m  MV Peak grad:  9.1 mmHg     Systemic Diam: 2.20 cm  MV Mean grad:  5.0 mmHg  MV Vmax:       1.51 m/s  MV Vmean:      105.0 cm/s  MV Decel Time: 104 msec  MV E velocity: 108.00 cm/s  MV A velocity: 121.00 cm/s  MV E/A ratio:  0.89   Timothy Gollan MD  Electronically signed by Timothy Gollan MD  Signature Date/Time: 01/13/2021/1:42:11 PM       DG CHEST PORT 1 VIEW  Result Date: 01/14/2021 CLINICAL DATA:  Initial evaluation for acute respiratory failure with hypoxia. EXAM: PORTABLE CHEST 1 VIEW COMPARISON:  Radiograph from 01/12/2021 FINDINGS: Endotracheal tube in place with tip at the superior margin of the clavicles. Diffuse related pad overlies the left chest. Stable cardiomegaly. Mediastinal silhouette within normal limits. Lungs mildly hypoinflated. Persistent bilateral  interstitial and airspace disease, most pronounced at the right lung base. Overall appearance is improved, suspected to reflect improved edema. Probable small bilateral pleural effusions. No pneumothorax. Osseous structures are unchanged. IMPRESSION: 1. Tip of the endotracheal tube at the superior margin of the clavicles. 2. Persistent bilateral airspace disease, most pronounced at the right lung base. Overall appearance is mildly improved from prior, favored to reflect improved edema. Superimposed infection/infiltrates not excluded. 3. Probable small bilateral pleural effusions. Electronically Signed   By: Benjamin  McClintock M.D.   On: 01/14/2021 02:37   DG Chest Portable 1 View  Result Date: 01/12/2021 CLINICAL DATA:  et tube placement.  SOB EXAM: PORTABLE CHEST 1 VIEW COMPARISON:  None. FINDINGS: Endotracheal tube with tip terminating 6 cm above the carina. Enteric tube coursing below the hemidiaphragm with tip and side port collimated off view. Lines and tubes as well as cardiac paddles overlie the chest. The heart and mediastinal contours are within normal limits. Increased interstitial markings and diffuse patchy airspace opacities. No pleural effusion. No pneumothorax. No acute osseous abnormality. IMPRESSION: 1. Diffuse interstitial and airspace opacities suggestive of pulmonary edema. Superimposed infection/inflammation not excluded. 2. Endotracheal tube in good position. 3. Enteric tube coursing below the hemidiaphragm with tip and side port collimated off view. Electronically Signed   By: Morgane  Naveau M.D.   On: 01/12/2021 21:34   DG Abd Portable 1V  Result Date: 01/14/2021 CLINICAL DATA:  Encounter for orogastric tube EXAM: PORTABLE ABDOMEN - 1 VIEW COMPARISON:  Chest CT 2 days ago FINDINGS: Enteric tube with tip and side port at the stomach. Artifact from EKG leads. There may be an aortic balloon pump with marker 4.4 cm below the aortic knob, likely accentuated by abdominal projection.  Diffuse patchy airspace density. Bowel gas pattern is nonobstructive. Renal contrast from prior CT. IMPRESSION: 1. Enteric tube with tip and side port at the stomach. 2. Aortic balloon pump with marker approximately 4 cm below the aortic knob. Electronically Signed   By: Jonathan  Watts M.D.   On: 01/14/2021 04:46     I have independently reviewed the above radiologic studies and discussed with the patient     Recent Lab Findings: Lab Results  Component Value Date   WBC 17.6 (H) 01/14/2021   HGB 10.9 (L) 01/14/2021   HCT 32.0 (L) 01/14/2021   PLT 296 01/14/2021   GLUCOSE 216 (H) 01/14/2021   TRIG 187 (H) 01/13/2021   ALT 24 01/12/2021   AST 39 01/12/2021   NA 135 01/14/2021   K 4.2 01/14/2021   CL 101 01/14/2021   CREATININE 0.91 01/14/2021   BUN 24 (H) 01/14/2021   CO2 26 01/14/2021   INR 1.2 01/12/2021   HGBA1C 6.6 (H) 01/12/2021      Assessment / Plan:      -60-year-old female with multiple risk factors including tobacco use, type 2 diabetes, and dyslipidemia was brought to [facility] by EMS with acute respiratory failure requiring intubation 2 days ago.  She was diuresed and treated empirically with antibiotics for suspected multilobar pneumonia.  She was weaned from the ventilator and extubated but within a few hours required reintubation for flash pulmonary edema.  She was taken to the Cath Lab last evening for left heart catheterization which demonstrated severe three-vessel coronary artery disease detailed above.  She subsequently had a right femoral intra-aortic balloon pump placed.  She is currently stable in the ICU.  Left heart catheterization demonstrated severe three-vessel coronary artery disease as detailed in the report above.  Artery artery bypass grafting is her best option for revascularization.  She is presently stable with aortic balloon pump catheter pulsating at one-to-one and on heparin infusion and she appears to have no compromise in distal  perfusion.. The ABG obtained earlier this morning was continuing to show a significant respiratory acidosis although she is now on an FiO2 of 0.4, PEEP of 8, and rate of 20 with oxygen saturation of 96%.  Chest x-ray obtained earlier this morning was also showing diffuse patchy airspace densities.  Dr. Boston Catarino will review clinical data and angiography and discussion regarding timing of surgery will follow.  -Type 2 diabetes mellitus-managed with NovoLog insulin prior to admission  -Chronic obstructive pulmonary disease  -Peripheral arterial disease-she is status post bilateral carotid artery stenting and the nurse reported to me that she recently had a stent placed in her left lower extremity.  She was taking Plavix prior to admission.   I  spent 30 minutes counseling the patient face to face.   Myron G. Roddenberry, PA-C  01/14/2021 12:17 PM  I have seen and examined Mrs. Price and talked with her husband.  She is a 60-year-old woman with multiple cardiac risk factors and known PAD.  She presented with acute respiratory failure.  Ruled in for MI.  Catheterization has left main and severe three-vessel coronary disease.  Intra-aortic balloon pump placed for perfusion.  Chest x-ray shows pulmonary edema and diffuse patchy infiltrates.  Being treated empirically for pneumonia with ceftriaxone.  Also being diuresed.  She does open her eyes but does not follow commands.  She does move her extremities spontaneously and somewhat purposefully.  Not a candidate for CABG currently.  We need a better assessment of her mental status and also improvement of her pulmonary status prior to bypass surgery.  Hopefully she will progress to the point we can do surgery later this week.  Will follow-up.  Brenisha Tsui C. Malita Ignasiak, MD [facility] [phone]    

## 2021-01-15 ENCOUNTER — Inpatient Hospital Stay (HOSPITAL_COMMUNITY): Payer: Medicare Other

## 2021-01-15 ENCOUNTER — Other Ambulatory Visit (HOSPITAL_COMMUNITY): Payer: Medicare Other

## 2021-01-15 DIAGNOSIS — J810 Acute pulmonary edema: Secondary | ICD-10-CM

## 2021-01-15 DIAGNOSIS — I25110 Atherosclerotic heart disease of native coronary artery with unstable angina pectoris: Secondary | ICD-10-CM

## 2021-01-15 DIAGNOSIS — G9340 Encephalopathy, unspecified: Secondary | ICD-10-CM | POA: Diagnosis not present

## 2021-01-15 DIAGNOSIS — I214 Non-ST elevation (NSTEMI) myocardial infarction: Secondary | ICD-10-CM | POA: Diagnosis not present

## 2021-01-15 DIAGNOSIS — J9601 Acute respiratory failure with hypoxia: Secondary | ICD-10-CM | POA: Diagnosis not present

## 2021-01-15 DIAGNOSIS — R569 Unspecified convulsions: Secondary | ICD-10-CM

## 2021-01-15 DIAGNOSIS — Z01810 Encounter for preprocedural cardiovascular examination: Secondary | ICD-10-CM

## 2021-01-15 DIAGNOSIS — R570 Cardiogenic shock: Secondary | ICD-10-CM | POA: Diagnosis not present

## 2021-01-15 LAB — GLUCOSE, CAPILLARY
Glucose-Capillary: 167 mg/dL — ABNORMAL HIGH (ref 70–99)
Glucose-Capillary: 185 mg/dL — ABNORMAL HIGH (ref 70–99)
Glucose-Capillary: 191 mg/dL — ABNORMAL HIGH (ref 70–99)
Glucose-Capillary: 199 mg/dL — ABNORMAL HIGH (ref 70–99)
Glucose-Capillary: 210 mg/dL — ABNORMAL HIGH (ref 70–99)
Glucose-Capillary: 224 mg/dL — ABNORMAL HIGH (ref 70–99)
Glucose-Capillary: 227 mg/dL — ABNORMAL HIGH (ref 70–99)
Glucose-Capillary: 276 mg/dL — ABNORMAL HIGH (ref 70–99)

## 2021-01-15 LAB — PHOSPHORUS: Phosphorus: 4.4 mg/dL (ref 2.5–4.6)

## 2021-01-15 LAB — BASIC METABOLIC PANEL
Anion gap: 8 (ref 5–15)
Anion gap: 8 (ref 5–15)
BUN: 26 mg/dL — ABNORMAL HIGH (ref 6–20)
BUN: 27 mg/dL — ABNORMAL HIGH (ref 6–20)
CO2: 26 mmol/L (ref 22–32)
CO2: 26 mmol/L (ref 22–32)
Calcium: 8.4 mg/dL — ABNORMAL LOW (ref 8.9–10.3)
Calcium: 8.5 mg/dL — ABNORMAL LOW (ref 8.9–10.3)
Chloride: 97 mmol/L — ABNORMAL LOW (ref 98–111)
Chloride: 102 mmol/L (ref 98–111)
Creatinine, Ser: 0.97 mg/dL (ref 0.44–1.00)
Creatinine, Ser: 1.07 mg/dL — ABNORMAL HIGH (ref 0.44–1.00)
GFR, Estimated: >60 mL/min (ref >60)
GFR, Estimated: 60 mL/min — ABNORMAL LOW (ref >60)
Glucose, Bld: 214 mg/dL — ABNORMAL HIGH (ref 70–99)
Glucose, Bld: 213 mg/dL — ABNORMAL HIGH (ref 70–99)
Potassium: 4.4 mmol/L (ref 3.5–5.1)
Potassium: 4.3 mmol/L (ref 3.5–5.1)
Sodium: 131 mmol/L — ABNORMAL LOW (ref 135–145)
Sodium: 136 mmol/L (ref 135–145)

## 2021-01-15 LAB — POCT I-STAT 7, (LYTES, BLD GAS, ICA,H+H)
Acid-Base Excess: 1 mmol/L (ref 0.0–2.0)
Bicarbonate: 27 mmol/L (ref 20.0–28.0)
Calcium, Ion: 1.2 mmol/L (ref 1.15–1.40)
HCT: 26 % — ABNORMAL LOW (ref 36.0–46.0)
Hemoglobin: 8.8 g/dL — ABNORMAL LOW (ref 12.0–15.0)
O2 Saturation: 95 %
Patient temperature: 100.7
Potassium: 4.4 mmol/L (ref 3.5–5.1)
Sodium: 136 mmol/L (ref 135–145)
TCO2: 29 mmol/L (ref 22–32)
pCO2 arterial: 54.1 mmHg — ABNORMAL HIGH (ref 32.0–48.0)
pH, Arterial: 7.312 — ABNORMAL LOW (ref 7.350–7.450)
pO2, Arterial: 90 mmHg (ref 83.0–108.0)

## 2021-01-15 LAB — CULTURE, RESPIRATORY W GRAM STAIN

## 2021-01-15 LAB — MAGNESIUM: Magnesium: 2.3 mg/dL (ref 1.7–2.4)

## 2021-01-15 LAB — LIPID PANEL
Cholesterol: 161 mg/dL (ref 0–200)
HDL: 27 mg/dL — ABNORMAL LOW (ref >40)
LDL Cholesterol: UNDETERMINED mg/dL (ref 0–99)
Total CHOL/HDL Ratio: 6 RATIO
Triglycerides: 811 mg/dL — ABNORMAL HIGH (ref <150)
VLDL: UNDETERMINED mg/dL (ref 0–40)

## 2021-01-15 LAB — COOXEMETRY PANEL
Carboxyhemoglobin: 1.4 % (ref 0.5–1.5)
Methemoglobin: 1.5 % (ref 0.0–1.5)
O2 Saturation: 77.7 %
Total hemoglobin: 9.4 g/dL — ABNORMAL LOW (ref 12.0–16.0)

## 2021-01-15 LAB — CBC
HCT: 28.6 % — ABNORMAL LOW (ref 36.0–46.0)
Hemoglobin: 9.3 g/dL — ABNORMAL LOW (ref 12.0–15.0)
MCH: 27.8 pg (ref 26.0–34.0)
MCHC: 32.5 g/dL (ref 30.0–36.0)
MCV: 85.6 fL (ref 80.0–100.0)
Platelets: 239 10*3/uL (ref 150–400)
RBC: 3.34 MIL/uL — ABNORMAL LOW (ref 3.87–5.11)
RDW: 13.2 % (ref 11.5–15.5)
WBC: 15.3 10*3/uL — ABNORMAL HIGH (ref 4.0–10.5)
nRBC: 0 % (ref 0.0–0.2)

## 2021-01-15 LAB — HEPARIN LEVEL (UNFRACTIONATED)
Heparin Unfractionated: 0.13 IU/mL — ABNORMAL LOW (ref 0.30–0.70)
Heparin Unfractionated: 0.16 IU/mL — ABNORMAL LOW (ref 0.30–0.70)
Heparin Unfractionated: 0.18 IU/mL — ABNORMAL LOW (ref 0.30–0.70)

## 2021-01-15 LAB — LACTATE DEHYDROGENASE: LDH: 375 U/L — ABNORMAL HIGH (ref 98–192)

## 2021-01-15 LAB — PROCALCITONIN: Procalcitonin: 5.61 ng/mL

## 2021-01-15 LAB — BRAIN NATRIURETIC PEPTIDE: B Natriuretic Peptide: 77.3 pg/mL (ref 0.0–100.0)

## 2021-01-15 LAB — LDL CHOLESTEROL, DIRECT: Direct LDL: 55.5 mg/dL (ref 0–99)

## 2021-01-15 MED ORDER — DEXMEDETOMIDINE HCL IN NACL 400 MCG/100ML IV SOLN
INTRAVENOUS | Status: AC
  Filled 2021-01-15: qty 100

## 2021-01-15 MED ORDER — INSULIN ASPART 100 UNIT/ML IJ SOLN: 3.0000 [IU] | INTRAMUSCULAR | Status: DC

## 2021-01-15 MED ORDER — FENTANYL CITRATE PF 50 MCG/ML IJ SOSY
PREFILLED_SYRINGE | INTRAMUSCULAR | Status: AC
  Filled 2021-01-15: qty 1

## 2021-01-15 MED ORDER — ATORVASTATIN CALCIUM 80 MG PO TABS
80.0000 mg | ORAL_TABLET | Freq: Every day | ORAL | Status: DC
  Administered 2021-01-16 – 2021-01-25 (×10): 80 mg
  Filled 2021-01-15 (×10): qty 1

## 2021-01-15 MED ORDER — POLYETHYLENE GLYCOL 3350 17 G PO PACK: 17.0000 g | PACK | Freq: Every day | ORAL | Status: DC | PRN

## 2021-01-15 MED ORDER — FUROSEMIDE 10 MG/ML IJ SOLN
INTRAMUSCULAR | Status: AC
  Filled 2021-01-15: qty 2

## 2021-01-15 MED ORDER — FUROSEMIDE 10 MG/ML IJ SOLN
60.0000 mg | Freq: Three times a day (TID) | INTRAMUSCULAR | Status: AC
  Administered 2021-01-15 (×3): 60 mg via INTRAVENOUS
  Filled 2021-01-15: qty 6

## 2021-01-15 MED ORDER — FUROSEMIDE 10 MG/ML IJ SOLN
INTRAMUSCULAR | Status: AC
  Filled 2021-01-15: qty 4

## 2021-01-15 MED ORDER — DEXMEDETOMIDINE HCL IN NACL 400 MCG/100ML IV SOLN
0.0000 ug/kg/h | INTRAVENOUS | Status: DC
  Administered 2021-01-15: 1 ug/kg/h via INTRAVENOUS
  Administered 2021-01-15: 1.2 ug/kg/h via INTRAVENOUS
  Administered 2021-01-15: 0.5 ug/kg/h via INTRAVENOUS
  Administered 2021-01-16: 1 ug/kg/h via INTRAVENOUS
  Administered 2021-01-16: 0.5 ug/kg/h via INTRAVENOUS
  Administered 2021-01-16 (×2): 1.2 ug/kg/h via INTRAVENOUS
  Administered 2021-01-17 (×5): 1 ug/kg/h via INTRAVENOUS
  Administered 2021-01-18 (×2): 0.7 ug/kg/h via INTRAVENOUS
  Filled 2021-01-15 (×3): qty 100
  Filled 2021-01-15: qty 200
  Filled 2021-01-15 (×3): qty 100
  Filled 2021-01-15: qty 200
  Filled 2021-01-15: qty 100

## 2021-01-15 MED ORDER — INSULIN ASPART 100 UNIT/ML IJ SOLN
4.0000 [IU] | INTRAMUSCULAR | Status: DC
Start: 2021-01-15 — End: 2021-01-16
  Administered 2021-01-15 – 2021-01-16 (×6): 4 [IU] via SUBCUTANEOUS

## 2021-01-15 MED ORDER — METOPROLOL TARTRATE 25 MG/10 ML ORAL SUSPENSION
12.5000 mg | Freq: Two times a day (BID) | ORAL | Status: DC
  Administered 2021-01-16 – 2021-01-18 (×5): 12.5 mg
  Filled 2021-01-15 (×5): qty 5

## 2021-01-15 MED ORDER — ASPIRIN 81 MG PO CHEW
81.0000 mg | CHEWABLE_TABLET | Freq: Every day | ORAL | Status: DC
Start: 2021-01-16 — End: 2021-01-18
  Administered 2021-01-16 – 2021-01-18 (×3): 81 mg
  Filled 2021-01-15 (×3): qty 1

## 2021-01-15 MED ORDER — FENTANYL CITRATE PF 50 MCG/ML IJ SOSY
PREFILLED_SYRINGE | INTRAMUSCULAR | Status: AC
  Filled 2021-01-15: qty 2

## 2021-01-15 MED ORDER — REVEFENACIN 175 MCG/3ML IN SOLN
175.0000 ug | Freq: Every day | RESPIRATORY_TRACT | Status: DC
  Administered 2021-01-15 – 2021-01-18 (×4): 175 ug via RESPIRATORY_TRACT
  Filled 2021-01-15 (×4): qty 3

## 2021-01-15 NOTE — Plan of Care (Signed)
  Problem: Clinical Measurements: Goal: Ability to maintain clinical measurements within normal limits will improve Outcome: Progressing Goal: Diagnostic test results will improve Outcome: Progressing   Problem: Clinical Measurements: Goal: Respiratory complications will improve Outcome: Not Progressing

## 2021-01-15 NOTE — Progress Notes (Signed)
ANTICOAGULATION CONSULT NOTE - Follow Up Consult  Pharmacy Consult for heparin Indication:  IABP  Labs: Recent Labs    01/12/21 2107 01/12/21 2344 01/13/21 0402 01/13/21 0823 01/13/21 1256 01/13/21 1605 01/13/21 1827 01/14/21 0153 01/14/21 0307 01/14/21 1017 01/14/21 1930 01/14/21 2046 01/14/21 2226 01/15/21 0412  HGB 12.6  --  12.1  --   --   --   --  10.3* 10.9*  --   --   --  9.1* 9.3*  HCT 41.1  --  36.6  --   --   --   --  31.8* 32.0*  --   --   --  28.1* 28.6*  PLT 538*  --  434*  --   --   --   --  296  --   --   --   --  255 239  APTT 32  --   --   --   --   --   --   --   --   --   --   --   --   --   LABPROT 15.0  --   --   --   --   --   --   --   --   --   --   --   --   --   INR 1.2  --   --   --   --   --   --   --   --   --   --   --   --   --   HEPARINUNFRC  --   --   --    < >  --   --  <0.10* <0.10*  --  <0.10* <0.10*  --   --  0.13*  CREATININE 0.96  --  0.65  --   --   --   --  0.91  --   --   --  0.94  --   --   TROPONINIHS 321*   < > 2,772*  --  2,913* 2,587* 1,790*  --   --   --   --   --   --   --    < > = values in this interval not displayed.     Assessment: 60yo female subtherapeutic on heparin after arrival from [facility] cath lab w/ IABP placement, now awaiting CVTS consult.  Heparin level remains subtherapeutic (0.13) on 1800 units/hr. Hgb down to 9.3 - stable past 12 hours. No issues with line or bleeding reported per RN.  Goal of Therapy:  Heparin level 0.2-0.5 units/ml   Plan:  Increase heparin rate slightly to 1900 units/hr Repeat heparin level in 6 hours  Sherlon Handing, PharmD, BCPS Please see amion for complete clinical pharmacist phone list 01/15/2021, 5:22 AM

## 2021-01-15 NOTE — Progress Notes (Addendum)
NAME:  Morgan Daniels, MRN:  [id number], DOB:  03-05-1960, LOS: 2 ADMISSION DATE:  01/13/2021, CONSULTATION DATE:  11/28 REFERRING MD:  Marcelle Smiling, REASON FOR CONSULT:  ventilator assistance   History of Present Illness:  Patient is encephalopathic and/or intubated. Therefore history has been obtained from chart review.  Morgan Daniels is a 60 y.o. female who is seen in consultation at the request of Dr. Marcelle Smiling for recommendations on further evaluation and management of ventilator assistance.   They have a pertnent past medical history of PAD, HTN, HLD, current tobacco abuse, COPD, DM2  Morgan Daniels is an 60 y.o. who presented to the [facility] ED on 01/12/2021 via EMS due to acute respiratory failure and unresponsiveness.  Per ED documentation and EMS report the patient began having difficulty breathing and told her husband that she felt as if she was going to die.  Her husband placed her on his home CPAP machine while calling EMS.  Upon EMSs arrival she was agonal he breathing and significantly hypoxic, they transitioned her to a [facility] airway for transport. The patient never lost pulses. They were intubated in the ED. The patient did not meet STEMI criteria. They were worked up for PE VS PNU. Cefepime and vancomycin was given. CTA negative for PE, CTH negative. The patient was extubated 11/27. The patient later developed respiratory distress, was reintubated, CODE STEMI was called. In cath lab they were found to have a 100% occluded RCA. A IABP was placed. Transferred to [facility] to be managed by cardiology.  PCCM was consulted for ventilatory assistance   Pertinent  Medical History  PAD, HTN, HLD, current tobacco abuse, COPD, DM2, CVA, peptic ulcer,   Significant Hospital Events: Including procedures, antibiotic start and stop dates in addition to other pertinent events   01/12/2021: Patient admitted to [facility] ICU with acute hypoxic respiratory failure requiring emergent intubation and mechanical ventilatory  support secondary to pneumonia versus acute heart failure.  CTA negative for PE, CTh negative. 01/13/21: Patient extubated during the day, ECHO: LVEF 60-65%, no wall motion abnormalities, G1DD. cardiology planned for scheduled LHC. Patient then developed respiratory distress and was emergently re-intubated CODE STEMI called patient taken to cath lab, RCA 100% occluded > IABP placed emergently and patient transferred to [facility] emergently. 11/28 TXR to [facility]. Cardiology primary. PCCM consulted  Interim History / Subjective:  No acute issues overnight.   Objective   Blood pressure 138/84, pulse (!) 115, temperature (!) 100.7 F (38.2 C), temperature source Axillary, resp. rate (!) 25, weight 89.3 kg, SpO2 96 %. CVP:  [8 mmHg-22 mmHg] 21 mmHg  Vent Mode: PRVC FiO2 (%):  [4 %-40 %] 40 % Set Rate:  [20 bmp] 20 bmp Vt Set:  [450 mL] 450 mL PEEP:  [8 cmH20] 8 cmH20 Plateau Pressure:  [18 cmH20-23 cmH20] 23 cmH20   Intake/Output Summary (Last 24 hours) at 01/15/2021 0321 Last data filed at 01/15/2021 0700 Gross per 24 hour  Intake 2986.35 ml  Output 2600 ml  Net 386.35 ml    Filed Weights   01/13/21 2300 01/14/21 0500 01/15/21 0600  Weight: 86.8 kg 86.8 kg 89.3 kg    Examination:  General: middle aged female on vent in no distress HENT: [place]/AT, PERRL, no JVD Pulm: coarse throughout Card: RRR, IABP in place.  Ext: warm, dry, no edema.  Neuro: eyes open to voice. Does not follow commands (on sedation)  Resolved Hospital Problem list     Assessment & Plan:  NSTEMI in context of Three vessel  CAD with 80% distal left main, 95% stenosis ostial LAD, 95% stenosis ostial left circumflex, 100% occluded proximal RCA Cardiogenic shock with IABP in place 11/27 ECHO LVEF 60-65%, 11/27 LHC LVEF now 40% with anteroapical and inferoapical hypokinesis. Plan: - Telemetry monitoring - ASA/Stain - NTG infusion - IV heparin per pharmacy - IABP per cards. - Need to see respiratory and neurologic  improvement in order to be a CABG candidate.   Acute respiratory failure with hypoxia CAP- Multifocal pneumonia with possible aspiration. Hx COPD Tobacco use - Full vent support. ABG reviewed and settings adjusted.  - Diuresis 60 mg q 8 hours x 3 today - VAP bundle -PAD protocol RASS goal -2 Fent infusion. Prop > Precedex due to hypertriglyceridemia.  - Currently on day 4 doxy and ceftriaxone will rx x 7d - Scheduled BDs - Tobacco cessation when appropriate   Acute encephalopathy: she had a long period of hypoxemia related to respiratory arrest - if mental status not improving by tomorrow will need to consider repeat CT head. Cannot MRI with IABP in place. - EEG  DM2 w/ hyperglycemia - Ssi  - Goal 140-180  - TF coverage added  HX HTN HLD - Holding antihypertensives - Statin addressed above - transitioning away from propofol as above  HX peptic ulcer - PPI   Best Practice (right click and "Reselect all SmartList Selections" daily)   Diet/type: tubefeeds and NPO w/ meds via tube DVT prophylaxis: systemic heparin GI prophylaxis: PPI Lines: N/A Foley:  Yes, and it is still needed Code Status:  full code Last date of multidisciplinary goals of care discussion [per primary]   Critical care time: 43 minutes     Georgann Housekeeper, AGACNP-BC [place] for personal pager PCCM on call pager [phone] until 7pm. Please call Elink 7p-7a. [phone]  01/15/2021 8:35 AM

## 2021-01-15 NOTE — Progress Notes (Signed)
100 of fentanyl wasted with Lester Kinsman, RN

## 2021-01-15 NOTE — Progress Notes (Signed)
  Subjective: Intubated, opens eyes to voice, not following commands  Objective: Vital signs in last 24 hours: Temp:  [99.8 F (37.7 C)-101 F (38.3 C)] 100.7 F (38.2 C) (11/29 0700) Pulse Rate:  [92-115] 115 (11/29 0802) Cardiac Rhythm: Sinus tachycardia (11/29 0400) Resp:  [9-30] 25 (11/29 0802) BP: (112-146)/(46-84) 138/84 (11/29 0802) SpO2:  [92 %-100 %] 96 % (11/29 0811) FiO2 (%):  [4 %-40 %] 40 % (11/29 0811) Weight:  [89.3 kg] 89.3 kg (11/29 0600)  Hemodynamic parameters for last 24 hours: CVP:  [8 mmHg-22 mmHg] 21 mmHg  Intake/Output from previous day: 11/28 0701 - 11/29 0700 In: 2986.4 [I.V.:1834.8; NG/GT:281; IV Piggyback:870.5] Out: 2660 [Urine:2660] Intake/Output this shift: No intake/output data recorded.  General appearance: intubated Neurologic: opens eyes, moves spont, not following commands Heart: tachy, regular Lungs: clear anteriorly Extremities: well perfused  Lab Results: Recent Labs    01/14/21 2226 01/15/21 0412  WBC 14.1* 15.3*  HGB 9.1* 9.3*  HCT 28.1* 28.6*  PLT 255 239   BMET:  Recent Labs    01/14/21 2046 01/15/21 0412  NA 133* 131*  K 4.2 4.4  CL 99 97*  CO2 26 26  GLUCOSE 202* 214*  BUN 22* 26*  CREATININE 0.94 0.97  CALCIUM 8.2* 8.4*    PT/INR:  Recent Labs    01/12/21 2107  LABPROT 15.0  INR 1.2   ABG    Component Value Date/Time   PHART 7.328 (L) 01/14/2021 0307   HCO3 27.8 01/14/2021 0307   TCO2 29 01/14/2021 0307   ACIDBASEDEF 9.6 (H) 01/12/2021 2122   O2SAT 77.7 01/15/2021 0412   CBG (last 3)  Recent Labs    01/15/21 0412 01/15/21 0648 01/15/21 0751  GLUCAP 224* 167* 185*    Assessment/Plan:  Left main and 3 vessel CAD s/p MI CV- tachycardic, co-ox 77, IABP at 1:1 RESP- VDRF- definitely pulmonary edema, possible pneumonia as well  Still intubated  and on full vent support  For SBT this AM  Vent per CCM  Continue diuresis, CXR better at right base this AM RENAL- creatinine normal, continue  diuresis ENDO- CBG still elevated but trending down GI- on tube feedings NEURO_ still on propofol- not following commands  Continue medical management, Has made progress with diuresis. Hopefully can lighten sedation to get a better neuro assessment. Possible CABG later this week.    LOS: 2 days    [name] 01/15/2021

## 2021-01-15 NOTE — Progress Notes (Signed)
Pre-CABG testing has been completed. Preliminary results can be found in CV Proc through chart review.   01/15/21 4:11 PM Morgan Daniels RVT

## 2021-01-15 NOTE — Progress Notes (Addendum)
Progress Note  Patient Name: Morgan Daniels Date of Encounter: 01/15/2021  Cardiologist: Dr. Saralyn Pilar  Subjective   Intubated, sedated  Inpatient Medications    Scheduled Meds:  arformoterol  15 mcg Nebulization BID   aspirin EC  81 mg Oral Daily   atorvastatin  80 mg Oral Daily   chlorhexidine gluconate (MEDLINE KIT)  15 mL Mouth Rinse BID   Chlorhexidine Gluconate Cloth  6 each Topical Daily   docusate  100 mg Per Tube BID   feeding supplement (VITAL HIGH PROTEIN)  1,000 mL Per Tube Q24H   furosemide  60 mg Intravenous Q8H   insulin aspart  0-15 Units Subcutaneous Q4H   insulin aspart  3 Units Subcutaneous Q4H   insulin glargine-yfgn  25 Units Subcutaneous BID   ipratropium-albuterol  3 mL Nebulization Q6H   mouth rinse  15 mL Mouth Rinse 10 times per day   metoprolol tartrate  12.5 mg Oral BID   pantoprazole (PROTONIX) IV  40 mg Intravenous Daily   polyethylene glycol  17 g Per Tube Daily   revefenacin  175 mcg Nebulization Daily   sodium chloride flush  10-40 mL Intracatheter Q12H   Continuous Infusions:  cefTRIAXone (ROCEPHIN)  IV 200 mL/hr at 01/15/21 0700   dexmedetomidine (PRECEDEX) IV infusion 1.2 mcg/kg/hr (01/15/21 6195)   doxycycline (VIBRAMYCIN) IV Stopped (01/15/21 0932)   fentaNYL infusion INTRAVENOUS 200 mcg/hr (01/15/21 0834)   heparin 1,900 Units/hr (01/15/21 0700)   nitroGLYCERIN Stopped (01/15/21 0430)   PRN Meds: acetaminophen (TYLENOL) oral liquid 160 mg/5 mL, albuterol, docusate sodium, fentaNYL (SUBLIMAZE) injection, fentaNYL (SUBLIMAZE) injection, labetalol, polyethylene glycol, sodium chloride flush   Vital Signs    Vitals:   01/15/21 0700 01/15/21 0802 01/15/21 0807 01/15/21 0811  BP:  138/84    Pulse:  (!) 115    Resp:  (!) 25    Temp: (!) 100.7 F (38.2 C)     TempSrc: Axillary     SpO2:  92% 93% 96%  Weight:        Intake/Output Summary (Last 24 hours) at 01/15/2021 0836 Last data filed at 01/15/2021 0700 Gross per 24 hour   Intake 2986.35 ml  Output 2600 ml  Net 386.35 ml   Last 3 Weights 01/15/2021 01/14/2021 01/13/2021  Weight (lbs) 196 lb 13.9 oz 191 lb 5.8 oz 191 lb 5.8 oz  Weight (kg) 89.3 kg 86.8 kg 86.8 kg      Telemetry    Sinus tach - Personally Reviewed  ECG      Physical Exam   GEN: No acute distress.  Intubated, sedated Neck: No JVD Cardiac: RRR, no murmurs, rubs, or gallops.  Respiratory: Coarse breath sounds to auscultation bilaterally. GI: Soft, nontender, non-distended  MS: No edema; No deformity.  Loud DP Doppler signal.  No right groin hematoma Neuro:  Nonfocal  Psych: Normal affect   Labs    High Sensitivity Troponin:   Recent Labs  Lab 01/13/21 0143 01/13/21 0402 01/13/21 1256 01/13/21 1605 01/13/21 1827  TROPONINIHS 2,500* 2,772* 2,913* 2,587* 1,790*     Chemistry Recent Labs  Lab 01/12/21 2107 01/13/21 0402 01/14/21 0153 01/14/21 0307 01/14/21 1550 01/14/21 2046 01/15/21 0412 01/15/21 0828  NA 135   < > 134*   < >  --  133* 131* 136  K 3.6   < > 4.2   < >  --  4.2 4.4 4.4  CL 99   < > 101  --   --  99 97*  --  CO2 18*   < > 26  --   --  26 26  --   GLUCOSE 446*   < > 216*  --   --  202* 214*  --   BUN 20   < > 24*  --   --  22* 26*  --   CREATININE 0.96   < > 0.91  --   --  0.94 0.97  --   CALCIUM 9.0   < > 8.3*  --   --  8.2* 8.4*  --   MG 2.3   < > 2.0  --  1.9 2.4 2.3  --   PROT 7.9  --   --   --   --   --   --   --   ALBUMIN 3.4*  --   --   --   --   --   --   --   AST 39  --   --   --   --   --   --   --   ALT 24  --   --   --   --   --   --   --   ALKPHOS 78  --   --   --   --   --   --   --   BILITOT 0.7  --   --   --   --   --   --   --   GFRNONAA >60   < > >60  --   --  >60 >60  --   ANIONGAP 18*   < > 7  --   --  8 8  --    < > = values in this interval not displayed.    Lipids  Recent Labs  Lab 01/15/21 0412  CHOL 161  TRIG 811*  HDL 27*  LDLCALC UNABLE TO CALCULATE IF TRIGLYCERIDE OVER 400 mg/dL  CHOLHDL 6.0     Hematology Recent Labs  Lab 01/14/21 0153 01/14/21 0307 01/14/21 2226 01/15/21 0412 01/15/21 0828  WBC 17.6*  --  14.1* 15.3*  --   RBC 3.74*  --  3.28* 3.34*  --   HGB 10.3*   < > 9.1* 9.3* 8.8*  HCT 31.8*   < > 28.1* 28.6* 26.0*  MCV 85.0  --  85.7 85.6  --   MCH 27.5  --  27.7 27.8  --   MCHC 32.4  --  32.4 32.5  --   RDW 13.2  --  13.2 13.2  --   PLT 296  --  255 239  --    < > = values in this interval not displayed.   Thyroid No results for input(s): TSH, FREET4 in the last 168 hours.  BNP Recent Labs  Lab 01/13/21 0402 01/15/21 0412  BNP 527.5* 77.3    DDimer No results for input(s): DDIMER in the last 168 hours.   Radiology    CARDIAC CATHETERIZATION  Result Date: 01/13/2021   Prox RCA lesion is 100% stenosed.   Dist LM to Ost LAD lesion is 95% stenosed.   Ost Cx lesion is 95% stenosed.   Mid Cx lesion is 70% stenosed.   Mid LAD lesion is 75% stenosed.   Mid LM to Dist LM lesion is 80% stenosed.   The left ventricular ejection fraction is 35-45% by visual estimate. 1.  Non-ST elevation myocardial infarction 2.  Three-vessel coronary artery disease with 80% distal left  main, 95% stenosis ostial LAD, 95% stenosis ostial left circumflex, occluded proximal RCA 3.  Mildly reduced left ventricular function with estimated LV ejection fraction 40% with anteroapical and inferoapical hypokinesis 4.  Intra-aortic balloon pump placed Recommendations 1.  Transfer to [facility] for urgent coronary artery bypass graft surgery   DG Chest Port 1 View  Result Date: 01/15/2021 CLINICAL DATA:  Endotracheal tube present EXAM: PORTABLE CHEST 1 VIEW COMPARISON:  01/14/2021 FINDINGS: Endotracheal tube, enteric tube, and right PICC line are present. Shallow inspiration with low lung volumes. Patchy opacities bilaterally. No pleural effusion or pneumothorax. Similar cardiomediastinal contours. IMPRESSION: Persistent bilateral opacities. Aeration at the right lung base is improved.  Electronically Signed   By: Macy Mis M.D.   On: 01/15/2021 08:24   DG CHEST PORT 1 VIEW  Result Date: 01/14/2021 CLINICAL DATA:  Initial evaluation for acute respiratory failure with hypoxia. EXAM: PORTABLE CHEST 1 VIEW COMPARISON:  Radiograph from 01/12/2021 FINDINGS: Endotracheal tube in place with tip at the superior margin of the clavicles. Diffuse related pad overlies the left chest. Stable cardiomegaly. Mediastinal silhouette within normal limits. Lungs mildly hypoinflated. Persistent bilateral interstitial and airspace disease, most pronounced at the right lung base. Overall appearance is improved, suspected to reflect improved edema. Probable small bilateral pleural effusions. No pneumothorax. Osseous structures are unchanged. IMPRESSION: 1. Tip of the endotracheal tube at the superior margin of the clavicles. 2. Persistent bilateral airspace disease, most pronounced at the right lung base. Overall appearance is mildly improved from prior, favored to reflect improved edema. Superimposed infection/infiltrates not excluded. 3. Probable small bilateral pleural effusions. Electronically Signed   By: Jeannine Boga M.D.   On: 01/14/2021 02:37   DG Abd Portable 1V  Result Date: 01/14/2021 CLINICAL DATA:  Encounter for orogastric tube EXAM: PORTABLE ABDOMEN - 1 VIEW COMPARISON:  Chest CT 2 days ago FINDINGS: Enteric tube with tip and side port at the stomach. Artifact from EKG leads. There may be an aortic balloon pump with marker 4.4 cm below the aortic knob, likely accentuated by abdominal projection. Diffuse patchy airspace density. Bowel gas pattern is nonobstructive. Renal contrast from prior CT. IMPRESSION: 1. Enteric tube with tip and side port at the stomach. 2. Aortic balloon pump with marker approximately 4 cm below the aortic knob. Electronically Signed   By: Jorje Guild M.D.   On: 01/14/2021 04:46   ECHOCARDIOGRAM COMPLETE  Result Date: 01/13/2021    ECHOCARDIOGRAM REPORT    Patient Name:   Morgan Daniels Date of Exam: 01/13/2021 Medical Rec #:  [id number]    Height:       66.0 in Accession #:    [id number]   Weight:       202.4 lb Date of Birth:  06/27/1960    BSA:          2.010 m Patient Age:    60 years     BP:           112/65 mmHg Patient Gender: F            HR:           114 bpm. Exam Location:  [facility] Procedure: 2D Echo, Color Doppler and Cardiac Doppler Indications:     I42.9 Cardiomyopathy-unspecified  History:         Patient has no prior history of Echocardiogram examinations.                  CKD and Stroke; Risk Factors:Current Smoker, Hypertension  and                  Diabetes.  Sonographer:     Charmayne Sheer Referring Phys:  [id number] BRITTON L RUST-CHESTER Diagnosing Phys: Ida Rogue MD  Sonographer Comments: Echo performed with patient supine and on artificial respirator. IMPRESSIONS  1. Left ventricular ejection fraction, by estimation, is 60 to 65%. The left ventricle has normal function. The left ventricle has no regional wall motion abnormalities. Left ventricular diastolic parameters are consistent with Grade I diastolic dysfunction (impaired relaxation).  2. Right ventricular systolic function is normal. The right ventricular size is normal. Tricuspid regurgitation signal is inadequate for assessing PA pressure.  3. The mitral valve is normal in structure. Mild mitral valve regurgitation. No evidence of mitral stenosis.  4. The aortic valve is normal in structure. Aortic valve regurgitation is not visualized. Aortic valve sclerosis/calcification is present, without any evidence of aortic stenosis.  5. The inferior vena cava is dilated in size with >50% respiratory variability, suggesting right atrial pressure of 8 mmHg. FINDINGS  Left Ventricle: Left ventricular ejection fraction, by estimation, is 60 to 65%. The left ventricle has normal function. The left ventricle has no regional wall motion abnormalities. The left ventricular internal cavity size was normal in  size. There is  borderline left ventricular hypertrophy. Left ventricular diastolic parameters are consistent with Grade I diastolic dysfunction (impaired relaxation). Right Ventricle: The right ventricular size is normal. No increase in right ventricular wall thickness. Right ventricular systolic function is normal. Tricuspid regurgitation signal is inadequate for assessing PA pressure. Left Atrium: Left atrial size was normal in size. Right Atrium: Right atrial size was normal in size. Pericardium: There is no evidence of pericardial effusion. Mitral Valve: The mitral valve is normal in structure. Mild mitral valve regurgitation. No evidence of mitral valve stenosis. MV peak gradient, 9.1 mmHg. The mean mitral valve gradient is 5.0 mmHg. Tricuspid Valve: The tricuspid valve is normal in structure. Tricuspid valve regurgitation is mild . No evidence of tricuspid stenosis. Aortic Valve: The aortic valve is normal in structure. Aortic valve regurgitation is not visualized. Aortic valve sclerosis/calcification is present, without any evidence of aortic stenosis. Aortic valve mean gradient measures 9.0 mmHg. Aortic valve peak  gradient measures 14.9 mmHg. Aortic valve area, by VTI measures 2.60 cm. Pulmonic Valve: The pulmonic valve was normal in structure. Pulmonic valve regurgitation is not visualized. No evidence of pulmonic stenosis. Aorta: The aortic root is normal in size and structure. Venous: The inferior vena cava is dilated in size with greater than 50% respiratory variability, suggesting right atrial pressure of 8 mmHg. IAS/Shunts: No atrial level shunt detected by color flow Doppler.  LEFT VENTRICLE PLAX 2D LVIDd:         5.40 cm   Diastology LVIDs:         3.80 cm   LV e' medial:    6.20 cm/s LV PW:         1.10 cm   LV E/e' medial:  17.4 LV IVS:        0.90 cm   LV e' lateral:   5.22 cm/s LVOT diam:     2.20 cm   LV E/e' lateral: 20.7 LV SV:         85 LV SV Index:   42 LVOT Area:     3.80 cm  RIGHT  VENTRICLE RV Basal diam:  2.70 cm RV S prime:     22.60 cm/s LEFT ATRIUM  Index        RIGHT ATRIUM           Index LA diam:      4.20 cm 2.09 cm/m   RA Area:     13.80 cm LA Vol (A2C): 37.6 ml 18.71 ml/m  RA Volume:   33.90 ml  16.87 ml/m LA Vol (A4C): 37.3 ml 18.56 ml/m  AORTIC VALVE                     PULMONIC VALVE AV Area (Vmax):    2.42 cm      PV Vmax:       1.74 m/s AV Area (Vmean):   2.39 cm      PV Vmean:      111.000 cm/s AV Area (VTI):     2.60 cm      PV VTI:        0.281 m AV Vmax:           193.00 cm/s   PV Peak grad:  12.1 mmHg AV Vmean:          146.000 cm/s  PV Mean grad:  6.0 mmHg AV VTI:            0.327 m AV Peak Grad:      14.9 mmHg AV Mean Grad:      9.0 mmHg LVOT Vmax:         123.00 cm/s LVOT Vmean:        91.900 cm/s LVOT VTI:          0.224 m LVOT/AV VTI ratio: 0.69  AORTA Ao Root diam: 3.20 cm MITRAL VALVE MV Area (PHT): 7.29 cm     SHUNTS MV Area VTI:   3.45 cm     Systemic VTI:  0.22 m MV Peak grad:  9.1 mmHg     Systemic Diam: 2.20 cm MV Mean grad:  5.0 mmHg MV Vmax:       1.51 m/s MV Vmean:      105.0 cm/s MV Decel Time: 104 msec MV E velocity: 108.00 cm/s MV A velocity: 121.00 cm/s MV E/A ratio:  0.89 Ida Rogue MD Electronically signed by Ida Rogue MD Signature Date/Time: 01/13/2021/1:42:11 PM    Final    [place] EKG SITE RITE  Result Date: 01/14/2021 If Site Rite image not attached, placement could not be confirmed due to current cardiac rhythm.   Cardiac Studies   Cath and echo personally reviewed  Patient Profile     60 y.o. female with multivessel CAD and cardiogenic shock  Assessment & Plan    CAD/cardiogenic shock: Intra-aortic balloon pump in place.  Blood pressure has been stable.  She has been on a nitroglycerin drip which we have been trying to wean.  Some improvement in chest x-ray after Lasix.  Renal function is stable so I think we can give her additional IV Lasix.  IABP in place.  NTG weaned off.  Keep systolic BP in the  081-388 mm Hg range.   TG elevated.  Will improve with with better glucose control.   Lowering sedation to better assess mental status. Ppreciate CT surgery following.   For questions or updates, please contact [place] Please consult [email or web address] for contact info under        Signed, Larae Grooms, MD  01/15/2021, 8:36 AM

## 2021-01-15 NOTE — Progress Notes (Signed)
ANTICOAGULATION CONSULT NOTE - Follow Up Consult  Pharmacy Consult for heparin Indication:  IABP  Labs: Recent Labs    01/12/21 2107 01/12/21 2344 01/13/21 1256 01/13/21 1605 01/13/21 1827 01/14/21 0153 01/14/21 0307 01/14/21 1930 01/14/21 2046 01/14/21 2226 01/15/21 0412 01/15/21 0828 01/15/21 1119  HGB 12.6   < >  --   --   --  10.3*   < >  --   --  9.1* 9.3* 8.8*  --   HCT 41.1   < >  --   --   --  31.8*   < >  --   --  28.1* 28.6* 26.0*  --   PLT 538*   < >  --   --   --  296  --   --   --  255 239  --   --   APTT 32  --   --   --   --   --   --   --   --   --   --   --   --   LABPROT 15.0  --   --   --   --   --   --   --   --   --   --   --   --   INR 1.2  --   --   --   --   --   --   --   --   --   --   --   --   HEPARINUNFRC  --    < >  --   --  <0.10* <0.10*   < > <0.10*  --   --  0.13*  --  0.16*  CREATININE 0.96   < >  --   --   --  0.91  --   --  0.94  --  0.97  --   --   TROPONINIHS 321*   < > 2,913* 2,587* 1,790*  --   --   --   --   --   --   --   --    < > = values in this interval not displayed.     Assessment: 60yo female subtherapeutic on heparin after arrival from [facility] cath lab w/ IABP placement, awaiting possible CABG pending clinical improvement.   Heparin level remains subtherapeutic at 0.16 on 1900 units/hr. Hgb trending down continues to trend down. No concern for active bleed at this time. Unable to hold heparin given IABP placement. Will slightly increase heparin rate.   Goal of Therapy:  Heparin level 0.2-0.5 units/ml   Plan:  Increase heparin rate to 2000 units/hr Repeat heparin level in 6 hours Monitor CBC, heparin level, and s/sx of bleeding daily.   Joseph Art, Pharm.D. PGY-1 Pharmacy Resident [phone] 01/15/2021 1:26 PM

## 2021-01-15 NOTE — Progress Notes (Signed)
EEG complete - results pending 

## 2021-01-15 NOTE — Progress Notes (Signed)
ANTICOAGULATION CONSULT NOTE - Follow Up Consult  Pharmacy Consult for heparin Indication:  IABP  Labs: Recent Labs    01/13/21 1256 01/13/21 1605 01/13/21 1827 01/14/21 0153 01/14/21 0307 01/14/21 2046 01/14/21 2226 01/15/21 0412 01/15/21 0828 01/15/21 1119 01/15/21 1941  HGB  --   --   --  10.3*   < >  --  9.1* 9.3* 8.8*  --   --   HCT  --   --   --  31.8*   < >  --  28.1* 28.6* 26.0*  --   --   PLT  --   --   --  296  --   --  255 239  --   --   --   HEPARINUNFRC  --   --  <0.10* <0.10*   < >  --   --  0.13*  --  0.16* 0.18*  CREATININE  --   --   --  0.91  --  0.94  --  0.97  --   --  1.07*  TROPONINIHS 2,913* 2,587* 1,790*  --   --   --   --   --   --   --   --    < > = values in this interval not displayed.     Assessment: 60yo female subtherapeutic on heparin after arrival from [facility] cath lab w/ IABP placement, awaiting possible CABG pending clinical improvement.   Heparin level remains subtherapeutic at 0.18 on 2000 units/hr. Hgb trending down this am. No concern for active bleed at this time. Unable to hold heparin given IABP placement. Will slightly increase heparin rate.   Goal of Therapy:  Heparin level 0.2-0.5 units/ml   Plan:  Increase heparin rate to 2100 units/hr Monitor CBC, heparin level, and s/sx of bleeding daily.   Erin Hearing PharmD., BCPS Clinical Pharmacist 01/15/2021 9:35 PM

## 2021-01-15 NOTE — Procedures (Signed)
Patient Name: Morgan Daniels  MRN: [id number]  Epilepsy Attending: Lora Havens  Referring Physician/Provider: Georgann Housekeeper, NP Date: 01/15/2021 Duration: 23.31 mins  Patient history: 60 year old female with altered mental status and seizure-like episodes. EEG to evaluate for seizure.  Level of alertness: lethargic   AEDs during EEG study: None  Technical aspects: This EEG study was done with scalp electrodes positioned according to the 10-20 International system of electrode placement. Electrical activity was acquired at a sampling rate of 500Hz  and reviewed with a high frequency filter of 70Hz  and a low frequency filter of 1Hz . EEG data were recorded continuously and digitally stored.   Description: EEG showed continuous generalized 3 to 6 Hz theta-delta slowing.  At around 1044, patient was noted to have right hand tremor lasting for few seconds. Concomitant EEG before, during and after the event did not show any EEG changes suggest seizure.  Hyperventilation and photic stimulation were not performed.     ABNORMALITY - Continuous slow, generalized  IMPRESSION: This study is suggestive of moderate diffuse encephalopathy, nonspecific etiology.at around 1044, patient was noted to have right hand tremors lasting for few seconds without concomitant EEG change. This was most likely not an epileptic event.  No definite seizures or epileptiform discharges were seen throughout the recording.  Morgan Daniels Barbra Sarks

## 2021-01-16 ENCOUNTER — Inpatient Hospital Stay (HOSPITAL_COMMUNITY): Payer: Medicare Other

## 2021-01-16 DIAGNOSIS — J189 Pneumonia, unspecified organism: Secondary | ICD-10-CM | POA: Diagnosis not present

## 2021-01-16 DIAGNOSIS — I25110 Atherosclerotic heart disease of native coronary artery with unstable angina pectoris: Secondary | ICD-10-CM | POA: Diagnosis not present

## 2021-01-16 DIAGNOSIS — J810 Acute pulmonary edema: Secondary | ICD-10-CM | POA: Diagnosis not present

## 2021-01-16 DIAGNOSIS — I214 Non-ST elevation (NSTEMI) myocardial infarction: Secondary | ICD-10-CM | POA: Diagnosis not present

## 2021-01-16 DIAGNOSIS — R570 Cardiogenic shock: Secondary | ICD-10-CM | POA: Diagnosis not present

## 2021-01-16 DIAGNOSIS — I2510 Atherosclerotic heart disease of native coronary artery without angina pectoris: Secondary | ICD-10-CM

## 2021-01-16 LAB — CBC
HCT: 25.9 % — ABNORMAL LOW (ref 36.0–46.0)
Hemoglobin: 8.4 g/dL — ABNORMAL LOW (ref 12.0–15.0)
MCH: 27.1 pg (ref 26.0–34.0)
MCHC: 32.4 g/dL (ref 30.0–36.0)
MCV: 83.5 fL (ref 80.0–100.0)
Platelets: 157 10*3/uL (ref 150–400)
RBC: 3.1 MIL/uL — ABNORMAL LOW (ref 3.87–5.11)
RDW: 13 % (ref 11.5–15.5)
WBC: 14 10*3/uL — ABNORMAL HIGH (ref 4.0–10.5)
nRBC: 0 % (ref 0.0–0.2)

## 2021-01-16 LAB — POCT I-STAT 7, (LYTES, BLD GAS, ICA,H+H)
Acid-Base Excess: 2 mmol/L (ref 0.0–2.0)
Acid-Base Excess: 7 mmol/L — ABNORMAL HIGH (ref 0.0–2.0)
Acid-Base Excess: 9 mmol/L — ABNORMAL HIGH (ref 0.0–2.0)
Bicarbonate: 28.3 mmol/L — ABNORMAL HIGH (ref 20.0–28.0)
Bicarbonate: 33.1 mmol/L — ABNORMAL HIGH (ref 20.0–28.0)
Bicarbonate: 33.7 mmol/L — ABNORMAL HIGH (ref 20.0–28.0)
Calcium, Ion: 1.24 mmol/L (ref 1.15–1.40)
Calcium, Ion: 1.24 mmol/L (ref 1.15–1.40)
Calcium, Ion: 1.17 mmol/L (ref 1.15–1.40)
HCT: 24 % — ABNORMAL LOW (ref 36.0–46.0)
HCT: 27 % — ABNORMAL LOW (ref 36.0–46.0)
HCT: 25 % — ABNORMAL LOW (ref 36.0–46.0)
Hemoglobin: 8.2 g/dL — ABNORMAL LOW (ref 12.0–15.0)
Hemoglobin: 9.2 g/dL — ABNORMAL LOW (ref 12.0–15.0)
Hemoglobin: 8.5 g/dL — ABNORMAL LOW (ref 12.0–15.0)
O2 Saturation: 97 %
O2 Saturation: 100 %
O2 Saturation: 100 %
Patient temperature: 99
Patient temperature: 98.6
Patient temperature: 97.5
Potassium: 4 mmol/L (ref 3.5–5.1)
Potassium: 3.8 mmol/L (ref 3.5–5.1)
Potassium: 3.6 mmol/L (ref 3.5–5.1)
Sodium: 137 mmol/L (ref 135–145)
Sodium: 138 mmol/L (ref 135–145)
Sodium: 137 mmol/L (ref 135–145)
TCO2: 30 mmol/L (ref 22–32)
TCO2: 35 mmol/L — ABNORMAL HIGH (ref 22–32)
TCO2: 35 mmol/L — ABNORMAL HIGH (ref 22–32)
pCO2 arterial: 52.4 mmHg — ABNORMAL HIGH (ref 32.0–48.0)
pCO2 arterial: 52.9 mmHg — ABNORMAL HIGH (ref 32.0–48.0)
pCO2 arterial: 42.8 mmHg (ref 32.0–48.0)
pH, Arterial: 7.342 — ABNORMAL LOW (ref 7.350–7.450)
pH, Arterial: 7.404 (ref 7.350–7.450)
pH, Arterial: 7.502 — ABNORMAL HIGH (ref 7.350–7.450)
pO2, Arterial: 94 mmHg (ref 83.0–108.0)
pO2, Arterial: 193 mmHg — ABNORMAL HIGH (ref 83.0–108.0)
pO2, Arterial: 209 mmHg — ABNORMAL HIGH (ref 83.0–108.0)

## 2021-01-16 LAB — PROTIME-INR
INR: 1.2 (ref 0.8–1.2)
Prothrombin Time: 14.7 seconds (ref 11.4–15.2)

## 2021-01-16 LAB — HEPARIN LEVEL (UNFRACTIONATED)
Heparin Unfractionated: 0.23 IU/mL — ABNORMAL LOW (ref 0.30–0.70)
Heparin Unfractionated: 0.12 IU/mL — ABNORMAL LOW (ref 0.30–0.70)
Heparin Unfractionated: 0.13 IU/mL — ABNORMAL LOW (ref 0.30–0.70)

## 2021-01-16 LAB — BASIC METABOLIC PANEL
Anion gap: 7 (ref 5–15)
Anion gap: 10 (ref 5–15)
BUN: 26 mg/dL — ABNORMAL HIGH (ref 6–20)
BUN: 26 mg/dL — ABNORMAL HIGH (ref 6–20)
CO2: 27 mmol/L (ref 22–32)
CO2: 29 mmol/L (ref 22–32)
Calcium: 8.7 mg/dL — ABNORMAL LOW (ref 8.9–10.3)
Calcium: 8.7 mg/dL — ABNORMAL LOW (ref 8.9–10.3)
Chloride: 100 mmol/L (ref 98–111)
Chloride: 97 mmol/L — ABNORMAL LOW (ref 98–111)
Creatinine, Ser: 0.83 mg/dL (ref 0.44–1.00)
Creatinine, Ser: 0.74 mg/dL (ref 0.44–1.00)
GFR, Estimated: >60 mL/min (ref >60)
GFR, Estimated: >60 mL/min (ref >60)
Glucose, Bld: 191 mg/dL — ABNORMAL HIGH (ref 70–99)
Glucose, Bld: 187 mg/dL — ABNORMAL HIGH (ref 70–99)
Potassium: 3.9 mmol/L (ref 3.5–5.1)
Potassium: 3.4 mmol/L — ABNORMAL LOW (ref 3.5–5.1)
Sodium: 134 mmol/L — ABNORMAL LOW (ref 135–145)
Sodium: 136 mmol/L (ref 135–145)

## 2021-01-16 LAB — GLUCOSE, CAPILLARY
Glucose-Capillary: 142 mg/dL — ABNORMAL HIGH (ref 70–99)
Glucose-Capillary: 157 mg/dL — ABNORMAL HIGH (ref 70–99)
Glucose-Capillary: 166 mg/dL — ABNORMAL HIGH (ref 70–99)
Glucose-Capillary: 169 mg/dL — ABNORMAL HIGH (ref 70–99)
Glucose-Capillary: 189 mg/dL — ABNORMAL HIGH (ref 70–99)
Glucose-Capillary: 230 mg/dL — ABNORMAL HIGH (ref 70–99)

## 2021-01-16 LAB — LIPID PANEL
Cholesterol: 138 mg/dL (ref 0–200)
HDL: 30 mg/dL — ABNORMAL LOW (ref >40)
LDL Cholesterol: 71 mg/dL (ref 0–99)
Total CHOL/HDL Ratio: 4.6 RATIO
Triglycerides: 184 mg/dL — ABNORMAL HIGH (ref <150)
VLDL: 37 mg/dL (ref 0–40)

## 2021-01-16 LAB — COOXEMETRY PANEL
Carboxyhemoglobin: 1.4 % (ref 0.5–1.5)
Methemoglobin: 0.9 % (ref 0.0–1.5)
O2 Saturation: 74.8 %
Total hemoglobin: 13.2 g/dL (ref 12.0–16.0)

## 2021-01-16 LAB — PHOSPHORUS: Phosphorus: 3.9 mg/dL (ref 2.5–4.6)

## 2021-01-16 LAB — LACTATE DEHYDROGENASE: LDH: 418 U/L — ABNORMAL HIGH (ref 98–192)

## 2021-01-16 LAB — ABO/RH: ABO/RH(D): A NEG

## 2021-01-16 LAB — PROCALCITONIN: Procalcitonin: 2.81 ng/mL

## 2021-01-16 LAB — MAGNESIUM: Magnesium: 1.8 mg/dL (ref 1.7–2.4)

## 2021-01-16 MED ORDER — ROCURONIUM BROMIDE 10 MG/ML (PF) SYRINGE
PREFILLED_SYRINGE | INTRAVENOUS | Status: AC
  Administered 2021-01-16: 80 mg via INTRAVENOUS
  Filled 2021-01-16: qty 10

## 2021-01-16 MED ORDER — FENTANYL CITRATE PF 50 MCG/ML IJ SOSY: 200.0000 ug | PREFILLED_SYRINGE | Freq: Once | INTRAMUSCULAR | Status: DC

## 2021-01-16 MED ORDER — FENTANYL CITRATE PF 50 MCG/ML IJ SOSY: 50.0000 ug | PREFILLED_SYRINGE | Freq: Once | INTRAMUSCULAR | Status: AC

## 2021-01-16 MED ORDER — ORAL CARE MOUTH RINSE: 15.0000 mL | Freq: Two times a day (BID) | OROMUCOSAL | Status: DC

## 2021-01-16 MED ORDER — MIDAZOLAM HCL 2 MG/2ML IJ SOLN
INTRAMUSCULAR | Status: AC
  Filled 2021-01-16: qty 2

## 2021-01-16 MED ORDER — INSULIN REGULAR(HUMAN) IN NACL 100-0.9 UT/100ML-% IV SOLN
INTRAVENOUS | Status: DC
  Filled 2021-01-16: qty 100

## 2021-01-16 MED ORDER — KETAMINE HCL 50 MG/5ML IJ SOSY
PREFILLED_SYRINGE | INTRAMUSCULAR | Status: AC
  Filled 2021-01-16: qty 5

## 2021-01-16 MED ORDER — DIAZEPAM 5 MG PO TABS: 5.0000 mg | ORAL_TABLET | Freq: Once | ORAL | Status: DC

## 2021-01-16 MED ORDER — POTASSIUM CHLORIDE 10 MEQ/50ML IV SOLN
10.0000 meq | INTRAVENOUS | Status: AC
  Administered 2021-01-16 – 2021-01-17 (×4): 10 meq via INTRAVENOUS
  Filled 2021-01-16 (×4): qty 50

## 2021-01-16 MED ORDER — CHLORHEXIDINE GLUCONATE 0.12% ORAL RINSE (MEDLINE KIT)
15.0000 mL | Freq: Two times a day (BID) | OROMUCOSAL | Status: DC
  Administered 2021-01-16 – 2021-01-17 (×3): 15 mL via OROMUCOSAL

## 2021-01-16 MED ORDER — ROCURONIUM BROMIDE 50 MG/5ML IV SOLN
80.0000 mg | Freq: Once | INTRAVENOUS | Status: AC
  Filled 2021-01-16: qty 8

## 2021-01-16 MED ORDER — WHITE PETROLATUM EX OINT
TOPICAL_OINTMENT | CUTANEOUS | Status: DC | PRN
  Administered 2021-01-16: 0.2 via TOPICAL
  Filled 2021-01-16: qty 28.35

## 2021-01-16 MED ORDER — NOREPINEPHRINE 4 MG/250ML-% IV SOLN
0.0000 ug/min | INTRAVENOUS | Status: DC
  Filled 2021-01-16: qty 250

## 2021-01-16 MED ORDER — MIDAZOLAM HCL 2 MG/2ML IJ SOLN
INTRAMUSCULAR | Status: AC
  Administered 2021-01-16: 4 mg via INTRAVENOUS
  Filled 2021-01-16: qty 2

## 2021-01-16 MED ORDER — SODIUM CHLORIDE 0.9 % IV SOLN
2.0000 g | INTRAVENOUS | Status: DC
  Administered 2021-01-17: 2 g via INTRAVENOUS
  Filled 2021-01-16: qty 20

## 2021-01-16 MED ORDER — MIDAZOLAM HCL 2 MG/2ML IJ SOLN: 4.0000 mg | Freq: Once | INTRAMUSCULAR | Status: AC

## 2021-01-16 MED ORDER — VANCOMYCIN HCL 1500 MG/300ML IV SOLN
1500.0000 mg | INTRAVENOUS | Status: DC
  Filled 2021-01-16: qty 300

## 2021-01-16 MED ORDER — POTASSIUM CHLORIDE 2 MEQ/ML IV SOLN
80.0000 meq | INTRAVENOUS | Status: DC
  Filled 2021-01-16: qty 40

## 2021-01-16 MED ORDER — CEFAZOLIN SODIUM-DEXTROSE 2-4 GM/100ML-% IV SOLN
2.0000 g | INTRAVENOUS | Status: DC
  Filled 2021-01-16: qty 100

## 2021-01-16 MED ORDER — ONDANSETRON HCL 4 MG/2ML IJ SOLN: 4.0000 mg | Freq: Four times a day (QID) | INTRAMUSCULAR | Status: DC | PRN

## 2021-01-16 MED ORDER — SUCCINYLCHOLINE CHLORIDE 200 MG/10ML IV SOSY
PREFILLED_SYRINGE | INTRAVENOUS | Status: AC
  Filled 2021-01-16: qty 10

## 2021-01-16 MED ORDER — MILRINONE LACTATE IN DEXTROSE 20-5 MG/100ML-% IV SOLN
0.3000 ug/kg/min | INTRAVENOUS | Status: DC
  Filled 2021-01-16: qty 100

## 2021-01-16 MED ORDER — MAGNESIUM SULFATE 50 % IJ SOLN
40.0000 meq | INTRAMUSCULAR | Status: DC
  Filled 2021-01-16: qty 9.85

## 2021-01-16 MED ORDER — NITROGLYCERIN IN D5W 200-5 MCG/ML-% IV SOLN
2.0000 ug/min | INTRAVENOUS | Status: DC
  Filled 2021-01-16: qty 250

## 2021-01-16 MED ORDER — PHENYLEPHRINE HCL-NACL 20-0.9 MG/250ML-% IV SOLN
30.0000 ug/min | INTRAVENOUS | Status: DC
  Filled 2021-01-16: qty 250

## 2021-01-16 MED ORDER — TRANEXAMIC ACID (OHS) BOLUS VIA INFUSION
15.0000 mg/kg | INTRAVENOUS | Status: DC
  Filled 2021-01-16: qty 1352

## 2021-01-16 MED ORDER — METOPROLOL TARTRATE 12.5 MG HALF TABLET
12.5000 mg | ORAL_TABLET | Freq: Once | ORAL | Status: AC
  Administered 2021-01-17: 12.5 mg
  Filled 2021-01-16: qty 1

## 2021-01-16 MED ORDER — RACEPINEPHRINE HCL 2.25 % IN NEBU: 0.5000 mL | INHALATION_SOLUTION | Freq: Once | RESPIRATORY_TRACT | Status: DC

## 2021-01-16 MED ORDER — FENTANYL 2500MCG IN NS 250ML (10MCG/ML) PREMIX INFUSION
0.0000 ug/h | INTRAVENOUS | Status: DC
  Administered 2021-01-16: 50 ug/h via INTRAVENOUS
  Administered 2021-01-17: 300 ug/h via INTRAVENOUS
  Administered 2021-01-17: 200 ug/h via INTRAVENOUS
  Administered 2021-01-18: 275 ug/h via INTRAVENOUS
  Administered 2021-01-18: 250 ug/h via INTRAVENOUS
  Filled 2021-01-16 (×5): qty 250

## 2021-01-16 MED ORDER — LIDOCAINE HCL URETHRAL/MUCOSAL 2 % EX GEL
1.0000 "application " | Freq: Once | CUTANEOUS | Status: DC
  Filled 2021-01-16: qty 6

## 2021-01-16 MED ORDER — SODIUM CHLORIDE 0.9 % IV SOLN: 2.0000 g | Freq: Four times a day (QID) | INTRAVENOUS | Status: DC

## 2021-01-16 MED ORDER — ETOMIDATE 2 MG/ML IV SOLN
INTRAVENOUS | Status: AC
  Filled 2021-01-16: qty 20

## 2021-01-16 MED ORDER — PLASMA-LYTE A IV SOLN
INTRAVENOUS | Status: DC
  Filled 2021-01-16: qty 5

## 2021-01-16 MED ORDER — DEXMEDETOMIDINE HCL IN NACL 400 MCG/100ML IV SOLN
0.1000 ug/kg/h | INTRAVENOUS | Status: DC
  Filled 2021-01-16: qty 100

## 2021-01-16 MED ORDER — FENTANYL BOLUS VIA INFUSION: 50.0000 ug | INTRAVENOUS | Status: DC | PRN

## 2021-01-16 MED ORDER — PHENOL 1.4 % MT LIQD
1.0000 | OROMUCOSAL | Status: DC | PRN
  Administered 2021-01-16: 1 via OROMUCOSAL
  Filled 2021-01-16: qty 177

## 2021-01-16 MED ORDER — MIDAZOLAM HCL 2 MG/2ML IJ SOLN
2.0000 mg | INTRAMUSCULAR | Status: DC | PRN
  Administered 2021-01-16: 2 mg via INTRAVENOUS
  Filled 2021-01-16: qty 2

## 2021-01-16 MED ORDER — ONDANSETRON HCL 4 MG/2ML IJ SOLN
INTRAMUSCULAR | Status: AC
  Administered 2021-01-16: 4 mg via INTRAVENOUS
  Filled 2021-01-16: qty 2

## 2021-01-16 MED ORDER — RACEPINEPHRINE HCL 2.25 % IN NEBU
INHALATION_SOLUTION | RESPIRATORY_TRACT | Status: AC
  Administered 2021-01-16: 0.5 mL
  Filled 2021-01-16: qty 0.5

## 2021-01-16 MED ORDER — POTASSIUM CHLORIDE 10 MEQ/100ML IV SOLN: 10.0000 meq | INTRAVENOUS | Status: DC

## 2021-01-16 MED ORDER — EPINEPHRINE HCL 5 MG/250ML IV SOLN IN NS
0.0000 ug/min | INTRAVENOUS | Status: DC
  Filled 2021-01-16: qty 250

## 2021-01-16 MED ORDER — FUROSEMIDE 10 MG/ML IJ SOLN
60.0000 mg | Freq: Three times a day (TID) | INTRAMUSCULAR | Status: AC
  Administered 2021-01-16 – 2021-01-17 (×3): 60 mg via INTRAVENOUS
  Filled 2021-01-16 (×3): qty 6

## 2021-01-16 MED ORDER — FENTANYL CITRATE PF 50 MCG/ML IJ SOSY
PREFILLED_SYRINGE | INTRAMUSCULAR | Status: AC
  Filled 2021-01-16: qty 2

## 2021-01-16 MED ORDER — MIDAZOLAM HCL 2 MG/2ML IJ SOLN
2.0000 mg | INTRAMUSCULAR | Status: DC | PRN
  Administered 2021-01-17: 2 mg via INTRAVENOUS
  Filled 2021-01-16: qty 2

## 2021-01-16 MED ORDER — BISACODYL 5 MG PO TBEC: 5.0000 mg | DELAYED_RELEASE_TABLET | Freq: Once | ORAL | Status: DC

## 2021-01-16 MED ORDER — CHLORHEXIDINE GLUCONATE 0.12 % MT SOLN
15.0000 mL | Freq: Once | OROMUCOSAL | Status: AC
  Administered 2021-01-17: 15 mL via OROMUCOSAL

## 2021-01-16 MED ORDER — MAGNESIUM SULFATE 2 GM/50ML IV SOLN
2.0000 g | Freq: Once | INTRAVENOUS | Status: AC
  Administered 2021-01-16: 2 g via INTRAVENOUS
  Filled 2021-01-16: qty 50

## 2021-01-16 MED ORDER — CHLORHEXIDINE GLUCONATE 0.12 % MT SOLN
15.0000 mL | Freq: Two times a day (BID) | OROMUCOSAL | Status: DC
  Administered 2021-01-16: 15 mL via OROMUCOSAL
  Filled 2021-01-16: qty 15

## 2021-01-16 MED ORDER — ORAL CARE MOUTH RINSE
15.0000 mL | OROMUCOSAL | Status: DC
  Administered 2021-01-16 – 2021-01-18 (×15): 15 mL via OROMUCOSAL

## 2021-01-16 MED ORDER — SODIUM CHLORIDE 0.9 % IV SOLN
6.2500 mg | Freq: Four times a day (QID) | INTRAVENOUS | Status: DC | PRN
  Administered 2021-01-16: 6.25 mg via INTRAVENOUS
  Filled 2021-01-16 (×2): qty 0.25

## 2021-01-16 MED ORDER — HEPARIN 30,000 UNITS/1000 ML (OHS) CELLSAVER SOLUTION
Status: DC
  Filled 2021-01-16: qty 1000

## 2021-01-16 MED ORDER — FENTANYL CITRATE PF 50 MCG/ML IJ SOSY
PREFILLED_SYRINGE | INTRAMUSCULAR | Status: AC
  Administered 2021-01-16: 200 ug via INTRAVENOUS
  Filled 2021-01-16: qty 2

## 2021-01-16 MED ORDER — BISACODYL 10 MG RE SUPP: 5.0000 mg | Freq: Once | RECTAL | Status: DC

## 2021-01-16 MED ORDER — TRANEXAMIC ACID (OHS) PUMP PRIME SOLUTION
2.0000 mg/kg | INTRAVENOUS | Status: DC
  Filled 2021-01-16: qty 1.8

## 2021-01-16 MED ORDER — CHLORHEXIDINE GLUCONATE CLOTH 2 % EX PADS
6.0000 | MEDICATED_PAD | Freq: Once | CUTANEOUS | Status: AC
  Administered 2021-01-17: 6 via TOPICAL

## 2021-01-16 MED ORDER — INSULIN ASPART 100 UNIT/ML IJ SOLN
0.0000 [IU] | INTRAMUSCULAR | Status: DC
  Administered 2021-01-16 – 2021-01-17 (×4): 4 [IU] via SUBCUTANEOUS
  Administered 2021-01-17 – 2021-01-18 (×4): 3 [IU] via SUBCUTANEOUS
  Administered 2021-01-18: 4 [IU] via SUBCUTANEOUS
  Administered 2021-01-18: 3 [IU] via SUBCUTANEOUS

## 2021-01-16 MED ORDER — CHLORHEXIDINE GLUCONATE CLOTH 2 % EX PADS
6.0000 | MEDICATED_PAD | Freq: Once | CUTANEOUS | Status: AC
  Administered 2021-01-16: 6 via TOPICAL

## 2021-01-16 MED ORDER — TRANEXAMIC ACID 1000 MG/10ML IV SOLN
1.5000 mg/kg/h | INTRAVENOUS | Status: DC
  Filled 2021-01-16: qty 25

## 2021-01-16 NOTE — Procedures (Signed)
Intubation Procedure Note  Morgan Daniels  [id number]  May 22, 1960  Date:01/16/21  Time:7:01 PM   Provider Performing:Sabrina Arriaga P Carlis Abbott    Procedure: Intubation (15830)  Indication(s) Respiratory Failure  Consent Unable to obtain consent due to emergent nature of procedure.   Anesthesia Versed, Fentanyl, and Rocuronium   Time Out Verified patient identification, verified procedure, site/side was marked, verified correct patient position, special equipment/implants available, medications/allergies/relevant history reviewed, required imaging and test results available.   Sterile Technique Usual hand hygeine, masks, and gloves were used   Procedure Description Patient positioned in bed supine.  Sedation given as noted above.  Patient was intubated with endotracheal tube using Glidescope.  View was Grade 1 full glottis .  Number of attempts was 1.  Colorimetric CO2 detector was consistent with tracheal placement.   Complications/Tolerance None; patient tolerated the procedure well. Chest X-ray is ordered to verify placement.   EBL Minimal   Specimen(s) None  Julian Hy, DO 01/16/21 7:01 PM [facility]

## 2021-01-16 NOTE — Progress Notes (Signed)
Procedure(s) (LRB): CORONARY ARTERY BYPASS GRAFTING (CABG) (N/A) TRANSESOPHAGEAL ECHOCARDIOGRAM (TEE) (N/A) Subjective: Extubated.  Denies chest pain.  Objective: Vital signs in last 24 hours: Temp:  [97 F (36.1 C)-99.1 F (37.3 C)] 98 F (36.7 C) (11/30 1100) Pulse Rate:  [71-114] 94 (11/30 1500) Cardiac Rhythm: Normal sinus rhythm (11/30 1200) Resp:  [10-29] 22 (11/30 1500) BP: (96-170)/(48-71) 108/54 (11/30 1400) SpO2:  [94 %-100 %] 94 % (11/30 1500) FiO2 (%):  [40 %] 40 % (11/30 1043) Weight:  [90.1 kg] 90.1 kg (11/30 0500)  Hemodynamic parameters for last 24 hours: CVP:  [9 mmHg-17 mmHg] 9 mmHg  Intake/Output from previous day: 11/29 0701 - 11/30 0700 In: 3016.5 [P.O.:130; I.V.:1195.5; NG/GT:1063; IV Piggyback:628] Out: 3100 [Urine:3100] Intake/Output this shift: Total I/O In: 469.5 [I.V.:340.1; NG/GT:100; IV Piggyback:29.3] Out: 1805 [Urine:1805]  General appearance: distracted Neurologic: Moves all 4 and follows commands, right preference Heart: regular rate and rhythm Lungs: rhonchi bilaterally Abdomen: normal findings: soft, non-tender Extremities: Well perfused  Lab Results: Recent Labs    01/15/21 0412 01/15/21 0828 01/16/21 0338 01/16/21 0639  WBC 15.3*  --  14.0*  --   HGB 9.3*   < > 8.4* 8.2*  HCT 28.6*   < > 25.9* 24.0*  PLT 239  --  157  --    < > = values in this interval not displayed.   BMET:  Recent Labs    01/15/21 1941 01/16/21 0338 01/16/21 0639  NA 136 134* 137  K 4.3 3.9 4.0  CL 102 100  --   CO2 26 27  --   GLUCOSE 213* 191*  --   BUN 27* 26*  --   CREATININE 1.07* 0.83  --   CALCIUM 8.5* 8.7*  --     PT/INR: No results for input(s): LABPROT, INR in the last 72 hours. ABG    Component Value Date/Time   PHART 7.342 (L) 01/16/2021 0639   HCO3 28.3 (H) 01/16/2021 0639   TCO2 30 01/16/2021 0639   ACIDBASEDEF 9.6 (H) 01/12/2021 2122   O2SAT 97.0 01/16/2021 0639   CBG (last 3)  Recent Labs    01/16/21 0750  01/16/21 1113 01/16/21 1609  GLUCAP 230* 189* 142*    Assessment/Plan: S/P Procedure(s) (LRB): CORONARY ARTERY BYPASS GRAFTING (CABG) (N/A) TRANSESOPHAGEAL ECHOCARDIOGRAM (TEE) (N/A) - Left main and three-vessel disease status post MI complicated by pulmonary edema, possible pneumonia, and acute respiratory failure.  Mrs Strahm was extubated earlier today.  She is currently responsive but distracted.  She does follow commands and moves all 4 extremities.  Earlier today she was moving her right much greater than left.  She still has some right preference but he has good strength on the left.  She is only able to converse for a very brief periods.  At this point she has had significant improvement in her respiratory status.  She still has critical left main and three-vessel disease.  This may be her only window of opportunity to proceed with coronary bypass grafting.  Ideally, I would like to wait until she is completed her entire course of antibiotics.  However, I am concerned that she could become ischemic again which could be fatal.  Therefore, I think it is best that we proceed with surgery now even though it is still relatively high risk.  Also would be preferable if her mental status was more clear but it has improved and again delay could potentially be fatal.  I discussed the general nature of the procedure  with the patient and her husband.  I informed them of the need for general anesthesia, the incisions to be used, the use of cardiopulmonary bypass, and the use of drainage tubes and temporary pacemaker wires postoperatively.  I informed him of the indications, risks, benefits, and alternatives.  They understand the risks include, but are not limited to death, MI, DVT, PE, bleeding, possible need for transfusion, stroke, infection, cardiac arrhythmias, respiratory or renal failure, as well as the possibility of other unforeseeable complications.  They wish to proceed.  Would leave IABP in  place and hopefully can remove on postoperative day 1.   LOS: 3 days    [name] 01/16/2021

## 2021-01-16 NOTE — Progress Notes (Signed)
NAME:  Lacole Komorowski, MRN:  [id number], DOB:  1960-06-20, LOS: 3 ADMISSION DATE:  01/13/2021, CONSULTATION DATE:  11/28 REFERRING MD:  Marcelle Smiling, REASON FOR CONSULT:  ventilator assistance   History of Present Illness:  Patient is encephalopathic and/or intubated. Therefore history has been obtained from chart review.  Cintya Daughety is a 60 y.o. female who is seen in consultation at the request of Dr. Marcelle Smiling for recommendations on further evaluation and management of ventilator assistance.   They have a pertnent past medical history of PAD, HTN, HLD, current tobacco abuse, COPD, DM2  Sherena Machorro is an 60 y.o. who presented to the [facility] ED on 01/12/2021 via EMS due to acute respiratory failure and unresponsiveness.  Per ED documentation and EMS report the patient began having difficulty breathing and told her husband that she felt as if she was going to die.  Her husband placed her on his home CPAP machine while calling EMS.  Upon EMSs arrival she was agonal he breathing and significantly hypoxic, they transitioned her to a [facility] airway for transport. The patient never lost pulses. They were intubated in the ED. The patient did not meet STEMI criteria. They were worked up for PE VS PNU. Cefepime and vancomycin was given. CTA negative for PE, CTH negative. The patient was extubated 11/27. The patient later developed respiratory distress, was reintubated, CODE STEMI was called. In cath lab they were found to have a 100% occluded RCA. A IABP was placed. Transferred to [facility] to be managed by cardiology.  PCCM was consulted for ventilatory assistance   Pertinent  Medical History  PAD, HTN, HLD, current tobacco abuse, COPD, DM2, CVA, peptic ulcer,   Significant Hospital Events: Including procedures, antibiotic start and stop dates in addition to other pertinent events   01/12/2021: Patient admitted to [facility] ICU with acute hypoxic respiratory failure requiring emergent intubation and mechanical ventilatory  support secondary to pneumonia versus acute heart failure.  CTA negative for PE, CTh negative. 01/13/21: Patient extubated during the day, ECHO: LVEF 60-65%, no wall motion abnormalities, G1DD. cardiology planned for scheduled LHC. Patient then developed respiratory distress and was emergently re-intubated CODE STEMI called patient taken to cath lab, RCA 100% occluded > IABP placed emergently and patient transferred to [facility] emergently. 11/28 TXR to [facility]. Cardiology primary. PCCM consulted 11/30 responded well to diuresis. EEG done 11/29 no seizure activity. Passed her SBT. Extubated. Diuretics continued. Still on IABP. Cultures from [facility] grew strep agalactiae abx narrowed to ampicillin w/ plan to complete total of 7 days.   Interim History / Subjective:  No distress agitated but wants tube out  Objective   Blood pressure 120/70, pulse 82, temperature (Abnormal) 97 F (36.1 C), temperature source Axillary, resp. rate (Abnormal) 21, weight 90.1 kg, SpO2 99 %. CVP:  [11 mmHg-17 mmHg] 11 mmHg  Vent Mode: PRVC FiO2 (%):  [40 %] 40 % Set Rate:  [20 bmp] 20 bmp Vt Set:  [450 mL] 450 mL PEEP:  [8 cmH20] 8 cmH20 Pressure Support:  [8 cmH20] 8 cmH20 Plateau Pressure:  [17 cmH20-23 cmH20] 23 cmH20   Intake/Output Summary (Last 24 hours) at 01/16/2021 0959 Last data filed at 01/16/2021 0700 Gross per 24 hour  Intake 2730.29 ml  Output 3000 ml  Net -269.71 ml   Filed Weights   01/14/21 0500 01/15/21 0600 01/16/21 0500  Weight: 86.8 kg 89.3 kg 90.1 kg    Examination:   General this is a 60 year old female. She is resting in bed  but agitated at times. Wants tubes out  HENT NCAT no JVD MMM Pulm dec bases. No accessory use. VTs 500-600s on PSV 5/peep 5. No accessory use  Card rrr still on IABP BP stable  Abd soft  Ext warm good pulses. The RLE is in immobilizer  Neuro awake. Agitated. Will follow commands but mostly seems frustrated currently no focal def appreciated.  GU clear yellow    Resolved Hospital Problem list     Assessment & Plan:  NSTEMI in context of Three vessel CAD with 80% distal left main, 95% stenosis ostial LAD, 95% stenosis ostial left circumflex, 100% occluded proximal RCA Cardiogenic shock with IABP in place 11/27 ECHO LVEF 60-65%, 11/27 LHC LVEF now 40% with anteroapical and inferoapical hypokinesis. Plan: Cont tele  Asa/statin NTG infusion  IV heparin  IABP per cards  Acute respiratory failure with hypoxia CAP- Multifocal pneumonia with possible aspiration. Growing Strep agalactiae  Hx COPD Tobacco use -pcxr reviewed. Pulm edema improving. Still w/ bibasilar airspace disease but overall improving -PCT trending down and WBC slowly trending down.  -I&O +233 -passed SBT Plan Extubate BIPAP at bedside Cont lasix (aiming for negative volume status)  Cont BDs Cont precedex (should make transition off vent easier)  Transition to amp and complete 7 day total Rx  Tobacco cessation when able  Hold tubefeeds  Fluid and Electrolyte imbalance: volume overload. Mild hyponatremia Plan Cont lasix Serial labs Replace as needed.   Acute encephalopathy: she had a long period of hypoxemia related to respiratory arrest -EEG suggesting mod diffuse encephalopathy RUE tremor w/out associated epileptiform activity  -mental status improved. Interactive. Agitated but wants tube out  Plan Extubate on dex Does not look like needs CT or MRI at this point   DM2 w/ hyperglycemia Glycemic control remains sub-therapeutic Plan Increase ssi to resistant  Cont semeglee at 25 bid Dc TF coverage  HX HTN HLD Plan Antihypertensive rx addressed above  Statins per above  HX peptic ulcer Plan PPI  Best Practice (right click and "Reselect all SmartList Selections" daily)   Diet/type: NPO w/ meds via tube DVT prophylaxis: systemic heparin GI prophylaxis: PPI Lines: N/A Foley:  Yes, and it is still needed Code Status:  full code Last date of  multidisciplinary goals of care discussion [per primary]   Critical care time: 35 min    Erick Colace ACNP-BC [place] Pager # [phone] OR # [phone] if no answer

## 2021-01-16 NOTE — Progress Notes (Signed)
Mg 1.8 Replaced per protocol  

## 2021-01-16 NOTE — Progress Notes (Addendum)
Mild stridor this evening- treated with racemic epi.   Has had nausea today. Vomited this evening, no increased oxygen requirements after. Will place NGT to decompress. Still has zofran and phenergan for nausea.  Julian Hy, DO 01/16/21 6:36 PM [facility]  Repeat episode of vomiting with concern for aspiration, now with increasing oxygen requirements. She complains of SOB. Reintubated, OGT to suction. Sister updated at bedside and husband updated via phone.  Julian Hy, DO 01/16/21 7:08 PM [place] [facility]

## 2021-01-16 NOTE — Progress Notes (Addendum)
ANTICOAGULATION CONSULT NOTE - Follow Up Consult  Pharmacy Consult for heparin Indication:  IABP   Labs: Recent Labs    01/13/21 1605 01/13/21 1827 01/14/21 0153 01/14/21 2226 01/15/21 0412 01/15/21 0828 01/15/21 1119 01/15/21 1941 01/16/21 0338 01/16/21 0639 01/16/21 1052  HGB  --   --    < > 9.1* 9.3* 8.8*  --   --  8.4* 8.2*  --   HCT  --   --    < > 28.1* 28.6* 26.0*  --   --  25.9* 24.0*  --   PLT  --   --    < > 255 239  --   --   --  157  --   --   HEPARINUNFRC  --  <0.10*   < >  --  0.13*  --    < > 0.18* 0.23*  --  0.12*  CREATININE  --   --    < >  --  0.97  --   --  1.07* 0.83  --   --   TROPONINIHS 2,587* 1,790*  --   --   --   --   --   --   --   --   --    < > = values in this interval not displayed.     Assessment: 60yo female subtherapeutic on heparin after arrival from [facility] cath lab w/ IABP placement, awaiting possible CABG pending clinical improvement. She was extubated today. -heparin level subtherapeutic on 2100 units/hr   Goal of Therapy:  Heparin level 0.2-0.5 units/ml Monitor platelets by anticoagulation protocol: Yes    Plan:  -Increase heparin to 2250 units/hr -Heparin level in 6 hours and daily wth CBC daily  Hildred Laser, PharmD Clinical Pharmacist **Pharmacist phone directory can now be found on [email or web address] ([email or web address]).  Listed under [place].

## 2021-01-16 NOTE — Progress Notes (Addendum)
Progress Note  Patient Name: Morgan Daniels Date of Encounter: 01/16/2021  [facility] Cardiologist: None , [facility]  Subjective   More awake this morning.  Able to follow some commands.  Inpatient Medications    Scheduled Meds:  arformoterol  15 mcg Nebulization BID   aspirin  81 mg Per Tube Daily   atorvastatin  80 mg Per Tube Daily   chlorhexidine gluconate (MEDLINE KIT)  15 mL Mouth Rinse BID   Chlorhexidine Gluconate Cloth  6 each Topical Daily   docusate  100 mg Per Tube BID   feeding supplement (VITAL HIGH PROTEIN)  1,000 mL Per Tube Q24H   insulin aspart  0-15 Units Subcutaneous Q4H   insulin aspart  4 Units Subcutaneous Q4H   insulin glargine-yfgn  25 Units Subcutaneous BID   ipratropium-albuterol  3 mL Nebulization Q6H   mouth rinse  15 mL Mouth Rinse 10 times per day   metoprolol tartrate  12.5 mg Per Tube BID   pantoprazole (PROTONIX) IV  40 mg Intravenous Daily   polyethylene glycol  17 g Per Tube Daily   revefenacin  175 mcg Nebulization Daily   sodium chloride flush  10-40 mL Intracatheter Q12H   Continuous Infusions:  cefTRIAXone (ROCEPHIN)  IV Stopped (01/16/21 3500)   dexmedetomidine (PRECEDEX) IV infusion 0.4 mcg/kg/hr (01/16/21 0700)   doxycycline (VIBRAMYCIN) IV Stopped (01/16/21 0549)   fentaNYL infusion INTRAVENOUS Stopped (01/15/21 0959)   heparin 2,100 Units/hr (01/16/21 0700)   nitroGLYCERIN 10 mcg/min (01/16/21 0700)   PRN Meds: acetaminophen (TYLENOL) oral liquid 160 mg/5 mL, albuterol, fentaNYL (SUBLIMAZE) injection, labetalol, polyethylene glycol, sodium chloride flush   Vital Signs    Vitals:   01/16/21 0645 01/16/21 0700 01/16/21 0800 01/16/21 0806  BP:  (!) 126/59 (!) 121/55 120/68  Pulse: 71 71 72 72  Resp: '18 18 18 ' (!) 21  Temp:  (!) 97 F (36.1 C)    TempSrc:  Axillary    SpO2: 97% 97% 97% 99%  Weight:        Intake/Output Summary (Last 24 hours) at 01/16/2021 0816 Last data filed at 01/16/2021 0700 Gross per  24 hour  Intake 2886.48 ml  Output 3050 ml  Net -163.52 ml   Last 3 Weights 01/16/2021 01/15/2021 01/14/2021  Weight (lbs) 198 lb 10.2 oz 196 lb 13.9 oz 191 lb 5.8 oz  Weight (kg) 90.1 kg 89.3 kg 86.8 kg      Telemetry    Normal sinus rhythm- Personally Reviewed  ECG     Physical Exam   GEN: No acute distress.  Intubated, moving arms Neck: No JVD Cardiac: RRR, no murmurs, rubs, or gallops.  Respiratory: Clear to auscultation bilaterally. GI: Soft, nontender, non-distended  MS: No edema; No deformity. Neuro:  Nonfocal  Psych: Intubated, sedated.  Does answer no when I ask if she will follow commands... Basically saying she does not want to.  Labs    High Sensitivity Troponin:   Recent Labs  Lab 01/13/21 0143 01/13/21 0402 01/13/21 1256 01/13/21 1605 01/13/21 1827  TROPONINIHS 2,500* 2,772* 2,913* 2,587* 1,790*     Chemistry Recent Labs  Lab 01/12/21 2107 01/13/21 0402 01/14/21 2046 01/15/21 0412 01/15/21 0828 01/15/21 1941 01/16/21 0338 01/16/21 0639  NA 135   < > 133* 131*   < > 136 134* 137  K 3.6   < > 4.2 4.4   < > 4.3 3.9 4.0  CL 99   < > 99 97*  --  102 100  --  CO2 18*   < > 26 26  --  26 27  --   GLUCOSE 446*   < > 202* 214*  --  213* 191*  --   BUN 20   < > 22* 26*  --  27* 26*  --   CREATININE 0.96   < > 0.94 0.97  --  1.07* 0.83  --   CALCIUM 9.0   < > 8.2* 8.4*  --  8.5* 8.7*  --   MG 2.3   < > 2.4 2.3  --   --  1.8  --   PROT 7.9  --   --   --   --   --   --   --   ALBUMIN 3.4*  --   --   --   --   --   --   --   AST 39  --   --   --   --   --   --   --   ALT 24  --   --   --   --   --   --   --   ALKPHOS 78  --   --   --   --   --   --   --   BILITOT 0.7  --   --   --   --   --   --   --   GFRNONAA >60   < > >60 >60  --  60* >60  --   ANIONGAP 18*   < > 8 8  --  8 7  --    < > = values in this interval not displayed.    Lipids  Recent Labs  Lab 01/16/21 0338  CHOL 138  TRIG 184*  HDL 30*  LDLCALC 71  CHOLHDL 4.6     Hematology Recent Labs  Lab 01/14/21 2226 01/15/21 0412 01/15/21 0828 01/16/21 0338 01/16/21 0639  WBC 14.1* 15.3*  --  14.0*  --   RBC 3.28* 3.34*  --  3.10*  --   HGB 9.1* 9.3* 8.8* 8.4* 8.2*  HCT 28.1* 28.6* 26.0* 25.9* 24.0*  MCV 85.7 85.6  --  83.5  --   MCH 27.7 27.8  --  27.1  --   MCHC 32.4 32.5  --  32.4  --   RDW 13.2 13.2  --  13.0  --   PLT 255 239  --  157  --    Thyroid No results for input(s): TSH, FREET4 in the last 168 hours.  BNP Recent Labs  Lab 01/13/21 0402 01/15/21 0412  BNP 527.5* 77.3    DDimer No results for input(s): DDIMER in the last 168 hours.   Radiology    DG Chest Port 1 View  Result Date: 01/15/2021 CLINICAL DATA:  Endotracheal tube present EXAM: PORTABLE CHEST 1 VIEW COMPARISON:  01/14/2021 FINDINGS: Endotracheal tube, enteric tube, and right PICC line are present. Shallow inspiration with low lung volumes. Patchy opacities bilaterally. No pleural effusion or pneumothorax. Similar cardiomediastinal contours. IMPRESSION: Persistent bilateral opacities. Aeration at the right lung base is improved. Electronically Signed   By: Macy Mis M.D.   On: 01/15/2021 08:24   EEG adult  Result Date: 01/15/2021 Lora Havens, MD     01/15/2021 11:27 AM Patient Name: Morgan Daniels MRN: [id number] Epilepsy Attending: Lora Havens Referring Physician/Provider: Georgann Housekeeper, NP Date: 01/15/2021 Duration: 23.31 mins Patient history: 60 year old female with altered mental  status and seizure-like episodes. EEG to evaluate for seizure. Level of alertness: lethargic AEDs during EEG study: None Technical aspects: This EEG study was done with scalp electrodes positioned according to the 10-20 International system of electrode placement. Electrical activity was acquired at a sampling rate of '500Hz'  and reviewed with a high frequency filter of '70Hz'  and a low frequency filter of '1Hz' . EEG data were recorded continuously and digitally stored. Description: EEG  showed continuous generalized 3 to 6 Hz theta-delta slowing.  At around 1044, patient was noted to have right hand tremor lasting for few seconds. Concomitant EEG before, during and after the event did not show any EEG changes suggest seizure.  Hyperventilation and photic stimulation were not performed.   ABNORMALITY - Continuous slow, generalized IMPRESSION: This study is suggestive of moderate diffuse encephalopathy, nonspecific etiology.at around 1044, patient was noted to have right hand tremors lasting for few seconds without concomitant EEG change. This was most likely not an epileptic event.  No definite seizures or epileptiform discharges were seen throughout the recording. Priyanka O Yadav   VAS US DOPPLER PRE CABG  Result Date: 01/15/2021 PREOPERATIVE VASCULAR EVALUATION Patient Name:  LYNDSEE CASA  Date of Exam:   01/15/2021 Medical Rec #: [id number]     Accession #:    [id number] Date of Birth: Aug 27, 1960     Patient Gender: F Patient Age:   4 years Exam Location:  [facility] Procedure:      VAS US DOPPLER PRE CABG Referring Phys: Remo Lipps HENDRICKSON --------------------------------------------------------------------------------  Indications:      Pre-CABG. Risk Factors:     None. Limitations:      restricted right arm, restricted left arm, patient immobility,                   patient positioning, lines, bandages, Intra-aortic balloon                   pump in place Comparison Study: No prior studies. Performing Technologist: Carlos Levering RVT  Examination Guidelines: A complete evaluation includes B-mode imaging, spectral Doppler, color Doppler, and power Doppler as needed of all accessible portions of each vessel. Bilateral testing is considered an integral part of a complete examination. Limited examinations for reoccurring indications may be performed as noted.  Right Carotid Findings: +----------+--------+--------+--------+-----------------------+--------+           PSV cm/sEDV  cm/sStenosisDescribe               Comments +----------+--------+--------+--------+-----------------------+--------+ CCA Prox                          smooth and heterogenous         +----------+--------+--------+--------+-----------------------+--------+ CCA Distal                        smooth and heterogenous         +----------+--------+--------+--------+-----------------------+--------+ ICA Prox                          smooth and heterogenous         +----------+--------+--------+--------+-----------------------+--------+  Left Carotid Findings: +----------+--------+--------+--------+-----------------------+--------+           PSV cm/sEDV cm/sStenosisDescribe               Comments +----------+--------+--------+--------+-----------------------+--------+ CCA Prox  smooth and heterogenous         +----------+--------+--------+--------+-----------------------+--------+ CCA Distal                        smooth and heterogenous         +----------+--------+--------+--------+-----------------------+--------+ ICA Prox                          smooth and heterogenous         +----------+--------+--------+--------+-----------------------+--------+  ABI Findings: +--------+------------------+-----+--------+-------------------+ Right   Rt Pressure (mmHg)IndexWaveformComment             +--------+------------------+-----+--------+-------------------+ Brachial                               Restricted arm      +--------+------------------+-----+--------+-------------------+ PTA                                    Unable to visualize +--------+------------------+-----+--------+-------------------+ DP      94                0.65         Balloon pump        +--------+------------------+-----+--------+-------------------+ +--------+------------------+-----+--------+--------------+ Left    Lt Pressure  (mmHg)IndexWaveformComment        +--------+------------------+-----+--------+--------------+ [id number]                            Balloon pump   +--------+------------------+-----+--------+--------------+ PTA                                    Restricted leg +--------+------------------+-----+--------+--------------+ DP                                     Restricted leg +--------+------------------+-----+--------+--------------+  Right Doppler Findings: +-----------+--------+-----+-------+--------------+ Site       PressureIndexDopplerComments       +-----------+--------+-----+-------+--------------+ Brachial                       Restricted arm +-----------+--------+-----+-------+--------------+ Radial                         Restricted arm +-----------+--------+-----+-------+--------------+ Ulnar                          Restricted arm +-----------+--------+-----+-------+--------------+ Palmar Arch                    Restricted arm +-----------+--------+-----+-------+--------------+  Left Doppler Findings: +--------+--------+-----+-------+------------+ Site    PressureIndexDopplerComments     +--------+--------+-----+-------+------------+ [id number]                 Balloon pump +--------+--------+-----+-------+------------+ Radial                      Balloon pump +--------+--------+-----+-------+------------+ Ulnar                       Balloon pump +--------+--------+-----+-------+------------+  Summary: Right Carotid: Unable to accurately measure waveforms due to the presence of a  balloon pump. Left Carotid: Unable to accurately measure waveforms due to the presence of a               balloon pump. Right ABI: Resting right ankle-brachial index indicates moderate right lower extremity arterial disease. Unable to assess waveforms due to the presence of a balloon pump. Left ABI: Unable to assess waveforms due to the  presence of a balloon pump. Left Upper Extremity: Doppler waveform obliterate with left radial compression. Doppler waveforms decrease >50% with left ulnar compression.  Electronically signed by Harold Barban MD on 01/15/2021 at 7:03:16 PM.    Final    [place] EKG SITE RITE  Result Date: 01/14/2021 If Site Rite image not attached, placement could not be confirmed due to current cardiac rhythm.   Cardiac Studies   Cath films and echo reviewed  Patient Profile     60 y.o. female with complex CAD and cardiogenic shock  Assessment & Plan    CAD/NSTEMI: Severe CAD.  Awaiting improvement of respiratory status and mental status prior to CABG.  IABP in place.  Ideally, this would be kept in until surgery later this week given the complex coronary disease which led to cardiogenic shock at [facility].  Hemoglobin is trending down.  We will have to consider transfusion if her hemoglobin drops below 8.  Hyperlipidemia: Triglycerides improved this morning.  LDL 71.  Continue lipid-lowering therapy.  Continue aggressive diuresis.  Although EF appeared low at the time of cath, EF with balloon pump in place is significantly improved.  This is encouraging that her myocardium is viable.  Renal function is stable.  We will continue with aggressive diuresis to help respiratory status.  We will see if she can be extubated per critical care.  Depending on timing of surgery, will adjust plan with balloon pump.  If surgery is not going to happen until next week, I think we will need to remove the balloon pump prior to surgery.  If it is going to happen later this week, we will plan to leave it in place.    For questions or updates, please contact [place] Please consult [email or web address] for contact info under        Signed, Larae Grooms, MD  01/16/2021, 8:16 AM

## 2021-01-16 NOTE — Procedures (Signed)
Extubation Procedure Note  Patient Details:   Name: Morgan Daniels DOB: [birth year] MRN: [id number]   Airway Documentation:    Vent end date: 01/16/21 Vent end time: 1055   Evaluation  O2 sats: stable throughout Complications: No apparent complications Patient did tolerate procedure well. Bilateral Breath Sounds: Clear, Diminished   Yes  Patient was extubated to a 4L [place] without any complications, dyspnea or stridor noted. Positive cuff leak prior to extubation. Patient was instructed on IS but was uncooperative.   Roosevelt Bisher, Eddie North 01/16/2021, 10:55 AM

## 2021-01-16 NOTE — Progress Notes (Addendum)
[place] [place] Progress Note Patient Name: Morgan Daniels DOB: 23-Mar-1960 MRN: [id number]   Date of Service  01/16/2021  HPI/Events of Note  Notified of post-intubation ABG results - 7.502/42.8/209 on PRVC 100%, PEEP 8, RR 20, 450.   CXR shows ETT about 3cm above the carina.   eICU Interventions  Agree with FIO2 down to 40%. Decrease RR to 16 as well.        Intervention Category Intermediate Interventions: Other:  Elsie Lincoln 01/16/2021, 9:24 PM  9:40 PM K 3.4, crea 0.74.  Pt had multiple episodes of emesis today.   Plan> Replete K - KCL 62meq x 4 via PICC ordered.

## 2021-01-16 NOTE — Procedures (Signed)
OGT placed under direct visualization of glidescope. KUB ordered to confirm placement. + sounds over LUQ and bowel contents returned from tube.  Julian Hy, DO 01/16/21 7:08 PM [place] [facility]

## 2021-01-16 NOTE — Progress Notes (Signed)
ANTICOAGULATION CONSULT NOTE - Follow Up Consult  Pharmacy Consult for heparin Indication:  IABP  Labs: Recent Labs    01/13/21 1256 01/13/21 1605 01/13/21 1827 01/14/21 0153 01/14/21 2226 01/15/21 0412 01/15/21 0828 01/15/21 1119 01/15/21 1941 01/16/21 0338 01/16/21 0639  HGB  --   --   --    < > 9.1* 9.3* 8.8*  --   --  8.4* 8.2*  HCT  --   --   --    < > 28.1* 28.6* 26.0*  --   --  25.9* 24.0*  PLT  --   --   --    < > 255 239  --   --   --  157  --   HEPARINUNFRC  --   --  <0.10*   < >  --  0.13*  --  0.16* 0.18* 0.23*  --   CREATININE  --   --   --    < >  --  0.97  --   --  1.07* 0.83  --   TROPONINIHS 2,913* 2,587* 1,790*  --   --   --   --   --   --   --   --    < > = values in this interval not displayed.     Assessment: 60yo female subtherapeutic on heparin after arrival from [facility] cath lab w/ IABP placement, awaiting possible CABG pending clinical improvement.   Heparin level therapeutic at 0.23 on 2100 units/hr. Hgb continues to trend down. No s/sx of bleeding noted. Unable to hold heparin given IABP placement.  Goal of Therapy:  Heparin level 0.2-0.5 units/ml   Plan:  Continue heparin rate at 2100 units/hr 6h confirmatory heparin level  Monitor CBC, heparin level, and s/sx of bleeding daily.   Joseph Art, Pharm.D. PGY-1 Pharmacy Resident 01/16/2021 7:33 AM

## 2021-01-16 NOTE — Progress Notes (Signed)
ANTICOAGULATION CONSULT NOTE  Pharmacy Consult for heparin Indication:  IABP  Heparin dosing weight = 79 kg  Labs: Recent Labs    01/14/21 2226 01/15/21 0412 01/15/21 0828 01/15/21 1941 01/16/21 0338 01/16/21 0639 01/16/21 1052 01/16/21 1844 01/16/21 2027 01/16/21 2041  HGB 9.1* 9.3*   < >  --  8.4* 8.2*  --  9.2* 8.5*  --   HCT 28.1* 28.6*   < >  --  25.9* 24.0*  --  27.0* 25.0*  --   PLT 255 239  --   --  157  --   --   --   --   --   HEPARINUNFRC  --  0.13*   < > 0.18* 0.23*  --  0.12*  --   --  0.13*  CREATININE  --  0.97  --  1.07* 0.83  --   --   --   --  0.74   < > = values in this interval not displayed.     Assessment: 60yo female subtherapeutic on heparin after arrival from [facility] cath lab w/ IABP placement, awaiting possible CABG pending clinical improvement. No interruption nor issue with heparin infusion per RN.  No bleeding reported.  Goal of Therapy:  Heparin level 0.2-0.5 units/ml Monitor platelets by anticoagulation protocol: Yes    Plan:  -Increase heparin to 2400 units/hr -F/u AM labs  Kadarious Dikes D. Mina Marble, PharmD, BCPS, [place] 01/16/2021, 9:28 PM

## 2021-01-17 ENCOUNTER — Inpatient Hospital Stay (HOSPITAL_COMMUNITY): Payer: Medicare Other

## 2021-01-17 ENCOUNTER — Telehealth: Payer: Self-pay

## 2021-01-17 ENCOUNTER — Encounter (HOSPITAL_COMMUNITY): Payer: Self-pay | Admitting: Internal Medicine

## 2021-01-17 ENCOUNTER — Encounter (HOSPITAL_COMMUNITY)
Admission: AD | Disposition: A | Discharge status: Rehabilitation Facility | Payer: Self-pay | Source: Other Acute Inpatient Hospital | Attending: Thoracic Surgery (Cardiothoracic Vascular Surgery)

## 2021-01-17 DIAGNOSIS — R570 Cardiogenic shock: Secondary | ICD-10-CM | POA: Diagnosis not present

## 2021-01-17 DIAGNOSIS — J690 Pneumonitis due to inhalation of food and vomit: Secondary | ICD-10-CM

## 2021-01-17 DIAGNOSIS — I25110 Atherosclerotic heart disease of native coronary artery with unstable angina pectoris: Secondary | ICD-10-CM | POA: Diagnosis not present

## 2021-01-17 DIAGNOSIS — J810 Acute pulmonary edema: Secondary | ICD-10-CM | POA: Diagnosis not present

## 2021-01-17 DIAGNOSIS — I214 Non-ST elevation (NSTEMI) myocardial infarction: Secondary | ICD-10-CM | POA: Diagnosis not present

## 2021-01-17 LAB — BASIC METABOLIC PANEL
Anion gap: 10 (ref 5–15)
Anion gap: 10 (ref 5–15)
BUN: 28 mg/dL — ABNORMAL HIGH (ref 6–20)
BUN: 31 mg/dL — ABNORMAL HIGH (ref 6–20)
CO2: 31 mmol/L (ref 22–32)
CO2: 30 mmol/L (ref 22–32)
Calcium: 9.4 mg/dL (ref 8.9–10.3)
Calcium: 8.7 mg/dL — ABNORMAL LOW (ref 8.9–10.3)
Chloride: 96 mmol/L — ABNORMAL LOW (ref 98–111)
Chloride: 101 mmol/L (ref 98–111)
Creatinine, Ser: 0.76 mg/dL (ref 0.44–1.00)
Creatinine, Ser: 0.86 mg/dL (ref 0.44–1.00)
GFR, Estimated: >60 mL/min (ref >60)
GFR, Estimated: >60 mL/min (ref >60)
Glucose, Bld: 144 mg/dL — ABNORMAL HIGH (ref 70–99)
Glucose, Bld: 123 mg/dL — ABNORMAL HIGH (ref 70–99)
Potassium: 3.9 mmol/L (ref 3.5–5.1)
Potassium: 3.8 mmol/L (ref 3.5–5.1)
Sodium: 137 mmol/L (ref 135–145)
Sodium: 141 mmol/L (ref 135–145)

## 2021-01-17 LAB — GLUCOSE, CAPILLARY
Glucose-Capillary: 136 mg/dL — ABNORMAL HIGH (ref 70–99)
Glucose-Capillary: 112 mg/dL — ABNORMAL HIGH (ref 70–99)
Glucose-Capillary: 114 mg/dL — ABNORMAL HIGH (ref 70–99)
Glucose-Capillary: 136 mg/dL — ABNORMAL HIGH (ref 70–99)
Glucose-Capillary: 126 mg/dL — ABNORMAL HIGH (ref 70–99)
Glucose-Capillary: 118 mg/dL — ABNORMAL HIGH (ref 70–99)

## 2021-01-17 LAB — CBC
HCT: 26.4 % — ABNORMAL LOW (ref 36.0–46.0)
HCT: 26.1 % — ABNORMAL LOW (ref 36.0–46.0)
Hemoglobin: 8.8 g/dL — ABNORMAL LOW (ref 12.0–15.0)
Hemoglobin: 8.6 g/dL — ABNORMAL LOW (ref 12.0–15.0)
MCH: 27.2 pg (ref 26.0–34.0)
MCH: 27.7 pg (ref 26.0–34.0)
MCHC: 33.3 g/dL (ref 30.0–36.0)
MCHC: 33 g/dL (ref 30.0–36.0)
MCV: 81.7 fL (ref 80.0–100.0)
MCV: 83.9 fL (ref 80.0–100.0)
Platelets: 130 10*3/uL — ABNORMAL LOW (ref 150–400)
Platelets: 125 10*3/uL — ABNORMAL LOW (ref 150–400)
RBC: 3.23 MIL/uL — ABNORMAL LOW (ref 3.87–5.11)
RBC: 3.11 MIL/uL — ABNORMAL LOW (ref 3.87–5.11)
RDW: 12.8 % (ref 11.5–15.5)
RDW: 12.8 % (ref 11.5–15.5)
WBC: 12.1 10*3/uL — ABNORMAL HIGH (ref 4.0–10.5)
WBC: 11.6 10*3/uL — ABNORMAL HIGH (ref 4.0–10.5)
nRBC: 0 % (ref 0.0–0.2)
nRBC: 0.2 % (ref 0.0–0.2)

## 2021-01-17 LAB — POCT I-STAT 7, (LYTES, BLD GAS, ICA,H+H)
Acid-Base Excess: 8 mmol/L — ABNORMAL HIGH (ref 0.0–2.0)
Bicarbonate: 32.7 mmol/L — ABNORMAL HIGH (ref 20.0–28.0)
Calcium, Ion: 1.26 mmol/L (ref 1.15–1.40)
HCT: 26 % — ABNORMAL LOW (ref 36.0–46.0)
Hemoglobin: 8.8 g/dL — ABNORMAL LOW (ref 12.0–15.0)
O2 Saturation: 95 %
Patient temperature: 96.5
Potassium: 3.5 mmol/L (ref 3.5–5.1)
Sodium: 139 mmol/L (ref 135–145)
TCO2: 34 mmol/L — ABNORMAL HIGH (ref 22–32)
pCO2 arterial: 43.3 mmHg (ref 32.0–48.0)
pH, Arterial: 7.482 — ABNORMAL HIGH (ref 7.350–7.450)
pO2, Arterial: 67 mmHg — ABNORMAL LOW (ref 83.0–108.0)

## 2021-01-17 LAB — MAGNESIUM
Magnesium: 1.8 mg/dL (ref 1.7–2.4)
Magnesium: 1.9 mg/dL (ref 1.7–2.4)

## 2021-01-17 LAB — HEPARIN LEVEL (UNFRACTIONATED)
Heparin Unfractionated: 0.24 IU/mL — ABNORMAL LOW (ref 0.30–0.70)
Heparin Unfractionated: 0.26 IU/mL — ABNORMAL LOW (ref 0.30–0.70)

## 2021-01-17 LAB — COOXEMETRY PANEL
Carboxyhemoglobin: 1.3 % (ref 0.5–1.5)
Methemoglobin: 1 % (ref 0.0–1.5)
O2 Saturation: 70 %
Total hemoglobin: 9.1 g/dL — ABNORMAL LOW (ref 12.0–16.0)

## 2021-01-17 LAB — PHOSPHORUS: Phosphorus: 2.9 mg/dL (ref 2.5–4.6)

## 2021-01-17 SURGERY — CORONARY ARTERY BYPASS GRAFTING (CABG)
Anesthesia: General

## 2021-01-17 MED ORDER — SORBITOL 70 % SOLN
30.0000 mL | Freq: Once | Status: AC
  Administered 2021-01-17: 30 mL via ORAL
  Filled 2021-01-17: qty 30

## 2021-01-17 MED ORDER — FENTANYL CITRATE (PF) 250 MCG/5ML IJ SOLN
INTRAMUSCULAR | Status: AC
  Filled 2021-01-17: qty 5

## 2021-01-17 MED ORDER — TRANEXAMIC ACID (OHS) PUMP PRIME SOLUTION
2.0000 mg/kg | INTRAVENOUS | Status: DC
  Filled 2021-01-17: qty 1.79

## 2021-01-17 MED ORDER — PLASMA-LYTE A IV SOLN
INTRAVENOUS | Status: DC
  Filled 2021-01-17: qty 5

## 2021-01-17 MED ORDER — NOREPINEPHRINE 4 MG/250ML-% IV SOLN
0.0000 ug/min | INTRAVENOUS | Status: DC
  Filled 2021-01-17: qty 250

## 2021-01-17 MED ORDER — EPINEPHRINE HCL 5 MG/250ML IV SOLN IN NS
0.0000 ug/min | INTRAVENOUS | Status: DC
  Filled 2021-01-17: qty 250

## 2021-01-17 MED ORDER — HEPARIN 30,000 UNITS/1000 ML (OHS) CELLSAVER SOLUTION
Status: DC
  Filled 2021-01-17: qty 1000

## 2021-01-17 MED ORDER — PHENYLEPHRINE HCL-NACL 20-0.9 MG/250ML-% IV SOLN
30.0000 ug/min | INTRAVENOUS | Status: AC
  Administered 2021-01-18: 25 ug/min via INTRAVENOUS
  Filled 2021-01-17: qty 250

## 2021-01-17 MED ORDER — MAGNESIUM SULFATE 50 % IJ SOLN
40.0000 meq | INTRAMUSCULAR | Status: DC
  Filled 2021-01-17: qty 9.85

## 2021-01-17 MED ORDER — FUROSEMIDE 10 MG/ML IJ SOLN
60.0000 mg | Freq: Three times a day (TID) | INTRAMUSCULAR | Status: AC
  Administered 2021-01-17 – 2021-01-18 (×3): 60 mg via INTRAVENOUS
  Filled 2021-01-17 (×3): qty 6

## 2021-01-17 MED ORDER — METOPROLOL TARTRATE 12.5 MG HALF TABLET: 12.5000 mg | ORAL_TABLET | Freq: Once | ORAL | Status: DC

## 2021-01-17 MED ORDER — DEXMEDETOMIDINE HCL IN NACL 400 MCG/100ML IV SOLN
0.1000 ug/kg/h | INTRAVENOUS | Status: AC
  Administered 2021-01-18: .5 ug/kg/h via INTRAVENOUS
  Filled 2021-01-17 (×2): qty 100

## 2021-01-17 MED ORDER — SENNA 8.6 MG PO TABS
1.0000 | ORAL_TABLET | Freq: Two times a day (BID) | ORAL | Status: DC
  Administered 2021-01-17 (×2): 8.6 mg
  Filled 2021-01-17 (×2): qty 1

## 2021-01-17 MED ORDER — PROPOFOL 10 MG/ML IV BOLUS
INTRAVENOUS | Status: AC
  Filled 2021-01-17: qty 20

## 2021-01-17 MED ORDER — CHLORHEXIDINE GLUCONATE CLOTH 2 % EX PADS
6.0000 | MEDICATED_PAD | Freq: Once | CUTANEOUS | Status: AC
  Administered 2021-01-18: 6 via TOPICAL

## 2021-01-17 MED ORDER — VANCOMYCIN HCL 1500 MG/300ML IV SOLN
1500.0000 mg | INTRAVENOUS | Status: AC
  Administered 2021-01-18: 1500 mg via INTRAVENOUS
  Filled 2021-01-17: qty 300

## 2021-01-17 MED ORDER — BISACODYL 5 MG PO TBEC: 5.0000 mg | DELAYED_RELEASE_TABLET | Freq: Once | ORAL | Status: DC

## 2021-01-17 MED ORDER — CEFAZOLIN SODIUM-DEXTROSE 2-4 GM/100ML-% IV SOLN
2.0000 g | INTRAVENOUS | Status: AC
  Administered 2021-01-18 (×2): 2 g via INTRAVENOUS
  Filled 2021-01-17: qty 100

## 2021-01-17 MED ORDER — TRANEXAMIC ACID (OHS) BOLUS VIA INFUSION
15.0000 mg/kg | INTRAVENOUS | Status: AC
  Administered 2021-01-18: 1344 mg via INTRAVENOUS
  Filled 2021-01-17: qty 1344

## 2021-01-17 MED ORDER — PIPERACILLIN-TAZOBACTAM 3.375 G IVPB
3.3750 g | Freq: Three times a day (TID) | INTRAVENOUS | Status: AC
  Administered 2021-01-17 – 2021-01-22 (×14): 3.375 g via INTRAVENOUS
  Filled 2021-01-17 (×14): qty 50

## 2021-01-17 MED ORDER — INSULIN REGULAR(HUMAN) IN NACL 100-0.9 UT/100ML-% IV SOLN
INTRAVENOUS | Status: AC
  Administered 2021-01-18: 5 [IU]/h via INTRAVENOUS
  Filled 2021-01-17: qty 100

## 2021-01-17 MED ORDER — POTASSIUM CHLORIDE 10 MEQ/50ML IV SOLN
10.0000 meq | INTRAVENOUS | Status: AC
  Administered 2021-01-17 (×4): 10 meq via INTRAVENOUS
  Filled 2021-01-17 (×4): qty 50

## 2021-01-17 MED ORDER — MILRINONE LACTATE IN DEXTROSE 20-5 MG/100ML-% IV SOLN
0.3000 ug/kg/min | INTRAVENOUS | Status: DC
  Filled 2021-01-17: qty 100

## 2021-01-17 MED ORDER — POLYETHYLENE GLYCOL 3350 17 G PO PACK
17.0000 g | PACK | Freq: Two times a day (BID) | ORAL | Status: DC
  Administered 2021-01-17 (×2): 17 g
  Filled 2021-01-17 (×2): qty 1

## 2021-01-17 MED ORDER — POTASSIUM CHLORIDE 2 MEQ/ML IV SOLN
80.0000 meq | INTRAVENOUS | Status: DC
  Filled 2021-01-17: qty 40

## 2021-01-17 MED ORDER — BISACODYL 10 MG RE SUPP
10.0000 mg | Freq: Every day | RECTAL | Status: DC | PRN
  Administered 2021-01-17: 10 mg via RECTAL
  Filled 2021-01-17: qty 1

## 2021-01-17 MED ORDER — METOCLOPRAMIDE HCL 5 MG/ML IJ SOLN
5.0000 mg | Freq: Four times a day (QID) | INTRAMUSCULAR | Status: DC
  Administered 2021-01-17 – 2021-01-18 (×4): 5 mg via INTRAVENOUS
  Filled 2021-01-17 (×4): qty 2

## 2021-01-17 MED ORDER — CEFAZOLIN SODIUM-DEXTROSE 2-4 GM/100ML-% IV SOLN
2.0000 g | INTRAVENOUS | Status: DC
  Filled 2021-01-17: qty 100

## 2021-01-17 MED ORDER — CHLORHEXIDINE GLUCONATE 0.12 % MT SOLN: 15.0000 mL | Freq: Once | OROMUCOSAL | Status: AC

## 2021-01-17 MED ORDER — CHLORHEXIDINE GLUCONATE CLOTH 2 % EX PADS: 6.0000 | MEDICATED_PAD | Freq: Once | CUTANEOUS | Status: DC

## 2021-01-17 MED ORDER — NITROGLYCERIN IN D5W 200-5 MCG/ML-% IV SOLN
2.0000 ug/min | INTRAVENOUS | Status: AC
  Administered 2021-01-18: 10 ug/min via INTRAVENOUS
  Administered 2021-01-18: 25 ug/min via INTRAVENOUS
  Filled 2021-01-17: qty 250

## 2021-01-17 MED ORDER — TRANEXAMIC ACID 1000 MG/10ML IV SOLN
1.5000 mg/kg/h | INTRAVENOUS | Status: AC
  Administered 2021-01-18: 1.5 mg/kg/h via INTRAVENOUS
  Filled 2021-01-17: qty 25

## 2021-01-17 MED ORDER — PIPERACILLIN-TAZOBACTAM 3.375 G IVPB 30 MIN
3.3750 g | Freq: Once | INTRAVENOUS | Status: AC
  Administered 2021-01-17: 3.375 g via INTRAVENOUS
  Filled 2021-01-17 (×2): qty 50

## 2021-01-17 MED ORDER — MIDAZOLAM HCL (PF) 10 MG/2ML IJ SOLN
INTRAMUSCULAR | Status: AC
  Filled 2021-01-17: qty 2

## 2021-01-17 MED ORDER — MAGNESIUM SULFATE 2 GM/50ML IV SOLN
2.0000 g | Freq: Once | INTRAVENOUS | Status: AC
  Administered 2021-01-17: 2 g via INTRAVENOUS
  Filled 2021-01-17: qty 50

## 2021-01-17 NOTE — Progress Notes (Signed)
NAME:  Morgan Daniels, MRN:  [id number], DOB:  [birth year], LOS: 4 ADMISSION DATE:  01/13/2021, CONSULTATION DATE:  11/28 REFERRING MD:  Morgan Daniels, REASON FOR CONSULT:  ventilator assistance   History of Present Illness:  Patient is encephalopathic and/or intubated. Therefore history has been obtained from chart review.  Morgan Daniels is a 60 y.o. female who is seen in consultation at the request of Dr. Marcelle Daniels for recommendations on further evaluation and management of ventilator assistance.   They have a pertnent past medical history of PAD, HTN, HLD, current tobacco abuse, COPD, DM2  Morgan Daniels is an 60 y.o. who presented to the [facility] ED on 01/12/2021 via EMS due to acute respiratory failure and unresponsiveness.  Per ED documentation and EMS report the patient began having difficulty breathing and told her husband that she felt as if she was going to die.  Her husband placed her on his home CPAP machine while calling EMS.  Upon EMSs arrival she was agonal he breathing and significantly hypoxic, they transitioned her to a [facility] airway for transport. The patient never lost pulses. They were intubated in the ED. The patient did not meet STEMI criteria. They were worked up for PE VS PNU. Cefepime and vancomycin was given. CTA negative for PE, CTH negative. The patient was extubated 11/27. The patient later developed respiratory distress, was reintubated, CODE STEMI was called. In cath lab they were found to have a 100% occluded RCA. A IABP was placed. Transferred to [facility] to be managed by cardiology.  PCCM was consulted for ventilatory assistance   Pertinent  Medical History  PAD, HTN, HLD, current tobacco abuse, COPD, DM2, CVA, peptic ulcer,   Significant Hospital Events: Including procedures, antibiotic start and stop dates in addition to other pertinent events   01/12/2021: Patient admitted to [facility] ICU with acute hypoxic respiratory failure requiring emergent intubation and mechanical ventilatory  support secondary to pneumonia versus acute heart failure.  CTA negative for PE, CTh negative. 01/13/21: Patient extubated during the day, ECHO: LVEF 60-65%, no wall motion abnormalities, G1DD. cardiology planned for scheduled LHC. Patient then developed respiratory distress and was emergently re-intubated CODE STEMI called patient taken to cath lab, RCA 100% occluded > IABP placed emergently and patient transferred to [facility] emergently. 11/28 TXR to [facility]. Cardiology primary. PCCM consulted 11/30 responded well to diuresis. EEG done 11/29 no seizure activity. Passed her SBT. Extubated. Diuretics continued. Still on IABP. Cultures from [facility] grew strep agalactiae abx narrowed to ampicillin w/ plan to complete total of 7 days.   Interim History / Subjective:  [name] denies complaints. NGT output remains high overnight. Reintubated for worsening respiratory status after multiple vomiting episodes yesterday.  Objective   Blood pressure 121/60, pulse 66, temperature 99 F (37.2 C), temperature source Oral, resp. rate 16, height 5\' 6"  (1.676 m), weight 89.6 kg, SpO2 99 %. CVP:  [6 mmHg-10 mmHg] 6 mmHg  Vent Mode: PRVC FiO2 (%):  [40 %-100 %] 50 % Set Rate:  [16 bmp-20 bmp] 16 bmp Vt Set:  [450 mL] 450 mL PEEP:  [5 cmH20-8 cmH20] 8 cmH20 Pressure Support:  [5 cmH20] 5 cmH20 Plateau Pressure:  [12 cmH20-29 cmH20] 21 cmH20   Intake/Output Summary (Last 24 hours) at 01/17/2021 0913 Last data filed at 01/17/2021 0800 Gross per 24 hour  Intake 1907.84 ml  Output 5355 ml  Net -3447.16 ml    Filed Weights   01/15/21 0600 01/16/21 0500 01/17/21 0530  Weight: 89.3 kg 90.1 kg 89.6 kg  Net -2.79L for admission  Examination:  General: critically ill appearing woman lying in bed in NAD HENT: [place]/AT, eyes anicteric Pulm : rhonchi, thick secretions Card: S1S2, mechanical IABP sounds Abd: soft, NT Ext: RLE IABP, minimal dried blood on dressing, warm, no edema. 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 11/27 ECHO LVEF 60-65%, 11/27 LHC LVEF now 40% with anteroapical and inferoapical hypokinesis. -IABP per cardiology -Con't aspirin, metoprolol -Planning for CABG potentially tomorrow if she remains relatively stable on the vent today.  High-risk given prolonged hospitalization and recurrent need for MV this admission. -Tele monitoring, maintain optimal electrolytes. -needs to quit smoking  Acute respiratory failure with hypoxia due to acute pulmonary edema. CAP- Multifocal pneumonia with possible aspiration. Growing Strep agalactiae pneumonia at admission Hx COPD Tobacco use Aspiration pneumonitis vs pneumonia -LTVV, 4-8cc/kg IBW with goal Pplat<30 and DP<15 -PAD protocol -VAP prevention protocol -Repeat trach aspirate. Escalate antibiotics to zosyn -con't diuresis -no plans to extubate pre-operatively  -con't brovana, yupelri plus albuterol PRN  Ileus -reglan -increase bowel regimen -hold TF  Acute encephalopathy> resolved. -monitor -avoid delirium-producing meds  DM2 w/ hyperglycemia- controlled -con't levemir 25 units BID -SSI PRN -goal BG 140-180  HX HTN HLD -statin daily -con't metoprolol  HX peptic ulcer -PPI  Best Practice (right click and "Reselect all SmartList Selections" daily)   Diet/type: NPO w/ meds via tube DVT prophylaxis: systemic heparin GI prophylaxis: PPI Lines: Central line and Arterial Line Foley:  Yes, and it is still needed Code Status:  full code Last date of multidisciplinary goals of care discussion [per primary]    This patient is critically ill with multiple organ system failure  which requires frequent high complexity decision making, assessment, support, evaluation, and titration of therapies. This was completed through the application of advanced monitoring technologies and extensive interpretation of multiple databases. During this encounter critical care time was devoted to patient care services described in this note for 35 minutes.  Julian Hy, DO 01/17/21 9:34 AM [facility]

## 2021-01-17 NOTE — Care Management (Signed)
01-17-21 1137 Case Manager received a consult regarding FMLA paperwork. Case Manager spoke with husband in regards to [facility] paperwork- plan will be for him to deliver them to [place] and they will be filled out accordingly. No further needs identified at this time. Case Manager will continue to follow for additional transition of care needs.

## 2021-01-17 NOTE — Plan of Care (Signed)
?  Problem: Coping: ?Goal: Level of anxiety will decrease ?Outcome: Progressing ?  ?Problem: Safety: ?Goal: Ability to remain free from injury will improve ?Outcome: Progressing ?  ?

## 2021-01-17 NOTE — Progress Notes (Addendum)
ANTICOAGULATION CONSULT NOTE  Pharmacy Consult for heparin Indication:  IABP  Heparin dosing weight = 79 kg  Labs: Recent Labs    01/15/21 0412 01/15/21 0828 01/16/21 0338 01/16/21 0639 01/16/21 1052 01/16/21 1844 01/16/21 2027 01/16/21 2041 01/16/21 2138 01/17/21 0410 01/17/21 0432 01/17/21 0735  HGB 9.3*   < > 8.4*   < >  --    < > 8.5*  --   --  8.8* 8.8*  --   HCT 28.6*   < > 25.9*   < >  --    < > 25.0*  --   --  26.4* 26.0*  --   PLT 239  --  157  --   --   --   --   --   --  130*  --   --   LABPROT  --   --   --   --   --   --   --   --  14.7  --   --   --   INR  --   --   --   --   --   --   --   --  1.2  --   --   --   HEPARINUNFRC 0.13*   < > 0.23*  --  0.12*  --   --  0.13*  --   --   --  0.24*  CREATININE 0.97   < > 0.83  --   --   --   --  0.74  --  0.76  --   --    < > = values in this interval not displayed.     Assessment: 60yo female subtherapeutic on heparin after arrival from [facility] cath lab w/ IABP placement, awaiting possible CABG pending clinical improvement.  -heparin level= 0.24 on 2400 units/hr  Goal of Therapy:  Heparin level 0.2-0.5 units/ml Monitor platelets by anticoagulation protocol: Yes    Plan:  -Continue heparin 2400 units/hr -Recheck heparin level later today   Hildred Laser, PharmD Clinical Pharmacist **Pharmacist phone directory can now be found on [email or web address] ([email or web address]).  Listed under [place].

## 2021-01-17 NOTE — Progress Notes (Signed)
      [address]       [place] [address]             [phone]      Intubated, sedated  BP (!) 98/37   Pulse 74   Temp 100.2 F (37.9 C) (Oral)   Resp 16   Ht 5\' 6"  (1.676 m)   Wt 89.6 kg   SpO2 99%   BMI 31.88 kg/m  IABP 1:1  Intake/Output Summary (Last 24 hours) at 01/17/2021 1738 Last data filed at 01/17/2021 1700 Gross per 24 hour  Intake 2415.57 ml  Output 4495 ml  Net -2079.43 ml   Has had a relatively stable day  FiO2 down to 40%  OR cancelled today for concern for aspiration. Watching for fevers, sepsis, ALI/ARDS  Plan OR tomorrow noon if no deterioration over night-  Recheck labs and CXR in AM  D/w Dr. Lorenza Cambridge C. Roxan Hockey, MD [facility] [phone]

## 2021-01-17 NOTE — Progress Notes (Signed)
ANTICOAGULATION CONSULT NOTE  Pharmacy Consult for heparin Indication:  IABP  Heparin dosing weight = 79 kg  Labs: Recent Labs    01/15/21 0412 01/15/21 0828 01/16/21 0338 01/16/21 0639 01/16/21 2027 01/16/21 2041 01/16/21 2138 01/17/21 0410 01/17/21 0432 01/17/21 0735 01/17/21 1330  HGB 9.3*   < > 8.4*   < > 8.5*  --   --  8.8* 8.8*  --   --   HCT 28.6*   < > 25.9*   < > 25.0*  --   --  26.4* 26.0*  --   --   PLT 239  --  157  --   --   --   --  130*  --   --   --   LABPROT  --   --   --   --   --   --  14.7  --   --   --   --   INR  --   --   --   --   --   --  1.2  --   --   --   --   HEPARINUNFRC 0.13*   < > 0.23*   < >  --  0.13*  --   --   --  0.24* 0.26*  CREATININE 0.97   < > 0.83  --   --  0.74  --  0.76  --   --   --    < > = values in this interval not displayed.     Assessment: 60yo female on heparin after arrival from [facility] cath lab w/ IABP placement, awaiting possible CABG pending clinical improvement. Heparin level remains therapeutic at 0.26 units/mL; no bleeding reported.  Goal of Therapy:  Heparin level 0.2-0.5 units/ml Monitor platelets by anticoagulation protocol: Yes  Plan:  -Continue heparin 2400 units/hr -F/U AM labs  Jeramiah Mccaughey D. Mina Marble, PharmD, BCPS, [place] 01/17/2021, 4:11 PM

## 2021-01-17 NOTE — Progress Notes (Signed)
Day of Surgery Procedure(s) (LRB): CORONARY ARTERY BYPASS GRAFTING (CABG) (N/A) TRANSESOPHAGEAL ECHOCARDIOGRAM (TEE) (N/A) Subjective: Reintubated, now sedated  Objective: Vital signs in last 24 hours: Temp:  [96.5 F (35.8 C)-98.2 F (36.8 C)] 96.5 F (35.8 C) (12/01 0358) Pulse Rate:  [62-131] 63 (12/01 0700) Cardiac Rhythm: Normal sinus rhythm (12/01 0600) Resp:  [11-26] 16 (12/01 0700) BP: (108-201)/(50-154) 128/54 (12/01 0657) SpO2:  [92 %-100 %] 98 % (12/01 0700) FiO2 (%):  [40 %-100 %] 50 % (12/01 0438) Weight:  [89.6 kg] 89.6 kg (12/01 0530)  Hemodynamic parameters for last 24 hours: CVP:  [6 mmHg-11 mmHg] 6 mmHg  Intake/Output from previous day: 11/30 0701 - 12/01 0700 In: 1845.3 [I.V.:1332.9; NG/GT:100; IV Piggyback:412.4] Out: 5000 [Urine:5000] Intake/Output this shift: No intake/output data recorded.  General appearance: intubated Neurologic: sedated Heart: regular rate and rhythm Lungs: rhonchi bilaterally Abdomen: normal findings: mildly distended  Lab Results: Recent Labs    01/16/21 0338 01/16/21 0639 01/17/21 0410 01/17/21 0432  WBC 14.0*  --  12.1*  --   HGB 8.4*   < > 8.8* 8.8*  HCT 25.9*   < > 26.4* 26.0*  PLT 157  --  130*  --    < > = values in this interval not displayed.   BMET:  Recent Labs    01/16/21 2041 01/17/21 0410 01/17/21 0432  NA 136 137 139  K 3.4* 3.9 3.5  CL 97* 96*  --   CO2 29 31  --   GLUCOSE 187* 144*  --   BUN 26* 28*  --   CREATININE 0.74 0.76  --   CALCIUM 8.7* 9.4  --     PT/INR:  Recent Labs    01/16/21 2138  LABPROT 14.7  INR 1.2   ABG    Component Value Date/Time   PHART 7.482 (H) 01/17/2021 0432   HCO3 32.7 (H) 01/17/2021 0432   TCO2 34 (H) 01/17/2021 0432   ACIDBASEDEF 9.6 (H) 01/12/2021 2122   O2SAT 95.0 01/17/2021 0432   CBG (last 3)  Recent Labs    01/16/21 1958 01/16/21 2357 01/17/21 0356  GLUCAP 169* 157* 136*    Assessment/Plan: S/P Procedure(s) (LRB): CORONARY ARTERY  BYPASS GRAFTING (CABG) (N/A) TRANSESOPHAGEAL ECHOCARDIOGRAM (TEE) (N/A) - Reintubated last night for possible aspiration Initially on 40% then increased to 50% FiO2, 8 PEEP Co-ox is 70% She diuresed well and CXR actually looks a little better this AM Will cancel surgery this morning and reassess through the day, if respiratory status stabilizes and she doesn't have significant ALI will proceed with CABG tomorrow. If respiratory status worsens may not be a candidate for CABG. I was very frank with them that she may not survive either way   LOS: 4 days    [name] 01/17/2021

## 2021-01-17 NOTE — Progress Notes (Addendum)
Progress Note  Patient Name: Morgan Daniels Date of Encounter: 01/17/2021  [facility] Cardiologist: None , [place]- Dr. Saralyn Pilar  Subjective   Reintubated last night  Inpatient Medications    Scheduled Meds:  arformoterol  15 mcg Nebulization BID   aspirin  81 mg Per Tube Daily   atorvastatin  80 mg Per Tube Daily   chlorhexidine gluconate (MEDLINE KIT)  15 mL Mouth Rinse BID   Chlorhexidine Gluconate Cloth  6 each Topical Daily   docusate  100 mg Per Tube BID   furosemide  60 mg Intravenous Q8H   insulin aspart  0-20 Units Subcutaneous Q4H   insulin glargine-yfgn  25 Units Subcutaneous BID   ipratropium-albuterol  3 mL Nebulization Q6H   lidocaine  1 application Urethral Once   mouth rinse  15 mL Mouth Rinse 10 times per day   metoCLOPramide (REGLAN) injection  5 mg Intravenous Q6H   metoprolol tartrate  12.5 mg Per Tube BID   pantoprazole (PROTONIX) IV  40 mg Intravenous Daily   polyethylene glycol  17 g Per Tube BID   Racepinephrine HCl  0.5 mL Nebulization Once   revefenacin  175 mcg Nebulization Daily   senna  1 tablet Per Tube BID   sodium chloride flush  10-40 mL Intracatheter Q12H   Continuous Infusions:  dexmedetomidine (PRECEDEX) IV infusion 1 mcg/kg/hr (01/17/21 1006)   fentaNYL infusion INTRAVENOUS 300 mcg/hr (01/17/21 1058)   heparin 2,400 Units/hr (01/17/21 1000)   nitroGLYCERIN Stopped (01/17/21 0906)   piperacillin-tazobactam 3.375 g (01/17/21 1101)   piperacillin-tazobactam (ZOSYN)  IV     potassium chloride     promethazine (PHENERGAN) injection (IM or IVPB) Stopped (01/16/21 2233)   PRN Meds: acetaminophen (TYLENOL) oral liquid 160 mg/5 mL, albuterol, bisacodyl, labetalol, midazolam, midazolam, ondansetron (ZOFRAN) IV, phenol, promethazine (PHENERGAN) injection (IM or IVPB), sodium chloride flush, white petrolatum   Vital Signs    Vitals:   01/17/21 0900 01/17/21 1000 01/17/21 1100 01/17/21 1127  BP: (!) 119/50 (!) 122/53 (!) 97/54 (!) 98/51   Pulse: 68 69 64 64  Resp: _0 Temp:      TempSrc:      SpO2: 100% 100% 99% 99%  Weight:      Height:        Intake/Output Summary (Last 24 hours) at 01/17/2021 1127 Last data filed at 01/17/2021 1100 Gross per 24 hour  Intake 1842.07 ml  Output 5130 ml  Net -3287.93 ml   Last 3 Weights 01/17/2021 01/16/2021 01/15/2021  Weight (lbs) 197 lb 8.5 oz 198 lb 10.2 oz 196 lb 13.9 oz  Weight (kg) 89.6 kg 90.1 kg 89.3 kg      Telemetry    Normal sinus rhythm- Personally Reviewed  ECG      Physical Exam   GEN: No acute distress.  Intubated, sedated Neck: No JVD Cardiac: RRR, no murmurs, rubs, or gallops.  Respiratory: Coarse breath sounds to auscultation bilaterally. GI: Soft, nontender, non-distended  MS: No edema; No deformity.  2+ right dorsalis pedis pulse, no right groin hematoma, right groin dressing stable with minimal heme Neuro:  Nonfocal  Psych: Intubated, sedated  Labs    High Sensitivity Troponin:   Recent Labs  Lab 01/13/21 0143 01/13/21 0402 01/13/21 1256 01/13/21 1605 01/13/21 1827  TROPONINIHS 2,500* 2,772* 2,913* 2,587* 1,790*     Chemistry Recent Labs  Lab 01/12/21 2107 01/13/21 0402 01/15/21 7672 01/15/21 0947 01/16/21 0962 01/16/21 8366 01/16/21 2041 01/17/21 0410 01/17/21 2947  NA 135   < > 131*   < > 134*   < > 136 137 139  K 3.6   < > 4.4   < > 3.9   < > 3.4* 3.9 3.5  CL 99   < > 97*   < > 100  --  97* 96*  --   CO2 18*   < > 26   < > 27  --  29 31  --   GLUCOSE 446*   < > 214*   < > 191*  --  187* 144*  --   BUN 20   < > 26*   < > 26*  --  26* 28*  --   CREATININE 0.96   < > 0.97   < > 0.83  --  0.74 0.76  --   CALCIUM 9.0   < > 8.4*   < > 8.7*  --  8.7* 9.4  --   MG 2.3   < > 2.3  --  1.8  --   --  1.8  --   PROT 7.9  --   --   --   --   --   --   --   --   ALBUMIN 3.4*  --   --   --   --   --   --   --   --   AST 39  --   --   --   --   --   --   --   --   ALT 24  --   --   --   --   --   --   --   --   ALKPHOS 78  --    --   --   --   --   --   --   --   BILITOT 0.7  --   --   --   --   --   --   --   --   GFRNONAA >60   < > >60   < > >60  --  >60 >60  --   ANIONGAP 18*   < > 8   < > 7  --  10 10  --    < > = values in this interval not displayed.    Lipids  Recent Labs  Lab 01/16/21 0338  CHOL 138  TRIG 184*  HDL 30*  LDLCALC 71  CHOLHDL 4.6    Hematology Recent Labs  Lab 01/15/21 0412 01/15/21 0828 01/16/21 0338 01/16/21 0639 01/16/21 2027 01/17/21 0410 01/17/21 0432  WBC 15.3*  --  14.0*  --   --  12.1*  --   RBC 3.34*  --  3.10*  --   --  3.23*  --   HGB 9.3*   < > 8.4*   < > 8.5* 8.8* 8.8*  HCT 28.6*   < > 25.9*   < > 25.0* 26.4* 26.0*  MCV 85.6  --  83.5  --   --  81.7  --   MCH 27.8  --  27.1  --   --  27.2  --   MCHC 32.5  --  32.4  --   --  33.3  --   RDW 13.2  --  13.0  --   --  12.8  --   PLT 239  --  157  --   --  130*  --    < > =  values in this interval not displayed.   Thyroid No results for input(s): TSH, FREET4 in the last 168 hours.  BNP Recent Labs  Lab 01/13/21 0402 01/15/21 0412  BNP 527.5* 77.3    DDimer No results for input(s): DDIMER in the last 168 hours.   Radiology    DG Chest Port 1 View  Result Date: 01/17/2021 CLINICAL DATA:  Endotracheal tube, pulmonary edema. EXAM: PORTABLE CHEST 1 VIEW COMPARISON:  Chest radiograph dated January 16, 2021 FINDINGS: The heart is enlarged. Pulmonary vascular congestion has slightly improved from prior examination. Bibasilar atelectasis. Endotracheal tube and feeding tube coursing below the diaphragm with tip not included. IMPRESSION: 1. Stable cardiomegaly. Interval improvement in the pulmonary vascular congestion. No focal consolidation or large pleural effusion. 2.  Lines and tubes are unchanged. Electronically Signed   By: Keane Police D.O.   On: 01/17/2021 08:59   DG CHEST PORT 1 VIEW  Result Date: 01/16/2021 CLINICAL DATA:  Respiratory failure EXAM: PORTABLE CHEST 1 VIEW COMPARISON:  01/16/2021,  01/15/2021, 01/14/2021, CT 01/12/2021 FINDINGS: Endotracheal tube tip is about 2.6 cm superior to carina. Esophageal tube tip below the diaphragm but incompletely visualized. Cardiomegaly with vascular congestion and bilateral pulmonary edema. Similar bibasilar airspace disease. Right upper extremity central venous catheter tip over the SVC. IMPRESSION: 1. Support lines and tubes as above 2. Similar cardiomegaly with vascular congestion and pulmonary edema. Continued bibasilar atelectasis or pneumonia. Electronically Signed   By: Donavan Foil M.D.   On: 01/16/2021 20:48   DG Chest Port 1 View  Result Date: 01/16/2021 CLINICAL DATA:  Endotracheal tube present EXAM: PORTABLE CHEST 1 VIEW COMPARISON:  01/15/2021 FINDINGS: Endotracheal tube, enteric tube, and right PICC line are again identified. Similar shallow inspiration with low lung volumes. Patchy opacities bilaterally are without substantial change. No pleural effusion or pneumothorax. Similar cardiomediastinal contours. IMPRESSION: No significant change since 01/15/2021. Persistent bilateral opacities. Electronically Signed   By: Macy Mis M.D.   On: 01/16/2021 08:32   VAS US DOPPLER PRE CABG  Result Date: 01/15/2021 PREOPERATIVE VASCULAR EVALUATION Patient Name:  KADY TOOTHAKER  Date of Exam:   01/15/2021 Medical Rec #: [id number]     Accession #:    [id number] Date of Birth: 1960/12/24     Patient Gender: F Patient Age:   42 years Exam Location:  [facility] Procedure:      VAS US DOPPLER PRE CABG Referring Phys: Remo Lipps HENDRICKSON --------------------------------------------------------------------------------  Indications:      Pre-CABG. Risk Factors:     None. Limitations:      restricted right arm, restricted left arm, patient immobility,                   patient positioning, lines, bandages, Intra-aortic balloon                   pump in place Comparison Study: No prior studies. Performing Technologist: Carlos Levering RVT  Examination  Guidelines: A complete evaluation includes B-mode imaging, spectral Doppler, color Doppler, and power Doppler as needed of all accessible portions of each vessel. Bilateral testing is considered an integral part of a complete examination. Limited examinations for reoccurring indications may be performed as noted.  Right Carotid Findings: +----------+--------+--------+--------+-----------------------+--------+           PSV cm/sEDV cm/sStenosisDescribe               Comments +----------+--------+--------+--------+-----------------------+--------+ CCA Prox  smooth and heterogenous         +----------+--------+--------+--------+-----------------------+--------+ CCA Distal                        smooth and heterogenous         +----------+--------+--------+--------+-----------------------+--------+ ICA Prox                          smooth and heterogenous         +----------+--------+--------+--------+-----------------------+--------+  Left Carotid Findings: +----------+--------+--------+--------+-----------------------+--------+           PSV cm/sEDV cm/sStenosisDescribe               Comments +----------+--------+--------+--------+-----------------------+--------+ CCA Prox                          smooth and heterogenous         +----------+--------+--------+--------+-----------------------+--------+ CCA Distal                        smooth and heterogenous         +----------+--------+--------+--------+-----------------------+--------+ ICA Prox                          smooth and heterogenous         +----------+--------+--------+--------+-----------------------+--------+  ABI Findings: +--------+------------------+-----+--------+-------------------+ Right   Rt Pressure (mmHg)IndexWaveformComment             +--------+------------------+-----+--------+-------------------+ Brachial                               Restricted arm       +--------+------------------+-----+--------+-------------------+ PTA                                    Unable to visualize +--------+------------------+-----+--------+-------------------+ DP      94                0.65         Balloon pump        +--------+------------------+-----+--------+-------------------+ +--------+------------------+-----+--------+--------------+ Left    Lt Pressure (mmHg)IndexWaveformComment        +--------+------------------+-----+--------+--------------+ [id number]                            Balloon pump   +--------+------------------+-----+--------+--------------+ PTA                                    Restricted leg +--------+------------------+-----+--------+--------------+ DP                                     Restricted leg +--------+------------------+-----+--------+--------------+  Right Doppler Findings: +-----------+--------+-----+-------+--------------+ Site       PressureIndexDopplerComments       +-----------+--------+-----+-------+--------------+ Brachial                       Restricted arm +-----------+--------+-----+-------+--------------+ Radial                         Restricted arm +-----------+--------+-----+-------+--------------+ Ulnar  Restricted arm +-----------+--------+-----+-------+--------------+ Palmar Arch                    Restricted arm +-----------+--------+-----+-------+--------------+  Left Doppler Findings: +--------+--------+-----+-------+------------+ Site    PressureIndexDopplerComments     +--------+--------+-----+-------+------------+ [id number]                 Balloon pump +--------+--------+-----+-------+------------+ Radial                      Balloon pump +--------+--------+-----+-------+------------+ Ulnar                       Balloon pump +--------+--------+-----+-------+------------+  Summary: Right Carotid: Unable to  accurately measure waveforms due to the presence of a                balloon pump. Left Carotid: Unable to accurately measure waveforms due to the presence of a               balloon pump. Right ABI: Resting right ankle-brachial index indicates moderate right lower extremity arterial disease. Unable to assess waveforms due to the presence of a balloon pump. Left ABI: Unable to assess waveforms due to the presence of a balloon pump. Left Upper Extremity: Doppler waveform obliterate with left radial compression. Doppler waveforms decrease >50% with left ulnar compression.  Electronically signed by Harold Barban MD on 01/15/2021 at 7:03:16 PM.    Final     Cardiac Studies   EF showed recruitment of anterior and apical walls after balloon pump had been placed, with improved LVEF compared to cath.  Patient Profile     60 y.o. female severe CAD, pulmonary edema  Assessment & Plan    CAD/NSTEMI/cardiogenic shock: Severe, complex surgical disease.  IV heparin.  IABP in place.  High risk CABG planned with Dr. Roxan Hockey.  Surgery for today was canceled.  Hopeful for surgery tomorrow.  Aggressive diuresis the last few days to improve respiratory status.  Creatinine stable.  Continue atorvastatin for hyperlipidemia.     For questions or updates, please contact [place] Please consult [email or web address] for contact info under        Signed, Larae Grooms, MD  01/17/2021, 11:27 AM

## 2021-01-17 NOTE — Telephone Encounter (Signed)
Pt's sister called and advised that pt is in the [facility] [place] and informed us that pt is needing surgery.  Pt has 3 100% blockages in main arteries of heart and has been intubated.  She is suppose to have surgery on 01/18/21 but they still not sure bc of some issues.  Sister advised pt's husband has FMLA paperwork to be filled out and I told her to bring into office and give [place] information on when time started and how long he needs to be out of work.  Advised sister that husband may need an appt also.  She will bring paperwork by the office to complete.  I spoke to [facility] and she said we can do paperwork and she will look over their charts.

## 2021-01-18 ENCOUNTER — Inpatient Hospital Stay (HOSPITAL_COMMUNITY): Payer: Medicare Other

## 2021-01-18 ENCOUNTER — Inpatient Hospital Stay (HOSPITAL_COMMUNITY)
Admission: AD | Disposition: A | Discharge status: Rehabilitation Facility | Payer: Self-pay | Source: Other Acute Inpatient Hospital | Attending: Thoracic Surgery (Cardiothoracic Vascular Surgery)

## 2021-01-18 ENCOUNTER — Inpatient Hospital Stay (HOSPITAL_COMMUNITY): Payer: Medicare Other | Admitting: Certified Registered Nurse Anesthetist

## 2021-01-18 DIAGNOSIS — J690 Pneumonitis due to inhalation of food and vomit: Secondary | ICD-10-CM | POA: Diagnosis not present

## 2021-01-18 DIAGNOSIS — I214 Non-ST elevation (NSTEMI) myocardial infarction: Secondary | ICD-10-CM | POA: Diagnosis not present

## 2021-01-18 DIAGNOSIS — Z951 Presence of aortocoronary bypass graft: Secondary | ICD-10-CM

## 2021-01-18 DIAGNOSIS — R570 Cardiogenic shock: Secondary | ICD-10-CM | POA: Diagnosis not present

## 2021-01-18 DIAGNOSIS — I2510 Atherosclerotic heart disease of native coronary artery without angina pectoris: Secondary | ICD-10-CM

## 2021-01-18 DIAGNOSIS — J810 Acute pulmonary edema: Secondary | ICD-10-CM | POA: Diagnosis not present

## 2021-01-18 HISTORY — PX: ENDOVEIN HARVEST OF GREATER SAPHENOUS VEIN: SHX5059

## 2021-01-18 HISTORY — PX: CORONARY ARTERY BYPASS GRAFT: SHX141

## 2021-01-18 HISTORY — PX: TEE WITHOUT CARDIOVERSION: SHX5443

## 2021-01-18 LAB — POCT I-STAT 7, (LYTES, BLD GAS, ICA,H+H)
Acid-Base Excess: 7 mmol/L — ABNORMAL HIGH (ref 0.0–2.0)
Acid-Base Excess: 8 mmol/L — ABNORMAL HIGH (ref 0.0–2.0)
Acid-Base Excess: 5 mmol/L — ABNORMAL HIGH (ref 0.0–2.0)
Acid-Base Excess: 5 mmol/L — ABNORMAL HIGH (ref 0.0–2.0)
Acid-Base Excess: 7 mmol/L — ABNORMAL HIGH (ref 0.0–2.0)
Acid-Base Excess: 3 mmol/L — ABNORMAL HIGH (ref 0.0–2.0)
Bicarbonate: 32.8 mmol/L — ABNORMAL HIGH (ref 20.0–28.0)
Bicarbonate: 32.5 mmol/L — ABNORMAL HIGH (ref 20.0–28.0)
Bicarbonate: 28.7 mmol/L — ABNORMAL HIGH (ref 20.0–28.0)
Bicarbonate: 29.8 mmol/L — ABNORMAL HIGH (ref 20.0–28.0)
Bicarbonate: 31.1 mmol/L — ABNORMAL HIGH (ref 20.0–28.0)
Bicarbonate: 29.2 mmol/L — ABNORMAL HIGH (ref 20.0–28.0)
Calcium, Ion: 1.2 mmol/L (ref 1.15–1.40)
Calcium, Ion: 0.93 mmol/L — ABNORMAL LOW (ref 1.15–1.40)
Calcium, Ion: 1.05 mmol/L — ABNORMAL LOW (ref 1.15–1.40)
Calcium, Ion: 1.08 mmol/L — ABNORMAL LOW (ref 1.15–1.40)
Calcium, Ion: 1.09 mmol/L — ABNORMAL LOW (ref 1.15–1.40)
Calcium, Ion: 1.32 mmol/L (ref 1.15–1.40)
HCT: 26 % — ABNORMAL LOW (ref 36.0–46.0)
HCT: 22 % — ABNORMAL LOW (ref 36.0–46.0)
HCT: 22 % — ABNORMAL LOW (ref 36.0–46.0)
HCT: 22 % — ABNORMAL LOW (ref 36.0–46.0)
HCT: 23 % — ABNORMAL LOW (ref 36.0–46.0)
HCT: 24 % — ABNORMAL LOW (ref 36.0–46.0)
Hemoglobin: 8.8 g/dL — ABNORMAL LOW (ref 12.0–15.0)
Hemoglobin: 7.5 g/dL — ABNORMAL LOW (ref 12.0–15.0)
Hemoglobin: 7.5 g/dL — ABNORMAL LOW (ref 12.0–15.0)
Hemoglobin: 7.5 g/dL — ABNORMAL LOW (ref 12.0–15.0)
Hemoglobin: 7.8 g/dL — ABNORMAL LOW (ref 12.0–15.0)
Hemoglobin: 8.2 g/dL — ABNORMAL LOW (ref 12.0–15.0)
O2 Saturation: 97 %
O2 Saturation: 100 %
O2 Saturation: 100 %
O2 Saturation: 100 %
O2 Saturation: 100 %
O2 Saturation: 100 %
Potassium: 3.9 mmol/L (ref 3.5–5.1)
Potassium: 4.4 mmol/L (ref 3.5–5.1)
Potassium: 4.3 mmol/L (ref 3.5–5.1)
Potassium: 4.5 mmol/L (ref 3.5–5.1)
Potassium: 4.5 mmol/L (ref 3.5–5.1)
Potassium: 4.2 mmol/L (ref 3.5–5.1)
Sodium: 141 mmol/L (ref 135–145)
Sodium: 141 mmol/L (ref 135–145)
Sodium: 139 mmol/L (ref 135–145)
Sodium: 140 mmol/L (ref 135–145)
Sodium: 141 mmol/L (ref 135–145)
Sodium: 141 mmol/L (ref 135–145)
TCO2: 34 mmol/L — ABNORMAL HIGH (ref 22–32)
TCO2: 34 mmol/L — ABNORMAL HIGH (ref 22–32)
TCO2: 30 mmol/L (ref 22–32)
TCO2: 31 mmol/L (ref 22–32)
TCO2: 32 mmol/L (ref 22–32)
TCO2: 31 mmol/L (ref 22–32)
pCO2 arterial: 49.7 mmHg — ABNORMAL HIGH (ref 32.0–48.0)
pCO2 arterial: 45.4 mmHg (ref 32.0–48.0)
pCO2 arterial: 36 mmHg (ref 32.0–48.0)
pCO2 arterial: 41 mmHg (ref 32.0–48.0)
pCO2 arterial: 41.6 mmHg (ref 32.0–48.0)
pCO2 arterial: 51.2 mmHg — ABNORMAL HIGH (ref 32.0–48.0)
pH, Arterial: 7.427 (ref 7.350–7.450)
pH, Arterial: 7.463 — ABNORMAL HIGH (ref 7.350–7.450)
pH, Arterial: 7.463 — ABNORMAL HIGH (ref 7.350–7.450)
pH, Arterial: 7.488 — ABNORMAL HIGH (ref 7.350–7.450)
pH, Arterial: 7.509 — ABNORMAL HIGH (ref 7.350–7.450)
pH, Arterial: 7.364 (ref 7.350–7.450)
pO2, Arterial: 86 mmHg (ref 83.0–108.0)
pO2, Arterial: 497 mmHg — ABNORMAL HIGH (ref 83.0–108.0)
pO2, Arterial: 240 mmHg — ABNORMAL HIGH (ref 83.0–108.0)
pO2, Arterial: 299 mmHg — ABNORMAL HIGH (ref 83.0–108.0)
pO2, Arterial: 317 mmHg — ABNORMAL HIGH (ref 83.0–108.0)
pO2, Arterial: 204 mmHg — ABNORMAL HIGH (ref 83.0–108.0)

## 2021-01-18 LAB — POCT I-STAT EG7
Acid-Base Excess: 6 mmol/L — ABNORMAL HIGH (ref 0.0–2.0)
Bicarbonate: 31.2 mmol/L — ABNORMAL HIGH (ref 20.0–28.0)
Calcium, Ion: 1.03 mmol/L — ABNORMAL LOW (ref 1.15–1.40)
HCT: 23 % — ABNORMAL LOW (ref 36.0–46.0)
Hemoglobin: 7.8 g/dL — ABNORMAL LOW (ref 12.0–15.0)
O2 Saturation: 69 %
Potassium: 3.9 mmol/L (ref 3.5–5.1)
Sodium: 142 mmol/L (ref 135–145)
TCO2: 33 mmol/L — ABNORMAL HIGH (ref 22–32)
pCO2, Ven: 50.8 mmHg (ref 44.0–60.0)
pH, Ven: 7.397 (ref 7.250–7.430)
pO2, Ven: 37 mmHg (ref 32.0–45.0)

## 2021-01-18 LAB — BASIC METABOLIC PANEL
Anion gap: 9 (ref 5–15)
BUN: 32 mg/dL — ABNORMAL HIGH (ref 6–20)
CO2: 32 mmol/L (ref 22–32)
Calcium: 8.9 mg/dL (ref 8.9–10.3)
Chloride: 99 mmol/L (ref 98–111)
Creatinine, Ser: 1.02 mg/dL — ABNORMAL HIGH (ref 0.44–1.00)
GFR, Estimated: >60 mL/min (ref >60)
Glucose, Bld: 137 mg/dL — ABNORMAL HIGH (ref 70–99)
Potassium: 3.7 mmol/L (ref 3.5–5.1)
Sodium: 140 mmol/L (ref 135–145)

## 2021-01-18 LAB — POCT I-STAT, CHEM 8
BUN: 36 mg/dL — ABNORMAL HIGH (ref 6–20)
BUN: 35 mg/dL — ABNORMAL HIGH (ref 6–20)
BUN: 34 mg/dL — ABNORMAL HIGH (ref 6–20)
BUN: 34 mg/dL — ABNORMAL HIGH (ref 6–20)
BUN: 34 mg/dL — ABNORMAL HIGH (ref 6–20)
Calcium, Ion: 1.18 mmol/L (ref 1.15–1.40)
Calcium, Ion: 1.18 mmol/L (ref 1.15–1.40)
Calcium, Ion: 1.06 mmol/L — ABNORMAL LOW (ref 1.15–1.40)
Calcium, Ion: 1.11 mmol/L — ABNORMAL LOW (ref 1.15–1.40)
Calcium, Ion: 1.31 mmol/L (ref 1.15–1.40)
Chloride: 99 mmol/L (ref 98–111)
Chloride: 100 mmol/L (ref 98–111)
Chloride: 100 mmol/L (ref 98–111)
Chloride: 99 mmol/L (ref 98–111)
Chloride: 102 mmol/L (ref 98–111)
Creatinine, Ser: 1.1 mg/dL — ABNORMAL HIGH (ref 0.44–1.00)
Creatinine, Ser: 1 mg/dL (ref 0.44–1.00)
Creatinine, Ser: 1 mg/dL (ref 0.44–1.00)
Creatinine, Ser: 1.1 mg/dL — ABNORMAL HIGH (ref 0.44–1.00)
Creatinine, Ser: 1 mg/dL (ref 0.44–1.00)
Glucose, Bld: 179 mg/dL — ABNORMAL HIGH (ref 70–99)
Glucose, Bld: 172 mg/dL — ABNORMAL HIGH (ref 70–99)
Glucose, Bld: 137 mg/dL — ABNORMAL HIGH (ref 70–99)
Glucose, Bld: 154 mg/dL — ABNORMAL HIGH (ref 70–99)
Glucose, Bld: 138 mg/dL — ABNORMAL HIGH (ref 70–99)
HCT: 24 % — ABNORMAL LOW (ref 36.0–46.0)
HCT: 22 % — ABNORMAL LOW (ref 36.0–46.0)
HCT: 22 % — ABNORMAL LOW (ref 36.0–46.0)
HCT: 22 % — ABNORMAL LOW (ref 36.0–46.0)
HCT: 25 % — ABNORMAL LOW (ref 36.0–46.0)
Hemoglobin: 8.2 g/dL — ABNORMAL LOW (ref 12.0–15.0)
Hemoglobin: 7.5 g/dL — ABNORMAL LOW (ref 12.0–15.0)
Hemoglobin: 7.5 g/dL — ABNORMAL LOW (ref 12.0–15.0)
Hemoglobin: 7.5 g/dL — ABNORMAL LOW (ref 12.0–15.0)
Hemoglobin: 8.5 g/dL — ABNORMAL LOW (ref 12.0–15.0)
Potassium: 3.8 mmol/L (ref 3.5–5.1)
Potassium: 3.7 mmol/L (ref 3.5–5.1)
Potassium: 4.4 mmol/L (ref 3.5–5.1)
Potassium: 4.6 mmol/L (ref 3.5–5.1)
Potassium: 4.2 mmol/L (ref 3.5–5.1)
Sodium: 141 mmol/L (ref 135–145)
Sodium: 141 mmol/L (ref 135–145)
Sodium: 140 mmol/L (ref 135–145)
Sodium: 140 mmol/L (ref 135–145)
Sodium: 141 mmol/L (ref 135–145)
TCO2: 32 mmol/L (ref 22–32)
TCO2: 30 mmol/L (ref 22–32)
TCO2: 28 mmol/L (ref 22–32)
TCO2: 31 mmol/L (ref 22–32)
TCO2: 28 mmol/L (ref 22–32)

## 2021-01-18 LAB — PROTIME-INR
INR: 1.5 — ABNORMAL HIGH (ref 0.8–1.2)
Prothrombin Time: 18.1 seconds — ABNORMAL HIGH (ref 11.4–15.2)

## 2021-01-18 LAB — COOXEMETRY PANEL
Carboxyhemoglobin: 1.5 % (ref 0.5–1.5)
Methemoglobin: 1 % (ref 0.0–1.5)
O2 Saturation: 74.8 %
Total hemoglobin: 8 g/dL — ABNORMAL LOW (ref 12.0–16.0)

## 2021-01-18 LAB — CBC
HCT: 27.3 % — ABNORMAL LOW (ref 36.0–46.0)
HCT: 30.8 % — ABNORMAL LOW (ref 36.0–46.0)
Hemoglobin: 8.7 g/dL — ABNORMAL LOW (ref 12.0–15.0)
Hemoglobin: 10.2 g/dL — ABNORMAL LOW (ref 12.0–15.0)
MCH: 26.8 pg (ref 26.0–34.0)
MCH: 27.8 pg (ref 26.0–34.0)
MCHC: 31.9 g/dL (ref 30.0–36.0)
MCHC: 33.1 g/dL (ref 30.0–36.0)
MCV: 84 fL (ref 80.0–100.0)
MCV: 83.9 fL (ref 80.0–100.0)
Platelets: 120 10*3/uL — ABNORMAL LOW (ref 150–400)
Platelets: 72 10*3/uL — ABNORMAL LOW (ref 150–400)
RBC: 3.25 MIL/uL — ABNORMAL LOW (ref 3.87–5.11)
RBC: 3.67 MIL/uL — ABNORMAL LOW (ref 3.87–5.11)
RDW: 12.9 % (ref 11.5–15.5)
RDW: 13.2 % (ref 11.5–15.5)
WBC: 11.2 10*3/uL — ABNORMAL HIGH (ref 4.0–10.5)
WBC: 15.8 10*3/uL — ABNORMAL HIGH (ref 4.0–10.5)
nRBC: 0.2 % (ref 0.0–0.2)
nRBC: 0.2 % (ref 0.0–0.2)

## 2021-01-18 LAB — PHOSPHORUS: Phosphorus: 6.7 mg/dL — ABNORMAL HIGH (ref 2.5–4.6)

## 2021-01-18 LAB — GLUCOSE, CAPILLARY
Glucose-Capillary: 127 mg/dL — ABNORMAL HIGH (ref 70–99)
Glucose-Capillary: 126 mg/dL — ABNORMAL HIGH (ref 70–99)
Glucose-Capillary: 170 mg/dL — ABNORMAL HIGH (ref 70–99)
Glucose-Capillary: 125 mg/dL — ABNORMAL HIGH (ref 70–99)
Glucose-Capillary: 143 mg/dL — ABNORMAL HIGH (ref 70–99)
Glucose-Capillary: 145 mg/dL — ABNORMAL HIGH (ref 70–99)
Glucose-Capillary: 149 mg/dL — ABNORMAL HIGH (ref 70–99)
Glucose-Capillary: 148 mg/dL — ABNORMAL HIGH (ref 70–99)
Glucose-Capillary: 143 mg/dL — ABNORMAL HIGH (ref 70–99)

## 2021-01-18 LAB — SURGICAL PCR SCREEN
MRSA, PCR: NEGATIVE
Staphylococcus aureus: NEGATIVE

## 2021-01-18 LAB — PREPARE RBC (CROSSMATCH)

## 2021-01-18 LAB — HEMOGLOBIN AND HEMATOCRIT, BLOOD
HCT: 22.6 % — ABNORMAL LOW (ref 36.0–46.0)
Hemoglobin: 7.2 g/dL — ABNORMAL LOW (ref 12.0–15.0)

## 2021-01-18 LAB — HEPARIN LEVEL (UNFRACTIONATED): Heparin Unfractionated: 0.29 IU/mL — ABNORMAL LOW (ref 0.30–0.70)

## 2021-01-18 LAB — PLATELET COUNT: Platelets: 55 10*3/uL — ABNORMAL LOW (ref 150–400)

## 2021-01-18 LAB — APTT: aPTT: 42 seconds — ABNORMAL HIGH (ref 24–36)

## 2021-01-18 LAB — MAGNESIUM: Magnesium: 1.9 mg/dL (ref 1.7–2.4)

## 2021-01-18 SURGERY — CORONARY ARTERY BYPASS GRAFTING (CABG)
Anesthesia: General | Site: Chest

## 2021-01-18 MED ORDER — POTASSIUM CHLORIDE 10 MEQ/50ML IV SOLN: 10.0000 meq | INTRAVENOUS | Status: AC

## 2021-01-18 MED ORDER — ALBUMIN HUMAN 5 % IV SOLN
250.0000 mL | INTRAVENOUS | Status: AC | PRN
  Administered 2021-01-18 – 2021-01-19 (×2): 12.5 g via INTRAVENOUS

## 2021-01-18 MED ORDER — SODIUM CHLORIDE 0.9 % IV SOLN: INTRAVENOUS | Status: DC

## 2021-01-18 MED ORDER — SODIUM CHLORIDE 0.9% FLUSH
10.0000 mL | Freq: Two times a day (BID) | INTRAVENOUS | Status: DC
  Administered 2021-01-18 – 2021-01-22 (×5): 10 mL
  Administered 2021-01-22: 40 mL
  Administered 2021-01-23: 10 mL

## 2021-01-18 MED ORDER — FENTANYL CITRATE (PF) 250 MCG/5ML IJ SOLN
INTRAMUSCULAR | Status: DC | PRN
  Administered 2021-01-18: 150 ug via INTRAVENOUS
  Administered 2021-01-18: 100 ug via INTRAVENOUS
  Administered 2021-01-18: 250 ug via INTRAVENOUS
  Administered 2021-01-18: 100 ug via INTRAVENOUS
  Administered 2021-01-18 (×2): 150 ug via INTRAVENOUS
  Administered 2021-01-18: 250 ug via INTRAVENOUS
  Administered 2021-01-18: 100 ug via INTRAVENOUS

## 2021-01-18 MED ORDER — FENTANYL CITRATE PF 50 MCG/ML IJ SOSY
50.0000 ug | PREFILLED_SYRINGE | INTRAMUSCULAR | Status: DC | PRN
  Administered 2021-01-19: 50 ug via INTRAVENOUS
  Administered 2021-01-19 (×2): 100 ug via INTRAVENOUS
  Administered 2021-01-19 – 2021-01-20 (×6): 50 ug via INTRAVENOUS
  Administered 2021-01-21: 100 ug via INTRAVENOUS
  Administered 2021-01-21: 50 ug via INTRAVENOUS
  Administered 2021-01-21 (×2): 100 ug via INTRAVENOUS
  Filled 2021-01-18: qty 2
  Filled 2021-01-18 (×2): qty 1
  Filled 2021-01-18 (×7): qty 2

## 2021-01-18 MED ORDER — METOPROLOL TARTRATE 5 MG/5ML IV SOLN
2.5000 mg | INTRAVENOUS | Status: DC | PRN
  Administered 2021-01-21: 2.5 mg via INTRAVENOUS
  Filled 2021-01-18: qty 5

## 2021-01-18 MED ORDER — ASPIRIN EC 325 MG PO TBEC
325.0000 mg | DELAYED_RELEASE_TABLET | Freq: Every day | ORAL | Status: DC
  Administered 2021-01-21: 325 mg via ORAL
  Filled 2021-01-18 (×2): qty 1

## 2021-01-18 MED ORDER — FENTANYL CITRATE (PF) 250 MCG/5ML IJ SOLN
INTRAMUSCULAR | Status: AC
  Filled 2021-01-18: qty 5

## 2021-01-18 MED ORDER — SODIUM CHLORIDE 0.45 % IV SOLN: INTRAVENOUS | Status: DC | PRN

## 2021-01-18 MED ORDER — SODIUM CHLORIDE (PF) 0.9 % IJ SOLN
OROMUCOSAL | Status: DC | PRN
  Administered 2021-01-18: 4 mL via TOPICAL

## 2021-01-18 MED ORDER — HEMOSTATIC AGENTS (NO CHARGE) OPTIME
TOPICAL | Status: DC | PRN
  Administered 2021-01-18: 1 via TOPICAL

## 2021-01-18 MED ORDER — SODIUM CHLORIDE 0.9% FLUSH: 3.0000 mL | INTRAVENOUS | Status: DC | PRN

## 2021-01-18 MED ORDER — FAMOTIDINE IN NACL 20-0.9 MG/50ML-% IV SOLN
20.0000 mg | Freq: Two times a day (BID) | INTRAVENOUS | Status: AC
  Administered 2021-01-18 – 2021-01-19 (×2): 20 mg via INTRAVENOUS
  Filled 2021-01-18 (×2): qty 50

## 2021-01-18 MED ORDER — MAGNESIUM SULFATE 2 GM/50ML IV SOLN
2.0000 g | Freq: Once | INTRAVENOUS | Status: AC
  Administered 2021-01-18: 2 g via INTRAVENOUS
  Filled 2021-01-18: qty 50

## 2021-01-18 MED ORDER — CHLORHEXIDINE GLUCONATE CLOTH 2 % EX PADS
6.0000 | MEDICATED_PAD | Freq: Every day | CUTANEOUS | Status: DC
  Administered 2021-01-18 – 2021-01-25 (×6): 6 via TOPICAL

## 2021-01-18 MED ORDER — SODIUM CHLORIDE 0.9 % IV SOLN: INTRAVENOUS | Status: DC | PRN

## 2021-01-18 MED ORDER — NITROGLYCERIN IN D5W 200-5 MCG/ML-% IV SOLN: 0.0000 ug/min | INTRAVENOUS | Status: DC

## 2021-01-18 MED ORDER — MILRINONE LACTATE IN DEXTROSE 20-5 MG/100ML-% IV SOLN: 0.3000 ug/kg/min | INTRAVENOUS | Status: DC

## 2021-01-18 MED ORDER — SODIUM CHLORIDE 0.9 % IV SOLN: 250.0000 mL | INTRAVENOUS | Status: DC

## 2021-01-18 MED ORDER — LACTATED RINGERS IV SOLN
INTRAVENOUS | Status: DC | PRN
Start: 2021-01-18 — End: 2021-01-18

## 2021-01-18 MED ORDER — PROPOFOL 10 MG/ML IV BOLUS
INTRAVENOUS | Status: DC | PRN
  Administered 2021-01-18: 20 mg via INTRAVENOUS
  Administered 2021-01-18 (×2): 30 mg via INTRAVENOUS

## 2021-01-18 MED ORDER — PROTAMINE SULFATE 10 MG/ML IV SOLN
INTRAVENOUS | Status: DC | PRN
  Administered 2021-01-18: 260 mg via INTRAVENOUS
  Administered 2021-01-18: 30 mg via INTRAVENOUS

## 2021-01-18 MED ORDER — SODIUM CHLORIDE 0.9% FLUSH
3.0000 mL | Freq: Two times a day (BID) | INTRAVENOUS | Status: DC
  Administered 2021-01-19 – 2021-01-23 (×6): 3 mL via INTRAVENOUS

## 2021-01-18 MED ORDER — AMIODARONE HCL IN DEXTROSE 360-4.14 MG/200ML-% IV SOLN
INTRAVENOUS | Status: DC | PRN
  Administered 2021-01-18: 60 mg/h via INTRAVENOUS

## 2021-01-18 MED ORDER — POTASSIUM CHLORIDE 20 MEQ PO PACK: 40.0000 meq | PACK | Freq: Once | ORAL | Status: DC

## 2021-01-18 MED ORDER — MIDAZOLAM HCL (PF) 5 MG/ML IJ SOLN
INTRAMUSCULAR | Status: DC | PRN
  Administered 2021-01-18: 3 mg via INTRAVENOUS
  Administered 2021-01-18: 1 mg via INTRAVENOUS
  Administered 2021-01-18: 2 mg via INTRAVENOUS

## 2021-01-18 MED ORDER — ROCURONIUM BROMIDE 10 MG/ML (PF) SYRINGE
PREFILLED_SYRINGE | INTRAVENOUS | Status: DC | PRN
  Administered 2021-01-18: 60 mg via INTRAVENOUS
  Administered 2021-01-18: 40 mg via INTRAVENOUS
  Administered 2021-01-18: 60 mg via INTRAVENOUS
  Administered 2021-01-18: 40 mg via INTRAVENOUS

## 2021-01-18 MED ORDER — ACETAMINOPHEN 160 MG/5ML PO SOLN
1000.0000 mg | Freq: Four times a day (QID) | ORAL | Status: AC
  Administered 2021-01-18 – 2021-01-23 (×15): 1000 mg
  Filled 2021-01-18 (×15): qty 40.6

## 2021-01-18 MED ORDER — DEXTROSE 50 % IV SOLN: 0.0000 mL | INTRAVENOUS | Status: DC | PRN

## 2021-01-18 MED ORDER — BISACODYL 10 MG RE SUPP
10.0000 mg | Freq: Every day | RECTAL | Status: DC
  Administered 2021-01-19 – 2021-01-20 (×2): 10 mg via RECTAL
  Filled 2021-01-18 (×2): qty 1

## 2021-01-18 MED ORDER — PHENYLEPHRINE HCL-NACL 20-0.9 MG/250ML-% IV SOLN: 0.0000 ug/min | INTRAVENOUS | Status: DC

## 2021-01-18 MED ORDER — INSULIN REGULAR(HUMAN) IN NACL 100-0.9 UT/100ML-% IV SOLN: INTRAVENOUS | Status: DC

## 2021-01-18 MED ORDER — ONDANSETRON HCL 4 MG/2ML IJ SOLN
4.0000 mg | Freq: Four times a day (QID) | INTRAMUSCULAR | Status: DC | PRN
  Administered 2021-01-20 – 2021-01-24 (×5): 4 mg via INTRAVENOUS
  Filled 2021-01-18 (×5): qty 2

## 2021-01-18 MED ORDER — METOPROLOL TARTRATE 25 MG/10 ML ORAL SUSPENSION
12.5000 mg | Freq: Two times a day (BID) | ORAL | Status: DC
  Administered 2021-01-20: 12.5 mg
  Filled 2021-01-18: qty 5

## 2021-01-18 MED ORDER — GUAIFENESIN 100 MG/5ML PO LIQD: 5.0000 mL | ORAL | Status: DC | PRN

## 2021-01-18 MED ORDER — OXYCODONE HCL 5 MG PO TABS: 5.0000 mg | ORAL_TABLET | ORAL | Status: DC | PRN

## 2021-01-18 MED ORDER — MAGNESIUM SULFATE 2 GM/50ML IV SOLN: 2.0000 g | Freq: Once | INTRAVENOUS | Status: DC

## 2021-01-18 MED ORDER — LACTATED RINGERS IV SOLN: INTRAVENOUS | Status: DC

## 2021-01-18 MED ORDER — DOCUSATE SODIUM 100 MG PO CAPS: 200.0000 mg | ORAL_CAPSULE | Freq: Every day | ORAL | Status: DC

## 2021-01-18 MED ORDER — AMIODARONE HCL IN DEXTROSE 360-4.14 MG/200ML-% IV SOLN
30.0000 mg/h | INTRAVENOUS | Status: DC
  Administered 2021-01-18 – 2021-01-19 (×3): 30 mg/h via INTRAVENOUS
  Filled 2021-01-18 (×4): qty 200

## 2021-01-18 MED ORDER — DEXMEDETOMIDINE HCL IN NACL 400 MCG/100ML IV SOLN
0.0000 ug/kg/h | INTRAVENOUS | Status: DC
  Administered 2021-01-18 – 2021-01-19 (×3): 0.7 ug/kg/h via INTRAVENOUS
  Administered 2021-01-19: 0.5 ug/kg/h via INTRAVENOUS
  Administered 2021-01-20 (×2): 0.7 ug/kg/h via INTRAVENOUS
  Filled 2021-01-18 (×4): qty 100
  Filled 2021-01-18: qty 200
  Filled 2021-01-18: qty 100

## 2021-01-18 MED ORDER — MIDAZOLAM HCL 2 MG/2ML IJ SOLN
2.0000 mg | INTRAMUSCULAR | Status: DC | PRN
  Administered 2021-01-19 – 2021-01-20 (×4): 2 mg via INTRAVENOUS
  Filled 2021-01-18 (×6): qty 2

## 2021-01-18 MED ORDER — LACTATED RINGERS IV SOLN: INTRAVENOUS | Status: DC | PRN

## 2021-01-18 MED ORDER — HEPARIN SODIUM (PORCINE) 1000 UNIT/ML IJ SOLN
INTRAMUSCULAR | Status: DC | PRN
  Administered 2021-01-18: 2000 [IU] via INTRAVENOUS
  Administered 2021-01-18: 27000 [IU] via INTRAVENOUS

## 2021-01-18 MED ORDER — CHLORHEXIDINE GLUCONATE 0.12 % MT SOLN
15.0000 mL | OROMUCOSAL | Status: AC
  Administered 2021-01-18: 15 mL via OROMUCOSAL

## 2021-01-18 MED ORDER — MIDAZOLAM HCL (PF) 10 MG/2ML IJ SOLN
INTRAMUSCULAR | Status: AC
  Filled 2021-01-18: qty 2

## 2021-01-18 MED ORDER — PANTOPRAZOLE SODIUM 40 MG PO TBEC: 40.0000 mg | DELAYED_RELEASE_TABLET | Freq: Every day | ORAL | Status: DC

## 2021-01-18 MED ORDER — POTASSIUM CHLORIDE 20 MEQ PO PACK
40.0000 meq | PACK | Freq: Once | ORAL | Status: AC
  Administered 2021-01-18: 40 meq via ORAL
  Filled 2021-01-18: qty 2

## 2021-01-18 MED ORDER — MAGNESIUM SULFATE 4 GM/100ML IV SOLN
4.0000 g | Freq: Once | INTRAVENOUS | Status: AC
  Administered 2021-01-18: 4 g via INTRAVENOUS
  Filled 2021-01-18: qty 100

## 2021-01-18 MED ORDER — PHENYLEPHRINE 40 MCG/ML (10ML) SYRINGE FOR IV PUSH (FOR BLOOD PRESSURE SUPPORT)
PREFILLED_SYRINGE | INTRAVENOUS | Status: AC
  Filled 2021-01-18: qty 10

## 2021-01-18 MED ORDER — CHLORHEXIDINE GLUCONATE 0.12% ORAL RINSE (MEDLINE KIT)
15.0000 mL | Freq: Two times a day (BID) | OROMUCOSAL | Status: DC
  Administered 2021-01-18 – 2021-01-23 (×10): 15 mL via OROMUCOSAL

## 2021-01-18 MED ORDER — SODIUM CHLORIDE 0.9% FLUSH: 10.0000 mL | INTRAVENOUS | Status: DC | PRN

## 2021-01-18 MED ORDER — AMIODARONE HCL IN DEXTROSE 360-4.14 MG/200ML-% IV SOLN: 60.0000 mg/h | INTRAVENOUS | Status: AC

## 2021-01-18 MED ORDER — ASPIRIN 81 MG PO CHEW
324.0000 mg | CHEWABLE_TABLET | Freq: Every day | ORAL | Status: DC
  Administered 2021-01-19 – 2021-01-20 (×2): 324 mg
  Filled 2021-01-18 (×2): qty 4

## 2021-01-18 MED ORDER — ROCURONIUM BROMIDE 10 MG/ML (PF) SYRINGE
PREFILLED_SYRINGE | INTRAVENOUS | Status: AC
  Filled 2021-01-18: qty 10

## 2021-01-18 MED ORDER — HEPARIN SODIUM (PORCINE) 1000 UNIT/ML IJ SOLN
INTRAMUSCULAR | Status: AC
  Filled 2021-01-18: qty 1

## 2021-01-18 MED ORDER — ACETAMINOPHEN 650 MG RE SUPP
650.0000 mg | Freq: Once | RECTAL | Status: AC
  Administered 2021-01-18: 650 mg via RECTAL

## 2021-01-18 MED ORDER — ACETAMINOPHEN 160 MG/5ML PO SOLN: 650.0000 mg | Freq: Once | ORAL | Status: AC

## 2021-01-18 MED ORDER — 0.9 % SODIUM CHLORIDE (POUR BTL) OPTIME
TOPICAL | Status: DC | PRN
  Administered 2021-01-18: 5000 mL

## 2021-01-18 MED ORDER — ACETAMINOPHEN 500 MG PO TABS: 1000.0000 mg | ORAL_TABLET | Freq: Four times a day (QID) | ORAL | Status: AC

## 2021-01-18 MED ORDER — TRAMADOL HCL 50 MG PO TABS
50.0000 mg | ORAL_TABLET | ORAL | Status: DC | PRN
  Filled 2021-01-18: qty 1

## 2021-01-18 MED ORDER — BISACODYL 5 MG PO TBEC: 10.0000 mg | DELAYED_RELEASE_TABLET | Freq: Every day | ORAL | Status: DC

## 2021-01-18 MED ORDER — PROPOFOL 10 MG/ML IV BOLUS
INTRAVENOUS | Status: AC
  Filled 2021-01-18: qty 20

## 2021-01-18 MED ORDER — METOPROLOL TARTRATE 12.5 MG HALF TABLET: 12.5000 mg | ORAL_TABLET | Freq: Two times a day (BID) | ORAL | Status: DC

## 2021-01-18 MED ORDER — NITROGLYCERIN 0.2 MG/ML ON CALL CATH LAB
INTRAVENOUS | Status: DC | PRN
  Administered 2021-01-18: 40 ug via INTRAVENOUS

## 2021-01-18 MED ORDER — VANCOMYCIN HCL IN DEXTROSE 1-5 GM/200ML-% IV SOLN
1000.0000 mg | Freq: Once | INTRAVENOUS | Status: AC
  Administered 2021-01-18: 1000 mg via INTRAVENOUS
  Filled 2021-01-18: qty 200

## 2021-01-18 MED ORDER — ORAL CARE MOUTH RINSE
15.0000 mL | OROMUCOSAL | Status: DC
  Administered 2021-01-19 – 2021-01-23 (×37): 15 mL via OROMUCOSAL

## 2021-01-18 MED ORDER — AMIODARONE HCL IN DEXTROSE 360-4.14 MG/200ML-% IV SOLN: 30.0000 mg/h | INTRAVENOUS | Status: DC

## 2021-01-18 MED ORDER — LACTATED RINGERS IV SOLN: 500.0000 mL | Freq: Once | INTRAVENOUS | Status: DC | PRN

## 2021-01-18 SURGICAL SUPPLY — 94 items
ADAPTER CARDIO PERF ANTE/RETRO (ADAPTER) ×1 IMPLANT
ADH SKN CLS APL DERMABOND .7 (GAUZE/BANDAGES/DRESSINGS) ×4
ADPR PRFSN 84XANTGRD RTRGD (ADAPTER) ×4
BAG DECANTER FOR FLEXI CONT (MISCELLANEOUS) ×5 IMPLANT
BLADE CLIPPER SURG (BLADE) ×5 IMPLANT
BLADE STERNUM SYSTEM 6 (BLADE) ×5 IMPLANT
BNDG ELASTIC 4X5.8 VLCR STR LF (GAUZE/BANDAGES/DRESSINGS) ×5 IMPLANT
BNDG ELASTIC 6X5.8 VLCR STR LF (GAUZE/BANDAGES/DRESSINGS) ×5 IMPLANT
BNDG GAUZE ELAST 4 BULKY (GAUZE/BANDAGES/DRESSINGS) ×5 IMPLANT
CANISTER SUCT 3000ML PPV (MISCELLANEOUS) ×5 IMPLANT
CANNULA EZ GLIDE AORTIC 21FR (CANNULA) ×5 IMPLANT
CANNULA GUNDRY RCSP 15FR (MISCELLANEOUS) ×1 IMPLANT
CATH CPB KIT HENDRICKSON (MISCELLANEOUS) ×5 IMPLANT
CATH ROBINSON RED A/P 18FR (CATHETERS) ×5 IMPLANT
CATH THORACIC 36FR (CATHETERS) ×5 IMPLANT
CATH THORACIC 36FR RT ANG (CATHETERS) ×5 IMPLANT
CLIP FOGARTY SPRING 6M (CLIP) ×2 IMPLANT
CLIP TI WIDE RED SMALL 24 (CLIP) ×1 IMPLANT
CLIP VESOCCLUDE MED 24/CT (CLIP) IMPLANT
CLIP VESOCCLUDE SM WIDE 24/CT (CLIP) IMPLANT
CONTAINER PROTECT SURGISLUSH (MISCELLANEOUS) ×10 IMPLANT
DERMABOND ADVANCED (GAUZE/BANDAGES/DRESSINGS) ×1
DERMABOND ADVANCED .7 DNX12 (GAUZE/BANDAGES/DRESSINGS) IMPLANT
DRAPE CARDIOVASCULAR INCISE (DRAPES) ×5
DRAPE SRG 135X102X78XABS (DRAPES) ×4 IMPLANT
DRAPE WARM FLUID 44X44 (DRAPES) ×5 IMPLANT
DRSG COVADERM 4X14 (GAUZE/BANDAGES/DRESSINGS) ×5 IMPLANT
ELECT REM PT RETURN 9FT ADLT (ELECTROSURGICAL) ×10
ELECTRODE REM PT RTRN 9FT ADLT (ELECTROSURGICAL) ×8 IMPLANT
FELT TEFLON 1X6 (MISCELLANEOUS) ×9 IMPLANT
GAUZE 4X4 16PLY ~~LOC~~+RFID DBL (SPONGE) ×1 IMPLANT
GAUZE SPONGE 4X4 12PLY STRL (GAUZE/BANDAGES/DRESSINGS) ×10 IMPLANT
GAUZE SPONGE 4X4 12PLY STRL LF (GAUZE/BANDAGES/DRESSINGS) ×2 IMPLANT
GLOVE SURG SIGNA 7.5 PF LTX (GLOVE) ×15 IMPLANT
GLOVE SURG UNDER POLY LF SZ6 (GLOVE) ×1 IMPLANT
GOWN STRL REUS W/ TWL LRG LVL3 (GOWN DISPOSABLE) ×16 IMPLANT
GOWN STRL REUS W/ TWL XL LVL3 (GOWN DISPOSABLE) ×8 IMPLANT
GOWN STRL REUS W/TWL LRG LVL3 (GOWN DISPOSABLE) ×20
GOWN STRL REUS W/TWL XL LVL3 (GOWN DISPOSABLE) ×10
HEMOSTAT POWDER SURGIFOAM 1G (HEMOSTASIS) ×15 IMPLANT
HEMOSTAT SURGICEL 2X14 (HEMOSTASIS) ×5 IMPLANT
INSERT FOGARTY XLG (MISCELLANEOUS) IMPLANT
KIT BASIN OR (CUSTOM PROCEDURE TRAY) ×5 IMPLANT
KIT SUCTION CATH 14FR (SUCTIONS) ×10 IMPLANT
KIT TURNOVER KIT B (KITS) ×5 IMPLANT
KIT VASOVIEW HEMOPRO 2 VH 4000 (KITS) ×5 IMPLANT
MARKER GRAFT CORONARY BYPASS (MISCELLANEOUS) ×16 IMPLANT
NS IRRIG 1000ML POUR BTL (IV SOLUTION) ×25 IMPLANT
PACK E OPEN HEART (SUTURE) ×5 IMPLANT
PACK OPEN HEART (CUSTOM PROCEDURE TRAY) ×5 IMPLANT
PAD ARMBOARD 7.5X6 YLW CONV (MISCELLANEOUS) ×10 IMPLANT
PAD ELECT DEFIB RADIOL ZOLL (MISCELLANEOUS) ×5 IMPLANT
PENCIL BUTTON HOLSTER BLD 10FT (ELECTRODE) ×5 IMPLANT
POSITIONER HEAD DONUT 9IN (MISCELLANEOUS) ×5 IMPLANT
PUNCH AORTIC ROTATE 4.0MM (MISCELLANEOUS) IMPLANT
PUNCH AORTIC ROTATE 4.5MM 8IN (MISCELLANEOUS) ×1 IMPLANT
PUNCH AORTIC ROTATE 5MM 8IN (MISCELLANEOUS) IMPLANT
SET MPS 3-ND DEL (MISCELLANEOUS) ×1 IMPLANT
SOL PREP POV-IOD 4OZ 10% (MISCELLANEOUS) ×2 IMPLANT
SOL PREP PROV IODINE SCRUB 4OZ (MISCELLANEOUS) ×2 IMPLANT
SPONGE T-LAP 18X18 ~~LOC~~+RFID (SPONGE) ×4 IMPLANT
SPONGE T-LAP 4X18 ~~LOC~~+RFID (SPONGE) ×1 IMPLANT
SUPPORT HEART JANKE-BARRON (MISCELLANEOUS) ×5 IMPLANT
SUT BONE WAX W31G (SUTURE) ×5 IMPLANT
SUT MNCRL AB 4-0 PS2 18 (SUTURE) ×1 IMPLANT
SUT PROLENE 3 0 SH DA (SUTURE) ×5 IMPLANT
SUT PROLENE 4 0 RB 1 (SUTURE) ×40
SUT PROLENE 4 0 SH DA (SUTURE) ×2 IMPLANT
SUT PROLENE 4-0 RB1 .5 CRCL 36 (SUTURE) IMPLANT
SUT PROLENE 6 0 C 1 30 (SUTURE) ×10 IMPLANT
SUT PROLENE 7 0 BV 1 (SUTURE) ×3 IMPLANT
SUT PROLENE 7 0 BV1 MDA (SUTURE) ×5 IMPLANT
SUT PROLENE 8 0 BV175 6 (SUTURE) ×2 IMPLANT
SUT STEEL 6MS V (SUTURE) ×5 IMPLANT
SUT STEEL STERNAL CCS#1 18IN (SUTURE) IMPLANT
SUT STEEL SZ 6 DBL 3X14 BALL (SUTURE) ×5 IMPLANT
SUT VIC AB 1 CTX 36 (SUTURE) ×10
SUT VIC AB 1 CTX36XBRD ANBCTR (SUTURE) ×8 IMPLANT
SUT VIC AB 2-0 CT1 27 (SUTURE) ×5
SUT VIC AB 2-0 CT1 TAPERPNT 27 (SUTURE) IMPLANT
SUT VIC AB 2-0 CTX 27 (SUTURE) IMPLANT
SUT VIC AB 3-0 SH 27 (SUTURE)
SUT VIC AB 3-0 SH 27X BRD (SUTURE) IMPLANT
SUT VIC AB 3-0 X1 27 (SUTURE) IMPLANT
SUT VICRYL 4-0 PS2 18IN ABS (SUTURE) IMPLANT
SYSTEM SAHARA CHEST DRAIN ATS (WOUND CARE) ×5 IMPLANT
TAPE CLOTH SURG 4X10 WHT LF (GAUZE/BANDAGES/DRESSINGS) ×2 IMPLANT
TAPE PAPER 2X10 WHT MICROPORE (GAUZE/BANDAGES/DRESSINGS) ×1 IMPLANT
TOWEL GREEN STERILE (TOWEL DISPOSABLE) ×5 IMPLANT
TOWEL GREEN STERILE FF (TOWEL DISPOSABLE) ×5 IMPLANT
TRAY FOLEY SLVR 16FR TEMP STAT (SET/KITS/TRAYS/PACK) ×5 IMPLANT
TUBING LAP HI FLOW INSUFFLATIO (TUBING) ×5 IMPLANT
UNDERPAD 30X36 HEAVY ABSORB (UNDERPADS AND DIAPERS) ×5 IMPLANT
WATER STERILE IRR 1000ML POUR (IV SOLUTION) ×10 IMPLANT

## 2021-01-18 NOTE — Interval H&P Note (Signed)
History and Physical Interval Note:  01/18/2021 11:15 AM  Morgan Daniels  has presented today for surgery, with the diagnosis of CAD.  The various methods of treatment have been discussed with the patient and family. After consideration of risks, benefits and other options for treatment, the patient has consented to  Procedure(s): CORONARY ARTERY BYPASS GRAFTING (CABG) (N/A) TRANSESOPHAGEAL ECHOCARDIOGRAM (TEE) (N/A) as a surgical intervention.  The patient's history has been reviewed, patient examined, no change in status, stable for surgery.  I have reviewed the patient's chart and labs.  Questions were answered to the patient's satisfaction.     [name]

## 2021-01-18 NOTE — Progress Notes (Signed)
EVENING ROUNDS NOTE :     [address]       [place] [address]             [phone]                 Day of Surgery Procedure(s) (LRB): CORONARY ARTERY BYPASS GRAFTING (CABG) x 4  USING LEFT INTERNAL MAMMARY ARTERY AND LEFT ENDOSCOPIC GREATER SAPHENOUS VEIN CONDUITS (N/A) TRANSESOPHAGEAL ECHOCARDIOGRAM (TEE) (N/A) APPLICATION OF CELL SAVER ENDOVEIN HARVEST OF GREATER SAPHENOUS VEIN (Left)   Total Length of Stay:  LOS: 5 days  Events:   HD stable Minimal CT output Will keep intubated tonight    BP (!) 104/58   Pulse 86   Temp 98.9 F (37.2 C) (Oral)   Resp 16   Ht 5\' 6"  (1.676 m)   Wt 87.5 kg   LMP  (LMP Unknown) Comment: pt intubated  SpO2 96%   BMI 31.14 kg/m   CVP:  [4 mmHg-7 mmHg] 5 mmHg  Vent Mode: PRVC FiO2 (%):  [40 %] 40 % Set Rate:  [16 bmp] 16 bmp Vt Set:  [470 mL] 470 mL PEEP:  [8 cmH20] 8 cmH20 Plateau Pressure:  [17 cmH20-22 cmH20] 19 cmH20   sodium chloride     [START ON 01/19/2021] sodium chloride     sodium chloride     albumin human     amiodarone 60 mg/hr (01/18/21 1733)   dexmedetomidine (PRECEDEX) IV infusion 0.7 mcg/kg/hr (01/18/21 1730)   famotidine (PEPCID) IV     insulin 3.4 Units/hr (01/18/21 1715)   lactated ringers     lactated ringers     lactated ringers 20 mL/hr at 01/18/21 1733   magnesium sulfate     milrinone     nitroGLYCERIN     phenylephrine (NEO-SYNEPHRINE) Adult infusion 5 mcg/min (01/18/21 1732)   piperacillin-tazobactam (ZOSYN)  IV 12.5 mL/hr at 01/18/21 1136   potassium chloride     vancomycin      I/O last 3 completed shifts: In: 3457.3 [I.V.:2560.5; NG/GT:110; IV Piggyback:786.8] Out: 5638 [VFIEP:3295; Emesis/NG output:450]   CBC Latest Ref Rng & Units 01/18/2021 01/18/2021 01/18/2021  WBC 4.0 - 10.5 K/uL - - -  Hemoglobin 12.0 - 15.0 g/dL 8.5(L) 8.2(L) 7.5(L)  Hematocrit 36.0 - 46.0 % 25.0(L) 24.0(L) 22.0(L)  Platelets 150 - 400 K/uL - - -    BMP Latest Ref Rng & Units 01/18/2021  01/18/2021 01/18/2021  Glucose 70 - 99 mg/dL 138(H) - 137(H)  BUN 6 - 20 mg/dL 34(H) - 34(H)  Creatinine 0.44 - 1.00 mg/dL 1.00 - 1.10(H)  Sodium 135 - 145 mmol/L 141 141 140  Potassium 3.5 - 5.1 mmol/L 4.2 4.2 4.6  Chloride 98 - 111 mmol/L 102 - 100  CO2 22 - 32 mmol/L - - -  Calcium 8.9 - 10.3 mg/dL - - -    ABG    Component Value Date/Time   PHART 7.364 01/18/2021 1601   PCO2ART 51.2 (H) 01/18/2021 1601   PO2ART 204 (H) 01/18/2021 1601   HCO3 29.2 (H) 01/18/2021 1601   TCO2 28 01/18/2021 1604   ACIDBASEDEF 9.6 (H) 01/12/2021 2122   O2SAT 100.0 01/18/2021 1601       Melodie Bouillon, MD 01/18/2021 5:54 PM

## 2021-01-18 NOTE — Progress Notes (Signed)
Echocardiogram Echocardiogram Transesophageal has been performed.  Oneal Deputy Naveya Ellerman RDCS 01/18/2021, 1:09 PM

## 2021-01-18 NOTE — Progress Notes (Signed)
[place] for heparin Indication:  IABP  Heparin dosing weight = 79 kg  Labs: Recent Labs    01/16/21 2138 01/17/21 0410 01/17/21 0432 01/17/21 0735 01/17/21 1330 01/17/21 1712 01/18/21 0408  HGB  --  8.8* 8.8*  --   --  8.6* 8.7*  HCT  --  26.4* 26.0*  --   --  26.1* 27.3*  PLT  --  130*  --   --   --  125* 120*  LABPROT 14.7  --   --   --   --   --   --   INR 1.2  --   --   --   --   --   --   HEPARINUNFRC  --   --   --  0.24* 0.26*  --  0.29*  CREATININE  --  0.76  --   --   --  0.86 1.02*     Assessment: 60yo female subtherapeutic on heparin after arrival from [facility] cath lab w/ IABP placement, for CABG today.  -heparin level= 0.29 on 2400 units/hr  Goal of Therapy:  Heparin level 0.2-0.5 units/ml Monitor platelets by anticoagulation protocol: Yes    Plan:  -Continue heparin 2400 units/hr -Plans for CABG today    Hildred Laser, PharmD Clinical Pharmacist **Pharmacist phone directory can now be found on [email or web address] ([email or web address]).  Listed under [place].

## 2021-01-18 NOTE — Brief Op Note (Signed)
01/13/2021 - 01/18/2021  6:14 PM  PATIENT:  Morgan Daniels  60 y.o. 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  POST-OPERATIVE DIAGNOSIS:   LEFT MAIN/ 3 VESSEL CORONARY ARTERY DISEASE  PROCEDURES:   CORONARY ARTERY BYPASS GRAFTING (CABG) x 4  USING LEFT INTERNAL MAMMARY ARTERY AND LEFT ENDOSCOPIC GREATER SAPHENOUS VEIN CONDUITS   LIMA-LAD SVG-D1 SVG-OM SVG-PDA  TRANSESOPHAGEAL ECHOCARDIOGRAM  APPLICATION OF CELL SAVER  ENDOVEIN HARVEST OF GREATER SAPHENOUS VEIN (Left)  SURGEON:  [name], MD - Primary  PHYSICIAN ASSISTANT: [place]  ASSISTANTS: Thelma Barge, RN, RN First Assistant   ANESTHESIA:   general  EBL:  450 mL   BLOOD ADMINISTERED: PRBC's x 2 units  DRAINS:  mediastinal and left pleural drains    LOCAL MEDICATIONS USED:  NONE  SPECIMEN:  No Specimen  DISPOSITION OF SPECIMEN:  N/A  COUNTS:  YES  DICTATION: .Dragon Dictation  PLAN OF CARE: Admit to inpatient   PATIENT DISPOSITION:  ICU - intubated and hemodynamically stable.   Delay start of Pharmacological VTE agent (>24hrs) due to surgical blood loss or risk of bleeding: yes  Good conduits Diffusely diseased targets Good LV function by echo

## 2021-01-18 NOTE — Anesthesia Procedure Notes (Signed)
Central Venous Catheter Insertion Performed by: Oleta Mouse, MD, anesthesiologist Start/End12/03/2020 12:06 PM, 01/18/2021 12:16 PM Patient location: OR. Preanesthetic checklist: patient identified, IV checked, site marked, risks and benefits discussed, surgical consent, monitors and equipment checked, pre-op evaluation, timeout performed and anesthesia consent Position: supine Patient sedated Hand hygiene performed  and maximum sterile barriers used  Central line was placed.Triple lumen Procedure performed without using ultrasound guided technique. Attempts: 1 Post procedure assessment: blood return through all ports and free fluid flow  Patient tolerated the procedure well with no immediate complications.

## 2021-01-18 NOTE — Anesthesia Procedure Notes (Signed)
Arterial Line Insertion Start/End12/03/2020 12:00 PM, 01/18/2021 12:05 PM Performed by: Betha Loa, CRNA  Patient location: OR. Preanesthetic checklist: patient identified, IV checked, site marked, risks and benefits discussed, surgical consent, monitors and equipment checked, pre-op evaluation, timeout performed and anesthesia consent Lidocaine 1% used for infiltration Left, radial was placed Catheter size: 20 G Hand hygiene performed  and maximum sterile barriers used   Attempts: 1 Procedure performed without using ultrasound guided technique. Following insertion, dressing applied and Biopatch. Post procedure assessment: normal and unchanged  Patient tolerated the procedure well with no immediate complications.

## 2021-01-18 NOTE — Progress Notes (Signed)
1 Day Post-Op Procedure(s) (LRB): CANCELLED PROCEDURE (N/A) Subjective: Opening eyes and moving  Objective: Vital signs in last 24 hours: Temp:  [98.8 F (37.1 C)-100.2 F (37.9 C)] 98.9 F (37.2 C) (12/02 0729) Pulse Rate:  [62-76] 70 (12/02 0728) Cardiac Rhythm: Normal sinus rhythm (12/02 0400) Resp:  [16-23] 16 (12/02 0728) BP: (97-132)/(31-61) 107/58 (12/02 0728) SpO2:  [95 %-100 %] 99 % (12/02 0734) FiO2 (%):  [40 %-50 %] 40 % (12/02 0728) Weight:  [87.5 kg] 87.5 kg (12/02 0445)  Hemodynamic parameters for last 24 hours: CVP:  [4 mmHg-7 mmHg] 6 mmHg  Intake/Output from previous day: 12/01 0701 - 12/02 0700 In: 2269.8 [I.V.:1756.1; NG/GT:110; IV Piggyback:403.7] Out: 3340 [Urine:2890; Emesis/NG output:450] Intake/Output this shift: No intake/output data recorded.  General appearance: intubated Neurologic: opening eyes and moving spontaneously Heart: regular rate and rhythm Lungs: clear anteriorly Extremities: IABP in place, good cap refill  Lab Results: Recent Labs    01/17/21 1712 01/18/21 0408  WBC 11.6* 11.2*  HGB 8.6* 8.7*  HCT 26.1* 27.3*  PLT 125* 120*   BMET:  Recent Labs    01/17/21 1712 01/18/21 0408  NA 141 140  K 3.8 3.7  CL 101 99  CO2 30 32  GLUCOSE 123* 137*  BUN 31* 32*  CREATININE 0.86 1.02*  CALCIUM 8.7* 8.9    PT/INR:  Recent Labs    01/16/21 2138  LABPROT 14.7  INR 1.2   ABG    Component Value Date/Time   PHART 7.482 (H) 01/17/2021 0432   HCO3 32.7 (H) 01/17/2021 0432   TCO2 34 (H) 01/17/2021 0432   ACIDBASEDEF 9.6 (H) 01/12/2021 2122   O2SAT 74.8 01/18/2021 0408   CBG (last 3)  Recent Labs    01/17/21 2324 01/18/21 0408 01/18/21 0728  GLUCAP 118* 127* 126*    Assessment/Plan: S/P Procedure(s) (LRB): CANCELLED PROCEDURE (N/A) Has remained relatively stable over past 24 hours Only a low grade temp and has not developed sepsis or ARDS. FiO2 down to 40% Will proceed with high risk CABG later today Family  updated   LOS: 5 days    [name] 01/18/2021

## 2021-01-18 NOTE — Progress Notes (Signed)
[facility] ADULT ICU REPLACEMENT PROTOCOL   The patient does apply for the [facility] Adult ICU Electrolyte Replacment Protocol based on the criteria listed below:   1.Exclusion criteria: TCTS patients, ECMO patients, and Dialysis patients 2. Is GFR >/= 30 ml/min? Yes.    Patient's GFR today is >60 3. Is SCr </= 2? Yes.   Patient's SCr is 1.02 mg/dL 4. Did SCr increase >/= 0.5 in 24 hours? No. 5.Pt's weight >40kg  Yes.   6. Abnormal electrolyte(s): mag 1.9, K+ 3.7  7. Electrolytes replaced per protocol 8.  Call MD STAT for K+ </= 2.5, Phos </= 1, or Mag </= 1 Physician:  n/a  Morgan Daniels 01/18/2021 5:16 AM

## 2021-01-18 NOTE — Progress Notes (Signed)
Nutrition Follow-up  DOCUMENTATION CODES:   Not applicable  INTERVENTION:   If unable to extubate post-op recommend initiating tube feeds via OG Start Vital AF 1.2 @ 20 mL/hr and advance by 10 mL q8h until goal of 60 mL/hr (1440 mL/day) is met. 150 mL free water flushes q6h   Provides 1728 kcal, 108 gm PRO, 1168 mL free water (1768 mL total free water)  NUTRITION DIAGNOSIS:   Inadequate oral intake related to acute illness as evidenced by NPO status. - Ongoing   GOAL:   Patient will meet greater than or equal to 90% of their needs - Ongoing  MONITOR:   Vent status, Labs, TF tolerance, Weight trends  REASON FOR ASSESSMENT:   Ventilator, Consult Enteral/tube feeding initiation and management  ASSESSMENT:   60 yo female admitted with acute respiratory failure secondary to acute pulmonary edema with acute decompensated heart failure, NSTEMI, cardiogenic shock with IABP. PMH includes HTN, DM, HLD, hx peptic ulcer, COPD, CAD  11/27 - IABP insertion, Intubated 11/30 - Extubated; re-intubated  Pt re-intubated on 11/30 due to multiple episodes of vomiting, concerned for aspiration.   Unable to see pt due to being transported to the OR for CABG at time of visit.  Patient is currently intubated on ventilator support. MV: 7.7 L/min MAP (a-line): 119 Temp (24hrs), Avg:99.4 F (37.4 C), Min:98.8 F (37.1 C), Max:100.2 F (37.9 C) Continuous Meds: Norepinephrine Nitroglycerin Fentanyl Heparin  Per Cardiology's note, plan to remove IABP tomorrow .   Medications reviewed and include: Colace, Epinephrine, SSI 0-20 units q4h, Semglee 25 units, Myxredlin 100 units, Magnesium Sulfate, Reglan, Protonix, Miralax, Potassium Chloride, Senokot, IV antibiotics Labs reviewed: BUN 32, Phosphorus 6.7, 24 hr BG trends 114-136   Diet Order:   Diet Order             Diet NPO time specified  Diet effective midnight                   EDUCATION NEEDS:   Not appropriate for  education at this time  Skin:  Skin Assessment: Reviewed RN Assessment  Last BM:  Prior to Admission  Height:   Ht Readings from Last 1 Encounters:  01/17/21 '5\' 6"'  (1.676 m)    Weight:   Wt Readings from Last 1 Encounters:  01/18/21 87.5 kg    Ideal Body Weight:  59 kg  BMI:  Body mass index is 31.14 kg/m.  Estimated Nutritional Needs:   Kcal:  1700-1900 kcals  Protein:  80-105 g  Fluid:  >/= 1.7 L    Harce Volden Louie Casa, RD, LDN Clinical Dietitian See [facility] for contact information.

## 2021-01-18 NOTE — Progress Notes (Signed)
NAME:  Morgan Daniels, MRN:  [id number], DOB:  Dec 29, 1960, LOS: 5 ADMISSION DATE:  01/13/2021, CONSULTATION DATE:  11/28 REFERRING MD:  Marcelle Smiling, REASON FOR CONSULT:  ventilator assistance   History of Present Illness:  Patient is encephalopathic and/or intubated. Therefore history has been obtained from chart review.  Morgan Daniels is a 60 y.o. female who is seen in consultation at the request of Dr. Marcelle Smiling for recommendations on further evaluation and management of ventilator assistance.   They have a pertnent past medical history of PAD, HTN, HLD, current tobacco abuse, COPD, DM2  Morgan Daniels is an 60 y.o. who presented to the [facility] ED on 01/12/2021 via EMS due to acute respiratory failure and unresponsiveness.  Per ED documentation and EMS report the patient began having difficulty breathing and told her husband that she felt as if she was going to die.  Her husband placed her on his home CPAP machine while calling EMS.  Upon EMSs arrival she was agonal he breathing and significantly hypoxic, they transitioned her to a [facility] airway for transport. The patient never lost pulses. She was intubated in the ED. The patient did not meet STEMI criteria. She was  worked up for PE VS PNU. Cefepime and vancomycin was given. CTA negative for PE, CTH negative. The patient was extubated 11/27. The patient later developed respiratory distress, was reintubated, CODE STEMI was called. In cath lab they were found to have a 100% occluded RCA. A IABP was placed. Transferred to [facility] to be managed by cardiology.She is scheduled for CABG 21/03/2020  PCCM was consulted for ventilatory assistance   Pertinent  Medical History  PAD, HTN, HLD, current tobacco abuse, COPD, DM2, CVA, peptic ulcer,   Significant Hospital Events: Including procedures, antibiotic start and stop dates in addition to other pertinent events   01/12/2021: Patient admitted to [facility] ICU with acute hypoxic respiratory failure requiring emergent  intubation and mechanical ventilatory support secondary to pneumonia versus acute heart failure.  CTA negative for PE, CTh negative. 01/13/21: Patient extubated during the day, ECHO: LVEF 60-65%, no wall motion abnormalities, G1DD. cardiology planned for scheduled LHC. Patient then developed respiratory distress and was emergently re-intubated CODE STEMI called patient taken to cath lab, RCA 100% occluded > IABP placed emergently and patient transferred [facility] emergently. 11/28 TXR to [facility]. Cardiology primary. PCCM consulted 11/30 responded well to diuresis. EEG done 11/29 no seizure activity. Passed her SBT. Extubated. Diuretics continued. Still on IABP. Cultures from [facility] grew strep agalactiae abx narrowed to ampicillin w/ plan to complete total of 7 days.  12/2 CABG today ar 12:30  Interim History / Subjective:  Pt. Is intubated and sedated. She is net negative 3.9 L with 2269 UO last 24 hours.  Na 140/ K 3.7/ Creatinine 1.02/Phos 6.7 WBC is 11.2/ HGB is 8.7, platelets are 120,000  Objective   Blood pressure 105/63, pulse 91, temperature 98.9 F (37.2 C), temperature source Oral, resp. rate 16, height 5\' 6"  (1.676 m), weight 87.5 kg, SpO2 96 %. CVP:  [4 mmHg-7 mmHg] 5 mmHg  Vent Mode: PRVC FiO2 (%):  [40 %-50 %] 40 % Set Rate:  [16 bmp] 16 bmp Vt Set:  [450 mL-470 mL] 470 mL PEEP:  [8 cmH20] 8 cmH20 Plateau Pressure:  [17 cmH20-22 cmH20] 18 cmH20   Intake/Output Summary (Last 24 hours) at 01/18/2021 0855 Last data filed at 01/18/2021 0800 Gross per 24 hour  Intake 2277 ml  Output 3080 ml  Net -803 ml   Filed  Weights   01/16/21 0500 01/17/21 0530 01/18/21 0445  Weight: 90.1 kg 89.6 kg 87.5 kg   Net -2.79L for admission  Examination:  General: critically ill appearing woman lying in bed , sedated and intubated, in NAD HENT: [place]/AT, eyes anicteric, No LAD, Mild JVD, ETT, OGT Pulm : Bilateral chest excursion, rhonchi, thick tan secretions Card: S1S2, mechanical IABP sounds Abd:  soft, NT, ND, BS diminished, Body mass index is 31.14 kg/m.  Ext: RLE IABP, minimal dried blood on dressing, warm, no edema, brisk refill per nailbeds, no obvious deformities. Neuro: RASS 0 to -1 , nodding to answer questions, strong cough, following simple commands GU foley with  dilute yellow urine Derm: no rashes, warm & dry, no lesions  Resolved Hospital Problem list     Assessment & Plan:  NSTEMI , severe 3- vessel CAD with 80% distal left main, 95% stenosis ostial LAD, 95% stenosis ostial left circumflex, 100% occluded proximal RCA Cardiogenic shock with IABP in place to augment coronary perfusion. 11/27 ECHO LVEF 60-65%, 11/27 LHC LVEF now 40% with anteroapical and inferoapical hypokinesis. -IABP per cardiology -Con't aspirin, metoprolol - CABG 01/18/2021 at noon , stable on vent overnight  - Repletion of K and Mag 01/18/2021 am - High-risk given prolonged hospitalization and recurrent need for MV this admission. -Tele monitoring, maintain optimal electrolytes. -needs to quit smoking  Acute respiratory failure with hypoxia due to acute pulmonary edema. CAP- Multifocal pneumonia with possible aspiration. Growing Strep agalactiae pneumonia at admission Hx COPD Tobacco use Aspiration pneumonitis vs pneumonia -LTVV, 4-8cc/kg IBW with goal Pplat<30 and DP<15 -PAD protocol -VAP prevention protocol -Repeat trach aspirate. ( Pending 12/2) Escalate antibiotics to zosyn -con't diuresis -no plans to extubate pre-operatively  -con't brovana, yupelri plus albuterol PRN - Will add mucolytic per tube as secretions are thick  Ileus -Continue reglan -increase bowel regimen -hold TF>> off 12/2 at midnight for surgery  Acute encephalopathy> resolved. -monitor -avoid delirium-producing meds  DM2 w/ hyperglycemia- controlled -con't levemir 25 units BID -SSI PRN -goal BG 140-180 - careful management while TF are on hold for surgery  HX HTN HLD -statin daily -con't  metoprolol  HX peptic ulcer -PPI  Best Practice (right click and "Reselect all SmartList Selections" daily)   Diet/type: NPO w/ meds via tube DVT prophylaxis: systemic heparin GI prophylaxis: PPI Lines: Central line and Arterial Line Foley:  Yes, and it is still needed Code Status:  full code Last date of multidisciplinary goals of care discussion [per primary]    This patient is critically ill with multiple organ system failure which requires frequent high complexity decision making, assessment, support, evaluation, and titration of therapies. This was completed through the application of advanced monitoring technologies and extensive interpretation of multiple databases. During this encounter critical care time was devoted to patient care services described in this note for 37 minutes.  Magdalen Spatz, MSN, AGACNP-BC [place] for personal pager PCCM on call pager [phone]  01/18/2021 9:15 AM

## 2021-01-18 NOTE — Transfer of Care (Signed)
Immediate Anesthesia Transfer of Care Note  Patient: Morgan Daniels  Procedure(s) Performed: CORONARY ARTERY BYPASS GRAFTING (CABG) x 4  USING LEFT INTERNAL MAMMARY ARTERY AND LEFT ENDOSCOPIC GREATER SAPHENOUS VEIN CONDUITS (Chest) TRANSESOPHAGEAL ECHOCARDIOGRAM (TEE) APPLICATION OF CELL SAVER ENDOVEIN HARVEST OF GREATER SAPHENOUS VEIN (Left)  Patient Location: SICU  Anesthesia Type:General  Level of Consciousness: Patient remains intubated per anesthesia plan  Airway & Oxygen Therapy: Patient remains intubated per anesthesia plan and Patient placed on Ventilator (see vital sign flow sheet for setting)  Post-op Assessment: Report given to RN and Post -op Vital signs reviewed and stable  Post vital signs: Reviewed and stable  Last Vitals:  Vitals Value Taken Time  BP 145/49   Temp    Pulse 90   Resp 16   SpO2 95     Last Pain:  Vitals:   01/18/21 0729  TempSrc: Oral  PainSc:          Complications: No notable events documented.

## 2021-01-18 NOTE — Anesthesia Preprocedure Evaluation (Addendum)
Anesthesia Evaluation  Patient identified by MRN, date of birth, ID band Patient unresponsive    Reviewed: Unable to perform ROS - Chart review only  History of Anesthesia Complications Negative for: history of anesthetic complications  Airway Mallampati: Intubated       Dental   Pulmonary  intubated   breath sounds clear to auscultation       Cardiovascular hypertension, + Past MI and + Peripheral Vascular Disease   Rhythm:Regular  1. Left ventricular ejection fraction, by estimation, is 60 to 65%. The  left ventricle has normal function. The left ventricle has no regional  wall motion abnormalities. Left ventricular diastolic parameters are  consistent with Grade I diastolic  dysfunction (impaired relaxation).  2. Right ventricular systolic function is normal. The right ventricular  size is normal. Tricuspid regurgitation signal is inadequate for assessing  PA pressure.  3. The mitral valve is normal in structure. Mild mitral valve  regurgitation. No evidence of mitral stenosis.  4. The aortic valve is normal in structure. Aortic valve regurgitation is  not visualized. Aortic valve sclerosis/calcification is present, without  any evidence of aortic stenosis.  5. The inferior vena cava is dilated in size with >50% respiratory  variability, suggesting right atrial pressure of 8 mmHg.    [name]  Prox RCA lesion is 100% stenosed. Jorene Minors LM to Ost LAD lesion is 95% stenosed. Colon Flattery Cx lesion is 95% stenosed. .  Mid Cx lesion is 70% stenosed. .  Mid LAD lesion is 75% stenosed. .  Mid LM to Dist LM lesion is 80% stenosed. .  The left ventricular ejection fraction is 35-45% by visual estimate.  1.  Non-ST elevation myocardial infarction 2.  Three-vessel coronary artery disease with 80% distal left main, 95% stenosis ostial LAD, 95% stenosis ostial left circumflex, occluded proximal RCA 3.  Mildly reduced left ventricular  function with estimated LV ejection fraction 40% with anteroapical and inferoapical hypokinesis 4.  Intra-aortic balloon pump placed    Neuro/Psych PSYCHIATRIC DISORDERS Anxiety Depression  Neuromuscular disease CVA    GI/Hepatic hiatal hernia, PUD, GERD  ,  Endo/Other  diabetesLab Results      Component                Value               Date                      HGBA1C                   8.8 (H)             01/14/2021             Renal/GU Renal diseaseLab Results      Component                Value               Date                      CREATININE               1.02 (H)            01/18/2021                Musculoskeletal  (+) Fibromyalgia -  Abdominal   Peds  Hematology  (+) Blood dyscrasia, anemia , Lab Results  Component                Value               Date                      WBC                      11.2 (H)            01/18/2021                HGB                      8.7 (L)             01/18/2021                HCT                      27.3 (L)            01/18/2021                MCV                      84.0                01/18/2021                PLT                      120 (L)             01/18/2021              Anesthesia Other Findings   Reproductive/Obstetrics                            Anesthesia Physical Anesthesia Plan  ASA: 5  Anesthesia Plan: General   Post-op Pain Management:    Induction:   PONV Risk Score and Plan: 3 and Treatment may vary due to age or medical condition  Airway Management Planned: Oral ETT  Additional Equipment: Arterial line, CVP, PA Cath and TEE  Intra-op Plan:   Post-operative Plan: Post-operative intubation/ventilation  Informed Consent:     History available from chart only  Plan Discussed with: CRNA and Anesthesiologist  Anesthesia Plan Comments:         Anesthesia Quick Evaluation

## 2021-01-18 NOTE — Anesthesia Procedure Notes (Signed)
Central Venous Catheter Insertion Performed by: Oleta Mouse, MD, anesthesiologist Start/End12/03/2020 12:06 PM, 01/18/2021 12:16 PM Patient location: OR. Preanesthetic checklist: patient identified, IV checked, site marked, risks and benefits discussed, surgical consent, monitors and equipment checked, pre-op evaluation, timeout performed and anesthesia consent Position: supine Patient sedated Hand hygiene performed  and maximum sterile barriers used  Catheter size: 9 Fr Total catheter length -1. MAC introducer Procedure performed using ultrasound guided technique. Ultrasound Notes:anatomy identified, needle tip was noted to be adjacent to the nerve/plexus identified, no ultrasound evidence of intravascular and/or intraneural injection and image(s) printed for medical record Attempts: 1 Following insertion, line sutured, dressing applied and Biopatch. Post procedure assessment: blood return through all ports, free fluid flow and no air  Patient tolerated the procedure well with no immediate complications.

## 2021-01-18 NOTE — Progress Notes (Addendum)
Progress Note  Patient Name: Morgan Daniels Date of Encounter: 01/18/2021  [facility] Cardiologist: None [place]- Dr. Saralyn Pilar  Subjective   Intubated, sedated  Inpatient Medications    Scheduled Meds:  arformoterol  15 mcg Nebulization BID   aspirin  81 mg Per Tube Daily   atorvastatin  80 mg Per Tube Daily   chlorhexidine gluconate (MEDLINE KIT)  15 mL Mouth Rinse BID   Chlorhexidine Gluconate Cloth  6 each Topical Daily   docusate  100 mg Per Tube BID   epinephrine  0-10 mcg/min Intravenous To OR   heparin-papaverine-plasmalyte irrigation   Irrigation To OR   insulin aspart  0-20 Units Subcutaneous Q4H   insulin glargine-yfgn  25 Units Subcutaneous BID   insulin   Intravenous To OR   ipratropium-albuterol  3 mL Nebulization Q6H   lidocaine  1 application Urethral Once   magnesium sulfate  40 mEq Other To OR   mouth rinse  15 mL Mouth Rinse 10 times per day   metoCLOPramide (REGLAN) injection  5 mg Intravenous Q6H   metoprolol tartrate  12.5 mg Per Tube BID   metoprolol tartrate  12.5 mg Oral Once   pantoprazole (PROTONIX) IV  40 mg Intravenous Daily   phenylephrine  30-200 mcg/min Intravenous To OR   polyethylene glycol  17 g Per Tube BID   potassium chloride  80 mEq Other To OR   Racepinephrine HCl  0.5 mL Nebulization Once   revefenacin  175 mcg Nebulization Daily   senna  1 tablet Per Tube BID   sodium chloride flush  10-40 mL Intracatheter Q12H   tranexamic acid  15 mg/kg Intravenous To OR   tranexamic acid  2 mg/kg Intracatheter To OR   Continuous Infusions:   ceFAZolin (ANCEF) IV      ceFAZolin (ANCEF) IV     dexmedetomidine     fentaNYL infusion INTRAVENOUS 275 mcg/hr (01/18/21 1000)   heparin 30,000 units/NS 1000 mL solution for CELLSAVER     heparin 2,400 Units/hr (01/18/21 1000)   milrinone     nitroGLYCERIN 10 mcg/min (01/18/21 1000)   nitroGLYCERIN     norepinephrine     piperacillin-tazobactam (ZOSYN)  IV 12.5 mL/hr at 01/18/21 1000    promethazine (PHENERGAN) injection (IM or IVPB) Stopped (01/16/21 2233)   tranexamic acid (CYKLOKAPRON) infusion ([facility])     vancomycin     PRN Meds: acetaminophen (TYLENOL) oral liquid 160 mg/5 mL, albuterol, bisacodyl, guaiFENesin, labetalol, midazolam, midazolam, ondansetron (ZOFRAN) IV, phenol, promethazine (PHENERGAN) injection (IM or IVPB), sodium chloride flush, white petrolatum   Vital Signs    Vitals:   01/18/21 0800 01/18/21 0847 01/18/21 0900 01/18/21 1000  BP: (!) 92/51 105/63 (!) 92/46 (!) 94/46  Pulse: 83 91 (!) 43   Resp: 16  16   Temp:      TempSrc:      SpO2: 96%  96%   Weight:      Height:        Intake/Output Summary (Last 24 hours) at 01/18/2021 1056 Last data filed at 01/18/2021 1000 Gross per 24 hour  Intake 2360.66 ml  Output 3060 ml  Net -699.34 ml   Last 3 Weights 01/18/2021 01/17/2021 01/16/2021  Weight (lbs) 192 lb 14.4 oz 197 lb 8.5 oz 198 lb 10.2 oz  Weight (kg) 87.5 kg 89.6 kg 90.1 kg      Telemetry    NSR - Personally Reviewed  ECG      Physical Exam   GEN:  No acute distress.   Neck: No JVD Cardiac: RRR, no murmurs, rubs, or gallops.  Respiratory: Clear to auscultation bilaterally. GI: Soft, nontender, non-distended  MS: No edema; No deformity. Right groin without hematoma; right foot appears to be perfused well, warm Neuro:  Nonfocal  Psych: Normal affect   Labs    High Sensitivity Troponin:   Recent Labs  Lab 01/13/21 0143 01/13/21 0402 01/13/21 1256 01/13/21 1605 01/13/21 1827  TROPONINIHS 2,500* 2,772* 2,913* 2,587* 1,790*     Chemistry Recent Labs  Lab 01/12/21 2107 01/13/21 0402 01/17/21 0410 01/17/21 0432 01/17/21 1712 01/18/21 0408  NA 135   < > 137 139 141 140  K 3.6   < > 3.9 3.5 3.8 3.7  CL 99   < > 96*  --  101 99  CO2 18*   < > 31  --  30 32  GLUCOSE 446*   < > 144*  --  123* 137*  BUN 20   < > 28*  --  31* 32*  CREATININE 0.96   < > 0.76  --  0.86 1.02*  CALCIUM 9.0   < > 9.4  --  8.7* 8.9  MG 2.3    < > 1.8  --  1.9 1.9  PROT 7.9  --   --   --   --   --   ALBUMIN 3.4*  --   --   --   --   --   AST 39  --   --   --   --   --   ALT 24  --   --   --   --   --   ALKPHOS 78  --   --   --   --   --   BILITOT 0.7  --   --   --   --   --   GFRNONAA >60   < > >60  --  >60 >60  ANIONGAP 18*   < > 10  --  10 9   < > = values in this interval not displayed.    Lipids  Recent Labs  Lab 01/16/21 0338  CHOL 138  TRIG 184*  HDL 30*  LDLCALC 71  CHOLHDL 4.6    Hematology Recent Labs  Lab 01/17/21 0410 01/17/21 0432 01/17/21 1712 01/18/21 0408  WBC 12.1*  --  11.6* 11.2*  RBC 3.23*  --  3.11* 3.25*  HGB 8.8* 8.8* 8.6* 8.7*  HCT 26.4* 26.0* 26.1* 27.3*  MCV 81.7  --  83.9 84.0  MCH 27.2  --  27.7 26.8  MCHC 33.3  --  33.0 31.9  RDW 12.8  --  12.8 12.9  PLT 130*  --  125* 120*   Thyroid No results for input(s): TSH, FREET4 in the last 168 hours.  BNP Recent Labs  Lab 01/13/21 0402 01/15/21 0412  BNP 527.5* 77.3    DDimer No results for input(s): DDIMER in the last 168 hours.   Radiology    DG Chest Port 1 View  Result Date: 01/18/2021 CLINICAL DATA:  Balloon pump. EXAM: PORTABLE CHEST 1 VIEW COMPARISON:  January 17, 2021. FINDINGS: Stable cardiomediastinal silhouette. Endotracheal and nasogastric tubes are unchanged in position. Distal tip of balloon catheter is seen projected over expected position of proximal descending thoracic aorta and is unchanged compared to prior exam. Mild left lower lobe atelectasis or infiltrate is noted. Bony thorax is unremarkable. Right-sided PICC line is unchanged in position. IMPRESSION:  Mild left lower lobe pneumonia or atelectasis is noted. Stable support apparatus as described above. Electronically Signed   By: Marijo Conception M.D.   On: 01/18/2021 08:54   DG Chest Port 1 View  Result Date: 01/17/2021 CLINICAL DATA:  Endotracheal tube, pulmonary edema. EXAM: PORTABLE CHEST 1 VIEW COMPARISON:  Chest radiograph dated January 16, 2021  FINDINGS: The heart is enlarged. Pulmonary vascular congestion has slightly improved from prior examination. Bibasilar atelectasis. Endotracheal tube and feeding tube coursing below the diaphragm with tip not included. IMPRESSION: 1. Stable cardiomegaly. Interval improvement in the pulmonary vascular congestion. No focal consolidation or large pleural effusion. 2.  Lines and tubes are unchanged. Electronically Signed   By: Keane Police D.O.   On: 01/17/2021 08:59   DG CHEST PORT 1 VIEW  Result Date: 01/16/2021 CLINICAL DATA:  Respiratory failure EXAM: PORTABLE CHEST 1 VIEW COMPARISON:  01/16/2021, 01/15/2021, 01/14/2021, CT 01/12/2021 FINDINGS: Endotracheal tube tip is about 2.6 cm superior to carina. Esophageal tube tip below the diaphragm but incompletely visualized. Cardiomegaly with vascular congestion and bilateral pulmonary edema. Similar bibasilar airspace disease. Right upper extremity central venous catheter tip over the SVC. IMPRESSION: 1. Support lines and tubes as above 2. Similar cardiomegaly with vascular congestion and pulmonary edema. Continued bibasilar atelectasis or pneumonia. Electronically Signed   By: Donavan Foil M.D.   On: 01/16/2021 20:48    Cardiac Studies     Patient Profile     60 y.o. female complex CAD, NSTEMI.  Assessment & Plan    CAD: IABP in place. Surgery later today.  PLans for IABP to be removed the day after surgery depending on her hemodynamics.    Aggressive secondary prevention including lipid lowering therapy and smoking cessation, DM management.  For questions or updates, please contact [place] Please consult [email or web address] for contact info under        Signed, Larae Grooms, MD  01/18/2021, 10:56 AM

## 2021-01-19 ENCOUNTER — Inpatient Hospital Stay (HOSPITAL_COMMUNITY): Payer: Medicare Other

## 2021-01-19 DIAGNOSIS — I214 Non-ST elevation (NSTEMI) myocardial infarction: Secondary | ICD-10-CM | POA: Diagnosis not present

## 2021-01-19 DIAGNOSIS — J810 Acute pulmonary edema: Secondary | ICD-10-CM | POA: Diagnosis not present

## 2021-01-19 DIAGNOSIS — J690 Pneumonitis due to inhalation of food and vomit: Secondary | ICD-10-CM | POA: Diagnosis not present

## 2021-01-19 DIAGNOSIS — R570 Cardiogenic shock: Secondary | ICD-10-CM | POA: Diagnosis not present

## 2021-01-19 LAB — PREPARE PLATELET PHERESIS
Unit division: 0
Unit division: 0

## 2021-01-19 LAB — POCT I-STAT 7, (LYTES, BLD GAS, ICA,H+H)
Acid-Base Excess: 3 mmol/L — ABNORMAL HIGH (ref 0.0–2.0)
Acid-Base Excess: 3 mmol/L — ABNORMAL HIGH (ref 0.0–2.0)
Acid-Base Excess: 3 mmol/L — ABNORMAL HIGH (ref 0.0–2.0)
Bicarbonate: 28.9 mmol/L — ABNORMAL HIGH (ref 20.0–28.0)
Bicarbonate: 27.5 mmol/L (ref 20.0–28.0)
Bicarbonate: 27 mmol/L (ref 20.0–28.0)
Calcium, Ion: 1.25 mmol/L (ref 1.15–1.40)
Calcium, Ion: 1.2 mmol/L (ref 1.15–1.40)
Calcium, Ion: 1.16 mmol/L (ref 1.15–1.40)
HCT: 31 % — ABNORMAL LOW (ref 36.0–46.0)
HCT: 27 % — ABNORMAL LOW (ref 36.0–46.0)
HCT: 28 % — ABNORMAL LOW (ref 36.0–46.0)
Hemoglobin: 10.5 g/dL — ABNORMAL LOW (ref 12.0–15.0)
Hemoglobin: 9.2 g/dL — ABNORMAL LOW (ref 12.0–15.0)
Hemoglobin: 9.5 g/dL — ABNORMAL LOW (ref 12.0–15.0)
O2 Saturation: 94 %
O2 Saturation: 96 %
O2 Saturation: 94 %
Patient temperature: 36.9
Patient temperature: 37.3
Potassium: 4 mmol/L (ref 3.5–5.1)
Potassium: 4 mmol/L (ref 3.5–5.1)
Potassium: 4.3 mmol/L (ref 3.5–5.1)
Sodium: 142 mmol/L (ref 135–145)
Sodium: 143 mmol/L (ref 135–145)
Sodium: 142 mmol/L (ref 135–145)
TCO2: 30 mmol/L (ref 22–32)
TCO2: 29 mmol/L (ref 22–32)
TCO2: 28 mmol/L (ref 22–32)
pCO2 arterial: 52.6 mmHg — ABNORMAL HIGH (ref 32.0–48.0)
pCO2 arterial: 42.6 mmHg (ref 32.0–48.0)
pCO2 arterial: 38.8 mmHg (ref 32.0–48.0)
pH, Arterial: 7.347 — ABNORMAL LOW (ref 7.350–7.450)
pH, Arterial: 7.418 (ref 7.350–7.450)
pH, Arterial: 7.451 — ABNORMAL HIGH (ref 7.350–7.450)
pO2, Arterial: 74 mmHg — ABNORMAL LOW (ref 83.0–108.0)
pO2, Arterial: 82 mmHg — ABNORMAL LOW (ref 83.0–108.0)
pO2, Arterial: 70 mmHg — ABNORMAL LOW (ref 83.0–108.0)

## 2021-01-19 LAB — GLUCOSE, CAPILLARY
Glucose-Capillary: 111 mg/dL — ABNORMAL HIGH (ref 70–99)
Glucose-Capillary: 112 mg/dL — ABNORMAL HIGH (ref 70–99)
Glucose-Capillary: 114 mg/dL — ABNORMAL HIGH (ref 70–99)
Glucose-Capillary: 117 mg/dL — ABNORMAL HIGH (ref 70–99)
Glucose-Capillary: 119 mg/dL — ABNORMAL HIGH (ref 70–99)
Glucose-Capillary: 129 mg/dL — ABNORMAL HIGH (ref 70–99)
Glucose-Capillary: 139 mg/dL — ABNORMAL HIGH (ref 70–99)
Glucose-Capillary: 148 mg/dL — ABNORMAL HIGH (ref 70–99)
Glucose-Capillary: 163 mg/dL — ABNORMAL HIGH (ref 70–99)
Glucose-Capillary: 187 mg/dL — ABNORMAL HIGH (ref 70–99)
Glucose-Capillary: 189 mg/dL — ABNORMAL HIGH (ref 70–99)
Glucose-Capillary: 207 mg/dL — ABNORMAL HIGH (ref 70–99)

## 2021-01-19 LAB — BASIC METABOLIC PANEL
Anion gap: 10 (ref 5–15)
Anion gap: 9 (ref 5–15)
BUN: 42 mg/dL — ABNORMAL HIGH (ref 6–20)
BUN: 47 mg/dL — ABNORMAL HIGH (ref 6–20)
CO2: 26 mmol/L (ref 22–32)
CO2: 25 mmol/L (ref 22–32)
Calcium: 8.4 mg/dL — ABNORMAL LOW (ref 8.9–10.3)
Calcium: 8.3 mg/dL — ABNORMAL LOW (ref 8.9–10.3)
Chloride: 103 mmol/L (ref 98–111)
Chloride: 105 mmol/L (ref 98–111)
Creatinine, Ser: 1.21 mg/dL — ABNORMAL HIGH (ref 0.44–1.00)
Creatinine, Ser: 1.34 mg/dL — ABNORMAL HIGH (ref 0.44–1.00)
GFR, Estimated: 52 mL/min — ABNORMAL LOW (ref >60)
GFR, Estimated: 46 mL/min — ABNORMAL LOW (ref >60)
Glucose, Bld: 117 mg/dL — ABNORMAL HIGH (ref 70–99)
Glucose, Bld: 220 mg/dL — ABNORMAL HIGH (ref 70–99)
Potassium: 4.4 mmol/L (ref 3.5–5.1)
Potassium: 4.4 mmol/L (ref 3.5–5.1)
Sodium: 139 mmol/L (ref 135–145)
Sodium: 139 mmol/L (ref 135–145)

## 2021-01-19 LAB — CBC
HCT: 30 % — ABNORMAL LOW (ref 36.0–46.0)
HCT: 28.2 % — ABNORMAL LOW (ref 36.0–46.0)
Hemoglobin: 9.8 g/dL — ABNORMAL LOW (ref 12.0–15.0)
Hemoglobin: 9.3 g/dL — ABNORMAL LOW (ref 12.0–15.0)
MCH: 26.8 pg (ref 26.0–34.0)
MCH: 27.6 pg (ref 26.0–34.0)
MCHC: 32.7 g/dL (ref 30.0–36.0)
MCHC: 33 g/dL (ref 30.0–36.0)
MCV: 82.2 fL (ref 80.0–100.0)
MCV: 83.7 fL (ref 80.0–100.0)
Platelets: 94 10*3/uL — ABNORMAL LOW (ref 150–400)
Platelets: 120 10*3/uL — ABNORMAL LOW (ref 150–400)
RBC: 3.65 MIL/uL — ABNORMAL LOW (ref 3.87–5.11)
RBC: 3.37 MIL/uL — ABNORMAL LOW (ref 3.87–5.11)
RDW: 13.5 % (ref 11.5–15.5)
RDW: 13.5 % (ref 11.5–15.5)
WBC: 13.6 10*3/uL — ABNORMAL HIGH (ref 4.0–10.5)
WBC: 13.3 10*3/uL — ABNORMAL HIGH (ref 4.0–10.5)
nRBC: 0 % (ref 0.0–0.2)
nRBC: 0.2 % (ref 0.0–0.2)

## 2021-01-19 LAB — BPAM PLATELET PHERESIS
Blood Product Expiration Date: 202212042359
Blood Product Expiration Date: 202212042359
ISSUE DATE / TIME: 202212021539
ISSUE DATE / TIME: 202212021650
Unit Type and Rh: 5100
Unit Type and Rh: 6200

## 2021-01-19 LAB — COOXEMETRY PANEL
Carboxyhemoglobin: 1.1 % (ref 0.5–1.5)
Methemoglobin: 1 % (ref 0.0–1.5)
O2 Saturation: 61.7 %
Total hemoglobin: 10.1 g/dL — ABNORMAL LOW (ref 12.0–16.0)

## 2021-01-19 LAB — CULTURE, RESPIRATORY W GRAM STAIN

## 2021-01-19 LAB — MAGNESIUM
Magnesium: 3.5 mg/dL — ABNORMAL HIGH (ref 1.7–2.4)
Magnesium: 2.9 mg/dL — ABNORMAL HIGH (ref 1.7–2.4)

## 2021-01-19 LAB — POCT ACTIVATED CLOTTING TIME: Activated Clotting Time: 161 seconds

## 2021-01-19 MED ORDER — TRAMADOL HCL 50 MG PO TABS
50.0000 mg | ORAL_TABLET | Freq: Once | ORAL | Status: AC
  Administered 2021-01-19: 50 mg

## 2021-01-19 MED ORDER — ENOXAPARIN SODIUM 40 MG/0.4ML IJ SOSY
40.0000 mg | PREFILLED_SYRINGE | Freq: Every day | INTRAMUSCULAR | Status: DC
  Administered 2021-01-19 – 2021-01-24 (×6): 40 mg via SUBCUTANEOUS
  Filled 2021-01-19 (×6): qty 0.4

## 2021-01-19 MED ORDER — TRAMADOL HCL 50 MG PO TABS: 50.0000 mg | ORAL_TABLET | Freq: Once | ORAL | Status: DC

## 2021-01-19 MED ORDER — INSULIN ASPART 100 UNIT/ML IJ SOLN
0.0000 [IU] | INTRAMUSCULAR | Status: DC
  Administered 2021-01-19 (×2): 4 [IU] via SUBCUTANEOUS
  Administered 2021-01-19: 8 [IU] via SUBCUTANEOUS
  Administered 2021-01-20 (×2): 4 [IU] via SUBCUTANEOUS
  Administered 2021-01-20: 8 [IU] via SUBCUTANEOUS
  Administered 2021-01-20: 2 [IU] via SUBCUTANEOUS

## 2021-01-19 NOTE — Progress Notes (Signed)
[address]       [place] [address]             [phone]                 1 Day Post-Op Procedure(s) (LRB): CORONARY ARTERY BYPASS GRAFTING (CABG) x 4  USING LEFT INTERNAL MAMMARY ARTERY AND LEFT ENDOSCOPIC GREATER SAPHENOUS VEIN CONDUITS (N/A) TRANSESOPHAGEAL ECHOCARDIOGRAM (TEE) (N/A) APPLICATION OF CELL SAVER ENDOVEIN HARVEST OF GREATER SAPHENOUS VEIN (Left)   Events: No events overnight _______________________________________________________________ Vitals: BP (!) 118/52   Pulse 70   Temp 99.1 F (37.3 C)   Resp (!) 22   Ht 5\' 6"  (1.676 m)   Wt 90.1 kg   LMP  (LMP Unknown) Comment: pt intubated  SpO2 95%   BMI 32.06 kg/m  Filed Weights   01/17/21 0530 01/18/21 0445 01/19/21 0515  Weight: 89.6 kg 87.5 kg 90.1 kg     - Neuro: eyes open to voice.  Not following commands.    - Cardiovascular: sinus  Drips: amio 30, neo 15 IABP:  good augmentation.  Pause with stable BP   CVP:  [12 mmHg-20 mmHg] 14 mmHg   - Pulm:  Vent Mode: PRVC FiO2 (%):  [40 %] 40 % Set Rate:  [16 bmp-22 bmp] 22 bmp Vt Set:  [470 mL] 470 mL PEEP:  [8 cmH20] 8 cmH20 Plateau Pressure:  [19 cmH20-22 cmH20] 22 cmH20  ABG    Component Value Date/Time   PHART 7.451 (H) 01/19/2021 0324   PCO2ART 38.8 01/19/2021 0324   PO2ART 70 (L) 01/19/2021 0324   HCO3 27.0 01/19/2021 0324   TCO2 28 01/19/2021 0324   ACIDBASEDEF 9.6 (H) 01/12/2021 2122   O2SAT 61.7 01/19/2021 0330    - Abd: ND - Extremity: cool  .Intake/Output      12/02 0701 12/03 0700 12/03 0701 12/04 0700   I.V. (mL/kg) 3084.9 (34.2)    Blood 961    Other 60    NG/GT 240    IV Piggyback 1315.4    Total Intake(mL/kg) 5661.3 (62.8)    Urine (mL/kg/hr) 1790 (0.8)    Emesis/NG output 250    Other 500    Stool 0    Blood 450    Chest Tube 90    Total Output 3080    Net +2581.3         Stool Occurrence 0 x       _______________________________________________________________ Labs: CBC  Latest Ref Rng & Units 01/19/2021 01/19/2021 01/18/2021  WBC 4.0 - 10.5 K/uL - 13.6(H) -  Hemoglobin 12.0 - 15.0 g/dL 9.5(L) 9.8(L) 9.2(L)  Hematocrit 36.0 - 46.0 % 28.0(L) 30.0(L) 27.0(L)  Platelets 150 - 400 K/uL - 94(L) -   CMP Latest Ref Rng & Units 01/19/2021 01/19/2021 01/18/2021  Glucose 70 - 99 mg/dL - 117(H) -  BUN 6 - 20 mg/dL - 42(H) -  Creatinine 0.44 - 1.00 mg/dL - 1.21(H) -  Sodium 135 - 145 mmol/L 142 139 143  Potassium 3.5 - 5.1 mmol/L 4.3 4.4 4.0  Chloride 98 - 111 mmol/L - 103 -  CO2 22 - 32 mmol/L - 26 -  Calcium 8.9 - 10.3 mg/dL - 8.4(L) -  Total Protein 6.5 - 8.1 g/dL - - -  Total Bilirubin 0.3 - 1.2 mg/dL - - -  Alkaline Phos 38 - 126 U/L - - -  AST 15 - 41 U/L - - -  ALT 0 - 44 U/L - - -  CXR: PV congestion  _______________________________________________________________  Assessment and Plan: POD 1 s/p CABG  Neuro: wean sedation.  Will need better neuro assessment prior to extubattion CV: will remove IABP.  On amio gtt.  Will convert to PO once extubated.  Wean neo as tolerated Pulm: wean vent as tolerated Renal: creat up slightly.  Will continue to follow GI: NPO for now Heme: stable ID: [email or web address] Endo: SSI today Dispo: continue ICU care   [place] 01/19/2021 8:03 AM

## 2021-01-19 NOTE — Progress Notes (Signed)
ACT 161. Lightfoot MD at bedside and removed IABP with current ACT. Femostop applied by Kipp Brood MD. RN managing Femostop per protocol. See assessment flowsheet.   Luberta Mutter RN

## 2021-01-19 NOTE — Progress Notes (Signed)
RT attempted wean at this time per MD verbal order, however, patient had just had episode and dropped her sats so RN had to give meds. Patient had minimal patient effort. RN to notify RT when patient is more alert for weaning.

## 2021-01-19 NOTE — Anesthesia Postprocedure Evaluation (Signed)
Anesthesia Post Note  Patient: Rojean Ige  Procedure(s) Performed: CORONARY ARTERY BYPASS GRAFTING (CABG) x 4  USING LEFT INTERNAL MAMMARY ARTERY AND LEFT ENDOSCOPIC GREATER SAPHENOUS VEIN CONDUITS (Chest) TRANSESOPHAGEAL ECHOCARDIOGRAM (TEE) APPLICATION OF CELL SAVER ENDOVEIN HARVEST OF GREATER SAPHENOUS VEIN (Left)     Patient location during evaluation: SICU Anesthesia Type: General Level of consciousness: sedated Pain management: pain level controlled Vital Signs Assessment: post-procedure vital signs reviewed and stable Respiratory status: patient remains intubated per anesthesia plan Cardiovascular status: stable Postop Assessment: no apparent nausea or vomiting Anesthetic complications: no   No notable events documented.  Last Vitals:  Vitals:   01/19/21 0808 01/19/21 1118  BP: 113/60 128/62  Pulse:  70  Resp:  (!) 24  Temp:  37.6 C  SpO2:  97%    Last Pain:  Vitals:   01/18/21 0729  TempSrc: Oral  PainSc:                  Simi Briel

## 2021-01-19 NOTE — Progress Notes (Addendum)
NAME:  Morgan Daniels, MRN:  [id number], DOB:  Feb 11, 1961, LOS: 6 ADMISSION DATE:  01/13/2021, CONSULTATION DATE:  11/28 REFERRING MD:  Marcelle Smiling, REASON FOR CONSULT:  ventilator assistance   History of Present Illness:  Patient is encephalopathic and/or intubated. Therefore history has been obtained from chart review.  Morgan Daniels is a 60 y.o. female who is seen in consultation at the request of Dr. Marcelle Smiling for recommendations on further evaluation and management of ventilator assistance.   They have a pertnent past medical history of PAD, HTN, HLD, current tobacco abuse, COPD, DM2  Morgan Daniels is an 60 y.o. who presented to the [facility] ED on 01/12/2021 via EMS due to acute respiratory failure and unresponsiveness.  Per ED documentation and EMS report the patient began having difficulty breathing and told her husband that she felt as if she was going to die.  Her husband placed her on his home CPAP machine while calling EMS.  Upon EMSs arrival she was agonal he breathing and significantly hypoxic, they transitioned her to a [facility] airway for transport. The patient never lost pulses. She was intubated in the ED. The patient did not meet STEMI criteria. She was  worked up for PE VS PNU. Cefepime and vancomycin was given. CTA negative for PE, CTH negative. The patient was extubated 11/27. The patient later developed respiratory distress, was reintubated, CODE STEMI was called. In cath lab they were found to have a 100% occluded RCA. A IABP was placed. Transferred to [facility] to be managed by cardiology.She is scheduled for CABG 21/03/2020  PCCM was consulted for ventilatory assistance   Pertinent  Medical History  PAD, HTN, HLD, current tobacco abuse, COPD, DM2, CVA, peptic ulcer,   Significant Hospital Events: Including procedures, antibiotic start and stop dates in addition to other pertinent events   01/12/2021: Patient admitted to [facility] ICU with acute hypoxic respiratory failure requiring emergent  intubation and mechanical ventilatory support secondary to pneumonia versus acute heart failure.  CTA negative for PE, CTh negative. 01/13/21: Patient extubated during the day, ECHO: LVEF 60-65%, no wall motion abnormalities, G1DD. cardiology planned for scheduled LHC. Patient then developed respiratory distress and was emergently re-intubated CODE STEMI called patient taken to cath lab, RCA 100% occluded > IABP placed emergently and patient transferred to [facility] emergently. 11/28 TXR to [facility]. Cardiology primary. PCCM consulted 11/30 responded well to diuresis. EEG done 11/29 no seizure activity. Passed her SBT. Extubated. Diuretics continued. Still on IABP. Cultures from [facility] grew strep agalactiae abx narrowed to ampicillin w/ plan to complete total of 7 days.  12/2 CABG 4  USING LEFT INTERNAL MAMMARY ARTERY AND LEFT ENDOSCOPIC GREATER SAPHENOUS VEIN CONDUITS - diffusely diseased targets but good LV function.   Interim History / Subjective:   Pt. Is intubated and sedated. IABP removed today and minimal chest tube drainage.   Objective   Blood pressure 113/60, pulse 70, temperature 98.1 F (36.7 C), resp. rate (!) 22, height 5\' 6"  (1.676 m), weight 90.1 kg, SpO2 97 %. CVP:  [12 mmHg-20 mmHg] 14 mmHg  Vent Mode: PRVC FiO2 (%):  [40 %] 40 % Set Rate:  [16 bmp-22 bmp] 22 bmp Vt Set:  [470 mL] 470 mL PEEP:  [8 cmH20] 8 cmH20 Plateau Pressure:  [19 cmH20-23 cmH20] 23 cmH20   Intake/Output Summary (Last 24 hours) at 01/19/2021 1052 Last data filed at 01/19/2021 0700 Gross per 24 hour  Intake 5348.84 ml  Output 2750 ml  Net 2598.84 ml    [employer]  01/17/21 0530 01/18/21 0445 01/19/21 0515  Weight: 89.6 kg 87.5 kg 90.1 kg   Net -2.79L for admission  Examination:  General: critically ill appearing woman lying in bed , sedated and intubated, in NAD HENT: ET tube, OG tube in place. Pulm : Symmetric chest excursion.  Vesicular breath sounds throughout. Card: Heart sounds normal, no rub.   Extremities warm and well-perfused Abd: soft, NT, ND, BS diminished, Ext: FemoStop in place.  No bleeding.  Minimal edema Neuro: RASS 0 to -1 , nodding to answer questions, strong cough, following simple commands GU foley with  dilute yellow urine Derm: no rashes, warm & dry, no lesions  Resolved Hospital Problem list     Assessment & Plan:   Critically ill due to expected postoperative respiratory failure require mechanical ventilation with preoperative aspiration pneumonitis vs pneumonia Resolved cardiogenic shock following CABG following non-STEMI. Coronary artery disease status post CABG x4 CAP- Multifocal pneumonia with possible aspiration. Growing Strep agalactiae pneumonia at admission Hx COPD Tobacco use Ileus Acute encephalopathy> resolved. DM2 w/ hyperglycemia- controlled HX HTN HLD HX peptic ulcer  Plan:  -Continue to wean norepinephrine to off. -Start ventilator wean per cardiac surgery protocol mean to time extubation when patient is able to sit up -Post balloon pump removal hemostasis protocol -Continue current sedation strategy -Initiate tramadol once weaning begins -Adequate glycemic control, transition to subcutaneous insulin. -Start chemical prophylaxis for DVT tomorrow - Will need smoking cessation counseling -Secondary prevention: Aspirin and statin.  Best Practice (right click and "Reselect all SmartList Selections" daily)   Diet/type: NPO w/ meds via tube DVT prophylaxis: SCD GI prophylaxis: PPI Lines: Central line and Arterial Line Foley:  Yes, and it is still needed Code Status:  full code Last date of multidisciplinary goals of care discussion [per primary]  CRITICAL CARE Performed by: Kipp Brood   Total critical care time: 40 minutes  Critical care time was exclusive of separately billable procedures and treating other patients.  Critical care was necessary to treat or prevent imminent or life-threatening deterioration.  Critical  care was time spent personally by me on the following activities: development of treatment plan with patient and/or surrogate as well as nursing, discussions with consultants, evaluation of patient's response to treatment, examination of patient, obtaining history from patient or surrogate, ordering and performing treatments and interventions, ordering and review of laboratory studies, ordering and review of radiographic studies, pulse oximetry, re-evaluation of patient's condition and participation in multidisciplinary rounds.  Kipp Brood, MD [facility] ICU Physician [place]  Pager: [phone] Mobile: [phone] After hours: [phone].

## 2021-01-19 NOTE — Op Note (Signed)
NAMEGEORGANA, Morgan Daniels MEDICAL RECORD NO: [id number] ACCOUNT NO: [id number] DATE OF BIRTH: [birth year] FACILITY: [facility] LOCATION: [facility] PHYSICIAN: Revonda Standard. Roxan Hockey, MD  Operative Report   DATE OF PROCEDURE: 01/18/2021  PREOPERATIVE DIAGNOSIS:  Left main and 3-vessel coronary artery disease.  POSTOPERATIVE DIAGNOSIS:  Left main and 3-vessel coronary artery disease.  PROCEDURES PERFORMED:   Median sternotomy, extracorporeal circulation,  Coronary artery bypass grafting x 4  Left internal mammary artery to LAD,  Saphenous vein graft to first diagonal,  Saphenous vein graft to the obtuse marginal, Saphenous vein graft to distal right coronary, Endoscopic vein harvest, right leg.  SURGEON:  Revonda Standard. Roxan Hockey, MD  ASSISTANT:  Enid Cutter, PA  ANESTHESIA:  General.  FINDINGS:  Transesophageal echocardiography showed preserved left ventricular function.  Tiny PFO.  No significant valvular pathology.  Unchanged post-bypass. Good quality conduits.  Diffusely diseased fair quality target vessels.  Distal right coronary- poor quality target.  INDICATIONS:  [name] is a 60 year old woman with multiple cardiac risk factors and known atherosclerotic cardiovascular disease.  She presented with acute respiratory failure and unresponsiveness.  She was found to have left main and 3-vessel  coronary artery disease.  Intraaortic balloon pump was placed and she was transferred to [facility].  She was initially treated for pneumonia in addition to pulmonary edema.  She was extubated at one point, but required reintubation for acute respiratory failure.  Although high risk given her pulmonary status and bypass grafting is indicated, given the severity of her disease.  The indications, risks, benefits, and alternatives were discussed with the patient's family.  They gave permission and agreed for her to proceed.  DESCRIPTION OF PROCEDURE:  The patient was brought to the operating room on  01/18/2021.  She was already intubated and had an intraaortic balloon pump in place.  General anesthesia was induced.  A radial arterial line was placed.  A transesophageal  echocardiography was performed by Dr. Oleta Mouse of Anesthesia.  Please see his separately dictated note for full details, but the findings were as noted above.  The chest, abdomen and legs were prepped and draped in the usual sterile fashion.  A timeout was performed.  A median sternotomy was performed and the left internal mammary artery was harvested using standard technique.  Simultaneously, Enid Cutter, [place] made an incision in the medial aspect of the right leg and harvested the greater  saphenous vein endoscopically from mid calf to groin.  Both the saphenous vein and the mammary artery were good quality vessels.  2000 units of heparin was administered during the vessel harvest.  The remainder of the full heparin dose was given prior  to opening the pericardium.  Sternal retractor was placed and opened over time.  The pericardium was opened.  The ascending aorta was inspected.  There was no significant atherosclerotic disease in the ascending aorta, which correlated with the TEE findings.  After confirming  adequate anticoagulation with ACT measurement the aorta was cannulated via concentric 2-0 Ethibond pledgeted pursestring sutures.  A dual stage venous cannula was placed via a pursestring suture in the right atrial appendage.  Cardiopulmonary bypass was  initiated.  Flows were maintained per protocol.  The patient was cooled to 32 degrees Celsius.  The patient was anemic and the pump was primed with 2 units of packed red blood cells.  The coronary arteries were inspected and anastomotic sites were  chosen.  The conduits were inspected and cut to length.  A foam pad was placed in  the pericardium to insulate the heart.  A temperature probe was placed in the myocardial septum and the cardioplegia cannula was placed in  the ascending aorta.  The aorta was cross clamped.  The left ventricle was emptied via the aortic root vent.  Cardiac arrest was achieved with a combination of cold antegrade and retrograde blood cardioplegia and topical iced saline.  An initial 500 mL of cardioplegia was  administered antegrade.  There was a rapid diastolic arrest.  093 mL was also given retrograde and there was septal cooling to 9 degree Celsius.  A reversed saphenous vein graft then was placed end to side to the distal right coronary.  This was a fair quality vessel at the site of the anastomosis, but was poor quality distally.  A 1.5 mm probe did pass at the site, but only a 1 mm probe passed  into the posterior descending.  At the completion of the graft, the probe passed easily proximally and distally.  Cardioplegia was administered down the graft and both flow and hemostasis were acceptable.  Next, a reversed saphenous vein graft was placed end-to-side to the obtuse marginal 1.  This was diffusely diseased to fair quality target vessel.  It accepted a 1.5 mm probe at the site of the anastomosis.  There was a bifurcation just beyond the  anastomosis with plaque at the site, but a probe did pass into both limbs.  An end-to-side anastomosis was performed with a running 7-0 Prolene suture.  Probe again passed easily proximally and distally and there was good flow and good hemostasis with  cardioplegia administration.  Next, a reversed saphenous vein graft was placed end-to-side to the first diagonal branch of the LAD.  This was a high anterolateral branch.  It was a 1.5 mm vessel.  It was diffusely diseased.  It was a fair quality target.  The vein was of good quality.   It was anastomosed end-to-side with a running 7-0 Prolene suture.  The probe passed easily proximally and distally.  Cardioplegia was administered with good flow and good hemostasis.  Additional cardioplegia was administered via the retrograde cannula.  The left  internal mammary artery was brought through a window in the pericardium.  It was anastomosed end-to-side to the distal LAD.  The distal LAD was a diffusely diseased, fair  quality target vessel, but did accept a 1.5 mm probe.  The mammary was 2 mm, good quality conduit.  The end-to-side anastomosis was performed with a running 8-0 Prolene suture.  At the completion of the anastomosis, the bulldog clamp was briefly removed to  inspect for hemostasis.  Rapid septal rewarming was noted.  The bulldog clamp was replaced and the mammary pedicle was tacked to the epicardial surface of the heart with 6-0 Prolene sutures.  Additional cardioplegia then was administered.  The vein grafts were cut to length.  The cardioplegia cannula was removed from the ascending aorta.  The proximal vein graft anastomoses were performed to 4.5 mm punch aortotomies with running 6-0 Prolene  sutures.  The diagonal vein was not quite long enough to reach the aorta, so it was later placed as a Y graft off the OM vein.  At the completion of the second proximal anastomosis, the patient was placed in Trendelenburg position.  A warm dose of  retrograde cardioplegia was administered.  The bulldog clamp was again removed from the left mammary artery.  Lidocaine was administered.  The aortic root was de-aired and the aortic crossclamp was removed.  The total crossclamp time was 75 minutes.   The patient spontaneously resumed sinus rhythm and did not require defibrillation.  Bulldog clamps were placed on the OM vein and a longitudinal venotomy was made.  The proximal anastomosis for the diagonal vein was sewn to the OM vein end-to-side with a running 7-0 Prolene suture.  The anastomosis was de-aired prior to reestablishing flow.  While rewarming was completed all proximal and distal anastomoses were inspected for hemostasis.  Epicardial pacing wires were placed on the right ventricle and right atrium.  When the patient had rewarmed to a core  temperature of 37 degrees Celsius she  was weaned from cardiopulmonary bypass on the first attempt.  The total bypass time was 113 minutes.  The patient did not have a Swan-Ganz catheter in place due to her latex allergy, but estimation of cardiac output via the transesophageal echocardiogram  was excellent.  The patient remained hemodynamically stable throughout the post-bypass period.  The intraaortic balloon pump was on at 1:1.  A test dose protamine was administered.  The atrial and aortic cannula were removed.  There was some bleeding from the aortic cannulation site requiring additional 4-0 Prolene pledgeted sutures.  There then was good hemostasis.  The chest was copiously  irrigated with warm saline.  Hemostasis was achieved.  Left pleural and mediastinal chest tubes were placed through separate subcostal incisions.  The sternum was closed with a combination of single and double heavy gauge stainless steel wires.  The  pectoralis fascia, subcutaneous tissue and skin were closed in standard fashion.  All sponge, needle and instrument counts were correct at the end of the procedure.    Experienced assistance was needed with this case due to its complexity.  Myron Roddenberry, PAf harvested the vein and closed the leg incisions, as well as  exposure, retraction, and suctioning during the distal anastomoses.    PUS D: 01/18/2021 6:26:04 pm T: 01/19/2021 4:34:00 am  JOB: [id number]/ [id number]

## 2021-01-19 NOTE — Progress Notes (Signed)
[place] Progress Note Patient Name: Morgan Daniels DOB: February 23, 1960 MRN: [id number]   Date of Service  01/19/2021  HPI/Events of Note  ABG resulted.  eICU Interventions  ABG reviewed. No changes.        Kerry Kass Unice Vantassel 01/19/2021, 4:02 AM

## 2021-01-20 ENCOUNTER — Inpatient Hospital Stay (HOSPITAL_COMMUNITY): Payer: Medicare Other

## 2021-01-20 DIAGNOSIS — I214 Non-ST elevation (NSTEMI) myocardial infarction: Secondary | ICD-10-CM | POA: Diagnosis not present

## 2021-01-20 DIAGNOSIS — J810 Acute pulmonary edema: Secondary | ICD-10-CM | POA: Diagnosis not present

## 2021-01-20 DIAGNOSIS — R570 Cardiogenic shock: Secondary | ICD-10-CM | POA: Diagnosis not present

## 2021-01-20 DIAGNOSIS — J690 Pneumonitis due to inhalation of food and vomit: Secondary | ICD-10-CM | POA: Diagnosis not present

## 2021-01-20 LAB — BASIC METABOLIC PANEL
Anion gap: 12 (ref 5–15)
BUN: 49 mg/dL — ABNORMAL HIGH (ref 6–20)
CO2: 25 mmol/L (ref 22–32)
Calcium: 8.3 mg/dL — ABNORMAL LOW (ref 8.9–10.3)
Chloride: 104 mmol/L (ref 98–111)
Creatinine, Ser: 1.11 mg/dL — ABNORMAL HIGH (ref 0.44–1.00)
GFR, Estimated: 57 mL/min — ABNORMAL LOW (ref >60)
Glucose, Bld: 196 mg/dL — ABNORMAL HIGH (ref 70–99)
Potassium: 3.9 mmol/L (ref 3.5–5.1)
Sodium: 141 mmol/L (ref 135–145)

## 2021-01-20 LAB — TYPE AND SCREEN
ABO/RH(D): A NEG
Antibody Screen: NEGATIVE
Unit division: 0
Unit division: 0
Unit division: 0
Unit division: 0
Unit division: 0
Unit division: 0

## 2021-01-20 LAB — BPAM RBC
Blood Product Expiration Date: 202212292359
Blood Product Expiration Date: 202212292359
Blood Product Expiration Date: 202212302359
Blood Product Expiration Date: 202212302359
Blood Product Expiration Date: 202212302359
Blood Product Expiration Date: 202212312359
ISSUE DATE / TIME: 202212021239
ISSUE DATE / TIME: 202212021239
ISSUE DATE / TIME: 202212021239
ISSUE DATE / TIME: 202212021239
Unit Type and Rh: 600
Unit Type and Rh: 600
Unit Type and Rh: 600
Unit Type and Rh: 600
Unit Type and Rh: 600
Unit Type and Rh: 600

## 2021-01-20 LAB — CBC
HCT: 28.8 % — ABNORMAL LOW (ref 36.0–46.0)
Hemoglobin: 9.4 g/dL — ABNORMAL LOW (ref 12.0–15.0)
MCH: 27.4 pg (ref 26.0–34.0)
MCHC: 32.6 g/dL (ref 30.0–36.0)
MCV: 84 fL (ref 80.0–100.0)
Platelets: 162 10*3/uL (ref 150–400)
RBC: 3.43 MIL/uL — ABNORMAL LOW (ref 3.87–5.11)
RDW: 13.8 % (ref 11.5–15.5)
WBC: 13.7 10*3/uL — ABNORMAL HIGH (ref 4.0–10.5)
nRBC: 0.1 % (ref 0.0–0.2)

## 2021-01-20 LAB — POCT I-STAT 7, (LYTES, BLD GAS, ICA,H+H)
Acid-Base Excess: 3 mmol/L — ABNORMAL HIGH (ref 0.0–2.0)
Bicarbonate: 26.9 mmol/L (ref 20.0–28.0)
Calcium, Ion: 1.16 mmol/L (ref 1.15–1.40)
HCT: 26 % — ABNORMAL LOW (ref 36.0–46.0)
Hemoglobin: 8.8 g/dL — ABNORMAL LOW (ref 12.0–15.0)
O2 Saturation: 90 %
Patient temperature: 99.2
Potassium: 4 mmol/L (ref 3.5–5.1)
Sodium: 141 mmol/L (ref 135–145)
TCO2: 28 mmol/L (ref 22–32)
pCO2 arterial: 37.7 mmHg (ref 32.0–48.0)
pH, Arterial: 7.463 — ABNORMAL HIGH (ref 7.350–7.450)
pO2, Arterial: 56 mmHg — ABNORMAL LOW (ref 83.0–108.0)

## 2021-01-20 LAB — GLUCOSE, CAPILLARY
Glucose-Capillary: 189 mg/dL — ABNORMAL HIGH (ref 70–99)
Glucose-Capillary: 205 mg/dL — ABNORMAL HIGH (ref 70–99)
Glucose-Capillary: 166 mg/dL — ABNORMAL HIGH (ref 70–99)
Glucose-Capillary: 159 mg/dL — ABNORMAL HIGH (ref 70–99)
Glucose-Capillary: 199 mg/dL — ABNORMAL HIGH (ref 70–99)
Glucose-Capillary: 216 mg/dL — ABNORMAL HIGH (ref 70–99)

## 2021-01-20 LAB — COOXEMETRY PANEL
Carboxyhemoglobin: 1 % (ref 0.5–1.5)
Methemoglobin: 1 % (ref 0.0–1.5)
O2 Saturation: 66.4 %
Total hemoglobin: 9.2 g/dL — ABNORMAL LOW (ref 12.0–16.0)

## 2021-01-20 MED ORDER — DEXAMETHASONE SODIUM PHOSPHATE 10 MG/ML IJ SOLN
10.0000 mg | Freq: Once | INTRAMUSCULAR | Status: AC
  Administered 2021-01-20: 10 mg via INTRAVENOUS
  Filled 2021-01-20: qty 1

## 2021-01-20 MED ORDER — OXYCODONE HCL 5 MG PO TABS
5.0000 mg | ORAL_TABLET | ORAL | Status: DC | PRN
  Administered 2021-01-21 – 2021-01-25 (×11): 10 mg
  Filled 2021-01-20 (×11): qty 2

## 2021-01-20 MED ORDER — RACEPINEPHRINE HCL 2.25 % IN NEBU
INHALATION_SOLUTION | RESPIRATORY_TRACT | Status: AC
  Administered 2021-01-20: 6 mg
  Filled 2021-01-20: qty 0.5

## 2021-01-20 MED ORDER — BUDESONIDE 0.25 MG/2ML IN SUSP
0.2500 mg | Freq: Two times a day (BID) | RESPIRATORY_TRACT | Status: DC
Start: 2021-01-20 — End: 2021-01-21
  Administered 2021-01-20 – 2021-01-21 (×3): 0.25 mg via RESPIRATORY_TRACT
  Filled 2021-01-20 (×3): qty 2

## 2021-01-20 MED ORDER — REVEFENACIN 175 MCG/3ML IN SOLN
175.0000 ug | Freq: Every day | RESPIRATORY_TRACT | Status: DC
Start: 2021-01-20 — End: 2021-01-22
  Administered 2021-01-21 – 2021-01-22 (×2): 175 ug via RESPIRATORY_TRACT
  Filled 2021-01-20 (×4): qty 3

## 2021-01-20 MED ORDER — FUROSEMIDE 10 MG/ML IJ SOLN
40.0000 mg | Freq: Once | INTRAMUSCULAR | Status: AC
  Administered 2021-01-20: 40 mg via INTRAVENOUS
  Filled 2021-01-20: qty 4

## 2021-01-20 MED ORDER — INSULIN ASPART 100 UNIT/ML IJ SOLN
0.0000 [IU] | Freq: Three times a day (TID) | INTRAMUSCULAR | Status: DC
  Administered 2021-01-21: 4 [IU] via SUBCUTANEOUS
  Administered 2021-01-21: 8 [IU] via SUBCUTANEOUS
  Administered 2021-01-21: 4 [IU] via SUBCUTANEOUS
  Administered 2021-01-22: 8 [IU] via SUBCUTANEOUS
  Administered 2021-01-22: 12 [IU] via SUBCUTANEOUS
  Administered 2021-01-22 – 2021-01-23 (×3): 4 [IU] via SUBCUTANEOUS
  Administered 2021-01-23 – 2021-01-24 (×3): 2 [IU] via SUBCUTANEOUS
  Administered 2021-01-24: 4 [IU] via SUBCUTANEOUS
  Administered 2021-01-25 (×2): 2 [IU] via SUBCUTANEOUS

## 2021-01-20 MED ORDER — DOCUSATE SODIUM 50 MG/5ML PO LIQD
200.0000 mg | Freq: Every day | ORAL | Status: DC
Start: 2021-01-20 — End: 2021-01-20
  Administered 2021-01-20: 200 mg
  Filled 2021-01-20: qty 20

## 2021-01-20 MED ORDER — TRAMADOL HCL 50 MG PO TABS
50.0000 mg | ORAL_TABLET | ORAL | Status: DC | PRN
Start: 2021-01-20 — End: 2021-01-23
  Administered 2021-01-21: 100 mg
  Filled 2021-01-20: qty 2

## 2021-01-20 MED ORDER — DOCUSATE SODIUM 50 MG/5ML PO LIQD: 200.0000 mg | Freq: Every day | ORAL | Status: DC | PRN

## 2021-01-20 MED ORDER — AMIODARONE HCL 200 MG PO TABS
400.0000 mg | ORAL_TABLET | Freq: Every day | ORAL | Status: DC
  Administered 2021-01-20 – 2021-01-25 (×6): 400 mg via NASOGASTRIC
  Filled 2021-01-20 (×6): qty 2

## 2021-01-20 MED ORDER — PANTOPRAZOLE 2 MG/ML SUSPENSION
40.0000 mg | Freq: Every day | ORAL | Status: DC
  Administered 2021-01-20 – 2021-01-23 (×4): 40 mg
  Filled 2021-01-20 (×6): qty 20

## 2021-01-20 NOTE — Progress Notes (Signed)
EVENING ROUNDS NOTE :     [address]       [place] [address]             [phone]                 2 Days Post-Op Procedure(s) (LRB): CORONARY ARTERY BYPASS GRAFTING (CABG) x 4  USING LEFT INTERNAL MAMMARY ARTERY AND LEFT ENDOSCOPIC GREATER SAPHENOUS VEIN CONDUITS (N/A) TRANSESOPHAGEAL ECHOCARDIOGRAM (TEE) (N/A) APPLICATION OF CELL SAVER ENDOVEIN HARVEST OF GREATER SAPHENOUS VEIN (Left)   Total Length of Stay:  LOS: 7 days  Events:   Self extubated this afternoon. On HFNC    BP (!) 146/76 (BP Location: Left Arm)   Pulse 74   Temp 98.4 F (36.9 C) (Oral)   Resp 16   Ht 5\' 6"  (1.676 m)   Wt 93 kg   LMP  (LMP Unknown) Comment: pt intubated  SpO2 100%   BMI 33.09 kg/m   CVP:  [7 mmHg-27 mmHg] 17 mmHg     sodium chloride 20 mL/hr at 01/18/21 1938   sodium chloride     sodium chloride Stopped (01/18/21 1715)   dexmedetomidine (PRECEDEX) IV infusion Stopped (01/20/21 1452)   lactated ringers     lactated ringers     lactated ringers 20 mL/hr at 01/20/21 1200   piperacillin-tazobactam (ZOSYN)  IV 3.375 g (01/20/21 1714)    I/O last 3 completed shifts: In: 3706.1 [I.V.:2112.9; Other:60; NG/GT:360; IV Piggyback:1173.2] Out: 2290 [Urine:1720; Emesis/NG output:400; Chest Tube:170]   CBC Latest Ref Rng & Units 01/20/2021 01/20/2021 01/19/2021  WBC 4.0 - 10.5 K/uL - 13.7(H) 13.3(H)  Hemoglobin 12.0 - 15.0 g/dL 8.8(L) 9.4(L) 9.3(L)  Hematocrit 36.0 - 46.0 % 26.0(L) 28.8(L) 28.2(L)  Platelets 150 - 400 K/uL - 162 120(L)    BMP Latest Ref Rng & Units 01/20/2021 01/20/2021 01/19/2021  Glucose 70 - 99 mg/dL - 196(H) 220(H)  BUN 6 - 20 mg/dL - 49(H) 47(H)  Creatinine 0.44 - 1.00 mg/dL - 1.11(H) 1.34(H)  Sodium 135 - 145 mmol/L 141 141 139  Potassium 3.5 - 5.1 mmol/L 4.0 3.9 4.4  Chloride 98 - 111 mmol/L - 104 105  CO2 22 - 32 mmol/L - 25 25  Calcium 8.9 - 10.3 mg/dL - 8.3(L) 8.3(L)    ABG    Component Value Date/Time   PHART 7.463 (H) 01/20/2021  0420   PCO2ART 37.7 01/20/2021 0420   PO2ART 56 (L) 01/20/2021 0420   HCO3 26.9 01/20/2021 0420   TCO2 28 01/20/2021 0420   ACIDBASEDEF 9.6 (H) 01/12/2021 2122   O2SAT 66.4 01/20/2021 0430       Melodie Bouillon, MD 01/20/2021 5:30 PM

## 2021-01-20 NOTE — Progress Notes (Signed)
NAME:  Morgan Daniels, MRN:  [id number], DOB:  [birth year], LOS: 7 ADMISSION DATE:  01/13/2021, CONSULTATION DATE:  11/28 REFERRING MD:  Marcelle Smiling, REASON FOR CONSULT:  ventilator assistance   History of Present Illness:  Patient is encephalopathic and/or intubated. Therefore history has been obtained from chart review.  Morgan Daniels is a 60 y.o. female who is seen in consultation at the request of Dr. Marcelle Smiling for recommendations on further evaluation and management of ventilator assistance.   They have a pertnent past medical history of PAD, HTN, HLD, current tobacco abuse, COPD, DM2  Morgan Daniels is an 60 y.o. who presented to the [facility] ED on 01/12/2021 via EMS due to acute respiratory failure and unresponsiveness.  Per ED documentation and EMS report the patient began having difficulty breathing and told her husband that she felt as if she was going to die.  Her husband placed her on his home CPAP machine while calling EMS.  Upon EMSs arrival she was agonal he breathing and significantly hypoxic, they transitioned her to a [facility] airway for transport. The patient never lost pulses. She was intubated in the ED. The patient did not meet STEMI criteria. She was  worked up for PE VS PNU. Cefepime and vancomycin was given. CTA negative for PE, CTH negative. The patient was extubated 11/27. The patient later developed respiratory distress, was reintubated, CODE STEMI was called. In cath lab they were found to have a 100% occluded RCA. A IABP was placed. Transferred to [facility] to be managed by cardiology.She is scheduled for CABG 21/03/2020  PCCM was consulted for ventilatory assistance   Pertinent  Medical History  PAD, HTN, HLD, current tobacco abuse, COPD, DM2, CVA, peptic ulcer,   Significant Hospital Events: Including procedures, antibiotic start and stop dates in addition to other pertinent events   01/12/2021: Patient admitted to [facility] ICU with acute hypoxic respiratory failure requiring emergent  intubation and mechanical ventilatory support secondary to pneumonia versus acute heart failure.  CTA negative for PE, CTh negative. 01/13/21: Patient extubated during the day, ECHO: LVEF 60-65%, no wall motion abnormalities, G1DD. cardiology planned for scheduled LHC. Patient then developed respiratory distress and was emergently re-intubated CODE STEMI called patient taken to cath lab, RCA 100% occluded > IABP placed emergently and patient transferred to [facility] emergently. 11/28 TXR to [facility]. Cardiology primary. PCCM consulted 11/30 responded well to diuresis. EEG done 11/29 no seizure activity. Passed her SBT. Extubated. Diuretics continued. Still on IABP. Cultures from [facility] grew strep agalactiae abx narrowed to ampicillin w/ plan to complete total of 7 days.  12/2 CABG 4  USING LEFT INTERNAL MAMMARY ARTERY AND LEFT ENDOSCOPIC GREATER SAPHENOUS VEIN CONDUITS - diffusely diseased targets but good LV function.   Interim History / Subjective:   Following commands on minimal sedation.   Objective   Blood pressure (!) 109/56, pulse 70, temperature 98.6 F (37 C), temperature source Oral, resp. rate 18, height 5\' 6"  (1.676 m), weight 93 kg, SpO2 98 %. CVP:  [11 mmHg-20 mmHg] 18 mmHg  Vent Mode: PSV;CPAP FiO2 (%):  [40 %] 40 % Set Rate:  [22 bmp] 22 bmp Vt Set:  [470 mL] 470 mL PEEP:  [5 cmH20] 5 cmH20 Pressure Support:  [12 cmH20] 12 cmH20 Plateau Pressure:  [17 cmH20-19 cmH20] 19 cmH20   Intake/Output Summary (Last 24 hours) at 01/20/2021 0950 Last data filed at 01/20/2021 0700 Gross per 24 hour  Intake 1605.02 ml  Output 1230 ml  Net 375.02 ml    Danley Danker  Weights   01/18/21 0445 01/19/21 0515 01/20/21 0645  Weight: 87.5 kg 90.1 kg 93 kg   Net -2.79L for admission  Examination:  General: critically ill appearing woman lying in bed , sedated and intubated, in NAD HENT: ET tube, OG tube in place. Pulm : Symmetric chest excursion.  Vesicular breath sounds throughout. Card: Heart sounds  normal, no rub.  Extremities warm and well-perfused Abd: soft, NT, ND, BS diminished, Ext: FemoStop in place.  No bleeding.  Minimal edema Neuro: RASS 0 to -1 , nodding to answer questions, strong cough, following simple commands GU foley with  dilute yellow urine Derm: no rashes, warm & dry, no lesions  Resolved Hospital Problem list     Assessment & Plan:   Critically ill due to expected postoperative respiratory failure require mechanical ventilation with preoperative aspiration pneumonitis vs pneumonia Resolved cardiogenic shock following CABG following non-STEMI. Coronary artery disease status post CABG x4 CAP- Multifocal pneumonia with possible aspiration. Growing Strep agalactiae pneumonia at admission Hx COPD Tobacco use Ileus Acute encephalopathy> resolved. DM2 w/ hyperglycemia- controlled HX HTN HLD HX peptic ulcer  Plan:  -SBT and extubate today if possible.  - Diuresis - Start bronchodilators - Initiate tramadol once weaning begins -Adequate glycemic control, transition to subcutaneous insulin. -Start chemical prophylaxis for DVT tomorrow - Will need smoking cessation counseling -Secondary prevention: Aspirin and statin.    Best Practice (right click and "Reselect all SmartList Selections" daily)   Diet/type: NPO w/ meds via tube DVT prophylaxis: SCD GI prophylaxis: PPI Lines: Central line and Arterial Line Foley:  Yes, and it is still needed Code Status:  full code Last date of multidisciplinary goals of care discussion [per primary]  CRITICAL CARE Performed by: Kipp Brood   Total critical care time: 40 minutes  Critical care time was exclusive of separately billable procedures and treating other patients.  Critical care was necessary to treat or prevent imminent or life-threatening deterioration.  Critical care was time spent personally by me on the following activities: development of treatment plan with patient and/or surrogate as well as  nursing, discussions with consultants, evaluation of patient's response to treatment, examination of patient, obtaining history from patient or surrogate, ordering and performing treatments and interventions, ordering and review of laboratory studies, ordering and review of radiographic studies, pulse oximetry, re-evaluation of patient's condition and participation in multidisciplinary rounds.  Kipp Brood, MD [facility] ICU Physician [place]  Pager: [phone] Mobile: [phone] After hours: [phone].

## 2021-01-20 NOTE — Progress Notes (Signed)
Pt coughs each time she attempts to eat and ice chip.  Pt fails nursing bedside swallow.

## 2021-01-20 NOTE — Progress Notes (Signed)
Pt self extubated at 1425 and placed on NRB by RN until RT arrived to room. WOB WNL and tolerating well without invasive support on 6L HFNC. Pt is able to cough with moderate force and it appears pt is in no acute distress at this time. CCM called and notified of self extubation.

## 2021-01-20 NOTE — Progress Notes (Signed)
[address]       [place] [address]             [phone]                 2 Days Post-Op Procedure(s) (LRB): CORONARY ARTERY BYPASS GRAFTING (CABG) x 4  USING LEFT INTERNAL MAMMARY ARTERY AND LEFT ENDOSCOPIC GREATER SAPHENOUS VEIN CONDUITS (N/A) TRANSESOPHAGEAL ECHOCARDIOGRAM (TEE) (N/A) APPLICATION OF CELL SAVER ENDOVEIN HARVEST OF GREATER SAPHENOUS VEIN (Left)   Events: No events overnight Tachypnic with  _______________________________________________________________ Vitals: BP (!) 109/56 (BP Location: Left Arm)   Pulse 70   Temp 98.6 F (37 C) (Oral)   Resp (!) 25   Ht 5\' 6"  (1.676 m)   Wt 93 kg   LMP  (LMP Unknown) Comment: pt intubated  SpO2 94%   BMI 33.09 kg/m  Filed Weights   01/18/21 0445 01/19/21 0515 01/20/21 0645  Weight: 87.5 kg 90.1 kg 93 kg     - Neuro: eyes open to voice.  Not following commands.    - Cardiovascular: sinus  Drips: amio 30  CVP:  [11 mmHg-20 mmHg] 20 mmHg   - Pulm:  Vent Mode: PSV;CPAP FiO2 (%):  [40 %] 40 % Set Rate:  [22 bmp] 22 bmp Vt Set:  [470 mL] 470 mL PEEP:  [5 cmH20] 5 cmH20 Pressure Support:  [5 cmH20-15 cmH20] 15 cmH20 Plateau Pressure:  [17 cmH20-19 cmH20] 19 cmH20  ABG    Component Value Date/Time   PHART 7.463 (H) 01/20/2021 0420   PCO2ART 37.7 01/20/2021 0420   PO2ART 56 (L) 01/20/2021 0420   HCO3 26.9 01/20/2021 0420   TCO2 28 01/20/2021 0420   ACIDBASEDEF 9.6 (H) 01/12/2021 2122   O2SAT 66.4 01/20/2021 0430    - Abd: ND - Extremity: cool  .Intake/Output      12/03 0701 12/04 0700 12/04 0701 12/05 0700   I.V. (mL/kg) 1275.2 (13.7) 96.6 (1)   Blood     Other     NG/GT 180    IV Piggyback 149.8    Total Intake(mL/kg) 1605 (17.3) 96.6 (1)   Urine (mL/kg/hr) 1100 (0.5) 60 (0.2)   Emesis/NG output 150 60   Other     Stool     Blood     Chest Tube 100 20   Total Output 1350 140   Net +255 -43.5            _______________________________________________________________ Labs: CBC Latest Ref Rng & Units 01/20/2021 01/20/2021 01/19/2021  WBC 4.0 - 10.5 K/uL - 13.7(H) 13.3(H)  Hemoglobin 12.0 - 15.0 g/dL 8.8(L) 9.4(L) 9.3(L)  Hematocrit 36.0 - 46.0 % 26.0(L) 28.8(L) 28.2(L)  Platelets 150 - 400 K/uL - 162 120(L)   CMP Latest Ref Rng & Units 01/20/2021 01/20/2021 01/19/2021  Glucose 70 - 99 mg/dL - 196(H) 220(H)  BUN 6 - 20 mg/dL - 49(H) 47(H)  Creatinine 0.44 - 1.00 mg/dL - 1.11(H) 1.34(H)  Sodium 135 - 145 mmol/L 141 141 139  Potassium 3.5 - 5.1 mmol/L 4.0 3.9 4.4  Chloride 98 - 111 mmol/L - 104 105  CO2 22 - 32 mmol/L - 25 25  Calcium 8.9 - 10.3 mg/dL - 8.3(L) 8.3(L)  Total Protein 6.5 - 8.1 g/dL - - -  Total Bilirubin 0.3 - 1.2 mg/dL - - -  Alkaline Phos 38 - 126 U/L - - -  AST 15 - 41 U/L - - -  ALT 0 - 44 U/L - - -  CXR: PV congestion  _______________________________________________________________  Assessment and Plan: POD 2 s/p CABG  Neuro: wean sedation.   CV: IABP out.  On amio gtt, will convert to PO.  Pulm: wean vent as tolerated.  Small air leak with cough.  Will keep tubes for now Renal: creat down.  Continue diuresis GI: NPO for now Heme: stable ID: [email or web address] Endo: SSI today Dispo: continue ICU care   [place] 01/20/2021 10:24 AM

## 2021-01-21 ENCOUNTER — Encounter: Payer: Self-pay | Admitting: Certified Registered Nurse Anesthetist

## 2021-01-21 ENCOUNTER — Encounter (HOSPITAL_COMMUNITY): Payer: Self-pay | Admitting: Thoracic Surgery (Cardiothoracic Vascular Surgery)

## 2021-01-21 ENCOUNTER — Inpatient Hospital Stay (HOSPITAL_COMMUNITY): Payer: Medicare Other

## 2021-01-21 DIAGNOSIS — J9601 Acute respiratory failure with hypoxia: Secondary | ICD-10-CM | POA: Diagnosis not present

## 2021-01-21 DIAGNOSIS — J810 Acute pulmonary edema: Secondary | ICD-10-CM | POA: Diagnosis not present

## 2021-01-21 DIAGNOSIS — J189 Pneumonia, unspecified organism: Secondary | ICD-10-CM | POA: Diagnosis not present

## 2021-01-21 DIAGNOSIS — R570 Cardiogenic shock: Secondary | ICD-10-CM | POA: Diagnosis not present

## 2021-01-21 LAB — GLUCOSE, CAPILLARY
Glucose-Capillary: 177 mg/dL — ABNORMAL HIGH (ref 70–99)
Glucose-Capillary: 189 mg/dL — ABNORMAL HIGH (ref 70–99)
Glucose-Capillary: 207 mg/dL — ABNORMAL HIGH (ref 70–99)
Glucose-Capillary: 210 mg/dL — ABNORMAL HIGH (ref 70–99)
Glucose-Capillary: 232 mg/dL — ABNORMAL HIGH (ref 70–99)
Glucose-Capillary: 254 mg/dL — ABNORMAL HIGH (ref 70–99)

## 2021-01-21 LAB — COOXEMETRY PANEL
Carboxyhemoglobin: 1.3 % (ref 0.5–1.5)
Methemoglobin: 1 % (ref 0.0–1.5)
O2 Saturation: 73.6 %
Total hemoglobin: 9.1 g/dL — ABNORMAL LOW (ref 12.0–16.0)

## 2021-01-21 LAB — PHOSPHORUS: Phosphorus: 2.8 mg/dL (ref 2.5–4.6)

## 2021-01-21 LAB — MAGNESIUM: Magnesium: 2.2 mg/dL (ref 1.7–2.4)

## 2021-01-21 MED ORDER — FUROSEMIDE 10 MG/ML IJ SOLN
40.0000 mg | Freq: Once | INTRAMUSCULAR | Status: AC
  Administered 2021-01-21: 40 mg via INTRAVENOUS
  Filled 2021-01-21: qty 4

## 2021-01-21 MED ORDER — VITAL 1.5 CAL PO LIQD
1000.0000 mL | ORAL | Status: DC
  Administered 2021-01-25: 1000 mL
  Filled 2021-01-21 (×3): qty 1000

## 2021-01-21 MED ORDER — METOCLOPRAMIDE HCL 5 MG/5ML PO SOLN
10.0000 mg | Freq: Three times a day (TID) | ORAL | Status: AC
  Administered 2021-01-21 – 2021-01-22 (×5): 10 mg
  Filled 2021-01-21 (×5): qty 10

## 2021-01-21 MED ORDER — METOPROLOL TARTRATE 25 MG PO TABS
25.0000 mg | ORAL_TABLET | Freq: Two times a day (BID) | ORAL | Status: DC
  Administered 2021-01-21: 25 mg via ORAL
  Filled 2021-01-21: qty 1

## 2021-01-21 MED ORDER — SIMETHICONE 40 MG/0.6ML PO SUSP
40.0000 mg | Freq: Four times a day (QID) | ORAL | Status: DC | PRN
  Administered 2021-01-21 – 2021-01-22 (×2): 40 mg
  Filled 2021-01-21 (×3): qty 0.6

## 2021-01-21 MED ORDER — ASPIRIN 81 MG PO CHEW
81.0000 mg | CHEWABLE_TABLET | Freq: Every day | ORAL | Status: DC
  Administered 2021-01-22 – 2021-01-25 (×4): 81 mg
  Filled 2021-01-21 (×4): qty 1

## 2021-01-21 MED ORDER — METOPROLOL TARTRATE 25 MG PO TABS
25.0000 mg | ORAL_TABLET | Freq: Two times a day (BID) | ORAL | Status: DC
  Administered 2021-01-21 – 2021-01-25 (×8): 25 mg
  Filled 2021-01-21 (×8): qty 1

## 2021-01-21 MED ORDER — CLOPIDOGREL BISULFATE 75 MG PO TABS
75.0000 mg | ORAL_TABLET | Freq: Every day | ORAL | Status: DC
  Administered 2021-01-21 – 2021-01-25 (×5): 75 mg
  Filled 2021-01-21 (×5): qty 1

## 2021-01-21 MED ORDER — PROSOURCE TF PO LIQD
45.0000 mL | Freq: Two times a day (BID) | ORAL | Status: DC
  Administered 2021-01-21 – 2021-01-23 (×6): 45 mL
  Filled 2021-01-21 (×7): qty 45

## 2021-01-21 MED ORDER — INSULIN ASPART 100 UNIT/ML IJ SOLN: 0.0000 [IU] | INTRAMUSCULAR | Status: DC

## 2021-01-21 MED ORDER — INSULIN ASPART 100 UNIT/ML IJ SOLN
2.0000 [IU] | INTRAMUSCULAR | Status: DC
  Administered 2021-01-21 – 2021-01-22 (×3): 2 [IU] via SUBCUTANEOUS

## 2021-01-21 MED ORDER — INSULIN GLARGINE-YFGN 100 UNIT/ML ~~LOC~~ SOLN
5.0000 [IU] | Freq: Every day | SUBCUTANEOUS | Status: DC
  Administered 2021-01-21: 5 [IU] via SUBCUTANEOUS
  Filled 2021-01-21 (×3): qty 0.05

## 2021-01-21 NOTE — Progress Notes (Signed)
Nutrition Follow-up  DOCUMENTATION CODES:   Not applicable  INTERVENTION:   Plan for Cortrak placement today  Tube feeding via Cortrak:  Vital 1.5 at 50 ml/hr Pro-Source TF 45 ML BID Provides 103 g of protein, 1880 kcals and 912 mL of free water   NUTRITION DIAGNOSIS:   Inadequate oral intake related to acute illness as evidenced by NPO status.  Being addressed via TF   GOAL:   Patient will meet greater than or equal to 90% of their needs  Progressing  MONITOR:   Vent status, Labs, TF tolerance, Weight trends  REASON FOR ASSESSMENT:   Ventilator, Consult Enteral/tube feeding initiation and management  ASSESSMENT:   60 yo female admitted with acute respiratory failure secondary to acute pulmonary edema with acute decompensated heart failure, NSTEMI, cardiogenic shock with IABP. PMH includes HTN, DM, HLD, hx peptic ulcer, COPD, CAD  11/27 IABP placed, Intubated 11/30 Extubated-Re-Intubated post aspiration even from vomiting 12/02 CABG x 4 12/03 IABP removed 12/04 Self-Extubated  Currently on HFNC NPO, SLP following, plan for cortrak today  Current wt 93 kg; admit weight 86.8 kg. Net negative 3 L per i/O flow sheet Chest tube with 110 mL in 24 hours  Labs: CBGs 114-207, Creatinine 1.11, BUN 49 Meds: ss novolog    Diet Order:   Diet Order             Diet NPO time specified Except for: Ice Chips  Diet effective now                   EDUCATION NEEDS:   Not appropriate for education at this time  Skin:  Skin Assessment: Reviewed RN Assessment  Last BM:  12/04  Height:   Ht Readings from Last 1 Encounters:  01/17/21 5\' 6"  (1.676 m)    Weight:   Wt Readings from Last 1 Encounters:  01/20/21 93 kg    Ideal Body Weight:  59 kg  BMI:  Body mass index is 33.09 kg/m.  Estimated Nutritional Needs:   Kcal:  1800-2000 kcals  Protein:  90-110 g  Fluid:  >/= 1.5 L   Kerman Passey MS, RDN, LDN, CNSC Registered Dietitian  III Clinical Nutrition RD Pager and On-Call Pager Number Located in [place]

## 2021-01-21 NOTE — Discharge Instructions (Signed)

## 2021-01-21 NOTE — Evaluation (Signed)
Clinical/Bedside Swallow Evaluation Patient Details  Name: Haizlee Henton MRN: [id number] Date of Birth: 03/03/1960  Today's Date: 01/21/2021 Time: SLP Start Time (ACUTE ONLY): 5885 SLP Stop Time (ACUTE ONLY): 0277 SLP Time Calculation (min) (ACUTE ONLY): 20 min  Past Medical History:  Past Medical History:  Diagnosis Date   Anxiety    Bilateral carotid artery stenosis 2014   Chronic kidney disease 2017   Current smoker    CVA (cerebral vascular accident) ([place]) 2013   Depression    Diverticulosis    Fatty liver    Fibromyalgia    GERD (gastroesophageal reflux disease)    Hiatal hernia    Hypercholesteremia    Hypertension    IBS (irritable bowel syndrome)    PAD (peripheral artery disease) ([facility])    Peptic ulcer    T2DM (type 2 diabetes mellitus) ([place])    Past Surgical History:  Past Surgical History:  Procedure Laterality Date   CORONARY/GRAFT ACUTE MI REVASCULARIZATION N/A 01/13/2021   Procedure: Coronary/Graft Acute MI Revascularization;  Surgeon: Isaias Cowman, MD;  Location: [facility];  Service: Cardiovascular;  Laterality: N/A;   IABP INSERTION Right 01/13/2021   Procedure: IABP Insertion;  Surgeon: Isaias Cowman, MD;  Location: [facility];  Service: Cardiovascular;  Laterality: Right;   LEFT HEART CATH AND CORONARY ANGIOGRAPHY N/A 01/13/2021   Procedure: LEFT HEART CATH AND CORONARY ANGIOGRAPHY;  Surgeon: Isaias Cowman, MD;  Location: [place] [facility];  Service: Cardiovascular;  Laterality: N/A;   HPI:  Pt is a 60 yo female who presented to the [facility] ED on 01/12/2021 via EMS due to acute respiratory failure and unresponsiveness. ETT 11/26-11/27; reintubated 11/27 with CODE STEMI and extubated 11/30; developed stridor and vomiting with concern for aspiration so reintubated 11/30 until self-extubated 12/4. Pt s/p CABG x4 on 12/2. PMH includes: PAD, HTN, HLD, current tobacco abuse, COPD, DM2, CVA, GERD, hiatal hernia, anxiety     Assessment / Plan / Recommendation  Clinical Impression  Pt has signs of a post-extubation dysphagia s/p repeated intubations with self-extubation on previous date. Her voice is dysphonic although subjectively improved per pt and RN report. She has intermittent throat clearing with ice chips but more consistent, subtle coughing with sips of water. Multiple subswallows are observed especially with purees, which she swallows with what she reports to be increased effort. Recommend that she be made NPO except for a few pieces of ice, given after oral care and one at a time. Anticipate that she may be ready for an instrumental swallow study within the next few days. SLP Visit Diagnosis: Dysphagia, unspecified (R13.10)    Aspiration Risk  Moderate aspiration risk    Diet Recommendation NPO;Ice chips PRN after oral care (limited ice chips, given one at a time after oral care is performed)   Medication Administration: Via alternative means    Other  Recommendations Oral Care Recommendations: Oral care QID Other Recommendations: Have oral suction available    Recommendations for follow up therapy are one component of a multi-disciplinary discharge planning process, led by the attending physician.  Recommendations may be updated based on patient status, additional functional criteria and insurance authorization.  Follow up Recommendations  (tba)      Assistance Recommended at Discharge    Functional Status Assessment Patient has had a recent decline in their functional status and demonstrates the ability to make significant improvements in function in a reasonable and predictable amount of time.  Frequency and Duration min 2x/week  Prognosis Prognosis for Safe Diet Advancement: Good      Swallow Study   General HPI: Pt is a 60 yo female who presented to the [facility] ED on 01/12/2021 via EMS due to acute respiratory failure and unresponsiveness. ETT 11/26-11/27; reintubated 11/27 with  CODE STEMI and extubated 11/30; developed stridor and vomiting with concern for aspiration so reintubated 11/30 until self-extubated 12/4. Pt s/p CABG x4 on 12/2. PMH includes: PAD, HTN, HLD, current tobacco abuse, COPD, DM2, CVA, GERD, hiatal hernia, anxiety Type of Study: Bedside Swallow Evaluation Previous Swallow Assessment: none in chart Diet Prior to this Study: Regular;Thin liquids Temperature Spikes Noted: No Respiratory Status: Nasal cannula History of Recent Intubation: Yes (x3) Length of Intubations (days): 9 days Date extubated: 01/20/21 (self-extubated) Behavior/Cognition: Alert;Cooperative;Pleasant mood Oral Cavity Assessment: Within Functional Limits (little bit of a coating on her tongue) Oral Care Completed by SLP: No Oral Cavity - Dentition: Adequate natural dentition Vision: Functional for self-feeding Self-Feeding Abilities: Able to feed self Patient Positioning: Upright in bed Baseline Vocal Quality: Hoarse;Low vocal intensity Volitional Cough: Weak;Congested Volitional Swallow: Able to elicit    Oral/Motor/Sensory Function Overall Oral Motor/Sensory Function: Generalized oral weakness   Ice Chips Ice chips: Impaired Presentation: Spoon Pharyngeal Phase Impairments: Multiple swallows;Throat Clearing - Immediate   Thin Liquid Thin Liquid: Impaired Presentation: Cup;Self Fed;Spoon Pharyngeal  Phase Impairments: Multiple swallows;Cough - Immediate;Cough - Delayed    Nectar Thick Nectar Thick Liquid: Not tested   Honey Thick Honey Thick Liquid: Not tested   Puree Puree: Impaired Presentation: Spoon Pharyngeal Phase Impairments: Multiple swallows   Solid     Solid: Not tested      Osie Bond., M.A. [place] [facility] Pager [phone] Office [phone]  01/21/2021,10:57 AM

## 2021-01-21 NOTE — Progress Notes (Signed)
NAME:  Morgan Daniels, MRN:  [id number], DOB:  Jul 25, 1960, LOS: 8 ADMISSION DATE:  01/13/2021, CONSULTATION DATE:  01/14/2021 REFERRING MD:  Marcelle Smiling, REASON FOR CONSULT:  ventilator assistance   History of Present Illness:  60 year old woman who presented to [facility] ED 01/12/2021 via EMS for acute respiratory failure and unresponsiveness.  PMH significant for PAD, HTN, HLD, current tobacco abuse, COPD, DM2.  Per ED documentation/EMS report, patient began having difficulty breathing and told her husband that she felt as if she was going to die.  Husband placed her on his home CPAP machine and contacted EMS.  On EMS arrival, patient was agonally breathing and hypoxic; transitioned to [facility] airway. Patient never lost pulses. She was intubated in the ED. The patient did not meet STEMI criteria.  PE vs. PNA. Empiric cefepime/vancomycin was given. CTA negative for PE, CTH negative. The patient was extubated 11/27.  Patient later developed respiratory distress, was reintubated, CODE STEMI was called. In cath lab, found to have a 100% occluded RCA. IABP was placed. Transferred to [facility] to be managed by cardiology. Underwent CABG 12/2 (Dr. Roxan Hockey). IABP removed 12/3.  Pertinent  Medical History  PAD, HTN, HLD, current tobacco abuse, COPD, DM2, CVA, peptic ulcer  Significant Hospital Events: Including procedures, antibiotic start and stop dates in addition to other pertinent events   01/12/2021: Patient admitted to [facility] ICU with acute hypoxic respiratory failure requiring emergent intubation and mechanical ventilatory support secondary to pneumonia versus acute heart failure.  CTA negative for PE, CTh negative. 01/13/21: Patient extubated during the day, ECHO: LVEF 60-65%, no wall motion abnormalities, G1DD. cardiology planned for scheduled LHC. Patient then developed respiratory distress and was emergently re-intubated CODE STEMI called patient taken to cath lab, RCA 100% occluded > IABP placed emergently  and patient transferred to [facility] emergently. 11/28 TXR to [facility]. Cardiology primary. PCCM consulted 11/30 responded well to diuresis. EEG done 11/29 no seizure activity. Passed her SBT. Extubated. Diuretics continued. Still on IABP. Cultures from [facility] grew strep agalactiae abx narrowed to ampicillin w/ plan to complete total of 7 days.  Reintubated later in the day. 12/2 CABG 4  USING LEFT INTERNAL MAMMARY ARTERY AND LEFT ENDOSCOPIC GREATER SAPHENOUS VEIN CONDUITS - diffusely diseased targets but good LV function.  12/3 IABP removed 12/4 Self-extubated 12/5 Stable O2 needs on HFNC ([place]). Failed SLP eval, Cortrak today.   Interim History / Subjective:  No significant events overnight Self-extubated yesterday, doing well on HFNC C/o mild chest "tightness", no other complaints Chest tube with air leak, repeat CXR today Lasix today, supp K pending labs Failed SLP eval, Cortrak today 12/5 Wires out today, Plavix per TCTS PT/OT consults  Objective   Blood pressure (!) 159/79, pulse 80, temperature 98 F (36.7 C), resp. rate 19, height 5\' 6"  (1.676 m), weight 93 kg, SpO2 96 %. CVP:  [7 mmHg-27 mmHg] 17 mmHg  Vent Mode: PRVC FiO2 (%):  [40 %] 40 % Set Rate:  [22 bmp] 22 bmp Vt Set:  [470 mL] 470 mL PEEP:  [5 cmH20] 5 cmH20 Pressure Support:  [5 cmH20-15 cmH20] 15 cmH20 Plateau Pressure:  [27 cmH20] 27 cmH20   Intake/Output Summary (Last 24 hours) at 01/21/2021 0907 Last data filed at 01/21/2021 0841 Gross per 24 hour  Intake 342.73 ml  Output 2587 ml  Net -2244.27 ml    Filed Weights   01/18/21 0445 01/19/21 0515 01/20/21 0645  Weight: 87.5 kg 90.1 kg 93 kg   Physical Examination: General: Acute-on-chronically ill-appearing middle-aged  woman in NAD. HEENT: [place]/AT, anicteric sclera, PERRL, moist mucous membranes. HFNC in place. Neuro: Awake, oriented x 4. Responds to verbal stimuli. Following commands consistently. Moves all 4 extremities spontaneously. CV: RRR, no m/g/r. Midline  sternotomy with dressing in place, c/d/i. CT with serosanguineous output. PULM: Breathing even and unlabored on HFNC (5L). Lung fields diminished throughout with faint bibasilar crackles. GI: Soft, nontender, mildly distended. Hypoactive bowel sounds. Extremities: Trace symmetric BLE edema noted. Skin: Warm/dry, No rashes.  Resolved Hospital Problem List:  Cardiogenic shock following CABG following non-STEMI Acute encephalopathy  Assessment & Plan:  Acute postoperative respiratory failure require mechanical ventilation CAP, multifocal pneumonia with possible aspiration pneumonitis (preoperative) COPD with active tobacco abuse Initial presentation with respiratory distress/failure. King airway placed by EMS. Intubated on ED arrival 11/26, extubated 11/27. Reintubated 11/27-11/30, 11/30 - 12/4 in the setting of likely aspiration/ileus. And CABG. Growing Strep agalactiae pneumonia at admission. - Continue supplemental O2 - Wean O2 for sat > 88% - Continue bronchodilators - Consider steroids if ongoing SOB, inability to wean O2 - Diuresis as indicated, monitor I&Os - Pulmonary hygiene - Zosyn x 7-day course for PNA (end 12/7) - Will require tobacco cessation counseling; may be a good candidate for Chantix in the outpatient setting, would not initiate this while in ICU  Coronary artery disease S/p CABG x4 (12/2) Code STEMI 11/27 with 100% occlusion of RCA. S/p CABG 12/2. - Postoperative management per TCTS - CT with air leak, CXR ordered for today - Wires to be removed today, 12/5 - DAPT per TCTS  Ileus At risk for malnutrition Ileus contributing to increased aspiration risk. Failed bedside SLP eval 12/5. - Cortrak today, 12/5 - Nutrition consulted, appreciate recs - TF initiation as appropriate  DM2 w/ hyperglycemia, controlled - CBGs Q4H - SSI - TF coverage Q4H  History of HTN HLD - Continue metoprolol BID - ASA/statin as above - Cardiac monitoring  Physical  deconditioning - PT/OT consults placed - SLP following  Best Practice (right click and "Reselect all SmartList Selections" daily)   Diet/type: tubefeeds, Cortrak to be placed 12/5 DVT prophylaxis: SCDs, Lovenox GI prophylaxis: PPI Lines: Central line and Arterial Line Foley:  Yes, and it is still needed Code Status:  full code Last date of multidisciplinary goals of care discussion [per primary]  Critical care time: 35 minutes   Lestine Mount, PA-C [facility] 01/21/21 9:30 AM  Please see [email or web address] for pager details.  From 7A-7P if no response, please call [phone] After hours, please call ELink [phone]

## 2021-01-21 NOTE — Progress Notes (Addendum)
TCTS DAILY ICU PROGRESS NOTE                   [address]            [place] [address]          [phone]   3 Days Post-Op Procedure(s) (LRB): CORONARY ARTERY BYPASS GRAFTING (CABG) x 4  USING LEFT INTERNAL MAMMARY ARTERY AND LEFT ENDOSCOPIC GREATER SAPHENOUS VEIN CONDUITS (N/A) TRANSESOPHAGEAL ECHOCARDIOGRAM (TEE) (N/A) APPLICATION OF CELL SAVER ENDOVEIN HARVEST OF GREATER SAPHENOUS VEIN (Left)  Total Length of Stay:  LOS: 8 days   Subjective: Self extubated yesterday, appears to be tolerating Failed bedside swallow   Objective: Vital signs in last 24 hours: Temp:  [98 F (36.7 C)-98.6 F (37 C)] 98 F (36.7 C) (12/05 0700) Pulse Rate:  [65-103] 78 (12/05 0600) Cardiac Rhythm: Normal sinus rhythm (12/05 0000) Resp:  [15-34] 19 (12/05 0600) BP: (146-176)/(67-82) 162/74 (12/05 0600) SpO2:  [84 %-100 %] 96 % (12/05 0600) Arterial Line BP: (118-211)/(54-90) 166/67 (12/04 1615) FiO2 (%):  [40 %] 40 % (12/04 1200)  Filed Weights   01/18/21 0445 01/19/21 0515 01/20/21 0645  Weight: 87.5 kg 90.1 kg 93 kg    Weight change:    Hemodynamic parameters for last 24 hours: CVP:  [7 mmHg-27 mmHg] 17 mmHg  Intake/Output from previous day: 12/04 0701 - 12/05 0700 In: 439.3 [I.V.:266.7; NG/GT:60; IV Piggyback:112.6] Out: 2217 [Urine:2025; Emesis/NG output:80; [place]; Chest Tube:110]  Intake/Output this shift: No intake/output data recorded.  Current Meds: Scheduled Meds:  acetaminophen  1,000 mg Oral Q6H   Or   acetaminophen (TYLENOL) oral liquid 160 mg/5 mL  1,000 mg Per Tube Q6H   amiodarone  400 mg Per NG tube Daily   arformoterol  15 mcg Nebulization BID   aspirin EC  325 mg Oral Daily   Or   aspirin  324 mg Per Tube Daily   atorvastatin  80 mg Per Tube Daily   budesonide (PULMICORT) nebulizer solution  0.25 mg Nebulization BID   chlorhexidine gluconate (MEDLINE KIT)  15 mL Mouth Rinse BID   Chlorhexidine Gluconate Cloth  6 each Topical Daily    enoxaparin (LOVENOX) injection  40 mg Subcutaneous QHS   insulin aspart  0-24 Units Subcutaneous TID WC   mouth rinse  15 mL Mouth Rinse 10 times per day   metoprolol tartrate  12.5 mg Oral BID   Or   metoprolol tartrate  12.5 mg Per Tube BID   pantoprazole sodium  40 mg Per Tube Daily   revefenacin  175 mcg Nebulization Daily   sodium chloride flush  10-40 mL Intracatheter Q12H   sodium chloride flush  3 mL Intravenous Q12H   Continuous Infusions:  sodium chloride 20 mL/hr at 01/18/21 1938   sodium chloride     sodium chloride Stopped (01/18/21 1715)   dexmedetomidine (PRECEDEX) IV infusion Stopped (01/20/21 1452)   lactated ringers     lactated ringers     lactated ringers Stopped (01/20/21 1431)   piperacillin-tazobactam (ZOSYN)  IV Stopped (01/21/21 0436)   PRN Meds:.sodium chloride, albuterol, dextrose, docusate, fentaNYL (SUBLIMAZE) injection, lactated ringers, metoprolol tartrate, ondansetron (ZOFRAN) IV, oxyCODONE, sodium chloride flush, sodium chloride flush, traMADol  General appearance: alert, cooperative, and no distress Heart: regular rate and rhythm Lungs: dim in lower fields Abdomen: minor distension, soft non tender Extremities: minor edema Wound: evh site ok, chest dressing in place  Lab Results: CBC: Recent Labs    01/19/21  1559 01/20/21 0415 01/20/21 0420  WBC 13.3* 13.7*  --   HGB 9.3* 9.4* 8.8*  HCT 28.2* 28.8* 26.0*  PLT 120* 162  --    BMET:  Recent Labs    01/19/21 1559 01/20/21 0415 01/20/21 0420  NA 139 141 141  K 4.4 3.9 4.0  CL 105 104  --   CO2 25 25  --   GLUCOSE 220* 196*  --   BUN 47* 49*  --   CREATININE 1.34* 1.11*  --   CALCIUM 8.3* 8.3*  --     CMET: Lab Results  Component Value Date   WBC 13.7 (H) 01/20/2021   HGB 8.8 (L) 01/20/2021   HCT 26.0 (L) 01/20/2021   PLT 162 01/20/2021   GLUCOSE 196 (H) 01/20/2021   CHOL 138 01/16/2021   TRIG 184 (H) 01/16/2021   HDL 30 (L) 01/16/2021   LDLDIRECT 55.5 01/15/2021    LDLCALC 71 01/16/2021   ALT 24 01/12/2021   AST 39 01/12/2021   NA 141 01/20/2021   K 4.0 01/20/2021   CL 104 01/20/2021   CREATININE 1.11 (H) 01/20/2021   BUN 49 (H) 01/20/2021   CO2 25 01/20/2021   INR 1.5 (H) 01/18/2021   HGBA1C 8.8 (H) 01/14/2021      PT/INR:  Recent Labs    01/18/21 1710  LABPROT 18.1*  INR 1.5*   Radiology: No results found.   Assessment/Plan: S/P Procedure(s) (LRB): CORONARY ARTERY BYPASS GRAFTING (CABG) x 4  USING LEFT INTERNAL MAMMARY ARTERY AND LEFT ENDOSCOPIC GREATER SAPHENOUS VEIN CONDUITS (N/A) TRANSESOPHAGEAL ECHOCARDIOGRAM (TEE) (N/A) APPLICATION OF CELL SAVER ENDOVEIN HARVEST OF GREATER SAPHENOUS VEIN (Left)  1 afeb, VSS SBP 140's-170's, siinus rhythm, Co-Ox 73- no inotropes or pressors 2 sats good on 5 liters 3 good UOP, not weighed yet 4 labs/CXR pending 5 increasing lopressor 6 cont to diurese 7 core trak- will place consult, with failed swallow, difficulty with N/V 8 PCCM assisting with pulm management, critical care issues   John Giovanni PA-C Pager [phone] 01/21/2021 7:30 AM   Stable overnight On 5 L Will diurese, and increase BB Will remove wires, and start plavix Small air leak on exam, will keep tubes for now [employer]

## 2021-01-21 NOTE — Hospital Course (Addendum)
History of Present Illness:     [name] is a 60 year old female with a past history of peripheral arterial disease status post bilateral carotid artery stenting several years ago, hypertension, dyslipidemia, tobacco abuse with COPD, and type 2 diabetes mellitus, and severe, chronic irritable bowel syndrome who presented to [facility] emergency room on 01/12/2021 via EMS for management of acute respiratory failure and unresponsiveness.  The EMS service reported that on their arrival to her home, she had agonal respirations and was severely hypoxic.  She was supported with a King airway in route to the hospital.  Shortly after arrival to the emergency room, she was intubated and started on mechanical ventilation.  Initial EKG showed ST depressions in the V leads.  Serial troponins peaked at around 3000.  Cardiology was consulted.  Additional work-up obtained shortly after presentation included chest x-ray that showed diffuse interstitial and airspace opacities consistent with pulmonary edema.  CT scan of the head was negative for any acute intracranial abnormality.  CTA of the chest was negative for pulmonary embolus.  Pulmonary edema was confirmed and Multi lobar pneumonia was suspected.  In addition, there was evidence of coronary calcification in all 3 main coronary artery distributions.  There was some bilateral hilar adenopathy noted on the CTA as well.  Morgan Daniels was admitted to the hospital and started on empiric vancomycin and cefepime.  This was later converted to doxycycline and Rocephin.  She was started on a heparin infusion for non-ST elevation MI.  She was weaned from the ventilator and extubated on the day following admission (01/13/21).  She was initially stable but later in the day, she developed respiratory distress once again and was reintubated and placed back on mechanical ventilation.  An echocardiogram was obtained showing an estimated ejection fraction of 60 to  65%.  There was no significant valvular pathology.  Morgan Daniels cardiologist, Dr. Peggye Form, felt she was having flash pulmonary edema related to her acute MI.  She was taken to the Cath Lab urgently last evening where left heart catheterization was performed demonstrating severe three-vessel coronary artery disease with an 80% distal left main stenosis, 95% ostial LAD stenosis, and 95% stenosis of the ostial circumflex coronary artery.  The right coronary artery was occluded proximally.  There was mildly reduced left ventricular function noted with estimated left ventricular ejection fraction of 40% with anteroapical and inferolateral apical hypokinesis.  Intra-aortic balloon pump was placed in the Cath Lab. [name] was transferred to [facility] late in the day on 01/14/21 for additional evaluation and management of her severe three-vessel coronary artery disease. The critical care medicine team has been consulted for management of mechanical ventilation.  Ms. Harcum remains sedated and intubated.  The most recent blood gas obtained at 3 AM today showed a pH of 7.32, PCO2 52.9 PO2 100 bicarbonate 27.8 base excess of 1.0.  CT surgery has been asked to evaluate Morgan Daniels for consideration of coronary bypass grafting after presenting with acute non-ST elevation myocardial infarction with pulmonary edema and possible multi lobar pneumonia. Morgan Daniels is currently resting in bed in the ICU.  She is sedated with fentanyl and propofol but awakens to voice and responds appropriately.  She denies having any pain currently. Morgan Daniels husband is at the bedside and answered questions regarding her history.  He tells me that she is on disability for longstanding severe irritable bowel syndrome.  He said she has been notably more fatigued with minimal  activity for about a year.    Hospital Course: Ms. Maka was diuresed aggressively and as a result, oxygenation improved.  Her hemodynamics  also improved over the next few days.  She was extubated on 01/16/2021 but developed respiratory distress later that day requiring repeat intubation and resumption of mechanical ventilation.  Diuresis continued.  Respiratory and hemodynamic status gradually improved.  By 01/18/2021, Dr. Roxan Hockey felt her pulmonary status and hemodynamics have been optimized and decided to proceed with coronary bypass grafting for her critical left main stenosis.  The intra-aortic balloon pump remained in place and functioning appropriately.  She had satisfactory perfusion to both lower extremities.  She was taken to the operating room on 12/19/2020 where CABG x4 was accomplished.  The left internal mammary artery was grafted to the left anterior descending coronary artery.  Saphenous vein was grafted to the first diagonal, the obtuse marginal, and distal right coronary.  Following the procedure, she separated from cardiopulmonary bypass with stable hemodynamics on the first attempt.  She remained hemodynamically stable throughout the post bypass period.  She was transferred to the ICU in stable condition with the intra-aortic balloon pump in place.  Amiodarone, which has been initiated in the operating room for atrial fibrillation prior to cardiopulmonary bypass, was continued postoperatively.  Vital signs and hemodynamics remained stable.  She is self extubated on postop day 2 and was placed on nasal cannula O2.  Her respiratory status remained stable.  On postop day 3, she was maintaining sinus rhythm on amiodarone.  Co. ox was 73 without any inotropic support or vasopressor support.  Metoprolol was titrated for hypertension.  She had poor oral oral intake and had a swallow evaluation by the speech pathology team demonstrating high risk for aspiration.  She was started on Empiric Zosyn.  Requested placement of a Cor Trak feeding tube for nutritional support..  Pacer wires were removed on the third postoperative day and she was  started on Plavix for her preoperative acute non-ST elevation myocardial infarction.  The patient's swallowing function did not improve.  SLP recommended a Modified Barium Swallow.  This showed continued evidence of dysphagia.  She was felt safe to have some honey thickened liquids or pureed snacks  The patient's chest tubes were removed on 01/22/2021.  She was hypertensive and started on Norvasc.  The patient was volume overloaded and started on diuretics.  She was transferred to the progressive unit on 01/23/2021.  She unfortunately suffered a fall during which her Cortrak tube fell out.  She was further evaluated by SLP and they recommended continued NPO status.  They recommended replacement of cortrak with initiation of tube feedings.  She was allowed to have pureed snacks and honey thickened liquid.  The patient has remained in NSR.  Her surgical incisions are healing without infection.  Her chest tube sites have some eschar present and I have recommended bacitracin ointment for this.  She has developed hematuria from foley removal, this is likely due to trauma.  UA was obtained to rule out UTI. Her H/H have remained stable.  The patient is deconditioned and will require CIR at discharge.  She is medically stable for discharge today.

## 2021-01-21 NOTE — Progress Notes (Signed)
Patient ID: Morgan Daniels, female   DOB: 1960-05-06, 60 y.o.   MRN: [id number]  TCTS Evening Rounds:  Hemodynamically stable in sinus rhythm.  Sats 93% 5L [place] Good uo  -800 cc today so far.  CT output 220 since this am.

## 2021-01-21 NOTE — Plan of Care (Signed)
  Problem: Education: Goal: Knowledge of General Education information will improve Description: Including pain rating scale, medication(s)/side effects and non-pharmacologic comfort measures Outcome: Progressing   Problem: Clinical Measurements: Goal: Ability to maintain clinical measurements within normal limits will improve Outcome: Progressing Goal: Will remain free from infection Outcome: Progressing Goal: Diagnostic test results will improve Outcome: Progressing Goal: Respiratory complications will improve Outcome: Progressing Goal: Cardiovascular complication will be avoided Outcome: Progressing   Problem: Activity: Goal: Risk for activity intolerance will decrease Outcome: Progressing   Problem: Coping: Goal: Level of anxiety will decrease Outcome: Progressing   Problem: Pain Managment: Goal: General experience of comfort will improve Outcome: Progressing   Problem: Safety: Goal: Ability to remain free from injury will improve Outcome: Progressing   Problem: Skin Integrity: Goal: Risk for impaired skin integrity will decrease Outcome: Progressing   Problem: Education: Goal: Will demonstrate proper wound care and an understanding of methods to prevent future damage Outcome: Progressing Goal: Knowledge of disease or condition will improve Outcome: Progressing Goal: Knowledge of the prescribed therapeutic regimen will improve Outcome: Progressing Goal: Individualized Educational Video(s) Outcome: Progressing   Problem: Activity: Goal: Risk for activity intolerance will decrease Outcome: Progressing   Problem: Cardiac: Goal: Will achieve and/or maintain hemodynamic stability Outcome: Progressing   Problem: Clinical Measurements: Goal: Postoperative complications will be avoided or minimized Outcome: Progressing   Problem: Respiratory: Goal: Respiratory status will improve Outcome: Progressing   Problem: Skin Integrity: Goal: Wound healing without  signs and symptoms of infection Outcome: Progressing Goal: Risk for impaired skin integrity will decrease Outcome: Progressing   Problem: Urinary Elimination: Goal: Ability to achieve and maintain adequate renal perfusion and functioning will improve Outcome: Progressing

## 2021-01-21 NOTE — Discharge Summary (Signed)
Physician Discharge Summary  Patient ID: Morgan Daniels MRN: [id number] DOB/AGE: Jul 18, 1960 60 y.o.  Admit date: 01/13/2021 Discharge date: 01/25/2021  Patient Active Problem List   Diagnosis Date Noted   Encephalopathy acute    Acute pulmonary edema ([place])    Community acquired pneumonia    Acute respiratory failure with hypoxia and hypercapnia ([place]) 01/13/2021   NSTEMI (non-ST elevated myocardial infarction) ([place]) 01/13/2021   T2DM (type 2 diabetes mellitus) ([place]) 01/13/2021   Hypercholesteremia 01/13/2021   Tobacco abuse 01/13/2021   Cardiogenic shock ([place]) 01/13/2021   Acute respiratory failure ([place]) 01/12/2021   Discharge Diagnoses:   Patient Active Problem List   Diagnosis Date Noted   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   Encephalopathy acute    Acute pulmonary edema ([place])    Community acquired pneumonia    Acute respiratory failure with hypoxia and hypercapnia ([place]) 01/13/2021   NSTEMI (non-ST elevated myocardial infarction) ([place]) 01/13/2021   T2DM (type 2 diabetes mellitus) ([place]) 01/13/2021   Hypercholesteremia 01/13/2021   Tobacco abuse 01/13/2021   Cardiogenic shock ([place]) 01/13/2021   Acute respiratory failure ([place]) 01/12/2021   Discharged Condition: good  History of Present Illness:     Morgan Daniels is a 60 year old female with a past history of peripheral arterial disease status post bilateral carotid artery stenting several years ago, hypertension, dyslipidemia, tobacco abuse with COPD, and type 2 diabetes mellitus, and severe, chronic irritable bowel syndrome who presented to [facility] emergency room on 01/12/2021 via EMS for management of acute respiratory failure and unresponsiveness.  The EMS service reported that on their arrival to her home, she had agonal respirations and was severely hypoxic.  She was supported with a King airway in route to the hospital.  Shortly after arrival to the emergency room, she was intubated and started on mechanical  ventilation.  Initial EKG showed ST depressions in the V leads.  Serial troponins peaked at around 3000.  Cardiology was consulted.  Additional work-up obtained shortly after presentation included chest x-ray that showed diffuse interstitial and airspace opacities consistent with pulmonary edema.  CT scan of the head was negative for any acute intracranial abnormality.  CTA of the chest was negative for pulmonary embolus.  Pulmonary edema was confirmed and Multi lobar pneumonia was suspected.  In addition, there was evidence of coronary calcification in all 3 main coronary artery distributions.  There was some bilateral hilar adenopathy noted on the CTA as well.  Morgan Daniels was admitted to the hospital and started on empiric vancomycin and cefepime.  This was later converted to doxycycline and Rocephin.  She was started on a heparin infusion for non-ST elevation MI.  She was weaned from the ventilator and extubated on the day following admission (01/13/21).  She was initially stable but later in the day, she developed respiratory distress once again and was reintubated and placed back on mechanical ventilation.  An echocardiogram was obtained showing an estimated ejection fraction of 60 to 65%.  There was no significant valvular pathology.  Morgan Daniels cardiologist, Dr. Peggye Form, felt she was having flash pulmonary edema related to her acute MI.  She was taken to the Cath Lab urgently last evening where left heart catheterization was performed demonstrating severe three-vessel coronary artery disease with an 80% distal left main stenosis, 95% ostial LAD stenosis, and 95% stenosis of the ostial circumflex coronary artery.  The right coronary artery was occluded proximally.  There was mildly reduced left ventricular function noted with estimated  left ventricular ejection fraction of 40% with anteroapical and inferolateral apical hypokinesis.  Intra-aortic balloon pump was placed in the Cath Lab. Ms.  Daniels was transferred to [facility] late in the day on 01/14/21 for additional evaluation and management of her severe three-vessel coronary artery disease. The critical care medicine team has been consulted for management of mechanical ventilation.  Morgan Daniels remains sedated and intubated.  The most recent blood gas obtained at 3 AM today showed a pH of 7.32, PCO2 52.9 PO2 100 bicarbonate 27.8 base excess of 1.0.  CT surgery has been asked to evaluate Morgan Daniels for consideration of coronary bypass grafting after presenting with acute non-ST elevation myocardial infarction with pulmonary edema and possible multi lobar pneumonia. Morgan Daniels is currently resting in bed in the ICU.  She is sedated with fentanyl and propofol but awakens to voice and responds appropriately.  She denies having any pain currently. Morgan Daniels husband is at the bedside and answered questions regarding her history.  He tells me that she is on disability for longstanding severe irritable bowel syndrome.  He said she has been notably more fatigued with minimal activity for about a year.    Hospital Course: Morgan Daniels was diuresed aggressively and as a result, oxygenation improved.  Her hemodynamics also improved over the next few days.  She was extubated on 01/16/2021 but developed respiratory distress later that day requiring repeat intubation and resumption of mechanical ventilation.  Diuresis continued.  Respiratory and hemodynamic status gradually improved.  By 01/18/2021, Dr. Roxan Hockey felt her pulmonary status and hemodynamics have been optimized and decided to proceed with coronary bypass grafting for her critical left main stenosis.  The intra-aortic balloon pump remained in place and functioning appropriately.  She had satisfactory perfusion to both lower extremities.  She was taken to the operating room on 12/19/2020 where CABG x4 was accomplished.  The left internal mammary artery was grafted to the left  anterior descending coronary artery.  Saphenous vein was grafted to the first diagonal, the obtuse marginal, and distal right coronary.  Following the procedure, she separated from cardiopulmonary bypass with stable hemodynamics on the first attempt.  She remained hemodynamically stable throughout the post bypass period.  She was transferred to the ICU in stable condition with the intra-aortic balloon pump in place.  Amiodarone, which has been initiated in the operating room for atrial fibrillation prior to cardiopulmonary bypass, was continued postoperatively.  Vital signs and hemodynamics remained stable.  She is self extubated on postop day 2 and was placed on nasal cannula O2.  Her respiratory status remained stable.  On postop day 3, she was maintaining sinus rhythm on amiodarone.  Co. ox was 73 without any inotropic support or vasopressor support.  Metoprolol was titrated for hypertension.  She had poor oral oral intake and had a swallow evaluation by the speech pathology team demonstrating high risk for aspiration.  She was started on Empiric Zosyn.  Requested placement of a Cor Trak feeding tube for nutritional support..  Pacer wires were removed on the third postoperative day and she was started on Plavix for her preoperative acute non-ST elevation myocardial infarction.  The patient's swallowing function did not improve.  SLP recommended a Modified Barium Swallow.  This showed continued evidence of dysphagia.  She was felt safe to have some honey thickened liquids or pureed snacks  The patient's chest tubes were removed on 01/22/2021.  She was hypertensive and started on Norvasc.  The patient was volume  overloaded and started on diuretics.  She was transferred to the progressive unit on 01/23/2021.  She unfortunately suffered a fall during which her Cortrak tube fell out.  She was further evaluated by SLP and they recommended continued NPO status.  They recommended replacement of cortrak with initiation of  tube feedings.  She was allowed to have pureed snacks and honey thickened liquid.  The patient has remained in NSR.  Her surgical incisions are healing without infection.  Her chest tube sites have some eschar present and I have recommended bacitracin ointment for this.  She has developed hematuria from foley removal, this is likely due to trauma.  UA was obtained to rule out UTI. Her H/H have remained stable.  The patient is deconditioned and will require CIR at discharge.  She is medically stable for discharge today.   Consults: pulmonary/intensive care  Significant Diagnostic Studies: angiography:     Prox RCA lesion is 100% stenosed.   Dist LM to Ost LAD lesion is 95% stenosed.   Ost Cx lesion is 95% stenosed.   Mid Cx lesion is 70% stenosed.   Mid LAD lesion is 75% stenosed.   Mid LM to Dist LM lesion is 80% stenosed.   The left ventricular ejection fraction is 35-45% by visual estimate.   1.  Non-ST elevation myocardial infarction 2.  Three-vessel coronary artery disease with 80% distal left main, 95% stenosis ostial LAD, 95% stenosis ostial left circumflex, occluded proximal RCA 3.  Mildly reduced left ventricular function with estimated LV ejection fraction 40% with anteroapical and inferoapical hypokinesis 4.  Intra-aortic balloon pump placed   Recommendations   1.  Transfer to [facility] for urgent coronary artery bypass graft surgery  Treatments:   Operative Report    DATE OF PROCEDURE: 01/18/2021   PREOPERATIVE DIAGNOSIS:  Left main and 3-vessel coronary artery disease.   POSTOPERATIVE DIAGNOSIS:  Left main and 3-vessel coronary artery disease.   PROCEDURES PERFORMED:  Median sternotomy, extracorporeal circulation, coronary artery bypass grafting x4 (left internal mammary artery to LAD, saphenous vein graft to first diagonal, saphenous vein graft to the obtuse marginal, saphenous vein graft to  distal right coronary), endoscopic vein harvest, right leg.    SURGEON:  Revonda Standard. Roxan Hockey, MD   ASSISTANT:  Enid Cutter, PA   ANESTHESIA:  General.   FINDINGS:  Transesophageal echocardiography showed preserved left ventricular function.  Tiny PFO.  No significant valvular pathology.  Unchanged post-bypass, good quality conduits, diffusely diseased fair quality target vessels, distal right coronary.   Poor quality target.   INDICATIONS:  The patient is a 61 year old woman with multiple cardiac risk factors and known atherosclerotic cardiovascular disease.  She presented with acute respiratory failure and unresponsiveness.  She was found to have left main and 3-vessel  coronary artery disease.  Intraaortic balloon pump was placed and she was transferred to [facility].  She was initially treated for pneumonia in addition to pulmonary edema.  She was extubated at one point, but required reintubation for acute respiratory  failure, although high risk given her pulmonary status and bypass grafting is critical, given the severity of her disease.  The indications, risks, benefits, and alternatives were discussed with the patient's family, they gave permission and agreed for  her to proceed.  Discharge Exam: Blood pressure 137/74, pulse 85, temperature 98.3 F (36.8 C), temperature source Oral, resp. rate 20, height 5\' 6"  (1.676 m), weight 86.7 kg, SpO2 92 %.  General appearance: alert, cooperative, and no distress  Heart: regular rate and rhythm Lungs: clear to auscultation bilaterally Abdomen: soft, non-tender; bowel sounds normal; no masses,  no organomegaly Extremities: edema trace non-pitting Wound: clean and dry  Disposition: Discharge disposition: [place] Not Defined       Allergies as of 01/25/2021       Reactions   Simvastatin    Diarrhea, nausea,vomiting   Lactose Intolerance (gi) Diarrhea   Morphine And Related Itching   Latex         Medication List     STOP taking these medications     dicyclomine 10 MG capsule Commonly known as: BENTYL   gemfibrozil 600 MG tablet Commonly known as: LOPID   hydrochlorothiazide 12.5 MG tablet Commonly known as: HYDRODIURIL   NovoLOG FlexPen 100 UNIT/ML FlexPen Generic drug: insulin aspart   rosuvastatin 10 MG tablet Commonly known as: CRESTOR   tiZANidine 4 MG tablet Commonly known as: ZANAFLEX       TAKE these medications    albuterol (2.5 MG/3ML) 0.083% nebulizer solution Commonly known as: PROVENTIL Take 3 mLs (2.5 mg total) by nebulization every 4 (four) hours as needed for wheezing or shortness of breath.   amiodarone 200 MG tablet Commonly known as: PACERONE 2 tablets (400 mg total) by Per NG tube route daily. X 2 days, then decrease to 200 mg daily on Sunday 12/11.... medication can be crushed in puree for adminstration   amLODipine 10 MG tablet Commonly known as: NORVASC Place 1 tablet (10 mg total) into feeding tube daily.   aspirin EC 81 MG tablet Take 81 mg by mouth daily. Swallow whole.   atorvastatin 80 MG tablet Commonly known as: LIPITOR Place 1 tablet (80 mg total) into feeding tube daily.   bacitracin ointment Apply topically daily. To chest tube sites   clopidogrel 75 MG tablet Commonly known as: PLAVIX Take 75 mg by mouth daily.   feeding supplement (VITAL 1.5 CAL) Liqd Place 1,000 mLs into feeding tube continuous.   feeding supplement (PROSource TF) liquid Place 45 mLs into feeding tube 2 (two) times daily.   FLUoxetine 40 MG capsule Commonly known as: PROZAC Take 80 mg by mouth daily.   food thickener Liqd Commonly known as: SIMPLYTHICK (HONEY/LEVEL 3/MODERATELY THICK) Take 1 packet by mouth as needed.   furosemide 40 MG tablet Commonly known as: Lasix Take 1 tablet (40 mg total) by mouth daily. For 5 days   gabapentin 600 MG tablet Commonly known as: NEURONTIN Take 600 mg by mouth 3 (three) times daily.   metoprolol succinate 50 MG 24 hr tablet Commonly known as:  TOPROL-XL Take 50 mg by mouth daily.   nicotine 14 mg/24hr patch Commonly known as: NICODERM CQ - dosed in mg/24 hours Place 1 patch (14 mg total) onto the skin daily.   pantoprazole sodium 40 mg Commonly known as: PROTONIX Place 40 mg into feeding tube daily.   potassium chloride 20 MEQ/15ML (10%) Soln Place 15 mLs (20 mEq total) into feeding tube daily. For 5 days   traMADol 50 MG tablet Commonly known as: ULTRAM Take 1 tablet (50 mg total) by mouth every 6 (six) hours as needed for moderate pain. What changed: when to take this   [place] FlexTouch 200 UNIT/ML FlexTouch Pen Generic drug: insulin degludec Inject 80 Units into the skin at bedtime.   umeclidinium-vilanterol 62.5-25 MCG/ACT Aepb Commonly known as: ANORO ELLIPTA Inhale 1 puff into the lungs daily.         Follow-up Information  [name], MD. Go on 02/26/2021.   Specialty: Cardiothoracic Surgery Why: Your appointment is at 11:30 AM.   Please arrive 30 minutes early for chest x-ray to be performed by [facility] located on the first floor of the same building. Contact information: [address] [place] [address] [phone]               The patient has been discharged on:   1.Beta Blocker:  Yes [ x ]                              No   [   ]                              If No, reason:  2.Ace Inhibitor/ARB: Yes [   ]                                     No  [ x   ]                                     If No, reason: titration of BB  3.Statin:   Yes [x  ]                  No  [   ]                  If No, reason:  4.Ecasa:  Yes  [ x]                  No   [   ]                  If No, reason:   5. Plavix:  Yes [x  ]                    No [  ]  Signed:  Zamauri Nez, PA-C 01/25/2021, 10:56 AM

## 2021-01-21 NOTE — Procedures (Signed)
Cortrak  Person Inserting Tube:  Morgan Daniels, Morgan Daniels, RD Tube Type:  Cortrak - 43 inches Tube Size:  10 Tube Location:  Left nare Initial Placement:  Stomach Secured by: Bridle Technique Used to Measure Tube Placement:  Marking at nare/corner of mouth Cortrak Secured At:  70 cm  Cortrak Tube Team Note:  Consult received to place a Cortrak feeding tube.   X-ray is required, abdominal x-ray has been ordered by the Cortrak team. Please confirm tube placement before using the Cortrak tube.   If the tube becomes dislodged please keep the tube and contact the Cortrak team at [email or web address] (password [email or web address]) for replacement.  If after hours and replacement cannot be delayed, place a NG tube and confirm placement with an abdominal x-ray.     Theone Stanley., MS, RD, LDN (she/her/hers) RD pager number and weekend/on-call pager number located in [place].

## 2021-01-22 ENCOUNTER — Inpatient Hospital Stay (HOSPITAL_COMMUNITY): Payer: Medicare Other

## 2021-01-22 ENCOUNTER — Encounter: Payer: Self-pay | Admitting: Nurse Practitioner

## 2021-01-22 DIAGNOSIS — R739 Hyperglycemia, unspecified: Secondary | ICD-10-CM | POA: Diagnosis not present

## 2021-01-22 DIAGNOSIS — J810 Acute pulmonary edema: Secondary | ICD-10-CM | POA: Diagnosis not present

## 2021-01-22 DIAGNOSIS — R570 Cardiogenic shock: Secondary | ICD-10-CM | POA: Diagnosis not present

## 2021-01-22 DIAGNOSIS — Z951 Presence of aortocoronary bypass graft: Secondary | ICD-10-CM

## 2021-01-22 DIAGNOSIS — J9601 Acute respiratory failure with hypoxia: Secondary | ICD-10-CM | POA: Diagnosis not present

## 2021-01-22 DIAGNOSIS — Z87891 Personal history of nicotine dependence: Secondary | ICD-10-CM

## 2021-01-22 DIAGNOSIS — J42 Unspecified chronic bronchitis: Secondary | ICD-10-CM

## 2021-01-22 LAB — GLUCOSE, CAPILLARY
Glucose-Capillary: 276 mg/dL — ABNORMAL HIGH (ref 70–99)
Glucose-Capillary: 220 mg/dL — ABNORMAL HIGH (ref 70–99)
Glucose-Capillary: 185 mg/dL — ABNORMAL HIGH (ref 70–99)
Glucose-Capillary: 127 mg/dL — ABNORMAL HIGH (ref 70–99)
Glucose-Capillary: 86 mg/dL (ref 70–99)

## 2021-01-22 LAB — CBC
HCT: 31.3 % — ABNORMAL LOW (ref 36.0–46.0)
Hemoglobin: 9.3 g/dL — ABNORMAL LOW (ref 12.0–15.0)
MCH: 26.6 pg (ref 26.0–34.0)
MCHC: 29.7 g/dL — ABNORMAL LOW (ref 30.0–36.0)
MCV: 89.7 fL (ref 80.0–100.0)
Platelets: 404 10*3/uL — ABNORMAL HIGH (ref 150–400)
RBC: 3.49 MIL/uL — ABNORMAL LOW (ref 3.87–5.11)
RDW: 13.9 % (ref 11.5–15.5)
WBC: 14.6 10*3/uL — ABNORMAL HIGH (ref 4.0–10.5)
nRBC: 0.1 % (ref 0.0–0.2)

## 2021-01-22 LAB — BASIC METABOLIC PANEL
Anion gap: 10 (ref 5–15)
BUN: 43 mg/dL — ABNORMAL HIGH (ref 6–20)
CO2: 30 mmol/L (ref 22–32)
Calcium: 9 mg/dL (ref 8.9–10.3)
Chloride: 106 mmol/L (ref 98–111)
Creatinine, Ser: 0.69 mg/dL (ref 0.44–1.00)
GFR, Estimated: >60 mL/min (ref >60)
Glucose, Bld: 262 mg/dL — ABNORMAL HIGH (ref 70–99)
Potassium: 3.6 mmol/L (ref 3.5–5.1)
Sodium: 146 mmol/L — ABNORMAL HIGH (ref 135–145)

## 2021-01-22 LAB — COOXEMETRY PANEL
Carboxyhemoglobin: 1 % (ref 0.5–1.5)
Methemoglobin: 0.9 % (ref 0.0–1.5)
O2 Saturation: 58.4 %
Total hemoglobin: 9.6 g/dL — ABNORMAL LOW (ref 12.0–16.0)

## 2021-01-22 LAB — MAGNESIUM: Magnesium: 2.2 mg/dL (ref 1.7–2.4)

## 2021-01-22 LAB — PHOSPHORUS: Phosphorus: 3 mg/dL (ref 2.5–4.6)

## 2021-01-22 MED ORDER — AMLODIPINE BESYLATE 10 MG PO TABS
10.0000 mg | ORAL_TABLET | Freq: Every day | ORAL | Status: DC
  Administered 2021-01-22: 10 mg via ORAL
  Filled 2021-01-22: qty 1

## 2021-01-22 MED ORDER — INSULIN ASPART 100 UNIT/ML IJ SOLN
6.0000 [IU] | INTRAMUSCULAR | Status: DC
  Administered 2021-01-22 – 2021-01-23 (×9): 6 [IU] via SUBCUTANEOUS

## 2021-01-22 MED ORDER — UMECLIDINIUM-VILANTEROL 62.5-25 MCG/ACT IN AEPB
1.0000 | INHALATION_SPRAY | Freq: Every day | RESPIRATORY_TRACT | Status: DC
  Administered 2021-01-22 – 2021-01-25 (×3): 1 via RESPIRATORY_TRACT
  Filled 2021-01-22 (×2): qty 14

## 2021-01-22 MED ORDER — FUROSEMIDE 10 MG/ML IJ SOLN
40.0000 mg | Freq: Once | INTRAMUSCULAR | Status: AC
  Administered 2021-01-22: 40 mg via INTRAVENOUS
  Filled 2021-01-22: qty 4

## 2021-01-22 MED ORDER — FLUOXETINE HCL 20 MG PO CAPS
80.0000 mg | ORAL_CAPSULE | Freq: Every day | ORAL | Status: DC
  Administered 2021-01-22: 80 mg via ORAL
  Filled 2021-01-22: qty 4

## 2021-01-22 MED ORDER — POTASSIUM CHLORIDE 20 MEQ PO PACK
40.0000 meq | PACK | Freq: Two times a day (BID) | ORAL | Status: AC
  Administered 2021-01-22 (×2): 40 meq via ORAL
  Filled 2021-01-22 (×2): qty 2

## 2021-01-22 MED ORDER — GABAPENTIN 600 MG PO TABS
600.0000 mg | ORAL_TABLET | Freq: Three times a day (TID) | ORAL | Status: DC
  Administered 2021-01-22 (×3): 600 mg via ORAL
  Filled 2021-01-22 (×3): qty 1

## 2021-01-22 MED ORDER — INSULIN GLARGINE-YFGN 100 UNIT/ML ~~LOC~~ SOLN
40.0000 [IU] | Freq: Every day | SUBCUTANEOUS | Status: DC
  Administered 2021-01-22 – 2021-01-25 (×4): 40 [IU] via SUBCUTANEOUS
  Filled 2021-01-22 (×4): qty 0.4

## 2021-01-22 NOTE — Evaluation (Signed)
Physical Therapy Evaluation Patient Details Name: Morgan Daniels MRN: [id number] DOB: 1960/07/26 Today's Date: 01/22/2021  History of Present Illness  Pt adm to [facility] 11/26 with repiratory failure and unresponsiveness. Pt intubated on admission 11/26. Extubated 11/27 developed respiratory distress and reintubated. Code STEMI called and pt cath showed severe 3 vessel disease and IABP placed. Pt transferred to [facility]. Pt extubated 11/30 and later reintubated on 11/30.  Pt underwent CABG x 4 on 12/2.  IABP removed 12/3. Pt self extubated 12/4. PMH - PAD, CVA, HTN, CKD, DM, IBS  Clinical Impression  Pt presents to PT with significant deficits in mobility and cognition. Expect pt will make steady progress and feel that she could benefit from inpatient rehab stay prior to return home with husband to get her to a level that would allow husband to care for.        Recommendations for follow up therapy are one component of a multi-disciplinary discharge planning process, led by the attending physician.  Recommendations may be updated based on patient status, additional functional criteria and insurance authorization.  Follow Up Recommendations Acute inpatient rehab (3hours/day)    Assistance Recommended at Discharge Frequent or constant Supervision/Assistance  Functional Status Assessment Patient has had a recent decline in their functional status and demonstrates the ability to make significant improvements in function in a reasonable and predictable amount of time.  Equipment Recommendations  Rollator (4 wheels)    Recommendations for Other Services Rehab consult     Precautions / Restrictions Precautions Precautions: Fall;Sternal      Mobility  Bed Mobility               General bed mobility comments: Pt up in chair    Transfers Overall transfer level: Needs assistance Equipment used: Rollator (4 wheels) Transfers: Sit to/from Stand;Bed to chair/wheelchair/BSC Sit to Stand: +2  physical assistance;Mod assist   Step pivot transfers: Mod assist       General transfer comment: Assist to bring hips up and for balance. Needed rocking momentum to rise. Verbal/tactile cues for hands to knees for sternal precautions. Chair to bsc with pivotal steps with rollator    Ambulation/Gait Ambulation/Gait assistance: Min assist;+2 safety/equipment Gait Distance (Feet): 15 Feet Assistive device: Rollator (4 wheels) Gait Pattern/deviations: Step-through pattern;Decreased stride length;Trunk flexed;Narrow base of support Gait velocity: decr Gait velocity interpretation: <1.31 ft/sec, indicative of household ambulator   General Gait Details: Assist for balance and support. Verbal cues to widen base of support with pt almost stepping on her own feet.  Stairs            Wheelchair Mobility    Modified Rankin (Stroke Patients Only)       Balance Overall balance assessment: Needs assistance Sitting-balance support: No upper extremity supported;Feet supported Sitting balance-Leahy Scale: Fair     Standing balance support: Bilateral upper extremity supported;During functional activity;Reliant on assistive device for balance Standing balance-Leahy Scale: Poor Standing balance comment: rollator and min assist for static standing                             Pertinent Vitals/Pain Pain Assessment: No/denies pain Faces Pain Scale: No hurt    Home Living Family/patient expects to be discharged to:: Private residence Living Arrangements: Spouse/significant other Available Help at Discharge: Family;Available 24 hours/day Type of Home: House Home Access: Stairs to enter Entrance Stairs-Rails: Right Entrance Stairs-Number of Steps: 3-4   Home Layout: One level Home Equipment: Kasandra Knudsen -  single point;Shower seat - built in      Prior Function Prior Level of Function : Independent/Modified Independent                     Journalist, newspaper         Extremity/Trunk Assessment   Upper Extremity Assessment Upper Extremity Assessment: Defer to OT evaluation    Lower Extremity Assessment Lower Extremity Assessment: Generalized weakness       Communication   Communication: Other (comment) (low volume, decr ennuciation)  Cognition Arousal/Alertness: Awake/alert Behavior During Therapy: Flat affect Overall Cognitive Status: Impaired/Different from baseline Area of Impairment: Memory;Following commands;Safety/judgement;Awareness;Problem solving                     Memory: Decreased recall of precautions;Decreased short-term memory Following Commands: Follows one step commands with increased time;Follows one step commands inconsistently Safety/Judgement: Decreased awareness of deficits;Decreased awareness of safety Awareness: Intellectual Problem Solving: Slow processing;Decreased initiation;Difficulty sequencing;Requires verbal cues;Requires tactile cues General Comments: Pt required repeated cues for sternal precautions. Unaware of bowel incontinence x 2        General Comments General comments (skin integrity, edema, etc.): VSS on 5L. Reported dizziness with amb but BP stable when rechecked    Exercises     Assessment/Plan    PT Assessment Patient needs continued PT services  PT Problem List Decreased strength;Decreased range of motion;Decreased balance;Decreased activity tolerance;Decreased mobility;Decreased cognition;Decreased knowledge of use of DME;Decreased knowledge of precautions       PT Treatment Interventions DME instruction;Gait training;Stair training;Functional mobility training;Therapeutic activities;Therapeutic exercise;Balance training;Cognitive remediation;Patient/family education    PT Goals (Current goals can be found in the Care Plan section)  Acute Rehab PT Goals Patient Stated Goal: walk freely again PT Goal Formulation: With patient Time For Goal Achievement: 02/05/21 Potential to  Achieve Goals: Good    Frequency Min 3X/week   Barriers to discharge        Co-evaluation               AM-PAC PT "6 Clicks" Mobility  Outcome Measure Help needed turning from your back to your side while in a flat bed without using bedrails?: A Little Help needed moving from lying on your back to sitting on the side of a flat bed without using bedrails?: A Little Help needed moving to and from a bed to a chair (including a wheelchair)?: A Lot Help needed standing up from a chair using your arms (e.g., wheelchair or bedside chair)?: Total Help needed to walk in hospital room?: A Little Help needed climbing 3-5 steps with a railing? : Total 6 Click Score: 13    End of Session Equipment Utilized During Treatment: Gait belt Activity Tolerance: Other (comment) (Amb distance limited by dizziness) Patient left: in chair;with call bell/phone within reach;with chair alarm set Nurse Communication: Mobility status PT Visit Diagnosis: Unsteadiness on feet (R26.81);Other abnormalities of gait and mobility (R26.89);Muscle weakness (generalized) (M62.81)    Time: 3491-7915 PT Time Calculation (min) (ACUTE ONLY): 42 min   Charges:   PT Evaluation $PT Eval Moderate Complexity: 1 Mod PT Treatments $Gait Training: 23-37 mins        [place] Pager [phone] Office [place] 01/22/2021, 12:53 PM

## 2021-01-22 NOTE — Progress Notes (Signed)
? ?  Inpatient Rehab Admissions Coordinator : ? ?Per therapy recommendations, patient was screened for CIR candidacy by Moria Brophy RN MSN.  At this time patient appears to be a potential candidate for CIR. I will place a rehab consult per protocol for full assessment. Please call me with any questions. ? ?Takiera Mayo RN MSN ?Admissions Coordinator ?[phone] ?  ?

## 2021-01-22 NOTE — Progress Notes (Signed)
4 Days Post-Op Procedure(s) (LRB): CORONARY ARTERY BYPASS GRAFTING (CABG) x 4  USING LEFT INTERNAL MAMMARY ARTERY AND LEFT ENDOSCOPIC GREATER SAPHENOUS VEIN CONDUITS (N/A) TRANSESOPHAGEAL ECHOCARDIOGRAM (TEE) (N/A) APPLICATION OF CELL SAVER ENDOVEIN HARVEST OF GREATER SAPHENOUS VEIN (Left) Subjective: Up in chair, c/o nausea  Objective: Vital signs in last 24 hours: Temp:  [98 F (36.7 C)-98.5 F (36.9 C)] 98 F (36.7 C) (12/05 2300) Pulse Rate:  [61-85] 65 (12/06 0600) Cardiac Rhythm: Normal sinus rhythm (12/06 0400) Resp:  [13-20] 17 (12/06 0600) BP: (127-169)/(58-83) 127/64 (12/06 0600) SpO2:  [90 %-98 %] 90 % (12/06 0600) Weight:  [87.6 kg] 87.6 kg (12/06 0500)  Hemodynamic parameters for last 24 hours:    Intake/Output from previous day: 12/05 0701 - 12/06 0700 In: -  Out: 2450 [Urine:2000; Chest Tube:450] Intake/Output this shift: No intake/output data recorded.  General appearance: alert, cooperative, and no distress Neurologic: mild psychomotor slowing Heart: regular rate and rhythm Lungs: diminished breath sounds bibasilar Abdomen: mildly distended, + BS  Lab Results: Recent Labs    01/20/21 0415 01/20/21 0420 01/22/21 0426  WBC 13.7*  --  14.6*  HGB 9.4* 8.8* 9.3*  HCT 28.8* 26.0* 31.3*  PLT 162  --  404*   BMET:  Recent Labs    01/20/21 0415 01/20/21 0420 01/22/21 0426  NA 141 141 146*  K 3.9 4.0 3.6  CL 104  --  106  CO2 25  --  30  GLUCOSE 196*  --  262*  BUN 49*  --  43*  CREATININE 1.11*  --  0.69  CALCIUM 8.3*  --  9.0    PT/INR: No results for input(s): LABPROT, INR in the last 72 hours. ABG    Component Value Date/Time   PHART 7.463 (H) 01/20/2021 0420   HCO3 26.9 01/20/2021 0420   TCO2 28 01/20/2021 0420   ACIDBASEDEF 9.6 (H) 01/12/2021 2122   O2SAT 58.4 01/22/2021 0426   CBG (last 3)  Recent Labs    01/21/21 1959 01/21/21 2355 01/22/21 0727  GLUCAP 232* 254* 276*    Assessment/Plan: S/P Procedure(s)  (LRB): CORONARY ARTERY BYPASS GRAFTING (CABG) x 4  USING LEFT INTERNAL MAMMARY ARTERY AND LEFT ENDOSCOPIC GREATER SAPHENOUS VEIN CONDUITS (N/A) TRANSESOPHAGEAL ECHOCARDIOGRAM (TEE) (N/A) APPLICATION OF CELL SAVER ENDOVEIN HARVEST OF GREATER SAPHENOUS VEIN (Left) POD # 4 NEURO- delirium improved CV- in Sr, co-ox 58  Hypertensive- start Norvasc, diurese RESP-  self extubated over the weekend IS, mobilize, diurese, nebs ID- on Zosyn for aspiration RENAL- creatinine normal, BUN mildly elevated  Diurese, supplement K ENDO- CBG poorly controlled  Was on high doses of insulin prior to admission  Increase daily insulin, continue SSI GI- NPO due to aspiration risks  TF started but at low rate SCD + enoxaparin for DVT prophylaxis Dc chest tubes Deconditioning- severe, mobilize  LOS: 9 days    Morgan Daniels 01/22/2021

## 2021-01-22 NOTE — Progress Notes (Signed)
NAME:  Morgan Daniels, MRN:  [id number], DOB:  Dec 19, 1960, LOS: 9 ADMISSION DATE:  01/13/2021, CONSULTATION DATE:  01/14/2021 REFERRING MD:  Marcelle Smiling, REASON FOR CONSULT:  ventilator assistance   History of Present Illness:  60 year old woman who presented to [facility] ED 01/12/2021 via EMS for acute respiratory failure and unresponsiveness.  PMH significant for PAD, HTN, HLD, current tobacco abuse, COPD, DM2.  Per ED documentation/EMS report, patient began having difficulty breathing and told her husband that she felt as if she was going to die.  Husband placed her on his home CPAP machine and contacted EMS.  On EMS arrival, patient was agonally breathing and hypoxic; transitioned to [place] airway. Patient never lost pulses. She was intubated in the ED. The patient did not meet STEMI criteria.  PE vs. PNA. Empiric cefepime/vancomycin was given. CTA negative for PE, CTH negative. The patient was extubated 11/27.  Patient later developed respiratory distress, was reintubated, CODE STEMI was called. In cath lab, found to have a 100% occluded RCA. IABP was placed. Transferred to [facility] to be managed by cardiology. Underwent CABG 12/2 (Dr. Roxan Hockey). IABP removed 12/3.  Pertinent  Medical History  PAD, HTN, HLD, current tobacco abuse, COPD, DM2, CVA, peptic ulcer  Significant Hospital Events: Including procedures, antibiotic start and stop dates in addition to other pertinent events   01/12/2021: Patient admitted to [facility] ICU with acute hypoxic respiratory failure requiring emergent intubation and mechanical ventilatory support secondary to pneumonia versus acute heart failure.  CTA negative for PE, CTh negative. 01/13/21: Patient extubated during the day, ECHO: LVEF 60-65%, no wall motion abnormalities, G1DD. cardiology planned for scheduled LHC. Patient then developed respiratory distress and was emergently re-intubated CODE STEMI called patient taken to cath lab, RCA 100% occluded > IABP placed emergently  and patient transferred to [facility] emergently. 11/28 TXR to [facility]. Cardiology primary. PCCM consulted 11/30 responded well to diuresis. EEG done 11/29 no seizure activity. Passed her SBT. Extubated. Diuretics continued. Still on IABP. Cultures from [facility] grew strep agalactiae abx narrowed to ampicillin w/ plan to complete total of 7 days.  Reintubated later in the day. 12/2 CABG 4  USING LEFT INTERNAL MAMMARY ARTERY AND LEFT ENDOSCOPIC GREATER SAPHENOUS VEIN CONDUITS - diffusely diseased targets but good LV function.  12/3 IABP removed 12/4 Self-extubated 12/5 Stable O2 needs on HFNC ([place]). Failed SLP eval, Cortrak today.   Interim History / Subjective:  No acute events overnight. Nausea better today. Wants to drink, hasn't been seen by SLP yet today.  Objective   Blood pressure 139/66, pulse 79, temperature 98 F (36.7 C), temperature source Oral, resp. rate (!) 22, height 5\' 6"  (1.676 m), weight 87.6 kg, SpO2 95 %.        Intake/Output Summary (Last 24 hours) at 01/22/2021 1311 Last data filed at 01/22/2021 1200 Gross per 24 hour  Intake 60 ml  Output 3015 ml  Net -2955 ml    Filed Weights   01/19/21 0515 01/20/21 0645 01/22/21 0500  Weight: 90.1 kg 93 kg 87.6 kg   Physical Examination: General: middle aged woman lying in bed in NAD HEENT: [place]/AT, eyes anicteric Neuro: awake, alert, moving all extremities CV: S1S2, RRR PULM: breathing comfortably on [place], no wheezing or rhales. Voice much stronger today. GI: soft, NT Extremities: minimal LE edema, no cyanosis or clubbing Skin: warm, dry, no rashes  Resolved Hospital Problem List:  Cardiogenic shock following CABG following non-STEMI Acute encephalopathy  Assessment & Plan:  Acute postoperative respiratory failure require mechanical ventilation  CAP due to Strep agalactiae multifocal pneumonia with possible aspiration pneumonitis (preoperative) COPD with active tobacco abuse Initial presentation with respiratory  distress/failure. [place] airway placed by EMS. Intubated on ED arrival 11/26, extubated 11/27. Reintubated 11/27-11/30, 11/30 - 12/4 in the setting of likely aspiration/ileus, remained intubated post-CABG. - con't weaning supplemental O2 to maintain SpO2 >90% - pulmonary hygiene - Con't long-acting bronchodilators-- able to switch to Anoro today. Will need LAMA-LABA at discharge (either Stiolot, Anoro, or Bevespi-- whichever is covered by insurance) -recommend ongoing diuresis as renal function allows -OOB mobility, pulmonary hygiene - Zosyn x 7-day course for PNA (end 12/7) - Will require tobacco cessation counseling; may be a good candidate for Chantix in the outpatient setting, would not initiate this while in ICU due to risk of nausea and psych symptoms.  Coronary artery disease, S/p CABG x4 (12/2) Code STEMI 11/27 with 100% occlusion of RCA. S/p CABG 12/2. -post-op management per TCTS; removing chest tubes today -Aspirin, plavix, atorvastatin daily  Afib perioperatively, not recurrent -con't amiodarone per TCTS -no plans for [facility] unless recurrent  Nausea Ileus resolved At risk for malnutrition Ileus contributing to increased aspiration risk. Failed bedside SLP eval 12/5. - Cortrak in place, ok to be removed when she can swallow and meet her nutritional needs -con't reglan, zofran PRN, simethicone PRN  Dysphagia -needs SLP evaluation> MBS planned  DM2 w/ hyperglycemia, uncontrolled - SSI PRN -agree with increasing long-acting insulin and TF coverage  History of HTN HLD - Continue metoprolol BID; have room to increase dose with BP, although HRs lower overnight. -agree with amlodipine -con't tele monitoring  Physical deconditioning -PT, OT, SLP -have encouraged OOB mobility as able  Husband updated at bedside today.  Best Practice (right click and "Reselect all SmartList Selections" daily)   Diet/type: tubefeeds DVT prophylaxis: LMWH GI prophylaxis: PPI Lines: Central  line and Arterial Line Foley:  Yes, and it is still needed Code Status:  full code Last date of multidisciplinary goals of care discussion [per primary]  Critical care time:    Julian Hy, DO 01/22/21 1:24 PM [facility]

## 2021-01-22 NOTE — Progress Notes (Signed)
      [address]       [place] [address]             [phone]      POD # 4 CABG  Stable day  BP 128/67   Pulse 79   Temp 98 F (36.7 C) (Oral)   Resp 16   Ht 5\' 6"  (1.676 m)   Wt 87.6 kg   LMP  (LMP Unknown) Comment: pt intubated  SpO2 95%   BMI 31.17 kg/m   Intake/Output Summary (Last 24 hours) at 01/22/2021 1912 Last data filed at 01/22/2021 1200 Gross per 24 hour  Intake 60 ml  Output 1895 ml  Net -1835 ml    Continues to slowly progress  [name], MD [facility] [phone]

## 2021-01-22 NOTE — Progress Notes (Signed)
Speech Language Pathology Treatment: Dysphagia  Patient Details Name: Morgan Daniels MRN: [id number] DOB: May 11, 1960 Today's Date: 01/22/2021 Time: 0254-2706 SLP Time Calculation (min) (ACUTE ONLY): 11 min  Assessment / Plan / Recommendation Clinical Impression  Pt was seen for f/u after clinical evaluation on previous date. Today her voice is subjectively louder and she believes that she is coughing less with ice chips, although question her awareness. Pt still has wet vocal quality and coughing after sips of thin liquids that is not immediately evident with ice chips and purees, although as trials continue she does have delayed throat clearing that begins. Overall she is showing mild signs of improvement, but risk for aspiration is still elevated. Recommend proceeding with MBS, which can be completed this afternoon. Until a closer evaluation of her oropharyngeal swallowing is performed, would continue to offer her a few ice chips, one at a time, after oral care.    HPI HPI: Pt is a 60 yo female who presented to the [facility] ED on 01/12/2021 via EMS due to acute respiratory failure and unresponsiveness. ETT 11/26-11/27; reintubated 11/27 with CODE STEMI and extubated 11/30; developed stridor and vomiting with concern for aspiration so reintubated 11/30 until self-extubated 12/4. Pt s/p CABG x4 on 12/2. PMH includes: PAD, HTN, HLD, current tobacco abuse, COPD, DM2, CVA, GERD, hiatal hernia, anxiety      SLP Plan  MBS      Recommendations for follow up therapy are one component of a multi-disciplinary discharge planning process, led by the attending physician.  Recommendations may be updated based on patient status, additional functional criteria and insurance authorization.    Recommendations  Diet recommendations: NPO;Other(comment) (few ice chips, given one at a time after oral care) Medication Administration: Via alternative means                Oral Care Recommendations: Oral care  QID Follow Up Recommendations:  (tba) Assistance recommended at discharge: Intermittent Supervision/Assistance SLP Visit Diagnosis: Dysphagia, unspecified (R13.10) Plan: MBS       GO                Osie Bond., M.A. [facility] Pager [phone] Office [phone]  01/22/2021, 11:30 AM

## 2021-01-22 NOTE — Progress Notes (Signed)
Modified Barium Swallow Progress Note  Patient Details  Name: Morgan Daniels MRN: [id number] Date of Birth: Jul 14, 1960  Today's Date: 01/22/2021  Modified Barium Swallow completed.  Full report located under Chart Review in the Imaging Section.  Brief recommendations include the following:  Clinical Impression  Pt presents with a moderate pharyngeal more than oral dysphagia, although she does have mildly reduced lingual propulsion with small amounts of lingual residue with solids. Pharyngeally she has reduced hyolaryngeal excursion, base of tongue retraction, and pharyngeal squeeze. Subsequently this results in incomplete epiglottic inversion and laryngeal vestibule closure, with possible post-extubation choices also impacting glottic closure given dysphonia. Residue increases as trials become thicker, leaving more diffuse residue with thicker liquids and solids. She performs a second swallow spontaneously that reduces but doesn't always eliminate this residue. Boluses enter into her airway before the swallow when she is not able to achieve adequate closure, aspirating thin liquids most consistently compared to other consistencies, although penetration is deep with thicker liquids and resulted in aspiration with honey thick liquids via cup x1 as well. A chin tuck does not increase airway protection or reduce residue. Pt has the most airway protection with purees and honey thick liquids by spoon, although risk for aspiration is still present given the amount of residue in her pharynx combined also with her mentation. Considering her recent cardiac surgery and repeated intubations, recommend initiating snacks only of purees and honey thick liquids by spoon to allow her to start more gradually trying POs to increase use of her swallowing musculature. SLP will also f/u for potential pharyngeal exercises.   Swallow Evaluation Recommendations       SLP Diet Recommendations: NPO;Alternative means -  temporary;Other (Comment) (pt can have small snacks of purees and honey thick liquids from the floor stock)   Liquid Administration via: Spoon   Medication Administration: Via alternative means   Supervision: Full supervision/cueing for compensatory strategies;Patient able to self feed   Compensations: Slow rate;Small sips/bites;Effortful swallow;Multiple dry swallows after each bite/sip   Postural Changes: Seated upright at 90 degrees   Oral Care Recommendations: Oral care QID   Other Recommendations: Have oral suction available    Osie Bond., M.A. [facility] Pager [phone] Office [phone]  01/22/2021,4:48 PM

## 2021-01-22 NOTE — Progress Notes (Addendum)
OT Cancellation Note  Patient Details Name: Morgan Daniels MRN: [id number] DOB: April 14, 1960   Cancelled Treatment:    Reason Eval/Treat Not Completed: Patient at procedure or test/ unavailable Currently off unit for swallow study. Will follow-up for OT eval as pt available and as schedule permits.  [name] 01/22/2021, 1:01 PM

## 2021-01-23 ENCOUNTER — Inpatient Hospital Stay (HOSPITAL_COMMUNITY): Payer: Medicare Other

## 2021-01-23 ENCOUNTER — Other Ambulatory Visit (INDEPENDENT_AMBULATORY_CARE_PROVIDER_SITE_OTHER): Payer: Self-pay | Admitting: Vascular Surgery

## 2021-01-23 ENCOUNTER — Other Ambulatory Visit (HOSPITAL_COMMUNITY): Payer: Self-pay

## 2021-01-23 ENCOUNTER — Telehealth: Payer: Self-pay | Admitting: Critical Care Medicine

## 2021-01-23 DIAGNOSIS — I70212 Atherosclerosis of native arteries of extremities with intermittent claudication, left leg: Secondary | ICD-10-CM

## 2021-01-23 DIAGNOSIS — Z95820 Peripheral vascular angioplasty status with implants and grafts: Secondary | ICD-10-CM

## 2021-01-23 LAB — CBC
HCT: 31.6 % — ABNORMAL LOW (ref 36.0–46.0)
Hemoglobin: 9.5 g/dL — ABNORMAL LOW (ref 12.0–15.0)
MCH: 26.8 pg (ref 26.0–34.0)
MCHC: 30.1 g/dL (ref 30.0–36.0)
MCV: 89 fL (ref 80.0–100.0)
Platelets: 429 10*3/uL — ABNORMAL HIGH (ref 150–400)
RBC: 3.55 MIL/uL — ABNORMAL LOW (ref 3.87–5.11)
RDW: 13.9 % (ref 11.5–15.5)
WBC: 14.1 10*3/uL — ABNORMAL HIGH (ref 4.0–10.5)
nRBC: 0 % (ref 0.0–0.2)

## 2021-01-23 LAB — GLUCOSE, CAPILLARY
Glucose-Capillary: 134 mg/dL — ABNORMAL HIGH (ref 70–99)
Glucose-Capillary: 122 mg/dL — ABNORMAL HIGH (ref 70–99)
Glucose-Capillary: 191 mg/dL — ABNORMAL HIGH (ref 70–99)
Glucose-Capillary: 174 mg/dL — ABNORMAL HIGH (ref 70–99)
Glucose-Capillary: 201 mg/dL — ABNORMAL HIGH (ref 70–99)
Glucose-Capillary: 201 mg/dL — ABNORMAL HIGH (ref 70–99)

## 2021-01-23 LAB — BASIC METABOLIC PANEL
Anion gap: 7 (ref 5–15)
BUN: 35 mg/dL — ABNORMAL HIGH (ref 6–20)
CO2: 32 mmol/L (ref 22–32)
Calcium: 8.8 mg/dL — ABNORMAL LOW (ref 8.9–10.3)
Chloride: 107 mmol/L (ref 98–111)
Creatinine, Ser: 0.65 mg/dL (ref 0.44–1.00)
GFR, Estimated: >60 mL/min (ref >60)
Glucose, Bld: 135 mg/dL — ABNORMAL HIGH (ref 70–99)
Potassium: 3.8 mmol/L (ref 3.5–5.1)
Sodium: 146 mmol/L — ABNORMAL HIGH (ref 135–145)

## 2021-01-23 LAB — COOXEMETRY PANEL
Carboxyhemoglobin: 1.3 % (ref 0.5–1.5)
Methemoglobin: 1 % (ref 0.0–1.5)
O2 Saturation: 61 %
Total hemoglobin: 9.7 g/dL — ABNORMAL LOW (ref 12.0–16.0)

## 2021-01-23 LAB — MAGNESIUM: Magnesium: 2.1 mg/dL (ref 1.7–2.4)

## 2021-01-23 LAB — PHOSPHORUS: Phosphorus: 3 mg/dL (ref 2.5–4.6)

## 2021-01-23 MED ORDER — SODIUM CHLORIDE 0.9% FLUSH
3.0000 mL | Freq: Two times a day (BID) | INTRAVENOUS | Status: DC
  Administered 2021-01-23 – 2021-01-25 (×4): 3 mL via INTRAVENOUS

## 2021-01-23 MED ORDER — FUROSEMIDE 10 MG/ML IJ SOLN
40.0000 mg | Freq: Once | INTRAMUSCULAR | Status: AC
  Administered 2021-01-23: 40 mg via INTRAVENOUS
  Filled 2021-01-23: qty 4

## 2021-01-23 MED ORDER — GABAPENTIN 600 MG PO TABS
600.0000 mg | ORAL_TABLET | Freq: Three times a day (TID) | ORAL | Status: DC
  Administered 2021-01-23 – 2021-01-25 (×7): 600 mg
  Filled 2021-01-23 (×7): qty 1

## 2021-01-23 MED ORDER — ~~LOC~~ CARDIAC SURGERY, PATIENT & FAMILY EDUCATION
Freq: Once | Status: AC
  Filled 2021-01-23: qty 1

## 2021-01-23 MED ORDER — POTASSIUM CHLORIDE 20 MEQ PO PACK: 40.0000 meq | PACK | Freq: Two times a day (BID) | ORAL | Status: DC

## 2021-01-23 MED ORDER — CHLORHEXIDINE GLUCONATE 0.12 % MT SOLN
15.0000 mL | Freq: Two times a day (BID) | OROMUCOSAL | Status: DC
  Administered 2021-01-23 – 2021-01-25 (×4): 15 mL via OROMUCOSAL
  Filled 2021-01-23 (×3): qty 15

## 2021-01-23 MED ORDER — AMLODIPINE BESYLATE 10 MG PO TABS
10.0000 mg | ORAL_TABLET | Freq: Every day | ORAL | Status: DC
  Administered 2021-01-23 – 2021-01-25 (×3): 10 mg
  Filled 2021-01-23 (×3): qty 1

## 2021-01-23 MED ORDER — FLUOXETINE HCL 20 MG PO CAPS
80.0000 mg | ORAL_CAPSULE | Freq: Every day | ORAL | Status: DC
  Administered 2021-01-23 – 2021-01-25 (×3): 80 mg
  Filled 2021-01-23 (×3): qty 4

## 2021-01-23 MED ORDER — SODIUM CHLORIDE 0.9 % IV SOLN: 250.0000 mL | INTRAVENOUS | Status: DC | PRN

## 2021-01-23 MED ORDER — POTASSIUM CHLORIDE 20 MEQ PO PACK
40.0000 meq | PACK | Freq: Two times a day (BID) | ORAL | Status: AC
  Administered 2021-01-23 (×2): 40 meq
  Filled 2021-01-23 (×2): qty 2

## 2021-01-23 MED ORDER — MAGNESIUM HYDROXIDE 400 MG/5ML PO SUSP: 30.0000 mL | Freq: Every day | ORAL | Status: DC | PRN

## 2021-01-23 MED ORDER — SODIUM CHLORIDE 0.9% FLUSH: 3.0000 mL | INTRAVENOUS | Status: DC | PRN

## 2021-01-23 MED ORDER — FOOD THICKENER (SIMPLYTHICK HONEY)
1.0000 | ORAL | Status: DC | PRN
  Administered 2021-01-23: 1 via ORAL

## 2021-01-23 MED ORDER — ORAL CARE MOUTH RINSE
15.0000 mL | Freq: Two times a day (BID) | OROMUCOSAL | Status: DC
  Administered 2021-01-24 – 2021-01-25 (×2): 15 mL via OROMUCOSAL

## 2021-01-23 MED FILL — Mannitol IV Soln 20%: INTRAVENOUS | Qty: 500 | Status: AC

## 2021-01-23 MED FILL — Electrolyte-R (PH 7.4) Solution: INTRAVENOUS | Qty: 4000 | Status: AC

## 2021-01-23 MED FILL — Sodium Bicarbonate IV Soln 8.4%: INTRAVENOUS | Qty: 50 | Status: AC

## 2021-01-23 MED FILL — Calcium Chloride Inj 10%: INTRAVENOUS | Qty: 10 | Status: AC

## 2021-01-23 MED FILL — Sodium Chloride IV Soln 0.9%: INTRAVENOUS | Qty: 2000 | Status: AC

## 2021-01-23 MED FILL — Heparin Sodium (Porcine) Inj 1000 Unit/ML: INTRAMUSCULAR | Qty: 10 | Status: AC

## 2021-01-23 MED FILL — Lidocaine HCl(Cardiac) IV PF Soln Pref Syr 100 MG/5ML (2%): INTRAVENOUS | Qty: 5 | Status: AC

## 2021-01-23 NOTE — Progress Notes (Signed)
Inpatient Rehabilitation Admissions Coordinator   I met at bedside with patient , spouse and daughter. We discussed goals and expectations of a possible Cir admit They are in agreement. I feel patient is a great candidate. I will follow and await medical clearance to d/c to Cir.  Danne Baxter, RN, MSN Rehab Admissions Coordinator [phone] 01/23/2021 12:41 PM

## 2021-01-23 NOTE — Progress Notes (Addendum)
      [address]       [place] [address]             [phone]      5 Days Post-Op Procedure(s) (LRB): CORONARY ARTERY BYPASS GRAFTING (CABG) x 4  USING LEFT INTERNAL MAMMARY ARTERY AND LEFT ENDOSCOPIC GREATER SAPHENOUS VEIN CONDUITS (N/A) TRANSESOPHAGEAL ECHOCARDIOGRAM (TEE) (N/A) APPLICATION OF CELL SAVER ENDOVEIN HARVEST OF GREATER SAPHENOUS VEIN (Left)  Subjective:  Patient sitting up in chair.  Doing okay.  Doesn't have specific complaints.  Objective: Vital signs in last 24 hours: Temp:  [98 F (36.7 C)-99 F (37.2 C)] 99 F (37.2 C) (12/07 0400) Pulse Rate:  [64-79] 72 (12/07 0500) Cardiac Rhythm: Normal sinus rhythm (12/07 0400) Resp:  [14-26] 18 (12/07 0500) BP: (113-151)/(62-82) 140/62 (12/07 0500) SpO2:  [80 %-96 %] 91 % (12/07 0500) Weight:  [86.1 kg] 86.1 kg (12/07 0500)  Intake/Output from previous day: 12/06 0701 - 12/07 0700 In: 60 [NG/GT:60] Out: 1325 [Urine:1325]  General appearance: alert, cooperative, and no distress Heart: regular rate and rhythm Lungs: clear to auscultation bilaterally Abdomen: soft, non-tender; bowel sounds normal; no masses,  no organomegaly Extremities: edema trace Wound: clean and dry  Lab Results: Recent Labs    01/22/21 0426 01/23/21 0457  WBC 14.6* 14.1*  HGB 9.3* 9.5*  HCT 31.3* 31.6*  PLT 404* 429*   BMET:  Recent Labs    01/22/21 0426 01/23/21 0457  NA 146* 146*  K 3.6 3.8  CL 106 107  CO2 30 32  GLUCOSE 262* 135*  BUN 43* 35*  CREATININE 0.69 0.65  CALCIUM 9.0 8.8*    PT/INR: No results for input(s): LABPROT, INR in the last 72 hours. ABG    Component Value Date/Time   PHART 7.463 (H) 01/20/2021 0420   HCO3 26.9 01/20/2021 0420   TCO2 28 01/20/2021 0420   ACIDBASEDEF 9.6 (H) 01/12/2021 2122   O2SAT 61.0 01/23/2021 0457   CBG (last 3)  Recent Labs    01/22/21 1927 01/22/21 2319 01/23/21 0422  GLUCAP 127* 62 134*    Assessment/Plan: S/P Procedure(s)  (LRB): CORONARY ARTERY BYPASS GRAFTING (CABG) x 4  USING LEFT INTERNAL MAMMARY ARTERY AND LEFT ENDOSCOPIC GREATER SAPHENOUS VEIN CONDUITS (N/A) TRANSESOPHAGEAL ECHOCARDIOGRAM (TEE) (N/A) APPLICATION OF CELL SAVER ENDOVEIN HARVEST OF GREATER SAPHENOUS VEIN (Left)  CV-maintaining NSR, on Amiodarone, Lopressor, Norvasc for BP control Pulm- no acute issues, CTs removed yesterday, CXR with questionable trace left lateral pneumothorax, continued atelectasis/pneumonic changes..continue IS Renal- creatinine has been stable at 0.65, weight is trending down, on Lasix, potassium Expected post operative blood los anemia, mild Hgb at 9.5 Dysphagia- remains NPO after MBS, cleared for honey thick liquids and pureed snacks DM-sugars controlled, continue current insulin regimen Deconditioning- PT recs CIR placement Dispo- patient stable in NSR, continues to have dysphagia, MBS yesterday doesn't clear patient for oral intake remains NPO, continue diuretics, continue PT/OT... patient could possibly transfer to 4E, CIR at time of discharge if insurance approval comes through   LOS: 10 days   Morgan Handler, PA-C 01/23/2021 Patient seen and examined, agree with above Able to take small amounts of PO, but not able to take full meals- continue TF CXR show bilateral pleural effusions- continue diuresis Continue nebs, pulmonary hygiene PT/OT Completed 7 day course of Zosyn, monitor Transfer to [place]. Morgan Hockey, MD [facility] [phone]

## 2021-01-23 NOTE — Progress Notes (Signed)
EVENING ROUNDS NOTE :     [address]       [place] [address]             [phone]                 5 Days Post-Op Procedure(s) (LRB): CORONARY ARTERY BYPASS GRAFTING (CABG) x 4  USING LEFT INTERNAL MAMMARY ARTERY AND LEFT ENDOSCOPIC GREATER SAPHENOUS VEIN CONDUITS (N/A) TRANSESOPHAGEAL ECHOCARDIOGRAM (TEE) (N/A) APPLICATION OF CELL SAVER ENDOVEIN HARVEST OF GREATER SAPHENOUS VEIN (Left)   Total Length of Stay:  LOS: 10 days  Events:   No events Comfortably in bed    BP 122/62   Pulse 74   Temp 99 F (37.2 C) (Oral)   Resp 15   Ht 5\' 6"  (1.676 m)   Wt 86.1 kg   LMP  (LMP Unknown) Comment: pt intubated  SpO2 94%   BMI 30.64 kg/m          sodium chloride 20 mL/hr at 01/18/21 1938   sodium chloride     sodium chloride Stopped (01/18/21 1715)   feeding supplement (VITAL 1.5 CAL) 50 mL/hr at 01/23/21 0835   lactated ringers     lactated ringers     lactated ringers Stopped (01/20/21 1431)    I/O last 3 completed shifts: In: 60 [NG/GT:60] Out: 2145 [Urine:1925; Chest Tube:220]   CBC Latest Ref Rng & Units 01/23/2021 01/22/2021 01/20/2021  WBC 4.0 - 10.5 K/uL 14.1(H) 14.6(H) -  Hemoglobin 12.0 - 15.0 g/dL 9.5(L) 9.3(L) 8.8(L)  Hematocrit 36.0 - 46.0 % 31.6(L) 31.3(L) 26.0(L)  Platelets 150 - 400 K/uL 429(H) 404(H) -    BMP Latest Ref Rng & Units 01/23/2021 01/22/2021 01/20/2021  Glucose 70 - 99 mg/dL 135(H) 262(H) -  BUN 6 - 20 mg/dL 35(H) 43(H) -  Creatinine 0.44 - 1.00 mg/dL 0.65 0.69 -  Sodium 135 - 145 mmol/L 146(H) 146(H) 141  Potassium 3.5 - 5.1 mmol/L 3.8 3.6 4.0  Chloride 98 - 111 mmol/L 107 106 -  CO2 22 - 32 mmol/L 32 30 -  Calcium 8.9 - 10.3 mg/dL 8.8(L) 9.0 -    ABG    Component Value Date/Time   PHART 7.463 (H) 01/20/2021 0420   PCO2ART 37.7 01/20/2021 0420   PO2ART 56 (L) 01/20/2021 0420   HCO3 26.9 01/20/2021 0420   TCO2 28 01/20/2021 0420   ACIDBASEDEF 9.6 (H) 01/12/2021 2122   O2SAT 61.0 01/23/2021 0457        Melodie Bouillon, MD 01/23/2021 5:22 PM

## 2021-01-23 NOTE — Telephone Encounter (Signed)
Please schedule [name] a follow up appointment at the [facility] Pulmonary office with any provider to establish care for COPD.  Julian Hy, DO 01/23/21 8:35 AM [facility]

## 2021-01-23 NOTE — Progress Notes (Signed)
Speech Language Pathology Treatment: Dysphagia  Patient Details Name: Morgan Daniels MRN: [id number] DOB: [birth year] Today's Date: 01/23/2021 Time: 7939-0300 SLP Time Calculation (min) (ACUTE ONLY): 11 min  Assessment / Plan / Recommendation Clinical Impression  Pt was sitting upright in her chair upon SLP arrival, consuming coffee thickened to honey thick consistency by RN. Pt was using a spoon as recommended and did recall rationale for current recommendations from MBS - at least in a general sense to reduce risk of aspiration. She needed Min cues throughout trials to use a second swallow. SLP also introduced pharyngeal exercises including effortful swallow, CTAR, and masako maneuver, of which she had the most difficulty completing the masako (only x1). Mod cues were needed to complete exercises more accurately. Would continue with current plan for now with the addition of these exercises. SLP will continue to follow for readiness to initiate more of a PO diet, but for now will continue snacks of purees and honey thick liquids by spoon. Would not offer ice chips at the same time as these consistencies - would wait at least 30 minutes and then provide oral care before giving any ice chips.    HPI HPI: Pt is a 60 yo female who presented to the [facility] ED on 01/12/2021 via EMS due to acute respiratory failure and unresponsiveness. ETT 11/26-11/27; reintubated 11/27 with CODE STEMI and extubated 11/30; developed stridor and vomiting with concern for aspiration so reintubated 11/30 until self-extubated 12/4. Pt s/p CABG x4 on 12/2. PMH includes: PAD, HTN, HLD, current tobacco abuse, COPD, DM2, CVA, GERD, hiatal hernia, anxiety      SLP Plan  Continue with current plan of care      Recommendations for follow up therapy are one component of a multi-disciplinary discharge planning process, led by the attending physician.  Recommendations may be updated based on patient status, additional functional criteria  and insurance authorization.    Recommendations  Diet recommendations: NPO;Other(comment) (pt can have small snacks of purees and honey thick liquids from floor stock) Liquids provided via: Teaspoon Medication Administration: Via alternative means Compensations: Slow rate;Small sips/bites;Effortful swallow;Multiple dry swallows after each bite/sip Postural Changes and/or Swallow Maneuvers: Seated upright 90 degrees                Oral Care Recommendations: Oral care QID Follow Up Recommendations: Acute inpatient rehab (3hours/day) Assistance recommended at discharge: Intermittent Supervision/Assistance SLP Visit Diagnosis: Dysphagia, oropharyngeal phase (R13.12) Plan: Continue with current plan of care       GO                Morgan Daniels., M.A. [facility] Pager [phone] Office [phone]  01/23/2021, 9:38 AM

## 2021-01-23 NOTE — Telephone Encounter (Signed)
See note below

## 2021-01-23 NOTE — Evaluation (Addendum)
Occupational Therapy Evaluation Patient Details Name: Morgan Daniels MRN: [id number] DOB: December 19, 1960 Today's Date: 01/23/2021   History of Present Illness Pt adm to [facility] 11/26 with repiratory failure and unresponsiveness. Pt intubated on admission 11/26. Extubated 11/27 developed respiratory distress and reintubated. Code STEMI called and pt cath showed severe 3 vessel disease and IABP placed. Pt transferred to [facility]. Pt extubated 11/30 and later reintubated on 11/30.  Pt underwent CABG x 4 on 12/2.  IABP removed 12/3. Pt self extubated 12/4. PMH - PAD, CVA, HTN, CKD, DM, IBS   Clinical Impression   PTA, pt lives with spouse and reports typically Independent in all ADLs, IADLs and mobility without AD. Pt presents now s/p procedures above with deficits in cardiopulmonary tolerance, cognition, strength, endurance and standing balance. Educated on sternal precautions for ADLs with pt requiring Min A for UB ADLs and up to Max A for LB ADLs. Pt benefits from reminders to adhere to precautions throughout session. Pt able to stand with Min A using RW today but reliant on external support to maintain standing balance with noted posterior sway placing pt at increased risk for falls. Based on high PLOF and potential for quick improvements, recommend CIR prior to DC home.   VSS on 5 L O2     Recommendations for follow up therapy are one component of a multi-disciplinary discharge planning process, led by the attending physician.  Recommendations may be updated based on patient status, additional functional criteria and insurance authorization.   Follow Up Recommendations  Acute inpatient rehab (3hours/day)    Assistance Recommended at Discharge Frequent or constant Supervision/Assistance  Functional Status Assessment  Patient has had a recent decline in their functional status and demonstrates the ability to make significant improvements in function in a reasonable and predictable amount of time.  Equipment  Recommendations  BSC/3in1;Other (comment) (Rolling walker)    Recommendations for Other Services Rehab consult     Precautions / Restrictions Precautions Precautions: Fall;Sternal;Other (comment) Precaution Booklet Issued: Yes (comment) Precaution Comments: monitor O2 (does not wear at baseline) Restrictions Weight Bearing Restrictions: No      Mobility Bed Mobility               General bed mobility comments: Pt up in chair    Transfers Overall transfer level: Needs assistance Equipment used: Rolling walker (2 wheels) Transfers: Sit to/from Stand Sit to Stand: Min assist           General transfer comment: Min A to rise from recliner, frequent cues to avoid pushing from armrests - eventually placed hands on RW to stand with momentum      Balance Overall balance assessment: Needs assistance Sitting-balance support: No upper extremity supported;Feet supported Sitting balance-Leahy Scale: Fair   Postural control: Posterior lean Standing balance support: Bilateral upper extremity supported;During functional activity;Reliant on assistive device for balance Standing balance-Leahy Scale: Poor Standing balance comment: reliant on UE support +external support due to posterior sway                           ADL either performed or assessed with clinical judgement   ADL Overall ADL's : Needs assistance/impaired Eating/Feeding: Sitting;Supervision/ safety   Grooming: Supervision/safety;Sitting;Oral care Grooming Details (indicate cue type and reason): use of swab and mouth rinse Upper Body Bathing: Minimal assistance;Sitting   Lower Body Bathing: Maximal assistance;Sit to/from stand   Upper Body Dressing : Minimal assistance;Sitting   Lower Body Dressing: Maximal assistance;Sit to/from stand  Lower Body Dressing Details (indicate cue type and reason): able to bend to reach B feet to pull up socks. will need assist in standing due to balance deficits      Toileting- Clothing Manipulation and Hygiene: Maximal assistance;Sit to/from stand         General ADL Comments: Pt benefits from cues for sternal precautions, improving sit to stand abilities though poor balance (even when supported in standing) with decreased proprioception of B feet     Vision Baseline Vision/History: 1 Wears glasses Ability to See in Adequate Light: 0 Adequate Patient Visual Report: No change from baseline Vision Assessment?: No apparent visual deficits     Perception     Praxis      Pertinent Vitals/Pain Pain Assessment: No/denies pain     Hand Dominance Right   Extremity/Trunk Assessment Upper Extremity Assessment Upper Extremity Assessment: Overall WFL for tasks assessed   Lower Extremity Assessment Lower Extremity Assessment: Defer to PT evaluation   Cervical / Trunk Assessment Cervical / Trunk Assessment: Normal   Communication Communication Communication: No difficulties   Cognition Arousal/Alertness: Awake/alert Behavior During Therapy: Flat affect Overall Cognitive Status: Impaired/Different from baseline Area of Impairment: Memory;Following commands;Safety/judgement;Awareness;Problem solving                     Memory: Decreased recall of precautions;Decreased short-term memory Following Commands: Follows one step commands with increased time Safety/Judgement: Decreased awareness of deficits;Decreased awareness of safety Awareness: Emergent Problem Solving: Slow processing;Difficulty sequencing;Requires verbal cues General Comments: requires cues to recall and implement sternal precautions, pleasant and follows all directions. slower processing and benefits from increased time for tasks     General Comments  92% on 5 L HFNC, BP WFL. HR WFL    Exercises     Shoulder Instructions      Home Living Family/patient expects to be discharged to:: Private residence Living Arrangements: Spouse/significant other Available  Help at Discharge: Family;Available 24 hours/day Type of Home: House Home Access: Stairs to enter [employer] of Steps: 3-4 Entrance Stairs-Rails: Right Home Layout: One level     Bathroom Shower/Tub: Occupational psychologist: Handicapped height     Home Equipment: [place] - single point;Shower seat - built in;Hand held shower head          Prior Functioning/Environment Prior Level of Function : Independent/Modified Independent;Driving             Mobility Comments: no use of AD for mobility ADLs Comments: reports Independence with ADLs, IADLs, driving and grocery shopping. Enjoys caring for dog (pitbull/sharpei mix)        OT Problem List: Decreased strength;Decreased activity tolerance;Impaired balance (sitting and/or standing);Decreased safety awareness;Decreased cognition;Decreased knowledge of use of DME or AE;Decreased knowledge of precautions;Cardiopulmonary status limiting activity      OT Treatment/Interventions: Self-care/ADL training;Therapeutic exercise;DME and/or AE instruction;Energy conservation;Therapeutic activities;Patient/family education;Balance training    OT Goals(Current goals can be found in the care plan section) Acute Rehab OT Goals Patient Stated Goal: get back to being independent OT Goal Formulation: With patient Time For Goal Achievement: 02/06/21 Potential to Achieve Goals: Good ADL Goals Pt Will Perform Grooming: with set-up;standing Pt Will Perform Lower Body Bathing: with supervision;sit to/from stand;with adaptive equipment Pt Will Perform Lower Body Dressing: with supervision;with adaptive equipment;sit to/from stand Pt Will Transfer to Toilet: with supervision;ambulating Additional ADL Goal #1: Pt to increase activity tolerance > 7 min during functional tasks to improve endurance for ADLs/IADLs  OT Frequency: Min 2X/week  Barriers to D/C:            Co-evaluation              AM-PAC OT "6 Clicks"  Daily Activity     Outcome Measure Help from another person eating meals?: A Little Help from another person taking care of personal grooming?: A Little Help from another person toileting, which includes using toliet, bedpan, or urinal?: A Lot Help from another person bathing (including washing, rinsing, drying)?: A Lot Help from another person to put on and taking off regular upper body clothing?: A Little Help from another person to put on and taking off regular lower body clothing?: A Lot 6 Click Score: 15   End of Session Equipment Utilized During Treatment: Rolling walker (2 wheels);Oxygen Nurse Communication: Mobility status  Activity Tolerance: Patient tolerated treatment well Patient left: in chair;with call bell/phone within reach;with chair alarm set  OT Visit Diagnosis: Unsteadiness on feet (R26.81);Other abnormalities of gait and mobility (R26.89);Muscle weakness (generalized) (M62.81)                Time: 4854-6270 OT Time Calculation (min): 24 min Charges:  OT General Charges $OT Visit: 1 Visit OT Evaluation $OT Eval Moderate Complexity: 1 Mod OT Treatments $Self Care/Home Management : 8-22 mins  Malachy Chamber, OTR/L Acute Rehab Services Office: [phone]   [name] 01/23/2021, 12:05 PM

## 2021-01-23 NOTE — TOC Benefit Eligibility Note (Signed)
Patient Teacher, English as a foreign language completed.    The patient is currently admitted and upon discharge could be taking Bevespi Aerospher 9-4.8 mcg.  The current 30 day co-pay is, $21.99.   The patient is currently admitted and upon discharge could be taking Anoro Ellipta.  The current 30 day co-pay is, $24.18.   The patient is currently admitted and upon discharge could be taking Stiolot.  Not on Formulary  The patient is insured through [place] Patient Advocate Specialist [place] Patient Advocate Team Direct Number: [phone]  Fax: [phone]

## 2021-01-23 NOTE — TOC Initial Note (Signed)
Transition of Care [facility]) - Initial/Assessment Note    Patient Details  Name: Morgan Daniels MRN: [id number] Date of Birth: December 11, 1960  [facility] CM/SW Contact:    Bethena Roys, RN Phone Number: 01/23/2021, 1:07 PM  Clinical Narrative:  Inpatient Rehab Admissions Coordinator is following the patient for possible CIR admission. Prior to arrival patient was from home with spouse. Spouse is asking for FMLA assistance. Case Manager did call the TCTS office and a Registered Nurse is on staff at the office that can assist with the paperwork. Case Manager provided the daughter with the address and phone number. Case Manager will continue to follow for additional disposition needs.                Expected Discharge Plan: IP Rehab Facility Barriers to Discharge: Continued Medical Work up   Patient Goals and CMS Choice Patient states their goals for this hospitalization and ongoing recovery are:: to go to inpatient rehab   Choice offered to / list presented to : NA  Expected Discharge Plan and Services Expected Discharge Plan: [place] In-house Referral: NA Discharge Planning Services: CM Consult Post Acute Care Choice: IP Rehab Living arrangements for the past 2 months: Single Family Home                  Prior Living Arrangements/Services Living arrangements for the past 2 months: Single Family Home Lives with:: Spouse, Self Patient language and need for interpreter reviewed:: Yes        Need for Family Participation in Patient Care: Yes (Comment) Care giver support system in place?: Yes (comment)   Criminal Activity/Legal Involvement Pertinent to Current Situation/Hospitalization: No - Comment as needed  Activities of Daily Living Home Assistive Devices/Equipment: None ADL Screening (condition at time of admission) Patient's cognitive ability adequate to safely complete daily activities?: Yes Is the patient deaf or have difficulty hearing?:  No Does the patient have difficulty seeing, even when wearing glasses/contacts?: No Does the patient have difficulty concentrating, remembering, or making decisions?: No Patient able to express need for assistance with ADLs?: Yes Does the patient have difficulty dressing or bathing?: No Independently performs ADLs?: Yes (appropriate for developmental age) Does the patient have difficulty walking or climbing stairs?: No Weakness of Legs: Left (post vascular surgery) Weakness of Arms/Hands: None  Emotional Assessment Appearance:: Appears stated age Attitude/Demeanor/Rapport: Engaged Affect (typically observed): Appropriate Orientation: : Oriented to Situation, Oriented to  Time, Oriented to Place, Oriented to Self Alcohol / Substance Use: Not Applicable Psych Involvement: No (comment)  Admission diagnosis:  Cardiogenic shock ([place]) [R57.0] S/P CABG x 4 [Z95.1] Patient Active Problem List   Diagnosis Date Noted   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   Encephalopathy acute    Acute pulmonary edema ([place])    Community acquired pneumonia    Acute respiratory failure with hypoxia and hypercapnia ([place]) 01/13/2021   NSTEMI (non-ST elevated myocardial infarction) ([place]) 01/13/2021   T2DM (type 2 diabetes mellitus) ([place]) 01/13/2021   Hypercholesteremia 01/13/2021   Tobacco abuse 01/13/2021   Cardiogenic shock ([place]) 01/13/2021   Acute respiratory failure ([place]) 01/12/2021   PCP:  System, Provider Not In Pharmacy:   [id number] - [place], [place] - [address] [place] [address] Phone: [phone] Fax: [phone]  Readmission Risk Interventions No flowsheet data found.

## 2021-01-24 ENCOUNTER — Encounter (HOSPITAL_COMMUNITY): Payer: Self-pay | Admitting: Thoracic Surgery (Cardiothoracic Vascular Surgery)

## 2021-01-24 ENCOUNTER — Encounter (INDEPENDENT_AMBULATORY_CARE_PROVIDER_SITE_OTHER): Payer: Medicare Other | Admitting: Nurse Practitioner

## 2021-01-24 ENCOUNTER — Inpatient Hospital Stay (HOSPITAL_COMMUNITY): Payer: Medicare Other

## 2021-01-24 ENCOUNTER — Other Ambulatory Visit: Payer: Self-pay

## 2021-01-24 ENCOUNTER — Encounter (INDEPENDENT_AMBULATORY_CARE_PROVIDER_SITE_OTHER): Payer: Medicare Other

## 2021-01-24 DIAGNOSIS — Z20822 Contact with and (suspected) exposure to covid-19: Secondary | ICD-10-CM | POA: Diagnosis not present

## 2021-01-24 LAB — CBC
HCT: 32.3 % — ABNORMAL LOW (ref 36.0–46.0)
Hemoglobin: 10.2 g/dL — ABNORMAL LOW (ref 12.0–15.0)
MCH: 27.5 pg (ref 26.0–34.0)
MCHC: 31.6 g/dL (ref 30.0–36.0)
MCV: 87.1 fL (ref 80.0–100.0)
Platelets: 488 10*3/uL — ABNORMAL HIGH (ref 150–400)
RBC: 3.71 MIL/uL — ABNORMAL LOW (ref 3.87–5.11)
RDW: 13.9 % (ref 11.5–15.5)
WBC: 16.9 10*3/uL — ABNORMAL HIGH (ref 4.0–10.5)
nRBC: 0 % (ref 0.0–0.2)

## 2021-01-24 LAB — GLUCOSE, CAPILLARY
Glucose-Capillary: 182 mg/dL — ABNORMAL HIGH (ref 70–99)
Glucose-Capillary: 167 mg/dL — ABNORMAL HIGH (ref 70–99)
Glucose-Capillary: 138 mg/dL — ABNORMAL HIGH (ref 70–99)
Glucose-Capillary: 137 mg/dL — ABNORMAL HIGH (ref 70–99)
Glucose-Capillary: 103 mg/dL — ABNORMAL HIGH (ref 70–99)

## 2021-01-24 LAB — BASIC METABOLIC PANEL
Anion gap: 7 (ref 5–15)
BUN: 25 mg/dL — ABNORMAL HIGH (ref 6–20)
CO2: 29 mmol/L (ref 22–32)
Calcium: 8.8 mg/dL — ABNORMAL LOW (ref 8.9–10.3)
Chloride: 102 mmol/L (ref 98–111)
Creatinine, Ser: 0.74 mg/dL (ref 0.44–1.00)
GFR, Estimated: >60 mL/min (ref >60)
Glucose, Bld: 200 mg/dL — ABNORMAL HIGH (ref 70–99)
Potassium: 4.4 mmol/L (ref 3.5–5.1)
Sodium: 138 mmol/L (ref 135–145)

## 2021-01-24 MED ORDER — BACITRACIN ZINC 500 UNIT/GM EX OINT
TOPICAL_OINTMENT | Freq: Every day | CUTANEOUS | Status: DC
  Filled 2021-01-24: qty 28.4

## 2021-01-24 MED FILL — Potassium Chloride Inj 2 mEq/ML: INTRAVENOUS | Qty: 40 | Status: AC

## 2021-01-24 MED FILL — Magnesium Sulfate Inj 50%: INTRAMUSCULAR | Qty: 10 | Status: AC

## 2021-01-24 MED FILL — Heparin Sodium (Porcine) Inj 1000 Unit/ML: INTRAMUSCULAR | Qty: 5000 | Status: AC

## 2021-01-24 MED FILL — Heparin Sodium (Porcine) Inj 1000 Unit/ML: Qty: 1000 | Status: AC

## 2021-01-24 NOTE — Progress Notes (Incomplete)
Nutrition Follow-up  DOCUMENTATION CODES:   Not applicable  INTERVENTION:  ***   NUTRITION DIAGNOSIS:   Inadequate oral intake related to acute illness as evidenced by NPO status.  ***  GOAL:   Patient will meet greater than or equal to 90% of their needs  ***  MONITOR:   Vent status, Labs, TF tolerance, Weight trends  REASON FOR ASSESSMENT:   Ventilator, Consult Enteral/tube feeding initiation and management  ASSESSMENT:   60 yo female admitted with acute respiratory failure secondary to acute pulmonary edema with acute decompensated heart failure, NSTEMI, cardiogenic shock with IABP. PMH includes HTN, DM, HLD, hx peptic ulcer, COPD, CAD.  11/27 - IABP placed, intubated 11/30 - extubated, reintubated post aspiration event from vomiting 12/02 - CABG x 4 12/03 - IABP removed 12/04 - self-extubated 12/05 - Cortrak placed (tip gastric), tube feeds initiated 12/06 - MBS completed with recommendations for NPO 12/08 - Cortrak removed during a fall  Admit weight: 86.8 kg Current weight: 86 kg  Medications reviewed and include: SSI, novolog 6 units q 4 hours, semglee 40 units daily, protonix  Labs reviewed: BUN 25, WBC 16.9 CBG's: 167-201 x 24 hours  UOP: 2050 ml x 24 hours I/O's: -6.3 L since admit  Diet Order:   Diet Order     None       EDUCATION NEEDS:   Not appropriate for education at this time  Skin:  Skin Assessment: Skin Integrity Issues: Incisions: chest, L leg  Last BM:  01/23/21 medium type 6  Height:   Ht Readings from Last 1 Encounters:  01/17/21 5\' 6"  (1.676 m)    Weight:   Wt Readings from Last 1 Encounters:  01/24/21 86 kg    Ideal Body Weight:  59 kg  BMI:  Body mass index is 30.6 kg/m.  Estimated Nutritional Needs:   Kcal:  1800-2000 kcals  Protein:  90-110 g  Fluid:  >/= 1.5 L    Gustavus Bryant, MS, RD, LDN Inpatient Clinical Dietitian Please see AMiON for contact information.

## 2021-01-24 NOTE — Progress Notes (Addendum)
At 4:27, RN noticed patient was off the monitor and went to assess, found patient down on the floor. She states she was trying to go to the bathroom even though purewick was in place.  Patient denies injury, A&Ox4, and able to move all extremities.  Coretrak was removed during fall.  Patient assisted back to bed by nursing staff maintaining sternal precautions, VSS. Sternal incision clean, dry and intact. Dr. Kipp Brood notified. No new orders.  Patient is now resting comfortably in bed with bed alarm on. Patient reeducated to call for help before getting up alone.  She states understanding of this education and states she was "just hard-headed".

## 2021-01-24 NOTE — Telephone Encounter (Signed)
Melissa will contact patient to schedule consult.  Discussed with Melissa.

## 2021-01-24 NOTE — H&P (Signed)
Physical Medicine and Rehabilitation Admission H&P    Functional deficits secondary to recent critical illness, open heart surgery  HPI: Morgan Daniels is a 60 year old female who presented to [facility] emergency department on January 12, 2021 via EMS for management of acute respiratory failure and unresponsiveness.  She was admitted to critical care medicine and required intubation and mechanical ventilation.  Further work-up revealed ST depressions in V leads on EKG and serial troponins at approximately 3000.  Heparin infusion for non-ST elevation MI was initiated.  She was able to be weaned and extubated but developed respiratory distress again and was reintubated.  Cardiology was consulted and echocardiography performed revealed an estimated ejection fraction of 60 to 65%.  They assessed her as likely having flash pulmonary edema related to her acute MI.  She was taken to the cardiac catheterization lab and underwent left heart catheterization demonstrating severe three-vessel coronary artery disease and mildly reduced left ventricular function with estimated LV ejection fraction of 40%.  Intra-aortic balloon pump was placed.  She was then transferred to [facility] for further evaluation and management of her three-vessel coronary artery disease.  Critical care medicine was consulted.    Cardiothoracic surgery/Dr. Roxan Hockey was consulted.  Urgent coronary artery bypass graft surgery was recommended.  She remained intubated, mechanically ventilated and sedated.  Her illness was complicated by hypoxic respiratory failure>> multifactorial.  She developed multifocal pneumonia with possible aspiration.  Antibiotic course was completed.  She underwent three-vessel coronary artery bypass grafting on January 18, 2021 by Dr. Roxan Hockey.   Her IABP was removed on POD 1. She self-extubated on POD 2.  She underwent SLP evaluation on December 22, 2020 with findings of  moderate aspiration risk, moderate pharyngeal dysphagia.  She was made NPO.  Cortrak was placed and she was receiving Prosource twice daily.  The patient had a fall without acute injury on January 23, 2021 and Cortrak was inadvertently dislodged.  Follow-up speech therapy swallowing evaluation she was permitted to have small snacks of pures and honey thick liquid awaiting plan to replace nasogastric tube.. She is maintained on Plavix, aspirin and statin.  She is receiving Lovenox 40 mg daily for DVT prophylaxis. She will need inpatient physical medicine and rehabilitation services for functional deficits secondary to recent critical illness, open heart surgery.  Significant past medical history includes tobacco use, COPD, diabetes mellitus, hypertension, hyperlipidemia, prior stroke, gastroesophageal reflux disease and hiatal hernia. No history of renal disease.  Prior to admission, the patient lived independently at home with her husband.  Requiring minimal assist with upper extremity and maximal assist for lower extremity ADLs. Review of Systems  Constitutional:  Negative for chills and fever.  HENT:  Negative for hearing loss.   Eyes:  Negative for blurred vision and double vision.  Respiratory:  Positive for shortness of breath. Negative for cough.   Cardiovascular:  Positive for chest pain, palpitations and leg swelling.  Gastrointestinal:  Positive for constipation. Negative for heartburn, nausea and vomiting.       GERD  Genitourinary:  Negative for dysuria, flank pain and hematuria.  Musculoskeletal:  Positive for joint pain and myalgias.  Skin:  Negative for rash.  Psychiatric/Behavioral:  The patient has insomnia.   All other systems reviewed and are negative. Past Medical History:  Diagnosis Date   Anxiety    Bilateral carotid artery stenosis 2014   Chronic kidney disease 2017   Current smoker    CVA (cerebral vascular accident) ([place])  2013   Depression    Diverticulosis     Fatty liver    Fibromyalgia    GERD (gastroesophageal reflux disease)    Hiatal hernia    Hypercholesteremia    Hypertension    IBS (irritable bowel syndrome)    PAD (peripheral artery disease) ([facility])    Peptic ulcer    T2DM (type 2 diabetes mellitus) ([place])    Past Surgical History:  Procedure Laterality Date   CORONARY ARTERY BYPASS GRAFT N/A 01/18/2021   Procedure: CORONARY ARTERY BYPASS GRAFTING (CABG) x 4  USING LEFT INTERNAL MAMMARY ARTERY AND LEFT ENDOSCOPIC GREATER SAPHENOUS VEIN CONDUITS;  Surgeon: [name], MD;  Location: [place];  Service: Open Heart Surgery;  Laterality: N/A;   CORONARY/GRAFT ACUTE MI REVASCULARIZATION N/A 01/13/2021   Procedure: Coronary/Graft Acute MI Revascularization;  Surgeon: Isaias Cowman, MD;  Location: [place] [facility];  Service: Cardiovascular;  Laterality: N/A;   ENDOVEIN HARVEST OF GREATER SAPHENOUS VEIN Left 01/18/2021   Procedure: ENDOVEIN HARVEST OF GREATER SAPHENOUS VEIN;  Surgeon: [name], MD;  Location: [place];  Service: Open Heart Surgery;  Laterality: Left;   IABP INSERTION Right 01/13/2021   Procedure: IABP Insertion;  Surgeon: Isaias Cowman, MD;  Location: [place] [facility];  Service: Cardiovascular;  Laterality: Right;   LEFT HEART CATH AND CORONARY ANGIOGRAPHY N/A 01/13/2021   Procedure: LEFT HEART CATH AND CORONARY ANGIOGRAPHY;  Surgeon: Isaias Cowman, MD;  Location: [place] [facility];  Service: Cardiovascular;  Laterality: N/A;   TEE WITHOUT CARDIOVERSION N/A 01/18/2021   Procedure: TRANSESOPHAGEAL ECHOCARDIOGRAM (TEE);  Surgeon: [name], MD;  Location: [place];  Service: Open Heart Surgery;  Laterality: N/A;   History reviewed. No pertinent family history. Social History:  reports that she has been smoking cigarettes. She has been exposed to tobacco smoke. She has never used smokeless tobacco. No history on file for alcohol use and drug use. Allergies:  Allergies   Allergen Reactions   Simvastatin     Diarrhea, nausea,vomiting   Lactose Intolerance (Gi) Diarrhea   Morphine And Related Itching   Latex    Medications Prior to Admission  Medication Sig Dispense Refill   aspirin EC 81 MG tablet Take 81 mg by mouth daily. Swallow whole.     clopidogrel (PLAVIX) 75 MG tablet Take 75 mg by mouth daily.     dicyclomine (BENTYL) 10 MG capsule Take 10 mg by mouth 3 (three) times daily before meals.     FLUoxetine (PROZAC) 40 MG capsule Take 80 mg by mouth daily.     gabapentin (NEURONTIN) 600 MG tablet Take 600 mg by mouth 3 (three) times daily.     gemfibrozil (LOPID) 600 MG tablet Take 600 mg by mouth 2 (two) times daily.     hydrochlorothiazide (HYDRODIURIL) 12.5 MG tablet Take 12.5 mg by mouth daily.     insulin degludec (TRESIBA FLEXTOUCH) 200 UNIT/ML FlexTouch Pen Inject 80 Units into the skin at bedtime.     metoprolol succinate (TOPROL-XL) 50 MG 24 hr tablet Take 50 mg by mouth daily.     NOVOLOG FLEXPEN 100 UNIT/ML FlexPen 20-26 Units 3 (three) times daily with meals.     rosuvastatin (CRESTOR) 10 MG tablet Take 10 mg by mouth daily.     tiZANidine (ZANAFLEX) 4 MG tablet Take 4 mg by mouth 2 (two) times daily as needed for muscle spasms (pain).     traMADol (ULTRAM) 50 MG tablet Take 50 mg by mouth 3 (three)  times daily as needed for moderate pain.      Drug Regimen Review Drug regimen was reviewed and remains appropriate with no significant issues identified  Home: Home Living Family/patient expects to be discharged to:: Private residence Living Arrangements: Spouse/significant other Available Help at Discharge: Family, Available 24 hours/day Type of Home: House Home Access: Stairs to enter [employer] of Steps: 3-4 Entrance Stairs-Rails: Right Home Layout: One level Bathroom Shower/Tub: Multimedia programmer: Handicapped height Home Equipment: Radio producer - single point, Civil engineer, contracting - built in, Engineer, manufacturing held shower head    Functional History: Prior Function Prior Level of Function : Independent/Modified Independent, Driving Mobility Comments: no use of AD for mobility ADLs Comments: reports Independence with ADLs, IADLs, driving and grocery shopping. Enjoys caring for dog (pitbull/sharpei mix)  Functional Status:  Mobility: Bed Mobility General bed mobility comments: Pt up in chair Transfers Overall transfer level: Needs assistance Equipment used: Rolling walker (2 wheels) Transfers: Sit to/from Stand Sit to Stand: Min assist Bed to/from chair/wheelchair/BSC transfer type:: Step pivot Step pivot transfers: Mod assist General transfer comment: Min A to rise from recliner, frequent cues to avoid pushing from armrests - eventually placed hands on RW to stand with momentum Ambulation/Gait Ambulation/Gait assistance: Min assist, +2 safety/equipment Gait Distance (Feet): 15 Feet Assistive device: Rollator (4 wheels) Gait Pattern/deviations: Step-through pattern, Decreased stride length, Trunk flexed, Narrow base of support General Gait Details: Assist for balance and support. Verbal cues to widen base of support with pt almost stepping on her own feet. Gait velocity: decr Gait velocity interpretation: <1.31 ft/sec, indicative of household ambulator    ADL: ADL Overall ADL's : Needs assistance/impaired Eating/Feeding: Sitting, Supervision/ safety Grooming: Supervision/safety, Sitting, Oral care Grooming Details (indicate cue type and reason): use of swab and mouth rinse Upper Body Bathing: Minimal assistance, Sitting Lower Body Bathing: Maximal assistance, Sit to/from stand Upper Body Dressing : Minimal assistance, Sitting Lower Body Dressing: Maximal assistance, Sit to/from stand Lower Body Dressing Details (indicate cue type and reason): able to bend to reach B feet to pull up socks. will need assist in standing due to balance deficits Toileting- Clothing Manipulation and Hygiene: Maximal  assistance, Sit to/from stand General ADL Comments: Pt benefits from cues for sternal precautions, improving sit to stand abilities though poor balance (even when supported in standing) with decreased proprioception of B feet  Cognition: Cognition Overall Cognitive Status: Impaired/Different from baseline Orientation Level: Oriented X4 Cognition Arousal/Alertness: Awake/alert Behavior During Therapy: Flat affect Overall Cognitive Status: Impaired/Different from baseline Area of Impairment: Memory, Following commands, Safety/judgement, Awareness, Problem solving Memory: Decreased recall of precautions, Decreased short-term memory Following Commands: Follows one step commands with increased time Safety/Judgement: Decreased awareness of deficits, Decreased awareness of safety Awareness: Emergent Problem Solving: Slow processing, Difficulty sequencing, Requires verbal cues General Comments: requires cues to recall and implement sternal precautions, pleasant and follows all directions. slower processing and benefits from increased time for tasks  Physical Exam: Blood pressure (!) 148/70, pulse 86, temperature 98.6 F (37 C), temperature source Oral, resp. rate 14, height _0  (1.676 m), weight 86 kg, SpO2 93 %. Physical Exam Constitutional:      General: She is not in acute distress.    Appearance: Normal appearance.  HENT:     Head: Normocephalic and atraumatic.     Comments: Cortrak tube in place, O2 via [place]    Nose: Nose normal.     Mouth/Throat:     Mouth: Mucous membranes are moist.  Pharynx: Oropharynx is clear.  Eyes:     Extraocular Movements: Extraocular movements intact.     Conjunctiva/sclera: Conjunctivae normal.     Pupils: Pupils are equal, round, and reactive to light.  Cardiovascular:     Rate and Rhythm: Normal rate and regular rhythm.     Heart sounds: No murmur heard.   No gallop.  Pulmonary:     Effort: Pulmonary effort is normal. No respiratory distress.      Breath sounds: No wheezing.  Abdominal:     General: Bowel sounds are normal. There is no distension.     Palpations: Abdomen is soft.  Musculoskeletal:        General: Tenderness (chest wall tender) present. Normal range of motion.     Cervical back: Normal range of motion.  Skin:    Comments: Midline chest incision clean and dry.  Neurological:     Mental Status: She is alert.     Comments: Patient is alert.  Sitting up in chair.  Makes eye contact with examiner.  Provides name and age.  Follows simple commands. Alert and oriented x 3. Normal insight and awareness. Intact Memory. Normal language and speech. Strong voice, no dysarthria. Cranial nerve exam unremarkable. UE 4/5 prox to distal. LE: 3/5 HF, 4-/5 KE, 4/5 ADF/PF. Sensory exam normal for light touch and pain in all 4 limbs. No limb ataxia or cerebellar signs. No abnormal tone appreciated.    Psychiatric:        Mood and Affect: Mood normal.        Behavior: Behavior normal.    Results for orders placed or performed during the hospital encounter of 01/13/21 (from the past 48 hour(s))  Glucose, capillary     Status: Abnormal   Collection Time: 01/22/21  2:14 PM  Result Value Ref Range   Glucose-Capillary 220 (H) 70 - 99 mg/dL    Comment: Glucose reference range applies only to samples taken after fasting for at least 8 hours.  Glucose, capillary     Status: Abnormal   Collection Time: 01/22/21  4:00 PM  Result Value Ref Range   Glucose-Capillary 185 (H) 70 - 99 mg/dL    Comment: Glucose reference range applies only to samples taken after fasting for at least 8 hours.  Glucose, capillary     Status: Abnormal   Collection Time: 01/22/21  7:27 PM  Result Value Ref Range   Glucose-Capillary 127 (H) 70 - 99 mg/dL    Comment: Glucose reference range applies only to samples taken after fasting for at least 8 hours.  Glucose, capillary     Status: None   Collection Time: 01/22/21 11:19 PM  Result Value Ref Range    Glucose-Capillary 86 70 - 99 mg/dL    Comment: Glucose reference range applies only to samples taken after fasting for at least 8 hours.  Glucose, capillary     Status: Abnormal   Collection Time: 01/23/21  4:22 AM  Result Value Ref Range   Glucose-Capillary 134 (H) 70 - 99 mg/dL    Comment: Glucose reference range applies only to samples taken after fasting for at least 8 hours.  .Cooxemetry Panel (carboxy, met, total hgb, O2 sat)     Status: Abnormal   Collection Time: 01/23/21  4:57 AM  Result Value Ref Range   Total hemoglobin 9.7 (L) 12.0 - 16.0 g/dL   O2 Saturation 61.0 %   Carboxyhemoglobin 1.3 0.5 - 1.5 %   Methemoglobin 1.0 0.0 - 1.5 %  Comment: Performed at [facility], [place] [address] [place], [place] [address]  CBC     Status: Abnormal   Collection Time: 01/23/21  4:57 AM  Result Value Ref Range   WBC 14.1 (H) 4.0 - 10.5 K/uL   RBC 3.55 (L) 3.87 - 5.11 MIL/uL   Hemoglobin 9.5 (L) 12.0 - 15.0 g/dL   HCT 31.6 (L) 36.0 - 46.0 %   MCV 89.0 80.0 - 100.0 fL   MCH 26.8 26.0 - 34.0 pg   MCHC 30.1 30.0 - 36.0 g/dL   RDW 13.9 11.5 - 15.5 %   Platelets 429 (H) 150 - 400 K/uL   nRBC 0.0 0.0 - 0.2 %    Comment: Performed at [facility], [place] [address] [place], [place] [address]  Basic metabolic panel     Status: Abnormal   Collection Time: 01/23/21  4:57 AM  Result Value Ref Range   Sodium 146 (H) 135 - 145 mmol/L   Potassium 3.8 3.5 - 5.1 mmol/L   Chloride 107 98 - 111 mmol/L   CO2 32 22 - 32 mmol/L   Glucose, Bld 135 (H) 70 - 99 mg/dL    Comment: Glucose reference range applies only to samples taken after fasting for at least 8 hours.   BUN 35 (H) 6 - 20 mg/dL   Creatinine, Ser 0.65 0.44 - 1.00 mg/dL   Calcium 8.8 (L) 8.9 - 10.3 mg/dL   GFR, Estimated >60 >60 mL/min    Comment: (NOTE) Calculated using the CKD-EPI Creatinine Equation (2021)    Anion gap 7 5 - 15    Comment: Performed at [facility] [address], [place], [place] [address]   Magnesium     Status: None   Collection Time: 01/23/21  4:57 AM  Result Value Ref Range   Magnesium 2.1 1.7 - 2.4 mg/dL    Comment: Performed at [facility] [address] [place], [place] [address]  Phosphorus     Status: None   Collection Time: 01/23/21  4:57 AM  Result Value Ref Range   Phosphorus 3.0 2.5 - 4.6 mg/dL    Comment: Performed at [facility] [address] [place], [place] [address]  Glucose, capillary     Status: Abnormal   Collection Time: 01/23/21  8:26 AM  Result Value Ref Range   Glucose-Capillary 122 (H) 70 - 99 mg/dL    Comment: Glucose reference range applies only to samples taken after fasting for at least 8 hours.  Glucose, capillary     Status: Abnormal   Collection Time: 01/23/21 11:08 AM  Result Value Ref Range   Glucose-Capillary 191 (H) 70 - 99 mg/dL    Comment: Glucose reference range applies only to samples taken after fasting for at least 8 hours.  Glucose, capillary     Status: Abnormal   Collection Time: 01/23/21  4:29 PM  Result Value Ref Range   Glucose-Capillary 174 (H) 70 - 99 mg/dL    Comment: Glucose reference range applies only to samples taken after fasting for at least 8 hours.  Glucose, capillary     Status: Abnormal   Collection Time: 01/23/21  8:33 PM  Result Value Ref Range   Glucose-Capillary 201 (H) 70 - 99 mg/dL    Comment: Glucose reference range applies only to samples taken after fasting for at least 8 hours.   Comment 1 Notify RN    Comment 2 Document in Chart  Glucose, capillary     Status: Abnormal   Collection Time: 01/23/21 11:22 PM  Result Value Ref Range   Glucose-Capillary 201 (H) 70 - 99 mg/dL    Comment: Glucose reference range applies only to samples taken after fasting for at least 8 hours.   Comment 1 Notify RN    Comment 2 Document in Chart   Glucose, capillary     Status: Abnormal   Collection Time: 01/24/21  4:41 AM  Result Value Ref Range   Glucose-Capillary 182 (H) 70 - 99 mg/dL     Comment: Glucose reference range applies only to samples taken after fasting for at least 8 hours.   Comment 1 Notify RN    Comment 2 Document in Chart   CBC     Status: Abnormal   Collection Time: 01/24/21  4:50 AM  Result Value Ref Range   WBC 16.9 (H) 4.0 - 10.5 K/uL   RBC 3.71 (L) 3.87 - 5.11 MIL/uL   Hemoglobin 10.2 (L) 12.0 - 15.0 g/dL   HCT 32.3 (L) 36.0 - 46.0 %   MCV 87.1 80.0 - 100.0 fL   MCH 27.5 26.0 - 34.0 pg   MCHC 31.6 30.0 - 36.0 g/dL   RDW 13.9 11.5 - 15.5 %   Platelets 488 (H) 150 - 400 K/uL   nRBC 0.0 0.0 - 0.2 %    Comment: Performed at [facility] [address], [place], [place] [address]  Basic metabolic panel     Status: Abnormal   Collection Time: 01/24/21  4:50 AM  Result Value Ref Range   Sodium 138 135 - 145 mmol/L    Comment: DELTA CHECK NOTED   Potassium 4.4 3.5 - 5.1 mmol/L   Chloride 102 98 - 111 mmol/L   CO2 29 22 - 32 mmol/L   Glucose, Bld 200 (H) 70 - 99 mg/dL    Comment: Glucose reference range applies only to samples taken after fasting for at least 8 hours.   BUN 25 (H) 6 - 20 mg/dL   Creatinine, Ser 0.74 0.44 - 1.00 mg/dL   Calcium 8.8 (L) 8.9 - 10.3 mg/dL   GFR, Estimated >60 >60 mL/min    Comment: (NOTE) Calculated using the CKD-EPI Creatinine Equation (2021)    Anion gap 7 5 - 15    Comment: Performed at [facility] [address] [place], [place] [address]  Glucose, capillary     Status: Abnormal   Collection Time: 01/24/21  8:14 AM  Result Value Ref Range   Glucose-Capillary 167 (H) 70 - 99 mg/dL    Comment: Glucose reference range applies only to samples taken after fasting for at least 8 hours.   DG Chest Port 1 View  Result Date: 01/24/2021 CLINICAL DATA:  Pleural effusion. EXAM: PORTABLE CHEST 1 VIEW COMPARISON:  January 23, 2021. FINDINGS: Stable cardiomegaly. Mild bibasilar atelectasis is again noted with associated pleural effusions. Right-sided PICC line is unchanged in position. Bony thorax is  unremarkable. Status post coronary bypass graft. IMPRESSION: Stable bibasilar atelectasis is noted with associated pleural effusions. Electronically Signed   By: Marijo Conception M.D.   On: 01/24/2021 08:49   DG Chest Port 1 View  Result Date: 01/23/2021 CLINICAL DATA:  Sore chest, post CABG EXAM: PORTABLE CHEST 1 VIEW COMPARISON:  01/22/2021 FINDINGS: Right PICC line remains present. Enteric tube is again partially imaged entering the stomach. Bilateral chest tubes no longer present. Similar cardiomegaly. Bibasilar atelectasis. Pulmonary vascular congestion.  Probable small pleural effusions. No pneumothorax. IMPRESSION: Chest tube is no longer present.  No pneumothorax. Cardiomegaly with pulmonary vascular congestion. Similar bibasilar atelectasis and probable small pleural effusions. Electronically Signed   By: Macy Mis M.D.   On: 01/23/2021 09:00   DG Swallowing Func-Speech Pathology  Result Date: 01/22/2021 Table formatting from the original result was not included. Objective Swallowing Evaluation: Type of Study: MBS-Modified Barium Swallow Study  Patient Details Name: Angellynn Kimberlin MRN: [id number] Date of Birth: 05-13-1960 Today's Date: 01/22/2021 Time: SLP Start Time (ACUTE ONLY): 9562 -SLP Stop Time (ACUTE ONLY): 1330 SLP Time Calculation (min) (ACUTE ONLY): 17 min Past Medical History: Past Medical History: Diagnosis Date  Anxiety   Bilateral carotid artery stenosis 2014  Chronic kidney disease 2017  Current smoker   CVA (cerebral vascular accident) ([place]) 2013  Depression   Diverticulosis   Fatty liver   Fibromyalgia   GERD (gastroesophageal reflux disease)   Hiatal hernia   Hypercholesteremia   Hypertension   IBS (irritable bowel syndrome)   PAD (peripheral artery disease) ([place])   Peptic ulcer   T2DM (type 2 diabetes mellitus) ([place])  Past Surgical History: Past Surgical History: Procedure Laterality Date  CORONARY ARTERY BYPASS GRAFT N/A 01/18/2021  Procedure: CORONARY ARTERY BYPASS GRAFTING (CABG)  x 4  USING LEFT INTERNAL MAMMARY ARTERY AND LEFT ENDOSCOPIC GREATER SAPHENOUS VEIN CONDUITS;  Surgeon: [name], MD;  Location: [place];  Service: Open Heart Surgery;  Laterality: N/A;  CORONARY/GRAFT ACUTE MI REVASCULARIZATION N/A 01/13/2021  Procedure: Coronary/Graft Acute MI Revascularization;  Surgeon: Isaias Cowman, MD;  Location: [place] [facility];  Service: Cardiovascular;  Laterality: N/A;  ENDOVEIN HARVEST OF GREATER SAPHENOUS VEIN Left 01/18/2021  Procedure: ENDOVEIN HARVEST OF GREATER SAPHENOUS VEIN;  Surgeon: [name], MD;  Location: [place];  Service: Open Heart Surgery;  Laterality: Left;  IABP INSERTION Right 01/13/2021  Procedure: IABP Insertion;  Surgeon: Isaias Cowman, MD;  Location: [place] [facility];  Service: Cardiovascular;  Laterality: Right;  LEFT HEART CATH AND CORONARY ANGIOGRAPHY N/A 01/13/2021  Procedure: LEFT HEART CATH AND CORONARY ANGIOGRAPHY;  Surgeon: Isaias Cowman, MD;  Location: [place] [facility];  Service: Cardiovascular;  Laterality: N/A;  TEE WITHOUT CARDIOVERSION N/A 01/18/2021  Procedure: TRANSESOPHAGEAL ECHOCARDIOGRAM (TEE);  Surgeon: [name], MD;  Location: [place];  Service: Open Heart Surgery;  Laterality: N/A; HPI: Pt is a 60 yo female who [facility] on [facility]12[facility]21 via EMS due to acute respiratory failure and unresponsiveness. ETT 11/26-11/27; reintubated 11/27 with CODE STEMI and extubated 11/30; developed stridor and vomiting with concern for aspiration so reintubated 11/30 until self-extubated 12/4. Pt s/p CABG x4 on 12/2. PMH includes: PAD, HTN, HLD, current tobacco abuse, COPD, DM2, CVA, GERD, hiatal hernia, anxiety  Subjective: alert, denies h/o dysphagia  Recommendations for follow up therapy are one component of a multi-disciplinary discharge planning process, led by the attending physician.  Recommendations may be updated based on patient status, additional functional criteria and  insurance authorization. Assessment / Plan / Recommendation Clinical Impressions 01/22/2021 Clinical Impression Pt presents with a moderate pharyngeal more than oral dysphagia, although she does have mildly reduced lingual propulsion with small amounts of lingual residue with solids. Pharyngeally she has reduced hyolaryngeal excursion, base of tongue retraction, and pharyngeal squeeze. Subsequently this results in incomplete epiglottic inversion and laryngeal vestibule closure, with possible post-extubation choices also impacting glottic closure given dysphonia. Residue increases as trials become thicker, leaving more diffuse residue with thicker liquids  and solids. She performs a second swallow spontaneously that reduces but doesn't always eliminate this residue. Boluses enter into her airway before the swallow when she is not able to achieve adequate closure, aspirating thin liquids most consistently compared to other consistencies, although penetration is deep with thicker liquids and resulted in aspiration with honey thick liquids via cup x1 as well. A chin tuck does not increase airway protection or reduce residue. Pt has the most airway protection with purees and honey thick liquids by spoon, although risk for aspiration is still present given the amount of residue in her pharynx combined also with her mentation. Considering her recent cardiac surgery and repeated intubations, recommend initiating snacks only of purees and honey thick liquids by spoon to allow her to start more gradually trying POs to increase use of her swallowing musculature. SLP will also f/u for potential pharyngeal exercises. SLP Visit Diagnosis Dysphagia, oropharyngeal phase (R13.12) Attention and concentration deficit following -- Frontal lobe and executive function deficit following -- Impact on safety and function Moderate aspiration risk   Treatment Recommendations 01/22/2021 Treatment Recommendations Therapy as outlined in treatment  plan below   Prognosis 01/22/2021 Prognosis for Safe Diet Advancement Good Barriers to Reach Goals Cognitive deficits Barriers/Prognosis Comment -- Diet Recommendations 01/22/2021 SLP Diet Recommendations NPO;Alternative means - temporary;Other (Comment) Liquid Administration via Spoon Medication Administration Via alternative means Compensations Slow rate;Small sips/bites;Effortful swallow;Multiple dry swallows after each bite/sip Postural Changes Seated upright at 90 degrees   Other Recommendations 01/22/2021 Recommended Consults -- Oral Care Recommendations Oral care QID Other Recommendations Have oral suction available Follow Up Recommendations Acute inpatient rehab (3hours/day) Assistance recommended at discharge Intermittent Supervision/Assistance Functional Status Assessment Patient has had a recent decline in their functional status and demonstrates the ability to make significant improvements in function in a reasonable and predictable amount of time. Frequency and Duration  01/22/2021 Speech Therapy Frequency (ACUTE ONLY) min 2x/week Treatment Duration 2 weeks   Oral Phase 01/22/2021 Oral Phase Impaired Oral - Pudding Teaspoon -- Oral - Pudding Cup -- Oral - Honey Teaspoon WFL Oral - Honey Cup WFL Oral - Nectar Teaspoon WFL Oral - Nectar Cup WFL Oral - Nectar Straw -- Oral - Thin Teaspoon -- Oral - Thin Cup WFL Oral - Thin Straw -- Oral - Puree Weak lingual manipulation;Lingual/palatal residue Oral - Mech Soft -- Oral - Regular Lingual/palatal residue Oral - Multi-Consistency -- Oral - Pill -- Oral Phase - Comment --  Pharyngeal Phase 01/22/2021 Pharyngeal Phase Impaired Pharyngeal- Pudding Teaspoon -- Pharyngeal -- Pharyngeal- Pudding Cup -- Pharyngeal -- Pharyngeal- Honey Teaspoon Reduced pharyngeal peristalsis;Reduced epiglottic inversion;Reduced anterior laryngeal mobility;Reduced airway/laryngeal closure;Reduced tongue base retraction;Pharyngeal residue - pyriform;Penetration/Aspiration before  swallow;Penetration/Apiration after swallow;Pharyngeal residue - valleculae Pharyngeal Material enters airway, remains ABOVE vocal cords and not ejected out Pharyngeal- Honey Cup Reduced pharyngeal peristalsis;Reduced epiglottic inversion;Reduced anterior laryngeal mobility;Reduced airway/laryngeal closure;Reduced tongue base retraction;Pharyngeal residue - pyriform;Penetration/Aspiration before swallow;Penetration/Apiration after swallow;Pharyngeal residue - valleculae Pharyngeal Material enters airway, passes BELOW cords without attempt by patient to eject out (silent aspiration) Pharyngeal- Nectar Teaspoon Reduced pharyngeal peristalsis;Reduced epiglottic inversion;Reduced anterior laryngeal mobility;Reduced airway/laryngeal closure;Reduced tongue base retraction;Pharyngeal residue - pyriform;Penetration/Aspiration before swallow;Penetration/Apiration after swallow;Pharyngeal residue - valleculae Pharyngeal Material enters airway, CONTACTS cords and not ejected out Pharyngeal- Nectar Cup Reduced pharyngeal peristalsis;Reduced epiglottic inversion;Reduced anterior laryngeal mobility;Reduced airway/laryngeal closure;Reduced tongue base retraction;Pharyngeal residue - pyriform;Penetration/Aspiration before swallow;Penetration/Apiration after swallow;Pharyngeal residue - valleculae Pharyngeal Material enters airway, CONTACTS cords and not ejected out Pharyngeal- Nectar Straw -- Pharyngeal -- Pharyngeal- Thin Teaspoon --  Pharyngeal -- Pharyngeal- Thin Cup Reduced pharyngeal peristalsis;Reduced epiglottic inversion;Reduced anterior laryngeal mobility;Reduced airway/laryngeal closure;Reduced tongue base retraction;Pharyngeal residue - pyriform;Penetration/Aspiration before swallow;Penetration/Apiration after swallow Pharyngeal Material enters airway, passes BELOW cords without attempt by patient to eject out (silent aspiration) Pharyngeal- Thin Straw -- Pharyngeal -- Pharyngeal- Puree Reduced pharyngeal  peristalsis;Reduced epiglottic inversion;Reduced anterior laryngeal mobility;Reduced airway/laryngeal closure;Reduced tongue base retraction;Pharyngeal residue - pyriform;Pharyngeal residue - valleculae Pharyngeal -- Pharyngeal- Mechanical Soft -- Pharyngeal -- Pharyngeal- Regular Reduced pharyngeal peristalsis;Reduced epiglottic inversion;Reduced anterior laryngeal mobility;Reduced airway/laryngeal closure;Reduced tongue base retraction;Pharyngeal residue - pyriform;Pharyngeal residue - valleculae Pharyngeal -- Pharyngeal- Multi-consistency -- Pharyngeal -- Pharyngeal- Pill -- Pharyngeal -- Pharyngeal Comment --  Cervical Esophageal Phase  01/22/2021 Cervical Esophageal Phase WFL Pudding Teaspoon -- Pudding Cup -- Honey Teaspoon -- Honey Cup -- Nectar Teaspoon -- Nectar Cup -- Nectar Straw -- Thin Teaspoon -- Thin Cup -- Thin Straw -- Puree -- Mechanical Soft -- Regular -- Multi-consistency -- Pill -- Cervical Esophageal Comment -- Osie Bond., M.A. CCC-SLP Acute Rehabilitation Services Pager [phone] Office [phone] 01/22/2021, 4:49 PM                      BP (!) 155/66 (BP Location: Left Arm)   Pulse 70   Temp 98.3 F (36.8 C) (Oral)   Resp 17   Ht _0  (1.676 m)   Wt 86.7 kg   LMP  (LMP Unknown) Comment: pt intubated  SpO2 98%   BMI 30.85 kg/m   Medical Problem List and Plan: 1.  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  Antithrombotics: -DVT/anticoagulation:  Pharmaceutical: Lovenox 40 mg QD  -antiplatelet therapy: Plavix 75 mg daily and aspirin 81 mg QD 3. Pain Management: oxycodone IR 5/10 mg every 3 hours PRN, Neurontin 600 mg TID 4. Mood: continue Prozac, LCSW to provide emotional support  -antipsychotic agents: n/a  -nicotine patch ordered for nicotine craving 32m 5. Neuropsych: This patient is capable of making decisions on her own behalf. 6. Skin/Wound  Care: sternal wound cdi, can get wet.  7. Fluids/Electrolytes/Nutrition: routine I/Os and follow-up chemistries 8. Hypertension: BP stable, rate controlled, continue Pacerone, Norvasc, Lopressor  -bp controlled at present 9.Hyperlipidemia: continue Lipitor 10. Diabetes mellitus type 2: continue insuline regimen and CBG checks  -cbg's well controlled currently 11. COPD: continue Anoro Ellipta (prescribe at discharge) 12. Tobacco use: provide nicotine patch, smoking cessation counseling 13. PUD/GERD: continue PPI 14.  Acute blood loss anemia/leukocytosis.  Follow-up chemistries  -no signs of blood loss on exam 15.  Dysphagia.  Silent aspiration during recent MBS, needed cues to observe precautions with SLP, wet voice quality observed with honey thru cup. -Pt is NPO with snacks of purees/honey liquids by teaspoon only  -TF per cortrak 16. Foley  -has been for diuresis  -recent hematuria d/t foley trauma  -remove foley in AM tomorrow and begin voiding trial.   SBarbie Banner PA-C  01/24/2021

## 2021-01-24 NOTE — Care Management Important Message (Signed)
Important Message  Patient Details  Name: Morgan Daniels MRN: [id number] Date of Birth: September 25, 1960   Medicare Important Message Given:  Yes     Aaronmichael Brumbaugh 01/24/2021, 1:49 PM

## 2021-01-24 NOTE — Progress Notes (Addendum)
[address]       [place] [address]             [phone]      6 Days Post-Op Procedure(s) (LRB): CORONARY ARTERY BYPASS GRAFTING (CABG) x 4  USING LEFT INTERNAL MAMMARY ARTERY AND LEFT ENDOSCOPIC GREATER SAPHENOUS VEIN CONDUITS (N/A) TRANSESOPHAGEAL ECHOCARDIOGRAM (TEE) (N/A) APPLICATION OF CELL SAVER ENDOVEIN HARVEST OF GREATER SAPHENOUS VEIN (Left)  Subjective:  Patient okay this morning.  Events of last night noted.  She denies injury, but states she is stubborn.  Instructed patient to not get up without help to ensure no further falls occur.    Objective: Vital signs in last 24 hours: Temp:  [98.1 F (36.7 C)-98.8 F (37.1 C)] 98.4 F (36.9 C) (12/08 0444) Pulse Rate:  [67-92] 88 (12/08 0500) Cardiac Rhythm: Normal sinus rhythm (12/08 0330) Resp:  [14-24] 19 (12/08 0500) BP: (114-155)/(58-75) 128/69 (12/08 0500) SpO2:  [92 %-99 %] 92 % (12/08 0500) Weight:  [86 kg] 86 kg (12/08 0500)  Intake/Output from previous day: 12/07 0701 - 12/08 0700 In: 2198.2 [P.O.:600; I.V.:10; NG/GT:1588.2] Out: 2050 [Urine:2050]  General appearance: alert, cooperative, and no distress Heart: regular rate and rhythm Lungs: clear to auscultation bilaterally Abdomen: soft, non-tender; bowel sounds normal; no masses,  no organomegaly Extremities: extremities normal, atraumatic, no cyanosis or edema Wound: clean and dry sternotomy, EVH site, CT sites with blackish eschar present  Lab Results: Recent Labs    01/23/21 0457 01/24/21 0450  WBC 14.1* 16.9*  HGB 9.5* 10.2*  HCT 31.6* 32.3*  PLT 429* 488*   BMET:  Recent Labs    01/23/21 0457 01/24/21 0450  NA 146* 138  K 3.8 4.4  CL 107 102  CO2 32 29  GLUCOSE 135* 200*  BUN 35* 25*  CREATININE 0.65 0.74  CALCIUM 8.8* 8.8*    PT/INR: No results for input(s): LABPROT, INR in the last 72 hours. ABG    Component Value Date/Time   PHART 7.463 (H) 01/20/2021 0420   HCO3 26.9 01/20/2021 0420   TCO2 28  01/20/2021 0420   ACIDBASEDEF 9.6 (H) 01/12/2021 2122   O2SAT 61.0 01/23/2021 0457   CBG (last 3)  Recent Labs    01/23/21 2033 01/23/21 2322 01/24/21 0441  GLUCAP 201* 201* 182*    Assessment/Plan: S/P Procedure(s) (LRB): CORONARY ARTERY BYPASS GRAFTING (CABG) x 4  USING LEFT INTERNAL MAMMARY ARTERY AND LEFT ENDOSCOPIC GREATER SAPHENOUS VEIN CONDUITS (N/A) TRANSESOPHAGEAL ECHOCARDIOGRAM (TEE) (N/A) APPLICATION OF CELL SAVER ENDOVEIN HARVEST OF GREATER SAPHENOUS VEIN (Left)  CV- NSR, BP stable- continue Amiodarone, Lopressor, Norvasc Pulm- bilateral atelectasis, effusions.. not requiring oxygen, continue IS Renal- creatinine WNL, K is at 4.4, weight is trending down, continue Lasix. Potassium Dysphagia- patients Cortrak fell out last evening when she suffered a fall, will hold off on replacing until SLP evaluation completed today, if she doesn't advance diet will need to have Cortrak replaced DM- sugars elevated at times, with tube feedings not currently running, will leave insulin alone, will need to adjust if tube feedings get resumed or as oral diet is advanced Deconditioning- PT recs CIR Dispo- patient stable, suffered a fall last night, fortunately she was not injured.  Her Cortrak fell out at that time, will await SLP evaluation for today to see if diet is able to get advanced.. if she remains NPO she will need Cortrak replaced, she is diuresing continue Lasix, potassium maintaining NSR... she will be ready for CIR once  a diet can be initiated   LOS: 11 days   Ellwood Handler, PA-C 01/24/2021  Patient seen and examined, agree with above  [name], MD [facility] [phone]

## 2021-01-24 NOTE — Progress Notes (Addendum)
Inpatient Rehabilitation Admissions Coordinator   Noted fell in past 24 hrs with Cortrak removed. CIR can accept patient with Cortrak and patient NPO as long as she has a feeding source and med route. I await medical clearance to admit to CIR.  Danne Baxter, RN, MSN Rehab Admissions Coordinator [phone] 01/24/2021 8:30 AM  I have left message for Junie Panning PA to call me once out of surgery to discuss. Discussed with RN and we will have SLP reassess her today. I have a CIR bed that I can admit her to today pending a means of nutrition. We could place cortrak at [facility] on Friday with Cortrak team if recommended.  Danne Baxter, RN, MSN Rehab Admissions Coordinator [phone] 01/24/2021 10:04 AM  Discussed with Junie Panning PA by phone. I will follow up Friday for possible admit pending clearance by Acute MD.  Danne Baxter, RN, MSN Rehab Admissions Coordinator [phone] 01/24/2021 12:00 PM

## 2021-01-24 NOTE — Progress Notes (Signed)
Physical Therapy Treatment Patient Details Name: Morgan Daniels MRN: [id number] DOB: 12-Jan-1961 Today's Date: 01/24/2021   History of Present Illness Pt adm to [facility] 01/12/21 with repiratory failure and unresponsiveness. Pt intubated on admission 11/26. Extubated 11/27 developed respiratory distress and reintubated. Code STEMI called and pt cath showed severe 3 vessel disease and IABP placed. Pt transferred to [facility]. Pt extubated 11/30 and later reintubated on 11/30.  Pt underwent CABG x 4 on 12/2.  IABP removed 12/3. Pt self extubated 12/4. Had a fall on 12/8. PMH - PAD, CVA, HTN, CKD, DM, IBS    PT Comments    Pt progressing with mobility. Today's session focused on functional activity and ambulation. Able to progress ambulation distance today, but pt had multiple LOB requiring assist to correct. Needed +2 physical assist for safety and max cues for mobility. Pt continues to be limited by impaired cognition, balance deficits, decreased functional mobility, and decreased activity tolerance. Pt motivated to improve and remains an excellent candidate for intensive therapy to maximize functional mobility and independence. Will continue to follow acutely to address established goals.   Recommendations for follow up therapy are one component of a multi-disciplinary discharge planning process, led by the attending physician.  Recommendations may be updated based on patient status, additional functional criteria and insurance authorization.  Follow Up Recommendations  Acute inpatient rehab (3hours/day)     Assistance Recommended at Discharge Frequent or constant Supervision/Assistance  Equipment Recommendations  Rolling walker (2 wheels) (TBD)    Recommendations for Other Services       Precautions / Restrictions Precautions Precautions: Fall;Sternal;Other (comment) Precaution Comments: monitor O2 (does not wear at baseline)     Mobility  Bed Mobility Overal bed mobility: Needs Assistance Bed  Mobility: Supine to Sit     Supine to sit: Supervision     General bed mobility comments: cues to maintain sternal percautions    Transfers Overall transfer level: Needs assistance Equipment used: Rolling walker (2 wheels) Transfers: Sit to/from Stand Sit to Stand: Min guard           General transfer comment: cues for hand placement; increased time to rise; min guard for safety    Ambulation/Gait Ambulation/Gait assistance: Mod assist;+2 safety/equipment;Min assist;Min guard Gait Distance (Feet): 180 Feet Assistive device: Rolling walker (2 wheels) Gait Pattern/deviations: Step-through pattern;Decreased stride length;Trunk flexed;Narrow base of support Gait velocity: decreased     General Gait Details: Frequently losing balance with head turns; requiring up to mod assist to correct. Pt easily distracted, requring max cues for RW management, sequencing, and attention   Stairs             Wheelchair Mobility    Modified Rankin (Stroke Patients Only)       Balance Overall balance assessment: Needs assistance Sitting-balance support: No upper extremity supported;Feet supported Sitting balance-Leahy Scale: Fair Sitting balance - Comments: able to sit EOB without assist   Standing balance support: Bilateral upper extremity supported;During functional activity;Reliant on assistive device for balance   Standing balance comment: reliant on UE and external support; able to remove one hand from RW for posterior pericare                            Cognition Arousal/Alertness: Awake/alert Behavior During Therapy: Impulsive Overall Cognitive Status: Impaired/Different from baseline Area of Impairment: Memory;Following commands;Safety/judgement;Awareness;Problem solving  Memory: Decreased recall of precautions;Decreased short-term memory Following Commands: Follows one step commands inconsistently;Follows one step commands  with increased time Safety/Judgement: Decreased awareness of deficits;Decreased awareness of safety Awareness: Emergent Problem Solving: Slow processing;Difficulty sequencing;Requires verbal cues General Comments: multiple cues needed to recall and implement sternal percautions; max cues need during mobility for RW management and direction        Exercises      General Comments General comments (skin integrity, edema, etc.): SpO2 down to 85% on RA seated, back up to 90% on 5L. Nasal canula frequently falling off. Ambulated on 4L. Spouse present during session and supportive.      Pertinent Vitals/Pain Pain Assessment: Faces Faces Pain Scale: No hurt Pain Location: surgery site with movements, feet Pain Descriptors / Indicators: Grimacing;Guarding Pain Intervention(s): Limited activity within patient's tolerance;Monitored during session    Home Living                          Prior Function            PT Goals (current goals can now be found in the care plan section) Acute Rehab PT Goals Patient Stated Goal: to get home with her dog PT Goal Formulation: With patient Time For Goal Achievement: 02/05/21 Potential to Achieve Goals: Good Progress towards PT goals: Progressing toward goals    Frequency    Min 3X/week      PT Plan Current plan remains appropriate    Co-evaluation              AM-PAC PT "6 Clicks" Mobility   Outcome Measure  Help needed turning from your back to your side while in a flat bed without using bedrails?: A Little Help needed moving from lying on your back to sitting on the side of a flat bed without using bedrails?: A Little Help needed moving to and from a bed to a chair (including a wheelchair)?: A Little Help needed standing up from a chair using your arms (e.g., wheelchair or bedside chair)?: A Little Help needed to walk in hospital room?: Total Help needed climbing 3-5 steps with a railing? : Total 6 Click Score:  14    End of Session Equipment Utilized During Treatment: Gait belt Activity Tolerance: Patient tolerated treatment well Patient left: in chair;with call bell/phone within reach;with chair alarm set;with family/visitor present Nurse Communication: Mobility status (need for new catheter) PT Visit Diagnosis: Unsteadiness on feet (R26.81);Other abnormalities of gait and mobility (R26.89);Muscle weakness (generalized) (M62.81)     Time: 5208-0223 PT Time Calculation (min) (ACUTE ONLY): 41 min  Charges:  $Gait Training: 8-22 mins $Therapeutic Activity: 8-22 mins                    Brandon Melnick, SPT    Brandon Melnick 01/24/2021, 5:05 PM

## 2021-01-24 NOTE — Progress Notes (Addendum)
Speech Language Pathology Treatment: Dysphagia  Patient Details Name: Morgan Daniels MRN: [id number] DOB: 06-Oct-1960 Today's Date: 01/24/2021 Time: 7829-5621 SLP Time Calculation (min) (ACUTE ONLY): 15 min  Assessment / Plan / Recommendation Clinical Impression  Pt was seen for dysphagia treatment. Pt's Cortrak reported fell out since pt was last seen by SLP. Pt reported completion of dysphagia exercises, and observance of swallowing precautions with snacks. However, pt required cues and prompts to observe precautions during the session. Pt tolerated small amounts of puree and honey thick liquids via tsp without overt s/sx of aspiration. However, she exhibited a mildly wet vocal quality with honey thick liquids via cup and delayed throat clearing following consumption of a full container of applesauce. Pt demonstrated silent aspiration during the MBS two days prior and this SLP believes that it would be imprudent to initiate a p.o. diet this soon after the study without being able to rule out silent aspiration. It is therefore recommended that the pt's NPO status be maintained, but with continued allowance of snacks of puree and honey thick liquids via tsp. Critical meds may be given crushed in small amounts of puree. SLP anticipates that Cortrak would need to be replaced, but will continue to follow pt.      HPI HPI: Pt is a 60 yo female who presented to the [facility] ED on 01/12/2021 via EMS due to acute respiratory failure and unresponsiveness. ETT 11/26-11/27; reintubated 11/27 with CODE STEMI and extubated 11/30; developed stridor and vomiting with concern for aspiration so reintubated 11/30 until self-extubated 12/4. Pt s/p CABG x4 on 12/2. PMH includes: PAD, HTN, HLD, current tobacco abuse, COPD, DM2, CVA, GERD, hiatal hernia, anxiety      SLP Plan  Continue with current plan of care      Recommendations for follow up therapy are one component of a multi-disciplinary discharge planning process, led  by the attending physician.  Recommendations may be updated based on patient status, additional functional criteria and insurance authorization.    Recommendations  Diet recommendations: NPO;Other(comment) (pt can have small snacks of purees and honey thick liquids from floor stock) Liquids provided via: Teaspoon Medication Administration: Critical meds may be crushed and given in small amounts of puree.  Compensations: Slow rate;Small sips/bites;Effortful swallow;Multiple dry swallows after each bite/sip Postural Changes and/or Swallow Maneuvers: Seated upright 90 degrees                Oral Care Recommendations: Oral care QID Follow Up Recommendations: Acute inpatient rehab (3hours/day) Assistance recommended at discharge: Intermittent Supervision/Assistance SLP Visit Diagnosis: Dysphagia, oropharyngeal phase (R13.12) Plan: Continue with current plan of care       Morgan Daniels, [place], [place] Office number [phone] Pager [place]  01/24/2021, 5:06 PM

## 2021-01-25 ENCOUNTER — Other Ambulatory Visit: Payer: Self-pay

## 2021-01-25 ENCOUNTER — Encounter (HOSPITAL_COMMUNITY): Payer: Self-pay | Admitting: Physical Medicine and Rehabilitation

## 2021-01-25 ENCOUNTER — Encounter: Payer: Self-pay | Admitting: Nurse Practitioner

## 2021-01-25 ENCOUNTER — Inpatient Hospital Stay (HOSPITAL_COMMUNITY)
Admission: RE | Admit: 2021-01-25 | Discharge: 2021-02-01 | DRG: 945 | Disposition: A | Discharge status: Home Health | Payer: Medicare Other | Source: Intra-hospital | Attending: Physical Medicine and Rehabilitation | Admitting: Physical Medicine and Rehabilitation

## 2021-01-25 ENCOUNTER — Inpatient Hospital Stay (HOSPITAL_COMMUNITY): Payer: Medicare Other

## 2021-01-25 DIAGNOSIS — K760 Fatty (change of) liver, not elsewhere classified: Secondary | ICD-10-CM | POA: Diagnosis present

## 2021-01-25 DIAGNOSIS — Z713 Dietary counseling and surveillance: Secondary | ICD-10-CM

## 2021-01-25 DIAGNOSIS — N390 Urinary tract infection, site not specified: Secondary | ICD-10-CM | POA: Diagnosis not present

## 2021-01-25 DIAGNOSIS — Z6830 Body mass index (BMI) 30.0-30.9, adult: Secondary | ICD-10-CM

## 2021-01-25 DIAGNOSIS — E669 Obesity, unspecified: Secondary | ICD-10-CM | POA: Diagnosis present

## 2021-01-25 DIAGNOSIS — J9601 Acute respiratory failure with hypoxia: Secondary | ICD-10-CM | POA: Diagnosis present

## 2021-01-25 DIAGNOSIS — F17210 Nicotine dependence, cigarettes, uncomplicated: Secondary | ICD-10-CM | POA: Diagnosis present

## 2021-01-25 DIAGNOSIS — Z951 Presence of aortocoronary bypass graft: Secondary | ICD-10-CM

## 2021-01-25 DIAGNOSIS — Z79899 Other long term (current) drug therapy: Secondary | ICD-10-CM

## 2021-01-25 DIAGNOSIS — I214 Non-ST elevation (NSTEMI) myocardial infarction: Secondary | ICD-10-CM | POA: Diagnosis present

## 2021-01-25 DIAGNOSIS — Z7902 Long term (current) use of antithrombotics/antiplatelets: Secondary | ICD-10-CM | POA: Diagnosis not present

## 2021-01-25 DIAGNOSIS — R1313 Dysphagia, pharyngeal phase: Secondary | ICD-10-CM | POA: Diagnosis present

## 2021-01-25 DIAGNOSIS — E1151 Type 2 diabetes mellitus with diabetic peripheral angiopathy without gangrene: Secondary | ICD-10-CM | POA: Diagnosis present

## 2021-01-25 DIAGNOSIS — E1169 Type 2 diabetes mellitus with other specified complication: Secondary | ICD-10-CM | POA: Diagnosis not present

## 2021-01-25 DIAGNOSIS — Z8673 Personal history of transient ischemic attack (TIA), and cerebral infarction without residual deficits: Secondary | ICD-10-CM | POA: Diagnosis not present

## 2021-01-25 DIAGNOSIS — Z7982 Long term (current) use of aspirin: Secondary | ICD-10-CM

## 2021-01-25 DIAGNOSIS — Z8711 Personal history of peptic ulcer disease: Secondary | ICD-10-CM

## 2021-01-25 DIAGNOSIS — I2510 Atherosclerotic heart disease of native coronary artery without angina pectoris: Secondary | ICD-10-CM | POA: Diagnosis present

## 2021-01-25 DIAGNOSIS — E7800 Pure hypercholesterolemia, unspecified: Secondary | ICD-10-CM | POA: Diagnosis present

## 2021-01-25 DIAGNOSIS — R1312 Dysphagia, oropharyngeal phase: Secondary | ICD-10-CM

## 2021-01-25 DIAGNOSIS — R319 Hematuria, unspecified: Secondary | ICD-10-CM | POA: Diagnosis present

## 2021-01-25 DIAGNOSIS — Z741 Need for assistance with personal care: Secondary | ICD-10-CM | POA: Diagnosis present

## 2021-01-25 DIAGNOSIS — I10 Essential (primary) hypertension: Secondary | ICD-10-CM | POA: Diagnosis present

## 2021-01-25 DIAGNOSIS — T8383XD Hemorrhage of genitourinary prosthetic devices, implants and grafts, subsequent encounter: Secondary | ICD-10-CM

## 2021-01-25 DIAGNOSIS — K219 Gastro-esophageal reflux disease without esophagitis: Secondary | ICD-10-CM | POA: Diagnosis present

## 2021-01-25 DIAGNOSIS — F419 Anxiety disorder, unspecified: Secondary | ICD-10-CM | POA: Diagnosis present

## 2021-01-25 DIAGNOSIS — Y658 Other specified misadventures during surgical and medical care: Secondary | ICD-10-CM | POA: Diagnosis present

## 2021-01-25 DIAGNOSIS — D62 Acute posthemorrhagic anemia: Secondary | ICD-10-CM | POA: Diagnosis present

## 2021-01-25 DIAGNOSIS — F32A Depression, unspecified: Secondary | ICD-10-CM | POA: Diagnosis present

## 2021-01-25 DIAGNOSIS — F321 Major depressive disorder, single episode, moderate: Secondary | ICD-10-CM

## 2021-01-25 DIAGNOSIS — M797 Fibromyalgia: Secondary | ICD-10-CM | POA: Diagnosis present

## 2021-01-25 DIAGNOSIS — J449 Chronic obstructive pulmonary disease, unspecified: Secondary | ICD-10-CM | POA: Diagnosis present

## 2021-01-25 DIAGNOSIS — R269 Unspecified abnormalities of gait and mobility: Secondary | ICD-10-CM | POA: Diagnosis present

## 2021-01-25 DIAGNOSIS — R5381 Other malaise: Secondary | ICD-10-CM | POA: Diagnosis present

## 2021-01-25 LAB — GLUCOSE, CAPILLARY
Glucose-Capillary: 126 mg/dL — ABNORMAL HIGH (ref 70–99)
Glucose-Capillary: 132 mg/dL — ABNORMAL HIGH (ref 70–99)
Glucose-Capillary: 144 mg/dL — ABNORMAL HIGH (ref 70–99)
Glucose-Capillary: 171 mg/dL — ABNORMAL HIGH (ref 70–99)

## 2021-01-25 LAB — CBC
HCT: 31.3 % — ABNORMAL LOW (ref 36.0–46.0)
Hemoglobin: 9.7 g/dL — ABNORMAL LOW (ref 12.0–15.0)
MCH: 27.2 pg (ref 26.0–34.0)
MCHC: 31 g/dL (ref 30.0–36.0)
MCV: 87.7 fL (ref 80.0–100.0)
Platelets: 480 10*3/uL — ABNORMAL HIGH (ref 150–400)
RBC: 3.57 MIL/uL — ABNORMAL LOW (ref 3.87–5.11)
RDW: 14 % (ref 11.5–15.5)
WBC: 15.6 10*3/uL — ABNORMAL HIGH (ref 4.0–10.5)
nRBC: 0 % (ref 0.0–0.2)

## 2021-01-25 LAB — CREATININE, SERUM
Creatinine, Ser: 0.64 mg/dL (ref 0.44–1.00)
GFR, Estimated: >60 mL/min (ref >60)

## 2021-01-25 MED ORDER — DOCUSATE SODIUM 50 MG/5ML PO LIQD
200.0000 mg | Freq: Every day | ORAL | Status: DC | PRN
  Administered 2021-01-29: 200 mg
  Filled 2021-01-25: qty 20

## 2021-01-25 MED ORDER — BACITRACIN ZINC 500 UNIT/GM EX OINT: TOPICAL_OINTMENT | Freq: Every day | CUTANEOUS | 0 refills | Status: DC

## 2021-01-25 MED ORDER — INSULIN GLARGINE-YFGN 100 UNIT/ML ~~LOC~~ SOLN
40.0000 [IU] | Freq: Every day | SUBCUTANEOUS | Status: DC
  Administered 2021-01-26 – 2021-02-01 (×7): 40 [IU] via SUBCUTANEOUS
  Filled 2021-01-25 (×8): qty 0.4

## 2021-01-25 MED ORDER — AMIODARONE HCL 200 MG PO TABS
400.0000 mg | ORAL_TABLET | Freq: Every day | ORAL | Status: DC
Start: 2021-01-25 — End: 2021-02-01

## 2021-01-25 MED ORDER — PROSOURCE TF PO LIQD: 45.0000 mL | Freq: Two times a day (BID) | ORAL | Status: DC

## 2021-01-25 MED ORDER — METOPROLOL TARTRATE 25 MG PO TABS
25.0000 mg | ORAL_TABLET | Freq: Two times a day (BID) | ORAL | Status: DC
  Administered 2021-01-25 – 2021-01-30 (×10): 25 mg
  Filled 2021-01-25 (×10): qty 1

## 2021-01-25 MED ORDER — SIMETHICONE 40 MG/0.6ML PO SUSP
40.0000 mg | Freq: Four times a day (QID) | ORAL | Status: DC | PRN
  Administered 2021-01-29: 40 mg
  Filled 2021-01-25: qty 0.6

## 2021-01-25 MED ORDER — CHLORHEXIDINE GLUCONATE 0.12 % MT SOLN
15.0000 mL | Freq: Two times a day (BID) | OROMUCOSAL | Status: DC
  Administered 2021-01-25 – 2021-01-30 (×9): 15 mL via OROMUCOSAL
  Filled 2021-01-25 (×9): qty 15

## 2021-01-25 MED ORDER — AMLODIPINE BESYLATE 10 MG PO TABS
10.0000 mg | ORAL_TABLET | Freq: Every day | ORAL | Status: DC
  Administered 2021-01-26 – 2021-01-30 (×5): 10 mg
  Filled 2021-01-25 (×5): qty 1

## 2021-01-25 MED ORDER — ATORVASTATIN CALCIUM 80 MG PO TABS: 80.0000 mg | ORAL_TABLET | Freq: Every day | ORAL | Status: DC

## 2021-01-25 MED ORDER — POTASSIUM CHLORIDE 20 MEQ/15ML (10%) PO SOLN: 20.0000 meq | Freq: Every day | ORAL | 0 refills | Status: DC

## 2021-01-25 MED ORDER — FLUOXETINE HCL 20 MG PO CAPS
80.0000 mg | ORAL_CAPSULE | Freq: Every day | ORAL | Status: DC
  Administered 2021-01-26 – 2021-01-30 (×5): 80 mg
  Filled 2021-01-25 (×5): qty 4

## 2021-01-25 MED ORDER — NICOTINE 14 MG/24HR TD PT24: 14.0000 mg | MEDICATED_PATCH | Freq: Every day | TRANSDERMAL | 0 refills | Status: DC

## 2021-01-25 MED ORDER — PANTOPRAZOLE SODIUM 40 MG PO PACK: 40.0000 mg | PACK | Freq: Every day | ORAL | Status: DC

## 2021-01-25 MED ORDER — NICOTINE 14 MG/24HR TD PT24
14.0000 mg | MEDICATED_PATCH | Freq: Every day | TRANSDERMAL | Status: DC
  Administered 2021-01-26 – 2021-02-01 (×7): 14 mg via TRANSDERMAL
  Filled 2021-01-25 (×7): qty 1

## 2021-01-25 MED ORDER — GABAPENTIN 600 MG PO TABS
600.0000 mg | ORAL_TABLET | Freq: Three times a day (TID) | ORAL | Status: DC
  Administered 2021-01-25 – 2021-01-30 (×15): 600 mg
  Filled 2021-01-25 (×17): qty 1

## 2021-01-25 MED ORDER — PANTOPRAZOLE 2 MG/ML SUSPENSION
40.0000 mg | Freq: Every day | ORAL | Status: DC
  Administered 2021-01-26 – 2021-01-30 (×5): 40 mg
  Filled 2021-01-25 (×5): qty 20

## 2021-01-25 MED ORDER — FOOD THICKENER (SIMPLYTHICK HONEY): 1.0000 | ORAL | Status: DC | PRN

## 2021-01-25 MED ORDER — PROSOURCE TF PO LIQD
45.0000 mL | Freq: Two times a day (BID) | ORAL | Status: DC
  Administered 2021-01-25 – 2021-01-31 (×12): 45 mL
  Filled 2021-01-25 (×12): qty 45

## 2021-01-25 MED ORDER — VITAL 1.5 CAL PO LIQD
1000.0000 mL | ORAL | Status: DC
  Administered 2021-01-25: 1000 mL
  Filled 2021-01-25: qty 1000

## 2021-01-25 MED ORDER — UMECLIDINIUM-VILANTEROL 62.5-25 MCG/ACT IN AEPB
1.0000 | INHALATION_SPRAY | Freq: Every day | RESPIRATORY_TRACT | Status: DC
  Administered 2021-01-26 – 2021-02-01 (×7): 1 via RESPIRATORY_TRACT
  Filled 2021-01-25: qty 14

## 2021-01-25 MED ORDER — TRAMADOL HCL 50 MG PO TABS: 50.0000 mg | ORAL_TABLET | Freq: Four times a day (QID) | ORAL | Status: DC | PRN

## 2021-01-25 MED ORDER — ASPIRIN 81 MG PO CHEW
81.0000 mg | CHEWABLE_TABLET | Freq: Every day | ORAL | Status: DC
  Administered 2021-01-26 – 2021-01-30 (×5): 81 mg
  Filled 2021-01-25 (×5): qty 1

## 2021-01-25 MED ORDER — FUROSEMIDE 40 MG PO TABS: 40.0000 mg | ORAL_TABLET | Freq: Every day | ORAL | 11 refills | Status: DC

## 2021-01-25 MED ORDER — INSULIN ASPART 100 UNIT/ML IJ SOLN
6.0000 [IU] | INTRAMUSCULAR | Status: DC
  Administered 2021-01-25 – 2021-01-30 (×28): 6 [IU] via SUBCUTANEOUS

## 2021-01-25 MED ORDER — AMLODIPINE BESYLATE 10 MG PO TABS: 10.0000 mg | ORAL_TABLET | Freq: Every day | ORAL | Status: DC

## 2021-01-25 MED ORDER — VITAL 1.5 CAL PO LIQD: 1000.0000 mL | ORAL | Status: DC

## 2021-01-25 MED ORDER — JEVITY 1.5 CAL/FIBER PO LIQD
1000.0000 mL | ORAL | Status: DC
  Administered 2021-01-25 – 2021-01-29 (×6): 1000 mL
  Filled 2021-01-25 (×11): qty 1000

## 2021-01-25 MED ORDER — OXYCODONE HCL 5 MG PO TABS
5.0000 mg | ORAL_TABLET | ORAL | Status: DC | PRN
  Administered 2021-01-25 – 2021-01-30 (×16): 10 mg
  Filled 2021-01-25 (×16): qty 2

## 2021-01-25 MED ORDER — UMECLIDINIUM-VILANTEROL 62.5-25 MCG/ACT IN AEPB: 1.0000 | INHALATION_SPRAY | Freq: Every day | RESPIRATORY_TRACT | Status: DC

## 2021-01-25 MED ORDER — ENOXAPARIN SODIUM 40 MG/0.4ML IJ SOSY: 40.0000 mg | PREFILLED_SYRINGE | Freq: Every day | INTRAMUSCULAR | Status: DC

## 2021-01-25 MED ORDER — NICOTINE 14 MG/24HR TD PT24
14.0000 mg | MEDICATED_PATCH | Freq: Every day | TRANSDERMAL | Status: DC
  Administered 2021-01-25: 14 mg via TRANSDERMAL
  Filled 2021-01-25: qty 1

## 2021-01-25 MED ORDER — ALBUTEROL SULFATE (2.5 MG/3ML) 0.083% IN NEBU: 2.5000 mg | INHALATION_SOLUTION | RESPIRATORY_TRACT | 12 refills | Status: DC | PRN

## 2021-01-25 MED ORDER — ATORVASTATIN CALCIUM 80 MG PO TABS
80.0000 mg | ORAL_TABLET | Freq: Every day | ORAL | Status: DC
  Administered 2021-01-26 – 2021-01-30 (×5): 80 mg
  Filled 2021-01-25 (×5): qty 1

## 2021-01-25 MED ORDER — ENOXAPARIN SODIUM 40 MG/0.4ML IJ SOSY
40.0000 mg | PREFILLED_SYRINGE | INTRAMUSCULAR | Status: DC
  Administered 2021-01-25 – 2021-01-31 (×7): 40 mg via SUBCUTANEOUS
  Filled 2021-01-25 (×6): qty 0.4

## 2021-01-25 MED ORDER — CLOPIDOGREL BISULFATE 75 MG PO TABS
75.0000 mg | ORAL_TABLET | Freq: Every day | ORAL | Status: DC
  Administered 2021-01-26 – 2021-01-30 (×5): 75 mg
  Filled 2021-01-25 (×5): qty 1

## 2021-01-25 MED ORDER — AMIODARONE HCL 200 MG PO TABS
400.0000 mg | ORAL_TABLET | Freq: Every day | ORAL | Status: DC
  Administered 2021-01-26 – 2021-01-30 (×5): 400 mg via NASOGASTRIC
  Filled 2021-01-25 (×6): qty 2

## 2021-01-25 MED ORDER — INSULIN ASPART 100 UNIT/ML IJ SOLN
0.0000 [IU] | Freq: Three times a day (TID) | INTRAMUSCULAR | Status: DC
Start: 2021-01-25 — End: 2021-02-01
  Administered 2021-01-25 – 2021-01-26 (×2): 2 [IU] via SUBCUTANEOUS
  Administered 2021-01-26: 4 [IU] via SUBCUTANEOUS
  Administered 2021-01-26: 8 [IU] via SUBCUTANEOUS
  Administered 2021-01-27 (×2): 4 [IU] via SUBCUTANEOUS
  Administered 2021-01-27 – 2021-01-28 (×2): 2 [IU] via SUBCUTANEOUS
  Administered 2021-01-28: 12 [IU] via SUBCUTANEOUS
  Administered 2021-01-29: 4 [IU] via SUBCUTANEOUS
  Administered 2021-01-29: 2 [IU] via SUBCUTANEOUS
  Administered 2021-01-30: 08:00:00 8 [IU] via SUBCUTANEOUS
  Administered 2021-01-30 – 2021-01-31 (×2): 2 [IU] via SUBCUTANEOUS
  Administered 2021-01-31: 4 [IU] via SUBCUTANEOUS
  Administered 2021-01-31: 2 [IU] via SUBCUTANEOUS
  Administered 2021-02-01: 4 [IU] via SUBCUTANEOUS

## 2021-01-25 MED ORDER — BACITRACIN ZINC 500 UNIT/GM EX OINT
TOPICAL_OINTMENT | Freq: Every day | CUTANEOUS | Status: DC
  Filled 2021-01-25: qty 28.4

## 2021-01-25 MED ORDER — ALBUTEROL SULFATE (2.5 MG/3ML) 0.083% IN NEBU: 2.5000 mg | INHALATION_SOLUTION | RESPIRATORY_TRACT | Status: DC | PRN

## 2021-01-25 NOTE — Discharge Instructions (Addendum)
Inpatient Rehab Discharge Instructions  Morgan Daniels Discharge date and time: No discharge date for patient encounter.   Activities/Precautions/ Functional Status: Activity: Sternal precautions Diet:  Wound Care: Routine skin checks Functional status:  ___ No restrictions     ___ Walk up steps independently ___ 24/7 supervision/assistance   ___ Walk up steps with assistance ___ Intermittent supervision/assistance  ___ Bathe/dress independently ___ Walk with walker     __X_ Bathe/dress with assistance ___ Walk Independently    ___ Shower independently ___ Walk with assistance    ___ Shower with assistance ___ No alcohol     ___ Return to work/school ________  COMMUNITY REFERRALS UPON DISCHARGE:    Home Health:   PT     OT     ST                   Agency: [facility]  Phone: [phone]   Medical Equipment/Items Ordered: Home 02 Set up, Rolling Walker, Bedside Commode                                                 Agency/Supplier: [employer]    Special Instructions:  No driving smoking or alcohol  My questions have been answered and I understand these instructions. I will adhere to these goals and the provided educational materials after my discharge from the hospital.  Patient/Caregiver Signature _______________________________ Date __________  Clinician Signature _______________________________________ Date __________  Please bring this form and your medication list with you to all your follow-up doctor's appointments.

## 2021-01-25 NOTE — Progress Notes (Signed)
Pt's RN reported to me Ms.Fiala was sensing nicotine craving and requested a nicotine patch.  Order placed for 14mg  nicotine patch topically daily.   Macarthur Critchley, PA-C

## 2021-01-25 NOTE — Progress Notes (Addendum)
      [address]       [place] [address]             [phone]      7 Days Post-Op Procedure(s) (LRB): CORONARY ARTERY BYPASS GRAFTING (CABG) x 4  USING LEFT INTERNAL MAMMARY ARTERY AND LEFT ENDOSCOPIC GREATER SAPHENOUS VEIN CONDUITS (N/A) TRANSESOPHAGEAL ECHOCARDIOGRAM (TEE) (N/A) APPLICATION OF CELL SAVER ENDOVEIN HARVEST OF GREATER SAPHENOUS VEIN (Left)  Subjective:  Up in chair eating ice chips.  Is disappointed the Cortrak needs replaced.  Some mild hematuria which as been present since foley removal  Objective: Vital signs in last 24 hours: Temp:  [97.6 F (36.4 C)-98.6 F (37 C)] 98.3 F (36.8 C) (12/09 0300) Pulse Rate:  [68-86] 85 (12/09 0600) Cardiac Rhythm: Normal sinus rhythm (12/09 0348) Resp:  [14-22] 20 (12/09 0600) BP: (110-149)/(59-76) 137/74 (12/09 0600) SpO2:  [92 %-98 %] 94 % (12/09 0600) Weight:  [86.7 kg] 86.7 kg (12/09 0500)  Intake/Output from previous day: 12/08 0701 - 12/09 0700 In: 0  Out: 550 [Urine:550]  General appearance: alert, cooperative, and no distress Heart: regular rate and rhythm Lungs: clear to auscultation bilaterally Abdomen: soft, non-tender; bowel sounds normal; no masses,  no organomegaly Extremities: edema trace non-pitting Wound: clean and dry  Lab Results: Recent Labs    01/23/21 0457 01/24/21 0450  WBC 14.1* 16.9*  HGB 9.5* 10.2*  HCT 31.6* 32.3*  PLT 429* 488*   BMET:  Recent Labs    01/23/21 0457 01/24/21 0450  NA 146* 138  K 3.8 4.4  CL 107 102  CO2 32 29  GLUCOSE 135* 200*  BUN 35* 25*  CREATININE 0.65 0.74  CALCIUM 8.8* 8.8*    PT/INR: No results for input(s): LABPROT, INR in the last 72 hours. ABG    Component Value Date/Time   PHART 7.463 (H) 01/20/2021 0420   HCO3 26.9 01/20/2021 0420   TCO2 28 01/20/2021 0420   ACIDBASEDEF 9.6 (H) 01/12/2021 2122   O2SAT 61.0 01/23/2021 0457   CBG (last 3)  Recent Labs    01/24/21 1554 01/24/21 2143 01/25/21 0631  GLUCAP  137* 103* 126*    Assessment/Plan: S/P Procedure(s) (LRB): CORONARY ARTERY BYPASS GRAFTING (CABG) x 4  USING LEFT INTERNAL MAMMARY ARTERY AND LEFT ENDOSCOPIC GREATER SAPHENOUS VEIN CONDUITS (N/A) TRANSESOPHAGEAL ECHOCARDIOGRAM (TEE) (N/A) APPLICATION OF CELL SAVER ENDOVEIN HARVEST OF GREATER SAPHENOUS VEIN (Left)  CV- hemodynamically stable in NSR- on Amiodarone, Lopressor, Norvasc, and Plavix Pulm- no acute issues, continue flutter valve IS Renal- creatinine has been stable, weight is at baseline with edema improving, continue Lasix, potassium GI- unfortunately, patient is not ready for diet advancement, SLP is following, will replace Cortrak today and restart tube feedings Hematuria- likely from trauma from foley, H/H is stable with no acute signs of significant blood loss Deconditioning- CIR has bed for patient, they are able to take patient with Cortrak in place DM- sugars better yesterday, will likely elevate with tube feedings, will resume home insulin regimen for CIR discharge Dispo- patient stable, have re-ordered cortrak placement, continue SLP therapies, diuresing well will continue over next 7 days, for discharge to CIR today if okay with Dr. Roxan Hockey   LOS: 12 days   Ellwood Handler, PA-C 01/25/2021  Patient seen and examined, agree with above  [name], MD [facility] [phone]

## 2021-01-25 NOTE — Progress Notes (Addendum)
Initial Nutrition Assessment  DOCUMENTATION CODES:   Not applicable  INTERVENTION:  Initiate Jevity 1.5 cal formula at 30 ml/hr via Cortrak NGT and increase by 10 ml every 8 hours to goal rate of 60 ml/hr x 20 hours (May hold TF for up to 4 hours for therapy).   45 ml Prosource TF BID per tube.    Tube feeding regimen provides 1880 kcal, 99 grams of protein, and 912 ml of H2O.   NUTRITION DIAGNOSIS:   Inadequate oral intake related to inability to eat as evidenced by NPO status.  GOAL:   Patient will meet greater than or equal to 90% of their needs  MONITOR:   Diet advancement, Labs, Weight trends, TF tolerance, Skin, I & O's  REASON FOR ASSESSMENT:   Consult Enteral/tube feeding initiation and management  ASSESSMENT:   60 year old female with history of COPD, diabetes mellitus, hypertension, hyperlipidemia, prior stroke, gastroesophageal reflux disease and hiatal hernia who presented initially for management of acute respiratory failure and unresponsiveness. Pt likely with flash pulmonary edema related to her acute MI. Pt underwent left heart catheterization demonstrating severe three-vessel coronary artery disease and mildly reduced left ventricular function. She underwent three-vessel coronary artery bypass grafting 12/2. She underwent SLP evaluation on December 22, 2020 with findings of moderate aspiration risk, moderate pharyngeal dysphagia.  She was made NPO. Cortrak was placed. Rehabilitation services for functional deficits secondary to recent critical illness, open heart surgery.  Cortrak NGT replaced today. Tip of tube at the pylorus per abdominal x-ray. Pt currently has Vital 1.5 cal formula ordered. Pt tolerating her tube feeds. Pt with no indication to continue on specialty hydrolyzed formula and will switch formula to standard tube feeding formula. Tube feeds to infuse for 20 hours in a day to allow for feeds to be held for up to 4 hours for therapy. Labs and  medications reviewed.   Diet Order:   Diet Order     None       EDUCATION NEEDS:   Not appropriate for education at this time  Skin:  Skin Assessment: Skin Integrity Issues: Skin Integrity Issues:: Incisions Incisions: chest, L leg  Last BM:  12/7  Height:   Ht Readings from Last 1 Encounters:  01/25/21 5\' 7"  (1.702 m)    Weight:   Wt Readings from Last 1 Encounters:  01/25/21 87.3 kg   BMI:  Body mass index is 30.14 kg/m.  Estimated Nutritional Needs:   Kcal:  1800-2000  Protein:  90-110 grams  Fluid:  >/= 1.5 L/day   Corrin Parker, MS, RD, LDN RD pager number/after hours weekend pager number on Amion.

## 2021-01-25 NOTE — Progress Notes (Signed)
CARDIAC REHAB PHASE I   D/c education completed with pt and family. Reviewed site care and restrictions. Encouraged continued IS use and sternal precautions. Will refer to CRP II [place] with knowledge pt is d/cing to CIR.  [phone] Rufina Falco, RN BSN 01/25/2021 12:06 PM

## 2021-01-25 NOTE — Care Management Important Message (Signed)
Important Message  Patient Details  Name: Morgan Daniels MRN: [id number] Date of Birth: 1960-05-03   Medicare Important Message Given:  Yes     Danel Studzinski 01/25/2021, 1:21 PM

## 2021-01-25 NOTE — Procedures (Signed)
Cortrak ? ?Tube Type:  Cortrak - 43 inches ?Tube Location:  Left nare ?Initial Placement:  Stomach ?Secured by: Bridle ?Technique Used to Measure Tube Placement:  Marking at nare/corner of mouth ?Cortrak Secured At:  75 cm ? ?Cortrak Tube Team Note: ? ?Consult received to place a Cortrak feeding tube.  ? ?X-ray is required, abdominal x-ray has been ordered by the Cortrak team. Please confirm tube placement before using the Cortrak tube.  ? ?If the tube becomes dislodged please keep the tube and contact the Cortrak team at [email or web address] (password [email or web address]) for replacement.  ?If after hours and replacement cannot be delayed, place a NG tube and confirm placement with an abdominal x-ray.  ? ? ?Rane Dumm MS, RD, LDN ?Please refer to AMION for RD and/or RD on-call/weekend/after hours pager ? ? ?

## 2021-01-25 NOTE — Progress Notes (Signed)
Inpatient Rehabilitation Admission Medication Review by a Pharmacist  A complete drug regimen review was completed for this patient to identify any potential clinically significant medication issues.  High Risk Drug Classes Is patient taking? Indication by Medication  Antipsychotic No   Anticoagulant Yes Lovenox- VTE prophylaxis  Antibiotic No   Opioid Yes OxyIR- pain  Antiplatelet Yes Baby aspirin, plavix- CVA prophylaxis  Hypoglycemics/insulin Yes Sliding scale insulin, semglee- T2DM  Vasoactive Medication Yes Lopressor, amiodarone, norvasc- hypertension, rate control  Chemotherapy No   Other No      Type of Medication Issue Identified Description of Issue Recommendation(s)  Drug Interaction(s) (clinically significant)     Duplicate Therapy     Allergy     No Medication Administration End Date     Incorrect Dose     Additional Drug Therapy Needed     Significant med changes from prior encounter (inform family/care partners about these prior to discharge).    Other  PTA med: bentyl, Lopid, HCTZ, Zanaflex Restart PTA meds when and if clinically necessary during CIR stay or at discharge    Clinically significant medication issues were identified that warrant physician communication and completion of prescribed/recommended actions by midnight of the next day:  No   Time spent performing this drug regimen review (minutes):  30  Reeta Kuk BS, PharmD, BCPS Clinical Pharmacist 01/25/2021 2:44 PM

## 2021-01-25 NOTE — TOC Transition Note (Signed)
Transition of Care [facility]) - CM/SW Discharge Note   Patient Details  Name: Morgan Daniels MRN: [id number] Date of Birth: [birth year]  [facility] CM/SW Contact:  Zenon Mayo, RN Phone Number: 01/25/2021, 11:46 AM   Clinical Narrative:    Patient will dc to CIR today, per Pamala Hurry they have a bed for patient.      Barriers to Discharge: Continued Medical Work up   Patient Goals and CMS Choice Patient states their goals for this hospitalization and ongoing recovery are:: to go to inpatient rehab   Choice offered to / list presented to : NA  Discharge Placement                       Discharge Plan and Services In-house Referral: NA Discharge Planning Services: CM Consult Post Acute Care Choice: IP Rehab                               Social Determinants of Health (SDOH) Interventions     Readmission Risk Interventions No flowsheet data found.

## 2021-01-25 NOTE — H&P (Signed)
Physical Medicine and Rehabilitation Admission H&P     Functional deficits secondary to recent critical illness, open heart surgery   HPI: Morgan Daniels is a 60 year old female who presented to [facility] emergency department on January 12, 2021 via EMS for management of acute respiratory failure and unresponsiveness.  She was admitted to critical care medicine and required intubation and mechanical ventilation.  Further work-up revealed ST depressions in V leads on EKG and serial troponins at approximately 3000.  Heparin infusion for non-ST elevation MI was initiated.  She was able to be weaned and extubated but developed respiratory distress again and was reintubated.   Cardiology was consulted and echocardiography performed revealed an estimated ejection fraction of 60 to 65%.  They assessed her as likely having flash pulmonary edema related to her acute MI.  She was taken to the cardiac catheterization lab and underwent left heart catheterization demonstrating severe three-vessel coronary artery disease and mildly reduced left ventricular function with estimated LV ejection fraction of 40%.  Intra-aortic balloon pump was placed.  She was then transferred to [facility] for further evaluation and management of her three-vessel coronary artery disease.  Critical care medicine was consulted.     Cardiothoracic surgery/Dr. Roxan Hockey was consulted.  Urgent coronary artery bypass graft surgery was recommended.  She remained intubated, mechanically ventilated and sedated.  Her illness was complicated by hypoxic respiratory failure>> multifactorial.  She developed multifocal pneumonia with possible aspiration.  Antibiotic course was completed.  She underwent three-vessel coronary artery bypass grafting on January 18, 2021 by Dr. Roxan Hockey.   Her IABP was removed on POD 1. She self-extubated on POD 2.  She underwent SLP evaluation on December 22, 2020 with findings  of moderate aspiration risk, moderate pharyngeal dysphagia.  She was made NPO.  Cortrak was placed and she was receiving Prosource twice daily.  The patient had a fall without acute injury on January 23, 2021 and Cortrak was inadvertently dislodged.  Follow-up speech therapy swallowing evaluation she was permitted to have small snacks of pures and honey thick liquid awaiting plan to replace nasogastric tube.. She is maintained on Plavix, aspirin and statin.  She is receiving Lovenox 40 mg daily for DVT prophylaxis. She will need inpatient physical medicine and rehabilitation services for functional deficits secondary to recent critical illness, open heart surgery.   Significant past medical history includes tobacco use, COPD, diabetes mellitus, hypertension, hyperlipidemia, prior stroke, gastroesophageal reflux disease and hiatal hernia. No history of renal disease.   Prior to admission, the patient lived independently at home with her husband.  Requiring minimal assist with upper extremity and maximal assist for lower extremity ADLs. Review of Systems  Constitutional:  Negative for chills and fever.  HENT:  Negative for hearing loss.   Eyes:  Negative for blurred vision and double vision.  Respiratory:  Positive for shortness of breath. Negative for cough.   Cardiovascular:  Positive for chest pain, palpitations and leg swelling.  Gastrointestinal:  Positive for constipation. Negative for heartburn, nausea and vomiting.       GERD  Genitourinary:  Negative for dysuria, flank pain and hematuria.  Musculoskeletal:  Positive for joint pain and myalgias.  Skin:  Negative for rash.  Psychiatric/Behavioral:  The patient has insomnia.   All other systems reviewed and are negative.     Past Medical History:  Diagnosis Date   Anxiety     Bilateral carotid artery stenosis 2014   Chronic kidney disease 2017  Current smoker     CVA (cerebral vascular accident) ([place]) 2013   Depression      Diverticulosis     Fatty liver     Fibromyalgia     GERD (gastroesophageal reflux disease)     Hiatal hernia     Hypercholesteremia     Hypertension     IBS (irritable bowel syndrome)     PAD (peripheral artery disease) ([facility])     Peptic ulcer     T2DM (type 2 diabetes mellitus) ([place])           Past Surgical History:  Procedure Laterality Date   CORONARY ARTERY BYPASS GRAFT N/A 01/18/2021    Procedure: CORONARY ARTERY BYPASS GRAFTING (CABG) x 4  USING LEFT INTERNAL MAMMARY ARTERY AND LEFT ENDOSCOPIC GREATER SAPHENOUS VEIN CONDUITS;  Surgeon: [name], MD;  Location: [place];  Service: Open Heart Surgery;  Laterality: N/A;   CORONARY/GRAFT ACUTE MI REVASCULARIZATION N/A 01/13/2021    Procedure: Coronary/Graft Acute MI Revascularization;  Surgeon: Isaias Cowman, MD;  Location: [place] [facility];  Service: Cardiovascular;  Laterality: N/A;   ENDOVEIN HARVEST OF GREATER SAPHENOUS VEIN Left 01/18/2021    Procedure: ENDOVEIN HARVEST OF GREATER SAPHENOUS VEIN;  Surgeon: [name], MD;  Location: [place];  Service: Open Heart Surgery;  Laterality: Left;   IABP INSERTION Right 01/13/2021    Procedure: IABP Insertion;  Surgeon: Isaias Cowman, MD;  Location: [place] [facility];  Service: Cardiovascular;  Laterality: Right;   LEFT HEART CATH AND CORONARY ANGIOGRAPHY N/A 01/13/2021    Procedure: LEFT HEART CATH AND CORONARY ANGIOGRAPHY;  Surgeon: Isaias Cowman, MD;  Location: [place] [facility];  Service: Cardiovascular;  Laterality: N/A;   TEE WITHOUT CARDIOVERSION N/A 01/18/2021    Procedure: TRANSESOPHAGEAL ECHOCARDIOGRAM (TEE);  Surgeon: [name], MD;  Location: [place];  Service: Open Heart Surgery;  Laterality: N/A;    History reviewed. No pertinent family history. Social History:  reports that she has been smoking cigarettes. She has been exposed to tobacco smoke. She has never used smokeless tobacco. No history on file for alcohol use  and drug use. Allergies:       Allergies  Allergen Reactions   Simvastatin        Diarrhea, nausea,vomiting   Lactose Intolerance (Gi) Diarrhea   Morphine And Related Itching   Latex            Medications Prior to Admission  Medication Sig Dispense Refill   aspirin EC 81 MG tablet Take 81 mg by mouth daily. Swallow whole.       clopidogrel (PLAVIX) 75 MG tablet Take 75 mg by mouth daily.       dicyclomine (BENTYL) 10 MG capsule Take 10 mg by mouth 3 (three) times daily before meals.       FLUoxetine (PROZAC) 40 MG capsule Take 80 mg by mouth daily.       gabapentin (NEURONTIN) 600 MG tablet Take 600 mg by mouth 3 (three) times daily.       gemfibrozil (LOPID) 600 MG tablet Take 600 mg by mouth 2 (two) times daily.       hydrochlorothiazide (HYDRODIURIL) 12.5 MG tablet Take 12.5 mg by mouth daily.       insulin degludec (TRESIBA FLEXTOUCH) 200 UNIT/ML FlexTouch Pen Inject 80 Units into the skin at bedtime.       metoprolol succinate (TOPROL-XL) 50 MG 24 hr tablet Take 50 mg by mouth daily.  NOVOLOG FLEXPEN 100 UNIT/ML FlexPen 20-26 Units 3 (three) times daily with meals.       rosuvastatin (CRESTOR) 10 MG tablet Take 10 mg by mouth daily.       tiZANidine (ZANAFLEX) 4 MG tablet Take 4 mg by mouth 2 (two) times daily as needed for muscle spasms (pain).       traMADol (ULTRAM) 50 MG tablet Take 50 mg by mouth 3 (three) times daily as needed for moderate pain.          Drug Regimen Review Drug regimen was reviewed and remains appropriate with no significant issues identified   Home: Home Living Family/patient expects to be discharged to:: Private residence Living Arrangements: Spouse/significant other Available Help at Discharge: Family, Available 24 hours/day Type of Home: House Home Access: Stairs to enter [employer] of Steps: 3-4 Entrance Stairs-Rails: Right Home Layout: One level Bathroom Shower/Tub: Multimedia programmer: Handicapped height Home  Equipment: Radio producer - single point, Civil engineer, contracting - built in, Engineer, manufacturing held shower head   Functional History: Prior Function Prior Level of Function : Independent/Modified Independent, Driving Mobility Comments: no use of AD for mobility ADLs Comments: reports Independence with ADLs, IADLs, driving and grocery shopping. Enjoys caring for dog (pitbull/sharpei mix)   Functional Status:  Mobility: Bed Mobility General bed mobility comments: Pt up in chair Transfers Overall transfer level: Needs assistance Equipment used: Rolling walker (2 wheels) Transfers: Sit to/from Stand Sit to Stand: Min assist Bed to/from chair/wheelchair/BSC transfer type:: Step pivot Step pivot transfers: Mod assist General transfer comment: Min A to rise from recliner, frequent cues to avoid pushing from armrests - eventually placed hands on RW to stand with momentum Ambulation/Gait Ambulation/Gait assistance: Min assist, +2 safety/equipment Gait Distance (Feet): 15 Feet Assistive device: Rollator (4 wheels) Gait Pattern/deviations: Step-through pattern, Decreased stride length, Trunk flexed, Narrow base of support General Gait Details: Assist for balance and support. Verbal cues to widen base of support with pt almost stepping on her own feet. Gait velocity: decr Gait velocity interpretation: <1.31 ft/sec, indicative of household ambulator   ADL: ADL Overall ADL's : Needs assistance/impaired Eating/Feeding: Sitting, Supervision/ safety Grooming: Supervision/safety, Sitting, Oral care Grooming Details (indicate cue type and reason): use of swab and mouth rinse Upper Body Bathing: Minimal assistance, Sitting Lower Body Bathing: Maximal assistance, Sit to/from stand Upper Body Dressing : Minimal assistance, Sitting Lower Body Dressing: Maximal assistance, Sit to/from stand Lower Body Dressing Details (indicate cue type and reason): able to bend to reach B feet to pull up socks. will need assist in standing due to  balance deficits Toileting- Clothing Manipulation and Hygiene: Maximal assistance, Sit to/from stand General ADL Comments: Pt benefits from cues for sternal precautions, improving sit to stand abilities though poor balance (even when supported in standing) with decreased proprioception of B feet   Cognition: Cognition Overall Cognitive Status: Impaired/Different from baseline Orientation Level: Oriented X4 Cognition Arousal/Alertness: Awake/alert Behavior During Therapy: Flat affect Overall Cognitive Status: Impaired/Different from baseline Area of Impairment: Memory, Following commands, Safety/judgement, Awareness, Problem solving Memory: Decreased recall of precautions, Decreased short-term memory Following Commands: Follows one step commands with increased time Safety/Judgement: Decreased awareness of deficits, Decreased awareness of safety Awareness: Emergent Problem Solving: Slow processing, Difficulty sequencing, Requires verbal cues General Comments: requires cues to recall and implement sternal precautions, pleasant and follows all directions. slower processing and benefits from increased time for tasks   Physical Exam: Blood pressure (!) 148/70, pulse 86, temperature 98.6 F (37 C), temperature source Oral,  resp. rate 14, height '5\' 6"'  (1.676 m), weight 86 kg, SpO2 93 %. Physical Exam Constitutional:      General: She is not in acute distress.    Appearance: Normal appearance.  HENT:     Head: Normocephalic and atraumatic.     Comments: Cortrak tube in place, O2 via [place]    Nose: Nose normal.     Mouth/Throat:     Mouth: Mucous membranes are moist.     Pharynx: Oropharynx is clear.  Eyes:     Extraocular Movements: Extraocular movements intact.     Conjunctiva/sclera: Conjunctivae normal.     Pupils: Pupils are equal, round, and reactive to light.  Cardiovascular:     Rate and Rhythm: Normal rate and regular rhythm.     Heart sounds: No murmur heard.   No gallop.   Pulmonary:     Effort: Pulmonary effort is normal. No respiratory distress.     Breath sounds: No wheezing.  Abdominal:     General: Bowel sounds are normal. There is no distension.     Palpations: Abdomen is soft.  Musculoskeletal:        General: Tenderness (chest wall tender) present. Normal range of motion.     Cervical back: Normal range of motion. Tr  LE edema Skin:    Comments: Midline chest incision clean and dry.  Neurological:     Mental Status: She is alert.     Comments: Patient is alert.  Sitting up in chair.  Makes eye contact with examiner.  Provides name and age.  Follows simple commands. Alert and oriented x 3. Normal insight and awareness. Intact Memory. Normal language and speech. Strong voice, no dysarthria. Cranial nerve exam unremarkable. UE 4/5 prox to distal. LE: 3/5 HF, 4-/5 KE, 4/5 ADF/PF. Sensory exam normal for light touch and pain in all 4 limbs. No limb ataxia or cerebellar signs. No abnormal tone appreciated.    Psychiatric:        Mood and Affect: Mood normal.        Behavior: Behavior normal.      Lab Results Last 48 Hours        Results for orders placed or performed during the hospital encounter of 01/13/21 (from the past 48 hour(s))  Glucose, capillary     Status: Abnormal    Collection Time: 01/22/21  2:14 PM  Result Value Ref Range    Glucose-Capillary 220 (H) 70 - 99 mg/dL      Comment: Glucose reference range applies only to samples taken after fasting for at least 8 hours.  Glucose, capillary     Status: Abnormal    Collection Time: 01/22/21  4:00 PM  Result Value Ref Range    Glucose-Capillary 185 (H) 70 - 99 mg/dL      Comment: Glucose reference range applies only to samples taken after fasting for at least 8 hours.  Glucose, capillary     Status: Abnormal    Collection Time: 01/22/21  7:27 PM  Result Value Ref Range    Glucose-Capillary 127 (H) 70 - 99 mg/dL      Comment: Glucose reference range applies only to samples taken after  fasting for at least 8 hours.  Glucose, capillary     Status: None    Collection Time: 01/22/21 11:19 PM  Result Value Ref Range    Glucose-Capillary 86 70 - 99 mg/dL      Comment: Glucose reference range applies only to samples taken after fasting for  at least 8 hours.  Glucose, capillary     Status: Abnormal    Collection Time: 01/23/21  4:22 AM  Result Value Ref Range    Glucose-Capillary 134 (H) 70 - 99 mg/dL      Comment: Glucose reference range applies only to samples taken after fasting for at least 8 hours.  .Cooxemetry Panel (carboxy, met, total hgb, O2 sat)     Status: Abnormal    Collection Time: 01/23/21  4:57 AM  Result Value Ref Range    Total hemoglobin 9.7 (L) 12.0 - 16.0 g/dL    O2 Saturation 61.0 %    Carboxyhemoglobin 1.3 0.5 - 1.5 %    Methemoglobin 1.0 0.0 - 1.5 %      Comment: Performed at [facility] [address] [place], [place] [address]  CBC     Status: Abnormal    Collection Time: 01/23/21  4:57 AM  Result Value Ref Range    WBC 14.1 (H) 4.0 - 10.5 K/uL    RBC 3.55 (L) 3.87 - 5.11 MIL/uL    Hemoglobin 9.5 (L) 12.0 - 15.0 g/dL    HCT 31.6 (L) 36.0 - 46.0 %    MCV 89.0 80.0 - 100.0 fL    MCH 26.8 26.0 - 34.0 pg    MCHC 30.1 30.0 - 36.0 g/dL    RDW 13.9 11.5 - 15.5 %    Platelets 429 (H) 150 - 400 K/uL    nRBC 0.0 0.0 - 0.2 %      Comment: Performed at [facility], [place] [address], [place], [place] [address]  Basic metabolic panel     Status: Abnormal    Collection Time: 01/23/21  4:57 AM  Result Value Ref Range    Sodium 146 (H) 135 - 145 mmol/L    Potassium 3.8 3.5 - 5.1 mmol/L    Chloride 107 98 - 111 mmol/L    CO2 32 22 - 32 mmol/L    Glucose, Bld 135 (H) 70 - 99 mg/dL      Comment: Glucose reference range applies only to samples taken after fasting for at least 8 hours.    BUN 35 (H) 6 - 20 mg/dL    Creatinine, Ser 0.65 0.44 - 1.00 mg/dL    Calcium 8.8 (L) 8.9 - 10.3 mg/dL    GFR, Estimated >60 >60 mL/min      Comment:  (NOTE) Calculated using the CKD-EPI Creatinine Equation (2021)      Anion gap 7 5 - 15      Comment: Performed at [facility] [address], [place], [place] [address]  Magnesium     Status: None    Collection Time: 01/23/21  4:57 AM  Result Value Ref Range    Magnesium 2.1 1.7 - 2.4 mg/dL      Comment: Performed at [facility] [address], [place], [place] [address]  Phosphorus     Status: None    Collection Time: 01/23/21  4:57 AM  Result Value Ref Range    Phosphorus 3.0 2.5 - 4.6 mg/dL      Comment: Performed at [facility] [address] [place], [place] [address]  Glucose, capillary     Status: Abnormal    Collection Time: 01/23/21  8:26 AM  Result Value Ref Range    Glucose-Capillary 122 (H) 70 - 99 mg/dL      Comment: Glucose reference range applies only to samples  taken after fasting for at least 8 hours.  Glucose, capillary     Status: Abnormal    Collection Time: 01/23/21 11:08 AM  Result Value Ref Range    Glucose-Capillary 191 (H) 70 - 99 mg/dL      Comment: Glucose reference range applies only to samples taken after fasting for at least 8 hours.  Glucose, capillary     Status: Abnormal    Collection Time: 01/23/21  4:29 PM  Result Value Ref Range    Glucose-Capillary 174 (H) 70 - 99 mg/dL      Comment: Glucose reference range applies only to samples taken after fasting for at least 8 hours.  Glucose, capillary     Status: Abnormal    Collection Time: 01/23/21  8:33 PM  Result Value Ref Range    Glucose-Capillary 201 (H) 70 - 99 mg/dL      Comment: Glucose reference range applies only to samples taken after fasting for at least 8 hours.    Comment 1 Notify RN      Comment 2 Document in Chart    Glucose, capillary     Status: Abnormal    Collection Time: 01/23/21 11:22 PM  Result Value Ref Range    Glucose-Capillary 201 (H) 70 - 99 mg/dL      Comment: Glucose reference range applies only to samples taken after fasting for at least 8  hours.    Comment 1 Notify RN      Comment 2 Document in Chart    Glucose, capillary     Status: Abnormal    Collection Time: 01/24/21  4:41 AM  Result Value Ref Range    Glucose-Capillary 182 (H) 70 - 99 mg/dL      Comment: Glucose reference range applies only to samples taken after fasting for at least 8 hours.    Comment 1 Notify RN      Comment 2 Document in Chart    CBC     Status: Abnormal    Collection Time: 01/24/21  4:50 AM  Result Value Ref Range    WBC 16.9 (H) 4.0 - 10.5 K/uL    RBC 3.71 (L) 3.87 - 5.11 MIL/uL    Hemoglobin 10.2 (L) 12.0 - 15.0 g/dL    HCT 32.3 (L) 36.0 - 46.0 %    MCV 87.1 80.0 - 100.0 fL    MCH 27.5 26.0 - 34.0 pg    MCHC 31.6 30.0 - 36.0 g/dL    RDW 13.9 11.5 - 15.5 %    Platelets 488 (H) 150 - 400 K/uL    nRBC 0.0 0.0 - 0.2 %      Comment: Performed at [facility] [address] [place], [place] [address]  Basic metabolic panel     Status: Abnormal    Collection Time: 01/24/21  4:50 AM  Result Value Ref Range    Sodium 138 135 - 145 mmol/L      Comment: DELTA CHECK NOTED    Potassium 4.4 3.5 - 5.1 mmol/L    Chloride 102 98 - 111 mmol/L    CO2 29 22 - 32 mmol/L    Glucose, Bld 200 (H) 70 - 99 mg/dL      Comment: Glucose reference range applies only to samples taken after fasting for at least 8 hours.    BUN 25 (H) 6 - 20 mg/dL    Creatinine, Ser 0.74 0.44 - 1.00 mg/dL    Calcium 8.8 (L) 8.9 - 10.3  mg/dL    GFR, Estimated >60 >60 mL/min      Comment: (NOTE) Calculated using the CKD-EPI Creatinine Equation (2021)      Anion gap 7 5 - 15      Comment: Performed at [facility], [place] [address], [place], [place] [address]  Glucose, capillary     Status: Abnormal    Collection Time: 01/24/21  8:14 AM  Result Value Ref Range    Glucose-Capillary 167 (H) 70 - 99 mg/dL      Comment: Glucose reference range applies only to samples taken after fasting for at least 8 hours.       Imaging Results (Last 48 hours)  DG Chest Port 1  View   Result Date: 01/24/2021 CLINICAL DATA:  Pleural effusion. EXAM: PORTABLE CHEST 1 VIEW COMPARISON:  January 23, 2021. FINDINGS: Stable cardiomegaly. Mild bibasilar atelectasis is again noted with associated pleural effusions. Right-sided PICC line is unchanged in position. Bony thorax is unremarkable. Status post coronary bypass graft. IMPRESSION: Stable bibasilar atelectasis is noted with associated pleural effusions. Electronically Signed   By: Marijo Conception M.D.   On: 01/24/2021 08:49    DG Chest Port 1 View   Result Date: 01/23/2021 CLINICAL DATA:  Sore chest, post CABG EXAM: PORTABLE CHEST 1 VIEW COMPARISON:  01/22/2021 FINDINGS: Right PICC line remains present. Enteric tube is again partially imaged entering the stomach. Bilateral chest tubes no longer present. Similar cardiomegaly. Bibasilar atelectasis. Pulmonary vascular congestion. Probable small pleural effusions. No pneumothorax. IMPRESSION: Chest tube is no longer present.  No pneumothorax. Cardiomegaly with pulmonary vascular congestion. Similar bibasilar atelectasis and probable small pleural effusions. Electronically Signed   By: Macy Mis M.D.   On: 01/23/2021 09:00    DG Swallowing Func-Speech Pathology   Result Date: 01/22/2021 Table formatting from the original result was not included. Objective Swallowing Evaluation: Type of Study: MBS-Modified Barium Swallow Study  Patient Details Name: Morgan Daniels MRN: [id number] Date of Birth: 05/14/1960 Today's Date: 01/22/2021 Time: SLP Start Time (ACUTE ONLY): 2035 -SLP Stop Time (ACUTE ONLY): 1330 SLP Time Calculation (min) (ACUTE ONLY): 17 min Past Medical History: Past Medical History: Diagnosis Date  Anxiety   Bilateral carotid artery stenosis 2014  Chronic kidney disease 2017  Current smoker   CVA (cerebral vascular accident) ([place]) 2013  Depression   Diverticulosis   Fatty liver   Fibromyalgia   GERD (gastroesophageal reflux disease)   Hiatal hernia   Hypercholesteremia    Hypertension   IBS (irritable bowel syndrome)   PAD (peripheral artery disease) ([place])   Peptic ulcer   T2DM (type 2 diabetes mellitus) ([place])  Past Surgical History: Past Surgical History: Procedure Laterality Date  CORONARY ARTERY BYPASS GRAFT N/A 01/18/2021  Procedure: CORONARY ARTERY BYPASS GRAFTING (CABG) x 4  USING LEFT INTERNAL MAMMARY ARTERY AND LEFT ENDOSCOPIC GREATER SAPHENOUS VEIN CONDUITS;  Surgeon: [name], MD;  Location: [facility];  Service: Open Heart Surgery;  Laterality: N/A;  CORONARY/GRAFT ACUTE MI REVASCULARIZATION N/A 01/13/2021  Procedure: Coronary/Graft Acute MI Revascularization;  Surgeon: Isaias Cowman, MD;  Location: [facility];  Service: Cardiovascular;  Laterality: N/A;  ENDOVEIN HARVEST OF GREATER SAPHENOUS VEIN Left 01/18/2021  Procedure: ENDOVEIN HARVEST OF GREATER SAPHENOUS VEIN;  Surgeon: [name], MD;  Location: [facility];  Service: Open Heart Surgery;  Laterality: Left;  IABP INSERTION Right 01/13/2021  Procedure: IABP Insertion;  Surgeon: Isaias Cowman, MD;  Location: [facility];  Service: Cardiovascular;  Laterality: Right;  LEFT HEART CATH AND CORONARY ANGIOGRAPHY N/A 01/13/2021  Procedure: LEFT HEART CATH AND CORONARY ANGIOGRAPHY;  Surgeon: Isaias Cowman, MD;  Location: [facility];  Service: Cardiovascular;  Laterality: N/A;  TEE WITHOUT CARDIOVERSION N/A 01/18/2021  Procedure: TRANSESOPHAGEAL ECHOCARDIOGRAM (TEE);  Surgeon: [name], MD;  Location: [facility];  Service: Open Heart Surgery;  Laterality: N/A; HPI: Pt is a 60 yo female who [facility] ED on 01/12/2021 via EMS due to acute respiratory failure and unresponsiveness. ETT 11/26-11/27; reintubated 11/27 with CODE STEMI and extubated 11/30; developed stridor and vomiting with concern for aspiration so reintubated 11/30 until self-extubated 12/4. Pt s/p CABG x4 on 12/2. PMH includes: PAD, HTN, HLD, current tobacco abuse, COPD, DM2, CVA,  GERD, hiatal hernia, anxiety  Subjective: alert, denies h/o dysphagia  Recommendations for follow up therapy are one component of a multi-disciplinary discharge planning process, led by the attending physician.  Recommendations may be updated based on patient status, additional functional criteria and insurance authorization. Assessment / Plan / Recommendation Clinical Impressions 01/22/2021 Clinical Impression Pt presents with a moderate pharyngeal more than oral dysphagia, although she does have mildly reduced lingual propulsion with small amounts of lingual residue with solids. Pharyngeally she has reduced hyolaryngeal excursion, base of tongue retraction, and pharyngeal squeeze. Subsequently this results in incomplete epiglottic inversion and laryngeal vestibule closure, with possible post-extubation choices also impacting glottic closure given dysphonia. Residue increases as trials become thicker, leaving more diffuse residue with thicker liquids and solids. She performs a second swallow spontaneously that reduces but doesn't always eliminate this residue. Boluses enter into her airway before the swallow when she is not able to achieve adequate closure, aspirating thin liquids most consistently compared to other consistencies, although penetration is deep with thicker liquids and resulted in aspiration with honey thick liquids via cup x1 as well. A chin tuck does not increase airway protection or reduce residue. Pt has the most airway protection with purees and honey thick liquids by spoon, although risk for aspiration is still present given the amount of residue in her pharynx combined also with her mentation. Considering her recent cardiac surgery and repeated intubations, recommend initiating snacks only of purees and honey thick liquids by spoon to allow her to start more gradually trying POs to increase use of her swallowing musculature. SLP will also f/u for potential pharyngeal exercises. SLP Visit  Diagnosis Dysphagia, oropharyngeal phase (R13.12) Attention and concentration deficit following -- Frontal lobe and executive function deficit following -- Impact on safety and function Moderate aspiration risk   Treatment Recommendations 01/22/2021 Treatment Recommendations Therapy as outlined in treatment plan below   Prognosis 01/22/2021 Prognosis for Safe Diet Advancement Good Barriers to Reach Goals Cognitive deficits Barriers/Prognosis Comment -- Diet Recommendations 01/22/2021 SLP Diet Recommendations NPO;Alternative means - temporary;Other (Comment) Liquid Administration via Spoon Medication Administration Via alternative means Compensations Slow rate;Small sips/bites;Effortful swallow;Multiple dry swallows after each bite/sip Postural Changes Seated upright at 90 degrees   Other Recommendations 01/22/2021 Recommended Consults -- Oral Care Recommendations Oral care QID Other Recommendations Have oral suction available Follow Up Recommendations Acute inpatient rehab (3hours/day) Assistance recommended at discharge Intermittent Supervision/Assistance Functional Status Assessment Patient has had a recent decline in their functional status and demonstrates the ability to make significant improvements in function in a reasonable and predictable amount of time. Frequency and Duration  01/22/2021 Speech Therapy Frequency (ACUTE ONLY) min 2x/week Treatment Duration 2 weeks   Oral Phase 01/22/2021 Oral Phase Impaired Oral - Pudding Teaspoon --  Oral - Pudding Cup -- Oral - Honey Teaspoon WFL Oral - Honey Cup WFL Oral - Nectar Teaspoon WFL Oral - Nectar Cup WFL Oral - Nectar Straw -- Oral - Thin Teaspoon -- Oral - Thin Cup WFL Oral - Thin Straw -- Oral - Puree Weak lingual manipulation;Lingual/palatal residue Oral - Mech Soft -- Oral - Regular Lingual/palatal residue Oral - Multi-Consistency -- Oral - Pill -- Oral Phase - Comment --  Pharyngeal Phase 01/22/2021 Pharyngeal Phase Impaired Pharyngeal- Pudding Teaspoon --  Pharyngeal -- Pharyngeal- Pudding Cup -- Pharyngeal -- Pharyngeal- Honey Teaspoon Reduced pharyngeal peristalsis;Reduced epiglottic inversion;Reduced anterior laryngeal mobility;Reduced airway/laryngeal closure;Reduced tongue base retraction;Pharyngeal residue - pyriform;Penetration/Aspiration before swallow;Penetration/Apiration after swallow;Pharyngeal residue - valleculae Pharyngeal Material enters airway, remains ABOVE vocal cords and not ejected out Pharyngeal- Honey Cup Reduced pharyngeal peristalsis;Reduced epiglottic inversion;Reduced anterior laryngeal mobility;Reduced airway/laryngeal closure;Reduced tongue base retraction;Pharyngeal residue - pyriform;Penetration/Aspiration before swallow;Penetration/Apiration after swallow;Pharyngeal residue - valleculae Pharyngeal Material enters airway, passes BELOW cords without attempt by patient to eject out (silent aspiration) Pharyngeal- Nectar Teaspoon Reduced pharyngeal peristalsis;Reduced epiglottic inversion;Reduced anterior laryngeal mobility;Reduced airway/laryngeal closure;Reduced tongue base retraction;Pharyngeal residue - pyriform;Penetration/Aspiration before swallow;Penetration/Apiration after swallow;Pharyngeal residue - valleculae Pharyngeal Material enters airway, CONTACTS cords and not ejected out Pharyngeal- Nectar Cup Reduced pharyngeal peristalsis;Reduced epiglottic inversion;Reduced anterior laryngeal mobility;Reduced airway/laryngeal closure;Reduced tongue base retraction;Pharyngeal residue - pyriform;Penetration/Aspiration before swallow;Penetration/Apiration after swallow;Pharyngeal residue - valleculae Pharyngeal Material enters airway, CONTACTS cords and not ejected out Pharyngeal- Nectar Straw -- Pharyngeal -- Pharyngeal- Thin Teaspoon -- Pharyngeal -- Pharyngeal- Thin Cup Reduced pharyngeal peristalsis;Reduced epiglottic inversion;Reduced anterior laryngeal mobility;Reduced airway/laryngeal closure;Reduced tongue base  retraction;Pharyngeal residue - pyriform;Penetration/Aspiration before swallow;Penetration/Apiration after swallow Pharyngeal Material enters airway, passes BELOW cords without attempt by patient to eject out (silent aspiration) Pharyngeal- Thin Straw -- Pharyngeal -- Pharyngeal- Puree Reduced pharyngeal peristalsis;Reduced epiglottic inversion;Reduced anterior laryngeal mobility;Reduced airway/laryngeal closure;Reduced tongue base retraction;Pharyngeal residue - pyriform;Pharyngeal residue - valleculae Pharyngeal -- Pharyngeal- Mechanical Soft -- Pharyngeal -- Pharyngeal- Regular Reduced pharyngeal peristalsis;Reduced epiglottic inversion;Reduced anterior laryngeal mobility;Reduced airway/laryngeal closure;Reduced tongue base retraction;Pharyngeal residue - pyriform;Pharyngeal residue - valleculae Pharyngeal -- Pharyngeal- Multi-consistency -- Pharyngeal -- Pharyngeal- Pill -- Pharyngeal -- Pharyngeal Comment --  Cervical Esophageal Phase  01/22/2021 Cervical Esophageal Phase WFL Pudding Teaspoon -- Pudding Cup -- Honey Teaspoon -- Honey Cup -- Nectar Teaspoon -- Nectar Cup -- Nectar Straw -- Thin Teaspoon -- Thin Cup -- Thin Straw -- Puree -- Mechanical Soft -- Regular -- Multi-consistency -- Pill -- Cervical Esophageal Comment -- Osie Bond., M.A. CCC-SLP Acute Rehabilitation Services Pager [phone] Office [phone] 01/22/2021, 4:49 PM                         BP (!) 155/66 (BP Location: Left Arm)   Pulse 70   Temp 98.3 F (36.8 C) (Oral)   Resp 17   Ht '5\' 6"'  (1.676 m)   Wt 86.7 kg   LMP  (LMP Unknown) Comment: pt intubated  SpO2 98%   BMI 30.85 kg/m   Medical Problem List and Plan: 1.  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  Antithrombotics: -DVT/anticoagulation:  Pharmaceutical: Lovenox 40 mg QD             -antiplatelet therapy: Plavix 75 mg  daily  and aspirin 81 mg QD 3. Pain Management: oxycodone IR 5/10 mg every 3 hours PRN, Neurontin 600 mg TID 4. Mood: continue Prozac, LCSW to provide emotional support             -antipsychotic agents: n/a             -nicotine patch ordered for nicotine craving 71m 5. Neuropsych: This patient is capable of making decisions on her own behalf. 6. Skin/Wound Care: sternal wound cdi, can get wet.  7. Fluids/Electrolytes/Nutrition: routine I/Os and follow-up chemistries 8. Hypertension: BP stable, rate controlled, continue Pacerone, Norvasc, Lopressor             -bp controlled at present 9.Hyperlipidemia: continue Lipitor 10. Diabetes mellitus type 2: continue insuline regimen and CBG checks             -cbg's well controlled currently 11. COPD: continue Anoro Ellipta (prescribe at discharge) 12. Tobacco use: provide nicotine patch, smoking cessation counseling 13. PUD/GERD: continue PPI 14.  Acute blood loss anemia/leukocytosis.  Follow-up chemistries             -no signs of blood loss on exam 15.  Dysphagia.  Silent aspiration during recent MBS, needed cues to observe precautions with SLP, wet voice quality observed with honey thru cup. -Pt is NPO with snacks of purees/honey liquids by teaspoon only             -TF per cortrak 16. Foley             -has been in for diuresis             -recent hematuria d/t foley trauma             -remove foley in AM tomorrow and begin voiding trial.     SBarbie Banner PA-C  01/24/2021   I have personally performed a face to face diagnostic evaluation of this patient and formulated the key components of the plan.  Additionally, I have personally reviewed laboratory data, imaging studies, as well as relevant notes and concur with the physician assistant's documentation above.  The patient's status has not changed from the original H&P.  Any changes in documentation from the acute care chart have been noted above.  ZMeredith Staggers MD, FMellody Drown

## 2021-01-25 NOTE — Progress Notes (Signed)
[name], MD  Physician Physical Medicine and Rehabilitation PMR Pre-admission    Signed Date of Service:  01/25/2021  9:00 AM  Related encounter: Admission (Discharged) from 01/13/2021 in [place]      Show:Clear all '[x]' Written'[x]' Templated'[x]' Copied  Added by: '[x]' Cristina Gong, RN'[x]' [name], MD  '[]' Hover for details                                                                                                                                                                                                                                                                                                                                                                                                                                                             PMR Admission Coordinator Pre-Admission Assessment   Patient: Morgan Daniels is an 60 y.o., female MRN: [id number] DOB: May 08, 1960 Height: '5\' 6"'  (167.6 cm) Weight: 86.7 kg   Insurance Information HMO:     PPO:      PCP:      IPA:      80/20:      OTHER:  PRIMARY: Medicare      Policy#: [id number]      Subscriber: pt Benefits:  Phone #: passport one source     Name: 12/7 Eff. Date:  a 11/18/2011 b 06/17/2013     Deduct: $1556      Out of Pocket Max: none      Life Max: none CIR: 100%      SNF: 20 full days Outpatient: 80%     Co-Pay: 20% Home Health: 1005      Co-Pay: none DME: 80%     Co-Pay: 20% Providers: pt choice  SECONDARY: none         Financial Counselor:       Phone#:    The Therapist, art Information Summary" for patients in Inpatient Rehabilitation Facilities with attached "Privacy Act [place] Records" was provided and verbally reviewed with: Patient and Family   Emergency Contact Information Contact  Information       Name Relation Home Work Mobile    Burford,Donald Spouse     [phone]         Current Medical History  Patient Admitting Diagnosis: debility s/p CABG   History of Present Illness: Morgan Daniels is a 60 year old female who presented to [facility] emergency department on January 12, 2021 via EMS for management of acute respiratory failure and unresponsiveness.  She was admitted to critical care medicine and required intubation and mechanical ventilation.  Further work-up revealed ST depressions in V leads on EKG and serial troponins at approximately 3000.  Heparin infusion for non-ST elevation MI was initiated.  She was able to be weaned and extubated but developed respiratory distress again and was reintubated.   Cardiology was consulted and echocardiography performed revealed an estimated ejection fraction of 60 to 65%.  They assessed her as likely having flash pulmonary edema related to her acute MI.  She was taken to the cardiac catheterization lab and underwent left heart catheterization demonstrating severe three-vessel coronary artery disease and mildly reduced left ventricular function with estimated LV ejection fraction of 40%.  Intra-aortic balloon pump was placed.  She was then transferred to [facility] for further evaluation and management of her three-vessel coronary artery disease.  Critical care medicine was consulted.     Cardiothoracic surgery/Dr. Roxan Hockey was consulted.  Urgent coronary artery bypass graft surgery was recommended.  She remained intubated, mechanically ventilated and sedated.  Her illness was complicated by hypoxic respiratory failure>> multifactorial.  She developed multifocal pneumonia with possible aspiration.  Antibiotic course was completed.  She underwent three-vessel coronary artery bypass grafting on January 18, 2021 by Dr. Roxan Hockey.   Her IABP was removed on POD 1. She self-extubated on POD 2.   She underwent SLP evaluation on December 22, 2020 with findings of moderate aspiration risk, moderate pharyngeal dysphagia.  She was made NPO.  Cortrak was placed and she was receiving Prosource twice daily.  The patient had a fall without acute injury on January 23, 2021 and Cortrak was inadvertently dislodged.  Follow-up speech therapy swallowing evaluation she was permitted to have small snacks of pures and honey thick liquid awaiting plan to replace nasogastric tube.. She is maintained on Plavix, aspirin and statin.  She is receiving Lovenox 40 mg daily for DVT prophylaxis. She will need inpatient physical medicine and rehabilitation services for functional deficits secondary to recent critical illness, open heart surgery.   Patient's medical record from [facility] has been reviewed by the rehabilitation admission coordinator and physician.   Past Medical History      Past Medical History:  Diagnosis Date   Anxiety     Bilateral carotid artery stenosis 2014   Chronic  kidney disease 2017   Current smoker     CVA (cerebral vascular accident) ([place]) 2013   Depression     Diverticulosis     Fatty liver     Fibromyalgia     GERD (gastroesophageal reflux disease)     Hiatal hernia     Hypercholesteremia     Hypertension     IBS (irritable bowel syndrome)     PAD (peripheral artery disease) ([place])     Peptic ulcer     T2DM (type 2 diabetes mellitus) ([place])      Has the patient had major surgery during 100 days prior to admission? Yes   Family History   family history is not on file.   Current Medications   Current Facility-Administered Medications:    0.9 %  sodium chloride infusion, 250 mL, Intravenous, PRN, [name], MD   albuterol (PROVENTIL) (2.5 MG/3ML) 0.083% nebulizer solution 2.5 mg, 2.5 mg, Nebulization, Q4H PRN, [name], MD   amiodarone (PACERONE) tablet 400 mg, 400 mg, Per NG tube, Daily, [name], MD, 400 mg at 01/25/21 8850    amLODipine (NORVASC) tablet 10 mg, 10 mg, Per Tube, Daily, [name], MD, 10 mg at 01/25/21 2774   aspirin chewable tablet 81 mg, 81 mg, Per Tube, Daily, [name], MD, 81 mg at 01/25/21 0936   atorvastatin (LIPITOR) tablet 80 mg, 80 mg, Per Tube, Daily, [name], MD, 80 mg at 01/25/21 0937   bacitracin ointment, , Topical, Daily, Barrett, Erin R, PA-C, Given at 01/25/21 0937   chlorhexidine (PERIDEX) 0.12 % solution 15 mL, 15 mL, Mouth Rinse, BID, [name], MD, 15 mL at 01/25/21 1287   Chlorhexidine Gluconate Cloth 2 % PADS 6 each, 6 each, Topical, Daily, [name], MD, 6 each at 01/25/21 [phone]   clopidogrel (PLAVIX) tablet 75 mg, 75 mg, Per Tube, Daily, [name], MD, 75 mg at 01/25/21 0936   docusate (COLACE) 50 MG/5ML liquid 200 mg, 200 mg, Per Tube, Daily PRN, [name], MD   enoxaparin (LOVENOX) injection 40 mg, 40 mg, Subcutaneous, QHS, [name], MD, 40 mg at 01/24/21 2147   feeding supplement (PROSource TF) liquid 45 mL, 45 mL, Per Tube, BID, [name], MD, 45 mL at 01/23/21 2100   feeding supplement (VITAL 1.5 CAL) liquid 1,000 mL, 1,000 mL, Per Tube, Continuous, [name], MD, Stopped at 01/24/21 0430   FLUoxetine (PROZAC) capsule 80 mg, 80 mg, Per Tube, Daily, [name], MD, 80 mg at 01/25/21 7209   food thickener (SIMPLYTHICK (HONEY/LEVEL 3/MODERATELY THICK)) 1 packet, 1 packet, Oral, PRN, [name], MD, 1 packet at 01/23/21 0844   gabapentin (NEURONTIN) tablet 600 mg, 600 mg, Per Tube, TID, [name], MD, 600 mg at 01/25/21 0937   CBG monitoring, , , 4x Daily, AC & HS **AND** insulin aspart (novoLOG) injection 0-24 Units, 0-24 Units, Subcutaneous, TID WC, [name], MD, 2 Units at 01/25/21 0728   insulin aspart (novoLOG) injection 6 Units, 6 Units, Subcutaneous, Q4H, [name], MD, 6 Units at 01/23/21 2335    insulin glargine-yfgn (SEMGLEE) injection 40 Units, 40 Units, Subcutaneous, Daily, [name], MD, 40 Units at 01/25/21 0936   magnesium hydroxide (MILK OF MAGNESIA) suspension 30 mL, 30 mL, Oral, Daily PRN, [name], MD   MEDLINE mouth rinse, 15 mL, Mouth Rinse, q12n4p, [name], MD, 15 mL at 01/24/21 1157  metoprolol tartrate (LOPRESSOR) injection 2.5-5 mg, 2.5-5 mg, Intravenous, Q2H PRN, [name], MD, 2.5 mg at 01/21/21 0450   metoprolol tartrate (LOPRESSOR) tablet 25 mg, 25 mg, Per Tube, BID, 25 mg at 01/25/21 0937 **OR** [DISCONTINUED] metoprolol tartrate (LOPRESSOR) 25 mg/10 mL oral suspension 12.5 mg, 12.5 mg, Per Tube, BID, Roddenberry, Myron G, PA-C, 12.5 mg at 01/20/21 0844   nicotine (NICODERM CQ - dosed in mg/24 hours) patch 14 mg, 14 mg, Transdermal, Daily, Roddenberry, Myron G, PA-C   ondansetron (ZOFRAN) injection 4 mg, 4 mg, Intravenous, Q6H PRN, [name], MD, 4 mg at 01/24/21 1157   oxyCODONE (Oxy IR/ROXICODONE) immediate release tablet 5-10 mg, 5-10 mg, Per Tube, Q3H PRN, [name], MD, 10 mg at 01/25/21 0404   pantoprazole sodium (PROTONIX) 40 mg/20 mL oral suspension 40 mg, 40 mg, Per Tube, Daily, [name], MD, 40 mg at 01/23/21 6222   simethicone (MYLICON) 40 LN/9.8XQ suspension 40 mg, 40 mg, Per Tube, Q6H PRN, [name], MD, 40 mg at 01/22/21 0839   sodium chloride flush (NS) 0.9 % injection 3 mL, 3 mL, Intravenous, Q12H, [name], MD, 3 mL at 01/25/21 0939   sodium chloride flush (NS) 0.9 % injection 3 mL, 3 mL, Intravenous, PRN, [name], MD   umeclidinium-vilanterol (ANORO ELLIPTA) 62.5-25 MCG/ACT 1 puff, 1 puff, Inhalation, Daily, [name], MD, 1 puff at 01/25/21 0847   Patients Current Diet:  Diet Order       None       Cortrak to replaced 12/9    Precautions / Restrictions Precautions Precautions: Fall, Sternal, Other  (comment) Precaution Booklet Issued: Yes (comment) Precaution Comments: monitor O2 (does not wear at baseline) Restrictions Weight Bearing Restrictions: Yes (Sternal Precautions) RLE Weight Bearing:  (sternal precautions)    Has the patient had 2 or more falls or a fall with injury in the past year? No   Prior Activity Level Limited Community (1-2x/wk): Independent   Prior Functional Level Self Care: Did the patient need help bathing, dressing, using the toilet or eating? Independent   Indoor Mobility: Did the patient need assistance with walking from room to room (with or without device)? Independent   Stairs: Did the patient need assistance with internal or external stairs (with or without device)? Independent   Functional Cognition: Did the patient need help planning regular tasks such as shopping or remembering to take medications? Independent   Patient Information Are you of Hispanic, Latino/a,or Spanish origin?: A. No, not of Hispanic, Latino/a, or Spanish origin What is your race?: A. White Do you need or want an interpreter to communicate with a doctor or health care staff?: 0. No   Patient's Response To:  Health Literacy and Transportation Is the patient able to respond to health literacy and transportation needs?: Yes Health Literacy - How often do you need to have someone help you when you read instructions, pamphlets, or other written material from your doctor or pharmacy?: Never In the past 12 months, has lack of transportation kept you from medical appointments or from getting medications?: No In the past 12 months, has lack of transportation kept you from meetings, work, or from getting things needed for daily living?: No   Development worker, international aid / [place] Devices/Equipment: None Home Equipment: Cane - single point, Shower seat - built in, Engineer, manufacturing held shower head   Prior Device Use: Indicate devices/aids used by the patient prior to current illness,  exacerbation  or injury? None of the above   Current Functional Level Cognition   Overall Cognitive Status: Impaired/Different from baseline Orientation Level: Oriented X4 Following Commands: Follows one step commands inconsistently, Follows one step commands with increased time Safety/Judgement: Decreased awareness of deficits, Decreased awareness of safety General Comments: multiple cues needed to recall and implement sternal percautions; max cues need during mobility for RW management and direction    Extremity Assessment (includes Sensation/Coordination)   Upper Extremity Assessment: Overall WFL for tasks assessed  Lower Extremity Assessment: Defer to PT evaluation     ADLs   Overall ADL's : Needs assistance/impaired Eating/Feeding: Sitting, Supervision/ safety Grooming: Supervision/safety, Sitting, Oral care Grooming Details (indicate cue type and reason): use of swab and mouth rinse Upper Body Bathing: Minimal assistance, Sitting Lower Body Bathing: Maximal assistance, Sit to/from stand Upper Body Dressing : Minimal assistance, Sitting Lower Body Dressing: Maximal assistance, Sit to/from stand Lower Body Dressing Details (indicate cue type and reason): able to bend to reach B feet to pull up socks. will need assist in standing due to balance deficits Toileting- Clothing Manipulation and Hygiene: Maximal assistance, Sit to/from stand General ADL Comments: Pt benefits from cues for sternal precautions, improving sit to stand abilities though poor balance (even when supported in standing) with decreased proprioception of B feet     Mobility   Overal bed mobility: Needs Assistance Bed Mobility: Supine to Sit Supine to sit: Supervision General bed mobility comments: cues to maintain sternal percautions     Transfers   Overall transfer level: Needs assistance Equipment used: Rolling walker (2 wheels) Transfers: Sit to/from Stand Sit to Stand: Min guard Bed to/from  chair/wheelchair/BSC transfer type:: Step pivot Step pivot transfers: Mod assist General transfer comment: cues for hand placement; increased time to rise; min guard for safety     Ambulation / Gait / Stairs / Wheelchair Mobility   Ambulation/Gait Ambulation/Gait assistance: Mod assist, +2 safety/equipment, Min assist, Min guard Gait Distance (Feet): 180 Feet Assistive device: Rolling walker (2 wheels) Gait Pattern/deviations: Step-through pattern, Decreased stride length, Trunk flexed, Narrow base of support General Gait Details: Frequently losing balance with head turns; requiring up to mod assist to correct. Pt easily distracted, requring max cues for RW management, sequencing, and attention Gait velocity: decreased Gait velocity interpretation: <1.31 ft/sec, indicative of household ambulator     Posture / Balance Dynamic Sitting Balance Sitting balance - Comments: able to sit EOB without assist Balance Overall balance assessment: Needs assistance Sitting-balance support: No upper extremity supported, Feet supported Sitting balance-Leahy Scale: Fair Sitting balance - Comments: able to sit EOB without assist Postural control: Posterior lean Standing balance support: Bilateral upper extremity supported, During functional activity, Reliant on assistive device for balance Standing balance-Leahy Scale: Poor Standing balance comment: reliant on UE and external support; able to remove one hand from RW for posterior pericare     Special needs/care consideration [place] going to bathroom alone in 2c10 ; no injuries                                                              Cortrak replaced 12/9  Hematuria noted since indwelling catheter removed   Previous Home Environment  Living Arrangements: Spouse/significant other  Lives With: Spouse Available Help at Discharge:  (spouse to take FMLA and daughter to assist) Type of Home:  House Home Layout: One level Home Access: Stairs to enter Entrance Stairs-Rails: Right Entrance Stairs-Number of Steps: 3-4 Bathroom Shower/Tub: Multimedia programmer: Handicapped height Bathroom Accessibility: Yes How Accessible: Accessible via Union: No   Discharge Living Setting Plans for Discharge Living Setting: Patient's home, Lives with (comment) (spouse) Type of Home at Discharge: House Discharge Home Layout: One level Discharge Home Access: Stairs to enter Entrance Stairs-Rails: Right Entrance Stairs-Number of Steps: 3-4 Discharge Bathroom Shower/Tub: Walk-in shower Discharge Bathroom Toilet: Handicapped height Discharge Bathroom Accessibility: Yes How Accessible: Accessible via walker Does the patient have any problems obtaining your medications?: No   Social/Family/Support Systems Patient Roles: Spouse Contact Information: spouse, Elenore Rota Anticipated Caregiver: sposue to take FMLA and datr can assist Anticipated Caregiver's Contact Information: see contacts Ability/Limitations of Caregiver: spouse to take fmla Caregiver Availability: 24/7 Discharge Plan Discussed with Primary Caregiver: Yes Is Caregiver In Agreement with Plan?: Yes Does Caregiver/Family have Issues with Lodging/Transportation while Pt is in Rehab?: No   Goals Patient/Family Goal for Rehab: Mod I to supervision with PT, OT and SLP Expected length of stay: ELOS 7 to 10 days Pt/Family Agrees to Admission and willing to participate: Yes Program Orientation Provided & Reviewed with Pt/Caregiver Including Roles  & Responsibilities: Yes   Decrease burden of Care through IP rehab admission: n/a   Possible need for SNF placement upon discharge: not anticipated   Patient Condition: I have reviewed medical records from [facility] , spoken with  patient, spouse, and daughter. I met with patient at the bedside for inpatient rehabilitation assessment.  Patient will  benefit from ongoing PT, OT, and SLP, can actively participate in 3 hours of therapy a day 5 days of the week, and can make measurable gains during the admission.  Patient will also benefit from the coordinated team approach during an Inpatient Acute Rehabilitation admission.  The patient will receive intensive therapy as well as Rehabilitation physician, nursing, social worker, and care management interventions.  Due to bladder management, bowel management, safety, skin/wound care, disease management, medication administration, pain management, and patient education the patient requires 24 hour a day rehabilitation nursing.  The patient is currently min to mod assist with mobility and basic ADLs.  Discharge setting and therapy post discharge at home with home health is anticipated.  Patient has agreed to participate in the Acute Inpatient Rehabilitation Program and will admit today.   Preadmission Screen Completed By:  Cleatrice Burke, 01/25/2021 11:22 AM ______________________________________________________________________   Discussed status with Dr. Naaman Plummer on 01/25/2021 at 1121 and received approval for admission today.   Admission Coordinator:  Cleatrice Burke, RN, time  [id number] Date  01/25/2021    Assessment/Plan: Diagnosis: debility after CABG Does the need for close, 24 hr/day Medical supervision in concert with the patient's rehab needs make it unreasonable for this patient to be served in a less intensive setting? Yes Co-Morbidities requiring supervision/potential complications: dysphagia, CV considerations Due to bladder management, bowel management, safety, skin/wound care, disease management, medication administration, pain management, and patient education, does the patient require 24 hr/day rehab nursing? Yes Does the patient require coordinated care of a physician, rehab nurse, PT, OT, and SLP to address physical and functional deficits in the context of the above medical  diagnosis(es)? Yes  Addressing deficits in the following areas: balance, endurance, locomotion, strength, transferring, bowel/bladder control, bathing, dressing, feeding, grooming, toileting, swallowing, and psychosocial support Can the patient actively participate in an intensive therapy program of at least 3 hrs of therapy 5 days a week? Yes The potential for patient to make measurable gains while on inpatient rehab is excellent Anticipated functional outcomes upon discharge from inpatient rehab: modified independent PT, modified independent OT, modified independent SLP Estimated rehab length of stay to reach the above functional goals is: 7-10 days Anticipated discharge destination: Home 10. Overall Rehab/Functional Prognosis: excellent     MD Signature: [name], MD, [place] Director Rehabilitation Services 01/25/2021          Revision History                                    Note Details  Author [name], MD File Time 01/25/2021 11:45 AM  Author Type Physician Status Signed  Last Editor [name], MD Service Physical Medicine and [place] # [id number] Admit Date 01/25/2021

## 2021-01-25 NOTE — Progress Notes (Signed)
Inpatient Rehabilitation Admissions Coordinator   I have a CIR bed and can admit her today. I met with patient at bedside with her spouse and they are in agreement . I have alerted acute team and TOC and I will make the arrangements to admit today.  Danne Baxter, RN, MSN Rehab Admissions Coordinator [phone] 01/25/2021 10:48 AM

## 2021-01-25 NOTE — PMR Pre-admission (Signed)
PMR Admission Coordinator Pre-Admission Assessment  Patient: Morgan Daniels is an 60 y.o., female MRN: [id number] DOB: July 25, 1960 Height: '5\' 6"'  (167.6 cm) Weight: 86.7 kg  Insurance Information HMO:     PPO:      PCP:      IPA:      80/20:      OTHER:  PRIMARY: Medicare      Policy#: [id number]      Subscriber: pt Benefits:  Phone #: passport one source     Name: 12/7 Eff. Date: a 11/18/2011 b 06/17/2013     Deduct: $1556      Out of Pocket Max: none      Life Max: none CIR: 100%      SNF: 20 full days Outpatient: 80%     Co-Pay: 20% Home Health: 1005      Co-Pay: none DME: 80%     Co-Pay: 20% Providers: pt choice  SECONDARY: none        Financial Counselor:       Phone#:   The Therapist, art Information Summary" for patients in Inpatient Rehabilitation Facilities with attached "Privacy Act [employer] Records" was provided and verbally reviewed with: Patient and Family  Emergency Contact Information Contact Information     Name Relation Home Work Mobile   Redler,Donald Spouse   [phone]      Current Medical History  Patient Admitting Diagnosis: debility s/p CABG  History of Present Illness: Morgan Daniels is a 60 year old female who presented to [facility] emergency department on January 12, 2021 via EMS for management of acute respiratory failure and unresponsiveness.  She was admitted to critical care medicine and required intubation and mechanical ventilation.  Further work-up revealed ST depressions in V leads on EKG and serial troponins at approximately 3000.  Heparin infusion for non-ST elevation MI was initiated.  She was able to be weaned and extubated but developed respiratory distress again and was reintubated.   Cardiology was consulted and echocardiography performed revealed an estimated ejection fraction of 60 to 65%.  They assessed her as likely having flash pulmonary edema related to her acute MI.  She was taken to the cardiac  catheterization lab and underwent left heart catheterization demonstrating severe three-vessel coronary artery disease and mildly reduced left ventricular function with estimated LV ejection fraction of 40%.  Intra-aortic balloon pump was placed.  She was then transferred to [facility] for further evaluation and management of her three-vessel coronary artery disease.  Critical care medicine was consulted.     Cardiothoracic surgery/Dr. Roxan Hockey was consulted.  Urgent coronary artery bypass graft surgery was recommended.  She remained intubated, mechanically ventilated and sedated.  Her illness was complicated by hypoxic respiratory failure>> multifactorial.  She developed multifocal pneumonia with possible aspiration.  Antibiotic course was completed.  She underwent three-vessel coronary artery bypass grafting on January 18, 2021 by Dr. Roxan Hockey.   Her IABP was removed on POD 1. She self-extubated on POD 2.  She underwent SLP evaluation on December 22, 2020 with findings of moderate aspiration risk, moderate pharyngeal dysphagia.  She was made NPO.  Cortrak was placed and she was receiving Prosource twice daily.  The patient had a fall without acute injury on January 23, 2021 and Cortrak was inadvertently dislodged.  Follow-up speech therapy swallowing evaluation she was permitted to have small snacks of pures and honey thick liquid awaiting plan to replace nasogastric tube.. She is maintained on Plavix, aspirin and statin.  She is  receiving Lovenox 40 mg daily for DVT prophylaxis. She will need inpatient physical medicine and rehabilitation services for functional deficits secondary to recent critical illness, open heart surgery.   Patient's medical record from [facility] has been reviewed by the rehabilitation admission coordinator and physician.  Past Medical History  Past Medical History:  Diagnosis Date   Anxiety    Bilateral carotid artery stenosis 2014   Chronic  kidney disease 2017   Current smoker    CVA (cerebral vascular accident) ([place]) 2013   Depression    Diverticulosis    Fatty liver    Fibromyalgia    GERD (gastroesophageal reflux disease)    Hiatal hernia    Hypercholesteremia    Hypertension    IBS (irritable bowel syndrome)    PAD (peripheral artery disease) ([place])    Peptic ulcer    T2DM (type 2 diabetes mellitus) ([place])    Has the patient had major surgery during 100 days prior to admission? Yes  Family History   family history is not on file.  Current Medications  Current Facility-Administered Medications:    0.9 %  sodium chloride infusion, 250 mL, Intravenous, PRN, [name], MD   albuterol (PROVENTIL) (2.5 MG/3ML) 0.083% nebulizer solution 2.5 mg, 2.5 mg, Nebulization, Q4H PRN, [name], MD   amiodarone (PACERONE) tablet 400 mg, 400 mg, Per NG tube, Daily, [name], MD, 400 mg at 01/25/21 6945   amLODipine (NORVASC) tablet 10 mg, 10 mg, Per Tube, Daily, [name], MD, 10 mg at 01/25/21 0388   aspirin chewable tablet 81 mg, 81 mg, Per Tube, Daily, [name], MD, 81 mg at 01/25/21 0936   atorvastatin (LIPITOR) tablet 80 mg, 80 mg, Per Tube, Daily, [name], MD, 80 mg at 01/25/21 0937   bacitracin ointment, , Topical, Daily, Barrett, Erin R, PA-C, Given at 01/25/21 0937   chlorhexidine (PERIDEX) 0.12 % solution 15 mL, 15 mL, Mouth Rinse, BID, [name], MD, 15 mL at 01/25/21 8280   Chlorhexidine Gluconate Cloth 2 % PADS 6 each, 6 each, Topical, Daily, [name], MD, 6 each at 01/25/21 [phone]   clopidogrel (PLAVIX) tablet 75 mg, 75 mg, Per Tube, Daily, [name], MD, 75 mg at 01/25/21 0936   docusate (COLACE) 50 MG/5ML liquid 200 mg, 200 mg, Per Tube, Daily PRN, [name], MD   enoxaparin (LOVENOX) injection 40 mg, 40 mg, Subcutaneous, QHS, [name], MD, 40 mg at 01/24/21 2147   feeding supplement  (PROSource TF) liquid 45 mL, 45 mL, Per Tube, BID, [name], MD, 45 mL at 01/23/21 2100   feeding supplement (VITAL 1.5 CAL) liquid 1,000 mL, 1,000 mL, Per Tube, Continuous, [name], MD, Stopped at 01/24/21 0430   FLUoxetine (PROZAC) capsule 80 mg, 80 mg, Per Tube, Daily, [name], MD, 80 mg at 01/25/21 1791   food thickener (SIMPLYTHICK (HONEY/LEVEL 3/MODERATELY THICK)) 1 packet, 1 packet, Oral, PRN, [name], MD, 1 packet at 01/23/21 0844   gabapentin (NEURONTIN) tablet 600 mg, 600 mg, Per Tube, TID, [name], MD, 600 mg at 01/25/21 0937   CBG monitoring, , , 4x Daily, AC & HS **AND** insulin aspart (novoLOG) injection 0-24 Units, 0-24 Units, Subcutaneous, TID WC, [name], MD, 2 Units at 01/25/21 0728   insulin aspart (novoLOG) injection 6 Units, 6 Units, Subcutaneous, Q4H, [name], MD, 6 Units at 01/23/21 2335   insulin glargine-yfgn (  SEMGLEE) injection 40 Units, 40 Units, Subcutaneous, Daily, [name], MD, 40 Units at 01/25/21 0936   magnesium hydroxide (MILK OF MAGNESIA) suspension 30 mL, 30 mL, Oral, Daily PRN, [name], MD   MEDLINE mouth rinse, 15 mL, Mouth Rinse, q12n4p, [name], MD, 15 mL at 01/24/21 1157   metoprolol tartrate (LOPRESSOR) injection 2.5-5 mg, 2.5-5 mg, Intravenous, Q2H PRN, [name], MD, 2.5 mg at 01/21/21 0450   metoprolol tartrate (LOPRESSOR) tablet 25 mg, 25 mg, Per Tube, BID, 25 mg at 01/25/21 0937 **OR** [DISCONTINUED] metoprolol tartrate (LOPRESSOR) 25 mg/10 mL oral suspension 12.5 mg, 12.5 mg, Per Tube, BID, Roddenberry, Myron G, PA-C, 12.5 mg at 01/20/21 0844   nicotine (NICODERM CQ - dosed in mg/24 hours) patch 14 mg, 14 mg, Transdermal, Daily, Roddenberry, Myron G, PA-C   ondansetron (ZOFRAN) injection 4 mg, 4 mg, Intravenous, Q6H PRN, [name], MD, 4 mg at 01/24/21 1157   oxyCODONE (Oxy IR/ROXICODONE)  immediate release tablet 5-10 mg, 5-10 mg, Per Tube, Q3H PRN, [name], MD, 10 mg at 01/25/21 0404   pantoprazole sodium (PROTONIX) 40 mg/20 mL oral suspension 40 mg, 40 mg, Per Tube, Daily, [name], MD, 40 mg at 01/23/21 6294   simethicone (MYLICON) 40 TM/5.4YT suspension 40 mg, 40 mg, Per Tube, Q6H PRN, [name], MD, 40 mg at 01/22/21 0839   sodium chloride flush (NS) 0.9 % injection 3 mL, 3 mL, Intravenous, Q12H, [name], MD, 3 mL at 01/25/21 0939   sodium chloride flush (NS) 0.9 % injection 3 mL, 3 mL, Intravenous, PRN, [name], MD   umeclidinium-vilanterol (ANORO ELLIPTA) 62.5-25 MCG/ACT 1 puff, 1 puff, Inhalation, Daily, [name], MD, 1 puff at 01/25/21 0847  Patients Current Diet:  Diet Order     None     Cortrak to replaced 12/9   Precautions / Restrictions Precautions Precautions: Fall, Sternal, Other (comment) Precaution Booklet Issued: Yes (comment) Precaution Comments: monitor O2 (does not wear at baseline) Restrictions Weight Bearing Restrictions: Yes (Sternal Precautions) RLE Weight Bearing:  (sternal precautions)   Has the patient had 2 or more falls or a fall with injury in the past year? No  Prior Activity Level Limited Community (1-2x/wk): Independent  Prior Functional Level Self Care: Did the patient need help bathing, dressing, using the toilet or eating? Independent  Indoor Mobility: Did the patient need assistance with walking from room to room (with or without device)? Independent  Stairs: Did the patient need assistance with internal or external stairs (with or without device)? Independent  Functional Cognition: Did the patient need help planning regular tasks such as shopping or remembering to take medications? Independent  Patient Information Are you of Hispanic, Latino/a,or Spanish origin?: A. No, not of Hispanic, Latino/a, or Spanish origin What is your race?: A.  White Do you need or want an interpreter to communicate with a doctor or health care staff?: 0. No  Patient's Response To:  Health Literacy and Transportation Is the patient able to respond to health literacy and transportation needs?: Yes Health Literacy - How often do you need to have someone help you when you read instructions, pamphlets, or other written material from your doctor or pharmacy?: Never In the past 12 months, has lack of transportation kept you from medical appointments or from getting medications?: No In the past 12 months, has lack of transportation kept you from meetings, work, or from getting things needed for daily living?:  No  Home Assistive Devices / Equipment Home Assistive Devices/Equipment: None Home Equipment: Cane - single point, Shower seat - built in, [employer] held shower head  Prior Device Use: Indicate devices/aids used by the patient prior to current illness, exacerbation or injury? None of the above  Current Functional Level Cognition  Overall Cognitive Status: Impaired/Different from baseline Orientation Level: Oriented X4 Following Commands: Follows one step commands inconsistently, Follows one step commands with increased time Safety/Judgement: Decreased awareness of deficits, Decreased awareness of safety General Comments: multiple cues needed to recall and implement sternal percautions; max cues need during mobility for RW management and direction    Extremity Assessment (includes Sensation/Coordination)  Upper Extremity Assessment: Overall WFL for tasks assessed  Lower Extremity Assessment: Defer to PT evaluation    ADLs  Overall ADL's : Needs assistance/impaired Eating/Feeding: Sitting, Supervision/ safety Grooming: Supervision/safety, Sitting, Oral care Grooming Details (indicate cue type and reason): use of swab and mouth rinse Upper Body Bathing: Minimal assistance, Sitting Lower Body Bathing: Maximal assistance, Sit to/from stand Upper  Body Dressing : Minimal assistance, Sitting Lower Body Dressing: Maximal assistance, Sit to/from stand Lower Body Dressing Details (indicate cue type and reason): able to bend to reach B feet to pull up socks. will need assist in standing due to balance deficits Toileting- Clothing Manipulation and Hygiene: Maximal assistance, Sit to/from stand General ADL Comments: Pt benefits from cues for sternal precautions, improving sit to stand abilities though poor balance (even when supported in standing) with decreased proprioception of B feet    Mobility  Overal bed mobility: Needs Assistance Bed Mobility: Supine to Sit Supine to sit: Supervision General bed mobility comments: cues to maintain sternal percautions    Transfers  Overall transfer level: Needs assistance Equipment used: Rolling walker (2 wheels) Transfers: Sit to/from Stand Sit to Stand: Min guard Bed to/from chair/wheelchair/BSC transfer type:: Step pivot Step pivot transfers: Mod assist General transfer comment: cues for hand placement; increased time to rise; min guard for safety    Ambulation / Gait / Stairs / Wheelchair Mobility  Ambulation/Gait Ambulation/Gait assistance: Mod assist, +2 safety/equipment, Min assist, Min guard Gait Distance (Feet): 180 Feet Assistive device: Rolling walker (2 wheels) Gait Pattern/deviations: Step-through pattern, Decreased stride length, Trunk flexed, Narrow base of support General Gait Details: Frequently losing balance with head turns; requiring up to mod assist to correct. Pt easily distracted, requring max cues for RW management, sequencing, and attention Gait velocity: decreased Gait velocity interpretation: <1.31 ft/sec, indicative of household ambulator    Posture / Balance Dynamic Sitting Balance Sitting balance - Comments: able to sit EOB without assist Balance Overall balance assessment: Needs assistance Sitting-balance support: No upper extremity supported, Feet  supported Sitting balance-Leahy Scale: Fair Sitting balance - Comments: able to sit EOB without assist Postural control: Posterior lean Standing balance support: Bilateral upper extremity supported, During functional activity, Reliant on assistive device for balance Standing balance-Leahy Scale: Poor Standing balance comment: reliant on UE and external support; able to remove one hand from RW for posterior pericare    Special needs/care consideration [place] going to bathroom alone in 2c10 ; no injuries       Cortrak replaced 12/9      Hematuria noted since indwelling catheter removed  Previous Home Environment  Living Arrangements: Spouse/significant other  Lives With: Spouse Available Help at Discharge:  (spouse to take FMLA and daughter to assist) Type of Home: House Home Layout: One level Home Access: Stairs to enter Entrance Stairs-Rails: Right Entrance Stairs-Number of  Steps: 3-4 Bathroom Shower/Tub: Multimedia programmer: Handicapped height Bathroom Accessibility: Yes How Accessible: Accessible via walker [place]: No  Discharge Living Setting Plans for Discharge Living Setting: Patient's home, Lives with (comment) (spouse) Type of Home at Discharge: House Discharge Home Layout: One level Discharge Home Access: Stairs to enter Entrance Stairs-Rails: Right Entrance Stairs-Number of Steps: 3-4 Discharge Bathroom Shower/Tub: Walk-in shower Discharge Bathroom Toilet: Handicapped height Discharge Bathroom Accessibility: Yes How Accessible: Accessible via walker Does the patient have any problems obtaining your medications?: No  Social/Family/Support Systems Patient Roles: Spouse Contact Information: spouse, Elenore Rota Anticipated Caregiver: sposue to take FMLA and datr can assist Anticipated Caregiver's Contact Information: see contacts Ability/Limitations of Caregiver: spouse to take fmla Caregiver Availability: 24/7 Discharge Plan Discussed with Primary  Caregiver: Yes Is Caregiver In Agreement with Plan?: Yes Does Caregiver/Family have Issues with Lodging/Transportation while Pt is in Rehab?: No  Goals Patient/Family Goal for Rehab: Mod I to supervision with PT, OT and SLP Expected length of stay: ELOS 7 to 10 days Pt/Family Agrees to Admission and willing to participate: Yes Program Orientation Provided & Reviewed with Pt/Caregiver Including Roles  & Responsibilities: Yes  Decrease burden of Care through IP rehab admission: n/a  Possible need for SNF placement upon discharge: not anticipated  Patient Condition: I have reviewed medical records from [facility] , spoken with  patient, spouse, and daughter. I met with patient at the bedside for inpatient rehabilitation assessment.  Patient will benefit from ongoing PT, OT, and SLP, can actively participate in 3 hours of therapy a day 5 days of the week, and can make measurable gains during the admission.  Patient will also benefit from the coordinated team approach during an Inpatient Acute Rehabilitation admission.  The patient will receive intensive therapy as well as Rehabilitation physician, nursing, social worker, and care management interventions.  Due to bladder management, bowel management, safety, skin/wound care, disease management, medication administration, pain management, and patient education the patient requires 24 hour a day rehabilitation nursing.  The patient is currently min to mod assist with mobility and basic ADLs.  Discharge setting and therapy post discharge at home with home health is anticipated.  Patient has agreed to participate in the Acute Inpatient Rehabilitation Program and will admit today.  Preadmission Screen Completed By:  Cleatrice Burke, 01/25/2021 11:22 AM ______________________________________________________________________   Discussed status with Dr. Naaman Plummer on 01/25/2021 at 1121 and received approval for admission today.  Admission  Coordinator:  Cleatrice Burke, RN, time  6203 Date  01/25/2021   Assessment/Plan: Diagnosis: debility after CABG Does the need for close, 24 hr/day Medical supervision in concert with the patient's rehab needs make it unreasonable for this patient to be served in a less intensive setting? Yes Co-Morbidities requiring supervision/potential complications: dysphagia, CV considerations Due to bladder management, bowel management, safety, skin/wound care, disease management, medication administration, pain management, and patient education, does the patient require 24 hr/day rehab nursing? Yes Does the patient require coordinated care of a physician, rehab nurse, PT, OT, and SLP to address physical and functional deficits in the context of the above medical diagnosis(es)? Yes Addressing deficits in the following areas: balance, endurance, locomotion, strength, transferring, bowel/bladder control, bathing, dressing, feeding, grooming, toileting, swallowing, and psychosocial support Can the patient actively participate in an intensive therapy program of at least 3 hrs of therapy 5 days a week? Yes The potential for patient to make measurable gains while on inpatient rehab is excellent Anticipated functional outcomes upon  discharge from inpatient rehab: modified independent PT, modified independent OT, modified independent SLP Estimated rehab length of stay to reach the above functional goals is: 7-10 days Anticipated discharge destination: Home 10. Overall Rehab/Functional Prognosis: excellent   MD Signature: [name], MD, [place] Director Rehabilitation Services 01/25/2021

## 2021-01-26 DIAGNOSIS — R5381 Other malaise: Secondary | ICD-10-CM | POA: Diagnosis not present

## 2021-01-26 LAB — GLUCOSE, CAPILLARY
Glucose-Capillary: 142 mg/dL — ABNORMAL HIGH (ref 70–99)
Glucose-Capillary: 143 mg/dL — ABNORMAL HIGH (ref 70–99)
Glucose-Capillary: 147 mg/dL — ABNORMAL HIGH (ref 70–99)
Glucose-Capillary: 168 mg/dL — ABNORMAL HIGH (ref 70–99)
Glucose-Capillary: 179 mg/dL — ABNORMAL HIGH (ref 70–99)
Glucose-Capillary: 217 mg/dL — ABNORMAL HIGH (ref 70–99)

## 2021-01-26 MED ORDER — SODIUM CHLORIDE 0.9% FLUSH
10.0000 mL | INTRAVENOUS | Status: DC | PRN
  Administered 2021-01-26 – 2021-01-27 (×2): 30 mL

## 2021-01-26 MED ORDER — CHLORHEXIDINE GLUCONATE CLOTH 2 % EX PADS
6.0000 | MEDICATED_PAD | Freq: Every day | CUTANEOUS | Status: DC
  Administered 2021-01-26 – 2021-01-31 (×6): 6 via TOPICAL

## 2021-01-26 NOTE — Evaluation (Signed)
Physical Therapy Assessment and Plan  Patient Details  Name: Morgan Daniels MRN: [id number] Date of Birth: 1960/05/27  PT Diagnosis: Abnormality of gait, Cognitive deficits, Coordination disorder, Difficulty walking, Impaired cognition, Impaired sensation, Muscle weakness, and Pain in sternum Rehab Potential: Good ELOS: 7-10 days   Today's Date: 01/26/2021 PT Individual Time: 7001-7494 PT Individual Time Calculation (min): 69 min    Hospital Problem: Principal Problem:   Debility   Past Medical History:  Past Medical History:  Diagnosis Date   Anxiety    Arthritis    Bilateral carotid artery stenosis 2014   Carotid artery occlusion    Chronic kidney disease May 2017   UTI   Chronic kidney disease 2017   Current smoker    CVA (cerebral vascular accident) ([place]) 2013   Depression    Diabetes ([place])    Diverticulosis    Fatty liver    Fibromyalgia    GERD (gastroesophageal reflux disease)    H/O hiatal hernia    Hiatal hernia    Hypercholesteremia    Hypertension    IBS (irritable bowel syndrome)    PAD (peripheral artery disease) ([place])    Peptic ulcer    Plantar fasciitis    Stroke [facility]) Dec. 14,2013   Right side-ministroke   T2DM (type 2 diabetes mellitus) ([place])    Past Surgical History:  Past Surgical History:  Procedure Laterality Date   ABDOMINAL HYSTERECTOMY  [place]  2000, 2004   CAROTID ENDARTERECTOMY Left 05/06/12   CARPAL TUNNEL RELEASE Left 07/18/2015   Procedure: CARPAL TUNNEL RELEASE;  Surgeon: Earnestine Leys, MD;  Location: [facility] ORS;  Service: Orthopedics;  Laterality: Left;   CEREBRAL ANGIOGRAM Bilateral 05/03/2012   Procedure: CEREBRAL ANGIOGRAM;  Surgeon: Angelia Mould, MD;  Location: [facility] CATH LAB;  Service: Cardiovascular;  Laterality: Bilateral;   CHOLECYSTECTOMY  2001   COLONOSCOPY WITH PROPOFOL N/A 11/19/2018   Procedure: COLONOSCOPY WITH PROPOFOL;  Surgeon: Lucilla Lame, MD;  Location: [place];  Service: Endoscopy;   Laterality: N/A;  Diabetic - insulin   CORONARY ARTERY BYPASS GRAFT N/A 01/18/2021   Procedure: CORONARY ARTERY BYPASS GRAFTING (CABG) x 4  USING LEFT INTERNAL MAMMARY ARTERY AND LEFT ENDOSCOPIC GREATER SAPHENOUS VEIN CONDUITS;  Surgeon: [name], MD;  Location: [place];  Service: Open Heart Surgery;  Laterality: N/A;   CORONARY/GRAFT ACUTE MI REVASCULARIZATION N/A 01/13/2021   Procedure: Coronary/Graft Acute MI Revascularization;  Surgeon: Isaias Cowman, MD;  Location: [place] CV LAB;  Service: Cardiovascular;  Laterality: N/A;   ENDARTERECTOMY Left 05/06/2012   Procedure: ENDARTERECTOMY CAROTID;  Surgeon: Angelia Mould, MD;  Location: [facility];  Service: Vascular;  Laterality: Left;   ENDARTERECTOMY Right 08/09/2013   Procedure: ENDARTERECTOMY CAROTID-RIGHT;  Surgeon: Angelia Mould, MD;  Location: [place];  Service: Vascular;  Laterality: Right;   ENDOVEIN HARVEST OF GREATER SAPHENOUS VEIN Left 01/18/2021   Procedure: ENDOVEIN HARVEST OF GREATER SAPHENOUS VEIN;  Surgeon: [name], MD;  Location: [place];  Service: Open Heart Surgery;  Laterality: Left;   HERNIA REPAIR     IABP INSERTION Right 01/13/2021   Procedure: IABP Insertion;  Surgeon: Isaias Cowman, MD;  Location: [place] CV LAB;  Service: Cardiovascular;  Laterality: Right;   LEFT HEART CATH AND CORONARY ANGIOGRAPHY N/A 01/13/2021   Procedure: LEFT HEART CATH AND CORONARY ANGIOGRAPHY;  Surgeon: Isaias Cowman, MD;  Location: [place] CV LAB;  Service: Cardiovascular;  Laterality: N/A;   LOWER EXTREMITY ANGIOGRAPHY Left  01/03/2021   Procedure: LOWER EXTREMITY ANGIOGRAPHY;  Surgeon: Algernon Huxley, MD;  Location: [place] CV LAB;  Service: Cardiovascular;  Laterality: Left;   PATCH ANGIOPLASTY Left 05/06/2012   Procedure: WITH DACRON PATCH ANGIOPLASTY ;  Surgeon: Angelia Mould, MD;  Location: [place];  Service: Vascular;  Laterality: Left;   POLYPECTOMY  11/19/2018    Procedure: POLYPECTOMY;  Surgeon: Lucilla Lame, MD;  Location: [place];  Service: Endoscopy;;   SPINE SURGERY  2004   TEE WITHOUT CARDIOVERSION N/A 01/18/2021   Procedure: TRANSESOPHAGEAL ECHOCARDIOGRAM (TEE);  Surgeon: [name], MD;  Location: [place];  Service: Open Heart Surgery;  Laterality: N/A;   TONSILLECTOMY     TUBAL LIGATION      Assessment & Plan Clinical Impression: Patient is a 60 y.o. year old female who presented to [facility] emergency department on January 12, 2021 via EMS for management of acute respiratory failure and unresponsiveness.  She was admitted to critical care medicine and required intubation and mechanical ventilation.  Further work-up revealed ST depressions in V leads on EKG and serial troponins at approximately 3000.  Heparin infusion for non-ST elevation MI was initiated.  She was able to be weaned and extubated but developed respiratory distress again and was reintubated.   Cardiology was consulted and echocardiography performed revealed an estimated ejection fraction of 60 to 65%.  They assessed her as likely having flash pulmonary edema related to her acute MI.  She was taken to the cardiac catheterization lab and underwent left heart catheterization demonstrating severe three-vessel coronary artery disease and mildly reduced left ventricular function with estimated LV ejection fraction of 40%.  Intra-aortic balloon pump was placed.  She was then transferred to [facility] for further evaluation and management of her three-vessel coronary artery disease.  Critical care medicine was consulted.     Cardiothoracic surgery/Dr. Roxan Hockey was consulted.  Urgent coronary artery bypass graft surgery was recommended.  She remained intubated, mechanically ventilated and sedated.  Her illness was complicated by hypoxic respiratory failure>> multifactorial.  She developed multifocal pneumonia with possible  aspiration.  Antibiotic course was completed.  She underwent three-vessel coronary artery bypass grafting on January 18, 2021 by Dr. Roxan Hockey.   Her IABP was removed on POD 1. She self-extubated on POD 2.  She underwent SLP evaluation on December 22, 2020 with findings of moderate aspiration risk, moderate pharyngeal dysphagia.  She was made NPO.  Cortrak was placed and she was receiving Prosource twice daily.  The patient had a fall without acute injury on January 23, 2021 and Cortrak was inadvertently dislodged.  Follow-up speech therapy swallowing evaluation she was permitted to have small snacks of pures and honey thick liquid awaiting plan to replace nasogastric tube.. She is maintained on Plavix, aspirin and statin.  She is receiving Lovenox 40 mg daily for DVT prophylaxis. She will need inpatient physical medicine and rehabilitation services for functional deficits secondary to recent critical illness, open heart surgery.   Significant past medical history includes tobacco use, COPD, diabetes mellitus, hypertension, hyperlipidemia, prior stroke, gastroesophageal reflux disease and hiatal hernia. No history of renal disease.   Prior to admission, the patient lived independently at home with her husband.  Requiring minimal assist with upper extremity and maximal assist for lower extremity ADLs.  Patient currently requires min with mobility secondary to muscle weakness, decreased coordination, decreased attention to right, decreased attention, decreased awareness, decreased problem solving, decreased safety awareness, decreased memory, and  delayed processing, and decreased standing balance, decreased postural control, decreased balance strategies, and difficulty maintaining precautions.  Prior to hospitalization, patient was independent  with mobility and lived with Spouse in a House home.  Home access is 3-4Stairs to enter.  Patient will benefit from skilled PT intervention to maximize safe  functional mobility, minimize fall risk, and decrease caregiver burden for planned discharge home with intermittent assist.  Anticipate patient will benefit from follow up [facility] at discharge.  PT - End of Session Activity Tolerance: Tolerates 30+ min activity with multiple rests Endurance Deficit: Yes Endurance Deficit Description: on 6L of O2, decreases from 98%to 88-90% with activity PT Assessment Rehab Potential (ACUTE/IP ONLY): Good PT Barriers to Discharge: Decreased caregiver support;Inaccessible home environment;Weight bearing restrictions;Behavior;Other (comments) (Sternal precautions) PT Patient demonstrates impairments in the following area(s): Balance;Behavior;Endurance;Motor;Pain;Nutrition;Perception;Safety PT Transfers Functional Problem(s): Bed Mobility;Bed to Chair;Car;Furniture PT Locomotion Functional Problem(s): Ambulation;Wheelchair Mobility;Stairs PT Plan PT Intensity: Minimum of 1-2 x/day ,45 to 90 minutes PT Frequency: 5 out of 7 days PT Duration Estimated Length of Stay: 7-10 days PT Treatment/Interventions: Ambulation/gait training;DME/adaptive equipment instruction;Psychosocial support;UE/LE Strength taining/ROM;Balance/vestibular training;Skin care/wound management;UE/LE Coordination activities;Visual/perceptual remediation/compensation;Splinting/orthotics;Functional mobility training;Cognitive remediation/compensation;Community reintegration;Neuromuscular re-education;Stair training;Wheelchair propulsion/positioning;Therapeutic Activities;Pain management;Discharge planning;Disease management/prevention;Patient/family education;Therapeutic Exercise PT Transfers Anticipated Outcome(s): S* PT Locomotion Anticipated Outcome(s): CGA PT Recommendation Recommendations for Other Services: Neuropsych consult;Therapeutic Recreation consult Therapeutic Recreation Interventions: Pet therapy;Kitchen group;Stress management;Outing/community reintergration Follow Up Recommendations:  Home health PT Patient destination: Home Equipment Recommended: To be determined   PT Evaluation Precautions/Restrictions Precautions Precautions: Fall;Sternal;Other (comment) (Cortrak) Precaution Comments: monitor O2 (does not wear at baseline) Restrictions Weight Bearing Restrictions: Yes RLE Weight Bearing: Weight bearing as tolerated LLE Weight Bearing: Weight bearing as tolerated Other Position/Activity Restrictions: sternal precautions Pain Interference Pain Interference Pain Effect on Sleep: 1. Rarely or not at all Pain Interference with Therapy Activities: 1. Rarely or not at all Pain Interference with Day-to-Day Activities: 1. Rarely or not at all Home Living/Prior [place] Available Help at Discharge: Family;Available PRN/intermittently Type of Home: House Home Access: Stairs to enter [employer] of Steps: 3-4 Entrance Stairs-Rails: Can reach both Home Layout: One level Bathroom Shower/Tub: Multimedia programmer: Standard Bathroom Accessibility: Yes  Lives With: Spouse Prior Function Level of Independence: Independent with basic ADLs;Independent with gait;Independent with transfers  Able to Take Stairs?: Yes Driving: Yes Vocation: On disability Vision/Perception  Vision - History Ability to See in Adequate Light: 0 Adequate Perception Perception: Within Functional Limits Praxis Praxis: Intact  Cognition Overall Cognitive Status: Impaired/Different from baseline Arousal/Alertness: Awake/alert Orientation Level: Oriented X4 Year: 2022 Month: December Day of Week: Correct Attention: Selective Selective Attention: Impaired Selective Attention Impairment: Verbal complex Memory: Appears intact Immediate Memory Recall: Sock;Blue;Bed Memory Recall Sock: Without Cue Memory Recall Blue: Without Cue Memory Recall Bed: Without Cue Awareness: Impaired Awareness Impairment: Emergent impairment Problem Solving:  Impaired Problem Solving Impairment: Functional complex Safety/Judgment: Impaired Comments: Severe selective attention deficit, impulsive. No adherence to sternal precautions Sensation Sensation Light Touch: Appears Intact Hot/Cold: Appears Intact Proprioception: Appears Intact Stereognosis: Appears Intact Coordination Gross Motor Movements are Fluid and Coordinated: No Fine Motor Movements are Fluid and Coordinated: Yes Coordination and Movement Description: limited by sternal prec in UEs , lipomas on bottoms of feet, pt suspects gout Finger Nose Finger Test: Significant difficulty w/selective attention, moderate dysmetria LUE > RUE Heel Shin Test: [facility] Motor  Motor Motor: Other (comment) Motor - Skilled Clinical Observations: Generalized weakness, R inattention   Trunk/Postural Assessment  Cervical Assessment Cervical Assessment: Within  Functional Limits Thoracic Assessment Thoracic Assessment: Exceptions to [facility] (Kyphotic) Lumbar Assessment Lumbar Assessment: Exceptions to [facility] (Posterior pelvic tilt) Postural Control Postural Control: Within Functional Limits  Balance Balance Balance Assessed: Yes Static Sitting Balance Static Sitting - Balance Support: Bilateral upper extremity supported;Feet supported Static Sitting - Level of Assistance: 5: Stand by assistance Dynamic Sitting Balance Dynamic Sitting - Balance Support: During functional activity;Feet supported Dynamic Sitting - Level of Assistance: 4: Min assist Dynamic Sitting - Balance Activities: Lateral lean/weight shifting;Forward lean/weight shifting;Reaching for objects Static Standing Balance Static Standing - Balance Support: Bilateral upper extremity supported;During functional activity Static Standing - Level of Assistance: 5: Stand by assistance Dynamic Standing Balance Dynamic Standing - Balance Support: Bilateral upper extremity supported;During functional activity Dynamic Standing - Level of Assistance:  4: Min assist Dynamic Standing - Balance Activities: Forward lean/weight shifting;Reaching for objects;Reaching across midline;Lateral lean/weight shifting Extremity Assessment  RLE Assessment RLE Assessment: Exceptions to [facility] RLE Strength RLE Overall Strength: Within Functional Limits for tasks assessed Right Hip Flexion: 4-/5 Right Hip Extension: 4/5 Right Hip ABduction: 4/5 Right Hip ADduction: 4/5 Right Knee Flexion: 4/5 Right Knee Extension: 4/5 Right Ankle Dorsiflexion: 4/5 Right Ankle Plantar Flexion: 4/5 LLE Assessment LLE Assessment: Exceptions to [facility] LLE Strength LLE Overall Strength: Within Functional Limits for tasks assessed Left Hip Flexion: 4-/5 Left Hip Extension: 4/5 Left Hip ABduction: 4/5 Left Hip ADduction: 4/5 Left Knee Flexion: 3+/5 Left Knee Extension: 4+/5 Left Ankle Dorsiflexion: 4/5 Left Ankle Plantar Flexion: 4/5  Care Tool Care Tool Bed Mobility Roll left and right activity   Roll left and right assist level: Supervision/Verbal cueing    Sit to lying activity   Sit to lying assist level: Supervision/Verbal cueing    Lying to sitting on side of bed activity   Lying to sitting on side of bed assist level: the ability to move from lying on the back to sitting on the side of the bed with no back support.: Minimal Assistance - Patient > 75%     Care Tool Transfers Sit to stand transfer   Sit to stand assist level: Minimal Assistance - Patient > 75%    Chair/bed transfer   Chair/bed transfer assist level: Minimal Assistance - Patient > 75%     Toilet transfer   Assist Level: Minimal Assistance - Patient > 75%    Car transfer   Car transfer assist level: Minimal Assistance - Patient > 75%      Care Tool Locomotion Ambulation   Assist level: Minimal Assistance - Patient > 75% Assistive device: Walker-rolling Max distance: 75'  Walk 10 feet activity   Assist level: Minimal Assistance - Patient > 75% Assistive device: Walker-rolling    Walk 50 feet with 2 turns activity   Assist level: Minimal Assistance - Patient > 75% Assistive device: Walker-rolling  Walk 150 feet activity Walk 150 feet activity did not occur: Safety/medical concerns (Impulsiveness, fatigue)      Walk 10 feet on uneven surfaces activity Walk 10 feet on uneven surfaces activity did not occur: Safety/medical concerns (Impulsiveness, fatigue)      Stairs Stair activity did not occur: Safety/medical concerns (Impulsiveness, fatigue)        Walk up/down 1 step activity Walk up/down 1 step or curb (drop down) activity did not occur: Safety/medical concerns (Impulsiveness, fatigue)      Walk up/down 4 steps activity Walk up/down 4 steps activity did not occur: Safety/medical concerns (Impulsiveness, fatigue)      Walk up/down 12 steps activity  Walk up/down 12 steps activity did not occur: Safety/medical concerns (Impulsiveness, fatigue)      Pick up small objects from floor Pick up small object from the floor (from standing position) activity did not occur: Safety/medical concerns (Impulsiveness, fatigue)      Wheelchair Is the patient using a wheelchair?: Yes Type of Wheelchair: Manual   Wheelchair assist level: Dependent - Patient 0%    Wheel 50 feet with 2 turns activity   Assist Level: Dependent - Patient 0%  Wheel 150 feet activity   Assist Level: Dependent - Patient 0%    Refer to Care Plan for [place] [facility] PT Short Term Goal 1 (Week 1): STG = LTG due to ELOS  Recommendations for other services: Neuropsych and Therapeutic Recreation  Pet therapy, Kitchen group, Stress management, and Outing/community reintegration  Skilled Therapeutic Intervention Evaluation completed (see details above and below) with education on PT POC, CIR safety policies, 3 hour therapy requirement, goals and sternal precautions. Pt received supine in bed, on 5L O2, slouched to the end of the bed w/BLEs hanging off bedrail, daughter  present. Pt reported 4/10 pain in sternum and requested pain meds, nursing notified. Offered repositioning and distraction for pain modulation. Pt performed supine <> sit w/min A for trunk management to adhere to sternal precautions. Pt unable to teach back sternal precautions or adhere to precautions throughout session despite max verbal cues. Sit <>stand pivot from bed to [facility] w/min A and no AD and pt voided continently and performed clothing management w/S*. Noted hematuria, nursing notified. Sit <>stand pivot back to bed impulsively while therapist across room to grab brief. Reminded pt of CIR safety policies, which pt ignored. Obtained portable O2 tank and transferred pt to 6L O2 on portable tank and pt performed sit <>stand w/min A and poor hand placement, pulling herself up on RW and disregarding verbal cues. Reminded pt of her sternal precautions. Pt ambulated 64' w/RW and min A, close WC follow. Noted significant R inattention as pt frequently ran into wall or obstacles on R side, not correctable w/verbal or tactile cues. Pt frequently picked up RW and looked up at ceiling, requiring therapist to lower RW and redirect pt. Christmas trees in hallway are very distracting. Pt performed car transfer w/min A due to impulsively sitting in car while swinging car door closed. Pt then ambulated 75' back to room w/RW and min A, running over tree skirt in hallway and demonstrating significant R path deviations. Pt transferred back to wall O2 at 5L and was left seated in WC in room, safety pad on, table in front of her and all needs in reach. Safety plan updated and nursing notified to administer pain meds.   Mobility Bed Mobility Bed Mobility: Supine to Sit;Sit to Supine Supine to Sit: Minimal Assistance - Patient > 75% Sit to Supine: Supervision/Verbal cueing Transfers Transfers: Sit to Stand;Stand to Sit;Stand Pivot Transfers Sit to Stand: Minimal Assistance - Patient > 75% Stand to Sit: Minimal Assistance  - Patient > 75% Stand Pivot Transfers: Minimal Assistance - Patient > 75% Stand Pivot Transfer Details: Verbal cues for precautions/safety;Visual cues/gestures for precautions/safety Transfer (Assistive device): Rolling walker Locomotion  Gait Ambulation: Yes Gait Assistance: Minimal Assistance - Patient > 75% Gait Distance (Feet): 75 Feet Assistive device: Rolling walker Gait Assistance Details: Verbal cues for technique;Verbal cues for precautions/safety;Verbal cues for safe use of DME/AE Gait Assistance Details: R inattention, tactile facilitation of safe use of AD and min  verbal cues for obstacle navigation Gait Gait: Yes Gait Pattern: Impaired Gait Pattern: Wide base of support;Poor foot clearance - right;Poor foot clearance - left;Trunk flexed Gait velocity: decreased Stairs / Additional Locomotion Stairs: No Wheelchair Mobility Wheelchair Mobility: No   Discharge Criteria: Patient will be discharged from PT if patient refuses treatment 3 consecutive times without medical reason, if treatment goals not met, if there is a change in medical status, if patient makes no progress towards goals or if patient is discharged from hospital.  The above assessment, treatment plan, treatment alternatives and goals were discussed and mutually agreed upon: by patient  Cruzita Lederer Lovena Kluck, PT, DPT 01/26/2021, 4:10 PM

## 2021-01-26 NOTE — Progress Notes (Signed)
Therapist reported bloody urine Nurse observed bloody urine in BSC with small clot. MD made aware

## 2021-01-26 NOTE — Progress Notes (Addendum)
PROGRESS NOTE   Subjective/Complaints:   Feeling better- has NGT- Hard because-cannot get up and move.    ROS:  Pt denies SOB, abd pain, CP, N/V/C/D, and vision changes  Objective:   DG Abd Portable 1V  Result Date: 01/25/2021 CLINICAL DATA:  Feeding tube placement. EXAM: PORTABLE ABDOMEN - 1 VIEW COMPARISON:  01/21/2021 FINDINGS: Feeding tube tip is in the distal stomach, at the pylorus with the tip directed towards the duodenum. Contrast material visible in nondistended colon of the upper abdomen. IMPRESSION: Feeding tube tip is at the pylorus. Electronically Signed   By: Misty Stanley M.D.   On: 01/25/2021 10:39   Recent Labs    01/24/21 0450 01/25/21 1530  WBC 16.9* 15.6*  HGB 10.2* 9.7*  HCT 32.3* 31.3*  PLT 488* 480*   Recent Labs    01/24/21 0450 01/25/21 1530  NA 138  --   K 4.4  --   CL 102  --   CO2 29  --   GLUCOSE 200*  --   BUN 25*  --   CREATININE 0.74 0.64  CALCIUM 8.8*  --     Intake/Output Summary (Last 24 hours) at 01/26/2021 0905 Last data filed at 01/26/2021 0716 Gross per 24 hour  Intake 100 ml  Output 0 ml  Net 100 ml        Physical Exam: Vital Signs Blood pressure (!) 134/59, pulse 84, temperature 98.7 F (37.1 C), temperature source Oral, resp. rate 16, height 5\' 7"  (1.702 m), weight 88.1 kg, SpO2 92 %.   General: awake, alert, appropriate, NAD HENT: conjugate gaze; oropharynx moist; NGT in place CV: regular rate; no JVD Pulmonary: CTA B/L; no W/R/R- good air movement GI: soft, NT, ND, (+)BS Psychiatric: appropriate Neurological: alert Musculoskeletal:        General: Tenderness (chest wall tender) present. Normal range of motion.     Cervical back: Normal range of motion. Tr  LE edema Skin:    Comments: Midline chest incision clean and dry.  Neuro:Normal insight and awareness. Intact Memory. Normal language and speech. Strong voice, no dysarthria. Cranial nerve exam  unremarkable. UE 4/5 prox to distal. LE: 3/5 HF, 4-/5 KE, 4/5 ADF/PF. Sensory exam normal for light touch and pain in all 4 limbs. No limb ataxia or cerebellar signs. No abnormal tone appreciated.   Assessment/Plan: 1. Functional deficits which require 3+ hours per day of interdisciplinary therapy in a comprehensive inpatient rehab setting. Physiatrist is providing close team supervision and 24 hour management of active medical problems listed below. Physiatrist and rehab team continue to assess barriers to discharge/monitor patient progress toward functional and medical goals  Care Tool:  Bathing              Bathing assist       Upper Body Dressing/Undressing Upper body dressing   What is the patient wearing?: Hospital gown only    Upper body assist      Lower Body Dressing/Undressing Lower body dressing      What is the patient wearing?: Incontinence brief     Lower body assist       Toileting Toileting    Toileting assist  Transfers Chair/bed transfer  Transfers assist           Locomotion Ambulation   Ambulation assist              Walk 10 feet activity   Assist           Walk 50 feet activity   Assist           Walk 150 feet activity   Assist           Walk 10 feet on uneven surface  activity   Assist           Wheelchair     Assist               Wheelchair 50 feet with 2 turns activity    Assist            Wheelchair 150 feet activity     Assist          Blood pressure (!) 134/59, pulse 84, temperature 98.7 F (37.1 C), temperature source Oral, resp. rate 16, height 5\' 7"  (1.702 m), weight 88.1 kg, SpO2 92 %.  Medical Problem List and Plan: 1.  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  Antithrombotics: -DVT/anticoagulation:  Pharmaceutical: Lovenox 40 mg QD             -antiplatelet therapy: Plavix 75 mg daily and aspirin 81 mg QD 3. Pain Management: oxycodone IR 5/10 mg every 3 hours PRN, Neurontin 600 mg TID  12/10- pain controlled- con't regimen 4. Mood: continue Prozac, LCSW to provide emotional support             -antipsychotic agents: n/a             -nicotine patch ordered for nicotine craving 14mg  5. Neuropsych: This patient is capable of making decisions on her own behalf. 6. Skin/Wound Care: sternal wound cdi, can get wet.  7. Fluids/Electrolytes/Nutrition: routine I/Os and follow-up chemistries 8. Hypertension: BP stable, rate controlled, continue Pacerone, Norvasc, Lopressor             -bp controlled at present 9.Hyperlipidemia: continue Lipitor 10. Diabetes mellitus type 2: continue insuline regimen and CBG checks             -12/10- CBGs adequate control- con't regimen 11. COPD: continue Anoro Ellipta (prescribe at discharge) 12. Tobacco use: provide nicotine patch, smoking cessation counseling 13. PUD/GERD: continue PPI 14.  Acute blood loss anemia/leukocytosis.  Follow-up chemistries             -no signs of blood loss on exam 15.  Dysphagia.  Silent aspiration during recent MBS, needed cues to observe precautions with SLP, wet voice quality observed with honey thru cup. -Pt is NPO with snacks of purees/honey liquids by teaspoon only             -TF per cortrak  12/10- con't NPO- to see SLP today and verify diet 16. Foley             -has been in for diuresis             -recent hematuria d/t foley trauma             -  remove foley in AM tomorrow and begin voiding trial.  12/10- remove foley- and cath if volumes >350cc- ordered bladder scans.    - foley was removed before pt came over- got call from nurse at 1:53 pm- having hematuria again- will check labs in AM to make sure not large amount of blood loss- don't want to place  foley again due to recent hx of trauma FROM a foley- and see if can go without placing- if it gets worse, will call Urology.       LOS: 1 days A FACE TO FACE EVALUATION WAS PERFORMED  Hurbert Duran 01/26/2021, 9:05 AM

## 2021-01-26 NOTE — Plan of Care (Signed)
  Problem: RH Balance Goal: LTG Patient will maintain dynamic standing balance (PT) Description: LTG:  Patient will maintain dynamic standing balance with assistance during mobility activities (PT) Flowsheets (Taken 01/26/2021 1625) LTG: Pt will maintain dynamic standing balance during mobility activities with:: (LRAD) Supervision/Verbal cueing   Problem: Sit to Stand Goal: LTG:  Patient will perform sit to stand with assistance level (PT) Description: LTG:  Patient will perform sit to stand with assistance level (PT) Flowsheets (Taken 01/26/2021 1625) LTG: PT will perform sit to stand in preparation for functional mobility with assistance level: (LRAD) Supervision/Verbal cueing   Problem: RH Bed to Chair Transfers Goal: LTG Patient will perform bed/chair transfers w/assist (PT) Description: LTG: Patient will perform bed to chair transfers with assistance (PT). Flowsheets (Taken 01/26/2021 1625) LTG: Pt will perform Bed to Chair Transfers with assistance level: (LRAD) Supervision/Verbal cueing   Problem: RH Car Transfers Goal: LTG Patient will perform car transfers with assist (PT) Description: LTG: Patient will perform car transfers with assistance (PT). Flowsheets (Taken 01/26/2021 1625) LTG: Pt will perform car transfers with assist:: (LRAD) Supervision/Verbal cueing   Problem: RH Ambulation Goal: LTG Patient will ambulate in controlled environment (PT) Description: LTG: Patient will ambulate in a controlled environment, # of feet with assistance (PT). Flowsheets (Taken 01/26/2021 1625) LTG: Pt will ambulate in controlled environ  assist needed:: (LRAD) Contact Guard/Touching assist LTG: Ambulation distance in controlled environment: 150' Goal: LTG Patient will ambulate in home environment (PT) Description: LTG: Patient will ambulate in home environment, # of feet with assistance (PT). Flowsheets (Taken 01/26/2021 1625) LTG: Pt will ambulate in home environ  assist needed:: (LRAD)  Supervision/Verbal cueing LTG: Ambulation distance in home environment: 50'   Problem: RH Stairs Goal: LTG Patient will ambulate up and down stairs w/assist (PT) Description: LTG: Patient will ambulate up and down # of stairs with assistance (PT) Flowsheets (Taken 01/26/2021 1625) LTG: Pt will ambulate up/down stairs assist needed:: Contact Guard/Touching assist LTG: Pt will  ambulate up and down number of stairs: 4   Problem: RH Attention Goal: LTG Patient will demonstrate this level of attention during functional activites (PT) Description: LTG:  Patient will demonstrate this level of attention during functional activites (PT) Flowsheets (Taken 01/26/2021 1625) Patient will demonstrate this level of attention during functional activites: Selective Patient will demonstrate above attention level in the following environment:  Controlled  Home LTG: Patient will demonstrate attention during functional mobility with assistance of: Minimal Assistance - Patient > 75%

## 2021-01-26 NOTE — Evaluation (Signed)
Occupational Therapy Assessment and Plan  Patient Details  Name: Morgan Daniels MRN: [id number] Date of Birth: 01/31/1961  OT Diagnosis: cognitive deficits and muscle weakness (generalized) Rehab Potential: Rehab Potential (ACUTE ONLY): Good ELOS: 7-10 days   Today's Date: 01/26/2021 OT Individual Time: 0800-0900 OT Individual Time Calculation (min): 60 min     Hospital Problem: Principal Problem:   Debility   Past Medical History:  Past Medical History:  Diagnosis Date   Anxiety    Arthritis    Bilateral carotid artery stenosis 2014   Carotid artery occlusion    Chronic kidney disease May 2017   UTI   Chronic kidney disease 2017   Current smoker    CVA (cerebral vascular accident) (HCC) 2013   Depression    Diabetes (HCC)    Diverticulosis    Fatty liver    Fibromyalgia    GERD (gastroesophageal reflux disease)    H/O hiatal hernia    Hiatal hernia    Hypercholesteremia    Hypertension    IBS (irritable bowel syndrome)    PAD (peripheral artery disease) (HCC)    Peptic ulcer    Plantar fasciitis    Stroke (HCC) Dec. 14,2013   Right side-ministroke   T2DM (type 2 diabetes mellitus) (HCC)    Past Surgical History:  Past Surgical History:  Procedure Laterality Date   ABDOMINAL HYSTERECTOMY  1990   BACK SURGERY  2000, 2004   CAROTID ENDARTERECTOMY Left 05/06/12   CARPAL TUNNEL RELEASE Left 07/18/2015   Procedure: CARPAL TUNNEL RELEASE;  Surgeon: Howard Miller, MD;  Location: [facility];  Service: Orthopedics;  Laterality: Left;   CEREBRAL ANGIOGRAM Bilateral 05/03/2012   Procedure: CEREBRAL ANGIOGRAM;  Surgeon: Christopher S Dickson, MD;  Location: [facility];  Service: Cardiovascular;  Laterality: Bilateral;   CHOLECYSTECTOMY  2001   COLONOSCOPY WITH PROPOFOL N/A 11/19/2018   Procedure: COLONOSCOPY WITH PROPOFOL;  Surgeon: Wohl, Darren, MD;  Location: [facility];  Service: Endoscopy;  Laterality: N/A;  Diabetic - insulin   CORONARY ARTERY BYPASS GRAFT N/A  01/18/2021   Procedure: CORONARY ARTERY BYPASS GRAFTING (CABG) x 4  USING LEFT INTERNAL MAMMARY ARTERY AND LEFT ENDOSCOPIC GREATER SAPHENOUS VEIN CONDUITS;  Surgeon: Hendrickson, Steven C, MD;  Location: [facility];  Service: Open Heart Surgery;  Laterality: N/A;   CORONARY/GRAFT ACUTE MI REVASCULARIZATION N/A 01/13/2021   Procedure: Coronary/Graft Acute MI Revascularization;  Surgeon: Paraschos, Alexander, MD;  Location: [facility];  Service: Cardiovascular;  Laterality: N/A;   ENDARTERECTOMY Left 05/06/2012   Procedure: ENDARTERECTOMY CAROTID;  Surgeon: Christopher S Dickson, MD;  Location: [facility];  Service: Vascular;  Laterality: Left;   ENDARTERECTOMY Right 08/09/2013   Procedure: ENDARTERECTOMY CAROTID-RIGHT;  Surgeon: Christopher S Dickson, MD;  Location: [facility];  Service: Vascular;  Laterality: Right;   ENDOVEIN HARVEST OF GREATER SAPHENOUS VEIN Left 01/18/2021   Procedure: ENDOVEIN HARVEST OF GREATER SAPHENOUS VEIN;  Surgeon: Hendrickson, Steven C, MD;  Location: [facility];  Service: Open Heart Surgery;  Laterality: Left;   HERNIA REPAIR     IABP INSERTION Right 01/13/2021   Procedure: IABP Insertion;  Surgeon: Paraschos, Alexander, MD;  Location: [facility];  Service: Cardiovascular;  Laterality: Right;   LEFT HEART CATH AND CORONARY ANGIOGRAPHY N/A 01/13/2021   Procedure: LEFT HEART CATH AND CORONARY ANGIOGRAPHY;  Surgeon: Paraschos, Alexander, MD;  Location: [facility];  Service: Cardiovascular;  Laterality: N/A;   LOWER EXTREMITY ANGIOGRAPHY Left 01/03/2021   Procedure: LOWER EXTREMITY ANGIOGRAPHY;    Surgeon: Algernon Huxley, MD;  Location: [place] [facility];  Service: Cardiovascular;  Laterality: Left;   PATCH ANGIOPLASTY Left 05/06/2012   Procedure: WITH DACRON PATCH ANGIOPLASTY ;  Surgeon: Angelia Mould, MD;  Location: [place];  Service: Vascular;  Laterality: Left;   POLYPECTOMY  11/19/2018   Procedure: POLYPECTOMY;  Surgeon: Lucilla Lame, MD;  Location: [place];  Service: Endoscopy;;   SPINE SURGERY  2004   TEE WITHOUT CARDIOVERSION N/A 01/18/2021   Procedure: TRANSESOPHAGEAL ECHOCARDIOGRAM (TEE);  Surgeon: [name], MD;  Location: [place];  Service: Open Heart Surgery;  Laterality: N/A;   TONSILLECTOMY     TUBAL LIGATION      Assessment & Plan Clinical Impression:  Morgan Daniels is a 60 year old female who presented to [facility] emergency department on January 12, 2021 via EMS for management of acute respiratory failure and unresponsiveness.  She was admitted to critical care medicine and required intubation and mechanical ventilation.  Further work-up revealed ST depressions in V leads on EKG and serial troponins at approximately 3000.  Heparin infusion for non-ST elevation MI was initiated.  She was able to be weaned and extubated but developed respiratory distress again and was reintubated.   Cardiology was consulted and echocardiography performed revealed an estimated ejection fraction of 60 to 65%.  They assessed her as likely having flash pulmonary edema related to her acute MI.  She was taken to the cardiac catheterization lab and underwent left heart catheterization demonstrating severe three-vessel coronary artery disease and mildly reduced left ventricular function with estimated LV ejection fraction of 40%.  Intra-aortic balloon pump was placed.  She was then transferred to [facility] for further evaluation and management of her three-vessel coronary artery disease.  Critical care medicine was consulted.     Cardiothoracic surgery/Dr. Roxan Hockey was consulted.  Urgent coronary artery bypass graft surgery was recommended.  She remained intubated, mechanically ventilated and sedated.  Her illness was complicated by hypoxic respiratory failure>> multifactorial.  She developed multifocal pneumonia with possible aspiration.  Antibiotic course was completed.  She underwent three-vessel  coronary artery bypass grafting on January 18, 2021 by Dr. Roxan Hockey.   Her IABP was removed on POD 1. She self-extubated on POD 2.  She underwent SLP evaluation on December 22, 2020 with findings of moderate aspiration risk, moderate pharyngeal dysphagia.  She was made NPO.  Cortrak was placed and she was receiving Prosource twice daily.  The patient had a fall without acute injury on January 23, 2021 and Cortrak was inadvertently dislodged.  Follow-up speech therapy swallowing evaluation she was permitted to have small snacks of pures and honey thick liquid awaiting plan to replace nasogastric tube.. She is maintained on Plavix, aspirin and statin.  She is receiving Lovenox 40 mg daily for DVT prophylaxis. She will need inpatient physical medicine and rehabilitation services for functional deficits secondary to recent critical illness, open heart surgery.   Significant past medical history includes tobacco use, COPD, diabetes mellitus, hypertension, hyperlipidemia, prior stroke, gastroesophageal reflux disease and hiatal hernia. No history of renal disease.   Prior to admission, the patient lived independently at home with her husband.  Patient transferred to CIR on 01/25/2021 .    Patient currently requires min with basic self-care skills secondary to muscle weakness, decreased cardiorespiratoy endurance and decreased oxygen support, delayed processing, and decreased standing balance and decreased balance strategies.  Prior to hospitalization, patient was fully independent.  Patient will benefit from  skilled intervention to increase independence with basic self-care skills prior to discharge home with care partner.  Anticipate patient will require intermittent supervision and follow up home health.  OT - End of Session Activity Tolerance: Tolerates < 10 min activity with changes in vital signs Endurance Deficit: Yes Endurance Deficit Description: on 6L of O2, decreases from 98%to 88-90% with  activity OT Assessment Rehab Potential (ACUTE ONLY): Good OT Patient demonstrates impairments in the following area(s): Balance;Cognition;Endurance;Motor OT Basic ADL's Functional Problem(s): Bathing;Dressing;Toileting OT Transfers Functional Problem(s): Toilet;Tub/Shower OT Additional Impairment(s): None OT Plan OT Intensity: Minimum of 1-2 x/day, 45 to 90 minutes OT Frequency: 5 out of 7 days OT Duration/Estimated Length of Stay: 7-10 days OT Treatment/Interventions: Balance/vestibular training;Cognitive remediation/compensation;Discharge planning;DME/adaptive equipment instruction;Functional mobility training;Patient/family education;Psychosocial support;Self Care/advanced ADL retraining;Therapeutic Activities;Therapeutic Exercise OT Self Feeding Anticipated Outcome(s): when pt is safe to eat, self feeding goal will be set later at independent OT Basic Self-Care Anticipated Outcome(s): supervision OT Toileting Anticipated Outcome(s): supervision OT Bathroom Transfers Anticipated Outcome(s): supervision OT Recommendation Patient destination: Home Follow Up Recommendations: Home health OT Equipment Recommended: 3 in 1 bedside comode   OT Evaluation Precautions/Restrictions  Precautions Precautions: Fall;Sternal;Other (comment) Precaution Comments: monitor O2 (does not wear at baseline) Restrictions Weight Bearing Restrictions: Yes RLE Weight Bearing: Weight bearing as tolerated Other Position/Activity Restrictions: sternal precautions    Vital Signs Oxygen Therapy SpO2: 92 % O2 Device: Nasal Cannula O2 Flow Rate (L/min): 4.5 L/min Pain Pain Assessment Pain Scale: 0-10 Pain Score: 0-No pain Home Living/Prior Functioning Home Living Family/patient expects to be discharged to:: Private residence Living Arrangements: Spouse/significant other Available Help at Discharge: Family (Simultaneous filing. User may not have seen previous data.) Type of Home: House (Simultaneous  filing. User may not have seen previous data.) Home Access: Stairs to enter Entrance Stairs-Number of Steps: 3-4 Entrance Stairs-Rails: Right Home Layout: One level Bathroom Shower/Tub: Multimedia programmer: Handicapped height Bathroom Accessibility: Yes  Lives With: Spouse (Simultaneous filing. User may not have seen previous data.) Prior Function Level of Independence: Independent with basic ADLs, Independent with gait, Independent with transfers  Able to Take Stairs?: Yes Driving: Yes Vocation: On disability Vision Baseline Vision/History: 1 Wears glasses Ability to See in Adequate Light: 0 Adequate Patient Visual Report: No change from baseline Vision Assessment?: No apparent visual deficits Perception  Perception: Within Functional Limits Praxis Praxis: Intact Cognition Overall Cognitive Status: Impaired/Different from baseline Arousal/Alertness: Awake/alert Orientation Level: Person;Place;Situation Person: Oriented Place: Oriented Situation: Oriented Year: 2022 Month: December Day of Week: Correct Memory: Appears intact Immediate Memory Recall: Sock;Blue;Bed Memory Recall Sock: Without Cue Memory Recall Blue: Without Cue Memory Recall Bed: Without Cue Attention: Selective Selective Attention: Impaired Selective Attention Impairment: Verbal complex Awareness: Impaired Awareness Impairment: Emergent impairment Problem Solving: Impaired Problem Solving Impairment: Functional complex Safety/Judgment: Impaired Comments: slow processing Sensation Sensation Light Touch: Appears Intact Hot/Cold: Appears Intact Proprioception: Appears Intact Stereognosis: Appears Intact Coordination Gross Motor Movements are Fluid and Coordinated: No Fine Motor Movements are Fluid and Coordinated: Yes Coordination and Movement Description: limited by sternal prec in UEs , LEs WFL Motor  Motor Motor - Skilled Clinical Observations: generalized weakness  Trunk/Postural  Assessment  Cervical Assessment Cervical Assessment: Within Functional Limits Thoracic Assessment Thoracic Assessment: Within Functional Limits Lumbar Assessment Lumbar Assessment: Exceptions to WFL (kyphotic posture) Postural Control Postural Control: Within Functional Limits  Balance Static Sitting Balance Static Sitting - Level of Assistance: 5: Stand by assistance Dynamic Sitting Balance Dynamic Sitting - Level of Assistance: 4: Min assist  Static Standing Balance Static Standing - Level of Assistance: 5: Stand by assistance Dynamic Standing Balance Dynamic Standing - Level of Assistance: 4: Min assist;3: Mod assist Extremity/Trunk Assessment RUE Assessment RUE Assessment: Within Functional Limits General Strength Comments: NT due to sternal precautions, but distal strength WFL LUE Assessment LUE Assessment: Within Functional Limits General Strength Comments: NT due to sternal precautions, but distal strength WFL  Care Tool Care Tool Self Care Eating Eating activity did not occur: Safety/medical concerns (cortrak) Eating Assist Level: Set up assist (during trials with SLP)    Oral Care    Oral Care Assist Level: Total assistance - Patient < 25%    Bathing   Body parts bathed by patient: Chest;Abdomen;Left arm;Right arm;Right lower leg;Left lower leg;Face;Right upper leg;Left upper leg;Front perineal area Body parts bathed by helper: Buttocks   Assist Level: Minimal Assistance - Patient > 75%    Upper Body Dressing(including orthotics)   What is the patient wearing?: Pull over shirt   Assist Level: Supervision/Verbal cueing    Lower Body Dressing (excluding footwear)   What is the patient wearing?: Incontinence brief;Pants Assist for lower body dressing: Minimal Assistance - Patient > 75%    Putting on/Taking off footwear   What is the patient wearing?: Ted hose;Non-skid slipper socks Assist for footwear: Moderate Assistance - Patient 50 - 74%       Care Tool  Toileting Toileting activity   Assist for toileting: Moderate Assistance - Patient 50 - 74%     Care Tool Bed Mobility Roll left and right activity   Roll left and right assist level: Supervision/Verbal cueing    Sit to lying activity        Lying to sitting on side of bed activity   Lying to sitting on side of bed assist level: the ability to move from lying on the back to sitting on the side of the bed with no back support.: Minimal Assistance - Patient > 75%     Care Tool Transfers Sit to stand transfer   Sit to stand assist level: Minimal Assistance - Patient > 75%    Chair/bed transfer   Chair/bed transfer assist level: Minimal Assistance - Patient > 75%     Toilet transfer   Assist Level: Minimal Assistance - Patient > 75%     Care Tool Cognition  Expression of Ideas and Wants Expression of Ideas and Wants: 4. Without difficulty (complex and basic) - expresses complex messages without difficulty and with speech that is clear and easy to understand  Understanding Verbal and Non-Verbal Content Understanding Verbal and Non-Verbal Content: 4. Understands (complex and basic) - clear comprehension without cues or repetitions   Memory/Recall Ability Memory/Recall Ability : That he or she is in a hospital/hospital unit;Location of own room;Current season;Staff names and faces   Refer to Care Plan for Long Term Goals  SHORT TERM GOAL WEEK 1 OT Short Term Goal 1 (Week 1): STGs = LTGs  Recommendations for other services: None    Skilled Therapeutic Intervention ADL ADL Eating: NPO Grooming: Minimal assistance Upper Body Bathing: Supervision/safety Lower Body Bathing: Minimal assistance Upper Body Dressing: Supervision/safety Lower Body Dressing: Minimal assistance Toileting: Moderate assistance Where Assessed-Toileting: Bedside Commode Toilet Transfer: Minimal assistance Toilet Transfer Method: Stand pivot Mobility  Transfers Sit to Stand: Minimal Assistance -  Patient > 75% Stand to Sit: Minimal Assistance - Patient > 75%  Pt seen for initial evaluation and ADL training with a focus on pt adhering to sternal precautions especially   with moving supine to sit, sit to stand, and reaching behind to cleanse bottom and manage pants.  Pt participated well and did well following directions. I occasionally noticed some mild problem solving difficulties with managing tubing with clothing management.  Mobility limited due to overall strength but also logistical management of coretrak tubing and O2 tubing. Pt only needs min A so she should progress to S fairly quickly.   Discussed role of OT, pt's goals, ELOS, POC.  Pt resting in recliner with all needs met. Chair pad alarm on.   Discharge Criteria: Patient will be discharged from OT if patient refuses treatment 3 consecutive times without medical reason, if treatment goals not met, if there is a change in medical status, if patient makes no progress towards goals or if patient is discharged from hospital.  The above assessment, treatment plan, treatment alternatives and goals were discussed and mutually agreed upon: by patient  [facility] 01/26/2021, 12:34 PM

## 2021-01-26 NOTE — Evaluation (Signed)
Speech Language Pathology Assessment and Plan  Patient Details  Name: Morgan Daniels MRN: [id number] Date of Birth: November 04, 1960  SLP Diagnosis: Dysphagia;Cognitive Impairments  Rehab Potential: Good ELOS: 7-10 days    Today's Date: 01/26/2021 SLP Individual Time: 0900-1000 SLP Individual Time Calculation (min): 60 min   Hospital Problem: Principal Problem:   Debility  Past Medical History:  Past Medical History:  Diagnosis Date   Anxiety    Arthritis    Bilateral carotid artery stenosis 2014   Carotid artery occlusion    Chronic kidney disease May 2017   UTI   Chronic kidney disease 2017   Current smoker    CVA (cerebral vascular accident) ([place]) 2013   Depression    Diabetes ([place])    Diverticulosis    Fatty liver    Fibromyalgia    GERD (gastroesophageal reflux disease)    H/O hiatal hernia    Hiatal hernia    Hypercholesteremia    Hypertension    IBS (irritable bowel syndrome)    PAD (peripheral artery disease) ([place])    Peptic ulcer    Plantar fasciitis    Stroke [facility]) Dec. 14,2013   Right side-ministroke   T2DM (type 2 diabetes mellitus) ([place])    Past Surgical History:  Past Surgical History:  Procedure Laterality Date   ABDOMINAL HYSTERECTOMY  1990   BACK SURGERY  2000, 2004   CAROTID ENDARTERECTOMY Left 05/06/12   CARPAL TUNNEL RELEASE Left 07/18/2015   Procedure: CARPAL TUNNEL RELEASE;  Surgeon: Earnestine Leys, MD;  Location: [facility] ORS;  Service: Orthopedics;  Laterality: Left;   CEREBRAL ANGIOGRAM Bilateral 05/03/2012   Procedure: CEREBRAL ANGIOGRAM;  Surgeon: Angelia Mould, MD;  Location: [facility] CATH LAB;  Service: Cardiovascular;  Laterality: Bilateral;   CHOLECYSTECTOMY  2001   COLONOSCOPY WITH PROPOFOL N/A 11/19/2018   Procedure: COLONOSCOPY WITH PROPOFOL;  Surgeon: Lucilla Lame, MD;  Location: [place];  Service: Endoscopy;  Laterality: N/A;  Diabetic - insulin   CORONARY ARTERY BYPASS GRAFT N/A 01/18/2021   Procedure: CORONARY ARTERY  BYPASS GRAFTING (CABG) x 4  USING LEFT INTERNAL MAMMARY ARTERY AND LEFT ENDOSCOPIC GREATER SAPHENOUS VEIN CONDUITS;  Surgeon: [name], MD;  Location: [place];  Service: Open Heart Surgery;  Laterality: N/A;   CORONARY/GRAFT ACUTE MI REVASCULARIZATION N/A 01/13/2021   Procedure: Coronary/Graft Acute MI Revascularization;  Surgeon: Isaias Cowman, MD;  Location: [place] CV LAB;  Service: Cardiovascular;  Laterality: N/A;   ENDARTERECTOMY Left 05/06/2012   Procedure: ENDARTERECTOMY CAROTID;  Surgeon: Angelia Mould, MD;  Location: [place];  Service: Vascular;  Laterality: Left;   ENDARTERECTOMY Right 08/09/2013   Procedure: ENDARTERECTOMY CAROTID-RIGHT;  Surgeon: Angelia Mould, MD;  Location: [place];  Service: Vascular;  Laterality: Right;   ENDOVEIN HARVEST OF GREATER SAPHENOUS VEIN Left 01/18/2021   Procedure: ENDOVEIN HARVEST OF GREATER SAPHENOUS VEIN;  Surgeon: [name], MD;  Location: [place];  Service: Open Heart Surgery;  Laterality: Left;   HERNIA REPAIR     IABP INSERTION Right 01/13/2021   Procedure: IABP Insertion;  Surgeon: Isaias Cowman, MD;  Location: [place] CV LAB;  Service: Cardiovascular;  Laterality: Right;   LEFT HEART CATH AND CORONARY ANGIOGRAPHY N/A 01/13/2021   Procedure: LEFT HEART CATH AND CORONARY ANGIOGRAPHY;  Surgeon: Isaias Cowman, MD;  Location: [place] CV LAB;  Service: Cardiovascular;  Laterality: N/A;   LOWER EXTREMITY ANGIOGRAPHY Left 01/03/2021   Procedure: LOWER EXTREMITY ANGIOGRAPHY;  Surgeon: Algernon Huxley, MD;  Location: [facility]  INVASIVE CV LAB;  Service: Cardiovascular;  Laterality: Left;   PATCH ANGIOPLASTY Left 05/06/2012   Procedure: WITH DACRON PATCH ANGIOPLASTY ;  Surgeon: Angelia Mould, MD;  Location: [place];  Service: Vascular;  Laterality: Left;   POLYPECTOMY  11/19/2018   Procedure: POLYPECTOMY;  Surgeon: Lucilla Lame, MD;  Location: [place];  Service: Endoscopy;;    SPINE SURGERY  2004   TEE WITHOUT CARDIOVERSION N/A 01/18/2021   Procedure: TRANSESOPHAGEAL ECHOCARDIOGRAM (TEE);  Surgeon: [name], MD;  Location: [place];  Service: Open Heart Surgery;  Laterality: N/A;   TONSILLECTOMY     TUBAL LIGATION      Assessment / Plan / Recommendation Clinical Impression  Morgan Daniels is a 60 year old female who presented to [facility] emergency department on January 12, 2021 via EMS for management of acute respiratory failure and unresponsiveness.  She was admitted to critical care medicine and required intubation and mechanical ventilation.  Further work-up revealed ST depressions in V leads on EKG and serial troponins at approximately 3000.  Heparin infusion for non-ST elevation MI was initiated.  She was able to be weaned and extubated but developed respiratory distress again and was reintubated.   Cardiology was consulted and echocardiography performed revealed an estimated ejection fraction of 60 to 65%.  They assessed her as likely having flash pulmonary edema related to her acute MI.  She was taken to the cardiac catheterization lab and underwent left heart catheterization demonstrating severe three-vessel coronary artery disease and mildly reduced left ventricular function with estimated LV ejection fraction of 40%.  Intra-aortic balloon pump was placed.  She was then [facility] for further evaluation and management of her three-vessel coronary artery disease.  Critical care medicine was consulted.     Cardiothoracic surgery/Dr. Roxan Hockey was consulted.  Urgent coronary artery bypass graft surgery was recommended.  She remained intubated, mechanically ventilated and sedated.  Her illness was complicated by hypoxic respiratory failure>> multifactorial.  She developed multifocal pneumonia with possible aspiration.  Antibiotic course was completed.  She underwent three-vessel coronary artery bypass  grafting on January 18, 2021 by Dr. Roxan Hockey.   Her IABP was removed on POD 1. She self-extubated on POD 2.  She underwent SLP evaluation on December 22, 2020 with findings of moderate aspiration risk, moderate pharyngeal dysphagia.  She was made NPO.  Cortrak was placed and she was receiving Prosource twice daily.  The patient had a fall without acute injury on January 23, 2021 and Cortrak was inadvertently dislodged.  Follow-up speech therapy swallowing evaluation she was permitted to have small snacks of pures and honey thick liquid awaiting plan to replace nasogastric tube.. She is maintained on Plavix, aspirin and statin.  She is receiving Lovenox 40 mg daily for DVT prophylaxis. She will need inpatient physical medicine and rehabilitation services for functional deficits secondary to recent critical illness, open heart surgery.   Significant past medical history includes tobacco use, COPD, diabetes mellitus, hypertension, hyperlipidemia, prior stroke, gastroesophageal reflux disease and hiatal hernia. No history of renal disease. Pt admitted to CIR and SLP evaluation completed on 01/26/2021 with results as follows:  Pt presents with s/s of a post extubation dysphagia that are consistent with most recent MBS (12/6) findings.  Pt exhibited no overt s/s of aspiration with small amounts of ice chips, honey thick liquids via teaspoon, or pureed textures but had immediate coughing with thin liquids via teaspoon and cup, and dys 2 trials.  Pt also  reports sensation of pharyngeal residue leading to increased difficulty swallowing and subsequent coughing on thicker viscosity boluses.  Pt is very motivated to resume eating and drinking which at times lead to a fast rate of intake which exacerbated coughing.  As a result, I recommend that pt remain NPO with functional snacks of honey thick liquids via teaspoon and purees with SLP only.  Pt will need repeat MBS prior to initiation of PO diet given the extent of her  deficits noted on last instrumental evaluation.   Pt endorses mild cognitive changes post surgery and demonstrated decreased selective attention, decreased emergent awareness and safety awareness with PO trials, and decreased use of sternal precautions during transfer.   Given the abovementioned deficits, pt would benefit from skilled ST while inpatient in order to maximize functional independence and reduce burden of care prior to discharge.  Anticipate that pt will need intermittent supervision at discharge in addition to ST follow up at next level of care.    Cognitive-linguistic evaluation completed with results and recommendations reviewed with family.    Skilled Therapeutic Interventions          Cognitive-linguistic and bedside swallow evaluation completed with results and recommendations reviewed with patient.    SLP Assessment  Patient will need skilled Speech Lanaguage Pathology Services during CIR admission    Recommendations  SLP Diet Recommendations: NPO Medication Administration: Via alternative means Oral Care Recommendations: Oral care QID Patient destination: Home Follow up Recommendations: Home Health SLP Equipment Recommended: To be determined    SLP Frequency 3 to 5 out of 7 days   SLP Duration  SLP Intensity  SLP Treatment/Interventions 7-10 days  Minumum of 1-2 x/day, 30 to 90 minutes  Cognitive remediation/compensation;Cueing hierarchy;Dysphagia/aspiration precaution training;Patient/family education;Internal/external aids    Pain Pain Assessment Pain Scale: 0-10 Pain Score: 0-No pain  Prior Functioning Cognitive/Linguistic Baseline: Baseline deficits Baseline deficit details: pt states a loss of interest in doing things over the past year due to decreased endurance secondary to cardiac issues Type of Home: House (Simultaneous filing. User may not have seen previous data.)  Lives With: Spouse (Simultaneous filing. User may not have seen previous  data.) Available Help at Discharge: Family (Simultaneous filing. User may not have seen previous data.) Vocation: On disability  SLP Evaluation Cognition Overall Cognitive Status: Impaired/Different from baseline Arousal/Alertness: Awake/alert Orientation Level: Oriented X4 Year: 2022 Month: December Day of Week: Correct Attention: Selective Selective Attention: Impaired Selective Attention Impairment: Verbal complex Memory: Appears intact Immediate Memory Recall: Sock;Blue;Bed Memory Recall Sock: Without Cue Memory Recall Blue: Without Cue Memory Recall Bed: Without Cue Awareness: Impaired Awareness Impairment: Emergent impairment Problem Solving: Impaired Problem Solving Impairment: Functional complex Safety/Judgment: Impaired Comments: slow processing  Comprehension Auditory Comprehension Overall Auditory Comprehension: Appears within functional limits for tasks assessed Expression Expression Primary Mode of Expression: Verbal Verbal Expression Overall Verbal Expression: Appears within functional limits for tasks assessed Written Expression Dominant Hand: Right Oral Motor Oral Motor/Sensory Function Overall Oral Motor/Sensory Function: Generalized oral weakness Motor Speech Overall Motor Speech: Appears within functional limits for tasks assessed  Care Tool Care Tool Cognition Ability to hear (with hearing aid or hearing appliances if normally used Ability to hear (with hearing aid or hearing appliances if normally used): 0. Adequate - no difficulty in normal conservation, social interaction, listening to TV   Expression of Ideas and Wants Expression of Ideas and Wants: 4. Without difficulty (complex and basic) - expresses complex messages without difficulty and with speech that is clear and  easy to understand   Understanding Verbal and Non-Verbal Content Understanding Verbal and Non-Verbal Content: 4. Understands (complex and basic) - clear comprehension without  cues or repetitions  Memory/Recall Ability Memory/Recall Ability : That he or she is in a hospital/hospital unit;Location of own room;Current season;Staff names and faces     Bedside Swallowing Assessment General Previous Swallow Assessment: MBS 12/6 Diet Prior to this Study: NPO;NG Tube Temperature Spikes Noted: No Respiratory Status: Supplemental O2 delivered via (comment) (5L nasal cannula) History of Recent Intubation: Yes Length of Intubations (days): 9 days Date extubated: 01/20/21 Behavior/Cognition: Alert;Cooperative Oral Cavity - Dentition: Adequate natural dentition Self-Feeding Abilities: Able to feed self Vision: Functional for self-feeding Patient Positioning: Upright in bed Baseline Vocal Quality: Normal Volitional Cough: Weak Volitional Swallow: Able to elicit  Oral Care Assessment Does patient have any of the following "high(er) risk" factors?: Diet - patient on tube feedings Does patient have any of the following "at risk" factors?: Nutritional status - inadequate Patient is HIGHER RISK:  Mechanically ventilated (Intubated or Tracheostomy): Order set for Adult Oral Care Protocol initiated - "Higher Risk Patients:  Mechanically Ventilated" option selected (see row information) Patient is HIGH RISK: Non-ventilated: Order set for Adult Oral Care Protocol initiated - "High Risk Patients - Non-Ventilated" option selected  (see row information) Patient is AT RISK: Order set for Adult Oral Care Protocol initiated -  "At Risk Patients" option selected (see row information) Ice Chips Ice chips: Within functional limits Thin Liquid Thin Liquid: Impaired Presentation: Cup;Spoon;Self Fed Pharyngeal  Phase Impairments: Cough - Immediate Nectar Thick   Honey Thick Honey Thick Liquid: Impaired Presentation: Cup;Spoon Pharyngeal Phase Impairments: Cough - Immediate (with cup sips, no s/s with teaspoons) Puree Puree: Within functional limits Presentation:  Spoon Solid Solid: Impaired (graham crackers in puree) Presentation: Self Fed;Spoon Pharyngeal Phase Impairments: Cough - Immediate BSE Assessment Risk for Aspiration Impact on safety and function: Moderate aspiration risk Other Related Risk Factors: Previous CVA;History of GERD;Deconditioning;Decreased respiratory status;Prolonged intubation  Short Term Goals: Week 1: SLP Short Term Goal 1 (Week 1): STG=LTG due to ELOS  Refer to Care Plan for Long Term Goals  Recommendations for other services: None   Discharge Criteria: Patient will be discharged from SLP if patient refuses treatment 3 consecutive times without medical reason, if treatment goals not met, if there is a change in medical status, if patient makes no progress towards goals or if patient is discharged from hospital.  The above assessment, treatment plan, treatment alternatives and goals were discussed and mutually agreed upon: by patient  Emilio Math 01/26/2021, 12:35 PM

## 2021-01-26 NOTE — Progress Notes (Signed)
Family brought outside milkshake, coffee, and peanut butter for pt. Nurse updated by SLP to maintain NPO status d/t high aspiration risk. Family and pt educated on continued NPO status. Pt and family verbalized understanding.

## 2021-01-27 DIAGNOSIS — R5381 Other malaise: Secondary | ICD-10-CM | POA: Diagnosis not present

## 2021-01-27 LAB — CBC WITH DIFFERENTIAL/PLATELET
Abs Immature Granulocytes: 0.08 10*3/uL — ABNORMAL HIGH (ref 0.00–0.07)
Basophils Absolute: 0 10*3/uL (ref 0.0–0.1)
Basophils Relative: 0 %
Eosinophils Absolute: 1 10*3/uL — ABNORMAL HIGH (ref 0.0–0.5)
Eosinophils Relative: 8 %
HCT: 29.6 % — ABNORMAL LOW (ref 36.0–46.0)
Hemoglobin: 9.2 g/dL — ABNORMAL LOW (ref 12.0–15.0)
Immature Granulocytes: 1 %
Lymphocytes Relative: 21 %
Lymphs Abs: 2.5 10*3/uL (ref 0.7–4.0)
MCH: 27.2 pg (ref 26.0–34.0)
MCHC: 31.1 g/dL (ref 30.0–36.0)
MCV: 87.6 fL (ref 80.0–100.0)
Monocytes Absolute: 0.8 10*3/uL (ref 0.1–1.0)
Monocytes Relative: 7 %
Neutro Abs: 7.5 10*3/uL (ref 1.7–7.7)
Neutrophils Relative %: 63 %
Platelets: 423 10*3/uL — ABNORMAL HIGH (ref 150–400)
RBC: 3.38 MIL/uL — ABNORMAL LOW (ref 3.87–5.11)
RDW: 14.4 % (ref 11.5–15.5)
WBC: 11.9 10*3/uL — ABNORMAL HIGH (ref 4.0–10.5)
nRBC: 0 % (ref 0.0–0.2)

## 2021-01-27 LAB — GLUCOSE, CAPILLARY
Glucose-Capillary: 158 mg/dL — ABNORMAL HIGH (ref 70–99)
Glucose-Capillary: 159 mg/dL — ABNORMAL HIGH (ref 70–99)
Glucose-Capillary: 174 mg/dL — ABNORMAL HIGH (ref 70–99)
Glucose-Capillary: 188 mg/dL — ABNORMAL HIGH (ref 70–99)
Glucose-Capillary: 198 mg/dL — ABNORMAL HIGH (ref 70–99)

## 2021-01-27 LAB — BASIC METABOLIC PANEL
Anion gap: 6 (ref 5–15)
BUN: 11 mg/dL (ref 6–20)
CO2: 35 mmol/L — ABNORMAL HIGH (ref 22–32)
Calcium: 8.6 mg/dL — ABNORMAL LOW (ref 8.9–10.3)
Chloride: 99 mmol/L (ref 98–111)
Creatinine, Ser: 0.51 mg/dL (ref 0.44–1.00)
GFR, Estimated: >60 mL/min (ref >60)
Glucose, Bld: 210 mg/dL — ABNORMAL HIGH (ref 70–99)
Potassium: 3.9 mmol/L (ref 3.5–5.1)
Sodium: 140 mmol/L (ref 135–145)

## 2021-01-27 MED ORDER — ONDANSETRON HCL 4 MG PO TABS
4.0000 mg | ORAL_TABLET | Freq: Three times a day (TID) | ORAL | Status: DC | PRN
  Administered 2021-01-28 – 2021-01-29 (×2): 4 mg
  Filled 2021-01-27 (×4): qty 1

## 2021-01-27 NOTE — Progress Notes (Signed)
Wound care to abdominal puncture wound. Old dressing CDI, wound bed pink, red, dull. Periwound CDI. Wound cleansed with NS, dried with gauze, bacitracin applied to wound bed, covered with 2x2, secured with tape. Pt tolerated well.

## 2021-01-27 NOTE — Progress Notes (Signed)
Nurse reinforced that pt is not to leave unit or grounds and does not have a grounds pass. Pt and family verbalized understanding

## 2021-01-27 NOTE — Progress Notes (Signed)
Pt now on 3L [place], sats at 94%. Will continue to monitor

## 2021-01-27 NOTE — Progress Notes (Signed)
Nurse began weaning pt of O2. Reduced to 4 LPM, sats remain above 92

## 2021-01-27 NOTE — Progress Notes (Signed)
PROGRESS NOTE   Subjective/Complaints:   Bloody urine and passing clots- doesn't have foley- is still passing clots from yesterday -hematuria.  Still on O2- will try to wean.  WBC down from 15.6 to 11.9- Insisting she go home on "day pass"- explained cannot do this while on TF's.  ROS:  Pt denies SOB, abd pain, CP, N/V/C/D, and vision changes   Objective:   DG Abd Portable 1V  Result Date: 01/25/2021 CLINICAL DATA:  Feeding tube placement. EXAM: PORTABLE ABDOMEN - 1 VIEW COMPARISON:  01/21/2021 FINDINGS: Feeding tube tip is in the distal stomach, at the pylorus with the tip directed towards the duodenum. Contrast material visible in nondistended colon of the upper abdomen. IMPRESSION: Feeding tube tip is at the pylorus. Electronically Signed   By: Misty Stanley M.D.   On: 01/25/2021 10:39   Recent Labs    01/25/21 1530 01/27/21 0328  WBC 15.6* 11.9*  HGB 9.7* 9.2*  HCT 31.3* 29.6*  PLT 480* 423*   Recent Labs    01/25/21 1530 01/27/21 0328  NA  --  140  K  --  3.9  CL  --  99  CO2  --  35*  GLUCOSE  --  210*  BUN  --  11  CREATININE 0.64 0.51  CALCIUM  --  8.6*    Intake/Output Summary (Last 24 hours) at 01/27/2021 0943 Last data filed at 01/26/2021 2008 Gross per 24 hour  Intake 0 ml  Output 0 ml  Net 0 ml        Physical Exam: Vital Signs Blood pressure 130/70, pulse 88, temperature 97.8 F (36.6 C), resp. rate 18, height 5\' 7"  (1.702 m), weight 88.8 kg, SpO2 94 %.    General: awake, alert, appropriate, somewhat irritable- kept telling nurse I said she could leave for pass, even though I was clear no; NAD HENT: conjugate gaze; oropharynx moist; NGT and O2 in place CV: regular rate; no JVD Pulmonary: CTA B/L; no W/R/R- good air movement GI: soft, NT, ND, (+)BS Psychiatric: irritable Neurological: alert  Musculoskeletal:        General: Tenderness (chest wall tender) present. Normal range of  motion.     Cervical back: Normal range of motion. Tr  LE edema Skin:    Comments: Midline chest incision clean and dry.  Neuro:Normal insight and awareness. Intact Memory. Normal language and speech. Strong voice, no dysarthria. Cranial nerve exam unremarkable. UE 4/5 prox to distal. LE: 3/5 HF, 4-/5 KE, 4/5 ADF/PF. Sensory exam normal for light touch and pain in all 4 limbs. No limb ataxia or cerebellar signs. No abnormal tone appreciated.   Assessment/Plan: 1. Functional deficits which require 3+ hours per day of interdisciplinary therapy in a comprehensive inpatient rehab setting. Physiatrist is providing close team supervision and 24 hour management of active medical problems listed below. Physiatrist and rehab team continue to assess barriers to discharge/monitor patient progress toward functional and medical goals  Care Tool:  Bathing    Body parts bathed by patient: Chest, Abdomen, Left arm, Right arm, Right lower leg, Left lower leg, Face, Right upper leg, Left upper leg, Front perineal area   Body parts bathed  by helper: Buttocks     Bathing assist Assist Level: Minimal Assistance - Patient > 75%     Upper Body Dressing/Undressing Upper body dressing   What is the patient wearing?: Pull over shirt    Upper body assist Assist Level: Supervision/Verbal cueing    Lower Body Dressing/Undressing Lower body dressing      What is the patient wearing?: Incontinence brief     Lower body assist Assist for lower body dressing: Minimal Assistance - Patient > 75%     Toileting Toileting    Toileting assist Assist for toileting: Moderate Assistance - Patient 50 - 74%     Transfers Chair/bed transfer  Transfers assist     Chair/bed transfer assist level: Minimal Assistance - Patient > 75%     Locomotion Ambulation   Ambulation assist      Assist level: Minimal Assistance - Patient > 75% Assistive device: Walker-rolling Max distance: 75'   Walk 10 feet  activity   Assist     Assist level: Minimal Assistance - Patient > 75% Assistive device: Walker-rolling   Walk 50 feet activity   Assist    Assist level: Minimal Assistance - Patient > 75% Assistive device: Walker-rolling    Walk 150 feet activity   Assist Walk 150 feet activity did not occur: Safety/medical concerns (Impulsiveness, fatigue)         Walk 10 feet on uneven surface  activity   Assist Walk 10 feet on uneven surfaces activity did not occur: Safety/medical concerns (Impulsiveness, fatigue)         Wheelchair     Assist Is the patient using a wheelchair?: Yes Type of Wheelchair: Manual    Wheelchair assist level: Dependent - Patient 0%      Wheelchair 50 feet with 2 turns activity    Assist        Assist Level: Dependent - Patient 0%   Wheelchair 150 feet activity     Assist      Assist Level: Dependent - Patient 0%   Blood pressure 130/70, pulse 88, temperature 97.8 F (36.6 C), resp. rate 18, height 5\' 7"  (1.702 m), weight 88.8 kg, SpO2 94 %.  Medical Problem List and Plan: 1.  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  Antithrombotics: -DVT/anticoagulation:  Pharmaceutical: Lovenox 40 mg QD             -antiplatelet therapy: Plavix 75 mg daily and aspirin 81 mg QD 3. Pain Management: oxycodone IR 5/10 mg every 3 hours PRN, Neurontin 600 mg TID  12/10- pain controlled- con't regimen 4. Mood: continue Prozac, LCSW to provide emotional support             -antipsychotic agents: n/a             -nicotine patch ordered for nicotine craving 14mg   12/11- pt wants to leave- likely due to nicotine cravings- unable to leave on pass 5. Neuropsych: This patient is capable of making decisions on her own behalf. 6. Skin/Wound Care: sternal wound cdi, can get  wet.  7. Fluids/Electrolytes/Nutrition: routine I/Os and follow-up chemistries 8. Hypertension: BP stable, rate controlled, continue Pacerone, Norvasc, Lopressor             -  bp controlled at present  12/11- BP controlled 130/70 this AM- con't regimen 9.Hyperlipidemia: continue Lipitor 10. Diabetes mellitus type 2: continue insuline regimen and CBG checks             -12/10- CBGs adequate control- con't regimen 11. COPD: continue Anoro Ellipta (prescribe at discharge) 12. Tobacco use: provide nicotine patch, smoking cessation counseling 13. PUD/GERD: continue PPI 14.  Acute blood loss anemia/leukocytosis.  Follow-up chemistries             -no signs of blood loss on exam 15.  Dysphagia.  Silent aspiration during recent MBS, needed cues to observe precautions with SLP, wet voice quality observed with honey thru cup. -Pt is NPO with snacks of purees/honey liquids by teaspoon only             -TF per cortrak  12/10- con't NPO- to see SLP today and verify diet  12/11- family brought in foods- explained she is unable to eat anything- has TF's to prevent pneumonia.  16. Foley             -has been in for diuresis             -recent hematuria d/t foley trauma             -remove foley in AM tomorrow and begin voiding trial.  12/10- remove foley- and cath if volumes >350cc- ordered bladder scans.    - foley was removed before pt came over- got call from nurse at 1:53 pm- having hematuria again- will check labs in AM to make sure not large amount of blood loss- don't want to place foley again due to recent hx of trauma FROM a foley- and see if can go without placing- if it gets worse, will call Urology.   12/11- Hb 9.2 from 9.7- will recheck again in AM and if still drops, will need to call Urology.  17. Resp failure- on O2  12/11- will wean O2 to try and get off O2. However does have COPD, so will see.       LOS: 2 days A FACE TO FACE EVALUATION WAS PERFORMED  Asusena Sigley 01/27/2021,  9:43 AM

## 2021-01-27 NOTE — Progress Notes (Signed)
Pt c/o nausea. MD made aware. Zofran 4mg  Q8 PRN order obtained.

## 2021-01-27 NOTE — Progress Notes (Signed)
Wound care to abdominal puncture wound. Old dressing with small strike through, wound bed pink, red, dull. Periwound CDI. Wound cleansed with NS, dried with gauze, bacitracin applied to wound bed, covered with 2x2, secured with tape. Pt tolerated well.

## 2021-01-28 DIAGNOSIS — R5381 Other malaise: Secondary | ICD-10-CM | POA: Diagnosis not present

## 2021-01-28 LAB — COMPREHENSIVE METABOLIC PANEL
ALT: 14 U/L (ref 0–44)
AST: 16 U/L (ref 15–41)
Albumin: 2.5 g/dL — ABNORMAL LOW (ref 3.5–5.0)
Alkaline Phosphatase: 65 U/L (ref 38–126)
Anion gap: 5 (ref 5–15)
BUN: 13 mg/dL (ref 6–20)
CO2: 35 mmol/L — ABNORMAL HIGH (ref 22–32)
Calcium: 8.4 mg/dL — ABNORMAL LOW (ref 8.9–10.3)
Chloride: 98 mmol/L (ref 98–111)
Creatinine, Ser: 0.72 mg/dL (ref 0.44–1.00)
GFR, Estimated: >60 mL/min (ref >60)
Glucose, Bld: 242 mg/dL — ABNORMAL HIGH (ref 70–99)
Potassium: 4 mmol/L (ref 3.5–5.1)
Sodium: 138 mmol/L (ref 135–145)
Total Bilirubin: 0.3 mg/dL (ref 0.3–1.2)
Total Protein: 6.5 g/dL (ref 6.5–8.1)

## 2021-01-28 LAB — GLUCOSE, CAPILLARY
Glucose-Capillary: 113 mg/dL — ABNORMAL HIGH (ref 70–99)
Glucose-Capillary: 124 mg/dL — ABNORMAL HIGH (ref 70–99)
Glucose-Capillary: 137 mg/dL — ABNORMAL HIGH (ref 70–99)
Glucose-Capillary: 157 mg/dL — ABNORMAL HIGH (ref 70–99)
Glucose-Capillary: 183 mg/dL — ABNORMAL HIGH (ref 70–99)
Glucose-Capillary: 214 mg/dL — ABNORMAL HIGH (ref 70–99)
Glucose-Capillary: 256 mg/dL — ABNORMAL HIGH (ref 70–99)

## 2021-01-28 LAB — URINALYSIS, ROUTINE W REFLEX MICROSCOPIC
Bilirubin Urine: NEGATIVE
Glucose, UA: NEGATIVE mg/dL
Ketones, ur: NEGATIVE mg/dL
Nitrite: NEGATIVE
Protein, ur: NEGATIVE mg/dL
Specific Gravity, Urine: 1.01 (ref 1.005–1.030)
pH: 6 (ref 5.0–8.0)

## 2021-01-28 LAB — CBC WITH DIFFERENTIAL/PLATELET
Abs Immature Granulocytes: 0.07 10*3/uL (ref 0.00–0.07)
Basophils Absolute: 0.1 10*3/uL (ref 0.0–0.1)
Basophils Relative: 0 %
Eosinophils Absolute: 1.1 10*3/uL — ABNORMAL HIGH (ref 0.0–0.5)
Eosinophils Relative: 9 %
HCT: 30.7 % — ABNORMAL LOW (ref 36.0–46.0)
Hemoglobin: 9.2 g/dL — ABNORMAL LOW (ref 12.0–15.0)
Immature Granulocytes: 1 %
Lymphocytes Relative: 21 %
Lymphs Abs: 2.6 10*3/uL (ref 0.7–4.0)
MCH: 26.8 pg (ref 26.0–34.0)
MCHC: 30 g/dL (ref 30.0–36.0)
MCV: 89.5 fL (ref 80.0–100.0)
Monocytes Absolute: 0.9 10*3/uL (ref 0.1–1.0)
Monocytes Relative: 7 %
Neutro Abs: 7.8 10*3/uL — ABNORMAL HIGH (ref 1.7–7.7)
Neutrophils Relative %: 62 %
Platelets: 447 10*3/uL — ABNORMAL HIGH (ref 150–400)
RBC: 3.43 MIL/uL — ABNORMAL LOW (ref 3.87–5.11)
RDW: 14.7 % (ref 11.5–15.5)
WBC: 12.4 10*3/uL — ABNORMAL HIGH (ref 4.0–10.5)
nRBC: 0 % (ref 0.0–0.2)

## 2021-01-28 LAB — URINALYSIS, MICROSCOPIC (REFLEX)
RBC / HPF: >50 RBC/hpf (ref 0–5)
WBC, UA: >50 WBC/hpf (ref 0–5)

## 2021-01-28 NOTE — Progress Notes (Signed)
Inpatient Rehabilitation  Patient information reviewed and entered into eRehab system by Toshua Honsinger M. Lucianne Smestad, M.A., CCC/SLP, PPS Coordinator.  Information including medical coding, functional ability and quality indicators will be reviewed and updated through discharge.    

## 2021-01-28 NOTE — Progress Notes (Signed)
Occupational Therapy Session Note  Patient Details  Name: Morgan Daniels MRN: [id number] Date of Birth: May 16, 1960  Today's Date: 01/28/2021 OT Individual Time: 6433-2951 OT Individual Time Calculation (min): 54 min    Short Term Goals: Week 1:  OT Short Term Goal 1 (Week 1): STGs = LTGs  Skilled Therapeutic Interventions/Progress Updates:  Skilled OT intervention completed with focus on ADL retraining, functional transfers and activity tolerance. Pt received seated on BSC with RN disconnecting the tube feed/administering AM meds. Completed sit > stand and stand pivot using RW with CGA to w/c. Completed bathing seated at sink with supervision, with verbal cues needed to wash specific areas, as pt reported pain meds kicking in and began acting a little loopy vs at start of session. Pt very distracted during self-care with cues needed to initiate and process next step. Pt required min A for UB dress, mainly due to management of tube feed that was pinned to shirt and O2 [place] cord. Pt able to complete LB dressing with mod A, but required safety cues to adhere to sternal precautions as pt attempted to pull/lift legs to figure 4 position versus lifting legs to body. Able to recall 1 sternal precaution, with education provided on the rest. Able to complete donning of pants over hips in stance with CGA for balance. Pt unaware/unconcerned with [place] being doffed for extended periods of time during self-care tasks. Education provided on importance of keeping O2 on for support. Pt completed grooming with supervision assist. Pt left seated in w/c, with chair alarm on, call bell and all other needs in reach at end of session.   Therapy Documentation Precautions:  Precautions Precautions: Fall, Sternal, Other (comment) (Cortrak) Precaution Comments: monitor O2 (does not wear at baseline) Restrictions Weight Bearing Restrictions: Yes RLE Weight Bearing: Weight bearing as tolerated LLE Weight Bearing: Weight  bearing as tolerated Other Position/Activity Restrictions: sternal precautions  Pain: No c/o pain   Therapy/Group: Individual Therapy  Cannon Quinton E Deven Furia 01/28/2021, 7:34 AM

## 2021-01-28 NOTE — Progress Notes (Signed)
Patient resting in bed. Insulin given as ordered. Jevity 15. Running as ordered. Patients family at bedside. Patient has a coke on her bedside table. Writer educated family and patient that she remains NPO that means nothing by mouth. NPO sign at door, also states no food in patients room. Coke removed. Patent HOB evaluated 45 degrees. Requests PRN Oxy 10 mg every 3-4 hours for sternal chest pain and lower back pain. OXY effective this AM. Call light and personal items within reach.

## 2021-01-28 NOTE — Progress Notes (Signed)
Patient ID: Morgan Daniels, female   DOB: 07/10/1960, 60 y.o.   MRN: [id number] Met with the patient to introduce self and role. Reviewed medical history, rehab routine, team conference and plan of care. Discussed dietary modification recommendations to initiate once on a po diet; continuing TF via cortack at present.  Reviewed smoking cessation, HTN, HLD, DM management (A1C 6.6) and COPD. Continue to follow along to discharge to address educational needs. Handouts given for reference after discharge. Margarito Liner

## 2021-01-28 NOTE — IPOC Note (Signed)
Overall Plan of Care [facility]) Patient Details Name: Morgan Daniels MRN: [id number] DOB: [birth year]  Admitting Diagnosis: Debility  Hospital Problems: Principal Problem:   Debility     Functional Problem List: Nursing Bowel, Medication Management, Safety, Endurance, Nutrition, Pain  PT Balance, Behavior, Endurance, Motor, Pain, Nutrition, Perception, Safety  OT Balance, Cognition, Endurance, Motor  SLP Cognition, Nutrition  TR         Basic ADL's: OT Bathing, Dressing, Toileting     Advanced  ADL's: OT       Transfers: PT Bed Mobility, Bed to Chair, Car, Manufacturing systems engineer, Metallurgist: PT Ambulation, Emergency planning/management officer, Stairs     Additional Impairments: OT None  SLP Swallowing, Social Cognition   Problem Solving, Attention, Awareness  TR      Anticipated Outcomes Item Anticipated Outcome  Self Feeding when pt is safe to eat, self feeding goal will be set later at independent  Swallowing  Supervision   Basic self-care  supervision  Toileting  supervision   Bathroom Transfers supervision  Bowel/Bladder  manage bowel w mod I assist  Transfers  S*  Locomotion  CGA  Communication     Cognition  Supervision  Pain  at or below level 4 with prn meds  Safety/Judgment  maintain safety w cues   Therapy Plan: PT Intensity: Minimum of 1-2 x/day ,45 to 90 minutes PT Frequency: 5 out of 7 days PT Duration Estimated Length of Stay: 7-10 days OT Intensity: Minimum of 1-2 x/day, 45 to 90 minutes OT Frequency: 5 out of 7 days OT Duration/Estimated Length of Stay: 7-10 days SLP Intensity: Minumum of 1-2 x/day, 30 to 90 minutes SLP Frequency: 3 to 5 out of 7 days SLP Duration/Estimated Length of Stay: 7-10 days   Due to the current state of emergency, patients may not be receiving their 3-hours of Medicare-mandated therapy.   Team Interventions: Nursing Interventions Disease Management/Prevention, Medication Management, Pain Management, Bowel  Management, Patient/Family Education, Dysphagia/Aspiration Precaution Training  PT interventions Ambulation/gait training, DME/adaptive equipment instruction, Psychosocial support, UE/LE Strength taining/ROM, Balance/vestibular training, Skin care/wound management, UE/LE Coordination activities, Visual/perceptual remediation/compensation, Splinting/orthotics, Functional mobility training, Cognitive remediation/compensation, Community reintegration, Neuromuscular re-education, IT trainer, Wheelchair propulsion/positioning, Therapeutic Activities, Pain management, Discharge planning, Disease management/prevention, Patient/family education, Therapeutic Exercise  OT Interventions Training and development officer, Cognitive remediation/compensation, Discharge planning, DME/adaptive equipment instruction, Functional mobility training, Patient/family education, Psychosocial support, Self Care/advanced ADL retraining, Therapeutic Activities, Therapeutic Exercise  SLP Interventions Cognitive remediation/compensation, Cueing hierarchy, Dysphagia/aspiration precaution training, Patient/family education, Internal/external aids  TR Interventions    SW/CM Interventions     Barriers to Discharge MD  Medical stability  Nursing Decreased caregiver support, Home environment access/layout 1 level 4 ste right rail w spouse; daughter to assist  PT Decreased caregiver support, Inaccessible home environment, Weight bearing restrictions, Behavior, Other (comments) (Sternal precautions)    OT      SLP      SW       Team Discharge Planning: Destination: PT-Home ,OT- Home , SLP-Home Projected Follow-up: PT-Home health PT, OT-  Home health OT, SLP-Home Health SLP Projected Equipment Needs: PT-To be determined, OT- 3 in 1 bedside comode, SLP-To be determined Equipment Details: PT- , OT-  Patient/family involved in discharge planning: PT- Patient,  OT-Patient, SLP-Patient  MD ELOS: 7-10 days  Medical Rehab Prognosis:   Excellent Assessment: Morgan Daniels is a 60 year old woman admitted to CIR with functional deficits secondary to 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 Medications are being  managed, and labs and vitals are being monitored regularly.  Currently requiring Cortrak, has post-incisional pain.   See Team Conference Notes for weekly updates to the plan of care

## 2021-01-28 NOTE — Progress Notes (Signed)
Speech Language Pathology Daily Session Note  Patient Details  Name: Morgan Daniels MRN: [id number] Date of Birth: September 04, 1960  Today's Date: 01/28/2021 SLP Individual Time: 1046-1130 SLP Individual Time Calculation (min): 44 min  Short Term Goals: Week 1: SLP Short Term Goal 1 (Week 1): STG=LTG due to ELOS  Skilled Therapeutic Interventions: Pt seen for skilled ST with focus on swallowing and cognitive goals. When SLP entered, pt states "I got a full cup of ice water this morning. I know I wasn't supposed to drink it but I didn't say anything. I didn't cough at all". SLP utilizing this as an opportunity for education re: current NPO status, rationale behind NPO status, risk of aspiration and potential aspiration complications. SLP utilizing results of MBS 12/6 for further education and support. Pt demonstrates decreased awareness to current deficits and intermittent agreement with recommendations. Pt encouraged to raise HOB as much as possible, agreed to ~60 degrees. Pt tolerating 4oz puree and 2 oz HTL via spoon with no overt s/s aspiration or apparent difficulty. Without cues, pt taking large bits of applesauce and overflowing spoonfuls of HTL. Pt was responsive to cues to reduce bolus size however did not carryover or utilize with independence. Husband entered at this point and reports he wants pt to come home as soon as possible. Also reports her cognition is not at baseline (pt reported it is) which pt states "well that will take time". Discussed team conference tomorrow (Tuesday) and recommendations for upcoming MBS this week to determine possibility for diet upgrade. Pt and family will benefit from ongoing education and training. Pt left in bed with alarm set and and husband present for needs. Cont ST POC.  Pain Pain Assessment Pain Scale: 0-10 Pain Score: 0-No pain  Therapy/Group: Individual Therapy  [name] 01/28/2021, 12:42 PM

## 2021-01-28 NOTE — Progress Notes (Signed)
PROGRESS NOTE   Subjective/Complaints: No new complaints this morning When asked, she does note pain along sternal incision. This affected her sleep last night but mostly she has been sleeping well.  Reviewed her lab work with her.   ROS:  Pt denies SOB, abd pain, CP, N/V/C/D, and vision changes   Objective:   No results found. Recent Labs    01/27/21 0328 01/28/21 0321  WBC 11.9* 12.4*  HGB 9.2* 9.2*  HCT 29.6* 30.7*  PLT 423* 447*   Recent Labs    01/27/21 0328 01/28/21 0321  NA 140 138  K 3.9 4.0  CL 99 98  CO2 35* 35*  GLUCOSE 210* 242*  BUN 11 13  CREATININE 0.51 0.72  CALCIUM 8.6* 8.4*    Intake/Output Summary (Last 24 hours) at 01/28/2021 1041 Last data filed at 01/28/2021 0400 Gross per 24 hour  Intake --  Output 457 ml  Net -457 ml        Physical Exam: Vital Signs Blood pressure 139/72, pulse 91, temperature 98.3 F (36.8 C), temperature source Oral, resp. rate 18, height 5\' 7"  (1.702 m), weight 88.8 kg, SpO2 90 %. General: awake, alert, appropriate, somewhat irritable, BMI 30.66 HENT: conjugate gaze; oropharynx moist; NGT and O2 in place CV: regular rate; no JVD Pulmonary: CTA B/L; no W/R/R- good air movement GI: soft, NT, ND, (+)BS Psychiatric: irritable Neurological: alert  Musculoskeletal:        General: Tenderness (chest wall tender) present. Normal range of motion.     Cervical back: Normal range of motion. Tr  LE edema Skin:    Comments: Midline chest incision clean and dry.  Neuro:Normal insight and awareness. Intact Memory. Normal language and speech. Strong voice, no dysarthria. Cranial nerve exam unremarkable. UE 4/5 prox to distal. LE: 3/5 HF, 4-/5 KE, 4/5 ADF/PF. Sensory exam normal for light touch and pain in all 4 limbs. No limb ataxia or cerebellar signs. No abnormal tone appreciated.   Assessment/Plan: 1. Functional deficits which require 3+ hours per day of  interdisciplinary therapy in a comprehensive inpatient rehab setting. Physiatrist is providing close team supervision and 24 hour management of active medical problems listed below. Physiatrist and rehab team continue to assess barriers to discharge/monitor patient progress toward functional and medical goals  Care Tool:  Bathing    Body parts bathed by patient: Right arm, Left arm, Chest, Abdomen, Face   Body parts bathed by helper: Buttocks     Bathing assist Assist Level: Supervision/Verbal cueing     Upper Body Dressing/Undressing Upper body dressing   What is the patient wearing?: Pull over shirt    Upper body assist Assist Level: Minimal Assistance - Patient > 75%    Lower Body Dressing/Undressing Lower body dressing      What is the patient wearing?: Pants     Lower body assist Assist for lower body dressing: Moderate Assistance - Patient 50 - 74%     Toileting Toileting    Toileting assist Assist for toileting: Minimal Assistance - Patient > 75%     Transfers Chair/bed transfer  Transfers assist     Chair/bed transfer assist level: Contact Guard/Touching assist  Locomotion Ambulation   Ambulation assist      Assist level: Minimal Assistance - Patient > 75% Assistive device: No Device Max distance: 134ft   Walk 10 feet activity   Assist     Assist level: Minimal Assistance - Patient > 75% Assistive device: No Device   Walk 50 feet activity   Assist    Assist level: Minimal Assistance - Patient > 75% Assistive device: No Device    Walk 150 feet activity   Assist Walk 150 feet activity did not occur: Safety/medical concerns (Impulsiveness, fatigue)  Assist level: Minimal Assistance - Patient > 75% Assistive device: No Device    Walk 10 feet on uneven surface  activity   Assist Walk 10 feet on uneven surfaces activity did not occur: Safety/medical concerns (Impulsiveness, fatigue)         Wheelchair     Assist  Is the patient using a wheelchair?: Yes Type of Wheelchair: Manual    Wheelchair assist level: Dependent - Patient 0%      Wheelchair 50 feet with 2 turns activity    Assist        Assist Level: Dependent - Patient 0%   Wheelchair 150 feet activity     Assist      Assist Level: Dependent - Patient 0%   Blood pressure 139/72, pulse 91, temperature 98.3 F (36.8 C), temperature source Oral, resp. rate 18, height 5\' 7"  (1.702 m), weight 88.8 kg, SpO2 90 %.  Medical Problem List and Plan: 1.  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 Impaired mobility, ambulating 75 feet: continue Lovenox 40 mg QD             -antiplatelet therapy: Plavix 75 mg daily and aspirin 81 mg QD 3. Pain Management: oxycodone IR 5/10 mg every 3 hours PRN, Neurontin 600 mg TID  12/10- pain controlled- con't regimen 4. Mood: continue Prozac, LCSW to provide emotional support             -antipsychotic agents: n/a             -nicotine patch ordered for nicotine craving 14mg   12/11- pt wants to leave- likely due to nicotine cravings- unable to leave on pass 5. Neuropsych: This patient is capable of making decisions on her own behalf. 6. Skin/Wound Care: sternal wound cdi, can get wet.  7. Fluids/Electrolytes/Nutrition: routine I/Os and follow-up chemistries 8. Hypertension: BP stable, rate controlled, continue Pacerone, Norvasc, Lopressor             -bp controlled at present  12/11- BP controlled 130/70 this AM- con't regimen 9.Hyperlipidemia: continue Lipitor 10. Diabetes mellitus type 2: continue insuline regimen and CBG checks             -12/10- CBGs adequate control- con't regimen 11. COPD: continue Anoro Ellipta (prescribe at discharge) 12. Tobacco use: provide nicotine patch, smoking cessation counseling 13.  PUD/GERD: continue PPI 14.  Acute blood loss anemia/leukocytosis.  Follow-up chemistries             -no signs of blood loss on exam 15.  Dysphagia.  Silent aspiration during recent MBS, needed cues to observe precautions with SLP, wet voice quality observed with honey thru cup. -  Pt is NPO with snacks of purees/honey liquids by teaspoon only             -TF per cortrak  12/10- con't NPO- to see SLP today and verify diet  12/11- family brought in foods- explained she is unable to eat anything- has TF's to prevent pneumonia.  16. Foley             -has been in for diuresis             -recent hematuria d/t foley trauma             -remove foley in AM tomorrow and begin voiding trial.  12/10- remove foley- and cath if volumes >350cc- ordered bladder scans.    - foley was removed before pt came over- got call from nurse at 1:53 pm- having hematuria again- will check labs in AM to make sure not large amount of blood loss- don't want to place foley again due to recent hx of trauma FROM a foley- and see if can go without placing- if it gets worse, will call Urology.   12/11- Hb 9.2 from 9.7- will recheck again in AM and if still drops, will need to call Urology.  17. Resp failure- on O2  12/11- will wean O2 to try and get off O2. However does have COPD, so will see.  18. Obesity BMI 30.66: provide dietary education 19. Leukocytosis: trending upward slightly, CBC with diff ordered for tomorrow  20. Disposition: messaged April to schedule hospital follow-up.        LOS: 3 days A FACE TO FACE EVALUATION WAS PERFORMED  Clide Deutscher Layli Capshaw 01/28/2021, 10:41 AM

## 2021-01-28 NOTE — Progress Notes (Signed)
Physical Therapy Session Note  Patient Details  Name: Morgan Daniels MRN: [id number] Date of Birth: 07-05-1960  Today's Date: 01/28/2021 PT Individual Time: 9528-4132 and 1400-1425 PT Individual Time Calculation (min): 54 min and 25 min  Short Term Goals: Week 1:  PT Short Term Goal 1 (Week 1): STG = LTG due to ELOS  Skilled Therapeutic Interventions/Progress Updates:   Treatment Session 1 Received pt sitting in WC finishing OT session. Pt agreeable to PT treatment and reported pain 3/10 along sternal incision (premedicated). Pt demonstrated poor adherence to sternal precautions throughout session. Session with emphasis on functional mobility, dynamic standing balance, gait training, toileting, and improved endurance with activity. O2 sat 89% on 3L via [place] at rest. Pt transported to/from room in [facility] total A for time management purposes. Sit<>stand with RW and CGA and ambulated 129ft with RW and CGA. Pt with R inattention coming close to running into obstacles on R side numerous times. O2 sat 86% on 3L - increased to 4L and O2 sat 93%. Pt reported mild SOB after ambulating, requiring seated rest break. Sit<>stand without AD and min A and ambulated additional 117ft without AD and min A. Pt "furniture walking" on R side due to "not trusting her legs" and feeling "jelly legged". O2 sat 85% increasing to 92% on 4L after seated rest break. Pt then performed single leg toe taps to 6in step without UE support and min A 2x10 bilaterally. Pt required cues to recall sternal precautions, frequently pushing up with BUEs to stand. Worked on blocked practice sit<>stands with arms crossed over chest to maintain sternal precautions x 4 reps to fatigue. Pt then reported urge to have BM and transferred mat<>WC stand<>pivot without AD and CGA. Pt ambulated 83ft x 2 trials without AD and CGA in/out of bathroom with total A to manage O2 tank and holding onto walls/doors for stability. Pt able to manage clothing with CGA and  continent of bowel and bladder. Pt able to perform peri-care seated with distant supervision and increased time and stood at sink and washed hands with CGA. Pt requested to return to bed and transferred sit<>supine with supervision. Concluded session with pt semi-reclined in bed, needs within reach, and bed alarm on. Pt left on 3L O2 via [place].   Treatment Session 2 Received pt semi-reclined in bed with call bell going off and husband present at bedside. Pt reported urge to toilet and found without O2 on. Reminded pt of importance of wearing O2, donned O2, and sats increased to 95% on 3L. RN notified and present to disconnect and untangle Cortrak from O2 tubing. Pt transferred supine<>sitting EOB with HOB elevated and use of bedrails with supervision. Pt ambulated 86ft x 2 trials without AD and CGA/light min A in/out of bathroom with total A to manage O2 tank. Pt able to manage clothing with CGA and continent of bladder - noted hematuria. Pt also reported increased vaginal itching - notified MD. Pt able to perform peri-care with supervision and stood at sink and washed hands with close supervision. Pt easily distracted by TV and finding pictures on her phone, requiring cues for redirection. Concluded session with pt sitting in WC, needs within reach, and chair pad alarm on. O2 sat 90% on 3L via . RN present re-connecting Cortrak.   Therapy Documentation Precautions:  Precautions Precautions: Fall, Sternal, Other (comment) (Cortrak) Precaution Comments: monitor O2 (does not wear at baseline) Restrictions Weight Bearing Restrictions: Yes RLE Weight Bearing: Weight bearing as tolerated LLE Weight Bearing: Weight  bearing as tolerated Other Position/Activity Restrictions: sternal precautions  Therapy/Group: Individual Therapy Morgan Daniels PT, DPT   01/28/2021, 7:20 AM

## 2021-01-29 ENCOUNTER — Ambulatory Visit: Payer: Medicare Other | Admitting: Nutrition

## 2021-01-29 ENCOUNTER — Ambulatory Visit: Payer: Medicare Other | Admitting: "Endocrinology

## 2021-01-29 DIAGNOSIS — R5381 Other malaise: Secondary | ICD-10-CM | POA: Diagnosis not present

## 2021-01-29 LAB — CBC WITH DIFFERENTIAL/PLATELET
Abs Immature Granulocytes: 0.07 10*3/uL (ref 0.00–0.07)
Basophils Absolute: 0.1 10*3/uL (ref 0.0–0.1)
Basophils Relative: 1 %
Eosinophils Absolute: 1 10*3/uL — ABNORMAL HIGH (ref 0.0–0.5)
Eosinophils Relative: 9 %
HCT: 28.8 % — ABNORMAL LOW (ref 36.0–46.0)
Hemoglobin: 8.8 g/dL — ABNORMAL LOW (ref 12.0–15.0)
Immature Granulocytes: 1 %
Lymphocytes Relative: 22 %
Lymphs Abs: 2.6 10*3/uL (ref 0.7–4.0)
MCH: 27.2 pg (ref 26.0–34.0)
MCHC: 30.6 g/dL (ref 30.0–36.0)
MCV: 89.2 fL (ref 80.0–100.0)
Monocytes Absolute: 0.9 10*3/uL (ref 0.1–1.0)
Monocytes Relative: 8 %
Neutro Abs: 7.3 10*3/uL (ref 1.7–7.7)
Neutrophils Relative %: 59 %
Platelets: 385 10*3/uL (ref 150–400)
RBC: 3.23 MIL/uL — ABNORMAL LOW (ref 3.87–5.11)
RDW: 15 % (ref 11.5–15.5)
WBC: 11.9 10*3/uL — ABNORMAL HIGH (ref 4.0–10.5)
nRBC: 0 % (ref 0.0–0.2)

## 2021-01-29 LAB — URINE CULTURE

## 2021-01-29 LAB — GLUCOSE, CAPILLARY
Glucose-Capillary: 107 mg/dL — ABNORMAL HIGH (ref 70–99)
Glucose-Capillary: 143 mg/dL — ABNORMAL HIGH (ref 70–99)
Glucose-Capillary: 152 mg/dL — ABNORMAL HIGH (ref 70–99)
Glucose-Capillary: 153 mg/dL — ABNORMAL HIGH (ref 70–99)
Glucose-Capillary: 159 mg/dL — ABNORMAL HIGH (ref 70–99)
Glucose-Capillary: 183 mg/dL — ABNORMAL HIGH (ref 70–99)
Glucose-Capillary: 194 mg/dL — ABNORMAL HIGH (ref 70–99)

## 2021-01-29 MED ORDER — CEPHALEXIN 250 MG PO CAPS
500.0000 mg | ORAL_CAPSULE | Freq: Two times a day (BID) | ORAL | Status: DC
  Administered 2021-01-29 – 2021-01-30 (×3): 500 mg via ORAL
  Filled 2021-01-29 (×3): qty 2

## 2021-01-29 NOTE — Progress Notes (Signed)
Speech Language Pathology Daily Session Note  Patient Details  Name: Morgan Daniels MRN: [id number] Date of Birth: 05-26-1960  Today's Date: 01/29/2021 SLP Individual Time: 1431-1510 SLP Individual Time Calculation (min): 39 min  Short Term Goals: Week 1: SLP Short Term Goal 1 (Week 1): STG=LTG due to ELOS  Skilled Therapeutic Interventions: Pt seen for skilled ST with focus on cognitive and swallowing goals, pt in bed with spouse present. Per nursing reports last night and this morning, pt continues to be noncompliant with NPO status with family bringing outside food and drink for pt. SLP facilitating oral care by providing Supervision A for thoroughness before performing swallow assessment of multiple solid textures and liquid consistencies due to pt reports of wanting to d/c and noncompliance. Pt tolerating single ice chip with no overt s/s aspiration. Pt consuming ~1.5 oz thin liquid via spoon with overt s/s aspiration on ~50% of trials (coughing and throat clearing). Trials halted at this time and education provided on s/s aspiration which pt verbalized understanding to but will benefit from ongoing training. Pt consuming ~4 oz puree with no overt s/s aspiration, min A cues for swallow strategies (primarily small bites) and navigation of self-feeding with Cortrak tube placement. Pt demonstrates poor carryover of strategies independently. Pt tolerating 2 bites Dys 2 solids before deferring due to dislike. Pt consuming 1 oz HTL with no overt s/s aspiration, 3 oz NTL with occasional throat clear after swallow. Plan for MBS tomorrow at 9:30 to determine current swallow function, anatomy and physiology. Pt in agreement, will communicate with family and medical team after test to determine further goals of treatment. Pt left in bed with alarm set and all needs within reach. Cont ST POC.   Pain Pain Assessment Pain Scale: 0-10 Pain Score: 0-No pain  Therapy/Group: Individual Therapy  Morgan Daniels 01/29/2021, 3:12 PM

## 2021-01-29 NOTE — Progress Notes (Signed)
Physical Therapy Session Note  Patient Details  Name: Morgan Daniels MRN: [id number] Date of Birth: [birth year]  Today's Date: 01/29/2021 PT Individual Time: 0900-1008 PT Individual Time Calculation (min): 68 min   Short Term Goals: Week 1:  PT Short Term Goal 1 (Week 1): STG = LTG due to ELOS  Skilled Therapeutic Interventions/Progress Updates:   Received pt semi-reclined in bed with husband, RN, and MD present at bedside. Pt requesting to D/C on Saturday, informed pt that PT will need to speak with rest of treatment team (OT and SLP) prior to determining D/C date. MD, RN, and PT all reinforced importance of NPO precautions as pt's family members have been bringing in beverages. RN disconnected pt from Cortrak and provided additional medications. Pt agreeable to PT treatment and reported mild pain along sternal incision (unrated). Session with emphasis on functional mobility, generalized strengthening, dynamic standing balance/coordination, gait training, stair navigation, and improved activity tolerance. O2 sat 85% on 3L - increased to 4L and O2 sat increased to 88%. O2 sat remained >90% with activity on 4L throughout remainder of session. Pt performed bed mobility with supervision and transferred bed<>WC stand<>pivot without AD and CGA. Pt transported to/from room in [facility] dependently for time management purposes. Pt navigated 4 steps with 1 rail and min A ascending with a step through and descending with a step to pattern, forwards. Pt with increased instability when descending and reported legs felt "weak". Took seated rest break and demonstrated technique for side stepping. Pt then navigated additional 4 steps with BUE support on 1 rail with CGA using a lateral stepping technique - recommended pt use this technique at home. Pt transferred sit<>stand with RW and supervision and ambulated 132ft with RW and supervision. Pt with decreased attention overall, coming close to hitting obstacles on both L and  R side of hallway. Pt performed BLE strengthening on Nustep at workload 4 for 8 minutes for a total of 171 steps with emphasis on cardiovascular endurance - required cues for attention as pt getting distracted looking out windows and talking, frequently stopping on Nustep. Pt also took off O2 and when therapist reminded pt to put it back on, pt not placing it properly inside nose. Pt impulsively stood from [place]ustep to get back to [facility] before therapist was ready; educated pt on safety and waiting for therapist before transferring. Pt ambulated additional 158ft without AD and CGA. Challenged pt to avoid holding onto countertops at nurses station, however pt continued to grab on for support. At end of session pt asked therapist for ice water - again educated on NPO precautions but pt demonstrates very very poor carry over. Concluded session with pt sitting in WC, needs within reach, and seatbelt alarm on. Pt left on 3L O2 via [place] with O2 sat 91%.   Of note, pt continues to require cues to maintain sternal precautions with functional tasks and frequently pushes up using BUEs.   Therapy Documentation Precautions:  Precautions Precautions: Fall, Sternal, Other (comment) (Cortrak) Precaution Comments: monitor O2 (does not wear at baseline) Restrictions Weight Bearing Restrictions: No RLE Weight Bearing: Weight bearing as tolerated LLE Weight Bearing: Weight bearing as tolerated Other Position/Activity Restrictions: sternal precautions  Therapy/Group: Individual Therapy Alfonse Alpers PT, DPT   01/29/2021, 7:21 AM

## 2021-01-29 NOTE — Progress Notes (Signed)
Occupational Therapy Session Note  Patient Details  Name: Morgan Daniels MRN: [id number] Date of Birth: 04-Mar-1960  Today's Date: 01/29/2021 OT Individual Time: 3016-0109 & 3235-5732 OT Individual Time Calculation (min): 53 min & 40 min   Short Term Goals: Week 1:  OT Short Term Goal 1 (Week 1): STGs = LTGs  Skilled Therapeutic Interventions/Progress Updates:  Session 1 Skilled OT intervention completed with focus on shower transfers, functional bed mobility, and ADL retraining. Pt received in bed, agreeable to session. Pt completed bed mobility with min A, with cues needed to adhere to sternal precautions. Pt only able to report 1 sternal precaution- re-educated pt on precautions. Pt sit > stand and ambulated with RW with CGA to tub bench in walk in shower in pt's room, with pt able to side step without RW and using grab bars to complete transfer with CGA-supervision. Pt able to practice standing by using R grab bar only due to pt's shower set up at home to stand with supervision, then side step to walker with CGA. Pt with c/o difficulty with bed mobility with bed at home being king-sized flat bed. Mass practice of bed mobility completed with education on log rolling technique provided to promote increased independence and adherence to precautions with pt progressing from min A for EOB <> semi-supine in bed, to supervision assist. Seated EOB, pt able to donn pants with min A for threading, with difficulty leaning forward or bringing legs up for figure 4. Education provided on technique of using reacher for dressing, and LH sponge for bathing tasks, with plan to practice next self-care session. Pt left in bed with HOB > 30 degrees, bed alarm on and all needs in reach at end of session.  Session 2 Skilled OT intervention completed with focus on functional ambulation, activity tolerance, home management and AE education. Pt received seated EOB, with RN in room, administering med/disconnecting tube  feed. Educated on reacher and LH sponge use for LB self-care, with demonstration provided. Pt reporting she uses LH sponge at home. Sit > stand using RW with supervision, then ambulated about 100 ft to apartment using RW and CGA. Pt demonstrating inattention L>R, with cues needed to position self away from the wall with pt bumping into items noted. Pt completed simulated shower transfer without AD, for stepping over small lip, with CGA and cues needed for sequencing. Education provided on safety considerations with showering, and transfers, as well as the need to have supervision at all times with higher level transfers. Pt ambulated using RW back to room, with minor scissoring gait and pt able to locate/identify objects on L and R as therapist called them out. Educated on the importance of slowing down movements to improve safety awareness due to L inattention. Completed bed mobility with supervision. Pt left with HOB > 30 degrees, with bed alarm on and all needs in reach at end of session.   Therapy Documentation Precautions:  Precautions Precautions: Fall, Sternal, Other (comment) (Cortrak) Precaution Comments: monitor O2 (does not wear at baseline) Restrictions Weight Bearing Restrictions: No RLE Weight Bearing: Weight bearing as tolerated LLE Weight Bearing: Weight bearing as tolerated Other Position/Activity Restrictions: sternal precautions  Pain: No c/o pain   Therapy/Group: Individual Therapy  Shakevia Sarris E Rigo Letts 01/29/2021, 7:51 AM

## 2021-01-29 NOTE — Progress Notes (Signed)
PROGRESS NOTE   Subjective/Complaints: Patient's family has been bringing foods in for her from outside despite extensive education from [employer]. Discussed with patient importance of avoiding food outside of speech therapy trials to minimize infection risk  ROS:  Pt denies SOB, abd pain, CP, N/V/C/D, and vision changes, +sternal pain   Objective:   No results found. Recent Labs    01/28/21 0321 01/29/21 0444  WBC 12.4* 11.9*  HGB 9.2* 8.8*  HCT 30.7* 28.8*  PLT 447* 385   Recent Labs    01/27/21 0328 01/28/21 0321  NA 140 138  K 3.9 4.0  CL 99 98  CO2 35* 35*  GLUCOSE 210* 242*  BUN 11 13  CREATININE 0.51 0.72  CALCIUM 8.6* 8.4*    Intake/Output Summary (Last 24 hours) at 01/29/2021 0936 Last data filed at 01/29/2021 0830 Gross per 24 hour  Intake 700 ml  Output 1650 ml  Net -950 ml        Physical Exam: Vital Signs Blood pressure (!) 145/71, pulse 84, temperature 98.4 F (36.9 C), temperature source Oral, resp. rate 20, height 5\' 7"  (1.702 m), weight 89.1 kg, SpO2 (!) 89 %. General: awake, alert, appropriate, somewhat irritable, BMI 30.66 HENT: conjugate gaze; oropharynx moist; NGT and O2 in place CV: regular rate; no JVD Pulmonary: CTA B/L; no W/R/R- good air movement, desat to 89% GI: soft, NT, ND, (+)BS Psychiatric: irritable Neurological: alert  Musculoskeletal:        General: Tenderness (chest wall tender) present. Normal range of motion.     Cervical back: Normal range of motion. Tr  LE edema Skin:    Comments: Midline chest incision clean and dry.  Neuro:Normal insight and awareness. Intact Memory. Normal language and speech. Strong voice, no dysarthria. Cranial nerve exam unremarkable. UE 4/5 prox to distal. LE: 3/5 HF, 4-/5 KE, 4/5 ADF/PF. Sensory exam normal for light touch and pain in all 4 limbs. No limb ataxia or cerebellar signs. No abnormal tone  appreciated.   Assessment/Plan: 1. Functional deficits which require 3+ hours per day of interdisciplinary therapy in a comprehensive inpatient rehab setting. Physiatrist is providing close team supervision and 24 hour management of active medical problems listed below. Physiatrist and rehab team continue to assess barriers to discharge/monitor patient progress toward functional and medical goals  Care Tool:  Bathing    Body parts bathed by patient: Right arm, Left arm, Chest, Abdomen, Face   Body parts bathed by helper: Buttocks     Bathing assist Assist Level: Supervision/Verbal cueing     Upper Body Dressing/Undressing Upper body dressing   What is the patient wearing?: Pull over shirt    Upper body assist Assist Level: Minimal Assistance - Patient > 75%    Lower Body Dressing/Undressing Lower body dressing      What is the patient wearing?: Pants     Lower body assist Assist for lower body dressing: Moderate Assistance - Patient 50 - 74%     Toileting Toileting    Toileting assist Assist for toileting: Minimal Assistance - Patient > 75%     Transfers Chair/bed transfer  Transfers assist     Chair/bed transfer assist  level: Contact Guard/Touching assist     Locomotion Ambulation   Ambulation assist      Assist level: Minimal Assistance - Patient > 75% Assistive device: No Device Max distance: 174ft   Walk 10 feet activity   Assist     Assist level: Minimal Assistance - Patient > 75% Assistive device: No Device   Walk 50 feet activity   Assist    Assist level: Minimal Assistance - Patient > 75% Assistive device: No Device    Walk 150 feet activity   Assist Walk 150 feet activity did not occur: Safety/medical concerns (Impulsiveness, fatigue)  Assist level: Minimal Assistance - Patient > 75% Assistive device: No Device    Walk 10 feet on uneven surface  activity   Assist Walk 10 feet on uneven surfaces activity did not  occur: Safety/medical concerns (Impulsiveness, fatigue)         Wheelchair     Assist Is the patient using a wheelchair?: Yes Type of Wheelchair: Manual    Wheelchair assist level: Dependent - Patient 0%      Wheelchair 50 feet with 2 turns activity    Assist        Assist Level: Dependent - Patient 0%   Wheelchair 150 feet activity     Assist      Assist Level: Dependent - Patient 0%   Blood pressure (!) 145/71, pulse 84, temperature 98.4 F (36.9 C), temperature source Oral, resp. rate 20, height 5\' 7"  (1.702 m), weight 89.1 kg, SpO2 (!) 89 %.  Medical Problem List and Plan: 1.  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 Impaired mobility, ambulating 75 feet: continue Lovenox 40 mg QD             -antiplatelet therapy: Plavix 75 mg daily and aspirin 81 mg QD 3. Pain Management: oxycodone IR 5/10 mg every 3 hours PRN, Neurontin 600 mg TID  12/10- pain controlled- con't regimen 4. Mood: continue Prozac, LCSW to provide emotional support             -antipsychotic agents: n/a             -nicotine patch ordered for nicotine craving 14mg   12/11- pt wants to leave- likely due to nicotine cravings- unable to leave on pass 5. Neuropsych: This patient is capable of making decisions on her own behalf. 6. Skin/Wound Care: sternal wound cdi, can get wet.  7. Fluids/Electrolytes/Nutrition: routine I/Os and follow-up chemistries 8. Hypertension: BP stable, rate controlled, continue Pacerone, Norvasc, Lopressor             -bp controlled at present  12/11- BP controlled 130/70 this AM- con't regimen 9.Hyperlipidemia: continue Lipitor 10. Diabetes mellitus type 2: continue insuline regimen and CBG checks             -12/10- CBGs adequate control- con't regimen 11. COPD: continue Anoro Ellipta  (prescribe at discharge) 12. Tobacco use: provide nicotine patch, smoking cessation counseling 13. PUD/GERD: continue PPI 14.  Acute blood loss anemia/leukocytosis.  Follow-up chemistries             -no signs of blood loss on exam 15.  Dysphagia.  Silent aspiration during recent MBS, needed cues to observe precautions  with SLP, wet voice quality observed with honey thru cup. -Pt is NPO with snacks of purees/honey liquids by teaspoon only             -TF per cortrak  Provided education about infection risk if she continues to consume foods brought by family.  16. Foley             -has been in for diuresis             -recent hematuria d/t foley trauma             -remove foley in AM tomorrow and begin voiding trial.  12/10- remove foley- and cath if volumes >350cc- ordered bladder scans.    - foley was removed before pt came over- got call from nurse at 1:53 pm- having hematuria again- will check labs in AM to make sure not large amount of blood loss- don't want to place foley again due to recent hx of trauma FROM a foley- and see if can go without placing- if it gets worse, will call Urology.   12/11- Hb 9.2 from 9.7- will recheck again in AM and if still drops, will need to call Urology.  17. Resp failure- on O2  12/11- will wean O2 to try and get off O2. However does have COPD, so will see.  18. Obesity BMI 30.66: provide dietary education 19. Leukocytosis: ttrending down, possibly 2/2 UTI: treat as below.  20. UTI: Keflex 500mg  BID ordered 21. Disposition: HFU scheduled       LOS: 4 days A FACE TO FACE EVALUATION WAS PERFORMED  Clide Deutscher Lugene Hitt 01/29/2021, 9:36 AM

## 2021-01-29 NOTE — Progress Notes (Signed)
Upon start of shift this AM patient requesting coffee. Patient reeducated that she is NPO and nothing is to be put in her mouth unless with speech therapist. Patients husband then arrived and was requesting coffee.  Again educated patients husband that patient is NPO and she can not have anything by mouth. Husband than requested coffee for himself. Educated patient and husband that no outside food or beverage were allowed in the room and he would have to go downstairs and drink coffee in the cafeteria. Husband then stated my wife can have thickened coffee.  Educated NPO means nothing by mouth. Dr. Adam Phenix notified to help educate family and patient of NPO status. Call light and personal items all within reach. All cups removed from patients room. Safety maintained.

## 2021-01-29 NOTE — Telephone Encounter (Signed)
Melissa do you have an update on this?

## 2021-01-30 ENCOUNTER — Inpatient Hospital Stay (HOSPITAL_COMMUNITY): Payer: Medicare Other

## 2021-01-30 LAB — GLUCOSE, CAPILLARY
Glucose-Capillary: 117 mg/dL — ABNORMAL HIGH (ref 70–99)
Glucose-Capillary: 124 mg/dL — ABNORMAL HIGH (ref 70–99)
Glucose-Capillary: 132 mg/dL — ABNORMAL HIGH (ref 70–99)
Glucose-Capillary: 153 mg/dL — ABNORMAL HIGH (ref 70–99)
Glucose-Capillary: 159 mg/dL — ABNORMAL HIGH (ref 70–99)
Glucose-Capillary: 181 mg/dL — ABNORMAL HIGH (ref 70–99)
Glucose-Capillary: 205 mg/dL — ABNORMAL HIGH (ref 70–99)
Glucose-Capillary: 216 mg/dL — ABNORMAL HIGH (ref 70–99)

## 2021-01-30 MED ORDER — SIMETHICONE 40 MG/0.6ML PO SUSP
40.0000 mg | Freq: Four times a day (QID) | ORAL | Status: DC | PRN
  Administered 2021-01-30: 40 mg via ORAL
  Filled 2021-01-30: qty 0.6

## 2021-01-30 MED ORDER — GABAPENTIN 600 MG PO TABS
600.0000 mg | ORAL_TABLET | Freq: Three times a day (TID) | ORAL | Status: DC
  Administered 2021-01-30 – 2021-02-01 (×6): 600 mg via ORAL
  Filled 2021-01-30 (×7): qty 1

## 2021-01-30 MED ORDER — PANTOPRAZOLE 2 MG/ML SUSPENSION
40.0000 mg | Freq: Every day | ORAL | Status: DC
  Administered 2021-01-31 – 2021-02-01 (×2): 40 mg via ORAL
  Filled 2021-01-30 (×2): qty 20

## 2021-01-30 MED ORDER — AMIODARONE HCL 200 MG PO TABS: 400.0000 mg | ORAL_TABLET | Freq: Every day | ORAL | Status: DC

## 2021-01-30 MED ORDER — DOCUSATE SODIUM 50 MG/5ML PO LIQD: 200.0000 mg | Freq: Every day | ORAL | Status: DC | PRN

## 2021-01-30 MED ORDER — CLOPIDOGREL BISULFATE 75 MG PO TABS
75.0000 mg | ORAL_TABLET | Freq: Every day | ORAL | Status: DC
  Administered 2021-01-31 – 2021-02-01 (×2): 75 mg via ORAL
  Filled 2021-01-30 (×2): qty 1

## 2021-01-30 MED ORDER — ONDANSETRON HCL 4 MG PO TABS
4.0000 mg | ORAL_TABLET | Freq: Three times a day (TID) | ORAL | Status: DC | PRN
  Filled 2021-01-30: qty 1

## 2021-01-30 MED ORDER — ATORVASTATIN CALCIUM 80 MG PO TABS
80.0000 mg | ORAL_TABLET | Freq: Every day | ORAL | Status: DC
  Administered 2021-01-31 – 2021-02-01 (×2): 80 mg via ORAL
  Filled 2021-01-30 (×2): qty 1

## 2021-01-30 MED ORDER — OXYCODONE HCL 5 MG PO TABS: 5.0000 mg | ORAL_TABLET | ORAL | Status: DC | PRN

## 2021-01-30 MED ORDER — METOPROLOL TARTRATE 25 MG PO TABS
25.0000 mg | ORAL_TABLET | Freq: Two times a day (BID) | ORAL | Status: DC
  Administered 2021-01-30 – 2021-02-01 (×4): 25 mg via ORAL
  Filled 2021-01-30 (×4): qty 1

## 2021-01-30 MED ORDER — FLUCONAZOLE 150 MG PO TABS
150.0000 mg | ORAL_TABLET | Freq: Once | ORAL | Status: AC
Start: 2021-01-30 — End: 2021-01-30
  Administered 2021-01-30: 12:00:00 150 mg via ORAL
  Filled 2021-01-30: qty 1

## 2021-01-30 MED ORDER — OXYCODONE HCL 5 MG PO TABS
5.0000 mg | ORAL_TABLET | ORAL | Status: DC | PRN
  Administered 2021-01-30 – 2021-01-31 (×2): 10 mg via ORAL
  Filled 2021-01-30 (×2): qty 2

## 2021-01-30 MED ORDER — CEPHALEXIN 250 MG PO CAPS
500.0000 mg | ORAL_CAPSULE | Freq: Two times a day (BID) | ORAL | Status: DC
  Administered 2021-01-30 – 2021-02-01 (×4): 500 mg via ORAL
  Filled 2021-01-30 (×4): qty 2

## 2021-01-30 MED ORDER — ASPIRIN 81 MG PO CHEW
81.0000 mg | CHEWABLE_TABLET | Freq: Every day | ORAL | Status: DC
  Administered 2021-01-31 – 2021-02-01 (×2): 81 mg via ORAL
  Filled 2021-01-30 (×2): qty 1

## 2021-01-30 MED ORDER — SACCHAROMYCES BOULARDII 250 MG PO CAPS
250.0000 mg | ORAL_CAPSULE | Freq: Two times a day (BID) | ORAL | Status: DC
  Administered 2021-01-30 – 2021-02-01 (×5): 250 mg via ORAL
  Filled 2021-01-30 (×5): qty 1

## 2021-01-30 MED ORDER — AMLODIPINE BESYLATE 10 MG PO TABS
10.0000 mg | ORAL_TABLET | Freq: Every day | ORAL | Status: DC
  Administered 2021-01-31 – 2021-02-01 (×2): 10 mg via ORAL
  Filled 2021-01-30 (×2): qty 1

## 2021-01-30 MED ORDER — FLUOXETINE HCL 20 MG PO CAPS
80.0000 mg | ORAL_CAPSULE | Freq: Every day | ORAL | Status: DC
  Administered 2021-01-31 – 2021-02-01 (×2): 80 mg via ORAL
  Filled 2021-01-30 (×2): qty 4

## 2021-01-30 MED ORDER — CEPHALEXIN 250 MG PO CAPS: 500.0000 mg | ORAL_CAPSULE | Freq: Two times a day (BID) | ORAL | Status: DC

## 2021-01-30 NOTE — Discharge Summary (Signed)
Physician Discharge Summary  Patient ID: Morgan Daniels MRN: [id number] DOB/AGE: Dec 11, 1960 60 y.o.  Admit date: 01/25/2021 Discharge date: 02/01/2021  Discharge Diagnoses:  Principal Problem:   Debility Pain management Mood stabilization Hypertension Hyperlipidemia Diabetes mellitus COPD/tobacco use GERD Acute blood loss anemia Dysphagia  Discharged Condition: Stable  Significant Diagnostic Studies: DG Abdomen 1 View  Result Date: 01/12/2021 CLINICAL DATA:  Status post OG tube placement. EXAM: ABDOMEN - 1 VIEW COMPARISON:  September 30, 2010 FINDINGS: An orogastric tube is seen with its distal tip noted within the body of the stomach. The distal side hole sits approximately 5.3 cm distal to the expected region of the gastroesophageal junction. The bowel gas pattern is normal. Radiopaque surgical clips are noted within the right upper quadrant. No radio-opaque calculi or other significant radiographic abnormality are seen. Radiopaque pedicle screws are present within the visualized portion of the lower lumbar spine. IMPRESSION: Orogastric tube positioning, as described above. Electronically Signed   By: Virgina Norfolk M.D.   On: 01/12/2021 21:35   CT HEAD WO CONTRAST (5MM)  Result Date: 01/12/2021 CLINICAL DATA:  Mental status change.  Unknown cause EXAM: CT HEAD WITHOUT CONTRAST TECHNIQUE: Contiguous axial images were obtained from the base of the skull through the vertex without intravenous contrast. COMPARISON:  CT head 02/03/2019 BRAIN: BRAIN Cerebral ventricle sizes are concordant with the degree of cerebral volume loss. Patchy and confluent areas of decreased attenuation are noted throughout the deep and periventricular white matter of the cerebral hemispheres bilaterally, compatible with chronic microvascular ischemic disease. No evidence of large-territorial acute infarction. No parenchymal hemorrhage. No mass lesion. No extra-axial collection. No mass effect or midline shift.  No hydrocephalus. Basilar cisterns are patent. Vascular: No hyperdense vessel. Atherosclerotic calcifications are present within the cavernous internal carotid arteries. Skull: No acute fracture or focal lesion. Sinuses/Orbits: Paranasal sinuses and mastoid air cells are clear. The orbits are unremarkable. Other: None. IMPRESSION: No acute intracranial abnormality. Electronically Signed   By: Iven Finn M.D.   On: 01/12/2021 22:09   CT Angio Chest PE W and/or Wo Contrast  Result Date: 01/12/2021 CLINICAL DATA:  Chest pain and shortness of breath. EXAM: CT ANGIOGRAPHY CHEST WITH CONTRAST TECHNIQUE: Multidetector CT imaging of the chest was performed using the standard protocol during bolus administration of intravenous contrast. Multiplanar CT image reconstructions and MIPs were obtained to evaluate the vascular anatomy. CONTRAST:  85mL OMNIPAQUE IOHEXOL 350 MG/ML SOLN COMPARISON:  Chest radiograph dated 01/12/2021. FINDINGS: Evaluation of this exam is limited due to respiratory motion artifact. Cardiovascular: Borderline cardiomegaly. No pericardial effusion. Advanced 3 vessel coronary vascular calcification. Moderate atherosclerotic calcification of the thoracic aorta. No aneurysmal dilatation. Evaluation of the pulmonary arteries is very limited due to respiratory motion artifact. No large or central pulmonary artery embolus identified. Mediastinum/Nodes: Bilateral hilar adenopathy measures 17 mm in short axis on the right. An enteric tube is noted within the esophagus. No mediastinal fluid collection. Lungs/Pleura: Diffuse interstitial and interlobular septal prominence consistent with edema. Diffuse ground-glass and nodular densities throughout the lungs with patchy areas of consolidation bilaterally and predominantly involving the lower lobes concerning for superimposed pneumonia. Clinical correlation is recommended. Small bilateral pleural effusions. No pneumothorax. Endotracheal tube with tip 4 cm  above the carina. The central airways are patent. Upper Abdomen: Probable fatty liver. Musculoskeletal: No acute osseous pathology. Review of the MIP images confirms the above findings. IMPRESSION: 1. No CT evidence of central pulmonary artery embolus. 2. Pulmonary edema with probable multilobar pneumonia. Clinical correlation  is recommended. 3. Bilateral hilar adenopathy, likely reactive. 4. Aortic Atherosclerosis (ICD10-I70.0). Electronically Signed   By: Anner Crete M.D.   On: 01/12/2021 22:13   CARDIAC CATHETERIZATION  Result Date: 01/13/2021   Prox RCA lesion is 100% stenosed.   Dist LM to Ost LAD lesion is 95% stenosed.   Ost Cx lesion is 95% stenosed.   Mid Cx lesion is 70% stenosed.   Mid LAD lesion is 75% stenosed.   Mid LM to Dist LM lesion is 80% stenosed.   The left ventricular ejection fraction is 35-45% by visual estimate. 1.  Non-ST elevation myocardial infarction 2.  Three-vessel coronary artery disease with 80% distal left main, 95% stenosis ostial LAD, 95% stenosis ostial left circumflex, occluded proximal RCA 3.  Mildly reduced left ventricular function with estimated LV ejection fraction 40% with anteroapical and inferoapical hypokinesis 4.  Intra-aortic balloon pump placed Recommendations 1.  Transfer to [facility] for urgent coronary artery bypass graft surgery   PERIPHERAL VASCULAR CATHETERIZATION  Result Date: 01/03/2021 See surgical note for result.  DG Chest Port 1 View  Result Date: 01/24/2021 CLINICAL DATA:  Pleural effusion. EXAM: PORTABLE CHEST 1 VIEW COMPARISON:  January 23, 2021. FINDINGS: Stable cardiomegaly. Mild bibasilar atelectasis is again noted with associated pleural effusions. Right-sided PICC line is unchanged in position. Bony thorax is unremarkable. Status post coronary bypass graft. IMPRESSION: Stable bibasilar atelectasis is noted with associated pleural effusions. Electronically Signed   By: Marijo Conception M.D.   On: 01/24/2021 08:49   DG  Chest Port 1 View  Result Date: 01/23/2021 CLINICAL DATA:  Sore chest, post CABG EXAM: PORTABLE CHEST 1 VIEW COMPARISON:  01/22/2021 FINDINGS: Right PICC line remains present. Enteric tube is again partially imaged entering the stomach. Bilateral chest tubes no longer present. Similar cardiomegaly. Bibasilar atelectasis. Pulmonary vascular congestion. Probable small pleural effusions. No pneumothorax. IMPRESSION: Chest tube is no longer present.  No pneumothorax. Cardiomegaly with pulmonary vascular congestion. Similar bibasilar atelectasis and probable small pleural effusions. Electronically Signed   By: Macy Mis M.D.   On: 01/23/2021 09:00   DG Chest Port 1 View  Result Date: 01/22/2021 CLINICAL DATA:  Recent coronary bypass surgery, shortness of breath EXAM: PORTABLE CHEST 1 VIEW COMPARISON:  Previous studies including the examination of 01/20/2021 FINDINGS: There is interval removal of endotracheal tube. Enteric tube is noted traversing the esophagus. Left chest tube and mediastinal drain have not changed significantly in position. Tip of PICC line is seen in the superior vena cava. Transverse diameter of heart is increased. Central pulmonary vessels are prominent. Increased density is seen in both lower lung fields. There is blunting of lateral CP angles. There is no pneumothorax. IMPRESSION: There is increased density in both lower lung fields suggesting bilateral pleural effusions and possibly underlying infiltrates. Electronically Signed   By: Elmer Picker M.D.   On: 01/22/2021 08:20   DG Chest Port 1 View  Result Date: 01/20/2021 CLINICAL DATA:  Pleural effusion EXAM: PORTABLE CHEST 1 VIEW COMPARISON:  Chest x-ray dated 01/19/2021. FINDINGS: 0523 hours. Tubes, lines and drains are stable in position. Heart size and mediastinal contours are stable. Subtle opacities persist at the bilateral lung bases, likely atelectasis and/or small pleural effusions. No new lung findings. No  pneumothorax is seen. IMPRESSION: 1. Stable chest x-ray. Subtle opacities at the bilateral lung bases, likely atelectasis and/or small pleural effusions. 2. Support apparatus appears stable in position. Electronically Signed   By: Franki Cabot M.D.   On:  01/20/2021 08:28   DG Chest Port 1 View  Result Date: 01/19/2021 CLINICAL DATA:  Status post CABG x4 EXAM: PORTABLE CHEST 1 VIEW COMPARISON:  Chest x-rays dated 01/18/2021. FINDINGS: Support apparatus appears stable in position. Heart size and mediastinal contours are stable. Probable mild atelectasis and/or small pleural effusion at the LEFT lung base. No pneumothorax is seen. IMPRESSION: Stable chest x-ray. Probable mild atelectasis and/or small pleural effusion at the LEFT lung base. No pneumothorax. Support apparatus appears stable in position. Electronically Signed   By: Franki Cabot M.D.   On: 01/19/2021 08:53   DG Chest Port 1 View  Result Date: 01/18/2021 CLINICAL DATA:  Cardiogenic shock EXAM: PORTABLE CHEST 1 VIEW COMPARISON:  01/18/2021 at 5:28 a.m. FINDINGS: Interval median sternotomy and CABG. Endotracheal tube 3.5 cm above the carina. Right internal jugular central venous catheter tip: Brachiocephalic confluence. Right PICC line tip: SVC. Nasogastric tube enters the stomach. Mediastinal drain noted. Left-sided chest tube is in place. Epicardial pacer leads are present. No pneumothorax. Obscured left hemidiaphragm favoring atelectasis. Mild enlargement of the cardiopericardial silhouette, without findings of acute pulmonary edema. Possible mild right perihilar atelectasis. IMPRESSION: 1. Postop day 0 status post CABG. Tubes and lines appear satisfactorily position. No pneumothorax identified. 2. Obscuration left hemidiaphragm likely due to left lower lobe atelectasis. 3. Mild enlargement of the cardiopericardial silhouette, without current findings of pulmonary edema. Electronically Signed   By: Van Clines M.D.   On: 01/18/2021 18:50    DG Chest Port 1 View  Result Date: 01/18/2021 CLINICAL DATA:  Balloon pump. EXAM: PORTABLE CHEST 1 VIEW COMPARISON:  January 17, 2021. FINDINGS: Stable cardiomediastinal silhouette. Endotracheal and nasogastric tubes are unchanged in position. Distal tip of balloon catheter is seen projected over expected position of proximal descending thoracic aorta and is unchanged compared to prior exam. Mild left lower lobe atelectasis or infiltrate is noted. Bony thorax is unremarkable. Right-sided PICC line is unchanged in position. IMPRESSION: Mild left lower lobe pneumonia or atelectasis is noted. Stable support apparatus as described above. Electronically Signed   By: Marijo Conception M.D.   On: 01/18/2021 08:54   DG Chest Port 1 View  Result Date: 01/17/2021 CLINICAL DATA:  Endotracheal tube, pulmonary edema. EXAM: PORTABLE CHEST 1 VIEW COMPARISON:  Chest radiograph dated January 16, 2021 FINDINGS: The heart is enlarged. Pulmonary vascular congestion has slightly improved from prior examination. Bibasilar atelectasis. Endotracheal tube and feeding tube coursing below the diaphragm with tip not included. IMPRESSION: 1. Stable cardiomegaly. Interval improvement in the pulmonary vascular congestion. No focal consolidation or large pleural effusion. 2.  Lines and tubes are unchanged. Electronically Signed   By: Keane Police D.O.   On: 01/17/2021 08:59   DG CHEST PORT 1 VIEW  Result Date: 01/16/2021 CLINICAL DATA:  Respiratory failure EXAM: PORTABLE CHEST 1 VIEW COMPARISON:  01/16/2021, 01/15/2021, 01/14/2021, CT 01/12/2021 FINDINGS: Endotracheal tube tip is about 2.6 cm superior to carina. Esophageal tube tip below the diaphragm but incompletely visualized. Cardiomegaly with vascular congestion and bilateral pulmonary edema. Similar bibasilar airspace disease. Right upper extremity central venous catheter tip over the SVC. IMPRESSION: 1. Support lines and tubes as above 2. Similar cardiomegaly with vascular  congestion and pulmonary edema. Continued bibasilar atelectasis or pneumonia. Electronically Signed   By: Donavan Foil M.D.   On: 01/16/2021 20:48   DG Chest Port 1 View  Result Date: 01/16/2021 CLINICAL DATA:  Endotracheal tube present EXAM: PORTABLE CHEST 1 VIEW COMPARISON:  01/15/2021 FINDINGS: Endotracheal tube, enteric tube,  and right PICC line are again identified. Similar shallow inspiration with low lung volumes. Patchy opacities bilaterally are without substantial change. No pleural effusion or pneumothorax. Similar cardiomediastinal contours. IMPRESSION: No significant change since 01/15/2021. Persistent bilateral opacities. Electronically Signed   By: Macy Mis M.D.   On: 01/16/2021 08:32   DG Chest Port 1 View  Result Date: 01/15/2021 CLINICAL DATA:  Endotracheal tube present EXAM: PORTABLE CHEST 1 VIEW COMPARISON:  01/14/2021 FINDINGS: Endotracheal tube, enteric tube, and right PICC line are present. Shallow inspiration with low lung volumes. Patchy opacities bilaterally. No pleural effusion or pneumothorax. Similar cardiomediastinal contours. IMPRESSION: Persistent bilateral opacities. Aeration at the right lung base is improved. Electronically Signed   By: Macy Mis M.D.   On: 01/15/2021 08:24   DG CHEST PORT 1 VIEW  Result Date: 01/14/2021 CLINICAL DATA:  Initial evaluation for acute respiratory failure with hypoxia. EXAM: PORTABLE CHEST 1 VIEW COMPARISON:  Radiograph from 01/12/2021 FINDINGS: Endotracheal tube in place with tip at the superior margin of the clavicles. Diffuse related pad overlies the left chest. Stable cardiomegaly. Mediastinal silhouette within normal limits. Lungs mildly hypoinflated. Persistent bilateral interstitial and airspace disease, most pronounced at the right lung base. Overall appearance is improved, suspected to reflect improved edema. Probable small bilateral pleural effusions. No pneumothorax. Osseous structures are unchanged. IMPRESSION: 1.  Tip of the endotracheal tube at the superior margin of the clavicles. 2. Persistent bilateral airspace disease, most pronounced at the right lung base. Overall appearance is mildly improved from prior, favored to reflect improved edema. Superimposed infection/infiltrates not excluded. 3. Probable small bilateral pleural effusions. Electronically Signed   By: Jeannine Boga M.D.   On: 01/14/2021 02:37   DG Chest Portable 1 View  Result Date: 01/12/2021 CLINICAL DATA:  et tube placement.  SOB EXAM: PORTABLE CHEST 1 VIEW COMPARISON:  None. FINDINGS: Endotracheal tube with tip terminating 6 cm above the carina. Enteric tube coursing below the hemidiaphragm with tip and side port collimated off view. Lines and tubes as well as cardiac paddles overlie the chest. The heart and mediastinal contours are within normal limits. Increased interstitial markings and diffuse patchy airspace opacities. No pleural effusion. No pneumothorax. No acute osseous abnormality. IMPRESSION: 1. Diffuse interstitial and airspace opacities suggestive of pulmonary edema. Superimposed infection/inflammation not excluded. 2. Endotracheal tube in good position. 3. Enteric tube coursing below the hemidiaphragm with tip and side port collimated off view. Electronically Signed   By: Iven Finn M.D.   On: 01/12/2021 21:34   DG Abd Portable 1V  Result Date: 01/25/2021 CLINICAL DATA:  Feeding tube placement. EXAM: PORTABLE ABDOMEN - 1 VIEW COMPARISON:  01/21/2021 FINDINGS: Feeding tube tip is in the distal stomach, at the pylorus with the tip directed towards the duodenum. Contrast material visible in nondistended colon of the upper abdomen. IMPRESSION: Feeding tube tip is at the pylorus. Electronically Signed   By: Misty Stanley M.D.   On: 01/25/2021 10:39   DG Abd Portable 1V  Result Date: 01/21/2021 CLINICAL DATA:  Feeding tube placement. EXAM: PORTABLE ABDOMEN - 1 VIEW COMPARISON:  None. FINDINGS: The bowel gas pattern is  normal. Distal tip of feeding tube is seen in expected position of distal stomach. No radio-opaque calculi or other significant radiographic abnormality are seen. IMPRESSION: Distal tip of feeding tube is seen in expected position of distal stomach. Electronically Signed   By: Marijo Conception M.D.   On: 01/21/2021 14:45   DG Abd Portable 1V  Result Date: 01/14/2021  CLINICAL DATA:  Encounter for orogastric tube EXAM: PORTABLE ABDOMEN - 1 VIEW COMPARISON:  Chest CT 2 days ago FINDINGS: Enteric tube with tip and side port at the stomach. Artifact from EKG leads. There may be an aortic balloon pump with marker 4.4 cm below the aortic knob, likely accentuated by abdominal projection. Diffuse patchy airspace density. Bowel gas pattern is nonobstructive. Renal contrast from prior CT. IMPRESSION: 1. Enteric tube with tip and side port at the stomach. 2. Aortic balloon pump with marker approximately 4 cm below the aortic knob. Electronically Signed   By: Jorje Guild M.D.   On: 01/14/2021 04:46   DG Swallowing Func-Speech Pathology  Result Date: 01/30/2021 Table formatting from the original result was not included. Objective Swallowing Evaluation: Type of Study: MBS-Modified Barium Swallow Study  Patient Details Name: Morgan Daniels MRN: [id number] Date of Birth: [birth year] Today's Date: 01/30/2021 Past Medical History: Past Medical History: Diagnosis Date  Anxiety   Arthritis   Bilateral carotid artery stenosis 2014  Carotid artery occlusion   Chronic kidney disease May 2017  UTI  Chronic kidney disease 2017  Current smoker   CVA (cerebral vascular accident) ([place]) 2013  Depression   Diabetes ([place])   Diverticulosis   Fatty liver   Fibromyalgia   GERD (gastroesophageal reflux disease)   H/O hiatal hernia   Hiatal hernia   Hypercholesteremia   Hypertension   IBS (irritable bowel syndrome)   PAD (peripheral artery disease) ([place])   Peptic ulcer   Plantar fasciitis   Stroke [facility]) Dec. 14,2013  Right side-ministroke   T2DM (type 2 diabetes mellitus) ([place])  Past Surgical History: Past Surgical History: Procedure Laterality Date  ABDOMINAL HYSTERECTOMY  1990  BACK SURGERY  2000, 2004  CAROTID ENDARTERECTOMY Left 05/06/12  CARPAL TUNNEL RELEASE Left 07/18/2015  Procedure: CARPAL TUNNEL RELEASE;  Surgeon: Earnestine Leys, MD;  Location: [facility] ORS;  Service: Orthopedics;  Laterality: Left;  CEREBRAL ANGIOGRAM Bilateral 05/03/2012  Procedure: CEREBRAL ANGIOGRAM;  Surgeon: Angelia Mould, MD;  Location: [facility] CATH LAB;  Service: Cardiovascular;  Laterality: Bilateral;  CHOLECYSTECTOMY  2001  COLONOSCOPY WITH PROPOFOL N/A 11/19/2018  Procedure: COLONOSCOPY WITH PROPOFOL;  Surgeon: Lucilla Lame, MD;  Location: [place];  Service: Endoscopy;  Laterality: N/A;  Diabetic - insulin  CORONARY ARTERY BYPASS GRAFT N/A 01/18/2021  Procedure: CORONARY ARTERY BYPASS GRAFTING (CABG) x 4  USING LEFT INTERNAL MAMMARY ARTERY AND LEFT ENDOSCOPIC GREATER SAPHENOUS VEIN CONDUITS;  Surgeon: [name], MD;  Location: [place];  Service: Open Heart Surgery;  Laterality: N/A;  CORONARY/GRAFT ACUTE MI REVASCULARIZATION N/A 01/13/2021  Procedure: Coronary/Graft Acute MI Revascularization;  Surgeon: Isaias Cowman, MD;  Location: [place] CV LAB;  Service: Cardiovascular;  Laterality: N/A;  ENDARTERECTOMY Left 05/06/2012  Procedure: ENDARTERECTOMY CAROTID;  Surgeon: Angelia Mould, MD;  Location: [place];  Service: Vascular;  Laterality: Left;  ENDARTERECTOMY Right 08/09/2013  Procedure: ENDARTERECTOMY CAROTID-RIGHT;  Surgeon: Angelia Mould, MD;  Location: [place];  Service: Vascular;  Laterality: Right;  ENDOVEIN HARVEST OF GREATER SAPHENOUS VEIN Left 01/18/2021  Procedure: ENDOVEIN HARVEST OF GREATER SAPHENOUS VEIN;  Surgeon: [name], MD;  Location: [place];  Service: Open Heart Surgery;  Laterality: Left;  HERNIA REPAIR    IABP INSERTION Right 01/13/2021  Procedure: IABP Insertion;  Surgeon: Isaias Cowman,  MD;  Location: [place] CV LAB;  Service: Cardiovascular;  Laterality: Right;  LEFT HEART CATH AND CORONARY ANGIOGRAPHY N/A 01/13/2021  Procedure: LEFT HEART CATH AND CORONARY ANGIOGRAPHY;  Surgeon: Isaias Cowman, MD;  Location: [place] CV LAB;  Service: Cardiovascular;  Laterality: N/A;  LOWER EXTREMITY ANGIOGRAPHY Left 01/03/2021  Procedure: LOWER EXTREMITY ANGIOGRAPHY;  Surgeon: Algernon Huxley, MD;  Location: [place] CV LAB;  Service: Cardiovascular;  Laterality: Left;  PATCH ANGIOPLASTY Left 05/06/2012  Procedure: WITH DACRON PATCH ANGIOPLASTY ;  Surgeon: Angelia Mould, MD;  Location: [place];  Service: Vascular;  Laterality: Left;  POLYPECTOMY  11/19/2018  Procedure: POLYPECTOMY;  Surgeon: Lucilla Lame, MD;  Location: [place];  Service: Endoscopy;;  SPINE SURGERY  2004  TEE WITHOUT CARDIOVERSION N/A 01/18/2021  Procedure: TRANSESOPHAGEAL ECHOCARDIOGRAM (TEE);  Surgeon: [name], MD;  Location: [place];  Service: Open Heart Surgery;  Laterality: N/A;  TONSILLECTOMY    TUBAL LIGATION   HPI: See H&P  Subjective: alert, denies h/o dysphagia  Recommendations for follow up therapy are one component of a multi-disciplinary discharge planning process, led by the attending physician.  Recommendations may be updated based on patient status, additional functional criteria and insurance authorization. Assessment / Plan / Recommendation Clinical Impressions 01/30/2021 Clinical Impression Patients overall swallow function appears improved since initial MBS but patient continues to demonstrate a mild oropharyngeal dysphagia. Oral phase is characterized by prolonged mastication with solid textures.  Pharyngeal phase is characterized by reduced pharyngeal squeeze and vestibular closure resulting in mostly flash penetration of liquids with trace amounts of penetrates that did clear after completion of the swallow intermittently.  Mild residue noted throughout the pharynx that was  reduced with spontaneous use of multiple swallows. Recommend patient initiate a diet of Dys. 2 textures with thin liquids. Patient and husband verbalized understanding with a handout given regarding appropriate food textures. SLP Visit Diagnosis Dysphagia, oropharyngeal phase (R13.12) Attention and concentration deficit following -- Frontal lobe and executive function deficit following -- Impact on safety and function Mild aspiration risk   Treatment Recommendations 01/30/2021 Treatment Recommendations Therapy as outlined in treatment plan below   Prognosis 01/22/2021 Prognosis for Safe Diet Advancement Good Barriers to Reach Goals Cognitive deficits Barriers/Prognosis Comment -- Diet Recommendations 01/30/2021 SLP Diet Recommendations Dysphagia 2 (Fine chop) solids;Thin liquid Liquid Administration via Cup Medication Administration Crushed with puree Compensations Slow rate;Small sips/bites;Effortful swallow;Multiple dry swallows after each bite/sip Postural Changes Seated upright at 90 degrees   Other Recommendations 01/30/2021 Recommended Consults -- Oral Care Recommendations Oral care BID Other Recommendations -- Follow Up Recommendations Outpatient SLP Assistance recommended at discharge Intermittent Supervision/Assistance Functional Status Assessment -- Frequency and Duration  01/30/2021 Speech Therapy Frequency (ACUTE ONLY) min 3x week Treatment Duration 1 week   Oral Phase 01/30/2021 Oral Phase Impaired Oral - Pudding Teaspoon -- Oral - Pudding Cup -- Oral - Honey Teaspoon NT Oral - Honey Cup NT Oral - Nectar Teaspoon WFL Oral - Nectar Cup WFL Oral - Nectar Straw -- Oral - Thin Teaspoon -- Oral - Thin Cup WFL Oral - Thin Straw WFL Oral - Puree WFL Oral - Mech Soft -- Oral - Regular Impaired mastication Oral - Multi-Consistency -- Oral - Pill -- Oral Phase - Comment --  Pharyngeal Phase 01/30/2021 Pharyngeal Phase Impaired Pharyngeal- Pudding Teaspoon -- Pharyngeal -- Pharyngeal- Pudding Cup -- Pharyngeal --  Pharyngeal- Honey Teaspoon NT Pharyngeal -- Pharyngeal- Honey Cup NT Pharyngeal -- Pharyngeal- Nectar Teaspoon Delayed swallow initiation-vallecula;Penetration/Aspiration during swallow;Reduced pharyngeal peristalsis;Pharyngeal residue - valleculae Pharyngeal Material enters airway, remains ABOVE vocal cords and not ejected out Pharyngeal- Nectar Cup Delayed swallow initiation-vallecula;Penetration/Aspiration during swallow;Reduced pharyngeal peristalsis;Pharyngeal residue - valleculae Pharyngeal Material enters  airway, remains ABOVE vocal cords and not ejected out Pharyngeal- Nectar Straw -- Pharyngeal -- Pharyngeal- Thin Teaspoon -- Pharyngeal -- Pharyngeal- Thin Cup Delayed swallow initiation-vallecula;Penetration/Aspiration during swallow Pharyngeal Material enters airway, remains ABOVE vocal cords and not ejected out Pharyngeal- Thin Straw Delayed swallow initiation-vallecula;Penetration/Aspiration during swallow Pharyngeal Material enters airway, remains ABOVE vocal cords and not ejected out Pharyngeal- Puree Delayed swallow initiation-vallecula;Reduced pharyngeal peristalsis;Pharyngeal residue - valleculae Pharyngeal -- Pharyngeal- Mechanical Soft -- Pharyngeal -- Pharyngeal- Regular Delayed swallow initiation-vallecula;Pharyngeal residue - valleculae Pharyngeal -- Pharyngeal- Multi-consistency -- Pharyngeal -- Pharyngeal- Pill -- Pharyngeal -- Pharyngeal Comment --  Cervical Esophageal Phase  01/22/2021 Cervical Esophageal Phase WFL Pudding Teaspoon -- Pudding Cup -- Honey Teaspoon -- Honey Cup -- Nectar Teaspoon -- Nectar Cup -- Nectar Straw -- Thin Teaspoon -- Thin Cup -- Thin Straw -- Puree -- Mechanical Soft -- Regular -- Multi-consistency -- Pill -- Cervical Esophageal Comment -- PAYNE, COURTNEY 01/30/2021, 3:31 PM      Weston Anna, MA, CCC-SLP                DG Swallowing Func-Speech Pathology  Result Date: 01/22/2021 Table formatting from the original result was not included. Objective  Swallowing Evaluation: Type of Study: MBS-Modified Barium Swallow Study  Patient Details Name: Morgan Daniels MRN: [id number] Date of Birth: 1960/10/09 Today's Date: 01/22/2021 Time: SLP Start Time (ACUTE ONLY): 6195 -SLP Stop Time (ACUTE ONLY): 1330 SLP Time Calculation (min) (ACUTE ONLY): 17 min Past Medical History: Past Medical History: Diagnosis Date  Anxiety   Bilateral carotid artery stenosis 2014  Chronic kidney disease 2017  Current smoker   CVA (cerebral vascular accident) ([place]) 2013  Depression   Diverticulosis   Fatty liver   Fibromyalgia   GERD (gastroesophageal reflux disease)   Hiatal hernia   Hypercholesteremia   Hypertension   IBS (irritable bowel syndrome)   PAD (peripheral artery disease) ([place])   Peptic ulcer   T2DM (type 2 diabetes mellitus) ([place])  Past Surgical History: Past Surgical History: Procedure Laterality Date  CORONARY ARTERY BYPASS GRAFT N/A 01/18/2021  Procedure: CORONARY ARTERY BYPASS GRAFTING (CABG) x 4  USING LEFT INTERNAL MAMMARY ARTERY AND LEFT ENDOSCOPIC GREATER SAPHENOUS VEIN CONDUITS;  Surgeon: [name], MD;  Location: [place];  Service: Open Heart Surgery;  Laterality: N/A;  CORONARY/GRAFT ACUTE MI REVASCULARIZATION N/A 01/13/2021  Procedure: Coronary/Graft Acute MI Revascularization;  Surgeon: Isaias Cowman, MD;  Location: [place] [facility];  Service: Cardiovascular;  Laterality: N/A;  ENDOVEIN HARVEST OF GREATER SAPHENOUS VEIN Left 01/18/2021  Procedure: ENDOVEIN HARVEST OF GREATER SAPHENOUS VEIN;  Surgeon: [name], MD;  Location: [place];  Service: Open Heart Surgery;  Laterality: Left;  IABP INSERTION Right 01/13/2021  Procedure: IABP Insertion;  Surgeon: Isaias Cowman, MD;  Location: [place] [facility];  Service: Cardiovascular;  Laterality: Right;  LEFT HEART CATH AND CORONARY ANGIOGRAPHY N/A 01/13/2021  Procedure: LEFT HEART CATH AND CORONARY ANGIOGRAPHY;  Surgeon: Isaias Cowman, MD;  Location: [place] [facility];  Service:  Cardiovascular;  Laterality: N/A;  TEE WITHOUT CARDIOVERSION N/A 01/18/2021  Procedure: TRANSESOPHAGEAL ECHOCARDIOGRAM (TEE);  Surgeon: [name], MD;  Location: [place];  Service: Open Heart Surgery;  Laterality: N/A; HPI: Pt is a 60 yo female who presented to the [facility] ED on 01/12/2021 via EMS due to acute respiratory failure and unresponsiveness. ETT 11/26-11/27; reintubated 11/27 with CODE STEMI and extubated 11/30; developed stridor and vomiting with concern for aspiration so reintubated 11/30 until self-extubated 12/4. Pt s/p CABG x4  on 12/2. PMH includes: PAD, HTN, HLD, current tobacco abuse, COPD, DM2, CVA, GERD, hiatal hernia, anxiety  Subjective: alert, denies h/o dysphagia  Recommendations for follow up therapy are one component of a multi-disciplinary discharge planning process, led by the attending physician.  Recommendations may be updated based on patient status, additional functional criteria and insurance authorization. Assessment / Plan / Recommendation Clinical Impressions 01/22/2021 Clinical Impression Pt presents with a moderate pharyngeal more than oral dysphagia, although she does have mildly reduced lingual propulsion with small amounts of lingual residue with solids. Pharyngeally she has reduced hyolaryngeal excursion, base of tongue retraction, and pharyngeal squeeze. Subsequently this results in incomplete epiglottic inversion and laryngeal vestibule closure, with possible post-extubation choices also impacting glottic closure given dysphonia. Residue increases as trials become thicker, leaving more diffuse residue with thicker liquids and solids. She performs a second swallow spontaneously that reduces but doesn't always eliminate this residue. Boluses enter into her airway before the swallow when she is not able to achieve adequate closure, aspirating thin liquids most consistently compared to other consistencies, although penetration is deep with thicker liquids and resulted in  aspiration with honey thick liquids via cup x1 as well. A chin tuck does not increase airway protection or reduce residue. Pt has the most airway protection with purees and honey thick liquids by spoon, although risk for aspiration is still present given the amount of residue in her pharynx combined also with her mentation. Considering her recent cardiac surgery and repeated intubations, recommend initiating snacks only of purees and honey thick liquids by spoon to allow her to start more gradually trying POs to increase use of her swallowing musculature. SLP will also f/u for potential pharyngeal exercises. SLP Visit Diagnosis Dysphagia, oropharyngeal phase (R13.12) Attention and concentration deficit following -- Frontal lobe and executive function deficit following -- Impact on safety and function Moderate aspiration risk   Treatment Recommendations 01/22/2021 Treatment Recommendations Therapy as outlined in treatment plan below   Prognosis 01/22/2021 Prognosis for Safe Diet Advancement Good Barriers to Reach Goals Cognitive deficits Barriers/Prognosis Comment -- Diet Recommendations 01/22/2021 SLP Diet Recommendations NPO;Alternative means - temporary;Other (Comment) Liquid Administration via Spoon Medication Administration Via alternative means Compensations Slow rate;Small sips/bites;Effortful swallow;Multiple dry swallows after each bite/sip Postural Changes Seated upright at 90 degrees   Other Recommendations 01/22/2021 Recommended Consults -- Oral Care Recommendations Oral care QID Other Recommendations Have oral suction available Follow Up Recommendations Acute inpatient rehab (3hours/day) Assistance recommended at discharge Intermittent Supervision/Assistance Functional Status Assessment Patient has had a recent decline in their functional status and demonstrates the ability to make significant improvements in function in a reasonable and predictable amount of time. Frequency and Duration  01/22/2021 Speech  Therapy Frequency (ACUTE ONLY) min 2x/week Treatment Duration 2 weeks   Oral Phase 01/22/2021 Oral Phase Impaired Oral - Pudding Teaspoon -- Oral - Pudding Cup -- Oral - Honey Teaspoon WFL Oral - Honey Cup WFL Oral - Nectar Teaspoon WFL Oral - Nectar Cup WFL Oral - Nectar Straw -- Oral - Thin Teaspoon -- Oral - Thin Cup WFL Oral - Thin Straw -- Oral - Puree Weak lingual manipulation;Lingual/palatal residue Oral - Mech Soft -- Oral - Regular Lingual/palatal residue Oral - Multi-Consistency -- Oral - Pill -- Oral Phase - Comment --  Pharyngeal Phase 01/22/2021 Pharyngeal Phase Impaired Pharyngeal- Pudding Teaspoon -- Pharyngeal -- Pharyngeal- Pudding Cup -- Pharyngeal -- Pharyngeal- Honey Teaspoon Reduced pharyngeal peristalsis;Reduced epiglottic inversion;Reduced anterior laryngeal mobility;Reduced airway/laryngeal closure;Reduced tongue base retraction;Pharyngeal residue - pyriform;Penetration/Aspiration before  swallow;Penetration/Apiration after swallow;Pharyngeal residue - valleculae Pharyngeal Material enters airway, remains ABOVE vocal cords and not ejected out Pharyngeal- Honey Cup Reduced pharyngeal peristalsis;Reduced epiglottic inversion;Reduced anterior laryngeal mobility;Reduced airway/laryngeal closure;Reduced tongue base retraction;Pharyngeal residue - pyriform;Penetration/Aspiration before swallow;Penetration/Apiration after swallow;Pharyngeal residue - valleculae Pharyngeal Material enters airway, passes BELOW cords without attempt by patient to eject out (silent aspiration) Pharyngeal- Nectar Teaspoon Reduced pharyngeal peristalsis;Reduced epiglottic inversion;Reduced anterior laryngeal mobility;Reduced airway/laryngeal closure;Reduced tongue base retraction;Pharyngeal residue - pyriform;Penetration/Aspiration before swallow;Penetration/Apiration after swallow;Pharyngeal residue - valleculae Pharyngeal Material enters airway, CONTACTS cords and not ejected out Pharyngeal- Nectar Cup Reduced pharyngeal  peristalsis;Reduced epiglottic inversion;Reduced anterior laryngeal mobility;Reduced airway/laryngeal closure;Reduced tongue base retraction;Pharyngeal residue - pyriform;Penetration/Aspiration before swallow;Penetration/Apiration after swallow;Pharyngeal residue - valleculae Pharyngeal Material enters airway, CONTACTS cords and not ejected out Pharyngeal- Nectar Straw -- Pharyngeal -- Pharyngeal- Thin Teaspoon -- Pharyngeal -- Pharyngeal- Thin Cup Reduced pharyngeal peristalsis;Reduced epiglottic inversion;Reduced anterior laryngeal mobility;Reduced airway/laryngeal closure;Reduced tongue base retraction;Pharyngeal residue - pyriform;Penetration/Aspiration before swallow;Penetration/Apiration after swallow Pharyngeal Material enters airway, passes BELOW cords without attempt by patient to eject out (silent aspiration) Pharyngeal- Thin Straw -- Pharyngeal -- Pharyngeal- Puree Reduced pharyngeal peristalsis;Reduced epiglottic inversion;Reduced anterior laryngeal mobility;Reduced airway/laryngeal closure;Reduced tongue base retraction;Pharyngeal residue - pyriform;Pharyngeal residue - valleculae Pharyngeal -- Pharyngeal- Mechanical Soft -- Pharyngeal -- Pharyngeal- Regular Reduced pharyngeal peristalsis;Reduced epiglottic inversion;Reduced anterior laryngeal mobility;Reduced airway/laryngeal closure;Reduced tongue base retraction;Pharyngeal residue - pyriform;Pharyngeal residue - valleculae Pharyngeal -- Pharyngeal- Multi-consistency -- Pharyngeal -- Pharyngeal- Pill -- Pharyngeal -- Pharyngeal Comment --  Cervical Esophageal Phase  01/22/2021 Cervical Esophageal Phase WFL Pudding Teaspoon -- Pudding Cup -- Honey Teaspoon -- Honey Cup -- Nectar Teaspoon -- Nectar Cup -- Nectar Straw -- Thin Teaspoon -- Thin Cup -- Thin Straw -- Puree -- Mechanical Soft -- Regular -- Multi-consistency -- Pill -- Cervical Esophageal Comment -- Osie Bond., M.A. [facility] Pager [phone] Office [phone]  01/22/2021, 4:49 PM                     EEG adult  Result Date: 01/15/2021 Lora Havens, MD     01/15/2021 11:27 AM Patient Name: Morgan Daniels MRN: [id number] Epilepsy Attending: Lora Havens Referring Physician/Provider: Georgann Housekeeper, NP Date: 01/15/2021 Duration: 23.31 mins Patient history: 60 year old female with altered mental status and seizure-like episodes. EEG to evaluate for seizure. Level of alertness: lethargic AEDs during EEG study: None Technical aspects: This EEG study was done with scalp electrodes positioned according to the 10-20 International system of electrode placement. Electrical activity was acquired at a sampling rate of 500Hz  and reviewed with a high frequency filter of 70Hz  and a low frequency filter of 1Hz . EEG data were recorded continuously and digitally stored. Description: EEG showed continuous generalized 3 to 6 Hz theta-delta slowing.  At around 1044, patient was noted to have right hand tremor lasting for few seconds. Concomitant EEG before, during and after the event did not show any EEG changes suggest seizure.  Hyperventilation and photic stimulation were not performed.   ABNORMALITY - Continuous slow, generalized IMPRESSION: This study is suggestive of moderate diffuse encephalopathy, nonspecific etiology.at around 1044, patient was noted to have right hand tremors lasting for few seconds without concomitant EEG change. This was most likely not an epileptic event.  No definite seizures or epileptiform discharges were seen throughout the recording. Lora Havens   ECHOCARDIOGRAM COMPLETE  Result Date: 01/13/2021    ECHOCARDIOGRAM REPORT   Patient Name:   Morgan Daniels Date of Exam: 01/13/2021 Medical Rec #:  [id number]    Height:       66.0 in Accession #:    [id number]   Weight:       202.4 lb Date of Birth:  [birth year]    BSA:          2.010 m Patient Age:    77 years     BP:           112/65 mmHg Patient Gender: F            HR:           114 bpm. Exam  Location:  [facility] Procedure: 2D Echo, Color Doppler and Cardiac Doppler Indications:     I42.9 Cardiomyopathy-unspecified  History:         Patient has no prior history of Echocardiogram examinations.                  CKD and Stroke; Risk Factors:Current Smoker, Hypertension and                  Diabetes.  Sonographer:     Charmayne Sheer Referring Phys:  [id number] [place]IT[place] L [place] Diagnosing Phys: Ida Rogue MD  Sonographer Comments: Echo performed with patient supine and on artificial respirator. IMPRESSIONS  1. Left ventricular ejection fraction, by estimation, is 60 to 65%. The left ventricle has normal function. The left ventricle has no regional wall motion abnormalities. Left ventricular diastolic parameters are consistent with Grade I diastolic dysfunction (impaired relaxation).  2. Right ventricular systolic function is normal. The right ventricular size is normal. Tricuspid regurgitation signal is inadequate for assessing PA pressure.  3. The mitral valve is normal in structure. Mild mitral valve regurgitation. No evidence of mitral stenosis.  4. The aortic valve is normal in structure. Aortic valve regurgitation is not visualized. Aortic valve sclerosis/calcification is present, without any evidence of aortic stenosis.  5. The inferior vena cava is dilated in size with >50% respiratory variability, suggesting right atrial pressure of 8 mmHg. FINDINGS  Left Ventricle: Left ventricular ejection fraction, by estimation, is 60 to 65%. The left ventricle has normal function. The left ventricle has no regional wall motion abnormalities. The left ventricular internal cavity size was normal in size. There is  borderline left ventricular hypertrophy. Left ventricular diastolic parameters are consistent with Grade I diastolic dysfunction (impaired relaxation). Right Ventricle: The right ventricular size is normal. No increase in right ventricular wall thickness. Right ventricular systolic function is normal.  Tricuspid regurgitation signal is inadequate for assessing PA pressure. Left Atrium: Left atrial size was normal in size. Right Atrium: Right atrial size was normal in size. Pericardium: There is no evidence of pericardial effusion. Mitral Valve: The mitral valve is normal in structure. Mild mitral valve regurgitation. No evidence of mitral valve stenosis. MV peak gradient, 9.1 mmHg. The mean mitral valve gradient is 5.0 mmHg. Tricuspid Valve: The tricuspid valve is normal in structure. Tricuspid valve regurgitation is mild . No evidence of tricuspid stenosis. Aortic Valve: The aortic valve is normal in structure. Aortic valve regurgitation is not visualized. Aortic valve sclerosis/calcification is present, without any evidence of aortic stenosis. Aortic valve mean gradient measures 9.0 mmHg. Aortic valve peak  gradient measures 14.9 mmHg. Aortic valve area, by VTI measures 2.60 cm. Pulmonic Valve: The pulmonic valve was normal in structure. Pulmonic valve regurgitation is not visualized. No evidence of pulmonic stenosis. Aorta: The aortic root is normal in size and structure. Venous: The inferior vena cava is dilated in size with  greater than 50% respiratory variability, suggesting right atrial pressure of 8 mmHg. IAS/Shunts: No atrial level shunt detected by color flow Doppler.  LEFT VENTRICLE PLAX 2D LVIDd:         5.40 cm   Diastology LVIDs:         3.80 cm   LV e' medial:    6.20 cm/s LV PW:         1.10 cm   LV E/e' medial:  17.4 LV IVS:        0.90 cm   LV e' lateral:   5.22 cm/s LVOT diam:     2.20 cm   LV E/e' lateral: 20.7 LV SV:         85 LV SV Index:   42 LVOT Area:     3.80 cm  RIGHT VENTRICLE RV Basal diam:  2.70 cm RV S prime:     22.60 cm/s LEFT ATRIUM           Index        RIGHT ATRIUM           Index LA diam:      4.20 cm 2.09 cm/m   RA Area:     13.80 cm LA Vol (A2C): 37.6 ml 18.71 ml/m  RA Volume:   33.90 ml  16.87 ml/m LA Vol (A4C): 37.3 ml 18.56 ml/m  AORTIC VALVE                      PULMONIC VALVE AV Area (Vmax):    2.42 cm      PV Vmax:       1.74 m/s AV Area (Vmean):   2.39 cm      PV Vmean:      111.000 cm/s AV Area (VTI):     2.60 cm      PV VTI:        0.281 m AV Vmax:           193.00 cm/s   PV Peak grad:  12.1 mmHg AV Vmean:          146.000 cm/s  PV Mean grad:  6.0 mmHg AV VTI:            0.327 m AV Peak Grad:      14.9 mmHg AV Mean Grad:      9.0 mmHg LVOT Vmax:         123.00 cm/s LVOT Vmean:        91.900 cm/s LVOT VTI:          0.224 m LVOT/AV VTI ratio: 0.69  AORTA Ao Root diam: 3.20 cm MITRAL VALVE MV Area (PHT): 7.29 cm     SHUNTS MV Area VTI:   3.45 cm     Systemic VTI:  0.22 m MV Peak grad:  9.1 mmHg     Systemic Diam: 2.20 cm MV Mean grad:  5.0 mmHg MV Vmax:       1.51 m/s MV Vmean:      105.0 cm/s MV Decel Time: 104 msec MV E velocity: 108.00 cm/s MV A velocity: 121.00 cm/s MV E/A ratio:  0.89 Ida Rogue MD Electronically signed by Ida Rogue MD Signature Date/Time: 01/13/2021/1:42:11 PM    Final    VAS US DOPPLER PRE CABG  Result Date: 01/15/2021 PREOPERATIVE VASCULAR EVALUATION Patient Name:  Morgan Daniels  Date of Exam:   01/15/2021 Medical Rec #: [id number]     Accession #:    [id number]  Date of Birth: [birth year]     Patient Gender: F Patient Age:   86 years Exam Location:  [facility] Procedure:      VAS US DOPPLER PRE CABG Referring Phys: STEVEN HENDRICKSON --------------------------------------------------------------------------------  Indications:      Pre-CABG. Risk Factors:     None. Limitations:      restricted right arm, restricted left arm, patient immobility,                   patient positioning, lines, bandages, Intra-aortic balloon                   pump in place Comparison Study: No prior studies. Performing Technologist: Carlos Levering RVT  Examination Guidelines: A complete evaluation includes B-mode imaging, spectral Doppler, color Doppler, and power Doppler as needed of all accessible portions of each vessel. Bilateral testing is  considered an integral part of a complete examination. Limited examinations for reoccurring indications may be performed as noted.  Right Carotid Findings: +----------+--------+--------+--------+-----------------------+--------+             PSV cm/s EDV cm/s Stenosis Describe                Comments  +----------+--------+--------+--------+-----------------------+--------+  CCA Prox                              smooth and heterogenous           +----------+--------+--------+--------+-----------------------+--------+  CCA Distal                            smooth and heterogenous           +----------+--------+--------+--------+-----------------------+--------+  ICA Prox                              smooth and heterogenous           +----------+--------+--------+--------+-----------------------+--------+  Left Carotid Findings: +----------+--------+--------+--------+-----------------------+--------+             PSV cm/s EDV cm/s Stenosis Describe                Comments  +----------+--------+--------+--------+-----------------------+--------+  CCA Prox                              smooth and heterogenous           +----------+--------+--------+--------+-----------------------+--------+  CCA Distal                            smooth and heterogenous           +----------+--------+--------+--------+-----------------------+--------+  ICA Prox                              smooth and heterogenous           +----------+--------+--------+--------+-----------------------+--------+  ABI Findings: +--------+------------------+-----+--------+-------------------+  Right    Rt Pressure (mmHg) Index Waveform Comment              +--------+------------------+-----+--------+-------------------+  Brachial                                   Restricted arm       +--------+------------------+-----+--------+-------------------+  PTA  Unable to visualize   +--------+------------------+-----+--------+-------------------+  DP       94                 0.65           Balloon pump         +--------+------------------+-----+--------+-------------------+ +--------+------------------+-----+--------+--------------+  Left     Lt Pressure (mmHg) Index Waveform Comment         +--------+------------------+-----+--------+--------------+  Brachial 144                               Balloon pump    +--------+------------------+-----+--------+--------------+  PTA                                        Restricted leg  +--------+------------------+-----+--------+--------------+  DP                                         Restricted leg  +--------+------------------+-----+--------+--------------+  Right Doppler Findings: +-----------+--------+-----+-------+--------------+  Site        Pressure Index Doppler Comments        +-----------+--------+-----+-------+--------------+  Brachial                           Restricted arm  +-----------+--------+-----+-------+--------------+  Radial                             Restricted arm  +-----------+--------+-----+-------+--------------+  Ulnar                              Restricted arm  +-----------+--------+-----+-------+--------------+  Palmar Arch                        Restricted arm  +-----------+--------+-----+-------+--------------+  Left Doppler Findings: +--------+--------+-----+-------+------------+  Site     Pressure Index Doppler Comments      +--------+--------+-----+-------+------------+  Brachial 144                    Balloon pump  +--------+--------+-----+-------+------------+  Radial                          Balloon pump  +--------+--------+-----+-------+------------+  Ulnar                           Balloon pump  +--------+--------+-----+-------+------------+  Summary: Right Carotid: Unable to accurately measure waveforms due to the presence of a                balloon pump. Left Carotid: Unable to accurately measure waveforms  due to the presence of a               balloon pump. Right ABI: Resting right ankle-brachial index indicates moderate right lower extremity arterial disease. Unable to assess waveforms due to the presence of a balloon pump. Left ABI: Unable to assess waveforms due to the presence of a balloon pump. Left Upper Extremity: Doppler waveform obliterate with left radial compression. Doppler waveforms decrease >50% with left ulnar compression.  Electronically signed by Harold Barban MD on 01/15/2021  at 7:03:16 PM.    Final    [place] EKG SITE RITE  Result Date: 01/14/2021 If Site Rite image not attached, placement could not be confirmed due to current cardiac rhythm.   Labs:  Basic Metabolic Panel: Recent Labs  Lab 01/25/21 1530 01/27/21 0328 01/28/21 0321  NA  --  140 138  K  --  3.9 4.0  CL  --  99 98  CO2  --  35* 35*  GLUCOSE  --  210* 242*  BUN  --  11 13  CREATININE 0.64 0.51 0.72  CALCIUM  --  8.6* 8.4*    CBC: Recent Labs  Lab 01/27/21 0328 01/28/21 0321 01/29/21 0444  WBC 11.9* 12.4* 11.9*  NEUTROABS 7.5 7.8* 7.3  HGB 9.2* 9.2* 8.8*  HCT 29.6* 30.7* 28.8*  MCV 87.6 89.5 89.2  PLT 423* 447* 385    CBG: Recent Labs  Lab 01/30/21 2117 01/31/21 0631 01/31/21 1145 01/31/21 1616 01/31/21 2101  GLUCAP 181* 142* 165* 136* 148*   Family history.  Positive for hypertension as well as hyperlipidemia.  Denies any colon cancer esophageal cancer or rectal cancer  Brief HPI:   Morgan Daniels is a 60 y.o. right-handed female who presented to [facility] 01/12/2021 with acute respiratory failure and unresponsive.  She was admitted to critical care requiring intubation.  Further work-up revealed ST depressions in V leads and EKG serial troponins approximately 3000.  Heparin infusion initiated for non-ST elevation MI.  Cardiology consulted echo cardiography performed revealed estimated ejection fraction 60 to 65%.  She was taken to cardiac Cath Lab underwent cardiac  catheterization showing severe three-vessel disease.  Intra-aortic balloon pump was placed.  She was transferred to [facility] for further evaluation.  Dr. Roxan Hockey consulted underwent CABG 01/18/2021.  Patient mains intubated.  Illness complicated by hypoxic respiratory failure as well as pneumonia.  Antibiotic course completed.  She self extubated postoperative day 2.  Remained n.p.o. with nasogastric tube feeds follow-up speech therapy.  Maintained on Plavix and aspirin.  Lovenox added for DVT prophylaxis.  Due to patient decreased functional mobility was admitted for a comprehensive rehab program.   Hospital Course: Morgan Daniels was admitted to rehab 01/25/2021 for inpatient therapies to consist of PT, ST and OT at least three hours five days a week. Past admission physiatrist, therapy team and rehab RN have worked together to provide customized collaborative inpatient rehab.  Pertaining to patient's CAD with CABG/debility she was attending full therapies sternal precautions surgical site healing nicely no chest pain or shortness of breath.  Lovenox for DVT prophylaxis.  She will continue aspirin and Plavix therapy.  Pain managed with use of Neurontin wean from oxycodone.  Blood pressure controlled and monitored on Norvasc Lopressor.  Lipitor ongoing for hyperlipidemia.  Diabetes mellitus insulin regimen as discussed diabetic teaching and would continue [place] at home.  COPD tobacco use continue inhalers receiving counts regards cessation of nicotine products.  Acute blood loss anemia stable no bleeding episodes and latest hemoglobin 8.8.[name]  Currently on dysphagia #2 thin liquid diet.  Noted UTI completing course of Keflex no dysuria or hematuria.   Blood pressures were monitored on TID basis and soft and monitored  Diabetes has been monitored with ac/hs CBG checks and SSI was use prn for tighter BS control.    Rehab course: During patient's stay in rehab weekly team conferences were held to  monitor patient's progress, set goals and discuss barriers to discharge. At admission, patient required  minimal assist 15 feet Rollator minimal assist sit to stand  Physical exam.  Blood pressure 148/70 pulse 86 temperature 98.6 respirations 14 oxygen saturation 93% room air Constitutional.  No acute distress HEENT Head.  Normocephalic and atraumatic Eyes.  Pupils round and reactive to light no discharge.nystagmus Neck.  Supple nontender no JVD without thyromegaly Cardiac regular rate and rhythm any extra sounds or murmur heard Abdomen.  Soft nontender positive bowel sounds without rebound Respiratory effort normal no respiratory distress without wheeze Skin.  Midline chest incision clean and dry Neurologic.  Alert sitting up in bed provides name and age.  Oriented x3 fair insight and awareness.  Intact memory.  Cranial nerves exam unremarkable.  Upper extremities 4/5 proximal and distal lower extremities 3/5 hip flexors 4 -/5 knee extension 4/5 ankle dorsi plantarflexion.  Sensation intact light touch  He/She  has had improvement in activity tolerance, balance, postural control as well as ability to compensate for deficits. He/She has had improvement in functional use RUE/LUE  and RLE/LLE as well as improvement in awareness.  Wheelchair stand pivot without assistive device contact-guard.  Ambulates 180 feet without assistive device contact-guard.  Working with energy conservation techniques.  Patient able to get out of bed without wide stretch nonadherence sternal precautions with education ongoing.  She was able to gather belongings for ADLs.  Speech therapy follow-up for dysphagia and diet restrictions.  Full family teaching completed plan discharged to home.       Disposition: Discharged home   Diet: Dysphagia #2 thin liquids  Special Instructions: No driving smoking or alcohol  Sternal precautions  Medications at discharge 1.  Amiodarone 200 mg p.o. daily 2.  Norvasc 10 mg p.o.  daily 3.  Lipitor 80 mg p.o. daily 4.  Keflex 500 mg every 12 hours x4 days and stop 5.  Plavix and 5 mg p.o. daily 6.  Prozac 80 mg p.o. daily 7.  Neurontin 600 mg p.o. 3 times daily 8.  NovoLog 6 units every 4 hours 9.  Tresiba 40 units daily 10.  Lopressor 25 mg twice daily 11.  NicoDerm patch taper as directed 12.  Oxycodone 5  mg every 4 hours as needed pain 13.  Protonix 40 mg p.o. daily 14.  Florastor 250 mg p.o. twice daily 15.  Ellipta 62.5-25 1 puff daily 16.  Aspirin 81 mg daily 17.MVI daily  30-35 minutes were spent completing discharge summary and discharge planning  Discharge Instructions     Ambulatory referral to Physical Medicine Rehab   Complete by: As directed    Moderate complexity follow-up 1 to 2 weeks debility/CABG        Follow-up Information     Raulkar, Clide Deutscher, MD Follow up.   Specialty: Physical Medicine and Rehabilitation Why: 04/16/21 please arrive at 1:40pm for 2:00pm follow-up, thank you! Contact information: [address] [place] [address] [phone]         [name], MD Follow up.   Specialty: Cardiothoracic Surgery Why: Call for appointment Contact information: [place] [address] [phone]         Vickie Epley, MD Follow up.   Specialties: Cardiology, Radiology Why: Call for appointment Contact information: [place] [address] [phone]                 Signed: Lavon Paganini [place] 02/01/2021, 5:14 AM

## 2021-01-30 NOTE — Progress Notes (Signed)
Physical Therapy Session Note  Patient Details  Name: Morgan Daniels MRN: [id number] Date of Birth: Jun 19, 1960  Today's Date: 01/30/2021 PT Individual Time: 7741-2878 PT Individual Time Calculation (min): 54 min   Short Term Goals: Week 1:  PT Short Term Goal 1 (Week 1): STG = LTG due to ELOS  Skilled Therapeutic Interventions/Progress Updates:   Received pt supine in bed, pt agreeable to PT treatment, and denied any pain during session. Session with emphasis on functional mobility, dynamic standing balance, simulated car transfers, gait training, adherence to sternal precautions, and improved endurance with activity. Pt reported urge to void and transferred supine<>sitting EOB with supervision. Pt ambulated 81ft x 2 trials without AD and CGA to/from bathroom with total A to manage O2 tank. Pt able to manage clothing with supervision and void and perform peri-care mod I. Pt with continued impulsivity, standing up and walking across bathroom to get wipes with pants around knees. Pt stood at sink and washed hands with supervision. Pt requested to take O2 off; used this opportunity as a chance for education. Removed O2 and sats dropped to 68% at rest, donned O2 and sats increased back to 93% on 3L within 2 minutes. Educated pt on critical importance of wearing O2 to avoid hypoxia as pt initially stated she didn't need any. Pt transported to/from room in [facility] total A for time management purposes. Pt performed ambulatory simulated car transfer with RW and supervision and ambulated 43ft on uneven surfaces (ramp) with RW and supervision. Sit<>stands without AD and CGA/supervision and pt ambulated 139ft x 2 trials without AD and CGA. O2 sat dropped to 85% but increased to 93% within 2 minutes of seated rest. Pt demonstrates improved stability with gait and less distracted overall. Educated pt on importance of using RW initially upon D/C for safety and energy conservation and pt verbalized understanding. Pt  performed seated BLE strengthening on Kinetron at 20 cm/sec for 1 minute increasing to 10 cm/sec for 1 minute x 3 additional trials with emphasis on glute/quad strengthening - cues for anterior weight shifting and to avoid pulling with UE's. Pt requested to return to bed, stand<>pivot without AD and CGA and sit<>supine with supervision. Concluded session with pt supine in bed with HOB >30 degrees, needs within reach, and bed alarm on. Pt left on 3L O2 via [place].   Therapy Documentation Precautions:  Precautions Precautions: Fall, Sternal, Other (comment) (Cortrak) Precaution Comments: monitor O2 (does not wear at baseline) Restrictions Weight Bearing Restrictions: No RLE Weight Bearing: Weight bearing as tolerated LLE Weight Bearing: Weight bearing as tolerated Other Position/Activity Restrictions: sternal precautions  Therapy/Group: Individual Therapy Alfonse Alpers PT, DPT   01/30/2021, 7:27 AM

## 2021-01-30 NOTE — Patient Care Conference (Signed)
Inpatient RehabilitationTeam Conference and Plan of Care Update Date: 01/30/2021   Time: 11:30 AM    Patient Name: Morgan Daniels      Medical Record Number: [id number]  Date of Birth: 05/03/1960 Sex: Female         Room/Bed: 4M01C/4M01C-01 Payor Info: Payor: MEDICARE / Plan: MEDICARE PART A AND B / Product Type: *No Product type* /    Admit Date/Time:  01/25/2021  2:29 PM  Primary Diagnosis:  [facility] Problems: Principal Problem:   Debility    Expected Discharge Date: Expected Discharge Date: 02/01/21  Team Members Present: Physician leading conference: Dr. Leeroy Cha Social Worker Present: Erlene Quan, BSW Nurse Present: Dorien Chihuahua, RN PT Present: Becky Sax, PT OT Present: Other (comment) [facility] Alphonsa Gin, OT) SLP Present: Lillie Columbia, SLP PPS Coordinator present : Gunnar Fusi, SLP     Current Status/Progress Goal Weekly Team Focus  Bowel/Bladder   cont of b/b. occasional unabilty to urinate( in/out ) last BM-12/13  remain cont.and be able to urinate all the time.  assess  q shift and PRN   Swallow/Nutrition/ Hydration   Upgraded to Dys2/thin no straw following MBS 12/14  Supervision for safest diet  tolerating Dys 2/thin   ADL's   Mod A LB bathing/dressing, Min A UB dressing- mainly for managing the lines, min A toileting, CGA functional transfers using RW  Supervision  adherence to sternal precautions, LB bathing/dressing, activity tolerance   Mobility   bed mobility supervision, transfers with RW CGA and without AD min A, gait 145ft with RW CGA and without AD min A - poor adherance to sternal precautions and to wearing O2  supervision  adherance to sternal precautions, functional mobility, generalized strengthening, dynamic standing balance/coordination, gait training, attention/awareness, education, and D/C planning   Communication             Safety/Cognition/ Behavioral Observations  min-SPV - very impulsive, poor carryover  SPV -  Mod I  carryover of recommendations, awareness   Pain   pain to surgical site, oxycodone is effective  pain<3  assess pain q shift and PRN   Skin   surgical incision to mid chest and wound/puncture to abdomen from s/p chest tube removal  proper healing, no signs and symptoms of infection  assess skin q shift and PRN     Discharge Planning:  D/c home, spouse taking FMLA & daughter able to assist. 24/7 available   Team Discussion: Patient with UTI; treated per MD. Patient has selective attention and is easily distracted. Poor compliance with sternal precautions post CABG and poor safety awareness and carry over.   Patient on target to meet rehab goals: yes, currently needs supervision without an assistive device for transfers. Requires CGA for transfers and able to ambulate up to 150'. Needs min assist for cognition with supervision goals for discharge.  *See Care Plan and progress notes for long and short-term goals.   Revisions to Treatment Plan:  MBS 01/30/21; upgraded to D2 diet Discontinue PICC and cortrak   Teaching Needs: Safety, dietary modifications, medication management,   Current Barriers to Discharge: Decreased caregiver support  Possible Resolutions to Barriers: Family education HH follow up services DME: walker; has a shower chair and [facility]     Medical Summary Current Status: sternal wound, upgraded from NPO to D2 diet, easily distracted, dysuria  Barriers to Discharge: Medical stability;Weight;Wound care;Behavior;Nutrition means  Barriers to Discharge Comments: sternal wound, upgraded from NPO to D2 diet, easily distracted, dysuria Possible Resolutions to  Barriers/Weekly Focus: continue to educated regarding sternal precautions, decrease oxycodone to q4H prn, advance to D2 die, contiune keflex/start diflucan/start probiotic   Continued Need for Acute Rehabilitation Level of Care: The patient requires daily medical management by a physician with specialized  training in physical medicine and rehabilitation for the following reasons: Direction of a multidisciplinary physical rehabilitation program to maximize functional independence : Yes Medical management of patient stability for increased activity during participation in an intensive rehabilitation regime.: Yes Analysis of laboratory values and/or radiology reports with any subsequent need for medication adjustment and/or medical intervention. : Yes   I attest that I was present, lead the team conference, and concur with the assessment and plan of the team.   Dorien Chihuahua B 01/30/2021, 3:36 PM

## 2021-01-30 NOTE — Progress Notes (Signed)
Modified Barium Swallow Progress Note  Patient Details  Name: Morgan Daniels MRN: [id number] Date of Birth: March 08, 1960  Today's Date: 01/30/2021  Modified Barium Swallow completed.  Full report located under Chart Review in the Imaging Section.  Brief recommendations include the following:  Clinical Impression  Patients overall swallow function appears improved since initial MBS but patient continues to demonstrate a mild oropharyngeal dysphagia. Oral phase is characterized by prolonged mastication with solid textures.  Pharyngeal phase is characterized by reduced pharyngeal squeeze and vestibular closure resulting in mostly flash penetration of liquids with trace amounts of penetrates that did clear after completion of the swallow intermittently.  Mild residue noted throughout the pharynx that was reduced with spontaneous use of multiple swallows. Recommend patient initiate a diet of Dys. 2 textures with thin liquids. Patient and husband verbalized understanding with a handout given regarding appropriate food textures.   Swallow Evaluation Recommendations       SLP Diet Recommendations: Dysphagia 2 (Fine chop) solids;Thin liquid   Liquid Administration via: Cup   Medication Administration: Crushed with puree   Supervision: Full supervision/cueing for compensatory strategies;Patient able to self feed   Compensations: Slow rate;Small sips/bites;Effortful swallow;Multiple dry swallows after each bite/sip   Postural Changes: Seated upright at 90 degrees   Oral Care Recommendations: Oral care BID        Hikeem Andersson 01/30/2021,3:30 PM

## 2021-01-30 NOTE — Progress Notes (Signed)
Occupational Therapy Session Note  Patient Details  Name: NEZZIE MANERA MRN: [id number] Date of Birth: Feb 14, 1961  Today's Date: 01/30/2021 OT Individual Time: 0805-0900 OT Individual Time Calculation (min): 55 min    Short Term Goals: Week 1:  OT Short Term Goal 1 (Week 1): STGs = LTGs  Skilled Therapeutic Interventions/Progress Updates:  Skilled OT intervention completed with focus on AE education, d/c planning, re-education on sternal precautions and bed mobility. Pt received supine in bed with LPN, disconnecting pt from coretrak, and administering meds. Discussion on rehab goals, POC and AE education provided. Pt with impression that she is leaving today, with education provided about need for results with swallow test as well as care team meeting to determine safe d/c date. Pt request to complete bed mobility- per pt was unable to get out of bed without wide stretch/non-adherence to sternal precautions. Mass practice bed mobility from bed flat to seated EOB via roll for increased independence with pt needing cues for hand placement but able to complete with supervision. While seated EOB, completed AE education with pt return demonstrating use of reacher to thread/donn pants with supervision/cues, and sock aid with supervision/cues. RN in room to remove PICC line, with pt needing to lay flat for 30 mins following for safety. Pt completed bed mobility with supervision for EOB > supine. Husband in room with questions about pt's d/c date and where to get AE, with education provided on pt's CLOF and need for passing the swallow test before home but physically is doing well, as well as showing husband on computer where to purchase/price points for AE used in session. Pt left with HOB >30 degrees in bed, with bed alarm on, husband and MD at pt's bedside at therapist departure.  Therapy Documentation Precautions:  Precautions Precautions: Fall, Sternal, Other (comment) (Cortrak) Precaution  Comments: monitor O2 (does not wear at baseline) Restrictions Weight Bearing Restrictions: No RLE Weight Bearing: Weight bearing as tolerated LLE Weight Bearing: Weight bearing as tolerated Other Position/Activity Restrictions: sternal precautions  Pain: No c/o pain   Therapy/Group: Individual Therapy  Warrick Llera E Kyleeann Cremeans 01/30/2021, 7:30 AM

## 2021-01-30 NOTE — Progress Notes (Signed)
[facility] Individual Statement of Services  Patient Name:  Morgan Daniels  Date:  01/30/2021  Welcome to the [facility].  Our goal is to provide you with an individualized program based on your diagnosis and situation, designed to meet your specific needs.  With this comprehensive rehabilitation program, you will be expected to participate in at least 3 hours of rehabilitation therapies Monday-Friday, with modified therapy programming on the weekends.  Your rehabilitation program will include the following services:  Physical Therapy (PT), Occupational Therapy (OT), Speech Therapy (ST), 24 hour per day rehabilitation nursing, Therapeutic Recreaction (TR), Neuropsychology, Care Coordinator, Rehabilitation Medicine, Nutrition Services, Pharmacy Services, and Other  Weekly team conferences will be held on Wednesdays to discuss your progress.  Your Inpatient Rehabilitation Care Coordinator will talk with you frequently to get your input and to update you on team discussions.  Team conferences with you and your family in attendance may also be held.  Expected length of stay: 7-10 Days  Overall anticipated outcome:  MOD I to Supervision  Depending on your progress and recovery, your program may change. Your Inpatient Rehabilitation Care Coordinator will coordinate services and will keep you informed of any changes. Your Inpatient Rehabilitation Care Coordinator's name and contact numbers are listed  below.  The following services may also be recommended but are not provided by the [facility]:   [place] will be made to provide these services after discharge if needed.  Arrangements include referral to agencies that provide these services.  Your insurance has been verified to be:  Medicare Your primary doctor is:  PCP NOT IN SYSTEM  Pertinent information  will be shared with your doctor and your insurance company.  Inpatient Rehabilitation Care Coordinator:  Erlene Quan, [place] or [phone]  Information discussed with and copy given to patient by: [name], 01/30/2021, 9:33 AM

## 2021-01-30 NOTE — Progress Notes (Signed)
Patient ID: Morgan Daniels, female   DOB: 01/17/1961, 60 y.o.   MRN: [id number] Team Conference Report to Patient/Family  Team Conference discussion was reviewed with the patient and caregiver, including goals, any changes in plan of care and target discharge date.  Patient and caregiver express understanding and are in agreement.  The patient has a target discharge date of 02/01/21.  SW met with patient and spouse. Spouse set with d/c date. Pt initially decline 02, but agreed to d/c with 02. Patient requesting to see neuro psych on the OP basis. No additional questions or concerns.   [name] 01/30/2021, 2:53 PM

## 2021-01-30 NOTE — Progress Notes (Signed)
PROGRESS NOTE   Subjective/Complaints: D/c PICC today As per husband patient wants to leave today Discussed her UTI- keflex started yesterday, she says she often gets yeast infections with abx  ROS:  Pt denies SOB, abd pain, CP, N/V/C/D, and vision changes, +sternal pain, +dysuria   Objective:   No results found. Recent Labs    01/28/21 0321 01/29/21 0444  WBC 12.4* 11.9*  HGB 9.2* 8.8*  HCT 30.7* 28.8*  PLT 447* 385   Recent Labs    01/28/21 0321  NA 138  K 4.0  CL 98  CO2 35*  GLUCOSE 242*  BUN 13  CREATININE 0.72  CALCIUM 8.4*    Intake/Output Summary (Last 24 hours) at 01/30/2021 1059 Last data filed at 01/30/2021 0331 Gross per 24 hour  Intake --  Output 530 ml  Net -530 ml        Physical Exam: Vital Signs Blood pressure (!) 113/91, pulse 87, temperature 98.7 F (37.1 C), temperature source Oral, resp. rate 20, height 5\' 7"  (1.702 m), weight 89.4 kg, SpO2 90 %. General: awake, alert, appropriate, somewhat irritable, BMI 30.66 HENT: conjugate gaze; oropharynx moist; NGT and O2 in place CV: regular rate; no JVD,  Pulmonary: CTA B/L; no W/R/R- good air movement, desat to 89% GI: soft, NT, ND, (+)BS Psychiatric: irritable Neurological: alert  Musculoskeletal:        General: Tenderness (chest wall tender) present. Normal range of motion.     Cervical back: Normal range of motion. Tr  LE edema Skin:    Comments: Midline chest incision clean and dry.  Neuro:Normal insight and awareness. Intact Memory. Normal language and speech. Strong voice, no dysarthria. Cranial nerve exam unremarkable. UE 4/5 prox to distal. LE: 3/5 HF, 4-/5 KE, 4/5 ADF/PF. Sensory exam normal for light touch and pain in all 4 limbs. No limb ataxia or cerebellar signs. No abnormal tone appreciated.   Assessment/Plan: 1. Functional deficits which require 3+ hours per day of interdisciplinary therapy in a comprehensive  inpatient rehab setting. Physiatrist is providing close team supervision and 24 hour management of active medical problems listed below. Physiatrist and rehab team continue to assess barriers to discharge/monitor patient progress toward functional and medical goals  Care Tool:  Bathing    Body parts bathed by patient: Right arm, Left arm, Chest, Abdomen, Face   Body parts bathed by helper: Buttocks     Bathing assist Assist Level: Supervision/Verbal cueing     Upper Body Dressing/Undressing Upper body dressing   What is the patient wearing?: Pull over shirt    Upper body assist Assist Level: Minimal Assistance - Patient > 75%    Lower Body Dressing/Undressing Lower body dressing      What is the patient wearing?: Pants     Lower body assist Assist for lower body dressing: Supervision/Verbal cueing     Toileting Toileting    Toileting assist Assist for toileting: Minimal Assistance - Patient > 75%     Transfers Chair/bed transfer  Transfers assist     Chair/bed transfer assist level: Contact Guard/Touching assist     Locomotion Ambulation   Ambulation assist      Assist level: Contact  Guard/Touching assist Assistive device: No Device Max distance: 125ft   Walk 10 feet activity   Assist     Assist level: Contact Guard/Touching assist Assistive device: No Device   Walk 50 feet activity   Assist    Assist level: Contact Guard/Touching assist Assistive device: No Device    Walk 150 feet activity   Assist Walk 150 feet activity did not occur: Safety/medical concerns (Impulsiveness, fatigue)  Assist level: Contact Guard/Touching assist Assistive device: No Device    Walk 10 feet on uneven surface  activity   Assist Walk 10 feet on uneven surfaces activity did not occur: Safety/medical concerns (Impulsiveness, fatigue)         Wheelchair     Assist Is the patient using a wheelchair?: Yes Type of Wheelchair: Manual     Wheelchair assist level: Dependent - Patient 0%      Wheelchair 50 feet with 2 turns activity    Assist        Assist Level: Dependent - Patient 0%   Wheelchair 150 feet activity     Assist      Assist Level: Dependent - Patient 0%   Blood pressure (!) 113/91, pulse 87, temperature 98.7 F (37.1 C), temperature source Oral, resp. rate 20, height 5\' 7"  (1.702 m), weight 89.4 kg, SpO2 90 %.  Medical Problem List and Plan: 1.  Debility functional deficits secondary to CAD with CABG 01/18/2021/[facility]             -patient may shower             -ELOS/Goals: 7-10 days, mod I to supervision PT, OT, SLP  Continue CIR- PT, OT and SLP  -Interdisciplinary Team Conference today   2. Impaired mobility, ambulating 75 feet: continue Lovenox 40 mg QD             -antiplatelet therapy: Plavix 75 mg daily and aspirin 81 mg QD 3. Pain Management: decrease oxycodone IR 5/10 mg to every 4 hours PRN, Neurontin 600 mg TID  12/10- pain controlled- con't regimen 4. Mood: continue Prozac, LCSW to provide emotional support             -antipsychotic agents: n/a             -nicotine patch ordered for nicotine craving 14mg   12/11- pt wants to leave- likely due to nicotine cravings- unable to leave on pass 5. Neuropsych: This patient is capable of making decisions on her own behalf. 6. Skin/Wound Care: sternal wound cdi, can get wet.  7. Fluids/Electrolytes/Nutrition: routine I/Os and follow-up chemistries 8. Hypertension: BP stable, rate controlled, continue Pacerone, Norvasc, Lopressor             -bp controlled at present  12/11- BP controlled 130/70 this AM- con't regimen 9.Hyperlipidemia: continue Lipitor 10. Diabetes mellitus type 2: continue insuline regimen and CBG checks             -12/10- CBGs adequate control- con't regimen 11. COPD: continue Anoro Ellipta (prescribe at discharge) 12. Tobacco use: provide nicotine patch, smoking cessation counseling 13. PUD/GERD:  continue PPI 14.  Acute blood loss anemia/leukocytosis.  Follow-up chemistries             -no signs of blood loss on exam 15.  Dysphagia.   D/c Cortrak as per SLP  Provided education about infection risk if she continues to consume foods brought by family.  16. Foley             -  has been in for diuresis             -recent hematuria d/t foley trauma             -remove foley in AM tomorrow and begin voiding trial.  12/10- remove foley- and cath if volumes >350cc- ordered bladder scans.    - foley was removed before pt came over- got call from nurse at 1:53 pm- having hematuria again- will check labs in AM to make sure not large amount of blood loss- don't want to place foley again due to recent hx of trauma FROM a foley- and see if can go without placing- if it gets worse, will call Urology.   12/11- Hb 9.2 from 9.7- will recheck again in AM and if still drops, will need to call Urology.  17. Resp failure- on O2  12/11- will wean O2 to try and get off O2. However does have COPD, so will see.  18. Obesity BMI 30.66: provide dietary education 19. Leukocytosis: ttrending down, possibly 2/2 UTI: treat as below.  20. UTI: Keflex 500mg  BID ordered 21. Disposition: HFU scheduled       LOS: 5 days A FACE TO FACE EVALUATION WAS PERFORMED  Cloria Ciresi P Jeannia Tatro 01/30/2021, 10:59 AM

## 2021-01-30 NOTE — Progress Notes (Signed)
Inpatient Rehabilitation Care Coordinator Assessment and Plan Patient Details  Name: Morgan Daniels MRN: [id number] Date of Birth: September 10, 1960  Today's Date: 01/30/2021  Hospital Problems: Principal Problem:   Debility  Past Medical History:  Past Medical History:  Diagnosis Date   Anxiety    Arthritis    Bilateral carotid artery stenosis 2014   Carotid artery occlusion    Chronic kidney disease May 2017   UTI   Chronic kidney disease 2017   Current smoker    CVA (cerebral vascular accident) ([place]) 2013   Depression    Diabetes ([place])    Diverticulosis    Fatty liver    Fibromyalgia    GERD (gastroesophageal reflux disease)    H/O hiatal hernia    Hiatal hernia    Hypercholesteremia    Hypertension    IBS (irritable bowel syndrome)    PAD (peripheral artery disease) ([place])    Peptic ulcer    Plantar fasciitis    Stroke [facility]) Dec. 14,2013   Right side-ministroke   T2DM (type 2 diabetes mellitus) ([place])    Past Surgical History:  Past Surgical History:  Procedure Laterality Date   ABDOMINAL HYSTERECTOMY  1990   BACK SURGERY  2000, 2004   CAROTID ENDARTERECTOMY Left 05/06/12   CARPAL TUNNEL RELEASE Left 07/18/2015   Procedure: CARPAL TUNNEL RELEASE;  Surgeon: Earnestine Leys, MD;  Location: [facility] ORS;  Service: Orthopedics;  Laterality: Left;   CEREBRAL ANGIOGRAM Bilateral 05/03/2012   Procedure: CEREBRAL ANGIOGRAM;  Surgeon: Angelia Mould, MD;  [facility]: [facility] CATH LAB;  Service: Cardiovascular;  Laterality: Bilateral;   CHOLECYSTECTOMY  2001   COLONOSCOPY WITH PROPOFOL N/A 11/19/2018   Procedure: COLONOSCOPY WITH PROPOFOL;  Surgeon: Lucilla Lame, MD;  Location: [place];  Service: Endoscopy;  Laterality: N/A;  Diabetic - insulin   CORONARY ARTERY BYPASS GRAFT N/A 01/18/2021   Procedure: CORONARY ARTERY BYPASS GRAFTING (CABG) x 4  USING LEFT INTERNAL MAMMARY ARTERY AND LEFT ENDOSCOPIC GREATER SAPHENOUS VEIN CONDUITS;  Surgeon: [name], MD;   Location: [place];  Service: Open Heart Surgery;  Laterality: N/A;   CORONARY/GRAFT ACUTE MI REVASCULARIZATION N/A 01/13/2021   Procedure: Coronary/Graft Acute MI Revascularization;  Surgeon: Isaias Cowman, MD;  Location: [place] CV LAB;  Service: Cardiovascular;  Laterality: N/A;   ENDARTERECTOMY Left 05/06/2012   Procedure: ENDARTERECTOMY CAROTID;  Surgeon: Angelia Mould, MD;  Location: [place];  Service: Vascular;  Laterality: Left;   ENDARTERECTOMY Right 08/09/2013   Procedure: ENDARTERECTOMY CAROTID-RIGHT;  Surgeon: Angelia Mould, MD;  Location: [place];  Service: Vascular;  Laterality: Right;   ENDOVEIN HARVEST OF GREATER SAPHENOUS VEIN Left 01/18/2021   Procedure: ENDOVEIN HARVEST OF GREATER SAPHENOUS VEIN;  Surgeon: [name], MD;  Location: [place];  Service: Open Heart Surgery;  Laterality: Left;   HERNIA REPAIR     IABP INSERTION Right 01/13/2021   Procedure: IABP Insertion;  Surgeon: Isaias Cowman, MD;  Location: [place] CV LAB;  Service: Cardiovascular;  Laterality: Right;   LEFT HEART CATH AND CORONARY ANGIOGRAPHY N/A 01/13/2021   Procedure: LEFT HEART CATH AND CORONARY ANGIOGRAPHY;  Surgeon: Isaias Cowman, MD;  Location: [place] CV LAB;  Service: Cardiovascular;  Laterality: N/A;   LOWER EXTREMITY ANGIOGRAPHY Left 01/03/2021   Procedure: LOWER EXTREMITY ANGIOGRAPHY;  Surgeon: Algernon Huxley, MD;  Location: [place] CV LAB;  Service: Cardiovascular;  Laterality: Left;   PATCH ANGIOPLASTY Left 05/06/2012   Procedure: WITH DACRON PATCH ANGIOPLASTY ;  Surgeon: Harrell Gave  Nicole Cella, MD;  Location: [place];  Service: Vascular;  Laterality: Left;   POLYPECTOMY  11/19/2018   Procedure: POLYPECTOMY;  Surgeon: Lucilla Lame, MD;  Location: [place];  Service: Endoscopy;;   SPINE SURGERY  2004   TEE WITHOUT CARDIOVERSION N/A 01/18/2021   Procedure: TRANSESOPHAGEAL ECHOCARDIOGRAM (TEE);  Surgeon: [name], MD;   Location: [place];  Service: Open Heart Surgery;  Laterality: N/A;   TONSILLECTOMY     TUBAL LIGATION     Social History:  reports that she has been smoking cigarettes. She started smoking about 50 years ago. She has a 40.00 pack-year smoking history. She has been exposed to tobacco smoke. She has never used smokeless tobacco. She reports current alcohol use. She reports that she does not use drugs.  Family / Support Systems Spouse/Significant Other: Elenore Rota Children: Kristin Anticipated Caregiver: spouse and daughter Ability/Limitations of Caregiver: spouse taking FMLA Caregiver Availability: 24/7 Family Dynamics: support from spouse and daughter  Social History Preferred language: English Religion: Patient Refused Legal History/Current Legal Issues: n/a Guardian/Conservator: Arts development officer   Abuse/Neglect Abuse/Neglect Assessment Can Be Completed: Yes Physical Abuse: Denies Verbal Abuse: Denies Sexual Abuse: Denies Exploitation of patient/patient's resources: Denies Self-Neglect: Denies Possible abuse reported to:: Other (Comment)  Patient response to: Social Isolation - How often do you feel lonely or isolated from those around you?: Never  Emotional Status Recent Psychosocial Issues: hx of anxiety & depression Psychiatric History: n/a Substance Abuse History: n/a  Patient / Family Perceptions, Expectations & Goals Pt/Family understanding of illness & functional limitations: yes Premorbid pt/family roles/activities: Independent overall Anticipated changes in roles/activities/participation: spouse and daughter able to assist with tasks Pt/family expectations/goals: MOD I to [place]: None Premorbid Home Care/DME Agencies: Other (Comment) Financial trader, Civil engineer, contracting) Transportation available at discharge: spouse or daughter Is the patient able to respond to transportation needs?: Yes In the past 12 months, has lack of transportation kept you  from medical appointments or from getting medications?: No In the past 12 months, has lack of transportation kept you from meetings, work, or from getting things needed for daily living?: No  Discharge Planning Living Arrangements: Spouse/significant other, Children Support Systems: Spouse/significant other, Children Type of Residence: Private residence Insurance Resources: Chartered certified accountant Resources: [employer] Screen Referred: No Living Expenses: Lives with family Money Management: Family, Significant Other Does the patient have any problems obtaining your medications?: No Home Management: Independent Patient/Family Preliminary Plans: Daughter & spouse able to assist if needed Care Coordinator Barriers to Discharge: Lack of/limited family support, Decreased caregiver support Care Coordinator Anticipated Follow Up Needs: HH/OP, SNF Expected length of stay: 7-10 Days  Clinical Impression Sw will meet with patient today after conference. Pt ready to d/c ASAP.   [name] 01/30/2021, 9:38 AM

## 2021-01-30 NOTE — Progress Notes (Signed)
Occupational Therapy Session Note  Patient Details  Name: Morgan Daniels MRN: [id number] Date of Birth: [birth year]  Today's Date: 01/30/2021 OT Individual Time: 4270-6237 OT Individual Time Calculation (min): 57 min    Short Term Goals: Week 1:  OT Short Term Goal 1 (Week 1): STGs = LTGs  Skilled Therapeutic Interventions/Progress Updates:  Pt greeted supine in bed agreeable to OT intervention. Session focus on education related to maintaining sternal precautions during ADL participation, functional mobility in the home, and AE related to ADLs. Education provided on all sternal precautions and compensatory methods in order to accommodate for precautions such as using a toileting wand for posterior pericare. Pt verbalized understanding of all education. Pt completed supine>sit with supervision, pt able to stand from EOB with S but needed verbal cues for hand placement when powering up into sit to stand as pt wanting to pull on RW. Pt completed stand pivot transfer from EOB>w/c with supervision. Pt transported to ADL apt with total A for time mgmt. Education provided on safe set- up of kitchen at home as well as compensatory methods for maintaining sternal precautions in the kitchen. Pt able to ambulate in kitchen with rw with CGA and complete simple meal prep of retrieving items from refrigerator and placing them in rw bag for transport, education provided on uses of RW bag and general fall prevention strategies in the kitchen. Pt verbalized understanding of all education. Pt completed functional mobility within apt to low couch with pt able to power up from couch with CGA. Eduation provided on trying to mobilize once every hour and setting up home routine around planning, prioritizing, positioning. Pt completed functional mobility back to room with supervision with rw. Pt completed sit>supine with supervision. Pt left supine in bed with bed alarm activated and all needs within reach.   Pt on 3L [place]  during session with SpO2>88%, pt denies SOB and able to rebound SpO2 with rest breaks and PLB.   Therapy Documentation Precautions:  Precautions Precautions: Fall, Sternal, Other (comment) (Cortrak) Precaution Comments: monitor O2 (does not wear at baseline) Restrictions Weight Bearing Restrictions: No RLE Weight Bearing: Weight bearing as tolerated LLE Weight Bearing: Weight bearing as tolerated Other Position/Activity Restrictions: sternal precautions   Pain: unrated general pain reported d/t "gas" LPN provided meds for gas prior to session.     Therapy/Group: Individual Therapy  Precious Haws 01/30/2021, 3:51 PM

## 2021-01-31 ENCOUNTER — Other Ambulatory Visit: Payer: Self-pay | Admitting: Nurse Practitioner

## 2021-01-31 DIAGNOSIS — F321 Major depressive disorder, single episode, moderate: Secondary | ICD-10-CM

## 2021-01-31 DIAGNOSIS — R5381 Other malaise: Secondary | ICD-10-CM | POA: Diagnosis not present

## 2021-01-31 LAB — GLUCOSE, CAPILLARY
Glucose-Capillary: 142 mg/dL — ABNORMAL HIGH (ref 70–99)
Glucose-Capillary: 165 mg/dL — ABNORMAL HIGH (ref 70–99)
Glucose-Capillary: 136 mg/dL — ABNORMAL HIGH (ref 70–99)
Glucose-Capillary: 148 mg/dL — ABNORMAL HIGH (ref 70–99)

## 2021-01-31 MED ORDER — OXYCODONE HCL 5 MG PO TABS
5.0000 mg | ORAL_TABLET | ORAL | Status: DC | PRN
  Administered 2021-01-31 (×3): 5 mg via ORAL
  Filled 2021-01-31 (×3): qty 1

## 2021-01-31 MED ORDER — CLOPIDOGREL BISULFATE 75 MG PO TABS: 75.0000 mg | ORAL_TABLET | Freq: Every day | ORAL | 11 refills | Status: DC

## 2021-01-31 MED ORDER — VITAMIN D-3 125 MCG (5000 UT) PO TABS: 5000.0000 [IU] | ORAL_TABLET | Freq: Every day | ORAL | 0 refills | Status: DC

## 2021-01-31 MED ORDER — TRESIBA FLEXTOUCH 100 UNIT/ML ~~LOC~~ SOPN
40.0000 [IU] | PEN_INJECTOR | Freq: Every day | SUBCUTANEOUS | 0 refills | Status: DC
Start: 2021-01-31 — End: 2021-03-29

## 2021-01-31 MED ORDER — UMECLIDINIUM-VILANTEROL 62.5-25 MCG/ACT IN AEPB: 1.0000 | INHALATION_SPRAY | Freq: Every day | RESPIRATORY_TRACT | 1 refills | Status: DC

## 2021-01-31 MED ORDER — AMIODARONE HCL 200 MG PO TABS
200.0000 mg | ORAL_TABLET | Freq: Every day | ORAL | Status: DC
  Administered 2021-01-31 – 2021-02-01 (×2): 200 mg via ORAL
  Filled 2021-01-31 (×2): qty 1

## 2021-01-31 MED ORDER — OXYCODONE HCL 5 MG PO TABS: 5.0000 mg | ORAL_TABLET | ORAL | 0 refills | Status: DC | PRN

## 2021-01-31 MED ORDER — FLUOXETINE HCL 40 MG PO CAPS: 80.0000 mg | ORAL_CAPSULE | Freq: Every day | ORAL | 3 refills | Status: DC

## 2021-01-31 MED ORDER — GABAPENTIN 600 MG PO TABS: 600.0000 mg | ORAL_TABLET | Freq: Three times a day (TID) | ORAL | 0 refills | Status: DC

## 2021-01-31 MED ORDER — ADULT MULTIVITAMIN W/MINERALS CH
1.0000 | ORAL_TABLET | Freq: Every day | ORAL | Status: DC
  Administered 2021-01-31 – 2021-02-01 (×2): 1 via ORAL
  Filled 2021-01-31 (×2): qty 1

## 2021-01-31 MED ORDER — CEPHALEXIN 500 MG PO CAPS: 500.0000 mg | ORAL_CAPSULE | Freq: Two times a day (BID) | ORAL | 0 refills | Status: DC

## 2021-01-31 MED ORDER — AMLODIPINE BESYLATE 10 MG PO TABS: 10.0000 mg | ORAL_TABLET | Freq: Every day | ORAL | 0 refills | Status: DC

## 2021-01-31 MED ORDER — METOPROLOL TARTRATE 25 MG PO TABS: 25.0000 mg | ORAL_TABLET | Freq: Two times a day (BID) | ORAL | 0 refills | Status: DC

## 2021-01-31 MED ORDER — AMIODARONE HCL 200 MG PO TABS: 200.0000 mg | ORAL_TABLET | Freq: Every day | ORAL | 0 refills | Status: DC

## 2021-01-31 MED ORDER — ASPIRIN 81 MG PO CHEW: 81.0000 mg | CHEWABLE_TABLET | Freq: Every day | ORAL | Status: DC

## 2021-01-31 MED ORDER — SACCHAROMYCES BOULARDII 250 MG PO CAPS: 250.0000 mg | ORAL_CAPSULE | Freq: Two times a day (BID) | ORAL | 0 refills | Status: DC

## 2021-01-31 MED ORDER — PANTOPRAZOLE SODIUM 40 MG PO PACK: 40.0000 mg | PACK | Freq: Every day | ORAL | 0 refills | Status: DC

## 2021-01-31 MED ORDER — ATORVASTATIN CALCIUM 80 MG PO TABS: 80.0000 mg | ORAL_TABLET | Freq: Every day | ORAL | 0 refills | Status: DC

## 2021-01-31 MED ORDER — NICOTINE 14 MG/24HR TD PT24: MEDICATED_PATCH | TRANSDERMAL | 0 refills | Status: DC

## 2021-01-31 NOTE — Progress Notes (Signed)
PROGRESS NOTE   Subjective/Complaints: Discussed good news of her getting off TF- I have d/ced feeds Discussed weaning oxycodone prior to d/c and to let us know if pain worsens Discussed d/c day Friday and discharge plan She has no new questions or concerns  ROS:  Pt denies SOB, abd pain, CP, N/V/C/D, and vision changes, +sternal pain, +dysuria   Objective:   DG Swallowing Func-Speech Pathology  Result Date: 01/30/2021 Table formatting from the original result was not included. Objective Swallowing Evaluation: Type of Study: MBS-Modified Barium Swallow Study  Patient Details Name: Morgan Daniels MRN: [id number] Date of Birth: Dec 20, 1960 Today's Date: 01/30/2021 Past Medical History: Past Medical History: Diagnosis Date  Anxiety   Arthritis   Bilateral carotid artery stenosis 2014  Carotid artery occlusion   Chronic kidney disease May 2017  UTI  Chronic kidney disease 2017  Current smoker   CVA (cerebral vascular accident) ([place]) 2013  Depression   Diabetes ([place])   Diverticulosis   Fatty liver   Fibromyalgia   GERD (gastroesophageal reflux disease)   H/O hiatal hernia   Hiatal hernia   Hypercholesteremia   Hypertension   IBS (irritable bowel syndrome)   PAD (peripheral artery disease) ([place])   Peptic ulcer   Plantar fasciitis   Stroke [facility]) Dec. 14,2013  Right side-ministroke  T2DM (type 2 diabetes mellitus) ([place])  Past Surgical History: Past Surgical History: Procedure Laterality Date  ABDOMINAL HYSTERECTOMY  1990  BACK SURGERY  2000, 2004  CAROTID ENDARTERECTOMY Left 05/06/12  CARPAL TUNNEL RELEASE Left 07/18/2015  Procedure: CARPAL TUNNEL RELEASE;  Surgeon: Earnestine Leys, MD;  Location: [facility] ORS;  Service: Orthopedics;  Laterality: Left;  CEREBRAL ANGIOGRAM Bilateral 05/03/2012  Procedure: CEREBRAL ANGIOGRAM;  Surgeon: Angelia Mould, MD;  Location: [facility] CATH LAB;  Service: Cardiovascular;  Laterality: Bilateral;  CHOLECYSTECTOMY  2001   COLONOSCOPY WITH PROPOFOL N/A 11/19/2018  Procedure: COLONOSCOPY WITH PROPOFOL;  Surgeon: Lucilla Lame, MD;  Location: [place];  Service: Endoscopy;  Laterality: N/A;  Diabetic - insulin  CORONARY ARTERY BYPASS GRAFT N/A 01/18/2021  Procedure: CORONARY ARTERY BYPASS GRAFTING (CABG) x 4  USING LEFT INTERNAL MAMMARY ARTERY AND LEFT ENDOSCOPIC GREATER SAPHENOUS VEIN CONDUITS;  Surgeon: [name], MD;  Location: [place];  Service: Open Heart Surgery;  Laterality: N/A;  CORONARY/GRAFT ACUTE MI REVASCULARIZATION N/A 01/13/2021  Procedure: Coronary/Graft Acute MI Revascularization;  Surgeon: Isaias Cowman, MD;  Location: [place] CV LAB;  Service: Cardiovascular;  Laterality: N/A;  ENDARTERECTOMY Left 05/06/2012  Procedure: ENDARTERECTOMY CAROTID;  Surgeon: Angelia Mould, MD;  Location: [place];  Service: Vascular;  Laterality: Left;  ENDARTERECTOMY Right 08/09/2013  Procedure: ENDARTERECTOMY CAROTID-RIGHT;  Surgeon: Angelia Mould, MD;  Location: [place];  Service: Vascular;  Laterality: Right;  ENDOVEIN HARVEST OF GREATER SAPHENOUS VEIN Left 01/18/2021  Procedure: ENDOVEIN HARVEST OF GREATER SAPHENOUS VEIN;  Surgeon: [name], MD;  Location: [place];  Service: Open Heart Surgery;  Laterality: Left;  HERNIA REPAIR    IABP INSERTION Right 01/13/2021  Procedure: IABP Insertion;  Surgeon: Isaias Cowman, MD;  Location: [place] CV LAB;  Service: Cardiovascular;  Laterality: Right;  LEFT HEART CATH AND CORONARY ANGIOGRAPHY N/A  01/13/2021  Procedure: LEFT HEART CATH AND CORONARY ANGIOGRAPHY;  Surgeon: Isaias Cowman, MD;  Location: [place] CV LAB;  Service: Cardiovascular;  Laterality: N/A;  LOWER EXTREMITY ANGIOGRAPHY Left 01/03/2021  Procedure: LOWER EXTREMITY ANGIOGRAPHY;  Surgeon: Algernon Huxley, MD;  Location: [place] CV LAB;  Service: Cardiovascular;  Laterality: Left;  PATCH ANGIOPLASTY Left 05/06/2012  Procedure: WITH DACRON PATCH ANGIOPLASTY  ;  Surgeon: Angelia Mould, MD;  Location: [place];  Service: Vascular;  Laterality: Left;  POLYPECTOMY  11/19/2018  Procedure: POLYPECTOMY;  Surgeon: Lucilla Lame, MD;  Location: [place];  Service: Endoscopy;;  SPINE SURGERY  2004  TEE WITHOUT CARDIOVERSION N/A 01/18/2021  Procedure: TRANSESOPHAGEAL ECHOCARDIOGRAM (TEE);  Surgeon: [name], MD;  Location: [place];  Service: Open Heart Surgery;  Laterality: N/A;  TONSILLECTOMY    TUBAL LIGATION   HPI: See H&P  Subjective: alert, denies h/o dysphagia  Recommendations for follow up therapy are one component of a multi-disciplinary discharge planning process, led by the attending physician.  Recommendations may be updated based on patient status, additional functional criteria and insurance authorization. Assessment / Plan / Recommendation Clinical Impressions 01/30/2021 Clinical Impression Patients overall swallow function appears improved since initial MBS but patient continues to demonstrate a mild oropharyngeal dysphagia. Oral phase is characterized by prolonged mastication with solid textures.  Pharyngeal phase is characterized by reduced pharyngeal squeeze and vestibular closure resulting in mostly flash penetration of liquids with trace amounts of penetrates that did clear after completion of the swallow intermittently.  Mild residue noted throughout the pharynx that was reduced with spontaneous use of multiple swallows. Recommend patient initiate a diet of Dys. 2 textures with thin liquids. Patient and husband verbalized understanding with a handout given regarding appropriate food textures. SLP Visit Diagnosis Dysphagia, oropharyngeal phase (R13.12) Attention and concentration deficit following -- Frontal lobe and executive function deficit following -- Impact on safety and function Mild aspiration risk   Treatment Recommendations 01/30/2021 Treatment Recommendations Therapy as outlined in treatment plan below   Prognosis 01/22/2021  Prognosis for Safe Diet Advancement Good Barriers to Reach Goals Cognitive deficits Barriers/Prognosis Comment -- Diet Recommendations 01/30/2021 SLP Diet Recommendations Dysphagia 2 (Fine chop) solids;Thin liquid Liquid Administration via Cup Medication Administration Crushed with puree Compensations Slow rate;Small sips/bites;Effortful swallow;Multiple dry swallows after each bite/sip Postural Changes Seated upright at 90 degrees   Other Recommendations 01/30/2021 Recommended Consults -- Oral Care Recommendations Oral care BID Other Recommendations -- Follow Up Recommendations Outpatient SLP Assistance recommended at discharge Intermittent Supervision/Assistance Functional Status Assessment -- Frequency and Duration  01/30/2021 Speech Therapy Frequency (ACUTE ONLY) min 3x week Treatment Duration 1 week   Oral Phase 01/30/2021 Oral Phase Impaired Oral - Pudding Teaspoon -- Oral - Pudding Cup -- Oral - Honey Teaspoon NT Oral - Honey Cup NT Oral - Nectar Teaspoon WFL Oral - Nectar Cup WFL Oral - Nectar Straw -- Oral - Thin Teaspoon -- Oral - Thin Cup WFL Oral - Thin Straw WFL Oral - Puree WFL Oral - Mech Soft -- Oral - Regular Impaired mastication Oral - Multi-Consistency -- Oral - Pill -- Oral Phase - Comment --  Pharyngeal Phase 01/30/2021 Pharyngeal Phase Impaired Pharyngeal- Pudding Teaspoon -- Pharyngeal -- Pharyngeal- Pudding Cup -- Pharyngeal -- Pharyngeal- Honey Teaspoon NT Pharyngeal -- Pharyngeal- Honey Cup NT Pharyngeal -- Pharyngeal- Nectar Teaspoon Delayed swallow initiation-vallecula;Penetration/Aspiration during swallow;Reduced pharyngeal peristalsis;Pharyngeal residue - valleculae Pharyngeal Material enters airway, remains ABOVE vocal cords and not ejected out Pharyngeal- Nectar Cup Delayed swallow initiation-vallecula;Penetration/Aspiration  during swallow;Reduced pharyngeal peristalsis;Pharyngeal residue - valleculae Pharyngeal Material enters airway, remains ABOVE vocal cords and not ejected out  Pharyngeal- Nectar Straw -- Pharyngeal -- Pharyngeal- Thin Teaspoon -- Pharyngeal -- Pharyngeal- Thin Cup Delayed swallow initiation-vallecula;Penetration/Aspiration during swallow Pharyngeal Material enters airway, remains ABOVE vocal cords and not ejected out Pharyngeal- Thin Straw Delayed swallow initiation-vallecula;Penetration/Aspiration during swallow Pharyngeal Material enters airway, remains ABOVE vocal cords and not ejected out Pharyngeal- Puree Delayed swallow initiation-vallecula;Reduced pharyngeal peristalsis;Pharyngeal residue - valleculae Pharyngeal -- Pharyngeal- Mechanical Soft -- Pharyngeal -- Pharyngeal- Regular Delayed swallow initiation-vallecula;Pharyngeal residue - valleculae Pharyngeal -- Pharyngeal- Multi-consistency -- Pharyngeal -- Pharyngeal- Pill -- Pharyngeal -- Pharyngeal Comment --  Cervical Esophageal Phase  01/22/2021 Cervical Esophageal Phase WFL Pudding Teaspoon -- Pudding Cup -- Honey Teaspoon -- Honey Cup -- Nectar Teaspoon -- Nectar Cup -- Nectar Straw -- Thin Teaspoon -- Thin Cup -- Thin Straw -- Puree -- Mechanical Soft -- Regular -- Multi-consistency -- Pill -- Cervical Esophageal Comment -- PAYNE, COURTNEY 01/30/2021, 3:31 PM      Weston Anna, MA, CCC-SLP                Recent Labs    01/29/21 0444  WBC 11.9*  HGB 8.8*  HCT 28.8*  PLT 385   No results for input(s): NA, K, CL, CO2, GLUCOSE, BUN, CREATININE, CALCIUM in the last 72 hours.   Intake/Output Summary (Last 24 hours) at 01/31/2021 1104 Last data filed at 01/31/2021 0700 Gross per 24 hour  Intake 500 ml  Output --  Net 500 ml        Physical Exam: Vital Signs Blood pressure 122/63, pulse 73, temperature 98.1 F (36.7 C), temperature source Oral, resp. rate 18, height 5\' 7"  (1.702 m), weight 89 kg, SpO2 92 %. General: awake, alert, appropriate, somewhat irritable, BMI 30.66 HENT: conjugate gaze; oropharynx moist; NGT and O2 in place CV: regular rate; no JVD,  Pulmonary: CTA B/L; no W/R/R-  good air movement, desat to 68% at rest off O2 GI: soft, NT, ND, (+)BS Psychiatric: irritable Neurological: alert  Musculoskeletal:        General: Tenderness (chest wall tender) present. Normal range of motion.     Cervical back: Normal range of motion. Tr  LE edema Skin:    Comments: Midline chest incision clean and dry.  Neuro:Normal insight and awareness. Intact Memory. Normal language and speech. Strong voice, no dysarthria. Cranial nerve exam unremarkable. UE 4/5 prox to distal. LE: 3/5 HF, 4-/5 KE, 4/5 ADF/PF. Sensory exam normal for light touch and pain in all 4 limbs. No limb ataxia or cerebellar signs. No abnormal tone appreciated.   Assessment/Plan: 1. Functional deficits which require 3+ hours per day of interdisciplinary therapy in a comprehensive inpatient rehab setting. Physiatrist is providing close team supervision and 24 hour management of active medical problems listed below. Physiatrist and rehab team continue to assess barriers to discharge/monitor patient progress toward functional and medical goals  Care Tool:  Bathing    Body parts bathed by patient: Right arm, Left arm, Chest, Abdomen, Face   Body parts bathed by helper: Buttocks     Bathing assist Assist Level: Supervision/Verbal cueing     Upper Body Dressing/Undressing Upper body dressing   What is the patient wearing?: Pull over shirt    Upper body assist Assist Level: Minimal Assistance - Patient > 75%    Lower Body Dressing/Undressing Lower body dressing      What is the patient wearing?: Pants  Lower body assist Assist for lower body dressing: Supervision/Verbal cueing     Toileting Toileting    Toileting assist Assist for toileting: Minimal Assistance - Patient > 75%     Transfers Chair/bed transfer  Transfers assist     Chair/bed transfer assist level: Contact Guard/Touching assist     Locomotion Ambulation   Ambulation assist      Assist level: Contact  Guard/Touching assist Assistive device: No Device Max distance: 122ft   Walk 10 feet activity   Assist     Assist level: Contact Guard/Touching assist Assistive device: No Device   Walk 50 feet activity   Assist    Assist level: Contact Guard/Touching assist Assistive device: No Device    Walk 150 feet activity   Assist Walk 150 feet activity did not occur: Safety/medical concerns (Impulsiveness, fatigue)  Assist level: Contact Guard/Touching assist Assistive device: No Device    Walk 10 feet on uneven surface  activity   Assist Walk 10 feet on uneven surfaces activity did not occur: Safety/medical concerns (Impulsiveness, fatigue)   Assist level: Supervision/Verbal cueing Assistive device: Walker-rolling   Wheelchair     Assist Is the patient using a wheelchair?: Yes Type of Wheelchair: Manual    Wheelchair assist level: Dependent - Patient 0%      Wheelchair 50 feet with 2 turns activity    Assist        Assist Level: Dependent - Patient 0%   Wheelchair 150 feet activity     Assist      Assist Level: Dependent - Patient 0%   Blood pressure 122/63, pulse 73, temperature 98.1 F (36.7 C), temperature source Oral, resp. rate 18, height 5\' 7"  (1.702 m), weight 89 kg, SpO2 92 %.  Medical Problem List and Plan: 1.  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 Impaired mobility, ambulating 75 feet: continue Lovenox 40 mg QD             -antiplatelet therapy: Plavix 75 mg daily and aspirin 81 mg QD 3. Pain Management: decrease oxycodone IR to 5 mg to every 4 hours PRN, Neurontin 600 mg TID 4. Mood: continue Prozac, LCSW to provide emotional support             -antipsychotic agents: n/a             -nicotine patch ordered for nicotine craving 14mg   12/11- pt wants to  leave- likely due to nicotine cravings- unable to leave on pass 5. Neuropsych: This patient is capable of making decisions on her own behalf. 6. Skin/Wound Care: sternal wound cdi, can get wet.  7. Fluids/Electrolytes/Nutrition: routine I/Os and follow-up chemistries 8. Hypertension: BP stable, rate controlled, continue Pacerone, Norvasc, Lopressor             -bp controlled at present  12/11- BP controlled 130/70 this AM- con't regimen 9.Hyperlipidemia: continue Lipitor 10. Diabetes mellitus type 2: continue insuline regimen and CBG checks             -12/10- CBGs adequate control- con't regimen 11. COPD: continue Anoro Ellipta (prescribe at discharge) 12. Tobacco use: provide nicotine patch, smoking cessation counseling 13. PUD/GERD: continue PPI 14.  Acute blood loss anemia/leukocytosis.  Follow-up chemistries             -  no signs of blood loss on exam 15.  Dysphagia.   D/c Cortrak as per SLP  Provided education about infection risk if she continues to consume foods brought by family.  16. Foley             -has been in for diuresis             -recent hematuria d/t foley trauma             -remove foley in AM tomorrow and begin voiding trial.  12/10- remove foley- and cath if volumes >350cc- ordered bladder scans.    - foley was removed before pt came over- got call from nurse at 1:53 pm- having hematuria again- will check labs in AM to make sure not large amount of blood loss- don't want to place foley again due to recent hx of trauma FROM a foley- and see if can go without placing- if it gets worse, will call Urology.   12/11- Hb 9.2 from 9.7- will recheck again in AM and if still drops, will need to call Urology.  17. Resp failure- continue O2 given desat to 68%at rest 18. Obesity BMI 30.66: provide dietary education 19. Leukocytosis: ttrending down, possibly 2/2 UTI: treat as below.  20. UTI: Keflex 500mg  BID ordered 21. Disposition: HFU scheduled       LOS: 6 days A FACE  TO FACE EVALUATION WAS PERFORMED  Jaylanie Boschee P Wyndham Santilli 01/31/2021, 11:04 AM

## 2021-01-31 NOTE — Progress Notes (Signed)
Nutrition Follow-up  DOCUMENTATION CODES:  Not applicable  INTERVENTION:  Continue Magic cup TID with meals, each supplement provides 290 kcal and 9 grams of protein.  Add MVI with minerals daily.  Encourage PO and supplement intake.  NUTRITION DIAGNOSIS:  Inadequate oral intake related to inability to eat as evidenced by NPO status. - resolved  GOAL:  Patient will meet greater than or equal to 90% of their needs. - progressing  MONITOR:  Diet advancement, Labs, Weight trends, TF tolerance, Skin, I & O's  REASON FOR ASSESSMENT:  Consult Enteral/tube feeding initiation and management  ASSESSMENT:  60 year old female with history of COPD, diabetes mellitus, hypertension, hyperlipidemia, prior stroke, gastroesophageal reflux disease and hiatal hernia who presented initially for management of acute respiratory failure and unresponsiveness. Pt likely with flash pulmonary edema related to her acute MI. Pt underwent left heart catheterization demonstrating severe three-vessel coronary artery disease and mildly reduced left ventricular function. She underwent three-vessel coronary artery bypass grafting 12/2. She underwent SLP evaluation on December 22, 2020 with findings of moderate aspiration risk, moderate pharyngeal dysphagia.  She was made NPO. Cortrak was placed. Rehabilitation services for functional deficits secondary to recent critical illness, open heart surgery. 12/9 - Cortrak placed (tip gastric)  12/14 - advanced Dysphagia 2/thins 12/15 - Cortrak removed; TF stopped  Plan for discharge tomorrow.  Per Epic, pt ate 100% of lunch and 75% of dinner yesterday. Today, ate 50% of breakfast and 75% of lunch.  Spoke with pt at bedside. She reports that she has more of an appetite and that she is happy that the tube is out.  Admit wt: 87.3 kg Current wt: 89 kg  Of note, pt with mild generalized edema.  Pt noted she does not like Ensure products.  Medications: reviewed; SSI,  Semglee, oxycodone PO PRN (given once today)  Labs: reviewed; CBG 117-181 (H)  Diet Order:   Diet Order             DIET DYS 2 Room service appropriate? Yes; Fluid consistency: Thin  Diet effective now                  EDUCATION NEEDS:  Education needs have been addressed  Skin:  Skin Assessment: Skin Integrity Issues: Skin Integrity Issues:: Incisions Incisions: chest, L leg  Last BM:  01/31/21  Height:  Ht Readings from Last 1 Encounters:  01/25/21 5\' 7"  (1.702 m)   Weight:  Wt Readings from Last 1 Encounters:  01/31/21 89 kg   BMI:  Body mass index is 30.73 kg/m.  Estimated Nutritional Needs:  Kcal:  1800-2000 Protein:  90-110 grams Fluid:  >/= 1.5 L/day  Derrel Nip, RD, LDN (she/her/hers) Clinical Inpatient Dietitian RD Pager/After-Hours/Weekend Pager # in [place]

## 2021-01-31 NOTE — Progress Notes (Signed)
Occupational Therapy Session Note  Patient Details  Name: Morgan Daniels MRN: [id number] Date of Birth: [birth year]  Today's Date: 01/31/2021 OT Individual Time: 1120-1200 OT Individual Time Calculation (min): 40 min    Short Term Goals: Week 1:  OT Short Term Goal 1 (Week 1): STGs = LTGs  Skilled Therapeutic Interventions/Progress Updates:  Skilled OT intervention completed with focus on d/c planning, education on rehab goals and POC, and education about sternal precautions. Pt received supine in bed, agreeable to session. Addressed pt's questions regarding d/c home, ensuring DME is secured, with home management strategies provided with recommendations for placing AD and AE throughout the home. Educated pt on sternal precautions as pt is very non-compliant with precautions during transfers and self-care tasks. Provided pt a handout discussing the "stay in the tube" method, energy conservation strategies, and educated pt on techniques for specific scenarios in the home. Pt able to teach back the tube method, with talking her bed mobility strategy out loud. LPN in room for meds/wound care. Pt left supine in bed, with bed alarm activated, and all needs in reach at end of session.  Therapy Documentation Precautions:  Precautions Precautions: Fall, Sternal Precaution Comments: monitor O2 Restrictions Weight Bearing Restrictions: No RLE Weight Bearing: Weight bearing as tolerated LLE Weight Bearing: Weight bearing as tolerated Other Position/Activity Restrictions: sternal precautions  Pain: No c/o pain   Therapy/Group: Individual Therapy  Darsha Zumstein E Trecia Maring 01/31/2021, 7:49 AM

## 2021-01-31 NOTE — Progress Notes (Signed)
Physical Therapy Session Note  Patient Details  Name: Morgan Daniels MRN: [id number] Date of Birth: [birth year]  Today's Date: 01/31/2021 PT Individual Time: 7639-4320 PT Individual Time Calculation (min): 56 min   Short Term Goals: Week 1:  PT Short Term Goal 1 (Week 1): STG = LTG due to ELOS  Skilled Therapeutic Interventions/Progress Updates:  Pt received supine in bed, reported 5/10 pain in sternum and requested pain meds. Pt unable to receive meds, provided ice pack and distraction for pain modulation. Emphasis of session on education regarding DC, precautions, and global conditioning. Pt agreeable to bed-level exercise only 2/2 fatigue and pain, reported she had "learned every thing already" and did not want to get up despite max encouragement.   The following exercises were performed in supine for improved posterior chain and BLE strength: -Glute bridges, 3x10 w/3s isometric hold  -Supine marches, 3x10 per side   Pt hyperverbal throughout session, reflecting on her family and her past. Therapist acted as active listener as needed. Lengthy educational discussion regarding implementation of walking program at home and smoking cessation for improved wound healing, cardiovascular endurance and overall health. Pt reported shock regarding "how easy" it has been to stop smoking while in CIR and verbalized no intention to pick the habit back up. Encouraged pt to start w/10 min purposeful walking program at home w/RW and S*, pt verbalized understanding. Pt was left supine in bed, ice pack on chest, all needs in reach.   Therapy Documentation Precautions:  Precautions Precautions: Fall, Sternal Precaution Comments: monitor O2 Restrictions Weight Bearing Restrictions: No RLE Weight Bearing: Weight bearing as tolerated LLE Weight Bearing: Weight bearing as tolerated Other Position/Activity Restrictions: sternal precautions   Therapy/Group: Individual Therapy Cruzita Lederer Denise Washburn, PT,  DPT  01/31/2021, 7:51 AM

## 2021-01-31 NOTE — Progress Notes (Signed)
Inpatient Rehabilitation DischargeMedication Review by a Pharmacist  A complete drug regimen review was completed for this patient to identify any potential clinically significant medication issues.  High Risk Drug Classes Is patient taking? Indication by Medication  Antipsychotic No   Anticoagulant Yes Lovenox- VTE prophylaxis  Antibiotic Yes Keflex for UTI  Opioid Yes OxyIR- pain  Antiplatelet Yes Baby aspirin, plavix- CVA prophylaxis, PAD  Hypoglycemics/insulin Yes Tyler Aas for DM  Vasoactive Medication Yes Lopressor, amiodarone, norvasc- hypertension, rate control  Chemotherapy No   Other No      Type of Medication Issue Identified Description of Issue Recommendation(s)  Drug Interaction(s) (clinically significant)     Duplicate Therapy     Allergy     No Medication Administration End Date     Incorrect Dose     Additional Drug Therapy Needed     Significant med changes from prior encounter (inform family/care partners about these prior to discharge).    Other       Clinically significant medication issues were identified that warrant physician communication and completion of prescribed/recommended actions by midnight of the next day:  No   Time spent performing this drug regimen review (minutes):  15-103min  Tisha Cline S. Alford Highland, PharmD, BCPS Clinical Staff Pharmacist [email or web address] 01/31/2021 12:47 PM

## 2021-01-31 NOTE — Evaluation (Signed)
Recreational Therapy Assessment and Plan  Patient Details  Name: Morgan Daniels MRN: [id number] Date of Birth: 1960/10/11 Today's Date: 01/31/2021  Rehab Potential:  Good  ELOS:   d/c 12/16  Assessment   Hospital Problem: Principal Problem:   Debility     Past Medical History:      Past Medical History:  Diagnosis Date   Anxiety     Arthritis     Bilateral carotid artery stenosis 2014   Carotid artery occlusion     Chronic kidney disease May 2017    UTI   Chronic kidney disease 2017   Current smoker     CVA (cerebral vascular accident) ([place]) 2013   Depression     Diabetes ([place])     Diverticulosis     Fatty liver     Fibromyalgia     GERD (gastroesophageal reflux disease)     H/O hiatal hernia     Hiatal hernia     Hypercholesteremia     Hypertension     IBS (irritable bowel syndrome)     PAD (peripheral artery disease) ([place])     Peptic ulcer     Plantar fasciitis     Stroke [facility]) Dec. 14,2013    Right side-ministroke   T2DM (type 2 diabetes mellitus) ([place])      Past Surgical History:       Past Surgical History:  Procedure Laterality Date   ABDOMINAL HYSTERECTOMY   1990   BACK SURGERY   2000, 2004   CAROTID ENDARTERECTOMY Left 05/06/12   CARPAL TUNNEL RELEASE Left 07/18/2015    Procedure: CARPAL TUNNEL RELEASE;  Surgeon: Earnestine Leys, MD;  Location: [facility] ORS;  Service: Orthopedics;  Laterality: Left;   CEREBRAL ANGIOGRAM Bilateral 05/03/2012    Procedure: CEREBRAL ANGIOGRAM;  Surgeon: Angelia Mould, MD;  Location: [facility] CATH LAB;  Service: Cardiovascular;  Laterality: Bilateral;   CHOLECYSTECTOMY   2001   COLONOSCOPY WITH PROPOFOL N/A 11/19/2018    Procedure: COLONOSCOPY WITH PROPOFOL;  Surgeon: Lucilla Lame, MD;  Location: [place];  Service: Endoscopy;  Laterality: N/A;  Diabetic - insulin   CORONARY ARTERY BYPASS GRAFT N/A 01/18/2021    Procedure: CORONARY ARTERY BYPASS GRAFTING (CABG) x 4  USING LEFT INTERNAL MAMMARY ARTERY AND LEFT  ENDOSCOPIC GREATER SAPHENOUS VEIN CONDUITS;  Surgeon: [name], MD;  Location: [place];  Service: Open Heart Surgery;  Laterality: N/A;   CORONARY/GRAFT ACUTE MI REVASCULARIZATION N/A 01/13/2021    Procedure: Coronary/Graft Acute MI Revascularization;  Surgeon: Isaias Cowman, MD;  Location: [place] CV LAB;  Service: Cardiovascular;  Laterality: N/A;   ENDARTERECTOMY Left 05/06/2012    Procedure: ENDARTERECTOMY CAROTID;  Surgeon: Angelia Mould, MD;  Location: [place];  Service: Vascular;  Laterality: Left;   ENDARTERECTOMY Right 08/09/2013    Procedure: ENDARTERECTOMY CAROTID-RIGHT;  Surgeon: Angelia Mould, MD;  Location: [place];  Service: Vascular;  Laterality: Right;   ENDOVEIN HARVEST OF GREATER SAPHENOUS VEIN Left 01/18/2021    Procedure: ENDOVEIN HARVEST OF GREATER SAPHENOUS VEIN;  Surgeon: [name], MD;  Location: [place];  Service: Open Heart Surgery;  Laterality: Left;   HERNIA REPAIR       IABP INSERTION Right 01/13/2021    Procedure: IABP Insertion;  Surgeon: Isaias Cowman, MD;  Location: [place] CV LAB;  Service: Cardiovascular;  Laterality: Right;   LEFT HEART CATH AND CORONARY ANGIOGRAPHY N/A 01/13/2021    Procedure: LEFT HEART CATH AND CORONARY ANGIOGRAPHY;  Surgeon: Isaias Cowman,  MD;  Location: [place] CV LAB;  Service: Cardiovascular;  Laterality: N/A;   LOWER EXTREMITY ANGIOGRAPHY Left 01/03/2021    Procedure: LOWER EXTREMITY ANGIOGRAPHY;  Surgeon: Algernon Huxley, MD;  Location: [place] CV LAB;  Service: Cardiovascular;  Laterality: Left;   PATCH ANGIOPLASTY Left 05/06/2012    Procedure: WITH DACRON PATCH ANGIOPLASTY ;  Surgeon: Angelia Mould, MD;  Location: [place];  Service: Vascular;  Laterality: Left;   POLYPECTOMY   11/19/2018    Procedure: POLYPECTOMY;  Surgeon: Lucilla Lame, MD;  Location: [place];  Service: Endoscopy;;   SPINE SURGERY   2004   TEE WITHOUT CARDIOVERSION N/A  01/18/2021    Procedure: TRANSESOPHAGEAL ECHOCARDIOGRAM (TEE);  Surgeon: [name], MD;  Location: [place];  Service: Open Heart Surgery;  Laterality: N/A;   TONSILLECTOMY       TUBAL LIGATION          Assessment & Plan Clinical Impression: Patient is a 60 y.o. year old female who presented to [facility] emergency department on January 12, 2021 via EMS for management of acute respiratory failure and unresponsiveness.  She was admitted to critical care medicine and required intubation and mechanical ventilation.  Further work-up revealed ST depressions in V leads on EKG and serial troponins at approximately 3000.  Heparin infusion for non-ST elevation MI was initiated.  She was able to be weaned and extubated but developed respiratory distress again and was reintubated.   Cardiology was consulted and echocardiography performed revealed an estimated ejection fraction of 60 to 65%.  They assessed her as likely having flash pulmonary edema related to her acute MI.  She was taken to the cardiac catheterization lab and underwent left heart catheterization demonstrating severe three-vessel coronary artery disease and mildly reduced left ventricular function with estimated LV ejection fraction of 40%.  Intra-aortic balloon pump was placed.  She was then transferred to [facility] for further evaluation and management of her three-vessel coronary artery disease.  Critical care medicine was consulted.     Cardiothoracic surgery/Dr. Roxan Hockey was consulted.  Urgent coronary artery bypass graft surgery was recommended.  She remained intubated, mechanically ventilated and sedated.  Her illness was complicated by hypoxic respiratory failure>> multifactorial.  She developed multifocal pneumonia with possible aspiration.  Antibiotic course was completed.  She underwent three-vessel coronary artery bypass grafting on January 18, 2021 by Dr. Roxan Hockey.   Her IABP was  removed on POD 1. She self-extubated on POD 2.  She underwent SLP evaluation on December 22, 2020 with findings of moderate aspiration risk, moderate pharyngeal dysphagia.  She was made NPO.  Cortrak was placed and she was receiving Prosource twice daily.  The patient had a fall without acute injury on January 23, 2021 and Cortrak was inadvertently dislodged.  Follow-up speech therapy swallowing evaluation she was permitted to have small snacks of pures and honey thick liquid awaiting plan to replace nasogastric tube.. She is maintained on Plavix, aspirin and statin.  She is receiving Lovenox 40 mg daily for DVT prophylaxis. She will need inpatient physical medicine and rehabilitation services for functional deficits secondary to recent critical illness, open heart surgery.   Significant past medical history includes tobacco use, COPD, diabetes mellitus, hypertension, hyperlipidemia, prior stroke, gastroesophageal reflux disease and hiatal hernia. No history of renal disease.   Prior to admission, the patient lived independently at home with her husband.  Requiring minimal assist with upper extremity and maximal assist for lower extremity  ADLs.  Met with pt today to discuss leisure education, activity analysis/modifications.  Pt stated feeling excited about returning home but had multiple lifestyle changes that needed to happen.  Pt able to identify potential modifications with min cues.  No further TR as pt is expected to discharge home tomorrow.   Plan  No further TR  Recommendations for other services: None   Discharge Criteria: Patient will be discharged from TR if patient refuses treatment 3 consecutive times without medical reason.  If treatment goals not met, if there is a change in medical status, if patient makes no progress towards goals or if patient is discharged from hospital.  The above assessment, treatment plan, treatment alternatives and goals were discussed and mutually agreed upon:  by patient  [place] 01/31/2021, 4:14 PM

## 2021-01-31 NOTE — Progress Notes (Signed)
Speech Language Pathology Discharge Summary  Patient Details  Name: Morgan Daniels MRN: [id number] Date of Birth: 1960-03-29  Today's Date: 01/31/2021 SLP Individual Time: 8891-6945 SLP Individual Time Calculation (min): 40 min   Skilled Therapeutic Interventions:  Patient seen for skilled ST session focusing on cognitive and swallow function goals. Patient in bed and although she stated she was tired, was able to fully participate. She requested pills not be crushed when nurse in room with morning medications. SLP reviewed MBS report and films and then observed patient taking medications whole. She did not exhibit any overt s/s aspiration or penetration and now that she has Coretrak feeding tube removed, likely will have decreased amount of pharyngeal residuals with solids. SLP upgraded patient's diet order to allow for pills whole. Patient educated about swallow precautions and she was able to demonstrate recall and understanding of recommendation for not using straws. Patient required overall minA verbal cues to redirect attention and to clarify/explain responses during discussion of discharge home tomorrow. Without cues, patient provides mainly vague responses. Patient is appropriate for discharge from SLP services at this time and discharging from CIR to home tomorrow.      Patient has met 5 of 6 long term goals.  Patient to discharge at overall Supervision level.  Reasons goals not met: patient still requiring more than supervision level assistance for awareness to deficits and impact on function   Clinical Impression/Discharge Summary: Patient met 5 of 6 LTG's during this short CIR stay and at time of discharge is tolerating a Dys 2, thin liquids diet at supervision level. She is at supervision level for safety, problem solving but still requiring cues (approximatley minA level) for awareness to deficits, complex level problem solving/reasoning. Patient to be discharging home with family and  will benefit from [facility] SLP upon discharge to continue progressing with cognitive and swallow function goals.  Care Partner:  Caregiver Able to Provide Assistance: Yes  Type of Caregiver Assistance: Physical;Cognitive  Recommendation:  Home Health SLP  Rationale for SLP Follow Up: Reduce caregiver burden;Maximize cognitive function and independence;Maximize swallowing safety   Equipment: none for ST   Reasons for discharge: Discharged from hospital       Sonia Baller, [place], [place] Speech [facility]

## 2021-01-31 NOTE — Progress Notes (Signed)
Physical Therapy Discharge Summary  Patient Details  Name: Morgan Daniels MRN: [id number] Date of Birth: 06/18/1960  Today's Date: 01/31/2021 PT Individual Time: 1015-1055 PT Individual Time Calculation (min): 40 min   Patient has met 8 of 8 long term goals due to improved activity tolerance, improved balance, improved postural control, increased strength, decreased pain, and improved coordination. Patient to discharge at an ambulatory level Supervision. Patient's care partner is independent to provide the necessary physical and cognitive assistance at discharge. Pt continues to demonstrate poor safety awareness with impulsivity and attention deficits as well as poor adherence to sternal precautions and decreased insight into deficits. Have educated on recommendation for 24/7 supervision as well as importance of adhering to sternal precautions and wearing supplemental O2.   All goals met   Recommendation:  Patient will benefit from ongoing skilled PT services in home health setting to continue to advance safe functional mobility, address ongoing impairments in generalized strengthening, dynamic standing balance/coordination, gait training, adherence to precautions, endurance, and to minimize fall risk.  Equipment: RW, supplemental O2  Reasons for discharge: treatment goals met  Patient/family agrees with progress made and goals achieved: Yes  Today's Interventions: Received pt sitting EOB, pt agreeable to PT treatment, and denied any pain during session. Session with emphasis on discharge planning, functional mobility, dynamic standing balance/coordination, gait training, stair navigation, and improved endurance with activity. O2 sat 89% on 3L at rest and remained >87% with activity but increased >90% with seated rest. Pt transferred sit<>stand with RW and supervision and ambulated 178ft x 2 trials with RW and supervision to/from main therapy gym with total A to manage O2 tank. Pt then  navigated 12 steps with R handrail and CGA using a lateral stepping technique with cues for hand placement on railing. Pt able to stand and pick up small cup from floor using RW and supervision. Equipment delivered to room, but pt reported already having a RW and declined it - emphasized importance of using RW upon D/C for safety and energy conservation purposes. Sit<>supine with supervision. Concluded session with pt semi-reclined in bed, needs within reach, and bed alarm on.   PT Discharge Precautions/Restrictions Precautions Precautions: Fall;Sternal Precaution Comments: monitor O2 Restrictions Weight Bearing Restrictions: No Other Position/Activity Restrictions: sternal precautions Pain Interference Pain Interference Pain Effect on Sleep: 2. Occasionally Pain Interference with Therapy Activities: 0. Does not apply - I have not received rehabilitationtherapy in the past 5 days Pain Interference with Day-to-Day Activities: 1. Rarely or not at all Cognition Overall Cognitive Status: Impaired/Different from baseline Arousal/Alertness: Awake/alert Orientation Level: Oriented X4 Memory: Impaired Awareness: Impaired Problem Solving: Impaired Behaviors: Impulsive Safety/Judgment: Impaired Comments: poor adherance to sternal precautions and decreased insight into deficits Sensation Sensation Light Touch: Appears Intact Proprioception: Appears Intact Additional Comments: pt reports neuropathy at baseline Coordination Gross Motor Movements are Fluid and Coordinated: Yes Fine Motor Movements are Fluid and Coordinated: No Coordination and Movement Description: generalized weakness/deconditioning, poor attention Finger Nose Finger Test: mild tremors bilaterally Heel Shin Test: [facility] Motor  Motor Motor: Other (comment) Motor - Skilled Clinical Observations: generalized weakness/deconditioning, poor attention  Mobility Bed Mobility Bed Mobility: Rolling Right;Rolling Left;Sit to  Supine;Supine to Sit Rolling Right: Supervision/verbal cueing Rolling Left: Supervision/Verbal cueing Supine to Sit: Supervision/Verbal cueing Sit to Supine: Supervision/Verbal cueing Transfers Transfers: Sit to Stand;Stand to Sit;Stand Pivot Transfers Sit to Stand: Supervision/Verbal cueing Stand to Sit: Supervision/Verbal cueing Stand Pivot Transfers: Supervision/Verbal cueing Stand Pivot Transfer Details: Verbal cues for precautions/safety;Verbal cues for safe use of  DME/AE Stand Pivot Transfer Details (indicate cue type and reason): verbal cues to maintain sternal precautions Transfer (Assistive device): Rolling walker Locomotion  Gait Ambulation: Yes Gait Assistance: Supervision/Verbal cueing Gait Distance (Feet): 150 Feet Assistive device: Rolling walker Gait Assistance Details: Verbal cues for safe use of DME/AE Gait Assistance Details: verbal cues to L/R attention to avoid running into obstacles Gait Gait: Yes Gait Pattern: Impaired Gait Pattern: Narrow base of support;Decreased stride length;Poor foot clearance - left;Poor foot clearance - right Gait velocity: decreased Stairs / Additional Locomotion Stairs: Yes Stairs Assistance: Contact Guard/Touching assist Stair Management Technique: One rail Right Number of Stairs: 4 Height of Stairs: 6 Ramp: Supervision/Verbal cueing (RW) Wheelchair Mobility Wheelchair Mobility: No  Trunk/Postural Assessment  Cervical Assessment Cervical Assessment: Within Functional Limits Thoracic Assessment Thoracic Assessment: Exceptions to [facility] (kyphosis) Lumbar Assessment Lumbar Assessment: Exceptions to [facility] (posterior pelvic tilt) Postural Control Postural Control: Within Functional Limits  Balance Balance Balance Assessed: Yes Static Sitting Balance Static Sitting - Balance Support: Feet supported;Bilateral upper extremity supported Static Sitting - Level of Assistance: 7: Independent Dynamic Sitting Balance Dynamic Sitting -  Balance Support: Feet supported;No upper extremity supported Dynamic Sitting - Level of Assistance: 6: Modified independent (Device/Increase time) Static Standing Balance Static Standing - Balance Support: Bilateral upper extremity supported (RW) Static Standing - Level of Assistance: 5: Stand by assistance (supervision) Dynamic Standing Balance Dynamic Standing - Balance Support: Bilateral upper extremity supported (RW) Dynamic Standing - Level of Assistance: 5: Stand by assistance (supervision) Dynamic Standing - Comments: with transfers and gait Extremity Assessment  RLE Assessment RLE Assessment: Exceptions to [facility] General Strength Comments: grossly generalized to 4/5 (except knee flexion 4-/5) LLE Assessment LLE Assessment: Exceptions to [facility] General Strength Comments: grossly generalized to 4/5 (except knee flexion 4-/5)  Alfonse Alpers PT, DPT  01/31/2021, 7:45 AM

## 2021-01-31 NOTE — Progress Notes (Signed)
Inpatient Rehabilitation Care Coordinator Discharge Note   Patient Details  Name: Morgan Daniels MRN: [id number] Date of Birth: 06-27-1960   Discharge location: Home  Length of Stay: 7 Days  Discharge activity level: Sup/Min  Home/community participation: spouse  Patient response HF:GBMSXJ Literacy - How often do you need to have someone help you when you read instructions, pamphlets, or other written material from your doctor or pharmacy?: Never  Patient response DB:ZMCEYE Isolation - How often do you feel lonely or isolated from those around you?: Never  Services provided included: SW, Pharmacy, TR, RN, CM, SLP, OT, PT, RD, MD  Financial Services:  Financial Services Utilized: Medicare    Choices offered to/list presented to: patient  Follow-up services arranged:  Home Health           Patient response to transportation need: Is the patient able to respond to transportation needs?: Yes In the past 12 months, has lack of transportation kept you from medical appointments or from getting medications?: No In the past 12 months, has lack of transportation kept you from meetings, work, or from getting things needed for daily living?: No    Comments (or additional information):  Patient/Family verbalized understanding of follow-up arrangements:  Yes  Individual responsible for coordination of the follow-up plan: Morgan Daniels [phone]  Confirmed correct DME delivered: Morgan Daniels 01/31/2021    Morgan Daniels

## 2021-01-31 NOTE — Progress Notes (Signed)
Occupational Therapy Discharge Summary  Patient Details  Name: Morgan Daniels MRN: [id number] Date of Birth: 12/21/1960   Patient has met 7 of 7 long term goals due to improved activity tolerance, improved balance, postural control, and improved coordination.  Patient to discharge at overall Supervision level.  Patient's care partner is independent to provide the necessary supervision assistance at discharge.    Reasons goals not met: n/a  Recommendation:  Patient will benefit from ongoing skilled OT services in home health setting to continue to advance functional skills in the area of BADL, iADL, and Reduce care partner burden.  Equipment: Has BSC and shower chair already  Reasons for discharge: treatment goals met  Patient/family agrees with progress made and goals achieved: Yes  OT Discharge Precautions/Restrictions  Precautions Precautions: Fall;Sternal Precaution Comments: monitor O2 Restrictions Weight Bearing Restrictions: No RLE Weight Bearing: Weight bearing as tolerated LLE Weight Bearing: Weight bearing as tolerated Other Position/Activity Restrictions: sternal precautions ADL ADL Equipment Provided: Long-handled sponge Eating: Modified independent Where Assessed-Eating: Chair Grooming: Modified independent Where Assessed-Grooming: Sitting at sink Upper Body Bathing: Supervision/safety Where Assessed-Upper Body Bathing: Sitting at sink Lower Body Bathing: Supervision/safety Where Assessed-Lower Body Bathing: Sitting at sink Upper Body Dressing: Supervision/safety Where Assessed-Upper Body Dressing: Sitting at sink Lower Body Dressing: Supervision/safety Where Assessed-Lower Body Dressing: Sitting at sink Toileting: Supervision/safety Where Assessed-Toileting: Bedside Commode Toilet Transfer: Close supervision Toilet Transfer Method: Counselling psychologist: Bedside commode Tub/Shower Transfer: Close [place] Transfer Method: Sit  pivot Tub/Shower Equipment: Radio broadcast assistant, Energy manager: Close supervision Social research officer, government Method: Heritage manager: Civil engineer, contracting with back, Grab bars Vision Baseline Vision/History: 1 Wears glasses Patient Visual Report: No change from baseline Vision Assessment?: No apparent visual deficits Perception  Perception: Within Functional Limits Praxis Praxis: Intact Cognition Overall Cognitive Status: Impaired/Different from baseline Arousal/Alertness: Awake/alert Orientation Level: Oriented X4 Year: 2022 Month: December Day of Week: Correct Immediate Memory Recall: Sock;Blue;Bed Memory Recall Sock: Without Cue Memory Recall Blue: Without Cue Memory Recall Bed: Without Cue Awareness: Impaired Problem Solving: Impaired Behaviors: Impulsive Comments: poor adherance to sternal precautions and decreased insight into deficits Sensation Sensation Light Touch: Appears Intact Hot/Cold: Appears Intact Proprioception: Appears Intact Stereognosis: Appears Intact Additional Comments: pt reports neuropathy at baseline Coordination Gross Motor Movements are Fluid and Coordinated: Yes Fine Motor Movements are Fluid and Coordinated: No Coordination and Movement Description: generalized weakness/deconditioning, poor attention Finger Nose Finger Test: mild tremors bilaterally Heel Shin Test: [facility] Motor  Motor Motor: Other (comment) Motor - Skilled Clinical Observations: generalized weakness/deconditioning, poor attention Mobility  Bed Mobility Bed Mobility: Rolling Right;Rolling Left;Sit to Supine;Supine to Sit Rolling Right: Supervision/verbal cueing Rolling Left: Supervision/Verbal cueing Supine to Sit: Supervision/Verbal cueing Sit to Supine: Supervision/Verbal cueing Transfers Sit to Stand: Supervision/Verbal cueing Stand to Sit: Supervision/Verbal cueing  Trunk/Postural Assessment  Cervical Assessment Cervical Assessment:  Within Functional Limits Thoracic Assessment Thoracic Assessment: Exceptions to [facility] (kyphosis) Lumbar Assessment Lumbar Assessment: Exceptions to [facility] (posterior pelvic tilt) Postural Control Postural Control: Within Functional Limits  Balance Balance Balance Assessed: Yes Static Sitting Balance Static Sitting - Balance Support: Feet supported;Bilateral upper extremity supported Static Sitting - Level of Assistance: 7: Independent Dynamic Sitting Balance Dynamic Sitting - Balance Support: Feet supported;No upper extremity supported Dynamic Sitting - Level of Assistance: 6: Modified independent (Device/Increase time) Static Standing Balance Static Standing - Balance Support: Bilateral upper extremity supported (RW) Static Standing - Level of Assistance: 5: Stand by assistance (supervision) Dynamic Standing Balance Dynamic  Standing - Balance Support: Bilateral upper extremity supported (RW) Dynamic Standing - Level of Assistance: 5: Stand by assistance (supervision) Dynamic Standing - Comments: with transfers and gait Extremity/Trunk Assessment RUE Assessment RUE Assessment: Within Functional Limits General Strength Comments: NT due to sternal precautions, but distal strength WFL LUE Assessment LUE Assessment: Within Functional Limits General Strength Comments: NT due to sternal precautions, but distal strength [facility]   Morgan Daniels 01/31/2021, 7:58 AM

## 2021-02-01 LAB — ECHO INTRAOPERATIVE TEE
AV Mean grad: 8 mmHg
Ao-asc: 3.7 cm
Height: 66 in
Mean grad: 2 mmHg
STJ: 2.8 cm
Sinus: 3.2 cm
Weight: 3086.44 oz

## 2021-02-01 LAB — GLUCOSE, CAPILLARY: Glucose-Capillary: 177 mg/dL — ABNORMAL HIGH (ref 70–99)

## 2021-02-01 MED ORDER — ADULT MULTIVITAMIN W/MINERALS CH: 1.0000 | ORAL_TABLET | Freq: Every day | ORAL | Status: DC

## 2021-02-01 NOTE — Progress Notes (Signed)
PROGRESS NOTE   Subjective/Complaints: No new complaints this morning She feels ready for discharge today Medications reviewed Awaiting O2 as she will require at home  ROS:  Pt denies SOB, abd pain, CP, N/V/C/D, and vision changes, +sternal pain, +dysuria   Objective:   No results found. No results for input(s): WBC, HGB, HCT, PLT in the last 72 hours.  No results for input(s): NA, K, CL, CO2, GLUCOSE, BUN, CREATININE, CALCIUM in the last 72 hours.   Intake/Output Summary (Last 24 hours) at 02/01/2021 0955 Last data filed at 02/01/2021 0831 Gross per 24 hour  Intake 640 ml  Output --  Net 640 ml        Physical Exam: Vital Signs Blood pressure (!) 151/72, pulse 78, temperature 98.5 F (36.9 C), temperature source Oral, resp. rate 20, height 5\' 7"  (1.702 m), weight 88 kg, SpO2 91 %. General: awake, alert, appropriate, pleasant, BMI 30.66, husband at bedside HENT: conjugate gaze; oropharynx moist; NGT and O2 in place CV: regular rate; no JVD,  Pulmonary: CTA B/L; no W/R/R- good air movement, desat to 68% at rest off O2 GI: soft, NT, ND, (+)BS Psychiatric: irritable Neurological: alert  Musculoskeletal:        General: Tenderness (chest wall tender) present. Normal range of motion.     Cervical back: Normal range of motion. Tr  LE edema Skin:    Comments: Midline chest incision clean and dry.  Neuro:Normal insight and awareness. Intact Memory. Normal language and speech. Strong voice, no dysarthria. Cranial nerve exam unremarkable. UE 4/5 prox to distal. LE: 3/5 HF, 4-/5 KE, 4/5 ADF/PF. Sensory exam normal for light touch and pain in all 4 limbs. No limb ataxia or cerebellar signs. No abnormal tone appreciated.   Assessment/Plan: 1. Functional deficits which require 3+ hours per day of interdisciplinary therapy in a comprehensive inpatient rehab setting. Physiatrist is providing close team supervision and 24  hour management of active medical problems listed below. Physiatrist and rehab team continue to assess barriers to discharge/monitor patient progress toward functional and medical goals  Care Tool:  Bathing    Body parts bathed by patient: Right arm, Left arm, Chest, Abdomen, Front perineal area, Right upper leg, Buttocks, Left upper leg, Right lower leg, Left lower leg, Face   Body parts bathed by helper: Buttocks     Bathing assist Assist Level: Supervision/Verbal cueing     Upper Body Dressing/Undressing Upper body dressing   What is the patient wearing?: Pull over shirt    Upper body assist Assist Level: Supervision/Verbal cueing    Lower Body Dressing/Undressing Lower body dressing      What is the patient wearing?: Pants, Underwear/pull up     Lower body assist Assist for lower body dressing: Supervision/Verbal cueing     Toileting Toileting    Toileting assist Assist for toileting: Supervision/Verbal cueing     Transfers Chair/bed transfer  Transfers assist     Chair/bed transfer assist level: Supervision/Verbal cueing     Locomotion Ambulation   Ambulation assist      Assist level: Supervision/Verbal cueing Assistive device: Walker-rolling Max distance: 171ft   Walk 10 feet activity   Assist  Assist level: Supervision/Verbal cueing Assistive device: Walker-rolling   Walk 50 feet activity   Assist    Assist level: Supervision/Verbal cueing Assistive device: Walker-rolling    Walk 150 feet activity   Assist Walk 150 feet activity did not occur: Safety/medical concerns (Impulsiveness, fatigue)  Assist level: Supervision/Verbal cueing Assistive device: Walker-rolling    Walk 10 feet on uneven surface  activity   Assist Walk 10 feet on uneven surfaces activity did not occur: Safety/medical concerns (Impulsiveness, fatigue)   Assist level: Supervision/Verbal cueing Assistive device: Walker-rolling    Wheelchair     Assist Is the patient using a wheelchair?: No Type of Wheelchair: Manual Wheelchair activity did not occur: N/A  Wheelchair assist level: Dependent - Patient 0%      Wheelchair 50 feet with 2 turns activity    Assist    Wheelchair 50 feet with 2 turns activity did not occur: N/A   Assist Level: Dependent - Patient 0%   Wheelchair 150 feet activity     Assist  Wheelchair 150 feet activity did not occur: N/A   Assist Level: Dependent - Patient 0%   Blood pressure (!) 151/72, pulse 78, temperature 98.5 F (36.9 C), temperature source Oral, resp. rate 20, height 5\' 7"  (1.702 m), weight 88 kg, SpO2 91 %.  Medical Problem List and Plan: 1.  Debility functional deficits secondary to CAD with CABG 01/18/2021/[facility]             -patient may shower             -ELOS/Goals: 7-10 days, mod I to supervision PT, OT, SLP  D/c home today 2. Impaired mobility, ambulating 75 feet. D/c loevenox upon d/c             -antiplatelet therapy: Plavix 75 mg daily and aspirin 81 mg QD 3. Sternal incision pain: decrease oxycodone IR to 5 mg to every 4 hours PRN, Neurontin 600 mg TID. Discussed that we will prescribed 1 week of oxycodone and she should obtain any further refills from her surgeon.  4. Depression: continue Prozac, LCSW to provide emotional support             -antipsychotic agents: n/a             -nicotine patch ordered for nicotine craving 14mg   12/11- pt wants to leave- likely due to nicotine cravings- unable to leave on pass 5. Neuropsych: This patient is capable of making decisions on her own behalf. 6. Skin/Wound Care: sternal wound cdi, can get wet.  7. Fluids/Electrolytes/Nutrition: routine I/Os and follow-up chemistries 8. Hypertension: BP stable, rate controlled, continue Pacerone, Norvasc, Lopressor             -bp controlled at present  12/11- BP controlled 130/70 this AM- con't regimen 9.Hyperlipidemia: continue Lipitor 10. Diabetes  mellitus type 2: continue insuline regimen and CBG checks             -12/10- CBGs adequate control- con't regimen 11. COPD: continue Anoro Ellipta (prescribe at discharge) 12. Tobacco use: provide nicotine patch, smoking cessation counseling 13. PUD/GERD: continue PPI 14.  Acute blood loss anemia/leukocytosis.  Follow-up chemistries             -no signs of blood loss on exam 15.  Dysphagia.   D/c Cortrak as per SLP  Provided education about infection risk if she continues to consume foods brought by family.  16. Foley             -has  been in for diuresis             -recent hematuria d/t foley trauma             -remove foley in AM tomorrow and begin voiding trial.  12/10- remove foley- and cath if volumes >350cc- ordered bladder scans.    - foley was removed before pt came over- got call from nurse at 1:53 pm- having hematuria again- will check labs in AM to make sure not large amount of blood loss- don't want to place foley again due to recent hx of trauma FROM a foley- and see if can go without placing- if it gets worse, will call Urology.   12/11- Hb 9.2 from 9.7- will recheck again in AM and if still drops, will need to call Urology.  17. Resp failure- continue O2 given desat to 68%at rest 18. Obesity BMI 30.66: provide dietary education 19. Leukocytosis: ttrending down, possibly 2/2 UTI: treat as below.  20. UTI: Keflex 500mg  BID ordered 21. Disposition: HFU scheduled   >30 minutes spent in discharge of patient including review of medications and follow-up appointments, physical examination, and in answering all patient's questions        LOS: 7 days A FACE TO FACE EVALUATION WAS [place] 02/01/2021, 9:55 AM

## 2021-02-01 NOTE — Progress Notes (Addendum)
Patient/husband provided with discharge instructions/medications reviewed by Lauraine Rinne, PA Staff assisted patient with personal belonging and provided transfer assistance into private car with no issues. Patient discharged.

## 2021-02-04 ENCOUNTER — Encounter: Payer: Self-pay | Admitting: Physical Medicine and Rehabilitation

## 2021-02-05 NOTE — Telephone Encounter (Signed)
Lm x2 for patient Will close encounter per office protocol. Letter mailed to address on file.    

## 2021-02-06 ENCOUNTER — Other Ambulatory Visit: Payer: Self-pay | Admitting: Nurse Practitioner

## 2021-02-06 DIAGNOSIS — M064 Inflammatory polyarthropathy: Secondary | ICD-10-CM

## 2021-02-07 NOTE — Telephone Encounter (Signed)
Med sent.

## 2021-02-12 DIAGNOSIS — E1151 Type 2 diabetes mellitus with diabetic peripheral angiopathy without gangrene: Secondary | ICD-10-CM | POA: Diagnosis not present

## 2021-02-12 DIAGNOSIS — D62 Acute posthemorrhagic anemia: Secondary | ICD-10-CM | POA: Diagnosis not present

## 2021-02-12 DIAGNOSIS — Z9181 History of falling: Secondary | ICD-10-CM | POA: Diagnosis not present

## 2021-02-12 DIAGNOSIS — R1312 Dysphagia, oropharyngeal phase: Secondary | ICD-10-CM | POA: Diagnosis not present

## 2021-02-12 DIAGNOSIS — I2510 Atherosclerotic heart disease of native coronary artery without angina pectoris: Secondary | ICD-10-CM | POA: Diagnosis not present

## 2021-02-12 DIAGNOSIS — I1310 Hypertensive heart and chronic kidney disease without heart failure, with stage 1 through stage 4 chronic kidney disease, or unspecified chronic kidney disease: Secondary | ICD-10-CM | POA: Diagnosis not present

## 2021-02-12 DIAGNOSIS — E1122 Type 2 diabetes mellitus with diabetic chronic kidney disease: Secondary | ICD-10-CM | POA: Diagnosis not present

## 2021-02-12 DIAGNOSIS — Z48812 Encounter for surgical aftercare following surgery on the circulatory system: Secondary | ICD-10-CM | POA: Diagnosis not present

## 2021-02-12 DIAGNOSIS — J189 Pneumonia, unspecified organism: Secondary | ICD-10-CM | POA: Diagnosis not present

## 2021-02-12 DIAGNOSIS — E669 Obesity, unspecified: Secondary | ICD-10-CM | POA: Diagnosis not present

## 2021-02-12 DIAGNOSIS — J9602 Acute respiratory failure with hypercapnia: Secondary | ICD-10-CM | POA: Diagnosis not present

## 2021-02-12 DIAGNOSIS — E039 Hypothyroidism, unspecified: Secondary | ICD-10-CM | POA: Diagnosis not present

## 2021-02-12 DIAGNOSIS — Z951 Presence of aortocoronary bypass graft: Secondary | ICD-10-CM | POA: Diagnosis not present

## 2021-02-12 DIAGNOSIS — F17210 Nicotine dependence, cigarettes, uncomplicated: Secondary | ICD-10-CM | POA: Diagnosis not present

## 2021-02-12 DIAGNOSIS — E7800 Pure hypercholesterolemia, unspecified: Secondary | ICD-10-CM | POA: Diagnosis not present

## 2021-02-12 DIAGNOSIS — F321 Major depressive disorder, single episode, moderate: Secondary | ICD-10-CM | POA: Diagnosis not present

## 2021-02-12 DIAGNOSIS — J9601 Acute respiratory failure with hypoxia: Secondary | ICD-10-CM | POA: Diagnosis not present

## 2021-02-12 DIAGNOSIS — I252 Old myocardial infarction: Secondary | ICD-10-CM | POA: Diagnosis not present

## 2021-02-12 DIAGNOSIS — J440 Chronic obstructive pulmonary disease with acute lower respiratory infection: Secondary | ICD-10-CM | POA: Diagnosis not present

## 2021-02-12 DIAGNOSIS — Z87440 Personal history of urinary (tract) infections: Secondary | ICD-10-CM | POA: Diagnosis not present

## 2021-02-12 DIAGNOSIS — Z8673 Personal history of transient ischemic attack (TIA), and cerebral infarction without residual deficits: Secondary | ICD-10-CM | POA: Diagnosis not present

## 2021-02-12 DIAGNOSIS — N189 Chronic kidney disease, unspecified: Secondary | ICD-10-CM | POA: Diagnosis not present

## 2021-02-12 DIAGNOSIS — G9340 Encephalopathy, unspecified: Secondary | ICD-10-CM | POA: Diagnosis not present

## 2021-02-12 DIAGNOSIS — Z6830 Body mass index (BMI) 30.0-30.9, adult: Secondary | ICD-10-CM | POA: Diagnosis not present

## 2021-02-12 DIAGNOSIS — F410 Panic disorder [episodic paroxysmal anxiety] without agoraphobia: Secondary | ICD-10-CM | POA: Diagnosis not present

## 2021-02-13 ENCOUNTER — Telehealth: Payer: Self-pay | Admitting: *Deleted

## 2021-02-13 ENCOUNTER — Telehealth: Payer: Self-pay

## 2021-02-13 NOTE — Telephone Encounter (Signed)
Brianne called to get verbal orders for PT. 1x/week for 4 weeks.   Brianne also stated for FYI the patient declined OT and speech therapy.

## 2021-02-13 NOTE — Telephone Encounter (Signed)
Patient contacted the office requesting a refill of oxycodone. Patient is s/p CABG 12/2 by Dr. Roxan Hockey. Per patient, she describes pain as incisional and midline. Pain seems to worsen at night. Patient states she has been taking oxycodone every 4-6 hours. Patient has prescription for Tramadol but states she has not taken for her pain. Per E. Barrett, PA, patient advised to resume taking prescription of tramadol as prescribed by another provider on 12/22. Patient advised she may also incorporate Tylenol into her pain regimen. Patient advised to call our office if she has any further questions.

## 2021-02-13 NOTE — Telephone Encounter (Signed)
Brianne notified of PT orders

## 2021-02-17 ENCOUNTER — Other Ambulatory Visit: Payer: Self-pay | Admitting: Nurse Practitioner

## 2021-02-17 DIAGNOSIS — M7540 Impingement syndrome of unspecified shoulder: Secondary | ICD-10-CM

## 2021-02-18 ENCOUNTER — Other Ambulatory Visit: Payer: Self-pay

## 2021-02-19 ENCOUNTER — Other Ambulatory Visit: Payer: Self-pay | Admitting: Thoracic Surgery (Cardiothoracic Vascular Surgery)

## 2021-02-19 DIAGNOSIS — Z951 Presence of aortocoronary bypass graft: Secondary | ICD-10-CM | POA: Diagnosis not present

## 2021-02-19 DIAGNOSIS — G9340 Encephalopathy, unspecified: Secondary | ICD-10-CM | POA: Diagnosis not present

## 2021-02-19 DIAGNOSIS — D62 Acute posthemorrhagic anemia: Secondary | ICD-10-CM | POA: Diagnosis not present

## 2021-02-19 DIAGNOSIS — I252 Old myocardial infarction: Secondary | ICD-10-CM | POA: Diagnosis not present

## 2021-02-19 DIAGNOSIS — I2510 Atherosclerotic heart disease of native coronary artery without angina pectoris: Secondary | ICD-10-CM | POA: Diagnosis not present

## 2021-02-19 DIAGNOSIS — Z48812 Encounter for surgical aftercare following surgery on the circulatory system: Secondary | ICD-10-CM | POA: Diagnosis not present

## 2021-02-19 NOTE — Telephone Encounter (Signed)
Med sent to pharmacy.

## 2021-02-22 ENCOUNTER — Ambulatory Visit (INDEPENDENT_AMBULATORY_CARE_PROVIDER_SITE_OTHER): Payer: Medicare Other

## 2021-02-22 ENCOUNTER — Other Ambulatory Visit: Payer: Self-pay

## 2021-02-22 ENCOUNTER — Ambulatory Visit (INDEPENDENT_AMBULATORY_CARE_PROVIDER_SITE_OTHER): Payer: Medicare Other | Admitting: Podiatry

## 2021-02-22 DIAGNOSIS — L97521 Non-pressure chronic ulcer of other part of left foot limited to breakdown of skin: Secondary | ICD-10-CM

## 2021-02-22 DIAGNOSIS — L84 Corns and callosities: Secondary | ICD-10-CM

## 2021-02-22 NOTE — Progress Notes (Signed)
HPI: 61 y.o. female presenting today with her husband for evaluation of a symptomatic preulcerative callus to the outside of the fifth toe left foot.  Patient has history of PVD, CKD, T2DM and recently past surgical history of quadruple bypass and stent placement left lower extremity.  She says that over the past few weeks she developed pain and tenderness to the outside of the left foot.  She presents for further treatment and evaluation  Past Medical History:  Diagnosis Date   Anxiety    Arthritis    Bilateral carotid artery stenosis 2014   Carotid artery occlusion    Chronic kidney disease May 2017   UTI   Chronic kidney disease 2017   Current smoker    CVA (cerebral vascular accident) ([place]) 2013   Depression    Diabetes ([place])    Diverticulosis    Fatty liver    Fibromyalgia    GERD (gastroesophageal reflux disease)    H/O hiatal hernia    Hiatal hernia    Hypercholesteremia    Hypertension    IBS (irritable bowel syndrome)    PAD (peripheral artery disease) ([place])    Peptic ulcer    Plantar fasciitis    Stroke [facility]) Dec. 14,2013   Right side-ministroke   T2DM (type 2 diabetes mellitus) [facility])     Past Surgical History:  Procedure Laterality Date   ABDOMINAL HYSTERECTOMY  1990   BACK SURGERY  2000, 2004   CAROTID ENDARTERECTOMY Left 05/06/12   CARPAL TUNNEL RELEASE Left 07/18/2015   Procedure: CARPAL TUNNEL RELEASE;  Surgeon: Earnestine Leys, MD;  Location: [facility] ORS;  Service: Orthopedics;  Laterality: Left;   CEREBRAL ANGIOGRAM Bilateral 05/03/2012   Procedure: CEREBRAL ANGIOGRAM;  Surgeon: Angelia Mould, MD;  Location: [facility] CATH LAB;  Service: Cardiovascular;  Laterality: Bilateral;   CHOLECYSTECTOMY  2001   COLONOSCOPY WITH PROPOFOL N/A 11/19/2018   Procedure: COLONOSCOPY WITH PROPOFOL;  Surgeon: Lucilla Lame, MD;  Location: [place];  Service: Endoscopy;  Laterality: N/A;  Diabetic - insulin   CORONARY ARTERY BYPASS GRAFT N/A 01/18/2021   Procedure:  CORONARY ARTERY BYPASS GRAFTING (CABG) x 4  USING LEFT INTERNAL MAMMARY ARTERY AND LEFT ENDOSCOPIC GREATER SAPHENOUS VEIN CONDUITS;  Surgeon: [name], MD;  Location: [place];  Service: Open Heart Surgery;  Laterality: N/A;   CORONARY/GRAFT ACUTE MI REVASCULARIZATION N/A 01/13/2021   Procedure: Coronary/Graft Acute MI Revascularization;  Surgeon: Isaias Cowman, MD;  Location: [place] CV LAB;  Service: Cardiovascular;  Laterality: N/A;   ENDARTERECTOMY Left 05/06/2012   Procedure: ENDARTERECTOMY CAROTID;  Surgeon: Angelia Mould, MD;  Location: [place];  Service: Vascular;  Laterality: Left;   ENDARTERECTOMY Right 08/09/2013   Procedure: ENDARTERECTOMY CAROTID-RIGHT;  Surgeon: Angelia Mould, MD;  Location: [place];  Service: Vascular;  Laterality: Right;   ENDOVEIN HARVEST OF GREATER SAPHENOUS VEIN Left 01/18/2021   Procedure: ENDOVEIN HARVEST OF GREATER SAPHENOUS VEIN;  Surgeon: [name], MD;  Location: [place];  Service: Open Heart Surgery;  Laterality: Left;   HERNIA REPAIR     IABP INSERTION Right 01/13/2021   Procedure: IABP Insertion;  Surgeon: Isaias Cowman, MD;  Location: [place] CV LAB;  Service: Cardiovascular;  Laterality: Right;   LEFT HEART CATH AND CORONARY ANGIOGRAPHY N/A 01/13/2021   Procedure: LEFT HEART CATH AND CORONARY ANGIOGRAPHY;  Surgeon: Isaias Cowman, MD;  Location: [place] CV LAB;  Service: Cardiovascular;  Laterality: N/A;   LOWER EXTREMITY ANGIOGRAPHY Left 01/03/2021   Procedure:  LOWER EXTREMITY ANGIOGRAPHY;  Surgeon: Algernon Huxley, MD;  Location: [place] CV LAB;  Service: Cardiovascular;  Laterality: Left;   PATCH ANGIOPLASTY Left 05/06/2012   Procedure: WITH DACRON PATCH ANGIOPLASTY ;  Surgeon: Angelia Mould, MD;  Location: [place];  Service: Vascular;  Laterality: Left;   POLYPECTOMY  11/19/2018   Procedure: POLYPECTOMY;  Surgeon: Lucilla Lame, MD;  Location: [place];  Service:  Endoscopy;;   SPINE SURGERY  2004   TEE WITHOUT CARDIOVERSION N/A 01/18/2021   Procedure: TRANSESOPHAGEAL ECHOCARDIOGRAM (TEE);  Surgeon: [name], MD;  Location: [place];  Service: Open Heart Surgery;  Laterality: N/A;   TONSILLECTOMY     TUBAL LIGATION      Allergies  Allergen Reactions   Simvastatin Diarrhea and Nausea And Vomiting   Simvastatin     Diarrhea, nausea,vomiting   Morphine And Related Itching   Lactose Intolerance (Gi) Diarrhea   Milk-Related Compounds Diarrhea    bloating   Morphine And Related Itching   Latex Rash    Rash when she wears gloves (Negative by test - per pt)   Latex       Physical Exam: General: The patient is alert and oriented x3 in no acute distress.  Dermatology: Currently there is no open wound to the left foot.  Discoloration along the toes and plantar surface of the foot noted.  There is also some hemorrhagic discoloration overlying the fifth MTP joint however this is not open and there is no drainage.  It appears somewhat stable.  Vascular: PAD.  Closely monitored by Dr. Lucky Cowboy, [place] [facility].  Lower extremity angiography left on 01/03/2021.  Neurological: Light touch and protective threshold grossly intact  Musculoskeletal Exam: No pedal deformities noted  Assessment: 1.  PAD LLE 2.  Preulcerative callus lesion left foot   Plan of Care:  1. Patient evaluated. 2.  Explained to the patient that I do believe this is simply vascular related.  There is no open wound so we will simply monitor for now.  Recommend follow-up with vascular 3.  Return to clinic as needed      Edrick Kins, DPM [facility]  Dr. Edrick Kins, DPM    [address], [place] [address]                Office [phone]  Fax [phone]

## 2021-02-25 ENCOUNTER — Telehealth (INDEPENDENT_AMBULATORY_CARE_PROVIDER_SITE_OTHER): Payer: Self-pay

## 2021-02-25 NOTE — Telephone Encounter (Signed)
She was supposed to have a follow up on 12/8 however she no showed the appt due to being in the hospital it seems.  Bring her in with ABIs to see me or dew

## 2021-02-26 ENCOUNTER — Ambulatory Visit (INDEPENDENT_AMBULATORY_CARE_PROVIDER_SITE_OTHER): Payer: Medicare Other

## 2021-02-26 ENCOUNTER — Telehealth (INDEPENDENT_AMBULATORY_CARE_PROVIDER_SITE_OTHER): Payer: Self-pay

## 2021-02-26 ENCOUNTER — Encounter (INDEPENDENT_AMBULATORY_CARE_PROVIDER_SITE_OTHER): Payer: Self-pay | Admitting: Vascular Surgery

## 2021-02-26 ENCOUNTER — Telehealth: Payer: Self-pay

## 2021-02-26 ENCOUNTER — Other Ambulatory Visit: Payer: Self-pay

## 2021-02-26 ENCOUNTER — Other Ambulatory Visit (INDEPENDENT_AMBULATORY_CARE_PROVIDER_SITE_OTHER): Payer: Self-pay | Admitting: Nurse Practitioner

## 2021-02-26 ENCOUNTER — Ambulatory Visit: Payer: Medicare Other | Admitting: Thoracic Surgery (Cardiothoracic Vascular Surgery)

## 2021-02-26 ENCOUNTER — Ambulatory Visit (INDEPENDENT_AMBULATORY_CARE_PROVIDER_SITE_OTHER): Payer: Medicare Other | Admitting: Vascular Surgery

## 2021-02-26 ENCOUNTER — Other Ambulatory Visit (INDEPENDENT_AMBULATORY_CARE_PROVIDER_SITE_OTHER): Payer: Self-pay | Admitting: Vascular Surgery

## 2021-02-26 VITALS — BP 173/79 | HR 91 | Resp 18 | Ht 67.0 in | Wt 182.0 lb

## 2021-02-26 DIAGNOSIS — I739 Peripheral vascular disease, unspecified: Secondary | ICD-10-CM | POA: Diagnosis not present

## 2021-02-26 DIAGNOSIS — Z9889 Other specified postprocedural states: Secondary | ICD-10-CM

## 2021-02-26 DIAGNOSIS — Z95820 Peripheral vascular angioplasty status with implants and grafts: Secondary | ICD-10-CM

## 2021-02-26 DIAGNOSIS — I70229 Atherosclerosis of native arteries of extremities with rest pain, unspecified extremity: Secondary | ICD-10-CM | POA: Insufficient documentation

## 2021-02-26 DIAGNOSIS — I70212 Atherosclerosis of native arteries of extremities with intermittent claudication, left leg: Secondary | ICD-10-CM

## 2021-02-26 DIAGNOSIS — I70222 Atherosclerosis of native arteries of extremities with rest pain, left leg: Secondary | ICD-10-CM

## 2021-02-26 DIAGNOSIS — E1165 Type 2 diabetes mellitus with hyperglycemia: Secondary | ICD-10-CM

## 2021-02-26 DIAGNOSIS — I10 Essential (primary) hypertension: Secondary | ICD-10-CM | POA: Diagnosis not present

## 2021-02-26 DIAGNOSIS — Z794 Long term (current) use of insulin: Secondary | ICD-10-CM

## 2021-02-26 DIAGNOSIS — E7800 Pure hypercholesterolemia, unspecified: Secondary | ICD-10-CM | POA: Diagnosis not present

## 2021-02-26 NOTE — Assessment & Plan Note (Signed)
blood glucose control important in reducing the progression of atherosclerotic disease. Also, involved in wound healing. On appropriate medications.  

## 2021-02-26 NOTE — Telephone Encounter (Signed)
Spoke with the patient and gave her the information about her left leg angio on 02/28/21 with a 9:45 am arrival time to the [facility] with Dr. Lucky Cowboy. Patient was given the pre-procedure instructions and stated she understood.,

## 2021-02-26 NOTE — Assessment & Plan Note (Addendum)
Her noninvasive studies today show occlusion of her left SFA stents with an ABI of 0.7 on the left but her digit pressure is only 19 on the left.  Her right ABI is 1.0 with pretty good waveforms. This is now a critical and limb threatening situation.  She has occluded her previous intervention now has rest pain.  She will continue her anticoagulants.  We will get her on the schedule this week for an angiogram with hopes of revascularization or at least delineating the anatomy for bypass surgery if that is what is required.  Risks and benefits were discussed with the patient she is agreeable to proceed.

## 2021-02-26 NOTE — Telephone Encounter (Signed)
Patient contacted the office requesting refill of pain medication. She is s/p CABG x4 with Dr. Roxan Hockey 01/18/21. She was sent home with oxycodone from inpatient rehab and contacted our office 02/13/21 requesting refill. She was told that she had recently received a prescription of Tramadol from her PCP of #90 to be taken TID and that she would not get another refill and she should continue to take what was already prescribed. She called back today requesting another refill of pain medication stating that she is out. When asked about prescription from her PCP she states that she is out of those as well. Advised that she would need to contact her PCP for another refill as we will not be refilling that for her. She acknowledged receipt.

## 2021-02-26 NOTE — Progress Notes (Signed)
MRN : [id number]  Morgan Daniels is a 61 y.o. (03-18-1960) female who presents with chief complaint of  Chief Complaint  Patient presents with   Follow-up    ultrasound  .  History of Present Illness: Patient returns today in follow up of her PAD.  Her left foot has not been bothering her for a couple of weeks.  After her last intervention in mid November, she went on to have cardiac issues and cardiopulmonary bypass with coronary artery bypass grafting.  As she began walking more from her recovery from that, she noticed her left foot was hurting more.  It was also cooler.  Her noninvasive studies today show occlusion of her left SFA stents with an ABI of 0.7 on the left but her digit pressure is only 19 on the left.  Her right ABI is 1.0 with pretty good waveforms.  Current Outpatient Medications  Medication Sig Dispense Refill   amiodarone (PACERONE) 200 MG tablet Take 1 tablet (200 mg total) by mouth daily. 30 tablet 0   amLODipine (NORVASC) 10 MG tablet Take 1 tablet (10 mg total) by mouth daily. 30 tablet 0   aspirin 81 MG chewable tablet Chew 1 tablet (81 mg total) by mouth daily.     atorvastatin (LIPITOR) 80 MG tablet Take 1 tablet (80 mg total) by mouth daily. 30 tablet 0   cephALEXin (KEFLEX) 500 MG capsule Take 1 capsule (500 mg total) by mouth every 12 (twelve) hours. 8 capsule 0   Cholecalciferol (VITAMIN D-3) 125 MCG (5000 UT) TABS Take 5,000 Units by mouth daily. 30 tablet 0   clopidogrel (PLAVIX) 75 MG tablet Take 1 tablet (75 mg total) by mouth daily. 30 tablet 11   Continuous Blood Gluc Receiver (FREESTYLE LIBRE 2 READER) DEVI As directed 1 each 0   Continuous Blood Gluc Sensor (FREESTYLE LIBRE 2 SENSOR) MISC USE TO TEST BLOOD GLUCOSE 4 TIMES A DAY . DX E11.65 2 each 3   FLUoxetine (PROZAC) 40 MG capsule Take 2 capsules (80 mg total) by mouth daily. 30 capsule 3   gabapentin (NEURONTIN) 600 MG tablet Take 1 tablet (600 mg total) by mouth 3 (three) times daily. 90 tablet 0    insulin degludec (TRESIBA FLEXTOUCH) 100 UNIT/ML FlexTouch Pen Inject 40 Units into the skin daily. 3 mL 0   metoprolol tartrate (LOPRESSOR) 25 MG tablet Take 1 tablet (25 mg total) by mouth 2 (two) times daily. 60 tablet 0   Multiple Vitamin (MULTIVITAMIN WITH MINERALS) TABS tablet Take 1 tablet by mouth daily.     oxyCODONE (OXY IR/ROXICODONE) 5 MG immediate release tablet Take 1 tablet (5 mg total) by mouth every 4 (four) hours as needed for severe pain. 30 tablet 0   pantoprazole sodium (PROTONIX) 40 mg Take 40 mg by mouth daily. 30 packet 0   saccharomyces boulardii (FLORASTOR) 250 MG capsule Take 1 capsule (250 mg total) by mouth 2 (two) times daily. 60 capsule 0   tiZANidine (ZANAFLEX) 4 MG tablet TAKE 1 TABLET(S) BY MOUTH TWICE A DAY AS NEEDED FOR MUSCLE PAIN AND SPASMS 60 tablet 1   traMADol (ULTRAM) 50 MG tablet TAKE 1 TABLET BY MOUTH 3 TIMES DAILY AS NEEDED FOR MODERATE PAIN OR SEVERE PAIN. 90 tablet 0   umeclidinium-vilanterol (ANORO ELLIPTA) 62.5-25 MCG/ACT AEPB Inhale 1 puff into the lungs daily. 3 each 1   nicotine (NICODERM CQ - DOSED IN MG/24 HOURS) 14 mg/24hr patch 14 mg patch daily x2 weeks then 7 mg  patch daily x3 weeks and stop 28 patch 0   No current facility-administered medications for this visit.    Past Medical History:  Diagnosis Date   Anxiety    Arthritis    Bilateral carotid artery stenosis 2014   Carotid artery occlusion    Chronic kidney disease May 2017   UTI   Chronic kidney disease 2017   Current smoker    CVA (cerebral vascular accident) ([place]) 2013   Depression    Diabetes ([place])    Diverticulosis    Fatty liver    Fibromyalgia    GERD (gastroesophageal reflux disease)    H/O hiatal hernia    Hiatal hernia    Hypercholesteremia    Hypertension    IBS (irritable bowel syndrome)    PAD (peripheral artery disease) ([place])    Peptic ulcer    Plantar fasciitis    Stroke [facility]) Dec. 14,2013   Right side-ministroke   T2DM (type 2 diabetes mellitus)  [facility])     Past Surgical History:  Procedure Laterality Date   ABDOMINAL HYSTERECTOMY  1990   BACK SURGERY  2000, 2004   CAROTID ENDARTERECTOMY Left 05/06/12   CARPAL TUNNEL RELEASE Left 07/18/2015   Procedure: CARPAL TUNNEL RELEASE;  Surgeon: Earnestine Leys, MD;  Location: [facility] ORS;  Service: Orthopedics;  Laterality: Left;   CEREBRAL ANGIOGRAM Bilateral 05/03/2012   Procedure: CEREBRAL ANGIOGRAM;  Surgeon: Angelia Mould, MD;  Location: [facility] CATH LAB;  Service: Cardiovascular;  Laterality: Bilateral;   CHOLECYSTECTOMY  2001   COLONOSCOPY WITH PROPOFOL N/A 11/19/2018   Procedure: COLONOSCOPY WITH PROPOFOL;  Surgeon: Lucilla Lame, MD;  Location: [place];  Service: Endoscopy;  Laterality: N/A;  Diabetic - insulin   CORONARY ARTERY BYPASS GRAFT N/A 01/18/2021   Procedure: CORONARY ARTERY BYPASS GRAFTING (CABG) x 4  USING LEFT INTERNAL MAMMARY ARTERY AND LEFT ENDOSCOPIC GREATER SAPHENOUS VEIN CONDUITS;  Surgeon: [name], MD;  Location: [place];  Service: Open Heart Surgery;  Laterality: N/A;   CORONARY/GRAFT ACUTE MI REVASCULARIZATION N/A 01/13/2021   Procedure: Coronary/Graft Acute MI Revascularization;  Surgeon: Isaias Cowman, MD;  Location: [place] CV LAB;  Service: Cardiovascular;  Laterality: N/A;   ENDARTERECTOMY Left 05/06/2012   Procedure: ENDARTERECTOMY CAROTID;  Surgeon: Angelia Mould, MD;  Location: [place];  Service: Vascular;  Laterality: Left;   ENDARTERECTOMY Right 08/09/2013   Procedure: ENDARTERECTOMY CAROTID-RIGHT;  Surgeon: Angelia Mould, MD;  Location: [place];  Service: Vascular;  Laterality: Right;   ENDOVEIN HARVEST OF GREATER SAPHENOUS VEIN Left 01/18/2021   Procedure: ENDOVEIN HARVEST OF GREATER SAPHENOUS VEIN;  Surgeon: [name], MD;  Location: [place];  Service: Open Heart Surgery;  Laterality: Left;   HERNIA REPAIR     IABP INSERTION Right 01/13/2021   Procedure: IABP Insertion;  Surgeon: Isaias Cowman,  MD;  Location: [place] CV LAB;  Service: Cardiovascular;  Laterality: Right;   LEFT HEART CATH AND CORONARY ANGIOGRAPHY N/A 01/13/2021   Procedure: LEFT HEART CATH AND CORONARY ANGIOGRAPHY;  Surgeon: Isaias Cowman, MD;  Location: [place] CV LAB;  Service: Cardiovascular;  Laterality: N/A;   LOWER EXTREMITY ANGIOGRAPHY Left 01/03/2021   Procedure: LOWER EXTREMITY ANGIOGRAPHY;  Surgeon: Algernon Huxley, MD;  Location: [place] CV LAB;  Service: Cardiovascular;  Laterality: Left;   PATCH ANGIOPLASTY Left 05/06/2012   Procedure: WITH DACRON PATCH ANGIOPLASTY ;  Surgeon: Angelia Mould, MD;  Location: [place];  Service: Vascular;  Laterality: Left;   POLYPECTOMY  11/19/2018  Procedure: POLYPECTOMY;  Surgeon: Lucilla Lame, MD;  Location: [place];  Service: Endoscopy;;   SPINE SURGERY  2004   TEE WITHOUT CARDIOVERSION N/A 01/18/2021   Procedure: TRANSESOPHAGEAL ECHOCARDIOGRAM (TEE);  Surgeon: [name], MD;  Location: [place];  Service: Open Heart Surgery;  Laterality: N/A;   TONSILLECTOMY     TUBAL LIGATION       Social History   Tobacco Use   Smoking status: Former    Packs/day: 1.00    Years: 40.00    Pack years: 40.00    Types: Cigarettes    Start date: 11/09/1970    Passive exposure: Current   Smokeless tobacco: Never   Tobacco comments:       Vaping Use   Vaping Use: Former  Substance Use Topics   Alcohol use: Yes    Alcohol/week: 0.0 standard drinks    Comment: rarely   Drug use: No       Family History  Problem Relation Age of Onset   Hypertension Mother    Hyperlipidemia Mother    Deep vein thrombosis Mother    Cancer Father    Alcohol abuse Father    Heart failure Father    Hypertension Maternal Grandmother    Deep vein thrombosis Sister    Alcohol abuse Sister    Alcohol abuse Sister      Allergies  Allergen Reactions   Simvastatin Diarrhea and Nausea And Vomiting   Simvastatin     Diarrhea, nausea,vomiting    Morphine And Related Itching   Lactose Intolerance (Gi) Diarrhea   Milk-Related Compounds Diarrhea    bloating   Morphine And Related Itching   Latex Rash    Rash when she wears gloves (Negative by test - per pt)   Latex      REVIEW OF SYSTEMS (Negative unless checked)  Constitutional: [] Weight loss  [] Fever  [] Chills Cardiac: [] Chest pain   [] Chest pressure   [] Palpitations   [] Shortness of breath when laying flat   [] Shortness of breath at rest   [x] Shortness of breath with exertion. Vascular:  [x] Pain in legs with walking   [] Pain in legs at rest   [] Pain in legs when laying flat   [] Claudication   [] Pain in feet when walking  [x] Pain in feet at rest  [] Pain in feet when laying flat   [] History of DVT   [] Phlebitis   [] Swelling in legs   [] Varicose veins   [] Non-healing ulcers Pulmonary:   [] Uses home oxygen   [] Productive cough   [] Hemoptysis   [] Wheeze  [] COPD   [] Asthma Neurologic:  [] Dizziness  [] Blackouts   [] Seizures   [] History of stroke   [] History of TIA  [] Aphasia   [] Temporary blindness   [] Dysphagia   [] Weakness or numbness in arms   [] Weakness or numbness in legs Musculoskeletal:  [] Arthritis   [] Joint swelling   [] Joint pain   [] Low back pain Hematologic:  [] Easy bruising  [] Easy bleeding   [] Hypercoagulable state   [] Anemic   Gastrointestinal:  [] Blood in stool   [] Vomiting blood  [] Gastroesophageal reflux/heartburn   [] Abdominal pain Genitourinary:  [] Chronic kidney disease   [] Difficult urination  [] Frequent urination  [] Burning with urination   [] Hematuria Skin:  [] Rashes   [] Ulcers   [] Wounds Psychological:  [] History of anxiety   []  History of major depression.  Physical Examination  BP (!) 173/79 (BP Location: Left Arm)    Pulse 91    Resp 18    Ht 5\' 7"  (  1.702 m)    Wt 182 lb (82.6 kg)    BMI 28.51 kg/m  Gen:  WD/WN, NAD Head: [place]/AT, No temporalis wasting. Ear/Nose/Throat: Hearing grossly intact, nares w/o erythema or drainage Eyes: Conjunctiva clear.  Sclera non-icteric Neck: Supple.  Trachea midline Pulmonary:  Good air movement, no use of accessory muscles.  Cardiac: RRR, no JVD Vascular:  Vessel Right Left  Radial Palpable Palpable                          PT 2+ palpable 1+ palpable  DP 1+ Palpable Not palpable   Gastrointestinal: soft, non-tender/non-distended. No guarding/reflex.  Musculoskeletal: M/S 5/5 throughout.  No deformity or atrophy.  Trace bilateral lower extremity edema. Neurologic: Sensation grossly intact in extremities.  Symmetrical.  Speech is fluent.  Psychiatric: Judgment intact, Mood & affect appropriate for pt's clinical situation. Dermatologic: No rashes or ulcers noted.  No cellulitis or open wounds.      Labs   Radiology   Assessment/Plan  Atherosclerosis of native arteries of extremity with rest pain ([place]) Her noninvasive studies today show occlusion of her left SFA stents with an ABI of 0.7 on the left but her digit pressure is only 19 on the left.  Her right ABI is 1.0 with pretty good waveforms. This is now a critical and limb threatening situation.  She has occluded her previous intervention now has rest pain.  She will continue her anticoagulants.  We will get her on the schedule this week for an angiogram with hopes of revascularization or at least delineating the anatomy for bypass surgery if that is what is required.  Risks and benefits were discussed with the patient she is agreeable to proceed.  Essential hypertension blood pressure control important in reducing the progression of atherosclerotic disease. On appropriate oral medications.   Type 2 diabetes mellitus with hyperglycemia, with long-term current use of insulin (HCC) blood glucose control important in reducing the progression of atherosclerotic disease. Also, involved in wound healing. On appropriate medications.   Hypercholesteremia lipid control important in reducing the progression of atherosclerotic disease.  Continue statin therapy     Leotis Pain, MD  02/26/2021 3:35 PM    This note was created with Dragon medical transcription system.  Any errors from dictation are purely unintentional

## 2021-02-26 NOTE — H&P (View-Only) (Signed)
MRN : [id number]  Morgan Daniels is a 61 y.o. (August 24, 1960) female who presents with chief complaint of  Chief Complaint  Patient presents with   Follow-up    ultrasound  .  History of Present Illness: Patient returns today in follow up of her PAD.  Her left foot has not been bothering her for a couple of weeks.  After her last intervention in mid November, she went on to have cardiac issues and cardiopulmonary bypass with coronary artery bypass grafting.  As she began walking more from her recovery from that, she noticed her left foot was hurting more.  It was also cooler.  Her noninvasive studies today show occlusion of her left SFA stents with an ABI of 0.7 on the left but her digit pressure is only 19 on the left.  Her right ABI is 1.0 with pretty good waveforms.  Current Outpatient Medications  Medication Sig Dispense Refill   amiodarone (PACERONE) 200 MG tablet Take 1 tablet (200 mg total) by mouth daily. 30 tablet 0   amLODipine (NORVASC) 10 MG tablet Take 1 tablet (10 mg total) by mouth daily. 30 tablet 0   aspirin 81 MG chewable tablet Chew 1 tablet (81 mg total) by mouth daily.     atorvastatin (LIPITOR) 80 MG tablet Take 1 tablet (80 mg total) by mouth daily. 30 tablet 0   cephALEXin (KEFLEX) 500 MG capsule Take 1 capsule (500 mg total) by mouth every 12 (twelve) hours. 8 capsule 0   Cholecalciferol (VITAMIN D-3) 125 MCG (5000 UT) TABS Take 5,000 Units by mouth daily. 30 tablet 0   clopidogrel (PLAVIX) 75 MG tablet Take 1 tablet (75 mg total) by mouth daily. 30 tablet 11   Continuous Blood Gluc Receiver (FREESTYLE LIBRE 2 READER) DEVI As directed 1 each 0   Continuous Blood Gluc Sensor (FREESTYLE LIBRE 2 SENSOR) MISC USE TO TEST BLOOD GLUCOSE 4 TIMES A DAY . DX E11.65 2 each 3   FLUoxetine (PROZAC) 40 MG capsule Take 2 capsules (80 mg total) by mouth daily. 30 capsule 3   gabapentin (NEURONTIN) 600 MG tablet Take 1 tablet (600 mg total) by mouth 3 (three) times daily. 90 tablet 0    insulin degludec (TRESIBA FLEXTOUCH) 100 UNIT/ML FlexTouch Pen Inject 40 Units into the skin daily. 3 mL 0   metoprolol tartrate (LOPRESSOR) 25 MG tablet Take 1 tablet (25 mg total) by mouth 2 (two) times daily. 60 tablet 0   Multiple Vitamin (MULTIVITAMIN WITH MINERALS) TABS tablet Take 1 tablet by mouth daily.     oxyCODONE (OXY IR/ROXICODONE) 5 MG immediate release tablet Take 1 tablet (5 mg total) by mouth every 4 (four) hours as needed for severe pain. 30 tablet 0   pantoprazole sodium (PROTONIX) 40 mg Take 40 mg by mouth daily. 30 packet 0   saccharomyces boulardii (FLORASTOR) 250 MG capsule Take 1 capsule (250 mg total) by mouth 2 (two) times daily. 60 capsule 0   tiZANidine (ZANAFLEX) 4 MG tablet TAKE 1 TABLET(S) BY MOUTH TWICE A DAY AS NEEDED FOR MUSCLE PAIN AND SPASMS 60 tablet 1   traMADol (ULTRAM) 50 MG tablet TAKE 1 TABLET BY MOUTH 3 TIMES DAILY AS NEEDED FOR MODERATE PAIN OR SEVERE PAIN. 90 tablet 0   umeclidinium-vilanterol (ANORO ELLIPTA) 62.5-25 MCG/ACT AEPB Inhale 1 puff into the lungs daily. 3 each 1   nicotine (NICODERM CQ - DOSED IN MG/24 HOURS) 14 mg/24hr patch 14 mg patch daily x2 weeks then 7 mg  patch daily x3 weeks and stop 28 patch 0   No current facility-administered medications for this visit.    Past Medical History:  Diagnosis Date   Anxiety    Arthritis    Bilateral carotid artery stenosis 2014   Carotid artery occlusion    Chronic kidney disease May 2017   UTI   Chronic kidney disease 2017   Current smoker    CVA (cerebral vascular accident) ([place]) 2013   Depression    Diabetes ([place])    Diverticulosis    Fatty liver    Fibromyalgia    GERD (gastroesophageal reflux disease)    H/O hiatal hernia    Hiatal hernia    Hypercholesteremia    Hypertension    IBS (irritable bowel syndrome)    PAD (peripheral artery disease) ([place])    Peptic ulcer    Plantar fasciitis    Stroke [facility]) Dec. 14,2013   Right side-ministroke   T2DM (type 2 diabetes mellitus)  [facility])     Past Surgical History:  Procedure Laterality Date   ABDOMINAL HYSTERECTOMY  1990   BACK SURGERY  2000, 2004   CAROTID ENDARTERECTOMY Left 05/06/12   CARPAL TUNNEL RELEASE Left 07/18/2015   Procedure: CARPAL TUNNEL RELEASE;  Surgeon: Earnestine Leys, MD;  Location: [facility] ORS;  Service: Orthopedics;  Laterality: Left;   CEREBRAL ANGIOGRAM Bilateral 05/03/2012   Procedure: CEREBRAL ANGIOGRAM;  Surgeon: Angelia Mould, MD;  Location: [facility] CATH LAB;  Service: Cardiovascular;  Laterality: Bilateral;   CHOLECYSTECTOMY  2001   COLONOSCOPY WITH PROPOFOL N/A 11/19/2018   Procedure: COLONOSCOPY WITH PROPOFOL;  Surgeon: Lucilla Lame, MD;  Location: [place];  Service: Endoscopy;  Laterality: N/A;  Diabetic - insulin   CORONARY ARTERY BYPASS GRAFT N/A 01/18/2021   Procedure: CORONARY ARTERY BYPASS GRAFTING (CABG) x 4  USING LEFT INTERNAL MAMMARY ARTERY AND LEFT ENDOSCOPIC GREATER SAPHENOUS VEIN CONDUITS;  Surgeon: [name], MD;  Location: [place];  Service: Open Heart Surgery;  Laterality: N/A;   CORONARY/GRAFT ACUTE MI REVASCULARIZATION N/A 01/13/2021   Procedure: Coronary/Graft Acute MI Revascularization;  Surgeon: Isaias Cowman, MD;  Location: [place] CV LAB;  Service: Cardiovascular;  Laterality: N/A;   ENDARTERECTOMY Left 05/06/2012   Procedure: ENDARTERECTOMY CAROTID;  Surgeon: Angelia Mould, MD;  Location: [place];  Service: Vascular;  Laterality: Left;   ENDARTERECTOMY Right 08/09/2013   Procedure: ENDARTERECTOMY CAROTID-RIGHT;  Surgeon: Angelia Mould, MD;  Location: [place];  Service: Vascular;  Laterality: Right;   ENDOVEIN HARVEST OF GREATER SAPHENOUS VEIN Left 01/18/2021   Procedure: ENDOVEIN HARVEST OF GREATER SAPHENOUS VEIN;  Surgeon: [name], MD;  Location: [place];  Service: Open Heart Surgery;  Laterality: Left;   HERNIA REPAIR     IABP INSERTION Right 01/13/2021   Procedure: IABP Insertion;  Surgeon: Isaias Cowman,  MD;  Location: [place] CV LAB;  Service: Cardiovascular;  Laterality: Right;   LEFT HEART CATH AND CORONARY ANGIOGRAPHY N/A 01/13/2021   Procedure: LEFT HEART CATH AND CORONARY ANGIOGRAPHY;  Surgeon: Isaias Cowman, MD;  Location: [place] CV LAB;  Service: Cardiovascular;  Laterality: N/A;   LOWER EXTREMITY ANGIOGRAPHY Left 01/03/2021   Procedure: LOWER EXTREMITY ANGIOGRAPHY;  Surgeon: Algernon Huxley, MD;  Location: [place] CV LAB;  Service: Cardiovascular;  Laterality: Left;   PATCH ANGIOPLASTY Left 05/06/2012   Procedure: WITH DACRON PATCH ANGIOPLASTY ;  Surgeon: Angelia Mould, MD;  Location: [place];  Service: Vascular;  Laterality: Left;   POLYPECTOMY  11/19/2018  Procedure: POLYPECTOMY;  Surgeon: Lucilla Lame, MD;  Location: [place];  Service: Endoscopy;;   SPINE SURGERY  2004   TEE WITHOUT CARDIOVERSION N/A 01/18/2021   Procedure: TRANSESOPHAGEAL ECHOCARDIOGRAM (TEE);  Surgeon: [name], MD;  Location: [place];  Service: Open Heart Surgery;  Laterality: N/A;   TONSILLECTOMY     TUBAL LIGATION       Social History   Tobacco Use   Smoking status: Former    Packs/day: 1.00    Years: 40.00    Pack years: 40.00    Types: Cigarettes    Start date: 11/09/1970    Passive exposure: Current   Smokeless tobacco: Never   Tobacco comments:       Vaping Use   Vaping Use: Former  Substance Use Topics   Alcohol use: Yes    Alcohol/week: 0.0 standard drinks    Comment: rarely   Drug use: No       Family History  Problem Relation Age of Onset   Hypertension Mother    Hyperlipidemia Mother    Deep vein thrombosis Mother    Cancer Father    Alcohol abuse Father    Heart failure Father    Hypertension Maternal Grandmother    Deep vein thrombosis Sister    Alcohol abuse Sister    Alcohol abuse Sister      Allergies  Allergen Reactions   Simvastatin Diarrhea and Nausea And Vomiting   Simvastatin     Diarrhea, nausea,vomiting    Morphine And Related Itching   Lactose Intolerance (Gi) Diarrhea   Milk-Related Compounds Diarrhea    bloating   Morphine And Related Itching   Latex Rash    Rash when she wears gloves (Negative by test - per pt)   Latex      REVIEW OF SYSTEMS (Negative unless checked)  Constitutional: [] Weight loss  [] Fever  [] Chills Cardiac: [] Chest pain   [] Chest pressure   [] Palpitations   [] Shortness of breath when laying flat   [] Shortness of breath at rest   [x] Shortness of breath with exertion. Vascular:  [x] Pain in legs with walking   [] Pain in legs at rest   [] Pain in legs when laying flat   [] Claudication   [] Pain in feet when walking  [x] Pain in feet at rest  [] Pain in feet when laying flat   [] History of DVT   [] Phlebitis   [] Swelling in legs   [] Varicose veins   [] Non-healing ulcers Pulmonary:   [] Uses home oxygen   [] Productive cough   [] Hemoptysis   [] Wheeze  [] COPD   [] Asthma Neurologic:  [] Dizziness  [] Blackouts   [] Seizures   [] History of stroke   [] History of TIA  [] Aphasia   [] Temporary blindness   [] Dysphagia   [] Weakness or numbness in arms   [] Weakness or numbness in legs Musculoskeletal:  [] Arthritis   [] Joint swelling   [] Joint pain   [] Low back pain Hematologic:  [] Easy bruising  [] Easy bleeding   [] Hypercoagulable state   [] Anemic   Gastrointestinal:  [] Blood in stool   [] Vomiting blood  [] Gastroesophageal reflux/heartburn   [] Abdominal pain Genitourinary:  [] Chronic kidney disease   [] Difficult urination  [] Frequent urination  [] Burning with urination   [] Hematuria Skin:  [] Rashes   [] Ulcers   [] Wounds Psychological:  [] History of anxiety   []  History of major depression.  Physical Examination  BP (!) 173/79 (BP Location: Left Arm)    Pulse 91    Resp 18    Ht 5\' 7"  (  1.702 m)    Wt 182 lb (82.6 kg)    BMI 28.51 kg/m  Gen:  WD/WN, NAD Head: [place]/AT, No temporalis wasting. Ear/Nose/Throat: Hearing grossly intact, nares w/o erythema or drainage Eyes: Conjunctiva clear.  Sclera non-icteric Neck: Supple.  Trachea midline Pulmonary:  Good air movement, no use of accessory muscles.  Cardiac: RRR, no JVD Vascular:  Vessel Right Left  Radial Palpable Palpable                          PT 2+ palpable 1+ palpable  DP 1+ Palpable Not palpable   Gastrointestinal: soft, non-tender/non-distended. No guarding/reflex.  Musculoskeletal: M/S 5/5 throughout.  No deformity or atrophy.  Trace bilateral lower extremity edema. Neurologic: Sensation grossly intact in extremities.  Symmetrical.  Speech is fluent.  Psychiatric: Judgment intact, Mood & affect appropriate for pt's clinical situation. Dermatologic: No rashes or ulcers noted.  No cellulitis or open wounds.      Labs   Radiology   Assessment/Plan  Atherosclerosis of native arteries of extremity with rest pain ([place]) Her noninvasive studies today show occlusion of her left SFA stents with an ABI of 0.7 on the left but her digit pressure is only 19 on the left.  Her right ABI is 1.0 with pretty good waveforms. This is now a critical and limb threatening situation.  She has occluded her previous intervention now has rest pain.  She will continue her anticoagulants.  We will get her on the schedule this week for an angiogram with hopes of revascularization or at least delineating the anatomy for bypass surgery if that is what is required.  Risks and benefits were discussed with the patient she is agreeable to proceed.  Essential hypertension blood pressure control important in reducing the progression of atherosclerotic disease. On appropriate oral medications.   Type 2 diabetes mellitus with hyperglycemia, with long-term current use of insulin (HCC) blood glucose control important in reducing the progression of atherosclerotic disease. Also, involved in wound healing. On appropriate medications.   Hypercholesteremia lipid control important in reducing the progression of atherosclerotic disease.  Continue statin therapy     Leotis Pain, MD  02/26/2021 3:35 PM    This note was created with Dragon medical transcription system.  Any errors from dictation are purely unintentional

## 2021-02-26 NOTE — Assessment & Plan Note (Signed)
blood pressure control important in reducing the progression of atherosclerotic disease. On appropriate oral medications.  

## 2021-02-26 NOTE — Assessment & Plan Note (Signed)
lipid control important in reducing the progression of atherosclerotic disease. Continue statin therapy  

## 2021-02-27 ENCOUNTER — Telehealth: Payer: Self-pay | Admitting: *Deleted

## 2021-02-27 ENCOUNTER — Encounter: Payer: Self-pay | Admitting: Nurse Practitioner

## 2021-02-27 ENCOUNTER — Telehealth: Payer: Self-pay

## 2021-02-27 ENCOUNTER — Telehealth (INDEPENDENT_AMBULATORY_CARE_PROVIDER_SITE_OTHER): Payer: Medicare Other | Admitting: Nurse Practitioner

## 2021-02-27 VITALS — Resp 16 | Ht 67.0 in | Wt 180.0 lb

## 2021-02-27 DIAGNOSIS — G8918 Other acute postprocedural pain: Secondary | ICD-10-CM | POA: Diagnosis not present

## 2021-02-27 DIAGNOSIS — I10 Essential (primary) hypertension: Secondary | ICD-10-CM

## 2021-02-27 DIAGNOSIS — M064 Inflammatory polyarthropathy: Secondary | ICD-10-CM | POA: Diagnosis not present

## 2021-02-27 MED ORDER — OXYCODONE HCL 5 MG PO TABS: 5.0000 mg | ORAL_TABLET | Freq: Four times a day (QID) | ORAL | 0 refills | Status: DC | PRN

## 2021-02-27 NOTE — Telephone Encounter (Signed)
Spoke with alyssa and make her mychart video appt due to pt had procedure

## 2021-02-27 NOTE — Telephone Encounter (Signed)
Patient's sister left a VM with concerns over patient having to have an angiogram so soon after her CABG. Per sister, patient has a "complete blockage" in one of her legs. This is pushing patient's follow up in clinic back two weeks. A return number was not left for return call. Called patient and left VM to address any of concerns.

## 2021-02-27 NOTE — Progress Notes (Signed)
[facility] [place], [place] [address]  Internal MEDICINE  Telephone Visit  Patient Name: Morgan Daniels  [id number]  [id number]  Date of Service: 02/27/2021  I connected with the patient at 1:30 PM by telephone and verified the patients identity using two identifiers.   I discussed the limitations, risks, security and privacy concerns of performing an evaluation and management service by telephone and the availability of in person appointments. I also discussed with the patient that there may be a patient responsible charge related to the service.  The patient expressed understanding and agrees to proceed.    Chief Complaint  Patient presents with   Acute Visit    Yesterday BP was 170/78    Medication Refill   Telephone Assessment    [phone]    Telephone Screen    Video     HPI Morgan Daniels presents for a telehealth virtual visit for high blood pressure and increased pain. She had major surgery after thanksgiving. She had a heart attack and then had CABG x4. She was provided a prescription for 5 days of oxycodone for postoperative pain. Her cardiologist will not prescribe anymore pain medications and told her to get additional pain medication from her PCP since that is who she gets her tramadol from.   Current Medication: Outpatient Encounter Medications as of 02/27/2021  Medication Sig   amiodarone (PACERONE) 200 MG tablet Take 1 tablet (200 mg total) by mouth daily.   aspirin 81 MG chewable tablet Chew 1 tablet (81 mg total) by mouth daily.   cephALEXin (KEFLEX) 500 MG capsule Take 1 capsule (500 mg total) by mouth every 12 (twelve) hours. (Patient not taking: Reported on 02/28/2021)   Cholecalciferol (VITAMIN D-3) 125 MCG (5000 UT) TABS Take 5,000 Units by mouth daily.   clopidogrel (PLAVIX) 75 MG tablet Take 1 tablet (75 mg total) by mouth daily.   Continuous Blood Gluc Receiver (FREESTYLE LIBRE 2 READER) DEVI As directed   Continuous Blood Gluc Sensor  (FREESTYLE LIBRE 2 SENSOR) MISC USE TO TEST BLOOD GLUCOSE 4 TIMES A DAY . DX E11.65   FLUoxetine (PROZAC) 40 MG capsule Take 2 capsules (80 mg total) by mouth daily.   gabapentin (NEURONTIN) 600 MG tablet Take 1 tablet (600 mg total) by mouth 3 (three) times daily.   insulin degludec (TRESIBA FLEXTOUCH) 100 UNIT/ML FlexTouch Pen Inject 40 Units into the skin daily. (Patient taking differently: Inject 100 Units into the skin daily.)   metoprolol tartrate (LOPRESSOR) 25 MG tablet Take 1 tablet (25 mg total) by mouth 2 (two) times daily. (Patient not taking: Reported on 02/28/2021)   Multiple Vitamin (MULTIVITAMIN WITH MINERALS) TABS tablet Take 1 tablet by mouth daily. (Patient not taking: Reported on 02/28/2021)   pantoprazole sodium (PROTONIX) 40 mg Take 40 mg by mouth daily.   saccharomyces boulardii (FLORASTOR) 250 MG capsule Take 1 capsule (250 mg total) by mouth 2 (two) times daily. (Patient not taking: Reported on 02/28/2021)   tiZANidine (ZANAFLEX) 4 MG tablet TAKE 1 TABLET(S) BY MOUTH TWICE A DAY AS NEEDED FOR MUSCLE PAIN AND SPASMS   traMADol (ULTRAM) 50 MG tablet TAKE 1 TABLET BY MOUTH 3 TIMES DAILY AS NEEDED FOR MODERATE PAIN OR SEVERE PAIN. (Patient not taking: Reported on 02/28/2021)   umeclidinium-vilanterol (ANORO ELLIPTA) 62.5-25 MCG/ACT AEPB Inhale 1 puff into the lungs daily.   [DISCONTINUED] amLODipine (NORVASC) 10 MG tablet Take 1 tablet (10 mg total) by mouth daily.   [DISCONTINUED] atorvastatin (LIPITOR) 80 MG tablet  Take 1 tablet (80 mg total) by mouth daily.   [DISCONTINUED] oxyCODONE (OXY IR/ROXICODONE) 5 MG immediate release tablet Take 1 tablet (5 mg total) by mouth every 4 (four) hours as needed for severe pain.   nicotine (NICODERM CQ - DOSED IN MG/24 HOURS) 14 mg/24hr patch 14 mg patch daily x2 weeks then 7 mg patch daily x3 weeks and stop (Patient not taking: Reported on 02/28/2021)   oxyCODONE (OXY IR/ROXICODONE) 5 MG immediate release tablet Take 1 tablet (5 mg total) by  mouth every 6 (six) hours as needed for severe pain.   No facility-administered encounter medications on file as of 02/27/2021.    Surgical History: Past Surgical History:  Procedure Laterality Date   ABDOMINAL HYSTERECTOMY  1990   BACK SURGERY  2000, 2004   CAROTID ENDARTERECTOMY Left 05/06/12   CARPAL TUNNEL RELEASE Left 07/18/2015   Procedure: CARPAL TUNNEL RELEASE;  Surgeon: Earnestine Leys, MD;  Location: [facility];  Service: Orthopedics;  Laterality: Left;   CEREBRAL ANGIOGRAM Bilateral 05/03/2012   Procedure: CEREBRAL ANGIOGRAM;  Surgeon: Angelia Mould, MD;  [facility] CATH LAB;  Service: Cardiovascular;  Laterality: Bilateral;   CHOLECYSTECTOMY  2001   COLONOSCOPY WITH PROPOFOL N/A 11/19/2018   Procedure: COLONOSCOPY WITH PROPOFOL;  Surgeon: Lucilla Lame, MD;  Location: [place];  Service: Endoscopy;  Laterality: N/A;  Diabetic - insulin   CORONARY ARTERY BYPASS GRAFT N/A 01/18/2021   Procedure: CORONARY ARTERY BYPASS GRAFTING (CABG) x 4  USING LEFT INTERNAL MAMMARY ARTERY AND LEFT ENDOSCOPIC GREATER SAPHENOUS VEIN CONDUITS;  Surgeon: [name], MD;  Location: [place];  Service: Open Heart Surgery;  Laterality: N/A;   CORONARY/GRAFT ACUTE MI REVASCULARIZATION N/A 01/13/2021   Procedure: Coronary/Graft Acute MI Revascularization;  Surgeon: Isaias Cowman, MD;  Location: [place] CV LAB;  Service: Cardiovascular;  Laterality: N/A;   ENDARTERECTOMY Left 05/06/2012   Procedure: ENDARTERECTOMY CAROTID;  Surgeon: Angelia Mould, MD;  Location: [place];  Service: Vascular;  Laterality: Left;   ENDARTERECTOMY Right 08/09/2013   Procedure: ENDARTERECTOMY CAROTID-RIGHT;  Surgeon: Angelia Mould, MD;  Location: [place];  Service: Vascular;  Laterality: Right;   ENDOVEIN HARVEST OF GREATER SAPHENOUS VEIN Left 01/18/2021   Procedure: ENDOVEIN HARVEST OF GREATER SAPHENOUS VEIN;  Surgeon: [name], MD;  Location: [place];  Service: Open Heart  Surgery;  Laterality: Left;   HERNIA REPAIR     IABP INSERTION Right 01/13/2021   Procedure: IABP Insertion;  Surgeon: Isaias Cowman, MD;  Location: [place] CV LAB;  Service: Cardiovascular;  Laterality: Right;   LEFT HEART CATH AND CORONARY ANGIOGRAPHY N/A 01/13/2021   Procedure: LEFT HEART CATH AND CORONARY ANGIOGRAPHY;  Surgeon: Isaias Cowman, MD;  Location: [place] CV LAB;  Service: Cardiovascular;  Laterality: N/A;   LOWER EXTREMITY ANGIOGRAPHY Left 01/03/2021   Procedure: LOWER EXTREMITY ANGIOGRAPHY;  Surgeon: Algernon Huxley, MD;  Location: [place] CV LAB;  Service: Cardiovascular;  Laterality: Left;   LOWER EXTREMITY ANGIOGRAPHY Left 02/28/2021   Procedure: LOWER EXTREMITY ANGIOGRAPHY;  Surgeon: Algernon Huxley, MD;  Location: [place] CV LAB;  Service: Cardiovascular;  Laterality: Left;   PATCH ANGIOPLASTY Left 05/06/2012   Procedure: WITH DACRON PATCH ANGIOPLASTY ;  Surgeon: Angelia Mould, MD;  Location: [place];  Service: Vascular;  Laterality: Left;   POLYPECTOMY  11/19/2018   Procedure: POLYPECTOMY;  Surgeon: Lucilla Lame, MD;  Location: [place];  Service: Endoscopy;;   SPINE SURGERY  2004   TEE WITHOUT  CARDIOVERSION N/A 01/18/2021   Procedure: TRANSESOPHAGEAL ECHOCARDIOGRAM (TEE);  Surgeon: [name], MD;  Location: [place];  Service: Open Heart Surgery;  Laterality: N/A;   TONSILLECTOMY     TUBAL LIGATION      Medical History: Past Medical History:  Diagnosis Date   Anxiety    Arthritis    Bilateral carotid artery stenosis 2014   Carotid artery occlusion    Chronic kidney disease 06/18/2015   UTI   Chronic kidney disease 2017   Current smoker    CVA (cerebral vascular accident) ([place]) 2013   Depression    Diabetes ([place])    Diverticulosis    Fatty liver    Fibromyalgia    GERD (gastroesophageal reflux disease)    H/O hiatal hernia    Heart attack ([place]) 01/12/2021   Hiatal hernia    Hypercholesteremia     Hypertension    IBS (irritable bowel syndrome)    PAD (peripheral artery disease) ([facility])    Peptic ulcer    Plantar fasciitis    Stroke ([place]) 01/31/2012   Right side-ministroke   T2DM (type 2 diabetes mellitus) ([place])     Family History: Family History  Problem Relation Age of Onset   Hypertension Mother    Hyperlipidemia Mother    Deep vein thrombosis Mother    Cancer Father    Alcohol abuse Father    Heart failure Father    Hypertension Maternal Grandmother    Deep vein thrombosis Sister    Alcohol abuse Sister    Alcohol abuse Sister     Social History   Socioeconomic History   Marital status: Married    Spouse name: Not on file   Number of children: Not on file   Years of education: Not on file   Highest education level: Not on file  Occupational History   Not on file  Tobacco Use   Smoking status: Former    Packs/day: 1.00    Years: 40.00    Pack years: 40.00    Types: Cigarettes    Start date: 11/09/1970    Passive exposure: Current   Smokeless tobacco: Never   Tobacco comments:       Vaping Use   Vaping Use: Former  Substance and Sexual Activity   Alcohol use: Yes    Alcohol/week: 0.0 standard drinks    Comment: rarely   Drug use: No   Sexual activity: Not Currently    Partners: Male  Other Topics Concern   Not on file  Social History Narrative   ** Merged History Encounter **       Social Determinants of Health   Financial Resource Strain: Not on file  Food Insecurity: Not on file  Transportation Needs: Not on file  Physical Activity: Not on file  Stress: Not on file  Social Connections: Not on file  Intimate Partner Violence: Not on file      Review of Systems  Constitutional:  Negative for chills, fatigue and unexpected weight change.  HENT:  Negative for congestion, rhinorrhea, sneezing and sore throat.   Eyes:  Negative for redness.  Respiratory:  Negative for cough, chest tightness and shortness of breath.   Cardiovascular:   Negative for chest pain and palpitations.  Gastrointestinal:  Negative for abdominal pain, constipation, diarrhea, nausea and vomiting.  Genitourinary:  Negative for dysuria and frequency.  Musculoskeletal:  Negative for arthralgias, back pain, joint swelling and neck pain.  Skin:  Negative for rash.  Neurological: Negative.  Negative for tremors and numbness.  Hematological:  Negative for adenopathy. Does not bruise/bleed easily.  Psychiatric/Behavioral:  Negative for behavioral problems (Depression), sleep disturbance and suicidal ideas. The patient is not nervous/anxious.    Vital Signs: Resp 16    Ht 5\' 7"  (1.702 m)    Wt 180 lb (81.6 kg)    BMI 28.19 kg/m    Observation/Objective: She is alert and oriented and engages in conversation appropriately. She does not appear to be in any acute distress over video call.     Assessment/Plan: 1. Pain associated with surgical procedure Oxycodone prescription continued for now while patient is still recovering from major cardiac surgery. Her cardiothoracic surgeon would not prescribe her any more pain medication and instructed her to get further prescriptions from PCP in an effort to decrease the number of providers who are prescribing controlled substances to the patient.  - oxyCODONE (OXY IR/ROXICODONE) 5 MG immediate release tablet; Take 1 tablet (5 mg total) by mouth every 6 (six) hours as needed for severe pain.  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   3. Essential hypertension Blood pressure is high but she is in a lot of pain from major surgery and is still recovering.    General Counseling: Morgan Daniels understanding of the findings of today's phone visit and agrees with plan of treatment. I have discussed any further diagnostic evaluation that may be needed or ordered today. We also reviewed her medications today. she has been encouraged to call the office  with any questions or concerns that should arise related to todays visit.  Return in about 2 months (around 04/27/2021) for F/U, Joan Herschberger PCP.   No orders of the defined types were placed in this encounter.   Meds ordered this encounter  Medications   oxyCODONE (OXY IR/ROXICODONE) 5 MG immediate release tablet    Sig: Take 1 tablet (5 mg total) by mouth every 6 (six) hours as needed for severe pain.    Dispense:  60 tablet    Refill:  0    Time spent:10 Minutes Time spent with patient included reviewing progress notes, labs, imaging studies, and discussing plan for follow up.  [place] Controlled Substance Database was reviewed by me for overdose risk score (ORS) if appropriate.  This patient was seen by Jonetta Osgood, FNP-C in collaboration with Dr. Clayborn Bigness as a part of collaborative care agreement.  Decklan Mau R. Valetta Fuller, MSN, FNP-C Internal medicine

## 2021-02-28 ENCOUNTER — Other Ambulatory Visit (INDEPENDENT_AMBULATORY_CARE_PROVIDER_SITE_OTHER): Payer: Self-pay | Admitting: Nurse Practitioner

## 2021-02-28 ENCOUNTER — Other Ambulatory Visit: Payer: Self-pay

## 2021-02-28 ENCOUNTER — Encounter: Payer: Self-pay | Admitting: Vascular Surgery

## 2021-02-28 ENCOUNTER — Ambulatory Visit
Admission: RE | Admit: 2021-02-28 | Discharge: 2021-02-28 | Disposition: A | Discharge status: Home / Self Care | Payer: Medicare Other | Attending: Vascular Surgery | Admitting: Vascular Surgery

## 2021-02-28 ENCOUNTER — Ambulatory Visit: Payer: Medicare Other | Admitting: Thoracic Surgery (Cardiothoracic Vascular Surgery)

## 2021-02-28 ENCOUNTER — Encounter
Admission: RE | Disposition: A | Discharge status: Home / Self Care | Payer: Self-pay | Source: Home / Self Care | Attending: Vascular Surgery

## 2021-02-28 DIAGNOSIS — Z951 Presence of aortocoronary bypass graft: Secondary | ICD-10-CM | POA: Insufficient documentation

## 2021-02-28 DIAGNOSIS — Z9889 Other specified postprocedural states: Secondary | ICD-10-CM

## 2021-02-28 DIAGNOSIS — E1151 Type 2 diabetes mellitus with diabetic peripheral angiopathy without gangrene: Secondary | ICD-10-CM | POA: Insufficient documentation

## 2021-02-28 DIAGNOSIS — I70222 Atherosclerosis of native arteries of extremities with rest pain, left leg: Secondary | ICD-10-CM | POA: Insufficient documentation

## 2021-02-28 DIAGNOSIS — Z794 Long term (current) use of insulin: Secondary | ICD-10-CM | POA: Diagnosis not present

## 2021-02-28 DIAGNOSIS — I739 Peripheral vascular disease, unspecified: Secondary | ICD-10-CM

## 2021-02-28 DIAGNOSIS — N189 Chronic kidney disease, unspecified: Secondary | ICD-10-CM | POA: Insufficient documentation

## 2021-02-28 DIAGNOSIS — E1122 Type 2 diabetes mellitus with diabetic chronic kidney disease: Secondary | ICD-10-CM | POA: Insufficient documentation

## 2021-02-28 DIAGNOSIS — I129 Hypertensive chronic kidney disease with stage 1 through stage 4 chronic kidney disease, or unspecified chronic kidney disease: Secondary | ICD-10-CM | POA: Diagnosis not present

## 2021-02-28 DIAGNOSIS — Z79899 Other long term (current) drug therapy: Secondary | ICD-10-CM | POA: Diagnosis not present

## 2021-02-28 HISTORY — PX: LOWER EXTREMITY ANGIOGRAPHY: CATH118251

## 2021-02-28 LAB — BUN: BUN: 16 mg/dL (ref 6–20)

## 2021-02-28 LAB — GLUCOSE, CAPILLARY: Glucose-Capillary: 163 mg/dL — ABNORMAL HIGH (ref 70–99)

## 2021-02-28 LAB — CREATININE, SERUM
Creatinine, Ser: 0.7 mg/dL (ref 0.44–1.00)
GFR, Estimated: >60 mL/min (ref >60)

## 2021-02-28 SURGERY — LOWER EXTREMITY ANGIOGRAPHY
Anesthesia: Moderate Sedation | Site: Leg Lower | Laterality: Left

## 2021-02-28 MED ORDER — CEFAZOLIN SODIUM-DEXTROSE 1-4 GM/50ML-% IV SOLN
INTRAVENOUS | Status: DC | PRN
  Administered 2021-02-28: 2 g via INTRAVENOUS

## 2021-02-28 MED ORDER — CEFAZOLIN SODIUM-DEXTROSE 2-4 GM/100ML-% IV SOLN
INTRAVENOUS | Status: AC
  Filled 2021-02-28: qty 100

## 2021-02-28 MED ORDER — SODIUM CHLORIDE 0.9 % IV SOLN: INTRAVENOUS | Status: DC

## 2021-02-28 MED ORDER — FENTANYL CITRATE PF 50 MCG/ML IJ SOSY
PREFILLED_SYRINGE | INTRAMUSCULAR | Status: AC
  Filled 2021-02-28: qty 2

## 2021-02-28 MED ORDER — MIDAZOLAM HCL 2 MG/2ML IJ SOLN
INTRAMUSCULAR | Status: DC | PRN
  Administered 2021-02-28 (×3): 1 mg via INTRAVENOUS

## 2021-02-28 MED ORDER — SODIUM CHLORIDE 0.9 % IV SOLN: 250.0000 mL | INTRAVENOUS | Status: DC | PRN

## 2021-02-28 MED ORDER — SODIUM CHLORIDE 0.9% FLUSH: 3.0000 mL | INTRAVENOUS | Status: DC | PRN

## 2021-02-28 MED ORDER — LABETALOL HCL 5 MG/ML IV SOLN
INTRAVENOUS | Status: AC
  Filled 2021-02-28: qty 4

## 2021-02-28 MED ORDER — FENTANYL CITRATE (PF) 100 MCG/2ML IJ SOLN: 12.5000 ug | Freq: Once | INTRAMUSCULAR | Status: DC | PRN

## 2021-02-28 MED ORDER — FENTANYL CITRATE (PF) 100 MCG/2ML IJ SOLN
INTRAMUSCULAR | Status: DC | PRN
  Administered 2021-02-28 (×2): 50 ug via INTRAVENOUS

## 2021-02-28 MED ORDER — HYDRALAZINE HCL 20 MG/ML IJ SOLN: 5.0000 mg | INTRAMUSCULAR | Status: DC | PRN

## 2021-02-28 MED ORDER — FAMOTIDINE 20 MG PO TABS: 40.0000 mg | ORAL_TABLET | Freq: Once | ORAL | Status: DC | PRN

## 2021-02-28 MED ORDER — MIDAZOLAM HCL 5 MG/5ML IJ SOLN
INTRAMUSCULAR | Status: AC
  Filled 2021-02-28: qty 5

## 2021-02-28 MED ORDER — CEFAZOLIN SODIUM-DEXTROSE 2-4 GM/100ML-% IV SOLN: 2.0000 g | Freq: Once | INTRAVENOUS | Status: DC

## 2021-02-28 MED ORDER — HEPARIN SODIUM (PORCINE) 1000 UNIT/ML IJ SOLN
INTRAMUSCULAR | Status: DC | PRN
  Administered 2021-02-28: 5000 [IU] via INTRAVENOUS

## 2021-02-28 MED ORDER — ONDANSETRON HCL 4 MG/2ML IJ SOLN: 4.0000 mg | Freq: Four times a day (QID) | INTRAMUSCULAR | Status: DC | PRN

## 2021-02-28 MED ORDER — LABETALOL HCL 5 MG/ML IV SOLN
10.0000 mg | INTRAVENOUS | Status: DC | PRN
  Administered 2021-02-28: 10 mg via INTRAVENOUS

## 2021-02-28 MED ORDER — METHYLPREDNISOLONE SODIUM SUCC 125 MG IJ SOLR: 125.0000 mg | Freq: Once | INTRAMUSCULAR | Status: DC | PRN

## 2021-02-28 MED ORDER — ACETAMINOPHEN 325 MG PO TABS: 650.0000 mg | ORAL_TABLET | ORAL | Status: DC | PRN

## 2021-02-28 MED ORDER — SODIUM CHLORIDE 0.9% FLUSH: 3.0000 mL | Freq: Two times a day (BID) | INTRAVENOUS | Status: DC

## 2021-02-28 MED ORDER — HEPARIN SODIUM (PORCINE) 1000 UNIT/ML IJ SOLN
INTRAMUSCULAR | Status: AC
  Filled 2021-02-28: qty 10

## 2021-02-28 MED ORDER — DIPHENHYDRAMINE HCL 50 MG/ML IJ SOLN: 50.0000 mg | Freq: Once | INTRAMUSCULAR | Status: DC | PRN

## 2021-02-28 MED ORDER — MIDAZOLAM HCL 2 MG/ML PO SYRP: 8.0000 mg | ORAL_SOLUTION | Freq: Once | ORAL | Status: DC | PRN

## 2021-02-28 SURGICAL SUPPLY — 28 items
BALLN LUTONIX 018 5X300X130 (BALLOONS) ×2
BALLN LUTONIX 018 5X60X130 (BALLOONS) ×2
BALLN LUTONIX 018 6X40X130 (BALLOONS) ×2
BALLN ULTRVRSE 2.5X300X150 (BALLOONS) ×2
BALLN ULTRVRSE 3X150X150 (BALLOONS) ×2
BALLOON LUTONIX 018 5X300X130 (BALLOONS) IMPLANT
BALLOON LUTONIX 018 5X60X130 (BALLOONS) IMPLANT
BALLOON LUTONIX 018 6X40X130 (BALLOONS) IMPLANT
BALLOON ULTRVRSE 2.5X300X150 (BALLOONS) IMPLANT
BALLOON ULTRVRSE 3X150X150 (BALLOONS) IMPLANT
CATH ANGIO 5F PIGTAIL 65CM (CATHETERS) ×1 IMPLANT
CATH NAVICROSS ANGLED 135CM (MICROCATHETER) ×1 IMPLANT
CATH ROTAREX 135 6FR (CATHETERS) ×1 IMPLANT
CATH VERT 5X100 (CATHETERS) ×1 IMPLANT
COVER PROBE U/S 5X48 (MISCELLANEOUS) ×1 IMPLANT
DEVICE STARCLOSE SE CLOSURE (Vascular Products) ×1 IMPLANT
GLIDEWIRE ADV .035X260CM (WIRE) ×1 IMPLANT
GUIDEWIRE PFTE-COATED .018X300 (WIRE) ×1 IMPLANT
KIT ENCORE 26 ADVANTAGE (KITS) ×1 IMPLANT
PACK ANGIOGRAPHY (CUSTOM PROCEDURE TRAY) ×2 IMPLANT
SHEATH BRITE TIP 5FRX11 (SHEATH) ×1 IMPLANT
SHEATH DESTIN RDC 6FR 45 (SHEATH) ×1 IMPLANT
STENT VIABAHN 6X25X120 (Permanent Stent) ×1 IMPLANT
STENT VIABAHN 6X50X120 (Permanent Stent) ×1 IMPLANT
SYR MEDRAD MARK 7 150ML (SYRINGE) ×1 IMPLANT
TUBING CONTRAST HIGH PRESS 72 (TUBING) ×1 IMPLANT
WIRE G V18X300CM (WIRE) ×1 IMPLANT
WIRE GUIDERIGHT .035X150 (WIRE) ×1 IMPLANT

## 2021-02-28 NOTE — Interval H&P Note (Signed)
History and Physical Interval Note:  02/28/2021 10:26 AM  Morgan Daniels  has presented today for surgery, with the diagnosis of LLE Angio   BARD    PAD w rest pain.  The various methods of treatment have been discussed with the patient and family. After consideration of risks, benefits and other options for treatment, the patient has consented to  Procedure(s): LOWER EXTREMITY ANGIOGRAPHY (Left) as a surgical intervention.  The patient's history has been reviewed, patient examined, no change in status, stable for surgery.  I have reviewed the patient's chart and labs.  Questions were answered to the patient's satisfaction.     Leotis Pain

## 2021-02-28 NOTE — Op Note (Signed)
[facility]  Percutaneous Study/Intervention Procedural Note   Date of Surgery: 02/28/2021  Surgeon(s):Ivery Michalski    Assistants:none  Pre-operative Diagnosis: PAD with rest pain left lower extremity, subacute on chronic disease  Post-operative diagnosis:  Same  Procedure(s) Performed:             1.  Ultrasound guidance for vascular access right femoral artery             2.  Catheter placement into left common femoral artery from right femoral approach             3.  Aortogram and selective left lower extremity angiogram             4.  Percutaneous transluminal angioplasty of left peroneal artery with 2.5 mm diameter angioplasty balloon             5.  Mechanical thrombectomy of the left SFA and popliteal arteries with the [place] Rex device  6.  Percutaneous transluminal angioplasty of left SFA and popliteal arteries with 5 mm diameter by 30 cm length Lutonix drug-coated angioplasty balloon  7.  Viabahn stent placement x2 to the left SFA and most proximal popliteal artery with a 6 mm diameter by 5 cm length proximally and a 6 mm diameter by 2.5 cm length distally  8.  Percutaneous transluminal angioplasty of the left anterior tibial artery with 3 mm diameter by 15 cm length angioplasty balloon             9.  StarClose closure device right femoral artery  EBL: 50 cc  Contrast: 70 cc  Fluoro Time: 10.4 minutes  Moderate Conscious Sedation Time: approximately 51 minutes using 3 mg of Versed and 100 mcg of Fentanyl              Indications:  Patient is a 61 y.o.female with recurrent rest pain of the left lower extremity over the past several weeks. The patient has noninvasive study showing occlusion of her previous interventions with reduced ABI after her cardiac bypass last month. The patient is brought in for angiography for further evaluation and potential treatment.  Due to the limb threatening nature of the situation, angiogram was performed for attempted  limb salvage. The patient is aware that if the procedure fails, amputation would be expected.  The patient also understands that even with successful revascularization, amputation may still be required due to the severity of the situation. Risks and benefits are discussed and informed consent is obtained.   Procedure:  The patient was identified and appropriate procedural time out was performed.  The patient was then placed supine on the table and prepped and draped in the usual sterile fashion. Moderate conscious sedation was administered during a face to face encounter with the patient throughout the procedure with my supervision of the RN administering medicines and monitoring the patient's vital signs, pulse oximetry, telemetry and mental status throughout from the start of the procedure until the patient was taken to the recovery room. Ultrasound was used to evaluate the right common femoral artery.  It was patent .  A digital ultrasound image was acquired.  A Seldinger needle was used to access the right common femoral artery under direct ultrasound guidance and a permanent image was performed.  A 0.035 J wire was advanced without resistance and a 5Fr sheath was placed.  Pigtail catheter was placed into the aorta and an AP aortogram was performed. This demonstrated normal renal arteries and normal aorta and  iliac segments without significant stenosis with ectasia or mild aneurysmal degeneration of the aorta. I then crossed the aortic bifurcation and advanced to the left femoral head. Selective left lower extremity angiogram was then performed. This demonstrated relatively normal common femoral artery and profunda femoris artery with occlusion of the SFA just above the previously placed stents.  There is then reconstitution of the above-knee popliteal artery.  There is then two-vessel runoff distally with the peroneal artery and anterior tibial artery both had either stenosis which was high-grade greater than  80% or occlusion in the mid segments in the mid to distal segment and the peroneal artery that was difficult to opacify initially due to sluggish flow.  The posterior tibial artery was chronically occluded. It was felt that it was in the patient's best interest to proceed with intervention after these images to avoid a second procedure and a larger amount of contrast and fluoroscopy based off of the findings from the initial angiogram. The patient was systemically heparinized and a 6 [place] [employer] sheath was then placed over the [employer] wire. I then used a Kumpe catheter and the advantage wire to easily navigate through the SFA and popliteal occlusions and initially got down into the tibioperoneal trunk.  I then exchanged for a 0.018 wire and Nava cross catheter and then crossed the occlusion in the mid peroneal artery parking the wire at the level of the ankle.  A 2.5 mm diameter by 30 cm length angioplasty balloon was then used to treat the peroneal artery up to the tibioperoneal trunk and inflated this up to 8 atm for 1 minute.  I then performed mechanical thrombectomy to debulk the thrombus within and just above and below the previously placed stents in the SFA and popliteal arteries.  2 passes with the [place] Rex device were performed.  This resulted in a nice channel throughout the stents although there remained significant narrowing particularly at the proximal and distal aspects of the previously placed stents.  The peroneal artery now had nice flow down to the level of the ankle where it was treated, but there was still very poor flow into the foot.  I performed balloon angioplasty of the SFA and popliteal arteries with a 5 mm diameter by 30 cm length Lutonix drug-coated angioplasty balloon inflated to 12 atm for 1 minute.  Completion imaging showed only hyperplastic stenosis with small amount of thrombus at the proximal and distal aspects of the previously placed stents.  The remainder of the  stents and less than 10% residual stenosis.  At the proximal aspect, 6 mm diameter by 5 cm length Viabahn stent was deployed and postdilated with a 5 mm balloon with excellent angiographic result and less than 10% residual stenosis.  At the distal aspect, a 6 mm diameter by 3.5 cm length Viabahn stent was deployed and postdilated with a 6 mm balloon with excellent angiographic ablation result and less than 10% residual stenosis.  I then used the Kumpe catheter and the V 18 wire to get into the anterior tibial artery which had an anomalous high and medial takeoff from the popliteal artery.  Once I got into the anterior tibial artery was able to easily cross the high-grade stenosis or occlusion in the midsegment and parked the wire in the foot.  A 3 mm diameter by 15 cm length angioplasty balloon was then used to treat the mid segment of the anterior tibial artery inflating this to 6 atm for 1 minute.  Completion imaging  showed excellent flow with less than 20% residual stenosis in the anterior tibial artery. I elected to terminate the procedure. The sheath was removed and StarClose closure device was deployed in the right femoral artery with excellent hemostatic result. The patient was taken to the recovery room in stable condition having tolerated the procedure well.  Findings:               Aortogram:  This demonstrated normal renal arteries and normal aorta and iliac segments without significant stenosis with ectasia or mild aneurysmal degeneration of the aorta             Left lower Extremity: Relatively normal common femoral artery and profunda femoris artery with occlusion of the SFA just above the previously placed stents.  There is then reconstitution of the above-knee popliteal artery.  There is then two-vessel runoff distally with the peroneal artery and anterior tibial artery both had either stenosis which was high-grade greater than 80% or occlusion in the mid segments in the mid to distal segment and  the peroneal artery that was difficult to opacify initially due to sluggish flow.  The posterior tibial artery was chronically occluded   Disposition: Patient was taken to the recovery room in stable condition having tolerated the procedure well.  Complications: None  Leotis Pain 02/28/2021 12:03 PM   This note was created with Dragon Medical transcription system. Any errors in dictation are purely unintentional.

## 2021-03-01 ENCOUNTER — Encounter: Payer: Self-pay | Admitting: Vascular Surgery

## 2021-03-01 ENCOUNTER — Other Ambulatory Visit: Payer: Self-pay | Admitting: Nurse Practitioner

## 2021-03-04 ENCOUNTER — Encounter: Payer: Self-pay | Admitting: Nurse Practitioner

## 2021-03-08 DIAGNOSIS — D62 Acute posthemorrhagic anemia: Secondary | ICD-10-CM | POA: Diagnosis not present

## 2021-03-08 DIAGNOSIS — Z951 Presence of aortocoronary bypass graft: Secondary | ICD-10-CM | POA: Diagnosis not present

## 2021-03-08 DIAGNOSIS — Z48812 Encounter for surgical aftercare following surgery on the circulatory system: Secondary | ICD-10-CM | POA: Diagnosis not present

## 2021-03-08 DIAGNOSIS — I2510 Atherosclerotic heart disease of native coronary artery without angina pectoris: Secondary | ICD-10-CM | POA: Diagnosis not present

## 2021-03-08 DIAGNOSIS — G9340 Encephalopathy, unspecified: Secondary | ICD-10-CM | POA: Diagnosis not present

## 2021-03-08 DIAGNOSIS — I252 Old myocardial infarction: Secondary | ICD-10-CM | POA: Diagnosis not present

## 2021-03-11 ENCOUNTER — Other Ambulatory Visit: Payer: Self-pay | Admitting: Thoracic Surgery (Cardiothoracic Vascular Surgery)

## 2021-03-11 DIAGNOSIS — Z951 Presence of aortocoronary bypass graft: Secondary | ICD-10-CM

## 2021-03-12 ENCOUNTER — Ambulatory Visit (INDEPENDENT_AMBULATORY_CARE_PROVIDER_SITE_OTHER): Payer: Self-pay | Admitting: Thoracic Surgery (Cardiothoracic Vascular Surgery)

## 2021-03-12 ENCOUNTER — Ambulatory Visit
Admission: RE | Admit: 2021-03-12 | Discharge: 2021-03-12 | Disposition: A | Discharge status: Home / Self Care | Payer: Medicare Other | Source: Ambulatory Visit | Attending: Thoracic Surgery (Cardiothoracic Vascular Surgery) | Admitting: Thoracic Surgery (Cardiothoracic Vascular Surgery)

## 2021-03-12 ENCOUNTER — Telehealth: Payer: Self-pay

## 2021-03-12 ENCOUNTER — Other Ambulatory Visit: Payer: Self-pay

## 2021-03-12 ENCOUNTER — Encounter: Payer: Self-pay | Admitting: Thoracic Surgery (Cardiothoracic Vascular Surgery)

## 2021-03-12 VITALS — BP 148/81 | HR 79 | Resp 20 | Ht 67.0 in | Wt 192.6 lb

## 2021-03-12 DIAGNOSIS — Z951 Presence of aortocoronary bypass graft: Secondary | ICD-10-CM

## 2021-03-12 NOTE — Progress Notes (Signed)
[address]       [place] [address]             [phone]     HPI: Morgan Daniels returns for follow-up after coronary bypass grafting  Morgan Daniels is a 61 year old woman with a history of tobacco abuse, COPD, PAD, type 2 diabetes, hypertension, hyperlipidemia, and severe irritable bowel syndrome.  She presented with acute respiratory failure and unresponsiveness.  She was intubated.  She ruled in for non-ST elevation MI.  She had cardiac catheterization where she was found to have left main and three-vessel disease.  Dr. Saralyn Pilar placed an intra-aortic balloon pump and she was transferred to [facility].  She underwent coronary bypass grafting x4 on 01/18/2021.  She had small diffusely diseased vessels.  She did have atrial fibrillation and was treated with amiodarone.  Not surprisingly, she had a slow recovery postoperatively.  She was discharged to CIR.  She was having issues with her left foot.  Dr. Lucky Cowboy found that her left SFA stents were occluded.  She had a percutaneous intervention on 02/28/2021.  She has not having much pain.  She does feel very tired and fatigues quickly with activity.  She has not seen a cardiologist since discharge.   Past Medical History:  Diagnosis Date   Anxiety    Arthritis    Bilateral carotid artery stenosis 2014   Carotid artery occlusion    Chronic kidney disease 06/18/2015   UTI   Chronic kidney disease 2017   Current smoker    CVA (cerebral vascular accident) ([place]) 2013   Depression    Diabetes ([place])    Diverticulosis    Fatty liver    Fibromyalgia    GERD (gastroesophageal reflux disease)    H/O hiatal hernia    Heart attack ([place]) 01/12/2021   Hiatal hernia    Hypercholesteremia    Hypertension    IBS (irritable bowel syndrome)    PAD (peripheral artery disease) ([facility])    Peptic ulcer    Plantar fasciitis    Stroke ([place]) 01/31/2012   Right side-ministroke   T2DM (type 2 diabetes mellitus) ([facility])     Current  Outpatient Medications  Medication Sig Dispense Refill   amiodarone (PACERONE) 200 MG tablet Take 1 tablet (200 mg total) by mouth daily. 30 tablet 0   amLODipine (NORVASC) 10 MG tablet TAKE 1 TABLET BY MOUTH EVERY DAY 30 tablet 3   aspirin 81 MG chewable tablet Chew 1 tablet (81 mg total) by mouth daily.     atorvastatin (LIPITOR) 80 MG tablet TAKE 1 TABLET BY MOUTH EVERY DAY 30 tablet 3   cephALEXin (KEFLEX) 500 MG capsule Take 1 capsule (500 mg total) by mouth every 12 (twelve) hours. 8 capsule 0   Cholecalciferol (VITAMIN D-3) 125 MCG (5000 UT) TABS Take 5,000 Units by mouth daily. 30 tablet 0   clopidogrel (PLAVIX) 75 MG tablet Take 1 tablet (75 mg total) by mouth daily. 30 tablet 11   Continuous Blood Gluc Receiver (FREESTYLE LIBRE 2 READER) DEVI As directed 1 each 0   Continuous Blood Gluc Sensor (FREESTYLE LIBRE 2 SENSOR) MISC USE TO TEST BLOOD GLUCOSE 4 TIMES A DAY . DX E11.65 2 each 3   FLUoxetine (PROZAC) 40 MG capsule Take 2 capsules (80 mg total) by mouth daily. 30 capsule 3   gabapentin (NEURONTIN) 600 MG tablet Take 1 tablet (600 mg total) by mouth 3 (three) times daily. 90 tablet 0  insulin degludec (TRESIBA FLEXTOUCH) 100 UNIT/ML FlexTouch Pen Inject 40 Units into the skin daily. (Patient taking differently: Inject 100 Units into the skin daily.) 3 mL 0   metoprolol tartrate (LOPRESSOR) 25 MG tablet Take 1 tablet (25 mg total) by mouth 2 (two) times daily. 60 tablet 0   Multiple Vitamin (MULTIVITAMIN WITH MINERALS) TABS tablet Take 1 tablet by mouth daily.     oxyCODONE (OXY IR/ROXICODONE) 5 MG immediate release tablet Take 1 tablet (5 mg total) by mouth every 6 (six) hours as needed for severe pain. 60 tablet 0   pantoprazole sodium (PROTONIX) 40 mg Take 40 mg by mouth daily. 30 packet 0   saccharomyces boulardii (FLORASTOR) 250 MG capsule Take 1 capsule (250 mg total) by mouth 2 (two) times daily. 60 capsule 0   tiZANidine (ZANAFLEX) 4 MG tablet TAKE 1 TABLET(S) BY MOUTH TWICE  A DAY AS NEEDED FOR MUSCLE PAIN AND SPASMS 60 tablet 1   umeclidinium-vilanterol (ANORO ELLIPTA) 62.5-25 MCG/ACT AEPB Inhale 1 puff into the lungs daily. 3 each 1   No current facility-administered medications for this visit.    Physical Exam BP (!) 148/81 (BP Location: Right Arm, Patient Position: Sitting, Cuff Size: Normal)    Pulse 79    Resp 20    Ht 5\' 7"  (1.702 m)    Wt 192 lb 9.6 oz (87.4 kg)    SpO2 95% Comment: RA   BMI 30.87 kg/m  61 year old woman in no acute distress Alert and oriented x3 with no focal deficits Lungs clear bilaterally Cardiac regular rate and rhythm Sternum stable, incision well-healed  Diagnostic Tests: CHEST - 2 VIEW   COMPARISON:  Chest x-ray dated January 24, 2021   FINDINGS: Cardiac contours are upper limits of normal in size. Normal mediastinal contours. Prior median sternotomy and CABG. Lungs are clear. No pleural effusion or pneumothorax.   IMPRESSION: No active cardiopulmonary disease.     Electronically Signed   By: Morgan Daniels M.D.   On: 03/12/2021 09:52  Impression: Morgan Daniels is a 61 year old woman with multiple cardiac risk factors and known PAD who presented with respiratory failure and unresponsiveness.  She was intubated.  She did rule in for non-ST elevation MI.  She ultimately had cardiac catheterization which showed left main and three-vessel disease.  She underwent coronary bypass grafting x4 on 01/18/2021.  Her postoperative course was relatively unremarkable.  She did some atrial fibrillation treated with amiodarone.  Her progress was slow due to her week of being on the ventilator prior to surgery.  She ultimately went to inpatient rehab where she only stayed about a week before going home.  Her incisions are healing well.  She is not having much incisional pain.  She is no longer under a 10 pound weight restriction, but was cautioned to build into new activities gradually.  She may begin driving on a limited basis.   Appropriate precautions were discussed.  She does get fatigued very easily.  That is not surprising given complicated course leading up to surgery and the operation itself.  She is a little over 6 weeks out from surgery now and should start to see some slow but steady improvement with time.  Plan: Needs follow-up with cardiology.  Dr. Saralyn Pilar did her cath.  We will arrange for an appointment with him. I will be happy to see her back anytime in the future if I can be of any further assistance with her care  [name], MD [facility] [phone]

## 2021-03-12 NOTE — Telephone Encounter (Signed)
Morgan Daniels from [facility] called to get verbal orders for pt 1w5 for physical therapy .

## 2021-03-14 DIAGNOSIS — F321 Major depressive disorder, single episode, moderate: Secondary | ICD-10-CM | POA: Diagnosis not present

## 2021-03-14 DIAGNOSIS — R1312 Dysphagia, oropharyngeal phase: Secondary | ICD-10-CM | POA: Diagnosis not present

## 2021-03-14 DIAGNOSIS — J440 Chronic obstructive pulmonary disease with acute lower respiratory infection: Secondary | ICD-10-CM | POA: Diagnosis not present

## 2021-03-14 DIAGNOSIS — I2510 Atherosclerotic heart disease of native coronary artery without angina pectoris: Secondary | ICD-10-CM | POA: Diagnosis not present

## 2021-03-14 DIAGNOSIS — J9602 Acute respiratory failure with hypercapnia: Secondary | ICD-10-CM | POA: Diagnosis not present

## 2021-03-14 DIAGNOSIS — Z6830 Body mass index (BMI) 30.0-30.9, adult: Secondary | ICD-10-CM | POA: Diagnosis not present

## 2021-03-14 DIAGNOSIS — E669 Obesity, unspecified: Secondary | ICD-10-CM | POA: Diagnosis not present

## 2021-03-14 DIAGNOSIS — Z48812 Encounter for surgical aftercare following surgery on the circulatory system: Secondary | ICD-10-CM | POA: Diagnosis not present

## 2021-03-14 DIAGNOSIS — D62 Acute posthemorrhagic anemia: Secondary | ICD-10-CM | POA: Diagnosis not present

## 2021-03-14 DIAGNOSIS — N189 Chronic kidney disease, unspecified: Secondary | ICD-10-CM | POA: Diagnosis not present

## 2021-03-14 DIAGNOSIS — Z8673 Personal history of transient ischemic attack (TIA), and cerebral infarction without residual deficits: Secondary | ICD-10-CM | POA: Diagnosis not present

## 2021-03-14 DIAGNOSIS — J189 Pneumonia, unspecified organism: Secondary | ICD-10-CM | POA: Diagnosis not present

## 2021-03-14 DIAGNOSIS — E1122 Type 2 diabetes mellitus with diabetic chronic kidney disease: Secondary | ICD-10-CM | POA: Diagnosis not present

## 2021-03-14 DIAGNOSIS — E039 Hypothyroidism, unspecified: Secondary | ICD-10-CM | POA: Diagnosis not present

## 2021-03-14 DIAGNOSIS — Z9181 History of falling: Secondary | ICD-10-CM | POA: Diagnosis not present

## 2021-03-14 DIAGNOSIS — J9601 Acute respiratory failure with hypoxia: Secondary | ICD-10-CM | POA: Diagnosis not present

## 2021-03-14 DIAGNOSIS — E1151 Type 2 diabetes mellitus with diabetic peripheral angiopathy without gangrene: Secondary | ICD-10-CM | POA: Diagnosis not present

## 2021-03-14 DIAGNOSIS — E7800 Pure hypercholesterolemia, unspecified: Secondary | ICD-10-CM | POA: Diagnosis not present

## 2021-03-14 DIAGNOSIS — Z87440 Personal history of urinary (tract) infections: Secondary | ICD-10-CM | POA: Diagnosis not present

## 2021-03-14 DIAGNOSIS — Z951 Presence of aortocoronary bypass graft: Secondary | ICD-10-CM | POA: Diagnosis not present

## 2021-03-14 DIAGNOSIS — F17210 Nicotine dependence, cigarettes, uncomplicated: Secondary | ICD-10-CM | POA: Diagnosis not present

## 2021-03-14 DIAGNOSIS — F410 Panic disorder [episodic paroxysmal anxiety] without agoraphobia: Secondary | ICD-10-CM | POA: Diagnosis not present

## 2021-03-14 DIAGNOSIS — G9340 Encephalopathy, unspecified: Secondary | ICD-10-CM | POA: Diagnosis not present

## 2021-03-14 DIAGNOSIS — I1310 Hypertensive heart and chronic kidney disease without heart failure, with stage 1 through stage 4 chronic kidney disease, or unspecified chronic kidney disease: Secondary | ICD-10-CM | POA: Diagnosis not present

## 2021-03-14 DIAGNOSIS — I252 Old myocardial infarction: Secondary | ICD-10-CM | POA: Diagnosis not present

## 2021-03-14 NOTE — Telephone Encounter (Signed)
Order approved and given.

## 2021-03-17 ENCOUNTER — Other Ambulatory Visit: Payer: Self-pay | Admitting: Nurse Practitioner

## 2021-03-17 DIAGNOSIS — K580 Irritable bowel syndrome with diarrhea: Secondary | ICD-10-CM

## 2021-03-17 DIAGNOSIS — M064 Inflammatory polyarthropathy: Secondary | ICD-10-CM

## 2021-03-17 DIAGNOSIS — M7540 Impingement syndrome of unspecified shoulder: Secondary | ICD-10-CM

## 2021-03-18 NOTE — Telephone Encounter (Signed)
meds sent to pharmacy

## 2021-03-20 DIAGNOSIS — I25721 Atherosclerosis of autologous artery coronary artery bypass graft(s) with angina pectoris with documented spasm: Secondary | ICD-10-CM | POA: Diagnosis not present

## 2021-03-20 DIAGNOSIS — I739 Peripheral vascular disease, unspecified: Secondary | ICD-10-CM | POA: Diagnosis not present

## 2021-03-20 DIAGNOSIS — I10 Essential (primary) hypertension: Secondary | ICD-10-CM | POA: Diagnosis not present

## 2021-03-20 DIAGNOSIS — E7800 Pure hypercholesterolemia, unspecified: Secondary | ICD-10-CM | POA: Diagnosis not present

## 2021-03-20 DIAGNOSIS — Z720 Tobacco use: Secondary | ICD-10-CM | POA: Diagnosis not present

## 2021-03-27 ENCOUNTER — Other Ambulatory Visit (INDEPENDENT_AMBULATORY_CARE_PROVIDER_SITE_OTHER): Payer: Self-pay | Admitting: Vascular Surgery

## 2021-03-27 DIAGNOSIS — I70222 Atherosclerosis of native arteries of extremities with rest pain, left leg: Secondary | ICD-10-CM

## 2021-03-27 DIAGNOSIS — Z959 Presence of cardiac and vascular implant and graft, unspecified: Secondary | ICD-10-CM

## 2021-03-28 ENCOUNTER — Ambulatory Visit (INDEPENDENT_AMBULATORY_CARE_PROVIDER_SITE_OTHER): Payer: Medicare Other

## 2021-03-28 ENCOUNTER — Encounter (INDEPENDENT_AMBULATORY_CARE_PROVIDER_SITE_OTHER): Payer: Self-pay | Admitting: Nurse Practitioner

## 2021-03-28 ENCOUNTER — Ambulatory Visit (INDEPENDENT_AMBULATORY_CARE_PROVIDER_SITE_OTHER): Payer: Medicare Other | Admitting: Nurse Practitioner

## 2021-03-28 ENCOUNTER — Other Ambulatory Visit: Payer: Self-pay

## 2021-03-28 VITALS — BP 176/78 | HR 86 | Resp 16 | Wt 186.0 lb

## 2021-03-28 DIAGNOSIS — Z951 Presence of aortocoronary bypass graft: Secondary | ICD-10-CM | POA: Diagnosis not present

## 2021-03-28 DIAGNOSIS — Z959 Presence of cardiac and vascular implant and graft, unspecified: Secondary | ICD-10-CM | POA: Diagnosis not present

## 2021-03-28 DIAGNOSIS — E1165 Type 2 diabetes mellitus with hyperglycemia: Secondary | ICD-10-CM | POA: Diagnosis not present

## 2021-03-28 DIAGNOSIS — I739 Peripheral vascular disease, unspecified: Secondary | ICD-10-CM | POA: Diagnosis not present

## 2021-03-28 DIAGNOSIS — Z794 Long term (current) use of insulin: Secondary | ICD-10-CM | POA: Diagnosis not present

## 2021-03-28 DIAGNOSIS — E7800 Pure hypercholesterolemia, unspecified: Secondary | ICD-10-CM

## 2021-03-28 DIAGNOSIS — I70222 Atherosclerosis of native arteries of extremities with rest pain, left leg: Secondary | ICD-10-CM

## 2021-03-28 DIAGNOSIS — Z9889 Other specified postprocedural states: Secondary | ICD-10-CM | POA: Diagnosis not present

## 2021-03-29 ENCOUNTER — Other Ambulatory Visit: Payer: Self-pay | Admitting: "Endocrinology

## 2021-03-29 DIAGNOSIS — E1142 Type 2 diabetes mellitus with diabetic polyneuropathy: Secondary | ICD-10-CM

## 2021-03-29 DIAGNOSIS — Z20822 Contact with and (suspected) exposure to covid-19: Secondary | ICD-10-CM | POA: Diagnosis not present

## 2021-04-02 ENCOUNTER — Encounter: Payer: Self-pay | Admitting: Podiatry

## 2021-04-02 ENCOUNTER — Other Ambulatory Visit: Payer: Self-pay

## 2021-04-02 ENCOUNTER — Ambulatory Visit (INDEPENDENT_AMBULATORY_CARE_PROVIDER_SITE_OTHER): Payer: Medicare Other | Admitting: Podiatry

## 2021-04-02 DIAGNOSIS — L84 Corns and callosities: Secondary | ICD-10-CM | POA: Diagnosis not present

## 2021-04-05 DIAGNOSIS — Z48812 Encounter for surgical aftercare following surgery on the circulatory system: Secondary | ICD-10-CM | POA: Diagnosis not present

## 2021-04-05 DIAGNOSIS — I2510 Atherosclerotic heart disease of native coronary artery without angina pectoris: Secondary | ICD-10-CM | POA: Diagnosis not present

## 2021-04-05 DIAGNOSIS — I252 Old myocardial infarction: Secondary | ICD-10-CM | POA: Diagnosis not present

## 2021-04-05 DIAGNOSIS — G9340 Encephalopathy, unspecified: Secondary | ICD-10-CM | POA: Diagnosis not present

## 2021-04-05 DIAGNOSIS — D62 Acute posthemorrhagic anemia: Secondary | ICD-10-CM | POA: Diagnosis not present

## 2021-04-05 DIAGNOSIS — Z951 Presence of aortocoronary bypass graft: Secondary | ICD-10-CM | POA: Diagnosis not present

## 2021-04-07 ENCOUNTER — Encounter (INDEPENDENT_AMBULATORY_CARE_PROVIDER_SITE_OTHER): Payer: Self-pay | Admitting: Nurse Practitioner

## 2021-04-07 NOTE — Progress Notes (Signed)
Subjective:    Patient ID: Morgan Daniels, female    DOB: 05-Nov-1960, 61 y.o.   MRN: [id number] Chief Complaint  Patient presents with   Follow-up    Ultrasound follow up    The patient returns to the office for followup and review status post angiogram with intervention. The patient notes improvement in the lower extremity symptoms. No interval shortening of the patient's claudication distance or rest pain symptoms.  No new ulcers or wounds have occurred since the last visit.  There have been no significant changes to the patient's overall health care.  The patient denies amaurosis fugax or recent TIA symptoms. There are no recent neurological changes noted. The patient denies history of DVT, PE or superficial thrombophlebitis. The patient denies recent episodes of angina or shortness of breath.   ABI's Rt=1.02 and Lt=1.04  (previous ABI's Rt=1.06 and Lt=0.66) Duplex US of the right lower extremity shows biphasic/triphasic waveforms with triphasic waveforms the left.  The patient has good toe waveforms bilaterally.   Review of Systems  Skin:  Negative for wound.  All other systems reviewed and are negative.     Objective:   Physical Exam Vitals reviewed.  HENT:     Head: Normocephalic.  Cardiovascular:     Rate and Rhythm: Normal rate.     Pulses: Normal pulses.  Pulmonary:     Effort: Pulmonary effort is normal.  Skin:    General: Skin is warm and dry.  Neurological:     Mental Status: She is alert and oriented to person, place, and time.  Psychiatric:        Mood and Affect: Mood normal.        Behavior: Behavior normal.        Thought Content: Thought content normal.        Judgment: Judgment normal.    BP (!) 176/78 (BP Location: Right Arm)    Pulse 86    Resp 16    Wt 186 lb (84.4 kg)    BMI 29.13 kg/m   Past Medical History:  Diagnosis Date   Anxiety    Arthritis    Bilateral carotid artery stenosis 2014   Carotid artery occlusion    Chronic kidney  disease 06/18/2015   UTI   Chronic kidney disease 2017   Current smoker    CVA (cerebral vascular accident) ([place]) 2013   Depression    Diabetes ([place])    Diverticulosis    Fatty liver    Fibromyalgia    GERD (gastroesophageal reflux disease)    H/O hiatal hernia    Heart attack ([place]) 01/12/2021   Hiatal hernia    Hypercholesteremia    Hypertension    IBS (irritable bowel syndrome)    PAD (peripheral artery disease) (HCC)    Peptic ulcer    Plantar fasciitis    Stroke ([place]) 01/31/2012   Right side-ministroke   T2DM (type 2 diabetes mellitus) ([place])     Social History   Socioeconomic History   Marital status: Married    Spouse name: Not on file   Number of children: Not on file   Years of education: Not on file   Highest education level: Not on file  Occupational History   Not on file  Tobacco Use   Smoking status: Former    Packs/day: 1.00    Years: 40.00    Pack years: 40.00    Types: Cigarettes    Start date: 11/09/1970    Passive exposure: Current  Smokeless tobacco: Never   Tobacco comments:       Vaping Use   Vaping Use: Former  Substance and Sexual Activity   Alcohol use: Not Currently    Comment: rarely   Drug use: No   Sexual activity: Not Currently    Partners: Male  Other Topics Concern   Not on file  Social History Narrative   ** Merged History Encounter **       Social Determinants of Health   Financial Resource Strain: Not on file  Food Insecurity: Not on file  Transportation Needs: Not on file  Physical Activity: Not on file  Stress: Not on file  Social Connections: Not on file  Intimate Partner Violence: Not on file    Past Surgical History:  Procedure Laterality Date   [place]  2000, 2004   CAROTID ENDARTERECTOMY Left 05/06/12   CARPAL TUNNEL RELEASE Left 07/18/2015   Procedure: CARPAL TUNNEL RELEASE;  Surgeon: Earnestine Leys, MD;  Location: [facility];  Service: Orthopedics;  Laterality: Left;    CEREBRAL ANGIOGRAM Bilateral 05/03/2012   Procedure: CEREBRAL ANGIOGRAM;  Surgeon: Angelia Mould, MD;  Location: [facility] CATH [facility];  Service: Cardiovascular;  Laterality: Bilateral;   CHOLECYSTECTOMY  2001   COLONOSCOPY WITH PROPOFOL N/A 11/19/2018   Procedure: COLONOSCOPY WITH PROPOFOL;  Surgeon: Lucilla Lame, MD;  Location: [facility];  Service: Endoscopy;  Laterality: N/A;  Diabetic - insulin   CORONARY ARTERY BYPASS GRAFT N/A 01/18/2021   Procedure: CORONARY ARTERY BYPASS GRAFTING (CABG) x 4  USING LEFT INTERNAL MAMMARY ARTERY AND LEFT ENDOSCOPIC GREATER SAPHENOUS VEIN CONDUITS;  Surgeon: [name], MD;  Location: [place];  Service: Open Heart Surgery;  Laterality: N/A;   CORONARY/GRAFT ACUTE MI REVASCULARIZATION N/A 01/13/2021   Procedure: Coronary/Graft Acute MI Revascularization;  Surgeon: Isaias Cowman, MD;  Location: [place] [facility];  Service: Cardiovascular;  Laterality: N/A;   ENDARTERECTOMY Left 05/06/2012   Procedure: ENDARTERECTOMY CAROTID;  Surgeon: Angelia Mould, MD;  Location: [place];  Service: Vascular;  Laterality: Left;   ENDARTERECTOMY Right 08/09/2013   Procedure: ENDARTERECTOMY CAROTID-RIGHT;  Surgeon: Angelia Mould, MD;  Location: [place];  Service: Vascular;  Laterality: Right;   ENDOVEIN HARVEST OF GREATER SAPHENOUS VEIN Left 01/18/2021   Procedure: ENDOVEIN HARVEST OF GREATER SAPHENOUS VEIN;  Surgeon: [name], MD;  Location: [place];  Service: Open Heart Surgery;  Laterality: Left;   HERNIA REPAIR     IABP INSERTION Right 01/13/2021   Procedure: IABP Insertion;  Surgeon: Isaias Cowman, MD;  Location: [place] [facility];  Service: Cardiovascular;  Laterality: Right;   LEFT HEART CATH AND CORONARY ANGIOGRAPHY N/A 01/13/2021   Procedure: LEFT HEART CATH AND CORONARY ANGIOGRAPHY;  Surgeon: Isaias Cowman, MD;  Location: [place] [facility];  Service: Cardiovascular;  Laterality: N/A;   LOWER EXTREMITY  ANGIOGRAPHY Left 01/03/2021   Procedure: LOWER EXTREMITY ANGIOGRAPHY;  Surgeon: Algernon Huxley, MD;  Location: [place] [facility];  Service: Cardiovascular;  Laterality: Left;   LOWER EXTREMITY ANGIOGRAPHY Left 02/28/2021   Procedure: LOWER EXTREMITY ANGIOGRAPHY;  Surgeon: Algernon Huxley, MD;  Location: [place] [facility];  Service: Cardiovascular;  Laterality: Left;   PATCH ANGIOPLASTY Left 05/06/2012   Procedure: WITH DACRON PATCH ANGIOPLASTY ;  Surgeon: Angelia Mould, MD;  Location: [place];  Service: Vascular;  Laterality: Left;   POLYPECTOMY  11/19/2018   Procedure: POLYPECTOMY;  Surgeon: Lucilla Lame, MD;  Location: [facility];  Service: Endoscopy;;   SPINE SURGERY  2004   TEE WITHOUT CARDIOVERSION N/A 01/18/2021   Procedure: TRANSESOPHAGEAL ECHOCARDIOGRAM (TEE);  Surgeon: [name], MD;  Location: [place];  Service: Open Heart Surgery;  Laterality: N/A;   TONSILLECTOMY     TUBAL LIGATION      Family History  Problem Relation Age of Onset   Hypertension Mother    Hyperlipidemia Mother    Deep vein thrombosis Mother    Cancer Father    Alcohol abuse Father    Heart failure Father    Hypertension Maternal Grandmother    Deep vein thrombosis Sister    Alcohol abuse Sister    Alcohol abuse Sister     Allergies  Allergen Reactions   Simvastatin Diarrhea and Nausea And Vomiting   Morphine And Related Itching   Lactose Intolerance (Gi) Diarrhea    bloating   Latex Rash    Rash when she wears gloves (Negative by test - per pt)    CBC Latest Ref Rng & Units 01/29/2021 01/28/2021 01/27/2021  WBC 4.0 - 10.5 K/uL 11.9(H) 12.4(H) 11.9(H)  Hemoglobin 12.0 - 15.0 g/dL 8.8(L) 9.2(L) 9.2(L)  Hematocrit 36.0 - 46.0 % 28.8(L) 30.7(L) 29.6(L)  Platelets 150 - 400 K/uL 385 447(H) 423(H)      CMP     Component Value Date/Time   NA 138 01/28/2021 0321   NA 138 07/30/2013 0436   K 4.0 01/28/2021 0321   K 4.0 07/30/2013 0436   CL 98 01/28/2021 0321   CL 103  07/30/2013 0436   CO2 35 (H) 01/28/2021 0321   CO2 26 07/30/2013 0436   GLUCOSE 242 (H) 01/28/2021 0321   GLUCOSE 109 (H) 07/30/2013 0436   BUN 16 02/28/2021 1037   BUN 15 07/30/2013 0436   CREATININE 0.70 02/28/2021 1037   CREATININE 0.67 07/30/2013 0436   CALCIUM 8.4 (L) 01/28/2021 0321   CALCIUM 8.8 07/30/2013 0436   PROT 6.5 01/28/2021 0321   PROT 7.8 07/29/2013 0650   ALBUMIN 2.5 (L) 01/28/2021 0321   ALBUMIN 3.5 07/29/2013 0650   AST 16 01/28/2021 0321   AST 25 07/29/2013 0650   ALT 14 01/28/2021 0321   ALT 38 07/29/2013 0650   ALKPHOS 65 01/28/2021 0321   ALKPHOS 83 07/29/2013 0650   BILITOT 0.3 01/28/2021 0321   BILITOT 0.2 07/29/2013 0650   GFRNONAA >60 02/28/2021 1037   GFRNONAA >60 07/30/2013 0436   GFRAA >60 02/03/2019 1126   GFRAA >60 07/30/2013 0436     VAS [place] ABI WITH/WO TBI  Result Date: 04/01/2021  LOWER EXTREMITY DOPPLER STUDY Patient Name:  Morgan Daniels  Date of Exam:   03/28/2021 Medical Rec #: [id number]       Accession #:    [id number] Date of Birth: Sep 15, 1960       Patient Gender: F Patient Age:   36 years Exam Location:  [facility] Procedure:      VAS [place] ABI WITH/WO TBI Referring Phys: Corene Cornea DEW --------------------------------------------------------------------------------  Indications: Peripheral artery disease.  Vascular Interventions: 01/03/2021 left sfa pop pta stent                         02/28/2021 left thrombectomy, pta and 2 new sfa stents. Comparison Study: 12/2020 Performing Technologist: Concha Norway RVT  Examination Guidelines: A complete evaluation includes at minimum, Doppler waveform signals and systolic blood pressure reading at the level of bilateral brachial, anterior tibial,  and posterior tibial arteries, when vessel segments are accessible. Bilateral testing is considered an integral part of a complete examination. Photoelectric Plethysmograph (PPG) waveforms and toe systolic pressure readings are included as required and  additional duplex testing as needed. Limited examinations for reoccurring indications may be performed as noted.  ABI Findings: +---------+------------------+-----+---------+--------+  Right     Rt Pressure (mmHg) Index Waveform  Comment   +---------+------------------+-----+---------+--------+  Brachial  187                                          +---------+------------------+-----+---------+--------+  ATA       190                1.02  triphasic           +---------+------------------+-----+---------+--------+  PTA       188                1.01  biphasic            +---------+------------------+-----+---------+--------+  Great Toe 167                0.89  Normal              +---------+------------------+-----+---------+--------+ +---------+------------------+-----+---------+-------+  Left      Lt Pressure (mmHg) Index Waveform  Comment  +---------+------------------+-----+---------+-------+  ATA       195                1.04  triphasic          +---------+------------------+-----+---------+-------+  PTA       195                1.04  triphasic          +---------+------------------+-----+---------+-------+  Great Toe 162                0.87  Normal             +---------+------------------+-----+---------+-------+ +-------+-----------+-----------+------------+------------+  ABI/TBI Today's ABI Today's TBI Previous ABI Previous TBI  +-------+-----------+-----------+------------+------------+  Right   1.02        .89         1.06         .70           +-------+-----------+-----------+------------+------------+  Left    1.04        .87         .66          .10           +-------+-----------+-----------+------------+------------+  Left ABIs and TBIs appear increased compared to prior study on 12/2020.  Summary: Right: Resting right ankle-brachial index is within normal range. No evidence of significant right lower extremity arterial disease. The right toe-brachial index is normal. Left: Resting left ankle-brachial  index is within normal range. No evidence of significant left lower extremity arterial disease. The left toe-brachial index is normal. Significantly improved s/p intervention 02/2021.  *See table(s) above for measurements and observations.  Electronically signed by Leotis Pain MD on 04/01/2021 at 10:23:57 AM.    Final    VAS [place] ABI WITH/WO TBI  Result Date: 03/04/2021  LOWER EXTREMITY DOPPLER STUDY Patient Name:  Morgan Daniels  Date of Exam:   02/26/2021 Medical Rec #: [id number]       Accession #:    [id number] Date of Birth: [birth year]  Patient Gender: F Patient Age:   43 years Exam Location:  [facility] Procedure:      VAS [place] ABI WITH/WO TBI Referring Phys: JASON DEW --------------------------------------------------------------------------------  Indications: Peripheral artery disease.  Vascular Interventions: 01/03/2021 left sfa pop pta stent. Comparison Study: 12/25/2020 Performing Technologist: Concha Norway RVT  Examination Guidelines: A complete evaluation includes at minimum, Doppler waveform signals and systolic blood pressure reading at the level of bilateral brachial, anterior tibial, and posterior tibial arteries, when vessel segments are accessible. Bilateral testing is considered an integral part of a complete examination. Photoelectric Plethysmograph (PPG) waveforms and toe systolic pressure readings are included as required and additional duplex testing as needed. Limited examinations for reoccurring indications may be performed as noted.  ABI Findings: +---------+------------------+-----+--------+--------+  Right     Rt Pressure (mmHg) Index Waveform Comment   +---------+------------------+-----+--------+--------+  Brachial  171                                         +---------+------------------+-----+--------+--------+  ATA       181                               1.06      +---------+------------------+-----+--------+--------+  PTA       141                0.82                      +---------+------------------+-----+--------+--------+  Great Toe 120                0.70                     +---------+------------------+-----+--------+--------+ +---------+------------------+-----+-------------------+-------+  Left      Lt Pressure (mmHg) Index Waveform            Comment  +---------+------------------+-----+-------------------+-------+  ATA       113                      dampened monophasic          +---------+------------------+-----+-------------------+-------+  PTA       120                0.70  dampened monophasic          +---------+------------------+-----+-------------------+-------+  Great Toe 19                 0.11  Abnormal                     +---------+------------------+-----+-------------------+-------+ +-------+-----------+-----------+------------+------------+  ABI/TBI Today's ABI Today's TBI Previous ABI Previous TBI  +-------+-----------+-----------+------------+------------+  Right   1.06        .70         1.06         1.02          +-------+-----------+-----------+------------+------------+  Left    .66         .11         .68          .67           +-------+-----------+-----------+------------+------------+  Bilateral ABIs appear essentially unchanged compared to prior study on 12/25/2020. Left TBIs appear decreased.  Summary: Right: Resting right ankle-brachial index indicates  mild right lower extremity arterial disease. The right toe-brachial index is normal. Left: Resting left ankle-brachial index indicates moderate left lower extremity arterial disease. The left toe-brachial index is abnormal. New Left SFA Pop stent occluded by duplex.  *See table(s) above for measurements and observations.  Electronically signed by Leotis Pain MD on 03/04/2021 at 12:11:05 PM.    Final        Assessment & Plan:   1. Peripheral arterial disease with history of revascularization (HCC)  Recommend:  The patient has evidence of atherosclerosis of the lower extremities with  claudication.  The patient does not voice lifestyle limiting changes at this point in time.  Noninvasive studies do not suggest clinically significant change.  No invasive studies, angiography or surgery at this time The patient should continue walking and begin a more formal exercise program.  The patient should continue antiplatelet therapy and aggressive treatment of the lipid abnormalities  No changes in the patient's medications at this time  The patient should continue wearing graduated compression socks 10-15 mmHg strength to control the mild edema.    2. S/P CABG x 4 The patient had a knot in her inner thigh from the leg that had her vein harvested.  Ultrasound shows that this is the hematoma.  No further intervention necessary.  3. HYPERCHOLESTEROLEMIA Continue statin as ordered and reviewed, no changes at this time   4. Type 2 diabetes mellitus with hyperglycemia, with long-term current use of insulin (HCC) Continue hypoglycemic medications as already ordered, these medications have been reviewed and there are no changes at this time.  Hgb A1C to be monitored as already arranged by primary service    Current Outpatient Medications on File Prior to Visit  Medication Sig Dispense Refill   amiodarone (PACERONE) 200 MG tablet Take 1 tablet (200 mg total) by mouth daily. 30 tablet 0   amLODipine (NORVASC) 10 MG tablet TAKE 1 TABLET BY MOUTH EVERY DAY 30 tablet 3   aspirin 81 MG chewable tablet Chew 1 tablet (81 mg total) by mouth daily.     atorvastatin (LIPITOR) 80 MG tablet TAKE 1 TABLET BY MOUTH EVERY DAY 30 tablet 3   cephALEXin (KEFLEX) 500 MG capsule Take 1 capsule (500 mg total) by mouth every 12 (twelve) hours. 8 capsule 0   Cholecalciferol (VITAMIN D-3) 125 MCG (5000 UT) TABS Take 5,000 Units by mouth daily. 30 tablet 0   clopidogrel (PLAVIX) 75 MG tablet Take 1 tablet (75 mg total) by mouth daily. 30 tablet 11   Continuous Blood Gluc Receiver (FREESTYLE LIBRE 2 READER)  DEVI As directed 1 each 0   Continuous Blood Gluc Sensor (FREESTYLE LIBRE 2 SENSOR) MISC USE TO TEST BLOOD GLUCOSE 4 TIMES A DAY . DX E11.65 2 each 3   dicyclomine (BENTYL) 10 MG capsule TAKE 1 CAPSULE (10 MG TOTAL) BY MOUTH 3 (THREE) TIMES DAILY BEFORE MEALS. 270 capsule 1   FLUoxetine (PROZAC) 40 MG capsule Take 2 capsules (80 mg total) by mouth daily. 30 capsule 3   gabapentin (NEURONTIN) 600 MG tablet Take 1 tablet (600 mg total) by mouth 3 (three) times daily. 90 tablet 0   metoprolol tartrate (LOPRESSOR) 25 MG tablet Take 1 tablet (25 mg total) by mouth 2 (two) times daily. 60 tablet 0   Multiple Vitamin (MULTIVITAMIN WITH MINERALS) TABS tablet Take 1 tablet by mouth daily.     oxyCODONE (OXY IR/ROXICODONE) 5 MG immediate release tablet Take 1 tablet (5 mg total) by mouth every 6 (six)  hours as needed for severe pain. 60 tablet 0   pantoprazole sodium (PROTONIX) 40 mg Take 40 mg by mouth daily. 30 packet 0   saccharomyces boulardii (FLORASTOR) 250 MG capsule Take 1 capsule (250 mg total) by mouth 2 (two) times daily. 60 capsule 0   tiZANidine (ZANAFLEX) 4 MG tablet TAKE 1 TABLET(S) BY MOUTH TWICE A DAY AS NEEDED FOR MUSCLE PAIN AND SPASMS 60 tablet 1   traMADol (ULTRAM) 50 MG tablet TAKE 1 TABLET BY MOUTH 3 TIMES DAILY AS NEEDED FOR MODERATE PAIN OR SEVERE PAIN. 90 tablet 0   umeclidinium-vilanterol (ANORO ELLIPTA) 62.5-25 MCG/ACT AEPB Inhale 1 puff into the lungs daily. 3 each 1   No current facility-administered medications on file prior to visit.    There are no Patient Instructions on file for this visit. No follow-ups on file.   Kris Hartmann, NP

## 2021-04-09 ENCOUNTER — Other Ambulatory Visit: Payer: Self-pay | Admitting: Nurse Practitioner

## 2021-04-09 DIAGNOSIS — M7540 Impingement syndrome of unspecified shoulder: Secondary | ICD-10-CM

## 2021-04-10 NOTE — Progress Notes (Signed)
HPI: 61 y.o. female presenting today with her husband for evaluation of peeling of the skin to the bilateral feet.  Patient states that recently she had stents put in for her lower extremities and now her feet are peeling and the superficial layers are coming off.  She also says that they looked bruised and discolored when the skin comes off.  She presents for further treatment and evaluation.  Patient has history of PVD, CKD, T2DM and recently past surgical history of quadruple bypass and stent placement left lower extremity. Past Medical History:  Diagnosis Date   Anxiety    Arthritis    Bilateral carotid artery stenosis 2014   Carotid artery occlusion    Chronic kidney disease 06/18/2015   UTI   Chronic kidney disease 2017   Current smoker    CVA (cerebral vascular accident) ([place]) 2013   Depression    Diabetes ([place])    Diverticulosis    Fatty liver    Fibromyalgia    GERD (gastroesophageal reflux disease)    H/O hiatal hernia    Heart attack ([place]) 01/12/2021   Hiatal hernia    Hypercholesteremia    Hypertension    IBS (irritable bowel syndrome)    PAD (peripheral artery disease) ([place])    Peptic ulcer    Plantar fasciitis    Stroke ([place]) 01/31/2012   Right side-ministroke   T2DM (type 2 diabetes mellitus) ([place])     Past Surgical History:  Procedure Laterality Date   ABDOMINAL HYSTERECTOMY  1990   BACK SURGERY  2000, 2004   CAROTID ENDARTERECTOMY Left 05/06/12   CARPAL TUNNEL RELEASE Left 07/18/2015   Procedure: CARPAL TUNNEL RELEASE;  Surgeon: Earnestine Leys, MD;  Location: [facility] ORS;  Service: Orthopedics;  Laterality: Left;   CEREBRAL ANGIOGRAM Bilateral 05/03/2012   Procedure: CEREBRAL ANGIOGRAM;  Surgeon: Angelia Mould, MD;  Location: [facility] CATH LAB;  Service: Cardiovascular;  Laterality: Bilateral;   CHOLECYSTECTOMY  2001   COLONOSCOPY WITH PROPOFOL N/A 11/19/2018   Procedure: COLONOSCOPY WITH PROPOFOL;  Surgeon: Lucilla Lame, MD;  Location: [place];   Service: Endoscopy;  Laterality: N/A;  Diabetic - insulin   CORONARY ARTERY BYPASS GRAFT N/A 01/18/2021   Procedure: CORONARY ARTERY BYPASS GRAFTING (CABG) x 4  USING LEFT INTERNAL MAMMARY ARTERY AND LEFT ENDOSCOPIC GREATER SAPHENOUS VEIN CONDUITS;  Surgeon: [name], MD;  Location: [place];  Service: Open Heart Surgery;  Laterality: N/A;   CORONARY/GRAFT ACUTE MI REVASCULARIZATION N/A 01/13/2021   Procedure: Coronary/Graft Acute MI Revascularization;  Surgeon: Isaias Cowman, MD;  Location: [facility];  Service: Cardiovascular;  Laterality: N/A;   ENDARTERECTOMY Left 05/06/2012   Procedure: ENDARTERECTOMY CAROTID;  Surgeon: Angelia Mould, MD;  Location: [place];  Service: Vascular;  Laterality: Left;   ENDARTERECTOMY Right 08/09/2013   Procedure: ENDARTERECTOMY CAROTID-RIGHT;  Surgeon: Angelia Mould, MD;  Location: [place];  Service: Vascular;  Laterality: Right;   ENDOVEIN HARVEST OF GREATER SAPHENOUS VEIN Left 01/18/2021   Procedure: ENDOVEIN HARVEST OF GREATER SAPHENOUS VEIN;  Surgeon: [name], MD;  Location: [place];  Service: Open Heart Surgery;  Laterality: Left;   HERNIA REPAIR     IABP INSERTION Right 01/13/2021   Procedure: IABP Insertion;  Surgeon: Isaias Cowman, MD;  Location: [place] CV LAB;  Service: Cardiovascular;  Laterality: Right;   LEFT HEART CATH AND CORONARY ANGIOGRAPHY N/A 01/13/2021   Procedure: LEFT HEART CATH AND CORONARY ANGIOGRAPHY;  Surgeon: Isaias Cowman, MD;  Location: [facility]  INVASIVE CV LAB;  Service: Cardiovascular;  Laterality: N/A;   LOWER EXTREMITY ANGIOGRAPHY Left 01/03/2021   Procedure: LOWER EXTREMITY ANGIOGRAPHY;  Surgeon: Algernon Huxley, MD;  Location: [place] CV LAB;  Service: Cardiovascular;  Laterality: Left;   LOWER EXTREMITY ANGIOGRAPHY Left 02/28/2021   Procedure: LOWER EXTREMITY ANGIOGRAPHY;  Surgeon: Algernon Huxley, MD;  Location: [place] CV LAB;  Service: Cardiovascular;   Laterality: Left;   PATCH ANGIOPLASTY Left 05/06/2012   Procedure: WITH DACRON PATCH ANGIOPLASTY ;  Surgeon: Angelia Mould, MD;  Location: [place];  Service: Vascular;  Laterality: Left;   POLYPECTOMY  11/19/2018   Procedure: POLYPECTOMY;  Surgeon: Lucilla Lame, MD;  Location: [place];  Service: Endoscopy;;   SPINE SURGERY  2004   TEE WITHOUT CARDIOVERSION N/A 01/18/2021   Procedure: TRANSESOPHAGEAL ECHOCARDIOGRAM (TEE);  Surgeon: [name], MD;  Location: [place];  Service: Open Heart Surgery;  Laterality: N/A;   TONSILLECTOMY     TUBAL LIGATION      Allergies  Allergen Reactions   Simvastatin Diarrhea and Nausea And Vomiting   Morphine And Related Itching   Lactose Intolerance (Gi) Diarrhea    bloating   Latex Rash    Rash when she wears gloves (Negative by test - per pt)    Physical Exam: General: The patient is alert and oriented x3 in no acute distress.  Dermatology: Currently there are no open wounds.  Discoloration noted to the bilateral feet.  Superficial peeling of the skin is also noted.  Vascular: PAD.  Closely monitored by Dr. Lucky Cowboy, [facility].  Lower extremity angiography left on 01/03/2021.  Neurological: Light touch and protective threshold grossly intact  Musculoskeletal Exam: No pedal deformities noted  Assessment: 1.  PAD LLE 2.  Superficial peeling of skin bilateral feet   Plan of Care:  1. Patient evaluated. 2.  Explained to the patient that after revascularization she can experience discoloration with changes to the feet. 3.  Recommend Miracle Feet Therapeutic Cream that was dispensed at checkout 4.  Appointment with Pedorthist for diabetic shoes and custom molded insoles 5.  Return to clinic as needed      Edrick Kins, DPM [facility]  Dr. Edrick Kins, DPM    [address], [place] [address]                Office [phone]  Fax  [phone]

## 2021-04-15 DIAGNOSIS — Z20822 Contact with and (suspected) exposure to covid-19: Secondary | ICD-10-CM | POA: Diagnosis not present

## 2021-04-16 ENCOUNTER — Other Ambulatory Visit: Payer: Self-pay

## 2021-04-16 ENCOUNTER — Encounter
Payer: Medicare Other | Attending: Physical Medicine and Rehabilitation | Admitting: Physical Medicine and Rehabilitation

## 2021-04-16 VITALS — BP 155/80 | HR 82 | Ht 67.0 in | Wt 187.0 lb

## 2021-04-16 DIAGNOSIS — E1165 Type 2 diabetes mellitus with hyperglycemia: Secondary | ICD-10-CM | POA: Diagnosis not present

## 2021-04-16 DIAGNOSIS — R5381 Other malaise: Secondary | ICD-10-CM | POA: Insufficient documentation

## 2021-04-16 DIAGNOSIS — Z794 Long term (current) use of insulin: Secondary | ICD-10-CM | POA: Insufficient documentation

## 2021-04-16 MED ORDER — NITROGLYCERIN 0.1 MG/HR TD PT24: 0.1000 mg | MEDICATED_PATCH | Freq: Every day | TRANSDERMAL | 12 refills | Status: DC

## 2021-04-16 NOTE — Progress Notes (Signed)
Subjective:    Patient ID: Morgan Daniels, female    DOB: [birth year], 61 y.o.   MRN: [id number]  HPI Morgan Daniels is a 61 year old woman who presents for hospital follow-up after CIR admission for debility.   1) Pain in both feet -notes poor circulation -graduated from therapy.   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   3) HTN -high today -medication increased recently by PCP   Pain Inventory Average Pain 3 Pain Right Now 0 My pain is intermittent and shooting pain  In the last 24 hours, has pain interfered with the following? General activity 0 Relation with others 0 Enjoyment of life 7 What TIME of day is your pain at its worst? evening and varies Sleep (in general) Good  Pain is worse with: walking Pain improves with: rest and medication Relief from Meds: 10  walk without assistance ability to climb steps?  yes do you drive?  yes Do you have any goals in this area?  yes  disabled: date disabled about 25 I need assistance with the following:  meal prep, household duties, and shopping  weakness numbness tremor tingling trouble walking spasms dizziness confusion depression anxiety  Any changes since last visit?  no, Cardio X-ray  Any changes since last visit?  no    Family History  Problem Relation Age of Onset   Hypertension Mother    Hyperlipidemia Mother    Deep vein thrombosis Mother    Cancer Father    Alcohol abuse Father    Heart failure Father    Hypertension Maternal Grandmother    Deep vein thrombosis Sister    Alcohol abuse Sister    Alcohol abuse Sister    Social History   Socioeconomic History   Marital status: Married    Spouse name: Not on file   Number of children: Not on file   Years of education: Not on file   Highest education level: Not on file  Occupational History   Not on file  Tobacco Use   Smoking status: Former     Packs/day: 1.00    Years: 40.00    Pack years: 40.00    Types: Cigarettes    Start date: 11/09/1970    Passive exposure: Current   Smokeless tobacco: Never   Tobacco comments:       Vaping Use   Vaping Use: Former  Substance and Sexual Activity   Alcohol use: Not Currently    Comment: rarely   Drug use: No   Sexual activity: Not Currently    Partners: Male  Other Topics Concern   Not on file  Social History Narrative   ** Merged History Encounter **       Social Determinants of Health   Financial Resource Strain: Not on file  Food Insecurity: Not on file  Transportation Needs: Not on file  Physical Activity: Not on file  Stress: Not on file  Social Connections: Not on file   Past Surgical History:  Procedure Laterality Date   [place]  2000, 2004   CAROTID ENDARTERECTOMY Left 05/06/12   CARPAL TUNNEL RELEASE Left 07/18/2015   Procedure: CARPAL TUNNEL RELEASE;  Surgeon: Earnestine Leys, MD;  Location: [facility] ORS;  Service: Orthopedics;  Laterality: Left;   CEREBRAL ANGIOGRAM Bilateral 05/03/2012   Procedure: CEREBRAL ANGIOGRAM;  Surgeon: Judeth Cornfield  Scot Dock, MD;  Location: [facility] CATH LAB;  Service: Cardiovascular;  Laterality: Bilateral;   CHOLECYSTECTOMY  2001   COLONOSCOPY WITH PROPOFOL N/A 11/19/2018   Procedure: COLONOSCOPY WITH PROPOFOL;  Surgeon: Lucilla Lame, MD;  Location: [place];  Service: Endoscopy;  Laterality: N/A;  Diabetic - insulin   CORONARY ARTERY BYPASS GRAFT N/A 01/18/2021   Procedure: CORONARY ARTERY BYPASS GRAFTING (CABG) x 4  USING LEFT INTERNAL MAMMARY ARTERY AND LEFT ENDOSCOPIC GREATER SAPHENOUS VEIN CONDUITS;  Surgeon: [name], MD;  Location: [place];  Service: Open Heart Surgery;  Laterality: N/A;   CORONARY/GRAFT ACUTE MI REVASCULARIZATION N/A 01/13/2021   Procedure: Coronary/Graft Acute MI Revascularization;  Surgeon: Isaias Cowman, MD;  Location: [place] CV LAB;  Service:  Cardiovascular;  Laterality: N/A;   ENDARTERECTOMY Left 05/06/2012   Procedure: ENDARTERECTOMY CAROTID;  Surgeon: Angelia Mould, MD;  Location: [place];  Service: Vascular;  Laterality: Left;   ENDARTERECTOMY Right 08/09/2013   Procedure: ENDARTERECTOMY CAROTID-RIGHT;  Surgeon: Angelia Mould, MD;  Location: [place];  Service: Vascular;  Laterality: Right;   ENDOVEIN HARVEST OF GREATER SAPHENOUS VEIN Left 01/18/2021   Procedure: ENDOVEIN HARVEST OF GREATER SAPHENOUS VEIN;  Surgeon: [name], MD;  Location: [place];  Service: Open Heart Surgery;  Laterality: Left;   HERNIA REPAIR     IABP INSERTION Right 01/13/2021   Procedure: IABP Insertion;  Surgeon: Isaias Cowman, MD;  Location: [place] CV LAB;  Service: Cardiovascular;  Laterality: Right;   LEFT HEART CATH AND CORONARY ANGIOGRAPHY N/A 01/13/2021   Procedure: LEFT HEART CATH AND CORONARY ANGIOGRAPHY;  Surgeon: Isaias Cowman, MD;  Location: [place] CV LAB;  Service: Cardiovascular;  Laterality: N/A;   LOWER EXTREMITY ANGIOGRAPHY Left 01/03/2021   Procedure: LOWER EXTREMITY ANGIOGRAPHY;  Surgeon: Algernon Huxley, MD;  Location: [place] CV LAB;  Service: Cardiovascular;  Laterality: Left;   LOWER EXTREMITY ANGIOGRAPHY Left 02/28/2021   Procedure: LOWER EXTREMITY ANGIOGRAPHY;  Surgeon: Algernon Huxley, MD;  Location: [place] CV LAB;  Service: Cardiovascular;  Laterality: Left;   PATCH ANGIOPLASTY Left 05/06/2012   Procedure: WITH DACRON PATCH ANGIOPLASTY ;  Surgeon: Angelia Mould, MD;  Location: [place];  Service: Vascular;  Laterality: Left;   POLYPECTOMY  11/19/2018   Procedure: POLYPECTOMY;  Surgeon: Lucilla Lame, MD;  Location: [place];  Service: Endoscopy;;   SPINE SURGERY  2004   TEE WITHOUT CARDIOVERSION N/A 01/18/2021   Procedure: TRANSESOPHAGEAL ECHOCARDIOGRAM (TEE);  Surgeon: [name], MD;  Location: [place];  Service: Open Heart Surgery;  Laterality: N/A;    TONSILLECTOMY     TUBAL LIGATION     Past Medical History:  Diagnosis Date   Anxiety    Arthritis    Bilateral carotid artery stenosis 2014   Carotid artery occlusion    Chronic kidney disease 06/18/2015   UTI   Chronic kidney disease 2017   Current smoker    CVA (cerebral vascular accident) ([place]) 2013   Depression    Diabetes ([place])    Diverticulosis    Fatty liver    Fibromyalgia    GERD (gastroesophageal reflux disease)    H/O hiatal hernia    Heart attack ([place]) 01/12/2021   Hiatal hernia    Hypercholesteremia    Hypertension    IBS (irritable bowel syndrome)    PAD (peripheral artery disease) ([place])    Peptic ulcer    Plantar fasciitis    Stroke ([place]) 01/31/2012   Right side-ministroke  T2DM (type 2 diabetes mellitus) ([facility])    BP (!) 155/80    Pulse 82    Ht 5\' 7"  (1.702 m)    Wt 187 lb (84.8 kg)    SpO2 95%    BMI 29.29 kg/m   Opioid Risk Score:   Fall Risk Score:  `1  Depression screen PHQ 2/9  Depression screen [facility] 2/9 12/25/2020 11/08/2020 08/28/2020 06/28/2020 12/23/2019 07/28/2019 03/28/2019  Decreased Interest 0 0 0 3 0 3 1  Down, Depressed, Hopeless 0 0 0 3 0 - 0  PHQ - 2 Score 0 0 0 6 0 3 1  Altered sleeping - - - 0 - 3 -  Tired, decreased energy - - - 3 - 3 -  Change in appetite - - - 2 - 3 -  Feeling bad or failure about yourself  - - - 0 - 3 -  Trouble concentrating - - - 2 - 0 -  Moving slowly or fidgety/restless - - - 0 - 3 -  Suicidal thoughts - - - 0 - 0 -  PHQ-9 Score - - - 13 - 18 -  Difficult doing work/chores - - - Very difficult - - -  Some recent data might be hidden    Review of Systems  Musculoskeletal:  Positive for gait problem.       Tingling, spasms  Neurological:  Positive for dizziness, tremors, weakness and numbness. Negative for speech difficulty.  Psychiatric/Behavioral:  Positive for confusion.        Depression, anxiety  All other systems reviewed and are negative.     Objective:   Physical Exam  Gen: no distress,  normal appearing, BMI 29.29, weight 187 lbs, BP 155/80 HEENT: oral mucosa pink and moist, NCAT Cardio: Reg rate Chest: normal effort, normal rate of breathing Abd: soft, non-distended Ext: no edema Psych: pleasant, normal affect Skin: intact Neuro: Ambulating without assistive devcice      Assessment & Plan:  1) Debility -provided with 6 months handicap placard  2) Impaired circulation of lower extremities -prescribed nitrodur patch  3) HTN: -BP is 155/80 today.  -Advised checking BP daily at home and logging results to bring into follow-up appointment with PCP. -Advised regarding healthy foods that can help lower blood pressure and provided with a list: 1) citrus foods- high in vitamins and minerals 2) salmon and other fatty fish - reduces inflammation and oxylipins 3) swiss chard (leafy green)- high level of nitrates 4) pumpkin seeds- one of the best natural sources of magnesium 5) Beans and lentils- high in fiber, magnesium, and potassium 6) Berries- high in flavonoids 7) Amaranth (whole grain, can be cooked similarly to rice and oats)- high in magnesium and fiber 8) Pistachios- even more effective at reducing BP than other nuts 9) Carrots- high in phenolic compounds that relax blood vessels and reduce inflammation 10) Celery- contain phthalides that relax tissues of arterial walls 11) Tomatoes- can also improve cholesterol and reduce risk of heart disease 12) Broccoli- good source of magnesium, calcium, and potassium 13) Greek yogurt: high in potassium and calcium 14) Herbs and spices: Celery seed, cilantro, saffron, lemongrass, black cumin, ginseng, cinnamon, cardamom, sweet basil, and ginger 15) Chia and flax seeds- also help to lower cholesterol and blood sugar 16) Beets- high levels of nitrates that relax blood vessels  17) spinach and bananas- high in potassium  -Provided lise of supplements that can help with hypertension:  1) magnesium: one high quality brand is  Bioptemizers since it  contains all 7 types of magnesium, otherwise over the counter magnesium gluconate 400mg  is a good option 2) B vitamins 3) vitamin D 4) potassium 5) CoQ10 6) L-arginine 7) Vitamin C 8) Beetroot -Educated that goal BP is 120/80. -Made goal to incorporate some of the above foods into diet.

## 2021-04-17 ENCOUNTER — Other Ambulatory Visit: Payer: Self-pay | Admitting: Nurse Practitioner

## 2021-04-17 DIAGNOSIS — M064 Inflammatory polyarthropathy: Secondary | ICD-10-CM

## 2021-04-23 ENCOUNTER — Other Ambulatory Visit: Payer: Self-pay | Admitting: Nurse Practitioner

## 2021-04-23 ENCOUNTER — Other Ambulatory Visit: Payer: Self-pay | Admitting: "Endocrinology

## 2021-04-23 DIAGNOSIS — E1140 Type 2 diabetes mellitus with diabetic neuropathy, unspecified: Secondary | ICD-10-CM

## 2021-04-23 DIAGNOSIS — E1142 Type 2 diabetes mellitus with diabetic polyneuropathy: Secondary | ICD-10-CM

## 2021-04-23 NOTE — Telephone Encounter (Signed)
Last OV 10/29/2020 ? ?Can you look into this one?  ?

## 2021-04-29 ENCOUNTER — Other Ambulatory Visit: Payer: Medicare Other

## 2021-05-06 ENCOUNTER — Other Ambulatory Visit: Payer: Self-pay | Admitting: Nurse Practitioner

## 2021-05-06 ENCOUNTER — Other Ambulatory Visit: Payer: Self-pay | Admitting: "Endocrinology

## 2021-05-06 DIAGNOSIS — E1142 Type 2 diabetes mellitus with diabetic polyneuropathy: Secondary | ICD-10-CM

## 2021-05-07 ENCOUNTER — Other Ambulatory Visit: Payer: Self-pay

## 2021-05-07 DIAGNOSIS — E1142 Type 2 diabetes mellitus with diabetic polyneuropathy: Secondary | ICD-10-CM

## 2021-05-07 DIAGNOSIS — Z789 Other specified health status: Secondary | ICD-10-CM

## 2021-05-07 DIAGNOSIS — E7800 Pure hypercholesterolemia, unspecified: Secondary | ICD-10-CM

## 2021-05-09 ENCOUNTER — Other Ambulatory Visit: Payer: Self-pay | Admitting: Nurse Practitioner

## 2021-05-09 DIAGNOSIS — M7540 Impingement syndrome of unspecified shoulder: Secondary | ICD-10-CM

## 2021-05-13 ENCOUNTER — Other Ambulatory Visit: Payer: Self-pay | Admitting: Nurse Practitioner

## 2021-05-15 ENCOUNTER — Other Ambulatory Visit: Payer: Self-pay | Admitting: Nurse Practitioner

## 2021-05-15 DIAGNOSIS — M064 Inflammatory polyarthropathy: Secondary | ICD-10-CM

## 2021-05-16 ENCOUNTER — Other Ambulatory Visit: Payer: Self-pay | Admitting: Internal Medicine

## 2021-05-16 MED ORDER — TRAMADOL HCL 50 MG PO TABS: ORAL_TABLET | ORAL | 0 refills | Status: DC

## 2021-05-20 ENCOUNTER — Telehealth: Payer: Self-pay

## 2021-05-20 NOTE — Telephone Encounter (Signed)
Lvm to schedule med refill follow up sooner than 06/25/21 per dfk-Toni ?

## 2021-05-21 ENCOUNTER — Ambulatory Visit: Payer: Medicare Other | Admitting: Nurse Practitioner

## 2021-05-21 DIAGNOSIS — Z20822 Contact with and (suspected) exposure to covid-19: Secondary | ICD-10-CM | POA: Diagnosis not present

## 2021-05-22 ENCOUNTER — Ambulatory Visit: Payer: Medicare Other | Admitting: Nurse Practitioner

## 2021-05-22 ENCOUNTER — Encounter: Payer: Self-pay | Admitting: Nurse Practitioner

## 2021-05-22 VITALS — BP 160/94 | HR 80 | Temp 98.0°F | Resp 16 | Ht 67.0 in | Wt 192.0 lb

## 2021-05-22 DIAGNOSIS — B3731 Acute candidiasis of vulva and vagina: Secondary | ICD-10-CM

## 2021-05-22 DIAGNOSIS — N3001 Acute cystitis with hematuria: Secondary | ICD-10-CM

## 2021-05-22 DIAGNOSIS — M79672 Pain in left foot: Secondary | ICD-10-CM

## 2021-05-22 DIAGNOSIS — E1142 Type 2 diabetes mellitus with diabetic polyneuropathy: Secondary | ICD-10-CM | POA: Diagnosis not present

## 2021-05-22 DIAGNOSIS — Z794 Long term (current) use of insulin: Secondary | ICD-10-CM | POA: Diagnosis not present

## 2021-05-22 DIAGNOSIS — R300 Dysuria: Secondary | ICD-10-CM

## 2021-05-22 DIAGNOSIS — M79671 Pain in right foot: Secondary | ICD-10-CM

## 2021-05-22 LAB — POCT URINALYSIS DIPSTICK
Bilirubin, UA: NEGATIVE
Glucose, UA: NEGATIVE
Ketones, UA: NEGATIVE
Nitrite, UA: NEGATIVE
Protein, UA: POSITIVE — AB
Spec Grav, UA: >=1.03 — AB (ref 1.010–1.025)
Urobilinogen, UA: 2 E.U./dL — AB
pH, UA: 6 (ref 5.0–8.0)

## 2021-05-22 MED ORDER — NITROFURANTOIN MONOHYD MACRO 100 MG PO CAPS: 100.0000 mg | ORAL_CAPSULE | Freq: Two times a day (BID) | ORAL | 0 refills | Status: AC

## 2021-05-22 MED ORDER — TRAMADOL HCL 50 MG PO TABS: 50.0000 mg | ORAL_TABLET | Freq: Three times a day (TID) | ORAL | 2 refills | Status: DC | PRN

## 2021-05-22 MED ORDER — TRESIBA FLEXTOUCH 200 UNIT/ML ~~LOC~~ SOPN: 100.0000 [IU] | PEN_INJECTOR | Freq: Every day | SUBCUTANEOUS | 0 refills | Status: DC

## 2021-05-22 MED ORDER — MUPIROCIN 2 % EX OINT: 1.0000 "application " | TOPICAL_OINTMENT | Freq: Two times a day (BID) | CUTANEOUS | 0 refills | Status: DC

## 2021-05-22 MED ORDER — FLUCONAZOLE 150 MG PO TABS: 150.0000 mg | ORAL_TABLET | Freq: Once | ORAL | 0 refills | Status: AC

## 2021-05-22 NOTE — Progress Notes (Signed)
[facility] ?[address] ?[place],  [address] ? ?Internal MEDICINE  ?Office Visit Note ? ?Patient Name: Morgan Daniels ? [id number]  ?[id number] ? ?Date of Service: 05/22/2021 ? ?Chief Complaint  ?Patient presents with  ? Acute Visit  ? Urinary Tract Infection  ?  Burning, painful at the end of voiding, some blood in urine, white film on urine, pelvic pain and pressure   ? ? ? ?HPI ?Morgan Daniels presents for an acute sick visit for possible symptoms of UTI.  She reports urinary urgency and frequency, burning and urinary discomfort, blood in the urine, mucus in the urine, and pelvic pain pressure.  Urinalysis was done in the office today was positive for blood and leukocytes. ?She also reports having sore tender breakdown on her heels that has been there since November December last year due to poor circulation and has not completely healed. ? ? ? ?Current Medication: ? ?Outpatient Encounter Medications as of 05/22/2021  ?Medication Sig  ? amiodarone (PACERONE) 200 MG tablet Take 1 tablet (200 mg total) by mouth daily.  ? amLODipine (NORVASC) 10 MG tablet TAKE 1 TABLET BY MOUTH EVERY DAY  ? aspirin 81 MG chewable tablet Chew 1 tablet (81 mg total) by mouth daily.  ? atorvastatin (LIPITOR) 80 MG tablet TAKE 1 TABLET BY MOUTH EVERY DAY  ? Cholecalciferol (VITAMIN D-3) 125 MCG (5000 UT) TABS Take 5,000 Units by mouth daily.  ? clopidogrel (PLAVIX) 75 MG tablet Take 1 tablet (75 mg total) by mouth daily.  ? Continuous Blood Gluc Receiver (FREESTYLE LIBRE 2 READER) DEVI As directed  ? Continuous Blood Gluc Sensor (FREESTYLE LIBRE 2 SENSOR) MISC USE TO TEST BLOOD GLUCOSE 4 TIMES A DAY . DX E11.65  ? dicyclomine (BENTYL) 10 MG capsule TAKE 1 CAPSULE (10 MG TOTAL) BY MOUTH 3 (THREE) TIMES DAILY BEFORE MEALS.  ? fluconazole (DIFLUCAN) 150 MG tablet Take 1 tablet (150 mg total) by mouth once for 1 dose. May take an additional dose after 3 days if still symptomatic.  ? FLUoxetine (PROZAC) 40 MG capsule TAKE 2 CAPSULES BY  MOUTH DAILY  ? gabapentin (NEURONTIN) 600 MG tablet TAKE 1 TABLET BY MOUTH THREE TIMES A DAY  ? insulin aspart (NOVOLOG FLEXPEN) 100 UNIT/ML FlexPen Inject 20 Units into the skin 3 (three) times daily before meals. Needs appointment for further refills.  ? metoprolol tartrate (LOPRESSOR) 25 MG tablet Take 1 tablet (25 mg total) by mouth 2 (two) times daily.  ? Multiple Vitamin (MULTIVITAMIN WITH MINERALS) TABS tablet Take 1 tablet by mouth daily.  ? mupirocin ointment (BACTROBAN) 2 % Apply 1 application. topically 2 (two) times daily.  ? nitrofurantoin, macrocrystal-monohydrate, (MACROBID) 100 MG capsule Take 1 capsule (100 mg total) by mouth 2 (two) times daily for 7 days.  ? nitroGLYCERIN (NITRODUR - DOSED IN MG/24 HR) 0.1 mg/hr patch Place 1 patch (0.1 mg total) onto the skin daily.  ? pantoprazole sodium (PROTONIX) 40 mg Take 40 mg by mouth daily.  ? saccharomyces boulardii (FLORASTOR) 250 MG capsule Take 1 capsule (250 mg total) by mouth 2 (two) times daily.  ? tiZANidine (ZANAFLEX) 4 MG tablet TAKE 1 TABLET(S) BY MOUTH TWICE A DAY AS NEEDED FOR MUSCLE PAIN AND SPASMS  ? ULTICARE MICRO PEN NEEDLES 32G X 4 MM MISC TO USE WITH INSULIN DOSING THREE TIMES DAILY PRIOR TO MEALS AND AT BEDTIME FOR BASAL INSULIN.  ? umeclidinium-vilanterol (ANORO ELLIPTA) 62.5-25 MCG/ACT AEPB Inhale 1 puff into the lungs daily.  ? [DISCONTINUED] insulin  degludec (TRESIBA FLEXTOUCH) 200 UNIT/ML FlexTouch Pen Inject 100 Units into the skin at bedtime.  ? [DISCONTINUED] traMADol (ULTRAM) 50 MG tablet Take one tab po bid prn for pain  ? insulin degludec (TRESIBA FLEXTOUCH) 200 UNIT/ML FlexTouch Pen Inject 100 Units into the skin at bedtime.  ? traMADol (ULTRAM) 50 MG tablet Take 1 tablet (50 mg total) by mouth 3 (three) times daily as needed for moderate pain or severe pain. Take one tab po bid prn for pain  ? ?No facility-administered encounter medications on file as of 05/22/2021.  ? ? ? ? ?Medical History: ?Past Medical History:   ?Diagnosis Date  ? Anxiety   ? Arthritis   ? Bilateral carotid artery stenosis 2014  ? Carotid artery occlusion   ? Chronic kidney disease 06/18/2015  ? UTI  ? Chronic kidney disease 2017  ? Current smoker   ? CVA [facility] accident) [facility]) 2013  ? Depression   ? Diabetes ([place])   ? Diverticulosis   ? Fatty liver   ? Fibromyalgia   ? GERD (gastroesophageal reflux disease)   ? H/O hiatal hernia   ? Heart attack ([place]) 01/12/2021  ? Hiatal hernia   ? Hypercholesteremia   ? Hypertension   ? IBS (irritable bowel syndrome)   ? PAD (peripheral artery disease) ([place])   ? Peptic ulcer   ? Plantar fasciitis   ? Stroke ([place]) 01/31/2012  ? Right side-ministroke  ? T2DM (type 2 diabetes mellitus) ([place])   ? ? ? ?Vital Signs: ?BP (!) 160/94   Pulse 80   Temp 98 ?F (36.7 ?C)   Resp 16   Ht '5\' 7"'$  (1.702 m)   Wt 192 lb (87.1 kg)   SpO2 96%   BMI 30.07 kg/m?  ? ? ?Review of Systems  ?Constitutional:  Negative for chills, fatigue and unexpected weight change.  ?HENT:  Positive for postnasal drip. Negative for congestion, rhinorrhea, sneezing and sore throat.   ?Eyes:  Negative for redness.  ?Respiratory:  Negative for cough, chest tightness and shortness of breath.   ?Cardiovascular:  Negative for chest pain and palpitations.  ?Gastrointestinal:  Negative for abdominal pain, constipation, diarrhea, nausea and vomiting.  ?Genitourinary:  Positive for difficulty urinating, dysuria, frequency, pelvic pain and urgency. Negative for flank pain.  ?Musculoskeletal:  Positive for back pain. Negative for arthralgias, joint swelling and neck pain.  ?     Pain in her heels bilaterally  ?Skin:  Negative for rash.  ?     Red and peeling skin on her heels, tender and painful  ?Neurological: Negative.  Negative for tremors and numbness.  ?Hematological:  Negative for adenopathy. Does not bruise/bleed easily.  ?Psychiatric/Behavioral:  Negative for behavioral problems (Depression), sleep disturbance and suicidal ideas. The patient is not  nervous/anxious.   ? ?Physical Exam ?Vitals reviewed.  ?Constitutional:   ?   Appearance: Normal appearance. She is obese.  ?HENT:  ?   Head: Normocephalic and atraumatic.  ?Eyes:  ?   Pupils: Pupils are equal, round, and reactive to light.  ?Cardiovascular:  ?   Rate and Rhythm: Normal rate and regular rhythm.  ?Pulmonary:  ?   Effort: Pulmonary effort is normal. No respiratory distress.  ?Skin: ?   Findings: Bruising and erythema present.  ?   Comments: Redness, erythema, and bruising and peeling skin noted on heels, area is tender and painful  ?Neurological:  ?   Mental Status: She is alert and oriented to person, place, and time.  ?  Psychiatric:     ?   Mood and Affect: Mood normal.     ?   Behavior: Behavior normal.  ? ? ? ? ?Assessment/Plan: ?1. Acute cystitis with hematuria ?Empiric antibiotic treatment prescribed. Urine sent for culture.  ?- CULTURE, URINE COMPREHENSIVE ?- nitrofurantoin, macrocrystal-monohydrate, (MACROBID) 100 MG capsule; Take 1 capsule (100 mg total) by mouth 2 (two) times daily for 7 days.  Dispense: 14 capsule; Refill: 0 ? ?2. Type 2 diabetes mellitus with diabetic polyneuropathy (HCC) ?Refills ordered ?- insulin degludec (TRESIBA FLEXTOUCH) 200 UNIT/ML FlexTouch Pen; Inject 100 Units into the skin at bedtime.  Dispense: 15 mL; Refill: 0 ? ?3. Inflammatory pain of left heel ?Tramadol prescribed for pain and mupirocin prescribed to prevent infection ?- mupirocin ointment (BACTROBAN) 2 %; Apply 1 application. topically 2 (two) times daily.  Dispense: 22 g; Refill: 0 ?- traMADol (ULTRAM) 50 MG tablet; Take 1 tablet (50 mg total) by mouth 3 (three) times daily as needed for moderate pain or severe pain. Take one tab po bid prn for pain  Dispense: 90 tablet; Refill: 2 ? ?4. Inflammatory pain of right heel ?See problem #3 ?- mupirocin ointment (BACTROBAN) 2 %; Apply 1 application. topically 2 (two) times daily.  Dispense: 22 g; Refill: 0 ?- traMADol (ULTRAM) 50 MG tablet; Take 1 tablet (50 mg  total) by mouth 3 (three) times daily as needed for moderate pain or severe pain. Take one tab po bid prn for pain  Dispense: 90 tablet; Refill: 2 ? ?5. Vulvovaginal candidiasis ?Prophylactic treatment in case the

## 2021-05-23 DIAGNOSIS — Z20822 Contact with and (suspected) exposure to covid-19: Secondary | ICD-10-CM | POA: Diagnosis not present

## 2021-05-27 ENCOUNTER — Other Ambulatory Visit: Payer: Self-pay | Admitting: Nurse Practitioner

## 2021-05-31 LAB — CULTURE, URINE COMPREHENSIVE

## 2021-06-01 ENCOUNTER — Other Ambulatory Visit: Payer: Self-pay | Admitting: Nurse Practitioner

## 2021-06-03 ENCOUNTER — Other Ambulatory Visit: Payer: Self-pay | Admitting: Nurse Practitioner

## 2021-06-03 ENCOUNTER — Telehealth: Payer: Self-pay

## 2021-06-03 DIAGNOSIS — Z20822 Contact with and (suspected) exposure to covid-19: Secondary | ICD-10-CM | POA: Diagnosis not present

## 2021-06-03 MED ORDER — SULFAMETHOXAZOLE-TRIMETHOPRIM 800-160 MG PO TABS: 1.0000 | ORAL_TABLET | Freq: Two times a day (BID) | ORAL | 0 refills | Status: AC

## 2021-06-03 NOTE — Telephone Encounter (Signed)
Spoke to pt and informed her that we sent in bactrim to her pharmacy ?

## 2021-06-05 ENCOUNTER — Other Ambulatory Visit: Payer: Self-pay | Admitting: Nurse Practitioner

## 2021-06-05 DIAGNOSIS — M7540 Impingement syndrome of unspecified shoulder: Secondary | ICD-10-CM

## 2021-06-10 ENCOUNTER — Other Ambulatory Visit: Payer: Self-pay | Admitting: Nurse Practitioner

## 2021-06-10 DIAGNOSIS — Z794 Long term (current) use of insulin: Secondary | ICD-10-CM

## 2021-06-17 ENCOUNTER — Other Ambulatory Visit: Payer: Self-pay

## 2021-06-17 DIAGNOSIS — I10 Essential (primary) hypertension: Secondary | ICD-10-CM | POA: Diagnosis not present

## 2021-06-17 DIAGNOSIS — I25721 Atherosclerosis of autologous artery coronary artery bypass graft(s) with angina pectoris with documented spasm: Secondary | ICD-10-CM | POA: Diagnosis not present

## 2021-06-17 DIAGNOSIS — E7800 Pure hypercholesterolemia, unspecified: Secondary | ICD-10-CM | POA: Diagnosis not present

## 2021-06-17 DIAGNOSIS — I739 Peripheral vascular disease, unspecified: Secondary | ICD-10-CM | POA: Diagnosis not present

## 2021-06-17 DIAGNOSIS — Z720 Tobacco use: Secondary | ICD-10-CM | POA: Diagnosis not present

## 2021-06-20 ENCOUNTER — Other Ambulatory Visit: Payer: Self-pay | Admitting: Nurse Practitioner

## 2021-06-20 DIAGNOSIS — E1142 Type 2 diabetes mellitus with diabetic polyneuropathy: Secondary | ICD-10-CM

## 2021-06-24 DIAGNOSIS — Z20822 Contact with and (suspected) exposure to covid-19: Secondary | ICD-10-CM | POA: Diagnosis not present

## 2021-06-25 ENCOUNTER — Other Ambulatory Visit (INDEPENDENT_AMBULATORY_CARE_PROVIDER_SITE_OTHER): Payer: Self-pay | Admitting: Nurse Practitioner

## 2021-06-25 ENCOUNTER — Ambulatory Visit: Payer: Medicare Other | Admitting: Nurse Practitioner

## 2021-06-25 ENCOUNTER — Other Ambulatory Visit (INDEPENDENT_AMBULATORY_CARE_PROVIDER_SITE_OTHER): Payer: Self-pay

## 2021-06-25 DIAGNOSIS — Z9889 Other specified postprocedural states: Secondary | ICD-10-CM

## 2021-06-27 ENCOUNTER — Ambulatory Visit (INDEPENDENT_AMBULATORY_CARE_PROVIDER_SITE_OTHER): Payer: Medicare Other | Admitting: Nurse Practitioner

## 2021-06-27 ENCOUNTER — Encounter (INDEPENDENT_AMBULATORY_CARE_PROVIDER_SITE_OTHER): Payer: Medicare Other

## 2021-06-27 ENCOUNTER — Encounter (INDEPENDENT_AMBULATORY_CARE_PROVIDER_SITE_OTHER): Payer: Self-pay

## 2021-07-03 ENCOUNTER — Other Ambulatory Visit: Payer: Self-pay | Admitting: Nurse Practitioner

## 2021-07-03 DIAGNOSIS — M7540 Impingement syndrome of unspecified shoulder: Secondary | ICD-10-CM

## 2021-07-10 ENCOUNTER — Other Ambulatory Visit: Payer: Self-pay | Admitting: Physical Medicine and Rehabilitation

## 2021-07-10 ENCOUNTER — Encounter: Payer: Self-pay | Admitting: Physical Medicine and Rehabilitation

## 2021-07-10 ENCOUNTER — Encounter (INDEPENDENT_AMBULATORY_CARE_PROVIDER_SITE_OTHER): Payer: Self-pay

## 2021-07-10 MED ORDER — NITROGLYCERIN 0.2 MG/HR TD PT24: 0.2000 mg | MEDICATED_PATCH | Freq: Every day | TRANSDERMAL | 11 refills | Status: DC

## 2021-07-17 ENCOUNTER — Telehealth: Payer: Self-pay

## 2021-07-17 ENCOUNTER — Ambulatory Visit: Payer: Medicare Other | Admitting: Nurse Practitioner

## 2021-07-17 NOTE — Telephone Encounter (Signed)
Pt's sister called regarding pt. Pt had a rough night, upset stomach. Pt has been feeling light headed and falling frequently. It was about 3 times a week for falling but yesterday it was 3 times. Pt said it feels like how she did after heart surgery in December. She saw the cardiologist about 2 weeks ago. Pt also needs help with house keeping, cooking and maybe showering. Pt is requesting a home health care nurse. Feet hurt so bad, cant stand for long periods of time, low energy level. Advised pt needs appt, transferred to [facility] to schedule.

## 2021-07-17 NOTE — Telephone Encounter (Signed)
Pt's sister called earlier today and made appt for pt due to  Pt's sister called regarding pt. Pt had a rough night, upset stomach. Pt has been feeling light headed and falling frequently. It was about 3 times a week for falling but yesterday it was 3 times. Pt said it feels like how she did after heart surgery in December. She saw the cardiologist about 2 weeks ago. Pt also needs help with house keeping, cooking and maybe showering. Pt is requesting a home health care nurse. Feet hurt so bad, cant stand for long periods of time, low energy level. Advised pt needs appt, transferred to [facility] to schedule.  Then sister called back LMOM during lunch time and she advised that pt was feeling very bad and said she wasn't coming for the appt and sister asked to cancel appt.  I tried to call pt back and got VM and I LMOM that pt should go to the ER if feeling that bad.  I also called Pt's husband Kimberlin Scheel and he advised he was going home from work and see what he could do to get pt to go to hospital.

## 2021-07-23 ENCOUNTER — Telehealth: Payer: Self-pay

## 2021-07-23 NOTE — Telephone Encounter (Signed)
Pt LMOM she needed a refill on prozac and I tried to call patient back to make sure what dose she is taking no answer I LMOM for pt to return call.

## 2021-07-25 ENCOUNTER — Other Ambulatory Visit: Payer: Self-pay

## 2021-07-25 MED ORDER — FLUOXETINE HCL 40 MG PO CAPS: 80.0000 mg | ORAL_CAPSULE | Freq: Every day | ORAL | 0 refills | Status: DC

## 2021-07-27 ENCOUNTER — Other Ambulatory Visit: Payer: Self-pay | Admitting: Nurse Practitioner

## 2021-07-27 DIAGNOSIS — E781 Pure hyperglyceridemia: Secondary | ICD-10-CM

## 2021-08-20 ENCOUNTER — Other Ambulatory Visit: Payer: Self-pay

## 2021-08-20 ENCOUNTER — Emergency Department: Payer: Medicare Other

## 2021-08-20 ENCOUNTER — Emergency Department
Admission: EM | Admit: 2021-08-20 | Discharge: 2021-08-20 | Disposition: A | Discharge status: Home / Self Care | Payer: Medicare Other | Attending: Emergency Medicine | Admitting: Emergency Medicine

## 2021-08-20 DIAGNOSIS — I2510 Atherosclerotic heart disease of native coronary artery without angina pectoris: Secondary | ICD-10-CM | POA: Diagnosis not present

## 2021-08-20 DIAGNOSIS — E1165 Type 2 diabetes mellitus with hyperglycemia: Secondary | ICD-10-CM | POA: Insufficient documentation

## 2021-08-20 DIAGNOSIS — Z951 Presence of aortocoronary bypass graft: Secondary | ICD-10-CM | POA: Diagnosis not present

## 2021-08-20 DIAGNOSIS — R739 Hyperglycemia, unspecified: Secondary | ICD-10-CM

## 2021-08-20 DIAGNOSIS — I10 Essential (primary) hypertension: Secondary | ICD-10-CM | POA: Diagnosis not present

## 2021-08-20 DIAGNOSIS — R0789 Other chest pain: Secondary | ICD-10-CM | POA: Diagnosis not present

## 2021-08-20 DIAGNOSIS — R079 Chest pain, unspecified: Secondary | ICD-10-CM | POA: Diagnosis not present

## 2021-08-20 LAB — URINALYSIS, ROUTINE W REFLEX MICROSCOPIC
Bilirubin Urine: NEGATIVE
Glucose, UA: >=500 mg/dL — AB
Hgb urine dipstick: NEGATIVE
Ketones, ur: NEGATIVE mg/dL
Nitrite: NEGATIVE
Protein, ur: >=300 mg/dL — AB
Specific Gravity, Urine: 1.007 (ref 1.005–1.030)
WBC, UA: >50 WBC/hpf — ABNORMAL HIGH (ref 0–5)
pH: 5 (ref 5.0–8.0)

## 2021-08-20 LAB — CBC
HCT: 34.1 % — ABNORMAL LOW (ref 36.0–46.0)
Hemoglobin: 11.2 g/dL — ABNORMAL LOW (ref 12.0–15.0)
MCH: 25.9 pg — ABNORMAL LOW (ref 26.0–34.0)
MCHC: 32.8 g/dL (ref 30.0–36.0)
MCV: 78.9 fL — ABNORMAL LOW (ref 80.0–100.0)
Platelets: 349 10*3/uL (ref 150–400)
RBC: 4.32 MIL/uL (ref 3.87–5.11)
RDW: 14.5 % (ref 11.5–15.5)
WBC: 6.6 10*3/uL (ref 4.0–10.5)
nRBC: 0 % (ref 0.0–0.2)

## 2021-08-20 LAB — BASIC METABOLIC PANEL
Anion gap: 11 (ref 5–15)
BUN: 20 mg/dL (ref 6–20)
CO2: 24 mmol/L (ref 22–32)
Calcium: 8.7 mg/dL — ABNORMAL LOW (ref 8.9–10.3)
Chloride: 98 mmol/L (ref 98–111)
Creatinine, Ser: 1.05 mg/dL — ABNORMAL HIGH (ref 0.44–1.00)
GFR, Estimated: >60 mL/min (ref >60)
Glucose, Bld: 396 mg/dL — ABNORMAL HIGH (ref 70–99)
Potassium: 3.4 mmol/L — ABNORMAL LOW (ref 3.5–5.1)
Sodium: 133 mmol/L — ABNORMAL LOW (ref 135–145)

## 2021-08-20 LAB — CBG MONITORING, ED: Glucose-Capillary: 133 mg/dL — ABNORMAL HIGH (ref 70–99)

## 2021-08-20 LAB — TROPONIN I (HIGH SENSITIVITY)
Troponin I (High Sensitivity): 10 ng/L (ref <18)
Troponin I (High Sensitivity): 8 ng/L (ref <18)

## 2021-08-20 MED ORDER — CEPHALEXIN 500 MG PO CAPS: 500.0000 mg | ORAL_CAPSULE | Freq: Four times a day (QID) | ORAL | 0 refills | Status: AC

## 2021-08-20 MED ORDER — SODIUM CHLORIDE 0.9 % IV BOLUS
1000.0000 mL | Freq: Once | INTRAVENOUS | Status: AC
  Administered 2021-08-20: 1000 mL via INTRAVENOUS

## 2021-08-20 NOTE — ED Triage Notes (Signed)
Pt to ED for left chest pain radiating to left arm that started this am. Denies n.v. denies shob.  Pt states has been very off balance this week, weakness in bilateral legs.

## 2021-08-20 NOTE — ED Notes (Signed)
First Nurse Note: Pt to ED via POV for chest pain and left arm pain. Pt is in NAD.

## 2021-08-20 NOTE — ED Notes (Signed)
Provider aware of pt BP.

## 2021-08-20 NOTE — ED Provider Notes (Signed)
[facility] Provider Note    Event Date/Time   First MD Initiated Contact with Patient 08/20/21 1234     (approximate)   History   Chief Complaint Chest Pain   HPI  Morgan Daniels is a 61 y.o. female with past medical history of hypertension, hyperlipidemia, diabetes, and CAD status post CABG who presents to the ED complaining of chest pain.  Patient reports that she has been dealing with intermittent sharp pain in the left side of her chest radiating down her left arm since this morning.  Pain seems to come on for about 10 minutes at a time before resolving on its own.  It has not been brought on by anything specific but she does state that it radiates down her left arm where she has some tingling.  She denies any fevers, cough, or shortness of breath.  She has not noticed any pain or swelling in her legs.  She does not have any pain in her chest currently and denies any weakness in her arm.  She does admit that she has not been consistent taking her medications for diabetes.     Physical Exam   Triage Vital Signs: ED Triage Vitals [08/20/21 1030]  Enc Vitals Group     BP 131/69     Pulse Rate 67     Resp 16     Temp 98 F (36.7 C)     Temp src      SpO2 98 %     Weight 203 lb (92.1 kg)     Height '5\' 7"'$  (1.702 m)     Head Circumference      Peak Flow      Pain Score 3     Pain Loc      Pain Edu?      Excl. in [place]?     Most recent vital signs: Vitals:   08/20/21 1430 08/20/21 1445  BP: (!) 201/96   Pulse: 64 65  Resp: 17 14  Temp:    SpO2: 94% 92%    Constitutional: Alert and oriented. Eyes: Conjunctivae are normal. Head: Atraumatic. Nose: No congestion/rhinnorhea. Mouth/Throat: Mucous membranes are moist.  Cardiovascular: Normal rate, regular rhythm. Grossly normal heart sounds.  2+ radial pulses bilaterally. Respiratory: Normal respiratory effort.  No retractions. Lungs CTAB.  No chest wall tenderness to  palpation. Gastrointestinal: Soft and nontender. No distention. Musculoskeletal: No lower extremity tenderness nor edema.  Neurologic:  Normal speech and language. No gross focal neurologic deficits are appreciated.    ED Results / Procedures / Treatments   Labs (all labs ordered are listed, but only abnormal results are displayed) Labs Reviewed  BASIC METABOLIC PANEL - Abnormal; Notable for the following components:      Result Value   Sodium 133 (*)    Potassium 3.4 (*)    Glucose, Bld 396 (*)    Creatinine, Ser 1.05 (*)    Calcium 8.7 (*)    All other components within normal limits  CBC - Abnormal; Notable for the following components:   Hemoglobin 11.2 (*)    HCT 34.1 (*)    MCV 78.9 (*)    MCH 25.9 (*)    All other components within normal limits  CBG MONITORING, ED - Abnormal; Notable for the following components:   Glucose-Capillary 133 (*)    All other components within normal limits  URINALYSIS, ROUTINE W REFLEX MICROSCOPIC  TROPONIN I (HIGH SENSITIVITY)  TROPONIN I (HIGH SENSITIVITY)  EKG  ED ECG REPORT I, Blake Divine, the attending physician, personally viewed and interpreted this ECG.   Date: 08/20/2021  EKG Time: 10:33  Rate: 70  Rhythm: normal sinus rhythm  Axis: LAD  Intervals:none  ST&T Change: LVH  RADIOLOGY Chest x-ray reviewed and interpreted by me with no infiltrate, edema, or effusion.  PROCEDURES:  Critical Care performed: No  Procedures   MEDICATIONS ORDERED IN ED: Medications  sodium chloride 0.9 % bolus 1,000 mL (0 mLs Intravenous Stopped 08/20/21 1426)     IMPRESSION / MDM / ASSESSMENT AND PLAN / ED COURSE  I reviewed the triage vital signs and the nursing notes.                              61 y.o. female with past medical history of hypertension, hyperlipidemia, diabetes, and CAD status post CABG who presents to the ED complaining of intermittent sharp pain in the left side of her chest radiating down her left arm  since this morning.  Patient's presentation is most consistent with acute presentation with potential threat to life or bodily function.  Differential diagnosis includes, but is not limited to, ACS, PE, pneumonia, dissection, pneumothorax, musculoskeletal pain, GERD, radiculopathy, and anxiety.  Patient well-appearing and in no acute distress, vital signs remarkable for high blood pressure but otherwise reassuring.  EKG shows no evidence of arrhythmia or ischemia and symptoms are atypical for ACS.  She is neurovascularly intact to her left upper extremity and currently states that she is not having any chest pain.  2 sets of troponin are negative and I doubt ACS at this time given her atypical symptoms with no ongoing pain.  I also doubt PE or dissection given resolution of pain with reassuring vital signs.  Chest x-ray is unremarkable and remainder of labs significant only for hyperglycemia with no evidence of DKA or other electrolyte abnormality.  CBC shows no significant anemia or leukocytosis.  Patient was given IV fluid bolus with improvement in blood glucose, remains asymptomatic in terms of chest pain at this time.  She is appropriate for discharge home with cardiology follow-up, was counseled to return to the ED for new or worsening symptoms.  Patient agrees with plan.      FINAL CLINICAL IMPRESSION(S) / ED DIAGNOSES   Final diagnoses:  Atypical chest pain  Hyperglycemia     Rx / DC Orders   ED Discharge Orders          Ordered    Ambulatory referral to Cardiology        08/20/21 1444             Note:  This document was prepared using Dragon voice recognition software and may include unintentional dictation errors.   Blake Divine, MD 08/20/21 [phone]

## 2021-08-20 NOTE — ED Notes (Signed)
Pt called and left message about picking up her prescription.

## 2021-08-21 ENCOUNTER — Ambulatory Visit (INDEPENDENT_AMBULATORY_CARE_PROVIDER_SITE_OTHER): Payer: Medicare Other | Admitting: Nurse Practitioner

## 2021-08-21 ENCOUNTER — Encounter: Payer: Self-pay | Admitting: Nurse Practitioner

## 2021-08-21 VITALS — BP 170/100 | HR 74 | Temp 97.6°F | Resp 16 | Ht 67.0 in | Wt 198.0 lb

## 2021-08-21 DIAGNOSIS — M79671 Pain in right foot: Secondary | ICD-10-CM | POA: Diagnosis not present

## 2021-08-21 DIAGNOSIS — E1140 Type 2 diabetes mellitus with diabetic neuropathy, unspecified: Secondary | ICD-10-CM

## 2021-08-21 DIAGNOSIS — I70222 Atherosclerosis of native arteries of extremities with rest pain, left leg: Secondary | ICD-10-CM | POA: Diagnosis not present

## 2021-08-21 DIAGNOSIS — I10 Essential (primary) hypertension: Secondary | ICD-10-CM | POA: Diagnosis not present

## 2021-08-21 DIAGNOSIS — M79672 Pain in left foot: Secondary | ICD-10-CM | POA: Diagnosis not present

## 2021-08-21 DIAGNOSIS — Z794 Long term (current) use of insulin: Secondary | ICD-10-CM | POA: Diagnosis not present

## 2021-08-21 MED ORDER — TRAMADOL HCL 50 MG PO TABS: 50.0000 mg | ORAL_TABLET | Freq: Three times a day (TID) | ORAL | 2 refills | Status: DC | PRN

## 2021-08-21 MED ORDER — GEMFIBROZIL 600 MG PO TABS: 600.0000 mg | ORAL_TABLET | Freq: Two times a day (BID) | ORAL | 3 refills | Status: DC

## 2021-08-21 MED ORDER — METOPROLOL SUCCINATE ER 50 MG PO TB24
50.0000 mg | ORAL_TABLET | Freq: Every day | ORAL | 3 refills | Status: DC
Start: 2021-08-21 — End: 2022-08-19

## 2021-08-21 MED ORDER — ATORVASTATIN CALCIUM 80 MG PO TABS: 80.0000 mg | ORAL_TABLET | Freq: Every day | ORAL | 1 refills | Status: DC

## 2021-08-21 MED ORDER — AMLODIPINE BESYLATE 10 MG PO TABS: 10.0000 mg | ORAL_TABLET | Freq: Every day | ORAL | 1 refills | Status: DC

## 2021-08-21 MED ORDER — DAPAGLIFLOZIN PROPANEDIOL 10 MG PO TABS: 10.0000 mg | ORAL_TABLET | Freq: Every day | ORAL | 3 refills | Status: DC

## 2021-08-21 MED ORDER — GABAPENTIN 600 MG PO TABS: 600.0000 mg | ORAL_TABLET | Freq: Four times a day (QID) | ORAL | 3 refills | Status: DC

## 2021-08-21 NOTE — Progress Notes (Addendum)
[facility] [place], [place] [address]  Internal MEDICINE  Office Visit Note  Patient Name: Morgan Daniels  [id number]  [id number]  Date of Service: 08/21/2021  Chief Complaint  Patient presents with   Follow-up   Depression   Diabetes   Gastroesophageal Reflux   Hyperlipidemia   Hypertension    HPI Corissa presents for follow-up visit for hypertension, diabetes, hyperlipidemia, and depression.  She went to the emergency room yesterday for chest pain and high blood sugar.  Her blood pressure remains significantly elevated even when rechecked, see vitals.  Her glucose was close to 400 in the ER yesterday she also is slightly low potassium, chronically low calcium and slightly low sodium level.  Her creatinine is slightly elevated and there is a slight decrease in her GFR she has chronic anemia but is improved when compared to prior labs.  Her urine shows a possible UTI which she states is the same one that she was previously treated for that never resolved completely Patient reports that she is feeling like her anxiety and depressive symptoms are not fully controlled and she is currently on fluoxetine 40 mg twice daily.  She has been on this medication for a long time and has not tried anything else recently.  In the past she has tried duloxetine, citalopram, sertraline, paroxetine.  She is interested in trying something different. -- Patient has been seeing endocrinology in [facility].  The patient and her husband who is present at her visit today both agree that this is too far of a drive for her and would like to find an endocrinologist that is closer to home in [place].  See was in November last year and it was 8.8.     Current Medication: Outpatient Encounter Medications as of 08/21/2021  Medication Sig   aspirin 81 MG chewable tablet Chew 1 tablet (81 mg total) by mouth daily.   cephALEXin (KEFLEX) 500 MG capsule Take 1 capsule (500 mg total)  by mouth 4 (four) times daily for 5 days.   Cholecalciferol (VITAMIN D-3) 125 MCG (5000 UT) TABS Take 5,000 Units by mouth daily.   clopidogrel (PLAVIX) 75 MG tablet Take 1 tablet (75 mg total) by mouth daily.   Continuous Blood Gluc Receiver (FREESTYLE LIBRE 2 READER) DEVI As directed   Continuous Blood Gluc Sensor (FREESTYLE LIBRE 2 SENSOR) MISC USE TO TEST BLOOD GLUCOSE 4 TIMES A DAY . DX E11.65   dapagliflozin propanediol (FARXIGA) 10 MG TABS tablet Take 1 tablet (10 mg total) by mouth daily before breakfast.   dicyclomine (BENTYL) 10 MG capsule TAKE 1 CAPSULE (10 MG TOTAL) BY MOUTH 3 (THREE) TIMES DAILY BEFORE MEALS.   FLUoxetine (PROZAC) 40 MG capsule Take 2 capsules (80 mg total) by mouth daily.   insulin aspart (NOVOLOG FLEXPEN) 100 UNIT/ML FlexPen Inject 20 Units into the skin 3 (three) times daily before meals. Needs appointment for further refills.   Multiple Vitamin (MULTIVITAMIN WITH MINERALS) TABS tablet Take 1 tablet by mouth daily.   mupirocin ointment (BACTROBAN) 2 % Apply 1 application. topically 2 (two) times daily.   nitroGLYCERIN (NITRODUR - DOSED IN MG/24 HR) 0.2 mg/hr patch Place 1 patch (0.2 mg total) onto the skin daily.   saccharomyces boulardii (FLORASTOR) 250 MG capsule Take 1 capsule (250 mg total) by mouth 2 (two) times daily.   tiZANidine (ZANAFLEX) 4 MG tablet TAKE 1 TABLET(S) BY MOUTH TWICE A DAY AS NEEDED FOR MUSCLE PAIN AND SPASMS  TRESIBA FLEXTOUCH 200 UNIT/ML FlexTouch Pen INJECT 100 UNITS INTO THE SKIN AT BEDTIME.   ULTICARE MICRO PEN NEEDLES 32G X 4 MM MISC TO USE WITH INSULIN DOSING THREE TIMES DAILY PRIOR TO MEALS AND AT BEDTIME FOR BASAL INSULIN.   umeclidinium-vilanterol (ANORO ELLIPTA) 62.5-25 MCG/ACT AEPB Inhale 1 puff into the lungs daily.   [DISCONTINUED] amiodarone (PACERONE) 200 MG tablet Take 1 tablet (200 mg total) by mouth daily.   [DISCONTINUED] amLODipine (NORVASC) 10 MG tablet TAKE 1 TABLET BY MOUTH EVERY DAY   [DISCONTINUED] atorvastatin  (LIPITOR) 80 MG tablet TAKE 1 TABLET BY MOUTH EVERY DAY   [DISCONTINUED] gabapentin (NEURONTIN) 600 MG tablet TAKE 1 TABLET BY MOUTH THREE TIMES A DAY   [DISCONTINUED] metoprolol tartrate (LOPRESSOR) 25 MG tablet Take 1 tablet (25 mg total) by mouth 2 (two) times daily.   [DISCONTINUED] pantoprazole sodium (PROTONIX) 40 mg Take 40 mg by mouth daily.   [DISCONTINUED] traMADol (ULTRAM) 50 MG tablet Take 1 tablet (50 mg total) by mouth 3 (three) times daily as needed for moderate pain or severe pain. Take one tab po bid prn for pain   amLODipine (NORVASC) 10 MG tablet Take 1 tablet (10 mg total) by mouth daily.   atorvastatin (LIPITOR) 80 MG tablet Take 1 tablet (80 mg total) by mouth daily.   gabapentin (NEURONTIN) 600 MG tablet Take 1 tablet (600 mg total) by mouth in the morning, at noon, in the evening, and at bedtime.   gemfibrozil (LOPID) 600 MG tablet Take 1 tablet (600 mg total) by mouth 2 (two) times daily.   metoprolol succinate (TOPROL-XL) 50 MG 24 hr tablet Take 1 tablet (50 mg total) by mouth daily.   traMADol (ULTRAM) 50 MG tablet Take 1 tablet (50 mg total) by mouth 3 (three) times daily as needed for moderate pain or severe pain. Take one tab po bid prn for pain   [DISCONTINUED] gemfibrozil (LOPID) 600 MG tablet Take 600 mg by mouth 2 (two) times daily.   [DISCONTINUED] metoprolol succinate (TOPROL-XL) 50 MG 24 hr tablet Take 50 mg by mouth daily.   No facility-administered encounter medications on file as of 08/21/2021.    Surgical History: Past Surgical History:  Procedure Laterality Date   ABDOMINAL HYSTERECTOMY  1990   BACK SURGERY  2000, 2004   CAROTID ENDARTERECTOMY Left 05/06/12   CARPAL TUNNEL RELEASE Left 07/18/2015   Procedure: CARPAL TUNNEL RELEASE;  Surgeon: Earnestine Leys, MD;  Location: [facility] ORS;  Service: Orthopedics;  Laterality: Left;   CEREBRAL ANGIOGRAM Bilateral 05/03/2012   Procedure: CEREBRAL ANGIOGRAM;  Surgeon: Angelia Mould, MD;  Location: [facility] CATH  LAB;  Service: Cardiovascular;  Laterality: Bilateral;   CHOLECYSTECTOMY  2001   COLONOSCOPY WITH PROPOFOL N/A 11/19/2018   Procedure: COLONOSCOPY WITH PROPOFOL;  Surgeon: Lucilla Lame, MD;  Location: [place];  Service: Endoscopy;  Laterality: N/A;  Diabetic - insulin   CORONARY ARTERY BYPASS GRAFT N/A 01/18/2021   Procedure: CORONARY ARTERY BYPASS GRAFTING (CABG) x 4  USING LEFT INTERNAL MAMMARY ARTERY AND LEFT ENDOSCOPIC GREATER SAPHENOUS VEIN CONDUITS;  Surgeon: [name], MD;  Location: [place];  Service: Open Heart Surgery;  Laterality: N/A;   CORONARY/GRAFT ACUTE MI REVASCULARIZATION N/A 01/13/2021   Procedure: Coronary/Graft Acute MI Revascularization;  Surgeon: Isaias Cowman, MD;  Location: [place] CV LAB;  Service: Cardiovascular;  Laterality: N/A;   ENDARTERECTOMY Left 05/06/2012   Procedure: ENDARTERECTOMY CAROTID;  Surgeon: Angelia Mould, MD;  Location: [place];  Service: Vascular;  Laterality: Left;   ENDARTERECTOMY Right 08/09/2013   Procedure: ENDARTERECTOMY CAROTID-RIGHT;  Surgeon: Angelia Mould, MD;  Location: [place];  Service: Vascular;  Laterality: Right;   ENDOVEIN HARVEST OF GREATER SAPHENOUS VEIN Left 01/18/2021   Procedure: ENDOVEIN HARVEST OF GREATER SAPHENOUS VEIN;  Surgeon: [name], MD;  Location: [place];  Service: Open Heart Surgery;  Laterality: Left;   HERNIA REPAIR     IABP INSERTION Right 01/13/2021   Procedure: IABP Insertion;  Surgeon: Isaias Cowman, MD;  Location: [place] CV LAB;  Service: Cardiovascular;  Laterality: Right;   LEFT HEART CATH AND CORONARY ANGIOGRAPHY N/A 01/13/2021   Procedure: LEFT HEART CATH AND CORONARY ANGIOGRAPHY;  Surgeon: Isaias Cowman, MD;  Location: [place] CV LAB;  Service: Cardiovascular;  Laterality: N/A;   LOWER EXTREMITY ANGIOGRAPHY Left 01/03/2021   Procedure: LOWER EXTREMITY ANGIOGRAPHY;  Surgeon: Algernon Huxley, MD;  Location: [place] CV LAB;   Service: Cardiovascular;  Laterality: Left;   LOWER EXTREMITY ANGIOGRAPHY Left 02/28/2021   Procedure: LOWER EXTREMITY ANGIOGRAPHY;  Surgeon: Algernon Huxley, MD;  Location: [place] CV LAB;  Service: Cardiovascular;  Laterality: Left;   PATCH ANGIOPLASTY Left 05/06/2012   Procedure: WITH DACRON PATCH ANGIOPLASTY ;  Surgeon: Angelia Mould, MD;  Location: [place];  Service: Vascular;  Laterality: Left;   POLYPECTOMY  11/19/2018   Procedure: POLYPECTOMY;  Surgeon: Lucilla Lame, MD;  Location: [place];  Service: Endoscopy;;   SPINE SURGERY  2004   TEE WITHOUT CARDIOVERSION N/A 01/18/2021   Procedure: TRANSESOPHAGEAL ECHOCARDIOGRAM (TEE);  Surgeon: [name], MD;  Location: [place];  Service: Open Heart Surgery;  Laterality: N/A;   TONSILLECTOMY     TUBAL LIGATION      Medical History: Past Medical History:  Diagnosis Date   Anxiety    Arthritis    Bilateral carotid artery stenosis 2014   Carotid artery occlusion    Chronic kidney disease 06/18/2015   UTI   Chronic kidney disease 2017   Current smoker    CVA (cerebral vascular accident) ([place]) 2013   Depression    Diabetes ([place])    Diverticulosis    Fatty liver    Fibromyalgia    GERD (gastroesophageal reflux disease)    H/O hiatal hernia    Heart attack ([place]) 01/12/2021   Hiatal hernia    Hypercholesteremia    Hypertension    IBS (irritable bowel syndrome)    PAD (peripheral artery disease) (H[facility])    Peptic ulcer    Plantar fasciitis    Stroke ([place]) 01/31/2012   Right side-ministroke   T2DM (type 2 diabetes mellitus) ([place])     Family History: Family History  Problem Relation Age of Onset   Hypertension Mother    Hyperlipidemia Mother    Deep vein thrombosis Mother    Cancer Father    Alcohol abuse Father    Heart failure Father    Hypertension Maternal Grandmother    Deep vein thrombosis Sister    Alcohol abuse Sister    Alcohol abuse Sister     Social History   Socioeconomic History    Marital status: Married    Spouse name: Not on file   Number of children: Not on file   Years of education: Not on file   Highest education level: Not on file  Occupational History   Not on file  Tobacco Use   Smoking status: Former    Packs/day: 1.00    Years: 40.00  Total pack years: 40.00    Types: Cigarettes    Start date: 11/09/1970    Passive exposure: Current   Smokeless tobacco: Never   Tobacco comments:       Vaping Use   Vaping Use: Former  Substance and Sexual Activity   Alcohol use: Not Currently    Comment: rarely   Drug use: No   Sexual activity: Not Currently    Partners: Male  Other Topics Concern   Not on file  Social History Narrative   ** Merged History Encounter **       Social Determinants of Health   Financial Resource Strain: Not on file  Food Insecurity: Not on file  Transportation Needs: Not on file  Physical Activity: Not on file  Stress: Not on file  Social Connections: Not on file  Intimate Partner Violence: Not on file      Review of Systems  Constitutional:  Positive for fatigue. Negative for chills and unexpected weight change.  HENT:  Negative for congestion, rhinorrhea, sneezing and sore throat.   Eyes:  Negative for redness.  Respiratory:  Positive for shortness of breath. Negative for cough, chest tightness and wheezing.   Cardiovascular: Negative.  Negative for chest pain and palpitations.  Gastrointestinal:  Negative for abdominal pain, constipation, diarrhea, nausea and vomiting.  Genitourinary:  Negative for dysuria and frequency.  Musculoskeletal:  Positive for arthralgias and back pain. Negative for joint swelling and neck pain.  Skin:  Negative for rash.  Neurological: Negative.  Negative for tremors and numbness.  Hematological:  Negative for adenopathy. Does not bruise/bleed easily.  Psychiatric/Behavioral:  Negative for behavioral problems (Depression), sleep disturbance and suicidal ideas. The patient is not  nervous/anxious.     Vital Signs: BP (!) 170/100 Comment: 182/92  Pulse 74   Temp 97.6 F (36.4 C)   Resp 16   Ht '5\' 7"'$  (1.702 m)   Wt 198 lb (89.8 kg)   SpO2 95%   BMI 31.01 kg/m    Physical Exam Vitals reviewed.  Constitutional:      General: She is not in acute distress.    Appearance: Normal appearance. She is obese. She is not ill-appearing.  HENT:     Head: Normocephalic and atraumatic.  Eyes:     Pupils: Pupils are equal, round, and reactive to light.  Cardiovascular:     Rate and Rhythm: Normal rate and regular rhythm.  Pulmonary:     Effort: Pulmonary effort is normal. No respiratory distress.  Neurological:     Mental Status: She is alert and oriented to person, place, and time.  Psychiatric:        Mood and Affect: Mood normal.        Behavior: Behavior normal.        Assessment/Plan: 1. Type 2 diabetes mellitus with diabetic neuropathy, with long-term current use of insulin ([place]) Refills ordered, patient needs a new endocrinologist due to not being able to drive back for all the way to [place] from [facility] to see her specialist.  She needs an endocrinologist that is closer to home, referral ordered.  Her last A1c was 8.8 in November 2022. - gabapentin (NEURONTIN) 600 MG tablet; Take 1 tablet (600 mg total) by mouth in the morning, at noon, in the evening, and at bedtime.  Dispense: 360 tablet; Refill: 3 - Ambulatory referral to Endocrinology - dapagliflozin propanediol (FARXIGA) 10 MG TABS tablet; Take 1 tablet (10 mg total) by mouth daily before breakfast.  Dispense: 30  tablet; Refill: 3  2. Essential hypertension Blood pressure continues to be elevated, patient is having pain, controlling her pain will help improve her BP, her medications are also managed and followed by cardiology. May need to increase her metoprolol or add an ACE or ARB.  - amLODipine (NORVASC) 10 MG tablet; Take 1 tablet (10 mg total) by mouth daily.  Dispense: 90 tablet;  Refill: 1 - metoprolol succinate (TOPROL-XL) 50 MG 24 hr tablet; Take 1 tablet (50 mg total) by mouth daily.  Dispense: 90 tablet; Refill: 3  3. 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  - gemfibrozil (LOPID) 600 MG tablet; Take 1 tablet (600 mg total) by mouth 2 (two) times daily.  Dispense: 180 tablet; Refill: 3 - atorvastatin (LIPITOR) 80 MG tablet; Take 1 tablet (80 mg total) by mouth daily.  Dispense: 90 tablet; Refill: 1  4. Inflammatory pain of left heel Tramadol prescribed for pain - traMADol (ULTRAM) 50 MG tablet; Take 1 tablet (50 mg total) by mouth 3 (three) times daily as needed for moderate pain or severe pain.  Dispense: 90 tablet; Refill: 2  5. Inflammatory pain of right heel Tramadol prescribed for pain - traMADol (ULTRAM) 50 MG tablet; Take 1 tablet (50 mg total) by mouth 3 (three) times daily as needed for moderate pain or severe pain.  Dispense: 90 tablet; Refill: 2   General Counseling: Kendyll verbalizes understanding of the findings of todays visit and agrees with plan of treatment. I have discussed any further diagnostic evaluation that may be needed or ordered today. We also reviewed her medications today. she has been encouraged to call the office with any questions or concerns that should arise related to todays visit.    Orders Placed This Encounter  Procedures   Ambulatory referral to Endocrinology    Meds ordered this encounter  Medications   gemfibrozil (LOPID) 600 MG tablet    Sig: Take 1 tablet (600 mg total) by mouth 2 (two) times daily.    Dispense:  180 tablet    Refill:  3   gabapentin (NEURONTIN) 600 MG tablet    Sig: Take 1 tablet (600 mg total) by mouth in the morning, at noon, in the evening, and at bedtime.    Dispense:  360 tablet    Refill:  3   amLODipine (NORVASC) 10 MG tablet    Sig: Take 1 tablet (10 mg total) by mouth daily.    Dispense:  90  tablet    Refill:  1   traMADol (ULTRAM) 50 MG tablet    Sig: Take 1 tablet (50 mg total) by mouth 3 (three) times daily as needed for moderate pain or severe pain. Take one tab po bid prn for pain    Dispense:  90 tablet    Refill:  2   metoprolol succinate (TOPROL-XL) 50 MG 24 hr tablet    Sig: Take 1 tablet (50 mg total) by mouth daily.    Dispense:  90 tablet    Refill:  3   atorvastatin (LIPITOR) 80 MG tablet    Sig: Take 1 tablet (80 mg total) by mouth daily.    Dispense:  90 tablet    Refill:  1   dapagliflozin propanediol (FARXIGA) 10 MG TABS tablet    Sig: Take 1 tablet (10 mg total) by mouth daily before breakfast.    Dispense:  30 tablet    Refill:  3  Return in about 4 weeks (around 09/18/2021) for F/U, eval new med, [place] PCP.   Total time spent:30 Minutes Time spent includes review of chart, medications, test results, and follow up plan with the patient.   [place] Controlled Substance Database was reviewed by me.  This patient was seen by Jonetta Osgood, FNP-C in collaboration with Dr. Clayborn Bigness as a part of collaborative care agreement.   Destiney Sanabia R. Valetta Fuller, MSN, FNP-C Internal medicine

## 2021-08-22 ENCOUNTER — Telehealth: Payer: Self-pay

## 2021-08-22 NOTE — Telephone Encounter (Signed)
Awaiting 07/05//23 office notes for urgent endocrinology referral-Morgan Daniels

## 2021-08-26 MED ORDER — TRAMADOL HCL 50 MG PO TABS: 50.0000 mg | ORAL_TABLET | Freq: Three times a day (TID) | ORAL | 2 refills | Status: DC | PRN

## 2021-08-27 MED ORDER — TRAMADOL HCL 50 MG PO TABS: 50.0000 mg | ORAL_TABLET | Freq: Three times a day (TID) | ORAL | 2 refills | Status: DC | PRN

## 2021-08-28 ENCOUNTER — Other Ambulatory Visit: Payer: Self-pay | Admitting: Nurse Practitioner

## 2021-08-28 DIAGNOSIS — E1142 Type 2 diabetes mellitus with diabetic polyneuropathy: Secondary | ICD-10-CM

## 2021-08-31 ENCOUNTER — Other Ambulatory Visit: Payer: Self-pay | Admitting: Nurse Practitioner

## 2021-08-31 ENCOUNTER — Encounter: Payer: Self-pay | Admitting: Nurse Practitioner

## 2021-08-31 DIAGNOSIS — E1142 Type 2 diabetes mellitus with diabetic polyneuropathy: Secondary | ICD-10-CM

## 2021-09-04 ENCOUNTER — Telehealth: Payer: Self-pay

## 2021-09-04 NOTE — Telephone Encounter (Signed)
Endocrinology referral faxed to Dr. Rayburn Ma

## 2021-09-10 ENCOUNTER — Encounter: Payer: Medicare Other | Attending: Physician Assistant | Admitting: Internal Medicine

## 2021-09-10 DIAGNOSIS — L97528 Non-pressure chronic ulcer of other part of left foot with other specified severity: Secondary | ICD-10-CM | POA: Diagnosis not present

## 2021-09-10 DIAGNOSIS — Z951 Presence of aortocoronary bypass graft: Secondary | ICD-10-CM | POA: Insufficient documentation

## 2021-09-10 DIAGNOSIS — E11621 Type 2 diabetes mellitus with foot ulcer: Secondary | ICD-10-CM | POA: Diagnosis not present

## 2021-09-10 DIAGNOSIS — E1151 Type 2 diabetes mellitus with diabetic peripheral angiopathy without gangrene: Secondary | ICD-10-CM | POA: Insufficient documentation

## 2021-09-10 DIAGNOSIS — E1142 Type 2 diabetes mellitus with diabetic polyneuropathy: Secondary | ICD-10-CM | POA: Insufficient documentation

## 2021-09-10 DIAGNOSIS — K589 Irritable bowel syndrome without diarrhea: Secondary | ICD-10-CM | POA: Insufficient documentation

## 2021-09-10 DIAGNOSIS — E7800 Pure hypercholesterolemia, unspecified: Secondary | ICD-10-CM | POA: Insufficient documentation

## 2021-09-10 DIAGNOSIS — I2510 Atherosclerotic heart disease of native coronary artery without angina pectoris: Secondary | ICD-10-CM | POA: Insufficient documentation

## 2021-09-10 DIAGNOSIS — L97428 Non-pressure chronic ulcer of left heel and midfoot with other specified severity: Secondary | ICD-10-CM | POA: Diagnosis not present

## 2021-09-10 DIAGNOSIS — I10 Essential (primary) hypertension: Secondary | ICD-10-CM | POA: Insufficient documentation

## 2021-09-11 ENCOUNTER — Telehealth: Payer: Self-pay

## 2021-09-11 NOTE — Progress Notes (Signed)
TYGER, OKA ([id number]) Visit Report for 09/10/2021 Allergy List Details Patient Name: Morgan Daniels, Morgan Daniels Date of Service: 09/10/2021 10:00 AM Medical Record Number: [id number] Patient Account Number: [id number] Date of Birth/Sex: 1960/09/21 (61 y.o. F) Treating RN: Morgan Daniels Primary Care Morgan Daniels: Morgan Daniels Other Clinician: Referring Morgan Daniels: Referral, Self Treating Morgan Daniels/Extender: Morgan Daniels Weeks in Treatment: 0 Allergies Active Allergies morphine latex Allergy Notes Electronic Signature(s) Signed: 09/11/2021 4:29:23 PM By: Morgan Daniels, BSN, RN, CWS, Kim RN, BSN Entered By: Morgan Daniels on 09/10/2021 10:11:55 Morgan Daniels ([id number]) -------------------------------------------------------------------------------- Arrival Information Details Patient Name: Morgan Daniels Date of Service: 09/10/2021 10:00 AM Medical Record Number: [id number] Patient Account Number: [id number] Date of Birth/Sex: Jun 24, 1960 (61 y.o. F) Treating RN: Morgan Daniels Primary Care Morgan Daniels: Morgan Daniels Other Clinician: Referring Morgan Daniels: Referral, Self Treating Morgan Daniels/Extender: Morgan Daniels in Treatment: 0 Visit Information Patient Arrived: Ambulatory Arrival Time: 10:04 Accompanied By: husband Transfer Assistance: None Patient Identification Verified: Yes Secondary Verification Process Completed: Yes Patient Requires Transmission-Based No Precautions: Patient Has Alerts: Yes Patient Alerts: Patient on Blood Thinner Type II Diabetic Aspirin 53m AVVS 04/01/20 ABI R 1.02 L 1.04 TBI R 0.89 L 0.87 Electronic Signature(s) Signed: 09/10/2021 10:30:16 AM By: Morgan Daniels BSN, RN, CWS, Kim RN, BSN Entered By: Morgan Daniels BSN, RN, CWS, Morgan Daniels on 09/10/2021 10:30:16 TMargarita Daniels([id number] -------------------------------------------------------------------------------- Clinic Level of Care Assessment Details Patient Name: TMargarita RanaDate of Service:  09/10/2021 10:00 AM Medical Record Number: [id number] Account Number: [id number] of Birth/Sex: [birth year](61 y.o. F) Treating RN: WCornell BarmanPrimary Care Morgan Daniels: AJonetta OsgoodOther Clinician: Referring Morgan Daniels: Referral, Self Treating Morgan Daniels/Extender: RTito Dinein Treatment: 0 Clinic Level of Care Assessment Items TOOL 1 Quantity Score '[]'  - Use when EandM and Procedure is performed on INITIAL visit 0 ASSESSMENTS - Nursing Assessment / Reassessment X - General Physical Exam (combine w/ comprehensive assessment (listed just below) when performed on new 1 20 pt. evals) X- 1 25 Comprehensive Assessment (HX, ROS, Risk Assessments, Wounds Hx, etc.) ASSESSMENTS - Wound and Skin Assessment / Reassessment '[]'  - Dermatologic / Skin Assessment (not related to wound area) 0 ASSESSMENTS - Ostomy and/or Continence Assessment and Care '[]'  - Incontinence Assessment and Management 0 '[]'  - 0 Ostomy Care Assessment and Management (repouching, etc.) PROCESS - Coordination of Care X - Simple Patient / Family Education for ongoing care 1 15 '[]'  - 0 Complex (extensive) Patient / Family Education for ongoing care X- 1 10 Staff obtains CProgrammer, systems Records, Test Results / Process Orders '[]'  - 0 Staff telephones HHA, Nursing Homes / Clarify orders / etc '[]'  - 0 Routine Transfer to another Facility (non-emergent condition) '[]'  - 0 Routine Hospital Admission (non-emergent condition) X- 1 15 New Admissions / IBiomedical engineer/ Ordering NPWT, Apligraf, etc. '[]'  - 0 Emergency Hospital Admission (emergent condition) PROCESS - Special Needs '[]'  - Pediatric / Minor Patient Management 0 '[]'  - 0 Isolation Patient Management '[]'  - 0 Hearing / Language / Visual special needs '[]'  - 0 Assessment of Community assistance (transportation, D/C planning, etc.) '[]'  - 0 Additional assistance / Altered mentation '[]'  - 0 Support Surface(s) Assessment (bed, cushion, seat,  etc.) INTERVENTIONS - Miscellaneous '[]'  - External ear exam 0 '[]'  - 0 Patient Transfer (multiple staff / HCivil Service fast streamer/ Similar devices) '[]'  - 0 Simple Staple / Suture removal (25 or less) '[]'  - 0 Complex Staple / Suture removal (26 or more) '[]'  - 0 Hypo/Hyperglycemic Management (do not check  if billed separately) '[]'  - 0 Ankle / Brachial Index (ABI) - do not check if billed separately Has the patient been seen at the hospital within the last three years: Yes Total Score: 85 Level Of Care: New/Established - Level 3 Morgan Daniels, Morgan Daniels ([id number]) Electronic Signature(s) Signed: 09/11/2021 4:29:23 PM By: Morgan Daniels, BSN, RN, CWS, Kim RN, BSN Entered By: Morgan Daniels on 09/10/2021 10:53:03 Morgan Daniels ([id number]) -------------------------------------------------------------------------------- Encounter Discharge Information Details Patient Name: Morgan Daniels Date of Service: 09/10/2021 10:00 AM Medical Record Number: [id number] Patient Account Number: [id number] Date of Birth/Sex: 1960-08-22 (61 y.o. F) Treating RN: Morgan Daniels Primary Care Morgan Daniels: Morgan Daniels Other Clinician: Referring Morgan Daniels: Referral, Self Treating Morgan Daniels/Extender: Morgan Daniels in Treatment: 0 Encounter Discharge Information Items Post Procedure Vitals Discharge Condition: Stable Temperature (F): 98.4 Ambulatory Status: Cane Pulse (bpm): 68 Discharge Destination: Home Respiratory Rate (breaths/min): 16 Transportation: Private Auto Blood Pressure (mmHg): 183/96 Accompanied By: husband Schedule Follow-up Appointment: Yes Clinical Summary of Care: Electronic Signature(s) Signed: 09/11/2021 4:29:23 PM By: Morgan Daniels, BSN, RN, CWS, Kim RN, BSN Entered By: Morgan Daniels on 09/10/2021 10:54:36 Morgan Daniels ([id number]) -------------------------------------------------------------------------------- Lower Extremity Assessment Details Patient Name: Morgan Daniels Date of  Service: 09/10/2021 10:00 AM Medical Record Number: [id number] Patient Account Number: [id number] Date of Birth/Sex: 05-04-1960 (61 y.o. F) Treating RN: Morgan Daniels Primary Care Elesha Thedford: Morgan Daniels Other Clinician: Referring Cyleigh Massaro: Referral, Self Treating Jeannifer Drakeford/Extender: Morgan Daniels Weeks in Treatment: 0 Edema Assessment Assessed: [Left: Yes] [Right: No] Edema: [Left: N] [Right: o] Vascular Assessment Pulses: Dorsalis Pedis Palpable: [Left:Yes] Posterior Tibial Palpable: [Left:Yes] Electronic Signature(s) Signed: 09/11/2021 4:29:23 PM By: Morgan Daniels, BSN, RN, CWS, Kim RN, BSN Entered By: Morgan Daniels on 09/10/2021 10:27:15 Morgan Daniels, Morgan Daniels ([id number]) -------------------------------------------------------------------------------- Multi Wound Chart Details Patient Name: Morgan Daniels Date of Service: 09/10/2021 10:00 AM Medical Record Number: [id number] Patient Account Number: [id number] Date of Birth/Sex: [birth year] (61 y.o. F) Treating RN: Morgan Daniels Primary Care Mikaili Flippin: Morgan Daniels Other Clinician: Referring Jetty Berland: Referral, Self Treating Yolinda Duerr/Extender: Morgan Daniels in Treatment: 0 Vital Signs Height(in): 67 Pulse(bpm): 72 Weight(lbs): 200 Blood Pressure(mmHg): 183/96 Body Mass Index(BMI): 31.3 Temperature(F): 98.4 Respiratory Rate(breaths/min): 16 Photos: [N/A:N/A] Wound Location: Left Calcaneus N/A N/A Wounding Event: Gradually Appeared N/A N/A Primary Etiology: Diabetic Wound/Ulcer of the Lower N/A N/A Extremity Comorbid History: Coronary Artery Disease, N/A N/A Hypertension, Myocardial Infarction, Type II Diabetes, Neuropathy Date Acquired: 04/17/2021 N/A N/A Weeks of Treatment: 0 N/A N/A Wound Status: Open N/A N/A Wound Recurrence: No N/A N/A Measurements L x W x D (cm) 1.2x1.5x0.1 N/A N/A Area (cm) : 1.414 N/A N/A Volume (cm) : 0.141 N/A N/A % Reduction in Area: 0.00% N/A N/A % Reduction in Volume:  0.00% N/A N/A Classification: Unable to visualize wound bed N/A N/A Exudate Amount: None Present N/A N/A Wound Margin: Flat and Intact N/A N/A Granulation Amount: None Present (0%) N/A N/A Necrotic Amount: Large (67-100%) N/A N/A Necrotic Tissue: Eschar N/A N/A Exposed Structures: Fascia: No N/A N/A Fat Layer (Subcutaneous Tissue): No Tendon: No Muscle: No Joint: No Bone: No Epithelialization: None N/A N/A Debridement: Chemical/Enzymatic/Mechanical N/A N/A Pre-procedure Verification/Time 10:45 N/A N/A Out Taken: Instrument: Other(tongue blade) N/A N/A Bleeding: None N/A N/A Debridement Treatment Procedure was tolerated well N/A N/A Response: Post Debridement 1.2x1.5x0.1 N/A N/A Measurements L x W x D (cm) Post Debridement Volume: 0.141 N/A N/A (cm) Procedures Performed: Debridement N/A N/A Morgan Daniels, Morgan Daniels ([id number]) Treatment Notes  Wound #1 (Calcaneus) Wound Laterality: Left Cleanser [place]ram Ancillary Kit - 15 Day Supply Discharge Instruction: Use supplies as instructed; Kit contains: (15) Saline Bullets; (15) 3x3 Gauze; 15 pr Gloves Peri-Wound Care Topical Santyl Collagenase Ointment, 30 (gm), tube Discharge Instruction: apply nickel thick to wound bed only Primary Dressing Gauze Discharge Instruction: As directed: dry, moistened with saline or moistened with [place] Roll-Medium Discharge Instruction: [place] as directed Secured With [place] Soft Cloth Surgical Tape, 2x2 (in/yd) Compression Wrap Compression Stockings Add-Ons Electronic Signature(s) Signed: 09/10/2021 4:13:45 PM By: Linton Ham MD Entered By: Linton Ham on 09/10/2021 12:36:14 Morgan Daniels ([id number]) -------------------------------------------------------------------------------- [place] Details Patient Name: Morgan Daniels Date of Service: 09/10/2021 10:00  AM Medical Record Number: [id number] Patient Account Number: [id number] Date of Birth/Sex: Oct 05, 1960 (62 y.o. F) Treating RN: Morgan Daniels Primary Care Lory Nowaczyk: Morgan Daniels Other Clinician: Referring Zynia Wojtowicz: Referral, Self Treating Zamauri Nez/Extender: Morgan Daniels in Treatment: 0 Active Inactive Necrotic Tissue Nursing Diagnoses: Impaired tissue integrity related to necrotic/devitalized tissue Knowledge deficit related to management of necrotic/devitalized tissue Goals: Necrotic/devitalized tissue will be minimized in the wound bed Date Initiated: 09/10/2021 Target Resolution Date: 09/10/2021 Goal Status: Active Patient/caregiver will verbalize understanding of reason and process for debridement of necrotic tissue Date Initiated: 09/10/2021 Target Resolution Date: 09/10/2021 Goal Status: Active Interventions: Assess patient pain level pre-, during and post procedure and prior to discharge Provide education on necrotic tissue and debridement process Treatment Activities: Excisional debridement : 09/10/2021 Notes: Orientation to the Wound Care Program Nursing Diagnoses: Knowledge deficit related to the wound healing center program Goals: Patient/caregiver will verbalize understanding of the [facility] Program Date Initiated: 09/10/2021 Target Resolution Date: 09/10/2021 Goal Status: Active Interventions: Provide education on orientation to the wound center Notes: Wound/Skin Impairment Nursing Diagnoses: Impaired tissue integrity Goals: Patient/caregiver will verbalize understanding of skin care regimen Date Initiated: 09/10/2021 Target Resolution Date: 09/10/2021 Goal Status: Active Ulcer/skin breakdown will have a volume reduction of 30% by week 4 Date Initiated: 09/10/2021 Target Resolution Date: 10/08/2021 Goal Status: Active Interventions: Assess patient/caregiver ability to obtain necessary supplies Assess patient/caregiver ability to perform  ulcer/skin care regimen upon admission and as needed Morgan Daniels, Morgan Daniels ([id number]) Assess ulceration(s) every visit Treatment Activities: Skin care regimen initiated : 09/10/2021 Topical wound management initiated : 09/10/2021 Notes: Electronic Signature(s) Signed: 09/11/2021 4:29:23 PM By: Morgan Daniels, BSN, RN, CWS, Kim RN, BSN Entered By: Morgan Daniels on 09/10/2021 10:44:36 Morgan Daniels ([id number]) -------------------------------------------------------------------------------- Pain Assessment Details Patient Name: Morgan Daniels Date of Service: 09/10/2021 10:00 AM Medical Record Number: [id number] Patient Account Number: [id number] Date of Birth/Sex: October 15, 1960 (61 y.o. F) Treating RN: Morgan Daniels Primary Care Kevonna Nolte: Morgan Daniels Other Clinician: Referring Barre Aydelott: Referral, Self Treating Payeton Germani/Extender: Morgan Daniels in Treatment: 0 Active Problems Location of Pain Severity and Description of Pain Patient Has Paino Yes Site Locations Pain Location: Pain in Ulcers Rate the pain. Current Pain Level: 5 Character of Pain Describe the Pain: Shooting, Throbbing Pain Management and Medication Current Pain Management: Electronic Signature(s) Signed: 09/11/2021 4:29:23 PM By: Morgan Daniels, BSN, RN, CWS, Kim RN, BSN Entered By: Morgan Daniels on 09/10/2021 10:11:02 Morgan Daniels ([id number]) -------------------------------------------------------------------------------- Patient/Caregiver Education Details Patient Name: Morgan Daniels Date of Service: 09/10/2021 10:00 AM Medical Record Number: [id number] Patient Account Number: [id number] Date of Birth/Gender: 1960-10-13 (61 y.o. F) Treating RN: Morgan Daniels Primary Care Physician: Valetta Fuller,  Alyssa Other Clinician: Referring Physician: Referral, Self Treating Physician/Extender: Morgan Daniels in Treatment: 0 Education Assessment Education Provided To: Patient Education Topics  Provided Welcome To [facility]: Handouts: Welcome To [facility] Methods: Demonstration, Explain/Verbal Responses: State content correctly Wound Debridement: Handouts: Wound Debridement Methods: Demonstration, Explain/Verbal Responses: State content correctly Electronic Signature(s) Signed: 09/11/2021 4:29:23 PM By: Morgan Daniels, BSN, RN, CWS, Kim RN, BSN Entered By: Morgan Daniels on 09/10/2021 10:53:26 Morgan Daniels ([id number]) -------------------------------------------------------------------------------- Wound Assessment Details Patient Name: Morgan Daniels Date of Service: 09/10/2021 10:00 AM Medical Record Number: [id number] Patient Account Number: [id number] Date of Birth/Sex: 02/22/1960 (61 y.o. F) Treating RN: Morgan Daniels Primary Care Jaxxson Cavanah: Morgan Daniels Other Clinician: Referring Zyhir Cappella: Referral, Self Treating Morgan Daniels/Extender: Morgan Daniels in Treatment: 0 Wound Status Wound Number: 1 Primary Diabetic Wound/Ulcer of the Lower Extremity Etiology: Wound Location: Left Calcaneus Wound Open Wounding Event: Gradually Appeared Status: Date Acquired: 04/17/2021 Comorbid Coronary Artery Disease, Hypertension, Myocardial Weeks Of Treatment: 0 History: Infarction, Type II Diabetes, Neuropathy Clustered Wound: No Photos Wound Measurements Length: (cm) 1.2 Width: (cm) 1.5 Depth: (cm) 0.1 Area: (cm) 1.414 Volume: (cm) 0.141 % Reduction in Area: 0% % Reduction in Volume: 0% Epithelialization: None Wound Description Classification: Unable to visualize wound bed Wound Margin: Flat and Intact Exudate Amount: None Present Foul Odor After Cleansing: No Slough/Fibrino No Wound Bed Granulation Amount: None Present (0%) Exposed Structure Necrotic Amount: Large (67-100%) Fascia Exposed: No Necrotic Quality: Eschar Fat Layer (Subcutaneous Tissue) Exposed: No Tendon Exposed: No Muscle Exposed: No Joint Exposed: No Bone  Exposed: No Treatment Notes Wound #1 (Calcaneus) Wound Laterality: Left Cleanser [place] Ancillary Kit - 15 Day Supply Discharge Instruction: Use supplies as instructed; Kit contains: (15) Saline Bullets; (15) 3x3 Gauze; 15 pr Gloves Peri-Wound Care Topical JAKYRA, KENEALY ([id number]) Santyl Collagenase Ointment, 30 (gm), tube Discharge Instruction: apply nickel thick to wound bed only Primary Dressing Gauze Discharge Instruction: As directed: dry, moistened with saline or moistened with [place] Roll-Medium Discharge Instruction: [place] as directed Secured With Medipore Tape - 25M Medipore H Soft Cloth Surgical Tape, 2x2 (in/yd) Compression Wrap Compression Stockings Environmental education officer) Signed: 09/11/2021 4:29:23 PM By: Morgan Daniels, BSN, RN, CWS, Kim RN, BSN Entered By: Morgan Daniels on 09/10/2021 10:25:40 Morgan Daniels ([id number]) -------------------------------------------------------------------------------- Vitals Details Patient Name: Morgan Daniels Date of Service: 09/10/2021 10:00 AM Medical Record Number: [id number] Patient Account Number: [id number] Date of Birth/Sex: 09-14-1960 (61 y.o. F) Treating RN: Morgan Daniels Primary Care Kamyia Thomason: Morgan Daniels Other Clinician: Referring Olliver Boyadjian: Referral, Self Treating Justino Boze/Extender: Morgan Daniels in Treatment: 0 Vital Signs Time Taken: 10:11 Temperature (F): 98.4 Height (in): 67 Pulse (bpm): 68 Weight (lbs): 200 Respiratory Rate (breaths/min): 16 Body Mass Index (BMI): 31.3 Blood Pressure (mmHg): 183/96 Reference Range: 80 - 120 mg / dl Electronic Signature(s) Signed: 09/11/2021 4:29:23 PM By: Morgan Daniels, BSN, RN, CWS, Kim RN, BSN Entered By: Morgan Daniels on 09/10/2021 10:11:26

## 2021-09-11 NOTE — Telephone Encounter (Signed)
Left another vm for patient to return my call regarding endocrinology referral-Toni

## 2021-09-11 NOTE — Progress Notes (Signed)
Morgan Daniels, Morgan Daniels ([id number]) Visit Report for 09/10/2021 Chief Complaint Document Details Patient Name: Morgan Daniels, Morgan Daniels Date of Service: 09/10/2021 10:00 AM Medical Record Number: [id number] Patient Account Number: [id number] Date of Birth/Sex: [birth year] (61 y.o. F) Treating RN: Cornell Barman Primary Care Provider: Jonetta Osgood Other Clinician: Referring Provider: Referral, Self Treating Provider/Extender: Tito Dine in Treatment: 0 Information Obtained from: Patient Chief Complaint 09/11/2022; patient comes in for review of an area on the tip of her left heel Electronic Signature(s) Signed: 09/10/2021 4:13:45 PM By: Linton Ham MD Entered By: Linton Ham on 09/10/2021 12:37:03 Morgan Daniels ([id number]) -------------------------------------------------------------------------------- Debridement Details Patient Name: Morgan Daniels Date of Service: 09/10/2021 10:00 AM Medical Record Number: [id number] Patient Account Number: [id number] Date of Birth/Sex: 1960-08-02 (61 y.o. F) Treating RN: Cornell Barman Primary Care Provider: Jonetta Osgood Other Clinician: Referring Provider: Referral, Self Treating Provider/Extender: Tito Dine in Treatment: 0 Debridement Performed for Wound #1 Left Calcaneus Assessment: Performed By: Physician Ricard Dillon, MD Debridement Type: Chemical/Enzymatic/Mechanical Agent Used: Santyl Severity of Tissue Pre Debridement: Fat layer exposed Level of Consciousness (Pre- Awake and Alert procedure): Pre-procedure Verification/Time Out Yes - 10:45 Taken: Instrument: Other : tongue blade Bleeding: None Response to Treatment: Procedure was tolerated well Level of Consciousness (Post- Awake and Alert procedure): Post Debridement Measurements of Total Wound Length: (cm) 1.2 Width: (cm) 1.5 Depth: (cm) 0.1 Volume: (cm) 0.141 Character of Wound/Ulcer Post Debridement: Stable Severity of Tissue Post  Debridement: Other severity specified Post Procedure Diagnosis Same as Pre-procedure Electronic Signature(s) Signed: 09/10/2021 4:13:45 PM By: Linton Ham MD Signed: 09/11/2021 4:29:23 PM By: Gretta Cool, BSN, RN, CWS, Kim RN, BSN Entered By: Gretta Cool, BSN, RN, CWS, Kim on 09/10/2021 10:47:04 Morgan Daniels, Morgan Daniels ([id number]) -------------------------------------------------------------------------------- HPI Details Patient Name: Morgan Daniels Date of Service: 09/10/2021 10:00 AM Medical Record Number: [id number] Patient Account Number: [id number] Date of Birth/Sex: 03-11-1960 (61 y.o. F) Treating RN: Cornell Barman Primary Care Provider: Jonetta Osgood Other Clinician: Referring Provider: Referral, Self Treating Provider/Extender: Tito Dine in Treatment: 0 History of Present Illness HPI Description: ADMISSION 09/10/2021 This is a 61 year old woman who arrived in clinic accompanied by her husband. She is here for review of a area on the tip of her left heel which has been present by her description for about 3 months. I did not see any description of this in care everywhere. She is a type II diabetic with peripheral arterial disease and peripheral neuropathy. In fact she has had 2 procedures by Dr. Lucky Cowboy of vein and vascular 1 on 01/03/2021 at which time she had a angioplasty of the left anterior tibial artery and a mechanical thrombectomy of the left SFA and proximal popliteal. She underwent that she then underwent an angioplasty of the left SFA and pop and proximal popliteal and a stent in the left SFA. On 02/28/2021 she again went underwent a mechanical thrombectomy of the left SFA and popliteal arteries and a stent placement in the left SFA and proximal popliteal as well as reangioplasty of the left anterior tibial. The patient states that the area on the left heel started about 3 months ago it is scabbed over and she cannot get this to heal. She is using a foot cream that was given  to her by podiatry [foot miracle]. She also has an area on the right heel medially almost mirror image to the left although there is no open wound here the skin in the area is dry and scaly. There is  no evidence of infection Past medical history includes type 2 diabetes with PAD and peripheral neuropathy, coronary artery disease status post CABG x4, depression, hypertension, hypercholesterolemia, irritable bowel, carotid artery stenosis Electronic Signature(s) Signed: 09/10/2021 4:13:45 PM By: Linton Ham MD Entered By: Linton Ham on 09/10/2021 12:40:25 Morgan Daniels ([id number]) -------------------------------------------------------------------------------- Physical Exam Details Patient Name: Morgan Daniels Date of Service: 09/10/2021 10:00 AM Medical Record Number: [id number] Patient Account Number: [id number] Date of Birth/Sex: 1960-10-29 (61 y.o. F) Treating RN: Cornell Barman Primary Care Provider: Jonetta Osgood Other Clinician: Referring Provider: Referral, Self Treating Provider/Extender: Tito Dine in Treatment: 0 Constitutional Patient is hypertensive.. Pulse regular and within target range for patient.[name] Respirations regular, non-labored and within target range.. Temperature is normal and within the target range for the patient.[name] appears in no distress. Cardiovascular Nice easily palpable peripheral pulses at the dorsalis pedis and posterior tibial. Her foot is warm. Notes Wound exam; tip of the left heel covered in a adherent dry black eschar. There is no tenderness around this no purulence. She also has a Personal assistant area on the medial aspect of the right heel but no open wound in this area. Electronic Signature(s) Signed: 09/10/2021 4:13:45 PM By: Linton Ham MD Entered By: Linton Ham on 09/10/2021 12:42:52 Morgan Daniels ([id number]) -------------------------------------------------------------------------------- Physician Orders  Details Patient Name: Morgan Daniels Date of Service: 09/10/2021 10:00 AM Medical Record Number: [id number] Patient Account Number: [id number] Date of Birth/Sex: Feb 02, 1961 (61 y.o. F) Treating RN: Cornell Barman Primary Care Provider: Jonetta Osgood Other Clinician: Referring Provider: Referral, Self Treating Provider/Extender: Tito Dine in Treatment: 0 Verbal / Phone Orders: No Diagnosis Coding Follow-up Appointments o Return Appointment in 2 weeks. Bathing/ Shower/ Hygiene o May shower; gently cleanse wound with antibacterial soap, rinse and pat dry prior to dressing wounds Wound Treatment Wound #1 - Calcaneus Wound Laterality: Left Cleanser: Byram Ancillary Kit - 15 Day Supply (DME) (Generic) 1 x Per Day/30 Days Discharge Instructions: Use supplies as instructed; Kit contains: (15) Saline Bullets; (15) 3x3 Gauze; 15 pr Gloves Topical: Santyl Collagenase Ointment, 30 (gm), tube 1 x Per Day/30 Days Discharge Instructions: apply nickel thick to wound bed only Primary Dressing: Gauze (DME) (Generic) 1 x Per Day/30 Days Discharge Instructions: As directed: dry, moistened with saline or moistened with Dakins Solution Secondary Dressing: Conforming Guaze Roll-Medium (DME) (Generic) 1 x Per Day/30 Days Discharge Instructions: Apply Conforming Stretch Guaze Bandage as directed Secured With: Medipore Tape - 53M Medipore H Soft Cloth Surgical Tape, 2x2 (in/yd) (DME) (Generic) 1 x Per Day/30 Days Patient Medications Allergies: morphine, latex Notifications Medication Indication Start End Santyl 09/10/2021 DOSE topical 250 unit/gram ointment - ointment topical to wound left heel change daily Electronic Signature(s) Signed: 09/10/2021 11:03:16 AM By: Linton Ham MD Entered By: Linton Ham on 09/10/2021 11:03:15 Morgan Daniels ([id number]) -------------------------------------------------------------------------------- Problem List Details Patient Name: Morgan Daniels Date of Service: 09/10/2021 10:00 AM Medical Record Number: [id number] Patient Account Number: [id number] Date of Birth/Sex: [birth year] (61 y.o. F) Treating RN: Cornell Barman Primary Care Provider: Jonetta Osgood Other Clinician: Referring Provider: Referral, Self Treating Provider/Extender: Tito Dine in Treatment: 0 Active Problems ICD-10 Encounter Code Description Active Date MDM Diagnosis E11.621 Type 2 diabetes mellitus with foot ulcer 09/10/2021 No Yes L97.528 Non-pressure chronic ulcer of other part of left foot with other specified 09/10/2021 No Yes severity E11.51 Type 2 diabetes mellitus with diabetic peripheral angiopathy without 09/10/2021 No Yes gangrene E11.42 Type 2 diabetes mellitus with  diabetic polyneuropathy 09/10/2021 No Yes Inactive Problems Resolved Problems Electronic Signature(s) Signed: 09/10/2021 4:13:45 PM By: Linton Ham MD Entered By: Linton Ham on 09/10/2021 10:57:56 Morgan Daniels ([id number]) -------------------------------------------------------------------------------- Progress Note Details Patient Name: Morgan Daniels Date of Service: 09/10/2021 10:00 AM Medical Record Number: [id number] Patient Account Number: [id number] Date of Birth/Sex: 1960/05/16 (61 y.o. F) Treating RN: Cornell Barman Primary Care Provider: Jonetta Osgood Other Clinician: Referring Provider: Referral, Self Treating Provider/Extender: Tito Dine in Treatment: 0 Subjective Chief Complaint Information obtained from Patient 09/11/2022; patient comes in for review of an area on the tip of her left heel History of Present Illness (HPI) ADMISSION 09/10/2021 This is a 61 year old woman who arrived in clinic accompanied by her husband. She is here for review of a area on the tip of her left heel which has been present by her description for about 3 months. I did not see any description of this in care everywhere. She is a type II diabetic  with peripheral arterial disease and peripheral neuropathy. In fact she has had 2 procedures by Dr. Lucky Cowboy of vein and vascular 1 on 01/03/2021 at which time she had a angioplasty of the left anterior tibial artery and a mechanical thrombectomy of the left SFA and proximal popliteal. She underwent that she then underwent an angioplasty of the left SFA and pop and proximal popliteal and a stent in the left SFA. On 02/28/2021 she again went underwent a mechanical thrombectomy of the left SFA and popliteal arteries and a stent placement in the left SFA and proximal popliteal as well as reangioplasty of the left anterior tibial. The patient states that the area on the left heel started about 3 months ago it is scabbed over and she cannot get this to heal. She is using a foot cream that was given to her by podiatry [foot miracle]. She also has an area on the right heel medially almost mirror image to the left although there is no open wound here the skin in the area is dry and scaly. There is no evidence of infection Past medical history includes type 2 diabetes with PAD and peripheral neuropathy, coronary artery disease status post CABG x4, depression, hypertension, hypercholesterolemia, irritable bowel, carotid artery stenosis Patient History Information obtained from Patient, Chart. Allergies morphine, latex Social History Former smoker - 12/2020, Marital Status - Married, Alcohol Use - Rarely, Drug Use - No History, Caffeine Use - Daily. Medical History Cardiovascular Patient has history of Coronary Artery Disease, Hypertension, Myocardial Infarction - 12/2020 Endocrine Patient has history of Type II Diabetes Neurologic Patient has history of Neuropathy Patient is treated with Insulin. Blood sugar results noted at the following times: Bedtime - 137. Review of Systems (ROS) Eyes Complains or has symptoms of Glasses / Contacts. Cardiovascular Complains or has symptoms of LE  edema. Genitourinary Denies complaints or symptoms of Kidney failure/ Dialysis, Incontinence/dribbling. Immunological Denies complaints or symptoms of Hives, Itching. Integumentary (Skin) Complains or has symptoms of Wounds. Denies complaints or symptoms of Bleeding or bruising tendency. Musculoskeletal Denies complaints or symptoms of Muscle Pain, Muscle Weakness. Psychiatric Complains or has symptoms of Anxiety, depression Morgan Daniels, Morgan Daniels. ([id number]) Objective Constitutional Patient is hypertensive.. Pulse regular and within target range for patient.[name] Respirations regular, non-labored and within target range.. Temperature is normal and within the target range for the patient.[name] appears in no distress. Vitals Time Taken: 10:11 AM, Height: 67 in, Weight: 200 lbs, BMI: 31.3, Temperature: 98.4 F, Pulse: 68 bpm, Respiratory Rate: 16 breaths/min,  Blood Pressure: 183/96 mmHg. Cardiovascular Nice easily palpable peripheral pulses at the dorsalis pedis and posterior tibial. Her foot is warm. General Notes: Wound exam; tip of the left heel covered in a adherent dry black eschar. There is no tenderness around this no purulence. She also has a Personal assistant area on the medial aspect of the right heel but no open wound in this area. Integumentary (Hair, Skin) Wound #1 status is Open. Original cause of wound was Gradually Appeared. The date acquired was: 04/17/2021. The wound is located on the Left Calcaneus. The wound measures 1.2cm length x 1.5cm width x 0.1cm depth; 1.414cm^2 area and 0.141cm^3 volume. There is a none present amount of drainage noted. The wound margin is flat and intact. There is no granulation within the wound bed. There is a large (67-100%) amount of necrotic tissue within the wound bed including Eschar. Assessment Active Problems ICD-10 Type 2 diabetes mellitus with foot ulcer Non-pressure chronic ulcer of other part of left foot with other specified severity Type 2  diabetes mellitus with diabetic peripheral angiopathy without gangrene Type 2 diabetes mellitus with diabetic polyneuropathy Procedures Wound #1 Pre-procedure diagnosis of Wound #1 is a Diabetic Wound/Ulcer of the Lower Extremity located on the Left Calcaneus .Severity of Tissue Pre Debridement is: Fat layer exposed. There was a Chemical/Enzymatic/Mechanical debridement performed by Ricard Dillon, MD. With the following instrument(s): tongue blade. Agent used was [employer]. A time out was conducted at 10:45, prior to the start of the procedure. There was no bleeding. The procedure was tolerated well. Post Debridement Measurements: 1.2cm length x 1.5cm width x 0.1cm depth; 0.141cm^3 volume. Character of Wound/Ulcer Post Debridement is stable. Severity of Tissue Post Debridement is: Other severity specified. Post procedure Diagnosis Wound #1: Same as Pre-Procedure Plan Follow-up Appointments: Return Appointment in 2 weeks. Bathing/ Shower/ Hygiene: May shower; gently cleanse wound with antibacterial soap, rinse and pat dry prior to dressing wounds The following medication(s) was prescribed: Santyl topical 250 unit/gram ointment ointment topical to wound left heel change daily starting 09/10/2021 WOUND #1: - Calcaneus Wound Laterality: Left Cleanser: Byram Ancillary Kit - 15 Day Supply (DME) (Generic) 1 x Per Day/30 Days Discharge Instructions: Use supplies as instructed; Kit contains: (15) Saline Bullets; (15) 3x3 Gauze; 15 pr Gloves Topical: Santyl Collagenase Ointment, 30 (gm), tube 1 x Per Day/30 Days Discharge Instructions: apply nickel thick to wound bed only Morgan Daniels, Morgan Daniels. ([id number]) Primary Dressing: Gauze (DME) (Generic) 1 x Per Day/30 Days Discharge Instructions: As directed: dry, moistened with saline or moistened with Dakins Solution Secondary Dressing: Conforming Guaze Roll-Medium (DME) (Generic) 1 x Per Day/30 Days Discharge Instructions: Apply Conforming Stretch Guaze  Bandage as directed Secured With: Medipore Tape - 88M Medipore H Soft Cloth Surgical Tape, 2x2 (in/yd) (DME) (Generic) 1 x Per Day/30 Days 1. What looks to be predominantly a pressure ulcer on the left heel mostly I am stating this because she has a mirror image area on the right heel but no open wound. I suspect she is putting excess pressure on this at night. She does put pillows under her legs but her legs fall off the pillows in her sleep. 2. She walks with a cane therefore aggressive offloading in this area may be dangerous. We did give her a surgical shoe to replace her own sandal. 3. I have written a prescription for Santyl change daily. Emphasized offloading at all times of the day. Her husband states that she does not walk that much but she does sit  in a recliner chair although she states her heels are not on the foot rest. 4. The patient complains of a lot of pain but this sounds like dysesthesia from peripheral neuropathy. 5. The condition of the vasculature in her left foot was really quite good palpation of her pulses was vibrant. She last had noninvasive studies in February at which time her ABI was normal at 1.04 TBI at 0.87 with triphasic waveforms Electronic Signature(s) Signed: 09/10/2021 4:13:45 PM By: Linton Ham MD Entered By: Linton Ham on 09/10/2021 12:45:46 Morgan Daniels ([id number]) -------------------------------------------------------------------------------- ROS/PFSH Details Patient Name: Morgan Daniels Date of Service: 09/10/2021 10:00 AM Medical Record Number: [id number] Patient Account Number: [id number] Date of Birth/Sex: May 14, 1960 (61 y.o. F) Treating RN: Cornell Barman Primary Care Provider: Jonetta Osgood Other Clinician: Referring Provider: Referral, Self Treating Provider/Extender: Tito Dine in Treatment: 0 Information Obtained From Patient Chart Eyes Complaints and Symptoms: Positive for: Glasses /  Contacts Cardiovascular Complaints and Symptoms: Positive for: LE edema Medical History: Positive for: Coronary Artery Disease; Hypertension; Myocardial Infarction - 12/2020 Genitourinary Complaints and Symptoms: Negative for: Kidney failure/ Dialysis; Incontinence/dribbling Immunological Complaints and Symptoms: Negative for: Hives; Itching Integumentary (Skin) Complaints and Symptoms: Positive for: Wounds Negative for: Bleeding or bruising tendency Musculoskeletal Complaints and Symptoms: Negative for: Muscle Pain; Muscle Weakness Psychiatric Complaints and Symptoms: Positive for: Anxiety Review of System Notes: depression Endocrine Medical History: Positive for: Type II Diabetes Time with diabetes: 20 years Treated with: Insulin Blood sugar testing results: Bedtime: 137 Neurologic Medical History: Positive for: Neuropathy Morgan Daniels, Morgan Daniels ([id number]) Oncologic Immunizations Pneumococcal Vaccine: Received Pneumococcal Vaccination: No Implantable Devices No devices added Family and Social History Former smoker - 12/2020; Marital Status - Married; Alcohol Use: Rarely; Drug Use: No History; Caffeine Use: Daily Electronic Signature(s) Signed: 09/10/2021 4:13:45 PM By: Linton Ham MD Signed: 09/11/2021 4:29:23 PM By: Gretta Cool BSN, RN, CWS, Kim RN, BSN Entered By: Gretta Cool, BSN, RN, CWS, Kim on 09/10/2021 10:15:50 Morgan Daniels, Morgan Daniels ([id number]) -------------------------------------------------------------------------------- SuperBill Details Patient Name: Morgan Daniels Date of Service: 09/10/2021 Medical Record Number: [id number] Patient Account Number: [id number] Date of Birth/Sex: [birth year] (61 y.o. F) Treating RN: Cornell Barman Primary Care Provider: Jonetta Osgood Other Clinician: Referring Provider: Referral, Self Treating Provider/Extender: Tito Dine in Treatment: 0 Diagnosis Coding ICD-10 Codes Code Description E11.621 Type 2 diabetes  mellitus with foot ulcer L97.528 Non-pressure chronic ulcer of other part of left foot with other specified severity E11.51 Type 2 diabetes mellitus with diabetic peripheral angiopathy without gangrene E11.42 Type 2 diabetes mellitus with diabetic polyneuropathy Facility Procedures CPT4 Code: [id number] Description: [facility] VISIT-LEV 3 EST PT Modifier: Quantity: 1 CPT4 Code: [id number] Description: 01314 - [place] W/O ANES NON SELECT Modifier: Quantity: 1 Physician Procedures CPT4 Code: [id number] Description: 97282 - WC PHYS LEVEL 4 - NEW PT Modifier: Quantity: 1 CPT4 Code: Description: ICD-10 Diagnosis Description E11.621 Type 2 diabetes mellitus with foot ulcer L97.528 Non-pressure chronic ulcer of other part of left foot with other specifie E11.51 Type 2 diabetes mellitus with diabetic peripheral angiopathy without gang  E11.42 Type 2 diabetes mellitus with diabetic polyneuropathy Modifier: d severity rene Quantity: Electronic Signature(s) Signed: 09/10/2021 4:13:45 PM By: Linton Ham MD Entered By: Linton Ham on 09/10/2021 12:46:30

## 2021-09-11 NOTE — Progress Notes (Signed)
FALLYNN, GRAVETT ([id number]) Visit Report for 09/10/2021 Abuse Risk Screen Details Patient Name: Morgan Daniels, Morgan Daniels Date of Service: 09/10/2021 10:00 AM Medical Record Number: [id number] Patient Account Number: [id number] Date of Birth/Sex: 06-06-1960 (61 y.o. F) Treating RN: Cornell Barman Primary Care Manu Rubey: Jonetta Osgood Other Clinician: Referring Jazzalyn Loewenstein: Referral, Self Treating Daouda Lonzo/Extender: Tito Dine in Treatment: 0 Abuse Risk Screen Items Answer ABUSE RISK SCREEN: Has anyone close to you tried to hurt or harm you recentlyo No Do you feel uncomfortable with anyone in your familyo No Has anyone forced you do things that you didnot want to doo No Electronic Signature(s) Signed: 09/11/2021 4:29:23 PM By: Gretta Cool, BSN, RN, CWS, Kim RN, BSN Entered By: Gretta Cool, BSN, RN, CWS, Kim on 09/10/2021 10:15:57 Morgan Daniels ([id number]) -------------------------------------------------------------------------------- Activities of Daily Living Details Patient Name: Morgan Daniels Date of Service: 09/10/2021 10:00 AM Medical Record Number: [id number] Patient Account Number: [id number] Date of Birth/Sex: 06/26/1960 (61 y.o. F) Treating RN: Cornell Barman Primary Care Collins Dimaria: Jonetta Osgood Other Clinician: Referring Shashank Kwasnik: Referral, Self Treating Theon Sobotka/Extender: Tito Dine in Treatment: 0 Activities of Daily Living Items Answer Activities of Daily Living (Please select one for each item) Drive Automobile Completely Able Take Medications Completely Able Use Telephone Completely Able Care for Appearance Completely Able Use Toilet Completely Able Bath / Shower Completely Able Dress Self Completely Able Feed Self Completely Able Walk Completely Able Get In / Out Bed Completely Able Housework Completely Able Prepare Meals Completely [place] for Self Completely Able Electronic Signature(s) Signed: 09/11/2021 4:29:23 PM  By: Gretta Cool, BSN, RN, CWS, Kim RN, BSN Entered By: Gretta Cool, BSN, RN, CWS, Kim on 09/10/2021 10:16:17 Morgan Daniels ([id number]) -------------------------------------------------------------------------------- Education Screening Details Patient Name: Morgan Daniels Date of Service: 09/10/2021 10:00 AM Medical Record Number: [id number] Patient Account Number: [id number] Date of Birth/Sex: 1960-11-05 (61 y.o. F) Treating RN: Cornell Barman Primary Care Morganne Haile: Jonetta Osgood Other Clinician: Referring Tamalyn Wadsworth: Referral, Self Treating Charli Halle/Extender: Tito Dine in Treatment: 0 Primary Learner Assessed: Patient Learning Preferences/Education Level/Primary Language Learning Preference: Explanation, Demonstration Highest Education Level: College or Above Preferred Language: English Cognitive Barrier Language Barrier: No Translator Needed: No Memory Deficit: No Emotional Barrier: No Physical Barrier Impaired Vision: Yes Glasses Impaired Hearing: No Decreased Hand dexterity: No Knowledge/Comprehension Knowledge Level: High Comprehension Level: High Ability to understand written instructions: High Ability to understand verbal instructions: High Motivation Anxiety Level: Calm Cooperation: Cooperative Education Importance: Acknowledges Need Interest in Health Problems: Uninterested Perception: Coherent Willingness to Engage in Self-Management High Activities: Readiness to Engage in Self-Management High Activities: Engineer, maintenance) Signed: 09/11/2021 4:29:23 PM By: Gretta Cool, BSN, RN, CWS, Kim RN, BSN Entered By: Gretta Cool, BSN, RN, CWS, Kim on 09/10/2021 10:16:52 HANSINI, CLODFELTER ([id number]) -------------------------------------------------------------------------------- Fall Risk Assessment Details Patient Name: Morgan Daniels Date of Service: 09/10/2021 10:00 AM Medical Record Number: [id number] Patient Account Number: [id number] Date of Birth/Sex: 20-Nov-1960  (61 y.o. F) Treating RN: Cornell Barman Primary Care Loden Laurent: Jonetta Osgood Other Clinician: Referring Evalette Montrose: Referral, Self Treating Tarnisha Kachmar/Extender: Tito Dine in Treatment: 0 Fall Risk Assessment Items Have you had 2 or more falls in the last 12 monthso 0 Yes Have you had any fall that resulted in injury in the last 12 monthso 0 No FALLS RISK SCREEN History of falling - immediate or within 3 months 25 Yes Secondary diagnosis (Do you have 2 or more medical diagnoseso) 0 No Ambulatory aid None/bed rest/wheelchair/nurse 0 No Crutches/cane/walker 0 No  Furniture 30 Yes Intravenous therapy Access/Saline/Heparin Lock 0 No Gait/Transferring Normal/ bed rest/ wheelchair 0 No Weak (short steps with or without shuffle, stooped but able to lift head while walking, may 10 Yes seek support from furniture) Impaired (short steps with shuffle, may have difficulty arising from chair, head down, impaired 0 No balance) Mental Status Oriented to own ability 0 Yes Electronic Signature(s) Signed: 09/11/2021 4:29:23 PM By: Gretta Cool, BSN, RN, CWS, Kim RN, BSN Entered By: Gretta Cool, BSN, RN, CWS, Kim on 09/10/2021 10:17:22 Morgan Daniels ([id number]) -------------------------------------------------------------------------------- Foot Assessment Details Patient Name: Morgan Daniels Date of Service: 09/10/2021 10:00 AM Medical Record Number: [id number] Patient Account Number: [id number] Date of Birth/Sex: [birth year] (61 y.o. F) Treating RN: Cornell Barman Primary Care [place] Millon: Jonetta Osgood Other Clinician: Referring Brietta Manso: Referral, Self Treating Aubre Quincy/Extender: Tito Dine in Treatment: 0 Foot Assessment Items Site Locations + = Sensation present, - = Sensation absent, C = Callus, U = Ulcer R = Redness, W = Warmth, M = Maceration, PU = Pre-ulcerative lesion F = Fissure, S = Swelling, D = Dryness Assessment Right: Left: Other Deformity: No No Prior Foot Ulcer:  No No Prior Amputation: No No Charcot Joint: No No Ambulatory Status: Ambulatory With Help Assistance Device: Cane GaitEnergy manager) Signed: 09/11/2021 4:29:23 PM By: Gretta Cool, BSN, RN, CWS, Kim RN, BSN Entered By: Gretta Cool, BSN, RN, CWS, Kim on 09/10/2021 10:23:23 Morgan Daniels ([id number]) -------------------------------------------------------------------------------- Nutrition Risk Screening Details Patient Name: Morgan Daniels Date of Service: 09/10/2021 10:00 AM Medical Record Number: [id number] Patient Account Number: [id number] Date of Birth/Sex: 1960/02/24 (61 y.o. F) Treating RN: Cornell Barman Primary Care Ivon Roedel: Jonetta Osgood Other Clinician: Referring Pansey Pinheiro: Referral, Self Treating Emony Dormer/Extender: Tito Dine in Treatment: 0 Height (in): 67 Weight (lbs): 200 Body Mass Index (BMI): 31.3 Nutrition Risk Screening Items Score Screening NUTRITION RISK SCREEN: I have an illness or condition that made me change the kind and/or amount of food I eat 0 No I eat fewer than two meals per day 0 No I eat few fruits and vegetables, or milk products 0 No I have three or more drinks of beer, liquor or wine almost every day 0 No I have tooth or mouth problems that make it hard for me to eat 0 No I don't always have enough money to buy the food I need 0 No I eat alone most of the time 0 No I take three or more different prescribed or over-the-counter drugs a day 0 No Without wanting to, I have lost or gained 10 pounds in the last six months 0 No I am not always physically able to shop, cook and/or feed myself 0 No Nutrition Protocols Good Risk Protocol 0 No interventions needed Moderate Risk Protocol High Risk Proctocol Risk Level: Good Risk Score: 0 Electronic Signature(s) Signed: 09/11/2021 4:29:23 PM By: Gretta Cool, BSN, RN, CWS, Kim RN, BSN Entered By: Gretta Cool, BSN, RN, CWS, Kim on 09/10/2021 10:17:42

## 2021-09-18 ENCOUNTER — Emergency Department: Payer: Medicare Other

## 2021-09-18 ENCOUNTER — Observation Stay
Admission: EM | Admit: 2021-09-18 | Discharge: 2021-09-19 | Disposition: A | Discharge status: Home / Self Care | Payer: Medicare Other | Attending: Internal Medicine | Admitting: Internal Medicine

## 2021-09-18 ENCOUNTER — Other Ambulatory Visit: Payer: Self-pay

## 2021-09-18 ENCOUNTER — Encounter: Payer: Self-pay | Admitting: Emergency Medicine

## 2021-09-18 DIAGNOSIS — E041 Nontoxic single thyroid nodule: Secondary | ICD-10-CM | POA: Diagnosis not present

## 2021-09-18 DIAGNOSIS — Z91040 Latex allergy status: Secondary | ICD-10-CM | POA: Insufficient documentation

## 2021-09-18 DIAGNOSIS — Z9889 Other specified postprocedural states: Secondary | ICD-10-CM | POA: Insufficient documentation

## 2021-09-18 DIAGNOSIS — I639 Cerebral infarction, unspecified: Secondary | ICD-10-CM | POA: Diagnosis not present

## 2021-09-18 DIAGNOSIS — Z951 Presence of aortocoronary bypass graft: Secondary | ICD-10-CM | POA: Diagnosis not present

## 2021-09-18 DIAGNOSIS — I6501 Occlusion and stenosis of right vertebral artery: Secondary | ICD-10-CM | POA: Diagnosis not present

## 2021-09-18 DIAGNOSIS — I739 Peripheral vascular disease, unspecified: Secondary | ICD-10-CM

## 2021-09-18 DIAGNOSIS — R001 Bradycardia, unspecified: Secondary | ICD-10-CM | POA: Diagnosis not present

## 2021-09-18 DIAGNOSIS — R531 Weakness: Secondary | ICD-10-CM | POA: Diagnosis not present

## 2021-09-18 DIAGNOSIS — Z7982 Long term (current) use of aspirin: Secondary | ICD-10-CM | POA: Insufficient documentation

## 2021-09-18 DIAGNOSIS — I63233 Cerebral infarction due to unspecified occlusion or stenosis of bilateral carotid arteries: Secondary | ICD-10-CM | POA: Diagnosis not present

## 2021-09-18 DIAGNOSIS — E1165 Type 2 diabetes mellitus with hyperglycemia: Secondary | ICD-10-CM | POA: Diagnosis not present

## 2021-09-18 DIAGNOSIS — R739 Hyperglycemia, unspecified: Secondary | ICD-10-CM | POA: Diagnosis not present

## 2021-09-18 DIAGNOSIS — Z7902 Long term (current) use of antithrombotics/antiplatelets: Secondary | ICD-10-CM | POA: Insufficient documentation

## 2021-09-18 DIAGNOSIS — E7800 Pure hypercholesterolemia, unspecified: Secondary | ICD-10-CM | POA: Diagnosis present

## 2021-09-18 DIAGNOSIS — S199XXA Unspecified injury of neck, initial encounter: Secondary | ICD-10-CM | POA: Diagnosis not present

## 2021-09-18 DIAGNOSIS — R296 Repeated falls: Secondary | ICD-10-CM | POA: Diagnosis not present

## 2021-09-18 DIAGNOSIS — I2510 Atherosclerotic heart disease of native coronary artery without angina pectoris: Secondary | ICD-10-CM | POA: Insufficient documentation

## 2021-09-18 DIAGNOSIS — E1159 Type 2 diabetes mellitus with other circulatory complications: Secondary | ICD-10-CM | POA: Diagnosis present

## 2021-09-18 DIAGNOSIS — R42 Dizziness and giddiness: Secondary | ICD-10-CM | POA: Diagnosis not present

## 2021-09-18 DIAGNOSIS — Z8679 Personal history of other diseases of the circulatory system: Secondary | ICD-10-CM | POA: Diagnosis not present

## 2021-09-18 DIAGNOSIS — I10 Essential (primary) hypertension: Secondary | ICD-10-CM | POA: Insufficient documentation

## 2021-09-18 DIAGNOSIS — Z794 Long term (current) use of insulin: Secondary | ICD-10-CM | POA: Diagnosis not present

## 2021-09-18 DIAGNOSIS — Z87891 Personal history of nicotine dependence: Secondary | ICD-10-CM | POA: Diagnosis not present

## 2021-09-18 DIAGNOSIS — I672 Cerebral atherosclerosis: Secondary | ICD-10-CM | POA: Diagnosis not present

## 2021-09-18 DIAGNOSIS — M47812 Spondylosis without myelopathy or radiculopathy, cervical region: Secondary | ICD-10-CM | POA: Diagnosis not present

## 2021-09-18 DIAGNOSIS — R4182 Altered mental status, unspecified: Secondary | ICD-10-CM | POA: Diagnosis not present

## 2021-09-18 DIAGNOSIS — E782 Mixed hyperlipidemia: Secondary | ICD-10-CM

## 2021-09-18 DIAGNOSIS — I6340 Cerebral infarction due to embolism of unspecified cerebral artery: Secondary | ICD-10-CM

## 2021-09-18 DIAGNOSIS — Z79899 Other long term (current) drug therapy: Secondary | ICD-10-CM | POA: Insufficient documentation

## 2021-09-18 DIAGNOSIS — S91302A Unspecified open wound, left foot, initial encounter: Secondary | ICD-10-CM

## 2021-09-18 DIAGNOSIS — I959 Hypotension, unspecified: Secondary | ICD-10-CM | POA: Diagnosis not present

## 2021-09-18 LAB — URINALYSIS, ROUTINE W REFLEX MICROSCOPIC
Bilirubin Urine: NEGATIVE
Glucose, UA: >=500 mg/dL — AB
Hgb urine dipstick: NEGATIVE
Ketones, ur: NEGATIVE mg/dL
Leukocytes,Ua: NEGATIVE
Nitrite: NEGATIVE
Protein, ur: >=300 mg/dL — AB
Specific Gravity, Urine: 1.012 (ref 1.005–1.030)
pH: 6 (ref 5.0–8.0)

## 2021-09-18 LAB — GLUCOSE, CAPILLARY
Glucose-Capillary: 301 mg/dL — ABNORMAL HIGH (ref 70–99)
Glucose-Capillary: 182 mg/dL — ABNORMAL HIGH (ref 70–99)

## 2021-09-18 LAB — TROPONIN I (HIGH SENSITIVITY)
Troponin I (High Sensitivity): 9 ng/L (ref <18)
Troponin I (High Sensitivity): 8 ng/L (ref <18)

## 2021-09-18 LAB — URINE DRUG SCREEN, QUALITATIVE (ARMC ONLY)
Amphetamines, Ur Screen: NOT DETECTED
Barbiturates, Ur Screen: NOT DETECTED
Benzodiazepine, Ur Scrn: NOT DETECTED
Cannabinoid 50 Ng, Ur ~~LOC~~: NOT DETECTED
Cocaine Metabolite,Ur ~~LOC~~: NOT DETECTED
MDMA (Ecstasy)Ur Screen: NOT DETECTED
Methadone Scn, Ur: NOT DETECTED
Opiate, Ur Screen: NOT DETECTED
Phencyclidine (PCP) Ur S: NOT DETECTED
Tricyclic, Ur Screen: NOT DETECTED

## 2021-09-18 LAB — BASIC METABOLIC PANEL
Anion gap: 8 (ref 5–15)
BUN: 24 mg/dL — ABNORMAL HIGH (ref 6–20)
CO2: 23 mmol/L (ref 22–32)
Calcium: 8.9 mg/dL (ref 8.9–10.3)
Chloride: 102 mmol/L (ref 98–111)
Creatinine, Ser: 1.1 mg/dL — ABNORMAL HIGH (ref 0.44–1.00)
GFR, Estimated: 58 mL/min — ABNORMAL LOW (ref >60)
Glucose, Bld: 368 mg/dL — ABNORMAL HIGH (ref 70–99)
Potassium: 4.1 mmol/L (ref 3.5–5.1)
Sodium: 133 mmol/L — ABNORMAL LOW (ref 135–145)

## 2021-09-18 LAB — CBC
HCT: 33.6 % — ABNORMAL LOW (ref 36.0–46.0)
Hemoglobin: 10.7 g/dL — ABNORMAL LOW (ref 12.0–15.0)
MCH: 25.5 pg — ABNORMAL LOW (ref 26.0–34.0)
MCHC: 31.8 g/dL (ref 30.0–36.0)
MCV: 80.2 fL (ref 80.0–100.0)
Platelets: 396 10*3/uL (ref 150–400)
RBC: 4.19 MIL/uL (ref 3.87–5.11)
RDW: 13.8 % (ref 11.5–15.5)
WBC: 5.3 10*3/uL (ref 4.0–10.5)
nRBC: 0 % (ref 0.0–0.2)

## 2021-09-18 LAB — HEPATIC FUNCTION PANEL
ALT: 12 U/L (ref 0–44)
AST: 18 U/L (ref 15–41)
Albumin: 3.3 g/dL — ABNORMAL LOW (ref 3.5–5.0)
Alkaline Phosphatase: 77 U/L (ref 38–126)
Bilirubin, Direct: <0.1 mg/dL (ref 0.0–0.2)
Total Bilirubin: 0.3 mg/dL (ref 0.3–1.2)
Total Protein: 7.7 g/dL (ref 6.5–8.1)

## 2021-09-18 LAB — TSH: TSH: 2.09 u[IU]/mL (ref 0.350–4.500)

## 2021-09-18 LAB — T4, FREE: Free T4: 0.7 ng/dL (ref 0.61–1.12)

## 2021-09-18 LAB — CK: Total CK: 64 U/L (ref 38–234)

## 2021-09-18 MED ORDER — SODIUM CHLORIDE 0.9 % IV SOLN: INTRAVENOUS | Status: DC

## 2021-09-18 MED ORDER — ROSUVASTATIN CALCIUM 10 MG PO TABS: 10.0000 mg | ORAL_TABLET | Freq: Every day | ORAL | Status: DC

## 2021-09-18 MED ORDER — ACETAMINOPHEN 325 MG PO TABS: 650.0000 mg | ORAL_TABLET | ORAL | Status: DC | PRN

## 2021-09-18 MED ORDER — IOHEXOL 350 MG/ML SOLN
50.0000 mL | Freq: Once | INTRAVENOUS | Status: AC | PRN
  Administered 2021-09-18: 50 mL via INTRAVENOUS

## 2021-09-18 MED ORDER — ONDANSETRON HCL 4 MG/2ML IJ SOLN
4.0000 mg | Freq: Once | INTRAMUSCULAR | Status: AC
  Administered 2021-09-18: 4 mg via INTRAVENOUS
  Filled 2021-09-18: qty 2

## 2021-09-18 MED ORDER — METOPROLOL SUCCINATE ER 50 MG PO TB24
50.0000 mg | ORAL_TABLET | Freq: Every day | ORAL | Status: DC
  Administered 2021-09-19: 50 mg via ORAL
  Filled 2021-09-18: qty 1

## 2021-09-18 MED ORDER — ASPIRIN 81 MG PO CHEW
81.0000 mg | CHEWABLE_TABLET | Freq: Every day | ORAL | Status: DC
Start: 2021-09-19 — End: 2021-09-19
  Administered 2021-09-19: 81 mg via ORAL
  Filled 2021-09-18: qty 1

## 2021-09-18 MED ORDER — ACETAMINOPHEN 500 MG PO TABS
1000.0000 mg | ORAL_TABLET | Freq: Once | ORAL | Status: AC
  Administered 2021-09-18: 1000 mg via ORAL
  Filled 2021-09-18: qty 2

## 2021-09-18 MED ORDER — HYDRALAZINE HCL 20 MG/ML IJ SOLN
5.0000 mg | Freq: Four times a day (QID) | INTRAMUSCULAR | Status: DC | PRN
Start: 2021-09-18 — End: 2021-09-19
  Administered 2021-09-19: 5 mg via INTRAVENOUS
  Filled 2021-09-18: qty 1

## 2021-09-18 MED ORDER — INSULIN ASPART 100 UNIT/ML IJ SOLN
4.0000 [IU] | Freq: Three times a day (TID) | INTRAMUSCULAR | Status: DC
Start: 2021-09-18 — End: 2021-09-19
  Administered 2021-09-18 – 2021-09-19 (×4): 4 [IU] via SUBCUTANEOUS
  Filled 2021-09-18 (×3): qty 1

## 2021-09-18 MED ORDER — STROKE: EARLY STAGES OF RECOVERY BOOK
Freq: Once | Status: AC
Start: 2021-09-19 — End: 2021-09-19

## 2021-09-18 MED ORDER — ASPIRIN 325 MG PO TABS
325.0000 mg | ORAL_TABLET | Freq: Every day | ORAL | Status: DC
  Filled 2021-09-18: qty 1

## 2021-09-18 MED ORDER — CLOPIDOGREL BISULFATE 75 MG PO TABS
75.0000 mg | ORAL_TABLET | Freq: Every day | ORAL | Status: DC
  Administered 2021-09-18: 75 mg via ORAL
  Filled 2021-09-18: qty 1

## 2021-09-18 MED ORDER — MECLIZINE HCL 25 MG PO TABS
25.0000 mg | ORAL_TABLET | Freq: Once | ORAL | Status: AC
  Administered 2021-09-18: 25 mg via ORAL
  Filled 2021-09-18: qty 1

## 2021-09-18 MED ORDER — GABAPENTIN 600 MG PO TABS
600.0000 mg | ORAL_TABLET | Freq: Three times a day (TID) | ORAL | Status: DC
  Administered 2021-09-18 – 2021-09-19 (×3): 600 mg via ORAL
  Filled 2021-09-18 (×3): qty 1

## 2021-09-18 MED ORDER — ACETAMINOPHEN 160 MG/5ML PO SOLN: 650.0000 mg | ORAL | Status: DC | PRN

## 2021-09-18 MED ORDER — ASPIRIN 300 MG RE SUPP: 300.0000 mg | Freq: Every day | RECTAL | Status: DC

## 2021-09-18 MED ORDER — ROSUVASTATIN CALCIUM 20 MG PO TABS
40.0000 mg | ORAL_TABLET | Freq: Every day | ORAL | Status: DC
Start: 2021-09-18 — End: 2021-09-19
  Administered 2021-09-18: 40 mg via ORAL
  Filled 2021-09-18: qty 2

## 2021-09-18 MED ORDER — INSULIN ASPART 100 UNIT/ML IJ SOLN
0.0000 [IU] | Freq: Three times a day (TID) | INTRAMUSCULAR | Status: DC
  Administered 2021-09-18: 15 [IU] via SUBCUTANEOUS
  Administered 2021-09-19: 4 [IU] via SUBCUTANEOUS
  Administered 2021-09-19: 7 [IU] via SUBCUTANEOUS
  Administered 2021-09-19: 11 [IU] via SUBCUTANEOUS
  Filled 2021-09-18 (×4): qty 1

## 2021-09-18 MED ORDER — ACETAMINOPHEN 650 MG RE SUPP: 650.0000 mg | RECTAL | Status: DC | PRN

## 2021-09-18 MED ORDER — DAPAGLIFLOZIN PROPANEDIOL 10 MG PO TABS
10.0000 mg | ORAL_TABLET | Freq: Every day | ORAL | Status: DC
  Administered 2021-09-19: 10 mg via ORAL
  Filled 2021-09-18: qty 1

## 2021-09-18 MED ORDER — LACTATED RINGERS IV BOLUS
1000.0000 mL | Freq: Once | INTRAVENOUS | Status: AC
  Administered 2021-09-18: 1000 mL via INTRAVENOUS

## 2021-09-18 MED ORDER — VITAMIN D 25 MCG (1000 UNIT) PO TABS
5000.0000 [IU] | ORAL_TABLET | Freq: Every day | ORAL | Status: DC
  Administered 2021-09-19: 5000 [IU] via ORAL
  Filled 2021-09-18: qty 5

## 2021-09-18 MED ORDER — ENOXAPARIN SODIUM 40 MG/0.4ML IJ SOSY
40.0000 mg | PREFILLED_SYRINGE | INTRAMUSCULAR | Status: DC
  Administered 2021-09-18: 40 mg via SUBCUTANEOUS
  Filled 2021-09-18: qty 0.4

## 2021-09-18 MED ORDER — SENNOSIDES-DOCUSATE SODIUM 8.6-50 MG PO TABS: 1.0000 | ORAL_TABLET | Freq: Every evening | ORAL | Status: DC | PRN

## 2021-09-18 MED ORDER — GEMFIBROZIL 600 MG PO TABS
600.0000 mg | ORAL_TABLET | Freq: Two times a day (BID) | ORAL | Status: DC
  Administered 2021-09-19 (×2): 600 mg via ORAL
  Filled 2021-09-18 (×2): qty 1

## 2021-09-18 MED ORDER — TRAMADOL HCL 50 MG PO TABS
50.0000 mg | ORAL_TABLET | Freq: Three times a day (TID) | ORAL | Status: DC | PRN
  Administered 2021-09-18 – 2021-09-19 (×3): 50 mg via ORAL
  Filled 2021-09-18 (×3): qty 1

## 2021-09-18 MED ORDER — INSULIN GLARGINE-YFGN 100 UNIT/ML ~~LOC~~ SOLN
30.0000 [IU] | Freq: Two times a day (BID) | SUBCUTANEOUS | Status: DC
  Administered 2021-09-18 – 2021-09-19 (×2): 30 [IU] via SUBCUTANEOUS
  Filled 2021-09-18 (×3): qty 0.3

## 2021-09-18 MED ORDER — FLUOXETINE HCL 20 MG PO CAPS
80.0000 mg | ORAL_CAPSULE | Freq: Every day | ORAL | Status: DC
  Administered 2021-09-19: 80 mg via ORAL
  Filled 2021-09-18: qty 4

## 2021-09-18 MED ORDER — INSULIN ASPART 100 UNIT/ML IJ SOLN: 0.0000 [IU] | Freq: Every day | INTRAMUSCULAR | Status: DC

## 2021-09-18 NOTE — Assessment & Plan Note (Signed)
Patient with history of extensive peripheral arterial disease s/p bilateral endarterectomies and CABG. -Continue home aspirin and Plavix -Continue home statin

## 2021-09-18 NOTE — Plan of Care (Signed)

## 2021-09-18 NOTE — H&P (Signed)
History and Physical    Patient: Morgan Daniels JWJ:[id number] DOB: [birth year] DOA: 09/18/2021 DOS: the patient was seen and examined on 09/18/2021 PCP: Morgan Osgood, NP  Patient coming from: Home  Chief Complaint:  Chief Complaint  Patient presents with   Weakness   HPI: Morgan Daniels is a 61 y.o. female with medical history significant of CVA, diabetes mellitus, hypertension, peripheral arterial disease s/p bilateral carotid endarterectomies, CAD s/p CABG presented to ED with complaint of generalized weakness and lightheadedness since she woke up this morning.  Per patient she was at her normal self last night when she went to bed around 10 PM. Patient called EMS due to worsening dizziness and confusion, received 500 cc fluid due to softer blood pressure.  Patient denies any focal weakness.  No chest pain or shortness of breath.  No headaches or blurry vision.  Patient was quite unstable on her feet.  No recent illnesses, fever or chills.  No urinary symptoms.  ED course.  Hemodynamically stable.  Labs pertinent for creatinine of 1.10 with baseline less than 1, does not meet the criteria for AKI.  Initial CT head was negative for any acute abnormalities.  MRI brain was obtained when patient remained persistently imbalanced and dizzy and it shows multiple punctate acute infarcts involving bilateral frontal white matter and cerebellar hemisphere involving multiple vascular territories, concerning for embolic stroke. CTA head and neck with scattered mild plaques in the bilateral carotid system without hemodynamically significant stenosis or occlusion.  Patent vertebral arteries.  Significant calcified plaques noted in intracranial ICAs resulting in up to mild to moderate stenosis on the right and mild stenosis on the left.  TRH was consulted for admission as patient needed stroke work-up. Neurology was also consulted and they will see the patient tomorrow.  Review of Systems: As mentioned  in the history of present illness. All other systems reviewed and are negative. Past Medical History:  Diagnosis Date   Anxiety    Arthritis    Bilateral carotid artery stenosis 2014   Carotid artery occlusion    Chronic kidney disease 06/18/2015   UTI   Chronic kidney disease 2017   Current smoker    CVA (cerebral vascular accident) ([place]) 2013   Depression    Diabetes ([place])    Diverticulosis    Fatty liver    Fibromyalgia    GERD (gastroesophageal reflux disease)    H/O hiatal hernia    Heart attack ([place]) 01/12/2021   Hiatal hernia    Hypercholesteremia    Hypertension    IBS (irritable bowel syndrome)    PAD (peripheral artery disease) ([place])    Peptic ulcer    Plantar fasciitis    Stroke ([place]) 01/31/2012   Right side-ministroke   T2DM (type 2 diabetes mellitus) ([place])    Past Surgical History:  Procedure Laterality Date   ABDOMINAL HYSTERECTOMY  1990   BACK SURGERY  2000, 2004   CAROTID ENDARTERECTOMY Left 05/06/12   CARPAL TUNNEL RELEASE Left 07/18/2015   Procedure: CARPAL TUNNEL RELEASE;  Surgeon: Earnestine Leys, MD;  Location: [facility] OR[facility];  Service: Orthopedics;  Laterality: Left;   CEREBRAL ANGIOGRAM Bilateral 05/03/2012   Procedure: CEREBRAL ANGIOGRAM;  Surgeon: Angelia Mould, MD;  Location: [facility] CATH LAB;  Service: Cardiovascular;  Laterality: Bilateral;   CHOLECYSTECTOMY  2001   COLONOSCOPY WITH PROPOFOL N/A 11/19/2018   Procedure: COLONOSCOPY WITH PROPOFOL;  Surgeon: Lucilla Lame, MD;  Location: [place];  Service: Endoscopy;  Laterality: N/A;  Diabetic -  insulin   CORONARY ARTERY BYPASS GRAFT N/A 01/18/2021   Procedure: CORONARY ARTERY BYPASS GRAFTING (CABG) x 4  USING LEFT INTERNAL MAMMARY ARTERY AND LEFT ENDOSCOPIC GREATER SAPHENOUS VEIN CONDUITS;  Surgeon: [name], MD;  Location: [place];  Service: Open Heart Surgery;  Laterality: N/A;   CORONARY/GRAFT ACUTE MI REVASCULARIZATION N/A 01/13/2021   Procedure: Coronary/Graft Acute MI  Revascularization;  Surgeon: Isaias Cowman, MD;  Location: [place] CV LAB;  Service: Cardiovascular;  Laterality: N/A;   ENDARTERECTOMY Left 05/06/2012   Procedure: ENDARTERECTOMY CAROTID;  Surgeon: Angelia Mould, MD;  Location: [place];  Service: Vascular;  Laterality: Left;   ENDARTERECTOMY Right 08/09/2013   Procedure: ENDARTERECTOMY CAROTID-RIGHT;  Surgeon: Angelia Mould, MD;  Location: [place];  Service: Vascular;  Laterality: Right;   ENDOVEIN HARVEST OF GREATER SAPHENOUS VEIN Left 01/18/2021   Procedure: ENDOVEIN HARVEST OF GREATER SAPHENOUS VEIN;  Surgeon: [name], MD;  Location: [place];  Service: Open Heart Surgery;  Laterality: Left;   HERNIA REPAIR     IABP INSERTION Right 01/13/2021   Procedure: IABP Insertion;  Surgeon: Isaias Cowman, MD;  Location: [place] CV LAB;  Service: Cardiovascular;  Laterality: Right;   LEFT HEART CATH AND CORONARY ANGIOGRAPHY N/A 01/13/2021   Procedure: LEFT HEART CATH AND CORONARY ANGIOGRAPHY;  Surgeon: Isaias Cowman, MD;  Location: [place] CV LAB;  Service: Cardiovascular;  Laterality: N/A;   LOWER EXTREMITY ANGIOGRAPHY Left 01/03/2021   Procedure: LOWER EXTREMITY ANGIOGRAPHY;  Surgeon: Algernon Huxley, MD;  Location: [place] CV LAB;  Service: Cardiovascular;  Laterality: Left;   LOWER EXTREMITY ANGIOGRAPHY Left 02/28/2021   Procedure: LOWER EXTREMITY ANGIOGRAPHY;  Surgeon: Algernon Huxley, MD;  Location: [place] CV LAB;  Service: Cardiovascular;  Laterality: Left;   PATCH ANGIOPLASTY Left 05/06/2012   Procedure: WITH DACRON PATCH ANGIOPLASTY ;  Surgeon: Angelia Mould, MD;  Location: [place];  Service: Vascular;  Laterality: Left;   POLYPECTOMY  11/19/2018   Procedure: POLYPECTOMY;  Surgeon: Lucilla Lame, MD;  Location: [place];  Service: Endoscopy;;   SPINE SURGERY  2004   TEE WITHOUT CARDIOVERSION N/A 01/18/2021   Procedure: TRANSESOPHAGEAL ECHOCARDIOGRAM (TEE);  Surgeon:  [name], MD;  Location: [place];  Service: Open Heart Surgery;  Laterality: N/A;   TONSILLECTOMY     TUBAL LIGATION     Social History:  reports that she has quit smoking. Her smoking use included cigarettes. She started smoking about 50 years ago. She has a 40.00 pack-year smoking history. She has been exposed to tobacco smoke. She has never used smokeless tobacco. She reports that she does not currently use alcohol. She reports that she does not use drugs.  Allergies  Allergen Reactions   Simvastatin Diarrhea and Nausea And Vomiting   Morphine And Related Itching   Lactose Intolerance (Gi) Diarrhea    bloating   Latex Rash    Rash when she wears gloves (Negative by test - per pt)    Family History  Problem Relation Age of Onset   Hypertension Mother    Hyperlipidemia Mother    Deep vein thrombosis Mother    Cancer Father    Alcohol abuse Father    Heart failure Father    Hypertension Maternal Grandmother    Deep vein thrombosis Sister    Alcohol abuse Sister    Alcohol abuse Sister     Prior to Admission medications   Medication Sig Start Date End Date Taking? Authorizing Provider  amLODipine (NORVASC) 10 MG tablet Take 1 tablet (10 mg total) by mouth daily. 08/21/21   Morgan Osgood, NP  aspirin 81 MG chewable tablet Chew 1 tablet (81 mg total) by mouth daily. 01/31/21   Angiulli, Lavon Paganini, PA-C  atorvastatin (LIPITOR) 80 MG tablet Take 1 tablet (80 mg total) by mouth daily. 08/21/21   Morgan Osgood, NP  Cholecalciferol (VITAMIN D-3) 125 MCG (5000 UT) TABS Take 5,000 Units by mouth daily. 01/31/21   Angiulli, Lavon Paganini, PA-C  clopidogrel (PLAVIX) 75 MG tablet Take 1 tablet (75 mg total) by mouth daily. 01/31/21   Angiulli, Lavon Paganini, PA-C  Continuous Blood Gluc Receiver (FREESTYLE LIBRE 2 READER) DEVI As directed 05/29/20   Cassandria Anger, MD  Continuous Blood Gluc Sensor (FREESTYLE LIBRE 2 SENSOR) MISC USE TO TEST BLOOD GLUCOSE 4 TIMES A DAY . DX E11.65  08/28/21   Morgan Osgood, NP  dapagliflozin propanediol (FARXIGA) 10 MG TABS tablet Take 1 tablet (10 mg total) by mouth daily before breakfast. 08/21/21   Abernathy, Yetta Flock, NP  dicyclomine (BENTYL) 10 MG capsule TAKE 1 CAPSULE (10 MG TOTAL) BY MOUTH 3 (THREE) TIMES DAILY BEFORE MEALS. 03/18/21   Morgan Osgood, NP  FLUoxetine (PROZAC) 40 MG capsule Take 2 capsules (80 mg total) by mouth daily. 07/25/21   Morgan Osgood, NP  gabapentin (NEURONTIN) 600 MG tablet Take 1 tablet (600 mg total) by mouth in the morning, at noon, in the evening, and at bedtime. 08/21/21   Morgan Osgood, NP  gemfibrozil (LOPID) 600 MG tablet Take 1 tablet (600 mg total) by mouth 2 (two) times daily. 08/21/21   Morgan Osgood, NP  insulin aspart (NOVOLOG FLEXPEN) 100 UNIT/ML FlexPen Inject 20 Units into the skin 3 (three) times daily before meals. Needs appointment for further refills. 04/24/21   Cassandria Anger, MD  metoprolol succinate (TOPROL-XL) 50 MG 24 hr tablet Take 1 tablet (50 mg total) by mouth daily. 08/21/21   Morgan Osgood, NP  Multiple Vitamin (MULTIVITAMIN WITH MINERALS) TABS tablet Take 1 tablet by mouth daily. 02/01/21   Angiulli, Lavon Paganini, PA-C  mupirocin ointment (BACTROBAN) 2 % Apply 1 application. topically 2 (two) times daily. 05/22/21   Morgan Osgood, NP  nitroGLYCERIN (NITRODUR - DOSED IN MG/24 HR) 0.2 mg/hr patch Place 1 patch (0.2 mg total) onto the skin daily. 07/10/21 07/10/22  Raulkar, Clide Deutscher, MD  saccharomyces boulardii (FLORASTOR) 250 MG capsule Take 1 capsule (250 mg total) by mouth 2 (two) times daily. 01/31/21   Angiulli, Lavon Paganini, PA-C  tiZANidine (ZANAFLEX) 4 MG tablet TAKE 1 TABLET(S) BY MOUTH TWICE A DAY AS NEEDED FOR MUSCLE PAIN AND SPASMS 07/03/21   Morgan Osgood, NP  traMADol (ULTRAM) 50 MG tablet Take 1 tablet (50 mg total) by mouth 3 (three) times daily as needed for moderate pain or severe pain. 08/27/21   Morgan Osgood, NP  TRESIBA FLEXTOUCH 200 UNIT/ML  FlexTouch Pen INJECT 100 UNITS INTO THE SKIN AT BEDTIME. 09/01/21   Abernathy, Yetta Flock, NP  ULTICARE MICRO PEN NEEDLES 32G X 4 MM MISC TO USE WITH INSULIN DOSING THREE TIMES DAILY PRIOR TO MEALS AND AT BEDTIME FOR BASAL INSULIN. 12/24/20   [provider]  umeclidinium-vilanterol (ANORO ELLIPTA) 62.5-25 MCG/ACT AEPB Inhale 1 puff into the lungs daily. 01/31/21   Cathlyn Parsons, PA-C    Physical Exam: Vitals:   09/18/21 1300 09/18/21 1500 09/18/21 1621 09/18/21 1738  BP: (!) 158/81 (!) 150/77 (!) 167/92 (!) 182/82  Pulse: 66 65 85 69  Resp: 17 18 (!) 22 17  Temp: 98.3 F (36.8 C) 98.2 F (36.8 C) 98.1 F (36.7 C) 98.2 F (36.8 C)  TempSrc: Oral  Oral   SpO2: 98% 98% 90% 96%  Weight:      Height:        General: Vital signs reviewed.  Patient is well-developed and well-nourished, in no acute distress and cooperative with exam.  Head: Normocephalic and atraumatic. Eyes: EOMI, conjunctivae normal, no scleral icterus.  Neck: Supple, trachea midline, normal ROM,  Cardiovascular: RRR, S1 normal, S2 normal, no murmurs, gallops, or rubs. Pulmonary/Chest: Clear to auscultation bilaterally, no wheezes, rales, or rhonchi. Abdominal: Soft, non-tender, non-distended, BS +,  Extremities: No lower extremity edema bilaterally,  pulses symmetric and intact bilaterally.  Neurological: A&O x3, Strength is normal and symmetric bilaterally, cranial nerve II-XII are grossly intact, no focal motor deficit, sensory intact to light touch bilaterally.  Psychiatric: Normal mood and affect.   Data Reviewed: Prior data reviewed.  Assessment and Plan: * Stroke [facility]) MRI positive for multiple punctate acute infarcts involving bilateral frontal white matter and bilateral cerebellar hemisphere.  Concern of embolic stroke involving multiple vascular territories.  No history of A-fib but patient do have significant plaque involving ICAs which can be the source. -Admit to telemetry under observation to  complete stroke work-up. -Neurology consult -CTA of head and neck -Lipid panel -A1c -Echocardiogram with bubble studies -PT/OT evaluation -Aspirin and statin -Patient might need anticoagulation. -Neurology will see the patient tomorrow morning  HYPERCHOLESTEROLEMIA - Continue home high-dose Lipitor and gemfibrozil  Essential hypertension Holding home amlodipine for permissive hypertension. -Continue with home metoprolol -As needed hydralazine for systolic above 009  Type 2 diabetes mellitus with hyperglycemia, with long-term current use of insulin (HCC) On Farxiga, Tresiba 100 units at bedtime and NovoLog 20 units 3 times a day with meals. -Check A1c. -Continue Farxiga -Semglee 30 units twice daily -4 units of NovoLog with meal -SSI  PAD (peripheral artery disease) ([place]) Patient with history of extensive peripheral arterial disease s/p bilateral endarterectomies and CABG. -Continue home aspirin and Plavix -Continue home statin  Coronary artery disease S/p CABG. no chest pain. Troponin remain negative. -Continue home aspirin, metoprolol and statin  Advance Care Planning:   Code Status: Full Code confirmed with patient  Consults: Neurology  Family Communication: Discussed with patient  Severity of Illness: The appropriate patient status for this patient is OBSERVATION. Observation status is judged to be reasonable and necessary in order to provide the required intensity of service to ensure the patient's safety. The patient's presenting symptoms, physical exam findings, and initial radiographic and laboratory data in the context of their medical condition is felt to place them at decreased risk for further clinical deterioration. Furthermore, it is anticipated that the patient will be medically stable for discharge from the hospital within 2 midnights of admission.   This record has been created using Systems analyst. Errors have been sought and  corrected,but may not always be located. Such creation errors do not reflect on the standard of care.   Author: Lorella Nimrod, MD 09/18/2021 6:00 PM  For on call review [email or web address].

## 2021-09-18 NOTE — Discharge Instructions (Signed)
Take your blood pressure twice a day and keep a log.  If BP still elevated in 2 days then can go up on the losartan to '50mg'$  daily

## 2021-09-18 NOTE — Assessment & Plan Note (Signed)
Holding home amlodipine for permissive hypertension. -Continue with home metoprolol -As needed hydralazine for systolic above 136

## 2021-09-18 NOTE — ED Notes (Signed)
First Nurse Note: Pt to ED via [facility] from home for generalized weakness and dizziness since this morning. Pt has hx/o DM, MI. Pt denies CP or ShOB. Pt CBG 388. 12 Lead EKG WNL per EMS  20 G IV LFA Pt given 500 CC of fluid in route Bp: 109/51

## 2021-09-18 NOTE — Assessment & Plan Note (Signed)
MRI positive for multiple punctate acute infarcts involving bilateral frontal white matter and bilateral cerebellar hemisphere.  Concern of embolic stroke involving multiple vascular territories.  No history of A-fib but patient do have significant plaque involving ICAs which can be the source. -Admit to telemetry under observation to complete stroke work-up. -Neurology consult -CTA of head and neck -Lipid panel -A1c -Echocardiogram with bubble studies -PT/OT evaluation -Aspirin and statin -Patient might need anticoagulation. -Neurology will see the patient tomorrow morning

## 2021-09-18 NOTE — ED Triage Notes (Signed)
Pt via EMS from home. Pt c/o weakness since this morning. Pt state she feels lightheaded. Denies headache, states this is what usually happens when she hypertensive. States that she just feels really sleepy. Denies any recent head trauma Pt is A&OX4 and NAD

## 2021-09-18 NOTE — Assessment & Plan Note (Signed)
On Farxiga, Tresiba 100 units at bedtime and NovoLog 20 units 3 times a day with meals. -Check A1c. -Continue Farxiga -Semglee 30 units twice daily -4 units of NovoLog with meal -SSI

## 2021-09-18 NOTE — Consult Note (Incomplete)
Neurology Consultation *** To be seen 8/3 Reason for Consult: *** Requesting Physician: ***  CC: ***  History is obtained from:***  HPI: Morgan Daniels is a 61 y.o. female ***   LKW: *** tPA given?: No, or if yes, time given *** IA performed?: No, or if yes, groin puncture time: *** Premorbid modified rankin scale: ***     0 - No symptoms.     1 - No significant disability. Able to carry out all usual activities, despite some symptoms.     2 - Slight disability. Able to look after own affairs without assistance, but unable to carry out all previous activities.     3 - Moderate disability. Requires some help, but able to walk unassisted.     4 - Moderately severe disability. Unable to attend to own bodily needs without assistance, and unable to walk unassisted.     5 - Severe disability. Requires constant nursing care and attention, bedridden, incontinent.     6 - Dead. ICH Score: ***  Time performed: *** GCS: {Blank single:19197::"3-4 is 2 points","5-12 is 1 point","13-15 is 0 points"} Infratentorial: {yes no:314532}. If yes, 1 point Volume: {Blank single:19197::">30cc is 1 point","<30cc is 0 points"}  Age: 61 y.o.. >80 is 1 point Intraventricular extension is 1 point  Score:***  A Score of {Blank single:19197::"0 points has a 30 day mortality of 0%","1 points has a 30 day mortality of 13%","2 points has a 30 day mortality of 26%","3 points has a 30 day mortality of 72%","4 points has a 30 day mortality of 97%","5-6 points has a 30 day mortality of 100%"}. Stroke. 2001 Apr;32(4):891-7.    ROS: All other review of systems was negative except as noted in the HPI. *** Unable to obtain due to altered mental status.   Past Medical History:  Diagnosis Date   Anxiety    Arthritis    Bilateral carotid artery stenosis 2014   Carotid artery occlusion    Chronic kidney disease 06/18/2015   UTI   Chronic kidney disease 2017   Current smoker    CVA (cerebral vascular accident)  ([place]) 2013   Depression    Diabetes ([place])    Diverticulosis    Fatty liver    Fibromyalgia    GERD (gastroesophageal reflux disease)    H/O hiatal hernia    Heart attack ([place]) 01/12/2021   Hiatal hernia    Hypercholesteremia    Hypertension    IBS (irritable bowel syndrome)    PAD (peripheral artery disease) (HCC)    Peptic ulcer    Plantar fasciitis    Stroke ([place]) 01/31/2012   Right side-ministroke   T2DM (type 2 diabetes mellitus) (HCC)    ***  Family History  Problem Relation Age of Onset   Hypertension Mother    Hyperlipidemia Mother    Deep vein thrombosis Mother    Cancer Father    Alcohol abuse Father    Heart failure Father    Hypertension Maternal Grandmother    Deep vein thrombosis Sister    Alcohol abuse Sister    Alcohol abuse Sister    ***  Social History:  reports that she has quit smoking. Her smoking use included cigarettes. She started smoking about 50 years ago. She has a 40.00 pack-year smoking history. She has been exposed to tobacco smoke. She has never used smokeless tobacco. She reports that she does not currently use alcohol. She reports that she does not use drugs. ***  Exam: Current vital  signs: BP (!) 167/92 (BP Location: Right Arm)   Pulse 85   Temp 98.1 F (36.7 C) (Oral)   Resp (!) 22   Ht '5\' 7"'$  (1.702 m)   Wt 86.2 kg   SpO2 90%   BMI 29.76 kg/m  Vital signs in last 24 hours: Temp:  [97.8 F (36.6 C)-98.8 F (37.1 C)] 98.1 F (36.7 C) (08/02 1621) Pulse Rate:  [59-85] 85 (08/02 1621) Resp:  [17-22] 22 (08/02 1621) BP: (115-167)/(63-92) 167/92 (08/02 1621) SpO2:  [90 %-98 %] 90 % (08/02 1621) Weight:  [86.2 kg] 86.2 kg (08/02 0843)   Physical Exam  Constitutional: Appears well-developed and well-nourished.  Psych: Affect appropriate to situation, *** Eyes: No scleral injection HENT: No oropharyngeal obstruction.  MSK: no joint deformities.  Cardiovascular: Normal rate and regular rhythm. *** Perfusing extremities  well Respiratory: Effort normal, non-labored breathing GI: Soft.  No distension. There is no tenderness.  Skin: Warm dry and intact visible skin  Neuro: Mental Status: Patient is awake, alert, oriented to person, place, month, year, and situation.*** Patient is able to give a clear and coherent history.*** No signs of aphasia or neglect*** Cranial Nerves: II: Visual Fields are full. Pupils are equal, round, and reactive to light.  *** III,IV, VI: EOMI without ptosis or diploplia.  V: Facial sensation is symmetric to temperature VII: Facial movement is symmetric.  VIII: hearing is intact to voice X: Uvula elevates symmetrically XI: Shoulder shrug is symmetric. XII: tongue is midline without atrophy or fasciculations.  Motor: Tone is normal. Bulk is normal. 5/5 strength was present in all four extremities. *** Sensory: Sensation is symmetric to light touch and temperature in the arms and legs.*** Deep Tendon Reflexes: 2+ and symmetric in the brachioradialis and patellae. *** Plantars: Toes are downgoing bilaterally. *** Cerebellar: FNF and HKS are intact bilaterally*** Gait:  Deferred in acute setting ***  NIHSS total *** Score breakdown: *** Performed at *** time of patient arrival to ED    I have reviewed labs in epic and the results pertinent to this consultation are:  Basic Metabolic Panel: Recent Labs  Lab 09/18/21 0852  NA 133*  K 4.1  CL 102  CO2 23  GLUCOSE 368*  BUN 24*  CREATININE 1.10*  CALCIUM 8.9    CBC: Recent Labs  Lab 09/18/21 0852  WBC 5.3  HGB 10.7*  HCT 33.6*  MCV 80.2  PLT 396    Coagulation Studies: No results for input(s): "LABPROT", "INR" in the last 72 hours.   Lab Results  Component Value Date   HGBA1C 8.8 (H) 01/14/2021    Lab Results  Component Value Date   CHOL 138 01/16/2021   HDL 30 (L) 01/16/2021   LDLCALC 71 01/16/2021   LDLDIRECT 55.5 01/15/2021   TRIG 184 (H) 01/16/2021   CHOLHDL 4.6 01/16/2021     I  have reviewed the images obtained:  CTA personally reviewed, agree with radiology:   1. No acute intracranial pathology on the noncontrast head CT. 2. Scattered mild plaque in the bilateral carotid systems without hemodynamically significant stenosis or occlusion. Patent vertebral arteries in the neck. 3. Calcified plaque in the intracranial ICAs resulting in up to mild-to-moderate stenosis on the right and mild stenosis on the left. Mild atherosclerotic irregularity of the distal intracranial vasculature without proximal high-grade stenosis or occlusion.  MRI brain personally reviewed, agree with radiology:   1. Multiple punctate acute infarcts in bilateral frontal white matter as well as bilateral cerebellar hemispheres. Consider  embolic etiology given involvement of multiple vascular territories. 2. Chronic microvascular ischemic disease and remote lacunar infarcts.  ECHO 11/22  1. Left ventricular ejection fraction, by estimation, is 60 to 65%. The  left ventricle has normal function. The left ventricle has no regional  wall motion abnormalities. Left ventricular diastolic parameters are  consistent with Grade I diastolic  dysfunction (impaired relaxation).   2. Right ventricular systolic function is normal. The right ventricular  size is normal. Tricuspid regurgitation signal is inadequate for assessing  PA pressure.   3. The mitral valve is normal in structure. Mild mitral valve  regurgitation. No evidence of mitral stenosis.   4. The aortic valve is normal in structure. Aortic valve regurgitation is  not visualized. Aortic valve sclerosis/calcification is present, without  any evidence of aortic stenosis.   5. The inferior vena cava is dilated in size with >50% respiratory  variability, suggesting right atrial pressure of 8 mmHg.  Current ECHO w/ bubble study pending  Impression: ***  Recommendations: - ***   Lesleigh Noe MD-PhD [facility] [phone]   *** [facility], [facility], [facility]    Total critical care time: *** minutes   Critical care time was exclusive of separately billable procedures and treating other patients.   Critical care was necessary to treat or prevent imminent or life-threatening deterioration.   Critical care was time spent personally by me on the following activities: development of treatment plan with patient and/or surrogate as well as nursing, discussions with consultants/primary team, evaluation of patient's response to treatment, examination of patient, obtaining history from patient or surrogate, ordering and performing treatments and interventions, ordering and review of laboratory studies, ordering and review of radiographic studies, and re-evaluation of patient's condition as needed, as documented above.

## 2021-09-18 NOTE — Assessment & Plan Note (Signed)
S/p CABG. no chest pain. Troponin remain negative. -Continue home aspirin, metoprolol and statin

## 2021-09-18 NOTE — ED Provider Notes (Addendum)
[facility] Provider Note    Event Date/Time   First MD Initiated Contact with Patient 09/18/21 1020     (approximate)   History   Weakness   HPI  Morgan Daniels is a 61 y.o. female who comes in for feeling weakness since this morning and lightheadedness.  Patient reports feeling sleepy.  Patient got 500 cc of fluid with EMS.  Patient reports that she went to bed last night around 10 PM felt her normal self.  She however woke up this morning around 5 AM just not feeling her normal self having a little bit of headache.  She reports that her husband was at work and she started to feel worse with headache and dizziness and confusion therefore she called EMS because she did not want to be alone.  She denies any chest pain, shortness of breath, abdominal pain, urinary symptoms or other concerns.  She does have a history of strokes, CABG and is on a baby aspirin only.  She denies any weakness to 1 side.  She reports that she initially thought it could be from her sugars being low and so she ate a bunch of candy bars but had no improvement in symptoms.  She was told by EMS that her blood pressure was a little bit low so patient was given 500 cc of fluid on route.  Patient reports that is maybe slightly improved now but still having continued symptoms.  Patient does report having some falls recently but is unable to tell me when the last fall was.  Physical Exam   Triage Vital Signs: ED Triage Vitals  Enc Vitals Group     BP 09/18/21 0845 115/63     Pulse Rate 09/18/21 0845 (!) 59     Resp 09/18/21 0845 18     Temp 09/18/21 0845 97.8 F (36.6 C)     Temp Source 09/18/21 0845 Oral     SpO2 09/18/21 0845 94 %     Weight 09/18/21 0843 190 lb (86.2 kg)     Height 09/18/21 0843 '5\' 7"'$  (1.702 m)     Head Circumference --      Peak Flow --      Pain Score 09/18/21 0843 0     Pain Loc --      Pain Edu? --      Excl. in [place]? --     Most recent vital signs: Vitals:    09/18/21 0845 09/18/21 1020  BP: 115/63 118/71  Pulse: (!) 59 60  Resp: 18 18  Temp: 97.8 F (36.6 C) 98.5 F (36.9 C)  SpO2: 94% 98%     General: Awake, no distress.  CV:  Good peripheral perfusion.  Resp:  Normal effort.  Abd:  No distention.  Soft nontender Other:  CN Nerves II through XII are intact.  Equal strength in arms and legs.  Vision intact peripherally.  Finger-nose intact. TM clear bilaterally.  Diabetic foot wound noted on the left heel without any erythema or redness surrounding it or any drainage.  Patient reports that this is being monitored by her doctor and they do not feel like it is actively infected.  ED Results / Procedures / Treatments   Labs (all labs ordered are listed, but only abnormal results are displayed) Labs Reviewed  BASIC METABOLIC PANEL - Abnormal; Notable for the following components:      Result Value   Sodium 133 (*)    Glucose, Bld 368 (*)  BUN 24 (*)    Creatinine, Ser 1.10 (*)    GFR, Estimated 58 (*)    All other components within normal limits  CBC - Abnormal; Notable for the following components:   Hemoglobin 10.7 (*)    HCT 33.6 (*)    MCH 25.5 (*)    All other components within normal limits  URINALYSIS, ROUTINE W REFLEX MICROSCOPIC  CBG MONITORING, ED     EKG  My interpretation of EKG:  Sinus bradycardia rate of 59 without any ST elevation, T wave inversion in V2 and aVL, normal intervals  IMPRESSION: 1. No acute intracranial pathology on the noncontrast head CT. 2. Scattered mild plaque in the bilateral carotid systems without hemodynamically significant stenosis or occlusion. Patent vertebral arteries in the neck. 3. Calcified plaque in the intracranial ICAs resulting in up to mild-to-moderate stenosis on the right and mild stenosis on the left. Mild atherosclerotic irregularity of the distal intracranial vasculature without proximal high-grade stenosis or occlusion.      RADIOLOGY I have reviewed the CT  head personally interpreted and do not see any evidence of intercranial hemorrhage PROCEDURES:  Critical Care performed: No  .1-3 Lead EKG Interpretation  Performed by: Vanessa [place], MD Authorized by: Vanessa , MD     Interpretation: normal     ECG rate:  60   ECG rate assessment: normal     Rhythm: sinus rhythm     Ectopy: none     Conduction: normal      MEDICATIONS ORDERED IN ED: Medications  lactated ringers bolus 1,000 mL (1,000 mLs Intravenous New Bag/Given 09/18/21 1044)  ondansetron (ZOFRAN) injection 4 mg (4 mg Intravenous Given 09/18/21 1044)  acetaminophen (TYLENOL) tablet 1,000 mg (1,000 mg Oral Given 09/18/21 1045)     IMPRESSION / MDM / ASSESSMENT AND PLAN / ED COURSE  I reviewed the triage vital signs and the nursing notes.   Patient's presentation is most consistent with acute presentation with potential threat to life or bodily function.   Patient comes in with some dizziness, headaches and some confusion.  Patient has a hard time explaining to me what she feels and seems somewhat confused and is reporting some falls.  Given her significant history of strokes and MI I am concerned about the possibility of stroke, dissection therefore will get a CTA to further evaluate.  This could just be from elevated sugars and we will give some fluids.  Given last known normal patient is out of the window for stroke code and she has a low NIH stroke scale so do not feel like she is an LVO candidate patient does have a foot wound on examination but I do not see any signs of active infection.   CBC shows slightly low hemoglobin but around patient's baseline.  White count is normal.  BMP shows glucose of 368 but anion gap is normal no evidence of DKA.  Creatinine is slightly elevated.  CTA overall reassuring to some incidentally noted plaque and stenosis.  We will provide a copy of the report to patient for follow-up outpatient  12:32 PM reevaluated patient and she seemed a  little wobbly with trying to ambulate.  Patient reports that she still does not feel at her baseline self although her nausea is improved her walking is not as good as it usually is.  Therefore will get MRI to make sure no evidence of posterior stroke  Patient will be handed off to oncoming team pending MRI and glucose  check.   The patient is on the cardiac monitor to evaluate for evidence of arrhythmia and/or significant heart rate changes.      FINAL CLINICAL IMPRESSION(S) / ED DIAGNOSES   Final diagnoses:  Hyperglycemia  Dizziness     Rx / DC Orders   ED Discharge Orders     None        Note:  This document was prepared using Dragon voice recognition software and may include unintentional dictation errors.   Vanessa [place], MD 09/18/21 1439    Vanessa [place], MD 09/18/21 [phone]

## 2021-09-18 NOTE — Assessment & Plan Note (Addendum)
-   Continue home high-dose Lipitor and gemfibrozil

## 2021-09-19 ENCOUNTER — Ambulatory Visit: Payer: Medicare Other | Admitting: Nurse Practitioner

## 2021-09-19 ENCOUNTER — Other Ambulatory Visit: Payer: Self-pay | Admitting: Nurse Practitioner

## 2021-09-19 ENCOUNTER — Observation Stay
Admit: 2021-09-19 | Discharge: 2021-09-19 | Disposition: A | Discharge status: Home / Self Care | Payer: Medicare Other | Attending: Neurology | Admitting: Neurology

## 2021-09-19 DIAGNOSIS — I739 Peripheral vascular disease, unspecified: Secondary | ICD-10-CM

## 2021-09-19 DIAGNOSIS — S91302A Unspecified open wound, left foot, initial encounter: Secondary | ICD-10-CM | POA: Diagnosis not present

## 2021-09-19 DIAGNOSIS — I6340 Cerebral infarction due to embolism of unspecified cerebral artery: Secondary | ICD-10-CM | POA: Diagnosis not present

## 2021-09-19 DIAGNOSIS — Z794 Long term (current) use of insulin: Secondary | ICD-10-CM

## 2021-09-19 DIAGNOSIS — K580 Irritable bowel syndrome with diarrhea: Secondary | ICD-10-CM

## 2021-09-19 DIAGNOSIS — E782 Mixed hyperlipidemia: Secondary | ICD-10-CM

## 2021-09-19 DIAGNOSIS — E1165 Type 2 diabetes mellitus with hyperglycemia: Secondary | ICD-10-CM | POA: Diagnosis not present

## 2021-09-19 DIAGNOSIS — I10 Essential (primary) hypertension: Secondary | ICD-10-CM | POA: Diagnosis not present

## 2021-09-19 LAB — LIPID PANEL
Cholesterol: 445 mg/dL — ABNORMAL HIGH (ref 0–200)
HDL: 37 mg/dL — ABNORMAL LOW (ref >40)
LDL Cholesterol: UNDETERMINED mg/dL (ref 0–99)
Total CHOL/HDL Ratio: 12 RATIO
Triglycerides: 836 mg/dL — ABNORMAL HIGH (ref <150)
VLDL: UNDETERMINED mg/dL (ref 0–40)

## 2021-09-19 LAB — ECHOCARDIOGRAM COMPLETE BUBBLE STUDY
AR max vel: 2.01 cm2
AV Area VTI: 2.07 cm2
AV Area mean vel: 2.02 cm2
AV Mean grad: 7 mmHg
AV Peak grad: 11.4 mmHg
Ao pk vel: 1.69 m/s
Area-P 1/2: 8.52 cm2
S' Lateral: 2.81 cm

## 2021-09-19 LAB — LDL CHOLESTEROL, DIRECT: Direct LDL: 262 mg/dL — ABNORMAL HIGH (ref 0–99)

## 2021-09-19 LAB — GLUCOSE, CAPILLARY
Glucose-Capillary: 223 mg/dL — ABNORMAL HIGH (ref 70–99)
Glucose-Capillary: 251 mg/dL — ABNORMAL HIGH (ref 70–99)
Glucose-Capillary: 186 mg/dL — ABNORMAL HIGH (ref 70–99)

## 2021-09-19 LAB — HEMOGLOBIN A1C
Hgb A1c MFr Bld: 9.7 % — ABNORMAL HIGH (ref 4.8–5.6)
Mean Plasma Glucose: 231.69 mg/dL

## 2021-09-19 MED ORDER — CLOPIDOGREL BISULFATE 75 MG PO TABS: 75.0000 mg | ORAL_TABLET | Freq: Every day | ORAL | 0 refills | Status: AC

## 2021-09-19 MED ORDER — CLOPIDOGREL BISULFATE 75 MG PO TABS
300.0000 mg | ORAL_TABLET | Freq: Once | ORAL | Status: AC
  Administered 2021-09-19: 300 mg via ORAL
  Filled 2021-09-19: qty 4

## 2021-09-19 MED ORDER — CLOPIDOGREL BISULFATE 75 MG PO TABS: 75.0000 mg | ORAL_TABLET | Freq: Every day | ORAL | Status: DC

## 2021-09-19 MED ORDER — LOSARTAN POTASSIUM 25 MG PO TABS: 25.0000 mg | ORAL_TABLET | Freq: Every day | ORAL | 0 refills | Status: DC

## 2021-09-19 MED ORDER — AMLODIPINE BESYLATE 10 MG PO TABS
10.0000 mg | ORAL_TABLET | Freq: Every day | ORAL | Status: DC
  Administered 2021-09-19: 10 mg via ORAL
  Filled 2021-09-19: qty 1

## 2021-09-19 MED ORDER — COLLAGENASE 250 UNIT/GM EX OINT: TOPICAL_OINTMENT | Freq: Every day | CUTANEOUS | 0 refills | Status: DC

## 2021-09-19 MED ORDER — LOSARTAN POTASSIUM 25 MG PO TABS
25.0000 mg | ORAL_TABLET | Freq: Every day | ORAL | Status: DC
  Administered 2021-09-19: 25 mg via ORAL
  Filled 2021-09-19: qty 1

## 2021-09-19 MED ORDER — ROSUVASTATIN CALCIUM 40 MG PO TABS: 40.0000 mg | ORAL_TABLET | Freq: Every day | ORAL | 0 refills | Status: DC

## 2021-09-19 MED ORDER — COLLAGENASE 250 UNIT/GM EX OINT
TOPICAL_OINTMENT | Freq: Every day | CUTANEOUS | Status: DC
  Filled 2021-09-19: qty 30

## 2021-09-19 NOTE — Care Management (Signed)
  Transition of Care [facility]) Screening Note   Patient Details  Name: Morgan Daniels Date of Birth: 04-Apr-1960   Transition of Care [facility]) CM/SW Contact:    Pete Pelt, RN Phone Number: 09/19/2021, 4:18 PM    Transition of Care Department [facility]) has reviewed patient and no TOC needs have been identified at this time. We will continue to monitor patient advancement through interdisciplinary progression rounds. If new patient transition needs arise, please place a TOC consult.

## 2021-09-19 NOTE — Progress Notes (Addendum)
Dr. Leslye Peer notified of elevated bp, 190/92. Per MD will add losartan and recheck bp in one hr. Will continue to monitor.

## 2021-09-19 NOTE — Progress Notes (Signed)
Morgan Daniels to be D/C'd Home per MD order.  Discussed prescriptions and follow up appointments with the patient and husband.  Prescriptions given to patient, medication list explained in detail. Pt verbalized understanding.  Allergies as of 09/19/2021       Reactions   Simvastatin Diarrhea, Nausea And Vomiting   Morphine And Related Itching   Lactose Intolerance (gi) Diarrhea   bloating   Latex Rash   Rash when she wears gloves (Negative by test - per pt)        Medication List     STOP taking these medications    multivitamin with minerals Tabs tablet   mupirocin ointment 2 % Commonly known as: BACTROBAN   saccharomyces boulardii 250 MG capsule Commonly known as: FLORASTOR   umeclidinium-vilanterol 62.5-25 MCG/ACT Aepb Commonly known as: ANORO ELLIPTA       TAKE these medications    amLODipine 10 MG tablet Commonly known as: NORVASC Take 1 tablet (10 mg total) by mouth daily.   aspirin 81 MG chewable tablet Chew 1 tablet (81 mg total) by mouth daily.   clopidogrel 75 MG tablet Commonly known as: Plavix Take 1 tablet (75 mg total) by mouth daily for 21 days.   collagenase 250 UNIT/GM ointment Commonly known as: SANTYL Apply topically daily. Start taking on: September 20, 2021   dapagliflozin propanediol 10 MG Tabs tablet Commonly known as: FARXIGA Take 1 tablet (10 mg total) by mouth daily before breakfast.   dicyclomine 10 MG capsule Commonly known as: BENTYL TAKE 1 CAPSULE (10 MG TOTAL) BY MOUTH 3 (THREE) TIMES DAILY BEFORE MEALS.   FLUoxetine 40 MG capsule Commonly known as: PROZAC Take 2 capsules (80 mg total) by mouth daily.   FreeStyle Libre 2 Reader Kerrin Mo As directed   [employer] 2 Sensor Misc USE TO TEST BLOOD GLUCOSE 4 TIMES A DAY . DX E11.65   gabapentin 600 MG tablet Commonly known as: NEURONTIN Take 1 tablet (600 mg total) by mouth in the morning, at noon, in the evening, and at bedtime.   gemfibrozil 600 MG tablet Commonly known  as: LOPID Take 1 tablet (600 mg total) by mouth 2 (two) times daily.   losartan 25 MG tablet Commonly known as: COZAAR Take 1 tablet (25 mg total) by mouth daily. Start taking on: September 20, 2021   metoprolol succinate 50 MG 24 hr tablet Commonly known as: TOPROL-XL Take 1 tablet (50 mg total) by mouth daily.   nitroGLYCERIN 0.2 mg/hr patch Commonly known as: NITRODUR - Dosed in mg/24 hr Place 1 patch (0.2 mg total) onto the skin daily.   NovoLOG FlexPen 100 UNIT/ML FlexPen Generic drug: insulin aspart Inject 20 Units into the skin 3 (three) times daily before meals. Needs appointment for further refills. What changed:  how much to take additional instructions   rosuvastatin 40 MG tablet Commonly known as: CRESTOR Take 1 tablet (40 mg total) by mouth daily. What changed:  medication strength how much to take   tiZANidine 4 MG tablet Commonly known as: ZANAFLEX TAKE 1 TABLET(S) BY MOUTH TWICE A DAY AS NEEDED FOR MUSCLE PAIN AND SPASMS   traMADol 50 MG tablet Commonly known as: ULTRAM Take 1 tablet (50 mg total) by mouth 3 (three) times daily as needed for moderate pain or severe pain.   Tyler Aas FlexTouch 200 UNIT/ML FlexTouch Pen Generic drug: insulin degludec INJECT 100 UNITS INTO THE SKIN AT BEDTIME.   UltiCare Micro Pen Needles 32G X 4 MM Misc Generic drug:  Insulin Pen Needle TO USE WITH INSULIN DOSING THREE TIMES DAILY PRIOR TO MEALS AND AT BEDTIME FOR BASAL INSULIN.   Vitamin D-3 125 MCG (5000 UT) Tabs Take 5,000 Units by mouth daily.        Vitals:   09/19/21 1600 09/19/21 1603  BP: (!) 177/90 (!) 182/80  Pulse: 75 76  Resp:  18  Temp:  98.4 F (36.9 C)  SpO2:  94%    Tele box removed and returned. Skin clean, dry and intact without evidence of skin break down, no evidence of skin tears noted. IV catheter discontinued intact. Site without signs and symptoms of complications. Dressing and pressure applied. Pt denies pain at this time. No complaints  noted.  An After Visit Summary was printed and given to the patient. Patient escorted via [place], and D/C home via private auto.  Rolley Sims

## 2021-09-19 NOTE — Progress Notes (Signed)
PT Cancellation Note  Patient Details Name: Morgan Daniels MRN: [id number] DOB: 1960-08-21   Cancelled Treatment:    Reason Eval/Treat Not Completed: PT screened, no needs identified, will sign off. Orders received and chart reviewed. Upon entry pt ambulating in room with IV pole adjusting bed. Pt reports despite fatigue, she is at her baseline with mobility. Declines need for PT at this time stating she thinks she can manage her stairs to enter her home. No questions or concerns at this time for PT with regards to her mobility. PT to sign off as pt appears at baseline function.    Salem Caster. Fairly IV, PT, DPT Physical Therapist- [facility]  09/19/2021, 10:54 AM

## 2021-09-19 NOTE — Plan of Care (Signed)
  Problem: Education: Goal: Ability to describe self-care measures that may prevent or decrease complications (Diabetes Survival Skills Education) will improve Outcome: Progressing   Problem: Education: Goal: Knowledge of secondary prevention will improve (SELECT ALL) Outcome: Progressing Goal: Knowledge of patient specific risk factors will improve (INDIVIDUALIZE FOR PATIENT) Outcome: Progressing Goal: Individualized Educational Video(s) Outcome: Progressing

## 2021-09-19 NOTE — Inpatient Diabetes Management (Addendum)
Inpatient Diabetes Program Recommendations  AACE/ADA: New Consensus Statement on Inpatient Glycemic Control   Target Ranges:  Prepandial:   less than 140 mg/dL      Peak postprandial:   less than 180 mg/dL (1-2 hours)      Critically ill patients:  140 - 180 mg/dL    Latest Reference Range & Units 09/18/21 17:45 09/18/21 20:32 09/19/21 07:55  Glucose-Capillary 70 - 99 mg/dL 301 (H) 182 (H) 223 (H)    Latest Reference Range & Units 01/14/21 01:53 09/18/21 17:40  Hemoglobin A1C 4.8 - 5.6 % 8.8 (H) 9.7 (H)   Review of Glycemic Control  Diabetes history: DM2 Outpatient Diabetes medications: Tresiba 100 units QHS, Novolog 21 units TID with meals, Farxiga 10 mg daily Current orders for Inpatient glycemic control: Semglee 30 units BID, Novolog 0-20 units TID with meals, Novolog 0-5 units QHS, Novolog 4 units TID with meals, Farxiga 10 mg daily  Inpatient Diabetes Program Recommendations:    HbgA1C:  A1C 9.7% on 09/18/21 indicating an average glucose of 232 mg/dl over the past 2-3 months.  NOTE: Patient admitted with CVA, hypercholesterolemia, hypertension, and initial glucose 368 mg/dl on 09/18/21. In reviewing chart, noted patient was seeing Dr. Dorris Fetch (Endocrinologist in [place]) and was last seen 10/29/20. Per office note on 10/29/20, patient was advised to increase Tresiba from 80 to 100 units QHS, continue Novolog 20 units TID with meals, plus use Novolog correction scale. Noted patient seen her PCP Jonetta Osgood, NP on 08/21/21 and per office note, patient wanted to find a new Endocrinologist in [place], was continued on Tresiba 100 units QHS, Novolog 20 units TID with meals, and Farxiga 10 mg daily was added at that visit.   Addendum 09/19/21'@12'$ :30-Spoke with patient at bedside about diabetes and home regimen for diabetes control. Patient reports being followed by PCP for diabetes management and currently taking Tresiba 100 units QHS, Novolog 5-25 units TID with meals, and Farxiga 10 mg daily  as an outpatient for diabetes control. Patient reports taking DM medications as prescribed and confirms that she was recently prescribed Farxiga on 08/21/21. Patient is using [employer] Libre2 CGM for glucose monitoring and reports that glucose is usually in the 200's mg/dl in the morning and in the mid 100's mg/dl during the day. Patient states she has not really seen much difference in glucose trends since starting the Farxiga. Patient is giving her insulin injections into her abdomen. No hardened areas noted in abdomen and patient denies any insulin leaking out after she administers insulin.  Inquired about prior A1C and patient reports last A1C was in 9% range. Discussed A1C results (9.7% on 09/18/21) and explained that current A1C indicates an average glucose of 232 mg/dl over the past 2-3 months. Discussed glucose and A1C goals. Discussed importance of checking CBGs and maintaining good CBG control to prevent long-term and short-term complications. Explained how hyperglycemia leads to damage within blood vessels which lead to the common complications seen with uncontrolled diabetes. Stressed to the patient the importance of improving glycemic control to prevent further complications from uncontrolled diabetes. Patient confirms that her PCP recently made a referral to local Endocrinologist but she has not received a call about it yet. Encouraged patient to reach back out to her PCP to ask about the Endocrinology referral so she can get an appointment set up in the near future. Patient states she has all needed DM medications and supplies at home. Patient verbalized understanding of information discussed and reports no further questions at this  time related to diabetes.  Thanks, Barnie Alderman, RN, MSN, [place] [facility] Coordinator Inpatient Diabetes Program [phone] (Team Pager from 8am to [place])

## 2021-09-19 NOTE — Progress Notes (Signed)
*  PRELIMINARY RESULTS* Echocardiogram 2D Echocardiogram has been performed.  Morgan Daniels 09/19/2021, 10:51 AM

## 2021-09-19 NOTE — Discharge Summary (Signed)
Physician Discharge Summary   Patient: Morgan Daniels MRN: [id number] DOB: [birth year]  Admit date:     09/18/2021  Discharge date: 09/19/21  Discharge Physician: Loletha Grayer   PCP: Jonetta Osgood, NP   Recommendations at discharge:   Follow-up PCP 5 days Follow-up cardiology 1 week for consideration of heart monitor Check your blood pressure twice a day and keep a log for your PCP  Discharge Diagnoses: Principal Problem:   Stroke [facility]) Active Problems:   HYPERCHOLESTEROLEMIA   Essential hypertension   Type 2 diabetes mellitus with hyperglycemia, with long-term current use of insulin (HCC)   PAD (peripheral artery disease) ([place])   Coronary artery disease   Dizziness    Hospital Course: The patient was brought into the hospital on 09/18/2021 with weakness.  MRI of the brain showed multiple punctate acute infarcts in bilateral frontal white matter as well as bilateral cerebellar hemispheres.  Echocardiogram with bubble study was negative.  CT angio of the head and neck showed scattered mild plaque bilateral carotid systems without hemodynamically significant stenosis or occlusion.  Patent vertebral arteries within the neck.  Calcified plaque in the intracranial ICAs resulting up to mild to moderate stenosis of the right and mild stenosis on the left.  Patient was seen by neurology and recommended aspirin, increased dose of Lipitor and Plavix for 21 days.  Heart monitor recommended to rule out atrial fibrillation.  Assessment and Plan: * Stroke [facility]) MRI positive for multiple punctate acute infarcts involving bilateral frontal white matter and bilateral cerebellar hemisphere.  Concern of embolic stroke involving multiple vascular territories.  Echocardiogram was negative.  Will need outpatient monitor to see if the patient has atrial fibrillation.  Patient was walking in the room when physical therapy came by and patient declined physical therapy.  HYPERCHOLESTEROLEMIA - Continue  home high-dose Lipitor and gemfibrozil -LDL could not be calculated with triglycerides of 836.  Essential hypertension Continue home Norvasc and Toprol.  Add low-dose losartan.  Patient's blood pressure systolic is in the 245 range.  Patient just wanted to go home.  She does have a blood pressure cuff at home.  I asked her to take her blood pressure twice a day and keep a log for PCP.  If blood pressure still is high in 2 days can increase losartan to 50 mg  Type 2 diabetes mellitus with hyperglycemia, with long-term current use of insulin (HCC) On Farxiga, Tresiba 100 units at bedtime and NovoLog 20 units 3 times a day with meals. -Hemoglobin A1c elevated 9.7 -Continue Farxiga   PAD (peripheral artery disease) ([place]) Patient with history of extensive peripheral arterial disease s/p bilateral endarterectomies and CABG. -Continue home aspirin and increased dose of statin.  Coronary artery disease S/p CABG. no chest pain. Troponin remain negative. -Continue home aspirin, metoprolol and increased dose of statin  Nonhealing left heel wound.  Follow-up at the wound care center.  Patient seen by our wound care nurse and marked the area of 1.2 x 1.5 cm x 0.1 cm.  Continue Santyl.          Consultants: neurology Procedures performed: None Disposition: Home Diet recommendation:  Cardiac and Carb modified diet DISCHARGE MEDICATION: Allergies as of 09/19/2021       Reactions   Simvastatin Diarrhea, Nausea And Vomiting   Morphine And Related Itching   Lactose Intolerance (gi) Diarrhea   bloating   Latex Rash   Rash when she wears gloves (Negative by test - per pt)  Medication List     STOP taking these medications    multivitamin with minerals Tabs tablet   mupirocin ointment 2 % Commonly known as: BACTROBAN   saccharomyces boulardii 250 MG capsule Commonly known as: FLORASTOR   umeclidinium-vilanterol 62.5-25 MCG/ACT Aepb Commonly known as: ANORO ELLIPTA        TAKE these medications    amLODipine 10 MG tablet Commonly known as: NORVASC Take 1 tablet (10 mg total) by mouth daily.   aspirin 81 MG chewable tablet Chew 1 tablet (81 mg total) by mouth daily.   clopidogrel 75 MG tablet Commonly known as: Plavix Take 1 tablet (75 mg total) by mouth daily for 21 days.   collagenase 250 UNIT/GM ointment Commonly known as: SANTYL Apply topically daily. Start taking on: September 20, 2021   dapagliflozin propanediol 10 MG Tabs tablet Commonly known as: FARXIGA Take 1 tablet (10 mg total) by mouth daily before breakfast.   dicyclomine 10 MG capsule Commonly known as: BENTYL TAKE 1 CAPSULE (10 MG TOTAL) BY MOUTH 3 (THREE) TIMES DAILY BEFORE MEALS.   FLUoxetine 40 MG capsule Commonly known as: PROZAC Take 2 capsules (80 mg total) by mouth daily.   FreeStyle Libre 2 Reader Kerrin Mo As directed   [employer] 2 Sensor Misc USE TO TEST BLOOD GLUCOSE 4 TIMES A DAY . DX E11.65   gabapentin 600 MG tablet Commonly known as: NEURONTIN Take 1 tablet (600 mg total) by mouth in the morning, at noon, in the evening, and at bedtime.   gemfibrozil 600 MG tablet Commonly known as: LOPID Take 1 tablet (600 mg total) by mouth 2 (two) times daily.   losartan 25 MG tablet Commonly known as: COZAAR Take 1 tablet (25 mg total) by mouth daily. Start taking on: September 20, 2021   metoprolol succinate 50 MG 24 hr tablet Commonly known as: TOPROL-XL Take 1 tablet (50 mg total) by mouth daily.   nitroGLYCERIN 0.2 mg/hr patch Commonly known as: NITRODUR - Dosed in mg/24 hr Place 1 patch (0.2 mg total) onto the skin daily.   NovoLOG FlexPen 100 UNIT/ML FlexPen Generic drug: insulin aspart Inject 20 Units into the skin 3 (three) times daily before meals. Needs appointment for further refills. What changed:  how much to take additional instructions   rosuvastatin 40 MG tablet Commonly known as: CRESTOR Take 1 tablet (40 mg total) by mouth daily. What  changed:  medication strength how much to take   tiZANidine 4 MG tablet Commonly known as: ZANAFLEX TAKE 1 TABLET(S) BY MOUTH TWICE A DAY AS NEEDED FOR MUSCLE PAIN AND SPASMS   traMADol 50 MG tablet Commonly known as: ULTRAM Take 1 tablet (50 mg total) by mouth 3 (three) times daily as needed for moderate pain or severe pain.   Tyler Aas FlexTouch 200 UNIT/ML FlexTouch Pen Generic drug: insulin degludec INJECT 100 UNITS INTO THE SKIN AT BEDTIME.   UltiCare Micro Pen Needles 32G X 4 MM Misc Generic drug: Insulin Pen Needle TO USE WITH INSULIN DOSING THREE TIMES DAILY PRIOR TO MEALS AND AT BEDTIME FOR BASAL INSULIN.   Vitamin D-3 125 MCG (5000 UT) Tabs Take 5,000 Units by mouth daily.        Follow-up Information     Paraschos, Alexander, MD. Go in 1 week(s).   Specialty: Cardiology Why: discussion about long term implantable heart monitor Contact information: [facility] [place] [address] [phone]         Jonetta Osgood, NP Follow  up in 5 day(s).   Specialty: Nurse Practitioner Contact information: [place] [address] [phone]                Discharge Exam: Filed Weights   09/18/21 0843  Weight: 86.2 kg   Physical Exam HENT:     Head: Normocephalic.     Mouth/Throat:     Pharynx: No oropharyngeal exudate.  Eyes:     General: Lids are normal.     Conjunctiva/sclera: Conjunctivae normal.  Cardiovascular:     Rate and Rhythm: Normal rate and regular rhythm.     Heart sounds: Normal heart sounds, S1 normal and S2 normal.  Pulmonary:     Breath sounds: No decreased breath sounds, wheezing, rhonchi or rales.  Abdominal:     Palpations: Abdomen is soft.     Tenderness: There is no abdominal tenderness.  Musculoskeletal:     Right lower leg: No swelling.     Left lower leg: No swelling.  Skin:    General: Skin is warm.     Findings: No rash.  Neurological:     Mental  Status: She is alert and oriented to person, place, and time.     Comments: Power 5 out of 5 bilateral upper and lower extremities      Condition at discharge: stable  The results of significant diagnostics from this hospitalization (including imaging, microbiology, ancillary and laboratory) are listed below for reference.   Imaging Studies: ECHOCARDIOGRAM COMPLETE BUBBLE STUDY  Result Date: 09/19/2021    ECHOCARDIOGRAM REPORT   Patient Name:   Morgan Daniels Date of Exam: 09/19/2021 Medical Rec #:  [id number]      Height:       67.0 in Accession #:    [id number]     Weight:       190.0 lb Date of Birth:  Nov 29, 1960      BSA:          1.979 m Patient Age:    2 years       BP:           186/93 mmHg Patient Gender: F              HR:           85 bpm. Exam Location:  [facility] Procedure: 2D Echo, Color Doppler, Cardiac Doppler and Saline Contrast Bubble            Study Indications:     I63.9 Stroke  History:         Patient has prior history of Echocardiogram examinations, most                  recent 01/18/2021. Previous Myocardial Infarction, Prior CABG,                  CKD, PAD and Stroke; Risk Factors:Current Smoker, Hypertension,                  Diabetes and HCL.  Sonographer:     Charmayne Sheer Referring Phys:  [id number] Lorenza Chick Diagnosing Phys: Yolonda Kida MD IMPRESSIONS  1. Negativebubble study.  2. Left ventricular ejection fraction, by estimation, is 65 to 70%. The left ventricle has normal function. The left ventricle has no regional wall motion abnormalities. There is mild concentric left ventricular hypertrophy. Left ventricular diastolic parameters are consistent with Grade I diastolic dysfunction (impaired relaxation).  3. Right ventricular systolic function is normal. The right ventricular size  is normal.  4. The mitral valve is normal in structure. No evidence of mitral valve regurgitation.  5. The aortic valve is normal in structure. Aortic valve regurgitation is not visualized. Aortic  valve sclerosis/calcification is present, without any evidence of aortic stenosis. FINDINGS  Left Ventricle: Left ventricular ejection fraction, by estimation, is 65 to 70%. The left ventricle has normal function. The left ventricle has no regional wall motion abnormalities. The left ventricular internal cavity size was normal in size. There is  mild concentric left ventricular hypertrophy. Left ventricular diastolic parameters are consistent with Grade I diastolic dysfunction (impaired relaxation). Right Ventricle: The right ventricular size is normal. No increase in right ventricular wall thickness. Right ventricular systolic function is normal. Left Atrium: Left atrial size was normal in size. Right Atrium: Right atrial size was normal in size. Pericardium: There is no evidence of pericardial effusion. Mitral Valve: The mitral valve is normal in structure. No evidence of mitral valve regurgitation. Tricuspid Valve: The tricuspid valve is grossly normal. Tricuspid valve regurgitation is mild. Aortic Valve: The aortic valve is normal in structure. Aortic valve regurgitation is not visualized. Aortic valve sclerosis/calcification is present, without any evidence of aortic stenosis. Aortic valve mean gradient measures 7.0 mmHg. Aortic valve peak  gradient measures 11.4 mmHg. Aortic valve area, by VTI measures 2.07 cm. Pulmonic Valve: The pulmonic valve was normal in structure. Pulmonic valve regurgitation is not visualized. Aorta: The ascending aorta was not well visualized. IAS/Shunts: No atrial level shunt detected by color flow Doppler. Agitated saline contrast was given intravenously to evaluate for intracardiac shunting. Additional Comments: Negativebubble study.  LEFT VENTRICLE PLAX 2D LVIDd:         4.39 cm   Diastology LVIDs:         2.81 cm   LV e' medial:    8.05 cm/s LV PW:         1.68 cm   LV E/e' medial:  8.1 LV IVS:        1.00 cm   LV e' lateral:   8.81 cm/s LVOT diam:     2.10 cm   LV E/e' lateral:  7.4 LV SV:         60 LV SV Index:   30 LVOT Area:     3.46 cm  RIGHT VENTRICLE RV Basal diam:  3.57 cm LEFT ATRIUM             Index        RIGHT ATRIUM           Index LA diam:        3.90 cm 1.97 cm/m   RA Area:     11.00 cm LA Vol (A2C):   44.6 ml 22.54 ml/m  RA Volume:   24.50 ml  12.38 ml/m LA Vol (A4C):   41.6 ml 21.02 ml/m LA Biplane Vol: 43.1 ml 21.78 ml/m  AORTIC VALVE                     PULMONIC VALVE AV Area (Vmax):    2.01 cm      PV Vmax:       1.08 m/s AV Area (Vmean):   2.02 cm      PV Peak grad:  4.7 mmHg AV Area (VTI):     2.07 cm AV Vmax:           169.00 cm/s AV Vmean:          122.000 cm/s AV  VTI:            0.290 m AV Peak Grad:      11.4 mmHg AV Mean Grad:      7.0 mmHg LVOT Vmax:         98.30 cm/s LVOT Vmean:        71.000 cm/s LVOT VTI:          0.173 m LVOT/AV VTI ratio: 0.60  AORTA Ao Root diam: 3.70 cm MITRAL VALVE MV Area (PHT): 8.52 cm     SHUNTS MV Decel Time: 89 msec      Systemic VTI:  0.17 m MV E velocity: 65.60 cm/s   Systemic Diam: 2.10 cm MV A velocity: 113.00 cm/s MV E/A ratio:  0.58 Dwayne D Callwood MD Electronically signed by Yolonda Kida MD Signature Date/Time: 09/19/2021/11:18:06 AM    Final    MR BRAIN WO CONTRAST  Result Date: 09/18/2021 CLINICAL DATA:  Mental status change, unknown cause EXAM: MRI HEAD WITHOUT CONTRAST TECHNIQUE: Multiplanar, multiecho pulse sequences of the brain and surrounding structures were obtained without intravenous contrast. COMPARISON:  CTA head/neck from the same day. FINDINGS: Brain: Multiple punctate acute infarcts in bilateral frontal white matter as well as bilateral cerebellar hemispheres. Cysts no significant edema or mass effect. Patchy and confluent T2/FLAIR hyperintensities within the white matter, nonspecific but compatible with chronic microvascular ischemic disease. Small remote lacunar infarct in the left basal ganglia and bilateral corona radiata. No evidence of acute hemorrhage, mass lesion, midline shift, or  hydrocephalus. Vascular: Major arterial flow voids are maintained at the skull base. Skull and upper cervical spine: Normal marrow signal.  Cysts Sinuses/Orbits: Clear sinuses.  No acute orbital findings. Other: No mastoid effusions. IMPRESSION: 1. Multiple punctate acute infarcts in bilateral frontal white matter as well as bilateral cerebellar hemispheres. Consider embolic etiology given involvement of multiple vascular territories. 2. Chronic microvascular ischemic disease and remote lacunar infarcts. Electronically Signed   By: Margaretha Sheffield M.D.   On: 09/18/2021 15:52   CT C-SPINE NO CHARGE  Result Date: 09/18/2021 CLINICAL DATA:  Trauma. EXAM: CT CERVICAL SPINE WITHOUT CONTRAST TECHNIQUE: Multidetector CT imaging of the cervical spine was performed without intravenous contrast. Multiplanar CT image reconstructions were also generated. RADIATION DOSE REDUCTION: This exam was performed according to the departmental dose-optimization program which includes automated exposure control, adjustment of the mA and/or kV according to patient size and/or use of iterative reconstruction technique. COMPARISON:  None Available. FINDINGS: Alignment: There is straightening of the normal cervical lordosis. There is no antero or retrolisthesis. There is no jumped or perched facet or other evidence of traumatic malalignment. Skull base and vertebrae: Skull base alignment is maintained. Vertebral body heights are preserved. There is no evidence of acute fracture. Soft tissues and spinal canal: No prevertebral fluid or swelling. No visible canal hematoma. Disc levels: There is overall mild disc space narrowing degenerative endplate change. Facet arthropathy is most advanced on the left at C4-C5 where there is moderate to severe neural foraminal stenosis. There is no evidence of significant spinal canal stenosis. Upper chest: The imaged lung apices are clear. Other: None. IMPRESSION: No acute fracture or traumatic  malalignment of the cervical spine. Electronically Signed   By: Valetta Mole M.D.   On: 09/18/2021 11:51   CT ANGIO HEAD NECK W WO CM  Result Date: 09/18/2021 CLINICAL DATA:  TIA EXAM: CT ANGIOGRAPHY HEAD AND NECK TECHNIQUE: Multidetector CT imaging of the head and neck was performed using the standard protocol  during bolus administration of intravenous contrast. Multiplanar CT image reconstructions and MIPs were obtained to evaluate the vascular anatomy. Carotid stenosis measurements (when applicable) are obtained utilizing NASCET criteria, using the distal internal carotid diameter as the denominator. RADIATION DOSE REDUCTION: This exam was performed according to the departmental dose-optimization program which includes automated exposure control, adjustment of the mA and/or kV according to patient size and/or use of iterative reconstruction technique. CONTRAST:  56m OMNIPAQUE IOHEXOL 350 MG/ML SOLN COMPARISON:  CT head 01/12/2021, brain MRI 02/03/2019 FINDINGS: CT HEAD FINDINGS Brain: There is no acute intracranial hemorrhage, extra-axial fluid collection, or acute infarct Background parenchymal volume is normal. The ventricles are stable in size. Small remote lacunar infarcts in the bilateral basal ganglia and right corona radiata are unchanged with slight ex vacuo dilatation of the body of the right lateral ventricle. Additional patchy hypodensity in the subcortical and periventricular white matter likely reflects sequela of underlying chronic white matter microangiopathy. There is no mass lesion.  There is no mass effect or midline shift. Vascular: See below. Skull: Normal. Negative for fracture or focal lesion. Sinuses/Orbits: The imaged paranasal sinuses are clear. The globes and orbits are unremarkable. Other: None. Review of the MIP images confirms the above findings CTA NECK FINDINGS Aortic arch: There is calcified atherosclerotic plaque in the aortic arch. The origins of the major branch vessels are  patent. The subclavian arteries are patent to the level imaged. Right carotid system: The right common, internal, and external carotid arteries are patent with soft and scattered calcified plaque but without hemodynamically significant stenosis or occlusion. There is no dissection or aneurysm. Left carotid system: Left common, internal, and external carotid arteries are patent with scattered soft and calcified plaque but without hemodynamically significant stenosis or occlusion. There is no dissection or aneurysm. Vertebral arteries: The vertebral arteries are patent without hemodynamically significant stenosis or occlusion. There is no dissection or aneurysm. Skeleton: There is no acute osseous abnormality or suspicious osseous lesion. There is no visible canal hematoma. Other neck: Median sternotomy wires are noted. There is a subcentimeter right thyroid nodule for which no specific imaging follow-up is required. The soft tissues of the neck are otherwise unremarkable. Upper chest: The imaged lung apices are clear. Review of the MIP images confirms the above findings CTA HEAD FINDINGS Anterior circulation: There is calcified plaque in the intracranial ICAs resulting in up to mild to moderate stenosis of the right ophthalmic segment and no greater than mild stenosis on the left. The bilateral MCAs are patent with mild atherosclerotic irregularity distally but no proximal high-grade stenosis or occlusion. The bilateral ACAs are patent with mild atherosclerotic irregularity distally but no proximal high-grade stenosis or occlusion. The anterior communicating artery is normal There is no aneurysm or AVM. Posterior circulation: There is calcified plaque at the right V3/V4 junction resulting in mild stenosis. The left V4 segment is diminutive after the PICA origin, likely a developmental variant. Basilar artery is patent. The major cerebellar artery origins are patent The bilateral PCAs are patent. The left posterior  communicating artery is identified. There is no right posterior communicating artery. There is no aneurysm or AVM. Venous sinuses: Suboptimally assessed due to bolus timing. Anatomic variants: As above. Review of the MIP images confirms the above findings IMPRESSION: 1. No acute intracranial pathology on the noncontrast head CT. 2. Scattered mild plaque in the bilateral carotid systems without hemodynamically significant stenosis or occlusion. Patent vertebral arteries in the neck. 3. Calcified plaque in the intracranial ICAs resulting  in up to mild-to-moderate stenosis on the right and mild stenosis on the left. Mild atherosclerotic irregularity of the distal intracranial vasculature without proximal high-grade stenosis or occlusion. Electronically Signed   By: Valetta Mole M.D.   On: 09/18/2021 11:46       Labs: CBC: Recent Labs  Lab 09/18/21 0852  WBC 5.3  HGB 10.7*  HCT 33.6*  MCV 80.2  PLT 409   Basic Metabolic Panel: Recent Labs  Lab 09/18/21 0852  NA 133*  K 4.1  CL 102  CO2 23  GLUCOSE 368*  BUN 24*  CREATININE 1.10*  CALCIUM 8.9   Liver Function Tests: Recent Labs  Lab 09/18/21 0852  AST 18  ALT 12  ALKPHOS 77  BILITOT 0.3  PROT 7.7  ALBUMIN 3.3*   CBG: Recent Labs  Lab 09/18/21 1745 09/18/21 2032 09/19/21 0755 09/19/21 1138 09/19/21 1605  GLUCAP 301* 182* 223* 251* 186*    Discharge time spent: greater than 30 minutes.  Signed: Loletha Grayer, MD [facility] 09/19/2021

## 2021-09-19 NOTE — Progress Notes (Signed)
OT Cancellation Note  Patient Details Name: Morgan Daniels MRN: [id number] DOB: [birth year]   Cancelled Treatment:    Reason Eval/Treat Not Completed: OT screened, no needs identified, will sign off. Order received, chart reviewed. PT reports pt up ambulating to bathroom ad lib, no assist. Pt reports back to baseline functional independence. No skilled OT needs identified. Will sign off. Please re-consult if additional needs arise.   Dessie Coma, M.S. OTR/L  09/19/21, 10:53 AM  ascom [phone]

## 2021-09-19 NOTE — Plan of Care (Signed)

## 2021-09-19 NOTE — Consult Note (Signed)
[place] Nurse Consult Note: Reason for Consult:Nonhealing wound of the left heel.  Patient is followed for this wound by the outpatient wound care center and last saw Dr. Laurene Footman in that setting last week on 7/25/.23.  I will continue Dr. Janalyn Rouse POC. Wound type:Non pressure full thickness wound in patient with DM, PAD. Of note, there is a contralateral, mirror image change in skin integrity noted by Dr. Dellia Nims that presents with no wound, but only dry tissue. of the right heel  Pressure Injury POA:N/A Measurement:Per Dr. Dellia Nims on 7/25: 1.2cm x 1.,5cm x 0.1cm Wound bed: >50% nonviable tissue (eschar) Drainage (amount, consistency, odor) None Periwound:intact Dressing procedure/placement/frequency:I will continue the POC from the outpatient wound care center and order collagenase (Santyl) ointment to be applied once daily after cleansing, topped with a NS gauze 2x2, topped with a dry gauze 2x2 and secured with Kerlix roll gauze/paper tape.  Heels are to be floated while in house and placement of a prophylactic foam dressing is Ordered.  Patient to follow up with Dr. Dellia Nims at the outpatient wound care center as directed.  [place] nursing team will not follow, but will remain available to this patient, the nursing and medical teams.  Please re-consult if needed.  Thank you for inviting [place] to participate in this patient's Plan of Care.  Maudie Flakes, MSN, RN, CNS, [place], Serita Grammes, [employer], [employer] phone:  [phone]

## 2021-09-19 NOTE — Evaluation (Signed)
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   Encephalopathy acute    Acute respiratory failure with hypoxia and hypercapnia ([place]) 01/13/2021   NSTEMI (non-ST elevated myocardial infarction) ([place]) 01/13/2021   Hypercholesteremia 01/13/2021   Tobacco abuse 01/13/2021   Cardiogenic shock ([place]) 01/13/2021   Encounter for screening mammogram for malignant neoplasm of breast 01/03/2019   Polyp of sigmoid colon    Irritable bowel syndrome with diarrhea 12/30/2017   Uncontrolled type 2 diabetes mellitus with hyperglycemia ([place]) 09/29/2017   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   Depression, major, single episode, moderate ([place]) 09/29/2017   Encounter for general adult medical examination with abnormal findings 06/11/2017   Type 2 diabetes mellitus with hyperglycemia, with long-term current use of insulin ([place]) 06/11/2017   Vitamin D deficiency 06/11/2017   Mixed hyperlipidemia 06/11/2017   Acquired hypothyroidism 06/11/2017   Essential hypertension 03/02/2017   Impingement syndrome of shoulder region 01/24/2016   Carotid stenosis 08/09/2013   Carotid artery stenosis with cerebral infarction ([place]) 08/03/2013   Aftercare following surgery of the circulatory system, NEC 12/08/2012   Occlusion and stenosis of carotid artery without mention of cerebral infarction 04/28/2012   HYPERCHOLESTEROLEMIA 06/01/2006   PANIC ATTACK 06/01/2006   TOBACCO ABUSE 06/01/2006   DEPRESSION 06/01/2006   ALLERGIC RHINITIS 06/01/2006   GERD 06/01/2006   DIVERTICULAR DISEASE 06/01/2006   MIGRAINES, HX OF  06/01/2006   Past Medical History:  Past Medical History:  Diagnosis Date   Anxiety    Arthritis    Bilateral carotid artery stenosis 2014   Carotid artery occlusion    Chronic kidney disease 06/18/2015   UTI   Chronic kidney disease 2017   Current smoker    CVA (cerebral vascular accident) ([place]) 2013   Depression    Diabetes ([place])    Diverticulosis    Fatty liver    Fibromyalgia    GERD (gastroesophageal reflux disease)    H/O hiatal hernia    Heart attack ([place]) 01/12/2021   Hiatal hernia    Hypercholesteremia    Hypertension    IBS (irritable bowel syndrome)    PAD (peripheral artery disease) ([place])    Peptic ulcer    Plantar fasciitis    Stroke ([place]) 01/31/2012   Right side-ministroke   T2DM (type 2 diabetes mellitus) ([place])    Past Surgical History:  Past Surgical History:  Procedure Laterality Date   ABDOMINAL HYSTERECTOMY  1990   BACK SURGERY  2000, 2004   CAROTID ENDARTERECTOMY Left 05/06/12   CARPAL TUNNEL RELEASE Left 07/18/2015   Procedure: CARPAL TUNNEL RELEASE;  Surgeon: Earnestine Leys, MD;  Location: [facility];  Service: Orthopedics;  Laterality: Left;   CEREBRAL ANGIOGRAM Bilateral 05/03/2012   Procedure: CEREBRAL ANGIOGRAM;  Surgeon: Angelia Mould, MD;  Location: [facility] CATH LAB;  Service: Cardiovascular;  Laterality: Bilateral;   CHOLECYSTECTOMY  2001   COLONOSCOPY WITH PROPOFOL N/A 11/19/2018   Procedure: COLONOSCOPY WITH PROPOFOL;  Surgeon: Lucilla Lame, MD;  Location: [place];  Service: Endoscopy;  Laterality: N/A;  Diabetic - insulin   CORONARY ARTERY BYPASS GRAFT N/A 01/18/2021   Procedure: CORONARY ARTERY  BYPASS GRAFTING (CABG) x 4  USING LEFT INTERNAL MAMMARY ARTERY AND LEFT ENDOSCOPIC GREATER SAPHENOUS VEIN CONDUITS;  Surgeon: [name], MD;  Location: [place];  Service: Open Heart Surgery;  Laterality: N/A;   CORONARY/GRAFT ACUTE MI REVASCULARIZATION N/A 01/13/2021   Procedure: Coronary/Graft Acute MI Revascularization;   Surgeon: Isaias Cowman, MD;  Location: [place] CV LAB;  Service: Cardiovascular;  Laterality: N/A;   ENDARTERECTOMY Left 05/06/2012   Procedure: ENDARTERECTOMY CAROTID;  Surgeon: Angelia Mould, MD;  Location: [place];  Service: Vascular;  Laterality: Left;   ENDARTERECTOMY Right 08/09/2013   Procedure: ENDARTERECTOMY CAROTID-RIGHT;  Surgeon: Angelia Mould, MD;  Location: [place];  Service: Vascular;  Laterality: Right;   ENDOVEIN HARVEST OF GREATER SAPHENOUS VEIN Left 01/18/2021   Procedure: ENDOVEIN HARVEST OF GREATER SAPHENOUS VEIN;  Surgeon: [name], MD;  Location: [place];  Service: Open Heart Surgery;  Laterality: Left;   HERNIA REPAIR     IABP INSERTION Right 01/13/2021   Procedure: IABP Insertion;  Surgeon: Isaias Cowman, MD;  Location: [place] CV LAB;  Service: Cardiovascular;  Laterality: Right;   LEFT HEART CATH AND CORONARY ANGIOGRAPHY N/A 01/13/2021   Procedure: LEFT HEART CATH AND CORONARY ANGIOGRAPHY;  Surgeon: Isaias Cowman, MD;  Location: [place] CV LAB;  Service: Cardiovascular;  Laterality: N/A;   LOWER EXTREMITY ANGIOGRAPHY Left 01/03/2021   Procedure: LOWER EXTREMITY ANGIOGRAPHY;  Surgeon: Algernon Huxley, MD;  Location: [place] CV LAB;  Service: Cardiovascular;  Laterality: Left;   LOWER EXTREMITY ANGIOGRAPHY Left 02/28/2021   Procedure: LOWER EXTREMITY ANGIOGRAPHY;  Surgeon: Algernon Huxley, MD;  Location: [place] CV LAB;  Service: Cardiovascular;  Laterality: Left;   PATCH ANGIOPLASTY Left 05/06/2012   Procedure: WITH DACRON PATCH ANGIOPLASTY ;  Surgeon: Angelia Mould, MD;  Location: [place];  Service: Vascular;  Laterality: Left;   POLYPECTOMY  11/19/2018   Procedure: POLYPECTOMY;  Surgeon: Lucilla Lame, MD;  Location: [place];  Service: Endoscopy;;   SPINE SURGERY  2004   TEE WITHOUT CARDIOVERSION N/A 01/18/2021   Procedure: TRANSESOPHAGEAL ECHOCARDIOGRAM (TEE);  Surgeon: [name], MD;  Location: [place];  Service: Open Heart Surgery;  Laterality: N/A;   TONSILLECTOMY     TUBAL LIGATION     HPI:  Per H&P "Morgan Daniels is a 61 y.o. female with medical history significant of CVA, diabetes mellitus, hypertension, peripheral arterial disease s/p bilateral carotid endarterectomies, CAD s/p CABG presented to ED with complaint of generalized weakness and lightheadedness since she woke up this morning.  Per patient she was at her normal self last night when she went to bed around 10 PM.  Patient called EMS due to worsening dizziness and confusion, received 500 cc fluid due to softer blood pressure.  Patient denies any focal weakness.  No chest pain or shortness of breath.  No headaches or blurry vision.  Patient was quite unstable on her feet.     No recent illnesses, fever or chills.  No urinary symptoms.     ED course.  Hemodynamically stable.  Labs pertinent for creatinine of 1.10 with baseline less than 1, does not meet the criteria for AKI.  Initial CT head was negative for any acute abnormalities.  MRI brain was obtained when patient remained persistently imbalanced and dizzy and it shows multiple punctate acute infarcts involving bilateral frontal white matter and cerebellar hemisphere involving multiple vascular territories, concerning for embolic stroke.  CTA head and neck with scattered  mild plaques in the bilateral carotid system without hemodynamically significant stenosis or occlusion.  Patent vertebral arteries.  Significant calcified plaques noted in intracranial ICAs resulting in up to mild to moderate stenosis on the right and mild stenosis on the left."   Assessment / Plan / Recommendation Clinical Impression  Pt seen for cognitive-linguistic evaluation. Pt alert, pleasant, and cooperative. Pt denies changes to speech/language/cognition or swallowing function.   Evaluation completed via informal means and portions of Cognistat. Pt demonstrated intact functional auditory  comprehension and verbal expression. Speech is fluent, appropriate, and without s/sx anomia or dysarthria. Pt demonstrated intact basic cognitive-linguistic ability with no obvious deficits per assessment.   Suspect pt is at or near baseline. SLP to sign off as pt has no acute SLP needs at this time.   Pt and RN made aware of results, recommendations, and SLP. Pt verbalized understanding/agreement.    SLP Assessment  SLP Recommendation/Assessment: Patient does not need any further [facility] SLP Visit Diagnosis: Frontal lobe and executive function deficit Frontal lobe and executive function deficit following: Cerebral infarction    Recommendations for follow up therapy are one component of a multi-disciplinary discharge planning process, led by the attending physician.  Recommendations may be updated based on patient status, additional functional criteria and insurance authorization.    Follow Up Recommendations  No SLP follow up    Assistance Recommended at Discharge   (defer to OT/PT)  Functional Status Assessment Patient has not had a recent decline in their functional status     SLP Evaluation Cognition  Overall Cognitive Status: Within Functional Limits for tasks assessed Arousal/Alertness: Awake/alert Orientation Level: Oriented X4 Day of Week: Correct Attention:  [facility]) Memory: Appears intact Awareness: Appears intact Problem Solving: Appears intact Executive Function:  (WFL) Safety/Judgment: Appears intact       Comprehension  Auditory Comprehension Overall Auditory Comprehension: Appears within functional limits for tasks assessed Yes/No Questions: Within Functional Limits Commands: Within Functional Limits Conversation: Complex [facility]) Visual Recognition/Discrimination Discrimination:  (DNT) Reading Comprehension Reading Status:  (DNT)    Expression Expression Primary Mode of Expression: Verbal Verbal Expression Overall Verbal  Expression: Appears within functional limits for tasks assessed Initiation: No impairment Automatic Speech:  (WFL) Repetition:  (WFL) Naming: No impairment Pragmatics: No impairment Written Expression Dominant Hand: Right Written Expression:  (DNT)   Oral / Motor  Oral Motor/Sensory Function Overall Oral Motor/Sensory Function: Within functional limits Motor Speech Overall Motor Speech: Appears within functional limits for tasks assessed Respiration: Within functional limits Phonation: Normal Resonance: Within functional limits Articulation: Within functional limitis Intelligibility: Intelligible Motor Planning: Witnin functional limits Motor Speech Errors: Not applicable           Cherrie Gauze, M.S., [facility] [phone] ([place])   Quintella Baton 09/19/2021, 8:50 AM

## 2021-09-19 NOTE — Progress Notes (Signed)
Pt's bp remains elevated. Dr. Leslye Peer notified.no new orders received.  09/19/21 1600  Vitals  BP (!) 177/90  MAP (mmHg) 116  BP Location Left Arm  BP Method Automatic  Patient Position (if appropriate) Lying  Pulse Rate 75  MEWS COLOR  MEWS Score Color Green  MEWS Score  MEWS Temp 0  MEWS Systolic 0  MEWS Pulse 0  MEWS RR 0  MEWS LOC 0  MEWS Score 0

## 2021-09-24 ENCOUNTER — Encounter: Payer: Medicare Other | Attending: Physician Assistant | Admitting: Physician Assistant

## 2021-09-24 DIAGNOSIS — L97422 Non-pressure chronic ulcer of left heel and midfoot with fat layer exposed: Secondary | ICD-10-CM | POA: Diagnosis not present

## 2021-09-24 DIAGNOSIS — E1151 Type 2 diabetes mellitus with diabetic peripheral angiopathy without gangrene: Secondary | ICD-10-CM | POA: Diagnosis not present

## 2021-09-24 DIAGNOSIS — L97528 Non-pressure chronic ulcer of other part of left foot with other specified severity: Secondary | ICD-10-CM | POA: Insufficient documentation

## 2021-09-24 DIAGNOSIS — E7800 Pure hypercholesterolemia, unspecified: Secondary | ICD-10-CM | POA: Insufficient documentation

## 2021-09-24 DIAGNOSIS — E11621 Type 2 diabetes mellitus with foot ulcer: Secondary | ICD-10-CM | POA: Diagnosis not present

## 2021-09-24 DIAGNOSIS — E1142 Type 2 diabetes mellitus with diabetic polyneuropathy: Secondary | ICD-10-CM | POA: Diagnosis not present

## 2021-09-24 LAB — CULTURE, BLOOD (ROUTINE X 2)
Culture: NO GROWTH
Culture: NO GROWTH
Special Requests: ADEQUATE
Special Requests: ADEQUATE

## 2021-09-24 NOTE — Progress Notes (Addendum)
Morgan, Daniels ([id number]) Visit Report for 09/24/2021 Chief Complaint Document Details Patient Name: Morgan Daniels, Morgan Daniels Date of Service: 09/24/2021 11:30 AM Medical Record Number: [id number] Patient Account Number: [id number] Date of Birth/Sex: [birth year] (61 y.o. F) Treating RN: Alycia Rossetti Primary Care Provider: Jonetta Osgood Other Clinician: Referring Provider: Jonetta Osgood Treating Provider/Extender: Skipper Cliche in Treatment: 2 Information Obtained from: Patient Chief Complaint 09/11/2022; patient comes in for review of an area on the tip of her left heel Electronic Signature(s) Signed: 09/24/2021 2:59:21 PM By: Alycia Rossetti Signed: 09/24/2021 3:58:52 PM By: Worthy Keeler PA-C Previous Signature: 09/24/2021 11:36:11 AM Version By: Worthy Keeler PA-C Entered By: Alycia Rossetti on 09/24/2021 12:56:18 Morgan Daniels ([id number]) -------------------------------------------------------------------------------- Debridement Details Patient Name: Morgan Daniels Date of Service: 09/24/2021 11:30 AM Medical Record Number: [id number] Patient Account Number: [id number] Date of Birth/Sex: 1960-09-09 (61 y.o. F) Treating RN: Alycia Rossetti Primary Care Provider: Jonetta Osgood Other Clinician: Referring Provider: Jonetta Osgood Treating Provider/Extender: Skipper Cliche in Treatment: 2 Debridement Performed for Wound #1 Left Calcaneus Assessment: Performed By: Physician Tommie Sams., PA-C Debridement Type: Debridement Severity of Tissue Pre Debridement: Fat layer exposed Level of Consciousness (Pre- Awake and Alert procedure): Pre-procedure Verification/Time Out Yes - 12:25 Taken: Start Time: 12:25 Pain Control: Lidocaine Injectable : Total Area Debrided (L x W): 1.2 (cm) x 2 (cm) = 2.4 (cm) Tissue and other material Viable, Non-Viable, Eschar, Slough, Subcutaneous, Slough debrided: Level: Skin/Subcutaneous Tissue Debridement Description:  Excisional Instrument: Curette Bleeding: Minimum Hemostasis Achieved: Pressure Procedural Pain: 0 Post Procedural Pain: 0 Response to Treatment: Procedure was tolerated well Level of Consciousness (Post- Awake and Alert procedure): Post Debridement Measurements of Total Wound Length: (cm) 1.5 Width: (cm) 0.5 Depth: (cm) 0.3 Volume: (cm) 0.177 Character of Wound/Ulcer Post Debridement: Stable Severity of Tissue Post Debridement: Fat layer exposed Post Procedure Diagnosis Same as Pre-procedure Electronic Signature(s) Signed: 09/24/2021 2:59:21 PM By: Alycia Rossetti Signed: 09/24/2021 3:58:52 PM By: Worthy Keeler PA-C Entered By: Alycia Rossetti on 09/24/2021 12:39:06 Morgan Daniels ([id number]) -------------------------------------------------------------------------------- HPI Details Patient Name: Morgan Daniels Date of Service: 09/24/2021 11:30 AM Medical Record Number: [id number] Patient Account Number: [id number] Date of Birth/Sex: 03-07-1960 (61 y.o. F) Treating RN: Alycia Rossetti Primary Care Provider: Jonetta Osgood Other Clinician: Referring Provider: Jonetta Osgood Treating Provider/Extender: Skipper Cliche in Treatment: 2 History of Present Illness HPI Description: ADMISSION 09/10/2021 This is a 61 year old woman who arrived in clinic accompanied by her husband. She is here for review of a area on the tip of her left heel which has been present by her description for about 3 months. I did not see any description of this in care everywhere. She is a type II diabetic with peripheral arterial disease and peripheral neuropathy. In fact she has had 2 procedures by Dr. Lucky Cowboy of vein and vascular 1 on 01/03/2021 at which time she had a angioplasty of the left anterior tibial artery and a mechanical thrombectomy of the left SFA and proximal popliteal. She underwent that she then underwent an angioplasty of the left SFA and pop and proximal popliteal and a stent in the left  SFA. On 02/28/2021 she again went underwent a mechanical thrombectomy of the left SFA and popliteal arteries and a stent placement in the left SFA and proximal popliteal as well as reangioplasty of the left anterior tibial. The patient states that the area on the left heel started about 3 months ago it is scabbed over and she cannot get  this to heal. She is using a foot cream that was given to her by podiatry [foot miracle]. She also has an area on the right heel medially almost mirror image to the left although there is no open wound here the skin in the area is dry and scaly. There is no evidence of infection Past medical history includes type 2 diabetes with PAD and peripheral neuropathy, coronary artery disease status post CABG x4, depression, hypertension, hypercholesterolemia, irritable bowel, carotid artery stenosis 09-24-2021 upon evaluation today patient appears to be doing well currently in regard to her wound as far as infection is concerned there is no signs of active infection at this time which is great news. With that being said she is still having significant pain here. Unfortunately I think this is more neuropathic in nature and is not to be a whole lot to do for this except for to get the wound healed so that there is no source of irritation and inflammation. With that being said I do believe that we need to perform debridement to do this will probably need to numb her. Electronic Signature(s) Signed: 09/24/2021 1:13:21 PM By: Worthy Keeler PA-C Entered By: Worthy Keeler on 09/24/2021 13:13:21 KEAGAN, ANTHIS ([id number]) -------------------------------------------------------------------------------- Physical Exam Details Patient Name: Morgan Daniels Date of Service: 09/24/2021 11:30 AM Medical Record Number: [id number] Patient Account Number: [id number] Date of Birth/Sex: 10-10-1960 (61 y.o. F) Treating RN: Alycia Rossetti Primary Care Provider: Jonetta Osgood Other  Clinician: Referring Provider: Jonetta Osgood Treating Provider/Extender: Skipper Cliche in Treatment: 2 Constitutional Well-nourished and well-hydrated in no acute distress. Respiratory normal breathing without difficulty. Psychiatric this patient is able to make decisions and demonstrates good insight into disease process. Alert and Oriented x 3. pleasant and cooperative. Notes Upon inspection patient's wound bed shows mainly eschar covering. With that being said I do believe that she is going to require sharp debridement I did perform numbing with lidocaine 2% total amount of 5 cc and subsequently I was able to get this numb so that we could actually perform the procedure. The patient tolerated the debridement then without complication I was able to remove the eschar from the surface of the wound and postdebridement this appears to be doing significantly better. I do believe that this is good news as the Santyl would have taken a very long time to clear this all the way even possibly a couple months. Electronic Signature(s) Signed: 09/24/2021 1:14:19 PM By: Worthy Keeler PA-C Entered By: Worthy Keeler on 09/24/2021 13:14:19 CALVINA, LIPTAK ([id number]) -------------------------------------------------------------------------------- Physician Orders Details Patient Name: Morgan Daniels Date of Service: 09/24/2021 11:30 AM Medical Record Number: [id number] Patient Account Number: [id number] Date of Birth/Sex: 1960/02/27 (61 y.o. F) Treating RN: Alycia Rossetti Primary Care Provider: Jonetta Osgood Other Clinician: Referring Provider: Jonetta Osgood Treating Provider/Extender: Skipper Cliche in Treatment: 2 Verbal / Phone Orders: No Diagnosis Coding ICD-10 Coding Code Description E11.621 Type 2 diabetes mellitus with foot ulcer L97.528 Non-pressure chronic ulcer of other part of left foot with other specified severity E11.51 Type 2 diabetes mellitus with diabetic  peripheral angiopathy without gangrene E11.42 Type 2 diabetes mellitus with diabetic polyneuropathy Follow-up Appointments o Return Appointment in 1 week. Bathing/ Shower/ Hygiene o May shower; gently cleanse wound with antibacterial soap, rinse and pat dry prior to dressing wounds Off-Loading Wound #1 Left Calcaneus o Open toe surgical shoe Wound Treatment Wound #1 - Calcaneus Wound Laterality: Left Cleanser: Byram Ancillary Kit - 15 Day Supply (  Generic) 1 x Per Day/30 Days Discharge Instructions: Use supplies as instructed; Kit contains: (15) Saline Bullets; (15) 3x3 Gauze; 15 pr Gloves Topical: Santyl Collagenase Ointment, 30 (gm), tube 1 x Per Day/30 Days Discharge Instructions: apply nickel thick to wound bed only Primary Dressing: Gauze (Generic) 1 x Per Day/30 Days Discharge Instructions: As directed: dry, moistened with saline or moistened with Dakins Solution Secondary Dressing: Conforming Guaze Roll-Medium (Generic) 1 x Per Day/30 Days Discharge Instructions: Apply Conforming Stretch Guaze Bandage as directed Secured With: [place] H Soft Cloth Surgical Tape, 2x2 (in/yd) (Generic) 1 x Per Day/30 Days Electronic Signature(s) Signed: 09/24/2021 2:59:21 PM By: Alycia Rossetti Signed: 09/24/2021 3:58:52 PM By: Worthy Keeler PA-C Previous Signature: 09/24/2021 12:13:28 PM Version By: Alycia Rossetti Entered By: Alycia Rossetti on 09/24/2021 12:54:09 Morgan Daniels ([id number]) -------------------------------------------------------------------------------- Problem List Details Patient Name: Morgan Daniels Date of Service: 09/24/2021 11:30 AM Medical Record Number: [id number] Patient Account Number: [id number] Date of Birth/Sex: 01-09-1961 (61 y.o. F) Treating RN: Alycia Rossetti Primary Care Provider: Jonetta Osgood Other Clinician: Referring Provider: Jonetta Osgood Treating Provider/Extender: Skipper Cliche in Treatment: 2 Active  Problems ICD-10 Encounter Code Description Active Date MDM Diagnosis E11.621 Type 2 diabetes mellitus with foot ulcer 09/10/2021 No Yes L97.528 Non-pressure chronic ulcer of other part of left foot with other specified 09/10/2021 No Yes severity E11.51 Type 2 diabetes mellitus with diabetic peripheral angiopathy without 09/10/2021 No Yes gangrene E11.42 Type 2 diabetes mellitus with diabetic polyneuropathy 09/10/2021 No Yes Inactive Problems Resolved Problems Electronic Signature(s) Signed: 09/24/2021 2:59:21 PM By: Alycia Rossetti Signed: 09/24/2021 3:58:52 PM By: Worthy Keeler PA-C Previous Signature: 09/24/2021 11:36:09 AM Version By: Worthy Keeler PA-C Entered By: Alycia Rossetti on 09/24/2021 12:56:02 Morgan Daniels ([id number]) -------------------------------------------------------------------------------- Progress Note Details Patient Name: Morgan Daniels Date of Service: 09/24/2021 11:30 AM Medical Record Number: [id number] Patient Account Number: [id number] Date of Birth/Sex: Oct 23, 1960 (61 y.o. F) Treating RN: Alycia Rossetti Primary Care Provider: Jonetta Osgood Other Clinician: Referring Provider: Jonetta Osgood Treating Provider/Extender: Skipper Cliche in Treatment: 2 Subjective Chief Complaint Information obtained from Patient 09/11/2022; patient comes in for review of an area on the tip of her left heel History of Present Illness (HPI) ADMISSION 09/10/2021 This is a 61 year old woman who arrived in clinic accompanied by her husband. She is here for review of a area on the tip of her left heel which has been present by her description for about 3 months. I did not see any description of this in care everywhere. She is a type II diabetic with peripheral arterial disease and peripheral neuropathy. In fact she has had 2 procedures by Dr. Lucky Cowboy of vein and vascular 1 on 01/03/2021 at which time she had a angioplasty of the left anterior tibial artery and a mechanical  thrombectomy of the left SFA and proximal popliteal. She underwent that she then underwent an angioplasty of the left SFA and pop and proximal popliteal and a stent in the left SFA. On 02/28/2021 she again went underwent a mechanical thrombectomy of the left SFA and popliteal arteries and a stent placement in the left SFA and proximal popliteal as well as reangioplasty of the left anterior tibial. The patient states that the area on the left heel started about 3 months ago it is scabbed over and she cannot get this to heal. She is using a foot cream that was given to her by podiatry [foot miracle]. She also has an area on  the right heel medially almost mirror image to the left although there is no open wound here the skin in the area is dry and scaly. There is no evidence of infection Past medical history includes type 2 diabetes with PAD and peripheral neuropathy, coronary artery disease status post CABG x4, depression, hypertension, hypercholesterolemia, irritable bowel, carotid artery stenosis 09-24-2021 upon evaluation today patient appears to be doing well currently in regard to her wound as far as infection is concerned there is no signs of active infection at this time which is great news. With that being said she is still having significant pain here. Unfortunately I think this is more neuropathic in nature and is not to be a whole lot to do for this except for to get the wound healed so that there is no source of irritation and inflammation. With that being said I do believe that we need to perform debridement to do this will probably need to numb her. Objective Constitutional Well-nourished and well-hydrated in no acute distress. Vitals Time Taken: 11:40 AM, Height: 67 in, Weight: 200 lbs, BMI: 31.3, Temperature: 98.2 F, Pulse: 71 bpm, Respiratory Rate: 16 breaths/min, Blood Pressure: 165/89 mmHg. Respiratory normal breathing without difficulty. Psychiatric this patient is able to make  decisions and demonstrates good insight into disease process. Alert and Oriented x 3. pleasant and cooperative. General Notes: Upon inspection patient's wound bed shows mainly eschar covering. With that being said I do believe that she is going to require sharp debridement I did perform numbing with lidocaine 2% total amount of 5 cc and subsequently I was able to get this numb so that we could actually perform the procedure. The patient tolerated the debridement then without complication I was able to remove the eschar from the surface of the wound and postdebridement this appears to be doing significantly better. I do believe that this is good news as the Santyl would have taken a very long time to clear this all the way even possibly a couple months. Integumentary (Hair, Skin) Wound #1 status is Open. Original cause of wound was Gradually Appeared. The date acquired was: 04/17/2021. The wound has been in treatment 2 weeks. The wound is located on the Left Calcaneus. The wound measures 1.2cm length x 2cm width x 0.1cm depth; 1.885cm^2 area and 0.188cm^3 volume. There is a none present amount of drainage noted. The wound margin is flat and intact. There is no granulation within the Munising, Theola P. ([id number]) wound bed. There is a large (67-100%) amount of necrotic tissue within the wound bed including Eschar. Assessment Active Problems ICD-10 Type 2 diabetes mellitus with foot ulcer Non-pressure chronic ulcer of other part of left foot with other specified severity Type 2 diabetes mellitus with diabetic peripheral angiopathy without gangrene Type 2 diabetes mellitus with diabetic polyneuropathy Procedures Wound #1 Pre-procedure diagnosis of Wound #1 is a Diabetic Wound/Ulcer of the Lower Extremity located on the Left Calcaneus .Severity of Tissue Pre Debridement is: Fat layer exposed. There was a Excisional Skin/Subcutaneous Tissue Debridement with a total area of 2.4 sq cm performed by Tommie Sams., PA-C. With the following instrument(s): Curette to remove Viable and Non-Viable tissue/material. Material removed includes Eschar, Subcutaneous Tissue, and Slough after achieving pain control using Lidocaine Injectable. No specimens were taken. A time out was conducted at 12:25, prior to the start of the procedure. A Minimum amount of bleeding was controlled with Pressure. The procedure was tolerated well with a pain level of 0 throughout and a  pain level of 0 following the procedure. Post Debridement Measurements: 1.5cm length x 0.5cm width x 0.3cm depth; 0.177cm^3 volume. Character of Wound/Ulcer Post Debridement is stable. Severity of Tissue Post Debridement is: Fat layer exposed. Post procedure Diagnosis Wound #1: Same as Pre-Procedure Plan Follow-up Appointments: Return Appointment in 1 week. Bathing/ Shower/ Hygiene: May shower; gently cleanse wound with antibacterial soap, rinse and pat dry prior to dressing wounds Off-Loading: Wound #1 Left Calcaneus: Open toe surgical shoe WOUND #1: - Calcaneus Wound Laterality: Left Cleanser: Byram Ancillary Kit - 15 Day Supply (Generic) 1 x Per Day/30 Days Discharge Instructions: Use supplies as instructed; Kit contains: (15) Saline Bullets; (15) 3x3 Gauze; 15 pr Gloves Topical: Santyl Collagenase Ointment, 30 (gm), tube 1 x Per Day/30 Days Discharge Instructions: apply nickel thick to wound bed only Primary Dressing: Gauze (Generic) 1 x Per Day/30 Days Discharge Instructions: As directed: dry, moistened with saline or moistened with Dakins Solution Secondary Dressing: Conforming Guaze Roll-Medium (Generic) 1 x Per Day/30 Days Discharge Instructions: Apply Conforming Stretch Guaze Bandage as directed Secured With: Medipore Tape - 70M Medipore H Soft Cloth Surgical Tape, 2x2 (in/yd) (Generic) 1 x Per Day/30 Days 1. Based on what I am seeing currently I do believe that we need to continue with the wound care measures as before and the  patient is in agreement with plan. This includes the use of the Santyl for the time being we may switch that up in the coming weeks but for this week I did want to make sure that we keep the surface as clean as possible after debridement today. 2. I am also can recommend that we have the patient continue with the moistened gauze to cover followed by roll gauze to secure in place for a bulky dressing. 3. She should continue with the postop shoe which I think is still good to be the best way to go here currently. We will see patient back for reevaluation in 1 week here in the clinic. If anything worsens or changes patient will contact our office for additional recommendations. Electronic Signature(s) Signed: 09/24/2021 1:15:45 PM By: Gwenyth Bender, Seva P. ([id number]) Entered By: Worthy Keeler on 09/24/2021 13:15:44 KIMIYE, STRATHMAN ([id number]) -------------------------------------------------------------------------------- SuperBill Details Patient Name: Morgan Daniels Date of Service: 09/24/2021 Medical Record Number: [id number] Patient Account Number: [id number] Date of Birth/Sex: 1960-06-16 (61 y.o. F) Treating RN: Alycia Rossetti Primary Care Provider: Jonetta Osgood Other Clinician: Referring Provider: Jonetta Osgood Treating Provider/Extender: Skipper Cliche in Treatment: 2 Diagnosis Coding ICD-10 Codes Code Description E11.621 Type 2 diabetes mellitus with foot ulcer L97.528 Non-pressure chronic ulcer of other part of left foot with other specified severity E11.51 Type 2 diabetes mellitus with diabetic peripheral angiopathy without gangrene E11.42 Type 2 diabetes mellitus with diabetic polyneuropathy Facility Procedures CPT4 Code: [id number] Description: 82707 - DEB SUBQ TISSUE 20 SQ CM/< Modifier: Quantity: 1 CPT4 Code: Description: ICD-10 Diagnosis Description E11.621 Type 2 diabetes mellitus with foot ulcer Modifier: Quantity: Physician  Procedures CPT4 Code: [id number] Description: 20100 - WC PHYS SUBQ TISS 20 SQ CM Modifier: Quantity: 1 CPT4 Code: Description: ICD-10 Diagnosis Description E11.621 Type 2 diabetes mellitus with foot ulcer Modifier: Quantity: Electronic Signature(s) Signed: 09/24/2021 1:21:15 PM By: Worthy Keeler PA-C Entered By: Worthy Keeler on 09/24/2021 13:21:15

## 2021-09-24 NOTE — Progress Notes (Addendum)
ROGAN, ECKLUND ([id number]) Visit Report for 09/24/2021 Arrival Information Details Patient Name: Morgan Daniels, Morgan Daniels Date of Service: 09/24/2021 11:30 AM Medical Record Number: [id number] Patient Account Number: [id number] Date of Birth/Sex: [birth year] (61 y.o. F) Treating RN: Alycia Rossetti Primary Care Eleanora Guinyard: Jonetta Osgood Other Clinician: Referring Tricia Pledger: Jonetta Osgood Treating Delayla Hoffmaster/Extender: Skipper Cliche in Treatment: 2 Visit Information History Since Last Visit Added or deleted any medications: Yes Patient Arrived: Ambulatory Any new allergies or adverse reactions: No Arrival Time: 11:44 Hospitalized since last visit: Yes Accompanied By: husband Has Dressing in Place as Prescribed: Yes Transfer Assistance: None Pain Present Now: Yes Patient Requires Transmission-Based No Precautions: Patient Has Alerts: Yes Patient Alerts: Patient on Blood Thinner Type II Diabetic Aspirin 73m AVVS 04/01/20 ABI R 1.02 L 1.04 TBI R 0.89 L 0.87 Electronic Signature(s) Signed: 09/24/2021 2:59:21 PM By: CAlycia RossettiEntered By: CAlycia Rossettion 09/24/2021 11:45:27 Morgan Daniels([id number] -------------------------------------------------------------------------------- Clinic Level of Care Assessment Details Patient Name: Morgan RanaDate of Service: 09/24/2021 11:30 AM Medical Record Number: [id number] Account Number: [id number] of Birth/Sex: [birth year](61 y.o. F) Treating RN: CAlycia RossettiPrimary Care Pearly Apachito: AJonetta OsgoodOther Clinician: Referring Haili Donofrio: AJonetta OsgoodTreating Breeanne Oblinger/Extender: SSkipper Clichein Treatment: 2 Clinic Level of Care Assessment Items TOOL 1 Quantity Score '[]'  - Use when EandM and Procedure is performed on INITIAL visit 0 ASSESSMENTS - Nursing Assessment / Reassessment '[]'  - General Physical Exam (combine w/ comprehensive assessment (listed just below) when performed on new 0 pt. evals) '[]'  -  0 Comprehensive Assessment (HX, ROS, Risk Assessments, Wounds Hx, etc.) ASSESSMENTS - Wound and Skin Assessment / Reassessment '[]'  - Dermatologic / Skin Assessment (not related to wound area) 0 ASSESSMENTS - Ostomy and/or Continence Assessment and Care '[]'  - Incontinence Assessment and Management 0 '[]'  - 0 Ostomy Care Assessment and Management (repouching, etc.) PROCESS - Coordination of Care '[]'  - Simple Patient / Family Education for ongoing care 0 '[]'  - 0 Complex (extensive) Patient / Family Education for ongoing care '[]'  - 0 Staff obtains CProgrammer, systems Records, Test Results / Process Orders '[]'  - 0 Staff telephones HHA, Nursing Homes / Clarify orders / etc '[]'  - 0 Routine Transfer to another Facility (non-emergent condition) '[]'  - 0 Routine Hospital Admission (non-emergent condition) '[]'  - 0 New Admissions / IBiomedical engineer/ Ordering NPWT, Apligraf, etc. '[]'  - 0 Emergency Hospital Admission (emergent condition) PROCESS - Special Needs '[]'  - Pediatric / Minor Patient Management 0 '[]'  - 0 Isolation Patient Management '[]'  - 0 Hearing / Language / Visual special needs '[]'  - 0 Assessment of Community assistance (transportation, D/C planning, etc.) '[]'  - 0 Additional assistance / Altered mentation '[]'  - 0 Support Surface(s) Assessment (bed, cushion, seat, etc.) INTERVENTIONS - Miscellaneous '[]'  - External ear exam 0 '[]'  - 0 Patient Transfer (multiple staff / HCivil Service fast streamer/ Similar devices) '[]'  - 0 Simple Staple / Suture removal (25 or less) '[]'  - 0 Complex Staple / Suture removal (26 or more) '[]'  - 0 Hypo/Hyperglycemic Management (do not check if billed separately) '[]'  - 0 Ankle / Brachial Index (ABI) - do not check if billed separately Has the patient been seen at the hospital within the last three years: Yes Total Score: 0 Level Of Care: ____ Morgan Daniels([id number] Electronic Signature(s) Signed: 09/24/2021 2:59:21 PM By: CAlycia RossettiEntered By: CAlycia Rossettion 09/24/2021  12:54:19 Morgan Daniels([id number] -------------------------------------------------------------------------------- Encounter Discharge Information Details Patient Name: Morgan RanaDate of Service: 09/24/2021  11:30 AM Medical Record Number: [id number] Patient Account Number: [id number] Date of Birth/Sex: Oct 22, 1960 (61 y.o. F) Treating RN: Alycia Rossetti Primary Care Kania Regnier: Jonetta Osgood Other Clinician: Referring Pollyann Roa: Jonetta Osgood Treating Jannel Lynne/Extender: Skipper Cliche in Treatment: 2 Encounter Discharge Information Items Post Procedure Vitals Discharge Condition: Stable Temperature (F): 98.2 Ambulatory Status: Cane Pulse (bpm): 71 Discharge Destination: Home Respiratory Rate (breaths/min): 16 Transportation: Private Auto Blood Pressure (mmHg): 165/89 Schedule Follow-up Appointment: Yes Clinical Summary of Care: Electronic Signature(s) Signed: 09/24/2021 12:59:47 PM By: Alycia Rossetti Entered By: Alycia Rossetti on 09/24/2021 12:59:46 Morgan Daniels ([id number]) -------------------------------------------------------------------------------- Lower Extremity Assessment Details Patient Name: Morgan Daniels Date of Service: 09/24/2021 11:30 AM Medical Record Number: [id number] Patient Account Number: [id number] Date of Birth/Sex: 02-18-1960 (61 y.o. F) Treating RN: Alycia Rossetti Primary Care Kennard Fildes: Jonetta Osgood Other Clinician: Referring Ikaika Showers: Jonetta Osgood Treating Linsey Arteaga/Extender: Jeri Cos Weeks in Treatment: 2 Edema Assessment Assessed: [Left: Yes] [Right: No] [Left: Edema] [Right: :] Calf Left: Right: Point of Measurement: 30 cm From Medial Instep 39 cm Ankle Left: Right: Point of Measurement: 9.5 cm From Medial Instep 20.5 cm Vascular Assessment Pulses: Dorsalis Pedis Palpable: [Left:Yes] Electronic Signature(s) Signed: 09/24/2021 12:12:41 PM By: Alycia Rossetti Entered By: Alycia Rossetti on 09/24/2021 12:12:41 Morgan Daniels ([id number]) -------------------------------------------------------------------------------- Multi Wound Chart Details Patient Name: Morgan Daniels Date of Service: 09/24/2021 11:30 AM Medical Record Number: [id number] Patient Account Number: [id number] Date of Birth/Sex: 1960-02-28 (61 y.o. F) Treating RN: Alycia Rossetti Primary Care Booker Bhatnagar: Jonetta Osgood Other Clinician: Referring Yanis Larin: Jonetta Osgood Treating Daruis Swaim/Extender: Skipper Cliche in Treatment: 2 Vital Signs Height(in): 20 Pulse(bpm): 70 Weight(lbs): 200 Blood Pressure(mmHg): 165/89 Body Mass Index(BMI): 31.3 Temperature(F): 98.2 Respiratory Rate(breaths/min): 16 Photos: [N/A:N/A] Wound Location: Left Calcaneus N/A N/A Wounding Event: Gradually Appeared N/A N/A Primary Etiology: Diabetic Wound/Ulcer of the Lower N/A N/A Extremity Comorbid History: Coronary Artery Disease, N/A N/A Hypertension, Myocardial Infarction, Type II Diabetes, Neuropathy Date Acquired: 04/17/2021 N/A N/A Weeks of Treatment: 2 N/A N/A Wound Status: Open N/A N/A Wound Recurrence: No N/A N/A Measurements L x W x D (cm) 1.2x2x0.1 N/A N/A Area (cm) : 1.885 N/A N/A Volume (cm) : 0.188 N/A N/A % Reduction in Area: -33.30% N/A N/A % Reduction in Volume: -33.30% N/A N/A Classification: Unable to visualize wound bed N/A N/A Exudate Amount: None Present N/A N/A Wound Margin: Flat and Intact N/A N/A Granulation Amount: None Present (0%) N/A N/A Necrotic Amount: Large (67-100%) N/A N/A Necrotic Tissue: Eschar N/A N/A Exposed Structures: Fascia: No N/A N/A Fat Layer (Subcutaneous Tissue): No Tendon: No Muscle: No Joint: No Bone: No Epithelialization: None N/A N/A Treatment Notes Electronic Signature(s) Signed: 09/24/2021 12:13:05 PM By: Alycia Rossetti Entered By: Alycia Rossetti on 09/24/2021 12:13:04 Morgan Daniels  ([id number]) -------------------------------------------------------------------------------- [place] Details Patient Name: Morgan Daniels Date of Service: 09/24/2021 11:30 AM Medical Record Number: [id number] Patient Account Number: [id number] Date of Birth/Sex: [birth year] (61 y.o. F) Treating RN: Alycia Rossetti Primary Care Preslee Regas: Jonetta Osgood Other Clinician: Referring Reshawn Ostlund: Jonetta Osgood Treating Micajah Dennin/Extender: Skipper Cliche in Treatment: 2 Active Inactive Necrotic Tissue Nursing Diagnoses: Impaired tissue integrity related to necrotic/devitalized tissue Knowledge deficit related to management of necrotic/devitalized tissue Goals: Necrotic/devitalized tissue will be minimized in the wound bed Date Initiated: 09/10/2021 Target Resolution Date: 09/10/2021 Goal Status: Active Patient/caregiver will verbalize understanding of reason and process for debridement of necrotic tissue Date Initiated: 09/10/2021 Target Resolution Date: 09/10/2021 Goal Status: Active Interventions: Assess patient pain level pre-, during and  post procedure and prior to discharge Provide education on necrotic tissue and debridement process Treatment Activities: Excisional debridement : 09/10/2021 Notes: Orientation to the Wound Care Program Nursing Diagnoses: Knowledge deficit related to the wound healing center program Goals: Patient/caregiver will verbalize understanding of the [facility] Program Date Initiated: 09/10/2021 Target Resolution Date: 09/10/2021 Goal Status: Active Interventions: Provide education on orientation to the wound center Notes: Wound/Skin Impairment Nursing Diagnoses: Impaired tissue integrity Goals: Patient/caregiver will verbalize understanding of skin care regimen Date Initiated: 09/10/2021 Target Resolution Date: 09/10/2021 Goal Status: Active Ulcer/skin breakdown will have a volume reduction of 30% by week 4 Date  Initiated: 09/10/2021 Target Resolution Date: 10/08/2021 Goal Status: Active Interventions: Assess patient/caregiver ability to obtain necessary supplies Assess patient/caregiver ability to perform ulcer/skin care regimen upon admission and as needed SCHARLENE, CATALINA ([id number]) Assess ulceration(s) every visit Treatment Activities: Skin care regimen initiated : 09/10/2021 Topical wound management initiated : 09/10/2021 Notes: Electronic Signature(s) Signed: 09/24/2021 12:12:50 PM By: Alycia Rossetti Entered By: Alycia Rossetti on 09/24/2021 12:12:50 Morgan Daniels ([id number]) -------------------------------------------------------------------------------- Pain Assessment Details Patient Name: Morgan Daniels Date of Service: 09/24/2021 11:30 AM Medical Record Number: [id number] Patient Account Number: [id number] Date of Birth/Sex: 09-15-1960 (61 y.o. F) Treating RN: Alycia Rossetti Primary Care Gurnoor Ursua: Jonetta Osgood Other Clinician: Referring Alanny Rivers: Jonetta Osgood Treating Avelina Mcclurkin/Extender: Skipper Cliche in Treatment: 2 Active Problems Location of Pain Severity and Description of Pain Patient Has Paino Yes Site Locations Rate the pain. Current Pain Level: 6 Pain Management and Medication Current Pain Management: Goals for Pain Management Patient has Gabapentin and Tramadol for pain states with good relief. Electronic Signature(s) Signed: 09/24/2021 12:12:23 PM By: Alycia Rossetti Entered By: Alycia Rossetti on 09/24/2021 12:12:22 Morgan Daniels ([id number]) -------------------------------------------------------------------------------- Patient/Caregiver Education Details Patient Name: Morgan Daniels Date of Service: 09/24/2021 11:30 AM Medical Record Number: [id number] Patient Account Number: [id number] Date of Birth/Gender: 29-Nov-1960 (61 y.o. F) Treating RN: Alycia Rossetti Primary Care Physician: Jonetta Osgood Other Clinician: Referring Physician: Jonetta Osgood Treating Physician/Extender: Skipper Cliche in Treatment: 2 Education Assessment Education Provided To: Patient Education Topics Provided Welcome To [facility]: Handouts: Welcome To [facility] [place] Wound/Skin Impairment: Handouts: Caring for Your Ulcer Methods: Demonstration, Explain/Verbal Responses: State content correctly Electronic Signature(s) Signed: 09/24/2021 2:59:21 PM By: Alycia Rossetti Entered By: Alycia Rossetti on 09/24/2021 12:55:56 Morgan Daniels ([id number]) -------------------------------------------------------------------------------- Wound Assessment Details Patient Name: Morgan Daniels Date of Service: 09/24/2021 11:30 AM Medical Record Number: [id number] Patient Account Number: [id number] Date of Birth/Sex: May 08, 1960 (61 y.o. F) Treating RN: Alycia Rossetti Primary Care Aeron Lheureux: Jonetta Osgood Other Clinician: Referring Freddie Dymek: Jonetta Osgood Treating Aleta Manternach/Extender: Skipper Cliche in Treatment: 2 Wound Status Wound Number: 1 Primary Diabetic Wound/Ulcer of the Lower Extremity Etiology: Wound Location: Left Calcaneus Wound Open Wounding Event: Gradually Appeared Status: Date Acquired: 04/17/2021 Comorbid Coronary Artery Disease, Hypertension, Myocardial Weeks Of Treatment: 2 History: Infarction, Type II Diabetes, Neuropathy Clustered Wound: No Photos Wound Measurements Length: (cm) 1.2 Width: (cm) 2 Depth: (cm) 0.1 Area: (cm) 1.885 Volume: (cm) 0.188 % Reduction in Area: -33.3% % Reduction in Volume: -33.3% Epithelialization: None Wound Description Classification: Unable to visualize wound bed Wound Margin: Flat and Intact Exudate Amount: None Present Foul Odor After Cleansing: No Slough/Fibrino No Wound Bed Granulation Amount: None Present (0%) Exposed Structure Necrotic Amount: Large (67-100%) Fascia Exposed: No Necrotic Quality: Eschar Fat Layer (Subcutaneous Tissue) Exposed: No Tendon Exposed:  No Muscle Exposed: No Joint Exposed: No Bone Exposed:  No Treatment Notes Wound #1 (Calcaneus) Wound Laterality: Left Cleanser [place] Ancillary Kit - 15 Day Supply Discharge Instruction: Use supplies as instructed; Kit contains: (15) Saline Bullets; (15) 3x3 Gauze; 15 pr Gloves Peri-Wound Care Topical MATAYAH, REYBURN ([id number]) Santyl Collagenase Ointment, 30 (gm), tube Discharge Instruction: apply nickel thick to wound bed only Primary Dressing Gauze Discharge Instruction: As directed: dry, moistened with saline or moistened with [place] Roll-Medium Discharge Instruction: [place] as directed Secured With Sims Soft Cloth Surgical Tape, 2x2 (in/yd) Compression Wrap Compression Stockings Add-Ons Electronic Signature(s) Signed: 09/24/2021 2:59:21 PM By: Alycia Rossetti Entered By: Alycia Rossetti on 09/24/2021 11:54:59 Morgan Daniels ([id number]) -------------------------------------------------------------------------------- Vitals Details Patient Name: Morgan Daniels Date of Service: 09/24/2021 11:30 AM Medical Record Number: [id number] Patient Account Number: [id number] Date of Birth/Sex: 1960-11-02 (61 y.o. F) Treating RN: Alycia Rossetti Primary Care Stacyann Mcconaughy: Jonetta Osgood Other Clinician: Referring Andretta Ergle: Jonetta Osgood Treating Ronell Boldin/Extender: Skipper Cliche in Treatment: 2 Vital Signs Time Taken: 11:40 Temperature (F): 98.2 Height (in): 67 Pulse (bpm): 71 Weight (lbs): 200 Respiratory Rate (breaths/min): 16 Body Mass Index (BMI): 31.3 Blood Pressure (mmHg): 165/89 Reference Range: 80 - 120 mg / dl Electronic Signature(s) Signed: 09/24/2021 2:59:21 PM By: Alycia Rossetti Entered By: Alycia Rossetti on 09/24/2021 11:46:32

## 2021-09-30 ENCOUNTER — Other Ambulatory Visit: Payer: Self-pay | Admitting: "Endocrinology

## 2021-09-30 DIAGNOSIS — E1142 Type 2 diabetes mellitus with diabetic polyneuropathy: Secondary | ICD-10-CM

## 2021-10-01 ENCOUNTER — Encounter: Payer: Medicare Other | Admitting: Physician Assistant

## 2021-10-01 DIAGNOSIS — L97428 Non-pressure chronic ulcer of left heel and midfoot with other specified severity: Secondary | ICD-10-CM | POA: Diagnosis not present

## 2021-10-01 DIAGNOSIS — E7800 Pure hypercholesterolemia, unspecified: Secondary | ICD-10-CM | POA: Diagnosis not present

## 2021-10-01 DIAGNOSIS — E1151 Type 2 diabetes mellitus with diabetic peripheral angiopathy without gangrene: Secondary | ICD-10-CM | POA: Diagnosis not present

## 2021-10-01 DIAGNOSIS — E11621 Type 2 diabetes mellitus with foot ulcer: Secondary | ICD-10-CM | POA: Diagnosis not present

## 2021-10-01 DIAGNOSIS — E1142 Type 2 diabetes mellitus with diabetic polyneuropathy: Secondary | ICD-10-CM | POA: Diagnosis not present

## 2021-10-01 DIAGNOSIS — L97528 Non-pressure chronic ulcer of other part of left foot with other specified severity: Secondary | ICD-10-CM | POA: Diagnosis not present

## 2021-10-02 NOTE — Progress Notes (Addendum)
Morgan, Daniels ([id number]) Visit Report for 10/01/2021 Chief Complaint Document Details Patient Name: Morgan, Daniels Date of Service: 10/01/2021 11:30 AM Medical Record Number: [id number] Patient Account Number: [id number] Date of Birth/Sex: September 04, 1960 (61 y.o. F) Treating RN: Alycia Rossetti Primary Care Provider: Jonetta Osgood Other Clinician: Referring Provider: Jonetta Osgood Treating Provider/Extender: Skipper Cliche in Treatment: 3 Information Obtained from: Patient Chief Complaint 09/11/2022; patient comes in for review of an area on the tip of her left heel Electronic Signature(s) Signed: 10/01/2021 11:47:02 AM By: Worthy Keeler PA-C Entered By: Worthy Keeler on 10/01/2021 11:47:02 BERKLEIGH, BECKLES ([id number]) -------------------------------------------------------------------------------- HPI Details Patient Name: Morgan Daniels Date of Service: 10/01/2021 11:30 AM Medical Record Number: [id number] Patient Account Number: [id number] Date of Birth/Sex: Feb 13, 1961 (61 y.o. F) Treating RN: Alycia Rossetti Primary Care Provider: Jonetta Osgood Other Clinician: Referring Provider: Jonetta Osgood Treating Provider/Extender: Skipper Cliche in Treatment: 3 History of Present Illness HPI Description: ADMISSION 09/10/2021 This is a 61 year old woman who arrived in clinic accompanied by her husband. She is here for review of a area on the tip of her left heel which has been present by her description for about 3 months. I did not see any description of this in care everywhere. She is a type II diabetic with peripheral arterial disease and peripheral neuropathy. In fact she has had 2 procedures by Dr. Lucky Cowboy of vein and vascular 1 on 01/03/2021 at which time she had a angioplasty of the left anterior tibial artery and a mechanical thrombectomy of the left SFA and proximal popliteal. She underwent that she then underwent an angioplasty of the left SFA and pop and proximal  popliteal and a stent in the left SFA. On 02/28/2021 she again went underwent a mechanical thrombectomy of the left SFA and popliteal arteries and a stent placement in the left SFA and proximal popliteal as well as reangioplasty of the left anterior tibial. The patient states that the area on the left heel started about 3 months ago it is scabbed over and she cannot get this to heal. She is using a foot cream that was given to her by podiatry [foot miracle]. She also has an area on the right heel medially almost mirror image to the left although there is no open wound here the skin in the area is dry and scaly. There is no evidence of infection Past medical history includes type 2 diabetes with PAD and peripheral neuropathy, coronary artery disease status post CABG x4, depression, hypertension, hypercholesterolemia, irritable bowel, carotid artery stenosis 09-24-2021 upon evaluation today patient appears to be doing well currently in regard to her wound as far as infection is concerned there is no signs of active infection at this time which is great news. With that being said she is still having significant pain here. Unfortunately I think this is more neuropathic in nature and is not to be a whole lot to do for this except for to get the wound healed so that there is no source of irritation and inflammation. With that being said I do believe that we need to perform debridement to do this will probably need to numb her. 10-01-2021 upon evaluation today patient appears to be doing well currently in regard to her heel ulcer. This is actually significantly improved compared to last week's visit. I am very pleased with where things stand and I do not think there is any evidence of active infection at this time which is great news. No fevers,  chills, nausea, vomiting, or diarrhea. Electronic Signature(s) Signed: 10/01/2021 1:00:35 PM By: Worthy Keeler PA-C Entered By: Worthy Keeler on 10/01/2021  13:00:35 Morgan, Daniels ([id number]) -------------------------------------------------------------------------------- Physical Exam Details Patient Name: Morgan Daniels Date of Service: 10/01/2021 11:30 AM Medical Record Number: [id number] Patient Account Number: [id number] Date of Birth/Sex: 1960/11/10 (61 y.o. F) Treating RN: Alycia Rossetti Primary Care Provider: Jonetta Osgood Other Clinician: Referring Provider: Jonetta Osgood Treating Provider/Extender: Skipper Cliche in Treatment: 3 Constitutional Well-nourished and well-hydrated in no acute distress. Respiratory normal breathing without difficulty. Psychiatric this patient is able to make decisions and demonstrates good insight into disease process. Alert and Oriented x 3. pleasant and cooperative. Notes Upon inspection patient's wound bed actually showed signs of good granulation and epithelization at this point. Fortunately I do not see any signs of active infection locally or systemically which is great news. No fevers, chills, nausea, vomiting, or diarrhea. Electronic Signature(s) Signed: 10/01/2021 1:00:51 PM By: Worthy Keeler PA-C Entered By: Worthy Keeler on 10/01/2021 13:00:50 Morgan Daniels ([id number]) -------------------------------------------------------------------------------- Physician Orders Details Patient Name: Morgan Daniels Date of Service: 10/01/2021 11:30 AM Medical Record Number: [id number] Patient Account Number: [id number] Date of Birth/Sex: 12/30/1960 (61 y.o. F) Treating RN: Alycia Rossetti Primary Care Provider: Jonetta Osgood Other Clinician: Referring Provider: Jonetta Osgood Treating Provider/Extender: Skipper Cliche in Treatment: 3 Verbal / Phone Orders: No Diagnosis Coding ICD-10 Coding Code Description E11.621 Type 2 diabetes mellitus with foot ulcer L97.528 Non-pressure chronic ulcer of other part of left foot with other specified severity E11.51 Type 2 diabetes  mellitus with diabetic peripheral angiopathy without gangrene E11.42 Type 2 diabetes mellitus with diabetic polyneuropathy Follow-up Appointments o Return Appointment in 2 weeks. Bathing/ Shower/ Hygiene o May shower; gently cleanse wound with antibacterial soap, rinse and pat dry prior to dressing wounds Anesthetic (Use 'Patient Medications' Section for Anesthetic Order Entry) o Lidocaine applied to wound bed Off-Loading Wound #1 Left Calcaneus o Open toe surgical shoe Wound Treatment Wound #1 - Calcaneus Wound Laterality: Left Cleanser: Byram Ancillary Kit - 15 Day Supply (DME) (Generic) 1 x Per Day/30 Days Discharge Instructions: Use supplies as instructed; Kit contains: (15) Saline Bullets; (15) 3x3 Gauze; 15 pr Gloves Topical: Santyl Collagenase Ointment, 30 (gm), tube 1 x Per Day/30 Days Discharge Instructions: apply nickel thick to wound bed only Primary Dressing: Gauze (DME) (Generic) 1 x Per Day/30 Days Discharge Instructions: As directed: dry, moistened with saline or moistened with Dakins Solution Secondary Dressing: ABD Pad 5x9 (in/in) (DME) (Generic) 1 x Per Day/30 Days Discharge Instructions: Cover with ABD pad Secondary Dressing: Kerlix 4.5 x 4.1 (in/yd) (DME) (Generic) 1 x Per Day/30 Days Discharge Instructions: Apply Kerlix 4.5 x 4.1 (in/yd) as instructed Secured With: Medipore Tape - 67M Medipore H Soft Cloth Surgical Tape, 2x2 (in/yd) (DME) (Generic) 1 x Per Day/30 Days Electronic Signature(s) Signed: 10/17/2021 5:40:47 PM By: Worthy Keeler PA-C Signed: 11/04/2021 2:32:45 PM By: Carlene Coria RN Previous Signature: 10/01/2021 4:17:10 PM Version By: Alycia Rossetti Previous Signature: 10/03/2021 5:21:19 PM Version By: Worthy Keeler PA-C Entered By: Carlene Coria on 10/08/2021 10:56:33 Morgan Daniels ([id number]) -------------------------------------------------------------------------------- Problem List Details Patient Name: Morgan Daniels Date of Service:  10/01/2021 11:30 AM Medical Record Number: [id number] Patient Account Number: [id number] Date of Birth/Sex: 12/31/1960 (61 y.o. F) Treating RN: Alycia Rossetti Primary Care Provider: Jonetta Osgood Other Clinician: Referring Provider: Jonetta Osgood Treating Provider/Extender: Skipper Cliche in Treatment: 3 Active Problems ICD-10 Encounter Code Description  Active Date MDM Diagnosis E11.621 Type 2 diabetes mellitus with foot ulcer 09/10/2021 No Yes L97.528 Non-pressure chronic ulcer of other part of left foot with other specified 09/10/2021 No Yes severity E11.51 Type 2 diabetes mellitus with diabetic peripheral angiopathy without 09/10/2021 No Yes gangrene E11.42 Type 2 diabetes mellitus with diabetic polyneuropathy 09/10/2021 No Yes Inactive Problems Resolved Problems Electronic Signature(s) Signed: 10/01/2021 11:47:00 AM By: Worthy Keeler PA-C Entered By: Worthy Keeler on 10/01/2021 11:47:00 Morgan Daniels ([id number]) -------------------------------------------------------------------------------- Progress Note Details Patient Name: Morgan Daniels Date of Service: 10/01/2021 11:30 AM Medical Record Number: [id number] Patient Account Number: [id number] Date of Birth/Sex: 04-Apr-1960 (61 y.o. F) Treating RN: Alycia Rossetti Primary Care Provider: Jonetta Osgood Other Clinician: Referring Provider: Jonetta Osgood Treating Provider/Extender: Skipper Cliche in Treatment: 3 Subjective Chief Complaint Information obtained from Patient 09/11/2022; patient comes in for review of an area on the tip of her left heel History of Present Illness (HPI) ADMISSION 09/10/2021 This is a 61 year old woman who arrived in clinic accompanied by her husband. She is here for review of a area on the tip of her left heel which has been present by her description for about 3 months. I did not see any description of this in care everywhere. She is a type II diabetic with peripheral arterial  disease and peripheral neuropathy. In fact she has had 2 procedures by Dr. Lucky Cowboy of vein and vascular 1 on 01/03/2021 at which time she had a angioplasty of the left anterior tibial artery and a mechanical thrombectomy of the left SFA and proximal popliteal. She underwent that she then underwent an angioplasty of the left SFA and pop and proximal popliteal and a stent in the left SFA. On 02/28/2021 she again went underwent a mechanical thrombectomy of the left SFA and popliteal arteries and a stent placement in the left SFA and proximal popliteal as well as reangioplasty of the left anterior tibial. The patient states that the area on the left heel started about 3 months ago it is scabbed over and she cannot get this to heal. She is using a foot cream that was given to her by podiatry [foot miracle]. She also has an area on the right heel medially almost mirror image to the left although there is no open wound here the skin in the area is dry and scaly. There is no evidence of infection Past medical history includes type 2 diabetes with PAD and peripheral neuropathy, coronary artery disease status post CABG x4, depression, hypertension, hypercholesterolemia, irritable bowel, carotid artery stenosis 09-24-2021 upon evaluation today patient appears to be doing well currently in regard to her wound as far as infection is concerned there is no signs of active infection at this time which is great news. With that being said she is still having significant pain here. Unfortunately I think this is more neuropathic in nature and is not to be a whole lot to do for this except for to get the wound healed so that there is no source of irritation and inflammation. With that being said I do believe that we need to perform debridement to do this will probably need to numb her. 10-01-2021 upon evaluation today patient appears to be doing well currently in regard to her heel ulcer. This is actually significantly  improved compared to last week's visit. I am very pleased with where things stand and I do not think there is any evidence of active infection at this time which is great news.  No fevers, chills, nausea, vomiting, or diarrhea. Objective Constitutional Well-nourished and well-hydrated in no acute distress. Vitals Time Taken: 11:40 AM, Height: 67 in, Weight: 200 lbs, BMI: 31.3, Temperature: 98.7 F, Pulse: 82 bpm, Respiratory Rate: 16 breaths/min, Blood Pressure: 176/111 mmHg. General Notes: Patient on multiple blood pressure medications. RN suggested speaking with PCP about adjusting medications. Respiratory normal breathing without difficulty. Psychiatric this patient is able to make decisions and demonstrates good insight into disease process. Alert and Oriented x 3. pleasant and cooperative. General Notes: Upon inspection patient's wound bed actually showed signs of good granulation and epithelization at this point. Fortunately I do not see any signs of active infection locally or systemically which is great news. No fevers, chills, nausea, vomiting, or diarrhea. Integumentary (Hair, Skin) Wound #1 status is Open. Original cause of wound was Gradually Appeared. The date acquired was: 04/17/2021. The wound has been in treatment 3 weeks. The wound is located on the Left Calcaneus. The wound measures 1cm length x 1cm width x 0.1cm depth; 0.785cm^2 area and Morgan Daniels, [name] ([id number]) 0.079cm^3 volume. There is Fat Layer (Subcutaneous Tissue) exposed. There is a medium amount of serosanguineous drainage noted. The wound margin is distinct with the outline attached to the wound base. There is medium (34-66%) pink granulation within the wound bed. There is a medium (34-66%) amount of necrotic tissue within the wound bed including Adherent Slough. Assessment Active Problems ICD-10 Type 2 diabetes mellitus with foot ulcer Non-pressure chronic ulcer of other part of left foot with other specified  severity Type 2 diabetes mellitus with diabetic peripheral angiopathy without gangrene Type 2 diabetes mellitus with diabetic polyneuropathy Plan Follow-up Appointments: Return Appointment in 2 weeks. Bathing/ Shower/ Hygiene: May shower; gently cleanse wound with antibacterial soap, rinse and pat dry prior to dressing wounds Anesthetic (Use 'Patient Medications' Section for Anesthetic Order Entry): Lidocaine applied to wound bed Off-Loading: Wound #1 Left Calcaneus: Open toe surgical shoe WOUND #1: - Calcaneus Wound Laterality: Left Cleanser: Byram Ancillary Kit - 15 Day Supply (DME) (Generic) 1 x Per Day/30 Days Discharge Instructions: Use supplies as instructed; Kit contains: (15) Saline Bullets; (15) 3x3 Gauze; 15 pr Gloves Topical: Santyl Collagenase Ointment, 30 (gm), tube 1 x Per Day/30 Days Discharge Instructions: apply nickel thick to wound bed only Primary Dressing: Gauze (DME) (Generic) 1 x Per Day/30 Days Discharge Instructions: As directed: dry, moistened with saline or moistened with Dakins Solution Secondary Dressing: ABD Pad 5x9 (in/in) (DME) (Generic) 1 x Per Day/30 Days Discharge Instructions: Cover with ABD pad Secondary Dressing: Kerlix 4.5 x 4.1 (in/yd) (DME) (Generic) 1 x Per Day/30 Days Discharge Instructions: Apply Kerlix 4.5 x 4.1 (in/yd) as instructed Secured With: Medipore Tape - 53M Medipore H Soft Cloth Surgical Tape, 2x2 (in/yd) (DME) (Generic) 1 x Per Day/30 Days 1. I would recommend that we going continue with wound care measures as before and the patient is in agreement with plan. This includes the use of the Santyl ointment which I think is doing a great job. 2. She should continue with the saline moistened gauze to cover. 3. I also am going to recommend she should be changing this daily. 4. I would also recommend that she continue to monitor for any signs of worsening or infection if anything changes she should let me know. We will see patient back for  reevaluation in 1 week here in the clinic. If anything worsens or changes patient will contact our office for additional recommendations. Electronic Signature(s) Signed: 10/10/2021 11:35:04 AM  By: Worthy Keeler PA-C Previous Signature: 10/01/2021 1:01:49 PM Version By: Worthy Keeler PA-C Entered By: Worthy Keeler on 10/10/2021 11:35:04 Morgan Daniels ([id number]) -------------------------------------------------------------------------------- SuperBill Details Patient Name: Morgan Daniels Date of Service: 10/01/2021 Medical Record Number: [id number] Patient Account Number: [id number] Date of Birth/Sex: 07-30-1960 (61 y.o. F) Treating RN: Alycia Rossetti Primary Care Provider: Jonetta Osgood Other Clinician: Referring Provider: Jonetta Osgood Treating Provider/Extender: Skipper Cliche in Treatment: 3 Diagnosis Coding ICD-10 Codes Code Description E11.621 Type 2 diabetes mellitus with foot ulcer L97.528 Non-pressure chronic ulcer of other part of left foot with other specified severity E11.51 Type 2 diabetes mellitus with diabetic peripheral angiopathy without gangrene E11.42 Type 2 diabetes mellitus with diabetic polyneuropathy Facility Procedures CPT4 Code: [id number] Description: 99213 - WOUND CARE VISIT-LEV 3 EST PT Modifier: Quantity: 1 Physician Procedures CPT4 Code: [id number] Description: 56701 - WC PHYS LEVEL 3 - EST PT Modifier: Quantity: 1 CPT4 Code: Description: ICD-10 Diagnosis Description E11.621 Type 2 diabetes mellitus with foot ulcer L97.528 Non-pressure chronic ulcer of other part of left foot with other specifie E11.51 Type 2 diabetes mellitus with diabetic peripheral angiopathy without gang  E11.42 Type 2 diabetes mellitus with diabetic polyneuropathy Modifier: d severity rene Quantity: Electronic Signature(s) Signed: 10/01/2021 1:02:00 PM By: Worthy Keeler PA-C Entered By: Worthy Keeler on 10/01/2021 13:01:59

## 2021-10-03 NOTE — Progress Notes (Signed)
Morgan Daniels, Morgan Daniels ([id number]) Visit Report for 10/01/2021 Arrival Information Details Patient Name: Morgan Daniels, Morgan Daniels Date of Service: 10/01/2021 11:30 AM Medical Record Number: [id number] Patient Account Number: [id number] Date of Birth/Sex: 09/27/1960 (61 y.o. F) Treating RN: Morgan Daniels Primary Care Morgan Daniels: Morgan Daniels Other Clinician: Referring Morgan Daniels: Morgan Daniels Treating Morgan Daniels/Extender: Morgan Daniels in Treatment: 3 Visit Information History Since Last Visit Added or deleted any medications: No Patient Arrived: Morgan Daniels Any new allergies or adverse reactions: No Arrival Time: 11:35 Had a fall or experienced change in No Transfer Assistance: None activities of daily living that may affect Patient Requires Transmission-Based No risk of falls: Precautions: Hospitalized since last visit: No Patient Has Alerts: Yes Pain Present Now: Yes Patient Alerts: Patient on Blood Thinner Type II Diabetic Aspirin 33m AVVS 04/01/20 ABI R 1.02 L 1.04 TBI R 0.89 L 0.87 Electronic Signature(s) Signed: 10/01/2021 3:01:57 PM By: CAlycia RossettiEntered By: CAlycia Rossettion 10/01/2021 15:01:56 TMargarita Daniels([id number] -------------------------------------------------------------------------------- Clinic Level of Care Assessment Details Patient Name: TMargarita RanaDate of Service: 10/01/2021 11:30 AM Medical Record Number: [id number] Account Number: [id number] of Birth/Sex: 3March 04, 1962(61 y.o. F) Treating RN: CAlycia RossettiPrimary Care Morgan Daniels: AJonetta OsgoodOther Clinician: Referring Morgan Daniels: AJonetta OsgoodTreating Morgan Daniels/Extender: SSkipper Clichein Treatment: 3 Clinic Level of Care Assessment Items TOOL 4 Quantity Score '[]'  - Use when only an EandM is performed on FOLLOW-UP visit 0 ASSESSMENTS - Nursing Assessment / Reassessment X - Reassessment of Co-morbidities (includes updates in patient status) 1 10 X- 1 5 Reassessment of Adherence to  Treatment Plan ASSESSMENTS - Wound and Skin Assessment / Reassessment X - Simple Wound Assessment / Reassessment - one wound 1 5 '[]'  - 0 Complex Wound Assessment / Reassessment - multiple wounds '[]'  - 0 Dermatologic / Skin Assessment (not related to wound area) ASSESSMENTS - Focused Assessment '[]'  - Circumferential Edema Measurements - multi extremities 0 '[]'  - 0 Nutritional Assessment / Counseling / Intervention '[]'  - 0 Lower Extremity Assessment (monofilament, tuning fork, pulses) '[]'  - 0 Peripheral Arterial Disease Assessment (using hand held doppler) ASSESSMENTS - Ostomy and/or Continence Assessment and Care '[]'  - Incontinence Assessment and Management 0 '[]'  - 0 Ostomy Care Assessment and Management (repouching, etc.) PROCESS - Coordination of Care X - Simple Patient / Family Education for ongoing care 1 15 X- 1 20 Complex (extensive) Patient / Family Education for ongoing care '[]'  - 0 Staff obtains CProgrammer, systems Records, Test Results / Process Orders '[]'  - 0 Staff telephones HHA, Nursing Homes / Clarify orders / etc '[]'  - 0 Routine Transfer to another Facility (non-emergent condition) '[]'  - 0 Routine Hospital Admission (non-emergent condition) '[]'  - 0 New Admissions / IBiomedical engineer/ Ordering NPWT, Apligraf, etc. '[]'  - 0 Emergency Hospital Admission (emergent condition) X- 1 10 Simple Discharge Coordination '[]'  - 0 Complex (extensive) Discharge Coordination PROCESS - Special Needs '[]'  - Pediatric / Minor Patient Management 0 '[]'  - 0 Isolation Patient Management '[]'  - 0 Hearing / Language / Visual special needs '[]'  - 0 Assessment of Community assistance (transportation, D/C planning, etc.) '[]'  - 0 Additional assistance / Altered mentation '[]'  - 0 Support Surface(s) Assessment (bed, cushion, seat, etc.) INTERVENTIONS - Wound Cleansing / Measurement [name], [name] ([id number] X- 1 5 Simple Wound Cleansing - one wound '[]'  - 0 Complex Wound Cleansing - multiple wounds X- 1  5 Wound Imaging (photographs - any number of wounds) '[]'  - 0 Wound Tracing (instead of photographs) '[]'  - 0 Simple  Wound Measurement - one wound '[]'  - 0 Complex Wound Measurement - multiple wounds INTERVENTIONS - Wound Dressings X - Small Wound Dressing one or multiple wounds 1 10 '[]'  - 0 Medium Wound Dressing one or multiple wounds '[]'  - 0 Large Wound Dressing one or multiple wounds '[]'  - 0 Application of Medications - topical '[]'  - 0 Application of Medications - injection INTERVENTIONS - Miscellaneous '[]'  - External ear exam 0 '[]'  - 0 Specimen Collection (cultures, biopsies, blood, body fluids, etc.) '[]'  - 0 Specimen(s) / Culture(s) sent or taken to Lab for analysis '[]'  - 0 Patient Transfer (multiple staff / Civil Service fast streamer / Similar devices) '[]'  - 0 Simple Staple / Suture removal (25 or less) '[]'  - 0 Complex Staple / Suture removal (26 or more) '[]'  - 0 Hypo / Hyperglycemic Management (close monitor of Blood Glucose) '[]'  - 0 Ankle / Brachial Index (ABI) - do not check if billed separately X- 1 5 Vital Signs Has the patient been seen at the hospital within the last three years: Yes Total Score: 90 Level Of Care: New/Established - Level 3 Electronic Signature(s) Signed: 10/01/2021 4:17:10 PM By: Morgan Daniels Entered By: Morgan Daniels on 10/01/2021 12:01:04 Morgan Daniels ([id number]) -------------------------------------------------------------------------------- Encounter Discharge Information Details Patient Name: Morgan Daniels Date of Service: 10/01/2021 11:30 AM Medical Record Number: [id number] Patient Account Number: [id number] Date of Birth/Sex: 1960/07/27 (61 y.o. F) Treating RN: Morgan Daniels Primary Care Morgan Daniels: Morgan Daniels Other Clinician: Referring Keali Mccraw: Morgan Daniels Treating Morgan Daniels/Extender: Morgan Daniels in Treatment: 3 Encounter Discharge Information Items Discharge Condition: Stable Ambulatory Status: Ambulatory Discharge Destination:  Home Transportation: Private Auto Accompanied By: self Schedule Follow-up Appointment: Yes Clinical Summary of Care: Electronic Signature(s) Signed: 10/01/2021 1:52:00 PM By: Morgan Daniels Entered By: Morgan Daniels on 10/01/2021 13:51:59 Morgan Daniels ([id number]) -------------------------------------------------------------------------------- Lower Extremity Assessment Details Patient Name: Morgan Daniels Date of Service: 10/01/2021 11:30 AM Medical Record Number: [id number] Patient Account Number: [id number] Date of Birth/Sex: 10-28-1960 (61 y.o. F) Treating RN: Morgan Daniels Primary Care Daric Koren: Morgan Daniels Other Clinician: Referring Briannah Lona: Morgan Daniels Treating Maricus Tanzi/Extender: Morgan Daniels in Treatment: 3 Edema Assessment Assessed: [Left: No] [Right: No] [Left: Edema] [Right: :] Calf Left: Right: Point of Measurement: 30 cm From Medial Instep 35.5 cm Ankle Left: Right: Point of Measurement: 10 cm From Medial Instep 20 cm Vascular Assessment Pulses: Dorsalis Pedis Palpable: [Left:Yes] Electronic Signature(s) Signed: 10/01/2021 4:17:10 PM By: Morgan Daniels Entered By: Morgan Daniels on 10/01/2021 11:51:15 Morgan Daniels ([id number]) -------------------------------------------------------------------------------- Multi Wound Chart Details Patient Name: Morgan Daniels Date of Service: 10/01/2021 11:30 AM Medical Record Number: [id number] Patient Account Number: [id number] Date of Birth/Sex: [birth year] (61 y.o. F) Treating RN: Morgan Daniels Primary Care Samon Dishner: Morgan Daniels Other Clinician: Referring Amberrose Friebel: Morgan Daniels Treating Oluwatosin Higginson/Extender: Morgan Daniels in Treatment: 3 Vital Signs Height(in): 86 Pulse(bpm): 27 Weight(lbs): 200 Blood Pressure(mmHg): 176/111 Body Mass Index(BMI): 31.3 Temperature(F): 98.7 Respiratory Rate(breaths/min): 16 Photos: [N/A:N/A] Wound Location: Left Calcaneus N/A N/A Wounding Event:  Gradually Appeared N/A N/A Primary Etiology: Diabetic Wound/Ulcer of the Lower N/A N/A Extremity Comorbid History: Coronary Artery Disease, N/A N/A Hypertension, Myocardial Infarction, Type II Diabetes, Neuropathy Date Acquired: 04/17/2021 N/A N/A Weeks of Treatment: 3 N/A N/A Wound Status: Open N/A N/A Wound Recurrence: No N/A N/A Measurements L x W x D (cm) 1x1x0.1 N/A N/A Area (cm) : 0.785 N/A N/A Volume (cm) : 0.079 N/A N/A % Reduction in Area: 44.50% N/A N/A % Reduction in Volume: 44.00% N/A N/A Classification:  Unable to visualize wound bed N/A N/A Exudate Amount: None Present N/A N/A Wound Margin: Flat and Intact N/A N/A Granulation Amount: Small (1-33%) N/A N/A Granulation Quality: Pink N/A N/A Necrotic Amount: Large (67-100%) N/A N/A Exposed Structures: Fascia: No N/A N/A Fat Layer (Subcutaneous Tissue): No Tendon: No Muscle: No Joint: No Bone: No Epithelialization: None N/A N/A Treatment Notes Electronic Signature(s) Signed: 10/01/2021 4:17:10 PM By: Morgan Daniels Entered By: Morgan Daniels on 10/01/2021 11:51:28 Morgan Daniels ([id number]) -------------------------------------------------------------------------------- [place] Details Patient Name: Morgan Daniels Date of Service: 10/01/2021 11:30 AM Medical Record Number: [id number] Patient Account Number: [id number] Date of Birth/Sex: 06-25-1960 (61 y.o. F) Treating RN: Morgan Daniels Primary Care Dominick Zertuche: Morgan Daniels Other Clinician: Referring Almetta Liddicoat: Morgan Daniels Treating Devanta Daniel/Extender: Morgan Daniels in Treatment: 3 Active Inactive Necrotic Tissue Nursing Diagnoses: Impaired tissue integrity related to necrotic/devitalized tissue Knowledge deficit related to management of necrotic/devitalized tissue Goals: Necrotic/devitalized tissue will be minimized in the wound bed Date Initiated: 09/10/2021 Target Resolution Date: 09/10/2021 Goal Status:  Active Patient/caregiver will verbalize understanding of reason and process for debridement of necrotic tissue Date Initiated: 09/10/2021 Target Resolution Date: 09/10/2021 Goal Status: Active Interventions: Assess patient pain level pre-, during and post procedure and prior to discharge Provide education on necrotic tissue and debridement process Treatment Activities: Excisional debridement : 09/10/2021 Notes: Orientation to the Wound Care Program Nursing Diagnoses: Knowledge deficit related to the wound healing center program Goals: Patient/caregiver will verbalize understanding of the [place] Date Initiated: 09/10/2021 Target Resolution Date: 09/10/2021 Goal Status: Active Interventions: Provide education on orientation to the wound center Notes: Wound/Skin Impairment Nursing Diagnoses: Impaired tissue integrity Goals: Patient/caregiver will verbalize understanding of skin care regimen Date Initiated: 09/10/2021 Target Resolution Date: 09/10/2021 Goal Status: Active Ulcer/skin breakdown will have a volume reduction of 30% by week 4 Date Initiated: 09/10/2021 Target Resolution Date: 10/08/2021 Goal Status: Active Interventions: Assess patient/caregiver ability to obtain necessary supplies Assess patient/caregiver ability to perform ulcer/skin care regimen upon admission and as needed Morgan Daniels, Morgan Daniels ([id number]) Assess ulceration(s) every visit Treatment Activities: Skin care regimen initiated : 09/10/2021 Topical wound management initiated : 09/10/2021 Notes: Electronic Signature(s) Signed: 10/01/2021 4:17:10 PM By: Morgan Daniels Entered By: Morgan Daniels on 10/01/2021 11:51:21 Morgan Daniels ([id number]) -------------------------------------------------------------------------------- Pain Assessment Details Patient Name: Morgan Daniels Date of Service: 10/01/2021 11:30 AM Medical Record Number: [id number] Patient Account Number: [id number] Date of  Birth/Sex: [birth year] (61 y.o. F) Treating RN: Morgan Daniels Primary Care Sheena Donegan: Morgan Daniels Other Clinician: Referring Makailee Nudelman: Morgan Daniels Treating Zakkery Dorian/Extender: Morgan Daniels in Treatment: 3 Active Problems Location of Pain Severity and Description of Pain Patient Has Paino Yes Site Locations Rate the pain. Current Pain Level: 6 Pain Management and Medication Current Pain Management: Electronic Signature(s) Signed: 10/01/2021 4:17:10 PM By: Morgan Daniels Entered By: Morgan Daniels on 10/01/2021 11:40:31 Morgan Daniels ([id number]) -------------------------------------------------------------------------------- Patient/Caregiver Education Details Patient Name: Morgan Daniels Date of Service: 10/01/2021 11:30 AM Medical Record Number: [id number] Patient Account Number: [id number] Date of Birth/Gender: Feb 04, 1961 (61 y.o. F) Treating RN: Morgan Daniels Primary Care Physician: Morgan Daniels Other Clinician: Referring Physician: Jonetta Daniels Treating Physician/Extender: Morgan Daniels in Treatment: 3 Education Assessment Education Provided To: Patient Education Topics Provided Wound/Skin Impairment: Handouts: Caring for Your Ulcer Methods: Demonstration, Explain/Verbal Responses: State content correctly Electronic Signature(s) Signed: 10/01/2021 4:17:10 PM By: Morgan Daniels Entered By: Morgan Daniels on 10/01/2021 12:01:40 Morgan Daniels ([id number]) -------------------------------------------------------------------------------- Wound Assessment Details Patient Name: Morgan Daniels. Date  of Service: 10/01/2021 11:30 AM Medical Record Number: [id number] Patient Account Number: [id number] Date of Birth/Sex: 24-Jan-1961 (61 y.o. F) Treating RN: Morgan Daniels Primary Care Nayib Remer: Morgan Daniels Other Clinician: Referring Candiss Galeana: Morgan Daniels Treating Ridhi Hoffert/Extender: Morgan Daniels in Treatment: 3 Wound Status Wound Number: 1  Primary Diabetic Wound/Ulcer of the Lower Extremity Etiology: Wound Location: Left Calcaneus Wound Open Wounding Event: Gradually Appeared Status: Date Acquired: 04/17/2021 Comorbid Coronary Artery Disease, Hypertension, Myocardial Weeks Of Treatment: 3 History: Infarction, Type II Diabetes, Neuropathy Clustered Wound: No Photos Wound Measurements Length: (cm) 1 Width: (cm) 1 Depth: (cm) 0.1 Area: (cm) 0.785 Volume: (cm) 0.079 % Reduction in Area: 44.5% % Reduction in Volume: 44% Epithelialization: None Wound Description Classification: Unable to visualize wound bed Wound Margin: Flat and Intact Exudate Amount: None Present Foul Odor After Cleansing: No Slough/Fibrino No Wound Bed Granulation Amount: Small (1-33%) Exposed Structure Granulation Quality: Pink Fascia Exposed: No Necrotic Amount: Large (67-100%) Fat Layer (Subcutaneous Tissue) Exposed: No Necrotic Quality: Adherent Slough Tendon Exposed: No Muscle Exposed: No Joint Exposed: No Bone Exposed: No Treatment Notes Wound #1 (Calcaneus) Wound Laterality: Left Cleanser [place] Ancillary Kit - 15 Day Supply Discharge Instruction: Use supplies as instructed; Kit contains: (15) Saline Bullets; (15) 3x3 Gauze; 15 pr Gloves Peri-Wound Care Topical Morgan Daniels, Morgan Daniels ([id number]) Santyl Collagenase Ointment, 30 (gm), tube Discharge Instruction: apply nickel thick to wound bed only Primary Dressing Gauze Discharge Instruction: As directed: dry, moistened with saline or moistened with [place] Roll-Medium Discharge Instruction: Fate as directed Secured With Medipore Tape - 69M Medipore H Soft Cloth Surgical Tape, 2x2 (in/yd) Compression Wrap Compression Stockings Add-Ons Electronic Signature(s) Signed: 10/01/2021 4:17:10 PM By: Morgan Daniels Entered By: Morgan Daniels on 10/01/2021 11:49:20 Morgan Daniels  ([id number]) -------------------------------------------------------------------------------- Vitals Details Patient Name: Morgan Daniels Date of Service: 10/01/2021 11:30 AM Medical Record Number: [id number] Patient Account Number: [id number] Date of Birth/Sex: Nov 05, 1960 (61 y.o. F) Treating RN: Morgan Daniels Primary Care Giovanni Bath: Morgan Daniels Other Clinician: Referring Areal Cochrane: Morgan Daniels Treating Phillippa Straub/Extender: Morgan Daniels in Treatment: 3 Vital Signs Time Taken: 11:40 Temperature (F): 98.7 Height (in): 67 Pulse (bpm): 82 Weight (lbs): 200 Respiratory Rate (breaths/min): 16 Body Mass Index (BMI): 31.3 Blood Pressure (mmHg): 176/111 Reference Range: 80 - 120 mg / dl Notes Patient on multiple blood pressure medications. RN suggested speaking with PCP about adjusting medications. Electronic Signature(s) Signed: 10/01/2021 4:17:10 PM By: Morgan Daniels Entered By: Morgan Daniels on 10/01/2021 11:40:22

## 2021-10-05 ENCOUNTER — Other Ambulatory Visit: Payer: Self-pay | Admitting: Nurse Practitioner

## 2021-10-05 DIAGNOSIS — M7540 Impingement syndrome of unspecified shoulder: Secondary | ICD-10-CM

## 2021-10-08 ENCOUNTER — Telehealth: Payer: Self-pay

## 2021-10-08 NOTE — Telephone Encounter (Signed)
Faxed A1c results to Dr. Ronnald Collum for endocrinology referral-Toni

## 2021-10-09 ENCOUNTER — Other Ambulatory Visit: Payer: Self-pay | Admitting: Nurse Practitioner

## 2021-10-11 ENCOUNTER — Encounter: Payer: Self-pay | Admitting: Internal Medicine

## 2021-10-13 ENCOUNTER — Other Ambulatory Visit: Payer: Self-pay | Admitting: "Endocrinology

## 2021-10-13 DIAGNOSIS — E1142 Type 2 diabetes mellitus with diabetic polyneuropathy: Secondary | ICD-10-CM

## 2021-10-15 ENCOUNTER — Encounter: Payer: Medicare Other | Admitting: Physician Assistant

## 2021-10-15 DIAGNOSIS — L97528 Non-pressure chronic ulcer of other part of left foot with other specified severity: Secondary | ICD-10-CM | POA: Diagnosis not present

## 2021-10-15 DIAGNOSIS — E7800 Pure hypercholesterolemia, unspecified: Secondary | ICD-10-CM | POA: Diagnosis not present

## 2021-10-15 DIAGNOSIS — L97422 Non-pressure chronic ulcer of left heel and midfoot with fat layer exposed: Secondary | ICD-10-CM | POA: Diagnosis not present

## 2021-10-15 DIAGNOSIS — E1151 Type 2 diabetes mellitus with diabetic peripheral angiopathy without gangrene: Secondary | ICD-10-CM | POA: Diagnosis not present

## 2021-10-15 DIAGNOSIS — E11621 Type 2 diabetes mellitus with foot ulcer: Secondary | ICD-10-CM | POA: Diagnosis not present

## 2021-10-15 DIAGNOSIS — E1142 Type 2 diabetes mellitus with diabetic polyneuropathy: Secondary | ICD-10-CM | POA: Diagnosis not present

## 2021-10-15 NOTE — Progress Notes (Signed)
Morgan, Daniels ([id number]) Visit Report for 10/15/2021 Arrival Information Details Patient Name: Morgan Daniels, Morgan Daniels Date of Service: 10/15/2021 10:00 AM Medical Record Number: [id number] Patient Account Number: [id number] Date of Birth/Sex: 1960/06/23 (61 y.o. F) Treating RN: Cornell Barman Primary Care Babygirl Trager: Jonetta Osgood Other Clinician: Referring Clarann Helvey: Jonetta Osgood Treating Tylyn Derwin/Extender: Skipper Cliche in Treatment: 5 Visit Information History Since Last Visit Added or deleted any medications: No Patient Arrived: Ambulatory Has Dressing in Place as Prescribed: Yes Arrival Time: 10:27 Pain Present Now: No Accompanied By: husband Transfer Assistance: None Patient Identification Verified: Yes Secondary Verification Process Completed: Yes Patient Requires Transmission-Based No Precautions: Patient Has Alerts: Yes Patient Alerts: Patient on Blood Thinner Type II Diabetic Aspirin $RemoveBeforeDE'81mg'efLWkBaPusQgnTe$  AVVS 04/01/20 ABI R 1.02 L 1.04 TBI R 0.89 L 0.87 Electronic Signature(s) Signed: 10/15/2021 2:53:55 PM By: Gretta Cool, BSN, RN, CWS, Kim RN, BSN Entered By: Gretta Cool, BSN, RN, CWS, Kim on 10/15/2021 10:28:12 Morgan Daniels ([id number]) -------------------------------------------------------------------------------- Encounter Discharge Information Details Patient Name: Morgan Daniels Date of Service: 10/15/2021 10:00 AM Medical Record Number: [id number] Patient Account Number: [id number] Date of Birth/Sex: 1960-04-11 (61 y.o. F) Treating RN: Cornell Barman Primary Care Keyunna Coco: Jonetta Osgood Other Clinician: Referring Omar Gayden: Jonetta Osgood Treating Topaz Raglin/Extender: Skipper Cliche in Treatment: 5 Encounter Discharge Information Items Discharge Condition: Stable Ambulatory Status: Ambulatory Discharge Destination: Home Transportation: Private Auto Accompanied By: husband Schedule Follow-up Appointment: Yes Clinical Summary of Care: Electronic Signature(s) Signed:  10/15/2021 2:53:55 PM By: Gretta Cool, BSN, RN, CWS, Kim RN, BSN Entered By: Gretta Cool, BSN, RN, CWS, Kim on 10/15/2021 10:55:00 Morgan Daniels ([id number]) -------------------------------------------------------------------------------- Lower Extremity Assessment Details Patient Name: Morgan Daniels Date of Service: 10/15/2021 10:00 AM Medical Record Number: [id number] Patient Account Number: [id number] Date of Birth/Sex: April 16, 1960 (61 y.o. F) Treating RN: Cornell Barman Primary Care Bunny Kleist: Jonetta Osgood Other Clinician: Referring Tadeusz Stahl: Jonetta Osgood Treating Giancarlo Askren/Extender: Skipper Cliche in Treatment: 5 Edema Assessment Assessed: [Left: No] [Right: No] Edema: [Left: N] [Right: o] Vascular Assessment Pulses: Dorsalis Pedis Palpable: [Left:Yes] Electronic Signature(s) Signed: 10/15/2021 2:53:55 PM By: Gretta Cool, BSN, RN, CWS, Kim RN, BSN Entered By: Gretta Cool, BSN, RN, CWS, Kim on 10/15/2021 10:36:18 Morgan Daniels ([id number]) -------------------------------------------------------------------------------- Multi Wound Chart Details Patient Name: Morgan Daniels Date of Service: 10/15/2021 10:00 AM Medical Record Number: [id number] Patient Account Number: [id number] Date of Birth/Sex: 22-Sep-1960 (61 y.o. F) Treating RN: Cornell Barman Primary Care Candis Kabel: Jonetta Osgood Other Clinician: Referring Baker Kogler: Jonetta Osgood Treating Eunie Lawn/Extender: Skipper Cliche in Treatment: 5 Vital Signs Height(in): 67 Pulse(bpm): 29 Weight(lbs): 200 Blood Pressure(mmHg): 181/96 Body Mass Index(BMI): 31.3 Temperature(F): 99.1 Respiratory Rate(breaths/min): 16 Photos: [N/A:N/A] Wound Location: Left Calcaneus N/A N/A Wounding Event: Gradually Appeared N/A N/A Primary Etiology: Diabetic Wound/Ulcer of the Lower N/A N/A Extremity Comorbid History: Coronary Artery Disease, N/A N/A Hypertension, Myocardial Infarction, Type II Diabetes, Neuropathy Date Acquired: 04/17/2021 N/A  N/A Weeks of Treatment: 5 N/A N/A Wound Status: Open N/A N/A Wound Recurrence: No N/A N/A Measurements L x W x D (cm) 0.7x0.6x0.2 N/A N/A Area (cm) : 0.33 N/A N/A Volume (cm) : 0.066 N/A N/A % Reduction in Area: 76.70% N/A N/A % Reduction in Volume: 53.20% N/A N/A Classification: Grade 1 N/A N/A Exudate Amount: Medium N/A N/A Exudate Type: Serosanguineous N/A N/A Exudate Color: red, brown N/A N/A Wound Margin: Distinct, outline attached N/A N/A Granulation Amount: Small (1-33%) N/A N/A Granulation Quality: Pink N/A N/A Necrotic Amount: Large (67-100%) N/A N/A Exposed Structures: Fat Layer (Subcutaneous Tissue): N/A N/A Yes Fascia:  No Tendon: No Muscle: No Joint: No Bone: No Epithelialization: None N/A N/A Treatment Notes Electronic Signature(s) Signed: 10/15/2021 2:53:55 PM By: Gretta Cool, BSN, RN, CWS, Kim RN, BSN Entered By: Gretta Cool, BSN, RN, CWS, Kim on 10/15/2021 10:43:41 RAIMA, GEATHERS ([id number]) BO, ROGUE ([id number]) -------------------------------------------------------------------------------- Multi-Disciplinary Care Plan Details Patient Name: Morgan Daniels Date of Service: 10/15/2021 10:00 AM Medical Record Number: [id number] Patient Account Number: [id number] Date of Birth/Sex: Dec 23, 1960 (61 y.o. F) Treating RN: Cornell Barman Primary Care Henna Derderian: Jonetta Osgood Other Clinician: Referring Martin Smeal: Jonetta Osgood Treating Sheneka Schrom/Extender: Skipper Cliche in Treatment: 5 Active Inactive Necrotic Tissue Nursing Diagnoses: Impaired tissue integrity related to necrotic/devitalized tissue Knowledge deficit related to management of necrotic/devitalized tissue Goals: Necrotic/devitalized tissue will be minimized in the wound bed Date Initiated: 09/10/2021 Target Resolution Date: 09/10/2021 Goal Status: Active Patient/caregiver will verbalize understanding of reason and process for debridement of necrotic tissue Date Initiated: 09/10/2021 Target  Resolution Date: 09/10/2021 Goal Status: Active Interventions: Assess patient pain level pre-, during and post procedure and prior to discharge Provide education on necrotic tissue and debridement process Treatment Activities: Excisional debridement : 09/10/2021 Notes: Orientation to the Wound Care Program Nursing Diagnoses: Knowledge deficit related to the wound healing center program Goals: Patient/caregiver will verbalize understanding of the [place] Date Initiated: 09/10/2021 Target Resolution Date: 09/10/2021 Goal Status: Active Interventions: Provide education on orientation to the wound center Notes: Wound/Skin Impairment Nursing Diagnoses: Impaired tissue integrity Goals: Patient/caregiver will verbalize understanding of skin care regimen Date Initiated: 09/10/2021 Target Resolution Date: 09/10/2021 Goal Status: Active Ulcer/skin breakdown will have a volume reduction of 30% by week 4 Date Initiated: 09/10/2021 Target Resolution Date: 10/08/2021 Goal Status: Active Interventions: Assess patient/caregiver ability to obtain necessary supplies Assess patient/caregiver ability to perform ulcer/skin care regimen upon admission and as needed ELIZABETHANNE, LUSHER ([id number]) Assess ulceration(s) every visit Treatment Activities: Skin care regimen initiated : 09/10/2021 Topical wound management initiated : 09/10/2021 Notes: Electronic Signature(s) Signed: 10/15/2021 2:53:55 PM By: Gretta Cool, BSN, RN, CWS, Kim RN, BSN Entered By: Gretta Cool, BSN, RN, CWS, Kim on 10/15/2021 10:43:33 Morgan Daniels ([id number]) -------------------------------------------------------------------------------- Pain Assessment Details Patient Name: Morgan Daniels Date of Service: 10/15/2021 10:00 AM Medical Record Number: [id number] Patient Account Number: [id number] Date of Birth/Sex: December 01, 1960 (61 y.o. F) Treating RN: Cornell Barman Primary Care Francee Setzer: Jonetta Osgood Other  Clinician: Referring Yetta Marceaux: Jonetta Osgood Treating Konnar Ben/Extender: Skipper Cliche in Treatment: 5 Active Problems Location of Pain Severity and Description of Pain Patient Has Paino No Site Locations Pain Management and Medication Current Pain Management: Notes states wound is irriating. Electronic Signature(s) Signed: 10/15/2021 2:53:55 PM By: Gretta Cool, BSN, RN, CWS, Kim RN, BSN Entered By: Gretta Cool, BSN, RN, CWS, Kim on 10/15/2021 10:31:38 Morgan Daniels ([id number]) -------------------------------------------------------------------------------- Patient/Caregiver Education Details Patient Name: Morgan Daniels Date of Service: 10/15/2021 10:00 AM Medical Record Number: [id number] Patient Account Number: [id number] Date of Birth/Gender: August 21, 1960 (61 y.o. F) Treating RN: Cornell Barman Primary Care Physician: Jonetta Osgood Other Clinician: Referring Physician: Jonetta Osgood Treating Physician/Extender: Skipper Cliche in Treatment: 5 Education Assessment Education Provided To: Patient Education Topics Provided Offloading: Handouts: What is Offloadingo, Other: keep pressure off of both heels Methods: Explain/Verbal Responses: State content correctly Electronic Signature(s) Signed: 10/15/2021 2:53:55 PM By: Gretta Cool, BSN, RN, CWS, Kim RN, BSN Entered By: Gretta Cool, BSN, RN, CWS, Kim on 10/15/2021 10:54:38 Morgan Daniels ([id number]) -------------------------------------------------------------------------------- Wound Assessment Details Patient Name: Morgan Daniels Date of Service: 10/15/2021 10:00 AM Medical Record Number: [id number]  Patient Account Number: [id number] Date of Birth/Sex: 1961-01-15 (61 y.o. F) Treating RN: Cornell Barman Primary Care Maecy Podgurski: Jonetta Osgood Other Clinician: Referring Tyeshia Cornforth: Jonetta Osgood Treating Kensie Susman/Extender: Skipper Cliche in Treatment: 5 Wound Status Wound Number: 1 Primary Diabetic Wound/Ulcer of the Lower  Extremity Etiology: Wound Location: Left Calcaneus Wound Open Wounding Event: Gradually Appeared Status: Date Acquired: 04/17/2021 Comorbid Coronary Artery Disease, Hypertension, Myocardial Weeks Of Treatment: 5 History: Infarction, Type II Diabetes, Neuropathy Clustered Wound: No Photos Wound Measurements Length: (cm) 0.7 Width: (cm) 0.6 Depth: (cm) 0.2 Area: (cm) 0.33 Volume: (cm) 0.066 % Reduction in Area: 76.7% % Reduction in Volume: 53.2% Epithelialization: None Tunneling: No Undermining: No Wound Description Classification: Grade 1 Wound Margin: Distinct, outline attached Exudate Amount: Medium Exudate Type: Serosanguineous Exudate Color: red, brown Foul Odor After Cleansing: No Slough/Fibrino Yes Wound Bed Granulation Amount: Small (1-33%) Exposed Structure Granulation Quality: Pink Fascia Exposed: No Necrotic Amount: Large (67-100%) Fat Layer (Subcutaneous Tissue) Exposed: Yes Necrotic Quality: Adherent Slough Tendon Exposed: No Muscle Exposed: No Joint Exposed: No Bone Exposed: No Treatment Notes Wound #1 (Calcaneus) Wound Laterality: Left Cleanser [place] Ancillary Kit - 15 Day Supply Discharge Instruction: Use supplies as instructed; Kit contains: (15) Saline Bullets; (15) 3x3 Gauze; 15 pr Gloves Peri-Wound Care CATHALINA, BARCIA ([id number]) Topical Santyl Collagenase Ointment, 30 (gm), tube Discharge Instruction: apply nickel thick to wound bed only Primary Dressing Gauze Discharge Instruction: As directed: dry, moistened with saline or moistened with Dakins Solution Secondary Dressing ABD Pad 5x9 (in/in) Discharge Instruction: Cover with ABD pad Kerlix 4.5 x 4.1 (in/yd) Discharge Instruction: Apply Kerlix 4.5 x 4.1 (in/yd) as instructed Secured With Medipore Tape - 101M Medipore H Soft Cloth Surgical Tape, 2x2 (in/yd) Compression Wrap Compression Stockings Add-Ons Electronic Signature(s) Signed: 10/15/2021 2:53:55 PM By: Gretta Cool, BSN, RN, CWS,  Kim RN, BSN Entered By: Gretta Cool, BSN, RN, CWS, Kim on 10/15/2021 10:36:01 Morgan Daniels ([id number]) -------------------------------------------------------------------------------- Vitals Details Patient Name: Morgan Daniels Date of Service: 10/15/2021 10:00 AM Medical Record Number: [id number] Patient Account Number: [id number] Date of Birth/Sex: May 05, 1960 (61 y.o. F) Treating RN: Cornell Barman Primary Care Gaylin Bulthuis: Jonetta Osgood Other Clinician: Referring Halana Deisher: Jonetta Osgood Treating Radiance Deady/Extender: Skipper Cliche in Treatment: 5 Vital Signs Time Taken: 10:29 Temperature (F): 99.1 Height (in): 67 Pulse (bpm): 71 Weight (lbs): 200 Respiratory Rate (breaths/min): 16 Body Mass Index (BMI): 31.3 Blood Pressure (mmHg): 181/96 Reference Range: 80 - 120 mg / dl Notes Patient states her BP has been high lately and she has an appointment with her cardiologist Thursday Aug 31. Electronic Signature(s) Signed: 10/15/2021 2:53:55 PM By: Gretta Cool, BSN, RN, CWS, Kim RN, BSN Entered By: Gretta Cool, BSN, RN, CWS, Kim on 10/15/2021 10:31:21

## 2021-10-15 NOTE — Progress Notes (Addendum)
Morgan Daniels, Morgan Daniels ([id number]) Visit Report for 10/15/2021 Chief Complaint Document Details Patient Name: Morgan Daniels, Morgan Daniels Date of Service: 10/15/2021 10:00 AM Medical Record Number: [id number] Patient Account Number: [id number] Date of Birth/Sex: [birth year] (61 y.o. F) Treating RN: Cornell Barman Primary Care Provider: Jonetta Osgood Other Clinician: Referring Provider: Jonetta Osgood Treating Provider/Extender: Skipper Cliche in Treatment: 5 Information Obtained from: Patient Chief Complaint 09/11/2022; patient comes in for review of an area on the tip of her left heel Electronic Signature(s) Signed: 10/15/2021 10:42:28 AM By: Worthy Keeler PA-C Entered By: Worthy Keeler on 10/15/2021 10:42:28 Morgan Daniels, Morgan Daniels ([id number]) -------------------------------------------------------------------------------- HPI Details Patient Name: Morgan Daniels Date of Service: 10/15/2021 10:00 AM Medical Record Number: [id number] Patient Account Number: [id number] Date of Birth/Sex: [birth year] (61 y.o. F) Treating RN: Cornell Barman Primary Care Provider: Jonetta Osgood Other Clinician: Referring Provider: Jonetta Osgood Treating Provider/Extender: Skipper Cliche in Treatment: 5 History of Present Illness HPI Description: ADMISSION 09/10/2021 This is a 61 year old woman who arrived in clinic accompanied by her husband. She is here for review of a area on the tip of her left heel which has been present by her description for about 3 months. I did not see any description of this in care everywhere. She is a type II diabetic with peripheral arterial disease and peripheral neuropathy. In fact she has had 2 procedures by Dr. Lucky Cowboy of vein and vascular 1 on 01/03/2021 at which time she had a angioplasty of the left anterior tibial artery and a mechanical thrombectomy of the left SFA and proximal popliteal. She underwent that she then underwent an angioplasty of the left SFA and pop and proximal  popliteal and a stent in the left SFA. On 02/28/2021 she again went underwent a mechanical thrombectomy of the left SFA and popliteal arteries and a stent placement in the left SFA and proximal popliteal as well as reangioplasty of the left anterior tibial. The patient states that the area on the left heel started about 3 months ago it is scabbed over and she cannot get this to heal. She is using a foot cream that was given to her by podiatry [foot miracle]. She also has an area on the right heel medially almost mirror image to the left although there is no open wound here the skin in the area is dry and scaly. There is no evidence of infection Past medical history includes type 2 diabetes with PAD and peripheral neuropathy, coronary artery disease status post CABG x4, depression, hypertension, hypercholesterolemia, irritable bowel, carotid artery stenosis 09-24-2021 upon evaluation today patient appears to be doing well currently in regard to her wound as far as infection is concerned there is no signs of active infection at this time which is great news. With that being said she is still having significant pain here. Unfortunately I think this is more neuropathic in nature and is not to be a whole lot to do for this except for to get the wound healed so that there is no source of irritation and inflammation. With that being said I do believe that we need to perform debridement to do this will probably need to numb her. 10-01-2021 upon evaluation today patient appears to be doing well currently in regard to her heel ulcer. This is actually significantly improved compared to last week's visit. I am very pleased with where things stand and I do not think there is any evidence of active infection at this time which is great news. No fevers,  chills, nausea, vomiting, or diarrhea. 10-15-2021 upon evaluation today patient appears to be doing well currently in regard to her heel ulcer. She has been tolerating  the dressing changes without complication. Fortunately there does not appear to be any evidence of active infection at this time which is great news. No fevers, chills, nausea, vomiting, or diarrhea. Electronic Signature(s) Signed: 10/15/2021 10:51:28 AM By: Worthy Keeler PA-C Entered By: Worthy Keeler on 10/15/2021 10:51:28 Morgan Daniels, Morgan Daniels ([id number]) -------------------------------------------------------------------------------- Physical Exam Details Patient Name: Morgan Daniels Date of Service: 10/15/2021 10:00 AM Medical Record Number: [id number] Patient Account Number: [id number] Date of Birth/Sex: [birth year] (61 y.o. F) Treating RN: Cornell Barman Primary Care Provider: Jonetta Osgood Other Clinician: Referring Provider: Jonetta Osgood Treating Provider/Extender: Skipper Cliche in Treatment: 5 Constitutional Well-nourished and well-hydrated in no acute distress. Respiratory normal breathing without difficulty. Psychiatric this patient is able to make decisions and demonstrates good insight into disease process. Alert and Oriented x 3. pleasant and cooperative. Notes Upon inspection patient's wound bed actually showed signs of good granulation and epithelization there was minimal slough buildup at this point which is great news and overall she seems to be doing quite well. Electronic Signature(s) Signed: 10/15/2021 10:51:41 AM By: Worthy Keeler PA-C Entered By: Worthy Keeler on 10/15/2021 10:51:41 Morgan Daniels, Morgan Daniels ([id number]) -------------------------------------------------------------------------------- Physician Orders Details Patient Name: Morgan Daniels Date of Service: 10/15/2021 10:00 AM Medical Record Number: [id number] Patient Account Number: [id number] Date of Birth/Sex: 1960/09/11 (61 y.o. F) Treating RN: Cornell Barman Primary Care Provider: Jonetta Osgood Other Clinician: Referring Provider: Jonetta Osgood Treating Provider/Extender: Skipper Cliche in Treatment: 5 Verbal / Phone Orders: No Diagnosis Coding ICD-10 Coding Code Description E11.621 Type 2 diabetes mellitus with foot ulcer L97.528 Non-pressure chronic ulcer of other part of left foot with other specified severity E11.51 Type 2 diabetes mellitus with diabetic peripheral angiopathy without gangrene E11.42 Type 2 diabetes mellitus with diabetic polyneuropathy Follow-up Appointments o Return Appointment in 2 weeks. o Nurse Visit as needed Bathing/ Shower/ Hygiene o May shower; gently cleanse wound with antibacterial soap, rinse and pat dry prior to dressing wounds Anesthetic (Use 'Patient Medications' Section for Anesthetic Order Entry) o Lidocaine applied to wound bed Off-Loading Wound #1 Left Calcaneus o Open toe surgical shoe Wound Treatment Wound #1 - Calcaneus Wound Laterality: Left Topical: Santyl Collagenase Ointment, 30 (gm), tube 1 x Per Day/30 Days Discharge Instructions: apply nickel thick to wound bed only Primary Dressing: Gauze (Generic) 1 x Per Day/30 Days Discharge Instructions: As directed: dry, moistened with saline or moistened with Dakins Solution Secondary Dressing: ABD Pad 5x9 (in/in) (Generic) 1 x Per Day/30 Days Discharge Instructions: Cover with ABD pad Secondary Dressing: Kerlix 4.5 x 4.1 (in/yd) (Generic) 1 x Per Day/30 Days Discharge Instructions: Apply Kerlix 4.5 x 4.1 (in/yd) as instructed Secured With: Medipore Tape - 19M Medipore H Soft Cloth Surgical Tape, 2x2 (in/yd) (Generic) 1 x Per Day/30 Days Electronic Signature(s) Signed: 10/15/2021 2:53:55 PM By: Gretta Cool, BSN, RN, CWS, Kim RN, BSN Signed: 10/15/2021 4:37:11 PM By: Worthy Keeler PA-C Entered By: Gretta Cool, BSN, RN, CWS, Kim on 10/15/2021 10:53:54 Morgan Daniels ([id number]) -------------------------------------------------------------------------------- Problem List Details Patient Name: Morgan Daniels Date of Service: 10/15/2021 10:00 AM Medical Record  Number: [id number] Patient Account Number: [id number] Date of Birth/Sex: 05/21/1960 (61 y.o. F) Treating RN: Cornell Barman Primary Care Provider: Jonetta Osgood Other Clinician: Referring Provider: Jonetta Osgood Treating Provider/Extender: Skipper Cliche in Treatment: 5 Active Problems ICD-10 Encounter  Code Description Active Date MDM Diagnosis E11.621 Type 2 diabetes mellitus with foot ulcer 09/10/2021 No Yes L97.528 Non-pressure chronic ulcer of other part of left foot with other specified 09/10/2021 No Yes severity E11.51 Type 2 diabetes mellitus with diabetic peripheral angiopathy without 09/10/2021 No Yes gangrene E11.42 Type 2 diabetes mellitus with diabetic polyneuropathy 09/10/2021 No Yes Inactive Problems Resolved Problems Electronic Signature(s) Signed: 10/15/2021 10:42:25 AM By: Worthy Keeler PA-C Entered By: Worthy Keeler on 10/15/2021 10:42:25 Morgan Daniels ([id number]) -------------------------------------------------------------------------------- Progress Note Details Patient Name: Morgan Daniels Date of Service: 10/15/2021 10:00 AM Medical Record Number: [id number] Patient Account Number: [id number] Date of Birth/Sex: 08/20/1960 (61 y.o. F) Treating RN: Cornell Barman Primary Care Provider: Jonetta Osgood Other Clinician: Referring Provider: Jonetta Osgood Treating Provider/Extender: Skipper Cliche in Treatment: 5 Subjective Chief Complaint Information obtained from Patient 09/11/2022; patient comes in for review of an area on the tip of her left heel History of Present Illness (HPI) ADMISSION 09/10/2021 This is a 61 year old woman who arrived in clinic accompanied by her husband. She is here for review of a area on the tip of her left heel which has been present by her description for about 3 months. I did not see any description of this in care everywhere. She is a type II diabetic with peripheral arterial disease and peripheral neuropathy. In  fact she has had 2 procedures by Dr. Lucky Cowboy of vein and vascular 1 on 01/03/2021 at which time she had a angioplasty of the left anterior tibial artery and a mechanical thrombectomy of the left SFA and proximal popliteal. She underwent that she then underwent an angioplasty of the left SFA and pop and proximal popliteal and a stent in the left SFA. On 02/28/2021 she again went underwent a mechanical thrombectomy of the left SFA and popliteal arteries and a stent placement in the left SFA and proximal popliteal as well as reangioplasty of the left anterior tibial. The patient states that the area on the left heel started about 3 months ago it is scabbed over and she cannot get this to heal. She is using a foot cream that was given to her by podiatry [foot miracle]. She also has an area on the right heel medially almost mirror image to the left although there is no open wound here the skin in the area is dry and scaly. There is no evidence of infection Past medical history includes type 2 diabetes with PAD and peripheral neuropathy, coronary artery disease status post CABG x4, depression, hypertension, hypercholesterolemia, irritable bowel, carotid artery stenosis 09-24-2021 upon evaluation today patient appears to be doing well currently in regard to her wound as far as infection is concerned there is no signs of active infection at this time which is great news. With that being said she is still having significant pain here. Unfortunately I think this is more neuropathic in nature and is not to be a whole lot to do for this except for to get the wound healed so that there is no source of irritation and inflammation. With that being said I do believe that we need to perform debridement to do this will probably need to numb her. 10-01-2021 upon evaluation today patient appears to be doing well currently in regard to her heel ulcer. This is actually significantly improved compared to last week's visit. I am  very pleased with where things stand and I do not think there is any evidence of active infection at this time which is  great news. No fevers, chills, nausea, vomiting, or diarrhea. 10-15-2021 upon evaluation today patient appears to be doing well currently in regard to her heel ulcer. She has been tolerating the dressing changes without complication. Fortunately there does not appear to be any evidence of active infection at this time which is great news. No fevers, chills, nausea, vomiting, or diarrhea. Objective Constitutional Well-nourished and well-hydrated in no acute distress. Vitals Time Taken: 10:29 AM, Height: 67 in, Weight: 200 lbs, BMI: 31.3, Temperature: 99.1 F, Pulse: 71 bpm, Respiratory Rate: 16 breaths/min, Blood Pressure: 181/96 mmHg. General Notes: Patient states her BP has been high lately and she has an appointment with her cardiologist Thursday Aug 31. Respiratory normal breathing without difficulty. Psychiatric this patient is able to make decisions and demonstrates good insight into disease process. Alert and Oriented x 3. pleasant and cooperative. General Notes: Upon inspection patient's wound bed actually showed signs of good granulation and epithelization there was minimal slough buildup at this point which is great news and overall she seems to be doing quite well. Morgan Daniels, Morgan Daniels ([id number]) Integumentary (Hair, Skin) Wound #1 status is Open. Original cause of wound was Gradually Appeared. The date acquired was: 04/17/2021. The wound has been in treatment 5 weeks. The wound is located on the Left Calcaneus. The wound measures 0.7cm length x 0.6cm width x 0.2cm depth; 0.33cm^2 area and 0.066cm^3 volume. There is Fat Layer (Subcutaneous Tissue) exposed. There is no tunneling or undermining noted. There is a medium amount of serosanguineous drainage noted. The wound margin is distinct with the outline attached to the wound base. There is small (1-33%) pink granulation  within the wound bed. There is a large (67-100%) amount of necrotic tissue within the wound bed including Adherent Slough. Assessment Active Problems ICD-10 Type 2 diabetes mellitus with foot ulcer Non-pressure chronic ulcer of other part of left foot with other specified severity Type 2 diabetes mellitus with diabetic peripheral angiopathy without gangrene Type 2 diabetes mellitus with diabetic polyneuropathy Plan 1. I would recommend currently that we going continue with the wound care measures as before and the patient is in agreement with plan. This includes the use of the Santyl which I believe is doing quite well. 2. I am also can recommend that we have the patient continue to monitor for any signs of infection obviously if anything changes patient should contact the office and let me know. Otherwise my hope is that she will continue to show signs of improvement and by next time if not completely healed we will be able to switch to a different dressing to try to speed up this closure. We will see patient back for reevaluation in 1 week here in the clinic. If anything worsens or changes patient will contact our office for additional recommendations. Electronic Signature(s) Signed: 10/15/2021 10:53:26 AM By: Worthy Keeler PA-C Entered By: Worthy Keeler on 10/15/2021 10:53:26 Morgan Daniels, Morgan Daniels ([id number]) -------------------------------------------------------------------------------- SuperBill Details Patient Name: Morgan Daniels Date of Service: 10/15/2021 Medical Record Number: [id number] Patient Account Number: [id number] Date of Birth/Sex: February 15, 1961 (61 y.o. F) Treating RN: Cornell Barman Primary Care Provider: Jonetta Osgood Other Clinician: Referring Provider: Jonetta Osgood Treating Provider/Extender: Skipper Cliche in Treatment: 5 Diagnosis Coding ICD-10 Codes Code Description E11.621 Type 2 diabetes mellitus with foot ulcer L97.528 Non-pressure chronic ulcer of  other part of left foot with other specified severity E11.51 Type 2 diabetes mellitus with diabetic peripheral angiopathy without gangrene E11.42 Type 2 diabetes mellitus with diabetic polyneuropathy Facility  Procedures CPT4 Code: [id number] Description: 75436 - DEBRIDE W/O ANES NON SELECT Modifier: Quantity: 1 Physician Procedures CPT4 Code: 0677034 Description: 03524 - WC PHYS LEVEL 3 - EST PT Modifier: Quantity: 1 CPT4 Code: Description: ICD-10 Diagnosis Description E11.621 Type 2 diabetes mellitus with foot ulcer L97.528 Non-pressure chronic ulcer of other part of left foot with other specifie E11.51 Type 2 diabetes mellitus with diabetic peripheral angiopathy without gang  E11.42 Type 2 diabetes mellitus with diabetic polyneuropathy Modifier: d severity rene Quantity: Electronic Signature(s) Signed: 10/15/2021 2:53:55 PM By: Gretta Cool, BSN, RN, CWS, Kim RN, BSN Signed: 10/15/2021 4:37:11 PM By: Worthy Keeler PA-C Previous Signature: 10/15/2021 10:53:37 AM Version By: Worthy Keeler PA-C Entered By: Gretta Cool, BSN, RN, CWS, Kim on 10/15/2021 10:54:14

## 2021-10-17 DIAGNOSIS — E7800 Pure hypercholesterolemia, unspecified: Secondary | ICD-10-CM | POA: Diagnosis not present

## 2021-10-17 DIAGNOSIS — I25721 Atherosclerosis of autologous artery coronary artery bypass graft(s) with angina pectoris with documented spasm: Secondary | ICD-10-CM | POA: Diagnosis not present

## 2021-10-17 DIAGNOSIS — I739 Peripheral vascular disease, unspecified: Secondary | ICD-10-CM | POA: Diagnosis not present

## 2021-10-17 DIAGNOSIS — I10 Essential (primary) hypertension: Secondary | ICD-10-CM | POA: Diagnosis not present

## 2021-10-17 DIAGNOSIS — I63433 Cerebral infarction due to embolism of bilateral posterior cerebral arteries: Secondary | ICD-10-CM | POA: Diagnosis not present

## 2021-10-29 ENCOUNTER — Encounter: Payer: Medicare Other | Attending: Physician Assistant | Admitting: Physician Assistant

## 2021-10-29 DIAGNOSIS — E7800 Pure hypercholesterolemia, unspecified: Secondary | ICD-10-CM | POA: Insufficient documentation

## 2021-10-29 DIAGNOSIS — E11621 Type 2 diabetes mellitus with foot ulcer: Secondary | ICD-10-CM | POA: Insufficient documentation

## 2021-10-29 DIAGNOSIS — E1142 Type 2 diabetes mellitus with diabetic polyneuropathy: Secondary | ICD-10-CM | POA: Diagnosis not present

## 2021-10-29 DIAGNOSIS — I2510 Atherosclerotic heart disease of native coronary artery without angina pectoris: Secondary | ICD-10-CM | POA: Insufficient documentation

## 2021-10-29 DIAGNOSIS — L97412 Non-pressure chronic ulcer of right heel and midfoot with fat layer exposed: Secondary | ICD-10-CM | POA: Insufficient documentation

## 2021-10-29 DIAGNOSIS — L97528 Non-pressure chronic ulcer of other part of left foot with other specified severity: Secondary | ICD-10-CM | POA: Diagnosis not present

## 2021-10-29 DIAGNOSIS — I10 Essential (primary) hypertension: Secondary | ICD-10-CM | POA: Diagnosis not present

## 2021-10-29 DIAGNOSIS — L97422 Non-pressure chronic ulcer of left heel and midfoot with fat layer exposed: Secondary | ICD-10-CM | POA: Diagnosis not present

## 2021-10-29 DIAGNOSIS — E1151 Type 2 diabetes mellitus with diabetic peripheral angiopathy without gangrene: Secondary | ICD-10-CM | POA: Insufficient documentation

## 2021-10-29 NOTE — Progress Notes (Addendum)
ARIONNA, HOGGARD ([id number]) Visit Report for 10/29/2021 Arrival Information Details Patient Name: Morgan Daniels, Morgan Daniels Date of Service: 10/29/2021 11:30 AM Medical Record Number: [id number] Patient Account Number: [id number] Date of Birth/Sex: 09-May-1960 (61 y.o. F) Treating RN: Cornell Barman Primary Care Sun Wilensky: Jonetta Osgood Other Clinician: Referring Aalia Greulich: Jonetta Osgood Treating Meg Niemeier/Extender: Skipper Cliche in Treatment: 7 Visit Information History Since Last Visit Added or deleted any medications: No Patient Arrived: Ambulatory Pain Present Now: No Arrival Time: 11:26 Accompanied By: husband Transfer Assistance: None Patient Requires Transmission-Based No Precautions: Patient Has Alerts: Yes Patient Alerts: Patient on Blood Thinner Type II Diabetic Aspirin '81mg'$  [facility] 04/01/20 ABI R 1.02 L 1.04 TBI R 0.89 L 0.87 Electronic Signature(s) Signed: 10/29/2021 5:46:57 PM By: Gretta Cool, BSN, RN, CWS, Kim RN, BSN Entered By: Gretta Cool, BSN, RN, CWS, Kim on 10/29/2021 11:31:36 Morgan Daniels ([id number]) -------------------------------------------------------------------------------- Encounter Discharge Information Details Patient Name: Morgan Daniels Date of Service: 10/29/2021 11:30 AM Medical Record Number: [id number] Patient Account Number: [id number] Date of Birth/Sex: 06-16-1960 (61 y.o. F) Treating RN: Cornell Barman Primary Care Alethea Terhaar: Jonetta Osgood Other Clinician: Referring Ashleah Valtierra: Jonetta Osgood Treating Anylah Scheib/Extender: Skipper Cliche in Treatment: 7 Encounter Discharge Information Items Post Procedure Vitals Discharge Condition: Stable Temperature (F): 98.4 Ambulatory Status: Ambulatory Pulse (bpm): 67 Discharge Destination: Home Respiratory Rate (breaths/min): 18 Transportation: Private Auto Blood Pressure (mmHg): 162/82 Accompanied By: husband Schedule Follow-up Appointment: Yes Clinical Summary of Care: Electronic Signature(s) Signed:  10/29/2021 5:46:57 PM By: Gretta Cool, BSN, RN, CWS, Kim RN, BSN Entered By: Gretta Cool, BSN, RN, CWS, Kim on 10/29/2021 12:06:59 Morgan Daniels ([id number]) -------------------------------------------------------------------------------- Lower Extremity Assessment Details Patient Name: Morgan Daniels Date of Service: 10/29/2021 11:30 AM Medical Record Number: [id number] Patient Account Number: [id number] Date of Birth/Sex: 11/07/1960 (61 y.o. F) Treating RN: Cornell Barman Primary Care Irfan Veal: Jonetta Osgood Other Clinician: Referring Sanel Stemmer: Jonetta Osgood Treating Necia Kamm/Extender: Skipper Cliche in Treatment: 7 Edema Assessment Assessed: [Left: Yes] [Right: Yes] Edema: [Left: No] [Right: No] Vascular Assessment Pulses: Dorsalis Pedis Palpable: [Left:Yes] [Right:Yes] Electronic Signature(s) Signed: 10/29/2021 5:46:57 PM By: Gretta Cool, BSN, RN, CWS, Kim RN, BSN Entered By: Gretta Cool, BSN, RN, CWS, Kim on 10/29/2021 11:52:34 AURIE, HARROUN ([id number]) -------------------------------------------------------------------------------- Multi Wound Chart Details Patient Name: Morgan Daniels Date of Service: 10/29/2021 11:30 AM Medical Record Number: [id number] Patient Account Number: [id number] Date of Birth/Sex: Jul 30, 1960 (61 y.o. F) Treating RN: Cornell Barman Primary Care Loye Vento: Jonetta Osgood Other Clinician: Referring Tobey Lippard: Jonetta Osgood Treating Kaydense Rizo/Extender: Skipper Cliche in Treatment: 7 Vital Signs Height(in): 52 Pulse(bpm): 84 Weight(lbs): 200 Blood Pressure(mmHg): 162/85 Body Mass Index(BMI): 31.3 Temperature(F): 98.4 Respiratory Rate(breaths/min): 18 Photos: [N/A:N/A] Wound Location: Left Calcaneus Right, Plantar Calcaneus N/A Wounding Event: Gradually Appeared Gradually Appeared N/A Primary Etiology: Diabetic Wound/Ulcer of the Lower Diabetic Wound/Ulcer of the Lower N/A Extremity Extremity Comorbid History: Coronary Artery Disease, Coronary Artery  Disease, N/A Hypertension, Myocardial Infarction, Hypertension, Myocardial Infarction, Type II Diabetes, Neuropathy Type II Diabetes, Neuropathy Date Acquired: 04/17/2021 10/22/2021 N/A Weeks of Treatment: 7 0 N/A Wound Status: Open Open N/A Wound Recurrence: No No N/A Measurements L x W x D (cm) 0.2x0.2x0.1 0.2x0.3x0.2 N/A Area (cm) : 0.031 0.047 N/A Volume (cm) : 0.003 0.009 N/A % Reduction in Area: 97.80% 0.00% N/A % Reduction in Volume: 97.90% 0.00% N/A Classification: Grade 1 Grade 1 N/A Exudate Amount: Medium Medium N/A Exudate Type: Serosanguineous Serous N/A Exudate Color: red, brown amber N/A Wound Margin: Distinct, outline attached Flat and Intact N/A Granulation Amount: Small (1-33%) None  Present (0%) N/A Granulation Quality: Pink N/A N/A Necrotic Amount: Large (67-100%) Large (67-100%) N/A Exposed Structures: Fat Layer (Subcutaneous Tissue): Fat Layer (Subcutaneous Tissue): N/A Yes Yes Fascia: No Tendon: No Muscle: No Joint: No Bone: No Epithelialization: None N/A N/A Treatment Notes Electronic Signature(s) Signed: 10/29/2021 5:46:57 PM By: Gretta Cool, BSN, RN, CWS, Kim RN, BSN Entered By: Gretta Cool, BSN, RN, CWS, Kim on 10/29/2021 11:57:50 Morgan, Daniels ([id number]) IVIONNA, Daniels ([id number]) -------------------------------------------------------------------------------- [place] Details Patient Name: Morgan Daniels Date of Service: 10/29/2021 11:30 AM Medical Record Number: [id number] Patient Account Number: [id number] Date of Birth/Sex: [birth year] (61 y.o. F) Treating RN: Cornell Barman Primary Care Shreyas Piatkowski: Jonetta Osgood Other Clinician: Referring Sven Pinheiro: Jonetta Osgood Treating Everrett Lacasse/Extender: Skipper Cliche in Treatment: 7 Active Inactive Necrotic Tissue Nursing Diagnoses: Impaired tissue integrity related to necrotic/devitalized tissue Knowledge deficit related to management of necrotic/devitalized  tissue Goals: Necrotic/devitalized tissue will be minimized in the wound bed Date Initiated: 09/10/2021 Target Resolution Date: 09/10/2021 Goal Status: Active Patient/caregiver will verbalize understanding of reason and process for debridement of necrotic tissue Date Initiated: 09/10/2021 Target Resolution Date: 09/10/2021 Goal Status: Active Interventions: Assess patient pain level pre-, during and post procedure and prior to discharge Provide education on necrotic tissue and debridement process Treatment Activities: Excisional debridement : 09/10/2021 Notes: Orientation to the Wound Care Program Nursing Diagnoses: Knowledge deficit related to the wound healing center program Goals: Patient/caregiver will verbalize understanding of the [place] Date Initiated: 09/10/2021 Target Resolution Date: 09/10/2021 Goal Status: Active Interventions: Provide education on orientation to the wound center Notes: Wound/Skin Impairment Nursing Diagnoses: Impaired tissue integrity Goals: Patient/caregiver will verbalize understanding of skin care regimen Date Initiated: 09/10/2021 Target Resolution Date: 09/10/2021 Goal Status: Active Ulcer/skin breakdown will have a volume reduction of 30% by week 4 Date Initiated: 09/10/2021 Target Resolution Date: 10/08/2021 Goal Status: Active Interventions: Assess patient/caregiver ability to obtain necessary supplies Assess patient/caregiver ability to perform ulcer/skin care regimen upon admission and as needed LAVAUN, GREENFIELD ([id number]) Assess ulceration(s) every visit Treatment Activities: Skin care regimen initiated : 09/10/2021 Topical wound management initiated : 09/10/2021 Notes: Electronic Signature(s) Signed: 10/29/2021 5:46:57 PM By: Gretta Cool, BSN, RN, CWS, Kim RN, BSN Entered By: Gretta Cool, BSN, RN, CWS, Kim on 10/29/2021 11:57:45 Morgan Daniels  ([id number]) -------------------------------------------------------------------------------- Pain Assessment Details Patient Name: Morgan Daniels Date of Service: 10/29/2021 11:30 AM Medical Record Number: [id number] Patient Account Number: [id number] Date of Birth/Sex: 1960-06-23 (61 y.o. F) Treating RN: Cornell Barman Primary Care Marlisha Vanwyk: Jonetta Osgood Other Clinician: Referring Miral Hoopes: Jonetta Osgood Treating Kaedyn Polivka/Extender: Skipper Cliche in Treatment: 7 Active Problems Location of Pain Severity and Description of Pain Patient Has Paino No Site Locations Pain Management and Medication Current Pain Management: Notes Patient states only hurts when walking. Electronic Signature(s) Signed: 10/29/2021 5:46:57 PM By: Gretta Cool, BSN, RN, CWS, Kim RN, BSN Entered By: Gretta Cool, BSN, RN, CWS, Kim on 10/29/2021 11:32:18 AER[place], RINCON ([id number]) -------------------------------------------------------------------------------- Patient/Caregiver Education Details Patient Name: Morgan Daniels Date of Service: 10/29/2021 11:30 AM Medical Record Number: [id number] Patient Account Number: [id number] Date of Birth/Gender: February 18, 1960 (61 y.o. F) Treating RN: Cornell Barman Primary Care Physician: Jonetta Osgood Other Clinician: Referring Physician: Jonetta Osgood Treating Physician/Extender: Skipper Cliche in Treatment: 7 Education Assessment Education Provided To: Patient Education Topics Provided Wound Debridement: Handouts: Wound Debridement Methods: Demonstration, Explain/Verbal Responses: State content correctly Wound/Skin Impairment: Handouts: Caring for Your Ulcer Methods: Demonstration, Explain/Verbal Responses: State content correctly Electronic Signature(s) Signed: 10/29/2021 5:46:57 PM By: Gretta Cool,  BSN, RN, CWS, Kim RN, BSN Entered By: Gretta Cool, BSN, RN, CWS, Kim on 10/29/2021 12:03:41 RUBYLEE, ZAMARRIPA  ([id number]) -------------------------------------------------------------------------------- Wound Assessment Details Patient Name: Morgan Daniels Date of Service: 10/29/2021 11:30 AM Medical Record Number: [id number] Patient Account Number: [id number] Date of Birth/Sex: 09-23-1960 (61 y.o. F) Treating RN: Cornell Barman Primary Care Izaiyah Kleinman: Jonetta Osgood Other Clinician: Referring Maxamillian Tienda: Jonetta Osgood Treating Demetrious Rainford/Extender: Skipper Cliche in Treatment: 7 Wound Status Wound Number: 1 Primary Diabetic Wound/Ulcer of the Lower Extremity Etiology: Wound Location: Left Calcaneus Wound Open Wounding Event: Gradually Appeared Status: Date Acquired: 04/17/2021 Comorbid Coronary Artery Disease, Hypertension, Myocardial Weeks Of Treatment: 7 History: Infarction, Type II Diabetes, Neuropathy Clustered Wound: No Photos Wound Measurements Length: (cm) 0.2 % Re Width: (cm) 0.2 % Re Depth: (cm) 0.1 Epit Area: (cm) 0.031 Volume: (cm) 0.003 duction in Area: 97.8% duction in Volume: 97.9% helialization: None Wound Description Classification: Grade 1 Foul Wound Margin: Distinct, outline attached Slou Exudate Amount: Medium Exudate Type: Serosanguineous Exudate Color: red, brown Odor After Cleansing: No gh/Fibrino Yes Wound Bed Granulation Amount: Small (1-33%) Exposed Structure Granulation Quality: Pink Fascia Exposed: No Necrotic Amount: Large (67-100%) Fat Layer (Subcutaneous Tissue) Exposed: Yes Necrotic Quality: Adherent Slough Tendon Exposed: No Muscle Exposed: No Joint Exposed: No Bone Exposed: No Treatment Notes Wound #1 (Calcaneus) Wound Laterality: Left Cleanser Peri-Wound Care Topical STARLEE, CORRALEJO ([id number]) Primary Dressing Xeroform-HBD 2x2 (in/in) Discharge Instruction: Apply Xeroform-HBD 2x2 (in/in) as directed Secondary Dressing Secured With Compression Wrap Compression Stockings Add-Ons Electronic Signature(s) Signed: 10/29/2021  5:46:57 PM By: Gretta Cool, BSN, RN, CWS, Kim RN, BSN Entered By: Gretta Cool, BSN, RN, CWS, Kim on 10/29/2021 11:47:02 RHODIA, ACRES ([id number]) -------------------------------------------------------------------------------- Wound Assessment Details Patient Name: Morgan Daniels Date of Service: 10/29/2021 11:30 AM Medical Record Number: [id number] Patient Account Number: [id number] Date of Birth/Sex: 31-Mar-1960 (61 y.o. F) Treating RN: Cornell Barman Primary Care Gwendlyon Zumbro: Jonetta Osgood Other Clinician: Referring Larie Mathes: Jonetta Osgood Treating Jenavie Stanczak/Extender: Skipper Cliche in Treatment: 7 Wound Status Wound Number: 2 Primary Diabetic Wound/Ulcer of the Lower Extremity Etiology: Wound Location: Right, Plantar Calcaneus Wound Open Wounding Event: Gradually Appeared Status: Date Acquired: 10/22/2021 Comorbid Coronary Artery Disease, Hypertension, Myocardial Weeks Of Treatment: 0 History: Infarction, Type II Diabetes, Neuropathy Clustered Wound: No Photos Wound Measurements Length: (cm) 0.2 Width: (cm) 0.3 Depth: (cm) 0.2 Area: (cm) 0.047 Volume: (cm) 0.009 % Reduction in Area: 0% % Reduction in Volume: 0% Wound Description Classification: Grade 1 Wound Margin: Flat and Intact Exudate Amount: Medium Exudate Type: Serous Exudate Color: amber Foul Odor After Cleansing: No Slough/Fibrino Yes Wound Bed Granulation Amount: None Present (0%) Exposed Structure Necrotic Amount: Large (67-100%) Fat Layer (Subcutaneous Tissue) Exposed: Yes Necrotic Quality: Adherent Slough Treatment Notes Wound #2 (Calcaneus) Wound Laterality: Plantar, Right Cleanser Peri-Wound Care Topical Primary Dressing Xeroform-HBD 2x2 (in/in) Discharge Instruction: Apply Xeroform-HBD 2x2 (in/in) as directed MINA, BABULA ([id number]) Secondary Dressing Secured With Conform 2''- Conforming Stretch Gauze Bandage 2x75 (in/in) Discharge Instruction: Apply as directed Compression  Wrap Compression Stockings Add-Ons Electronic Signature(s) Signed: 10/29/2021 5:46:57 PM By: Gretta Cool, BSN, RN, CWS, Kim RN, BSN Entered By: Gretta Cool, BSN, RN, CWS, Kim on 10/29/2021 11:53:43 Morgan Daniels ([id number]) -------------------------------------------------------------------------------- Vitals Details Patient Name: Morgan Daniels Date of Service: 10/29/2021 11:30 AM Medical Record Number: [id number] Patient Account Number: [id number] Date of Birth/Sex: May 06, 1960 (61 y.o. F) Treating RN: Cornell Barman Primary Care Raiana Pharris: Jonetta Osgood Other Clinician: Referring Brelynn Wheller: Jonetta Osgood Treating Ruqayya Ventress/Extender: Skipper Cliche in Treatment: 7 Vital Signs  Time Taken: 11:31 Temperature (F): 98.4 Height (in): 67 Pulse (bpm): 67 Weight (lbs): 200 Respiratory Rate (breaths/min): 18 Body Mass Index (BMI): 31.3 Blood Pressure (mmHg): 162/85 Reference Range: 80 - 120 mg / dl Electronic Signature(s) Signed: 10/29/2021 5:46:57 PM By: Gretta Cool, BSN, RN, CWS, Kim RN, BSN Entered By: Gretta Cool, BSN, RN, CWS, Kim on 10/29/2021 11:31:57

## 2021-10-29 NOTE — Progress Notes (Addendum)
Morgan Daniels ([id number]) Visit Report for 10/29/2021 Chief Complaint Document Details Patient Name: Morgan Daniels, Morgan Daniels Date of Service: 10/29/2021 11:30 AM Medical Record Number: [id number] Patient Account Number: [id number] Date of Birth/Sex: 13-Sep-1960 (61 y.o. F) Treating RN: Cornell Barman Primary Care Provider: Jonetta Osgood Other Clinician: Referring Provider: Jonetta Osgood Treating Provider/Extender: Skipper Cliche in Treatment: 7 Information Obtained from: Patient Chief Complaint 09/11/2022; patient comes in for review of an area on the tip of her left heel Electronic Signature(s) Signed: 10/29/2021 11:28:05 AM By: Worthy Keeler PA-C Entered By: Worthy Keeler on 10/29/2021 11:28:05 Morgan Daniels ([id number]) -------------------------------------------------------------------------------- Debridement Details Patient Name: Morgan Daniels Date of Service: 10/29/2021 11:30 AM Medical Record Number: [id number] Patient Account Number: [id number] Date of Birth/Sex: 1960-12-07 (61 y.o. F) Treating RN: Cornell Barman Primary Care Provider: Jonetta Osgood Other Clinician: Referring Provider: Jonetta Osgood Treating Provider/Extender: Skipper Cliche in Treatment: 7 Debridement Performed for Wound #2 Right,Plantar Calcaneus Assessment: Performed By: Physician Tommie Sams., PA-C Debridement Type: Debridement Severity of Tissue Pre Debridement: Fat layer exposed Level of Consciousness (Pre- Awake and Alert procedure): Pre-procedure Verification/Time Out Yes - 11:55 Taken: Total Area Debrided (L x W): 0.3 (cm) x 0.3 (cm) = 0.09 (cm) Tissue and other material Viable, Subcutaneous, Fibrin/Exudate debrided: Level: Skin/Subcutaneous Tissue Debridement Description: Excisional Instrument: Curette Bleeding: Minimum Hemostasis Achieved: Pressure Response to Treatment: Procedure was tolerated well Level of Consciousness (Post- Awake and Alert procedure): Post  Debridement Measurements of Total Wound Length: (cm) 0.3 Width: (cm) 0.3 Depth: (cm) 0.2 Volume: (cm) 0.014 Character of Wound/Ulcer Post Debridement: Stable Severity of Tissue Post Debridement: Fat layer exposed Post Procedure Diagnosis Same as Pre-procedure Electronic Signature(s) Signed: 10/29/2021 5:16:25 PM By: Worthy Keeler PA-C Signed: 10/29/2021 5:46:57 PM By: Gretta Cool, BSN, RN, CWS, Kim RN, BSN Entered By: Gretta Cool, BSN, RN, CWS, Kim on 10/29/2021 11:59:06 SEFORA, TIETJE ([id number]) -------------------------------------------------------------------------------- Debridement Details Patient Name: Morgan Daniels Date of Service: 10/29/2021 11:30 AM Medical Record Number: [id number] Patient Account Number: [id number] Date of Birth/Sex: [birth year] (61 y.o. F) Treating RN: Cornell Barman Primary Care Provider: Jonetta Osgood Other Clinician: Referring Provider: Jonetta Osgood Treating Provider/Extender: Skipper Cliche in Treatment: 7 Debridement Performed for Wound #1 Left Calcaneus Assessment: Performed By: Physician Tommie Sams., PA-C Debridement Type: Debridement Severity of Tissue Pre Debridement: Fat layer exposed Level of Consciousness (Pre- Awake and Alert procedure): Pre-procedure Verification/Time Out Yes - 11:55 Taken: Total Area Debrided (L x W): 0.6 (cm) x 0.7 (cm) = 0.42 (cm) Tissue and other material Non-Viable, Callus debrided: Level: Non-Viable Tissue Debridement Description: Selective/Open Wound Instrument: Curette Bleeding: Minimum Hemostasis Achieved: Pressure Response to Treatment: Procedure was tolerated well Level of Consciousness (Post- Awake and Alert procedure): Post Debridement Measurements of Total Wound Length: (cm) 0.2 Width: (cm) 0.2 Depth: (cm) 0.1 Volume: (cm) 0.003 Character of Wound/Ulcer Post Debridement: Stable Severity of Tissue Post Debridement: Fat layer exposed Post Procedure Diagnosis Same as  Pre-procedure Electronic Signature(s) Signed: 10/29/2021 5:16:25 PM By: Worthy Keeler PA-C Signed: 10/29/2021 5:46:57 PM By: Gretta Cool, BSN, RN, CWS, Kim RN, BSN Entered By: Gretta Cool, BSN, RN, CWS, Kim on 10/29/2021 12:00:16 BEOLA, VASALLO ([id number]) -------------------------------------------------------------------------------- HPI Details Patient Name: Morgan Daniels Date of Service: 10/29/2021 11:30 AM Medical Record Number: [id number] Patient Account Number: [id number] Date of Birth/Sex: 1960/06/26 (61 y.o. F) Treating RN: Cornell Barman Primary Care Provider: Jonetta Osgood Other Clinician: Referring Provider: Jonetta Osgood Treating Provider/Extender: Skipper Cliche in Treatment: 7 History of Present Illness  HPI Description: ADMISSION 09/10/2021 This is a 61 year old woman who arrived in clinic accompanied by her husband. She is here for review of a area on the tip of her left heel which has been present by her description for about 3 months. I did not see any description of this in care everywhere. She is a type II diabetic with peripheral arterial disease and peripheral neuropathy. In fact she has had 2 procedures by Dr. Lucky Cowboy of vein and vascular 1 on 01/03/2021 at which time she had a angioplasty of the left anterior tibial artery and a mechanical thrombectomy of the left SFA and proximal popliteal. She underwent that she then underwent an angioplasty of the left SFA and pop and proximal popliteal and a stent in the left SFA. On 02/28/2021 she again went underwent a mechanical thrombectomy of the left SFA and popliteal arteries and a stent placement in the left SFA and proximal popliteal as well as reangioplasty of the left anterior tibial. The patient states that the area on the left heel started about 3 months ago it is scabbed over and she cannot get this to heal. She is using a foot cream that was given to her by podiatry [foot miracle]. She also has an area on the right heel  medially almost mirror image to the left although there is no open wound here the skin in the area is dry and scaly. There is no evidence of infection Past medical history includes type 2 diabetes with PAD and peripheral neuropathy, coronary artery disease status post CABG x4, depression, hypertension, hypercholesterolemia, irritable bowel, carotid artery stenosis 09-24-2021 upon evaluation today patient appears to be doing well currently in regard to her wound as far as infection is concerned there is no signs of active infection at this time which is great news. With that being said she is still having significant pain here. Unfortunately I think this is more neuropathic in nature and is not to be a whole lot to do for this except for to get the wound healed so that there is no source of irritation and inflammation. With that being said I do believe that we need to perform debridement to do this will probably need to numb her. 10-01-2021 upon evaluation today patient appears to be doing well currently in regard to her heel ulcer. This is actually significantly improved compared to last week's visit. I am very pleased with where things stand and I do not think there is any evidence of active infection at this time which is great news. No fevers, chills, nausea, vomiting, or diarrhea. 10-15-2021 upon evaluation today patient appears to be doing well currently in regard to her heel ulcer. She has been tolerating the dressing changes without complication. Fortunately there does not appear to be any evidence of active infection at this time which is great news. No fevers, chills, nausea, vomiting, or diarrhea. 10-29-2021 upon evaluation today patient appears to be doing well currently in regard to her wound on the left heel this is very dry but actually appears to be healing quite nicely. Fortunately I do not see any signs of active infection at this time which is great news. No fevers, chills,  nausea, vomiting, or diarrhea. Electronic Signature(s) Signed: 10/29/2021 1:26:13 PM By: Worthy Keeler PA-C Entered By: Worthy Keeler on 10/29/2021 13:26:13 ANNEKE, CUNDY ([id number]) -------------------------------------------------------------------------------- Physical Exam Details Patient Name: Morgan Daniels Date of Service: 10/29/2021 11:30 AM Medical Record Number: [id number] Patient Account Number: [id number] Date of  Birth/Sex: 10/09/1960 (61 y.o. F) Treating RN: Cornell Barman Primary Care Provider: Jonetta Osgood Other Clinician: Referring Provider: Jonetta Osgood Treating Provider/Extender: Skipper Cliche in Treatment: 7 Constitutional Well-nourished and well-hydrated in no acute distress. Respiratory normal breathing without difficulty. Psychiatric this patient is able to make decisions and demonstrates good insight into disease process. Alert and Oriented x 3. pleasant and cooperative. Notes Upon inspection patient's wound bed actually showed signs of good granulation and epithelization at this point. Fortunately I see no evidence of active infection which is great news and overall I am extremely pleased with where we stand today. With regard to the right heel this is an area that seems to have a small pus pocket I think there is actually something that is gotten lodged in the heel at this point. Nonetheless I was able to clearway the callus external which remove the pus most of the area underneath was new skin growth but did appear to be a small area where something had been embedded where there is an opening this is fairly tiny nonetheless. Overall I think it should hopefully heal quite readily. Electronic Signature(s) Signed: 10/29/2021 1:27:25 PM By: Worthy Keeler PA-C Entered By: Worthy Keeler on 10/29/2021 13:27:25 BREEONA, WAID ([id number]) -------------------------------------------------------------------------------- Physician Orders  Details Patient Name: Morgan Daniels Date of Service: 10/29/2021 11:30 AM Medical Record Number: [id number] Patient Account Number: [id number] Date of Birth/Sex: January 11, 1961 (61 y.o. F) Treating RN: Cornell Barman Primary Care Provider: Jonetta Osgood Other Clinician: Referring Provider: Jonetta Osgood Treating Provider/Extender: Skipper Cliche in Treatment: 7 Verbal / Phone Orders: No Diagnosis Coding ICD-10 Coding Code Description E11.621 Type 2 diabetes mellitus with foot ulcer L97.528 Non-pressure chronic ulcer of other part of left foot with other specified severity E11.51 Type 2 diabetes mellitus with diabetic peripheral angiopathy without gangrene E11.42 Type 2 diabetes mellitus with diabetic polyneuropathy Follow-up Appointments o Return Appointment in 2 weeks. o Nurse Visit as needed Bathing/ Shower/ Hygiene o May shower; gently cleanse wound with antibacterial soap, rinse and pat dry prior to dressing wounds Anesthetic (Use 'Patient Medications' Section for Anesthetic Order Entry) o Lidocaine applied to wound bed Off-Loading Wound #1 Left Calcaneus o Open toe surgical shoe Wound Treatment Wound #1 - Calcaneus Wound Laterality: Left Primary Dressing: Xeroform-HBD 2x2 (in/in) (DME) (Generic) 1 x Per Day/30 Days Discharge Instructions: Apply Xeroform-HBD 2x2 (in/in) as directed Wound #2 - Calcaneus Wound Laterality: Plantar, Right Primary Dressing: Xeroform-HBD 2x2 (in/in) (DME) (Generic) 1 x Per Day/30 Days Discharge Instructions: Apply Xeroform-HBD 2x2 (in/in) as directed Secured With: Conform 2''- Conforming Stretch Gauze Bandage 2x75 (in/in) (Generic) 1 x Per Day/30 Days Discharge Instructions: Apply as directed Electronic Signature(s) Signed: 10/29/2021 5:16:25 PM By: Worthy Keeler PA-C Signed: 10/29/2021 5:46:57 PM By: Gretta Cool, BSN, RN, CWS, Kim RN, BSN Entered By: Gretta Cool, BSN, RN, CWS, Kim on 10/29/2021 12:02:07 RASHIA, MCKESSON  ([id number]) -------------------------------------------------------------------------------- Problem List Details Patient Name: Morgan Daniels Date of Service: 10/29/2021 11:30 AM Medical Record Number: [id number] Patient Account Number: [id number] Date of Birth/Sex: Dec 17, 1960 (61 y.o. 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 10/29/2021 No  Yes exposed E11.51 Type 2 diabetes mellitus with diabetic peripheral angiopathy without 09/10/2021 No Yes gangrene E11.42 Type 2 diabetes mellitus with diabetic polyneuropathy 09/10/2021 No Yes Inactive Problems Resolved Problems Electronic Signature(s) Signed: 10/29/2021 1:32:45 PM By: Worthy Keeler PA-C Previous Signature: 10/29/2021 11:28:01 AM Version By: Worthy Keeler PA-C Entered By: Worthy Keeler on 10/29/2021 13:32:45 Morgan Daniels ([id number]) -------------------------------------------------------------------------------- Progress Note Details Patient Name: Morgan Daniels Date of Service: 10/29/2021 11:30 AM Medical Record Number: [id number] Patient Account Number: [id number] Date of Birth/Sex: [birth year] (61 y.o. F) Treating RN: Cornell Barman Primary Care Provider: Jonetta Osgood Other Clinician: Referring Provider: Jonetta Osgood Treating Provider/Extender: Skipper Cliche in Treatment: 7 Subjective Chief Complaint Information obtained from Patient 09/11/2022; patient comes in for review of an area on the tip of her left heel History of Present Illness  (HPI) ADMISSION 09/10/2021 This is a 61 year old woman who arrived in clinic accompanied by her husband. She is here for review of a area on the tip of her left heel which has been present by her description for about 3 months. I did not see any description of this in care everywhere. She is a type II diabetic with peripheral arterial disease and peripheral neuropathy. In fact she has had 2 procedures by Dr. Lucky Cowboy of vein and vascular 1 on 01/03/2021 at which time she had a angioplasty of the left anterior tibial artery and a mechanical thrombectomy of the left SFA and proximal popliteal. She underwent that she then underwent an angioplasty of the left SFA and pop and proximal popliteal and a stent in the left SFA. On 02/28/2021 she again went underwent a mechanical thrombectomy of the left SFA and popliteal arteries and a stent placement in the left SFA and proximal popliteal as well as reangioplasty of the left anterior tibial. The patient states that the area on the left heel started about 3 months ago it is scabbed over and she cannot get this to heal. She is using a foot cream that was given to her by podiatry [foot [facility]]. She also has an area on the right heel medially almost mirror image to the left although there is no open wound here the skin in the area is dry and scaly. There is no evidence of infection Past medical history includes type 2 diabetes with PAD and peripheral neuropathy, coronary artery disease status post CABG x4, depression, hypertension, hypercholesterolemia, irritable bowel, carotid artery stenosis 09-24-2021 upon evaluation today patient appears to be doing well currently in regard to her wound as far as infection is concerned there is no signs of active infection at this time which is great news. With that being said she is still having significant pain here. Unfortunately I think this is more neuropathic in nature and is not to be a whole lot to do for this except for to  get the wound healed so that there is no source of irritation and inflammation. With that being said I do believe that we need to perform debridement to do this will probably need to numb her. 10-01-2021 upon evaluation today patient appears to be doing well currently in regard to her heel ulcer. This is actually significantly improved compared to last week's visit. I am very pleased with where things stand and I do not think there is any evidence of active infection at this time which is great news. No fevers, chills, nausea, vomiting, or diarrhea. 10-15-2021 upon evaluation today patient appears to be doing well currently in  regard to her heel ulcer. She has been tolerating the dressing changes without complication. Fortunately there does not appear to be any evidence of active infection at this time which is great news. No fevers, chills, nausea, vomiting, or diarrhea. 10-29-2021 upon evaluation today patient appears to be doing well currently in regard to her wound on the left heel this is very dry but actually appears to be healing quite nicely. Fortunately I do not see any signs of active infection at this time which is great news. No fevers, chills, nausea, vomiting, or diarrhea. Objective Constitutional Well-nourished and well-hydrated in no acute distress. Vitals Time Taken: 11:31 AM, Height: 67 in, Weight: 200 lbs, BMI: 31.3, Temperature: 98.4 F, Pulse: 67 bpm, Respiratory Rate: 18 breaths/min, Blood Pressure: 162/85 mmHg. Respiratory normal breathing without difficulty. Psychiatric this patient is able to make decisions and demonstrates good insight into disease process. Alert and Oriented x 3. pleasant and cooperative. ANINA, SCHNAKE ([id number]) General Notes: Upon inspection patient's wound bed actually showed signs of good granulation and epithelization at this point. Fortunately I see no evidence of active infection which is great news and overall I am extremely pleased with  where we stand today. With regard to the right heel this is an area that seems to have a small pus pocket I think there is actually something that is gotten lodged in the heel at this point. Nonetheless I was able to clearway the callus external which remove the pus most of the area underneath was new skin growth but did appear to be a small area where something had been embedded where there is an opening this is fairly tiny nonetheless. Overall I think it should hopefully heal quite readily. Integumentary (Hair, Skin) Wound #1 status is Open. Original cause of wound was Gradually Appeared. The date acquired was: 04/17/2021. The wound has been in treatment 7 weeks. The wound is located on the Left Calcaneus. The wound measures 0.2cm length x 0.2cm width x 0.1cm depth; 0.031cm^2 area and 0.003cm^3 volume. There is Fat Layer (Subcutaneous Tissue) exposed. There is a medium amount of serosanguineous drainage noted. The wound margin is distinct with the outline attached to the wound base. There is small (1-33%) pink granulation within the wound bed. There is a large (67-100%) amount of necrotic tissue within the wound bed including Adherent Slough. Wound #2 status is Open. Original cause of wound was Gradually Appeared. The date acquired was: 10/22/2021. The wound is located on the Right,Plantar Calcaneus. The wound measures 0.2cm length x 0.3cm width x 0.2cm depth; 0.047cm^2 area and 0.009cm^3 volume. There is Fat Layer (Subcutaneous Tissue) exposed. There is a medium amount of serous drainage noted. The wound margin is flat and intact. There is no granulation within the wound bed. There is a large (67-100%) amount of necrotic tissue within the wound bed including Adherent Slough. 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 There was a Selective/Open Wound Non-Viable Tissue Debridement with a total area of 0.42 sq cm performed by Tommie Sams., PA-C. With the following instrument(s): Curette  to remove Non-Viable tissue/material. Material removed includes Callus. No specimens were taken. A time out was conducted at 11:55, prior to the start of the procedure. A Minimum amount of bleeding was controlled with Pressure. The procedure was tolerated well. Post Debridement Measurements: 0.2cm length x 0.2cm width x 0.1cm depth; 0.003cm^3 volume. Character of Wound/Ulcer Post Debridement is stable. Severity of Tissue Post Debridement is: Fat layer exposed. Post procedure Diagnosis Wound #1: Same as Pre-Procedure Wound #2 Pre-procedure diagnosis of Wound #2 is a Diabetic Wound/Ulcer of the Lower Extremity located on the Right,Plantar Calcaneus .Severity of Tissue Pre Debridement is: Fat layer exposed. There was a Excisional Skin/Subcutaneous Tissue Debridement with a total area of 0.09 sq cm performed by Tommie Sams., PA-C. With the following instrument(s): Curette to remove Viable tissue/material. Material removed includes Subcutaneous Tissue and Fibrin/Exudate and. No specimens were taken. A time out was conducted at 11:55, prior to the start of the procedure. A Minimum amount of bleeding was controlled with Pressure. The procedure was tolerated well. Post Debridement Measurements: 0.3cm length x 0.3cm width x 0.2cm depth; 0.014cm^3 volume. Character of Wound/Ulcer Post Debridement is stable. Severity of Tissue Post Debridement is: Fat layer exposed. Post procedure Diagnosis Wound #2: Same as  Pre-Procedure Plan Follow-up Appointments: Return Appointment in 2 weeks. Nurse Visit as needed Bathing/ Shower/ Hygiene: May shower; gently cleanse wound with antibacterial soap, rinse and pat dry prior to dressing wounds Anesthetic (Use 'Patient Medications' Section for Anesthetic Order Entry): Lidocaine applied to wound bed Off-Loading: JUBILEE, VIVERO. ([id number]) Wound #1 Left Calcaneus: Open toe surgical shoe WOUND #1: - Calcaneus Wound Laterality: Left Primary Dressing: Xeroform-HBD 2x2 (in/in) (DME) (Generic) 1 x Per Day/30 Days Discharge Instructions: Apply Xeroform-HBD 2x2 (in/in) as directed WOUND #2: - Calcaneus Wound Laterality: Plantar, Right Primary Dressing: Xeroform-HBD 2x2 (in/in) (DME) (Generic) 1 x Per Day/30 Days Discharge Instructions: Apply Xeroform-HBD 2x2 (in/in) as directed Secured With: Conform 2''- Conforming Stretch Gauze Bandage 2x75 (in/in) (Generic) 1 x Per Day/30 Days Discharge Instructions: Apply as directed 1. I would recommend currently that we go ahead and continue with the recommendation for wound care measures as before and the patient is in agreement with plan. This includes the use of the Xeroform gauze dressing to the heel on both sides which I think is doing well. 2. Also can recommend that we have the patient continue with the gauze and roll gauze to secure in place which I think should do quite well. We will see patient back for reevaluation in 1 week here in the clinic. If anything worsens or changes patient will contact our office for additional recommendations. Electronic Signature(s) Signed: 10/29/2021 1:32:59 PM By: Worthy Keeler PA-C Previous Signature: 10/29/2021 1:28:00 PM Version By: Worthy Keeler PA-C Entered By: Worthy Keeler on 10/29/2021 13:32:59 Morgan Daniels ([id number]) -------------------------------------------------------------------------------- SuperBill Details Patient Name: Morgan Daniels Date of Service:  10/29/2021 Medical Record Number: [id number] Patient Account Number: [id number] Date of Birth/Sex: [birth year] (61 y.o. F) Treating RN: Cornell Barman Primary Care Provider: Jonetta Osgood Other Clinician: Referring Provider: Jonetta Osgood Treating Provider/Extender: Skipper Cliche in Treatment: 7 Diagnosis Coding ICD-10 Codes Code Description E11.621 Type 2 diabetes mellitus with foot ulcer L97.528 Non-pressure chronic ulcer of other part of left foot with other specified severity L97.412 Non-pressure chronic ulcer of right heel and midfoot with fat layer exposed E11.51 Type 2 diabetes mellitus with diabetic peripheral angiopathy without gangrene E11.42 Type 2 diabetes mellitus with diabetic polyneuropathy Facility Procedures CPT4 Code:  [id number] Description: 00349 - DEB SUBQ TISSUE 20 SQ CM/< Modifier: Quantity: 1 CPT4 Code: Description: ICD-10 Diagnosis Description L97.528 Non-pressure chronic ulcer of other part of left foot with other specified Modifier: severity Quantity: CPT4 Code: [id number] Description: 97948 - DEBRIDE WOUND 1ST 20 SQ CM OR < Modifier: Quantity: 1 CPT4 Code: Description: ICD-10 Diagnosis Description 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 Modifier: Quantity: Physician Procedures CPT4 Code: 7482707 Description: 86754 - WC PHYS SUBQ TISS 20 SQ CM Modifier: Quantity: 1 CPT4 Code: Description: ICD-10 Diagnosis Description 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

## 2021-11-01 ENCOUNTER — Other Ambulatory Visit: Payer: Self-pay | Admitting: Nurse Practitioner

## 2021-11-01 ENCOUNTER — Other Ambulatory Visit: Payer: Self-pay | Admitting: "Endocrinology

## 2021-11-01 DIAGNOSIS — E1142 Type 2 diabetes mellitus with diabetic polyneuropathy: Secondary | ICD-10-CM

## 2021-11-01 DIAGNOSIS — B3731 Acute candidiasis of vulva and vagina: Secondary | ICD-10-CM

## 2021-11-07 DIAGNOSIS — I63433 Cerebral infarction due to embolism of bilateral posterior cerebral arteries: Secondary | ICD-10-CM | POA: Diagnosis not present

## 2021-11-11 ENCOUNTER — Encounter: Payer: Medicare Other | Admitting: Physician Assistant

## 2021-11-11 DIAGNOSIS — S91301A Unspecified open wound, right foot, initial encounter: Secondary | ICD-10-CM | POA: Diagnosis not present

## 2021-11-11 NOTE — Progress Notes (Signed)
Morgan Daniels, Morgan Daniels ([id number]) Visit Report for 11/11/2021 Arrival Information Details Patient Name: Morgan Daniels Date of Service: 11/11/2021 10:45 AM Medical Record Number: [id number] Patient Account Number: [id number] Date of Birth/Sex: [birth year] (61 y.o. F) Treating RN: Morgan Daniels Primary Care Morgan Daniels: Morgan Daniels Other Clinician: Referring Morgan Daniels: Morgan Daniels Treating Morgan Daniels/Extender: Morgan Daniels in Treatment: 8 Visit Information History Since Last Visit Added or deleted any medications: No Patient Arrived: Ambulatory Has Dressing in Place as Prescribed: Yes Arrival Time: 11:04 Pain Present Now: No Accompanied By: husband Transfer Assistance: None Patient Requires Transmission-Based No Precautions: Patient Has Alerts: Yes Patient Alerts: Patient on Blood Thinner Type II Diabetic Aspirin '81mg'$  [facility] 04/01/20 ABI R 1.02 L 1.04 TBI R 0.89 L 0.87 Electronic Signature(s) Signed: 11/11/2021 5:56:59 PM By: Gretta Cool, BSN, RN, CWS, Kim RN, BSN Entered By: Gretta Cool, BSN, RN, CWS, Kim on 11/11/2021 11:06:22 Morgan Daniels ([id number]) -------------------------------------------------------------------------------- Lower Extremity Assessment Details Patient Name: Morgan Daniels Date of Service: 11/11/2021 10:45 AM Medical Record Number: [id number] Patient Account Number: [id number] Date of Birth/Sex: 10-Mar-1960 (61 y.o. F) Treating RN: Morgan Daniels Primary Care Morgan Daniels: Morgan Daniels Other Clinician: Referring Morgan Daniels: Morgan Daniels Treating Morgan Daniels/Extender: Morgan Daniels in Treatment: 8 Edema Assessment Assessed: [Left: Yes] [Right: Yes] Edema: [Left: No] [Right: No] Vascular Assessment Pulses: Dorsalis Pedis Palpable: [Left:Yes] [Right:Yes] Notes Patient arrived today in her bedroom shoes vs. flip flops. States her feet feel much better. Electronic Signature(s) Signed: 11/11/2021 5:56:59 PM By: Gretta Cool, BSN, RN, CWS, Kim RN, BSN Entered By:  Gretta Cool, BSN, RN, CWS, Kim on 11/11/2021 11:18:14 [name], [name] ([id number]) -------------------------------------------------------------------------------- Multi Wound Chart Details Patient Name: Morgan Daniels Date of Service: 11/11/2021 10:45 AM Medical Record Number: [id number] Patient Account Number: [id number] Date of Birth/Sex: August 26, 1960 (61 y.o. F) Treating RN: Morgan Daniels Primary Care Orella Cushman: Morgan Daniels Other Clinician: Referring Morgan Daniels: Morgan Daniels Treating Morgan Daniels/Extender: Morgan Daniels in Treatment: 8 Vital Signs Height(in): 10 Pulse(bpm): 61 Weight(lbs): 200 Blood Pressure(mmHg): 146/75 Body Mass Index(BMI): 31.3 Temperature(F): 98.4 Respiratory Rate(breaths/min): 18 Photos: [N/A:N/A] Wound Location: Left Calcaneus Right, Plantar Calcaneus N/A Wounding Event: Gradually Appeared Gradually Appeared N/A Primary Etiology: Diabetic Wound/Ulcer of the Lower Diabetic Wound/Ulcer of the Lower N/A Extremity Extremity Comorbid History: Coronary Artery Disease, Coronary Artery Disease, N/A Hypertension, Myocardial Infarction, Hypertension, Myocardial Infarction, Type II Diabetes, Neuropathy Type II Diabetes, Neuropathy Date Acquired: 04/17/2021 10/22/2021 N/A Weeks of Treatment: 8 1 N/A Wound Status: Open Open N/A Wound Recurrence: No No N/A Measurements L x W x D (cm) 0.1x0.1x0.1 0.1x0.1x0.1 N/A Area (cm) : 0.008 0.008 N/A Volume (cm) : 0.001 0.001 N/A % Reduction in Area: 99.40% 83.00% N/A % Reduction in Volume: 99.30% 88.90% N/A Classification: Grade 1 Grade 1 N/A Exudate Amount: None Present Small N/A Exudate Type: N/A Serous N/A Exudate Color: N/A amber N/A Wound Margin: Distinct, outline attached Flat and Intact N/A Granulation Amount: None Present (0%) Large (67-100%) N/A Necrotic Amount: Large (67-100%) None Present (0%) N/A Necrotic Tissue: Eschar N/A N/A Exposed Structures: Fascia: No Fat Layer (Subcutaneous Tissue): N/A Fat Layer  (Subcutaneous Tissue): Yes No Tendon: No Muscle: No Joint: No Bone: No Epithelialization: Large (67-100%) Large (67-100%) N/A Treatment Notes Electronic Signature(s) Signed: 11/11/2021 5:56:59 PM By: Gretta Cool, BSN, RN, CWS, Kim RN, BSN Entered By: Gretta Cool, BSN, RN, CWS, Kim on 11/11/2021 11:23:26 [name], [name] ([id number], NULL ([id number]) -------------------------------------------------------------------------------- Multi-Disciplinary Care Plan Details Patient Name: Morgan Daniels Date of Service: 11/11/2021 10:45 AM Medical Record Number: [id number] Patient Account Number:  [id number] Date of Birth/Sex: March 07, 1960 (61 y.o. F) Treating RN: Morgan Daniels Primary Care Dalores Weger: Morgan Daniels Other Clinician: Referring Morgan Daniels: Morgan Daniels Treating Morgan Daniels/Extender: Morgan Daniels in Treatment: 8 Active Inactive Necrotic Tissue Nursing Diagnoses: Impaired tissue integrity related to necrotic/devitalized tissue Knowledge deficit related to management of necrotic/devitalized tissue Goals: Necrotic/devitalized tissue will be minimized in the wound bed Date Initiated: 09/10/2021 Target Resolution Date: 09/10/2021 Goal Status: Active Patient/caregiver will verbalize understanding of reason and process for debridement of necrotic tissue Date Initiated: 09/10/2021 Target Resolution Date: 09/10/2021 Goal Status: Active Interventions: Assess patient pain level pre-, during and post procedure and prior to discharge Provide education on necrotic tissue and debridement process Treatment Activities: Excisional debridement : 09/10/2021 Notes: Orientation to the Wound Care Program Nursing Diagnoses: Knowledge deficit related to the wound healing center program Goals: Patient/caregiver will verbalize understanding of the [place] Date Initiated: 09/10/2021 Target Resolution Date: 09/10/2021 Goal Status: Active Interventions: Provide education on  orientation to the wound center Notes: Wound/Skin Impairment Nursing Diagnoses: Impaired tissue integrity Goals: Patient/caregiver will verbalize understanding of skin care regimen Date Initiated: 09/10/2021 Target Resolution Date: 09/10/2021 Goal Status: Active Ulcer/skin breakdown will have a volume reduction of 30% by week 4 Date Initiated: 09/10/2021 Target Resolution Date: 10/08/2021 Goal Status: Active Interventions: Assess patient/caregiver ability to obtain necessary supplies Assess patient/caregiver ability to perform ulcer/skin care regimen upon admission and as needed ORINE, GOGA ([id number]) Assess ulceration(s) every visit Treatment Activities: Skin care regimen initiated : 09/10/2021 Topical wound management initiated : 09/10/2021 Notes: Electronic Signature(s) Signed: 11/11/2021 5:56:59 PM By: Gretta Cool, BSN, RN, CWS, Kim RN, BSN Entered By: Gretta Cool, BSN, RN, CWS, Kim on 11/11/2021 11:18:22 MARGURETTE, BRENER ([id number]) -------------------------------------------------------------------------------- Pain Assessment Details Patient Name: Morgan Daniels Date of Service: 11/11/2021 10:45 AM Medical Record Number: [id number] Patient Account Number: [id number] Date of Birth/Sex: 10-24-1960 (61 y.o. F) Treating RN: Morgan Daniels Primary Care Latiya Navia: Morgan Daniels Other Clinician: Referring Marvis Saefong: Morgan Daniels Treating Latashia Koch/Extender: Morgan Daniels in Treatment: 8 Active Problems Location of Pain Severity and Description of Pain Patient Has Paino No Site Locations Pain Management and Medication Current Pain Management: Notes Patient denies pain at this time. Electronic Signature(s) Signed: 11/11/2021 5:56:59 PM By: Gretta Cool, BSN, RN, CWS, Kim RN, BSN Entered By: Gretta Cool, BSN, RN, CWS, Kim on 11/11/2021 11:09:32 MELEK, POWNALL ([id number]) -------------------------------------------------------------------------------- Wound Assessment Details Patient  Name: Morgan Daniels Date of Service: 11/11/2021 10:45 AM Medical Record Number: [id number] Patient Account Number: [id number] Date of Birth/Sex: [birth year] (61 y.o. F) Treating RN: Morgan Daniels Primary Care Breean Nannini: Morgan Daniels Other Clinician: Referring Zakaree Mcclenahan: Morgan Daniels Treating Joniyah Mallinger/Extender: Morgan Daniels in Treatment: 8 Wound Status Wound Number: 1 Primary Diabetic Wound/Ulcer of the Lower Extremity Etiology: Wound Location: Left Calcaneus Wound Healed - Epithelialized Wounding Event: Gradually Appeared Status: Date Acquired: 04/17/2021 Comorbid Coronary Artery Disease, Hypertension, Myocardial Weeks Of Treatment: 8 History: Infarction, Type II Diabetes, Neuropathy Clustered Wound: No Photos Wound Measurements Length: (cm) 0 Width: (cm) 0 Depth: (cm) 0 Area: (cm) 0 Volume: (cm) 0 % Reduction in Area: 100% % Reduction in Volume: 100% Epithelialization: Large (67-100%) Tunneling: No Undermining: No Wound Description Classification: Grade 1 Wound Margin: Distinct, outline attached Exudate Amount: None Present Foul Odor After Cleansing: No Slough/Fibrino No Wound Bed Granulation Amount: None Present (0%) Exposed Structure Necrotic Amount: Large (67-100%) Fascia Exposed: No Necrotic Quality: Eschar Fat Layer (Subcutaneous Tissue) Exposed: No Tendon Exposed: No Muscle Exposed: No Joint Exposed: No Bone Exposed: No Electronic  Signature(s) Signed: 11/11/2021 5:56:59 PM By: Gretta Cool, BSN, RN, CWS, Kim RN, BSN Entered By: Gretta Cool, BSN, RN, CWS, Kim on 11/11/2021 11:23:49 MARGUARITE, MARKOV ([id number]) -------------------------------------------------------------------------------- Wound Assessment Details Patient Name: Morgan Daniels Date of Service: 11/11/2021 10:45 AM Medical Record Number: [id number] Patient Account Number: [id number] Date of Birth/Sex: 10-Dec-1960 (61 y.o. F) Treating RN: Morgan Daniels Primary Care Lauran Romanski: Morgan Daniels Other  Clinician: Referring Caidon Foti: Morgan Daniels Treating Barbera Perritt/Extender: Morgan Daniels in Treatment: 8 Wound Status Wound Number: 2 Primary Diabetic Wound/Ulcer of the Lower Extremity Etiology: Wound Location: Right, Plantar Calcaneus Wound Open Wounding Event: Gradually Appeared Status: Date Acquired: 10/22/2021 Comorbid Coronary Artery Disease, Hypertension, Myocardial Weeks Of Treatment: 1 History: Infarction, Type II Diabetes, Neuropathy Clustered Wound: No Photos Wound Measurements Length: (cm) 0.1 Width: (cm) 0.1 Depth: (cm) 0.1 Area: (cm) 0.008 Volume: (cm) 0.001 % Reduction in Area: 83% % Reduction in Volume: 88.9% Epithelialization: Large (67-100%) Tunneling: No Undermining: No Wound Description Classification: Grade 1 Wound Margin: Flat and Intact Exudate Amount: Small Exudate Type: Serous Exudate Color: amber Foul Odor After Cleansing: No Slough/Fibrino No Wound Bed Granulation Amount: Large (67-100%) Exposed Structure Necrotic Amount: None Present (0%) Fat Layer (Subcutaneous Tissue) Exposed: Yes Electronic Signature(s) Signed: 11/11/2021 5:56:59 PM By: Gretta Cool, BSN, RN, CWS, Kim RN, BSN Entered By: Gretta Cool, BSN, RN, CWS, Kim on 11/11/2021 11:16:57 Morgan Daniels ([id number]) -------------------------------------------------------------------------------- Vitals Details Patient Name: Morgan Daniels Date of Service: 11/11/2021 10:45 AM Medical Record Number: [id number] Patient Account Number: [id number] Date of Birth/Sex: 07-30-1960 (61 y.o. F) Treating RN: Morgan Daniels Primary Care Cabela Pacifico: Morgan Daniels Other Clinician: Referring Lindalou Soltis: Morgan Daniels Treating Lashala Laser/Extender: Morgan Daniels in Treatment: 8 Vital Signs Time Taken: 11:08 Temperature (F): 98.4 Height (in): 67 Pulse (bpm): 61 Weight (lbs): 200 Respiratory Rate (breaths/min): 18 Body Mass Index (BMI): 31.3 Blood Pressure (mmHg): 146/75 Reference Range: 80 -  120 mg / dl Electronic Signature(s) Signed: 11/11/2021 5:56:59 PM By: Gretta Cool, BSN, RN, CWS, Kim RN, BSN Entered By: Gretta Cool, BSN, RN, CWS, Kim on 11/11/2021 11:09:12

## 2021-11-11 NOTE — Progress Notes (Addendum)
THRESSA, SHIFFER ([id number]) Visit Report for 11/11/2021 Chief Complaint Document Details Patient Name: Morgan Daniels, Morgan Daniels Date of Service: 11/11/2021 10:45 AM Medical Record Number: [id number] Patient Account Number: [id number] Date of Birth/Sex: 08-04-1960 (61 y.o. F) Treating RN: Cornell Barman Primary Care Provider: Jonetta Osgood Other Clinician: Referring Provider: Jonetta Osgood Treating Provider/Extender: Skipper Cliche in Treatment: 8 Information Obtained from: Patient Chief Complaint 09/11/2022; patient comes in for review of an area on the tip of her left heel Electronic Signature(s) Signed: 11/11/2021 11:17:27 AM By: Worthy Keeler PA-C Entered By: Worthy Keeler on 11/11/2021 11:17:27 Morgan Daniels ([id number]) -------------------------------------------------------------------------------- HPI Details Patient Name: Morgan Daniels Date of Service: 11/11/2021 10:45 AM Medical Record Number: [id number] Patient Account Number: [id number] Date of Birth/Sex: 1960-09-07 (61 y.o. F) Treating RN: Cornell Barman Primary Care Provider: Jonetta Osgood Other Clinician: Referring Provider: Jonetta Osgood Treating Provider/Extender: Skipper Cliche in Treatment: 8 History of Present Illness HPI Description: ADMISSION 09/10/2021 This is a 61 year old woman who arrived in clinic accompanied by her husband. She is here for review of a area on the tip of her left heel which has been present by her description for about 3 months. I did not see any description of this in care everywhere. She is a type II diabetic with peripheral arterial disease and peripheral neuropathy. In fact she has had 2 procedures by Dr. Lucky Cowboy of vein and vascular 1 on 01/03/2021 at which time she had a angioplasty of the left anterior tibial artery and a mechanical thrombectomy of the left SFA and proximal popliteal. She underwent that she then underwent an angioplasty of the left SFA and pop and proximal  popliteal and a stent in the left SFA. On 02/28/2021 she again went underwent a mechanical thrombectomy of the left SFA and popliteal arteries and a stent placement in the left SFA and proximal popliteal as well as reangioplasty of the left anterior tibial. The patient states that the area on the left heel started about 3 months ago it is scabbed over and she cannot get this to heal. She is using a foot cream that was given to her by podiatry [foot miracle]. She also has an area on the right heel medially almost mirror image to the left although there is no open wound here the skin in the area is dry and scaly. There is no evidence of infection Past medical history includes type 2 diabetes with PAD and peripheral neuropathy, coronary artery disease status post CABG x4, depression, hypertension, hypercholesterolemia, irritable bowel, carotid artery stenosis 09-24-2021 upon evaluation today patient appears to be doing well currently in regard to her wound as far as infection is concerned there is no signs of active infection at this time which is great news. With that being said she is still having significant pain here. Unfortunately I think this is more neuropathic in nature and is not to be a whole lot to do for this except for to get the wound healed so that there is no source of irritation and inflammation. With that being said I do believe that we need to perform debridement to do this will probably need to numb her. 10-01-2021 upon evaluation today patient appears to be doing well currently in regard to her heel ulcer. This is actually significantly improved compared to last week's visit. I am very pleased with where things stand and I do not think there is any evidence of active infection at this time which is great news. No fevers,  chills, nausea, vomiting, or diarrhea. 10-15-2021 upon evaluation today patient appears to be doing well currently in regard to her heel ulcer. She has been tolerating  the dressing changes without complication. Fortunately there does not appear to be any evidence of active infection at this time which is great news. No fevers, chills, nausea, vomiting, or diarrhea. 10-29-2021 upon evaluation today patient appears to be doing well currently in regard to her wound on the left heel this is very dry but actually appears to be healing quite nicely. Fortunately I do not see any signs of active infection at this time which is great news. No fevers, chills, nausea, vomiting, or diarrhea. 11-11-2021 upon evaluation today patient actually appears to be doing significantly better in regards to her heel ulcers. The one on the left foot is completely closed wound the right foot appears to be still has a pinpoint opening. Electronic Signature(s) Signed: 11/11/2021 11:26:18 AM By: Worthy Keeler PA-C Entered By: Worthy Keeler on 11/11/2021 11:26:18 CEDRIC, DENISON ([id number]) -------------------------------------------------------------------------------- Physical Exam Details Patient Name: Morgan Daniels Date of Service: 11/11/2021 10:45 AM Medical Record Number: [id number] Patient Account Number: [id number] Date of Birth/Sex: 1960/04/15 (61 y.o. F) Treating RN: Cornell Barman Primary Care Provider: Jonetta Osgood Other Clinician: Referring Provider: Jonetta Osgood Treating Provider/Extender: Skipper Cliche in Treatment: 8 Constitutional Well-nourished and well-hydrated in no acute distress. Respiratory normal breathing without difficulty. Psychiatric this patient is able to make decisions and demonstrates good insight into disease process. Alert and Oriented x 3. pleasant and cooperative. Notes Upon inspection patient's wound bed actually showed signs again of significant improvement with complete epithelization of the left and on the right she is significantly smaller with a pinpoint opening and no other significant issues here. Electronic  Signature(s) Signed: 11/11/2021 11:26:34 AM By: Worthy Keeler PA-C Entered By: Worthy Keeler on 11/11/2021 11:26:33 JESLYN, AMSLER ([id number]) -------------------------------------------------------------------------------- Physician Orders Details Patient Name: Morgan Daniels Date of Service: 11/11/2021 10:45 AM Medical Record Number: [id number] Patient Account Number: [id number] Date of Birth/Sex: June 08, 1960 (61 y.o. 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 o Nurse Visit as needed Bathing/ Shower/ Hygiene o May shower; gently cleanse wound with antibacterial soap, rinse and pat dry prior to dressing wounds Anesthetic (Use 'Patient Medications' Section for Anesthetic Order Entry) o Lidocaine applied to wound bed Wound Treatment Wound #2 - Calcaneus Wound Laterality: Plantar, Right Primary Dressing: Xeroform-HBD 2x2 (in/in) (Generic) 1 x Per Day/30 Days Discharge Instructions: Apply Xeroform-HBD 2x2 (in/in) as directed Secured With: Conform 2''- Conforming Stretch Gauze Bandage 2x75 (in/in) (Generic) 1 x Per Day/30 Days Discharge Instructions: Apply as directed Notes 40% Urea Cream after 30 days of healed wounds to heels. Electronic Signature(s) Signed: 11/11/2021 12:16:04 PM By: Worthy Keeler PA-C Signed: 11/11/2021 5:56:59 PM By:  Gretta Cool, BSN, RN, CWS, Kim RN, BSN Entered By: Gretta Cool, BSN, RN, CWS, Kim on 11/11/2021 11:26:30 SHALETA, RUACHO ([id number]) -------------------------------------------------------------------------------- Problem List Details Patient Name: TERILYN, SANO  P. Date of Service: 11/11/2021 10:45 AM Medical Record Number: [id number] Patient Account Number: [id number] Date of Birth/Sex: 1960/12/29 (61 y.o. F) Treating RN: Cornell Barman Primary Care Provider: Jonetta Osgood Other Clinician: Referring Provider: Jonetta Osgood Treating Provider/Extender: Skipper Cliche in Treatment: 8 Active Problems ICD-10 Encounter Code Description Active Date MDM Diagnosis E11.621 Type 2 diabetes mellitus with foot ulcer 09/10/2021 No Yes L97.528 Non-pressure chronic ulcer of other part of left foot with other specified 09/10/2021 No Yes severity L97.412 Non-pressure chronic ulcer of right heel and midfoot with fat layer 10/29/2021 No Yes exposed E11.51 Type 2 diabetes mellitus with diabetic peripheral angiopathy without 09/10/2021 No Yes gangrene E11.42 Type 2 diabetes mellitus with diabetic polyneuropathy 09/10/2021 No Yes Inactive Problems Resolved Problems Electronic Signature(s) Signed: 11/11/2021 11:16:22 AM By: Worthy Keeler PA-C Entered By: Worthy Keeler on 11/11/2021 11:16:22 Morgan Daniels ([id number]) -------------------------------------------------------------------------------- Progress Note Details Patient Name: Morgan Daniels Date of Service: 11/11/2021 10:45 AM Medical Record Number: [id number] Patient Account Number: [id number] Date of Birth/Sex: 12-17-1960 (61 y.o. F) Treating RN: Cornell Barman Primary Care Provider: Jonetta Osgood Other Clinician: Referring Provider: Jonetta Osgood Treating Provider/Extender: Skipper Cliche in Treatment: 8 Subjective Chief Complaint Information obtained from Patient 09/11/2022; patient comes in for review of an area on the tip of her  left heel History of Present Illness (HPI) ADMISSION 09/10/2021 This is a 61 year old woman who arrived in clinic accompanied by her husband. She is here for review of a area on the tip of her left heel which has been present by her description for about 3 months. I did not see any description of this in care everywhere. She is a type II diabetic with peripheral arterial disease and peripheral neuropathy. In fact she has had 2 procedures by Dr. Lucky Cowboy of vein and vascular 1 on 01/03/2021 at which time she had a angioplasty of the left anterior tibial artery and a mechanical thrombectomy of the left SFA and proximal popliteal. She underwent that she then underwent an angioplasty of the left SFA and pop and proximal popliteal and a stent in the left SFA. On 02/28/2021 she again went underwent a mechanical thrombectomy of the left SFA and popliteal arteries and a stent placement in the left SFA and proximal popliteal as well as reangioplasty of the left anterior tibial. The patient states that the area on the left heel started about 3 months ago it is scabbed over and she cannot get this to heal. She is using a foot cream that was given to her by podiatry [foot miracle]. She also has an area on the right heel medially almost mirror image to the left although there is no open wound here the skin in the area is dry and scaly. There is no evidence of infection Past medical history includes type 2 diabetes with PAD and peripheral neuropathy, coronary artery disease status post CABG x4, depression, hypertension, hypercholesterolemia, irritable bowel, carotid artery stenosis 09-24-2021 upon evaluation today patient appears to be doing well currently in regard to her wound as far as infection is concerned there is no signs of active infection at this time which is great news. With that being said she is still having significant pain here. Unfortunately I think this is more neuropathic in nature and is not to be a  whole lot to do for this except for to get the wound healed so that there is no source of irritation and inflammation. With that being said I do believe that we  need to perform debridement to do this will probably need to numb her. 10-01-2021 upon evaluation today patient appears to be doing well currently in regard to her heel ulcer. This is actually significantly improved compared to last week's visit. I am very pleased with where things stand and I do not think there is any evidence of active infection at this time which is great news. No fevers, chills, nausea, vomiting, or diarrhea. 10-15-2021 upon evaluation today patient appears to be doing well currently in regard to her heel ulcer. She has been tolerating the dressing changes without complication. Fortunately there does not appear to be any evidence of active infection at this time which is great news. No fevers, chills, nausea, vomiting, or diarrhea. 10-29-2021 upon evaluation today patient appears to be doing well currently in regard to her wound on the left heel this is very dry but actually appears to be healing quite nicely. Fortunately I do not see any signs of active infection at this time which is great news. No fevers, chills, nausea, vomiting, or diarrhea. 11-11-2021 upon evaluation today patient actually appears to be doing significantly better in regards to her heel ulcers. The one on the left foot is completely closed wound the right foot appears to be still has a pinpoint opening. Objective Constitutional Well-nourished and well-hydrated in no acute distress. Vitals Time Taken: 11:08 AM, Height: 67 in, Weight: 200 lbs, BMI: 31.3, Temperature: 98.4 F, Pulse: 61 bpm, Respiratory Rate: 18 breaths/min, Blood Pressure: 146/75 mmHg. Respiratory normal breathing without difficulty. SALVADOR, BIGBEE ([id number]) Psychiatric this patient is able to make decisions and demonstrates good insight into disease process. Alert and Oriented  x 3. pleasant and cooperative. General Notes: Upon inspection patient's wound bed actually showed signs again of significant improvement with complete epithelization of the left and on the right she is significantly smaller with a pinpoint opening and no other significant issues here. Integumentary (Hair, Skin) Wound #1 status is Healed - Epithelialized. Original cause of wound was Gradually Appeared. The date acquired was: 04/17/2021. The wound has been in treatment 8 weeks. The wound is located on the Left Calcaneus. The wound measures 0cm length x 0cm width x 0cm depth; 0cm^2 area and 0cm^3 volume. There is no tunneling or undermining noted. There is a none present amount of drainage noted. The wound margin is distinct with the outline attached to the wound base. There is no granulation within the wound bed. There is a large (67-100%) amount of necrotic tissue within the wound bed including Eschar. Wound #2 status is Open. Original cause of wound was Gradually Appeared. The date acquired was: 10/22/2021. The wound has been in treatment 1 weeks. The wound is located on the Right,Plantar Calcaneus. The wound measures 0.1cm length x 0.1cm width x 0.1cm depth; 0.008cm^2 area and 0.001cm^3 volume. There is Fat Layer (Subcutaneous Tissue) exposed. There is no tunneling or undermining noted. There is a small amount of serous drainage noted. The wound margin is flat and intact. There is large (67-100%) granulation within the wound bed. There is no necrotic tissue within the wound bed. 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 Nurse Visit as needed Bathing/ Shower/ Hygiene:  May shower; gently cleanse wound with antibacterial soap, rinse and pat dry prior to dressing wounds Anesthetic (Use 'Patient Medications' Section for Anesthetic Order Entry): Lidocaine applied to wound bed General Notes: 40% Urea Cream after 30 days of healed wounds to heels. WOUND #2: - Calcaneus Wound Laterality: Plantar, Right Primary Dressing: Xeroform-HBD 2x2 (in/in) (Generic) 1 x Per Day/30 Days Discharge Instructions: Apply Xeroform-HBD 2x2 (in/in) as directed Secured With: Conform 2''- Conforming Stretch Gauze Bandage 2x75 (in/in) (Generic) 1 x Per Day/30 Days Discharge Instructions: Apply as directed 1. Based on what I am seeing I would recommend that we have the patient go ahead and with regard to the left heel this is completely closed, to suggest Eucerin cream over this and then just use of the sock. 2. Continue with the Xeroform gauze dressing to the right heel I think this is doing well hopefully she will be healed by next week. 3. I would also recommend that she continue with the new shoes and making sure that she wears socks. Again as long as she is wearing socks and right now she has better when she was on antibiotics she tells me she is also been able to wear tennis shoes without I think this is going to be better for her than as well. We will see patient back for reevaluation in 1 week here in the clinic. If anything worsens or changes patient will contact our office for additional recommendations. Electronic Signature(s) Signed: 11/11/2021 11:28:05 AM By: Worthy Keeler PA-C Entered By: Worthy Keeler on 11/11/2021 11:28:04 MAILA, DUKES ([id number], CORNACCHIA ([id number]) -------------------------------------------------------------------------------- SuperBill Details Patient Name: Morgan Daniels Date of Service: 11/11/2021 Medical Record Number: [id number] Patient Account Number: [id number] Date of  Birth/Sex: [birth year] (61 y.o. 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 E11.51 Type 2 diabetes mellitus with diabetic peripheral angiopathy without gangrene E11.42 Type 2 diabetes mellitus with diabetic polyneuropathy Physician Procedures CPT4 Code: [id number] Description: [id number] - WC PHYS LEVEL 3 - EST PT Modifier: Quantity: 1 CPT4 Code: Description: ICD-10 Diagnosis Description E11.621 Type 2 diabetes mellitus with foot ulcer L97.528 Non-pressure chronic ulcer of other part of left foot with other specifie L97.412 Non-pressure chronic ulcer of right heel and midfoot with fat layer expos  E11.51 Type 2 diabetes mellitus with diabetic peripheral angiopathy without gang Modifier: d severity ed rene Quantity: Electronic Signature(s) Signed: 11/11/2021 11:30:28 AM By: Worthy Keeler PA-C Entered By: Worthy Keeler on 11/11/2021 11:30:28

## 2021-11-12 DIAGNOSIS — E1140 Type 2 diabetes mellitus with diabetic neuropathy, unspecified: Secondary | ICD-10-CM | POA: Diagnosis not present

## 2021-11-12 DIAGNOSIS — N183 Chronic kidney disease, stage 3 unspecified: Secondary | ICD-10-CM | POA: Diagnosis not present

## 2021-11-12 DIAGNOSIS — I6523 Occlusion and stenosis of bilateral carotid arteries: Secondary | ICD-10-CM | POA: Diagnosis not present

## 2021-11-12 DIAGNOSIS — E782 Mixed hyperlipidemia: Secondary | ICD-10-CM | POA: Diagnosis not present

## 2021-11-12 DIAGNOSIS — E6609 Other obesity due to excess calories: Secondary | ICD-10-CM | POA: Diagnosis not present

## 2021-11-12 DIAGNOSIS — E0859 Diabetes mellitus due to underlying condition with other circulatory complications: Secondary | ICD-10-CM | POA: Diagnosis not present

## 2021-11-12 DIAGNOSIS — G463 Brain stem stroke syndrome: Secondary | ICD-10-CM | POA: Diagnosis not present

## 2021-11-12 DIAGNOSIS — I2510 Atherosclerotic heart disease of native coronary artery without angina pectoris: Secondary | ICD-10-CM | POA: Diagnosis not present

## 2021-11-12 DIAGNOSIS — I25799 Atherosclerosis of other coronary artery bypass graft(s) with unspecified angina pectoris: Secondary | ICD-10-CM | POA: Diagnosis not present

## 2021-11-12 DIAGNOSIS — E1121 Type 2 diabetes mellitus with diabetic nephropathy: Secondary | ICD-10-CM | POA: Diagnosis not present

## 2021-11-12 DIAGNOSIS — F17200 Nicotine dependence, unspecified, uncomplicated: Secondary | ICD-10-CM | POA: Diagnosis not present

## 2021-11-12 DIAGNOSIS — E11621 Type 2 diabetes mellitus with foot ulcer: Secondary | ICD-10-CM | POA: Diagnosis not present

## 2021-11-14 DIAGNOSIS — G463 Brain stem stroke syndrome: Secondary | ICD-10-CM | POA: Diagnosis not present

## 2021-11-14 DIAGNOSIS — E1140 Type 2 diabetes mellitus with diabetic neuropathy, unspecified: Secondary | ICD-10-CM | POA: Diagnosis not present

## 2021-11-14 DIAGNOSIS — E1121 Type 2 diabetes mellitus with diabetic nephropathy: Secondary | ICD-10-CM | POA: Diagnosis not present

## 2021-11-18 ENCOUNTER — Encounter: Payer: Medicare Other | Attending: Physician Assistant | Admitting: Physician Assistant

## 2021-11-18 DIAGNOSIS — L97528 Non-pressure chronic ulcer of other part of left foot with other specified severity: Secondary | ICD-10-CM | POA: Insufficient documentation

## 2021-11-18 DIAGNOSIS — E11621 Type 2 diabetes mellitus with foot ulcer: Secondary | ICD-10-CM | POA: Diagnosis not present

## 2021-11-18 DIAGNOSIS — E1142 Type 2 diabetes mellitus with diabetic polyneuropathy: Secondary | ICD-10-CM | POA: Diagnosis not present

## 2021-11-18 DIAGNOSIS — I10 Essential (primary) hypertension: Secondary | ICD-10-CM | POA: Diagnosis not present

## 2021-11-18 DIAGNOSIS — L97412 Non-pressure chronic ulcer of right heel and midfoot with fat layer exposed: Secondary | ICD-10-CM | POA: Diagnosis not present

## 2021-11-18 DIAGNOSIS — E1151 Type 2 diabetes mellitus with diabetic peripheral angiopathy without gangrene: Secondary | ICD-10-CM | POA: Insufficient documentation

## 2021-11-18 NOTE — Progress Notes (Addendum)
ROSANGELICA, PEVEHOUSE ([id number]) Visit Report for 11/18/2021 Chief Complaint Document Details Patient Name: Morgan Daniels, Morgan Daniels Date of Service: 11/18/2021 11:15 AM Medical Record Number: [id number] Patient Account Number: [id number] Date of Birth/Sex: 05-13-1960 (61 y.o. F) Treating RN: Cornell Barman Primary Care Provider: Jonetta Osgood Other Clinician: Massie Kluver Referring Provider: Jonetta Osgood Treating Provider/Extender: Skipper Cliche in Treatment: 9 Information Obtained from: Patient Chief Complaint 09/11/2022; patient comes in for review of an area on the tip of her left heel Electronic Signature(s) Signed: 11/18/2021 11:15:22 AM By: Worthy Keeler PA-C Entered By: Worthy Keeler on 11/18/2021 11:15:21 Morgan Daniels ([id number]) -------------------------------------------------------------------------------- HPI Details Patient Name: Morgan Daniels Date of Service: 11/18/2021 11:15 AM Medical Record Number: [id number] Patient Account Number: [id number] Date of Birth/Sex: 03/23/1960 (61 y.o. F) Treating RN: Cornell Barman Primary Care Provider: Jonetta Osgood Other Clinician: Massie Kluver Referring Provider: Jonetta Osgood Treating Provider/Extender: Skipper Cliche in Treatment: 9 History of Present Illness HPI Description: ADMISSION 09/10/2021 This is a 61 year old woman who arrived in clinic accompanied by her husband. She is here for review of a area on the tip of her left heel which has been present by her description for about 3 months. I did not see any description of this in care everywhere. She is a type II diabetic with peripheral arterial disease and peripheral neuropathy. In fact she has had 2 procedures by Dr. Lucky Cowboy of vein and vascular 1 on 01/03/2021 at which time she had a angioplasty of the left anterior tibial artery and a mechanical thrombectomy of the left SFA and proximal popliteal. She underwent that she then underwent an angioplasty of the  left SFA and pop and proximal popliteal and a stent in the left SFA. On 02/28/2021 she again went underwent a mechanical thrombectomy of the left SFA and popliteal arteries and a stent placement in the left SFA and proximal popliteal as well as reangioplasty of the left anterior tibial. The patient states that the area on the left heel started about 3 months ago it is scabbed over and she cannot get this to heal. She is using a foot cream that was given to her by podiatry [foot miracle]. She also has an area on the right heel medially almost mirror image to the left although there is no open wound here the skin in the area is dry and scaly. There is no evidence of infection Past medical history includes type 2 diabetes with PAD and peripheral neuropathy, coronary artery disease status post CABG x4, depression, hypertension, hypercholesterolemia, irritable bowel, carotid artery stenosis 09-24-2021 upon evaluation today patient appears to be doing well currently in regard to her wound as far as infection is concerned there is no signs of active infection at this time which is great news. With that being said she is still having significant pain here. Unfortunately I think this is more neuropathic in nature and is not to be a whole lot to do for this except for to get the wound healed so that there is no source of irritation and inflammation. With that being said I do believe that we need to perform debridement to do this will probably need to numb her. 10-01-2021 upon evaluation today patient appears to be doing well currently in regard to her heel ulcer. This is actually significantly improved compared to last week's visit. I am very pleased with where things stand and I do not think there is any evidence of active infection at this time which is  great news. No fevers, chills, nausea, vomiting, or diarrhea. 10-15-2021 upon evaluation today patient appears to be doing well currently in regard to her heel  ulcer. She has been tolerating the dressing changes without complication. Fortunately there does not appear to be any evidence of active infection at this time which is great news. No fevers, chills, nausea, vomiting, or diarrhea. 10-29-2021 upon evaluation today patient appears to be doing well currently in regard to her wound on the left heel this is very dry but actually appears to be healing quite nicely. Fortunately I do not see any signs of active infection at this time which is great news. No fevers, chills, nausea, vomiting, or diarrhea. 11-11-2021 upon evaluation today patient actually appears to be doing significantly better in regards to her heel ulcers. The one on the left foot is completely closed wound the right foot appears to be still has a pinpoint opening. 11-18-2021 upon evaluation today patient appears to be doing well currently in regard to her wounds on the heels. In fact everything appears to be completely healed which is great news. Fortunately I see no signs of active infection locally or systemically at this time. Electronic Signature(s) Signed: 11/18/2021 11:42:07 AM By: Worthy Keeler PA-C Entered By: Worthy Keeler on 11/18/2021 11:42:07 Morgan Daniels, Morgan Daniels ([id number]) -------------------------------------------------------------------------------- Physical Exam Details Patient Name: Morgan Daniels Date of Service: 11/18/2021 11:15 AM Medical Record Number: [id number] Patient Account Number: [id number] Date of Birth/Sex: 1960-03-06 (61 y.o. F) Treating RN: Cornell Barman Primary Care Provider: Jonetta Osgood Other Clinician: Massie Kluver Referring Provider: Jonetta Osgood Treating Provider/Extender: Skipper Cliche in Treatment: 54 Constitutional Well-nourished and well-hydrated in no acute distress. Respiratory normal breathing without difficulty. Psychiatric this patient is able to make decisions and demonstrates good insight into disease process. Alert  and Oriented x 3. pleasant and cooperative. Notes Upon inspection patient's wounds again are showing signs of being completely closed I do not see anything that appears to be open at all and in general I think that we are definitely headed in the right direction. Electronic Signature(s) Signed: 11/18/2021 11:42:27 AM By: Worthy Keeler PA-C Entered By: Worthy Keeler on 11/18/2021 11:42:27 Morgan Daniels, Morgan Daniels ([id number]) -------------------------------------------------------------------------------- Physician Orders Details Patient Name: Morgan Daniels Date of Service: 11/18/2021 11:15 AM Medical Record Number: [id number] Patient Account Number: [id number] Date of Birth/Sex: [birth year] (61 y.o. 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 please call if any further issues arise Additional Orders / Instructions o Other: - lotion feet/heels twice daily wear soft shoes Electronic Signature(s) Signed: 11/18/2021 5:04:14 PM By: Massie Kluver Signed: 11/18/2021 5:13:16 PM By: Worthy Keeler PA-C Entered By: Massie Kluver on 11/18/2021 11:36:23 Morgan Daniels ([id number]) -------------------------------------------------------------------------------- Problem List Details Patient Name:  Morgan Daniels Date of Service: 11/18/2021 11:15 AM Medical Record Number: [id number] Patient Account Number: [id number] Date of Birth/Sex: Oct 02, 1960 (61 y.o. F) Treating RN: Cornell Barman  Primary Care Provider: Jonetta Osgood Other Clinician: Massie Kluver Referring Provider: Jonetta Osgood Treating Provider/Extender: Skipper Cliche in Treatment: 9 Active Problems ICD-10 Encounter Code Description Active Date MDM Diagnosis E11.621 Type 2 diabetes mellitus with foot ulcer 09/10/2021 No Yes L97.528 Non-pressure chronic ulcer of other part of left foot with other specified 09/10/2021 No Yes severity L97.412 Non-pressure chronic ulcer of right heel and midfoot with fat layer 10/29/2021 No Yes exposed E11.51 Type 2 diabetes mellitus with diabetic peripheral angiopathy without 09/10/2021 No Yes gangrene E11.42 Type 2 diabetes mellitus with diabetic polyneuropathy 09/10/2021 No Yes Inactive Problems Resolved Problems Electronic Signature(s) Signed: 11/18/2021 11:15:19 AM By: Worthy Keeler PA-C Entered By: Worthy Keeler on 11/18/2021 11:15:19 Morgan Daniels ([id number]) -------------------------------------------------------------------------------- Progress Note Details Patient Name: Morgan Daniels Date of Service: 11/18/2021 11:15 AM Medical Record Number: [id number] Patient Account Number: [id number] Date of Birth/Sex: 02-Jun-1960 (61 y.o. F) Treating RN: Cornell Barman Primary Care Provider: Jonetta Osgood Other Clinician: Massie Kluver Referring Provider: Jonetta Osgood Treating Provider/Extender: Skipper Cliche in Treatment: 9 Subjective Chief Complaint Information obtained from Patient 09/11/2022; patient comes in for review of an area on the tip of her left heel History of Present Illness (HPI) ADMISSION 09/10/2021 This is a 61 year old woman who arrived in clinic accompanied by her husband. She is here for review of a area on the tip of her left heel  which has been present by her description for about 3 months. I did not see any description of this in care everywhere. She is a type II diabetic with peripheral arterial disease and peripheral neuropathy. In fact she has had 2 procedures by Dr. Lucky Cowboy of vein and vascular 1 on 01/03/2021 at which time she had a angioplasty of the left anterior tibial artery and a mechanical thrombectomy of the left SFA and proximal popliteal. She underwent that she then underwent an angioplasty of the left SFA and pop and proximal popliteal and a stent in the left SFA. On 02/28/2021 she again went underwent a mechanical thrombectomy of the left SFA and popliteal arteries and a stent placement in the left SFA and proximal popliteal as well as reangioplasty of the left anterior tibial. The patient states that the area on the left heel started about 3 months ago it is scabbed over and she cannot get this to heal. She is using a foot cream that was given to her by podiatry [foot miracle]. She also has an area on the right heel medially almost mirror image to the left although there is no open wound here the skin in the area is dry and scaly. There is no evidence of infection Past medical history includes type 2 diabetes with PAD and peripheral neuropathy, coronary artery disease status post CABG x4, depression, hypertension, hypercholesterolemia, irritable bowel, carotid artery stenosis 09-24-2021 upon evaluation today patient appears to be doing well currently in regard to her wound as far as infection is concerned there is no signs of active infection at this time which is great news. With that being said she is still having significant pain here. Unfortunately I think this is more neuropathic in nature and is not to be a whole lot to do for this except for to get the wound healed so that there is no source of irritation and inflammation. With that being said I do believe that we need to perform debridement to do this will  probably need to numb her. 10-01-2021 upon evaluation today patient appears to be doing  well currently in regard to her heel ulcer. This is actually significantly improved compared to last week's visit. I am very pleased with where things stand and I do not think there is any evidence of active infection at this time which is great news. No fevers, chills, nausea, vomiting, or diarrhea. 10-15-2021 upon evaluation today patient appears to be doing well currently in regard to her heel ulcer. She has been tolerating the dressing changes without complication. Fortunately there does not appear to be any evidence of active infection at this time which is great news. No fevers, chills, nausea, vomiting, or diarrhea. 10-29-2021 upon evaluation today patient appears to be doing well currently in regard to her wound on the left heel this is very dry but actually appears to be healing quite nicely. Fortunately I do not see any signs of active infection at this time which is great news. No fevers, chills, nausea, vomiting, or diarrhea. 11-11-2021 upon evaluation today patient actually appears to be doing significantly better in regards to her heel ulcers. The one on the left foot is completely closed wound the right foot appears to be still has a pinpoint opening. 11-18-2021 upon evaluation today patient appears to be doing well currently in regard to her wounds on the heels. In fact everything appears to be completely healed which is great news. Fortunately I see no signs of active infection locally or systemically at this time. Objective Constitutional Well-nourished and well-hydrated in no acute distress. Vitals Time Taken: 11:20 AM, Height: 67 in, Weight: 200 lbs, BMI: 31.3, Temperature: 98.4 F, Pulse: 77 bpm, Respiratory Rate: 18 breaths/min, Blood Pressure: 180/99 mmHg. Morgan Daniels, Morgan Daniels ([id number]) Respiratory normal breathing without difficulty. Psychiatric this patient is able to make decisions and  demonstrates good insight into disease process. Alert and Oriented x 3. pleasant and cooperative. General Notes: Upon inspection patient's wounds again are showing signs of being completely closed I do not see anything that appears to be open at all and in general I think that we are definitely headed in the right direction. Integumentary (Hair, Skin) Wound #2 status is Healed - Epithelialized. Original cause of wound was Gradually Appeared. The date acquired was: 10/22/2021. The wound has been in treatment 2 weeks. The wound is located on the Right,Plantar Calcaneus. The wound measures 0cm length x 0cm width x 0cm depth; 0cm^2 area and 0cm^3 volume. There is Fat Layer (Subcutaneous Tissue) exposed. There is a small amount of serous drainage noted. The wound margin is flat and intact. There is large (67-100%) granulation within the wound bed. There is no necrotic tissue within the wound bed. 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 please call if any further issues arise Additional Orders / Instructions: Other: - lotion feet/heels twice daily wear soft shoes 1. I am going to suggest that we go ahead and continue with the recommendation for wound care measures as before and the patient is in agreement with the plan. This includes the use of the appropriate offloading the biggest thing is she does not need to wear flip-flops which we have discussed she is much more comfortable in her current shoes which  I think are doing awesome for her. 2. I am also can recommend that we  have the patient continue to monitor for any signs of worsening or infection if anything changes she should contact the office and let me know as quickly as possible. We will see her back for follow-up visit as needed. Electronic Signature(s) Signed: 11/18/2021 11:43:54 AM By: Worthy Keeler PA-C Entered By: Worthy Keeler on 11/18/2021 11:43:54 Morgan Daniels ([id number]) -------------------------------------------------------------------------------- SuperBill Details Patient Name: Morgan Daniels Date of Service: 11/18/2021 Medical Record Number: [id number] Patient Account Number: [id number] Date of Birth/Sex: [birth year] (61 y.o. 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 E11.51 Type 2 diabetes mellitus with diabetic peripheral angiopathy without gangrene E11.42 Type 2 diabetes mellitus with diabetic polyneuropathy Facility Procedures CPT4 Code: [id number] Description: [phone] - WOUND CARE VISIT-LEV 2 EST PT Modifier: Quantity: 1 Physician Procedures CPT4 Code: [id number] Description: 99213 - WC PHYS LEVEL 3 - EST PT Modifier: Quantity: 1 CPT4 Code: Description: ICD-10 Diagnosis Description E11.621 Type 2 diabetes mellitus with foot ulcer L97.528 Non-pressure chronic ulcer of other part of left foot with other specifie L97.412 Non-pressure chronic ulcer of right heel and midfoot with fat layer expos  E11.51 Type 2 diabetes mellitus with diabetic peripheral angiopathy without gang Modifier: d severity ed rene Quantity: Electronic Signature(s) Signed:  11/18/2021 11:44:09 AM By: Worthy Keeler PA-C Entered By: Worthy Keeler on 11/18/2021 11:44:09

## 2021-11-18 NOTE — Progress Notes (Addendum)
Morgan Daniels ([id number]) Visit Report for 11/18/2021 Arrival Information Details Patient Name: Morgan Daniels, Morgan Daniels Date of Service: 11/18/2021 11:15 AM Medical Record Number: [id number] Patient Account Number: [id number] Date of Birth/Sex: [birth year] (61 y.o. F) Treating RN: Morgan Daniels Primary Care Morgan Daniels: Morgan Daniels Other Clinician: Massie Daniels Referring Morgan Daniels: Morgan Daniels Treating Morgan Daniels/Extender: Morgan Daniels in Treatment: 9 Visit Information History Since Last Visit All ordered tests and consults were completed: No Patient Arrived: Ambulatory Added or deleted any medications: No Arrival Time: 11:13 Any new allergies or adverse reactions: No Transfer Assistance: None Had a fall or experienced change in No Patient Requires Transmission-Based No activities of daily living that may affect Precautions: risk of falls: Patient Has Alerts: Yes Hospitalized since last visit: No Patient Alerts: Patient on Blood Pain Present Now: No Thinner Type II Diabetic Aspirin '81mg'$  [facility] 04/01/20 ABI R 1.02 L 1.04 TBI R 0.89 L 0.87 Electronic Signature(s) Signed: 11/18/2021 5:04:14 PM By: Morgan Daniels Entered By: Morgan Daniels on 11/18/2021 11:19:47 Morgan Daniels ([id number]) -------------------------------------------------------------------------------- Clinic Level of Care Assessment Details Patient Name: Morgan Daniels Date of Service: 11/18/2021 11:15 AM Medical Record Number: [id number] Patient Account Number: [id number] Date of Birth/Sex: 12-12-1960 (61 y.o. F) Treating RN: Morgan Daniels Primary Care Morgan Daniels: Morgan Daniels Other Clinician: Massie Daniels Referring Morgan Daniels: Morgan Daniels Treating Morgan Daniels/Extender: Morgan Daniels in Treatment: 9 Clinic Level of Care Assessment Items TOOL 4 Quantity Score '[]'$  - Use when only an EandM is performed on FOLLOW-UP visit 0 ASSESSMENTS - Nursing Assessment / Reassessment X - Reassessment of  Co-morbidities (includes updates in patient status) 1 10 X- 1 5 Reassessment of Adherence to Treatment Plan ASSESSMENTS - Wound and Skin Assessment / Reassessment X - Simple Wound Assessment / Reassessment - one wound 1 5 '[]'$  - 0 Complex Wound Assessment / Reassessment - multiple wounds '[]'$  - 0 Dermatologic / Skin Assessment (not related to wound area) ASSESSMENTS - Focused Assessment '[]'$  - Circumferential Edema Measurements - multi extremities 0 '[]'$  - 0 Nutritional Assessment / Counseling / Intervention '[]'$  - 0 Lower Extremity Assessment (monofilament, tuning fork, pulses) '[]'$  - 0 Peripheral Arterial Disease Assessment (using hand held doppler) ASSESSMENTS - Ostomy and/or Continence Assessment and Care '[]'$  - Incontinence Assessment and Management 0 '[]'$  - 0 Ostomy Care Assessment and Management (repouching, etc.) PROCESS - Coordination of Care X - Simple Patient / Family Education for ongoing care 1 15 '[]'$  - 0 Complex (extensive) Patient / Family Education for ongoing care '[]'$  - 0 Staff obtains Programmer, systems, Records, Test Results / Process Orders '[]'$  - 0 Staff telephones HHA, Nursing Homes / Clarify orders / etc '[]'$  - 0 Routine Transfer to another Facility (non-emergent condition) '[]'$  - 0 Routine Hospital Admission (non-emergent condition) '[]'$  - 0 New Admissions / Biomedical engineer / Ordering NPWT, Apligraf, etc. '[]'$  - 0 Emergency Hospital Admission (emergent condition) X- 1 10 Simple Discharge Coordination '[]'$  - 0 Complex (extensive) Discharge Coordination PROCESS - Special Needs '[]'$  - Pediatric / Minor Patient Management 0 '[]'$  - 0 Isolation Patient Management '[]'$  - 0 Hearing / Language / Visual special needs '[]'$  - 0 Assessment of Community assistance (transportation, D/C planning, etc.) '[]'$  - 0 Additional assistance / Altered mentation '[]'$  - 0 Support Surface(s) Assessment (bed, cushion, seat, etc.) INTERVENTIONS - Wound Cleansing / Measurement [name], [name] ([id number]) X- 1  5 Simple Wound Cleansing - one wound '[]'$  - 0 Complex Wound Cleansing - multiple wounds X- 1 5 Wound Imaging (photographs - any number of wounds) '[]'$  -  0 Wound Tracing (instead of photographs) X- 1 5 Simple Wound Measurement - one wound '[]'$  - 0 Complex Wound Measurement - multiple wounds INTERVENTIONS - Wound Dressings '[]'$  - Small Wound Dressing one or multiple wounds 0 '[]'$  - 0 Medium Wound Dressing one or multiple wounds '[]'$  - 0 Large Wound Dressing one or multiple wounds '[]'$  - 0 Application of Medications - topical '[]'$  - 0 Application of Medications - injection INTERVENTIONS - Miscellaneous '[]'$  - External ear exam 0 '[]'$  - 0 Specimen Collection (cultures, biopsies, blood, body fluids, etc.) '[]'$  - 0 Specimen(s) / Culture(s) sent or taken to Lab for analysis '[]'$  - 0 Patient Transfer (multiple staff / Civil Service fast streamer / Similar devices) '[]'$  - 0 Simple Staple / Suture removal (25 or less) '[]'$  - 0 Complex Staple / Suture removal (26 or more) '[]'$  - 0 Hypo / Hyperglycemic Management (close monitor of Blood Glucose) '[]'$  - 0 Ankle / Brachial Index (ABI) - do not check if billed separately X- 1 5 Vital Signs Has the patient been seen at the hospital within the last three years: Yes Total Score: 65 Level Of Care: New/Established - Level 2 Electronic Signature(s) Signed: 11/18/2021 5:04:14 PM By: Morgan Daniels Entered By: Morgan Daniels on 11/18/2021 11:37:12 Morgan Daniels ([id number]) -------------------------------------------------------------------------------- Encounter Discharge Information Details Patient Name: Morgan Daniels Date of Service: 11/18/2021 11:15 AM Medical Record Number: [id number] Patient Account Number: [id number] Date of Birth/Sex: Jun 22, 1960 (61 y.o. F) Treating RN: Morgan Daniels Primary Care Morgan Daniels: Morgan Daniels Other Clinician: Massie Daniels Referring Morgan Daniels: Morgan Daniels Treating Morgan Daniels/Extender: Morgan Daniels in Treatment: 9 Encounter  Discharge Information Items Discharge Condition: Stable Ambulatory Status: Ambulatory Discharge Destination: Home Transportation: Private Auto Accompanied By: self Schedule Follow-up Appointment: Yes Clinical Summary of Care: Electronic Signature(s) Signed: 11/18/2021 5:04:14 PM By: Morgan Daniels Entered By: Morgan Daniels on 11/18/2021 11:38:21 Morgan Daniels ([id number]) -------------------------------------------------------------------------------- Lower Extremity Assessment Details Patient Name: Morgan Daniels Date of Service: 11/18/2021 11:15 AM Medical Record Number: [id number] Patient Account Number: [id number] Date of Birth/Sex: 03-08-1960 (61 y.o. F) Treating RN: Morgan Daniels Primary Care Vaani Morren: Morgan Daniels Other Clinician: Massie Daniels Referring Kataleyah Carducci: Morgan Daniels Treating Siddarth Hsiung/Extender: Morgan Daniels in Treatment: 9 Electronic Signature(s) Signed: 11/18/2021 1:10:05 PM By: Gretta Cool BSN, RN, CWS, Kim RN, BSN Signed: 11/18/2021 5:04:14 PM By: Morgan Daniels Entered By: Morgan Daniels on 11/18/2021 11:26:39 Morgan Daniels ([id number]) -------------------------------------------------------------------------------- Multi Wound Chart Details Patient Name: Morgan Daniels Date of Service: 11/18/2021 11:15 AM Medical Record Number: [id number] Patient Account Number: [id number] Date of Birth/Sex: March 19, 1960 (61 y.o. F) Treating RN: Morgan Daniels Primary Care Ardice Boyan: Morgan Daniels Other Clinician: Massie Daniels Referring Everette Dimauro: Morgan Daniels Treating Roshawn Ayala/Extender: Morgan Daniels in Treatment: 9 Vital Signs Height(in): 71 Pulse(bpm): 77 Weight(lbs): 200 Blood Pressure(mmHg): 180/99 Body Mass Index(BMI): 31.3 Temperature(F): 98.4 Respiratory Rate(breaths/min): 18 Photos: [N/A:N/A] Wound Location: Right, Plantar Calcaneus N/A N/A Wounding Event: Gradually Appeared N/A N/A Primary Etiology: Diabetic Wound/Ulcer of the Lower  N/A N/A Extremity Comorbid History: Coronary Artery Disease, N/A N/A Hypertension, Myocardial Infarction, Type II Diabetes, Neuropathy Date Acquired: 10/22/2021 N/A N/A Weeks of Treatment: 2 N/A N/A Wound Status: Healed - Epithelialized N/A N/A Wound Recurrence: No N/A N/A Measurements L x W x D (cm) 0x0x0 N/A N/A Area (cm) : 0 N/A N/A Volume (cm) : 0 N/A N/A % Reduction in Area: 100.00% N/A N/A % Reduction in Volume: 100.00% N/A N/A Classification: Grade 1 N/A N/A Exudate Amount: Small N/A N/A Exudate Type: Serous N/A N/A  Exudate Color: amber N/A N/A Wound Margin: Flat and Intact N/A N/A Granulation Amount: Large (67-100%) N/A N/A Necrotic Amount: None Present (0%) N/A N/A Exposed Structures: Fat Layer (Subcutaneous Tissue): N/A N/A Yes Epithelialization: Large (67-100%) N/A N/A Treatment Notes Electronic Signature(s) Signed: 11/18/2021 5:04:14 PM By: Morgan Daniels Entered By: Morgan Daniels on 11/18/2021 11:35:02 Morgan Daniels ([id number]) -------------------------------------------------------------------------------- [place] Details Patient Name: Morgan Daniels Date of Service: 11/18/2021 11:15 AM Medical Record Number: [id number] Patient Account Number: [id number] Date of Birth/Sex: [birth year] (61 y.o. F) Treating RN: Morgan Daniels Primary Care Elvis Laufer: Morgan Daniels Other Clinician: Massie Daniels Referring Kimi Kroft: Morgan Daniels Treating Naftali Carchi/Extender: Morgan Daniels in Treatment: 9 Active Inactive Electronic Signature(s) Signed: 11/18/2021 1:10:05 PM By: Gretta Cool, BSN, RN, CWS, Kim RN, BSN Signed: 11/18/2021 5:04:14 PM By: Morgan Daniels Entered By: Morgan Daniels on 11/18/2021 11:34:53 Morgan Daniels ([id number]) -------------------------------------------------------------------------------- Pain Assessment Details Patient Name: Morgan Daniels Date of Service: 11/18/2021 11:15 AM Medical Record Number: [id number] Patient  Account Number: [id number] Date of Birth/Sex: 10-02-1960 (61 y.o. F) Treating RN: Morgan Daniels Primary Care Odean Mcelwain: Morgan Daniels Other Clinician: Massie Daniels Referring Rukia Mcgillivray: Morgan Daniels Treating Burlin Mcnair/Extender: Morgan Daniels in Treatment: 9 Active Problems Location of Pain Severity and Description of Pain Patient Has Paino No Site Locations Pain Management and Medication Current Pain Management: Electronic Signature(s) Signed: 11/18/2021 1:10:05 PM By: Gretta Cool, BSN, RN, CWS, Kim RN, BSN Signed: 11/18/2021 5:04:14 PM By: Morgan Daniels Entered By: Morgan Daniels on 11/18/2021 11:22:35 Morgan Daniels ([id number]) -------------------------------------------------------------------------------- Patient/Caregiver Education Details Patient Name: Morgan Daniels Date of Service: 11/18/2021 11:15 AM Medical Record Number: [id number] Patient Account Number: [id number] Date of Birth/Gender: 1960/09/23 (61 y.o. F) Treating RN: Morgan Daniels Primary Care Physician: Morgan Daniels Other Clinician: Massie Daniels Referring Physician: Jonetta Daniels Treating Physician/Extender: Morgan Daniels in Treatment: 9 Education Assessment Education Provided To: Patient Education Topics Provided Wound/Skin Impairment: Handouts: Other: wound healed. Please call if any further issues arise Methods: Explain/Verbal Responses: State content correctly Electronic Signature(s) Signed: 11/18/2021 5:04:14 PM By: Morgan Daniels Entered By: Morgan Daniels on 11/18/2021 11:37:49 Morgan Daniels ([id number]) -------------------------------------------------------------------------------- Wound Assessment Details Patient Name: Morgan Daniels Date of Service: 11/18/2021 11:15 AM Medical Record Number: [id number] Patient Account Number: [id number] Date of Birth/Sex: May 08, 1960 (61 y.o. F) Treating RN: Morgan Daniels Primary Care Kaevion Sinclair: Morgan Daniels Other Clinician: Massie Daniels Referring Araminta Zorn: Morgan Daniels Treating Valley Ke/Extender: Morgan Daniels in Treatment: 9 Wound Status Wound Number: 2 Primary Diabetic Wound/Ulcer of the Lower Extremity Etiology: Wound Location: Right, Plantar Calcaneus Wound Healed - Epithelialized Wounding Event: Gradually Appeared Status: Date Acquired: 10/22/2021 Comorbid Coronary Artery Disease, Hypertension, Myocardial Weeks Of Treatment: 2 History: Infarction, Type II Diabetes, Neuropathy Clustered Wound: No Photos Wound Measurements Length: (cm) 0 % R Width: (cm) 0 % R Depth: (cm) 0 Epi Area: (cm) 0 Volume: (cm) 0 eduction in Area: 100% eduction in Volume: 100% thelialization: Large (67-100%) Wound Description Classification: Grade 1 Fou Wound Margin: Flat and Intact Slo Exudate Amount: Small Exudate Type: Serous Exudate Color: amber l Odor After Cleansing: No ugh/Fibrino No Wound Bed Granulation Amount: Large (67-100%) Exposed Structure Necrotic Amount: None Present (0%) Fat Layer (Subcutaneous Tissue) Exposed: Yes Treatment Notes Wound #2 (Calcaneus) Wound Laterality: Plantar, Right Cleanser Peri-Wound Care Topical Primary Dressing Secondary Dressing Secured With NIKESHIA, KEETCH ([id number]) Compression Wrap Compression Stockings Add-Ons Electronic Signature(s) Signed: 11/18/2021 1:10:05 PM By: Gretta Cool, BSN, RN, CWS, Kim RN, BSN Signed: 11/18/2021 5:04:14 PM By: Morgan Daniels  Entered By: Morgan Daniels on 11/18/2021 11:34:29 Morgan Daniels ([id number]) -------------------------------------------------------------------------------- Vitals Details Patient Name: Morgan Daniels Date of Service: 11/18/2021 11:15 AM Medical Record Number: [id number] Patient Account Number: [id number] Date of Birth/Sex: 03-21-1960 (61 y.o. F) Treating RN: Morgan Daniels Primary Care Ersel Enslin: Morgan Daniels Other Clinician: Massie Daniels Referring Nahia Nissan: Morgan Daniels Treating  Odie Rauen/Extender: Morgan Daniels in Treatment: 9 Vital Signs Time Taken: 11:20 Temperature (F): 98.4 Height (in): 67 Pulse (bpm): 77 Weight (lbs): 200 Respiratory Rate (breaths/min): 18 Body Mass Index (BMI): 31.3 Blood Pressure (mmHg): 180/99 Reference Range: 80 - 120 mg / dl Electronic Signature(s) Signed: 11/18/2021 5:04:14 PM By: Morgan Daniels Entered By: Morgan Daniels on 11/18/2021 11:22:32

## 2021-12-02 DIAGNOSIS — E0859 Diabetes mellitus due to underlying condition with other circulatory complications: Secondary | ICD-10-CM | POA: Diagnosis not present

## 2021-12-02 DIAGNOSIS — E782 Mixed hyperlipidemia: Secondary | ICD-10-CM | POA: Diagnosis not present

## 2021-12-02 DIAGNOSIS — I25799 Atherosclerosis of other coronary artery bypass graft(s) with unspecified angina pectoris: Secondary | ICD-10-CM | POA: Diagnosis not present

## 2021-12-02 DIAGNOSIS — E1121 Type 2 diabetes mellitus with diabetic nephropathy: Secondary | ICD-10-CM | POA: Diagnosis not present

## 2021-12-02 DIAGNOSIS — E1140 Type 2 diabetes mellitus with diabetic neuropathy, unspecified: Secondary | ICD-10-CM | POA: Diagnosis not present

## 2021-12-02 DIAGNOSIS — Z6830 Body mass index (BMI) 30.0-30.9, adult: Secondary | ICD-10-CM | POA: Diagnosis not present

## 2021-12-02 DIAGNOSIS — E6609 Other obesity due to excess calories: Secondary | ICD-10-CM | POA: Diagnosis not present

## 2021-12-02 DIAGNOSIS — I2510 Atherosclerotic heart disease of native coronary artery without angina pectoris: Secondary | ICD-10-CM | POA: Diagnosis not present

## 2021-12-02 DIAGNOSIS — I6523 Occlusion and stenosis of bilateral carotid arteries: Secondary | ICD-10-CM | POA: Diagnosis not present

## 2021-12-02 DIAGNOSIS — G463 Brain stem stroke syndrome: Secondary | ICD-10-CM | POA: Diagnosis not present

## 2021-12-02 DIAGNOSIS — F17200 Nicotine dependence, unspecified, uncomplicated: Secondary | ICD-10-CM | POA: Diagnosis not present

## 2021-12-02 DIAGNOSIS — E1165 Type 2 diabetes mellitus with hyperglycemia: Secondary | ICD-10-CM | POA: Diagnosis not present

## 2021-12-08 ENCOUNTER — Other Ambulatory Visit (INDEPENDENT_AMBULATORY_CARE_PROVIDER_SITE_OTHER): Payer: Self-pay | Admitting: Vascular Surgery

## 2021-12-08 ENCOUNTER — Other Ambulatory Visit: Payer: Self-pay | Admitting: Nurse Practitioner

## 2021-12-08 DIAGNOSIS — E1140 Type 2 diabetes mellitus with diabetic neuropathy, unspecified: Secondary | ICD-10-CM

## 2021-12-08 DIAGNOSIS — K580 Irritable bowel syndrome with diarrhea: Secondary | ICD-10-CM

## 2021-12-09 ENCOUNTER — Other Ambulatory Visit: Payer: Self-pay | Admitting: Nurse Practitioner

## 2021-12-09 DIAGNOSIS — E1121 Type 2 diabetes mellitus with diabetic nephropathy: Secondary | ICD-10-CM | POA: Diagnosis not present

## 2021-12-09 DIAGNOSIS — E1140 Type 2 diabetes mellitus with diabetic neuropathy, unspecified: Secondary | ICD-10-CM | POA: Diagnosis not present

## 2021-12-09 DIAGNOSIS — M79671 Pain in right foot: Secondary | ICD-10-CM

## 2021-12-09 DIAGNOSIS — I25799 Atherosclerosis of other coronary artery bypass graft(s) with unspecified angina pectoris: Secondary | ICD-10-CM | POA: Diagnosis not present

## 2021-12-09 DIAGNOSIS — I6523 Occlusion and stenosis of bilateral carotid arteries: Secondary | ICD-10-CM | POA: Diagnosis not present

## 2021-12-09 DIAGNOSIS — Z6830 Body mass index (BMI) 30.0-30.9, adult: Secondary | ICD-10-CM | POA: Diagnosis not present

## 2021-12-09 DIAGNOSIS — G463 Brain stem stroke syndrome: Secondary | ICD-10-CM | POA: Diagnosis not present

## 2021-12-09 DIAGNOSIS — M79672 Pain in left foot: Secondary | ICD-10-CM

## 2021-12-09 DIAGNOSIS — E0859 Diabetes mellitus due to underlying condition with other circulatory complications: Secondary | ICD-10-CM | POA: Diagnosis not present

## 2021-12-09 DIAGNOSIS — E782 Mixed hyperlipidemia: Secondary | ICD-10-CM | POA: Diagnosis not present

## 2021-12-09 DIAGNOSIS — E11621 Type 2 diabetes mellitus with foot ulcer: Secondary | ICD-10-CM | POA: Diagnosis not present

## 2021-12-09 DIAGNOSIS — N183 Chronic kidney disease, stage 3 unspecified: Secondary | ICD-10-CM | POA: Diagnosis not present

## 2021-12-09 DIAGNOSIS — E6609 Other obesity due to excess calories: Secondary | ICD-10-CM | POA: Diagnosis not present

## 2021-12-09 NOTE — Telephone Encounter (Signed)
Refill sent.

## 2021-12-09 NOTE — Telephone Encounter (Signed)
Please only send until her appt 01/01/22

## 2021-12-16 ENCOUNTER — Encounter (INDEPENDENT_AMBULATORY_CARE_PROVIDER_SITE_OTHER): Payer: Self-pay

## 2021-12-17 ENCOUNTER — Ambulatory Visit: Payer: Medicare Other | Admitting: *Deleted

## 2021-12-18 ENCOUNTER — Ambulatory Visit: Payer: Medicare Other | Admitting: Nurse Practitioner

## 2021-12-25 ENCOUNTER — Telehealth: Payer: Self-pay | Admitting: Nurse Practitioner

## 2021-12-25 NOTE — Telephone Encounter (Signed)
Left vm and sent mychart message to confirm 01/01/22 appointment-Toni

## 2021-12-27 ENCOUNTER — Ambulatory Visit: Payer: Medicare Other | Admitting: *Deleted

## 2022-01-01 ENCOUNTER — Other Ambulatory Visit: Payer: Self-pay | Admitting: Nurse Practitioner

## 2022-01-01 ENCOUNTER — Ambulatory Visit: Payer: Medicare Other | Admitting: Nurse Practitioner

## 2022-01-01 DIAGNOSIS — K580 Irritable bowel syndrome with diarrhea: Secondary | ICD-10-CM

## 2022-01-15 ENCOUNTER — Ambulatory Visit (INDEPENDENT_AMBULATORY_CARE_PROVIDER_SITE_OTHER): Payer: Medicare Other | Admitting: Nurse Practitioner

## 2022-01-15 ENCOUNTER — Encounter: Payer: Self-pay | Admitting: Nurse Practitioner

## 2022-01-15 VITALS — BP 180/95 | HR 73 | Temp 97.4°F | Resp 16 | Ht 67.0 in | Wt 189.2 lb

## 2022-01-15 DIAGNOSIS — F321 Major depressive disorder, single episode, moderate: Secondary | ICD-10-CM

## 2022-01-15 DIAGNOSIS — I10 Essential (primary) hypertension: Secondary | ICD-10-CM | POA: Diagnosis not present

## 2022-01-15 DIAGNOSIS — E781 Pure hyperglyceridemia: Secondary | ICD-10-CM

## 2022-01-15 DIAGNOSIS — Z0001 Encounter for general adult medical examination with abnormal findings: Secondary | ICD-10-CM

## 2022-01-15 DIAGNOSIS — Z760 Encounter for issue of repeat prescription: Secondary | ICD-10-CM | POA: Diagnosis not present

## 2022-01-15 MED ORDER — LOSARTAN POTASSIUM 25 MG PO TABS: 25.0000 mg | ORAL_TABLET | Freq: Every day | ORAL | 3 refills | Status: DC

## 2022-01-15 MED ORDER — ROSUVASTATIN CALCIUM 40 MG PO TABS
40.0000 mg | ORAL_TABLET | Freq: Every day | ORAL | 3 refills | Status: AC
Start: 2022-01-15 — End: ?

## 2022-01-15 MED ORDER — FLUOXETINE HCL 40 MG PO CAPS: 80.0000 mg | ORAL_CAPSULE | Freq: Every day | ORAL | 0 refills | Status: DC

## 2022-01-15 MED ORDER — TIZANIDINE HCL 4 MG PO TABS
ORAL_TABLET | ORAL | 1 refills | Status: DC
Start: 2022-01-15 — End: 2022-05-19

## 2022-01-15 MED ORDER — MIRTAZAPINE 15 MG PO TABS: 15.0000 mg | ORAL_TABLET | Freq: Every day | ORAL | 2 refills | Status: DC

## 2022-01-15 MED ORDER — TRAMADOL HCL 50 MG PO TABS: ORAL_TABLET | ORAL | 2 refills | Status: DC

## 2022-01-15 MED ORDER — AMLODIPINE BESYLATE 10 MG PO TABS: 10.0000 mg | ORAL_TABLET | Freq: Every day | ORAL | 1 refills | Status: DC

## 2022-01-15 MED ORDER — PRALUENT 150 MG/ML ~~LOC~~ SOAJ: 150.0000 mg | SUBCUTANEOUS | 3 refills | Status: DC

## 2022-01-15 NOTE — Progress Notes (Signed)
[facility] [place], [place] [address]  Internal MEDICINE  Office Visit Note  Patient Name: Morgan Daniels  [id number]  [id number]  Date of Service: 01/15/2022  Chief Complaint  Patient presents with   Medicare Wellness   Depression   Diabetes   Gastroesophageal Reflux   Hypertension   Hyperlipidemia    HPI Morgan Daniels presents for an annual well visit and physical exam.  Well-appearing 61 y.o. female with hypertension, high cholesterol, diabetes, neuropathy, chronic pain, and anxiety and depression. Had open heart surgery last year.  Preventive screenings are up to date Labs: labs drawn in September via endocrinology, cholesterol levels are very high especially triglycerides and she is maxed out on rosuvastatin and is also on lopid.  New or worsening pain: chronic pain, need tramadol refills  Other concerns: worsening depression, wants to add something to fluoxetine that will help.     Current Medication: Outpatient Encounter Medications as of 01/15/2022  Medication Sig   Alirocumab (PRALUENT) 150 MG/ML SOAJ Inject 150 mg into the skin every 30 (thirty) days.   aspirin 81 MG chewable tablet Chew 1 tablet (81 mg total) by mouth daily.   Cholecalciferol (VITAMIN D-3) 125 MCG (5000 UT) TABS Take 5,000 Units by mouth daily.   collagenase (SANTYL) 250 UNIT/GM ointment Apply topically daily.   Continuous Blood Gluc Receiver (FREESTYLE LIBRE 2 READER) DEVI As directed   Continuous Blood Gluc Sensor (FREESTYLE LIBRE 2 SENSOR) MISC USE TO TEST BLOOD GLUCOSE 4 TIMES A DAY . DX E11.65   dicyclomine (BENTYL) 10 MG capsule TAKE 1 CAPSULE (10 MG TOTAL) BY MOUTH 3 (THREE) TIMES DAILY BEFORE MEALS.   FARXIGA 10 MG TABS tablet TAKE 1 TABLET BY MOUTH DAILY BEFORE BREAKFAST.   gabapentin (NEURONTIN) 600 MG tablet Take 1 tablet (600 mg total) by mouth in the morning, at noon, in the evening, and at bedtime.   gemfibrozil (LOPID) 600 MG tablet Take 1 tablet (600 mg total) by  mouth 2 (two) times daily.   insulin aspart (NOVOLOG FLEXPEN) 100 UNIT/ML FlexPen Inject 20 Units into the skin 3 (three) times daily before meals. Needs appointment for further refills. (Patient taking differently: Inject 0-25 Units into the skin 3 (three) times daily before meals. Sliding scale)   metoprolol succinate (TOPROL-XL) 50 MG 24 hr tablet Take 1 tablet (50 mg total) by mouth daily.   mirtazapine (REMERON) 15 MG tablet Take 1 tablet (15 mg total) by mouth at bedtime.   nitroGLYCERIN (NITRODUR - DOSED IN MG/24 HR) 0.2 mg/hr patch Place 1 patch (0.2 mg total) onto the skin daily.   TRESIBA FLEXTOUCH 200 UNIT/ML FlexTouch Pen INJECT 100 UNITS INTO THE SKIN AT BEDTIME.   ULTICARE MICRO PEN NEEDLES 32G X 4 MM MISC TO USE WITH INSULIN DOSING THREE TIMES DAILY PRIOR TO MEALS AND AT BEDTIME FOR BASAL INSULIN.   [DISCONTINUED] amLODipine (NORVASC) 10 MG tablet Take 1 tablet (10 mg total) by mouth daily.   [DISCONTINUED] FLUoxetine (PROZAC) 40 MG capsule TAKE 2 CAPSULES BY MOUTH DAILY   [DISCONTINUED] losartan (COZAAR) 25 MG tablet Take 1 tablet (25 mg total) by mouth daily.   [DISCONTINUED] rosuvastatin (CRESTOR) 40 MG tablet Take 1 tablet (40 mg total) by mouth daily.   [DISCONTINUED] tiZANidine (ZANAFLEX) 4 MG tablet TAKE 1 TABLET(S) BY MOUTH TWICE A DAY AS NEEDED FOR MUSCLE PAIN AND SPASMS   [DISCONTINUED] traMADol (ULTRAM) 50 MG tablet TAKE 1 TABLET BY MOUTH 3 (THREE) TIMES DAILY AS NEEDED FOR MODERATE PAIN  OR SEVERE PAIN.   amLODipine (NORVASC) 10 MG tablet Take 1 tablet (10 mg total) by mouth daily.   FLUoxetine (PROZAC) 40 MG capsule Take 2 capsules (80 mg total) by mouth daily.   losartan (COZAAR) 25 MG tablet Take 1 tablet (25 mg total) by mouth daily.   rosuvastatin (CRESTOR) 40 MG tablet Take 1 tablet (40 mg total) by mouth daily.   tiZANidine (ZANAFLEX) 4 MG tablet TAKE 1 TABLET(S) BY MOUTH TWICE A DAY AS NEEDED FOR MUSCLE PAIN AND SPASMS   traMADol (ULTRAM) 50 MG tablet TAKE 1 TABLET  BY MOUTH 3 (THREE) TIMES DAILY AS NEEDED FOR MODERATE PAIN OR SEVERE PAIN.   No facility-administered encounter medications on file as of 01/15/2022.    Surgical History: Past Surgical History:  Procedure Laterality Date   ABDOMINAL HYSTERECTOMY  1990   BACK SURGERY  2000, 2004   CAROTID ENDARTERECTOMY Left 05/06/12   CARPAL TUNNEL RELEASE Left 07/18/2015   Procedure: CARPAL TUNNEL RELEASE;  Surgeon: Earnestine Leys, MD;  Location: [facility];  Service: Orthopedics;  Laterality: Left;   CEREBRAL ANGIOGRAM Bilateral 05/03/2012   Procedure: CEREBRAL ANGIOGRAM;  Surgeon: Angelia Mould, MD;  Location: [facility] CATH LAB;  Service: Cardiovascular;  Laterality: Bilateral;   CHOLECYSTECTOMY  2001   COLONOSCOPY WITH PROPOFOL N/A 11/19/2018   Procedure: COLONOSCOPY WITH PROPOFOL;  Surgeon: Lucilla Lame, MD;  Location: [place];  Service: Endoscopy;  Laterality: N/A;  Diabetic - insulin   CORONARY ARTERY BYPASS GRAFT N/A 01/18/2021   Procedure: CORONARY ARTERY BYPASS GRAFTING (CABG) x 4  USING LEFT INTERNAL MAMMARY ARTERY AND LEFT ENDOSCOPIC GREATER SAPHENOUS VEIN CONDUITS;  Surgeon: [name], MD;  Location: [place];  Service: Open Heart Surgery;  Laterality: N/A;   CORONARY/GRAFT ACUTE MI REVASCULARIZATION N/A 01/13/2021   Procedure: Coronary/Graft Acute MI Revascularization;  Surgeon: Isaias Cowman, MD;  Location: [place] CV LAB;  Service: Cardiovascular;  Laterality: N/A;   ENDARTERECTOMY Left 05/06/2012   Procedure: ENDARTERECTOMY CAROTID;  Surgeon: Angelia Mould, MD;  Location: [place];  Service: Vascular;  Laterality: Left;   ENDARTERECTOMY Right 08/09/2013   Procedure: ENDARTERECTOMY CAROTID-RIGHT;  Surgeon: Angelia Mould, MD;  Location: [place];  Service: Vascular;  Laterality: Right;   ENDOVEIN HARVEST OF GREATER SAPHENOUS VEIN Left 01/18/2021   Procedure: ENDOVEIN HARVEST OF GREATER SAPHENOUS VEIN;  Surgeon: [name], MD;  Location: [place];   Service: Open Heart Surgery;  Laterality: Left;   HERNIA REPAIR     IABP INSERTION Right 01/13/2021   Procedure: IABP Insertion;  Surgeon: Isaias Cowman, MD;  Location: [place] [facility];  Service: Cardiovascular;  Laterality: Right;   LEFT HEART CATH AND CORONARY ANGIOGRAPHY N/A 01/13/2021   Procedure: LEFT HEART CATH AND CORONARY ANGIOGRAPHY;  Surgeon: Isaias Cowman, MD;  Location: [place] [facility] LAB;  Service: Cardiovascular;  Laterality: N/A;   LOWER EXTREMITY ANGIOGRAPHY Left 01/03/2021   Procedure: LOWER EXTREMITY ANGIOGRAPHY;  Surgeon: Algernon Huxley, MD;  Location: [place] [facility];  Service: Cardiovascular;  Laterality: Left;   LOWER EXTREMITY ANGIOGRAPHY Left 02/28/2021   Procedure: LOWER EXTREMITY ANGIOGRAPHY;  Surgeon: Algernon Huxley, MD;  Location: [place] [facility];  Service: Cardiovascular;  Laterality: Left;   PATCH ANGIOPLASTY Left 05/06/2012   Procedure: WITH DACRON PATCH ANGIOPLASTY ;  Surgeon: Angelia Mould, MD;  Location: [place];  Service: Vascular;  Laterality: Left;   POLYPECTOMY  11/19/2018   Procedure: POLYPECTOMY;  Surgeon: Lucilla Lame, MD;  Location: [facility];  Service: Endoscopy;;   SPINE SURGERY  2004   TEE WITHOUT CARDIOVERSION N/A 01/18/2021   Procedure: TRANSESOPHAGEAL ECHOCARDIOGRAM (TEE);  Surgeon: [name], MD;  Location: [place];  Service: Open Heart Surgery;  Laterality: N/A;   TONSILLECTOMY     TUBAL LIGATION      Medical History: Past Medical History:  Diagnosis Date   Anxiety    Arthritis    Bilateral carotid artery stenosis 2014   Carotid artery occlusion    Chronic kidney disease 06/18/2015   UTI   Chronic kidney disease 2017   Current smoker    CVA (cerebral vascular accident) ([place]) 2013   Depression    Diabetes ([place])    Diverticulosis    Fatty liver    Fibromyalgia    GERD (gastroesophageal reflux disease)    H/O hiatal hernia    Heart attack ([place]) 01/12/2021   Hiatal hernia     Hypercholesteremia    Hypertension    IBS (irritable bowel syndrome)    PAD (peripheral artery disease) (HCC)    Peptic ulcer    Plantar fasciitis    Stroke ([place]) 01/31/2012   Right side-ministroke   T2DM (type 2 diabetes mellitus) ([place])     Family History: Family History  Problem Relation Age of Onset   Hypertension Mother    Hyperlipidemia Mother    Deep vein thrombosis Mother    Cancer Father    Alcohol abuse Father    Heart failure Father    Hypertension Maternal Grandmother    Deep vein thrombosis Sister    Alcohol abuse Sister    Alcohol abuse Sister     Social History   Socioeconomic History   Marital status: Married    Spouse name: Not on file   Number of children: Not on file   Years of education: Not on file   Highest education level: Not on file  Occupational History   Not on file  Tobacco Use   Smoking status: Former    Packs/day: 1.00    Years: 40.00    Total pack years: 40.00    Types: Cigarettes    Start date: 11/09/1970    Passive exposure: Current   Smokeless tobacco: Never   Tobacco comments:       Vaping Use   Vaping Use: Former  Substance and Sexual Activity   Alcohol use: Not Currently    Comment: rarely   Drug use: No   Sexual activity: Not Currently    Partners: Male  Other Topics Concern   Not on file  Social History Narrative   ** Merged History Encounter **       Social Determinants of Health   Financial Resource Strain: Not on file  Food Insecurity: Not on file  Transportation Needs: Not on file  Physical Activity: Not on file  Stress: Not on file  Social Connections: Not on file  Intimate Partner Violence: Not on file      Review of Systems  Constitutional:  Negative for activity change, appetite change, chills, fatigue, fever and unexpected weight change.  HENT: Negative.  Negative for congestion, ear pain, rhinorrhea, sore throat and trouble swallowing.   Eyes: Negative.   Respiratory: Negative.  Negative for  cough, chest tightness, shortness of breath and wheezing.   Cardiovascular: Negative.  Negative for chest pain and palpitations.  Gastrointestinal:  Positive for abdominal distention (bloating, heartburn), abdominal pain, constipation and diarrhea. Negative for blood in stool, nausea and vomiting.  Endocrine: Negative.   Genitourinary: Negative.  Negative for difficulty urinating, dysuria, frequency, hematuria and urgency.  Musculoskeletal: Negative.  Negative for arthralgias, back pain, joint swelling, myalgias and neck pain.  Skin: Negative.  Negative for rash and wound.  Allergic/Immunologic: Negative.  Negative for immunocompromised state.  Neurological: Negative.  Negative for dizziness, seizures, numbness and headaches.  Hematological: Negative.   Psychiatric/Behavioral:  Negative for behavioral problems, self-injury and suicidal ideas. The patient is not nervous/anxious.     Vital Signs: BP (!) 180/95 Comment: 203/90  Pulse 73   Temp (!) 97.4 F (36.3 C)   Resp 16   Ht '5\' 7"'$  (1.702 m)   Wt 189 lb 3.2 oz (85.8 kg)   SpO2 97%   BMI 29.63 kg/m    Physical Exam Vitals reviewed.  Constitutional:      General: She is awake. She is not in acute distress.    Appearance: Normal appearance. She is well-developed and well-groomed. She is obese. She is not diaphoretic.  HENT:     Head: Normocephalic and atraumatic.     Right Ear: Tympanic membrane, ear canal and external ear normal.     Left Ear: Tympanic membrane, ear canal and external ear normal.     Nose: Nose normal. No congestion or rhinorrhea.     Mouth/Throat:     Pharynx: No oropharyngeal exudate or posterior oropharyngeal erythema.  Eyes:     General: Lids are normal. Vision grossly intact. Gaze aligned appropriately. No scleral icterus.       Right eye: No discharge.        Left eye: No discharge.     Extraocular Movements: Extraocular movements intact.     Conjunctiva/sclera: Conjunctivae normal.     Pupils: Pupils  are equal, round, and reactive to light.     Funduscopic exam:    Right eye: Red reflex present.        Left eye: Red reflex present. Neck:     Thyroid: No thyromegaly.     Vascular: No JVD.     Trachea: Trachea and phonation normal. No tracheal deviation.  Cardiovascular:     Rate and Rhythm: Normal rate and regular rhythm.     Pulses: Normal pulses.          Dorsalis pedis pulses are 2+ on the right side and 2+ on the left side.       Posterior tibial pulses are 2+ on the right side and 2+ on the left side.     Heart sounds: Normal heart sounds, S1 normal and S2 normal. No murmur heard.    No friction rub. No gallop.  Pulmonary:     Effort: Pulmonary effort is normal. No accessory muscle usage or respiratory distress.     Breath sounds: Normal breath sounds and air entry. No stridor. No wheezing or rales.  Chest:     Chest wall: No tenderness.     Comments: Declined clinical breast exam, gets annual mammograms.  Abdominal:     General: Bowel sounds are normal. There is distension.     Palpations: Abdomen is soft. There is no shifting dullness, fluid wave, mass or pulsatile mass.     Tenderness: There is generalized abdominal tenderness (related to IBS, comes and goes.). There is no guarding or rebound.  Musculoskeletal:        General: No tenderness or deformity. Normal range of motion.     Cervical back: Normal range of motion and neck supple.  Right foot: Normal range of motion.  Feet:     Right foot:     Protective Sensation: 6 sites tested.  6 sites sensed.     Skin integrity: Callus and dry skin present.     Toenail Condition: Right toenails are abnormally thick.     Left foot:     Protective Sensation: 6 sites tested.  6 sites sensed.     Skin integrity: Callus and dry skin present.     Toenail Condition: Left toenails are abnormally thick.  Lymphadenopathy:     Cervical: No cervical adenopathy.  Skin:    General: Skin is warm and dry.     Capillary Refill:  Capillary refill takes less than 2 seconds.     Coloration: Skin is not pale.     Findings: No erythema or rash.  Neurological:     Mental Status: She is alert and oriented to person, place, and time.     Cranial Nerves: No cranial nerve deficit.     Motor: No abnormal muscle tone.     Coordination: Coordination normal.     Deep Tendon Reflexes: Reflexes are normal and symmetric.  Psychiatric:        Mood and Affect: Mood normal.        Behavior: Behavior normal. Behavior is cooperative.        Thought Content: Thought content normal.        Judgment: Judgment normal.        Assessment/Plan: 1. Encounter for general adult medical examination with abnormal findings Age-appropriate preventive screenings and vaccinations discussed, annual physical exam completed. Routine labs for health maintenance deferred. PHM updated.   2. Essential hypertension Elevated, please restart losartan, refills ordered - amLODipine (NORVASC) 10 MG tablet; Take 1 tablet (10 mg total) by mouth daily.  Dispense: 90 tablet; Refill: 1  3. Familial hypertriglyceridemia Praluent prescribed will need prior auth - Alirocumab (PRALUENT) 150 MG/ML SOAJ; Inject 150 mg into the skin every 30 (thirty) days.  Dispense: 1 mL; Refill: 3  4. Medication refill - amLODipine (NORVASC) 10 MG tablet; Take 1 tablet (10 mg total) by mouth daily.  Dispense: 90 tablet; Refill: 1 - FLUoxetine (PROZAC) 40 MG capsule; Take 2 capsules (80 mg total) by mouth daily.  Dispense: 180 capsule; Refill: 0 - losartan (COZAAR) 25 MG tablet; Take 1 tablet (25 mg total) by mouth daily.  Dispense: 90 tablet; Refill: 3 - rosuvastatin (CRESTOR) 40 MG tablet; Take 1 tablet (40 mg total) by mouth daily.  Dispense: 90 tablet; Refill: 3 - tiZANidine (ZANAFLEX) 4 MG tablet; TAKE 1 TABLET(S) BY MOUTH TWICE A DAY AS NEEDED FOR MUSCLE PAIN AND SPASMS  Dispense: 180 tablet; Refill: 1 - traMADol (ULTRAM) 50 MG tablet; TAKE 1 TABLET BY MOUTH 3 (THREE) TIMES  DAILY AS NEEDED FOR MODERATE PAIN OR SEVERE PAIN.  Dispense: 90 tablet; Refill: 2  5. Depression, major, single episode, moderate (HCC) Add mirtazapine, take as prescribed, follow up in 5 weeks to reevaluate - mirtazapine (REMERON) 15 MG tablet; Take 1 tablet (15 mg total) by mouth at bedtime.  Dispense: 30 tablet; Refill: 2      General Counseling: Prim verbalizes understanding of the findings of todays visit and agrees with plan of treatment. I have discussed any further diagnostic evaluation that may be needed or ordered today. We also reviewed her medications today. she has been encouraged to call the office with any questions or concerns that should arise related to todays visit.  No orders of the defined types were placed in this encounter.   Meds ordered this encounter  Medications   mirtazapine (REMERON) 15 MG tablet    Sig: Take 1 tablet (15 mg total) by mouth at bedtime.    Dispense:  30 tablet    Refill:  2    New script, fill today thanks   amLODipine (NORVASC) 10 MG tablet    Sig: Take 1 tablet (10 mg total) by mouth daily.    Dispense:  90 tablet    Refill:  1   FLUoxetine (PROZAC) 40 MG capsule    Sig: Take 2 capsules (80 mg total) by mouth daily.    Dispense:  180 capsule    Refill:  0   Alirocumab (PRALUENT) 150 MG/ML SOAJ    Sig: Inject 150 mg into the skin every 30 (thirty) days.    Dispense:  1 mL    Refill:  3   losartan (COZAAR) 25 MG tablet    Sig: Take 1 tablet (25 mg total) by mouth daily.    Dispense:  90 tablet    Refill:  3   rosuvastatin (CRESTOR) 40 MG tablet    Sig: Take 1 tablet (40 mg total) by mouth daily.    Dispense:  90 tablet    Refill:  3   tiZANidine (ZANAFLEX) 4 MG tablet    Sig: TAKE 1 TABLET(S) BY MOUTH TWICE A DAY AS NEEDED FOR MUSCLE PAIN AND SPASMS    Dispense:  180 tablet    Refill:  1   traMADol (ULTRAM) 50 MG tablet    Sig: TAKE 1 TABLET BY MOUTH 3 (THREE) TIMES DAILY AS NEEDED FOR MODERATE PAIN OR SEVERE PAIN.     Dispense:  90 tablet    Refill:  2    Return in about 5 weeks (around 02/19/2022) for F/U, BP check, eval new med, Anias Bartol PCP.   Total time spent:30 Minutes Time spent includes review of chart, medications, test results, and follow up plan with the patient.   [place] Controlled Substance Database was reviewed by me.  This patient was seen by Jonetta Osgood, FNP-C in collaboration with Dr. Clayborn Bigness as a part of collaborative care agreement.  Quinlee Sciarra R. Valetta Fuller, MSN, FNP-C Internal medicine

## 2022-01-17 ENCOUNTER — Telehealth: Payer: Self-pay

## 2022-01-17 NOTE — Telephone Encounter (Signed)
Patient was approved for Praluent.

## 2022-01-17 NOTE — Telephone Encounter (Signed)
Sent P.A. for patient's Praluent.

## 2022-02-01 ENCOUNTER — Emergency Department: Payer: Medicare Other

## 2022-02-01 ENCOUNTER — Inpatient Hospital Stay
Admission: EM | Admit: 2022-02-01 | Discharge: 2022-02-04 | DRG: 193 | Disposition: A | Discharge status: Home Health | Payer: Medicare Other | Attending: Internal Medicine | Admitting: Internal Medicine

## 2022-02-01 ENCOUNTER — Other Ambulatory Visit: Payer: Self-pay

## 2022-02-01 DIAGNOSIS — A419 Sepsis, unspecified organism: Secondary | ICD-10-CM | POA: Diagnosis not present

## 2022-02-01 DIAGNOSIS — Z87891 Personal history of nicotine dependence: Secondary | ICD-10-CM | POA: Diagnosis not present

## 2022-02-01 DIAGNOSIS — J9601 Acute respiratory failure with hypoxia: Secondary | ICD-10-CM | POA: Diagnosis present

## 2022-02-01 DIAGNOSIS — R0902 Hypoxemia: Secondary | ICD-10-CM | POA: Diagnosis not present

## 2022-02-01 DIAGNOSIS — R4182 Altered mental status, unspecified: Secondary | ICD-10-CM | POA: Diagnosis not present

## 2022-02-01 DIAGNOSIS — J9811 Atelectasis: Secondary | ICD-10-CM | POA: Diagnosis not present

## 2022-02-01 DIAGNOSIS — Z811 Family history of alcohol abuse and dependence: Secondary | ICD-10-CM

## 2022-02-01 DIAGNOSIS — E1165 Type 2 diabetes mellitus with hyperglycemia: Secondary | ICD-10-CM | POA: Diagnosis present

## 2022-02-01 DIAGNOSIS — J189 Pneumonia, unspecified organism: Principal | ICD-10-CM | POA: Diagnosis present

## 2022-02-01 DIAGNOSIS — R739 Hyperglycemia, unspecified: Secondary | ICD-10-CM | POA: Diagnosis not present

## 2022-02-01 DIAGNOSIS — Z79899 Other long term (current) drug therapy: Secondary | ICD-10-CM

## 2022-02-01 DIAGNOSIS — Z951 Presence of aortocoronary bypass graft: Secondary | ICD-10-CM | POA: Diagnosis not present

## 2022-02-01 DIAGNOSIS — F17200 Nicotine dependence, unspecified, uncomplicated: Secondary | ICD-10-CM | POA: Diagnosis present

## 2022-02-01 DIAGNOSIS — I2510 Atherosclerotic heart disease of native coronary artery without angina pectoris: Secondary | ICD-10-CM | POA: Diagnosis present

## 2022-02-01 DIAGNOSIS — Z885 Allergy status to narcotic agent status: Secondary | ICD-10-CM

## 2022-02-01 DIAGNOSIS — E1151 Type 2 diabetes mellitus with diabetic peripheral angiopathy without gangrene: Secondary | ICD-10-CM | POA: Diagnosis present

## 2022-02-01 DIAGNOSIS — Z8673 Personal history of transient ischemic attack (TIA), and cerebral infarction without residual deficits: Secondary | ICD-10-CM | POA: Diagnosis not present

## 2022-02-01 DIAGNOSIS — Z794 Long term (current) use of insulin: Secondary | ICD-10-CM | POA: Diagnosis not present

## 2022-02-01 DIAGNOSIS — E1169 Type 2 diabetes mellitus with other specified complication: Secondary | ICD-10-CM | POA: Diagnosis present

## 2022-02-01 DIAGNOSIS — R6520 Severe sepsis without septic shock: Secondary | ICD-10-CM

## 2022-02-01 DIAGNOSIS — I252 Old myocardial infarction: Secondary | ICD-10-CM | POA: Diagnosis not present

## 2022-02-01 DIAGNOSIS — Z809 Family history of malignant neoplasm, unspecified: Secondary | ICD-10-CM

## 2022-02-01 DIAGNOSIS — N179 Acute kidney failure, unspecified: Secondary | ICD-10-CM | POA: Diagnosis present

## 2022-02-01 DIAGNOSIS — Z7982 Long term (current) use of aspirin: Secondary | ICD-10-CM

## 2022-02-01 DIAGNOSIS — E782 Mixed hyperlipidemia: Secondary | ICD-10-CM | POA: Diagnosis present

## 2022-02-01 DIAGNOSIS — E871 Hypo-osmolality and hyponatremia: Secondary | ICD-10-CM | POA: Insufficient documentation

## 2022-02-01 DIAGNOSIS — E7800 Pure hypercholesterolemia, unspecified: Secondary | ICD-10-CM | POA: Diagnosis present

## 2022-02-01 DIAGNOSIS — R000 Tachycardia, unspecified: Secondary | ICD-10-CM | POA: Diagnosis not present

## 2022-02-01 DIAGNOSIS — Z79620 Long term (current) use of immunosuppressive biologic: Secondary | ICD-10-CM

## 2022-02-01 DIAGNOSIS — Z7984 Long term (current) use of oral hypoglycemic drugs: Secondary | ICD-10-CM

## 2022-02-01 DIAGNOSIS — Z8249 Family history of ischemic heart disease and other diseases of the circulatory system: Secondary | ICD-10-CM | POA: Diagnosis not present

## 2022-02-01 DIAGNOSIS — I739 Peripheral vascular disease, unspecified: Secondary | ICD-10-CM | POA: Diagnosis present

## 2022-02-01 DIAGNOSIS — Z91040 Latex allergy status: Secondary | ICD-10-CM

## 2022-02-01 DIAGNOSIS — I10 Essential (primary) hypertension: Secondary | ICD-10-CM | POA: Diagnosis present

## 2022-02-01 DIAGNOSIS — F32A Depression, unspecified: Secondary | ICD-10-CM | POA: Diagnosis present

## 2022-02-01 DIAGNOSIS — K219 Gastro-esophageal reflux disease without esophagitis: Secondary | ICD-10-CM | POA: Diagnosis present

## 2022-02-01 DIAGNOSIS — E739 Lactose intolerance, unspecified: Secondary | ICD-10-CM | POA: Diagnosis present

## 2022-02-01 DIAGNOSIS — K760 Fatty (change of) liver, not elsewhere classified: Secondary | ICD-10-CM | POA: Diagnosis present

## 2022-02-01 DIAGNOSIS — I152 Hypertension secondary to endocrine disorders: Secondary | ICD-10-CM | POA: Diagnosis present

## 2022-02-01 DIAGNOSIS — Z888 Allergy status to other drugs, medicaments and biological substances status: Secondary | ICD-10-CM

## 2022-02-01 DIAGNOSIS — N1831 Chronic kidney disease, stage 3a: Secondary | ICD-10-CM | POA: Insufficient documentation

## 2022-02-01 DIAGNOSIS — M1990 Unspecified osteoarthritis, unspecified site: Secondary | ICD-10-CM | POA: Diagnosis present

## 2022-02-01 DIAGNOSIS — Z1152 Encounter for screening for COVID-19: Secondary | ICD-10-CM

## 2022-02-01 DIAGNOSIS — K580 Irritable bowel syndrome with diarrhea: Secondary | ICD-10-CM | POA: Diagnosis present

## 2022-02-01 DIAGNOSIS — E669 Obesity, unspecified: Secondary | ICD-10-CM | POA: Insufficient documentation

## 2022-02-01 DIAGNOSIS — M797 Fibromyalgia: Secondary | ICD-10-CM | POA: Diagnosis present

## 2022-02-01 DIAGNOSIS — Z83438 Family history of other disorder of lipoprotein metabolism and other lipidemia: Secondary | ICD-10-CM

## 2022-02-01 DIAGNOSIS — E1159 Type 2 diabetes mellitus with other circulatory complications: Secondary | ICD-10-CM | POA: Diagnosis present

## 2022-02-01 DIAGNOSIS — Z6830 Body mass index (BMI) 30.0-30.9, adult: Secondary | ICD-10-CM | POA: Diagnosis not present

## 2022-02-01 DIAGNOSIS — R531 Weakness: Secondary | ICD-10-CM | POA: Diagnosis not present

## 2022-02-01 DIAGNOSIS — I639 Cerebral infarction, unspecified: Secondary | ICD-10-CM | POA: Diagnosis present

## 2022-02-01 LAB — HEPATIC FUNCTION PANEL
ALT: 21 U/L (ref 0–44)
AST: 27 U/L (ref 15–41)
Albumin: 4.4 g/dL (ref 3.5–5.0)
Alkaline Phosphatase: 82 U/L (ref 38–126)
Bilirubin, Direct: <0.1 mg/dL (ref 0.0–0.2)
Total Bilirubin: 0.5 mg/dL (ref 0.3–1.2)
Total Protein: 8.9 g/dL — ABNORMAL HIGH (ref 6.5–8.1)

## 2022-02-01 LAB — BASIC METABOLIC PANEL
Anion gap: 14 (ref 5–15)
BUN: 26 mg/dL — ABNORMAL HIGH (ref 6–20)
CO2: 23 mmol/L (ref 22–32)
Calcium: 9.9 mg/dL (ref 8.9–10.3)
Chloride: 96 mmol/L — ABNORMAL LOW (ref 98–111)
Creatinine, Ser: 1.21 mg/dL — ABNORMAL HIGH (ref 0.44–1.00)
GFR, Estimated: 51 mL/min — ABNORMAL LOW (ref >60)
Glucose, Bld: 379 mg/dL — ABNORMAL HIGH (ref 70–99)
Potassium: 3.9 mmol/L (ref 3.5–5.1)
Sodium: 133 mmol/L — ABNORMAL LOW (ref 135–145)

## 2022-02-01 LAB — URINALYSIS, ROUTINE W REFLEX MICROSCOPIC
Bilirubin Urine: NEGATIVE
Glucose, UA: >=500 mg/dL — AB
Ketones, ur: NEGATIVE mg/dL
Leukocytes,Ua: NEGATIVE
Nitrite: POSITIVE — AB
Protein, ur: >=300 mg/dL — AB
Specific Gravity, Urine: 1.017 (ref 1.005–1.030)
pH: 5 (ref 5.0–8.0)

## 2022-02-01 LAB — CBC
HCT: 40.4 % (ref 36.0–46.0)
Hemoglobin: 12.6 g/dL (ref 12.0–15.0)
MCH: 24.9 pg — ABNORMAL LOW (ref 26.0–34.0)
MCHC: 31.2 g/dL (ref 30.0–36.0)
MCV: 79.8 fL — ABNORMAL LOW (ref 80.0–100.0)
Platelets: 356 10*3/uL (ref 150–400)
RBC: 5.06 MIL/uL (ref 3.87–5.11)
RDW: 13.9 % (ref 11.5–15.5)
WBC: 19.2 10*3/uL — ABNORMAL HIGH (ref 4.0–10.5)
nRBC: 0 % (ref 0.0–0.2)

## 2022-02-01 LAB — PROCALCITONIN: Procalcitonin: 0.72 ng/mL

## 2022-02-01 LAB — HIV ANTIBODY (ROUTINE TESTING W REFLEX): HIV Screen 4th Generation wRfx: NONREACTIVE

## 2022-02-01 LAB — APTT: aPTT: 26 seconds (ref 24–36)

## 2022-02-01 LAB — PROTIME-INR
INR: 1.1 (ref 0.8–1.2)
Prothrombin Time: 14 seconds (ref 11.4–15.2)

## 2022-02-01 LAB — CBG MONITORING, ED
Glucose-Capillary: 348 mg/dL — ABNORMAL HIGH (ref 70–99)
Glucose-Capillary: 364 mg/dL — ABNORMAL HIGH (ref 70–99)
Glucose-Capillary: 293 mg/dL — ABNORMAL HIGH (ref 70–99)

## 2022-02-01 LAB — RESP PANEL BY RT-PCR (RSV, FLU A&B, COVID)  RVPGX2
Influenza A by PCR: NEGATIVE
Influenza B by PCR: NEGATIVE
Resp Syncytial Virus by PCR: NEGATIVE
SARS Coronavirus 2 by RT PCR: NEGATIVE

## 2022-02-01 LAB — PHOSPHORUS: Phosphorus: 4.2 mg/dL (ref 2.5–4.6)

## 2022-02-01 LAB — MAGNESIUM: Magnesium: 1.8 mg/dL (ref 1.7–2.4)

## 2022-02-01 LAB — LACTIC ACID, PLASMA
Lactic Acid, Venous: 2.5 mmol/L (ref 0.5–1.9)
Lactic Acid, Venous: 2.3 mmol/L (ref 0.5–1.9)

## 2022-02-01 MED ORDER — DAPAGLIFLOZIN PROPANEDIOL 10 MG PO TABS
10.0000 mg | ORAL_TABLET | Freq: Every day | ORAL | Status: DC
  Administered 2022-02-02 – 2022-02-04 (×3): 10 mg via ORAL
  Filled 2022-02-01 (×3): qty 1

## 2022-02-01 MED ORDER — SODIUM CHLORIDE 0.9 % IV BOLUS (SEPSIS)
500.0000 mL | Freq: Once | INTRAVENOUS | Status: AC
  Administered 2022-02-01: 500 mL via INTRAVENOUS

## 2022-02-01 MED ORDER — FLUOXETINE HCL 20 MG PO CAPS
80.0000 mg | ORAL_CAPSULE | Freq: Every day | ORAL | Status: DC
  Administered 2022-02-01 – 2022-02-04 (×4): 80 mg via ORAL
  Filled 2022-02-01 (×5): qty 4

## 2022-02-01 MED ORDER — SODIUM CHLORIDE 0.9 % IV SOLN
500.0000 mg | Freq: Once | INTRAVENOUS | Status: AC
  Administered 2022-02-01: 500 mg via INTRAVENOUS
  Filled 2022-02-01: qty 5

## 2022-02-01 MED ORDER — ACETAMINOPHEN 325 MG PO TABS: 650.0000 mg | ORAL_TABLET | Freq: Four times a day (QID) | ORAL | Status: DC | PRN

## 2022-02-01 MED ORDER — SENNOSIDES-DOCUSATE SODIUM 8.6-50 MG PO TABS: 1.0000 | ORAL_TABLET | Freq: Every evening | ORAL | Status: DC | PRN

## 2022-02-01 MED ORDER — NICOTINE POLACRILEX 2 MG MT GUM: 2.0000 mg | CHEWING_GUM | OROMUCOSAL | Status: DC | PRN

## 2022-02-01 MED ORDER — LACTATED RINGERS IV BOLUS
1000.0000 mL | Freq: Once | INTRAVENOUS | Status: AC
  Administered 2022-02-01: 1000 mL via INTRAVENOUS

## 2022-02-01 MED ORDER — ASPIRIN 81 MG PO CHEW
81.0000 mg | CHEWABLE_TABLET | Freq: Every day | ORAL | Status: DC
  Administered 2022-02-01 – 2022-02-04 (×4): 81 mg via ORAL
  Filled 2022-02-01 (×4): qty 1

## 2022-02-01 MED ORDER — GABAPENTIN 600 MG PO TABS
600.0000 mg | ORAL_TABLET | Freq: Three times a day (TID) | ORAL | Status: DC
  Administered 2022-02-01 – 2022-02-04 (×7): 600 mg via ORAL
  Filled 2022-02-01 (×11): qty 1

## 2022-02-01 MED ORDER — TIZANIDINE HCL 4 MG PO TABS
4.0000 mg | ORAL_TABLET | Freq: Two times a day (BID) | ORAL | Status: DC | PRN
  Administered 2022-02-02 – 2022-02-04 (×2): 4 mg via ORAL
  Filled 2022-02-01 (×3): qty 1

## 2022-02-01 MED ORDER — MIRTAZAPINE 15 MG PO TABS
15.0000 mg | ORAL_TABLET | Freq: Every day | ORAL | Status: DC
  Administered 2022-02-01 – 2022-02-03 (×3): 15 mg via ORAL
  Filled 2022-02-01 (×3): qty 1

## 2022-02-01 MED ORDER — ACETAMINOPHEN 500 MG PO TABS
1000.0000 mg | ORAL_TABLET | Freq: Once | ORAL | Status: AC
  Administered 2022-02-01: 1000 mg via ORAL
  Filled 2022-02-01: qty 2

## 2022-02-01 MED ORDER — NITROGLYCERIN 0.2 MG/HR TD PT24
0.2000 mg | MEDICATED_PATCH | Freq: Every day | TRANSDERMAL | Status: DC
  Administered 2022-02-01 – 2022-02-04 (×4): 0.2 mg via TRANSDERMAL
  Filled 2022-02-01 (×4): qty 1

## 2022-02-01 MED ORDER — MELATONIN 5 MG PO TABS
5.0000 mg | ORAL_TABLET | Freq: Every evening | ORAL | Status: DC | PRN
  Filled 2022-02-01: qty 1

## 2022-02-01 MED ORDER — NICOTINE 21 MG/24HR TD PT24: 21.0000 mg | MEDICATED_PATCH | Freq: Every day | TRANSDERMAL | Status: DC | PRN

## 2022-02-01 MED ORDER — SODIUM CHLORIDE 0.9 % IV SOLN
500.0000 mg | INTRAVENOUS | Status: DC
  Administered 2022-02-02 – 2022-02-03 (×2): 500 mg via INTRAVENOUS
  Filled 2022-02-01 (×3): qty 5

## 2022-02-01 MED ORDER — ENOXAPARIN SODIUM 60 MG/0.6ML IJ SOSY
0.5000 mg/kg | PREFILLED_SYRINGE | INTRAMUSCULAR | Status: DC
  Administered 2022-02-01 – 2022-02-03 (×3): 45 mg via SUBCUTANEOUS
  Filled 2022-02-01 (×3): qty 0.6

## 2022-02-01 MED ORDER — INSULIN ASPART 100 UNIT/ML IJ SOLN
0.0000 [IU] | Freq: Three times a day (TID) | INTRAMUSCULAR | Status: DC
  Administered 2022-02-02 (×2): 5 [IU] via SUBCUTANEOUS
  Administered 2022-02-02: 8 [IU] via SUBCUTANEOUS
  Administered 2022-02-03: 5 [IU] via SUBCUTANEOUS
  Administered 2022-02-03: 2 [IU] via SUBCUTANEOUS
  Administered 2022-02-03: 8 [IU] via SUBCUTANEOUS
  Administered 2022-02-04 (×2): 5 [IU] via SUBCUTANEOUS
  Filled 2022-02-01 (×8): qty 1

## 2022-02-01 MED ORDER — SODIUM CHLORIDE 0.9 % IV SOLN
2.0000 g | Freq: Once | INTRAVENOUS | Status: AC
  Administered 2022-02-01: 2 g via INTRAVENOUS
  Filled 2022-02-01: qty 20

## 2022-02-01 MED ORDER — PENTOXIFYLLINE ER 400 MG PO TBCR
400.0000 mg | EXTENDED_RELEASE_TABLET | Freq: Two times a day (BID) | ORAL | Status: DC
  Administered 2022-02-01 – 2022-02-04 (×6): 400 mg via ORAL
  Filled 2022-02-01 (×7): qty 1

## 2022-02-01 MED ORDER — ONDANSETRON HCL 4 MG/2ML IJ SOLN: 4.0000 mg | Freq: Four times a day (QID) | INTRAMUSCULAR | Status: DC | PRN

## 2022-02-01 MED ORDER — CLOPIDOGREL BISULFATE 75 MG PO TABS
75.0000 mg | ORAL_TABLET | Freq: Every day | ORAL | Status: DC
  Administered 2022-02-01 – 2022-02-04 (×4): 75 mg via ORAL
  Filled 2022-02-01 (×4): qty 1

## 2022-02-01 MED ORDER — ONDANSETRON HCL 4 MG PO TABS: 4.0000 mg | ORAL_TABLET | Freq: Four times a day (QID) | ORAL | Status: DC | PRN

## 2022-02-01 MED ORDER — LOSARTAN POTASSIUM 25 MG PO TABS
25.0000 mg | ORAL_TABLET | Freq: Every day | ORAL | Status: DC
  Administered 2022-02-01 – 2022-02-04 (×4): 25 mg via ORAL
  Filled 2022-02-01 (×4): qty 1

## 2022-02-01 MED ORDER — SODIUM CHLORIDE 0.9 % IV SOLN
2.0000 g | INTRAVENOUS | Status: DC
  Administered 2022-02-02 – 2022-02-04 (×3): 2 g via INTRAVENOUS
  Filled 2022-02-01 (×2): qty 20
  Filled 2022-02-01: qty 2

## 2022-02-01 MED ORDER — TRAMADOL HCL 50 MG PO TABS
50.0000 mg | ORAL_TABLET | Freq: Four times a day (QID) | ORAL | Status: DC | PRN
  Administered 2022-02-02 – 2022-02-03 (×3): 50 mg via ORAL
  Filled 2022-02-01 (×3): qty 1

## 2022-02-01 MED ORDER — AMLODIPINE BESYLATE 10 MG PO TABS
10.0000 mg | ORAL_TABLET | Freq: Every day | ORAL | Status: DC
  Administered 2022-02-02 – 2022-02-04 (×3): 10 mg via ORAL
  Filled 2022-02-01: qty 1
  Filled 2022-02-01: qty 2
  Filled 2022-02-01: qty 1

## 2022-02-01 MED ORDER — INSULIN ASPART 100 UNIT/ML IJ SOLN
0.0000 [IU] | Freq: Every day | INTRAMUSCULAR | Status: DC
  Administered 2022-02-01: 2 [IU] via SUBCUTANEOUS
  Administered 2022-02-03: 3 [IU] via SUBCUTANEOUS
  Filled 2022-02-01 (×2): qty 1

## 2022-02-01 MED ORDER — ACETAMINOPHEN 650 MG RE SUPP: 650.0000 mg | Freq: Four times a day (QID) | RECTAL | Status: DC | PRN

## 2022-02-01 MED ORDER — ALBUTEROL SULFATE (2.5 MG/3ML) 0.083% IN NEBU: 2.5000 mg | INHALATION_SOLUTION | Freq: Four times a day (QID) | RESPIRATORY_TRACT | Status: DC | PRN

## 2022-02-01 NOTE — ED Notes (Signed)
Assumed care from [place], EMT-P. Pt resting comfortably in bed at this time. Pt denies any current needs or questions. Call light with in reach.

## 2022-02-01 NOTE — H&P (Addendum)
History and Physical   Morgan Daniels BJY:[id number] DOB: [birth year] DOA: 02/01/2022  PCP: Jonetta Osgood, NP  Outpatient Specialists: Dr. Leanna Sato clinic cardiology Patient coming from: Home via EMS  I have personally briefly reviewed patient's old medical records in [place].  Chief Concern: Fall, weakness  HPI: [name] is a 61 year old female with history of CVA, hypertension, hyperlipidemia, CAD status post CABG who presents emergency department for chief concerns of fall.  Initial vitals in the emergency department showed temperature of 99.1, respiration rate of 18, heart rate of 109, blood pressure 156/81, reported SpO2 in the high 80s on room air.  Patient had improved SpO2 of 91% on 3 L nasal cannula.  Serum sodium is 133, potassium 3.9, chloride of 96, bicarb 33, BUN of 26, serum creatinine of 1.21, eGFR 51, nonfasting blood glucose 376, WBC 19.2, hemoglobin 12.6, platelets of 356.  Procalcitonin is 0.72.  WBC 19.2, hemoglobin 12.6, platelets of 356.  Lactic acid was 2.5.  Blood cultures x 2 are in process.    Chest x-ray 2 view: Read as right basilar heterogenous pulmonary opacity, concerning for pneumonia.  Left basilar atelectasis.  CT head without contrast: Has been ordered and pending read.  ED treatment: Azithromycin 500 mg IV one-time dose, ceftriaxone 2 g IV one-time dose, LR 2 L bolus, acetaminophen 1 g p.o.  At bedside, she was able to tell me her name, age, current location. She endorses weakness over the last week. She has IBS so she has diarrhea daily. She had a bowel movement today and also had one epside of naueas and vomtiing.   She was nauseous and vomited up her food today. She denies blood and coffee ground emesis. She had subjective fever at home and chills.  She denies chest pain, abdominal pain, dysuria hematuria.  She denies known sick contacts that she does not go anywhere.  She denies changes to her baseline  diarrhea.  Social history: She lives at home with her husband.  She denies smoking. She quit last year. She denies etoh, recreational drug.  She is currently retired.  ROS: Constitutional: no weight change, no fever ENT/Mouth: no sore throat, no rhinorrhea Eyes: no eye pain, no vision changes Cardiovascular: no chest pain, no dyspnea,  no edema, no palpitations Respiratory: no cough, no sputum, no wheezing Gastrointestinal: + nausea, + vomiting, + chronic diarrhea, no constipation Genitourinary: no urinary incontinence, no dysuria, no hematuria Musculoskeletal: no arthralgias, no myalgias Skin: no skin lesions, no pruritus, Neuro: + weakness, no loss of consciousness, no syncope Psych: no anxiety, no depression, + decrease appetite Heme/Lymph: no bruising, no bleeding  ED Course: Discussed with emergency medicine provider, patient requiring hospitalization for chief concerns of right lower lobe pneumonia concerning for sepsis.  Assessment/Plan  Principal Problem:   Severe sepsis with acute organ dysfunction (HCC) Active Problems:   CAP (community acquired pneumonia)   Stroke [facility])   Essential hypertension   PAD (peripheral artery disease) ([place])   Coronary artery disease   TOBACCO ABUSE   GERD   Mixed hyperlipidemia   Uncontrolled type 2 diabetes mellitus with hyperglycemia (HCC)   Acute respiratory failure with hypoxia (HCC)   Hypercholesteremia   S/P CABG x 4   Stage 3a chronic kidney disease (CKD) (HCC)   Assessment and Plan:  * Severe sepsis with acute organ dysfunction ([place]) - Presumed secondary to community-acquired pneumonia of the right lower lobe - Patient had elevated lactic acid 2.5, leukocytosis of 19.2, increased respiration  rate, increased heart rate, organ involvement: Respiratory  Stroke ([place]) - Resumed home aspirin, Plavix  CAP (community acquired pneumonia) - Right lower lobe pneumonia - Continue azithromycin 500 mg IV daily, ceftriaxone 2 g IV daily  to complete 5-day course - Incentive spirometry, flutter valve every 2 hours while awake - Albuterol nebulizer every 6 hours as needed for shortness of breath and wheezing, 4 days ordered - Admit to inpatient, progressive cardiac  Essential hypertension - Amlodipine 10 mg daily, losartan 25 mg daily resumed  PAD (peripheral artery disease) (HCC) - Plavix 75 mg daily, aspirin 81 mg, Trental 400 mg p.o. twice daily resumed  Stage 3a chronic kidney disease (CKD) (HCC) - Appears to be at baseline  S/P CABG x 4 - Resumed home aspirin, Plavix, losartan 25 mg daily, amlodipine 10 mg daily  Acute respiratory failure with hypoxia (HCC) - Presumed secondary to right lower lobe pneumonia - Continue oxygen supplementation to maintain SpO2 greater than 92%  Uncontrolled type 2 diabetes mellitus with hyperglycemia (HCC) - Insulin SSI with at bedtime coverage ordered  Mixed hyperlipidemia - Patient is on Praluent monthly injections  TOBACCO ABUSE - Former tobacco use, she reports that she has quit smoking since her triple bypass surgery in 2022 - She uses nicotine gum occasionally - Nicorette gum as needed ordered  Depression-resumed home mirtazapine 15 mg nightly, fluoxetine 80 mg daily  Chart reviewed.   Complete echo on 09/19/2021: Left ventricular ejection fraction by estimation is 65 to 70%.  Left ventricular diastolic parameters are consistent with grade 1 diastolic dysfunction.  DVT prophylaxis: Enoxaparin Code Status: Full code Diet: Heart healthy/carb modified Family Communication: Updated spouse at bedside with patient's permission Disposition Plan: Pending clinical course Consults called: None at this time Admission status: Inpatient, progressive cardiac  Past Medical History:  Diagnosis Date   Anxiety    Arthritis    Bilateral carotid artery stenosis 2014   Carotid artery occlusion    Chronic kidney disease 06/18/2015   UTI   Chronic kidney disease 2017   Current  smoker    CVA (cerebral vascular accident) ([place]) 2013   Depression    Diabetes ([place])    Diverticulosis    Fatty liver    Fibromyalgia    GERD (gastroesophageal reflux disease)    H/O hiatal hernia    Heart attack ([place]) 01/12/2021   Hiatal hernia    Hypercholesteremia    Hypertension    IBS (irritable bowel syndrome)    PAD (peripheral artery disease) ([place])    Peptic ulcer    Plantar fasciitis    Stroke ([place]) 01/31/2012   Right side-ministroke   T2DM (type 2 diabetes mellitus) ([place])    Past Surgical History:  Procedure Laterality Date   ABDOMINAL HYSTERECTOMY  1990   BACK SURGERY  2000, 2004   CAROTID ENDARTERECTOMY Left 05/06/12   CARPAL TUNNEL RELEASE Left 07/18/2015   Procedure: CARPAL TUNNEL RELEASE;  Surgeon: Earnestine Leys, MD;  Location: [facility] ORS;  Service: Orthopedics;  Laterality: Left;   CEREBRAL ANGIOGRAM Bilateral 05/03/2012   Procedure: CEREBRAL ANGIOGRAM;  Surgeon: Angelia Mould, MD;  Location: [facility] CATH LAB;  Service: Cardiovascular;  Laterality: Bilateral;   CHOLECYSTECTOMY  2001   COLONOSCOPY WITH PROPOFOL N/A 11/19/2018   Procedure: COLONOSCOPY WITH PROPOFOL;  Surgeon: Lucilla Lame, MD;  Location: [place];  Service: Endoscopy;  Laterality: N/A;  Diabetic - insulin   CORONARY ARTERY BYPASS GRAFT N/A 01/18/2021   Procedure: CORONARY ARTERY BYPASS GRAFTING (CABG) x 4  USING LEFT INTERNAL MAMMARY ARTERY AND LEFT ENDOSCOPIC GREATER SAPHENOUS VEIN CONDUITS;  Surgeon: [name], MD;  Location: [employer];  Service: Open Heart Surgery;  Laterality: N/A;   CORONARY/GRAFT ACUTE MI REVASCULARIZATION N/A 01/13/2021   Procedure: Coronary/Graft Acute MI Revascularization;  Surgeon: Isaias Cowman, MD;  Location: [place] CV LAB;  Service: Cardiovascular;  Laterality: N/A;   ENDARTERECTOMY Left 05/06/2012   Procedure: ENDARTERECTOMY CAROTID;  Surgeon: Angelia Mould, MD;  Location: [place];  Service: Vascular;  Laterality: Left;    ENDARTERECTOMY Right 08/09/2013   Procedure: ENDARTERECTOMY CAROTID-RIGHT;  Surgeon: Angelia Mould, MD;  Location: [place];  Service: Vascular;  Laterality: Right;   ENDOVEIN HARVEST OF GREATER SAPHENOUS VEIN Left 01/18/2021   Procedure: ENDOVEIN HARVEST OF GREATER SAPHENOUS VEIN;  Surgeon: [name], MD;  Location: [place];  Service: Open Heart Surgery;  Laterality: Left;   HERNIA REPAIR     IABP INSERTION Right 01/13/2021   Procedure: IABP Insertion;  Surgeon: Isaias Cowman, MD;  Location: [place] CV LAB;  Service: Cardiovascular;  Laterality: Right;   LEFT HEART CATH AND CORONARY ANGIOGRAPHY N/A 01/13/2021   Procedure: LEFT HEART CATH AND CORONARY ANGIOGRAPHY;  Surgeon: Isaias Cowman, MD;  Location: [place] CV LAB;  Service: Cardiovascular;  Laterality: N/A;   LOWER EXTREMITY ANGIOGRAPHY Left 01/03/2021   Procedure: LOWER EXTREMITY ANGIOGRAPHY;  Surgeon: Algernon Huxley, MD;  Location: [place] CV LAB;  Service: Cardiovascular;  Laterality: Left;   LOWER EXTREMITY ANGIOGRAPHY Left 02/28/2021   Procedure: LOWER EXTREMITY ANGIOGRAPHY;  Surgeon: Algernon Huxley, MD;  Location: [place] CV LAB;  Service: Cardiovascular;  Laterality: Left;   PATCH ANGIOPLASTY Left 05/06/2012   Procedure: WITH DACRON PATCH ANGIOPLASTY ;  Surgeon: Angelia Mould, MD;  Location: [place];  Service: Vascular;  Laterality: Left;   POLYPECTOMY  11/19/2018   Procedure: POLYPECTOMY;  Surgeon: Lucilla Lame, MD;  Location: [place];  Service: Endoscopy;;   SPINE SURGERY  2004   TEE WITHOUT CARDIOVERSION N/A 01/18/2021   Procedure: TRANSESOPHAGEAL ECHOCARDIOGRAM (TEE);  Surgeon: [name], MD;  Location: [place];  Service: Open Heart Surgery;  Laterality: N/A;   TONSILLECTOMY     TUBAL LIGATION     Social History:  reports that she has quit smoking. Her smoking use included cigarettes. She started smoking about 51 years ago. She has a 40.00 pack-year smoking  history. She has been exposed to tobacco smoke. She has never used smokeless tobacco. She reports that she does not currently use alcohol. She reports that she does not use drugs.  Allergies  Allergen Reactions   Simvastatin Diarrhea and Nausea And Vomiting   Morphine And Related Itching   Lactose Intolerance (Gi) Diarrhea    bloating   Latex Rash    Rash when she wears gloves (Negative by test - per pt)   Family History  Problem Relation Age of Onset   Hypertension Mother    Hyperlipidemia Mother    Deep vein thrombosis Mother    Cancer Father    Alcohol abuse Father    Heart failure Father    Hypertension Maternal Grandmother    Deep vein thrombosis Sister    Alcohol abuse Sister    Alcohol abuse Sister    Family history: Family history reviewed and not pertinent  Prior to Admission medications   Medication Sig Start Date End Date Taking? Authorizing Provider  Alirocumab (PRALUENT) 150 MG/ML SOAJ Inject 150 mg into the skin every  30 (thirty) days. 01/15/22   Jonetta Osgood, NP  amLODipine (NORVASC) 10 MG tablet Take 1 tablet (10 mg total) by mouth daily. 01/15/22   Jonetta Osgood, NP  aspirin 81 MG chewable tablet Chew 1 tablet (81 mg total) by mouth daily. 01/31/21   Angiulli, Lavon Paganini, PA-C  Cholecalciferol (VITAMIN D-3) 125 MCG (5000 UT) TABS Take 5,000 Units by mouth daily. 01/31/21   Angiulli, Lavon Paganini, PA-C  collagenase (SANTYL) 250 UNIT/GM ointment Apply topically daily. 09/20/21   Loletha Grayer, MD  Continuous Blood Gluc Receiver (FREESTYLE LIBRE 2 READER) DEVI As directed 05/29/20   Cassandria Anger, MD  Continuous Blood Gluc Sensor (FREESTYLE LIBRE 2 SENSOR) MISC USE TO TEST BLOOD GLUCOSE 4 TIMES A DAY . DX E11.65 08/28/21   Jonetta Osgood, NP  dicyclomine (BENTYL) 10 MG capsule TAKE 1 CAPSULE (10 MG TOTAL) BY MOUTH 3 (THREE) TIMES DAILY BEFORE MEALS. 01/03/22   Abernathy, Alyssa, NP  FARXIGA 10 MG TABS tablet TAKE 1 TABLET BY MOUTH DAILY BEFORE BREAKFAST.  12/09/21   Jonetta Osgood, NP  FLUoxetine (PROZAC) 40 MG capsule Take 2 capsules (80 mg total) by mouth daily. 01/15/22   Jonetta Osgood, NP  gabapentin (NEURONTIN) 600 MG tablet Take 1 tablet (600 mg total) by mouth in the morning, at noon, in the evening, and at bedtime. 08/21/21   Jonetta Osgood, NP  gemfibrozil (LOPID) 600 MG tablet Take 1 tablet (600 mg total) by mouth 2 (two) times daily. 08/21/21   Jonetta Osgood, NP  insulin aspart (NOVOLOG FLEXPEN) 100 UNIT/ML FlexPen Inject 20 Units into the skin 3 (three) times daily before meals. Needs appointment for further refills. Patient taking differently: Inject 0-25 Units into the skin 3 (three) times daily before meals. Sliding scale 04/24/21   Cassandria Anger, MD  losartan (COZAAR) 25 MG tablet Take 1 tablet (25 mg total) by mouth daily. 01/15/22   Jonetta Osgood, NP  metoprolol succinate (TOPROL-XL) 50 MG 24 hr tablet Take 1 tablet (50 mg total) by mouth daily. 08/21/21   Jonetta Osgood, NP  mirtazapine (REMERON) 15 MG tablet Take 1 tablet (15 mg total) by mouth at bedtime. 01/15/22   Jonetta Osgood, NP  nitroGLYCERIN (NITRODUR - DOSED IN MG/24 HR) 0.2 mg/hr patch Place 1 patch (0.2 mg total) onto the skin daily. 07/10/21 07/10/22  Izora Ribas, MD  rosuvastatin (CRESTOR) 40 MG tablet Take 1 tablet (40 mg total) by mouth daily. 01/15/22   Jonetta Osgood, NP  tiZANidine (ZANAFLEX) 4 MG tablet TAKE 1 TABLET(S) BY MOUTH TWICE A DAY AS NEEDED FOR MUSCLE PAIN AND SPASMS 01/15/22   Abernathy, Yetta Flock, NP  traMADol (ULTRAM) 50 MG tablet TAKE 1 TABLET BY MOUTH 3 (THREE) TIMES DAILY AS NEEDED FOR MODERATE PAIN OR SEVERE PAIN. 01/15/22   Jonetta Osgood, NP  TRESIBA FLEXTOUCH 200 UNIT/ML FlexTouch Pen INJECT 100 UNITS INTO THE SKIN AT BEDTIME. 09/01/21   Abernathy, Yetta Flock, NP  ULTICARE MICRO PEN NEEDLES 32G X 4 MM MISC TO USE WITH INSULIN DOSING THREE TIMES DAILY PRIOR TO MEALS AND AT BEDTIME FOR BASAL INSULIN. 12/24/20   [provider]   Physical Exam: Vitals:   02/01/22 1730 02/01/22 1745 02/01/22 1800 02/01/22 1815  BP: (!) 120/92 (!) 168/83 (!) 178/83 (!) 174/93  Pulse: 96 94 94 98  Resp: 17 19  (!) 21  Temp:      TempSrc:      SpO2: 99% 97% 100% 97%  Weight:      Height:  Constitutional: appears older than chronological age, frail, NAD, calm, comfortable Eyes: PERRL, lids and conjunctivae normal ENMT: Mucous membranes are moist. Posterior pharynx clear of any exudate or lesions. Age-appropriate dentition. Hearing appropriate Neck: normal, supple, no masses, no thyromegaly Respiratory: no wheezing. + right lower lung crackles. Normal respiratory effort. No accessory muscle use. [place] in place. Cardiovascular: Regular rate and rhythm, no murmurs / rubs / gallops. No extremity edema. 2+ pedal pulses. No carotid bruits.  Abdomen: no tenderness, no masses palpated, no hepatosplenomegaly. Bowel sounds positive.  Musculoskeletal: no clubbing / cyanosis. No joint deformity upper and lower extremities. Good ROM, no contractures, no atrophy. Normal muscle tone.  Skin: no rashes, lesions, ulcers. No induration Neurologic: Sensation intact. Strength 5/5 in all 4.  Psychiatric: Normal judgment and insight. Alert and oriented x 3. Depressed mood.   EKG: independently reviewed, showing sinus rhythm with rate of 96, QTc 459  Chest x-ray on Admission: I personally reviewed and I agree with radiologist reading as below.  CT HEAD WO CONTRAST (5MM)  Result Date: 02/01/2022 CLINICAL DATA:  61 year old female with altered mental status. EXAM: CT HEAD WITHOUT CONTRAST TECHNIQUE: Contiguous axial images were obtained from the base of the skull through the vertex without intravenous contrast. RADIATION DOSE REDUCTION: This exam was performed according to the departmental dose-optimization program which includes automated exposure control, adjustment of the mA and/or kV according to patient size and/or use of iterative  reconstruction technique. COMPARISON:  09/18/2021 head CT FINDINGS: Brain: No evidence of acute infarction, hemorrhage, hydrocephalus, extra-axial collection or mass lesion/mass effect. Atrophy, remote white matter infarcts and chronic small-vessel white matter ischemic changes again noted. Vascular: Carotid atherosclerotic calcifications are noted. Skull: Normal. Negative for fracture or focal lesion. Sinuses/Orbits: No acute finding. Other: None. IMPRESSION: 1. No evidence of acute intracranial abnormality. 2. Atrophy, remote white matter infarcts and chronic small-vessel white matter ischemic changes. Electronically Signed   By: Margarette Canada M.D.   On: 02/01/2022 15:13   DG Chest 2 View  Result Date: 02/01/2022 CLINICAL DATA:  Rule out pneumonia EXAM: CHEST - 2 VIEW COMPARISON:  Chest x-ray August 20, 2021 FINDINGS: The cardiomediastinal silhouette is unchanged in contour status post median sternotomy and CABG. Right basilar heterogeneous pulmonary opacity. Left basilar linear/bandlike opacity, likely atelectasis. No pleural effusion or pneumothorax. The visualized upper abdomen is unremarkable. No acute osseous abnormality. IMPRESSION: 1. Right basilar heterogeneous pulmonary opacity, concerning for pneumonia. 2. Left basilar atelectasis. Electronically Signed   By: Beryle Flock M.D.   On: 02/01/2022 13:52    Labs on Admission: I have personally reviewed following labs  CBC: Recent Labs  Lab 02/01/22 1118  WBC 19.2*  HGB 12.6  HCT 40.4  MCV 79.8*  PLT 527   Basic Metabolic Panel: Recent Labs  Lab 02/01/22 1118 02/01/22 1524  NA 133*  --   K 3.9  --   CL 96*  --   CO2 23  --   GLUCOSE 379*  --   BUN 26*  --   CREATININE 1.21*  --   CALCIUM 9.9  --   MG  --  1.8  PHOS  --  4.2   Coagulation Profile: Recent Labs  Lab 02/01/22 1118  INR 1.1   CBG: Recent Labs  Lab 02/01/22 1111 02/01/22 1640  GLUCAP 348* 364*   Urine analysis:    Component Value Date/Time    COLORURINE STRAW (A) 09/18/2021 0852   APPEARANCEUR CLEAR (A) 09/18/2021 0852   APPEARANCEUR Clear 12/25/2020 1432  LABSPEC 1.012 09/18/2021 0852   LABSPEC 1.049 07/29/2013 1056   PHURINE 6.0 09/18/2021 0852   GLUCOSEU >=500 (A) 09/18/2021 0852   GLUCOSEU Negative 07/29/2013 1056   HGBUR NEGATIVE 09/18/2021 0852   BILIRUBINUR NEGATIVE 09/18/2021 0852   BILIRUBINUR Negative 05/22/2021 0856   BILIRUBINUR Negative 12/25/2020 1432   BILIRUBINUR Negative 07/29/2013 1056   KETONESUR NEGATIVE 09/18/2021 0852   PROTEINUR >=300 (A) 09/18/2021 0852   UROBILINOGEN 2.0 (A) 05/22/2021 0856   UROBILINOGEN 0.2 08/08/2013 1037   NITRITE NEGATIVE 09/18/2021 0852   LEUKOCYTESUR NEGATIVE 09/18/2021 0852   LEUKOCYTESUR 3+ 07/29/2013 1056   This document was prepared using Dragon Voice Recognition software and may include unintentional dictation errors.  Dr. Tobie Poet [facility]  If 7PM-7AM, please contact overnight-coverage provider If 7AM-7PM, please contact day coverage provider [email or web address]  02/01/2022, 6:29 PM

## 2022-02-01 NOTE — ED Notes (Signed)
CODE SEPSIS - PHARMACY COMMUNICATION  **Broad Spectrum Antibiotics should be administered within 1 hour of Sepsis diagnosis**  Time Code Sepsis Called/Page Received: 1506  Antibiotics Ordered: ceftriaxone  Time of 1st antibiotic administration: 1417  Additional action taken by pharmacy: [place] ,PharmD Clinical Pharmacist  02/01/2022  3:12 PM

## 2022-02-01 NOTE — Assessment & Plan Note (Signed)
-   Former tobacco use, she reports that she has quit smoking since her triple bypass surgery in 2022 - She uses nicotine gum occasionally - Nicorette gum as needed ordered

## 2022-02-01 NOTE — ED Provider Notes (Signed)
[facility] Provider Note    Event Date/Time   First MD Initiated Contact with Patient 02/01/22 1333     (approximate)   History   Fall   HPI  Morgan Daniels is a 61 y.o. female with past medical history of past medical history of CVA, hypertension, hyperlipidemia, artery disease, coronary disease status post CABG presents because of generalized weakness and a fall.  Patient is not the best historian and seems to be a bit confused but tells me she is just not been feeling very well over the last several days and then today she was sitting on the toilet and got tired of sitting on the toilet so she says she purposely fell to the ground did hit her right shoulder but denies hitting her head.  She feels like she had a fever today denies cough congestion shortness of breath or chest pain.  She did have several episodes of emesis today and had diarrhea but she tells that the diarrhea is normal for her.  Denies abdominal pain or urinary symptoms.  Denies headache numbness tingling or weakness.  Says she has not really been able to ambulate today because of feeling generally weak.  Also says that her body hurts to touch diffusely.  Past Medical History:  Diagnosis Date   Anxiety    Arthritis    Bilateral carotid artery stenosis 2014   Carotid artery occlusion    Chronic kidney disease 06/18/2015   UTI   Chronic kidney disease 2017   Current smoker    CVA (cerebral vascular accident) ([place]) 2013   Depression    Diabetes ([place])    Diverticulosis    Fatty liver    Fibromyalgia    GERD (gastroesophageal reflux disease)    H/O hiatal hernia    Heart attack ([place]) 01/12/2021   Hiatal hernia    Hypercholesteremia    Hypertension    IBS (irritable bowel syndrome)    PAD (peripheral artery disease) (HCC)    Peptic ulcer    Plantar fasciitis    Stroke ([place]) 01/31/2012   Right side-ministroke   T2DM (type 2 diabetes mellitus) ([place])     Patient Active Problem List    Diagnosis Date Noted   Non healing left heel wound    Stroke ([place]) 09/18/2021   Coronary artery disease 09/18/2021   Dizziness    PAD (peripheral artery disease) ([place]) 02/26/2021   Debility 01/25/2021   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   Encephalopathy acute    Acute respiratory failure with hypoxia and hypercapnia ([place]) 01/13/2021   NSTEMI (non-ST elevated myocardial infarction) ([place]) 01/13/2021   Hypercholesteremia 01/13/2021   Tobacco abuse 01/13/2021   Cardiogenic shock ([place]) 01/13/2021   Encounter for screening mammogram for malignant neoplasm of breast 01/03/2019   Polyp of sigmoid colon    Irritable bowel syndrome with diarrhea 12/30/2017   Uncontrolled type 2 diabetes mellitus with hyperglycemia ([place]) 09/29/2017   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   Depression, major, single episode, moderate ([place]) 09/29/2017   Encounter for general adult medical examination with abnormal findings 06/11/2017   Type 2 diabetes mellitus with hyperglycemia, with long-term current use of insulin ([place]) 06/11/2017   Vitamin D deficiency 06/11/2017   Mixed hyperlipidemia 06/11/2017   Acquired hypothyroidism 06/11/2017   Essential hypertension 03/02/2017   Impingement syndrome of shoulder region 01/24/2016   Carotid stenosis 08/09/2013   Carotid artery stenosis with cerebral infarction ([place]) 08/03/2013   Aftercare following surgery of the circulatory system,  NEC 12/08/2012   Occlusion and stenosis of carotid artery without mention of cerebral infarction 04/28/2012   HYPERCHOLESTEROLEMIA 06/01/2006   PANIC ATTACK 06/01/2006   TOBACCO ABUSE 06/01/2006   DEPRESSION 06/01/2006   ALLERGIC RHINITIS 06/01/2006   GERD 06/01/2006   DIVERTICULAR DISEASE 06/01/2006   MIGRAINES, HX OF 06/01/2006     Physical Exam  Triage Vital Signs: ED Triage Vitals  Enc Vitals Group     BP 02/01/22 1105 (!) 156/81     Pulse Rate 02/01/22 1105 (!) 109     Resp 02/01/22 1105 18     Temp 02/01/22 1105 99.1 F  (37.3 C)     Temp Source 02/01/22 1105 Oral     SpO2 02/01/22 1105 (!) 88 %     Weight --      Height --      Head Circumference --      Peak Flow --      Pain Score 02/01/22 1106 2     Pain Loc --      Pain Edu? --      Excl. in [place]? --     Most recent vital signs: Vitals:   02/01/22 1105 02/01/22 1108  BP: (!) 156/81   Pulse: (!) 109   Resp: 18   Temp: 99.1 F (37.3 C)   SpO2: (!) 88% 94%     General: Awake, patient looks somewhat tired but nontoxic CV:  Good peripheral perfusion.  No peripheral edema Resp:  Normal effort.  No increased work of breathing, there is crackles at the right base, the rest of the lungs are clear there is good air movement Abd:  No distention.  Abdomen is soft and nontender Neuro:             Awake, Alert, Oriented x 3 patient is somewhat slow to respond and intermittently forgetful but there is no dysarthria or aphasia Other:     ED Results / Procedures / Treatments  Labs (all labs ordered are listed, but only abnormal results are displayed) Labs Reviewed  BASIC METABOLIC PANEL - Abnormal; Notable for the following components:      Result Value   Sodium 133 (*)    Chloride 96 (*)    Glucose, Bld 379 (*)    BUN 26 (*)    Creatinine, Ser 1.21 (*)    GFR, Estimated 51 (*)    All other components within normal limits  CBC - Abnormal; Notable for the following components:   WBC 19.2 (*)    MCV 79.8 (*)    MCH 24.9 (*)    All other components within normal limits  CBG MONITORING, ED - Abnormal; Notable for the following components:   Glucose-Capillary 348 (*)    All other components within normal limits  RESP PANEL BY RT-PCR (RSV, FLU A&B, COVID)  RVPGX2  CULTURE, BLOOD (ROUTINE X 2)  CULTURE, BLOOD (ROUTINE X 2)  URINE CULTURE  APTT  PROTIME-INR  URINALYSIS, ROUTINE W REFLEX MICROSCOPIC  LACTIC ACID, PLASMA  LACTIC ACID, PLASMA  PROCALCITONIN  HEPATIC FUNCTION PANEL  CBG MONITORING, ED     EKG     RADIOLOGY I reviewed  and interpreted the chest x-ray which shows a right lower lobe pneumonia   PROCEDURES:  Critical Care performed: Yes, see critical care procedure note(s)  .1-3 Lead EKG Interpretation  Performed by: Rada Hay, MD Authorized by: Rada Hay, MD     Interpretation: normal     ECG rate  assessment: normal     Rhythm: sinus rhythm     Ectopy: none     Conduction: normal   .Critical Care  Performed by: Rada Hay, MD Authorized by: Rada Hay, MD   Critical care provider statement:    Critical care time (minutes):  30   Critical care was time spent personally by me on the following activities:  Development of treatment plan with patient or surrogate, discussions with consultants, evaluation of patient's response to treatment, examination of patient, ordering and review of laboratory studies, ordering and review of radiographic studies, ordering and performing treatments and interventions, pulse oximetry, re-evaluation of patient's condition and review of old charts   The patient is on the cardiac monitor to evaluate for evidence of arrhythmia and/or significant heart rate changes.   MEDICATIONS ORDERED IN ED: Medications  lactated ringers bolus 1,000 mL (has no administration in time range)  acetaminophen (TYLENOL) tablet 1,000 mg (has no administration in time range)     IMPRESSION / MDM / ASSESSMENT AND PLAN / ED COURSE  I reviewed the triage vital signs and the nursing notes.                              Patient's presentation is most consistent with acute presentation with potential threat to life or bodily function.  Differential diagnosis includes, but is not limited to, viral illness including COVID-19, influenza, sepsis secondary pneumonia, UTI, metabolic encephalopathy, dehydration, uremia  Patient is a 54-year-old female who presents with generalized weakness.  History is somewhat difficult to obtain as patient does not really offer much  information I have to ask her multiple questions about why she is here I gather that she has not felt well for several days and is now had difficulty ambulating because of feeling generally weak she has had some bodyaches had nausea and vomiting this morning without belly pain or urinary symptoms.  She denies cough congestion but felt she had a fever today.  Denies dyspnea.  She denies headache but does endorse body aches.  No sick contacts.  Patient's vital signs are notable for hypoxia she desats to 88% when taken off oxygen.  She is on 3 L currently.  Initially she is tachycardic.  Blood pressure on the high end temp is normal.  On exam she is somewhat fatigued looking but is nontoxic I hear crackles at the right base suspicious for pneumonia.  Abdominal exam is benign.   Chest x-ray confirms likely right lower lobe pneumonia.  Will send COVID and flu test as well.  She has a leukocytosis to 19 is hyperglycemic but not in DKA.  Given she is hypoxic she will need admission.  Will add on lactate blood cultures and cover with ceftriaxone and azithromycin.       FINAL CLINICAL IMPRESSION(S) / ED DIAGNOSES   Final diagnoses:  Acute respiratory failure with hypoxia ([place])  Community acquired pneumonia of right lower lobe of lung     Rx / DC Orders   ED Discharge Orders     None        Note:  This document was prepared using Dragon voice recognition software and may include unintentional dictation errors.   Rada Hay, MD 02/01/22 [phone]

## 2022-02-01 NOTE — Assessment & Plan Note (Signed)
-   Patient is on Praluent monthly injections

## 2022-02-01 NOTE — Assessment & Plan Note (Signed)
-   Presumed secondary to right lower lobe pneumonia - Continue oxygen supplementation to maintain SpO2 greater than 92%

## 2022-02-01 NOTE — ED Triage Notes (Addendum)
Pt from home via EMS post fall. Pt states she "didn't feel right" and states her legs came out from under her and her husband helped her to the floor where she could not get up. Pt AOX4, NAD noted. Pt denies LOC, denies hitting head, pt states she takes blood thinners- doesn't know what.  BGL 450 Pt denies CP, SHOB, dizziness, N/V/D. Pt c/o generalized pain when touched and weakness.

## 2022-02-01 NOTE — Assessment & Plan Note (Addendum)
-   Plavix 75 mg daily, aspirin 81 mg, Trental 400 mg p.o. twice daily resumed

## 2022-02-01 NOTE — Progress Notes (Signed)
PHARMACIST - PHYSICIAN COMMUNICATION  CONCERNING:  Enoxaparin (Lovenox) for DVT Prophylaxis    RECOMMENDATION: Patient was prescribed enoxaprin '40mg'$  q24 hours for VTE prophylaxis.   Filed Weights   02/01/22 1519  Weight: 88.9 kg (196 lb)    Body mass index is 30.7 kg/m.  Estimated Creatinine Clearance: 56.6 mL/min (A) (by C-G formula based on SCr of 1.21 mg/dL (H)).   Based on [place] patient is candidate for enoxaparin 0.'5mg'$ /kg TBW SQ every 24 hours based on BMI being >30.  DESCRIPTION: Pharmacy has adjusted enoxaparin dose per [facility] policy.  Patient is now receiving enoxaparin 45 mg every 24 hours    Lorin Picket, PharmD Clinical Pharmacist  02/01/2022 3:21 PM

## 2022-02-01 NOTE — Assessment & Plan Note (Addendum)
-   Right lower lobe pneumonia - Continue azithromycin 500 mg IV daily, ceftriaxone 2 g IV daily to complete 5-day course - Incentive spirometry, flutter valve every 2 hours while awake - Albuterol nebulizer every 6 hours as needed for shortness of breath and wheezing, 4 days ordered - Admit to inpatient, progressive cardiac

## 2022-02-01 NOTE — Assessment & Plan Note (Signed)
-   Presumed secondary to community-acquired pneumonia of the right lower lobe - Patient had elevated lactic acid 2.5, leukocytosis of 19.2, increased respiration rate, increased heart rate, organ involvement: Respiratory

## 2022-02-01 NOTE — ED Notes (Signed)
Pt placed on purewick 

## 2022-02-01 NOTE — Assessment & Plan Note (Signed)
-   Resumed home aspirin, Plavix

## 2022-02-01 NOTE — ED Notes (Signed)
Latic acid 2.5

## 2022-02-01 NOTE — Assessment & Plan Note (Signed)
-   Insulin SSI with at bedtime coverage ordered °

## 2022-02-01 NOTE — Assessment & Plan Note (Signed)
-   Appears to be at baseline

## 2022-02-01 NOTE — ED Notes (Signed)
Patient transported to X-ray 

## 2022-02-01 NOTE — Assessment & Plan Note (Signed)
-   Amlodipine 10 mg daily, losartan 25 mg daily resumed

## 2022-02-01 NOTE — Hospital Course (Addendum)
[name] is a 61 year old female with history of CVA, hypertension, hyperlipidemia, CAD status post CABG who presents emergency department for chief concerns of fall. Upon arriving the hospital, she was hypoxemic with sats at 80%, was placed on oxygen. Chest x-ray showed right basilar heterogenous pulmonary opacity, concerning for pneumonia.  Left basilar atelectasis. Patient was treated with Rocephin and Zithromax. Patient condition has improved, off oxygen, short of breath and cough has resolved.  Will discharge with oral antibiotics for total course of 5 days and follow-up with PCP in 1 week.

## 2022-02-01 NOTE — Assessment & Plan Note (Signed)
-   Resumed home aspirin, Plavix, losartan 25 mg daily, amlodipine 10 mg daily

## 2022-02-02 DIAGNOSIS — J9601 Acute respiratory failure with hypoxia: Secondary | ICD-10-CM | POA: Diagnosis not present

## 2022-02-02 DIAGNOSIS — N179 Acute kidney failure, unspecified: Secondary | ICD-10-CM | POA: Insufficient documentation

## 2022-02-02 DIAGNOSIS — E871 Hypo-osmolality and hyponatremia: Secondary | ICD-10-CM | POA: Insufficient documentation

## 2022-02-02 DIAGNOSIS — E1165 Type 2 diabetes mellitus with hyperglycemia: Secondary | ICD-10-CM

## 2022-02-02 LAB — BLOOD CULTURE ID PANEL (REFLEXED) - BCID2

## 2022-02-02 LAB — CBC
HCT: 35.9 % — ABNORMAL LOW (ref 36.0–46.0)
Hemoglobin: 11 g/dL — ABNORMAL LOW (ref 12.0–15.0)
MCH: 25.3 pg — ABNORMAL LOW (ref 26.0–34.0)
MCHC: 30.6 g/dL (ref 30.0–36.0)
MCV: 82.5 fL (ref 80.0–100.0)
Platelets: 306 10*3/uL (ref 150–400)
RBC: 4.35 MIL/uL (ref 3.87–5.11)
RDW: 14 % (ref 11.5–15.5)
WBC: 10.1 10*3/uL (ref 4.0–10.5)
nRBC: 0 % (ref 0.0–0.2)

## 2022-02-02 LAB — CBG MONITORING, ED
Glucose-Capillary: 242 mg/dL — ABNORMAL HIGH (ref 70–99)
Glucose-Capillary: 223 mg/dL — ABNORMAL HIGH (ref 70–99)
Glucose-Capillary: 208 mg/dL — ABNORMAL HIGH (ref 70–99)

## 2022-02-02 LAB — BASIC METABOLIC PANEL
Anion gap: 5 (ref 5–15)
BUN: 22 mg/dL — ABNORMAL HIGH (ref 6–20)
CO2: 27 mmol/L (ref 22–32)
Calcium: 8.8 mg/dL — ABNORMAL LOW (ref 8.9–10.3)
Chloride: 107 mmol/L (ref 98–111)
Creatinine, Ser: 0.85 mg/dL (ref 0.44–1.00)
GFR, Estimated: >60 mL/min (ref >60)
Glucose, Bld: 270 mg/dL — ABNORMAL HIGH (ref 70–99)
Potassium: 3.8 mmol/L (ref 3.5–5.1)
Sodium: 139 mmol/L (ref 135–145)

## 2022-02-02 LAB — GLUCOSE, CAPILLARY
Glucose-Capillary: 277 mg/dL — ABNORMAL HIGH (ref 70–99)
Glucose-Capillary: 181 mg/dL — ABNORMAL HIGH (ref 70–99)

## 2022-02-02 LAB — PROCALCITONIN: Procalcitonin: 1.86 ng/mL

## 2022-02-02 MED ORDER — METOPROLOL SUCCINATE ER 50 MG PO TB24
50.0000 mg | ORAL_TABLET | Freq: Every day | ORAL | Status: DC
  Administered 2022-02-02 – 2022-02-04 (×3): 50 mg via ORAL
  Filled 2022-02-02 (×3): qty 1

## 2022-02-02 MED ORDER — INSULIN ASPART 100 UNIT/ML IJ SOLN
5.0000 [IU] | Freq: Three times a day (TID) | INTRAMUSCULAR | Status: DC
  Administered 2022-02-02 – 2022-02-04 (×5): 5 [IU] via SUBCUTANEOUS
  Filled 2022-02-02 (×5): qty 1

## 2022-02-02 MED ORDER — INSULIN GLARGINE-YFGN 100 UNIT/ML ~~LOC~~ SOLN
40.0000 [IU] | Freq: Every day | SUBCUTANEOUS | Status: DC
  Administered 2022-02-02 – 2022-02-03 (×2): 40 [IU] via SUBCUTANEOUS
  Filled 2022-02-02 (×4): qty 0.4

## 2022-02-02 MED ORDER — INFLUENZA VAC SPLIT QUAD 0.5 ML IM SUSY: 0.5000 mL | PREFILLED_SYRINGE | INTRAMUSCULAR | Status: DC | PRN

## 2022-02-02 MED ORDER — HYDRALAZINE HCL 20 MG/ML IJ SOLN
5.0000 mg | Freq: Once | INTRAMUSCULAR | Status: AC
  Administered 2022-02-02: 5 mg via INTRAVENOUS
  Filled 2022-02-02: qty 1

## 2022-02-02 NOTE — ED Notes (Signed)
This RN assisted to the bathroom where she had bowel movement. Assisted back to her assigned room and connected back to monitor.

## 2022-02-02 NOTE — Progress Notes (Signed)
  Progress Note   Patient: Morgan Daniels DPO:[id number] DOB: 1960-05-16 DOA: 02/01/2022     1 DOS: the patient was seen and examined on 02/02/2022   Brief hospital course: Ms. Westlyn Glaza is a 61 year old female with history of CVA, hypertension, hyperlipidemia, CAD status post CABG who presents emergency department for chief concerns of fall. Upon arriving the hospital, she was hypoxemic with sats at 80%, was placed on oxygen. Chest x-ray showed right basilar heterogenous pulmonary opacity, concerning for pneumonia.  Left basilar atelectasis. Patient was treated with Rocephin and Zithromax.  Assessment and Plan: Acute hypoxemic respiratory failure secondary to pneumonia. Right lower lobe pneumonia. Sepsis ruled out. Patient had documented hypoxemia with shortness of breath, consistent with acute hypoxemic respite failure.  Chest x-ray showed right lower lobe pneumonia. Patient denies any choking or dysphagia.  Most likely this is a community-acquired pneumonia. Culture sent out, continue antibiotics with Rocephin and Zithromax.  Uncontrolled type 2 diabetes with hyperglycemia. Start insulin glargine, scheduled NovoLog, continue sliding scale insulin.  Essential hypertension. Continue amlodipine and losartan, restarted metoprolol.  Acute kidney injury. Stage IIIa CKD ruled out. Patient renal function has normalized since admission.  Not consistent with CKD.  History of stroke. Obtain PT/OT.  Peripheral artery disease. Continue aspirin Plavix.     Subjective:  Patient feels better today, still short of breath with exertion.  Cough, nonproductive.  Denies any choking or dysphagia.  Physical Exam: Vitals:   02/02/22 1000 02/02/22 1019 02/02/22 1100 02/02/22 1200  BP: (!) 145/89 (!) 176/94 (!) 168/92 (!) 157/92  Pulse: (!) 110 (!) 104 94 93  Resp: '16 16 16 15  '$ Temp:  98.4 F (36.9 C)    TempSrc:  Oral    SpO2: 92% 96% 96% 97%  Weight:      Height:       General exam:  Appears calm and comfortable  Respiratory system: Clear to auscultation. Respiratory effort normal. Cardiovascular system: S1 & S2 heard, RRR. No JVD, murmurs, rubs, gallops or clicks. No pedal edema. Gastrointestinal system: Abdomen is nondistended, soft and nontender. No organomegaly or masses felt. Normal bowel sounds heard. Central nervous system: Alert and oriented. No focal neurological deficits. Extremities: Symmetric 5 x 5 power. Skin: No rashes, lesions or ulcers Psychiatry: Judgement and insight appear normal. Mood & affect appropriate. ' Data Reviewed:  Chest x-ray lab results reviewed.  Family Communication: None  Disposition: Status is: Inpatient Remains inpatient appropriate because: Severity of disease, IV treatment.  Planned Discharge Destination:  pending    Time spent: 35 minutes  Author: Sharen Hones, MD 02/02/2022 1:01 PM  For on call review [email or web address].

## 2022-02-02 NOTE — Progress Notes (Signed)
       CROSS COVER NOTE  NAME: Morgan Daniels MRN: [id number] DOB : 09-15-1960 ATTENDING PHYSICIAN: Cox, Amy N, DO    Date of Service   02/02/2022   HPI/Events of Note   Notified of elevated BP--> 189/87. M(r)s Morgan Daniels's BP has been elevated throughout the day and trending up this afternoon.  Interventions   Assessment/Plan:  Hydralazine 5 mg IV       To reach the provider On-Call:   7AM- 7PM see care teams to locate the attending and reach out to them via [email or web address]. 7PM-7AM contact night-coverage If you still have difficulty reaching the appropriate provider, please page the [facility] (Director on Call) for [facility] on amion for assistance  This document was prepared using Set designer software and may include unintentional dictation errors.  Neomia Glass DNP, MBA, FNP-BC Nurse Practitioner [facility] Pager [phone]

## 2022-02-02 NOTE — ED Notes (Signed)
Notified NP Randol Kern of bladder scan volume of 999, new orders placed for an in and out cath

## 2022-02-02 NOTE — Progress Notes (Signed)
PHARMACY - PHYSICIAN COMMUNICATION CRITICAL VALUE ALERT - BLOOD CULTURE IDENTIFICATION (BCID)  Morgan Daniels is an 61 y.o. female who presented to [facility] on 02/01/2022 with a chief complaint of fall.  Patient found to be hypoxic and chest X-ray concerning for pneumonia.   Assessment: 1/4 bottles (aerobic) growing GPCs.  BCID positive for Staphylococcus epidermidis with no resistance genes detected.  Name of physician (or Provider) Contacted: Dr. Sharen Hones  Current antibiotics: ceftriaxone and azithromycin  Changes to prescribed antibiotics recommended:  Provider wishes to continued with current antibiotics.  Results for orders placed or performed during the hospital encounter of 02/01/22  Blood Culture ID Panel (Reflexed) (Collected: 02/01/2022  2:11 PM)  Result Value Ref Range   Enterococcus faecalis NOT DETECTED NOT DETECTED   Enterococcus Faecium NOT DETECTED NOT DETECTED   Listeria monocytogenes NOT DETECTED NOT DETECTED   Staphylococcus species DETECTED (A) NOT DETECTED   Staphylococcus aureus (BCID) NOT DETECTED NOT DETECTED   Staphylococcus epidermidis DETECTED (A) NOT DETECTED   Staphylococcus lugdunensis NOT DETECTED NOT DETECTED   Streptococcus species NOT DETECTED NOT DETECTED   Streptococcus agalactiae NOT DETECTED NOT DETECTED   Streptococcus pneumoniae NOT DETECTED NOT DETECTED   Streptococcus pyogenes NOT DETECTED NOT DETECTED   A.calcoaceticus-baumannii NOT DETECTED NOT DETECTED   Bacteroides fragilis NOT DETECTED NOT DETECTED   Enterobacterales NOT DETECTED NOT DETECTED   Enterobacter cloacae complex NOT DETECTED NOT DETECTED   Escherichia coli NOT DETECTED NOT DETECTED   Klebsiella aerogenes NOT DETECTED NOT DETECTED   Klebsiella oxytoca NOT DETECTED NOT DETECTED   Klebsiella pneumoniae NOT DETECTED NOT DETECTED   Proteus species NOT DETECTED NOT DETECTED   Salmonella species NOT DETECTED NOT DETECTED   Serratia marcescens NOT DETECTED NOT DETECTED    Haemophilus influenzae NOT DETECTED NOT DETECTED   Neisseria meningitidis NOT DETECTED NOT DETECTED   Pseudomonas aeruginosa NOT DETECTED NOT DETECTED   Stenotrophomonas maltophilia NOT DETECTED NOT DETECTED   Candida albicans NOT DETECTED NOT DETECTED   Candida auris NOT DETECTED NOT DETECTED   Candida glabrata NOT DETECTED NOT DETECTED   Candida krusei NOT DETECTED NOT DETECTED   Candida parapsilosis NOT DETECTED NOT DETECTED   Candida tropicalis NOT DETECTED NOT DETECTED   Cryptococcus neoformans/gattii NOT DETECTED NOT DETECTED   Methicillin resistance mecA/C NOT DETECTED NOT DETECTED    Lorin Picket, PharmD 02/02/2022  4:49 PM

## 2022-02-02 NOTE — Plan of Care (Signed)

## 2022-02-02 NOTE — Evaluation (Signed)
Physical Therapy Evaluation Patient Details Name: Morgan Daniels MRN: [id number] DOB: April 14, 1960 Today's Date: 02/02/2022  History of Present Illness  [name] is a 61 year old female with history of CVA, hypertension, hyperlipidemia, CAD status post CABG who presents emergency department for chief concerns of fall. Chest x-ray showed right basilar heterogenous pulmonary opacity, concerning for pneumonia.  Left basilar atelectasis.  Patient was treated with Rocephin and Zithromax.  Clinical Impression  Patient received in bed in ED. Agrees to PT session. Husband present initially then left. She is mod I for bed mobility and transfers with supervision. Ambulated min guard 10 feet in room holding to IV pole. Patient reports R foot feels swollen (does not appear swollen). She will continue to benefit from skilled PT while here to improve functional independence, safety and strength.        Recommendations for follow up therapy are one component of a multi-disciplinary discharge planning process, led by the attending physician.  Recommendations may be updated based on patient status, additional functional criteria and insurance authorization.  Follow Up Recommendations Home health PT      Assistance Recommended at Discharge Intermittent Supervision/Assistance  Patient can return home with the following  Assistance with cooking/housework;Help with stairs or ramp for entrance;Assist for transportation    Equipment Recommendations None recommended by PT  Recommendations for Other Services       Functional Status Assessment Patient has had a recent decline in their functional status and demonstrates the ability to make significant improvements in function in a reasonable and predictable amount of time.     Precautions / Restrictions Precautions Precautions: Fall Restrictions Weight Bearing Restrictions: No      Mobility  Bed Mobility Overal bed mobility: Modified Independent                   Transfers Overall transfer level: Needs assistance Equipment used: None Transfers: Sit to/from Stand Sit to Stand: Supervision, Min guard                Ambulation/Gait Ambulation/Gait assistance: Min guard Gait Distance (Feet): 10 Feet Assistive device: IV Pole Gait Pattern/deviations: Step-to pattern, Decreased step length - right, Decreased step length - left, Decreased stride length Gait velocity: decr     General Gait Details: reports right foot feeling "funny" and Swollen when walking.  Stairs            Wheelchair Mobility    Modified Rankin (Stroke Patients Only)       Balance Overall balance assessment: Modified Independent, Needs assistance Sitting-balance support: Feet supported Sitting balance-Leahy Scale: Good     Standing balance support: Bilateral upper extremity supported, During functional activity, Reliant on assistive device for balance Standing balance-Leahy Scale: Fair Standing balance comment: benefits from B UE support                             Pertinent Vitals/Pain Pain Assessment Pain Assessment: No/denies pain    Home Living Family/patient expects to be discharged to:: Private residence Living Arrangements: Spouse/significant other Available Help at Discharge: Family;Available PRN/intermittently Type of Home: House Home Access: Stairs to enter Entrance Stairs-Rails: Right Entrance Stairs-Number of Steps: 3-4   Home Layout: One level Home Equipment: Conservation officer, nature (2 wheels);Cane - single point;Shower seat      Prior Function Prior Level of Function : Independent/Modified Independent;Driving             Mobility Comments: mod I prior  to admission ADLs Comments: Independent prior to admission     Hand Dominance   Dominant Hand: Right    Extremity/Trunk Assessment   Upper Extremity Assessment Upper Extremity Assessment: Overall WFL for tasks assessed    Lower Extremity  Assessment Lower Extremity Assessment: Overall WFL for tasks assessed;RLE deficits/detail RLE Deficits / Details: reports her right foot feels swollen. Does not look swollen. Reports feeling funny with ambulation.    Cervical / Trunk Assessment Cervical / Trunk Assessment: Normal  Communication   Communication: No difficulties  Cognition Arousal/Alertness: Awake/alert Behavior During Therapy: WFL for tasks assessed/performed Overall Cognitive Status: Within Functional Limits for tasks assessed                                          General Comments      Exercises     Assessment/Plan    PT Assessment Patient needs continued PT services  PT Problem List Decreased strength;Decreased mobility;Decreased activity tolerance;Decreased balance       PT Treatment Interventions DME instruction;Therapeutic activities;Gait training;Therapeutic exercise;Stair training;Patient/family education;Functional mobility training    PT Goals (Current goals can be found in the Care Plan section)  Acute Rehab PT Goals Patient Stated Goal: to return home PT Goal Formulation: With patient Time For Goal Achievement: 02/16/22 Potential to Achieve Goals: Good    Frequency Min 2X/week     Co-evaluation               AM-PAC PT "6 Clicks" Mobility  Outcome Measure Help needed turning from your back to your side while in a flat bed without using bedrails?: None Help needed moving from lying on your back to sitting on the side of a flat bed without using bedrails?: None Help needed moving to and from a bed to a chair (including a wheelchair)?: A Little Help needed standing up from a chair using your arms (e.g., wheelchair or bedside chair)?: A Little Help needed to walk in hospital room?: A Little Help needed climbing 3-5 steps with a railing? : A Lot 6 Click Score: 19    End of Session Equipment Utilized During Treatment: Gait belt Activity Tolerance: Patient tolerated  treatment well Patient left: in bed;with call bell/phone within reach;with bed alarm set Nurse Communication: Mobility status PT Visit Diagnosis: Unsteadiness on feet (R26.81);Other abnormalities of gait and mobility (R26.89);Difficulty in walking, not elsewhere classified (R26.2);History of falling (Z91.81)    Time: 7169-6789 PT Time Calculation (min) (ACUTE ONLY): 16 min   Charges:   PT Evaluation $PT Eval Moderate Complexity: 1 Mod          Thurl Boen, PT, GCS 02/02/22,3:20 PM

## 2022-02-03 ENCOUNTER — Ambulatory Visit: Payer: Medicare Other | Admitting: *Deleted

## 2022-02-03 DIAGNOSIS — J9601 Acute respiratory failure with hypoxia: Secondary | ICD-10-CM | POA: Diagnosis not present

## 2022-02-03 DIAGNOSIS — E669 Obesity, unspecified: Secondary | ICD-10-CM | POA: Insufficient documentation

## 2022-02-03 DIAGNOSIS — J189 Pneumonia, unspecified organism: Secondary | ICD-10-CM | POA: Diagnosis not present

## 2022-02-03 DIAGNOSIS — E1165 Type 2 diabetes mellitus with hyperglycemia: Secondary | ICD-10-CM | POA: Diagnosis not present

## 2022-02-03 LAB — CBC
HCT: 36.1 % (ref 36.0–46.0)
Hemoglobin: 11.3 g/dL — ABNORMAL LOW (ref 12.0–15.0)
MCH: 25.2 pg — ABNORMAL LOW (ref 26.0–34.0)
MCHC: 31.3 g/dL (ref 30.0–36.0)
MCV: 80.4 fL (ref 80.0–100.0)
Platelets: 337 10*3/uL (ref 150–400)
RBC: 4.49 MIL/uL (ref 3.87–5.11)
RDW: 13.9 % (ref 11.5–15.5)
WBC: 8.8 10*3/uL (ref 4.0–10.5)
nRBC: 0 % (ref 0.0–0.2)

## 2022-02-03 LAB — GLUCOSE, CAPILLARY
Glucose-Capillary: 129 mg/dL — ABNORMAL HIGH (ref 70–99)
Glucose-Capillary: 204 mg/dL — ABNORMAL HIGH (ref 70–99)
Glucose-Capillary: 257 mg/dL — ABNORMAL HIGH (ref 70–99)
Glucose-Capillary: 263 mg/dL — ABNORMAL HIGH (ref 70–99)

## 2022-02-03 LAB — MAGNESIUM: Magnesium: 2.2 mg/dL (ref 1.7–2.4)

## 2022-02-03 LAB — BASIC METABOLIC PANEL
Anion gap: 8 (ref 5–15)
BUN: 23 mg/dL — ABNORMAL HIGH (ref 6–20)
CO2: 26 mmol/L (ref 22–32)
Calcium: 9.2 mg/dL (ref 8.9–10.3)
Chloride: 106 mmol/L (ref 98–111)
Creatinine, Ser: 1.23 mg/dL — ABNORMAL HIGH (ref 0.44–1.00)
GFR, Estimated: 50 mL/min — ABNORMAL LOW (ref >60)
Glucose, Bld: 154 mg/dL — ABNORMAL HIGH (ref 70–99)
Potassium: 3.6 mmol/L (ref 3.5–5.1)
Sodium: 140 mmol/L (ref 135–145)

## 2022-02-03 MED ORDER — ALUM & MAG HYDROXIDE-SIMETH 200-200-20 MG/5ML PO SUSP
30.0000 mL | ORAL | Status: DC | PRN
  Administered 2022-02-03: 30 mL via ORAL
  Filled 2022-02-03: qty 30

## 2022-02-03 NOTE — Progress Notes (Signed)
Physical Therapy Treatment Patient Details Name: Morgan Daniels MRN: [id number] DOB: 10/20/1960 Today's Date: 02/03/2022   History of Present Illness Morgan Daniels is a 61 year old female with history of CVA, hypertension, hyperlipidemia, CAD status post CABG who presents emergency department for chief concerns of fall. Chest x-ray showed right basilar heterogenous pulmonary opacity, concerning for pneumonia.  Left basilar atelectasis.  Patient was treated with Rocephin and Zithromax.    PT Comments    Pt received upright in bed agreeable to PT session. Pt remaining on RA with SPO2 > 90% at rest. Pt progressing in gait abilities ambulating > 80' and asc/desc steps with alternating pattern and single rail use safely. Mild unsteadiness appreciated in gait with intermittent, light UE support needed on objects but also able to correct independently if no object to touch as such as in the hallway. Pt returning to supine in bed with SPO2 at 92% post ambulation. Encouraged to accept [facility] PT due to unsteadiness  with gait with pt agreeable. RN updated on sats. All needs in reach.    Recommendations for follow up therapy are one component of a multi-disciplinary discharge planning process, led by the attending physician.  Recommendations may be updated based on patient status, additional functional criteria and insurance authorization.  Follow Up Recommendations  Home health PT     Assistance Recommended at Discharge Intermittent Supervision/Assistance  Patient can return home with the following Assistance with cooking/housework;Help with stairs or ramp for entrance;Assist for transportation   Equipment Recommendations  None recommended by PT    Recommendations for Other Services       Precautions / Restrictions Precautions Precautions: Fall Restrictions Weight Bearing Restrictions: No     Mobility  Bed Mobility Overal bed mobility: Modified Independent               Patient  Response: Cooperative  Transfers Overall transfer level: Needs assistance Equipment used: None Transfers: Sit to/from Stand Sit to Stand: Supervision                Ambulation/Gait Ambulation/Gait assistance: Supervision Gait Distance (Feet): 88 Feet Assistive device: None Gait Pattern/deviations: Step-to pattern, Decreased step length - right, Decreased step length - left, Decreased stride length       General Gait Details: mild lateral sway R/L during gait but corrects by her self.   Stairs Stairs: Yes Stairs assistance: Min guard Stair Management: One rail Right, Alternating pattern, Forwards Number of Stairs: 6 General stair comments: asc/desc 6 steps alteranting pattern and good stability   Wheelchair Mobility    Modified Rankin (Stroke Patients Only)       Balance Overall balance assessment: Modified Independent, Needs assistance Sitting-balance support: Feet supported Sitting balance-Leahy Scale: Good       Standing balance-Leahy Scale: Fair Standing balance comment: no AD required, mild unsteadiness with light touching support intermittently.                            Cognition Arousal/Alertness: Awake/alert Behavior During Therapy: WFL for tasks assessed/performed, Flat affect Overall Cognitive Status: Within Functional Limits for tasks assessed                                          Exercises      General Comments General comments (skin integrity, edema, etc.): SPO2 92% on RA post mobility.  Pertinent Vitals/Pain Pain Assessment Pain Assessment: No/denies pain    Home Living Family/patient expects to be discharged to:: Private residence Living Arrangements: Spouse/significant other Available Help at Discharge: Family;Available PRN/intermittently Type of Home: House Home Access: Stairs to enter Entrance Stairs-Rails: Right Entrance Stairs-Number of Steps: 3-4   Home Layout: One level Home  Equipment: Conservation officer, nature (2 wheels);Cane - single point;Shower seat      Prior Function            PT Goals (current goals can now be found in the care plan section) Acute Rehab PT Goals Patient Stated Goal: to return home PT Goal Formulation: With patient Time For Goal Achievement: 02/16/22 Potential to Achieve Goals: Good Progress towards PT goals: Progressing toward goals    Frequency    Min 2X/week      PT Plan Current plan remains appropriate    Co-evaluation              AM-PAC PT "6 Clicks" Mobility   Outcome Measure  Help needed turning from your back to your side while in a flat bed without using bedrails?: None Help needed moving from lying on your back to sitting on the side of a flat bed without using bedrails?: None Help needed moving to and from a bed to a chair (including a wheelchair)?: A Little Help needed standing up from a chair using your arms (e.g., wheelchair or bedside chair)?: A Little Help needed to walk in hospital room?: A Little Help needed climbing 3-5 steps with a railing? : A Little 6 Click Score: 20    End of Session   Activity Tolerance: Patient tolerated treatment well Patient left: in bed;with call bell/phone within reach;with bed alarm set Nurse Communication: Mobility status PT Visit Diagnosis: Unsteadiness on feet (R26.81);Other abnormalities of gait and mobility (R26.89);Difficulty in walking, not elsewhere classified (R26.2);History of falling (Z91.81)     Time: 0347-4259 PT Time Calculation (min) (ACUTE ONLY): 10 min  Charges:  $Gait Training: 8-22 mins                     Salem Caster. Fairly IV, PT, DPT Physical Therapist- [facility]  02/03/2022, 11:54 AM

## 2022-02-03 NOTE — TOC Initial Note (Signed)
Transition of Care [facility]) - Initial/Assessment Note    Patient Details  Name: Morgan Daniels MRN: [id number] Date of Birth: 01-Apr-1960  Transition of Care [facility]) CM/SW Contact:    Tiburcio Bash, LCSW Phone Number: 02/03/2022, 11:40 AM  Clinical Narrative:                  CSW spoke with patient regarding [place] recommendations, is agreeable with no preference of agency. Patient is set up with Malachy Mood with [facility] for [facility] PT.   Patient reports no DME needs, states she has a walker at home. Reports she continues to see Jonetta Osgood as PCP, NP. [facility].   No further dc needs identified at this time.  Expected Discharge Plan: [place] Barriers to Discharge: Continued Medical Work up   Patient Goals and CMS Choice Patient states their goals for this hospitalization and ongoing recovery are:: to go home CMS Medicare.gov Compare Post Acute Care list provided to:: Patient Choice offered to / list presented to : Patient  Expected Discharge Plan and Services Expected Discharge Plan: [place]       Living arrangements for the past 2 months: Single Family Home                           HH Arranged: PT HH Agency: [place] Date [place]: 02/03/22 Time HH Agency Contacted: 56 Representative spoke with at [place]: Malachy Mood  Prior Living Arrangements/Services Living arrangements for the past 2 months: [place] with:: Self                   Activities of Daily Living Home Assistive Devices/Equipment: None ADL Screening (condition at time of admission) Patient's cognitive ability adequate to safely complete daily activities?: Yes Is the patient deaf or have difficulty hearing?: No Does the patient have difficulty seeing, even when wearing glasses/contacts?: No Does the patient have difficulty concentrating, remembering, or making decisions?:  No Patient able to express need for assistance with ADLs?: Yes Does the patient have difficulty dressing or bathing?: No Independently performs ADLs?: Yes (appropriate for developmental age) Does the patient have difficulty walking or climbing stairs?: Yes Weakness of Legs: Both Weakness of Arms/Hands: None  Permission Sought/Granted                  Emotional Assessment       Orientation: : Oriented to Place, Oriented to Self, Oriented to  Time, Oriented to Situation Alcohol / Substance Use: Not Applicable Psych Involvement: No (comment)  Admission diagnosis:  Acute respiratory failure with hypoxia ([place]) [J96.01] Severe sepsis with acute organ dysfunction ([place]) [A41.9, R65.20] Community acquired pneumonia of right lower lobe of lung [J18.9] Patient Active Problem List   Diagnosis Date Noted   AKI (acute kidney injury) ([place]) 02/02/2022   Hyponatremia 02/02/2022   Non healing left heel wound    Stroke ([place]) 09/18/2021   Coronary artery disease 09/18/2021   Dizziness    PAD (peripheral artery disease) ([place]) 02/26/2021   Debility 01/25/2021   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   Encephalopathy acute    CAP (community acquired pneumonia)    Acute respiratory failure with hypoxia ([place]) 01/13/2021   NSTEMI (non-ST elevated myocardial infarction) ([place]) 01/13/2021   Hypercholesteremia 01/13/2021   Tobacco abuse 01/13/2021   Cardiogenic shock ([place]) 01/13/2021   Encounter for  screening mammogram for malignant neoplasm of breast 01/03/2019   Polyp of sigmoid colon    Irritable bowel syndrome with diarrhea 12/30/2017   Uncontrolled type 2 diabetes mellitus with hyperglycemia ([place]) 09/29/2017   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   Depression, major, single episode, moderate ([place]) 09/29/2017   Encounter for general adult medical examination with abnormal findings 06/11/2017   Type 2 diabetes mellitus with hyperglycemia, with long-term current use of insulin ([place]) 06/11/2017    Vitamin D deficiency 06/11/2017   Mixed hyperlipidemia 06/11/2017   Acquired hypothyroidism 06/11/2017   Essential hypertension 03/02/2017   Impingement syndrome of shoulder region 01/24/2016   Carotid stenosis 08/09/2013   Carotid artery stenosis with cerebral infarction [facility]) 08/03/2013   Aftercare following surgery of the circulatory system, NEC 12/08/2012   Occlusion and stenosis of carotid artery without mention of cerebral infarction 04/28/2012   HYPERCHOLESTEROLEMIA 06/01/2006   PANIC ATTACK 06/01/2006   TOBACCO ABUSE 06/01/2006   ALLERGIC RHINITIS 06/01/2006   GERD 06/01/2006   DIVERTICULAR DISEASE 06/01/2006   MIGRAINES, HX OF 06/01/2006   PCP:  Jonetta Osgood, NP Pharm[facility]   [id number] [place] [id number]: [phone]: [phone]  [id number] [place] [id number]: [phone]: [phone]   Social Determinants of Health (SDOH) Interventions    Readmission Risk Interventions     No data to display

## 2022-02-03 NOTE — Evaluation (Signed)
Occupational Therapy Evaluation Patient Details Name: Morgan Daniels MRN: [id number] DOB: 08-04-1960 Today's Date: 02/03/2022   History of Present Illness [name] is a 61 year old female with history of CVA, hypertension, hyperlipidemia, CAD status post CABG who presents emergency department for chief concerns of fall. Chest x-ray showed right basilar heterogenous pulmonary opacity, concerning for pneumonia.  Left basilar atelectasis.  Patient was treated with Rocephin and Zithromax.   Clinical Impression   Patient presenting with decreased Ind in self care,balance, functional mobility/transfers, endurance, and safety awareness. Patient reports being mod I at baseline with use of RW for functional mobility. Pt lives at home with husband who works during the day. Upon entering the room, pt seated on BSC having BM and stands with supervision for hygiene. Pt then ambulates to sink, while on RA, for hand hygiene and oral care at supervision level with RW. Pt's O2 saturation remains at or above 93% and RN notified and agreed pt could remain on RA in room. Pt returning to bed at end of session secondary to fatigue. All needs within reach and bed alarm activated. Patient will benefit from acute OT to increase overall independence in the areas of ADLs, functional mobility, and safety awareness in order to safely discharge to next venue of care.      Recommendations for follow up therapy are one component of a multi-disciplinary discharge planning process, led by the attending physician.  Recommendations may be updated based on patient status, additional functional criteria and insurance authorization.   Follow Up Recommendations  Home health OT     Assistance Recommended at Discharge Intermittent Supervision/Assistance  Patient can return home with the following A little help with walking and/or transfers;A little help with bathing/dressing/bathroom;Help with stairs or ramp for  entrance;Assistance with cooking/housework    Functional Status Assessment  Patient has had a recent decline in their functional status and demonstrates the ability to make significant improvements in function in a reasonable and predictable amount of time.  Equipment Recommendations  None recommended by OT       Precautions / Restrictions Precautions Precautions: Fall      Mobility Bed Mobility Overal bed mobility: Modified Independent                  Transfers Overall transfer level: Needs assistance Equipment used: Rolling walker (2 wheels) Transfers: Sit to/from Stand, Bed to chair/wheelchair/BSC Sit to Stand: Supervision     Step pivot transfers: Supervision            Balance Overall balance assessment: Modified Independent, Needs assistance Sitting-balance support: Feet supported Sitting balance-Leahy Scale: Good     Standing balance support: Bilateral upper extremity supported, During functional activity, Reliant on assistive device for balance Standing balance-Leahy Scale: Fair Standing balance comment: benefits from B UE support                           ADL either performed or assessed with clinical judgement   ADL Overall ADL's : Needs assistance/impaired     Grooming: Wash/dry hands;Wash/dry face;Oral care;Standing;Supervision/safety                   Toilet Transfer: Supervision/safety;BSC/3in1   Toileting- Water quality scientist and Hygiene: Supervision/safety;Sit to/from stand               Vision Baseline Vision/History: 1 Wears glasses Patient Visual Report: No change from baseline  Pertinent Vitals/Pain Pain Assessment Pain Assessment: No/denies pain     Hand Dominance Right   Extremity/Trunk Assessment Upper Extremity Assessment Upper Extremity Assessment: Overall WFL for tasks assessed   Lower Extremity Assessment Lower Extremity Assessment: Overall WFL for tasks assessed        Communication Communication Communication: No difficulties   Cognition Arousal/Alertness: Awake/alert Behavior During Therapy: WFL for tasks assessed/performed Overall Cognitive Status: Within Functional Limits for tasks assessed                                                  Home Living Family/patient expects to be discharged to:: Private residence Living Arrangements: Spouse/significant other Available Help at Discharge: Family;Available PRN/intermittently Type of Home: House Home Access: Stairs to enter [employer] of Steps: 3-4 Entrance Stairs-Rails: Right Home Layout: One level     Bathroom Shower/Tub: Occupational psychologist: Standard Bathroom Accessibility: Yes How Accessible: Accessible via wheelchair;Accessible via walker Home Equipment: [place] (2 wheels);Cane - single point;Shower seat          Prior Functioning/Environment Prior Level of Function : Independent/Modified Independent;Driving             Mobility Comments: mod I prior to admission ADLs Comments: Independent prior to admission        OT Problem List: Decreased strength;Decreased activity tolerance;Impaired balance (sitting and/or standing);Decreased safety awareness;Cardiopulmonary status limiting activity      OT Treatment/Interventions: Self-care/ADL training;Energy conservation;Balance training;Therapeutic exercise;DME and/or AE instruction;Therapeutic activities;Patient/family education    OT Goals(Current goals can be found in the care plan section) Acute Rehab OT Goals Patient Stated Goal: to go home OT Goal Formulation: With patient Time For Goal Achievement: 02/17/22 Potential to Achieve Goals: Good ADL Goals Pt Will Perform Grooming: with modified independence;standing Pt Will Perform Lower Body Dressing: with modified independence;sit to/from stand Pt Will Transfer to Toilet: with modified independence;ambulating Pt Will  Perform Toileting - Clothing Manipulation and hygiene: with modified independence;sit to/from stand  OT Frequency: Min 2X/week       AM-PAC OT "6 Clicks" Daily Activity     Outcome Measure Help from another person eating meals?: None Help from another person taking care of personal grooming?: None Help from another person toileting, which includes using toliet, bedpan, or urinal?: None Help from another person bathing (including washing, rinsing, drying)?: None Help from another person to put on and taking off regular upper body clothing?: None Help from another person to put on and taking off regular lower body clothing?: None 6 Click Score: 24   End of Session Equipment Utilized During Treatment: Rolling walker (2 wheels) Nurse Communication: Mobility status  Activity Tolerance: Patient tolerated treatment well Patient left: in bed;with call bell/phone within reach  OT Visit Diagnosis: Unsteadiness on feet (R26.81);Repeated falls (R29.6);Muscle weakness (generalized) (M62.81)                Time: 5631-4970 OT Time Calculation (min): 13 min Charges:  OT General Charges $OT Visit: 1 Visit OT Evaluation $OT Eval Low Complexity: 1 Low OT Treatments $Self Care/Home Management : 8-22 mins  Darleen Crocker, MS, OTR/L , CBIS ascom [phone]  02/03/22, 12:14 PM

## 2022-02-03 NOTE — Progress Notes (Addendum)
  Progress Note   Patient: Morgan Daniels [id number]:[id number] DOB: Dec 21, 1960 DOA: 02/01/2022     2 DOS: the patient was seen and examined on 02/03/2022   Brief hospital course: Ms. Tashiya Souders is a 61 year old female with history of CVA, hypertension, hyperlipidemia, CAD status post CABG who presents emergency department for chief concerns of fall. Upon arriving the hospital, she was hypoxemic with sats at 80%, was placed on oxygen. Chest x-ray showed right basilar heterogenous pulmonary opacity, concerning for pneumonia.  Left basilar atelectasis. Patient was treated with Rocephin and Zithromax.  Assessment and Plan: Acute hypoxemic respiratory failure secondary to pneumonia. Right lower lobe pneumonia. Sepsis ruled out. Patient had documented hypoxemia with shortness of breath, consistent with acute hypoxemic respite failure.  Chest x-ray showed right lower lobe pneumonia. Patient denies any choking or dysphagia.  Most likely this is a community-acquired pneumonia. Blood culture 1 bottle positive for staph epidermis, appears to be contaminant.  Continue Rocephin and Zithromax.   Uncontrolled type 2 diabetes with hyperglycemia. Continue current regimen.  Essential hypertension. Continue amlodipine and losartan, restarted metoprolol.   Acute kidney injury. Stage IIIa CKD ruled out. Patient renal function has normalized since admission.  Not consistent with CKD.   History of stroke. Obtain PT/OT.   Peripheral artery disease. Continue aspirin Plavix.   Obesity with BMI 30.71     Subjective:  Patient doing better today, but is still requiring 2 L oxygen.  Will wean off.  Cough, nonproductive.  Physical Exam: Vitals:   02/02/22 2315 02/03/22 0233 02/03/22 0553 02/03/22 0815  BP: 95/60 (!) 143/78 (!) 157/82 (!) 158/82  Pulse: 76 71 74 75  Resp: '18 16 17 20  '$ Temp: 97.8 F (36.6 C)  98.1 F (36.7 C) 98.6 F (37 C)  TempSrc: Oral  Oral Oral  SpO2: 94% 96% 93% 97%  Weight:       Height:       General exam: Appears calm and comfortable  Respiratory system: Clear to auscultation. Respiratory effort normal. Cardiovascular system: S1 & S2 heard, RRR. No JVD, murmurs, rubs, gallops or clicks. No pedal edema. Gastrointestinal system: Abdomen is nondistended, soft and nontender. No organomegaly or masses felt. Normal bowel sounds heard. Central nervous system: Alert and oriented. No focal neurological deficits. Extremities: Symmetric 5 x 5 power. Skin: No rashes, lesions or ulcers Psychiatry: Judgement and insight appear normal. Mood & affect appropriate.   Data Reviewed:  Lab results reviewed.  Family Communication: None  Disposition: Status is: Inpatient Remains inpatient appropriate because: Severity of Disease, IV antibiotics.  Planned Discharge Destination: Home with Home Health    Time spent: 35 minutes  Author: Sharen Hones, MD 02/03/2022 12:10 PM  For on call review [email or web address].

## 2022-02-03 NOTE — Plan of Care (Signed)

## 2022-02-04 DIAGNOSIS — N179 Acute kidney failure, unspecified: Secondary | ICD-10-CM

## 2022-02-04 DIAGNOSIS — J189 Pneumonia, unspecified organism: Secondary | ICD-10-CM | POA: Diagnosis not present

## 2022-02-04 DIAGNOSIS — E1165 Type 2 diabetes mellitus with hyperglycemia: Secondary | ICD-10-CM | POA: Diagnosis not present

## 2022-02-04 LAB — URINE CULTURE: Culture: 50000 — AB

## 2022-02-04 LAB — CREATININE, SERUM
Creatinine, Ser: 0.9 mg/dL (ref 0.44–1.00)
GFR, Estimated: >60 mL/min (ref >60)

## 2022-02-04 LAB — GLUCOSE, CAPILLARY
Glucose-Capillary: 211 mg/dL — ABNORMAL HIGH (ref 70–99)
Glucose-Capillary: 208 mg/dL — ABNORMAL HIGH (ref 70–99)

## 2022-02-04 LAB — CULTURE, BLOOD (ROUTINE X 2)

## 2022-02-04 MED ORDER — AZITHROMYCIN 250 MG PO TABS
500.0000 mg | ORAL_TABLET | Freq: Every day | ORAL | Status: DC
  Administered 2022-02-04: 500 mg via ORAL
  Filled 2022-02-04: qty 2

## 2022-02-04 MED ORDER — AZITHROMYCIN 250 MG PO TABS: 500.0000 mg | ORAL_TABLET | Freq: Once | ORAL | 0 refills | Status: AC

## 2022-02-04 MED ORDER — CEFDINIR 300 MG PO CAPS: 300.0000 mg | ORAL_CAPSULE | Freq: Two times a day (BID) | ORAL | 0 refills | Status: AC

## 2022-02-04 NOTE — TOC Transition Note (Signed)
Transition of Care [facility]) - CM/SW Discharge Note   Patient Details  Name: DELYLA SANDEEN MRN: [id number] Date of Birth: 1960/05/26  [facility]) CM/SW Contact:  Tiburcio Bash, LCSW Phone Number: 02/04/2022, 10:48 AM   Clinical Narrative:     Patient to dc home with [facility] services with Verda Cumins with [facility] informed of dc today.   No further dc needs at this time.  Final next level of care: [place] Barriers to Discharge: No Barriers Identified   Patient Goals and CMS Choice Patient states their goals for this hospitalization and ongoing recovery are:: to go home CMS Medicare.gov Compare Post Acute Care list provided to:: Patient Choice offered to / list presented to : Patient  Discharge Placement                    Patient and family notified of of transfer: 02/04/22  Discharge Plan and Services                          HH Arranged: PT, OT [facility] Agency: [place] Date [place]: 02/04/22 Time [place]: 1884 Representative spoke with at [place]: [place] Determinants of Health ([place]) Interventions     Readmission Risk Interventions     No data to display

## 2022-02-04 NOTE — Progress Notes (Signed)
Discharge instructions explained to pt/ verbalized an understanding/ iv and tele removed/ will transport off unit via wheelchair.  

## 2022-02-04 NOTE — Care Management Important Message (Signed)
Important Message  Patient Details  Name: Morgan Daniels MRN: [id number] Date of Birth: [birth year]   Medicare Important Message Given:  Yes     Dannette Barbara 02/04/2022, 11:47 AM

## 2022-02-04 NOTE — Discharge Summary (Signed)
Physician Discharge Summary   Patient: Morgan Daniels MRN: [id number] DOB: 1960/08/08  Admit date:     02/01/2022  Discharge date: 02/04/22  Discharge Physician: Sharen Hones   PCP: Jonetta Osgood, NP   Recommendations at discharge:   Follow-up with PCP in 1 week.  Discharge Diagnoses: Active Problems:   CAP (community acquired pneumonia)   Stroke [facility])   Essential hypertension   PAD (peripheral artery disease) ([place])   Coronary artery disease   TOBACCO ABUSE   GERD   Mixed hyperlipidemia   Uncontrolled type 2 diabetes mellitus with hyperglycemia (HCC)   Acute respiratory failure with hypoxia (HCC)   Hypercholesteremia   S/P CABG x 4   AKI (acute kidney injury) (HCC)   Hyponatremia   Obesity (BMI 30-39.9)  Resolved Problems:   * No resolved hospital problems. [facility] Course: Morgan Daniels is a 61 year old female with history of CVA, hypertension, hyperlipidemia, CAD status post CABG who presents emergency department for chief concerns of fall. Upon arriving the hospital, she was hypoxemic with sats at 80%, was placed on oxygen. Chest x-ray showed right basilar heterogenous pulmonary opacity, concerning for pneumonia.  Left basilar atelectasis. Patient was treated with Rocephin and Zithromax. Patient condition has improved, off oxygen, short of breath and cough has resolved.  Will discharge with oral antibiotics for total course of 5 days and follow-up with PCP in 1 week.  Assessment and Plan: Acute hypoxemic respiratory failure secondary to pneumonia. Right lower lobe pneumonia. Sepsis ruled out. Patient had documented hypoxemia with shortness of breath, consistent with acute hypoxemic respite failure.  Chest x-ray showed right lower lobe pneumonia. Patient denies any choking or dysphagia.  Most likely this is a community-acquired pneumonia. Blood culture 1 bottle positive for staph epidermis, appears to be contaminant.   Patient condition has improved, off  oxygen, respiratory symptom has improved.  Will continue oral antibiotic to complete a 5-day course.   Uncontrolled type 2 diabetes with hyperglycemia. Patient glucose still running high, will resume home regimen which has a higher dose of insulin.   Essential hypertension. Resume all home treatment.   Acute kidney injury. Stage IIIa CKD ruled out. Patient renal function has normalized since admission.  Not consistent with CKD.   History of stroke. Home health PT/OT.   Peripheral artery disease. Continue aspirin Plavix.   Obesity with BMI 30.71       Consultants: None Procedures performed: None  Disposition: Home health Diet recommendation:  Discharge Diet Orders (From admission, onward)     Start     Ordered   02/04/22 0000  Diet - low sodium heart healthy        02/04/22 0958           Cardiac diet DISCHARGE MEDICATION: Allergies as of 02/04/2022       Reactions   Simvastatin Diarrhea, Nausea And Vomiting   Morphine And Related Itching   Lactose Intolerance (gi) Diarrhea   bloating   Latex Rash   Rash when she wears gloves (Negative by test - per pt)        Medication List     TAKE these medications    amLODipine 10 MG tablet Commonly known as: NORVASC Take 1 tablet (10 mg total) by mouth daily.   aspirin 81 MG chewable tablet Chew 1 tablet (81 mg total) by mouth daily.   azithromycin 250 MG tablet Commonly known as: ZITHROMAX Take 2 tablets (500 mg total) by mouth once for 1 dose. Start  taking on: February 05, 2022   cefdinir 300 MG capsule Commonly known as: OMNICEF Take 1 capsule (300 mg total) by mouth 2 (two) times daily for 3 days.   clopidogrel 75 MG tablet Commonly known as: PLAVIX Take 75 mg by mouth daily.   collagenase 250 UNIT/GM ointment Commonly known as: SANTYL Apply topically daily.   dicyclomine 10 MG capsule Commonly known as: BENTYL TAKE 1 CAPSULE (10 MG TOTAL) BY MOUTH 3 (THREE) TIMES DAILY BEFORE MEALS.    Farxiga 10 MG Tabs tablet Generic drug: dapagliflozin propanediol TAKE 1 TABLET BY MOUTH DAILY BEFORE BREAKFAST.   FLUoxetine 40 MG capsule Commonly known as: PROZAC Take 2 capsules (80 mg total) by mouth daily.   FreeStyle Libre 2 Reader Kerrin Mo As directed   [employer] 2 Sensor Misc USE TO TEST BLOOD GLUCOSE 4 TIMES A DAY . DX E11.65   gabapentin 600 MG tablet Commonly known as: NEURONTIN Take 1 tablet (600 mg total) by mouth in the morning, at noon, in the evening, and at bedtime.   gemfibrozil 600 MG tablet Commonly known as: LOPID Take 1 tablet (600 mg total) by mouth 2 (two) times daily.   losartan 25 MG tablet Commonly known as: COZAAR Take 1 tablet (25 mg total) by mouth daily.   metoprolol succinate 50 MG 24 hr tablet Commonly known as: TOPROL-XL Take 1 tablet (50 mg total) by mouth daily.   mirtazapine 15 MG tablet Commonly known as: REMERON Take 1 tablet (15 mg total) by mouth at bedtime.   nitroGLYCERIN 0.2 mg/hr patch Commonly known as: NITRODUR - Dosed in mg/24 hr Place 1 patch (0.2 mg total) onto the skin daily.   NovoLOG FlexPen 100 UNIT/ML FlexPen Generic drug: insulin aspart Inject 20 Units into the skin 3 (three) times daily before meals. Needs appointment for further refills. What changed:  how much to take additional instructions   pentoxifylline 400 MG CR tablet Commonly known as: TRENTAL Take 400 mg by mouth 2 (two) times daily.   Praluent 150 MG/ML Soaj Generic drug: Alirocumab Inject 150 mg into the skin every 30 (thirty) days.   rosuvastatin 40 MG tablet Commonly known as: CRESTOR Take 1 tablet (40 mg total) by mouth daily.   tiZANidine 4 MG tablet Commonly known as: ZANAFLEX TAKE 1 TABLET(S) BY MOUTH TWICE A DAY AS NEEDED FOR MUSCLE PAIN AND SPASMS   traMADol 50 MG tablet Commonly known as: ULTRAM TAKE 1 TABLET BY MOUTH 3 (THREE) TIMES DAILY AS NEEDED FOR MODERATE PAIN OR SEVERE PAIN.   Tyler Aas FlexTouch 200 UNIT/ML  FlexTouch Pen Generic drug: insulin degludec INJECT 100 UNITS INTO THE SKIN AT BEDTIME. What changed: how much to take   UltiCare Micro Pen Needles 32G X 4 MM Misc Generic drug: Insulin Pen Needle TO USE WITH INSULIN DOSING THREE TIMES DAILY PRIOR TO MEALS AND AT BEDTIME FOR BASAL INSULIN.   Vascepa 1 g capsule Generic drug: icosapent Ethyl Take 1 g by mouth 2 (two) times daily.   Vitamin D-3 125 MCG (5000 UT) Tabs Take 5,000 Units by mouth daily.        Follow-up Information     Abernathy, Yetta Flock, NP Follow up in 1 week(s).   Specialty: Nurse Practitioner Contact information: [place] [address] [phone]                Discharge Exam: Filed Weights   02/01/22 1519 02/01/22 1633  Weight: 88.9 kg 88.9 kg   General exam: Appears calm  and comfortable  Respiratory system: Clear to auscultation. Respiratory effort normal. Cardiovascular system: S1 & S2 heard, RRR. No JVD, murmurs, rubs, gallops or clicks. No pedal edema. Gastrointestinal system: Abdomen is nondistended, soft and nontender. No organomegaly or masses felt. Normal bowel sounds heard. Central nervous system: Alert and oriented. No focal neurological deficits. Extremities: Symmetric 5 x 5 power. Skin: No rashes, lesions or ulcers Psychiatry: Judgement and insight appear normal. Mood & affect appropriate.    Condition at discharge: good  The results of significant diagnostics from this hospitalization (including imaging, microbiology, ancillary and laboratory) are listed below for reference.   Imaging Studies: CT HEAD WO CONTRAST (5MM)  Result Date: 02/01/2022 CLINICAL DATA:  61 year old female with altered mental status. EXAM: CT HEAD WITHOUT CONTRAST TECHNIQUE: Contiguous axial images were obtained from the base of the skull through the vertex without intravenous contrast. RADIATION DOSE REDUCTION: This exam was performed according to the departmental dose-optimization program  which includes automated exposure control, adjustment of the mA and/or kV according to patient size and/or use of iterative reconstruction technique. COMPARISON:  09/18/2021 head CT FINDINGS: Brain: No evidence of acute infarction, hemorrhage, hydrocephalus, extra-axial collection or mass lesion/mass effect. Atrophy, remote white matter infarcts and chronic small-vessel white matter ischemic changes again noted. Vascular: Carotid atherosclerotic calcifications are noted. Skull: Normal. Negative for fracture or focal lesion. Sinuses/Orbits: No acute finding. Other: None. IMPRESSION: 1. No evidence of acute intracranial abnormality. 2. Atrophy, remote white matter infarcts and chronic small-vessel white matter ischemic changes. Electronically Signed   By: Margarette Canada M.D.   On: 02/01/2022 15:13   DG Chest 2 View  Result Date: 02/01/2022 CLINICAL DATA:  Rule out pneumonia EXAM: CHEST - 2 VIEW COMPARISON:  Chest x-ray August 20, 2021 FINDINGS: The cardiomediastinal silhouette is unchanged in contour status post median sternotomy and CABG. Right basilar heterogeneous pulmonary opacity. Left basilar linear/bandlike opacity, likely atelectasis. No pleural effusion or pneumothorax. The visualized upper abdomen is unremarkable. No acute osseous abnormality. IMPRESSION: 1. Right basilar heterogeneous pulmonary opacity, concerning for pneumonia. 2. Left basilar atelectasis. Electronically Signed   By: Beryle Flock M.D.   On: 02/01/2022 13:52    Microbiology: Results for orders placed or performed during the hospital encounter of 02/01/22  Resp panel by RT-PCR (RSV, Flu A&B, Covid) Anterior Nasal Swab     Status: None   Collection Time: 02/01/22  2:11 PM   Specimen: Anterior Nasal Swab  Result Value Ref Range Status   SARS Coronavirus 2 by RT PCR NEGATIVE NEGATIVE Final    Comment: (NOTE) SARS-CoV-2 target nucleic acids are NOT DETECTED.  The SARS-CoV-2 RNA is generally detectable in upper  respiratory specimens during the acute phase of infection. The lowest concentration of SARS-CoV-2 viral copies this assay can detect is 138 copies/mL. A negative result does not preclude SARS-Cov-2 infection and should not be used as the sole basis for treatment or other patient management decisions. A negative result may occur with  improper specimen collection/handling, submission of specimen other than nasopharyngeal swab, presence of viral mutation(s) within the areas targeted by this assay, and inadequate number of viral copies(<138 copies/mL). A negative result must be combined with clinical observations, patient history, and epidemiological information. The expected result is Negative.  Fact Sheet for Patients:  EntrepreneurPulse.com.au  Fact Sheet for Healthcare Providers:  IncredibleEmployment.be  This test is no t yet approved or cleared by the [place] FDA and  has been authorized for detection and/or diagnosis of SARS-CoV-2 by FDA under  an Emergency Use Authorization (EUA). This EUA will remain  in effect (meaning this test can be used) for the duration of the COVID-19 declaration under Section 564(b)(1) of the Act, 21 U.S.C.section 360bbb-3(b)(1), unless the authorization is terminated  or revoked sooner.       Influenza A by PCR NEGATIVE NEGATIVE Final   Influenza B by PCR NEGATIVE NEGATIVE Final    Comment: (NOTE) The Xpert Xpress SARS-CoV-2/FLU/RSV plus assay is intended as an aid in the diagnosis of influenza from Nasopharyngeal swab specimens and should not be used as a sole basis for treatment. Nasal washings and aspirates are unacceptable for Xpert Xpress SARS-CoV-2/FLU/RSV testing.  Fact Sheet for Patients: EntrepreneurPulse.com.au  Fact Sheet for Healthcare Providers: IncredibleEmployment.be  This test is not yet approved or cleared by the [place] FDA and has been  authorized for detection and/or diagnosis of SARS-CoV-2 by FDA under an Emergency Use Authorization (EUA). This EUA will remain in effect (meaning this test can be used) for the duration of the COVID-19 declaration under Section 564(b)(1) of the Act, 21 U.S.C. section 360bbb-3(b)(1), unless the authorization is terminated or revoked.     Resp Syncytial Virus by PCR NEGATIVE NEGATIVE Final    Comment: (NOTE) Fact Sheet for Patients: EntrepreneurPulse.com.au  Fact Sheet for Healthcare Providers: IncredibleEmployment.be  This test is not yet approved or cleared by the [place] FDA and has been authorized for detection and/or diagnosis of SARS-CoV-2 by FDA under an Emergency Use Authorization (EUA). This EUA will remain in effect (meaning this test can be used) for the duration of the COVID-19 declaration under Section 564(b)(1) of the Act, 21 U.S.C. section 360bbb-3(b)(1), unless the authorization is terminated or revoked.  Performed at [facility], [address] [place], [place] [address]   Blood culture (routine x 2)     Status: None (Preliminary result)   Collection Time: 02/01/22  2:11 PM   Specimen: Right Antecubital; Blood  Result Value Ref Range Status   Specimen Description RIGHT ANTECUBITAL  Final   Special Requests   Final    BOTTLES DRAWN AEROBIC AND ANAEROBIC Blood Culture results may not be optimal due to an excessive volume of blood received in culture bottles   Culture   Final    NO GROWTH 3 DAYS Performed at [facility], [address] [place], [place] [address]    Report Status PENDING  Incomplete  Blood culture (routine x 2)     Status: Abnormal   Collection Time: 02/01/22  2:11 PM   Specimen: BLOOD  Result Value Ref Range Status   Specimen Description   Final    BLOOD BLOOD LEFT ARM Performed at [facility], [address] [place], [place] [address]    Special Requests   Final     BOTTLES DRAWN AEROBIC AND ANAEROBIC Blood Culture results may not be optimal due to an excessive volume of blood received in culture bottles Performed at [facility], [address] [place],  [address]    Culture  Setup Time   Final    GRAM POSITIVE COCCI AEROBIC BOTTLE ONLY CRITICAL RESULT CALLED TO, READ BACK BY AND VERIFIED WITH: TREY GREENWOOD 02/02/22 1643 AMK    Culture (A)  Final    STAPHYLOCOCCUS EPIDERMIDIS THE SIGNIFICANCE OF ISOLATING THIS ORGANISM FROM A SINGLE SET OF BLOOD CULTURES WHEN MULTIPLE SETS ARE DRAWN IS UNCERTAIN. PLEASE NOTIFY THE MICROBIOLOGY DEPARTMENT WITHIN ONE WEEK IF SPECIATION AND SENSITIVITIES ARE REQUIRED. Performed at [facility], [place] [address], [place], [place] [address]    Report Status 02/04/2022 FINAL  Final  Blood Culture ID Panel (Reflexed)     Status: Abnormal   Collection Time: 02/01/22  2:11 PM  Result Value Ref Range Status   Enterococcus faecalis NOT DETECTED NOT DETECTED Final   Enterococcus Faecium NOT DETECTED NOT DETECTED Final   Listeria monocytogenes NOT DETECTED NOT DETECTED Final   Staphylococcus species DETECTED (A) NOT DETECTED Final    Comment: CRITICAL RESULT CALLED TO, READ BACK BY AND VERIFIED WITH: TREY GREENWOOD 02/02/22 1643 AMK    Staphylococcus aureus (BCID) NOT DETECTED NOT DETECTED Final   Staphylococcus epidermidis DETECTED (A) NOT DETECTED Final    Comment: CRITICAL RESULT CALLED TO, READ BACK BY AND VERIFIED WITH: TREY GREENWOOD 02/02/22 1643 AMK    Staphylococcus lugdunensis NOT DETECTED NOT DETECTED Final   Streptococcus species NOT DETECTED NOT DETECTED Final   Streptococcus agalactiae NOT DETECTED NOT DETECTED Final   Streptococcus pneumoniae NOT DETECTED NOT DETECTED Final   Streptococcus pyogenes NOT DETECTED NOT DETECTED Final   A.calcoaceticus-baumannii NOT DETECTED NOT DETECTED Final   Bacteroides fragilis NOT DETECTED NOT DETECTED Final   Enterobacterales NOT DETECTED NOT  DETECTED Final   Enterobacter cloacae complex NOT DETECTED NOT DETECTED Final   Escherichia coli NOT DETECTED NOT DETECTED Final   Klebsiella aerogenes NOT DETECTED NOT DETECTED Final   Klebsiella oxytoca NOT DETECTED NOT DETECTED Final   Klebsiella pneumoniae NOT DETECTED NOT DETECTED Final   Proteus species NOT DETECTED NOT DETECTED Final   Salmonella species NOT DETECTED NOT DETECTED Final   Serratia marcescens NOT DETECTED NOT DETECTED Final   Haemophilus influenzae NOT DETECTED NOT DETECTED Final   Neisseria meningitidis NOT DETECTED NOT DETECTED Final   Pseudomonas aeruginosa NOT DETECTED NOT DETECTED Final   Stenotrophomonas maltophilia NOT DETECTED NOT DETECTED Final   Candida albicans NOT DETECTED NOT DETECTED Final   Candida auris NOT DETECTED NOT DETECTED Final   Candida glabrata NOT DETECTED NOT DETECTED Final   Candida krusei NOT DETECTED NOT DETECTED Final   Candida parapsilosis NOT DETECTED NOT DETECTED Final   Candida tropicalis NOT DETECTED NOT DETECTED Final   Cryptococcus neoformans/gattii NOT DETECTED NOT DETECTED Final   Methicillin resistance mecA/C NOT DETECTED NOT DETECTED Final    Comment: Performed at [facility], [address], [place], [place] [address]  Urine Culture     Status: Abnormal   Collection Time: 02/01/22  6:18 PM   Specimen: Urine, Clean Catch  Result Value Ref Range Status   Specimen Description   Final    URINE, CLEAN CATCH Performed at [facility], [address] [place], [place] [address]    Special Requests   Final    NONE Performed at [facility], [address] [place], [place] [address]    Culture 50,000 COLONIES/mL ESCHERICHIA COLI (A)  Final   Report Status 02/04/2022 FINAL  Final   Organism ID, Bacteria ESCHERICHIA COLI (A)  Final      Susceptibility   Escherichia coli - MIC*    AMPICILLIN <=2 SENSITIVE Sensitive     CEFAZOLIN <=4 SENSITIVE Sensitive     CEFEPIME <=0.12 SENSITIVE Sensitive      CEFTRIAXONE <=0.25 SENSITIVE Sensitive     CIPROFLOXACIN <=0.25 SENSITIVE Sensitive     GENTAMICIN <=1 SENSITIVE Sensitive     IMIPENEM <=0.25 SENSITIVE Sensitive     NITROFURANTOIN <=16 SENSITIVE Sensitive     TRIMETH/SULFA <=20 SENSITIVE Sensitive  AMPICILLIN/SULBACTAM <=2 SENSITIVE Sensitive     PIP/TAZO <=4 SENSITIVE Sensitive     * 50,000 COLONIES/mL ESCHERICHIA COLI    Labs: CBC: Recent Labs  Lab 02/01/22 1118 02/02/22 0452 02/03/22 0306  WBC 19.2* 10.1 8.8  HGB 12.6 11.0* 11.3*  HCT 40.4 35.9* 36.1  MCV 79.8* 82.5 80.4  PLT 356 306 098   Basic Metabolic Panel: Recent Labs  Lab 02/01/22 1118 02/01/22 1524 02/02/22 0452 02/03/22 0306 02/04/22 0426  NA 133*  --  139 140  --   K 3.9  --  3.8 3.6  --   CL 96*  --  107 106  --   CO2 23  --  27 26  --   GLUCOSE 379*  --  270* 154*  --   BUN 26*  --  22* 23*  --   CREATININE 1.21*  --  0.85 1.23* 0.90  CALCIUM 9.9  --  8.8* 9.2  --   MG  --  1.8  --  2.2  --   PHOS  --  4.2  --   --   --    Liver Function Tests: Recent Labs  Lab 02/01/22 1118  AST 27  ALT 21  ALKPHOS 82  BILITOT 0.5  PROT 8.9*  ALBUMIN 4.4   CBG: Recent Labs  Lab 02/03/22 0810 02/03/22 1249 02/03/22 1531 02/03/22 2116 02/04/22 0742  GLUCAP 129* 204* 257* 263* 211*    Discharge time spent: greater than 30 minutes.  Signed: Sharen Hones, MD [facility] 02/04/2022

## 2022-02-06 ENCOUNTER — Other Ambulatory Visit: Payer: Self-pay | Admitting: Nurse Practitioner

## 2022-02-06 DIAGNOSIS — F321 Major depressive disorder, single episode, moderate: Secondary | ICD-10-CM

## 2022-02-06 LAB — CULTURE, BLOOD (ROUTINE X 2): Culture: NO GROWTH

## 2022-02-10 ENCOUNTER — Other Ambulatory Visit: Payer: Self-pay | Admitting: Nurse Practitioner

## 2022-02-10 DIAGNOSIS — E1140 Type 2 diabetes mellitus with diabetic neuropathy, unspecified: Secondary | ICD-10-CM

## 2022-02-19 ENCOUNTER — Ambulatory Visit: Payer: Medicare Other | Admitting: Nurse Practitioner

## 2022-02-20 ENCOUNTER — Other Ambulatory Visit (INDEPENDENT_AMBULATORY_CARE_PROVIDER_SITE_OTHER): Payer: Self-pay | Admitting: Vascular Surgery

## 2022-02-21 NOTE — Telephone Encounter (Signed)
D/C 02/01/21/22 Lauraine Rinne PA-

## 2022-03-24 ENCOUNTER — Other Ambulatory Visit (INDEPENDENT_AMBULATORY_CARE_PROVIDER_SITE_OTHER): Payer: Self-pay | Admitting: Vascular Surgery

## 2022-03-25 DIAGNOSIS — I63433 Cerebral infarction due to embolism of bilateral posterior cerebral arteries: Secondary | ICD-10-CM | POA: Diagnosis not present

## 2022-04-22 ENCOUNTER — Ambulatory Visit: Payer: Medicare Other | Admitting: Nurse Practitioner

## 2022-04-22 ENCOUNTER — Encounter: Payer: Self-pay | Admitting: Nurse Practitioner

## 2022-04-22 VITALS — BP 145/88 | HR 95 | Temp 98.0°F | Resp 16 | Ht 67.0 in | Wt 196.3 lb

## 2022-04-22 DIAGNOSIS — I10 Essential (primary) hypertension: Secondary | ICD-10-CM | POA: Diagnosis not present

## 2022-04-22 DIAGNOSIS — M064 Inflammatory polyarthropathy: Secondary | ICD-10-CM | POA: Diagnosis not present

## 2022-04-22 DIAGNOSIS — E1140 Type 2 diabetes mellitus with diabetic neuropathy, unspecified: Secondary | ICD-10-CM | POA: Diagnosis not present

## 2022-04-22 DIAGNOSIS — F321 Major depressive disorder, single episode, moderate: Secondary | ICD-10-CM

## 2022-04-22 DIAGNOSIS — Z794 Long term (current) use of insulin: Secondary | ICD-10-CM

## 2022-04-22 LAB — POCT GLYCOSYLATED HEMOGLOBIN (HGB A1C): Hemoglobin A1C: 11.5 % — AB (ref 4.0–5.6)

## 2022-04-22 MED ORDER — DAPAGLIFLOZIN PROPANEDIOL 10 MG PO TABS: ORAL_TABLET | ORAL | 5 refills | Status: DC

## 2022-04-22 MED ORDER — MIRTAZAPINE 15 MG PO TABS: ORAL_TABLET | ORAL | 1 refills | Status: DC

## 2022-04-22 NOTE — Progress Notes (Signed)
[facility] [place], [place] [address]  Internal MEDICINE  Office Visit Note  Patient Name: Morgan Daniels  [id number]  [id number]  Date of Service: 04/22/2022  Chief Complaint  Patient presents with   Follow-up    Bp check     HPI Morgan Daniels presents for a follow-up visit for med refill, hypertension and diabetes.  Hypertension -- elevated BP, improved when rechecked.  Chronic pain-- due for tramadol refills.  Diabetes -- A1c 11.5, needs to schedule appt with endocrinologist Insomnia and depression -- both improved with mirtazapine. Sleeping better and feels like she is up and able to do more during the day instead of just laying in bed or isolating self.     Current Medication: Outpatient Encounter Medications as of 04/22/2022  Medication Sig Note   Alirocumab (PRALUENT) 150 MG/ML SOAJ Inject 150 mg into the skin every 30 (thirty) days.    amLODipine (NORVASC) 10 MG tablet Take 1 tablet (10 mg total) by mouth daily.    aspirin 81 MG chewable tablet Chew 1 tablet (81 mg total) by mouth daily.    Cholecalciferol (VITAMIN D-3) 125 MCG (5000 UT) TABS Take 5,000 Units by mouth daily.    clopidogrel (PLAVIX) 75 MG tablet Take 75 mg by mouth daily. 02/01/2022: Patient states "don't think I'm still taking that"   Continuous Blood Gluc Receiver (FREESTYLE LIBRE 2 READER) DEVI As directed    Continuous Blood Gluc Sensor (FREESTYLE LIBRE 2 SENSOR) MISC USE TO TEST BLOOD GLUCOSE 4 TIMES A DAY . DX E11.65    dicyclomine (BENTYL) 10 MG capsule TAKE 1 CAPSULE (10 MG TOTAL) BY MOUTH 3 (THREE) TIMES DAILY BEFORE MEALS.    FLUoxetine (PROZAC) 40 MG capsule Take 2 capsules (80 mg total) by mouth daily.    gabapentin (NEURONTIN) 600 MG tablet Take 1 tablet (600 mg total) by mouth in the morning, at noon, in the evening, and at bedtime. 02/01/2022: Patient takes three times daily   gemfibrozil (LOPID) 600 MG tablet Take 1 tablet (600 mg total) by mouth 2 (two) times daily.     insulin aspart (NOVOLOG FLEXPEN) 100 UNIT/ML FlexPen Inject 20 Units into the skin 3 (three) times daily before meals. Needs appointment for further refills. (Patient taking differently: Inject 0-25 Units into the skin 3 (three) times daily before meals. Sliding scale)    losartan (COZAAR) 25 MG tablet Take 1 tablet (25 mg total) by mouth daily.    metoprolol succinate (TOPROL-XL) 50 MG 24 hr tablet Take 1 tablet (50 mg total) by mouth daily.    nitroGLYCERIN (NITRODUR - DOSED IN MG/24 HR) 0.2 mg/hr patch Place 1 patch (0.2 mg total) onto the skin daily.    pentoxifylline (TRENTAL) 400 MG CR tablet Take 400 mg by mouth 2 (two) times daily.    rosuvastatin (CRESTOR) 40 MG tablet Take 1 tablet (40 mg total) by mouth daily. 02/01/2022: Patient states "don't think I'm still taking that"   tiZANidine (ZANAFLEX) 4 MG tablet TAKE 1 TABLET(S) BY MOUTH TWICE A DAY AS NEEDED FOR MUSCLE PAIN AND SPASMS    traMADol (ULTRAM) 50 MG tablet TAKE 1 TABLET BY MOUTH 3 (THREE) TIMES DAILY AS NEEDED FOR MODERATE PAIN OR SEVERE PAIN.    TRESIBA FLEXTOUCH 200 UNIT/ML FlexTouch Pen INJECT 100 UNITS INTO THE SKIN AT BEDTIME. (Patient taking differently: Inject 130 Units into the skin at bedtime.)    ULTICARE MICRO PEN NEEDLES 32G X 4 MM MISC TO USE WITH INSULIN DOSING THREE  TIMES DAILY PRIOR TO MEALS AND AT BEDTIME FOR BASAL INSULIN.    VASCEPA 1 g capsule Take 1 g by mouth 2 (two) times daily.    [DISCONTINUED] FARXIGA 10 MG TABS tablet TAKE 1 TABLET BY MOUTH EVERY DAY BEFORE BREAKFAST    [DISCONTINUED] mirtazapine (REMERON) 15 MG tablet TAKE 1 TABLET BY MOUTH EVERYDAY AT BEDTIME    dapagliflozin propanediol (FARXIGA) 10 MG TABS tablet TAKE 1 TABLET BY MOUTH EVERY DAY BEFORE BREAKFAST    mirtazapine (REMERON) 15 MG tablet TAKE 1 TABLET BY MOUTH EVERYDAY AT BEDTIME    [DISCONTINUED] collagenase (SANTYL) 250 UNIT/GM ointment Apply topically daily.    No facility-administered encounter medications on file as of 04/22/2022.     Surgical History: Past Surgical History:  Procedure Laterality Date   ABDOMINAL HYSTERECTOMY  1990   BACK SURGERY  2000, 2004   CAROTID ENDARTERECTOMY Left 05/06/12   CARPAL TUNNEL RELEASE Left 07/18/2015   Procedure: CARPAL TUNNEL RELEASE;  Surgeon: Earnestine Leys, MD;  Location: [facility];  Service: Orthopedics;  Laterality: Left;   CEREBRAL ANGIOGRAM Bilateral 05/03/2012   Procedure: CEREBRAL ANGIOGRAM;  Surgeon: Angelia Mould, MD;  Location: [facility] CATH LAB;  Service: Cardiovascular;  Laterality: Bilateral;   CHOLECYSTECTOMY  2001   COLONOSCOPY WITH PROPOFOL N/A 11/19/2018   Procedure: COLONOSCOPY WITH PROPOFOL;  Surgeon: Lucilla Lame, MD;  Location: [place];  Service: Endoscopy;  Laterality: N/A;  Diabetic - insulin   CORONARY ARTERY BYPASS GRAFT N/A 01/18/2021   Procedure: CORONARY ARTERY BYPASS GRAFTING (CABG) x 4  USING LEFT INTERNAL MAMMARY ARTERY AND LEFT ENDOSCOPIC GREATER SAPHENOUS VEIN CONDUITS;  Surgeon: [name], MD;  Location: [place];  Service: Open Heart Surgery;  Laterality: N/A;   CORONARY/GRAFT ACUTE MI REVASCULARIZATION N/A 01/13/2021   Procedure: Coronary/Graft Acute MI Revascularization;  Surgeon: Isaias Cowman, MD;  Location: [place] CV LAB;  Service: Cardiovascular;  Laterality: N/A;   ENDARTERECTOMY Left 05/06/2012   Procedure: ENDARTERECTOMY CAROTID;  Surgeon: Angelia Mould, MD;  Location: [place];  Service: Vascular;  Laterality: Left;   ENDARTERECTOMY Right 08/09/2013   Procedure: ENDARTERECTOMY CAROTID-RIGHT;  Surgeon: Angelia Mould, MD;  Location: [place];  Service: Vascular;  Laterality: Right;   ENDOVEIN HARVEST OF GREATER SAPHENOUS VEIN Left 01/18/2021   Procedure: ENDOVEIN HARVEST OF GREATER SAPHENOUS VEIN;  Surgeon: [name], MD;  Location: [place];  Service: Open Heart Surgery;  Laterality: Left;   HERNIA REPAIR     IABP INSERTION Right 01/13/2021   Procedure: IABP Insertion;  Surgeon: Isaias Cowman, MD;  Location: [place] CV [facility];  Service: Cardiovascular;  Laterality: Right;   LEFT HEART CATH AND CORONARY ANGIOGRAPHY N/A 01/13/2021   Procedure: LEFT HEART CATH AND CORONARY ANGIOGRAPHY;  Surgeon: Isaias Cowman, MD;  Location: [place] CV LAB;  Service: Cardiovascular;  Laterality: N/A;   LOWER EXTREMITY ANGIOGRAPHY Left 01/03/2021   Procedure: LOWER EXTREMITY ANGIOGRAPHY;  Surgeon: Algernon Huxley, MD;  Location: [employer] CV LAB;  Service: Cardiovascular;  Laterality: Left;   LOWER EXTREMITY ANGIOGRAPHY Left 02/28/2021   Procedure: LOWER EXTREMITY ANGIOGRAPHY;  Surgeon: Algernon Huxley, MD;  Location: [place] [facility];  Service: Cardiovascular;  Laterality: Left;   PATCH ANGIOPLASTY Left 05/06/2012   Procedure: WITH DACRON PATCH ANGIOPLASTY ;  Surgeon: Angelia Mould, MD;  Location: [place];  Service: Vascular;  Laterality: Left;   POLYPECTOMY  11/19/2018   Procedure: POLYPECTOMY;  Surgeon: Lucilla Lame, MD;  Location: [place];  Service: Endoscopy;;   SPINE SURGERY  2004   TEE WITHOUT CARDIOVERSION N/A 01/18/2021   Procedure: TRANSESOPHAGEAL ECHOCARDIOGRAM (TEE);  Surgeon: [name], MD;  Location: [place];  Service: Open Heart Surgery;  Laterality: N/A;   TONSILLECTOMY     TUBAL LIGATION      Medical History: Past Medical History:  Diagnosis Date   Anxiety    Arthritis    Bilateral carotid artery stenosis 2014   Carotid artery occlusion    Chronic kidney disease 06/18/2015   UTI   Chronic kidney disease 2017   Current smoker    CVA (cerebral vascular accident) ([place]) 2013   Depression    Diabetes ([place])    Diverticulosis    Fatty liver    Fibromyalgia    GERD (gastroesophageal reflux disease)    H/O hiatal hernia    Heart attack ([place]) 01/12/2021   Hiatal hernia    Hypercholesteremia    Hypertension    IBS (irritable bowel syndrome)    PAD (peripheral artery disease) (HCC)    Peptic ulcer    Plantar fasciitis     Stroke ([place]) 01/31/2012   Right side-ministroke   T2DM (type 2 diabetes mellitus) ([place])     Family History: Family History  Problem Relation Age of Onset   Hypertension Mother    Hyperlipidemia Mother    Deep vein thrombosis Mother    Cancer Father    Alcohol abuse Father    Heart failure Father    Hypertension Maternal Grandmother    Deep vein thrombosis Sister    Alcohol abuse Sister    Alcohol abuse Sister     Social History   Socioeconomic History   Marital status: Married    Spouse name: Not on file   Number of children: Not on file   Years of education: Not on file   Highest education level: Not on file  Occupational History   Not on file  Tobacco Use   Smoking status: Former    Packs/day: 1.00    Years: 40.00    Total pack years: 40.00    Types: Cigarettes    Start date: 11/09/1970    Passive exposure: Current   Smokeless tobacco: Never   Tobacco comments:       Vaping Use   Vaping Use: Former  Substance and Sexual Activity   Alcohol use: Not Currently    Comment: rarely   Drug use: No   Sexual activity: Not Currently    Partners: Male  Other Topics Concern   Not on file  Social History Narrative   ** Merged History Encounter **       Social Determinants of Health   Financial Resource Strain: Not on file  Food Insecurity: Not on file  Transportation Needs: Not on file  Physical Activity: Not on file  Stress: Not on file  Social Connections: Not on file  Intimate Partner Violence: Not on file      Review of Systems  Constitutional:  Positive for fatigue. Negative for chills and unexpected weight change.  HENT:  Negative for congestion, rhinorrhea, sneezing and sore throat.   Eyes:  Negative for redness.  Respiratory:  Positive for shortness of breath. Negative for cough, chest tightness and wheezing.   Cardiovascular: Negative.  Negative for chest pain and palpitations.  Gastrointestinal:  Negative for abdominal pain, constipation,  diarrhea, nausea and vomiting.  Genitourinary:  Negative for dysuria and frequency.  Musculoskeletal:  Positive for arthralgias and back pain. Negative  for joint swelling and neck pain.  Skin:  Negative for rash.  Neurological: Negative.  Negative for tremors and numbness.  Hematological:  Negative for adenopathy. Does not bruise/bleed easily.  Psychiatric/Behavioral:  Negative for behavioral problems (Depression), sleep disturbance and suicidal ideas. The patient is not nervous/anxious.     Vital Signs: BP (!) 145/88 Comment: 164/91  Pulse 95 Comment: 108  Temp 98 F (36.7 C)   Resp 16   Ht '5\' 7"'$  (1.702 m)   Wt 196 lb 4.8 oz (89 kg)   SpO2 94%   BMI 30.74 kg/m    Physical Exam Vitals reviewed.  Constitutional:      General: She is not in acute distress.    Appearance: Normal appearance. She is obese. She is not ill-appearing.  HENT:     Head: Normocephalic and atraumatic.  Eyes:     Pupils: Pupils are equal, round, and reactive to light.  Cardiovascular:     Rate and Rhythm: Normal rate and regular rhythm.  Pulmonary:     Effort: Pulmonary effort is normal. No respiratory distress.  Neurological:     Mental Status: She is alert and oriented to person, place, and time.  Psychiatric:        Mood and Affect: Mood normal.        Behavior: Behavior normal.        Assessment/Plan: 1. Essential hypertension Still slightly elevated, continue to monitor at home. Patient will call clinic if it is persistently elevated  2. Type 2 diabetes mellitus with diabetic neuropathy, with long-term current use of insulin ([place]) Encouraged patient to make appt with her endocrinologist and patient agreed she will.  - POCT glycosylated hemoglobin (Hb A1C) - dapagliflozin propanediol (FARXIGA) 10 MG TABS tablet; TAKE 1 TABLET BY MOUTH EVERY DAY BEFORE BREAKFAST  Dispense: 30 tablet; Refill: 5  3. Inflammatory polyarthritis ([place]) Written prescription for tramadol refill given to patient.  Her pharmacy does not accept electronic prescriptions for controlled substances   4. Depression, major, single episode, moderate (HCC) Sleeping better with mirtazapine and able to do more doing the day, continue medication as prescribed.  - mirtazapine (REMERON) 15 MG tablet; TAKE 1 TABLET BY MOUTH EVERYDAY AT BEDTIME  Dispense: 90 tablet; Refill: 1   General Counseling: shirly graziano understanding of the findings of todays visit and agrees with plan of treatment. I have discussed any further diagnostic evaluation that may be needed or ordered today. We also reviewed her medications today. she has been encouraged to call the office with any questions or concerns that should arise related to todays visit.    Orders Placed This Encounter  Procedures   POCT glycosylated hemoglobin (Hb A1C)    Meds ordered this encounter  Medications   dapagliflozin propanediol (FARXIGA) 10 MG TABS tablet    Sig: TAKE 1 TABLET BY MOUTH EVERY DAY BEFORE BREAKFAST    Dispense:  30 tablet    Refill:  5   mirtazapine (REMERON) 15 MG tablet    Sig: TAKE 1 TABLET BY MOUTH EVERYDAY AT BEDTIME    Dispense:  90 tablet    Refill:  1    Return in about 3 months (around 07/16/2022) for F/U, pain med refill, Shadonna Benedick PCP.   Total time spent:30 Minutes Time spent includes review of chart, medications, test results, and follow up plan with the patient.   [place] Controlled Substance Database was reviewed by me.  This patient was seen by Jonetta Osgood, FNP-C in collaboration with Dr. Latricia Heft  Humphrey Rolls as a part of collaborative care agreement.   Sherriann Szuch R. Valetta Fuller, MSN, FNP-C Internal medicine

## 2022-04-23 ENCOUNTER — Encounter: Payer: Self-pay | Admitting: Nurse Practitioner

## 2022-05-11 ENCOUNTER — Other Ambulatory Visit: Payer: Self-pay | Admitting: Nurse Practitioner

## 2022-05-11 DIAGNOSIS — Z760 Encounter for issue of repeat prescription: Secondary | ICD-10-CM

## 2022-05-12 ENCOUNTER — Other Ambulatory Visit: Payer: Self-pay | Admitting: Nurse Practitioner

## 2022-05-12 ENCOUNTER — Other Ambulatory Visit: Payer: Self-pay

## 2022-05-12 DIAGNOSIS — E1140 Type 2 diabetes mellitus with diabetic neuropathy, unspecified: Secondary | ICD-10-CM

## 2022-05-12 MED ORDER — GABAPENTIN 600 MG PO TABS: 600.0000 mg | ORAL_TABLET | Freq: Three times a day (TID) | ORAL | 3 refills | Status: DC

## 2022-05-19 ENCOUNTER — Other Ambulatory Visit: Payer: Self-pay | Admitting: Nurse Practitioner

## 2022-05-19 ENCOUNTER — Other Ambulatory Visit: Payer: Self-pay

## 2022-05-19 DIAGNOSIS — I70222 Atherosclerosis of native arteries of extremities with rest pain, left leg: Secondary | ICD-10-CM

## 2022-05-19 DIAGNOSIS — Z760 Encounter for issue of repeat prescription: Secondary | ICD-10-CM

## 2022-05-19 DIAGNOSIS — I10 Essential (primary) hypertension: Secondary | ICD-10-CM

## 2022-05-19 DIAGNOSIS — K580 Irritable bowel syndrome with diarrhea: Secondary | ICD-10-CM

## 2022-05-19 NOTE — Telephone Encounter (Signed)
Can you please its ok to send

## 2022-05-20 DIAGNOSIS — Z23 Encounter for immunization: Secondary | ICD-10-CM | POA: Diagnosis not present

## 2022-05-20 DIAGNOSIS — Z720 Tobacco use: Secondary | ICD-10-CM | POA: Diagnosis not present

## 2022-05-20 DIAGNOSIS — I25721 Atherosclerosis of autologous artery coronary artery bypass graft(s) with angina pectoris with documented spasm: Secondary | ICD-10-CM | POA: Diagnosis not present

## 2022-05-20 DIAGNOSIS — E7800 Pure hypercholesterolemia, unspecified: Secondary | ICD-10-CM | POA: Diagnosis not present

## 2022-05-20 DIAGNOSIS — I739 Peripheral vascular disease, unspecified: Secondary | ICD-10-CM | POA: Diagnosis not present

## 2022-05-20 DIAGNOSIS — J9601 Acute respiratory failure with hypoxia: Secondary | ICD-10-CM | POA: Diagnosis not present

## 2022-05-20 DIAGNOSIS — I63433 Cerebral infarction due to embolism of bilateral posterior cerebral arteries: Secondary | ICD-10-CM | POA: Diagnosis not present

## 2022-05-20 DIAGNOSIS — Z95818 Presence of other cardiac implants and grafts: Secondary | ICD-10-CM | POA: Diagnosis not present

## 2022-05-20 DIAGNOSIS — I10 Essential (primary) hypertension: Secondary | ICD-10-CM | POA: Diagnosis not present

## 2022-06-02 ENCOUNTER — Ambulatory Visit: Payer: Medicare Other | Admitting: Nurse Practitioner

## 2022-06-03 ENCOUNTER — Other Ambulatory Visit: Payer: Self-pay

## 2022-06-03 ENCOUNTER — Encounter: Payer: Self-pay | Admitting: Nurse Practitioner

## 2022-06-03 ENCOUNTER — Ambulatory Visit (INDEPENDENT_AMBULATORY_CARE_PROVIDER_SITE_OTHER): Payer: Medicare HMO | Admitting: Nurse Practitioner

## 2022-06-03 VITALS — BP 176/91 | HR 96 | Temp 98.4°F | Resp 16 | Ht 67.0 in | Wt 191.8 lb

## 2022-06-03 DIAGNOSIS — I10 Essential (primary) hypertension: Secondary | ICD-10-CM | POA: Diagnosis not present

## 2022-06-03 DIAGNOSIS — E781 Pure hyperglyceridemia: Secondary | ICD-10-CM

## 2022-06-03 DIAGNOSIS — N390 Urinary tract infection, site not specified: Secondary | ICD-10-CM | POA: Diagnosis not present

## 2022-06-03 DIAGNOSIS — M064 Inflammatory polyarthropathy: Secondary | ICD-10-CM

## 2022-06-03 DIAGNOSIS — E1142 Type 2 diabetes mellitus with diabetic polyneuropathy: Secondary | ICD-10-CM | POA: Diagnosis not present

## 2022-06-03 LAB — POCT URINALYSIS DIPSTICK
Bilirubin, UA: NEGATIVE
Glucose, UA: NEGATIVE
Leukocytes, UA: NEGATIVE
Protein, UA: POSITIVE — AB
Spec Grav, UA: 1.01 (ref 1.010–1.025)
Urobilinogen, UA: 0.2 E.U./dL
pH, UA: 5 (ref 5.0–8.0)

## 2022-06-03 MED ORDER — PRALUENT 150 MG/ML ~~LOC~~ SOAJ: 150.0000 mg | SUBCUTANEOUS | 3 refills | Status: DC

## 2022-06-03 MED ORDER — OMNIPOD DASH PODS (GEN 4) MISC: 11 refills | Status: DC

## 2022-06-03 MED ORDER — FREESTYLE LIBRE 2 SENSOR MISC: 3 refills | Status: DC

## 2022-06-03 MED ORDER — CIPROFLOXACIN HCL 500 MG PO TABS: 500.0000 mg | ORAL_TABLET | Freq: Two times a day (BID) | ORAL | 0 refills | Status: AC

## 2022-06-03 MED ORDER — CIPROFLOXACIN HCL 500 MG PO TABS: 500.0000 mg | ORAL_TABLET | Freq: Two times a day (BID) | ORAL | 0 refills | Status: DC

## 2022-06-03 NOTE — Progress Notes (Signed)
[facility] [address] [place], [place] [address]  Internal MEDICINE  Office Visit Note  Patient Name: Morgan Daniels  [id number]  [id number]  Date of Service: 06/03/2022  Chief Complaint  Patient presents with   Acute Visit    Burning sensation, urine odor and cloudiness, chills, fever.     HPI Japera presents for an acute sick visit for UTI --back pain, urgency, frequency, urine odor, pain with urination --has had symptoms for several days. Urinalysis is positive for nitrites and moderate blood. --------high cholesterol -- severely elevated and is on max dose of all possible meds. Praluent was approved by her insurance but she has not started the medication yet. --------diabetes -- has been seeing endocrinology but is still poorly controlled. Significantly elevated blood sugars per patient report. Has not been checking sugars at home lately but A1c is high. Endocrinology was trying to get the omnipod for her but this has not been approved by her insurance yet.  --------she has not been consistent with attending all appointments and has had trouble keeping track of medications and changes to regimen. Needs consistent medical care. Encouraged to come for earlier appointment to get her on the right track with her medications and treatment for all conditions.     Current Medication:  Outpatient Encounter Medications as of 06/03/2022  Medication Sig Note   amLODipine (NORVASC) 10 MG tablet TAKE 1 TABLET BY MOUTH EVERY DAY    aspirin 81 MG chewable tablet Chew 1 tablet (81 mg total) by mouth daily.    Cholecalciferol (VITAMIN D-3) 125 MCG (5000 UT) TABS Take 5,000 Units by mouth daily.    clopidogrel (PLAVIX) 75 MG tablet Take 75 mg by mouth daily. 02/01/2022: Patient states "don't think I'm still taking that"   Continuous Blood Gluc Receiver (FREESTYLE LIBRE 2 READER) DEVI As directed    dapagliflozin propanediol (FARXIGA) 10 MG TABS tablet TAKE 1 TABLET BY MOUTH EVERY  DAY BEFORE BREAKFAST    dicyclomine (BENTYL) 10 MG capsule TAKE 1 CAPSULE BY MOUTH 3 TIMES DAILY BEFORE MEALS    FLUoxetine (PROZAC) 40 MG capsule TAKE 2 CAPSULES BY MOUTH DAILY    gabapentin (NEURONTIN) 600 MG tablet Take 1 tablet (600 mg total) by mouth 3 (three) times daily.    gemfibrozil (LOPID) 600 MG tablet TAKE 1 TABLET BY MOUTH 2 TIMES DAILY    insulin aspart (NOVOLOG FLEXPEN) 100 UNIT/ML FlexPen Inject 20 Units into the skin 3 (three) times daily before meals. Needs appointment for further refills. (Patient taking differently: Inject 0-25 Units into the skin 3 (three) times daily before meals. Sliding scale)    Insulin Disposable Pump (OMNIPOD DASH PODS, GEN 4,) MISC Apply 1 pod every 72 hours for subcutaneous infusion of insulin.    losartan (COZAAR) 25 MG tablet Take 1 tablet (25 mg total) by mouth daily.    metoprolol succinate (TOPROL-XL) 50 MG 24 hr tablet Take 1 tablet (50 mg total) by mouth daily.    mirtazapine (REMERON) 15 MG tablet TAKE 1 TABLET BY MOUTH EVERYDAY AT BEDTIME    nitroGLYCERIN (NITRODUR - DOSED IN MG/24 HR) 0.2 mg/hr patch Place 1 patch (0.2 mg total) onto the skin daily.    pentoxifylline (TRENTAL) 400 MG CR tablet Take 400 mg by mouth 2 (two) times daily.    rosuvastatin (CRESTOR) 40 MG tablet Take 1 tablet (40 mg total) by mouth daily. 02/01/2022: Patient states "don't think I'm still taking that"   tiZANidine (ZANAFLEX) 4 MG tablet TAKE 1  TABLET BY MOUTH TWICE DAILY AS NEEDED FOR MUSCLE PAIN AND SPASMS    traMADol (ULTRAM) 50 MG tablet TAKE 1 TABLET BY MOUTH 3 (THREE) TIMES DAILY AS NEEDED FOR MODERATE PAIN OR SEVERE PAIN.    TRESIBA FLEXTOUCH 200 UNIT/ML FlexTouch Pen INJECT 100 UNITS INTO THE SKIN AT BEDTIME. (Patient taking differently: Inject 130 Units into the skin at bedtime.)    ULTICARE MICRO PEN NEEDLES 32G X 4 MM MISC TO USE WITH INSULIN DOSING THREE TIMES DAILY PRIOR TO MEALS AND AT BEDTIME FOR BASAL INSULIN.    [DISCONTINUED] Alirocumab (PRALUENT)  150 MG/ML SOAJ Inject 150 mg into the skin every 30 (thirty) days.    [DISCONTINUED] ciprofloxacin (CIPRO) 500 MG tablet Take 1 tablet (500 mg total) by mouth 2 (two) times daily for 5 days.    [DISCONTINUED] Continuous Blood Gluc Sensor (FREESTYLE LIBRE 2 SENSOR) MISC USE TO TEST BLOOD GLUCOSE 4 TIMES A DAY . DX E11.65    [DISCONTINUED] VASCEPA 1 g capsule Take 1 g by mouth 2 (two) times daily.    Continuous Glucose Sensor (FREESTYLE LIBRE 2 SENSOR) MISC Use 1 sensor every 14 days to check glucose readings.    [DISCONTINUED] Alirocumab (PRALUENT) 150 MG/ML SOAJ Inject 1 mL (150 mg total) into the skin every 30 (thirty) days.    No facility-administered encounter medications on file as of 06/03/2022.      Medical History: Past Medical History:  Diagnosis Date   Anxiety    Arthritis    Bilateral carotid artery stenosis 2014   Carotid artery occlusion    Chronic kidney disease 06/18/2015   UTI   Chronic kidney disease 2017   Current smoker    CVA (cerebral vascular accident) (HCC) 2013   Depression    Diabetes (HCC)    Diverticulosis    Fatty liver    Fibromyalgia    GERD (gastroesophageal reflux disease)    H/O hiatal hernia    Heart attack (HCC) 01/12/2021   Hiatal hernia    Hypercholesteremia    Hypertension    IBS (irritable bowel syndrome)    PAD (peripheral artery disease) (HCC)    Peptic ulcer    Plantar fasciitis    Stroke (HCC) 01/31/2012   Right side-ministroke   T2DM (type 2 diabetes mellitus) (HCC)      Vital Signs: BP (!) 176/91   Pulse 96   Temp 98.4 F (36.9 C)   Resp 16   Ht 5\' 7"  (1.702 m)   Wt 191 lb 12.8 oz (87 kg)   SpO2 97%   BMI 30.04 kg/m    Review of Systems  Constitutional:  Positive for fatigue.  HENT: Negative.    Respiratory:  Positive for cough and shortness of breath. Negative for chest tightness and wheezing.   Cardiovascular:  Negative for chest pain and palpitations.  Genitourinary:  Positive for dysuria, flank pain,  frequency, hematuria, pelvic pain and urgency.  Musculoskeletal:  Positive for arthralgias and back pain.    Physical Exam Vitals reviewed.  Constitutional:      General: She is not in acute distress.    Appearance: Normal appearance. She is obese. She is ill-appearing.  HENT:     Head: Normocephalic and atraumatic.  Eyes:     Pupils: Pupils are equal, round, and reactive to light.  Cardiovascular:     Rate and Rhythm: Normal rate and regular rhythm.  Pulmonary:     Effort: Pulmonary effort is normal. No respiratory distress.  Neurological:  Mental Status: She is alert and oriented to person, place, and time.  Psychiatric:        Mood and Affect: Mood normal.        Behavior: Behavior normal.       Assessment/Plan: 1. Urinary tract infection without hematuria, site unspecified Positive urinalysis Urine sent for culture  Ciprofloxacin prescribed for empiric antibiotic treatment.  - POCT urinalysis dipstick - CULTURE, URINE COMPREHENSIVE  2. Type 2 diabetes mellitus with polyneuropathy (HCC) Omnipod dash disposable insulin pump prescribed, will need prior authorization.  Freestyle libre sensor prescribed as well.  Will follow closely and sooner to discuss pump, sensor and sugars.  - Continuous Glucose Sensor (FREESTYLE LIBRE 2 SENSOR) MISC; Use 1 sensor every 14 days to check glucose readings.  Dispense: 2 each; Refill: 3 - Insulin Disposable Pump (OMNIPOD DASH PODS, GEN 4,) MISC; Apply 1 pod every 72 hours for subcutaneous infusion of insulin.  Dispense: 10 each; Refill: 11  3. Familial hypertriglyceridemia Encouraged patient to start taking her praluent injection every 2 weeks as ordered since it was approved by her insurance.  Continue all other cholesterol medications as prescribed.   4. Essential hypertension Encouraged patient to continue her blood pressure medications and check blood pressure at home. We will recheck blood pressure at her next office visit.    5. Inflammatory polyarthritis (HCC) Continue prescribed medications for joint pains.    General Counseling: tasneem grief understanding of the findings of todays visit and agrees with plan of treatment. I have discussed any further diagnostic evaluation that may be needed or ordered today. We also reviewed her medications today. she has been encouraged to call the office with any questions or concerns that should arise related to todays visit.    Counseling:    Orders Placed This Encounter  Procedures   CULTURE, URINE COMPREHENSIVE   POCT urinalysis dipstick    Meds ordered this encounter  Medications   DISCONTD: Alirocumab (PRALUENT) 150 MG/ML SOAJ    Sig: Inject 1 mL (150 mg total) into the skin every 30 (thirty) days.    Dispense:  1 mL    Refill:  3    Approved in December 2023. Please fill for patient.   Continuous Glucose Sensor (FREESTYLE LIBRE 2 SENSOR) MISC    Sig: Use 1 sensor every 14 days to check glucose readings.    Dispense:  2 each    Refill:  3    E11.65   DISCONTD: ciprofloxacin (CIPRO) 500 MG tablet    Sig: Take 1 tablet (500 mg total) by mouth 2 (two) times daily for 5 days.    Dispense:  10 tablet    Refill:  0   Insulin Disposable Pump (OMNIPOD DASH PODS, GEN 4,) MISC    Sig: Apply 1 pod every 72 hours for subcutaneous infusion of insulin.    Dispense:  10 each    Refill:  11    Dx code E11.65    Return in about 3 weeks (around 06/24/2022) for F/U cholesterol and sugars. .  [place] Controlled Substance Database was reviewed by me for overdose risk score (ORS)  Time spent:30 Minutes Time spent with patient included reviewing progress notes, labs, imaging studies, and discussing plan for follow up.   This patient was seen by Sallyanne Kuster, FNP-C in collaboration with Dr. Beverely Risen as a part of collaborative care agreement.  Digby Groeneveld R. Tedd Sias, MSN, FNP-C Internal Medicine

## 2022-06-04 ENCOUNTER — Telehealth: Payer: Self-pay

## 2022-06-04 NOTE — Telephone Encounter (Signed)
Gardiner Rhyme from [employer] [phone] called that pt is enrolled for health plan they just like to clarify that is diabetic and cardiovascular disease

## 2022-06-06 ENCOUNTER — Telehealth: Payer: Self-pay

## 2022-06-06 LAB — CULTURE, URINE COMPREHENSIVE

## 2022-06-06 NOTE — Telephone Encounter (Signed)
Completed P.A. for patient's Praluent.

## 2022-06-11 DIAGNOSIS — H35033 Hypertensive retinopathy, bilateral: Secondary | ICD-10-CM | POA: Diagnosis not present

## 2022-06-11 DIAGNOSIS — H43823 Vitreomacular adhesion, bilateral: Secondary | ICD-10-CM | POA: Diagnosis not present

## 2022-06-11 DIAGNOSIS — E113313 Type 2 diabetes mellitus with moderate nonproliferative diabetic retinopathy with macular edema, bilateral: Secondary | ICD-10-CM | POA: Diagnosis not present

## 2022-06-16 ENCOUNTER — Other Ambulatory Visit: Payer: Self-pay | Admitting: Nurse Practitioner

## 2022-06-17 ENCOUNTER — Other Ambulatory Visit: Payer: Self-pay

## 2022-06-17 MED ORDER — VASCEPA 1 G PO CAPS: 1.0000 g | ORAL_CAPSULE | Freq: Two times a day (BID) | ORAL | 1 refills | Status: DC

## 2022-06-24 ENCOUNTER — Encounter: Payer: Self-pay | Admitting: Nurse Practitioner

## 2022-06-24 ENCOUNTER — Ambulatory Visit (INDEPENDENT_AMBULATORY_CARE_PROVIDER_SITE_OTHER): Payer: Medicare HMO | Admitting: Nurse Practitioner

## 2022-06-24 VITALS — BP 150/80 | HR 87 | Temp 98.1°F | Resp 16 | Ht 67.0 in | Wt 193.2 lb

## 2022-06-24 DIAGNOSIS — E781 Pure hyperglyceridemia: Secondary | ICD-10-CM | POA: Diagnosis not present

## 2022-06-24 DIAGNOSIS — M79672 Pain in left foot: Secondary | ICD-10-CM

## 2022-06-24 DIAGNOSIS — M79671 Pain in right foot: Secondary | ICD-10-CM

## 2022-06-24 DIAGNOSIS — I10 Essential (primary) hypertension: Secondary | ICD-10-CM

## 2022-06-24 DIAGNOSIS — E1142 Type 2 diabetes mellitus with diabetic polyneuropathy: Secondary | ICD-10-CM | POA: Diagnosis not present

## 2022-06-24 DIAGNOSIS — Z760 Encounter for issue of repeat prescription: Secondary | ICD-10-CM

## 2022-06-24 DIAGNOSIS — B3731 Acute candidiasis of vulva and vagina: Secondary | ICD-10-CM | POA: Diagnosis not present

## 2022-06-24 MED ORDER — INSULIN ASPART 100 UNIT/ML IJ SOLN: INTRAMUSCULAR | 5 refills | Status: DC

## 2022-06-24 MED ORDER — OMNIPOD DASH INTRO (GEN 4) KIT: 1.0000 | PACK | Freq: Once | 0 refills | Status: AC

## 2022-06-24 MED ORDER — DEXCOM G6 SENSOR MISC: 0 refills | Status: DC

## 2022-06-24 MED ORDER — FLUCONAZOLE 150 MG PO TABS
150.0000 mg | ORAL_TABLET | Freq: Once | ORAL | 0 refills | Status: AC
Start: 2022-06-24 — End: 2022-06-24

## 2022-06-24 MED ORDER — OMNIPOD 5 DEXG7G6 PODS GEN 5 MISC: 1.0000 | 11 refills | Status: DC

## 2022-06-24 MED ORDER — OMNIPOD 5 DEXG7G6 INTRO GEN 5 KIT: 1.0000 | PACK | Freq: Once | 0 refills | Status: AC

## 2022-06-24 MED ORDER — EMPAGLIFLOZIN 25 MG PO TABS: 25.0000 mg | ORAL_TABLET | Freq: Every day | ORAL | 1 refills | Status: DC

## 2022-06-24 MED ORDER — OMNIPOD DASH PODS (GEN 4) MISC: 1.0000 | 11 refills | Status: DC

## 2022-06-24 MED ORDER — DEXCOM G6 TRANSMITTER MISC: 0 refills | Status: DC

## 2022-06-24 MED ORDER — ACCU-CHEK SOFTCLIX LANCETS MISC
12 refills | Status: DC
Start: 2022-06-24 — End: 2023-04-14

## 2022-06-24 MED ORDER — DEXCOM G6 RECEIVER DEVI: 0 refills | Status: DC

## 2022-06-24 MED ORDER — ACCU-CHEK GUIDE VI STRP
ORAL_STRIP | 12 refills | Status: AC
Start: 2022-06-24 — End: ?

## 2022-06-24 MED ORDER — ACCU-CHEK GUIDE ME W/DEVICE KIT
1.0000 | PACK | Freq: Once | 0 refills | Status: AC
Start: 2022-06-24 — End: 2022-06-24

## 2022-06-24 MED ORDER — TRAMADOL HCL 50 MG PO TABS: ORAL_TABLET | ORAL | 2 refills | Status: DC

## 2022-06-24 NOTE — Progress Notes (Signed)
[facility] [address] [place], [place] [address]  Internal MEDICINE  Office Visit Note  Patient Name: Morgan Daniels  [id number]  [id number]  Date of Service: 06/24/2022  Chief Complaint  Patient presents with   Depression   Diabetes   Gastroesophageal Reflux   Hypertension   Hyperlipidemia   Follow-up    HPI Dasya presents for a follow-up visit for diabetes, high cholesterol, and hypertension Diabetes -- has been seeing endocrinology but things have not been moving along like she wanted them to. She wants to get the omnipod disposable insulin pump prescribed and started states that her endocrinologist prescribed it initially but no one submitted the prior authorization for it. Requesting a new prescription for the omnipod and dexcom CGM. Currently, she is also taking jardiance, and a high dose of tresiba. She does not regularly check her glucose level and needs the CGM and insulin pump to have a better control of her diabetes.  High cholesterol -- currently taking vascepa, rosuvastatin, and was approved for praluent but had not started it yet. Hypertension -- slightly elevated today, currently taking amlodipine, losartan, and metoprolol.    Current Medication: Outpatient Encounter Medications as of 06/24/2022  Medication Sig Note   Accu-Chek Softclix Lancets lancets Use 1 lancet before meals and at bed and prn to check glucose with glucose meter.    Alirocumab (PRALUENT) 150 MG/ML SOAJ Inject 1 mL (150 mg total) into the skin every 30 (thirty) days.    amLODipine (NORVASC) 10 MG tablet TAKE 1 TABLET BY MOUTH EVERY DAY    aspirin 81 MG chewable tablet Chew 1 tablet (81 mg total) by mouth daily.    [EXPIRED] Blood Glucose Monitoring Suppl (ACCU-CHEK GUIDE ME) w/Device KIT 1 Device by Does not apply route once for 1 dose.    Cholecalciferol (VITAMIN D-3) 125 MCG (5000 UT) TABS Take 5,000 Units by mouth daily.    clopidogrel (PLAVIX) 75 MG tablet Take 75 mg by mouth  daily. 02/01/2022: Patient states "don't think I'm still taking that"   Continuous Glucose Receiver (DEXCOM G6 RECEIVER) DEVI Use as indicated for uncontrolled dm    Continuous Glucose Sensor (DEXCOM G6 SENSOR) MISC Use 1 sensor every 10 days to check glucose levels.    dicyclomine (BENTYL) 10 MG capsule TAKE 1 CAPSULE BY MOUTH 3 TIMES DAILY BEFORE MEALS    empagliflozin (JARDIANCE) 25 MG TABS tablet Take 1 tablet (25 mg total) by mouth daily before breakfast.    [EXPIRED] fluconazole (DIFLUCAN) 150 MG tablet Take 1 tablet (150 mg total) by mouth once for 1 dose. May take an additional dose after 3 days if still symptomatic.    FLUoxetine (PROZAC) 40 MG capsule TAKE 2 CAPSULES BY MOUTH DAILY    gabapentin (NEURONTIN) 600 MG tablet Take 1 tablet (600 mg total) by mouth 3 (three) times daily.    glucose blood (ACCU-CHEK GUIDE) test strip Use 1 test strip to check glucose before meals, at bedtime and prn with glucose meter    insulin aspart (NOVOLOG FLEXPEN) 100 UNIT/ML FlexPen Inject 20 Units into the skin 3 (three) times daily before meals. Needs appointment for further refills. (Patient taking differently: Inject 0-25 Units into the skin 3 (three) times daily before meals. Sliding scale)    insulin aspart (NOVOLOG) 100 UNIT/ML injection Use insulin with omnipod and change out every 72 hours. Max 24 hour dose: 100 units.    [EXPIRED] Insulin Disposable Pump (OMNIPOD 5 G6 INTRO, GEN 5,) KIT 1 kit by  Does not apply route once for 1 dose.    Insulin Disposable Pump (OMNIPOD 5 G6 PODS, GEN 5,) MISC 1 Device by Subcutaneous Infusion route every 3 (three) days.    [EXPIRED] Insulin Disposable Pump (OMNIPOD DASH INTRO, GEN 4,) KIT 1 kit by Does not apply route once for 1 dose.    Insulin Disposable Pump (OMNIPOD DASH PODS, GEN 4,) MISC 1 Device by Subcutaneous Infusion route every 3 (three) days.    losartan (COZAAR) 25 MG tablet Take 1 tablet (25 mg total) by mouth daily.    metoprolol succinate (TOPROL-XL)  50 MG 24 hr tablet Take 1 tablet (50 mg total) by mouth daily.    mirtazapine (REMERON) 15 MG tablet TAKE 1 TABLET BY MOUTH EVERYDAY AT BEDTIME    nitroGLYCERIN (NITRODUR - DOSED IN MG/24 HR) 0.2 mg/hr patch Place 1 patch (0.2 mg total) onto the skin daily.    pentoxifylline (TRENTAL) 400 MG CR tablet Take 400 mg by mouth 2 (two) times daily.    rosuvastatin (CRESTOR) 40 MG tablet Take 1 tablet (40 mg total) by mouth daily. 02/01/2022: Patient states "don't think I'm still taking that"   tiZANidine (ZANAFLEX) 4 MG tablet TAKE 1 TABLET BY MOUTH TWICE DAILY AS NEEDED FOR MUSCLE PAIN AND SPASMS    TRESIBA FLEXTOUCH 200 UNIT/ML FlexTouch Pen INJECT 100 UNITS INTO THE SKIN AT BEDTIME. (Patient taking differently: Inject 130 Units into the skin at bedtime.)    ULTICARE MICRO PEN NEEDLES 32G X 4 MM MISC TO USE WITH INSULIN DOSING THREE TIMES DAILY PRIOR TO MEALS AND AT BEDTIME FOR BASAL INSULIN.    VASCEPA 1 g capsule Take 1 capsule (1 g total) by mouth 2 (two) times daily.    [DISCONTINUED] Continuous Blood Gluc Receiver (FREESTYLE LIBRE 2 READER) DEVI As directed    [DISCONTINUED] Continuous Glucose Sensor (FREESTYLE LIBRE 2 SENSOR) MISC Use 1 sensor every 14 days to check glucose readings.    [DISCONTINUED] Continuous Glucose Transmitter (DEXCOM G6 TRANSMITTER) MISC Use as indicated ( e11.65)    [DISCONTINUED] dapagliflozin propanediol (FARXIGA) 10 MG TABS tablet TAKE 1 TABLET BY MOUTH EVERY DAY BEFORE BREAKFAST    [DISCONTINUED] gemfibrozil (LOPID) 600 MG tablet TAKE 1 TABLET BY MOUTH 2 TIMES DAILY    [DISCONTINUED] Insulin Disposable Pump (OMNIPOD DASH PODS, GEN 4,) MISC Apply 1 pod every 72 hours for subcutaneous infusion of insulin.    [DISCONTINUED] traMADol (ULTRAM) 50 MG tablet TAKE 1 TABLET BY MOUTH 3 (THREE) TIMES DAILY AS NEEDED FOR MODERATE PAIN OR SEVERE PAIN.    traMADol (ULTRAM) 50 MG tablet TAKE 1 TABLET BY MOUTH 3 (THREE) TIMES DAILY AS NEEDED FOR MODERATE PAIN OR SEVERE PAIN.    No  facility-administered encounter medications on file as of 06/24/2022.    Surgical History: Past Surgical History:  Procedure Laterality Date   ABDOMINAL HYSTERECTOMY  1990   BACK SURGERY  2000, 2004   CAROTID ENDARTERECTOMY Left 05/06/12   CARPAL TUNNEL RELEASE Left 07/18/2015   Procedure: CARPAL TUNNEL RELEASE;  Surgeon: Deeann Saint, MD;  Location: [facility] ORS;  Service: Orthopedics;  Laterality: Left;   CEREBRAL ANGIOGRAM Bilateral 05/03/2012   Procedure: CEREBRAL ANGIOGRAM;  Surgeon: Chuck Hint, MD;  Location: [facility] CATH LAB;  Service: Cardiovascular;  Laterality: Bilateral;   CHOLECYSTECTOMY  2001   COLONOSCOPY WITH PROPOFOL N/A 11/19/2018   Procedure: COLONOSCOPY WITH PROPOFOL;  Surgeon: Midge Minium, MD;  Location: [facility] SURGERY CNTR;  Service: Endoscopy;  Laterality: N/A;  Diabetic - insulin  CORONARY ARTERY BYPASS GRAFT N/A 01/18/2021   Procedure: CORONARY ARTERY BYPASS GRAFTING (CABG) x 4  USING LEFT INTERNAL MAMMARY ARTERY AND LEFT ENDOSCOPIC GREATER SAPHENOUS VEIN CONDUITS;  Surgeon: Loreli Slot, MD;  Location: [facility] OR;  Service: Open Heart Surgery;  Laterality: N/A;   CORONARY/GRAFT ACUTE MI REVASCULARIZATION N/A 01/13/2021   Procedure: Coronary/Graft Acute MI Revascularization;  Surgeon: Marcina Millard, MD;  Location: [facility] INVASIVE CV LAB;  Service: Cardiovascular;  Laterality: N/A;   ENDARTERECTOMY Left 05/06/2012   Procedure: ENDARTERECTOMY CAROTID;  Surgeon: Chuck Hint, MD;  Location: [facility] OR;  Service: Vascular;  Laterality: Left;   ENDARTERECTOMY Right 08/09/2013   Procedure: ENDARTERECTOMY CAROTID-RIGHT;  Surgeon: Chuck Hint, MD;  Location: [facility] OR;  Service: Vascular;  Laterality: Right;   ENDOVEIN HARVEST OF GREATER SAPHENOUS VEIN Left 01/18/2021   Procedure: ENDOVEIN HARVEST OF GREATER SAPHENOUS VEIN;  Surgeon: Loreli Slot, MD;  Location: [facility] OR;  Service: Open Heart Surgery;  Laterality: Left;   HERNIA REPAIR     IABP INSERTION  Right 01/13/2021   Procedure: IABP Insertion;  Surgeon: Marcina Millard, MD;  Location: [facility] INVASIVE CV LAB;  Service: Cardiovascular;  Laterality: Right;   LEFT HEART CATH AND CORONARY ANGIOGRAPHY N/A 01/13/2021   Procedure: LEFT HEART CATH AND CORONARY ANGIOGRAPHY;  Surgeon: Marcina Millard, MD;  Location: [facility] INVASIVE CV LAB;  Service: Cardiovascular;  Laterality: N/A;   LOWER EXTREMITY ANGIOGRAPHY Left 01/03/2021   Procedure: LOWER EXTREMITY ANGIOGRAPHY;  Surgeon: Annice Needy, MD;  Location: [facility] INVASIVE CV LAB;  Service: Cardiovascular;  Laterality: Left;   LOWER EXTREMITY ANGIOGRAPHY Left 02/28/2021   Procedure: LOWER EXTREMITY ANGIOGRAPHY;  Surgeon: Annice Needy, MD;  Location: [facility] INVASIVE CV LAB;  Service: Cardiovascular;  Laterality: Left;   PATCH ANGIOPLASTY Left 05/06/2012   Procedure: WITH DACRON PATCH ANGIOPLASTY ;  Surgeon: Chuck Hint, MD;  Location: [facility] OR;  Service: Vascular;  Laterality: Left;   POLYPECTOMY  11/19/2018   Procedure: POLYPECTOMY;  Surgeon: Midge Minium, MD;  Location: [facility];  Service: Endoscopy;;   SPINE SURGERY  2004   TEE WITHOUT CARDIOVERSION N/A 01/18/2021   Procedure: TRANSESOPHAGEAL ECHOCARDIOGRAM (TEE);  Surgeon: Loreli Slot, MD;  Location: [facility] OR;  Service: Open Heart Surgery;  Laterality: N/A;   TONSILLECTOMY     TUBAL LIGATION      Medical History: Past Medical History:  Diagnosis Date   Anxiety    Arthritis    Bilateral carotid artery stenosis 2014   Carotid artery occlusion    Chronic kidney disease 06/18/2015   UTI   Chronic kidney disease 2017   Current smoker    CVA (cerebral vascular accident) (HCC) 2013   Depression    Diabetes (HCC)    Diverticulosis    Fatty liver    Fibromyalgia    GERD (gastroesophageal reflux disease)    H/O hiatal hernia    Heart attack (HCC) 01/12/2021   Hiatal hernia    Hypercholesteremia    Hypertension    IBS (irritable bowel syndrome)    PAD (peripheral  artery disease) (HCC)    Peptic ulcer    Plantar fasciitis    Stroke (HCC) 01/31/2012   Right side-ministroke   T2DM (type 2 diabetes mellitus) (HCC)     Family History: Family History  Problem Relation Age of Onset   Hypertension Mother    Hyperlipidemia Mother    Deep vein thrombosis Mother    Cancer Father    Alcohol abuse Father  Heart failure Father    Hypertension Maternal Grandmother    Deep vein thrombosis Sister    Alcohol abuse Sister    Alcohol abuse Sister     Social History   Socioeconomic History   Marital status: Married    Spouse name: Not on file   Number of children: Not on file   Years of education: Not on file   Highest education level: Not on file  Occupational History   Not on file  Tobacco Use   Smoking status: Former    Packs/day: 1.00    Years: 40.00    Additional pack years: 0.00    Total pack years: 40.00    Types: Cigarettes    Start date: 11/09/1970    Passive exposure: Current   Smokeless tobacco: Never   Tobacco comments:       Vaping Use   Vaping Use: Former  Substance and Sexual Activity   Alcohol use: Not Currently    Comment: rarely   Drug use: No   Sexual activity: Not Currently    Partners: Male  Other Topics Concern   Not on file  Social History Narrative   ** Merged History Encounter **       Social Determinants of Health   Financial Resource Strain: Not on file  Food Insecurity: Not on file  Transportation Needs: Not on file  Physical Activity: Not on file  Stress: Not on file  Social Connections: Not on file  Intimate Partner Violence: Not on file      Review of Systems  Constitutional:  Positive for fatigue. Negative for chills and unexpected weight change.  HENT:  Negative for congestion, rhinorrhea, sneezing and sore throat.   Eyes:  Negative for redness.  Respiratory:  Positive for shortness of breath. Negative for cough, chest tightness and wheezing.   Cardiovascular: Negative.  Negative for  chest pain and palpitations.  Gastrointestinal:  Negative for abdominal pain, constipation, diarrhea, nausea and vomiting.  Genitourinary:  Negative for dysuria and frequency.  Musculoskeletal:  Positive for arthralgias and back pain. Negative for joint swelling and neck pain.  Skin:  Negative for rash.  Neurological: Negative.  Negative for tremors and numbness.  Hematological:  Negative for adenopathy. Does not bruise/bleed easily.  Psychiatric/Behavioral:  Negative for behavioral problems (Depression), sleep disturbance and suicidal ideas. The patient is not nervous/anxious.     Vital Signs: BP (!) 150/80   Pulse 87   Temp 98.1 F (36.7 C)   Resp 16   Ht 5\' 7"  (1.702 m)   Wt 193 lb 3.2 oz (87.6 kg)   SpO2 93%   BMI 30.26 kg/m    Physical Exam Vitals reviewed.  Constitutional:      General: She is not in acute distress.    Appearance: Normal appearance. She is obese. She is ill-appearing.  HENT:     Head: Normocephalic and atraumatic.  Eyes:     Pupils: Pupils are equal, round, and reactive to light.  Cardiovascular:     Rate and Rhythm: Normal rate and regular rhythm.  Pulmonary:     Effort: Pulmonary effort is normal. No respiratory distress.  Neurological:     Mental Status: She is alert and oriented to person, place, and time.  Psychiatric:        Mood and Affect: Mood normal.        Behavior: Behavior normal.        Assessment/Plan: 1. Type 2 diabetes mellitus with polyneuropathy (HCC) Will work  on getting prior authorization done for omnipod and dexcom. Continue jardiance and tresiba as prescribed. Regular glucose meter also prescribed  - empagliflozin (JARDIANCE) 25 MG TABS tablet; Take 1 tablet (25 mg total) by mouth daily before breakfast.  Dispense: 90 tablet; Refill: 1 - Insulin Disposable Pump (OMNIPOD 5 G6 INTRO, GEN 5,) KIT; 1 kit by Does not apply route once for 1 dose.  Dispense: 1 kit; Refill: 0 - Insulin Disposable Pump (OMNIPOD DASH INTRO, GEN  4,) KIT; 1 kit by Does not apply route once for 1 dose.  Dispense: 1 kit; Refill: 0 - Insulin Disposable Pump (OMNIPOD 5 G6 PODS, GEN 5,) MISC; 1 Device by Subcutaneous Infusion route every 3 (three) days.  Dispense: 10 each; Refill: 11 - Insulin Disposable Pump (OMNIPOD DASH PODS, GEN 4,) MISC; 1 Device by Subcutaneous Infusion route every 3 (three) days.  Dispense: 10 each; Refill: 11 - insulin aspart (NOVOLOG) 100 UNIT/ML injection; Use insulin with omnipod and change out every 72 hours. Max 24 hour dose: 100 units.  Dispense: 10 mL; Refill: 5 - Continuous Glucose Receiver (DEXCOM G6 RECEIVER) DEVI; Use as indicated for uncontrolled dm  Dispense: 1 each; Refill: 0 - Continuous Glucose Sensor (DEXCOM G6 SENSOR) MISC; Use 1 sensor every 10 days to check glucose levels.  Dispense: 1 each; Refill: 0 - Accu-Chek Softclix Lancets lancets; Use 1 lancet before meals and at bed and prn to check glucose with glucose meter.  Dispense: 100 each; Refill: 12 - Blood Glucose Monitoring Suppl (ACCU-CHEK GUIDE ME) w/Device KIT; 1 Device by Does not apply route once for 1 dose.  Dispense: 1 kit; Refill: 0 - glucose blood (ACCU-CHEK GUIDE) test strip; Use 1 test strip to check glucose before meals, at bedtime and prn with glucose meter  Dispense: 100 each; Refill: 12  2. Familial hypertriglyceridemia Continue medications as prescribed and start praluent   3. Essential hypertension Continue medications as prescribed   4. Inflammatory pain of right heel Take tramadol as prescribed prn - traMADol (ULTRAM) 50 MG tablet; TAKE 1 TABLET BY MOUTH 3 (THREE) TIMES DAILY AS NEEDED FOR MODERATE PAIN OR SEVERE PAIN.  Dispense: 90 tablet; Refill: 2  5. Inflammatory pain of left heel Take prn tramadol as prescribed  - traMADol (ULTRAM) 50 MG tablet; TAKE 1 TABLET BY MOUTH 3 (THREE) TIMES DAILY AS NEEDED FOR MODERATE PAIN OR SEVERE PAIN.  Dispense: 90 tablet; Refill: 2  6. Vulvovaginal candidiasis Take fluconazole as  prescribed  - fluconazole (DIFLUCAN) 150 MG tablet; Take 1 tablet (150 mg total) by mouth once for 1 dose. May take an additional dose after 3 days if still symptomatic.  Dispense: 3 tablet; Refill: 0   General Counseling: juliyana nebel understanding of the findings of todays visit and agrees with plan of treatment. I have discussed any further diagnostic evaluation that may be needed or ordered today. We also reviewed her medications today. she has been encouraged to call the office with any questions or concerns that should arise related to todays visit.    No orders of the defined types were placed in this encounter.   Meds ordered this encounter  Medications   empagliflozin (JARDIANCE) 25 MG TABS tablet    Sig: Take 1 tablet (25 mg total) by mouth daily before breakfast.    Dispense:  90 tablet    Refill:  1    Discontinue farxiga, fill jardiance instead   Insulin Disposable Pump (OMNIPOD 5 G6 INTRO, GEN 5,) KIT    Sig:  1 kit by Does not apply route once for 1 dose.    Dispense:  1 kit    Refill:  0    Dx code E11.65, on high dose basal insulin and preprandial insulin. We prefer this version over the omnipod dash   Insulin Disposable Pump (OMNIPOD DASH INTRO, GEN 4,) KIT    Sig: 1 kit by Does not apply route once for 1 dose.    Dispense:  1 kit    Refill:  0    I am sending both since it is recommended to but we would prefer omnipod 5 G6 if possible.   Insulin Disposable Pump (OMNIPOD 5 G6 PODS, GEN 5,) MISC    Sig: 1 Device by Subcutaneous Infusion route every 3 (three) days.    Dispense:  10 each    Refill:  11    Dx code E11.65   Insulin Disposable Pump (OMNIPOD DASH PODS, GEN 4,) MISC    Sig: 1 Device by Subcutaneous Infusion route every 3 (three) days.    Dispense:  10 each    Refill:  11    Dx code E11.65   insulin aspart (NOVOLOG) 100 UNIT/ML injection    Sig: Use insulin with omnipod and change out every 72 hours. Max 24 hour dose: 100 units.    Dispense:  10 mL     Refill:  5   traMADol (ULTRAM) 50 MG tablet    Sig: TAKE 1 TABLET BY MOUTH 3 (THREE) TIMES DAILY AS NEEDED FOR MODERATE PAIN OR SEVERE PAIN.    Dispense:  90 tablet    Refill:  2   Continuous Glucose Receiver (DEXCOM G6 RECEIVER) DEVI    Sig: Use as indicated for uncontrolled dm    Dispense:  1 each    Refill:  0    Dx code E11.65   DISCONTD: Continuous Glucose Transmitter (DEXCOM G6 TRANSMITTER) MISC    Sig: Use as indicated ( e11.65)    Dispense:  1 each    Refill:  0    Dx code E11.65   Continuous Glucose Sensor (DEXCOM G6 SENSOR) MISC    Sig: Use 1 sensor every 10 days to check glucose levels.    Dispense:  1 each    Refill:  0    Dx code E11.65   Accu-Chek Softclix Lancets lancets    Sig: Use 1 lancet before meals and at bed and prn to check glucose with glucose meter.    Dispense:  100 each    Refill:  12    Dx code E11.65   Blood Glucose Monitoring Suppl (ACCU-CHEK GUIDE ME) w/Device KIT    Sig: 1 Device by Does not apply route once for 1 dose.    Dispense:  1 kit    Refill:  0    Dx code E11.65   glucose blood (ACCU-CHEK GUIDE) test strip    Sig: Use 1 test strip to check glucose before meals, at bedtime and prn with glucose meter    Dispense:  100 each    Refill:  12    Dx code E11.65   fluconazole (DIFLUCAN) 150 MG tablet    Sig: Take 1 tablet (150 mg total) by mouth once for 1 dose. May take an additional dose after 3 days if still symptomatic.    Dispense:  3 tablet    Refill:  0    Return in about 1 month (around 07/25/2022) for F/U, Keyanah Kozicki PCP sugars .   Total time  spent:30 Minutes Time spent includes review of chart, medications, test results, and follow up plan with the patient.   [place] Controlled Substance Database was reviewed by me.  This patient was seen by Sallyanne Kuster, FNP-C in collaboration with Dr. Beverely Risen as a part of collaborative care agreement.   Karsynn Deweese R. Tedd Sias, MSN, FNP-C Internal medicine

## 2022-07-09 ENCOUNTER — Other Ambulatory Visit: Payer: Self-pay | Admitting: Nurse Practitioner

## 2022-07-14 ENCOUNTER — Encounter: Payer: Self-pay | Admitting: Nurse Practitioner

## 2022-07-16 ENCOUNTER — Ambulatory Visit: Payer: Medicare HMO | Admitting: Nurse Practitioner

## 2022-07-23 ENCOUNTER — Other Ambulatory Visit: Payer: Self-pay | Admitting: Nurse Practitioner

## 2022-07-24 ENCOUNTER — Ambulatory Visit (INDEPENDENT_AMBULATORY_CARE_PROVIDER_SITE_OTHER): Payer: Medicare HMO | Admitting: Nurse Practitioner

## 2022-07-24 VITALS — BP 135/78 | HR 100 | Temp 97.8°F | Resp 16 | Ht 67.0 in | Wt 194.0 lb

## 2022-07-24 DIAGNOSIS — B379 Candidiasis, unspecified: Secondary | ICD-10-CM | POA: Diagnosis not present

## 2022-07-24 DIAGNOSIS — T3695XA Adverse effect of unspecified systemic antibiotic, initial encounter: Secondary | ICD-10-CM

## 2022-07-24 DIAGNOSIS — R300 Dysuria: Secondary | ICD-10-CM | POA: Diagnosis not present

## 2022-07-24 DIAGNOSIS — E1142 Type 2 diabetes mellitus with diabetic polyneuropathy: Secondary | ICD-10-CM | POA: Diagnosis not present

## 2022-07-24 DIAGNOSIS — N3001 Acute cystitis with hematuria: Secondary | ICD-10-CM | POA: Diagnosis not present

## 2022-07-24 DIAGNOSIS — I10 Essential (primary) hypertension: Secondary | ICD-10-CM | POA: Diagnosis not present

## 2022-07-24 DIAGNOSIS — N390 Urinary tract infection, site not specified: Secondary | ICD-10-CM

## 2022-07-24 DIAGNOSIS — R319 Hematuria, unspecified: Secondary | ICD-10-CM | POA: Diagnosis not present

## 2022-07-24 LAB — POCT URINALYSIS DIPSTICK
Bilirubin, UA: NEGATIVE
Glucose, UA: POSITIVE — AB
Nitrite, UA: POSITIVE
Protein, UA: POSITIVE — AB
Spec Grav, UA: 1.01 (ref 1.010–1.025)
Urobilinogen, UA: 0.2 E.U./dL
pH, UA: 6 (ref 5.0–8.0)

## 2022-07-24 LAB — POCT GLYCOSYLATED HEMOGLOBIN (HGB A1C): Hemoglobin A1C: 10.8 % — AB (ref 4.0–5.6)

## 2022-07-24 NOTE — Progress Notes (Signed)
[facility] [address] [place], [place] [address]  Internal MEDICINE  Office Visit Note  Patient Name: Morgan Daniels  [id number]  [id number]  Date of Service: 07/24/2022  Chief Complaint  Patient presents with   Follow-up   Diabetes    HPI Morgan Daniels presents for a follow-up visit for diabetes and hypertension.  Diabetes -- A1c improving to 10.8. receieved phone call from omnipod rep, she is getting set up soon on the omnipod. We will have a follow up a few weeks after she starts on it.  Hypertension -- controlled with current medications Continued UTI symptoms --back pain, urinary discomfort, urgency, frequency    Current Medication: Outpatient Encounter Medications as of 07/24/2022  Medication Sig Note   Accu-Chek Softclix Lancets lancets Use 1 lancet before meals and at bed and prn to check glucose with glucose meter.    Alirocumab (PRALUENT) 150 MG/ML SOAJ Inject 1 mL (150 mg total) into the skin every 30 (thirty) days.    amLODipine (NORVASC) 10 MG tablet TAKE 1 TABLET BY MOUTH EVERY DAY    aspirin 81 MG chewable tablet Chew 1 tablet (81 mg total) by mouth daily.    Cholecalciferol (VITAMIN D-3) 125 MCG (5000 UT) TABS Take 5,000 Units by mouth daily.    clopidogrel (PLAVIX) 75 MG tablet Take 75 mg by mouth daily. 02/01/2022: Patient states "don't think I'm still taking that"   Continuous Glucose Receiver (DEXCOM G6 RECEIVER) DEVI Use as indicated for uncontrolled dm    Continuous Glucose Sensor (DEXCOM G6 SENSOR) MISC Use 1 sensor every 10 days to check glucose levels.    Continuous Glucose Transmitter (DEXCOM G6 TRANSMITTER) MISC USE AS INDICATED    dicyclomine (BENTYL) 10 MG capsule TAKE 1 CAPSULE BY MOUTH 3 TIMES DAILY BEFORE MEALS    empagliflozin (JARDIANCE) 25 MG TABS tablet Take 1 tablet (25 mg total) by mouth daily before breakfast.    fluconazole (DIFLUCAN) 150 MG tablet Take 1 tablet (150 mg total) by mouth once for 1 dose. May take an additional dose after  3 days if still symptomatic.    FLUoxetine (PROZAC) 40 MG capsule TAKE 2 CAPSULES BY MOUTH DAILY    gabapentin (NEURONTIN) 600 MG tablet Take 1 tablet (600 mg total) by mouth 3 (three) times daily.    glucose blood (ACCU-CHEK GUIDE) test strip Use 1 test strip to check glucose before meals, at bedtime and prn with glucose meter    insulin aspart (NOVOLOG FLEXPEN) 100 UNIT/ML FlexPen Inject 20 Units into the skin 3 (three) times daily before meals. Needs appointment for further refills. (Patient taking differently: Inject 0-25 Units into the skin 3 (three) times daily before meals. Sliding scale)    insulin aspart (NOVOLOG) 100 UNIT/ML injection Use insulin with omnipod and change out every 72 hours. Max 24 hour dose: 100 units.    Insulin Disposable Pump (OMNIPOD 5 G6 PODS, GEN 5,) MISC 1 Device by Subcutaneous Infusion route every 3 (three) days.    Insulin Disposable Pump (OMNIPOD DASH PODS, GEN 4,) MISC 1 Device by Subcutaneous Infusion route every 3 (three) days.    losartan (COZAAR) 25 MG tablet Take 1 tablet (25 mg total) by mouth daily.    metoprolol succinate (TOPROL-XL) 50 MG 24 hr tablet Take 1 tablet (50 mg total) by mouth daily.    mirtazapine (REMERON) 15 MG tablet TAKE 1 TABLET BY MOUTH EVERYDAY AT BEDTIME    pentoxifylline (TRENTAL) 400 MG CR tablet Take 400 mg by mouth 2 (  two) times daily.    rosuvastatin (CRESTOR) 40 MG tablet Take 1 tablet (40 mg total) by mouth daily. 02/01/2022: Patient states "don't think I'm still taking that"   tiZANidine (ZANAFLEX) 4 MG tablet TAKE 1 TABLET BY MOUTH TWICE DAILY AS NEEDED FOR MUSCLE PAIN AND SPASMS    traMADol (ULTRAM) 50 MG tablet TAKE 1 TABLET BY MOUTH 3 (THREE) TIMES DAILY AS NEEDED FOR MODERATE PAIN OR SEVERE PAIN.    TRESIBA FLEXTOUCH 200 UNIT/ML FlexTouch Pen INJECT 100 UNITS INTO THE SKIN AT BEDTIME. (Patient taking differently: Inject 130 Units into the skin at bedtime.)    ULTICARE MICRO PEN NEEDLES 32G X 4 MM MISC TO USE WITH INSULIN  DOSING THREE TIMES DAILY PRIOR TO MEALS AND AT BEDTIME FOR BASAL INSULIN.    VASCEPA 1 g capsule Take 1 capsule (1 g total) by mouth 2 (two) times daily.    nitroGLYCERIN (NITRODUR - DOSED IN MG/24 HR) 0.2 mg/hr patch Place 1 patch (0.2 mg total) onto the skin daily.    No facility-administered encounter medications on file as of 07/24/2022.    Surgical History: Past Surgical History:  Procedure Laterality Date   ABDOMINAL HYSTERECTOMY  1990   BACK SURGERY  2000, 2004   CAROTID ENDARTERECTOMY Left 05/06/12   CARPAL TUNNEL RELEASE Left 07/18/2015   Procedure: CARPAL TUNNEL RELEASE;  Surgeon: Deeann Saint, MD;  Location: [facility] ORS;  Service: Orthopedics;  Laterality: Left;   CEREBRAL ANGIOGRAM Bilateral 05/03/2012   Procedure: CEREBRAL ANGIOGRAM;  Surgeon: Chuck Hint, MD;  Location: [facility] CATH LAB;  Service: Cardiovascular;  Laterality: Bilateral;   CHOLECYSTECTOMY  2001   COLONOSCOPY WITH PROPOFOL N/A 11/19/2018   Procedure: COLONOSCOPY WITH PROPOFOL;  Surgeon: Midge Minium, MD;  Location: [facility] SURGERY CNTR;  Service: Endoscopy;  Laterality: N/A;  Diabetic - insulin   CORONARY ARTERY BYPASS GRAFT N/A 01/18/2021   Procedure: CORONARY ARTERY BYPASS GRAFTING (CABG) x 4  USING LEFT INTERNAL MAMMARY ARTERY AND LEFT ENDOSCOPIC GREATER SAPHENOUS VEIN CONDUITS;  Surgeon: Loreli Slot, MD;  Location: [facility] OR;  Service: Open Heart Surgery;  Laterality: N/A;   CORONARY/GRAFT ACUTE MI REVASCULARIZATION N/A 01/13/2021   Procedure: Coronary/Graft Acute MI Revascularization;  Surgeon: Marcina Millard, MD;  Location: [facility] INVASIVE CV LAB;  Service: Cardiovascular;  Laterality: N/A;   ENDARTERECTOMY Left 05/06/2012   Procedure: ENDARTERECTOMY CAROTID;  Surgeon: Chuck Hint, MD;  Location: [facility] OR;  Service: Vascular;  Laterality: Left;   ENDARTERECTOMY Right 08/09/2013   Procedure: ENDARTERECTOMY CAROTID-RIGHT;  Surgeon: Chuck Hint, MD;  Location: [facility] OR;  Service: Vascular;   Laterality: Right;   ENDOVEIN HARVEST OF GREATER SAPHENOUS VEIN Left 01/18/2021   Procedure: ENDOVEIN HARVEST OF GREATER SAPHENOUS VEIN;  Surgeon: Loreli Slot, MD;  Location: [facility] OR;  Service: Open Heart Surgery;  Laterality: Left;   HERNIA REPAIR     IABP INSERTION Right 01/13/2021   Procedure: IABP Insertion;  Surgeon: Marcina Millard, MD;  Location: [facility] INVASIVE CV LAB;  Service: Cardiovascular;  Laterality: Right;   LEFT HEART CATH AND CORONARY ANGIOGRAPHY N/A 01/13/2021   Procedure: LEFT HEART CATH AND CORONARY ANGIOGRAPHY;  Surgeon: Marcina Millard, MD;  Location: [facility] INVASIVE CV LAB;  Service: Cardiovascular;  Laterality: N/A;   LOWER EXTREMITY ANGIOGRAPHY Left 01/03/2021   Procedure: LOWER EXTREMITY ANGIOGRAPHY;  Surgeon: Annice Needy, MD;  Location: [facility] INVASIVE CV LAB;  Service: Cardiovascular;  Laterality: Left;   LOWER EXTREMITY ANGIOGRAPHY Left 02/28/2021   Procedure: LOWER EXTREMITY ANGIOGRAPHY;  Surgeon: Wyn Quaker,  Marlow Baars, MD;  Location: [facility] INVASIVE CV LAB;  Service: Cardiovascular;  Laterality: Left;   PATCH ANGIOPLASTY Left 05/06/2012   Procedure: WITH DACRON PATCH ANGIOPLASTY ;  Surgeon: Chuck Hint, MD;  Location: [facility] OR;  Service: Vascular;  Laterality: Left;   POLYPECTOMY  11/19/2018   Procedure: POLYPECTOMY;  Surgeon: Midge Minium, MD;  Location: [facility] SURGERY CNTR;  Service: Endoscopy;;   SPINE SURGERY  2004   TEE WITHOUT CARDIOVERSION N/A 01/18/2021   Procedure: TRANSESOPHAGEAL ECHOCARDIOGRAM (TEE);  Surgeon: Loreli Slot, MD;  Location: [facility] OR;  Service: Open Heart Surgery;  Laterality: N/A;   TONSILLECTOMY     TUBAL LIGATION      Medical History: Past Medical History:  Diagnosis Date   Anxiety    Arthritis    Bilateral carotid artery stenosis 2014   Carotid artery occlusion    Chronic kidney disease 06/18/2015   UTI   Chronic kidney disease 2017   Current smoker    CVA (cerebral vascular accident) (HCC) 2013   Depression     Diabetes (HCC)    Diverticulosis    Fatty liver    Fibromyalgia    GERD (gastroesophageal reflux disease)    H/O hiatal hernia    Heart attack (HCC) 01/12/2021   Hiatal hernia    Hypercholesteremia    Hypertension    IBS (irritable bowel syndrome)    PAD (peripheral artery disease) (HCC)    Peptic ulcer    Plantar fasciitis    Stroke (HCC) 01/31/2012   Right side-ministroke   T2DM (type 2 diabetes mellitus) (HCC)     Family History: Family History  Problem Relation Age of Onset   Hypertension Mother    Hyperlipidemia Mother    Deep vein thrombosis Mother    Cancer Father    Alcohol abuse Father    Heart failure Father    Hypertension Maternal Grandmother    Deep vein thrombosis Sister    Alcohol abuse Sister    Alcohol abuse Sister     Social History   Socioeconomic History   Marital status: Married    Spouse name: Not on file   Number of children: Not on file   Years of education: Not on file   Highest education level: Not on file  Occupational History   Not on file  Tobacco Use   Smoking status: Former    Packs/day: 1.00    Years: 40.00    Additional pack years: 0.00    Total pack years: 40.00    Types: Cigarettes    Start date: 11/09/1970    Passive exposure: Current   Smokeless tobacco: Never   Tobacco comments:       Vaping Use   Vaping Use: Former  Substance and Sexual Activity   Alcohol use: Not Currently    Comment: rarely   Drug use: No   Sexual activity: Not Currently    Partners: Male  Other Topics Concern   Not on file  Social History Narrative   ** Merged History Encounter **       Social Determinants of Health   Financial Resource Strain: Not on file  Food Insecurity: Not on file  Transportation Needs: Not on file  Physical Activity: Not on file  Stress: Not on file  Social Connections: Not on file  Intimate Partner Violence: Not on file      Review of Systems  Constitutional:  Positive for fatigue. Negative for chills  and unexpected weight change.  HENT:  Negative for congestion, rhinorrhea, sneezing and sore throat.   Eyes:  Negative for redness.  Respiratory: Negative.  Negative for cough, chest tightness, shortness of breath and wheezing.   Cardiovascular: Negative.  Negative for chest pain and palpitations.  Gastrointestinal:  Negative for abdominal pain, constipation, diarrhea, nausea and vomiting.  Genitourinary:  Positive for difficulty urinating, dysuria, flank pain, frequency, hematuria and urgency.  Musculoskeletal:  Positive for arthralgias and back pain. Negative for joint swelling and neck pain.  Skin:  Negative for rash.  Neurological: Negative.  Negative for tremors and numbness.  Hematological:  Negative for adenopathy. Does not bruise/bleed easily.  Psychiatric/Behavioral:  Negative for behavioral problems (Depression), sleep disturbance and suicidal ideas. The patient is not nervous/anxious.     Vital Signs: BP 135/78 Comment: 150/90  Pulse 100   Temp 97.8 F (36.6 C)   Resp 16   Ht 5\' 7"  (1.702 m)   Wt 194 lb (88 kg)   SpO2 94%   BMI 30.38 kg/m    Physical Exam Vitals reviewed.  Constitutional:      General: She is not in acute distress.    Appearance: Normal appearance. She is obese. She is not ill-appearing.  HENT:     Head: Normocephalic and atraumatic.  Eyes:     Pupils: Pupils are equal, round, and reactive to light.  Cardiovascular:     Rate and Rhythm: Normal rate and regular rhythm.  Pulmonary:     Effort: Pulmonary effort is normal. No respiratory distress.  Neurological:     Mental Status: She is alert and oriented to person, place, and time.  Psychiatric:        Mood and Affect: Mood normal.        Behavior: Behavior normal.        Assessment/Plan: 1. Type 2 diabetes mellitus with polyneuropathy (HCC) A1c improving, Continue medications as they are for now, discussed medication regimen changes once she is on omnipod and patient understands.  -  POCT HgB A1C  2. Essential hypertension Stable, continue current medications as prescribed.   3. Acute cystitis with hematuria Urinalysis positive for UTI, urine culture sent. Antibiotic was sent the next day on Friday, 07/25/22 - POCT Urinalysis Dipstick - CULTURE, URINE COMPREHENSIVE  4. Antibiotic-induced yeast infection Prophylactic treatment prescribed for yeast infection since she is prone to yeast infection with antibiotic use.  - fluconazole (DIFLUCAN) 150 MG tablet; Take 1 tablet (150 mg total) by mouth once for 1 dose. May take an additional dose after 3 days if still symptomatic.  Dispense: 3 tablet; Refill: 0  5. Dysuria Urinalysis positive for UTI, urine culture sent. - CULTURE, URINE COMPREHENSIVE   General Counseling: carmilla bardy understanding of the findings of todays visit and agrees with plan of treatment. I have discussed any further diagnostic evaluation that may be needed or ordered today. We also reviewed her medications today. she has been encouraged to call the office with any questions or concerns that should arise related to todays visit.    Orders Placed This Encounter  Procedures   CULTURE, URINE COMPREHENSIVE   POCT HgB A1C   POCT Urinalysis Dipstick    Meds ordered this encounter  Medications   fluconazole (DIFLUCAN) 150 MG tablet    Sig: Take 1 tablet (150 mg total) by mouth once for 1 dose. May take an additional dose after 3 days if still symptomatic.    Dispense:  3 tablet    Refill:  0    Return in  about 2 months (around 09/23/2022) for F/U, [place] PCP omnipod? sugars.   Total time spent:30 Minutes Time spent includes review of chart, medications, test results, and follow up plan with the patient.   [place] Controlled Substance Database was reviewed by me.  This patient was seen by Sallyanne Kuster, FNP-C in collaboration with Dr. Beverely Risen as a part of collaborative care agreement.   Camaryn Lumbert R. Tedd Sias, MSN, FNP-C Internal medicine

## 2022-07-25 ENCOUNTER — Other Ambulatory Visit: Payer: Self-pay

## 2022-07-25 MED ORDER — CIPROFLOXACIN HCL 500 MG PO TABS: 500.0000 mg | ORAL_TABLET | Freq: Two times a day (BID) | ORAL | 0 refills | Status: DC

## 2022-07-25 NOTE — Telephone Encounter (Signed)
As per alyssa sent cipro due to Pt had UTI

## 2022-07-26 ENCOUNTER — Encounter: Payer: Self-pay | Admitting: Nurse Practitioner

## 2022-07-26 MED ORDER — FLUCONAZOLE 150 MG PO TABS
150.0000 mg | ORAL_TABLET | Freq: Once | ORAL | 0 refills | Status: AC
Start: 2022-07-26 — End: 2022-07-26

## 2022-07-29 LAB — CULTURE, URINE COMPREHENSIVE

## 2022-08-15 ENCOUNTER — Other Ambulatory Visit: Payer: Self-pay | Admitting: Nurse Practitioner

## 2022-08-15 DIAGNOSIS — F321 Major depressive disorder, single episode, moderate: Secondary | ICD-10-CM

## 2022-08-15 DIAGNOSIS — Z760 Encounter for issue of repeat prescription: Secondary | ICD-10-CM

## 2022-08-19 ENCOUNTER — Other Ambulatory Visit: Payer: Self-pay | Admitting: Nurse Practitioner

## 2022-08-19 DIAGNOSIS — I10 Essential (primary) hypertension: Secondary | ICD-10-CM

## 2022-09-15 ENCOUNTER — Other Ambulatory Visit: Payer: Self-pay | Admitting: Nurse Practitioner

## 2022-09-15 DIAGNOSIS — M79672 Pain in left foot: Secondary | ICD-10-CM

## 2022-09-15 DIAGNOSIS — M79671 Pain in right foot: Secondary | ICD-10-CM

## 2022-09-15 NOTE — Telephone Encounter (Signed)
Please review and send 

## 2022-09-19 DIAGNOSIS — I25721 Atherosclerosis of autologous artery coronary artery bypass graft(s) with angina pectoris with documented spasm: Secondary | ICD-10-CM | POA: Diagnosis not present

## 2022-09-19 DIAGNOSIS — E7800 Pure hypercholesterolemia, unspecified: Secondary | ICD-10-CM | POA: Diagnosis not present

## 2022-09-19 DIAGNOSIS — Z95818 Presence of other cardiac implants and grafts: Secondary | ICD-10-CM | POA: Diagnosis not present

## 2022-09-19 DIAGNOSIS — Z720 Tobacco use: Secondary | ICD-10-CM | POA: Diagnosis not present

## 2022-09-19 DIAGNOSIS — I63433 Cerebral infarction due to embolism of bilateral posterior cerebral arteries: Secondary | ICD-10-CM | POA: Diagnosis not present

## 2022-09-19 DIAGNOSIS — I10 Essential (primary) hypertension: Secondary | ICD-10-CM | POA: Diagnosis not present

## 2022-09-19 DIAGNOSIS — I739 Peripheral vascular disease, unspecified: Secondary | ICD-10-CM | POA: Diagnosis not present

## 2022-09-23 ENCOUNTER — Ambulatory Visit: Payer: Medicare HMO | Admitting: Nurse Practitioner

## 2022-09-30 ENCOUNTER — Other Ambulatory Visit: Payer: Self-pay | Admitting: Nurse Practitioner

## 2022-10-01 DIAGNOSIS — R200 Anesthesia of skin: Secondary | ICD-10-CM | POA: Diagnosis not present

## 2022-10-01 DIAGNOSIS — Z8673 Personal history of transient ischemic attack (TIA), and cerebral infarction without residual deficits: Secondary | ICD-10-CM | POA: Diagnosis not present

## 2022-10-01 DIAGNOSIS — F411 Generalized anxiety disorder: Secondary | ICD-10-CM | POA: Diagnosis not present

## 2022-10-01 DIAGNOSIS — R202 Paresthesia of skin: Secondary | ICD-10-CM | POA: Diagnosis not present

## 2022-10-01 DIAGNOSIS — Z8659 Personal history of other mental and behavioral disorders: Secondary | ICD-10-CM | POA: Diagnosis not present

## 2022-10-01 DIAGNOSIS — R2689 Other abnormalities of gait and mobility: Secondary | ICD-10-CM | POA: Diagnosis not present

## 2022-10-13 DIAGNOSIS — I639 Cerebral infarction, unspecified: Secondary | ICD-10-CM | POA: Diagnosis not present

## 2022-10-13 DIAGNOSIS — I6529 Occlusion and stenosis of unspecified carotid artery: Secondary | ICD-10-CM | POA: Diagnosis not present

## 2022-10-15 ENCOUNTER — Other Ambulatory Visit: Payer: Self-pay | Admitting: Nurse Practitioner

## 2022-10-15 DIAGNOSIS — M79672 Pain in left foot: Secondary | ICD-10-CM

## 2022-10-15 DIAGNOSIS — M79671 Pain in right foot: Secondary | ICD-10-CM

## 2022-10-17 ENCOUNTER — Telehealth: Payer: Self-pay | Admitting: Nurse Practitioner

## 2022-10-17 NOTE — Telephone Encounter (Signed)
Lvm to schedule follow up for med refills-Morgan Daniels

## 2022-10-21 ENCOUNTER — Telehealth: Payer: Self-pay | Admitting: Nurse Practitioner

## 2022-10-21 NOTE — Telephone Encounter (Signed)
Lvm and sent message to schedule appointment for med refills-tw

## 2022-10-23 ENCOUNTER — Ambulatory Visit (INDEPENDENT_AMBULATORY_CARE_PROVIDER_SITE_OTHER): Payer: Medicare HMO | Admitting: Nurse Practitioner

## 2022-10-23 ENCOUNTER — Encounter: Payer: Self-pay | Admitting: Nurse Practitioner

## 2022-10-23 VITALS — BP 135/85 | HR 90 | Temp 98.1°F | Resp 16 | Ht 67.0 in | Wt 192.2 lb

## 2022-10-23 DIAGNOSIS — Z760 Encounter for issue of repeat prescription: Secondary | ICD-10-CM

## 2022-10-23 DIAGNOSIS — N3001 Acute cystitis with hematuria: Secondary | ICD-10-CM | POA: Diagnosis not present

## 2022-10-23 DIAGNOSIS — E1142 Type 2 diabetes mellitus with diabetic polyneuropathy: Secondary | ICD-10-CM

## 2022-10-23 DIAGNOSIS — E1159 Type 2 diabetes mellitus with other circulatory complications: Secondary | ICD-10-CM | POA: Diagnosis not present

## 2022-10-23 DIAGNOSIS — E785 Hyperlipidemia, unspecified: Secondary | ICD-10-CM

## 2022-10-23 DIAGNOSIS — Z794 Long term (current) use of insulin: Secondary | ICD-10-CM | POA: Diagnosis not present

## 2022-10-23 DIAGNOSIS — E1169 Type 2 diabetes mellitus with other specified complication: Secondary | ICD-10-CM | POA: Diagnosis not present

## 2022-10-23 DIAGNOSIS — I152 Hypertension secondary to endocrine disorders: Secondary | ICD-10-CM

## 2022-10-23 DIAGNOSIS — I10 Essential (primary) hypertension: Secondary | ICD-10-CM

## 2022-10-23 DIAGNOSIS — F331 Major depressive disorder, recurrent, moderate: Secondary | ICD-10-CM | POA: Diagnosis not present

## 2022-10-23 MED ORDER — LOSARTAN POTASSIUM 25 MG PO TABS
25.0000 mg | ORAL_TABLET | Freq: Every day | ORAL | 3 refills | Status: DC
Start: 2022-10-23 — End: 2023-03-18

## 2022-10-23 MED ORDER — AMLODIPINE BESYLATE 10 MG PO TABS
10.0000 mg | ORAL_TABLET | Freq: Every day | ORAL | 1 refills | Status: DC
Start: 2022-10-23 — End: 2023-05-16

## 2022-10-23 MED ORDER — CIPROFLOXACIN HCL 500 MG PO TABS
500.0000 mg | ORAL_TABLET | Freq: Two times a day (BID) | ORAL | 0 refills | Status: AC
Start: 2022-10-23 — End: 2022-10-28

## 2022-10-23 MED ORDER — OMNIPOD 5 DEXG7G6 PODS GEN 5 MISC
1.0000 | 11 refills | Status: DC
Start: 2022-10-23 — End: 2022-11-24

## 2022-10-23 MED ORDER — INSULIN ASPART 100 UNIT/ML IJ SOLN
INTRAMUSCULAR | 5 refills | Status: DC
Start: 2022-10-23 — End: 2022-11-24

## 2022-10-23 MED ORDER — MIRTAZAPINE 7.5 MG PO TABS
7.5000 mg | ORAL_TABLET | Freq: Every day | ORAL | 3 refills | Status: DC
Start: 2022-10-23 — End: 2023-02-09

## 2022-10-23 MED ORDER — EMPAGLIFLOZIN 25 MG PO TABS: 25.0000 mg | ORAL_TABLET | Freq: Every day | ORAL | 3 refills | Status: DC

## 2022-10-23 NOTE — Progress Notes (Signed)
[facility] [address] [place], [place] [address]  Internal MEDICINE  Office Visit Note  Patient Name: Morgan Daniels  [id number]  [id number]  Date of Service: 10/23/2022  Chief Complaint  Patient presents with   Depression   Diabetes   Gastroesophageal Reflux   Hypertension   Hyperlipidemia   Follow-up    HPI Tabor presents for a follow-up visit for diabetes, hypertension, high cholesterol, depression, and symptoms of UTI.  Diabetes -- working on getting patient on omnipod insulin pump and dexcom. Was seeing endocrinology but her specialist retired. Has been uncontrolled and nonadherent to treatment regimen for a long time. Working on getting patient on a regimen she can maintain. Currently on insulin therapy but is not checking her sugars. Also taking glipizide and jardiance. Hypertension -- currently taking amlodipine, losartan, and metoprolol High cholesterol -- taking vascepa, gemfibrozil, and praluent injections UTI -- was unable to give urine for urinalysis/culture. Reports back pain, frequency, urgency, flank pain, pain with urinary, suprapubic pressure, and urinary discomfort/bladder spasms. Has a history recurrent UTIs and easily can become septic due to comorbid conditions.  Recurrent depression -- taking lexapro and mirtazapine     Current Medication: Outpatient Encounter Medications as of 10/23/2022  Medication Sig Note   Accu-Chek Softclix Lancets lancets Use 1 lancet before meals and at bed and prn to check glucose with glucose meter.    Alirocumab (PRALUENT) 150 MG/ML SOAJ Inject 1 mL (150 mg total) into the skin every 30 (thirty) days.    aspirin 81 MG chewable tablet Chew 1 tablet (81 mg total) by mouth daily.    Cholecalciferol (VITAMIN D-3) 125 MCG (5000 UT) TABS Take 5,000 Units by mouth daily.    [EXPIRED] ciprofloxacin (CIPRO) 500 MG tablet Take 1 tablet (500 mg total) by mouth 2 (two) times daily for 5 days. Take with food    clopidogrel  (PLAVIX) 75 MG tablet Take 75 mg by mouth daily. 02/01/2022: Patient states "don't think I'm still taking that"   gabapentin (NEURONTIN) 600 MG tablet Take 1 tablet (600 mg total) by mouth 3 (three) times daily.    gemfibrozil (LOPID) 600 MG tablet Take 600 mg by mouth 2 (two) times daily.    glucose blood (ACCU-CHEK GUIDE) test strip Use 1 test strip to check glucose before meals, at bedtime and prn with glucose meter    metoprolol succinate (TOPROL-XL) 50 MG 24 hr tablet TAKE 1 TABLET BY MOUTH EVERY DAY    mirtazapine (REMERON) 7.5 MG tablet Take 1 tablet (7.5 mg total) by mouth at bedtime.    pentoxifylline (TRENTAL) 400 MG CR tablet Take 400 mg by mouth 2 (two) times daily.    tiZANidine (ZANAFLEX) 4 MG tablet TAKE 1 TABLET BY MOUTH TWICE DAILY AS NEEDED FOR MUSCLE PAIN AND SPASMS    ULTICARE MICRO PEN NEEDLES 32G X 4 MM MISC TO USE WITH INSULIN DOSING THREE TIMES DAILY PRIOR TO MEALS AND AT BEDTIME FOR BASAL INSULIN.    VASCEPA 1 g capsule TAKE 1 CAPSULE BY MOUTH 2 TIMES DAILY    [DISCONTINUED] amLODipine (NORVASC) 10 MG tablet TAKE 1 TABLET BY MOUTH EVERY DAY    [DISCONTINUED] ciprofloxacin (CIPRO) 500 MG tablet Take 1 tablet (500 mg total) by mouth 2 (two) times daily.    [DISCONTINUED] Continuous Glucose Receiver (DEXCOM G6 RECEIVER) DEVI Use as indicated for uncontrolled dm    [DISCONTINUED] Continuous Glucose Sensor (DEXCOM G6 SENSOR) MISC Use 1 sensor every 10 days to check glucose levels.    [  DISCONTINUED] Continuous Glucose Transmitter (DEXCOM G6 TRANSMITTER) MISC USE AS INDICATED    [DISCONTINUED] dicyclomine (BENTYL) 10 MG capsule TAKE 1 CAPSULE BY MOUTH 3 TIMES DAILY BEFORE MEALS    [DISCONTINUED] empagliflozin (JARDIANCE) 25 MG TABS tablet Take 1 tablet (25 mg total) by mouth daily before breakfast.    [DISCONTINUED] FLUoxetine (PROZAC) 40 MG capsule TAKE 2 CAPSULES EVERY DAY    [DISCONTINUED] insulin aspart (NOVOLOG FLEXPEN) 100 UNIT/ML FlexPen Inject 20 Units into the skin 3  (three) times daily before meals. Needs appointment for further refills. (Patient taking differently: Inject 0-25 Units into the skin 3 (three) times daily before meals. Sliding scale)    [DISCONTINUED] insulin aspart (NOVOLOG) 100 UNIT/ML injection Use insulin with omnipod and change out every 72 hours. Max 24 hour dose: 100 units.    [DISCONTINUED] Insulin Disposable Pump (OMNIPOD 5 G6 PODS, GEN 5,) MISC 1 Device by Subcutaneous Infusion route every 3 (three) days.    [DISCONTINUED] Insulin Disposable Pump (OMNIPOD DASH PODS, GEN 4,) MISC 1 Device by Subcutaneous Infusion route every 3 (three) days.    [DISCONTINUED] losartan (COZAAR) 25 MG tablet Take 1 tablet (25 mg total) by mouth daily.    [DISCONTINUED] mirtazapine (REMERON) 15 MG tablet TAKE ONE TABLET BY MOUTH AT BEDTIME    [DISCONTINUED] rosuvastatin (CRESTOR) 40 MG tablet Take 1 tablet (40 mg total) by mouth daily. 02/01/2022: Patient states "don't think I'm still taking that"   [DISCONTINUED] traMADol (ULTRAM) 50 MG tablet TAKE 1 TABLET BY MOUTH THREE TIMES DAILYAS NEEDED FOR MODERATE OR SEVERE PAIN    [DISCONTINUED] TRESIBA FLEXTOUCH 200 UNIT/ML FlexTouch Pen INJECT 100 UNITS INTO THE SKIN AT BEDTIME. (Patient taking differently: Inject 130 Units into the skin at bedtime.)    amLODipine (NORVASC) 10 MG tablet Take 1 tablet (10 mg total) by mouth daily.    empagliflozin (JARDIANCE) 25 MG TABS tablet Take 1 tablet (25 mg total) by mouth daily before breakfast.    escitalopram (LEXAPRO) 10 MG tablet Take 10 mg by mouth daily.    losartan (COZAAR) 25 MG tablet Take 1 tablet (25 mg total) by mouth daily.    nitroGLYCERIN (NITRODUR - DOSED IN MG/24 HR) 0.2 mg/hr patch Place 1 patch (0.2 mg total) onto the skin daily.    [DISCONTINUED] insulin aspart (NOVOLOG) 100 UNIT/ML injection Use insulin with omnipod and change out every 72 hours. Max 24 hour dose: 100 units.    [DISCONTINUED] Insulin Disposable Pump (OMNIPOD 5 G6 PODS, GEN 5,) MISC 1  Device by Subcutaneous Infusion route every 3 (three) days.    No facility-administered encounter medications on file as of 10/23/2022.    Surgical History: Past Surgical History:  Procedure Laterality Date   ABDOMINAL HYSTERECTOMY  1990   BACK SURGERY  2000, 2004   CAROTID ENDARTERECTOMY Left 05/06/12   CARPAL TUNNEL RELEASE Left 07/18/2015   Procedure: CARPAL TUNNEL RELEASE;  Surgeon: Deeann Saint, MD;  Location: [facility] ORS;  Service: Orthopedics;  Laterality: Left;   CEREBRAL ANGIOGRAM Bilateral 05/03/2012   Procedure: CEREBRAL ANGIOGRAM;  Surgeon: Chuck Hint, MD;  Location: [facility] CATH LAB;  Service: Cardiovascular;  Laterality: Bilateral;   CHOLECYSTECTOMY  2001   COLONOSCOPY WITH PROPOFOL N/A 11/19/2018   Procedure: COLONOSCOPY WITH PROPOFOL;  Surgeon: Midge Minium, MD;  Location: [facility];  Service: Endoscopy;  Laterality: N/A;  Diabetic - insulin   CORONARY ARTERY BYPASS GRAFT N/A 01/18/2021   Procedure: CORONARY ARTERY BYPASS GRAFTING (CABG) x 4  USING LEFT INTERNAL MAMMARY ARTERY AND  LEFT ENDOSCOPIC GREATER SAPHENOUS VEIN CONDUITS;  Surgeon: Loreli Slot, MD;  Location: [facility] OR;  Service: Open Heart Surgery;  Laterality: N/A;   CORONARY/GRAFT ACUTE MI REVASCULARIZATION N/A 01/13/2021   Procedure: Coronary/Graft Acute MI Revascularization;  Surgeon: Marcina Millard, MD;  Location: [facility] INVASIVE CV LAB;  Service: Cardiovascular;  Laterality: N/A;   ENDARTERECTOMY Left 05/06/2012   Procedure: ENDARTERECTOMY CAROTID;  Surgeon: Chuck Hint, MD;  Location: [facility] OR;  Service: Vascular;  Laterality: Left;   ENDARTERECTOMY Right 08/09/2013   Procedure: ENDARTERECTOMY CAROTID-RIGHT;  Surgeon: Chuck Hint, MD;  Location: [facility] OR;  Service: Vascular;  Laterality: Right;   ENDOVEIN HARVEST OF GREATER SAPHENOUS VEIN Left 01/18/2021   Procedure: ENDOVEIN HARVEST OF GREATER SAPHENOUS VEIN;  Surgeon: Loreli Slot, MD;  Location: [facility] OR;  Service: Open  Heart Surgery;  Laterality: Left;   HERNIA REPAIR     IABP INSERTION Right 01/13/2021   Procedure: IABP Insertion;  Surgeon: Marcina Millard, MD;  Location: [facility] INVASIVE CV LAB;  Service: Cardiovascular;  Laterality: Right;   LEFT HEART CATH AND CORONARY ANGIOGRAPHY N/A 01/13/2021   Procedure: LEFT HEART CATH AND CORONARY ANGIOGRAPHY;  Surgeon: Marcina Millard, MD;  Location: [facility] INVASIVE CV LAB;  Service: Cardiovascular;  Laterality: N/A;   LOWER EXTREMITY ANGIOGRAPHY Left 01/03/2021   Procedure: LOWER EXTREMITY ANGIOGRAPHY;  Surgeon: Annice Needy, MD;  Location: [facility] INVASIVE CV LAB;  Service: Cardiovascular;  Laterality: Left;   LOWER EXTREMITY ANGIOGRAPHY Left 02/28/2021   Procedure: LOWER EXTREMITY ANGIOGRAPHY;  Surgeon: Annice Needy, MD;  Location: [facility] INVASIVE CV LAB;  Service: Cardiovascular;  Laterality: Left;   PATCH ANGIOPLASTY Left 05/06/2012   Procedure: WITH DACRON PATCH ANGIOPLASTY ;  Surgeon: Chuck Hint, MD;  Location: [facility] OR;  Service: Vascular;  Laterality: Left;   POLYPECTOMY  11/19/2018   Procedure: POLYPECTOMY;  Surgeon: Midge Minium, MD;  Location: [facility];  Service: Endoscopy;;   SPINE SURGERY  2004   TEE WITHOUT CARDIOVERSION N/A 01/18/2021   Procedure: TRANSESOPHAGEAL ECHOCARDIOGRAM (TEE);  Surgeon: Loreli Slot, MD;  Location: [facility] OR;  Service: Open Heart Surgery;  Laterality: N/A;   TONSILLECTOMY     TUBAL LIGATION      Medical History: Past Medical History:  Diagnosis Date   Anxiety    Arthritis    Bilateral carotid artery stenosis 2014   Carotid artery occlusion    Chronic kidney disease 06/18/2015   UTI   Chronic kidney disease 2017   Current smoker    CVA (cerebral vascular accident) (HCC) 2013   Depression    Diabetes (HCC)    Diverticulosis    Fatty liver    Fibromyalgia    GERD (gastroesophageal reflux disease)    H/O hiatal hernia    Heart attack (HCC) 01/12/2021   Hiatal hernia    Hypercholesteremia     Hypertension    IBS (irritable bowel syndrome)    PAD (peripheral artery disease) (HCC)    Peptic ulcer    Plantar fasciitis    Stroke (HCC) 01/31/2012   Right side-ministroke   T2DM (type 2 diabetes mellitus) (HCC)     Family History: Family History  Problem Relation Age of Onset   Hypertension Mother    Hyperlipidemia Mother    Deep vein thrombosis Mother    Cancer Father    Alcohol abuse Father    Heart failure Father    Hypertension Maternal Grandmother    Deep vein thrombosis Sister    Alcohol abuse  Sister    Alcohol abuse Sister     Social History   Socioeconomic History   Marital status: Married    Spouse name: Not on file   Number of children: Not on file   Years of education: Not on file   Highest education level: Not on file  Occupational History   Not on file  Tobacco Use   Smoking status: Former    Current packs/day: 1.00    Average packs/day: 1 pack/day for 52.1 years (52.1 ttl pk-yrs)    Types: Cigarettes    Start date: 11/09/1970    Passive exposure: Current   Smokeless tobacco: Never   Tobacco comments:       Vaping Use   Vaping status: Former  Substance and Sexual Activity   Alcohol use: Not Currently    Comment: rarely   Drug use: No   Sexual activity: Not Currently    Partners: Male  Other Topics Concern   Not on file  Social History Narrative   ** Merged History Encounter **       Social Determinants of Health   Financial Resource Strain: Not on file  Food Insecurity: Not on file  Transportation Needs: Not on file  Physical Activity: Not on file  Stress: Not on file  Social Connections: Not on file  Intimate Partner Violence: Not on file      Review of Systems  Constitutional:  Positive for fatigue. Negative for chills and unexpected weight change.  HENT: Negative.  Negative for congestion, rhinorrhea, sneezing and sore throat.   Eyes:  Negative for redness.  Respiratory: Negative.  Negative for cough, chest tightness,  shortness of breath and wheezing.   Cardiovascular: Negative.  Negative for chest pain and palpitations.  Gastrointestinal:  Negative for abdominal pain, constipation, diarrhea, nausea and vomiting.  Genitourinary:  Positive for difficulty urinating, dysuria, flank pain, frequency, hematuria, pelvic pain and urgency.  Musculoskeletal:  Positive for arthralgias and back pain. Negative for joint swelling and neck pain.  Skin:  Negative for rash.  Neurological: Negative.  Negative for tremors and numbness.  Hematological:  Negative for adenopathy. Does not bruise/bleed easily.  Psychiatric/Behavioral:  Positive for behavioral problems (Depression) and sleep disturbance. Negative for self-injury and suicidal ideas. The patient is nervous/anxious.     Vital Signs: BP 135/85 Comment: 144/90  Pulse 90   Temp 98.1 F (36.7 C)   Resp 16   Ht 5\' 7"  (1.702 m)   Wt 192 lb 3.2 oz (87.2 kg)   SpO2 95%   BMI 30.10 kg/m    Physical Exam Vitals reviewed.  Constitutional:      General: She is not in acute distress.    Appearance: Normal appearance. She is obese. She is not ill-appearing.  HENT:     Head: Normocephalic and atraumatic.  Eyes:     Pupils: Pupils are equal, round, and reactive to light.  Cardiovascular:     Rate and Rhythm: Normal rate and regular rhythm.  Pulmonary:     Effort: Pulmonary effort is normal. No respiratory distress.  Abdominal:     Tenderness: There is abdominal tenderness in the suprapubic area.  Neurological:     Mental Status: She is alert and oriented to person, place, and time.  Psychiatric:        Mood and Affect: Mood normal.        Behavior: Behavior normal.        Assessment/Plan: 1. Type 2 diabetes mellitus with other specified complication,  with long-term current use of insulin (HCC) Waiting to see if her insurance will cover omnipod and dexcom. Continue current insulin regimen, please check sugars at least once daily. Continue glipizide and  jardiance. Declined A1c check. - empagliflozin (JARDIANCE) 25 MG TABS tablet; Take 1 tablet (25 mg total) by mouth daily before breakfast.  Dispense: 30 tablet; Refill: 3  2. Hyperlipidemia associated with type 2 diabetes mellitus (HCC) Continue gemfibrozil, vascepa, and praluent as prescribed, refill ordered  - gemfibrozil (LOPID) 600 MG tablet; Take 600 mg by mouth 2 (two) times daily.  3. Hypertension associated with type 2 diabetes mellitus (HCC) Continue amlodipine, losartan and metoprolol as prescribed, refills ordered  - amLODipine (NORVASC) 10 MG tablet; Take 1 tablet (10 mg total) by mouth daily.  Dispense: 90 tablet; Refill: 1 - losartan (COZAAR) 25 MG tablet; Take 1 tablet (25 mg total) by mouth daily.  Dispense: 90 tablet; Refill: 3  4. Acute cystitis with hematuria Unable to send urine sample for urinalysis/culture. Treatment sent anyway due to significant symptoms of UTI. Take cipro for 5 days, take until gone  - ciprofloxacin (CIPRO) 500 MG tablet; Take 1 tablet (500 mg total) by mouth 2 (two) times daily for 5 days. Take with food  Dispense: 10 tablet; Refill: 0  5. Moderate episode of recurrent major depressive disorder (HCC) Continue lexapro and mirtazapine as prescribed.  - escitalopram (LEXAPRO) 10 MG tablet; Take 10 mg by mouth daily. - mirtazapine (REMERON) 7.5 MG tablet; Take 1 tablet (7.5 mg total) by mouth at bedtime.  Dispense: 30 tablet; Refill: 3   General Counseling: Kinlynn verbalizes understanding of the findings of todays visit and agrees with plan of treatment. I have discussed any further diagnostic evaluation that may be needed or ordered today. We also reviewed her medications today. she has been encouraged to call the office with any questions or concerns that should arise related to todays visit.    No orders of the defined types were placed in this encounter.   Meds ordered this encounter  Medications   DISCONTD: Insulin Disposable Pump (OMNIPOD 5  G6 PODS, GEN 5,) MISC    Sig: 1 Device by Subcutaneous Infusion route every 3 (three) days.    Dispense:  10 each    Refill:  11    Dx code E11.65   amLODipine (NORVASC) 10 MG tablet    Sig: Take 1 tablet (10 mg total) by mouth daily.    Dispense:  90 tablet    Refill:  1    FOR FUTRURE REFILLS **BUBBLE PACK PT**   empagliflozin (JARDIANCE) 25 MG TABS tablet    Sig: Take 1 tablet (25 mg total) by mouth daily before breakfast.    Dispense:  30 tablet    Refill:  3    Discontinue farxiga, fill jardiance instead   losartan (COZAAR) 25 MG tablet    Sig: Take 1 tablet (25 mg total) by mouth daily.    Dispense:  90 tablet    Refill:  3   mirtazapine (REMERON) 7.5 MG tablet    Sig: Take 1 tablet (7.5 mg total) by mouth at bedtime.    Dispense:  30 tablet    Refill:  3    Patient will call when she needs this filled, she will cut the 15 mg in half until she runs out.   DISCONTD: insulin aspart (NOVOLOG) 100 UNIT/ML injection    Sig: Use insulin with omnipod and change out every 72 hours. Max 24 hour  dose: 100 units.    Dispense:  10 mL    Refill:  5   ciprofloxacin (CIPRO) 500 MG tablet    Sig: Take 1 tablet (500 mg total) by mouth 2 (two) times daily for 5 days. Take with food    Dispense:  10 tablet    Refill:  0    Return in about 1 month (around 11/22/2022) for F/U, Alleen Kehm PCP omnipod, sugars .   Total time spent:30 Minutes Time spent includes review of chart, medications, test results, and follow up plan with the patient.   [place] Controlled Substance Database was reviewed by me.  This patient was seen by Sallyanne Kuster, FNP-C in collaboration with Dr. Beverely Risen as a part of collaborative care agreement.   Shayona Hibbitts R. Tedd Sias, MSN, FNP-C Internal medicine

## 2022-10-24 ENCOUNTER — Other Ambulatory Visit: Payer: Self-pay

## 2022-11-03 ENCOUNTER — Other Ambulatory Visit: Payer: Self-pay

## 2022-11-03 MED ORDER — FLUCONAZOLE 150 MG PO TABS: ORAL_TABLET | ORAL | 0 refills | Status: DC

## 2022-11-03 NOTE — Telephone Encounter (Signed)
Pt called need diflucan for yeast infection as per alyssa diflucan sent

## 2022-11-10 DIAGNOSIS — R200 Anesthesia of skin: Secondary | ICD-10-CM | POA: Diagnosis not present

## 2022-11-10 DIAGNOSIS — R202 Paresthesia of skin: Secondary | ICD-10-CM | POA: Diagnosis not present

## 2022-11-13 ENCOUNTER — Other Ambulatory Visit: Payer: Self-pay | Admitting: Nurse Practitioner

## 2022-11-13 DIAGNOSIS — Z760 Encounter for issue of repeat prescription: Secondary | ICD-10-CM

## 2022-11-13 DIAGNOSIS — K580 Irritable bowel syndrome with diarrhea: Secondary | ICD-10-CM

## 2022-11-13 DIAGNOSIS — M79671 Pain in right foot: Secondary | ICD-10-CM

## 2022-11-13 DIAGNOSIS — M79672 Pain in left foot: Secondary | ICD-10-CM

## 2022-11-19 ENCOUNTER — Other Ambulatory Visit: Payer: Self-pay | Admitting: Nurse Practitioner

## 2022-11-19 DIAGNOSIS — M79671 Pain in right foot: Secondary | ICD-10-CM

## 2022-11-19 DIAGNOSIS — M79672 Pain in left foot: Secondary | ICD-10-CM

## 2022-11-24 ENCOUNTER — Ambulatory Visit: Payer: Medicare HMO | Admitting: Nurse Practitioner

## 2022-11-24 ENCOUNTER — Encounter: Payer: Self-pay | Admitting: Nurse Practitioner

## 2022-11-24 VITALS — BP 132/82 | HR 86 | Temp 98.2°F | Resp 16 | Ht 67.0 in | Wt 187.0 lb

## 2022-11-24 DIAGNOSIS — Z23 Encounter for immunization: Secondary | ICD-10-CM | POA: Diagnosis not present

## 2022-11-24 DIAGNOSIS — M79672 Pain in left foot: Secondary | ICD-10-CM | POA: Diagnosis not present

## 2022-11-24 DIAGNOSIS — M79671 Pain in right foot: Secondary | ICD-10-CM

## 2022-11-24 DIAGNOSIS — Z794 Long term (current) use of insulin: Secondary | ICD-10-CM

## 2022-11-24 DIAGNOSIS — E1142 Type 2 diabetes mellitus with diabetic polyneuropathy: Secondary | ICD-10-CM | POA: Diagnosis not present

## 2022-11-24 LAB — POCT GLYCOSYLATED HEMOGLOBIN (HGB A1C): Hemoglobin A1C: 12.9 % — AB (ref 4.0–5.6)

## 2022-11-24 MED ORDER — TRAMADOL HCL 50 MG PO TABS: ORAL_TABLET | ORAL | 0 refills | Status: DC

## 2022-11-24 MED ORDER — INSULIN ASPART (W/NIACINAMIDE) 100 UNIT/ML ~~LOC~~ SOPN: PEN_INJECTOR | SUBCUTANEOUS | 3 refills | Status: DC

## 2022-11-24 MED ORDER — GLIPIZIDE ER 5 MG PO TB24: 5.0000 mg | ORAL_TABLET | Freq: Every day | ORAL | 1 refills | Status: DC

## 2022-11-24 MED ORDER — TRESIBA FLEXTOUCH 200 UNIT/ML ~~LOC~~ SOPN
20.0000 [IU] | PEN_INJECTOR | Freq: Every day | SUBCUTANEOUS | 3 refills | Status: DC
Start: 2022-11-24 — End: 2022-12-02

## 2022-11-24 NOTE — Progress Notes (Signed)
[facility] [address] [place], [place] [address]  Internal MEDICINE  Office Visit Note  Patient Name: Morgan Daniels  [id number]  [id number]  Date of Service: 11/24/2022  Chief Complaint  Patient presents with   Depression   Diabetes   Gastroesophageal Reflux   Hypertension   Hyperlipidemia   Follow-up    HPI Morgan Daniels presents for a follow-up visit for diabetes.  Decreasing cravings for sugar Has not been checking glucose  Has not been taking tresiba, only novolog Currently Taking jardiance  Requesting flu vaccine Foot pain in both heals, taking tramadol as needed for this.    Current Medication: Outpatient Encounter Medications as of 11/24/2022  Medication Sig Note   Accu-Chek Softclix Lancets lancets Use 1 lancet before meals and at bed and prn to check glucose with glucose meter.    Alirocumab (PRALUENT) 150 MG/ML SOAJ Inject 1 mL (150 mg total) into the skin every 30 (thirty) days.    amLODipine (NORVASC) 10 MG tablet Take 1 tablet (10 mg total) by mouth daily.    aspirin 81 MG chewable tablet Chew 1 tablet (81 mg total) by mouth daily.    Cholecalciferol (VITAMIN D-3) 125 MCG (5000 UT) TABS Take 5,000 Units by mouth daily.    clopidogrel (PLAVIX) 75 MG tablet Take 75 mg by mouth daily. 02/01/2022: Patient states "don't think I'm still taking that"   dicyclomine (BENTYL) 10 MG capsule TAKE 1 CAPSULE BY MOUTH THREE TIMES DAILY BEFORE MEALS    empagliflozin (JARDIANCE) 25 MG TABS tablet Take 1 tablet (25 mg total) by mouth daily before breakfast.    escitalopram (LEXAPRO) 10 MG tablet Take 10 mg by mouth daily.    gabapentin (NEURONTIN) 600 MG tablet Take 1 tablet (600 mg total) by mouth 3 (three) times daily.    gemfibrozil (LOPID) 600 MG tablet Take 600 mg by mouth 2 (two) times daily.    glipiZIDE (GLUCOTROL XL) 5 MG 24 hr tablet Take 1 tablet (5 mg total) by mouth daily with breakfast.    glucose blood (ACCU-CHEK GUIDE) test strip Use 1 test strip to  check glucose before meals, at bedtime and prn with glucose meter    insulin aspart (FIASP) 100 UNIT/ML FlexTouch Pen Inject insulin into skin 3 times daily with meals per sliding scale provided to patient. Max dose: 63 units per 24 hours    losartan (COZAAR) 25 MG tablet Take 1 tablet (25 mg total) by mouth daily.    metoprolol succinate (TOPROL-XL) 50 MG 24 hr tablet TAKE 1 TABLET BY MOUTH EVERY DAY    mirtazapine (REMERON) 7.5 MG tablet Take 1 tablet (7.5 mg total) by mouth at bedtime.    pentoxifylline (TRENTAL) 400 MG CR tablet Take 400 mg by mouth 2 (two) times daily.    rosuvastatin (CRESTOR) 40 MG tablet TAKE 1 TABLET BY MOUTH EVERYDAY    tiZANidine (ZANAFLEX) 4 MG tablet TAKE 1 TABLET BY MOUTH TWICE DAILY AS NEEDED FOR MUSCLE PAIN AND SPASMS    ULTICARE MICRO PEN NEEDLES 32G X 4 MM MISC TO USE WITH INSULIN DOSING THREE TIMES DAILY PRIOR TO MEALS AND AT BEDTIME FOR BASAL INSULIN.    [DISCONTINUED] Continuous Glucose Receiver (DEXCOM G6 RECEIVER) DEVI Use as indicated for uncontrolled dm    [DISCONTINUED] Continuous Glucose Sensor (DEXCOM G6 SENSOR) MISC Use 1 sensor every 10 days to check glucose levels.    [DISCONTINUED] Continuous Glucose Transmitter (DEXCOM G6 TRANSMITTER) MISC USE AS INDICATED    [DISCONTINUED] fluconazole (DIFLUCAN)  150 MG tablet Take 1 tab po once  may repeat 3 days if symptoms persist    [DISCONTINUED] insulin aspart (NOVOLOG) 100 UNIT/ML injection Use insulin with omnipod and change out every 72 hours. Max 24 hour dose: 100 units.    [DISCONTINUED] Insulin Disposable Pump (OMNIPOD 5 G6 PODS, GEN 5,) MISC 1 Device by Subcutaneous Infusion route every 3 (three) days.    [DISCONTINUED] traMADol (ULTRAM) 50 MG tablet TAKE 1 TABLET BY MOUTH THREE TIMES DAILYAS NEEDED FOR MODERATE OR SEVERE PAIN    [DISCONTINUED] TRESIBA FLEXTOUCH 200 UNIT/ML FlexTouch Pen INJECT 100 UNITS INTO THE SKIN AT BEDTIME. (Patient taking differently: Inject 130 Units into the skin at bedtime.)     [DISCONTINUED] VASCEPA 1 g capsule TAKE 1 CAPSULE BY MOUTH 2 TIMES DAILY    nitroGLYCERIN (NITRODUR - DOSED IN MG/24 HR) 0.2 mg/hr patch Place 1 patch (0.2 mg total) onto the skin daily.    traMADol (ULTRAM) 50 MG tablet TAKE 1 TABLET BY MOUTH THREE TIMES DAILYAS NEEDED FOR MODERATE OR SEVERE PAIN    [DISCONTINUED] insulin degludec (TRESIBA FLEXTOUCH) 200 UNIT/ML FlexTouch Pen Inject 20 Units into the skin at bedtime. Increase by 4 units every 4 days if AM sugar >200. (Patient taking differently: Inject 20 Units into the skin at bedtime. Increase by 4 units every 4 days if AM sugar >200.maximum 120 units)    No facility-administered encounter medications on file as of 11/24/2022.    Surgical History: Past Surgical History:  Procedure Laterality Date   ABDOMINAL HYSTERECTOMY  1990   BACK SURGERY  2000, 2004   CAROTID ENDARTERECTOMY Left 05/06/12   CARPAL TUNNEL RELEASE Left 07/18/2015   Procedure: CARPAL TUNNEL RELEASE;  Surgeon: Deeann Saint, MD;  Location: [facility] ORS;  Service: Orthopedics;  Laterality: Left;   CEREBRAL ANGIOGRAM Bilateral 05/03/2012   Procedure: CEREBRAL ANGIOGRAM;  Surgeon: Chuck Hint, MD;  Location: [facility] CATH LAB;  Service: Cardiovascular;  Laterality: Bilateral;   CHOLECYSTECTOMY  2001   COLONOSCOPY WITH PROPOFOL N/A 11/19/2018   Procedure: COLONOSCOPY WITH PROPOFOL;  Surgeon: Midge Minium, MD;  Location: [facility] SURGERY CNTR;  Service: Endoscopy;  Laterality: N/A;  Diabetic - insulin   CORONARY ARTERY BYPASS GRAFT N/A 01/18/2021   Procedure: CORONARY ARTERY BYPASS GRAFTING (CABG) x 4  USING LEFT INTERNAL MAMMARY ARTERY AND LEFT ENDOSCOPIC GREATER SAPHENOUS VEIN CONDUITS;  Surgeon: Loreli Slot, MD;  Location: [facility] OR;  Service: Open Heart Surgery;  Laterality: N/A;   CORONARY/GRAFT ACUTE MI REVASCULARIZATION N/A 01/13/2021   Procedure: Coronary/Graft Acute MI Revascularization;  Surgeon: Marcina Millard, MD;  Location: [facility] INVASIVE CV LAB;  Service:  Cardiovascular;  Laterality: N/A;   ENDARTERECTOMY Left 05/06/2012   Procedure: ENDARTERECTOMY CAROTID;  Surgeon: Chuck Hint, MD;  Location: [facility] OR;  Service: Vascular;  Laterality: Left;   ENDARTERECTOMY Right 08/09/2013   Procedure: ENDARTERECTOMY CAROTID-RIGHT;  Surgeon: Chuck Hint, MD;  Location: [facility] OR;  Service: Vascular;  Laterality: Right;   ENDOVEIN HARVEST OF GREATER SAPHENOUS VEIN Left 01/18/2021   Procedure: ENDOVEIN HARVEST OF GREATER SAPHENOUS VEIN;  Surgeon: Loreli Slot, MD;  Location: [facility] OR;  Service: Open Heart Surgery;  Laterality: Left;   HERNIA REPAIR     IABP INSERTION Right 01/13/2021   Procedure: IABP Insertion;  Surgeon: Marcina Millard, MD;  Location: [facility] INVASIVE CV LAB;  Service: Cardiovascular;  Laterality: Right;   LEFT HEART CATH AND CORONARY ANGIOGRAPHY N/A 01/13/2021   Procedure: LEFT HEART CATH AND CORONARY ANGIOGRAPHY;  Surgeon: Marcina Millard,  MD;  Location: [facility] INVASIVE CV LAB;  Service: Cardiovascular;  Laterality: N/A;   LOWER EXTREMITY ANGIOGRAPHY Left 01/03/2021   Procedure: LOWER EXTREMITY ANGIOGRAPHY;  Surgeon: Annice Needy, MD;  Location: [facility] INVASIVE CV LAB;  Service: Cardiovascular;  Laterality: Left;   LOWER EXTREMITY ANGIOGRAPHY Left 02/28/2021   Procedure: LOWER EXTREMITY ANGIOGRAPHY;  Surgeon: Annice Needy, MD;  Location: [facility] INVASIVE CV LAB;  Service: Cardiovascular;  Laterality: Left;   PATCH ANGIOPLASTY Left 05/06/2012   Procedure: WITH DACRON PATCH ANGIOPLASTY ;  Surgeon: Chuck Hint, MD;  Location: [facility] OR;  Service: Vascular;  Laterality: Left;   POLYPECTOMY  11/19/2018   Procedure: POLYPECTOMY;  Surgeon: Midge Minium, MD;  Location: [facility] SURGERY [facility]TR;  Service: Endoscopy;;   SPINE SURGERY  2004   TEE WITHOUT CARDIOVERSION N/A 01/18/2021   Procedure: TRANSESOPHAGEAL ECHOCARDIOGRAM (TEE);  Surgeon: Loreli Slot, MD;  Location: [facility] OR;  Service: Open Heart Surgery;  Laterality: N/A;    TONSILLECTOMY     TUBAL LIGATION      Medical History: Past Medical History:  Diagnosis Date   Anxiety    Arthritis    Bilateral carotid artery stenosis 2014   Carotid artery occlusion    Chronic kidney disease 06/18/2015   UTI   Chronic kidney disease 2017   Current smoker    CVA (cerebral vascular accident) (HCC) 2013   Depression    Diabetes (HCC)    Diverticulosis    Fatty liver    Fibromyalgia    GERD (gastroesophageal reflux disease)    H/O hiatal hernia    Heart attack (HCC) 01/12/2021   Hiatal hernia    Hypercholesteremia    Hypertension    IBS (irritable bowel syndrome)    PAD (peripheral artery disease) (HCC)    Peptic ulcer    Plantar fasciitis    Stroke (HCC) 01/31/2012   Right side-ministroke   T2DM (type 2 diabetes mellitus) (HCC)     Family History: Family History  Problem Relation Age of Onset   Hypertension Mother    Hyperlipidemia Mother    Deep vein thrombosis Mother    Cancer Father    Alcohol abuse Father    Heart failure Father    Hypertension Maternal Grandmother    Deep vein thrombosis Sister    Alcohol abuse Sister    Alcohol abuse Sister     Social History   Socioeconomic History   Marital status: Married    Spouse name: Not on file   Number of children: Not on file   Years of education: Not on file   Highest education level: Not on file  Occupational History   Not on file  Tobacco Use   Smoking status: Former    Current packs/day: 1.00    Average packs/day: 1 pack/day for 52.2 years (52.2 ttl pk-yrs)    Types: Cigarettes    Start date: 11/09/1970    Passive exposure: Current   Smokeless tobacco: Never   Tobacco comments:       Vaping Use   Vaping status: Former  Substance and Sexual Activity   Alcohol use: Not Currently    Comment: rarely   Drug use: No   Sexual activity: Not Currently    Partners: Male  Other Topics Concern   Not on file  Social History Narrative   ** Merged History Encounter **        Social Determinants of Health   Financial Resource Strain: Not on file  Food Insecurity: Not  on file  Transportation Needs: Not on file  Physical Activity: Not on file  Stress: Not on file  Social Connections: Not on file  Intimate Partner Violence: Not on file      Review of Systems  Constitutional:  Positive for fatigue. Negative for chills and unexpected weight change.  HENT: Negative.  Negative for congestion, rhinorrhea, sneezing and sore throat.   Eyes:  Negative for redness.  Respiratory: Negative.  Negative for cough, chest tightness, shortness of breath and wheezing.   Cardiovascular: Negative.  Negative for chest pain and palpitations.  Gastrointestinal:  Negative for abdominal pain, constipation, diarrhea, nausea and vomiting.  Genitourinary:  Positive for urgency. Negative for difficulty urinating, dysuria, flank pain, frequency, hematuria and pelvic pain.  Musculoskeletal:  Positive for arthralgias and back pain. Negative for joint swelling and neck pain.  Skin:  Negative for rash.  Neurological: Negative.  Negative for tremors and numbness.  Hematological:  Negative for adenopathy. Does not bruise/bleed easily.  Psychiatric/Behavioral:  Positive for behavioral problems (Depression) and sleep disturbance. Negative for self-injury and suicidal ideas. The patient is nervous/anxious.     Vital Signs: BP 132/82 Comment: 148/82  Pulse 86   Temp 98.2 F (36.8 C)   Resp 16   Ht 5\' 7"  (1.702 m)   Wt 187 lb (84.8 kg)   SpO2 95%   BMI 29.29 kg/m    Physical Exam Vitals reviewed.  Constitutional:      Appearance: Normal appearance.  HENT:     Head: Normocephalic and atraumatic.  Eyes:     Pupils: Pupils are equal, round, and reactive to light.  Cardiovascular:     Rate and Rhythm: Normal rate and regular rhythm.  Pulmonary:     Effort: Pulmonary effort is normal. No respiratory distress.  Neurological:     Mental Status: She is alert and oriented to person,  place, and time.  Psychiatric:        Mood and Affect: Mood normal.        Behavior: Behavior normal.        Assessment/Plan: 1. Type 2 diabetes mellitus with diabetic polyneuropathy, with long-term current use of insulin (HCC) Restart tresiba 20 units daily Novolog for meals via sliding scale.  Check glucose before meals and bedtime.  Glipizide XL 5 mg daily.  - POCT glycosylated hemoglobin (Hb A1C) - insulin aspart (FIASP) 100 UNIT/ML FlexTouch Pen; Inject insulin into skin 3 times daily with meals per sliding scale provided to patient. Max dose: 63 units per 24 hours  Dispense: 15 mL; Refill: 3 - glipiZIDE (GLUCOTROL XL) 5 MG 24 hr tablet; Take 1 tablet (5 mg total) by mouth daily with breakfast.  Dispense: 90 tablet; Refill: 1  2. Inflammatory pain of right heel Tramadol refills ordered, continue prn as prescribed. Sees podiatry - traMADol (ULTRAM) 50 MG tablet; TAKE 1 TABLET BY MOUTH THREE TIMES DAILYAS NEEDED FOR MODERATE OR SEVERE PAIN  Dispense: 60 tablet; Refill: 0  3. Inflammatory pain of left heel Tramadol refills ordered, continue prn as prescribed. Sees podiatry - traMADol (ULTRAM) 50 MG tablet; TAKE 1 TABLET BY MOUTH THREE TIMES DAILYAS NEEDED FOR MODERATE OR SEVERE PAIN  Dispense: 60 tablet; Refill: 0  4. Need for vaccination Flu vaccine administered in office today - Influenza, MDCK, trivalent, PF(Flucelvax egg-free)   General Counseling: nylaya corprew understanding of the findings of todays visit and agrees with plan of treatment. I have discussed any further diagnostic evaluation that may be needed or ordered today. We  also reviewed her medications today. she has been encouraged to call the office with any questions or concerns that should arise related to todays visit.    Orders Placed This Encounter  Procedures   Influenza, MDCK, trivalent, PF(Flucelvax egg-free)   POCT glycosylated hemoglobin (Hb A1C)    Meds ordered this encounter  Medications    DISCONTD: insulin degludec (TRESIBA FLEXTOUCH) 200 UNIT/ML FlexTouch Pen    Sig: Inject 20 Units into the skin at bedtime. Increase by 4 units every 4 days if AM sugar >200.    Dispense:  15 mL    Refill:  3    Fill new script today   insulin aspart (FIASP) 100 UNIT/ML FlexTouch Pen    Sig: Inject insulin into skin 3 times daily with meals per sliding scale provided to patient. Max dose: 63 units per 24 hours    Dispense:  15 mL    Refill:  3   glipiZIDE (GLUCOTROL XL) 5 MG 24 hr tablet    Sig: Take 1 tablet (5 mg total) by mouth daily with breakfast.    Dispense:  90 tablet    Refill:  1    Start new med tomorrow.  please fill asap   traMADol (ULTRAM) 50 MG tablet    Sig: TAKE 1 TABLET BY MOUTH THREE TIMES DAILYAS NEEDED FOR MODERATE OR SEVERE PAIN    Dispense:  60 tablet    Refill:  0    FOR FUTURE REFILLS **BUBBLE PACK PT**    Return in about 1 week (around 12/01/2022) for F/U, Brittaney Beaulieu PCP sugars .   Total time spent:30 Minutes Time spent includes review of chart, medications, test results, and follow up plan with the patient.   [place] Controlled Substance Database was reviewed by me.  This patient was seen by Sallyanne Kuster, FNP-C in collaboration with Dr. Beverely Risen as a part of collaborative care agreement.   Ona Roehrs R. Tedd Sias, MSN, FNP-C Internal medicine

## 2022-12-02 ENCOUNTER — Encounter: Payer: Self-pay | Admitting: Nurse Practitioner

## 2022-12-02 ENCOUNTER — Telehealth (INDEPENDENT_AMBULATORY_CARE_PROVIDER_SITE_OTHER): Payer: Medicare HMO | Admitting: Nurse Practitioner

## 2022-12-02 VITALS — Resp 16 | Ht 67.0 in | Wt 183.0 lb

## 2022-12-02 DIAGNOSIS — E1142 Type 2 diabetes mellitus with diabetic polyneuropathy: Secondary | ICD-10-CM

## 2022-12-02 DIAGNOSIS — Z794 Long term (current) use of insulin: Secondary | ICD-10-CM

## 2022-12-02 MED ORDER — TRESIBA FLEXTOUCH 200 UNIT/ML ~~LOC~~ SOPN
40.0000 [IU] | PEN_INJECTOR | Freq: Every day | SUBCUTANEOUS | Status: AC
Start: 2022-12-02 — End: ?

## 2022-12-02 NOTE — Progress Notes (Signed)
[facility] [address] [place], [place] [address]  Internal MEDICINE  Telephone Visit  Patient Name: Morgan Daniels  [id number]  [id number]  Date of Service: 12/02/2022  I connected with the patient at 1630 by telephone and verified the patients identity using two identifiers.   I discussed the limitations, risks, security and privacy concerns of performing an evaluation and management service by telephone and the availability of in person appointments. I also discussed with the patient that there may be a patient responsible charge related to the service.  The patient expressed understanding and agrees to proceed.    Chief Complaint  Patient presents with   Telephone Screen    F/u sugars   Telephone Assessment    HPI Earnstine presents for a telehealth virtual visit for diabetes  Recently changed insulin regimen and management of sugars  Still administering 20 units of tresiba at bedtime and increased her bolus doses by a couple of units.  Also taking jardiance and glipizide Glucose has been Under 300 which is an improvement. Range is  260s-280s  Current Medication: Outpatient Encounter Medications as of 12/02/2022  Medication Sig Note   Accu-Chek Softclix Lancets lancets Use 1 lancet before meals and at bed and prn to check glucose with glucose meter.    Alirocumab (PRALUENT) 150 MG/ML SOAJ Inject 1 mL (150 mg total) into the skin every 30 (thirty) days.    amLODipine (NORVASC) 10 MG tablet Take 1 tablet (10 mg total) by mouth daily.    aspirin 81 MG chewable tablet Chew 1 tablet (81 mg total) by mouth daily.    Cholecalciferol (VITAMIN D-3) 125 MCG (5000 UT) TABS Take 5,000 Units by mouth daily.    clopidogrel (PLAVIX) 75 MG tablet Take 75 mg by mouth daily. 02/01/2022: Patient states "don't think I'm still taking that"   dicyclomine (BENTYL) 10 MG capsule TAKE 1 CAPSULE BY MOUTH THREE TIMES DAILY BEFORE MEALS    empagliflozin (JARDIANCE) 25 MG TABS tablet Take 1  tablet (25 mg total) by mouth daily before breakfast.    escitalopram (LEXAPRO) 10 MG tablet Take 10 mg by mouth daily.    gabapentin (NEURONTIN) 600 MG tablet Take 1 tablet (600 mg total) by mouth 3 (three) times daily.    gemfibrozil (LOPID) 600 MG tablet Take 600 mg by mouth 2 (two) times daily.    glipiZIDE (GLUCOTROL XL) 5 MG 24 hr tablet Take 1 tablet (5 mg total) by mouth daily with breakfast.    glucose blood (ACCU-CHEK GUIDE) test strip Use 1 test strip to check glucose before meals, at bedtime and prn with glucose meter    insulin aspart (FIASP) 100 UNIT/ML FlexTouch Pen Inject insulin into skin 3 times daily with meals per sliding scale provided to patient. Max dose: 63 units per 24 hours    losartan (COZAAR) 25 MG tablet Take 1 tablet (25 mg total) by mouth daily.    metoprolol succinate (TOPROL-XL) 50 MG 24 hr tablet TAKE 1 TABLET BY MOUTH EVERY DAY    mirtazapine (REMERON) 7.5 MG tablet Take 1 tablet (7.5 mg total) by mouth at bedtime.    pentoxifylline (TRENTAL) 400 MG CR tablet Take 400 mg by mouth 2 (two) times daily.    rosuvastatin (CRESTOR) 40 MG tablet TAKE 1 TABLET BY MOUTH EVERYDAY    tiZANidine (ZANAFLEX) 4 MG tablet TAKE 1 TABLET BY MOUTH TWICE DAILY AS NEEDED FOR MUSCLE PAIN AND SPASMS    traMADol (ULTRAM) 50 MG tablet TAKE 1  TABLET BY MOUTH THREE TIMES DAILYAS NEEDED FOR MODERATE OR SEVERE PAIN    ULTICARE MICRO PEN NEEDLES 32G X 4 MM MISC TO USE WITH INSULIN DOSING THREE TIMES DAILY PRIOR TO MEALS AND AT BEDTIME FOR BASAL INSULIN.    VASCEPA 1 g capsule TAKE 1 CAPSULE BY MOUTH 2 TIMES DAILY    [DISCONTINUED] insulin degludec (TRESIBA FLEXTOUCH) 200 UNIT/ML FlexTouch Pen Inject 20 Units into the skin at bedtime. Increase by 4 units every 4 days if AM sugar >200. (Patient taking differently: Inject 20 Units into the skin at bedtime. Increase by 4 units every 4 days if AM sugar >200.maximum 120 units)    insulin degludec (TRESIBA FLEXTOUCH) 200 UNIT/ML FlexTouch Pen Inject  40 Units into the skin at bedtime. Increase by 4 units every 4 days if AM sugar >200.    nitroGLYCERIN (NITRODUR - DOSED IN MG/24 HR) 0.2 mg/hr patch Place 1 patch (0.2 mg total) onto the skin daily.    No facility-administered encounter medications on file as of 12/02/2022.    Surgical History: Past Surgical History:  Procedure Laterality Date   ABDOMINAL HYSTERECTOMY  1990   BACK SURGERY  2000, 2004   CAROTID ENDARTERECTOMY Left 05/06/12   CARPAL TUNNEL RELEASE Left 07/18/2015   Procedure: CARPAL TUNNEL RELEASE;  Surgeon: Deeann Saint, MD;  Location: [facility] ORS;  Service: Orthopedics;  Laterality: Left;   CEREBRAL ANGIOGRAM Bilateral 05/03/2012   Procedure: CEREBRAL ANGIOGRAM;  Surgeon: Chuck Hint, MD;  Location: [facility] CATH LAB;  Service: Cardiovascular;  Laterality: Bilateral;   CHOLECYSTECTOMY  2001   COLONOSCOPY WITH PROPOFOL N/A 11/19/2018   Procedure: COLONOSCOPY WITH PROPOFOL;  Surgeon: Midge Minium, MD;  Location: [facility];  Service: Endoscopy;  Laterality: N/A;  Diabetic - insulin   CORONARY ARTERY BYPASS GRAFT N/A 01/18/2021   Procedure: CORONARY ARTERY BYPASS GRAFTING (CABG) x 4  USING LEFT INTERNAL MAMMARY ARTERY AND LEFT ENDOSCOPIC GREATER SAPHENOUS VEIN CONDUITS;  Surgeon: Loreli Slot, MD;  Location: [facility] OR;  Service: Open Heart Surgery;  Laterality: N/A;   CORONARY/GRAFT ACUTE MI REVASCULARIZATION N/A 01/13/2021   Procedure: Coronary/Graft Acute MI Revascularization;  Surgeon: Marcina Millard, MD;  Location: [facility] INVASIVE CV LAB;  Service: Cardiovascular;  Laterality: N/A;   ENDARTERECTOMY Left 05/06/2012   Procedure: ENDARTERECTOMY CAROTID;  Surgeon: Chuck Hint, MD;  Location: [facility] OR;  Service: Vascular;  Laterality: Left;   ENDARTERECTOMY Right 08/09/2013   Procedure: ENDARTERECTOMY CAROTID-RIGHT;  Surgeon: Chuck Hint, MD;  Location: [facility] OR;  Service: Vascular;  Laterality: Right;   ENDOVEIN HARVEST OF GREATER SAPHENOUS VEIN  Left 01/18/2021   Procedure: ENDOVEIN HARVEST OF GREATER SAPHENOUS VEIN;  Surgeon: Loreli Slot, MD;  Location: [facility] OR;  Service: Open Heart Surgery;  Laterality: Left;   HERNIA REPAIR     IABP INSERTION Right 01/13/2021   Procedure: IABP Insertion;  Surgeon: Marcina Millard, MD;  Location: [facility] INVASIVE CV LAB;  Service: Cardiovascular;  Laterality: Right;   LEFT HEART CATH AND CORONARY ANGIOGRAPHY N/A 01/13/2021   Procedure: LEFT HEART CATH AND CORONARY ANGIOGRAPHY;  Surgeon: Marcina Millard, MD;  Location: [facility] INVASIVE CV LAB;  Service: Cardiovascular;  Laterality: N/A;   LOWER EXTREMITY ANGIOGRAPHY Left 01/03/2021   Procedure: LOWER EXTREMITY ANGIOGRAPHY;  Surgeon: Annice Needy, MD;  Location: [facility] INVASIVE CV LAB;  Service: Cardiovascular;  Laterality: Left;   LOWER EXTREMITY ANGIOGRAPHY Left 02/28/2021   Procedure: LOWER EXTREMITY ANGIOGRAPHY;  Surgeon: Annice Needy, MD;  Location: [facility] INVASIVE CV LAB;  Service: Cardiovascular;  Laterality: Left;   PATCH ANGIOPLASTY Left 05/06/2012   Procedure: WITH DACRON PATCH ANGIOPLASTY ;  Surgeon: Chuck Hint, MD;  Location: [facility] OR;  Service: Vascular;  Laterality: Left;   POLYPECTOMY  11/19/2018   Procedure: POLYPECTOMY;  Surgeon: Midge Minium, MD;  Location: [facility];  Service: Endoscopy;;   SPINE SURGERY  2004   TEE WITHOUT CARDIOVERSION N/A 01/18/2021   Procedure: TRANSESOPHAGEAL ECHOCARDIOGRAM (TEE);  Surgeon: Loreli Slot, MD;  Location: [facility] OR;  Service: Open Heart Surgery;  Laterality: N/A;   TONSILLECTOMY     TUBAL LIGATION      Medical History: Past Medical History:  Diagnosis Date   Anxiety    Arthritis    Bilateral carotid artery stenosis 2014   Carotid artery occlusion    Chronic kidney disease 06/18/2015   UTI   Chronic kidney disease 2017   Current smoker    CVA (cerebral vascular accident) (HCC) 2013   Depression    Diabetes (HCC)    Diverticulosis    Fatty liver     Fibromyalgia    GERD (gastroesophageal reflux disease)    H/O hiatal hernia    Heart attack (HCC) 01/12/2021   Hiatal hernia    Hypercholesteremia    Hypertension    IBS (irritable bowel syndrome)    PAD (peripheral artery disease) (HCC)    Peptic ulcer    Plantar fasciitis    Stroke (HCC) 01/31/2012   Right side-ministroke   T2DM (type 2 diabetes mellitus) (HCC)     Family History: Family History  Problem Relation Age of Onset   Hypertension Mother    Hyperlipidemia Mother    Deep vein thrombosis Mother    Cancer Father    Alcohol abuse Father    Heart failure Father    Hypertension Maternal Grandmother    Deep vein thrombosis Sister    Alcohol abuse Sister    Alcohol abuse Sister     Social History   Socioeconomic History   Marital status: Married    Spouse name: Not on file   Number of children: Not on file   Years of education: Not on file   Highest education level: Not on file  Occupational History   Not on file  Tobacco Use   Smoking status: Former    Current packs/day: 1.00    Average packs/day: 1 pack/day for 52.1 years (52.1 ttl pk-yrs)    Types: Cigarettes    Start date: 11/09/1970    Passive exposure: Current   Smokeless tobacco: Never   Tobacco comments:       Vaping Use   Vaping status: Former  Substance and Sexual Activity   Alcohol use: Not Currently    Comment: rarely   Drug use: No   Sexual activity: Not Currently    Partners: Male  Other Topics Concern   Not on file  Social History Narrative   ** Merged History Encounter **       Social Determinants of Health   Financial Resource Strain: Not on file  Food Insecurity: Not on file  Transportation Needs: Not on file  Physical Activity: Not on file  Stress: Not on file  Social Connections: Not on file  Intimate Partner Violence: Not on file      Review of Systems  Constitutional:  Positive for fatigue. Negative for chills and unexpected weight change.  HENT:  Negative for  congestion, rhinorrhea, sneezing and sore throat.   Eyes:  Negative for  redness.  Respiratory: Negative.  Negative for cough, chest tightness, shortness of breath and wheezing.   Cardiovascular: Negative.  Negative for chest pain and palpitations.  Gastrointestinal:  Negative for abdominal pain, constipation, diarrhea, nausea and vomiting.  Endocrine: Positive for polydipsia and polyuria.  Genitourinary:  Negative for difficulty urinating, dysuria, flank pain, frequency, hematuria and urgency.  Musculoskeletal:  Positive for arthralgias and back pain. Negative for joint swelling and neck pain.  Skin:  Negative for rash.  Neurological:  Positive for headaches. Negative for tremors and numbness.  Hematological:  Negative for adenopathy. Does not bruise/bleed easily.  Psychiatric/Behavioral:  Negative for behavioral problems (Depression), sleep disturbance and suicidal ideas. The patient is not nervous/anxious.     Vital Signs: Resp 16   Ht 5\' 7"  (1.702 m)   Wt 183 lb (83 kg)   BMI 28.66 kg/m    Observation/Objective: She is alert and oriented. No acute distress noted.     Assessment/Plan: 1. Type 2 diabetes mellitus with diabetic polyneuropathy, with long-term current use of insulin (HCC) Increase tresiba dose to 40 units at bed time, do not change bolus insulin doses. Follow up virtually to discuss in 1 week - insulin degludec (TRESIBA FLEXTOUCH) 200 UNIT/ML FlexTouch Pen; Inject 40 Units into the skin at bedtime. Increase by 4 units every 4 days if AM sugar >200.   General Counseling: jhade kirkendoll understanding of the findings of today's phone visit and agrees with plan of treatment. I have discussed any further diagnostic evaluation that may be needed or ordered today. We also reviewed her medications today. she has been encouraged to call the office with any questions or concerns that should arise related to todays visit.  Return in about 1 week (around 12/09/2022) for F/U,  Traylon Schimming PCP, sugars -- virtual visit. .   No orders of the defined types were placed in this encounter.   Meds ordered this encounter  Medications   insulin degludec (TRESIBA FLEXTOUCH) 200 UNIT/ML FlexTouch Pen    Sig: Inject 40 Units into the skin at bedtime. Increase by 4 units every 4 days if AM sugar >200.    Fill new script today    Time spent:20 Minutes Time spent with patient included reviewing progress notes, labs, imaging studies, and discussing plan for follow up.  [place] Controlled Substance Database was reviewed by me for overdose risk score (ORS) if appropriate.  This patient was seen by Sallyanne Kuster, FNP-C in collaboration with Dr. Beverely Risen as a part of collaborative care agreement.  Atalaya Zappia R. Tedd Sias, MSN, FNP-C Internal medicine

## 2022-12-06 ENCOUNTER — Encounter: Payer: Self-pay | Admitting: Nurse Practitioner

## 2022-12-06 DIAGNOSIS — F331 Major depressive disorder, recurrent, moderate: Secondary | ICD-10-CM | POA: Insufficient documentation

## 2022-12-16 ENCOUNTER — Other Ambulatory Visit: Payer: Self-pay | Admitting: Nurse Practitioner

## 2023-01-05 ENCOUNTER — Telehealth: Payer: Self-pay | Admitting: Internal Medicine

## 2023-01-05 NOTE — Telephone Encounter (Signed)
Received diabetic confirmation from [facility]. Gave to dfk for signature-Toni

## 2023-01-05 NOTE — Telephone Encounter (Signed)
diabetic confirmation faxed back to [facility]; [phone]. Scanned-Toni

## 2023-01-10 ENCOUNTER — Encounter: Payer: Self-pay | Admitting: Nurse Practitioner

## 2023-01-10 DIAGNOSIS — E1142 Type 2 diabetes mellitus with diabetic polyneuropathy: Secondary | ICD-10-CM | POA: Insufficient documentation

## 2023-01-14 ENCOUNTER — Other Ambulatory Visit: Payer: Self-pay | Admitting: Nurse Practitioner

## 2023-01-14 DIAGNOSIS — M79672 Pain in left foot: Secondary | ICD-10-CM

## 2023-01-14 DIAGNOSIS — M79671 Pain in right foot: Secondary | ICD-10-CM

## 2023-01-21 ENCOUNTER — Encounter: Payer: Self-pay | Admitting: Nurse Practitioner

## 2023-01-21 ENCOUNTER — Telehealth: Payer: Medicare HMO | Admitting: Nurse Practitioner

## 2023-01-21 VITALS — Resp 16 | Ht 67.0 in | Wt 193.0 lb

## 2023-01-21 DIAGNOSIS — Z794 Long term (current) use of insulin: Secondary | ICD-10-CM

## 2023-01-21 DIAGNOSIS — Z1231 Encounter for screening mammogram for malignant neoplasm of breast: Secondary | ICD-10-CM | POA: Diagnosis not present

## 2023-01-21 DIAGNOSIS — E1169 Type 2 diabetes mellitus with other specified complication: Secondary | ICD-10-CM | POA: Diagnosis not present

## 2023-01-21 DIAGNOSIS — Z951 Presence of aortocoronary bypass graft: Secondary | ICD-10-CM | POA: Diagnosis not present

## 2023-01-21 DIAGNOSIS — E785 Hyperlipidemia, unspecified: Secondary | ICD-10-CM

## 2023-01-21 DIAGNOSIS — E1159 Type 2 diabetes mellitus with other circulatory complications: Secondary | ICD-10-CM | POA: Diagnosis not present

## 2023-01-21 DIAGNOSIS — E538 Deficiency of other specified B group vitamins: Secondary | ICD-10-CM

## 2023-01-21 DIAGNOSIS — E559 Vitamin D deficiency, unspecified: Secondary | ICD-10-CM | POA: Diagnosis not present

## 2023-01-21 DIAGNOSIS — F331 Major depressive disorder, recurrent, moderate: Secondary | ICD-10-CM | POA: Diagnosis not present

## 2023-01-21 DIAGNOSIS — E1142 Type 2 diabetes mellitus with diabetic polyneuropathy: Secondary | ICD-10-CM

## 2023-01-21 DIAGNOSIS — Z87891 Personal history of nicotine dependence: Secondary | ICD-10-CM | POA: Diagnosis not present

## 2023-01-21 DIAGNOSIS — Z0000 Encounter for general adult medical examination without abnormal findings: Secondary | ICD-10-CM

## 2023-01-21 DIAGNOSIS — I152 Hypertension secondary to endocrine disorders: Secondary | ICD-10-CM

## 2023-01-21 MED ORDER — ACCU-CHEK GUIDE ME W/DEVICE KIT
1.0000 | PACK | Freq: Once | 0 refills | Status: AC
Start: 2023-01-21 — End: 2023-01-21

## 2023-01-21 NOTE — Progress Notes (Signed)
[facility] [address] [place], [place] [address]  Internal MEDICINE  Office Visit Note  Patient Name: Morgan Daniels  [id number]  [id number]  Date of Service: 01/21/2023  I connected with the patient at 1130 by telephone and verified the patients identity using two identifiers.   I discussed the limitations, risks, security and privacy concerns of performing an evaluation and management service by telephone and the availability of in person appointments. I also discussed with the patient that there may be a patient responsible charge related to the service.  The patient expressed understanding and agrees to proceed.     Chief Complaint  Patient presents with   Telephone Screen    Awv    Telephone Assessment    HPI Morgan Daniels presents for an annual well visit and physical exam.  Well-appearing 62 y.o. female with hypertension, carotid stenosis, PAD, CAD, allergic rhinitis, IBS, diverticulosis, GERD, diabetes, hypothyroidism, high cholesterol, depression, anxiety, chronic pain, arthritis, low vitamin D, and obesity. Also has a history of NSTEMI and stroke.  Routine CRC screening: due in 2030 Lung cancer screening: due now, quit smoking in the past 2 years. Smoked 1.5 ppd, for 50 years.  Routine mammogram: due now  Eye exam: sees eye doctor regularly foot exam: will be done at her next office visit  Labs: due for routine labs  New or worsening pain: chronic pain, no new pains  Other concerns: forgetting to administer her insulin and her glucose meter is broken Uses a cane when ambulating, does have trouble walking sometimes. Denies any issues with her hearing or vision.   Cardiologist: Dr. Marcina Millard Neurology: Elvin So with Dr. Malvin Johns at [facility] Neurosurgery: Dr. Wannetta Sender at [facility] Vascular Surgery: Dr. Festus Barren       01/21/2023   12:04 PM 01/15/2022   10:30 AM 12/25/2020   11:13 AM  MMSE - Mini Mental State Exam  Orientation to time 5 5 5    Orientation to Place 5 5 5   Registration 3 3 3   Attention/ Calculation 5 5 5   Recall 3 3 3   Language- name 2 objects 2 2 2   Language- repeat 1 1 1   Language- follow 3 step command 3 3 3   Language- read & follow direction 1 1 1   Write a sentence 1 0 1  Copy design 1 1 1   Total score 30 29 30     Functional Status Survey: Is the patient deaf or have difficulty hearing?: No Does the patient have difficulty seeing, even when wearing glasses/contacts?: No Does the patient have difficulty concentrating, remembering, or making decisions?: Yes Does the patient have difficulty walking or climbing stairs?: Yes Does the patient have difficulty dressing or bathing?: No Does the patient have difficulty doing errands alone such as visiting a doctor's office or shopping?: Yes     02/03/2022    8:16 PM 02/04/2022    7:45 AM 04/22/2022    3:45 PM 07/24/2022    2:31 PM 01/21/2023   12:02 PM  Fall Risk  Falls in the past year?   0 1 1  Was there an injury with Fall?   0  0  Fall Risk Category Calculator   0  2  (RETIRED) Patient Fall Risk Level High fall risk Moderate fall risk     Patient at Risk for Falls Due to   No Fall Risks History of fall(s) History of fall(s);Impaired balance/gait;Impaired mobility;Medication side effect  Fall risk Follow up   Falls evaluation completed Falls  evaluation completed Falls evaluation completed;Education provided       01/21/2023   11:54 AM  Depression screen PHQ 2/9  Decreased Interest 0  Down, Depressed, Hopeless 3  PHQ - 2 Score 3  Altered sleeping 3  Tired, decreased energy 3  Change in appetite 2  Feeling bad or failure about yourself  2  Trouble concentrating 3  Moving slowly or fidgety/restless 1  Suicidal thoughts 3  PHQ-9 Score 20  Difficult doing work/chores Very difficult     Medicare Risk at Home - 01/21/23 1203     Any stairs in or around the home? Yes    If so, are there any without handrails? No    Home free of loose throw rugs in  walkways, pet beds, electrical cords, etc? Yes    Adequate lighting in your home to reduce risk of falls? Yes    Life alert? Yes    Use of a cane, walker or w/c? Yes    Grab bars in the bathroom? Yes    Shower chair or bench in shower? Yes    Elevated toilet seat or a handicapped toilet? No                Current Medication: Outpatient Encounter Medications as of 01/21/2023  Medication Sig Note   Blood Glucose Monitoring Suppl (ACCU-CHEK GUIDE ME) w/Device KIT 1 Device by Does not apply route once for 1 dose.    Accu-Chek Softclix Lancets lancets Use 1 lancet before meals and at bed and prn to check glucose with glucose meter.    Alirocumab (PRALUENT) 150 MG/ML SOAJ Inject 1 mL (150 mg total) into the skin every 30 (thirty) days.    amLODipine (NORVASC) 10 MG tablet Take 1 tablet (10 mg total) by mouth daily.    aspirin 81 MG chewable tablet Chew 1 tablet (81 mg total) by mouth daily.    Cholecalciferol (VITAMIN D-3) 125 MCG (5000 UT) TABS Take 5,000 Units by mouth daily.    clopidogrel (PLAVIX) 75 MG tablet Take 75 mg by mouth daily. 02/01/2022: Patient states "don't think I'm still taking that"   dicyclomine (BENTYL) 10 MG capsule TAKE 1 CAPSULE BY MOUTH THREE TIMES DAILY BEFORE MEALS    empagliflozin (JARDIANCE) 25 MG TABS tablet Take 1 tablet (25 mg total) by mouth daily before breakfast.    escitalopram (LEXAPRO) 10 MG tablet Take 10 mg by mouth daily.    gabapentin (NEURONTIN) 600 MG tablet Take 1 tablet (600 mg total) by mouth 3 (three) times daily.    gemfibrozil (LOPID) 600 MG tablet Take 600 mg by mouth 2 (two) times daily.    glipiZIDE (GLUCOTROL XL) 5 MG 24 hr tablet Take 1 tablet (5 mg total) by mouth daily with breakfast.    glucose blood (ACCU-CHEK GUIDE) test strip Use 1 test strip to check glucose before meals, at bedtime and prn with glucose meter    insulin aspart (FIASP) 100 UNIT/ML FlexTouch Pen Inject insulin into skin 3 times daily with meals per sliding scale  provided to patient. Max dose: 63 units per 24 hours    insulin degludec (TRESIBA FLEXTOUCH) 200 UNIT/ML FlexTouch Pen Inject 40 Units into the skin at bedtime. Increase by 4 units every 4 days if AM sugar >200.    losartan (COZAAR) 25 MG tablet Take 1 tablet (25 mg total) by mouth daily.    metoprolol succinate (TOPROL-XL) 50 MG 24 hr tablet TAKE 1 TABLET BY MOUTH EVERY DAY  mirtazapine (REMERON) 7.5 MG tablet Take 1 tablet (7.5 mg total) by mouth at bedtime.    nitroGLYCERIN (NITRODUR - DOSED IN MG/24 HR) 0.2 mg/hr patch Place 1 patch (0.2 mg total) onto the skin daily.    pentoxifylline (TRENTAL) 400 MG CR tablet Take 400 mg by mouth 2 (two) times daily.    rosuvastatin (CRESTOR) 40 MG tablet TAKE 1 TABLET BY MOUTH EVERYDAY    tiZANidine (ZANAFLEX) 4 MG tablet TAKE 1 TABLET BY MOUTH TWICE DAILY AS NEEDED FOR MUSCLE PAIN AND SPASMS    traMADol (ULTRAM) 50 MG tablet TAKE ONE TABLET BY MOUTH THREE TIMES DAILY AS NEEDED FOR MODERATE OR SEVERE PAIN    ULTICARE MICRO PEN NEEDLES 32G X 4 MM MISC TO USE WITH INSULIN DOSING THREE TIMES DAILY PRIOR TO MEALS AND AT BEDTIME FOR BASAL INSULIN.    VASCEPA 1 g capsule TAKE 1 CAPSULE BY MOUTH 2 TIMES DAILY    No facility-administered encounter medications on file as of 01/21/2023.    Surgical History: Past Surgical History:  Procedure Laterality Date   ABDOMINAL HYSTERECTOMY  1990   BACK SURGERY  2000, 2004   CAROTID ENDARTERECTOMY Left 05/06/12   CARPAL TUNNEL RELEASE Left 07/18/2015   Procedure: CARPAL TUNNEL RELEASE;  Surgeon: Deeann Saint, MD;  Location: [facility] ORS;  Service: Orthopedics;  Laterality: Left;   CEREBRAL ANGIOGRAM Bilateral 05/03/2012   Procedure: CEREBRAL ANGIOGRAM;  Surgeon: Chuck Hint, MD;  Location: [facility] CATH LAB;  Service: Cardiovascular;  Laterality: Bilateral;   CHOLECYSTECTOMY  2001   COLONOSCOPY WITH PROPOFOL N/A 11/19/2018   Procedure: COLONOSCOPY WITH PROPOFOL;  Surgeon: Midge Minium, MD;  Location: [facility] SURGERY  CNTR;  Service: Endoscopy;  Laterality: N/A;  Diabetic - insulin   CORONARY ARTERY BYPASS GRAFT N/A 01/18/2021   Procedure: CORONARY ARTERY BYPASS GRAFTING (CABG) x 4  USING LEFT INTERNAL MAMMARY ARTERY AND LEFT ENDOSCOPIC GREATER SAPHENOUS VEIN CONDUITS;  Surgeon: Loreli Slot, MD;  Location: [facility] OR;  Service: Open Heart Surgery;  Laterality: N/A;   CORONARY/GRAFT ACUTE MI REVASCULARIZATION N/A 01/13/2021   Procedure: Coronary/Graft Acute MI Revascularization;  Surgeon: Marcina Millard, MD;  Location: [facility] INVASIVE CV LAB;  Service: Cardiovascular;  Laterality: N/A;   ENDARTERECTOMY Left 05/06/2012   Procedure: ENDARTERECTOMY CAROTID;  Surgeon: Chuck Hint, MD;  Location: [facility] OR;  Service: Vascular;  Laterality: Left;   ENDARTERECTOMY Right 08/09/2013   Procedure: ENDARTERECTOMY CAROTID-RIGHT;  Surgeon: Chuck Hint, MD;  Location: [facility] OR;  Service: Vascular;  Laterality: Right;   ENDOVEIN HARVEST OF GREATER SAPHENOUS VEIN Left 01/18/2021   Procedure: ENDOVEIN HARVEST OF GREATER SAPHENOUS VEIN;  Surgeon: Loreli Slot, MD;  Location: [facility] OR;  Service: Open Heart Surgery;  Laterality: Left;   HERNIA REPAIR     IABP INSERTION Right 01/13/2021   Procedure: IABP Insertion;  Surgeon: Marcina Millard, MD;  Location: [facility] INVASIVE CV LAB;  Service: Cardiovascular;  Laterality: Right;   LEFT HEART CATH AND CORONARY ANGIOGRAPHY N/A 01/13/2021   Procedure: LEFT HEART CATH AND CORONARY ANGIOGRAPHY;  Surgeon: Marcina Millard, MD;  Location: [facility] INVASIVE CV LAB;  Service: Cardiovascular;  Laterality: N/A;   LOWER EXTREMITY ANGIOGRAPHY Left 01/03/2021   Procedure: LOWER EXTREMITY ANGIOGRAPHY;  Surgeon: Annice Needy, MD;  Location: [facility] INVASIVE CV LAB;  Service: Cardiovascular;  Laterality: Left;   LOWER EXTREMITY ANGIOGRAPHY Left 02/28/2021   Procedure: LOWER EXTREMITY ANGIOGRAPHY;  Surgeon: Annice Needy, MD;  Location: [facility] INVASIVE CV LAB;  Service: Cardiovascular;   Laterality: Left;  PATCH ANGIOPLASTY Left 05/06/2012   Procedure: WITH DACRON PATCH ANGIOPLASTY ;  Surgeon: Chuck Hint, MD;  Location: [facility] OR;  Service: Vascular;  Laterality: Left;   POLYPECTOMY  11/19/2018   Procedure: POLYPECTOMY;  Surgeon: Midge Minium, MD;  Location: [facility] SURGERY CNTR;  Service: Endoscopy;;   SPINE SURGERY  2004   TEE WITHOUT CARDIOVERSION N/A 01/18/2021   Procedure: TRANSESOPHAGEAL ECHOCARDIOGRAM (TEE);  Surgeon: Loreli Slot, MD;  Location: [facility] OR;  Service: Open Heart Surgery;  Laterality: N/A;   TONSILLECTOMY     TUBAL LIGATION      Medical History: Past Medical History:  Diagnosis Date   Anxiety    Arthritis    Bilateral carotid artery stenosis 2014   Carotid artery occlusion    Chronic kidney disease 06/18/2015   UTI   Chronic kidney disease 2017   Current smoker    CVA (cerebral vascular accident) (HCC) 2013   Depression    Diabetes (HCC)    Diverticulosis    Fatty liver    Fibromyalgia    GERD (gastroesophageal reflux disease)    H/O hiatal hernia    Heart attack (HCC) 01/12/2021   Hiatal hernia    Hypercholesteremia    Hypertension    IBS (irritable bowel syndrome)    PAD (peripheral artery disease) (HCC)    Peptic ulcer    Plantar fasciitis    Stroke (HCC) 01/31/2012   Right side-ministroke   T2DM (type 2 diabetes mellitus) (HCC)     Family History: Family History  Problem Relation Age of Onset   Hypertension Mother    Hyperlipidemia Mother    Deep vein thrombosis Mother    Cancer Father    Alcohol abuse Father    Heart failure Father    Hypertension Maternal Grandmother    Deep vein thrombosis Sister    Alcohol abuse Sister    Alcohol abuse Sister     Social History   Socioeconomic History   Marital status: Married    Spouse name: Not on file   Number of children: Not on file   Years of education: Not on file   Highest education level: Not on file  Occupational History   Not on file  Tobacco Use    Smoking status: Former    Current packs/day: 1.00    Average packs/day: 1 pack/day for 52.2 years (52.2 ttl pk-yrs)    Types: Cigarettes    Start date: 11/09/1970    Passive exposure: Current   Smokeless tobacco: Never   Tobacco comments:       Vaping Use   Vaping status: Former  Substance and Sexual Activity   Alcohol use: Not Currently    Comment: rarely   Drug use: No   Sexual activity: Not Currently    Partners: Male  Other Topics Concern   Not on file  Social History Narrative   ** Merged History Encounter **       Social Determinants of Health   Financial Resource Strain: Not on file  Food Insecurity: Not on file  Transportation Needs: Not on file  Physical Activity: Not on file  Stress: Not on file  Social Connections: Not on file  Intimate Partner Violence: Not on file      Review of Systems  Constitutional:  Positive for activity change, appetite change, fatigue and unexpected weight change. Negative for chills and fever.  HENT: Negative.  Negative for congestion, ear pain, rhinorrhea, sore throat and trouble swallowing.   Eyes: Negative.  Respiratory: Negative.  Negative for cough, chest tightness, shortness of breath and wheezing.   Cardiovascular: Negative.  Negative for chest pain and palpitations.  Gastrointestinal:  Positive for abdominal distention (bloating, heartburn), abdominal pain, constipation and diarrhea. Negative for blood in stool, nausea and vomiting.  Endocrine: Negative.   Genitourinary: Negative.  Negative for difficulty urinating, dysuria, frequency, hematuria and urgency.  Musculoskeletal:  Positive for arthralgias and back pain. Negative for joint swelling, myalgias and neck pain.  Skin: Negative.  Negative for rash and wound.  Allergic/Immunologic: Negative.  Negative for immunocompromised state.  Neurological:  Positive for weakness. Negative for dizziness, seizures, numbness and headaches.  Hematological: Negative.    Psychiatric/Behavioral:  Positive for behavioral problems, decreased concentration and sleep disturbance. Negative for self-injury and suicidal ideas. The patient is nervous/anxious.     Vital Signs: Resp 16   Ht 5\' 7"  (1.702 m)   Wt 193 lb (87.5 kg)   BMI 30.23 kg/m    Objective Data: She is alert and oriented. No acute distress noted      Assessment/Plan: 1. Encounter for subsequent annual wellness visit (AWV) in Medicare patient Age-appropriate preventive screenings and vaccinations discussed. Routine labs for health maintenance ordered, see below. PHM updated.   2. Type 2 diabetes mellitus with diabetic polyneuropathy, with long-term current use of insulin (HCC) Routine labs ordered. Continue medications as prescribed. Discussed interventions to try to help the patient remember to administer her insulin every day. New glucose meter prescribed, prior meter is broken per patient. Follow up with patient in 1 month.  - CBC with Differential/Platelet - Lipid Profile - Vitamin D (25 hydroxy) - Comprehensive Metabolic Panel (CMET) - Lipase, blood; Future - Urine Microalbumin w/creat. Ratio - New accu-chek device kit prescribed.   3. Hypertension associated with type 2 diabetes mellitus (HCC) Routine labs ordered, continue amlodipine, losartan, and metoprolol as prescribed.   4. Hyperlipidemia associated with type 2 diabetes mellitus (HCC) Routine labs ordered . Continue rosuvastatin, vascepa and gemfibrozil as prescribed   5. B12 deficiency Routine labs ordered  - CBC with Differential/Platelet - Lipid Profile - Vitamin D (25 hydroxy) - Comprehensive Metabolic Panel (CMET) - Lipase, blood; Future  6. Vitamin D deficiency Routine labs ordered  - CBC with Differential/Platelet - Lipid Profile - Vitamin D (25 hydroxy) - Comprehensive Metabolic Panel (CMET) - Lipase, blood; Future  7. S/P CABG x 4 Sees cardiology   8. Stopped smoking with greater than 40 pack year  history CT chest ordered  - CT CHEST LUNG CA SCREEN LOW DOSE W/O CM; Future  9. Encounter for screening mammogram for malignant neoplasm of breast Routine mammogram ordered  - MM 3D SCREENING MAMMOGRAM BILATERAL BREAST; Future  10. Moderate episode of recurrent major depressive disorder (HCC) Referred to psychiatry for further evaluation and medication management and therapy.  - Ambulatory referral to Psychiatry      General Counseling: minde gash understanding of the findings of todays visit and agrees with plan of treatment. I have discussed any further diagnostic evaluation that may be needed or ordered today. We also reviewed her medications today. she has been encouraged to call the office with any questions or concerns that should arise related to todays visit.    Orders Placed This Encounter  Procedures   CT CHEST LUNG CA SCREEN LOW DOSE W/O CM   MM 3D SCREENING MAMMOGRAM BILATERAL BREAST   CBC with Differential/Platelet   Lipid Profile   Vitamin D (25 hydroxy)   Comprehensive Metabolic Panel (CMET)  Lipase, blood    Meds ordered this encounter  Medications   Blood Glucose Monitoring Suppl (ACCU-CHEK GUIDE ME) w/Device KIT    Sig: 1 Device by Does not apply route once for 1 dose.    Dispense:  1 kit    Refill:  0    Dx code E11.65    Return in about 1 month (around 02/21/2023) for F/U, Labs, Tanaya Dunigan PCP and ACR and foot exam. .   Total time spent:30 Minutes Time spent includes review of chart, medications, test results, and follow up plan with the patient.   [place] Controlled Substance Database was reviewed by me.  This patient was seen by Sallyanne Kuster, FNP-C in collaboration with Dr. Beverely Risen as a part of collaborative care agreement.  Mikel Pyon R. Tedd Sias, MSN, FNP-C Internal medicine

## 2023-02-06 ENCOUNTER — Other Ambulatory Visit: Payer: Self-pay | Admitting: Nurse Practitioner

## 2023-02-06 DIAGNOSIS — E1140 Type 2 diabetes mellitus with diabetic neuropathy, unspecified: Secondary | ICD-10-CM

## 2023-02-09 ENCOUNTER — Other Ambulatory Visit
Admission: RE | Admit: 2023-02-09 | Discharge: 2023-02-09 | Disposition: A | Discharge status: Home / Self Care | Payer: Medicare HMO | Source: Ambulatory Visit | Attending: Nurse Practitioner | Admitting: Nurse Practitioner

## 2023-02-09 ENCOUNTER — Other Ambulatory Visit: Payer: Self-pay

## 2023-02-09 DIAGNOSIS — Z794 Long term (current) use of insulin: Secondary | ICD-10-CM | POA: Diagnosis not present

## 2023-02-09 DIAGNOSIS — E1142 Type 2 diabetes mellitus with diabetic polyneuropathy: Secondary | ICD-10-CM | POA: Diagnosis not present

## 2023-02-09 DIAGNOSIS — E538 Deficiency of other specified B group vitamins: Secondary | ICD-10-CM | POA: Diagnosis not present

## 2023-02-09 DIAGNOSIS — E559 Vitamin D deficiency, unspecified: Secondary | ICD-10-CM | POA: Diagnosis not present

## 2023-02-09 LAB — CBC WITH DIFFERENTIAL/PLATELET
Abs Immature Granulocytes: 0.02 10*3/uL (ref 0.00–0.07)
Basophils Absolute: 0.1 10*3/uL (ref 0.0–0.1)
Basophils Relative: 1 %
Eosinophils Absolute: 0.4 10*3/uL (ref 0.0–0.5)
Eosinophils Relative: 5 %
HCT: 37.3 % (ref 36.0–46.0)
Hemoglobin: 12.5 g/dL (ref 12.0–15.0)
Immature Granulocytes: 0 %
Lymphocytes Relative: 27 %
Lymphs Abs: 1.9 10*3/uL (ref 0.7–4.0)
MCH: 27.5 pg (ref 26.0–34.0)
MCHC: 33.5 g/dL (ref 30.0–36.0)
MCV: 82 fL (ref 80.0–100.0)
Monocytes Absolute: 0.6 10*3/uL (ref 0.1–1.0)
Monocytes Relative: 9 %
Neutro Abs: 4.2 10*3/uL (ref 1.7–7.7)
Neutrophils Relative %: 58 %
Platelets: 357 10*3/uL (ref 150–400)
RBC: 4.55 MIL/uL (ref 3.87–5.11)
RDW: 13 % (ref 11.5–15.5)
WBC: 7.1 10*3/uL (ref 4.0–10.5)
nRBC: 0 % (ref 0.0–0.2)

## 2023-02-09 LAB — LIPASE, BLOOD: Lipase: 27 U/L (ref 11–51)

## 2023-02-20 ENCOUNTER — Other Ambulatory Visit: Payer: Self-pay | Admitting: Nurse Practitioner

## 2023-02-20 DIAGNOSIS — Z794 Long term (current) use of insulin: Secondary | ICD-10-CM

## 2023-02-24 ENCOUNTER — Ambulatory Visit: Payer: Medicare HMO | Admitting: Nurse Practitioner

## 2023-03-18 ENCOUNTER — Other Ambulatory Visit: Payer: Self-pay

## 2023-03-18 DIAGNOSIS — E785 Hyperlipidemia, unspecified: Secondary | ICD-10-CM

## 2023-03-18 DIAGNOSIS — E1142 Type 2 diabetes mellitus with diabetic polyneuropathy: Secondary | ICD-10-CM

## 2023-03-18 DIAGNOSIS — Z794 Long term (current) use of insulin: Secondary | ICD-10-CM

## 2023-03-18 DIAGNOSIS — E1159 Type 2 diabetes mellitus with other circulatory complications: Secondary | ICD-10-CM

## 2023-03-18 MED ORDER — LOSARTAN POTASSIUM 25 MG PO TABS: 25.0000 mg | ORAL_TABLET | Freq: Every day | ORAL | 3 refills | Status: DC

## 2023-03-18 MED ORDER — GLIPIZIDE ER 5 MG PO TB24: 5.0000 mg | ORAL_TABLET | Freq: Every day | ORAL | 1 refills | Status: DC

## 2023-03-18 MED ORDER — EMPAGLIFLOZIN 25 MG PO TABS: 25.0000 mg | ORAL_TABLET | Freq: Every day | ORAL | 3 refills | Status: DC

## 2023-03-18 MED ORDER — GEMFIBROZIL 600 MG PO TABS: 600.0000 mg | ORAL_TABLET | Freq: Two times a day (BID) | ORAL | 1 refills | Status: DC

## 2023-03-18 NOTE — Telephone Encounter (Signed)
Lmom for pt to verify phar that she change to [facility]

## 2023-03-20 ENCOUNTER — Other Ambulatory Visit: Payer: Self-pay | Admitting: Nurse Practitioner

## 2023-03-20 ENCOUNTER — Other Ambulatory Visit: Payer: Self-pay

## 2023-03-20 DIAGNOSIS — Z760 Encounter for issue of repeat prescription: Secondary | ICD-10-CM

## 2023-03-20 DIAGNOSIS — M79672 Pain in left foot: Secondary | ICD-10-CM

## 2023-03-20 DIAGNOSIS — M79671 Pain in right foot: Secondary | ICD-10-CM

## 2023-03-20 DIAGNOSIS — Z794 Long term (current) use of insulin: Secondary | ICD-10-CM

## 2023-03-24 ENCOUNTER — Telehealth: Payer: Self-pay | Admitting: Nurse Practitioner

## 2023-03-24 NOTE — Telephone Encounter (Signed)
Lvm to schedule 02/24/23 missed appointment-Toni

## 2023-04-03 ENCOUNTER — Ambulatory Visit: Payer: Medicare HMO | Admitting: Nurse Practitioner

## 2023-04-14 ENCOUNTER — Ambulatory Visit: Payer: Medicare HMO | Admitting: Nurse Practitioner

## 2023-04-14 ENCOUNTER — Encounter: Payer: Self-pay | Admitting: Nurse Practitioner

## 2023-04-14 VITALS — BP 138/80 | HR 94 | Temp 98.4°F | Resp 16 | Ht 67.0 in | Wt 182.8 lb

## 2023-04-14 DIAGNOSIS — E1159 Type 2 diabetes mellitus with other circulatory complications: Secondary | ICD-10-CM | POA: Diagnosis not present

## 2023-04-14 DIAGNOSIS — I152 Hypertension secondary to endocrine disorders: Secondary | ICD-10-CM

## 2023-04-14 DIAGNOSIS — E1169 Type 2 diabetes mellitus with other specified complication: Secondary | ICD-10-CM

## 2023-04-14 DIAGNOSIS — M79672 Pain in left foot: Secondary | ICD-10-CM

## 2023-04-14 DIAGNOSIS — M79671 Pain in right foot: Secondary | ICD-10-CM

## 2023-04-14 DIAGNOSIS — Z794 Long term (current) use of insulin: Secondary | ICD-10-CM

## 2023-04-14 DIAGNOSIS — E1142 Type 2 diabetes mellitus with diabetic polyneuropathy: Secondary | ICD-10-CM | POA: Diagnosis not present

## 2023-04-14 DIAGNOSIS — E785 Hyperlipidemia, unspecified: Secondary | ICD-10-CM

## 2023-04-14 LAB — POCT GLYCOSYLATED HEMOGLOBIN (HGB A1C): Hemoglobin A1C: 10.8 % — AB (ref 4.0–5.6)

## 2023-04-14 MED ORDER — TRAMADOL HCL 50 MG PO TABS: ORAL_TABLET | ORAL | 2 refills | Status: DC

## 2023-04-14 MED ORDER — ADVOCATE SAFETY LANCETS 26G MISC: 12 refills | Status: AC

## 2023-04-14 NOTE — Progress Notes (Signed)
 [facility] [address] [place], [place] [address]  Internal MEDICINE  Office Visit Note  Patient Name: Morgan Daniels  [id number]  [id number]  Date of Service: 04/14/2023  Chief Complaint  Patient presents with   Depression   Diabetes   Gastroesophageal Reflux   Hyperlipidemia   Hypertension   Follow-up    HPI Morgan Daniels presents for a follow-up visit for diabetes, hypertension, high cholesterol and chronic pain. Diabetes -- A1c improving, but still elevated down to 10.8. has a working glucose meter but the lancing device is broken and they are difficult to use. Needs safety lancets that are single use.  Hypertension -- controlled with amlodipine, losartan and metoprolol.  High cholesterol -- taking gemfibrozil, rosuvastatin and fish oil.  Chronic pain -- takes tramadol as needed.     Current Medication: Outpatient Encounter Medications as of 04/14/2023  Medication Sig Note   Advocate Safety Lancets 26G MISC Use 1 safety lancet to check glucose 4 times daily  for diabetes E11.65    Alirocumab (PRALUENT) 150 MG/ML SOAJ Inject 1 mL (150 mg total) into the skin every 30 (thirty) days.    amLODipine (NORVASC) 10 MG tablet Take 1 tablet (10 mg total) by mouth daily.    aspirin 81 MG chewable tablet Chew 1 tablet (81 mg total) by mouth daily.    Cholecalciferol (VITAMIN D-3) 125 MCG (5000 UT) TABS Take 5,000 Units by mouth daily.    clopidogrel (PLAVIX) 75 MG tablet Take 75 mg by mouth daily. 02/01/2022: Patient states "don't think I'm still taking that"   dicyclomine (BENTYL) 10 MG capsule TAKE 1 CAPSULE BY MOUTH THREE TIMES DAILY BEFORE MEALS    empagliflozin (JARDIANCE) 25 MG TABS tablet Take 1 tablet (25 mg total) by mouth daily before breakfast.    escitalopram (LEXAPRO) 10 MG tablet Take 10 mg by mouth daily.    gabapentin (NEURONTIN) 600 MG tablet TAKE ONE TABLET BY MOUTH 3 TIMES DAILY    gemfibrozil (LOPID) 600 MG tablet Take 1 tablet (600 mg total) by mouth 2  (two) times daily.    glipiZIDE (GLUCOTROL XL) 5 MG 24 hr tablet Take 1 tablet (5 mg total) by mouth daily with breakfast.    glucose blood (ACCU-CHEK GUIDE) test strip Use 1 test strip to check glucose before meals, at bedtime and prn with glucose meter    insulin aspart (FIASP) 100 UNIT/ML FlexTouch Pen Inject insulin into skin 3 times daily with meals per sliding scale provided to patient. Max dose: 63 units per 24 hours    insulin degludec (TRESIBA FLEXTOUCH) 200 UNIT/ML FlexTouch Pen Inject 40 Units into the skin at bedtime. Increase by 4 units every 4 days if AM sugar >200.    losartan (COZAAR) 25 MG tablet Take 1 tablet (25 mg total) by mouth daily.    metoprolol succinate (TOPROL-XL) 50 MG 24 hr tablet TAKE 1 TABLET BY MOUTH EVERY DAY    mirtazapine (REMERON) 15 MG tablet TAKE ONE TABLET BY MOUTH AT BEDTIME    pentoxifylline (TRENTAL) 400 MG CR tablet Take 400 mg by mouth 2 (two) times daily.    rosuvastatin (CRESTOR) 40 MG tablet TAKE 1 TABLET BY MOUTH EVERYDAY    tiZANidine (ZANAFLEX) 4 MG tablet TAKE 1 TABLET BY MOUTH TWICE DAILY AS NEEDED FOR MUSCLE PAIN AND SPASMS    ULTICARE MICRO PEN NEEDLES 32G X 4 MM MISC TO USE WITH INSULIN DOSING THREE TIMES DAILY PRIOR TO MEALS AND AT BEDTIME FOR BASAL INSULIN.  VASCEPA 1 g capsule TAKE 1 CAPSULE BY MOUTH 2 TIMES DAILY    [DISCONTINUED] Accu-Chek Softclix Lancets lancets Use 1 lancet before meals and at bed and prn to check glucose with glucose meter.    [DISCONTINUED] traMADol (ULTRAM) 50 MG tablet TAKE ONE TABLET BY MOUTH THREE TIMES DAILY AS NEEDED FOR MODERATE OR SEVERE PAIN    nitroGLYCERIN (NITRODUR - DOSED IN MG/24 HR) 0.2 mg/hr patch Place 1 patch (0.2 mg total) onto the skin daily.    traMADol (ULTRAM) 50 MG tablet TAKE ONE TABLET BY MOUTH THREE TIMES DAILY AS NEEDED FOR MODERATE OR SEVERE PAIN    No facility-administered encounter medications on file as of 04/14/2023.    Surgical History: Past Surgical History:  Procedure  Laterality Date   ABDOMINAL HYSTERECTOMY  1990   BACK SURGERY  2000, 2004   CAROTID ENDARTERECTOMY Left 05/06/12   CARPAL TUNNEL RELEASE Left 07/18/2015   Procedure: CARPAL TUNNEL RELEASE;  Surgeon: Deeann Saint, MD;  Location: [facility] ORS;  Service: Orthopedics;  Laterality: Left;   CEREBRAL ANGIOGRAM Bilateral 05/03/2012   Procedure: CEREBRAL ANGIOGRAM;  Surgeon: Chuck Hint, MD;  Location: [facility] CATH LAB;  Service: Cardiovascular;  Laterality: Bilateral;   CHOLECYSTECTOMY  2001   COLONOSCOPY WITH PROPOFOL N/A 11/19/2018   Procedure: COLONOSCOPY WITH PROPOFOL;  Surgeon: Midge Minium, MD;  Location: [facility] SURGERY [facility];  Service: Endoscopy;  Laterality: N/A;  Diabetic - insulin   CORONARY ARTERY BYPASS GRAFT N/A 01/18/2021   Procedure: CORONARY ARTERY BYPASS GRAFTING (CABG) x 4  USING LEFT INTERNAL MAMMARY ARTERY AND LEFT ENDOSCOPIC GREATER SAPHENOUS VEIN CONDUITS;  Surgeon: Loreli Slot, MD;  Location: [facility] OR;  Service: Open Heart Surgery;  Laterality: N/A;   CORONARY/GRAFT ACUTE MI REVASCULARIZATION N/A 01/13/2021   Procedure: Coronary/Graft Acute MI Revascularization;  Surgeon: Marcina Millard, MD;  Location: [facility] INVASIVE CV LAB;  Service: Cardiovascular;  Laterality: N/A;   ENDARTERECTOMY Left 05/06/2012   Procedure: ENDARTERECTOMY CAROTID;  Surgeon: Chuck Hint, MD;  Location: [facility] OR;  Service: Vascular;  Laterality: Left;   ENDARTERECTOMY Right 08/09/2013   Procedure: ENDARTERECTOMY CAROTID-RIGHT;  Surgeon: Chuck Hint, MD;  Location: [facility] OR;  Service: Vascular;  Laterality: Right;   ENDOVEIN HARVEST OF GREATER SAPHENOUS VEIN Left 01/18/2021   Procedure: ENDOVEIN HARVEST OF GREATER SAPHENOUS VEIN;  Surgeon: Loreli Slot, MD;  Location: [facility] OR;  Service: Open Heart Surgery;  Laterality: Left;   HERNIA REPAIR     IABP INSERTION Right 01/13/2021   Procedure: IABP Insertion;  Surgeon: Marcina Millard, MD;  Location: [facility] INVASIVE CV LAB;  Service:  Cardiovascular;  Laterality: Right;   LEFT HEART CATH AND CORONARY ANGIOGRAPHY N/A 01/13/2021   Procedure: LEFT HEART CATH AND CORONARY ANGIOGRAPHY;  Surgeon: Marcina Millard, MD;  Location: [facility] INVASIVE CV LAB;  Service: Cardiovascular;  Laterality: N/A;   LOWER EXTREMITY ANGIOGRAPHY Left 01/03/2021   Procedure: LOWER EXTREMITY ANGIOGRAPHY;  Surgeon: Annice Needy, MD;  Location: [facility] INVASIVE CV LAB;  Service: Cardiovascular;  Laterality: Left;   LOWER EXTREMITY ANGIOGRAPHY Left 02/28/2021   Procedure: LOWER EXTREMITY ANGIOGRAPHY;  Surgeon: Annice Needy, MD;  Location: [facility] INVASIVE CV LAB;  Service: Cardiovascular;  Laterality: Left;   PATCH ANGIOPLASTY Left 05/06/2012   Procedure: WITH DACRON PATCH ANGIOPLASTY ;  Surgeon: Chuck Hint, MD;  Location: [facility] OR;  Service: Vascular;  Laterality: Left;   POLYPECTOMY  11/19/2018   Procedure: POLYPECTOMY;  Surgeon: Midge Minium, MD;  Location: [facility] SURGERY CNTR;  Service: Endoscopy;;  SPINE SURGERY  2004   TEE WITHOUT CARDIOVERSION N/A 01/18/2021   Procedure: TRANSESOPHAGEAL ECHOCARDIOGRAM (TEE);  Surgeon: Loreli Slot, MD;  Location: [facility] OR;  Service: Open Heart Surgery;  Laterality: N/A;   TONSILLECTOMY     TUBAL LIGATION      Medical History: Past Medical History:  Diagnosis Date   Anxiety    Arthritis    Bilateral carotid artery stenosis 2014   Carotid artery occlusion    Chronic kidney disease 06/18/2015   UTI   Chronic kidney disease 2017   Current smoker    CVA (cerebral vascular accident) (HCC) 2013   Depression    Diabetes (HCC)    Diverticulosis    Fatty liver    Fibromyalgia    GERD (gastroesophageal reflux disease)    H/O hiatal hernia    Heart attack (HCC) 01/12/2021   Hiatal hernia    Hypercholesteremia    Hypertension    IBS (irritable bowel syndrome)    PAD (peripheral artery disease) (HCC)    Peptic ulcer    Plantar fasciitis    Stroke (HCC) 01/31/2012   Right side-ministroke   T2DM  (type 2 diabetes mellitus) (HCC)     Family History: Family History  Problem Relation Age of Onset   Hypertension Mother    Hyperlipidemia Mother    Deep vein thrombosis Mother    Cancer Father    Alcohol abuse Father    Heart failure Father    Hypertension Maternal Grandmother    Deep vein thrombosis Sister    Alcohol abuse Sister    Alcohol abuse Sister     Social History   Socioeconomic History   Marital status: Married    Spouse name: Not on file   Number of children: Not on file   Years of education: Not on file   Highest education level: Not on file  Occupational History   Not on file  Tobacco Use   Smoking status: Former    Current packs/day: 1.00    Average packs/day: 1 pack/day for 52.5 years (52.5 ttl pk-yrs)    Types: Cigarettes    Start date: 11/09/1970    Passive exposure: Current   Smokeless tobacco: Never   Tobacco comments:       Vaping Use   Vaping status: Former  Substance and Sexual Activity   Alcohol use: Not Currently    Comment: rarely   Drug use: No   Sexual activity: Not Currently    Partners: Male  Other Topics Concern   Not on file  Social History Narrative   ** Merged History Encounter **       Social Drivers of Corporate investment banker Strain: Not on file  Food Insecurity: Not on file  Transportation Needs: Not on file  Physical Activity: Not on file  Stress: Not on file  Social Connections: Not on file  Intimate Partner Violence: Not on file      Review of Systems  Constitutional:  Positive for fatigue. Negative for chills and unexpected weight change.  HENT:  Negative for congestion, rhinorrhea, sneezing and sore throat.   Eyes:  Negative for redness.  Respiratory: Negative.  Negative for cough, chest tightness, shortness of breath and wheezing.   Cardiovascular: Negative.  Negative for chest pain and palpitations.  Gastrointestinal:  Negative for abdominal pain, constipation, diarrhea, nausea and vomiting.   Endocrine: Positive for polydipsia and polyuria.  Genitourinary:  Negative for difficulty urinating, dysuria, flank pain, frequency, hematuria and urgency.  Musculoskeletal:  Positive for arthralgias and back pain. Negative for joint swelling and neck pain.  Skin:  Negative for rash.  Neurological:  Positive for headaches. Negative for tremors and numbness.  Hematological:  Negative for adenopathy. Does not bruise/bleed easily.  Psychiatric/Behavioral:  Negative for behavioral problems (Depression), sleep disturbance and suicidal ideas. The patient is not nervous/anxious.     Vital Signs: BP 138/80   Pulse 94   Temp 98.4 F (36.9 C)   Resp 16   Ht 5\' 7"  (1.702 m)   Wt 182 lb 12.8 oz (82.9 kg)   SpO2 94%   BMI 28.63 kg/m    Physical Exam Vitals reviewed.  Constitutional:      General: She is not in acute distress.    Appearance: Normal appearance. She is obese. She is not ill-appearing.  HENT:     Head: Normocephalic and atraumatic.  Eyes:     Pupils: Pupils are equal, round, and reactive to light.  Cardiovascular:     Rate and Rhythm: Normal rate and regular rhythm.  Pulmonary:     Effort: Pulmonary effort is normal. No respiratory distress.  Neurological:     Mental Status: She is alert and oriented to person, place, and time.  Psychiatric:        Mood and Affect: Mood normal.        Behavior: Behavior normal.        Assessment/Plan: 1. Type 2 diabetes mellitus with diabetic polyneuropathy, with long-term current use of insulin (HCC) (Primary) A1c is improved some but still elevated at 10.8 today. Previously was 12.9. taking medications as prescribed as often as she can remember to take them. Her lancing device is broken and this is the second one in a row. Will switch to single use spring loaded safety lancets so that she can check her glucose and take her insulin more safely knowing what her glucose is before administering her insulin.  - POCT glycosylated  hemoglobin (Hb A1C) - Advocate Safety Lancets 26G MISC; Use 1 safety lancet to check glucose 4 times daily  for diabetes E11.65  Dispense: 100 each; Refill: 12  2. Hypertension associated with type 2 diabetes mellitus (HCC) Continue metoprolol, losartan and amlodipine as prescribed.   3. Hyperlipidemia associated with type 2 diabetes mellitus (HCC) Continue rosuvastatin, fish oil and gemfibrozil as prescribed.   4. Inflammatory pain of right heel Continue prn tramadol as prescribed.  - traMADol (ULTRAM) 50 MG tablet; TAKE ONE TABLET BY MOUTH THREE TIMES DAILY AS NEEDED FOR MODERATE OR SEVERE PAIN  Dispense: 90 tablet; Refill: 2  5. Inflammatory pain of left heel Continue prn tramadol as prescribed  - traMADol (ULTRAM) 50 MG tablet; TAKE ONE TABLET BY MOUTH THREE TIMES DAILY AS NEEDED FOR MODERATE OR SEVERE PAIN  Dispense: 90 tablet; Refill: 2   General Counseling: Morgan Daniels verbalizes understanding of the findings of todays visit and agrees with plan of treatment. I have discussed any further diagnostic evaluation that may be needed or ordered today. We also reviewed her medications today. she has been encouraged to call the office with any questions or concerns that should arise related to todays visit.    Orders Placed This Encounter  Procedures   POCT glycosylated hemoglobin (Hb A1C)    Meds ordered this encounter  Medications   Advocate Safety Lancets 26G MISC    Sig: Use 1 safety lancet to check glucose 4 times daily  for diabetes E11.65    Dispense:  100 each  Refill:  12    Please send patient advocate safety lancets or equivalent generic safety lancets that are spring-loaded. Paient needs easy to use lancing devices.   traMADol (ULTRAM) 50 MG tablet    Sig: TAKE ONE TABLET BY MOUTH THREE TIMES DAILY AS NEEDED FOR MODERATE OR SEVERE PAIN    Dispense:  90 tablet    Refill:  2    PT NEEDS REFILLS ASAP **BUBBLE PACK PT///    Return in about 3 months (around 07/08/2023) for  F/U, Trenyce Loera PCP.   Total time spent:30 Minutes Time spent includes review of chart, medications, test results, and follow up plan with the patient.   [place] Controlled Substance Database was reviewed by me.  This patient was seen by Sallyanne Kuster, FNP-C in collaboration with Dr. Beverely Risen as a part of collaborative care agreement.   Shamika Pedregon R. Tedd Sias, MSN, FNP-C Internal medicine

## 2023-05-14 ENCOUNTER — Emergency Department

## 2023-05-14 ENCOUNTER — Observation Stay
Admission: EM | Admit: 2023-05-14 | Discharge: 2023-05-16 | Disposition: A | Discharge status: Home / Self Care | Attending: Student | Admitting: Student

## 2023-05-14 ENCOUNTER — Inpatient Hospital Stay

## 2023-05-14 ENCOUNTER — Other Ambulatory Visit: Payer: Self-pay

## 2023-05-14 ENCOUNTER — Encounter: Payer: Self-pay | Admitting: *Deleted

## 2023-05-14 DIAGNOSIS — Z7982 Long term (current) use of aspirin: Secondary | ICD-10-CM | POA: Diagnosis not present

## 2023-05-14 DIAGNOSIS — I152 Hypertension secondary to endocrine disorders: Secondary | ICD-10-CM | POA: Diagnosis present

## 2023-05-14 DIAGNOSIS — I739 Peripheral vascular disease, unspecified: Secondary | ICD-10-CM | POA: Diagnosis present

## 2023-05-14 DIAGNOSIS — Z87891 Personal history of nicotine dependence: Secondary | ICD-10-CM | POA: Insufficient documentation

## 2023-05-14 DIAGNOSIS — R002 Palpitations: Secondary | ICD-10-CM | POA: Diagnosis present

## 2023-05-14 DIAGNOSIS — M064 Inflammatory polyarthropathy: Secondary | ICD-10-CM | POA: Diagnosis not present

## 2023-05-14 DIAGNOSIS — N189 Chronic kidney disease, unspecified: Secondary | ICD-10-CM | POA: Insufficient documentation

## 2023-05-14 DIAGNOSIS — E1165 Type 2 diabetes mellitus with hyperglycemia: Secondary | ICD-10-CM | POA: Diagnosis not present

## 2023-05-14 DIAGNOSIS — Z951 Presence of aortocoronary bypass graft: Secondary | ICD-10-CM | POA: Diagnosis not present

## 2023-05-14 DIAGNOSIS — Z91040 Latex allergy status: Secondary | ICD-10-CM | POA: Diagnosis not present

## 2023-05-14 DIAGNOSIS — E1122 Type 2 diabetes mellitus with diabetic chronic kidney disease: Secondary | ICD-10-CM | POA: Diagnosis not present

## 2023-05-14 DIAGNOSIS — Z794 Long term (current) use of insulin: Secondary | ICD-10-CM | POA: Insufficient documentation

## 2023-05-14 DIAGNOSIS — Z955 Presence of coronary angioplasty implant and graft: Secondary | ICD-10-CM | POA: Insufficient documentation

## 2023-05-14 DIAGNOSIS — I129 Hypertensive chronic kidney disease with stage 1 through stage 4 chronic kidney disease, or unspecified chronic kidney disease: Secondary | ICD-10-CM | POA: Insufficient documentation

## 2023-05-14 DIAGNOSIS — I214 Non-ST elevation (NSTEMI) myocardial infarction: Secondary | ICD-10-CM | POA: Diagnosis not present

## 2023-05-14 DIAGNOSIS — I700 Atherosclerosis of aorta: Secondary | ICD-10-CM | POA: Insufficient documentation

## 2023-05-14 DIAGNOSIS — E781 Pure hyperglyceridemia: Secondary | ICD-10-CM | POA: Diagnosis not present

## 2023-05-14 DIAGNOSIS — N179 Acute kidney failure, unspecified: Secondary | ICD-10-CM | POA: Insufficient documentation

## 2023-05-14 DIAGNOSIS — J449 Chronic obstructive pulmonary disease, unspecified: Secondary | ICD-10-CM | POA: Diagnosis not present

## 2023-05-14 DIAGNOSIS — Z8673 Personal history of transient ischemic attack (TIA), and cerebral infarction without residual deficits: Secondary | ICD-10-CM

## 2023-05-14 DIAGNOSIS — Z7902 Long term (current) use of antithrombotics/antiplatelets: Secondary | ICD-10-CM | POA: Diagnosis not present

## 2023-05-14 DIAGNOSIS — R41841 Cognitive communication deficit: Secondary | ICD-10-CM | POA: Diagnosis not present

## 2023-05-14 LAB — CBC
HCT: 39.9 % (ref 36.0–46.0)
Hemoglobin: 13.2 g/dL (ref 12.0–15.0)
MCH: 26.3 pg (ref 26.0–34.0)
MCHC: 33.1 g/dL (ref 30.0–36.0)
MCV: 79.6 fL — ABNORMAL LOW (ref 80.0–100.0)
Platelets: 379 10*3/uL (ref 150–400)
RBC: 5.01 MIL/uL (ref 3.87–5.11)
RDW: 13.8 % (ref 11.5–15.5)
WBC: 8.2 10*3/uL (ref 4.0–10.5)
nRBC: 0 % (ref 0.0–0.2)

## 2023-05-14 LAB — BASIC METABOLIC PANEL WITH GFR
Anion gap: 11 (ref 5–15)
BUN: 27 mg/dL — ABNORMAL HIGH (ref 8–23)
CO2: 24 mmol/L (ref 22–32)
Calcium: 9.7 mg/dL (ref 8.9–10.3)
Chloride: 104 mmol/L (ref 98–111)
Creatinine, Ser: 1.12 mg/dL — ABNORMAL HIGH (ref 0.44–1.00)
GFR, Estimated: 56 mL/min — ABNORMAL LOW
Glucose, Bld: 210 mg/dL — ABNORMAL HIGH (ref 70–99)
Potassium: 3.8 mmol/L (ref 3.5–5.1)
Sodium: 139 mmol/L (ref 135–145)

## 2023-05-14 LAB — APTT: aPTT: 29 s (ref 24–36)

## 2023-05-14 LAB — TROPONIN I (HIGH SENSITIVITY)
Troponin I (High Sensitivity): 54 ng/L — ABNORMAL HIGH
Troponin I (High Sensitivity): 84 ng/L — ABNORMAL HIGH (ref <18)

## 2023-05-14 LAB — PROTIME-INR
INR: 1.1 (ref 0.8–1.2)
Prothrombin Time: 14 s (ref 11.4–15.2)

## 2023-05-14 LAB — BRAIN NATRIURETIC PEPTIDE: B Natriuretic Peptide: 50.6 pg/mL (ref 0.0–100.0)

## 2023-05-14 LAB — MAGNESIUM: Magnesium: 2.1 mg/dL (ref 1.7–2.4)

## 2023-05-14 LAB — CBG MONITORING, ED: Glucose-Capillary: 261 mg/dL — ABNORMAL HIGH (ref 70–99)

## 2023-05-14 MED ORDER — ASPIRIN 81 MG PO TBEC
81.0000 mg | DELAYED_RELEASE_TABLET | Freq: Every day | ORAL | Status: DC
  Administered 2023-05-15 – 2023-05-16 (×2): 81 mg via ORAL
  Filled 2023-05-14 (×2): qty 1

## 2023-05-14 MED ORDER — HEPARIN SODIUM (PORCINE) 5000 UNIT/ML IJ SOLN: 60.0000 [IU]/kg | Freq: Once | INTRAMUSCULAR | Status: DC

## 2023-05-14 MED ORDER — INSULIN GLARGINE-YFGN 100 UNIT/ML ~~LOC~~ SOLN
20.0000 [IU] | Freq: Every day | SUBCUTANEOUS | Status: DC
  Filled 2023-05-14: qty 0.2

## 2023-05-14 MED ORDER — ESCITALOPRAM OXALATE 10 MG PO TABS
10.0000 mg | ORAL_TABLET | Freq: Every day | ORAL | Status: DC
  Administered 2023-05-15 – 2023-05-16 (×2): 10 mg via ORAL
  Filled 2023-05-14 (×2): qty 1

## 2023-05-14 MED ORDER — IOHEXOL 350 MG/ML SOLN
75.0000 mL | Freq: Once | INTRAVENOUS | Status: AC | PRN
  Administered 2023-05-14: 75 mL via INTRAVENOUS

## 2023-05-14 MED ORDER — HEPARIN BOLUS VIA INFUSION
4000.0000 [IU] | Freq: Once | INTRAVENOUS | Status: AC
  Administered 2023-05-14: 4000 [IU] via INTRAVENOUS
  Filled 2023-05-14: qty 4000

## 2023-05-14 MED ORDER — ACETAMINOPHEN 325 MG PO TABS: 650.0000 mg | ORAL_TABLET | ORAL | Status: DC | PRN

## 2023-05-14 MED ORDER — ROSUVASTATIN CALCIUM 10 MG PO TABS
40.0000 mg | ORAL_TABLET | Freq: Every day | ORAL | Status: DC
  Administered 2023-05-15 – 2023-05-16 (×2): 40 mg via ORAL
  Filled 2023-05-14: qty 2
  Filled 2023-05-14: qty 4

## 2023-05-14 MED ORDER — NITROGLYCERIN 0.4 MG SL SUBL: 0.4000 mg | SUBLINGUAL_TABLET | SUBLINGUAL | Status: DC | PRN

## 2023-05-14 MED ORDER — ONDANSETRON HCL 4 MG/2ML IJ SOLN: 4.0000 mg | Freq: Four times a day (QID) | INTRAMUSCULAR | Status: DC | PRN

## 2023-05-14 MED ORDER — INSULIN ASPART 100 UNIT/ML IJ SOLN
0.0000 [IU] | Freq: Three times a day (TID) | INTRAMUSCULAR | Status: DC
  Administered 2023-05-15 (×2): 3 [IU] via SUBCUTANEOUS
  Administered 2023-05-15: 8 [IU] via SUBCUTANEOUS
  Administered 2023-05-16: 3 [IU] via SUBCUTANEOUS
  Administered 2023-05-16: 8 [IU] via SUBCUTANEOUS
  Filled 2023-05-14 (×5): qty 1

## 2023-05-14 MED ORDER — HEPARIN (PORCINE) 25000 UT/250ML-% IV SOLN
1000.0000 [IU]/h | INTRAVENOUS | Status: DC
  Administered 2023-05-14: 1000 [IU]/h via INTRAVENOUS
  Filled 2023-05-14: qty 250

## 2023-05-14 MED ORDER — MIRTAZAPINE 15 MG PO TABS
15.0000 mg | ORAL_TABLET | Freq: Every day | ORAL | Status: DC
  Administered 2023-05-14 – 2023-05-15 (×2): 15 mg via ORAL
  Filled 2023-05-14 (×2): qty 1

## 2023-05-14 MED ORDER — GEMFIBROZIL 600 MG PO TABS
600.0000 mg | ORAL_TABLET | Freq: Two times a day (BID) | ORAL | Status: DC
  Administered 2023-05-14: 600 mg via ORAL
  Filled 2023-05-14 (×2): qty 1

## 2023-05-14 MED ORDER — TRAMADOL HCL 50 MG PO TABS
50.0000 mg | ORAL_TABLET | Freq: Three times a day (TID) | ORAL | Status: DC | PRN
  Administered 2023-05-15 – 2023-05-16 (×3): 50 mg via ORAL
  Filled 2023-05-14 (×3): qty 1

## 2023-05-14 MED ORDER — AMLODIPINE BESYLATE 5 MG PO TABS: 10.0000 mg | ORAL_TABLET | Freq: Every day | ORAL | Status: DC

## 2023-05-14 MED ORDER — LACTATED RINGERS IV BOLUS
500.0000 mL | Freq: Once | INTRAVENOUS | Status: AC
  Administered 2023-05-14: 500 mL via INTRAVENOUS

## 2023-05-14 MED ORDER — INSULIN GLARGINE-YFGN 100 UNIT/ML ~~LOC~~ SOLN
10.0000 [IU] | Freq: Every day | SUBCUTANEOUS | Status: DC
  Administered 2023-05-14 – 2023-05-15 (×2): 10 [IU] via SUBCUTANEOUS
  Filled 2023-05-14 (×4): qty 0.1

## 2023-05-14 MED ORDER — ASPIRIN 81 MG PO CHEW
324.0000 mg | CHEWABLE_TABLET | Freq: Once | ORAL | Status: AC
  Administered 2023-05-14: 324 mg via ORAL
  Filled 2023-05-14: qty 4

## 2023-05-14 MED ORDER — GABAPENTIN 300 MG PO CAPS
600.0000 mg | ORAL_CAPSULE | Freq: Three times a day (TID) | ORAL | Status: DC
Start: 2023-05-14 — End: 2023-05-16
  Administered 2023-05-14 – 2023-05-16 (×5): 600 mg via ORAL
  Filled 2023-05-14 (×5): qty 2

## 2023-05-14 NOTE — Assessment & Plan Note (Signed)
 Long-term history of uncontrolled type 2 diabetes with last A1c of 10.8% approximately 1 month ago.  - Hold home regimen - SSI, moderate - Semglee 20 units at bedtime

## 2023-05-14 NOTE — Assessment & Plan Note (Signed)
-   Continue home amlodipine - Hold home losartan given elevated creatinine

## 2023-05-14 NOTE — Assessment & Plan Note (Addendum)
 Patient reports nonspecific vision changes that are not quite consistent with TIA or CVA, however given extensive history of CVA, will obtain an MRI of the brain to rule out.  - Continue home aspirin - Continue home statin

## 2023-05-14 NOTE — Assessment & Plan Note (Signed)
-   Continue home tramadol.  PDMP reviewed and appropriate

## 2023-05-14 NOTE — Assessment & Plan Note (Signed)
 -  Continue home regimen

## 2023-05-14 NOTE — H&P (Addendum)
 History and Physical    Patient: Morgan Daniels FIE:[id number] DOB: 1960-09-02 DOA: 05/14/2023 DOS: the patient was seen and examined on 05/14/2023 PCP: Sallyanne Kuster, NP  Patient coming from: Home  Chief Complaint:  Chief Complaint  Patient presents with   Palpitations   HPI: Morgan Daniels is a 63 y.o. female with medical history significant of uncontrolled type 2 diabetes, hypertension, hyperlipidemia, chronic pain on opioids, CVA, CAD s/p CABG (2022), COPD, PAD s/p left SFA stent, who presents to the ED due to palpitations.  Morgan Daniels states that starting yesterday, she had gradual onset vision changes bilaterally that she describes as blurry spotty vision.  The symptoms are intermittent but have continued throughout the day as well.  She took 2 aspirin last night as she was worried she was having another stroke and then went to bed.  Today, she began to experience sudden onset substernal chest pain without radiation that was accompanied by palpitations and diaphoresis.  She is unsure for how long the episode lasted, but stated it was enough to concern her.  At this time, her chest pain resolved but she continues to have intermittent palpitations.  She is compliant with her aspirin and Plavix without any recent missed doses.  She denies any shortness of breath or lower extremity swelling.  ED course: On arrival to the ED, patient was hypertensive at 158/88 with heart rate of 82.  She was saturating at 97% on room air.  She was afebrile at 98.2.  Initial workup notable for unremarkable CBC, glucose 210, BUN 27, creatinine 1.12, GFR 56.  Troponin 54 than 84.  Chest x-ray with no active disease.  CTA of the head/neck with no acute LVO.  Patient started on heparin infusion and aspirin.  TRH contacted for admission.  Review of Systems: As mentioned in the history of present illness. All other systems reviewed and are negative.  Past Medical History:  Diagnosis Date   Anxiety    Arthritis     Bilateral carotid artery stenosis 2014   Carotid artery occlusion    Chronic kidney disease 06/18/2015   UTI   Chronic kidney disease 2017   Current smoker    CVA (cerebral vascular accident) (HCC) 2013   Depression    Diabetes (HCC)    Diverticulosis    Fatty liver    Fibromyalgia    GERD (gastroesophageal reflux disease)    H/O hiatal hernia    Heart attack (HCC) 01/12/2021   Hiatal hernia    Hypercholesteremia    Hypertension    IBS (irritable bowel syndrome)    PAD (peripheral artery disease) (HCC)    Peptic ulcer    Plantar fasciitis    Stroke (HCC) 01/31/2012   Right side-ministroke   T2DM (type 2 diabetes mellitus) (HCC)    Past Surgical History:  Procedure Laterality Date   ABDOMINAL HYSTERECTOMY  02/18/1988   BACK SURGERY  2000, 2004   CAROTID ENDARTERECTOMY Left 05/06/2012   CARPAL TUNNEL RELEASE Left 07/18/2015   Procedure: CARPAL TUNNEL RELEASE;  Surgeon: Deeann Saint, MD;  Location: [facility] ORS;  Service: Orthopedics;  Laterality: Left;   CEREBRAL ANGIOGRAM Bilateral 05/03/2012   Procedure: CEREBRAL ANGIOGRAM;  Surgeon: Chuck Hint, MD;  Location: [facility] CATH LAB;  Service: Cardiovascular;  Laterality: Bilateral;   CHOLECYSTECTOMY  02/18/1999   COLONOSCOPY WITH PROPOFOL N/A 11/19/2018   Procedure: COLONOSCOPY WITH PROPOFOL;  Surgeon: Midge Minium, MD;  Location: [facility] SURGERY CNTR;  Service: Endoscopy;  Laterality: N/A;  Diabetic -  insulin   CORONARY ARTERY BYPASS GRAFT N/A 01/18/2021   Procedure: CORONARY ARTERY BYPASS GRAFTING (CABG) x 4  USING LEFT INTERNAL MAMMARY ARTERY AND LEFT ENDOSCOPIC GREATER SAPHENOUS VEIN CONDUITS;  Surgeon: Loreli Slot, MD;  Location: [facility] OR;  Service: Open Heart Surgery;  Laterality: N/A;   CORONARY/GRAFT ACUTE MI REVASCULARIZATION N/A 01/13/2021   Procedure: Coronary/Graft Acute MI Revascularization;  Surgeon: Marcina Millard, MD;  Location: [facility] INVASIVE CV LAB;  Service: Cardiovascular;  Laterality: N/A;    ENDARTERECTOMY Left 05/06/2012   Procedure: ENDARTERECTOMY CAROTID;  Surgeon: Chuck Hint, MD;  Location: [facility] OR;  Service: Vascular;  Laterality: Left;   ENDARTERECTOMY Right 08/09/2013   Procedure: ENDARTERECTOMY CAROTID-RIGHT;  Surgeon: Chuck Hint, MD;  Location: [facility] OR;  Service: Vascular;  Laterality: Right;   ENDOVEIN HARVEST OF GREATER SAPHENOUS VEIN Left 01/18/2021   Procedure: ENDOVEIN HARVEST OF GREATER SAPHENOUS VEIN;  Surgeon: Loreli Slot, MD;  Location: [facility] OR;  Service: Open Heart Surgery;  Laterality: Left;   HERNIA REPAIR     IABP INSERTION Right 01/13/2021   Procedure: IABP Insertion;  Surgeon: Marcina Millard, MD;  Location: [facility] INVASIVE CV LAB;  Service: Cardiovascular;  Laterality: Right;   LEFT HEART CATH AND CORONARY ANGIOGRAPHY N/A 01/13/2021   Procedure: LEFT HEART CATH AND CORONARY ANGIOGRAPHY;  Surgeon: Marcina Millard, MD;  Location: [facility] INVASIVE CV LAB;  Service: Cardiovascular;  Laterality: N/A;   LOWER EXTREMITY ANGIOGRAPHY Left 01/03/2021   Procedure: LOWER EXTREMITY ANGIOGRAPHY;  Surgeon: Annice Needy, MD;  Location: [facility] INVASIVE CV LAB;  Service: Cardiovascular;  Laterality: Left;   LOWER EXTREMITY ANGIOGRAPHY Left 02/28/2021   Procedure: LOWER EXTREMITY ANGIOGRAPHY;  Surgeon: Annice Needy, MD;  Location: [facility] INVASIVE CV LAB;  Service: Cardiovascular;  Laterality: Left;   PATCH ANGIOPLASTY Left 05/06/2012   Procedure: WITH DACRON PATCH ANGIOPLASTY ;  Surgeon: Chuck Hint, MD;  Location: [facility] OR;  Service: Vascular;  Laterality: Left;   POLYPECTOMY  11/19/2018   Procedure: POLYPECTOMY;  Surgeon: Midge Minium, MD;  Location: [facility]ERY [facility];  Service: Endoscopy;;   SPINE SURGERY  02/17/2002   TEE WITHOUT CARDIOVERSION N/A 01/18/2021   Procedure: TRANSESOPHAGEAL ECHOCARDIOGRAM (TEE);  Surgeon: Loreli Slot, MD;  Location: [facility] OR;  Service: Open Heart Surgery;  Laterality: N/A;   TONSILLECTOMY     TUBAL  LIGATION     Social History:  reports that she has quit smoking. Her smoking use included cigarettes. She started smoking about 52 years ago. She has a 52.5 pack-year smoking history. She has been exposed to tobacco smoke. She has never used smokeless tobacco. She reports that she does not currently use alcohol. She reports that she does not use drugs.  Allergies  Allergen Reactions   Simvastatin Diarrhea and Nausea And Vomiting   Morphine And Codeine Itching   Lactose Intolerance (Gi) Diarrhea    bloating   Latex Rash    Rash when she wears gloves (Negative by test - per pt)    Family History  Problem Relation Age of Onset   Hypertension Mother    Hyperlipidemia Mother    Deep vein thrombosis Mother    Cancer Father    Alcohol abuse Father    Heart failure Father    Hypertension Maternal Grandmother    Deep vein thrombosis Sister    Alcohol abuse Sister    Alcohol abuse Sister     Prior to Admission medications   Medication Sig Start Date End Date Taking? Authorizing Provider  Advocate Safety Lancets 26G MISC Use 1 safety lancet to check glucose 4 times daily  for diabetes E11.65 04/14/23   Sallyanne Kuster, NP  Alirocumab (PRALUENT) 150 MG/ML SOAJ Inject 1 mL (150 mg total) into the skin every 30 (thirty) days. 06/03/22   Sallyanne Kuster, NP  amLODipine (NORVASC) 10 MG tablet Take 1 tablet (10 mg total) by mouth daily. 10/23/22   Sallyanne Kuster, NP  aspirin 81 MG chewable tablet Chew 1 tablet (81 mg total) by mouth daily. 01/31/21   Angiulli, Mcarthur Rossetti, PA-C  Cholecalciferol (VITAMIN D-3) 125 MCG (5000 UT) TABS Take 5,000 Units by mouth daily. 01/31/21   Angiulli, Mcarthur Rossetti, PA-C  clopidogrel (PLAVIX) 75 MG tablet Take 75 mg by mouth daily. 12/28/21   [provider]  dicyclomine (BENTYL) 10 MG capsule TAKE 1 CAPSULE BY MOUTH THREE TIMES DAILY BEFORE MEALS 11/14/22   Sallyanne Kuster, NP  empagliflozin (JARDIANCE) 25 MG TABS tablet Take 1 tablet (25 mg total) by mouth  daily before breakfast. 03/18/23   Sallyanne Kuster, NP  escitalopram (LEXAPRO) 10 MG tablet Take 10 mg by mouth daily.    [provider]  gabapentin (NEURONTIN) 600 MG tablet TAKE ONE TABLET BY MOUTH 3 TIMES DAILY 02/09/23   Sallyanne Kuster, NP  gemfibrozil (LOPID) 600 MG tablet Take 1 tablet (600 mg total) by mouth 2 (two) times daily. 03/18/23   Sallyanne Kuster, NP  glipiZIDE (GLUCOTROL XL) 5 MG 24 hr tablet Take 1 tablet (5 mg total) by mouth daily with breakfast. 03/18/23   Sallyanne Kuster, NP  glucose blood (ACCU-CHEK GUIDE) test strip Use 1 test strip to check glucose before meals, at bedtime and prn with glucose meter 06/24/22   Abernathy, Arlyss Repress, NP  insulin aspart (FIASP) 100 UNIT/ML FlexTouch Pen Inject insulin into skin 3 times daily with meals per sliding scale provided to patient. Max dose: 63 units per 24 hours 11/24/22   Sallyanne Kuster, NP  insulin degludec (TRESIBA FLEXTOUCH) 200 UNIT/ML FlexTouch Pen Inject 40 Units into the skin at bedtime. Increase by 4 units every 4 days if AM sugar >200. 12/02/22   Abernathy, Arlyss Repress, NP  losartan (COZAAR) 25 MG tablet Take 1 tablet (25 mg total) by mouth daily. 03/18/23   Sallyanne Kuster, NP  metoprolol succinate (TOPROL-XL) 50 MG 24 hr tablet TAKE 1 TABLET BY MOUTH EVERY DAY 08/19/22   Sallyanne Kuster, NP  mirtazapine (REMERON) 15 MG tablet TAKE ONE TABLET BY MOUTH AT BEDTIME 02/09/23   Sallyanne Kuster, NP  nitroGLYCERIN (NITRODUR - DOSED IN MG/24 HR) 0.2 mg/hr patch Place 1 patch (0.2 mg total) onto the skin daily. 07/10/21 07/10/22  Horton Chin, MD  pentoxifylline (TRENTAL) 400 MG CR tablet Take 400 mg by mouth 2 (two) times daily. 12/02/21   [provider]  rosuvastatin (CRESTOR) 40 MG tablet TAKE 1 TABLET BY MOUTH EVERYDAY 11/14/22   Abernathy, Alyssa, NP  tiZANidine (ZANAFLEX) 4 MG tablet TAKE 1 TABLET BY MOUTH TWICE DAILY AS NEEDED FOR MUSCLE PAIN AND SPASMS 05/19/22   Abernathy, Arlyss Repress, NP  traMADol (ULTRAM)  50 MG tablet TAKE ONE TABLET BY MOUTH THREE TIMES DAILY AS NEEDED FOR MODERATE OR SEVERE PAIN 04/14/23   Sallyanne Kuster, NP  ULTICARE MICRO PEN NEEDLES 32G X 4 MM MISC TO USE WITH INSULIN DOSING THREE TIMES DAILY PRIOR TO MEALS AND AT BEDTIME FOR BASAL INSULIN. 12/24/20   [provider]  VASCEPA 1 g capsule TAKE 1 CAPSULE BY MOUTH 2 TIMES DAILY 12/16/22  Lyndon Code, MD    Physical Exam: Vitals:   05/14/23 1507 05/14/23 1508 05/14/23 1730 05/14/23 1909  BP: (!) 159/87 (!) 158/88    Pulse: 82 82    Resp: 18 13    Temp: 98.2 F (36.8 C)     TempSrc: Oral     SpO2: 93% 97%    Weight:   88.7 kg 88.7 kg  Height:    5\' 7"  (1.702 m)   Physical Exam Vitals and nursing note reviewed.  Constitutional:      General: She is not in acute distress.    Appearance: She is normal weight. She is not toxic-appearing.  HENT:     Head: Normocephalic and atraumatic.     Mouth/Throat:     Mouth: Mucous membranes are moist.     Pharynx: Oropharynx is clear.  Eyes:     Conjunctiva/sclera: Conjunctivae normal.     Pupils: Pupils are equal, round, and reactive to light.  Cardiovascular:     Rate and Rhythm: Normal rate and regular rhythm.     Heart sounds: No murmur heard.    No gallop.  Pulmonary:     Effort: Pulmonary effort is normal. No respiratory distress.     Breath sounds: Normal breath sounds. No wheezing, rhonchi or rales.  Abdominal:     General: Bowel sounds are normal. There is no distension.     Palpations: Abdomen is soft.     Tenderness: There is no abdominal tenderness. There is no guarding.  Musculoskeletal:     Right lower leg: No edema.     Left lower leg: No edema.  Skin:    General: Skin is warm and dry.  Neurological:     General: No focal deficit present.     Mental Status: She is alert and oriented to person, place, and time. Mental status is at baseline.     Comments:  No focal visual deficits.  Strength is intact and equal throughout.  Sensation  grossly intact throughout  Psychiatric:        Mood and Affect: Mood normal.        Behavior: Behavior normal.    Data Reviewed: CBC with WBC of 8.2, hemoglobin of 13.2, MCV of 79, platelets of 379 BMP with sodium 139, potassium 3.8, bicarb 24, glucose 210, BUN 27, creat 1.12, anion gap 11, GFR 56 Troponin 54 than 84 INR 1.1  EKG personally reviewed.  Sinus rhythm with rate of 93.  No acute ischemic changes.  CT ANGIO HEAD NECK W WO CM Result Date: 05/14/2023 CLINICAL DATA:  63 year old female with chest pain and palpitations, feeling unwell. Vision changes, feels like she is leaning to the left. EXAM: CT ANGIOGRAPHY HEAD AND NECK WITH AND WITHOUT CONTRAST TECHNIQUE: Multidetector CT imaging of the head and neck was performed using the standard protocol during bolus administration of intravenous contrast. Multiplanar CT image reconstructions and MIPs were obtained to evaluate the vascular anatomy. Carotid stenosis measurements (when applicable) are obtained utilizing NASCET criteria, using the distal internal carotid diameter as the denominator. RADIATION DOSE REDUCTION: This exam was performed according to the departmental dose-optimization program which includes automated exposure control, adjustment of the mA and/or kV according to patient size and/or use of iterative reconstruction technique. CONTRAST:  75mL OMNIPAQUE IOHEXOL 350 MG/ML SOLN COMPARISON:  Brain MRI 09/18/2021. Head CT 02/01/2022. CTA head and neck 09/18/2021. FINDINGS: CT HEAD Brain: Chronic small vessel disease. Patchy and confluent bilateral cerebral white matter hypodensity with chronic lacunar  infarcts in the bilateral corona radiata, deep gray nuclei. Stable non contrast CT appearance of the brain. Since 2023. No midline shift, ventriculomegaly, mass effect, evidence of mass lesion, intracranial hemorrhage or evidence of cortically based acute infarction. Calvarium and skull base: Stable, intact. Paranasal sinuses: Visualized  paranasal sinuses and mastoids are stable and well aerated. Orbits: Visualized orbits and scalp soft tissues are within normal limits. CTA NECK Skeleton: Chronic sternotomy. Mild for age cervical spine degeneration. No acute osseous abnormality identified. Upper chest: CABG.  Otherwise negative. Other neck: Neck soft tissue spaces are stable since 2023 and within normal limits. Aortic arch: Extensive Calcified aortic atherosclerosis. Three vessel arch. Right carotid system: Brachiocephalic artery and right CCA plaque without stenosis. Capacious right carotid bifurcation and regional surgical clips suggesting chronic endarterectomy. Residual soft and calcified plaque along the posterior and lateral bifurcation is stable without stenosis. Mostly calcified plaque beginning distal to the right ICA bulb and continuing to the skull base is stable without stenosis. Left carotid system: Extensive left CCA origin plaque appears stable without stenosis. Left CCA plaque continues to the left carotid bifurcation. Chronic low-density plaque along the medial vessel just proximal to the bifurcation on series 6, image 115. More calcified plaque at the bifurcation. But no associated stenosis. Combined soft and calcified plaque along the medial and posterior vessel just below the skull base is stable and without stenosis (series 6, image 78). Vertebral arteries: Right subclavian origin plaque without stenosis. Right vertebral artery remains patent. With intermittent atherosclerosis to the skull base. Only mild stenosis results. Proximal left subclavian atherosclerosis without stenosis. Left vertebral artery origin is patent. Calcified plaque beginning in the left V1 segment. Left vertebral artery appears mildly non dominant but stable since 2023. Mild associated stenosis along its course to the skull base including in the V3 segment from soft and calcified plaque. CTA HEAD Posterior circulation: Distal vertebral arteries and  vertebrobasilar junction, PICA origins remain patent with mild irregularity. Patent basilar artery. Mild basilar artery irregularity and mild mid basilar stenosis (series 15, image 21). Distal basilar, basilar tip, SCA and PCA origins remain patent. Left posterior communicating artery contributes to the PCA on that side, diminutive or absent right posterior communicating. Right PCA branches are patent with mild irregularity. On the left there is moderate short segment irregularity and stenosis of the P2 which is progressed since 2023 on series 11, image 23. Anterior circulation: Both ICA siphons are patent. On the left there is moderate to severe calcified plaque resulting in moderate distal cavernous stenosis. Left posterior communicating artery origin is normal. Right side similar calcified plaque, moderate distal cavernous and proximal supraclinoid stenosis. These appear stable since 2023. Patent carotid termini, MCA and ACA origins. Anterior communicating artery with median artery of the corpus callosum and bilateral ACA branches are within normal limits. MCA M1 segments and MCA trifurcations are patent without stenosis. Bilateral MCA branches are mildly irregular. Venous sinuses: Early contrast timing, not well evaluated. Anatomic variants: Mildly dominant right vertebral artery, fetal type left PCA origin. Review of the MIP images confirms the above findings IMPRESSION: 1. 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 Negative for large vessel occlusion. Suspected previous right carotid endarterectomy. 2. Stable atherosclerosis since the 2023 CTA except for Moderate stenosis left PCA P2 segment is new/increased. No severe stenosis is identified. Up to moderate stenosis also in both ICA siphons. 3.  Stable CT appearance of chronic cerebral small vessel disease. 4. CABG.  Aortic Atherosclerosis (ICD10-I70.0). Electronically Signed   By:  Odessa Fleming M.D.   On: 05/14/2023 16:55   DG Chest 2 View Result  Date: 05/14/2023 CLINICAL DATA:  Chest pain and palpitations. EXAM: CHEST - 2 VIEW COMPARISON:  02/01/2022. FINDINGS: Bilateral lung fields are clear. Bilateral costophrenic angles are clear. Normal cardio-mediastinal silhouette. There are surgical staples along the heart border and sternotomy wires, status post CABG (coronary artery bypass graft). Loop recorder device noted overlying the left lower anterior chest wall. No acute osseous abnormalities. The soft tissues are within normal limits. IMPRESSION: No active cardiopulmonary disease. Electronically Signed   By: Jules Schick M.D.   On: 05/14/2023 15:18   Results are pending, will review when available.  Assessment and Plan:  * NSTEMI (non-ST elevated myocardial infarction) [facility]) Patient is presenting with sudden onset chest pain earlier today accompanied by palpitations and diaphoresis.  Chest pain has resolved without recurrence but she continues to have palpitations on and off. Given patient's high risk history including extensive vascular disease with modest troponin leak, heparin infusion has been initiated.  EKG stable without acute ischemic changes.  - Cardiology consulted; appreciate their recommendations - Telemetry monitoring - Continue heparin infusion per pharmacy dosing - Continue home aspirin - Continue home statin - Nitroglycerin as needed for recurrent chest pain - Echocardiogram ordered  History of CVA (cerebrovascular accident) Patient reports nonspecific vision changes that are not quite consistent with TIA or CVA, however given extensive history of CVA, will obtain an MRI of the brain to rule out.  - Continue home aspirin - Continue home statin  AKI (acute kidney injury) (HCC) Unclear etiology as patient does not report poor p.o. intake.  Per chart review, it seems she has frequent fluctuations of her creatinine up to the 1.2.  - 500 cc bolus - Recheck BMP in the a.m.  Type 2 diabetes mellitus with  hyperglycemia, with long-term current use of insulin (HCC) Long-term history of uncontrolled type 2 diabetes with last A1c of 10.8% approximately 1 month ago.  - Hold home regimen - SSI, moderate - Semglee 20 units at bedtime  PAD (peripheral artery disease) (HCC) - Continue home regimen  Hypertension associated with type 2 diabetes mellitus (HCC) - Continue home amlodipine - Hold home losartan given elevated creatinine  Inflammatory polyarthritis (HCC) - Continue home tramadol.  PDMP reviewed and appropriate  Advance Care Planning:   Code Status: Full Code   Consults: Cardiology  Family Communication: No family at bedside   Severity of Illness: The appropriate patient status for this patient is INPATIENT. Inpatient status is judged to be reasonable and necessary in order to provide the required intensity of service to ensure the patient's safety. The patient's presenting symptoms, physical exam findings, and initial radiographic and laboratory data in the context of their chronic comorbidities is felt to place them at high risk for further clinical deterioration. Furthermore, it is not anticipated that the patient will be medically stable for discharge from the hospital within 2 midnights of admission.   * I certify that at the point of admission it is my clinical judgment that the patient will require inpatient hospital care spanning beyond 2 midnights from the point of admission due to high intensity of service, high risk for further deterioration and high frequency of surveillance required.*  Author: Verdene Lennert, MD 05/14/2023 7:32 PM  For on call review [email or web address].

## 2023-05-14 NOTE — Progress Notes (Signed)
 PHARMACY - ANTICOAGULATION CONSULT NOTE  Pharmacy Consult for heparin infusion  Indication: chest pain/ACS  Allergies  Allergen Reactions   Simvastatin Diarrhea and Nausea And Vomiting   Morphine And Codeine Itching   Lactose Intolerance (Gi) Diarrhea    bloating   Latex Rash    Rash when she wears gloves (Negative by test - per pt)    Patient Measurements: Weight: 88.7 kg (195 lb 9.6 oz)  Vital Signs: Temp: 98.2 F (36.8 C) (03/27 1507) Temp Source: Oral (03/27 1507) BP: 158/88 (03/27 1508) Pulse Rate: 82 (03/27 1508)  Labs: Recent Labs    05/14/23 1425 05/14/23 1537 05/14/23 1634  HGB 13.2  --   --   HCT 39.9  --   --   PLT 379  --   --   APTT  --  29  --   LABPROT  --  14.0  --   INR  --  1.1  --   CREATININE 1.12*  --   --   TROPONINIHS 54*  --  84*    Estimated Creatinine Clearance: 59.5 mL/min (A) (by C-G formula based on SCr of 1.12 mg/dL (H)).   Medical History: Past Medical History:  Diagnosis Date   Anxiety    Arthritis    Bilateral carotid artery stenosis 2014   Carotid artery occlusion    Chronic kidney disease 06/18/2015   UTI   Chronic kidney disease 2017   Current smoker    CVA (cerebral vascular accident) (HCC) 2013   Depression    Diabetes (HCC)    Diverticulosis    Fatty liver    Fibromyalgia    GERD (gastroesophageal reflux disease)    H/O hiatal hernia    Heart attack (HCC) 01/12/2021   Hiatal hernia    Hypercholesteremia    Hypertension    IBS (irritable bowel syndrome)    PAD (peripheral artery disease) (HCC)    Peptic ulcer    Plantar fasciitis    Stroke (HCC) 01/31/2012   Right side-ministroke   T2DM (type 2 diabetes mellitus) (HCC)     Assessment: 63 yo female with admitted with sharp chest pain and palpitations. Patient has PMH of CVA, PAD, CAD s/p CABG in 2022. Pharmacy consulted for heparin infusion dosing and monitoring for NSTEMI.   Troponin HS 54>> 84  Goal of Therapy:  Heparin level 0.3-0.7  units/ml Monitor platelets by anticoagulation protocol: Yes   Plan:  Give 4000 units bolus x 1 Start heparin infusion at 1000 units/hr Check anti-Xa level in 6 hours and daily while on heparin Continue to monitor H&H and platelets  Gardner Candle, PharmD, BCPS Clinical Pharmacist 05/14/2023 5:38 PM

## 2023-05-14 NOTE — Assessment & Plan Note (Signed)
 Unclear etiology as patient does not report poor p.o. intake.  Per chart review, it seems she has frequent fluctuations of her creatinine up to the 1.2.  - 500 cc bolus - Recheck BMP in the a.m.

## 2023-05-14 NOTE — Assessment & Plan Note (Addendum)
 Patient is presenting with sudden onset chest pain earlier today accompanied by palpitations and diaphoresis.  Chest pain has resolved without recurrence but she continues to have palpitations on and off. Given patient's high risk history including extensive vascular disease with modest troponin leak, heparin infusion has been initiated.  EKG stable without acute ischemic changes.  - Cardiology consulted; appreciate their recommendations - Telemetry monitoring - Continue heparin infusion per pharmacy dosing - Continue home aspirin - Continue home statin - Nitroglycerin as needed for recurrent chest pain - Echocardiogram ordered

## 2023-05-14 NOTE — ED Provider Notes (Signed)
 [facility] Provider Note    Event Date/Time   First MD Initiated Contact with Patient 05/14/23 1502     (approximate)   History   Palpitations   HPI  Morgan Daniels is a 63 y.o. female who presents to the ED for evaluation of Palpitations   Review a neurology clinic visit from 3 weeks ago.  History of CAD s/p CABGx4 2022, PAD, bilateral carotid endarterectomy about 10 years ago, CVA.  Severe bilateral lower extremity polyneuropathy.  DAPT with aspirin and Plavix  Patient presents with her husband for evaluation of about 24 hours of intermittent vision changes, unsteadiness and sharp chest discomfort.  Reports yesterday she had intermittent vision changes, blurriness and feeling like she was "looking to the left."  Denies any weakness to the extremities, falls or syncope.   Today just prior to arrival she had about 15 minutes of chest discomfort that has since resolved.   Physical Exam   Triage Vital Signs: ED Triage Vitals [05/14/23 1507]  Encounter Vitals Group     BP (!) 159/87     Systolic BP Percentile      Diastolic BP Percentile      Pulse Rate 82     Resp 18     Temp 98.2 F (36.8 C)     Temp Source Oral     SpO2 93 %     Weight      Height      Head Circumference      Peak Flow      Pain Score      Pain Loc      Pain Education      Exclude from Growth Chart     Most recent vital signs: Vitals:   05/14/23 1507 05/14/23 1508  BP: (!) 159/87 (!) 158/88  Pulse: 82 82  Resp: 18 13  Temp: 98.2 F (36.8 C)   SpO2: 93% 97%    General: Awake, no distress.  Flat affect, quiet CV:  Good peripheral perfusion.  Resp:  Normal effort.  Clear Abd:  No distention.  Soft MSK:  No deformity noted.  No peripheral edema or signs of trauma Neuro:  No focal deficits appreciated. Other:     ED Results / Procedures / Treatments   Labs (all labs ordered are listed, but only abnormal results are displayed) Labs Reviewed  BASIC  METABOLIC PANEL WITH GFR - Abnormal; Notable for the following components:      Result Value   Glucose, Bld 210 (*)    BUN 27 (*)    Creatinine, Ser 1.12 (*)    GFR, Estimated 56 (*)    All other components within normal limits  CBC - Abnormal; Notable for the following components:   MCV 79.6 (*)    All other components within normal limits  TROPONIN I (HIGH SENSITIVITY) - Abnormal; Notable for the following components:   Troponin I (High Sensitivity) 54 (*)    All other components within normal limits  TROPONIN I (HIGH SENSITIVITY) - Abnormal; Notable for the following components:   Troponin I (High Sensitivity) 84 (*)    All other components within normal limits  MAGNESIUM  PROTIME-INR  APTT  HEPARIN LEVEL (UNFRACTIONATED)  CBC    EKG Sinus rhythm with a rate of 93 bpm.  Normal axis and intervals.  Nonspecific changes without clear signs of acute ischemia.  Stigmata of LVH  RADIOLOGY CXR interpreted by me without evidence of acute cardiopulmonary pathology.  Official radiology report(s): CT ANGIO HEAD NECK W WO CM Result Date: 05/14/2023 CLINICAL DATA:  63 year old female with chest pain and palpitations, feeling unwell. Vision changes, feels like she is leaning to the left. EXAM: CT ANGIOGRAPHY HEAD AND NECK WITH AND WITHOUT CONTRAST TECHNIQUE: Multidetector CT imaging of the head and neck was performed using the standard protocol during bolus administration of intravenous contrast. Multiplanar CT image reconstructions and MIPs were obtained to evaluate the vascular anatomy. Carotid stenosis measurements (when applicable) are obtained utilizing NASCET criteria, using the distal internal carotid diameter as the denominator. RADIATION DOSE REDUCTION: This exam was performed according to the departmental dose-optimization program which includes automated exposure control, adjustment of the mA and/or kV according to patient size and/or use of iterative reconstruction technique.  CONTRAST:  75mL OMNIPAQUE IOHEXOL 350 MG/ML SOLN COMPARISON:  Brain MRI 09/18/2021. Head CT 02/01/2022. CTA head and neck 09/18/2021. FINDINGS: CT HEAD Brain: Chronic small vessel disease. Patchy and confluent bilateral cerebral white matter hypodensity with chronic lacunar infarcts in the bilateral corona radiata, deep gray nuclei. Stable non contrast CT appearance of the brain. Since 2023. No midline shift, ventriculomegaly, mass effect, evidence of mass lesion, intracranial hemorrhage or evidence of cortically based acute infarction. Calvarium and skull base: Stable, intact. Paranasal sinuses: Visualized paranasal sinuses and mastoids are stable and well aerated. Orbits: Visualized orbits and scalp soft tissues are within normal limits. CTA NECK Skeleton: Chronic sternotomy. Mild for age cervical spine degeneration. No acute osseous abnormality identified. Upper chest: CABG.  Otherwise negative. Other neck: Neck soft tissue spaces are stable since 2023 and within normal limits. Aortic arch: Extensive Calcified aortic atherosclerosis. Three vessel arch. Right carotid system: Brachiocephalic artery and right CCA plaque without stenosis. Capacious right carotid bifurcation and regional surgical clips suggesting chronic endarterectomy. Residual soft and calcified plaque along the posterior and lateral bifurcation is stable without stenosis. Mostly calcified plaque beginning distal to the right ICA bulb and continuing to the skull base is stable without stenosis. Left carotid system: Extensive left CCA origin plaque appears stable without stenosis. Left CCA plaque continues to the left carotid bifurcation. Chronic low-density plaque along the medial vessel just proximal to the bifurcation on series 6, image 115. More calcified plaque at the bifurcation. But no associated stenosis. Combined soft and calcified plaque along the medial and posterior vessel just below the skull base is stable and without stenosis (series  6, image 78). Vertebral arteries: Right subclavian origin plaque without stenosis. Right vertebral artery remains patent. With intermittent atherosclerosis to the skull base. Only mild stenosis results. Proximal left subclavian atherosclerosis without stenosis. Left vertebral artery origin is patent. Calcified plaque beginning in the left V1 segment. Left vertebral artery appears mildly non dominant but stable since 2023. Mild associated stenosis along its course to the skull base including in the V3 segment from soft and calcified plaque. CTA HEAD Posterior circulation: Distal vertebral arteries and vertebrobasilar junction, PICA origins remain patent with mild irregularity. Patent basilar artery. Mild basilar artery irregularity and mild mid basilar stenosis (series 15, image 21). Distal basilar, basilar tip, SCA and PCA origins remain patent. Left posterior communicating artery contributes to the PCA on that side, diminutive or absent right posterior communicating. Right PCA branches are patent with mild irregularity. On the left there is moderate short segment irregularity and stenosis of the P2 which is progressed since 2023 on series 11, image 23. Anterior circulation: Both ICA siphons are patent. On the left there is moderate to severe calcified plaque  resulting in moderate distal cavernous stenosis. Left posterior communicating artery origin is normal. Right side similar calcified plaque, moderate distal cavernous and proximal supraclinoid stenosis. These appear stable since 2023. Patent carotid termini, MCA and ACA origins. Anterior communicating artery with median artery of the corpus callosum and bilateral ACA branches are within normal limits. MCA M1 segments and MCA trifurcations are patent without stenosis. Bilateral MCA branches are mildly irregular. Venous sinuses: Early contrast timing, not well evaluated. Anatomic variants: Mildly dominant right vertebral artery, fetal type left PCA origin. Review  of the MIP images confirms the above findings IMPRESSION: 1. 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 Negative for large vessel occlusion. Suspected previous right carotid endarterectomy. 2. Stable atherosclerosis since the 2023 CTA except for Moderate stenosis left PCA P2 segment is new/increased. No severe stenosis is identified. Up to moderate stenosis also in both ICA siphons. 3.  Stable CT appearance of chronic cerebral small vessel disease. 4. CABG.  Aortic Atherosclerosis (ICD10-I70.0). Electronically Signed   By: Odessa Fleming M.D.   On: 05/14/2023 16:55   DG Chest 2 View Result Date: 05/14/2023 CLINICAL DATA:  Chest pain and palpitations. EXAM: CHEST - 2 VIEW COMPARISON:  02/01/2022. FINDINGS: Bilateral lung fields are clear. Bilateral costophrenic angles are clear. Normal cardio-mediastinal silhouette. There are surgical staples along the heart border and sternotomy wires, status post CABG (coronary artery bypass graft). Loop recorder device noted overlying the left lower anterior chest wall. No acute osseous abnormalities. The soft tissues are within normal limits. IMPRESSION: No active cardiopulmonary disease. Electronically Signed   By: Jules Schick M.D.   On: 05/14/2023 15:18    PROCEDURES and INTERVENTIONS:  .1-3 Lead EKG Interpretation  Performed by: Delton Prairie, MD Authorized by: Delton Prairie, MD     Interpretation: normal     ECG rate:  80   ECG rate assessment: normal     Rhythm: sinus rhythm     Ectopy: none     Conduction: normal   .Critical Care  Performed by: Delton Prairie, MD Authorized by: Delton Prairie, MD   Critical care provider statement:    Critical care time (minutes):  30   Critical care time was exclusive of:  Separately billable procedures and treating other patients   Critical care was necessary to treat or prevent imminent or life-threatening deterioration of the following conditions:  Cardiac failure and circulatory failure   Critical care was  time spent personally by me on the following activities:  Development of treatment plan with patient or surrogate, discussions with consultants, evaluation of patient's response to treatment, examination of patient, ordering and review of laboratory studies, ordering and review of radiographic studies, ordering and performing treatments and interventions, pulse oximetry, re-evaluation of patient's condition and review of old charts   Medications  heparin bolus via infusion 4,000 Units (4,000 Units Intravenous Bolus from Bag 05/14/23 1743)    Followed by  heparin ADULT infusion 100 units/mL (25000 units/241mL) (1,000 Units/hr Intravenous New Bag/Given 05/14/23 1744)  aspirin chewable tablet 324 mg (324 mg Oral Given 05/14/23 1533)  iohexol (OMNIPAQUE) 350 MG/ML injection 75 mL (75 mLs Intravenous Contrast Given 05/14/23 1607)     IMPRESSION / MDM / ASSESSMENT AND PLAN / ED COURSE  I reviewed the triage vital signs and the nursing notes.  Differential diagnosis includes, but is not limited to, ACS, PTX, PNA, muscle strain/spasm, PE, dissection, anxiety, pleural effusion  {Patient presents with symptoms of an acute illness or injury that is potentially life-threatening.  Vasculopath presents with intermittent neurologic symptoms and about a 15-minute episode of chest discomfort prior to arrival.  Currently neurologically intact and no active chest pain but she is high risk for various vascular pathologies.  No signs of falls, trauma.  EKG without ischemic features but first troponin is mildly elevated, we will provide aspirin and trend this as we obtain CTAs of neck and head to assess for worsening stenosis contributing to her intermittent neurologic symptoms.  Suspect that she will require observation admission.  Troponins continue to trend up.  She has been provided aspirin, we start her on heparin I consult with medicine for admission.  Reassuring CTA neck/head.  Clinical Course as of 05/14/23  1750  Thu May 14, 2023  1526 Most recent carotid duplexes 12/2020 with mild stenosis. [DS]  1749 Reassessed.  No active symptoms.  Discussed reassuring imaging but troponin increasing, concerns for her heart, starting heparin and admission and she is agreeable [DS]    Clinical Course User Index [DS] Delton Prairie, MD     FINAL CLINICAL IMPRESSION(S) / ED DIAGNOSES   Final diagnoses:  NSTEMI (non-ST elevated myocardial infarction) [facility])     Rx / DC Orders   ED Discharge Orders     None        Note:  This document was prepared using Dragon voice recognition software and may include unintentional dictation errors.   Delton Prairie, MD 05/14/23 [phone]

## 2023-05-14 NOTE — ED Triage Notes (Signed)
 Pt reports that she has been feeling unwell.  She has been having palpitations and breaking out in sweats today.  Pt is having central sharp chest pain, no sob, no n/v.  This began yesterday.

## 2023-05-15 ENCOUNTER — Inpatient Hospital Stay
Admit: 2023-05-15 | Discharge: 2023-05-15 | Disposition: A | Discharge status: Home / Self Care | Attending: Internal Medicine | Admitting: Internal Medicine

## 2023-05-15 DIAGNOSIS — I6389 Other cerebral infarction: Secondary | ICD-10-CM

## 2023-05-15 DIAGNOSIS — E785 Hyperlipidemia, unspecified: Secondary | ICD-10-CM

## 2023-05-15 DIAGNOSIS — E119 Type 2 diabetes mellitus without complications: Secondary | ICD-10-CM

## 2023-05-15 DIAGNOSIS — R29704 NIHSS score 4: Secondary | ICD-10-CM | POA: Diagnosis not present

## 2023-05-15 DIAGNOSIS — I739 Peripheral vascular disease, unspecified: Secondary | ICD-10-CM | POA: Diagnosis not present

## 2023-05-15 DIAGNOSIS — Z87891 Personal history of nicotine dependence: Secondary | ICD-10-CM

## 2023-05-15 DIAGNOSIS — I214 Non-ST elevation (NSTEMI) myocardial infarction: Secondary | ICD-10-CM | POA: Diagnosis not present

## 2023-05-15 LAB — BASIC METABOLIC PANEL WITH GFR
Anion gap: 11 (ref 5–15)
BUN: 26 mg/dL — ABNORMAL HIGH (ref 8–23)
CO2: 24 mmol/L (ref 22–32)
Calcium: 9.5 mg/dL (ref 8.9–10.3)
Chloride: 105 mmol/L (ref 98–111)
Creatinine, Ser: 0.83 mg/dL (ref 0.44–1.00)
GFR, Estimated: >60 mL/min (ref >60)
Glucose, Bld: 155 mg/dL — ABNORMAL HIGH (ref 70–99)
Potassium: 3.5 mmol/L (ref 3.5–5.1)
Sodium: 140 mmol/L (ref 135–145)

## 2023-05-15 LAB — CBC
HCT: 38.9 % (ref 36.0–46.0)
Hemoglobin: 12.8 g/dL (ref 12.0–15.0)
MCH: 26.4 pg (ref 26.0–34.0)
MCHC: 32.9 g/dL (ref 30.0–36.0)
MCV: 80.4 fL (ref 80.0–100.0)
Platelets: 357 10*3/uL (ref 150–400)
RBC: 4.84 MIL/uL (ref 3.87–5.11)
RDW: 14 % (ref 11.5–15.5)
WBC: 7.8 10*3/uL (ref 4.0–10.5)
nRBC: 0 % (ref 0.0–0.2)

## 2023-05-15 LAB — LIPID PANEL
Cholesterol: 234 mg/dL — ABNORMAL HIGH (ref 0–200)
HDL: 32 mg/dL — ABNORMAL LOW (ref >40)
LDL Cholesterol: UNDETERMINED mg/dL (ref 0–99)
Total CHOL/HDL Ratio: 7.3 ratio
Triglycerides: 482 mg/dL — ABNORMAL HIGH (ref <150)
VLDL: UNDETERMINED mg/dL (ref 0–40)

## 2023-05-15 LAB — ECHOCARDIOGRAM COMPLETE
AR max vel: 2.72 cm2
AV Area VTI: 3.05 cm2
AV Area mean vel: 2.8 cm2
AV Mean grad: 4 mmHg
AV Peak grad: 8.4 mmHg
Ao pk vel: 1.45 m/s
Area-P 1/2: 2.23 cm2
Calc EF: 60.3 %
Height: 67 in
MV VTI: 2.44 cm2
S' Lateral: 3.1 cm
Single Plane A2C EF: 67.8 %
Single Plane A4C EF: 53.1 %
Weight: 3129.61 [oz_av]

## 2023-05-15 LAB — HIV ANTIBODY (ROUTINE TESTING W REFLEX): HIV Screen 4th Generation wRfx: NONREACTIVE

## 2023-05-15 LAB — HEPARIN LEVEL (UNFRACTIONATED)
Heparin Unfractionated: 0.17 [IU]/mL — ABNORMAL LOW (ref 0.30–0.70)
Heparin Unfractionated: 0.23 [IU]/mL — ABNORMAL LOW (ref 0.30–0.70)
Heparin Unfractionated: 0.25 [IU]/mL — ABNORMAL LOW (ref 0.30–0.70)
Heparin Unfractionated: <0.1 [IU]/mL — ABNORMAL LOW (ref 0.30–0.70)

## 2023-05-15 LAB — TROPONIN I (HIGH SENSITIVITY)
Troponin I (High Sensitivity): 166 ng/L (ref <18)
Troponin I (High Sensitivity): 99 ng/L — ABNORMAL HIGH (ref <18)
Troponin I (High Sensitivity): 87 ng/L — ABNORMAL HIGH (ref <18)

## 2023-05-15 LAB — TSH: TSH: 1.013 u[IU]/mL (ref 0.350–4.500)

## 2023-05-15 LAB — GLUCOSE, CAPILLARY
Glucose-Capillary: 186 mg/dL — ABNORMAL HIGH (ref 70–99)
Glucose-Capillary: 295 mg/dL — ABNORMAL HIGH (ref 70–99)

## 2023-05-15 LAB — CBG MONITORING, ED
Glucose-Capillary: 175 mg/dL — ABNORMAL HIGH (ref 70–99)
Glucose-Capillary: 281 mg/dL — ABNORMAL HIGH (ref 70–99)

## 2023-05-15 LAB — LDL CHOLESTEROL, DIRECT: Direct LDL: 138 mg/dL — ABNORMAL HIGH (ref 0–99)

## 2023-05-15 MED ORDER — STROKE: EARLY STAGES OF RECOVERY BOOK: Freq: Once | Status: AC

## 2023-05-15 MED ORDER — AMLODIPINE BESYLATE 5 MG PO TABS: 10.0000 mg | ORAL_TABLET | Freq: Every day | ORAL | Status: DC

## 2023-05-15 MED ORDER — HEPARIN BOLUS VIA INFUSION
1200.0000 [IU] | Freq: Once | INTRAVENOUS | Status: AC
  Administered 2023-05-15: 1200 [IU] via INTRAVENOUS
  Filled 2023-05-15: qty 1200

## 2023-05-15 MED ORDER — HEPARIN BOLUS VIA INFUSION
2650.0000 [IU] | Freq: Once | INTRAVENOUS | Status: AC
  Administered 2023-05-15: 2650 [IU] via INTRAVENOUS
  Filled 2023-05-15: qty 2650

## 2023-05-15 MED ORDER — FENOFIBRATE 54 MG PO TABS
54.0000 mg | ORAL_TABLET | Freq: Every day | ORAL | Status: DC
  Administered 2023-05-15 – 2023-05-16 (×2): 54 mg via ORAL
  Filled 2023-05-15 (×2): qty 1

## 2023-05-15 MED ORDER — HEPARIN (PORCINE) 25000 UT/250ML-% IV SOLN
1650.0000 [IU]/h | INTRAVENOUS | Status: DC
  Administered 2023-05-15: 1350 [IU]/h via INTRAVENOUS
  Administered 2023-05-15: 1500 [IU]/h via INTRAVENOUS
  Filled 2023-05-15 (×2): qty 250

## 2023-05-15 NOTE — Evaluation (Signed)
 Speech Language Pathology Evaluation Patient Details Name: Morgan Daniels MRN: [id number] DOB: 02-07-1961 Today's Date: 05/15/2023 Time: 4098-1191 SLP Time Calculation (min) (ACUTE ONLY): 25 min  Problem List:  Patient Active Problem List   Diagnosis Date Noted   History of CVA (cerebrovascular accident) 05/14/2023   Type 2 diabetes mellitus with diabetic polyneuropathy, with long-term current use of insulin (HCC) 01/10/2023   Moderate episode of recurrent major depressive disorder (HCC) 12/06/2022   Obesity (BMI 30-39.9) 02/03/2022   AKI (acute kidney injury) (HCC) 02/02/2022   Hyponatremia 02/02/2022   Non healing left heel wound    Stroke (HCC) 09/18/2021   Coronary artery disease 09/18/2021   Dizziness    PAD (peripheral artery disease) (HCC) 02/26/2021   Debility 01/25/2021   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   Encephalopathy acute    CAP (community acquired pneumonia)    Acute respiratory failure with hypoxia (HCC) 01/13/2021   NSTEMI (non-ST elevated myocardial infarction) (HCC) 01/13/2021   Hyperlipidemia associated with type 2 diabetes mellitus (HCC) 01/13/2021   Tobacco abuse 01/13/2021   Cardiogenic shock (HCC) 01/13/2021   Encounter for screening mammogram for malignant neoplasm of breast 01/03/2019   Polyp of sigmoid colon    Irritable bowel syndrome with diarrhea 12/30/2017   Uncontrolled type 2 diabetes mellitus with hyperglycemia (HCC) 09/29/2017   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   Depression, major, single episode, moderate (HCC) 09/29/2017   Encounter for general adult medical examination with abnormal findings 06/11/2017   Type 2 diabetes mellitus with hyperglycemia, with long-term current use of insulin (HCC) 06/11/2017   Vitamin D deficiency 06/11/2017   Mixed hyperlipidemia 06/11/2017   Acquired hypothyroidism 06/11/2017   Hypertension associated with type 2 diabetes mellitus (HCC) 03/02/2017   Impingement syndrome of shoulder region 01/24/2016    Carotid stenosis 08/09/2013   Carotid artery stenosis with cerebral infarction (HCC) 08/03/2013   Aftercare following surgery of the circulatory system, NEC 12/08/2012   Occlusion and stenosis of carotid artery without mention of cerebral infarction 04/28/2012   HYPERCHOLESTEROLEMIA 06/01/2006   PANIC ATTACK 06/01/2006   TOBACCO ABUSE 06/01/2006   ALLERGIC RHINITIS 06/01/2006   GERD 06/01/2006   DIVERTICULAR DISEASE 06/01/2006   MIGRAINES, HX OF 06/01/2006   Past Medical History:  Past Medical History:  Diagnosis Date   Anxiety    Arthritis    Bilateral carotid artery stenosis 2014   Carotid artery occlusion    Chronic kidney disease 06/18/2015   UTI   Chronic kidney disease 2017   Current smoker    CVA (cerebral vascular accident) (HCC) 2013   Depression    Diabetes (HCC)    Diverticulosis    Fatty liver    Fibromyalgia    GERD (gastroesophageal reflux disease)    H/O hiatal hernia    Heart attack (HCC) 01/12/2021   Hiatal hernia    Hypercholesteremia    Hypertension    IBS (irritable bowel syndrome)    PAD (peripheral artery disease) (HCC)    Peptic ulcer    Plantar fasciitis    Stroke (HCC) 01/31/2012   Right side-ministroke   T2DM (type 2 diabetes mellitus) (HCC)    Past Surgical History:  Past Surgical History:  Procedure Laterality Date   ABDOMINAL HYSTERECTOMY  02/18/1988   BACK SURGERY  2000, 2004   CAROTID ENDARTERECTOMY Left 05/06/2012   CARPAL TUNNEL RELEASE Left 07/18/2015   Procedure: CARPAL TUNNEL RELEASE;  Surgeon: Deeann Saint, MD;  Location: [facility] ORS;  Service: Orthopedics;  Laterality: Left;   CEREBRAL  ANGIOGRAM Bilateral 05/03/2012   Procedure: CEREBRAL ANGIOGRAM;  Surgeon: Chuck Hint, MD;  Location: [facility] CATH LAB;  Service: Cardiovascular;  Laterality: Bilateral;   CHOLECYSTECTOMY  02/18/1999   COLONOSCOPY WITH PROPOFOL N/A 11/19/2018   Procedure: COLONOSCOPY WITH PROPOFOL;  Surgeon: Midge Minium, MD;  Location: [facility] SURGERY CNTR;   Service: Endoscopy;  Laterality: N/A;  Diabetic - insulin   CORONARY ARTERY BYPASS GRAFT N/A 01/18/2021   Procedure: CORONARY ARTERY BYPASS GRAFTING (CABG) x 4  USING LEFT INTERNAL MAMMARY ARTERY AND LEFT ENDOSCOPIC GREATER SAPHENOUS VEIN CONDUITS;  Surgeon: Loreli Slot, MD;  Location: [facility] OR;  Service: Open Heart Surgery;  Laterality: N/A;   CORONARY/GRAFT ACUTE MI REVASCULARIZATION N/A 01/13/2021   Procedure: Coronary/Graft Acute MI Revascularization;  Surgeon: Marcina Millard, MD;  Location: [facility] INVASIVE CV LAB;  Service: Cardiovascular;  Laterality: N/A;   ENDARTERECTOMY Left 05/06/2012   Procedure: ENDARTERECTOMY CAROTID;  Surgeon: Chuck Hint, MD;  Location: [facility] OR;  Service: Vascular;  Laterality: Left;   ENDARTERECTOMY Right 08/09/2013   Procedure: ENDARTERECTOMY CAROTID-RIGHT;  Surgeon: Chuck Hint, MD;  Location: [facility] OR;  Service: Vascular;  Laterality: Right;   ENDOVEIN HARVEST OF GREATER SAPHENOUS VEIN Left 01/18/2021   Procedure: ENDOVEIN HARVEST OF GREATER SAPHENOUS VEIN;  Surgeon: Loreli Slot, MD;  Location: [facility] OR;  Service: Open Heart Surgery;  Laterality: Left;   HERNIA REPAIR     IABP INSERTION Right 01/13/2021   Procedure: IABP Insertion;  Surgeon: Marcina Millard, MD;  Location: [facility] INVASIVE CV LAB;  Service: Cardiovascular;  Laterality: Right;   LEFT HEART CATH AND CORONARY ANGIOGRAPHY N/A 01/13/2021   Procedure: LEFT HEART CATH AND CORONARY ANGIOGRAPHY;  Surgeon: Marcina Millard, MD;  Location: [facility] INVASIVE CV LAB;  Service: Cardiovascular;  Laterality: N/A;   LOWER EXTREMITY ANGIOGRAPHY Left 01/03/2021   Procedure: LOWER EXTREMITY ANGIOGRAPHY;  Surgeon: Annice Needy, MD;  Location: [facility] INVASIVE CV LAB;  Service: Cardiovascular;  Laterality: Left;   LOWER EXTREMITY ANGIOGRAPHY Left 02/28/2021   Procedure: LOWER EXTREMITY ANGIOGRAPHY;  Surgeon: Annice Needy, MD;  Location: [facility] INVASIVE CV LAB;  Service: Cardiovascular;   Laterality: Left;   PATCH ANGIOPLASTY Left 05/06/2012   Procedure: WITH DACRON PATCH ANGIOPLASTY ;  Surgeon: Chuck Hint, MD;  Location: [facility] OR;  Service: Vascular;  Laterality: Left;   POLYPECTOMY  11/19/2018   Procedure: POLYPECTOMY;  Surgeon: Midge Minium, MD;  Location: [facility];  Service: Endoscopy;;   SPINE SURGERY  02/17/2002   TEE WITHOUT CARDIOVERSION N/A 01/18/2021   Procedure: TRANSESOPHAGEAL ECHOCARDIOGRAM (TEE);  Surgeon: Loreli Slot, MD;  Location: [facility] OR;  Service: Open Heart Surgery;  Laterality: N/A;   TONSILLECTOMY     TUBAL LIGATION     HPI:  63yo female admitted 05/14/23 with palpitations, gradual onset bilateral vision changes. PMH: anxiety/depression, CKD, DM2, HTN, HLD, chronic pain, RCVA (2013), MI, CAD s/p CABG (2022), COPD, GERD, PAD. MRI:  Acute infarct in the left temporal periventricular white matter  involving the tail of the caudate nucleus. Mod chronic microvascular ischemic changes, mild-mod parenchymal volume loss. Multiple remote infarcts.   Assessment / Plan / Recommendation Clinical Impression  Pt seen at bedside for cognitive linguistic evaluation. Husband and daughter present, and report "brain fog", ease of frustration and lack of energy following MI several years ago. Pt also with ADD per daughter. Family reports "no significant change" in cognition or communication recently. Pt reports high school education. She manages her medications, cooking/cleaning.   Pt receptive and  expressive language appear intact. Speech is fully intelligible. The [employer] Mental Status (SLUMS) Examination was administered today. Pt scored 26/30, indicating mild neurocognitive deficits. Points were lost on animal naming (pt named 9 in 60 seconds, n=15+) and auditory attention and recall (3/4 questions answered accurately).   Based on family report, pt is likely at or near baseline level of function. Pt/family were encouraged to notify PCP  if difficulties arise upon return to normal routinues. Acute ST signing off. Please reconsult if needs arise. RN/MD informed.    SLP Assessment  SLP Recommendation/Assessment: All further Speech Language Pathology needs can be addressed in the next venue of care (if needs arise.)  SLP Visit Diagnosis: Cognitive communication deficit (R41.841)    Recommendations for follow up therapy are one component of a multi-disciplinary discharge planning process, led by the attending physician.  Recommendations may be updated based on patient status, additional functional criteria and insurance authorization.    Follow Up Recommendations  Follow physician's recommendations for discharge plan and follow up therapies    Assistance Recommended at Discharge  PRN  Functional Status Assessment Patient has not had a recent decline in their functional status     SLP Evaluation Cognition  Overall Cognitive Status:  (some difficulties at baseline since MI) Arousal/Alertness: Awake/alert Orientation Level: Oriented X4       Comprehension  Auditory Comprehension Overall Auditory Comprehension: Appears within functional limits for tasks assessed    Expression Expression Primary Mode of Expression: Verbal Verbal Expression Overall Verbal Expression: Appears within functional limits for tasks assessed   Oral / Motor  Oral Motor/Sensory Function Overall Oral Motor/Sensory Function: Within functional limits Motor Speech Overall Motor Speech: Appears within functional limits for tasks assessed Intelligibility: Intelligible           Agron Swiney B. Murvin Natal, [facility], CCC-SLP Speech Language Pathologist  Leigh Aurora 05/15/2023, 2:08 PM

## 2023-05-15 NOTE — TOC CM/SW Note (Signed)
 CSW completed Code 95 with patient and gave her a signed copy.

## 2023-05-15 NOTE — Progress Notes (Signed)
*  PRELIMINARY RESULTS* Echocardiogram 2D Echocardiogram has been performed.  Morgan Daniels 05/15/2023, 3:17 PM

## 2023-05-15 NOTE — Progress Notes (Signed)
 PHARMACY - ANTICOAGULATION CONSULT NOTE  Pharmacy Consult for heparin infusion  Indication: chest pain/ACS  Allergies  Allergen Reactions   Simvastatin Diarrhea and Nausea And Vomiting   Morphine And Codeine Itching   Lactose Intolerance (Gi) Diarrhea    bloating   Latex Rash    Rash when she wears gloves (Negative by test - per pt)    Patient Measurements: Height: 5\' 7"  (170.2 cm) Weight: 88.7 kg (195 lb 9.6 oz) IBW/kg (Calculated) : 61.6 HEPARIN DW (KG): 80.5  Vital Signs: Temp: 98.2 F (36.8 C) (03/28 0749) Temp Source: Oral (03/28 0749) BP: 186/90 (03/28 0749) Pulse Rate: 84 (03/28 0749)  Labs: Recent Labs    05/14/23 1425 05/14/23 1537 05/14/23 1634 05/15/23 0008 05/15/23 0511 05/15/23 0742  HGB 13.2  --   --   --  12.8  --   HCT 39.9  --   --   --  38.9  --   PLT 379  --   --   --  357  --   APTT  --  29  --   --   --   --   LABPROT  --  14.0  --   --   --   --   INR  --  1.1  --   --   --   --   HEPARINUNFRC  --   --   --  <0.10*  --  0.23*  CREATININE 1.12*  --   --   --  0.83  --   TROPONINIHS 54*  --  84*  --  166*  --     Estimated Creatinine Clearance: 80.3 mL/min (by C-G formula based on SCr of 0.83 mg/dL).   Medical History: Past Medical History:  Diagnosis Date   Anxiety    Arthritis    Bilateral carotid artery stenosis 2014   Carotid artery occlusion    Chronic kidney disease 06/18/2015   UTI   Chronic kidney disease 2017   Current smoker    CVA (cerebral vascular accident) (HCC) 2013   Depression    Diabetes (HCC)    Diverticulosis    Fatty liver    Fibromyalgia    GERD (gastroesophageal reflux disease)    H/O hiatal hernia    Heart attack (HCC) 01/12/2021   Hiatal hernia    Hypercholesteremia    Hypertension    IBS (irritable bowel syndrome)    PAD (peripheral artery disease) (HCC)    Peptic ulcer    Plantar fasciitis    Stroke (HCC) 01/31/2012   Right side-ministroke   T2DM (type 2 diabetes mellitus) (HCC)      Assessment: 63 yo female with admitted with sharp chest pain and palpitations. Patient has PMH of CVA, PAD, CAD s/p CABG in 2022. Pharmacy consulted for heparin infusion dosing and monitoring for NSTEMI.   Troponin HS 54>> 84  Date Time HL Rate/Comment 3/28 0008 <0.1 SUBtherapeutic 3/28 0742 0.23 SUBtherapeutic  Goal of Therapy:  Heparin level 0.3-0.7 units/ml Monitor platelets by anticoagulation protocol: Yes   Plan:  Heparin level remains subtherapeutic, no issues with infusion noted Give heparin bolus of 1200 units x1 Increase heparin infusion rate to 1500 units/hour Check heparin level 6 hours after rate change Monitor daily heparin levels while on heparin infusion Monitor CBC and signs/symptoms of bleeding  Thank you for involving pharmacy in this patient's care.   Rockwell Alexandria, PharmD Clinical Pharmacist 05/15/2023 8:14 AM

## 2023-05-15 NOTE — Progress Notes (Signed)
 PHARMACY - ANTICOAGULATION CONSULT NOTE  Pharmacy Consult for heparin infusion  Indication: chest pain/ACS/stroke   Allergies  Allergen Reactions   Simvastatin Diarrhea and Nausea And Vomiting   Morphine And Codeine Itching   Lactose Intolerance (Gi) Diarrhea    bloating   Latex Rash    Rash when she wears gloves (Negative by test - per pt)    Patient Measurements: Height: 5\' 7"  (170.2 cm) Weight: 88.7 kg (195 lb 9.6 oz) IBW/kg (Calculated) : 61.6 HEPARIN DW (KG): 80.5  Vital Signs: Temp: 98.8 F (37.1 C) (03/28 1535) Temp Source: Oral (03/28 1142) BP: 175/77 (03/28 1535) Pulse Rate: 81 (03/28 1535)  Labs: Recent Labs    05/14/23 1425 05/14/23 1537 05/14/23 1634 05/15/23 0008 05/15/23 0511 05/15/23 0742 05/15/23 1603  HGB 13.2  --   --   --  12.8  --   --   HCT 39.9  --   --   --  38.9  --   --   PLT 379  --   --   --  357  --   --   APTT  --  29  --   --   --   --   --   LABPROT  --  14.0  --   --   --   --   --   INR  --  1.1  --   --   --   --   --   HEPARINUNFRC  --   --   --  <0.10*  --  0.23* 0.17*  CREATININE 1.12*  --   --   --  0.83  --   --   TROPONINIHS 54*  --  84*  --  166*  --   --     Estimated Creatinine Clearance: 80.3 mL/min (by C-G formula based on SCr of 0.83 mg/dL).   Medical History: Past Medical History:  Diagnosis Date   Anxiety    Arthritis    Bilateral carotid artery stenosis 2014   Carotid artery occlusion    Chronic kidney disease 06/18/2015   UTI   Chronic kidney disease 2017   Current smoker    CVA (cerebral vascular accident) (HCC) 2013   Depression    Diabetes (HCC)    Diverticulosis    Fatty liver    Fibromyalgia    GERD (gastroesophageal reflux disease)    H/O hiatal hernia    Heart attack (HCC) 01/12/2021   Hiatal hernia    Hypercholesteremia    Hypertension    IBS (irritable bowel syndrome)    PAD (peripheral artery disease) (HCC)    Peptic ulcer    Plantar fasciitis    Stroke (HCC) 01/31/2012   Right  side-ministroke   T2DM (type 2 diabetes mellitus) (HCC)     Assessment: 63 yo female with admitted with sharp chest pain and palpitations. Patient has PMH of CVA, PAD, CAD s/p CABG in 2022. Pharmacy consulted for heparin infusion dosing and monitoring for NSTEMI.  Neurology agrees with heparin gtt; benefit in NSTEMI felt to outweigh the risk of hemorrhagic conversion of this small stroke.   Troponin HS 54>> 84  Date Time HL Rate/Comment 3/28 0008 <0.1 SUBtherapeutic 3/28 0742 0.23 SUBtherapeutic 3/28 1603 0.17 SUBtherapeutic  Goal of Therapy:  Heparin level 0.3-0.5  pt w/ stroke and ACS  Monitor platelets by anticoagulation protocol: Yes   Plan:  Heparin level remains subtherapeutic Give heparin bolus of 1200 units x1 Increase heparin infusion rate  to 1600 units/hour Check heparin level 6 hours after rate change Monitor daily heparin levels while on heparin infusion Monitor CBC and signs/symptoms of bleeding  Thank you for involving pharmacy in this patient's care.   Bari Mantis PharmD Clinical Pharmacist 05/15/2023

## 2023-05-15 NOTE — Consult Note (Addendum)
 [facility] CLINIC CARDIOLOGY CONSULT NOTE       Patient ID: Morgan Daniels MRN: [id number] DOB/AGE: 11-09-1960 63 y.o.  Admit date: 05/14/2023 Referring Physician Dr. Huel Cote Primary Physician Sallyanne Kuster, NP  Primary Cardiologist Dr. Darrold Junker Reason for Consultation chest pain, elevated troponin  HPI: Morgan Daniels is a 63 y.o. female  with a past medical history of coronary artery disease s/p CABG x4 2022, multiple CVAs 09/2021 s/p ILR implant, hypertension, hyperlipidemia, type II diabetes, COPD (prior smoker), peripheral vascular disease s/p SFA stent 2023 who presented to the ED on 05/14/2023 for visual changes, chest pain and palpitations. Troponins uptrending. Cardiology was consulted for further evaluation.   Patient reports that two days ago she began having visual changes in both eyes. She states that the changes persisted and yesterday she had an episode of palpitations with associated chest pain. The episode was brief and she has had no recurrence but given her symptoms she decided to come to the ED for evaluation. Workup in the ED notable for Cr 1.12, K 3.8, Mg 2.1, Hgb 13.2, WBC 87.2. BNP normal at 50. Troponins trended 54 > 84 > 166. EKG nonischemic. She was started on IV heparin in the ED. MRI brain with acute infarct and prior infarcts.   At the time of my evaluation this AM she is resting comfortably in ED stretcher. We discussed her symptoms in further detail.  She denies any other recent episodes of chest pain.  States that the episode yesterday was very brief only lasting a few minutes and was associated with the palpitation she had.  States that her visual changes started 2 days ago and were relatively persistent.  She has a prior history of strokes and had a loop recorder implanted a few years ago.  Otherwise states that she had been doing well recently with no problems.  Review of systems complete and found to be negative unless listed above    Past Medical History:   Diagnosis Date   Anxiety    Arthritis    Bilateral carotid artery stenosis 2014   Carotid artery occlusion    Chronic kidney disease 06/18/2015   UTI   Chronic kidney disease 2017   Current smoker    CVA (cerebral vascular accident) (HCC) 2013   Depression    Diabetes (HCC)    Diverticulosis    Fatty liver    Fibromyalgia    GERD (gastroesophageal reflux disease)    H/O hiatal hernia    Heart attack (HCC) 01/12/2021   Hiatal hernia    Hypercholesteremia    Hypertension    IBS (irritable bowel syndrome)    PAD (peripheral artery disease) (HCC)    Peptic ulcer    Plantar fasciitis    Stroke (HCC) 01/31/2012   Right side-ministroke   T2DM (type 2 diabetes mellitus) (HCC)     Past Surgical History:  Procedure Laterality Date   ABDOMINAL HYSTERECTOMY  02/18/1988   BACK SURGERY  2000, 2004   CAROTID ENDARTERECTOMY Left 05/06/2012   CARPAL TUNNEL RELEASE Left 07/18/2015   Procedure: CARPAL TUNNEL RELEASE;  Surgeon: Deeann Saint, MD;  Location: [facility] ORS;  Service: Orthopedics;  Laterality: Left;   CEREBRAL ANGIOGRAM Bilateral 05/03/2012   Procedure: CEREBRAL ANGIOGRAM;  Surgeon: Chuck Hint, MD;  Location: [facility] CATH LAB;  Service: Cardiovascular;  Laterality: Bilateral;   CHOLECYSTECTOMY  02/18/1999   COLONOSCOPY WITH PROPOFOL N/A 11/19/2018   Procedure: COLONOSCOPY WITH PROPOFOL;  Surgeon: Midge Minium, MD;  Location: [facility] SURGERY CNTR;  Service: Endoscopy;  Laterality: N/A;  Diabetic - insulin   CORONARY ARTERY BYPASS GRAFT N/A 01/18/2021   Procedure: CORONARY ARTERY BYPASS GRAFTING (CABG) x 4  USING LEFT INTERNAL MAMMARY ARTERY AND LEFT ENDOSCOPIC GREATER SAPHENOUS VEIN CONDUITS;  Surgeon: Loreli Slot, MD;  Location: [facility] OR;  Service: Open Heart Surgery;  Laterality: N/A;   CORONARY/GRAFT ACUTE MI REVASCULARIZATION N/A 01/13/2021   Procedure: Coronary/Graft Acute MI Revascularization;  Surgeon: Marcina Millard, MD;  Location: [facility] INVASIVE CV LAB;   Service: Cardiovascular;  Laterality: N/A;   ENDARTERECTOMY Left 05/06/2012   Procedure: ENDARTERECTOMY CAROTID;  Surgeon: Chuck Hint, MD;  Location: [facility] OR;  Service: Vascular;  Laterality: Left;   ENDARTERECTOMY Right 08/09/2013   Procedure: ENDARTERECTOMY CAROTID-RIGHT;  Surgeon: Chuck Hint, MD;  Location: [facility] OR;  Service: Vascular;  Laterality: Right;   ENDOVEIN HARVEST OF GREATER SAPHENOUS VEIN Left 01/18/2021   Procedure: ENDOVEIN HARVEST OF GREATER SAPHENOUS VEIN;  Surgeon: Loreli Slot, MD;  Location: [facility] OR;  Service: Open Heart Surgery;  Laterality: Left;   HERNIA REPAIR     IABP INSERTION Right 01/13/2021   Procedure: IABP Insertion;  Surgeon: Marcina Millard, MD;  Location: [facility] INVASIVE CV LAB;  Service: Cardiovascular;  Laterality: Right;   LEFT HEART CATH AND CORONARY ANGIOGRAPHY N/A 01/13/2021   Procedure: LEFT HEART CATH AND CORONARY ANGIOGRAPHY;  Surgeon: Marcina Millard, MD;  Location: [facility] INVASIVE CV LAB;  Service: Cardiovascular;  Laterality: N/A;   LOWER EXTREMITY ANGIOGRAPHY Left 01/03/2021   Procedure: LOWER EXTREMITY ANGIOGRAPHY;  Surgeon: Annice Needy, MD;  Location: [facility] INVASIVE CV LAB;  Service: Cardiovascular;  Laterality: Left;   LOWER EXTREMITY ANGIOGRAPHY Left 02/28/2021   Procedure: LOWER EXTREMITY ANGIOGRAPHY;  Surgeon: Annice Needy, MD;  Location: [facility] INVASIVE CV LAB;  Service: Cardiovascular;  Laterality: Left;   PATCH ANGIOPLASTY Left 05/06/2012   Procedure: WITH DACRON PATCH ANGIOPLASTY ;  Surgeon: Chuck Hint, MD;  Location: [facility] OR;  Service: Vascular;  Laterality: Left;   POLYPECTOMY  11/19/2018   Procedure: POLYPECTOMY;  Surgeon: Midge Minium, MD;  Location: [facility] SURGERY CNTR;  Service: Endoscopy;;   SPINE SURGERY  02/17/2002   TEE WITHOUT CARDIOVERSION N/A 01/18/2021   Procedure: TRANSESOPHAGEAL ECHOCARDIOGRAM (TEE);  Surgeon: Loreli Slot, MD;  Location: [facility] OR;  Service: Open Heart Surgery;   Laterality: N/A;   TONSILLECTOMY     TUBAL LIGATION      (Not in a hospital admission)  Social History   Socioeconomic History   Marital status: Married    Spouse name: Not on file   Number of children: Not on file   Years of education: Not on file   Highest education level: Not on file  Occupational History   Not on file  Tobacco Use   Smoking status: Former    Current packs/day: 1.00    Average packs/day: 1 pack/day for 52.5 years (52.5 ttl pk-yrs)    Types: Cigarettes    Start date: 11/09/1970    Passive exposure: Current   Smokeless tobacco: Never   Tobacco comments:       Vaping Use   Vaping status: Former  Substance and Sexual Activity   Alcohol use: Not Currently    Comment: rarely   Drug use: No   Sexual activity: Not Currently    Partners: Male  Other Topics Concern   Not on file  Social History Narrative   ** Merged History Encounter **       Social Drivers of  Health   Financial Resource Strain: Not on file  Food Insecurity: Not on file  Transportation Needs: Not on file  Physical Activity: Not on file  Stress: Not on file  Social Connections: Not on file  Intimate Partner Violence: Not on file    Family History  Problem Relation Age of Onset   Hypertension Mother    Hyperlipidemia Mother    Deep vein thrombosis Mother    Cancer Father    Alcohol abuse Father    Heart failure Father    Hypertension Maternal Grandmother    Deep vein thrombosis Sister    Alcohol abuse Sister    Alcohol abuse Sister      Vitals:   05/15/23 0030 05/15/23 0510 05/15/23 0602 05/15/23 0749  BP: (!) 178/96 (!) 163/84  (!) 186/90  Pulse: 86 69  84  Resp: (!) 23 15  19   Temp:   98.1 F (36.7 C) 98.2 F (36.8 C)  TempSrc:   Oral Oral  SpO2: 92% 95%  97%  Weight:      Height:        PHYSICAL EXAM General: Chronically ill appearing female, well nourished, in no acute distress. HEENT: Normocephalic and atraumatic. Neck: No JVD.  Lungs: Normal respiratory  effort on room air. Clear bilaterally to auscultation. No wheezes, crackles, rhonchi.  Heart: HRRR. Normal S1 and S2 without gallops or murmurs.  Abdomen: Non-distended appearing.  Msk: Normal strength and tone for age. Extremities: Warm and well perfused. No clubbing, cyanosis. No edema.  Neuro: Alert and oriented X 3. Psych: Answers questions appropriately.   Labs: Basic Metabolic Panel: Recent Labs    05/14/23 1425 05/15/23 0511  NA 139 140  K 3.8 3.5  CL 104 105  CO2 24 24  GLUCOSE 210* 155*  BUN 27* 26*  CREATININE 1.12* 0.83  CALCIUM 9.7 9.5  MG 2.1  --    Liver Function Tests: No results for input(s): "AST", "ALT", "ALKPHOS", "BILITOT", "PROT", "ALBUMIN" in the last 72 hours. No results for input(s): "LIPASE", "AMYLASE" in the last 72 hours. CBC: Recent Labs    05/14/23 1425 05/15/23 0511  WBC 8.2 7.8  HGB 13.2 12.8  HCT 39.9 38.9  MCV 79.6* 80.4  PLT 379 357   Cardiac Enzymes: Recent Labs    05/14/23 1425 05/14/23 1634 05/15/23 0511  TROPONINIHS 54* 84* 166*   BNP: Recent Labs    05/14/23 1425  BNP 50.6   D-Dimer: No results for input(s): "DDIMER" in the last 72 hours. Hemoglobin A1C: No results for input(s): "HGBA1C" in the last 72 hours. Fasting Lipid Panel: Recent Labs    05/15/23 0511  CHOL 234*  HDL 32*  LDLCALC UNABLE TO CALCULATE IF TRIGLYCERIDE OVER 400 mg/dL  TRIG 811*  CHOLHDL 7.3   Thyroid Function Tests: No results for input(s): "TSH", "T4TOTAL", "T3FREE", "THYROIDAB" in the last 72 hours.  Invalid input(s): "FREET3" Anemia Panel: No results for input(s): "VITAMINB12", "FOLATE", "FERRITIN", "TIBC", "IRON", "RETICCTPCT" in the last 72 hours.   Radiology: MR BRAIN WO CONTRAST Result Date: 05/14/2023 CLINICAL DATA:  Neuro deficit, concern for stroke. Palpitations, diaphoresis, chest pain, feeling unwell. EXAM: MRI HEAD WITHOUT CONTRAST TECHNIQUE: Multiplanar, multiecho pulse sequences of the brain and surrounding structures  were obtained without intravenous contrast. COMPARISON:  CTA head and neck earlier same day. FINDINGS: Brain: There is a 12 x 6 mm focus of restricted diffusion in the left temporal periventricular white matter likely involving the tail of the caudate nucleus and partially  extending into the temporal stem white matter. No evidence of intracranial hemorrhage. Scattered and confluent FLAIR signal abnormality in the periventricular and subcortical white matter. Remote lacunar infarcts in the bilateral basal ganglia and in the right corona radiata and bilateral centrum semiovale. No mass lesion or midline shift. Cerebellum is unremarkable. Normal appearance of midline structures. The basilar cisterns are patent. No extra-axial fluid collections. Ventricles: Prominence of the lateral ventricles suggestive of underlying parenchymal volume loss. Vascular: Skull base flow voids are visualized. Skull and upper cervical spine: No focal abnormality. Sinuses/Orbits: Orbits are symmetric. Paranasal sinuses are clear. Other: Mastoid air cells are clear. IMPRESSION: Acute infarct in the left temporal periventricular white matter involving the tail of the caudate nucleus. Moderate chronic microvascular ischemic changes and mild to moderate parenchymal volume loss. Multiple remote infarcts as above. Electronically Signed   By: Emily Filbert M.D.   On: 05/14/2023 23:59   CT ANGIO HEAD NECK W WO CM Result Date: 05/14/2023 CLINICAL DATA:  63 year old female with chest pain and palpitations, feeling unwell. Vision changes, feels like she is leaning to the left. EXAM: CT ANGIOGRAPHY HEAD AND NECK WITH AND WITHOUT CONTRAST TECHNIQUE: Multidetector CT imaging of the head and neck was performed using the standard protocol during bolus administration of intravenous contrast. Multiplanar CT image reconstructions and MIPs were obtained to evaluate the vascular anatomy. Carotid stenosis measurements (when applicable) are obtained utilizing  NASCET criteria, using the distal internal carotid diameter as the denominator. RADIATION DOSE REDUCTION: This exam was performed according to the departmental dose-optimization program which includes automated exposure control, adjustment of the mA and/or kV according to patient size and/or use of iterative reconstruction technique. CONTRAST:  75mL OMNIPAQUE IOHEXOL 350 MG/ML SOLN COMPARISON:  Brain MRI 09/18/2021. Head CT 02/01/2022. CTA head and neck 09/18/2021. FINDINGS: CT HEAD Brain: Chronic small vessel disease. Patchy and confluent bilateral cerebral white matter hypodensity with chronic lacunar infarcts in the bilateral corona radiata, deep gray nuclei. Stable non contrast CT appearance of the brain. Since 2023. No midline shift, ventriculomegaly, mass effect, evidence of mass lesion, intracranial hemorrhage or evidence of cortically based acute infarction. Calvarium and skull base: Stable, intact. Paranasal sinuses: Visualized paranasal sinuses and mastoids are stable and well aerated. Orbits: Visualized orbits and scalp soft tissues are within normal limits. CTA NECK Skeleton: Chronic sternotomy. Mild for age cervical spine degeneration. No acute osseous abnormality identified. Upper chest: CABG.  Otherwise negative. Other neck: Neck soft tissue spaces are stable since 2023 and within normal limits. Aortic arch: Extensive Calcified aortic atherosclerosis. Three vessel arch. Right carotid system: Brachiocephalic artery and right CCA plaque without stenosis. Capacious right carotid bifurcation and regional surgical clips suggesting chronic endarterectomy. Residual soft and calcified plaque along the posterior and lateral bifurcation is stable without stenosis. Mostly calcified plaque beginning distal to the right ICA bulb and continuing to the skull base is stable without stenosis. Left carotid system: Extensive left CCA origin plaque appears stable without stenosis. Left CCA plaque continues to the left  carotid bifurcation. Chronic low-density plaque along the medial vessel just proximal to the bifurcation on series 6, image 115. More calcified plaque at the bifurcation. But no associated stenosis. Combined soft and calcified plaque along the medial and posterior vessel just below the skull base is stable and without stenosis (series 6, image 78). Vertebral arteries: Right subclavian origin plaque without stenosis. Right vertebral artery remains patent. With intermittent atherosclerosis to the skull base. Only mild stenosis results. Proximal left subclavian atherosclerosis without stenosis.  Left vertebral artery origin is patent. Calcified plaque beginning in the left V1 segment. Left vertebral artery appears mildly non dominant but stable since 2023. Mild associated stenosis along its course to the skull base including in the V3 segment from soft and calcified plaque. CTA HEAD Posterior circulation: Distal vertebral arteries and vertebrobasilar junction, PICA origins remain patent with mild irregularity. Patent basilar artery. Mild basilar artery irregularity and mild mid basilar stenosis (series 15, image 21). Distal basilar, basilar tip, SCA and PCA origins remain patent. Left posterior communicating artery contributes to the PCA on that side, diminutive or absent right posterior communicating. Right PCA branches are patent with mild irregularity. On the left there is moderate short segment irregularity and stenosis of the P2 which is progressed since 2023 on series 11, image 23. Anterior circulation: Both ICA siphons are patent. On the left there is moderate to severe calcified plaque resulting in moderate distal cavernous stenosis. Left posterior communicating artery origin is normal. Right side similar calcified plaque, moderate distal cavernous and proximal supraclinoid stenosis. These appear stable since 2023. Patent carotid termini, MCA and ACA origins. Anterior communicating artery with median artery of  the corpus callosum and bilateral ACA branches are within normal limits. MCA M1 segments and MCA trifurcations are patent without stenosis. Bilateral MCA branches are mildly irregular. Venous sinuses: Early contrast timing, not well evaluated. Anatomic variants: Mildly dominant right vertebral artery, fetal type left PCA origin. Review of the MIP images confirms the above findings IMPRESSION: 1. 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 Negative for large vessel occlusion. Suspected previous right carotid endarterectomy. 2. Stable atherosclerosis since the 2023 CTA except for Moderate stenosis left PCA P2 segment is new/increased. No severe stenosis is identified. Up to moderate stenosis also in both ICA siphons. 3.  Stable CT appearance of chronic cerebral small vessel disease. 4. CABG.  Aortic Atherosclerosis (ICD10-I70.0). Electronically Signed   By: Odessa Fleming M.D.   On: 05/14/2023 16:55   DG Chest 2 View Result Date: 05/14/2023 CLINICAL DATA:  Chest pain and palpitations. EXAM: CHEST - 2 VIEW COMPARISON:  02/01/2022. FINDINGS: Bilateral lung fields are clear. Bilateral costophrenic angles are clear. Normal cardio-mediastinal silhouette. There are surgical staples along the heart border and sternotomy wires, status post CABG (coronary artery bypass graft). Loop recorder device noted overlying the left lower anterior chest wall. No acute osseous abnormalities. The soft tissues are within normal limits. IMPRESSION: No active cardiopulmonary disease. Electronically Signed   By: Jules Schick M.D.   On: 05/14/2023 15:18    ECHO ordered  TELEMETRY reviewed by me 05/15/2023: sinus rhythm rate 80s  EKG reviewed by me: sinus rhythm rate 93 bpm, no acute ischemic changes  Data reviewed by me 05/15/2023: last 24h vitals tele labs imaging I/O ED provider note, admission H&P  Principal Problem:   NSTEMI (non-ST elevated myocardial infarction) (HCC) Active Problems:   Hypertension associated with  type 2 diabetes mellitus (HCC)   Type 2 diabetes mellitus with hyperglycemia, with long-term current use of insulin (HCC)   Inflammatory polyarthritis (HCC)   PAD (peripheral artery disease) (HCC)   AKI (acute kidney injury) (HCC)   History of CVA (cerebrovascular accident)    ASSESSMENT AND PLAN:  Morgan Daniels is a 63 y.o. female  with a past medical history of coronary artery disease s/p CABG x4 2022, multiple CVAs 09/2021 s/p ILR implant, hypertension, hyperlipidemia, type II diabetes, COPD (prior smoker), peripheral vascular disease s/p SFA stent 2023 who presented to the ED  on 05/14/2023 for visual changes, chest pain and palpitations. Troponins uptrending. Cardiology was consulted for further evaluation.   # Acute CVA # Prior strokes # NSTEMI, likely type II # Coronary artery disease s/p CABG # Palpitations Patient with hx of CAD and prior strokes now presenting with visual changes and chest pain/palpitations. Troponins 54 > 84 > 166. EKG without acute ischemic changes. MRI brain with acute CVA in L temporal periventricular white matter. Started on IV heparin in the ED. Interrogation of ILR without any evidence of atrial fibrillation or other arrhythmias.  -Echo ordered. Further stroke workup per neurology. -Continue aspirin 81 mg daily, rosuvastatin 40 mg daily, fenofibrate 54 mg daily. Previously prescribed plavix but reportedly was not taking this.  -Suspect troponin elevation type II in setting of acute stroke. One brief episode of chest pain yesterday, no recurrence. Can continue heparin x48 hours if not contraindicated. If echo comes back normal can consider discontinuing sooner. -No plan for further cardiac workup at this time.   This patient's plan of care was discussed and created with Dr. Corky Sing and he is in agreement.  Signed: Gale Journey, PA-C  05/15/2023, 10:05 AM [facility] Cardiology

## 2023-05-15 NOTE — Consult Note (Addendum)
 NEUROLOGY CONSULT NOTE   Date of service: May 15, 2023 Patient Name: Morgan Daniels MRN:  [id number] DOB:  [birth year] Chief Complaint: acute ischemic stroke Requesting Provider: Gillis Santa, MD  History of Present Illness   This is a 63 year old woman with past medical history significant for uncontrolled type 2 diabetes, hypertension, hyperlipidemia, chronic pain on opioids, prior CVA, CAD status post CABG in 2022, COPD, PAD status post left SF a stent who presented to the ED yesterday with visual complaints and chest pain.  Starting yesterday she had gradual onset of vision changes bilaterally that she describes as blurry and spotty.  The symptoms are intermittent and overall have improved since yesterday.  She did not seek immediate medical attention however she began to have palpitations and then substernal chest pain and came to the ED at that time.  MRI brain without showed an acute infarct in the left temporal periventricular white matter involving the tail of the caudate nucleus.  Multiple remote infarcts are also seen.  CTA head and neck showed no large vessel occlusion, suspected previous right carotid endarterectomy, stable atherosclerosis since most recent CTA in 2023 except for moderate stenosis of the left PCA P2 segment is new/increased.  No severe intracranial stenosis was identified.  She is up to moderate stenosis also in both ICA siphons.  She has an implanted loop recorder which was interrogated by cardiology today and showed no evidence of A-fib.  TTE is pending.  LDL was unable to be calculated due to triglycerides over 400.  A1c last month was 10.8.  Due to her chest pain, troponins were checked which increased from 54 > 84 > 166 .  EKG did not have acute ischemic changes.  Cardiology felt that she had an NSTEMI secondary to her CVA and started her on heparin drip for 48 hours.  NIHSS components Score: Comment  1a Level of Conscious 0[]  1[]  2[]  3[]      1b LOC Questions 0[]   1[]  2[]       1c LOC Commands 0[]  1[]  2[]       2 Best Gaze 0[]  1[]  2[]       3 Visual 0[]  1[]  2[]  3[]      4 Facial Palsy 0[]  1[x]  2[]  3[]      5a Motor Arm - left 0[]  1[]  2[]  3[]  4[]  UN[]    5b Motor Arm - Right 0[]  1[]  2[]  3[]  4[]  UN[]    6a Motor Leg - Left 0[]  1[x]  2[]  3[]  4[]  UN[]    6b Motor Leg - Right 0[]  1[x]  2[]  3[]  4[]  UN[]    7 Limb Ataxia 0[]  1[]  2[]  3[]  UN[]     8 Sensory 0[]  1[]  2[]  UN[]      9 Best Language 0[]  1[]  2[]  3[]      10 Dysarthria 0[]  1[x]  2[]  UN[]      11 Extinct. and Inattention 0[]  1[]  2[]       TOTAL:  4   Any unchecked boxes represent a score of 0   ROS  Comprehensive ROS performed and pertinent positives documented in HPI   Past History   Past Medical History:  Diagnosis Date   Anxiety    Arthritis    Bilateral carotid artery stenosis 2014   Carotid artery occlusion    Chronic kidney disease 06/18/2015   UTI   Chronic kidney disease 2017   Current smoker    CVA (cerebral vascular accident) (HCC) 2013   Depression    Diabetes (HCC)    Diverticulosis    Fatty liver  Fibromyalgia    GERD (gastroesophageal reflux disease)    H/O hiatal hernia    Heart attack (HCC) 01/12/2021   Hiatal hernia    Hypercholesteremia    Hypertension    IBS (irritable bowel syndrome)    PAD (peripheral artery disease) (HCC)    Peptic ulcer    Plantar fasciitis    Stroke (HCC) 01/31/2012   Right side-ministroke   T2DM (type 2 diabetes mellitus) [facility])     Past Surgical History:  Procedure Laterality Date   ABDOMINAL HYSTERECTOMY  02/18/1988   BACK SURGERY  2000, 2004   CAROTID ENDARTERECTOMY Left 05/06/2012   CARPAL TUNNEL RELEASE Left 07/18/2015   Procedure: CARPAL TUNNEL RELEASE;  Surgeon: Deeann Saint, MD;  Location: [facility] ORS;  Service: Orthopedics;  Laterality: Left;   CEREBRAL ANGIOGRAM Bilateral 05/03/2012   Procedure: CEREBRAL ANGIOGRAM;  Surgeon: Chuck Hint, MD;  Location: [facility] CATH LAB;  Service: Cardiovascular;  Laterality: Bilateral;    CHOLECYSTECTOMY  02/18/1999   COLONOSCOPY WITH PROPOFOL N/A 11/19/2018   Procedure: COLONOSCOPY WITH PROPOFOL;  Surgeon: Midge Minium, MD;  Location: [facility];  Service: Endoscopy;  Laterality: N/A;  Diabetic - insulin   CORONARY ARTERY BYPASS GRAFT N/A 01/18/2021   Procedure: CORONARY ARTERY BYPASS GRAFTING (CABG) x 4  USING LEFT INTERNAL MAMMARY ARTERY AND LEFT ENDOSCOPIC GREATER SAPHENOUS VEIN CONDUITS;  Surgeon: Loreli Slot, MD;  Location: [facility] OR;  Service: Open Heart Surgery;  Laterality: N/A;   CORONARY/GRAFT ACUTE MI REVASCULARIZATION N/A 01/13/2021   Procedure: Coronary/Graft Acute MI Revascularization;  Surgeon: Marcina Millard, MD;  Location: [facility] INVASIVE CV LAB;  Service: Cardiovascular;  Laterality: N/A;   ENDARTERECTOMY Left 05/06/2012   Procedure: ENDARTERECTOMY CAROTID;  Surgeon: Chuck Hint, MD;  Location: [facility] OR;  Service: Vascular;  Laterality: Left;   ENDARTERECTOMY Right 08/09/2013   Procedure: ENDARTERECTOMY CAROTID-RIGHT;  Surgeon: Chuck Hint, MD;  Location: [facility] OR;  Service: Vascular;  Laterality: Right;   ENDOVEIN HARVEST OF GREATER SAPHENOUS VEIN Left 01/18/2021   Procedure: ENDOVEIN HARVEST OF GREATER SAPHENOUS VEIN;  Surgeon: Loreli Slot, MD;  Location: [facility] OR;  Service: Open Heart Surgery;  Laterality: Left;   HERNIA REPAIR     IABP INSERTION Right 01/13/2021   Procedure: IABP Insertion;  Surgeon: Marcina Millard, MD;  Location: [facility] INVASIVE CV LAB;  Service: Cardiovascular;  Laterality: Right;   LEFT HEART CATH AND CORONARY ANGIOGRAPHY N/A 01/13/2021   Procedure: LEFT HEART CATH AND CORONARY ANGIOGRAPHY;  Surgeon: Marcina Millard, MD;  Location: [facility] INVASIVE CV LAB;  Service: Cardiovascular;  Laterality: N/A;   LOWER EXTREMITY ANGIOGRAPHY Left 01/03/2021   Procedure: LOWER EXTREMITY ANGIOGRAPHY;  Surgeon: Annice Needy, MD;  Location: [facility] INVASIVE CV LAB;  Service: Cardiovascular;  Laterality: Left;    LOWER EXTREMITY ANGIOGRAPHY Left 02/28/2021   Procedure: LOWER EXTREMITY ANGIOGRAPHY;  Surgeon: Annice Needy, MD;  Location: [facility] INVASIVE CV LAB;  Service: Cardiovascular;  Laterality: Left;   PATCH ANGIOPLASTY Left 05/06/2012   Procedure: WITH DACRON PATCH ANGIOPLASTY ;  Surgeon: Chuck Hint, MD;  Location: [facility] OR;  Service: Vascular;  Laterality: Left;   POLYPECTOMY  11/19/2018   Procedure: POLYPECTOMY;  Surgeon: Midge Minium, MD;  Location: [facility];  Service: Endoscopy;;   SPINE SURGERY  02/17/2002   TEE WITHOUT CARDIOVERSION N/A 01/18/2021   Procedure: TRANSESOPHAGEAL ECHOCARDIOGRAM (TEE);  Surgeon: Loreli Slot, MD;  Location: [facility] OR;  Service: Open Heart Surgery;  Laterality: N/A;   TONSILLECTOMY  TUBAL LIGATION      Family History: Family History  Problem Relation Age of Onset   Hypertension Mother    Hyperlipidemia Mother    Deep vein thrombosis Mother    Cancer Father    Alcohol abuse Father    Heart failure Father    Hypertension Maternal Grandmother    Deep vein thrombosis Sister    Alcohol abuse Sister    Alcohol abuse Sister     Social History  reports that she has quit smoking. Her smoking use included cigarettes. She started smoking about 52 years ago. She has a 52.5 pack-year smoking history. She has been exposed to tobacco smoke. She has never used smokeless tobacco. She reports that she does not currently use alcohol. She reports that she does not use drugs.  Allergies  Allergen Reactions   Simvastatin Diarrhea and Nausea And Vomiting   Morphine And Codeine Itching   Lactose Intolerance (Gi) Diarrhea    bloating   Latex Rash    Rash when she wears gloves (Negative by test - per pt)    Medications   Current Facility-Administered Medications:    [START ON 05/16/2023]  stroke: early stages of recovery book, , Does not apply, Once, Gillis Santa, MD   acetaminophen (TYLENOL) tablet 650 mg, 650 mg, Oral, Q4H PRN, Verdene Lennert, MD   aspirin EC tablet 81 mg, 81 mg, Oral, Daily, Verdene Lennert, MD, 81 mg at 05/15/23 0904   escitalopram (LEXAPRO) tablet 10 mg, 10 mg, Oral, Daily, Verdene Lennert, MD, 10 mg at 05/15/23 8119   fenofibrate tablet 54 mg, 54 mg, Oral, Daily, Gillis Santa, MD, 54 mg at 05/15/23 1152   gabapentin (NEURONTIN) capsule 600 mg, 600 mg, Oral, TID, Verdene Lennert, MD, 600 mg at 05/15/23 0904   heparin ADULT infusion 100 units/mL (25000 units/25mL), 1,500 Units/hr, Intravenous, Continuous, Rockwell Alexandria, RPH, Last Rate: 15 mL/hr at 05/15/23 1152, 1,500 Units/hr at 05/15/23 1152   insulin aspart (novoLOG) injection 0-15 Units, 0-15 Units, Subcutaneous, TID WC, Verdene Lennert, MD, 8 Units at 05/15/23 1152   insulin glargine-yfgn (SEMGLEE) injection 10 Units, 10 Units, Subcutaneous, QHS, Verdene Lennert, MD, 10 Units at 05/14/23 2235   mirtazapine (REMERON) tablet 15 mg, 15 mg, Oral, QHS, Verdene Lennert, MD, 15 mg at 05/14/23 2235   nitroGLYCERIN (NITROSTAT) SL tablet 0.4 mg, 0.4 mg, Sublingual, Q5 Min x 3 PRN, Verdene Lennert, MD   ondansetron (ZOFRAN) injection 4 mg, 4 mg, Intravenous, Q6H PRN, Verdene Lennert, MD   rosuvastatin (CRESTOR) tablet 40 mg, 40 mg, Oral, Daily, Verdene Lennert, MD, 40 mg at 05/15/23 0905   traMADol (ULTRAM) tablet 50 mg, 50 mg, Oral, Q8H PRN, Verdene Lennert, MD, 50 mg at 05/15/23 0910  Current Outpatient Medications:    amLODipine (NORVASC) 10 MG tablet, Take 1 tablet (10 mg total) by mouth daily., Disp: 90 tablet, Rfl: 1   aspirin 81 MG chewable tablet, Chew 1 tablet (81 mg total) by mouth daily., Disp: , Rfl:    dicyclomine (BENTYL) 10 MG capsule, TAKE 1 CAPSULE BY MOUTH THREE TIMES DAILY BEFORE MEALS, Disp: 270 capsule, Rfl: 1   empagliflozin (JARDIANCE) 25 MG TABS tablet, Take 1 tablet (25 mg total) by mouth daily before breakfast., Disp: 30 tablet, Rfl: 3   escitalopram (LEXAPRO) 10 MG tablet, Take 10 mg by mouth daily., Disp: , Rfl:    gabapentin  (NEURONTIN) 800 MG tablet, Take 1 tablet by mouth 3 (three) times daily., Disp: , Rfl:    gemfibrozil (LOPID)  600 MG tablet, Take 1 tablet (600 mg total) by mouth 2 (two) times daily., Disp: 180 tablet, Rfl: 1   glipiZIDE (GLUCOTROL XL) 5 MG 24 hr tablet, Take 1 tablet (5 mg total) by mouth daily with breakfast., Disp: 90 tablet, Rfl: 1   insulin degludec (TRESIBA FLEXTOUCH) 200 UNIT/ML FlexTouch Pen, Inject 40 Units into the skin at bedtime. Increase by 4 units every 4 days if AM sugar >200., Disp: , Rfl:    losartan (COZAAR) 25 MG tablet, Take 1 tablet (25 mg total) by mouth daily., Disp: 90 tablet, Rfl: 3   mirtazapine (REMERON) 15 MG tablet, TAKE ONE TABLET BY MOUTH AT BEDTIME (Patient taking differently: Take 7.5 mg by mouth at bedtime.), Disp: 90 tablet, Rfl: 1   rosuvastatin (CRESTOR) 40 MG tablet, TAKE 1 TABLET BY MOUTH EVERYDAY, Disp: 90 tablet, Rfl: 3   traMADol (ULTRAM) 50 MG tablet, TAKE ONE TABLET BY MOUTH THREE TIMES DAILY AS NEEDED FOR MODERATE OR SEVERE PAIN, Disp: 90 tablet, Rfl: 2   Advocate Safety Lancets 26G MISC, Use 1 safety lancet to check glucose 4 times daily  for diabetes E11.65, Disp: 100 each, Rfl: 12   Alirocumab (PRALUENT) 150 MG/ML SOAJ, Inject 1 mL (150 mg total) into the skin every 30 (thirty) days. (Patient not taking: Reported on 05/14/2023), Disp: 1 mL, Rfl: 3   Cholecalciferol (VITAMIN D-3) 125 MCG (5000 UT) TABS, Take 5,000 Units by mouth daily. (Patient not taking: Reported on 05/14/2023), Disp: 30 tablet, Rfl: 0   clopidogrel (PLAVIX) 75 MG tablet, Take 75 mg by mouth daily. (Patient not taking: Reported on 05/14/2023), Disp: , Rfl:    gabapentin (NEURONTIN) 600 MG tablet, TAKE ONE TABLET BY MOUTH 3 TIMES DAILY (Patient not taking: Reported on 05/14/2023), Disp: 270 tablet, Rfl: 3   glucose blood (ACCU-CHEK GUIDE) test strip, Use 1 test strip to check glucose before meals, at bedtime and prn with glucose meter, Disp: 100 each, Rfl: 12   insulin aspart (FIASP) 100  UNIT/ML FlexTouch Pen, Inject insulin into skin 3 times daily with meals per sliding scale provided to patient. Max dose: 63 units per 24 hours (Patient not taking: Reported on 05/14/2023), Disp: 15 mL, Rfl: 3   metoprolol succinate (TOPROL-XL) 50 MG 24 hr tablet, TAKE 1 TABLET BY MOUTH EVERY DAY (Patient not taking: Reported on 05/14/2023), Disp: 90 tablet, Rfl: 3   nitroGLYCERIN (NITRODUR - DOSED IN MG/24 HR) 0.2 mg/hr patch, Place 1 patch (0.2 mg total) onto the skin daily., Disp: 30 patch, Rfl: 11   tiZANidine (ZANAFLEX) 4 MG tablet, TAKE 1 TABLET BY MOUTH TWICE DAILY AS NEEDED FOR MUSCLE PAIN AND SPASMS (Patient not taking: Reported on 05/14/2023), Disp: 180 tablet, Rfl: 1   ULTICARE MICRO PEN NEEDLES 32G X 4 MM MISC, TO USE WITH INSULIN DOSING THREE TIMES DAILY PRIOR TO MEALS AND AT BEDTIME FOR BASAL INSULIN., Disp: , Rfl:    VASCEPA 1 g capsule, TAKE 1 CAPSULE BY MOUTH 2 TIMES DAILY (Patient not taking: Reported on 05/14/2023), Disp: 120 capsule, Rfl: 1  Vitals   Vitals:   05/15/23 0510 05/15/23 0602 05/15/23 0749 05/15/23 1142  BP: (!) 163/84  (!) 186/90 (!) 184/85  Pulse: 69  84 77  Resp: 15  19 18   Temp:  98.1 F (36.7 C) 98.2 F (36.8 C) 98.4 F (36.9 C)  TempSrc:  Oral Oral Oral  SpO2: 95%  97% 98%  Weight:      Height:  Body mass index is 30.64 kg/m.  Physical Exam   Gen: patient lying in bed, NAD CV: extremities appear well-perfused Resp: normal WOB  Neurologic Examination   MS: alert, oriented x4, follows commands Speech: mild dysarthria, no aphasia CN: PERRL, VFF, EOMI, sensation intact, L NLF flattening, hearing intact to voice Motor: No drift BUE, drift but not to bed BLE Sensory: SILT Reflexes: 2+ symm with toes down bilat Coordination: FNF intact bilat Gait: deferred  Labs/Imaging/Neurodiagnostic studies   CBC:  Recent Labs  Lab 05/19/2023 1425 05/15/23 0511  WBC 8.2 7.8  HGB 13.2 12.8  HCT 39.9 38.9  MCV 79.6* 80.4  PLT 379 357   Basic  Metabolic Panel:  Lab Results  Component Value Date   NA 140 05/15/2023   K 3.5 05/15/2023   CO2 24 05/15/2023   GLUCOSE 155 (H) 05/15/2023   BUN 26 (H) 05/15/2023   CREATININE 0.83 05/15/2023   CALCIUM 9.5 05/15/2023   GFRNONAA >60 05/15/2023   GFRAA >60 02/03/2019   Lipid Panel:  Lab Results  Component Value Date   LDLCALC UNABLE TO CALCULATE IF TRIGLYCERIDE OVER 400 mg/dL 16/11/9602   [id number]:  Lab Results  Component Value Date   HGBA1C 10.8 (A) 04/14/2023   Urine Drug Screen:     Component Value Date/Time   LABOPIA NONE DETECTED 09/18/2021 1731   COCAINSCRNUR NONE DETECTED 09/18/2021 1731   LABBENZ NONE DETECTED 09/18/2021 1731   AMPHETMU NONE DETECTED 09/18/2021 1731   THCU NONE DETECTED 09/18/2021 1731   LABBARB NONE DETECTED 09/18/2021 1731    Alcohol Level No results found for: "ETH" INR  Lab Results  Component Value Date   INR 1.1 05-19-2023   APTT  Lab Results  Component Value Date   APTT 29 05/19/23   AED levels: No results found for: "PHENYTOIN", "ZONISAMIDE", "LAMOTRIGINE", "LEVETIRACETA"   CT angio Head and Neck with contrast(Personally reviewed): 1. 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 Negative for large vessel occlusion. Suspected previous right carotid endarterectomy. 2. Stable atherosclerosis since the 2023 CTA except for Moderate stenosis left PCA P2 segment is new/increased. No severe stenosis is identified. Up to moderate stenosis also in both ICA siphons. 3.  Stable CT appearance of chronic cerebral small vessel disease. 4. CABG.  Aortic Atherosclerosis (ICD10-I70.0).  MRI Brain(Personally reviewed): Acute infarct in the left temporal periventricular white matter involving the tail of the caudate nucleus.   Moderate chronic microvascular ischemic changes and mild to moderate parenchymal volume loss. Multiple remote infarcts in the bilateral basal ganglia and in the right corona radiata and bilateral centrum  semiovale.  There is confluent T2/FLAIR signal abnormality in the periventricular and subcortical white matter.  ILR recording reviewed by cards today, no e/o a fib   TTE pending  ASSESSMENT   This is a 63 year old woman with past medical history significant for uncontrolled type 2 diabetes, hypertension, hyperlipidemia, chronic pain on opioids, prior CVA, CAD status post CABG in 2022, COPD, PAD status post left SF a stent who presented to the ED yesterday with visual complaints and chest pain.  She was found to have an acute infarct on MRI in the left temporal periventricular white matter involving the tail of the caudate nucleus.  She also has extensive confluent chronic small vessel ischemic disease as well as several remote ischemic infarcts.  Etiology of the current stroke is favored to be small vessel disease in the presence of multiple risk factors including uncontrolled diabetes and hyperlipidemia. NSTEMI favored to be 2/2  CVA. Neurology agrees with heparin gtt; benefit in NSTEMI felt to outweigh the risk of hemorrhagic conversion of this small stroke. Of note patient has ILR which showed no e/o a fib.  RECOMMENDATIONS   - Per discussion with cardiology will allow relative permissive HTN up to 180/110 x24 hrs (sx onset was yesterday); lower goal 2/2 concurrent NSTEMI - OK to continue heparin gtt x48 hrs for NSTEMI (CVA is small therefore low risk of hemorrhagic conversion) - Per cards continue ASA 81mg  daily while on heparin - After heparin discontinued, recommend ASA 81mg  daily + plavix 75mg  daily x21 days f/b plavix 75mg  daily monotherapy after that (stroke represents aspirin failure) - F/u TTE - Continue rosuvastatin 40mg  daily and fenofibrate 54mg  daily - Counseling on importance of glycemic control to reduce risk of recurrent stroke - Recommend close f/u with her established outpatient neurologist Dr. Malvin Johns - Will continue to  follow ______________________________________________________________________    Signed, Jefferson Fuel, MD [facility]

## 2023-05-15 NOTE — Progress Notes (Signed)
 PHARMACY - ANTICOAGULATION CONSULT NOTE  Pharmacy Consult for heparin infusion  Indication: chest pain/ACS  Allergies  Allergen Reactions   Simvastatin Diarrhea and Nausea And Vomiting   Morphine And Codeine Itching   Lactose Intolerance (Gi) Diarrhea    bloating   Latex Rash    Rash when she wears gloves (Negative by test - per pt)    Patient Measurements: Height: 5\' 7"  (170.2 cm) Weight: 88.7 kg (195 lb 9.6 oz) IBW/kg (Calculated) : 61.6 HEPARIN DW (KG): 80.5  Vital Signs: Temp: 98.2 F (36.8 C) (03/27 2210) Temp Source: Oral (03/27 2210) BP: 152/87 (03/27 2210) Pulse Rate: 76 (03/27 2210)  Labs: Recent Labs    05/14/23 1425 05/14/23 1537 05/14/23 1634 05/15/23 0008  HGB 13.2  --   --   --   HCT 39.9  --   --   --   PLT 379  --   --   --   APTT  --  29  --   --   LABPROT  --  14.0  --   --   INR  --  1.1  --   --   HEPARINUNFRC  --   --   --  <0.10*  CREATININE 1.12*  --   --   --   TROPONINIHS 54*  --  84*  --     Estimated Creatinine Clearance: 59.5 mL/min (A) (by C-G formula based on SCr of 1.12 mg/dL (H)).   Medical History: Past Medical History:  Diagnosis Date   Anxiety    Arthritis    Bilateral carotid artery stenosis 2014   Carotid artery occlusion    Chronic kidney disease 06/18/2015   UTI   Chronic kidney disease 2017   Current smoker    CVA (cerebral vascular accident) (HCC) 2013   Depression    Diabetes (HCC)    Diverticulosis    Fatty liver    Fibromyalgia    GERD (gastroesophageal reflux disease)    H/O hiatal hernia    Heart attack (HCC) 01/12/2021   Hiatal hernia    Hypercholesteremia    Hypertension    IBS (irritable bowel syndrome)    PAD (peripheral artery disease) (HCC)    Peptic ulcer    Plantar fasciitis    Stroke (HCC) 01/31/2012   Right side-ministroke   T2DM (type 2 diabetes mellitus) (HCC)     Assessment: 63 yo female with admitted with sharp chest pain and palpitations. Patient has PMH of CVA, PAD, CAD s/p  CABG in 2022. Pharmacy consulted for heparin infusion dosing and monitoring for NSTEMI.   Troponin HS 54>> 84  Goal of Therapy:  Heparin level 0.3-0.7 units/ml Monitor platelets by anticoagulation protocol: Yes   Plan:  3/28:  HL @ 0008 = < 0.1, SUBtherapeutic  - Will order heparin 2650 units IV X 1 bolus and increase drip rate to 1350 units/hr - Will recheck HL 6 hrs after rate change  Continue to monitor H&H and platelets  Yamile Roedl D, PharmD Clinical Pharmacist 05/15/2023 1:19 AM

## 2023-05-15 NOTE — Progress Notes (Addendum)
 [facility] Progress Note  Patient: Morgan Daniels    ZOX:[id number]  DOA: 05/14/2023     Date of Service: the patient was seen and examined on 05/15/2023  Chief Complaint  Patient presents with   Palpitations   Brief hospital course: Morgan Daniels is a 63 y.o. female with medical history significant of uncontrolled type 2 diabetes, hypertension, hyperlipidemia, chronic pain on opioids, CVA, CAD s/p CABG (2022), COPD, PAD s/p left SFA stent, who presents to the ED due to palpitations.   Morgan Daniels states that starting yesterday, she had gradual onset vision changes bilaterally that she describes as blurry spotty vision.  The symptoms are intermittent but have continued throughout the day as well.  She took 2 aspirin last night as she was worried she was having another stroke and then went to bed.  Today, she began to experience sudden onset substernal chest pain without radiation that was accompanied by palpitations and diaphoresis.  She is unsure for how long the episode lasted, but stated it was enough to concern her.  At this time, her chest pain resolved but she continues to have intermittent palpitations.  She is compliant with her aspirin and Plavix without any recent missed doses.  She denies any shortness of breath or lower extremity swelling.   ED course: On arrival to the ED, patient was hypertensive at 158/88 with heart rate of 82.  She was saturating at 97% on room air.  She was afebrile at 98.2.  Initial workup notable for unremarkable CBC, glucose 210, BUN 27, creatinine 1.12, GFR 56.  Troponin 54 than 84.  Chest x-ray with no active disease.  CTA of the head/neck with no acute LVO.  Patient started on heparin infusion and aspirin.  TR[facility] contacted for admission.    Assessment and Plan:   # NSTEMI (non-ST elevated myocardial infarction) Presented with chest pain which has been resolved, had palpitations off and on. Most likely due to acute CVA, neurogenic cardiac  injury Continue to monitor on telemetry Continue aspirin, statin and nitroglycerin as needed Continue heparin IV infusion for 48 hours as per cardiology Follow cardio for further recommendation Follow TTE   # Acute CVA Pt has h/o prior CVA  Patient reports nonspecific vision changes that are not quite consistent with TIA or CVA Continue home aspirin and statin MRI brain: Acute infarct in the left temporal periventricular white matter involving the tail of the caudate nucleus. Moderate chronic microvascular ischemic changes and mild to moderate parenchymal volume loss. Multiple remote infarcts. CTA negative for LVO Follow PT and OT eval SLP eval done, mild cognitive dysfunction, need to follow with PCP Neurology consulted, allow permissive hypertension for 2 to 3 days, keep BP 180/110 due to NSTEMI Continue neurocheck as per protocol Continue fall precautions Triglyceride 482, elevated, could not count LDL Hemoglobin A1c 10.8, elevated Check TSH level And neurology recommended, - After heparin discontinued, recommend ASA 81mg  daily + plavix 75mg  daily x21 days f/b plavix 75mg  daily monotherapy after that (stroke represents aspirin failure) - Continue rosuvastatin 40mg  daily and fenofibrate 54mg  daily Recommend close f/u with her established outpatient neurologist Dr. Malvin Johns    AKI (acute kidney injury) [facility]) Unclear etiology as patient does not report poor p.o. intake.  Per chart review, it seems she has frequent fluctuations of her creatinine up to the 1.2--0.83 improved, s/p 500 cc bolus Monitor renal functions daily    Type 2 diabetes mellitus with hyperglycemia, with long-term current use of insulin (HCC) Long-term  history of uncontrolled type 2 diabetes with last A1c of 10.8% approximately 1 month ago. - Hold home regimen - SSI, moderate - Semglee 20 units at bedtime   PAD (peripheral artery disease) (HCC) - Continue home regimen   Hypertension associated with type 2  diabetes mellitus (HCC) - Allow permissive hypertension due to acute CVA Held home amlodipine for now Held home losartan given elevated creatinine on admission Monitor BP and titrate medications accordingly   Hypertriglyceridemia Patient was on gemfibrozil but triglyceride level is still elevated so discontinued gemfibrozil and started on fenofibrate 54 mg p.o. daily, dose can be titrated as an outpatient, repeat LFTs after 3 months Continue Crestor 40 mg p.o. daily  Inflammatory polyarthritis (HCC) - Continue home tramadol.    Body mass index is 30.64 kg/m.  Interventions:  Diet: Carb modified/heart healthy diet DVT Prophylaxis: Therapeutic Anticoagulation with heparin IV infusion    Advance goals of care discussion: Full code  Family Communication: family was not present at bedside, at the time of interview.  The pt provided permission to discuss medical plan with the family. Opportunity was given to ask question and all questions were answered satisfactorily.   Disposition:  Pt is from Home, admitted with Acute CVA and NSTEMi, still on IV hep gtt, which precludes a safe discharge. Discharge to Home, when cleared by cardiology and neurology.  Subjective: No significant events overnight, chest pain resolved, no palpitations at this time.  Vision is improving.  Denied any active issues, no complaints.  Resting comfortably.  Physical Exam: General: NAD, lying comfortably Appear in no distress, affect appropriate Eyes: PERRLA ENT: Oral Mucosa Clear, moist  Neck: no JVD,  Cardiovascular: S1 and S2 Present, no Murmur,  Respiratory: good respiratory effort, Bilateral Air entry equal and Decreased, no Crackles, no wheezes Abdomen: Bowel Sound present, Soft and no tenderness,  Skin: no rashes Extremities: no Pedal edema, no calf tenderness Neurologic: without any new focal findings Gait not checked due to patient safety concerns  Vitals:   05/15/23 0510 05/15/23 0602 05/15/23  0749 05/15/23 1142  BP: (!) 163/84  (!) 186/90 (!) 184/85  Pulse: 69  84 77  Resp: 15  19 18   Temp:  98.1 F (36.7 C) 98.2 F (36.8 C) 98.4 F (36.9 C)  TempSrc:  Oral Oral Oral  SpO2: 95%  97% 98%  Weight:      Height:       No intake or output data in the 24 hours ending 05/15/23 1449 Filed Weights   05/14/23 1730 05/14/23 1909  Weight: 88.7 kg 88.7 kg    Data Reviewed: I have personally reviewed and interpreted daily labs, tele strips, imagings as discussed above. I reviewed all nursing notes, pharmacy notes, vitals, pertinent old records I have discussed plan of care as described above with RN and patient/family.  CBC: Recent Labs  Lab 05/14/23 1425 05/15/23 0511  WBC 8.2 7.8  HGB 13.2 12.8  HCT 39.9 38.9  MCV 79.6* 80.4  PLT 379 357   Basic Metabolic Panel: Recent Labs  Lab 05/14/23 1425 05/15/23 0511  NA 139 140  K 3.8 3.5  CL 104 105  CO2 24 24  GLUCOSE 210* 155*  BUN 27* 26*  CREATININE 1.12* 0.83  CALCIUM 9.7 9.5  MG 2.1  --     Studies: MR BRAIN WO CONTRAST Result Date: 05/14/2023 CLINICAL DATA:  Neuro deficit, concern for stroke. Palpitations, diaphoresis, chest pain, feeling unwell. EXAM: MRI HEAD WITHOUT CONTRAST TECHNIQUE: Multiplanar, multiecho pulse  sequences of the brain and surrounding structures were obtained without intravenous contrast. COMPARISON:  CTA head and neck earlier same day. FINDINGS: Brain: There is a 12 x 6 mm focus of restricted diffusion in the left temporal periventricular white matter likely involving the tail of the caudate nucleus and partially extending into the temporal stem white matter. No evidence of intracranial hemorrhage. Scattered and confluent FLAIR signal abnormality in the periventricular and subcortical white matter. Remote lacunar infarcts in the bilateral basal ganglia and in the right corona radiata and bilateral centrum semiovale. No mass lesion or midline shift. Cerebellum is unremarkable. Normal appearance  of midline structures. The basilar cisterns are patent. No extra-axial fluid collections. Ventricles: Prominence of the lateral ventricles suggestive of underlying parenchymal volume loss. Vascular: Skull base flow voids are visualized. Skull and upper cervical spine: No focal abnormality. Sinuses/Orbits: Orbits are symmetric. Paranasal sinuses are clear. Other: Mastoid air cells are clear. IMPRESSION: Acute infarct in the left temporal periventricular white matter involving the tail of the caudate nucleus. Moderate chronic microvascular ischemic changes and mild to moderate parenchymal volume loss. Multiple remote infarcts as above. Electronically Signed   By: Emily Filbert M.D.   On: 05/14/2023 23:59   CT ANGIO HEAD NECK W WO CM Result Date: 05/14/2023 CLINICAL DATA:  63 year old female with chest pain and palpitations, feeling unwell. Vision changes, feels like she is leaning to the left. EXAM: CT ANGIOGRAPHY HEAD AND NECK WITH AND WITHOUT CONTRAST TECHNIQUE: Multidetector CT imaging of the head and neck was performed using the standard protocol during bolus administration of intravenous contrast. Multiplanar CT image reconstructions and MIPs were obtained to evaluate the vascular anatomy. Carotid stenosis measurements (when applicable) are obtained utilizing NASCET criteria, using the distal internal carotid diameter as the denominator. RADIATION DOSE REDUCTION: This exam was performed according to the departmental dose-optimization program which includes automated exposure control, adjustment of the mA and/or kV according to patient size and/or use of iterative reconstruction technique. CONTRAST:  75mL OMNIPAQUE IOHEXOL 350 MG/ML SOLN COMPARISON:  Brain MRI 09/18/2021. Head CT 02/01/2022. CTA head and neck 09/18/2021. FINDINGS: CT HEAD Brain: Chronic small vessel disease. Patchy and confluent bilateral cerebral white matter hypodensity with chronic lacunar infarcts in the bilateral corona radiata, deep gray  nuclei. Stable non contrast CT appearance of the brain. Since 2023. No midline shift, ventriculomegaly, mass effect, evidence of mass lesion, intracranial hemorrhage or evidence of cortically based acute infarction. Calvarium and skull base: Stable, intact. Paranasal sinuses: Visualized paranasal sinuses and mastoids are stable and well aerated. Orbits: Visualized orbits and scalp soft tissues are within normal limits. CTA NECK Skeleton: Chronic sternotomy. Mild for age cervical spine degeneration. No acute osseous abnormality identified. Upper chest: CABG.  Otherwise negative. Other neck: Neck soft tissue spaces are stable since 2023 and within normal limits. Aortic arch: Extensive Calcified aortic atherosclerosis. Three vessel arch. Right carotid system: Brachiocephalic artery and right CCA plaque without stenosis. Capacious right carotid bifurcation and regional surgical clips suggesting chronic endarterectomy. Residual soft and calcified plaque along the posterior and lateral bifurcation is stable without stenosis. Mostly calcified plaque beginning distal to the right ICA bulb and continuing to the skull base is stable without stenosis. Left carotid system: Extensive left CCA origin plaque appears stable without stenosis. Left CCA plaque continues to the left carotid bifurcation. Chronic low-density plaque along the medial vessel just proximal to the bifurcation on series 6, image 115. More calcified plaque at the bifurcation. But no associated stenosis. Combined soft and calcified  plaque along the medial and posterior vessel just below the skull base is stable and without stenosis (series 6, image 78). Vertebral arteries: Right subclavian origin plaque without stenosis. Right vertebral artery remains patent. With intermittent atherosclerosis to the skull base. Only mild stenosis results. Proximal left subclavian atherosclerosis without stenosis. Left vertebral artery origin is patent. Calcified plaque beginning  in the left V1 segment. Left vertebral artery appears mildly non dominant but stable since 2023. Mild associated stenosis along its course to the skull base including in the V3 segment from soft and calcified plaque. CTA HEAD Posterior circulation: Distal vertebral arteries and vertebrobasilar junction, PICA origins remain patent with mild irregularity. Patent basilar artery. Mild basilar artery irregularity and mild mid basilar stenosis (series 15, image 21). Distal basilar, basilar tip, SCA and PCA origins remain patent. Left posterior communicating artery contributes to the PCA on that side, diminutive or absent right posterior communicating. Right PCA branches are patent with mild irregularity. On the left there is moderate short segment irregularity and stenosis of the P2 which is progressed since 2023 on series 11, image 23. Anterior circulation: Both ICA siphons are patent. On the left there is moderate to severe calcified plaque resulting in moderate distal cavernous stenosis. Left posterior communicating artery origin is normal. Right side similar calcified plaque, moderate distal cavernous and proximal supraclinoid stenosis. These appear stable since 2023. Patent carotid termini, MCA and ACA origins. Anterior communicating artery with median artery of the corpus callosum and bilateral ACA branches are within normal limits. MCA M1 segments and MCA trifurcations are patent without stenosis. Bilateral MCA branches are mildly irregular. Venous sinuses: Early contrast timing, not well evaluated. Anatomic variants: Mildly dominant right vertebral artery, fetal type left PCA origin. Review of the MIP images confirms the above findings IMPRESSION: 1. 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 Negative for large vessel occlusion. Suspected previous right carotid endarterectomy. 2. Stable atherosclerosis since the 2023 CTA except for Moderate stenosis left PCA P2 segment is new/increased. No severe  stenosis is identified. Up to moderate stenosis also in both ICA siphons. 3.  Stable CT appearance of chronic cerebral small vessel disease. 4. CABG.  Aortic Atherosclerosis (ICD10-I70.0). Electronically Signed   By: Odessa Fleming M.D.   On: 05/14/2023 16:55   DG Chest 2 View Result Date: 05/14/2023 CLINICAL DATA:  Chest pain and palpitations. EXAM: CHEST - 2 VIEW COMPARISON:  02/01/2022. FINDINGS: Bilateral lung fields are clear. Bilateral costophrenic angles are clear. Normal cardio-mediastinal silhouette. There are surgical staples along the heart border and sternotomy wires, status post CABG (coronary artery bypass graft). Loop recorder device noted overlying the left lower anterior chest wall. No acute osseous abnormalities. The soft tissues are within normal limits. IMPRESSION: No active cardiopulmonary disease. Electronically Signed   By: Jules Schick M.D.   On: 05/14/2023 15:18    Scheduled Meds:  [START ON 05/16/2023]  stroke: early stages of recovery book   Does not apply Once   aspirin EC  81 mg Oral Daily   escitalopram  10 mg Oral Daily   fenofibrate  54 mg Oral Daily   gabapentin  600 mg Oral TID   insulin aspart  0-15 Units Subcutaneous TID WC   insulin glargine-yfgn  10 Units Subcutaneous QHS   mirtazapine  15 mg Oral QHS   rosuvastatin  40 mg Oral Daily   Continuous Infusions:  heparin 1,500 Units/hr (05/15/23 1152)   PRN Meds: acetaminophen, nitroGLYCERIN, ondansetron (ZOFRAN) IV, traMADol  Time spent: 55 minutes  Author: Gillis Santa. MD [facility] 05/15/2023 2:49 PM  To reach On-call, see care teams to locate the attending and reach out to them via [email or web address]. If 7PM-7AM, please contact night-coverage If you still have difficulty reaching the attending provider, please page the [facility] (Director on Call) for [facility] on amion for assistance.

## 2023-05-16 DIAGNOSIS — M064 Inflammatory polyarthropathy: Secondary | ICD-10-CM | POA: Diagnosis not present

## 2023-05-16 DIAGNOSIS — E1159 Type 2 diabetes mellitus with other circulatory complications: Secondary | ICD-10-CM

## 2023-05-16 DIAGNOSIS — Z8673 Personal history of transient ischemic attack (TIA), and cerebral infarction without residual deficits: Secondary | ICD-10-CM | POA: Diagnosis not present

## 2023-05-16 DIAGNOSIS — I214 Non-ST elevation (NSTEMI) myocardial infarction: Secondary | ICD-10-CM | POA: Diagnosis not present

## 2023-05-16 DIAGNOSIS — I152 Hypertension secondary to endocrine disorders: Secondary | ICD-10-CM

## 2023-05-16 DIAGNOSIS — N179 Acute kidney failure, unspecified: Secondary | ICD-10-CM | POA: Diagnosis not present

## 2023-05-16 DIAGNOSIS — I639 Cerebral infarction, unspecified: Secondary | ICD-10-CM

## 2023-05-16 LAB — CBC
HCT: 38.7 % (ref 36.0–46.0)
Hemoglobin: 12.8 g/dL (ref 12.0–15.0)
MCH: 26 pg (ref 26.0–34.0)
MCHC: 33.1 g/dL (ref 30.0–36.0)
MCV: 78.7 fL — ABNORMAL LOW (ref 80.0–100.0)
Platelets: 357 10*3/uL (ref 150–400)
RBC: 4.92 MIL/uL (ref 3.87–5.11)
RDW: 13.9 % (ref 11.5–15.5)
WBC: 7.1 10*3/uL (ref 4.0–10.5)
nRBC: 0 % (ref 0.0–0.2)

## 2023-05-16 LAB — HEPARIN LEVEL (UNFRACTIONATED): Heparin Unfractionated: 0.4 [IU]/mL (ref 0.30–0.70)

## 2023-05-16 LAB — BASIC METABOLIC PANEL WITH GFR
Anion gap: 10 (ref 5–15)
BUN: 24 mg/dL — ABNORMAL HIGH (ref 8–23)
CO2: 24 mmol/L (ref 22–32)
Calcium: 9.3 mg/dL (ref 8.9–10.3)
Chloride: 103 mmol/L (ref 98–111)
Creatinine, Ser: 0.96 mg/dL (ref 0.44–1.00)
GFR, Estimated: >60 mL/min (ref >60)
Glucose, Bld: 182 mg/dL — ABNORMAL HIGH (ref 70–99)
Potassium: 3.4 mmol/L — ABNORMAL LOW (ref 3.5–5.1)
Sodium: 137 mmol/L (ref 135–145)

## 2023-05-16 LAB — PHOSPHORUS: Phosphorus: 4.8 mg/dL — ABNORMAL HIGH (ref 2.5–4.6)

## 2023-05-16 LAB — GLUCOSE, CAPILLARY
Glucose-Capillary: 185 mg/dL — ABNORMAL HIGH (ref 70–99)
Glucose-Capillary: 251 mg/dL — ABNORMAL HIGH (ref 70–99)

## 2023-05-16 LAB — MAGNESIUM: Magnesium: 2.2 mg/dL (ref 1.7–2.4)

## 2023-05-16 MED ORDER — ISOSORBIDE MONONITRATE ER 30 MG PO TB24
30.0000 mg | ORAL_TABLET | Freq: Every day | ORAL | Status: DC
  Administered 2023-05-16: 30 mg via ORAL
  Filled 2023-05-16: qty 1

## 2023-05-16 MED ORDER — CLOPIDOGREL BISULFATE 75 MG PO TABS: 75.0000 mg | ORAL_TABLET | Freq: Every day | ORAL | 3 refills | Status: DC

## 2023-05-16 MED ORDER — ASPIRIN 81 MG PO CHEW: 81.0000 mg | CHEWABLE_TABLET | Freq: Every day | ORAL | 0 refills | Status: AC

## 2023-05-16 MED ORDER — FENOFIBRATE 54 MG PO TABS: 54.0000 mg | ORAL_TABLET | Freq: Every day | ORAL | 1 refills | Status: DC

## 2023-05-16 MED ORDER — ISOSORBIDE MONONITRATE ER 30 MG PO TB24: 30.0000 mg | ORAL_TABLET | Freq: Every day | ORAL | 1 refills | Status: DC

## 2023-05-16 NOTE — Plan of Care (Signed)
 Neurology plan of care  Please see neurology consult note from yesterday for full findings and recommendations. TTE showed no intracardiac clot or other significant abnormalities. No change to my recommendations in consult note from yesterday. She may f/u with her established outpatient neurologist Dr. Malvin Johns after discharge.  Neurology sign off but feel free to reengage if additional neurologic concerns arise.  Bing Neighbors, MD [facility] [phone]  If 7pm- 7am, please page neurology on call as listed in AMION.

## 2023-05-16 NOTE — TOC Initial Note (Signed)
 Transition of Care [facility]) - Initial/Assessment Note    Patient Details  Name: Morgan Daniels MRN: [id number] Date of Birth: August 20, 1960  Transition of Care [facility]) CM/SW Contact:    Bing Quarry, RN Phone Number: 05/16/2023, 1:11 PM  Clinical Narrative:                  Transition of Care [facility]) - Inpatient Brief Assessment  Clinical Narrative: 3/29: Admitted to [facility] via 05/14/23 with diagnosis of NSTEMI and acute temporal infarct. At baseline, from home with spouse. Patient was completely IND with ADL's, IADL's, ambulation without AD, & medication management per PT evaluation notes. PT recommendations this am are for Home Health. TOC will follow through disposition   Gabriel Cirri MSN RN CM  RN Case Manager [place]  Transitions of Care Direct Dial: [phone] (Weekends Only) [facility] Main Office Phone: [phone] [facility] Fax: [phone] [email or web address] .     Transition of Care Asessment: Insurance and Status: Insurance coverage has been reviewed Patient has primary care physician: Yes Home environment has been reviewed: From home with spouse who assists with IADL's. Prior level of function:: Mod I, does not drive. DME at home: [employer] 2W, Electric scooter;Grab bars, tub/shower;Hand held shower head Prior/Current Home Services: No current home services Social Drivers of Health Review: SDOH reviewed no interventions necessary Readmission risk has been reviewed: Yes Transition of care needs: transition of care needs identified, TOC will continue to follow (Home Health recommendations as of 3/38/25.)        Patient Goals and CMS Choice            Expected Discharge Plan and Services                                              Prior Living Arrangements/Services                       Activities of Daily Living      Permission Sought/Granted                  Emotional Assessment              Admission diagnosis:  NSTEMI  (non-ST elevated myocardial infarction) [facility]) [I21.4] Patient Active Problem List   Diagnosis Date Noted   History of CVA (cerebrovascular accident) 05/14/2023   Type 2 diabetes mellitus with diabetic polyneuropathy, with long-term current use of insulin (HCC) 01/10/2023   Moderate episode of recurrent major depressive disorder (HCC) 12/06/2022   Obesity (BMI 30-39.9) 02/03/2022   AKI (acute kidney injury) (HCC) 02/02/2022   Hyponatremia 02/02/2022   Non healing left heel wound    Stroke (HCC) 09/18/2021   Coronary artery disease 09/18/2021   Dizziness    PAD (peripheral artery disease) (HCC) 02/26/2021   Debility 01/25/2021   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   Encephalopathy acute    CAP (community acquired pneumonia)    Acute respiratory failure with hypoxia (HCC) 01/13/2021   NSTEMI (non-ST elevated myocardial infarction) (HCC) 01/13/2021   Hyperlipidemia associated with type 2 diabetes mellitus (HCC) 01/13/2021   Tobacco abuse 01/13/2021   Cardiogenic shock (HCC) 01/13/2021   Encounter for screening mammogram for malignant neoplasm of breast 01/03/2019   Polyp of sigmoid colon    Irritable bowel syndrome with diarrhea 12/30/2017   Uncontrolled type 2 diabetes mellitus with hyperglycemia (  HCC) 09/29/2017   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   Depression, major, single episode, moderate (HCC) 09/29/2017   Encounter for general adult medical examination with abnormal findings 06/11/2017   Type 2 diabetes mellitus with hyperglycemia, with long-term current use of insulin (HCC) 06/11/2017   Vitamin D deficiency 06/11/2017   Mixed hyperlipidemia 06/11/2017   Acquired hypothyroidism 06/11/2017   Hypertension associated with type 2 diabetes mellitus (HCC) 03/02/2017   Impingement syndrome of shoulder region 01/24/2016   Carotid stenosis 08/09/2013   Carotid artery stenosis with cerebral infarction [facility]) 08/03/2013   Aftercare following surgery of the circulatory system, NEC  12/08/2012   Occlusion and stenosis of carotid artery without mention of cerebral infarction 04/28/2012   HYPERCHOLESTEROLEMIA 06/01/2006   PANIC ATTACK 06/01/2006   TOBACCO ABUSE 06/01/2006   ALLERGIC RHINITIS 06/01/2006   GERD 06/01/2006   DIVERTICULAR DISEASE 06/01/2006   MIGRAINES, HX OF 06/01/2006   PCP:  Sallyanne Kuster, NP Pharmacy:   [facility] - [place], [place] - [address] [place] [address] Phone: [phone] Fax: [phone]  [facility] (New Address) - [place], [place] - [address] AT Previously: [place], [place] [address] [place] [address] Phone: [phone] Fax: [phone]  [id number] [place], [place] - [address] AT [facility] [address] [place] [address] [place] [address] Phone: [phone] Fax: [phone]  [facility] - [place], [place] - [address] [place] [address] Phone: [phone] Fax: [phone]     Social Drivers of Health (SDOH) Social History: SDOH Screenings   Food Insecurity: No Food Insecurity (05/15/2023)  Housing: Low Risk  (05/15/2023)  Transportation Needs: No Transportation Needs (05/15/2023)  Utilities: Not At Risk (05/15/2023)  Alcohol Screen: Low Risk  (01/21/2023)  Depression (PHQ2-9): High Risk (01/21/2023)  Tobacco Use: Medium Risk (05/14/2023)   SDOH Interventions:     Readmission Risk Interventions     No data to display

## 2023-05-16 NOTE — Evaluation (Signed)
 Physical Therapy Evaluation Patient Details Name: Morgan Daniels MRN: [id number] DOB: 06-09-1960 Today's Date: 05/16/2023  History of Present Illness  Pt admitted to [facility] on 05/14/23 under observation for c/o vision changes and chest pain. 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 Heparin initiated 3/27. Significant PMH includes: CAD s/p CABG x4 2022, multiple CVAs 09/2021 s/p ILR implant, HTN, HLD, T2DM, COPD (prior smoker), PVD s/p SFA stent 2023   Clinical Impression  Pt is a 63 year old F admitted to hospital on 05/14/23 for NSTEMI and CVA. At baseline, pt is completely IND with ADL's, IADL's, ambulation without AD, and medication management.   Pt presents with mild LLE proximal hip weakness, decreased dynamic standing balance, decreased activity tolerance, and c/o blurred vision, resulting in impaired functional mobility from baseline. Due to deficits, pt is IND with transfers and was provided SBA for safety with 68ft of gait, no AD, and to complete stair training. Pt reports that she feels she is near baseline but continues to have impaired vision which affects her balance. Discussed use of RW for energy conservation/balance; she verbalized understanding. Safe for IND ambulation in room.  Deficits limit the pt's ability to safely and independently perform ADL's, transfer, and ambulate. Pt will benefit from acute skilled PT services to address deficits for return to baseline function. Pt will benefit from post acute therapy services to address deficits for return to baseline function.         If plan is discharge home, recommend the following: Assistance with cooking/housework;Help with stairs or ramp for entrance   Can travel by private vehicle    Yes    Equipment Recommendations None recommended by PT     Functional Status Assessment Patient has had a recent decline in their functional status and  demonstrates the ability to make significant improvements in function in a reasonable and predictable amount of time.     Precautions / Restrictions Precautions Precautions: Fall Restrictions Weight Bearing Restrictions Per Provider Order: No      Mobility  Bed Mobility               General bed mobility comments: received standing in room fixing bed and tray table    Transfers Overall transfer level: Independent                 General transfer comment: IND for EOB and toilet transfers without AD, use of UE for support    Ambulation/Gait   Gait Distance (Feet): 40 Feet (24ft x2) Assistive device: None         General Gait Details: SBA for safety to ambulate short distance to stair well and back. Demonstrates slowed cadence, increased lateral trunk deviation, decreased step length/foot clearance bilaterally, and intermittent UE support on wall for steadying.  Stairs Stairs: Yes Stairs assistance: Supervision Stair Management: Step to pattern, One rail Right Number of Stairs: 5 General stair comments: SBA for safety to navigate 5 steps with unilateral railing and step to pattern.      Balance Overall balance assessment: Needs assistance Sitting-balance support: No upper extremity supported, Feet supported Sitting balance-Leahy Scale: Good     Standing balance support: During functional activity, No upper extremity supported Standing balance-Leahy Scale: Fair Standing balance comment: increased lateral trunk deviation with weight shift, intermittent UE support on walls/furniture for steadying  Pertinent Vitals/Pain Pain Assessment Pain Assessment: No/denies pain    Home Living Family/patient expects to be discharged to:: Private residence Living Arrangements: Spouse/significant other Available Help at Discharge: Family;Available PRN/intermittently Type of Home: House Home Access: Stairs to enter    [employer] of Steps: "a few", R railing (more at back entrance)   Home Layout: One level Home Equipment: Agricultural consultant (2 wheels);Electric scooter;Grab bars - tub/shower;Hand held shower head      Prior Function Prior Level of Function : Independent/Modified Independent             Mobility Comments: independent, no falls hx ADLs Comments: independent with ADLs, husband assists with IADLs (pt does not drive)     Extremity/Trunk Assessment   Upper Extremity Assessment Upper Extremity Assessment: Defer to OT evaluation    Lower Extremity Assessment Lower Extremity Assessment: Overall WFL for tasks assessed;LLE deficits/detail (RLE grossly 4+/5, sensation intact) LLE Deficits / Details: hip flexion 3+/5, otherwise grossly 4+/5 LLE Sensation: WNL LLE Coordination: WNL       Communication   Communication Communication: Impaired Factors Affecting Communication: Difficulty expressing self    Cognition Arousal: Alert Behavior During Therapy: Flat affect                             Following commands: Impaired Following commands impaired: Follows multi-step commands with increased time     Cueing Cueing Techniques: Verbal cues     General Comments General comments (skin integrity, edema, etc.): BP 160/87 (109) HR 97, on RA 95%    Exercises Other Exercises Other Exercises: Pt educated re: PT role/POC, DC recommendations, safety with functional mobility, use of DME for energy conservation/safety with functional mobility and ADL's, benefits of participation with PT/OT in acute phase for neuroplasticity. She verbalized understanding.   Assessment/Plan    PT Assessment Patient needs continued PT services  PT Problem List Decreased strength;Decreased activity tolerance;Decreased balance       PT Treatment Interventions DME instruction;Gait training;Stair training;Functional mobility training;Therapeutic activities;Therapeutic exercise;Balance  training;Neuromuscular re-education;Patient/family education    PT Goals (Current goals can be found in the Care Plan section)  Acute Rehab PT Goals Patient Stated Goal: "go home" PT Goal Formulation: With patient Time For Goal Achievement: 05/30/23 Potential to Achieve Goals: Good    Frequency Min 1X/week        AM-PAC PT "6 Clicks" Mobility  Outcome Measure Help needed turning from your back to your side while in a flat bed without using bedrails?: None Help needed moving from lying on your back to sitting on the side of a flat bed without using bedrails?: None Help needed moving to and from a bed to a chair (including a wheelchair)?: None Help needed standing up from a chair using your arms (e.g., wheelchair or bedside chair)?: None Help needed to walk in hospital room?: A Little Help needed climbing 3-5 steps with a railing? : A Little 6 Click Score: 22    End of Session Equipment Utilized During Treatment: Gait belt Activity Tolerance: Patient tolerated treatment well Patient left: in bed;with call bell/phone within reach (left seated EOB per pt request; no bed alarm indicated, appropriate for IND ambulation in room) Nurse Communication: Mobility status PT Visit Diagnosis: Unsteadiness on feet (R26.81);Muscle weakness (generalized) (M62.81);Hemiplegia and hemiparesis Hemiplegia - Right/Left: Left Hemiplegia - caused by: Cerebral infarction    Time: 1610-9604 PT Time Calculation (min) (ACUTE ONLY): 16 min   Charges:   PT Evaluation $  PT Eval Low Complexity: 1 Low PT Treatments $Therapeutic Activity: 8-22 mins PT General Charges $$ ACUTE PT VISIT: 1 Visit         Vira Blanco, PT, DPT 10:35 AM,05/16/23 Physical Therapist - [facility]

## 2023-05-16 NOTE — Plan of Care (Signed)
 Problem: Education: Goal: Understanding of cardiac disease, CV risk reduction, and recovery process will improve Outcome: Completed/Met Goal: Individualized Educational Video(s) Outcome: Completed/Met   Problem: Activity: Goal: Ability to tolerate increased activity will improve Outcome: Completed/Met   Problem: Cardiac: Goal: Ability to achieve and maintain adequate cardiovascular perfusion will improve Outcome: Completed/Met   Problem: Health Behavior/Discharge Planning: Goal: Ability to safely manage health-related needs after discharge will improve Outcome: Completed/Met   Problem: Education: Goal: Ability to describe self-care measures that may prevent or decrease complications (Diabetes Survival Skills Education) will improve Outcome: Completed/Met Goal: Individualized Educational Video(s) Outcome: Completed/Met   Problem: Coping: Goal: Ability to adjust to condition or change in health will improve Outcome: Completed/Met   Problem: Fluid Volume: Goal: Ability to maintain a balanced intake and output will improve Outcome: Completed/Met   Problem: Health Behavior/Discharge Planning: Goal: Ability to identify and utilize available resources and services will improve Outcome: Completed/Met Goal: Ability to manage health-related needs will improve Outcome: Completed/Met   Problem: Metabolic: Goal: Ability to maintain appropriate glucose levels will improve Outcome: Completed/Met   Problem: Nutritional: Goal: Maintenance of adequate nutrition will improve Outcome: Completed/Met Goal: Progress toward achieving an optimal weight will improve Outcome: Completed/Met   Problem: Skin Integrity: Goal: Risk for impaired skin integrity will decrease Outcome: Completed/Met   Problem: Tissue Perfusion: Goal: Adequacy of tissue perfusion will improve Outcome: Completed/Met   Problem: Education: Goal: Knowledge of disease or condition will improve Outcome:  Completed/Met Goal: Knowledge of secondary prevention will improve (MUST DOCUMENT ALL) Outcome: Completed/Met Goal: Knowledge of patient specific risk factors will improve (DELETE if not current risk factor) Outcome: Completed/Met   Problem: Ischemic Stroke/TIA Tissue Perfusion: Goal: Complications of ischemic stroke/TIA will be minimized Outcome: Completed/Met   Problem: Coping: Goal: Will verbalize positive feelings about self Outcome: Completed/Met Goal: Will identify appropriate support needs Outcome: Completed/Met   Problem: Health Behavior/Discharge Planning: Goal: Ability to manage health-related needs will improve Outcome: Completed/Met Goal: Goals will be collaboratively established with patient/family Outcome: Completed/Met   Problem: Self-Care: Goal: Ability to participate in self-care as condition permits will improve Outcome: Completed/Met Goal: Verbalization of feelings and concerns over difficulty with self-care will improve Outcome: Completed/Met Goal: Ability to communicate needs accurately will improve Outcome: Completed/Met   Problem: Nutrition: Goal: Risk of aspiration will decrease Outcome: Completed/Met Goal: Dietary intake will improve Outcome: Completed/Met   Problem: Education: Goal: Knowledge of General Education information will improve Description: Including pain rating scale, medication(s)/side effects and non-pharmacologic comfort measures Outcome: Completed/Met   Problem: Health Behavior/Discharge Planning: Goal: Ability to manage health-related needs will improve Outcome: Completed/Met   Problem: Clinical Measurements: Goal: Ability to maintain clinical measurements within normal limits will improve Outcome: Completed/Met Goal: Will remain free from infection Outcome: Completed/Met Goal: Diagnostic test results will improve Outcome: Completed/Met Goal: Respiratory complications will improve Outcome: Completed/Met Goal:  Cardiovascular complication will be avoided Outcome: Completed/Met   Problem: Activity: Goal: Risk for activity intolerance will decrease Outcome: Completed/Met   Problem: Nutrition: Goal: Adequate nutrition will be maintained Outcome: Completed/Met   Problem: Coping: Goal: Level of anxiety will decrease Outcome: Completed/Met   Problem: Elimination: Goal: Will not experience complications related to bowel motility Outcome: Completed/Met Goal: Will not experience complications related to urinary retention Outcome: Completed/Met   Problem: Pain Managment: Goal: General experience of comfort will improve and/or be controlled Outcome: Completed/Met   Problem: Safety: Goal: Ability to remain free from injury will improve Outcome: Completed/Met   Problem: Skin Integrity: Goal: Risk for impaired  skin integrity will decrease Outcome: Completed/Met

## 2023-05-16 NOTE — Progress Notes (Signed)
 PHARMACY - ANTICOAGULATION CONSULT NOTE  Pharmacy Consult for heparin infusion  Indication: chest pain/ACS/stroke   Allergies  Allergen Reactions   Simvastatin Diarrhea and Nausea And Vomiting   Morphine And Codeine Itching   Lactose Intolerance (Gi) Diarrhea    bloating   Latex Rash    Rash when she wears gloves (Negative by test - per pt)    Patient Measurements: Height: 5\' 7"  (170.2 cm) Weight: 88.7 kg (195 lb 9.6 oz) IBW/kg (Calculated) : 61.6 HEPARIN DW (KG): 80.5  Vital Signs: Temp: 98 F (36.7 C) (03/29 0012) BP: 181/87 (03/29 0012) Pulse Rate: 82 (03/29 0012)  Labs: Recent Labs    05/14/23 1425 05/14/23 1537 05/14/23 1634 05/15/23 0511 05/15/23 0742 05/15/23 1603 05/15/23 1839 05/15/23 2246  HGB 13.2  --   --  12.8  --   --   --   --   HCT 39.9  --   --  38.9  --   --   --   --   PLT 379  --   --  357  --   --   --   --   APTT  --  29  --   --   --   --   --   --   LABPROT  --  14.0  --   --   --   --   --   --   INR  --  1.1  --   --   --   --   --   --   HEPARINUNFRC  --   --    < >  --  0.23* 0.17*  --  0.25*  CREATININE 1.12*  --   --  0.83  --   --   --   --   TROPONINIHS 54*  --    < > 166*  --  99* 87*  --    < > = values in this interval not displayed.    Estimated Creatinine Clearance: 80.3 mL/min (by C-G formula based on SCr of 0.83 mg/dL).   Medical History: Past Medical History:  Diagnosis Date   Anxiety    Arthritis    Bilateral carotid artery stenosis 2014   Carotid artery occlusion    Chronic kidney disease 06/18/2015   UTI   Chronic kidney disease 2017   Current smoker    CVA (cerebral vascular accident) (HCC) 2013   Depression    Diabetes (HCC)    Diverticulosis    Fatty liver    Fibromyalgia    GERD (gastroesophageal reflux disease)    H/O hiatal hernia    Heart attack (HCC) 01/12/2021   Hiatal hernia    Hypercholesteremia    Hypertension    IBS (irritable bowel syndrome)    PAD (peripheral artery disease) (HCC)     Peptic ulcer    Plantar fasciitis    Stroke (HCC) 01/31/2012   Right side-ministroke   T2DM (type 2 diabetes mellitus) (HCC)     Assessment: 63 yo female with admitted with sharp chest pain and palpitations. Patient has PMH of CVA, PAD, CAD s/p CABG in 2022. Pharmacy consulted for heparin infusion dosing and monitoring for NSTEMI.  Neurology agrees with heparin gtt; benefit in NSTEMI felt to outweigh the risk of hemorrhagic conversion of this small stroke.   Troponin HS 54>> 84  Date Time HL Rate/Comment 3/28 0008 <0.1 SUBtherapeutic 3/28 0742 0.23 SUBtherapeutic 3/28 1603 0.17 SUBtherapeutic  Goal of Therapy:  Heparin level 0.3-0.5  pt w/ stroke and ACS  Monitor platelets by anticoagulation protocol: Yes   Plan:  3/28:  HL @ 2246 = 0.25, SUBtherapeutic - Will increase heparin drip rate to 1650 units/hr and recheck HL 6 hrs after rate change  Monitor CBC and signs/symptoms of bleeding  Thank you for involving pharmacy in this patient's care.   Erica Osuna D Clinical Pharmacist 05/16/2023

## 2023-05-16 NOTE — Progress Notes (Signed)
 PHARMACY - ANTICOAGULATION CONSULT NOTE  Pharmacy Consult for heparin infusion  Indication: chest pain/ACS/stroke   Allergies  Allergen Reactions   Simvastatin Diarrhea and Nausea And Vomiting   Morphine And Codeine Itching   Lactose Intolerance (Gi) Diarrhea    bloating   Latex Rash    Rash when she wears gloves (Negative by test - per pt)    Patient Measurements: Height: 5\' 7"  (170.2 cm) Weight: 81.6 kg (179 lb 12.8 oz) IBW/kg (Calculated) : 61.6 HEPARIN DW (KG): 80.5  Vital Signs: Temp: 97.6 F (36.4 C) (03/29 0733) Temp Source: Oral (03/29 0733) BP: 156/84 (03/29 0733) Pulse Rate: 90 (03/29 0733)  Labs: Recent Labs    05/14/23 1425 05/14/23 1537 05/14/23 1634 05/15/23 0511 05/15/23 0742 05/15/23 1603 05/15/23 1839 05/15/23 2246 05/16/23 0649  HGB 13.2  --   --  12.8  --   --   --   --  12.8  HCT 39.9  --   --  38.9  --   --   --   --  38.7  PLT 379  --   --  357  --   --   --   --  357  APTT  --  29  --   --   --   --   --   --   --   LABPROT  --  14.0  --   --   --   --   --   --   --   INR  --  1.1  --   --   --   --   --   --   --   HEPARINUNFRC  --   --    < >  --    < > 0.17*  --  0.25* 0.40  CREATININE 1.12*  --   --  0.83  --   --   --   --  0.96  TROPONINIHS 54*  --    < > 166*  --  99* 87*  --   --    < > = values in this interval not displayed.    Estimated Creatinine Clearance: 66.8 mL/min (by C-G formula based on SCr of 0.96 mg/dL).   Medical History: Past Medical History:  Diagnosis Date   Anxiety    Arthritis    Bilateral carotid artery stenosis 2014   Carotid artery occlusion    Chronic kidney disease 06/18/2015   UTI   Chronic kidney disease 2017   Current smoker    CVA (cerebral vascular accident) (HCC) 2013   Depression    Diabetes (HCC)    Diverticulosis    Fatty liver    Fibromyalgia    GERD (gastroesophageal reflux disease)    H/O hiatal hernia    Heart attack (HCC) 01/12/2021   Hiatal hernia    Hypercholesteremia     Hypertension    IBS (irritable bowel syndrome)    PAD (peripheral artery disease) (HCC)    Peptic ulcer    Plantar fasciitis    Stroke (HCC) 01/31/2012   Right side-ministroke   T2DM (type 2 diabetes mellitus) (HCC)     Assessment: 63 yo female with admitted with sharp chest pain and palpitations. Patient has PMH of CVA, PAD, CAD s/p CABG in 2022. Pharmacy consulted for heparin infusion dosing and monitoring for NSTEMI.  Neurology agrees with heparin gtt; benefit in NSTEMI felt to outweigh the risk of hemorrhagic conversion of this small stroke.  Troponin HS 54>> 84  Date Time HL Rate/Comment 3/28 0008 <0.1 SUBtherapeutic 3/28 0742 0.23 SUBtherapeutic 3/28 1603 0.17 SUBtherapeutic 3/28 2246 0.25 SUBtherapeutic 3/29 0649 0.40 Therapeutic x 1  Goal of Therapy:  Heparin level 0.3-0.5  pt w/ stroke and ACS  Monitor platelets by anticoagulation protocol: Yes   Plan:  - Will continue heparin drip rate to 1650 units/hr - Check confirmatory HL in 6 hrs  - Monitor CBC and signs/symptoms of bleeding  Thank you for involving pharmacy in this patient's care.   Bettey Costa Clinical Pharmacist 05/16/2023

## 2023-05-16 NOTE — Evaluation (Signed)
 Occupational Therapy Evaluation Patient Details Name: Morgan Daniels MRN: [id number] DOB: [birth year] Today's Date: 05/16/2023   History of Present Illness   Pt admitted to [facility] on 05/14/23 under observation for c/o vision changes and chest pain. 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 Heparin initiated 3/27. 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 Lives at home with spouse who assists with driving. On OT eval, pt presents with mild LUE deficits in FMC/GMC/sensation, decreased balance and activity tolerance. Pt reports feeling very "sleepy and having a hard time waking up today". Pt states she feels at or near baseline level of performance. Overall, pt requires CGA progressing to supervision for functional transfers, toileting tasks and grooming activities standing at sink. Completes mobility t/f bathroom no AD and CGA-supervision. Pt would benefit from skilled OT services to address noted impairments and functional limitations (see below for any additional details) in order to maximize safety and independence while minimizing falls risk and caregiver burden. Anticipate the need for follow up [facility] OT services upon acute hospital DC.      If plan is discharge home, recommend the following:   Assist for transportation;A little help with walking and/or transfers;A little help with bathing/dressing/bathroom     Functional Status Assessment   Patient has had a recent decline in their functional status and demonstrates the ability to make significant improvements in function in a reasonable and predictable amount of time.     Equipment Recommendations   None recommended by OT      Precautions/Restrictions    Precautions Precautions: Fall Restrictions Weight Bearing Restrictions Per Provider Order: No     Mobility Bed Mobility Overal bed mobility: Needs Assistance Bed Mobility: Supine to Sit, Sit to Supine     Supine to sit: Supervision Sit to supine: Supervision        Transfers Overall transfer level: Needs assistance Equipment used: None Transfers: Bed to chair/wheelchair/BSC, Sit to/from Stand Sit to Stand: Contact guard assist     Step pivot transfers: Contact guard assist     General transfer comment: CGA from reg height bed and to reg height toilet      Balance Overall balance assessment: Needs assistance Sitting-balance support: No upper extremity supported, Feet supported Sitting balance-Leahy Scale: Good     Standing balance support: During functional activity, No upper extremity supported Standing balance-Leahy Scale: Fair Standing balance comment: stands at sink to wash hands, reaches outside BOS without LOB to throw away paper towels, CGA                           ADL either performed or assessed with clinical judgement   ADL Overall ADL's : Needs assistance/impaired Eating/Feeding: Independent;Bed level   Grooming: Wash/dry hands;Standing;Supervision/safety;Contact guard assist Grooming Details (indicate cue type and reason): no LOB while reaching outside BOS for paper towels, trash can                 Toilet Transfer: Contact guard Marine scientist Details (indicate cue type and reason): CGA progressng to supervision Toileting- Clothing Manipulation and Hygiene: Supervision/safety       Functional mobility during ADLs: Supervision/safety;Contact guard assist General ADL Comments: t/f bathroom no AD  Vision Baseline Vision/History: 1 Wears glasses Ability to See in Adequate Light: 0 Adequate Patient Visual Report: No change from baseline Vision Assessment?: Wears glasses for reading;Wears glasses for  driving Additional Comments: reads clock and name badge [facility]            Pertinent Vitals/Pain Pain Assessment Pain Assessment: No/denies pain     Extremity/Trunk Assessment Upper Extremity Assessment Upper Extremity Assessment: Right hand dominant;Difficult to assess due to impaired cognition;LUE deficits/detail;RUE deficits/detail RUE Deficits / Details: grossly 4/5 MMT, fair grip RUE Coordination: WNL LUE Deficits / Details: grossly 4/5 MMT, fair grip LUE Sensation: decreased light touch LUE Coordination: decreased fine motor;decreased gross motor   Lower Extremity Assessment Lower Extremity Assessment: Difficult to assess due to impaired cognition;Defer to PT evaluation LLE Deficits / Details: hip flexion 3+/5, otherwise grossly 4+/5 LLE Sensation: WNL LLE Coordination: WNL       Communication Communication Communication: Impaired Factors Affecting Communication: Difficulty expressing self   Cognition Arousal: Lethargic (pt keeping eyes closed frequently, limited participation, kept saying she was "sleepy") Behavior During Therapy: Flat affect Cognition: No family/caregiver present to determine baseline, Difficult to assess Difficult to assess due to: Level of arousal           OT - Cognition Comments: Pt alert to self and general situation, reports being very "sleepy", interacts minimaly with therapist, increased time to answer all questions and to perform UE/LE assessments seated EOB. Unsure if this is due to cognition or simply lack of sleep. No family present to verify, noted SLUMS administration by SLP yesterday with score indicating mild neurocognitive deficits and pt's family stating she is at/near baseline. Will continue to assess.                 Following commands: Impaired Following commands impaired: Follows one step commands with increased time     Cueing  General Comments   Cueing Techniques: Verbal cues  BP 160/87 (109) HR 97, on RA 95%            Home Living Family/patient expects to be discharged to:: Private residence Living Arrangements: Spouse/significant other Available Help at Discharge: Family;Available PRN/intermittently Type of Home: House Home Access: Stairs to enter [employer] of Steps: "a few", R railing (more at back entrance)   Home Layout: One level     Bathroom Shower/Tub: Producer, television/film/video: Standard Bathroom Accessibility: Yes   Home Equipment: Agricultural consultant (2 wheels);Electric scooter;Grab bars - tub/shower;Hand held shower head          Prior Functioning/Environment Prior Level of Function : Independent/Modified Independent             Mobility Comments: independent, no falls hx ADLs Comments: independent with ADLs, husband assists with IADLs (pt does not drive)    OT Problem List: Decreased activity tolerance;Impaired balance (sitting and/or standing);Decreased coordination;Decreased strength;Decreased range of motion   OT Treatment/Interventions: Self-care/ADL training;Therapeutic exercise;Neuromuscular education;Energy conservation;Therapeutic activities;Patient/family education;Balance training      OT Goals(Current goals can be found in the care plan section)   Acute Rehab OT Goals OT Goal Formulation: With patient Time For Goal Achievement: 05/30/23 Potential to Achieve Goals: Good   OT Frequency:  Min 3X/week       AM-PAC OT "6 Clicks" Daily Activity     Outcome Measure Help from another person eating meals?: None Help from another person taking care of personal grooming?: None Help from another person toileting, which includes using toliet, bedpan, or urinal?: A Little  Help from another person bathing (including washing, rinsing, drying)?: A Little Help from another person to put on and taking off regular upper body clothing?: None Help from another person to put on and taking off regular lower body clothing?: A Little 6 Click Score: 21    End of Session Equipment Utilized During Treatment: Gait belt Nurse Communication: Mobility status  Activity Tolerance: Patient tolerated treatment well;Patient limited by fatigue Patient left: in bed;with call bell/phone within reach;with bed alarm set  OT Visit Diagnosis: Muscle weakness (generalized) (M62.81);Other abnormalities of gait and mobility (R26.89)                Time: 1610-9604 OT Time Calculation (min): 32 min Charges:  OT General Charges $OT Visit: 1 Visit OT Evaluation $OT Eval Low Complexity: 1 Low OT Treatments $Self Care/Home Management : 23-37 mins  Tamilyn Lupien L. Santanna Olenik, OTR/L  05/16/23, 10:48 AM

## 2023-05-16 NOTE — TOC Progression Note (Signed)
 Transition of Care [facility]) - Progression Note    Patient Details  Name: Morgan Daniels MRN: [id number] Date of Birth: 1960/09/11  Transition of Care [facility]) CM/SW Contact  Bing Quarry, RN Phone Number: 05/16/2023, 1:27 PM  Clinical Narrative:  05/16/23: Sherron Monday with patient regarding physical therapy recommendations for home health on discharge, but patient declined. Informed to contact PCP for home health needs after discharge from acute care setting today. To follow up with PCP in one week. Confirmed PCP as listed in EPIC.    Gabriel Cirri MSN RN CM  RN Case Manager [place]  Transitions of Care Direct Dial: [phone] (Weekends Only) [facility] Main Office Phone: [phone] [facility] Fax: [phone] [email or web address]          Expected Discharge Plan and Services         Expected Discharge Date: 05/16/23                                     Social Determinants of Health (SDOH) Interventions SDOH Screenings   Food Insecurity: No Food Insecurity (05/15/2023)  Housing: Low Risk  (05/15/2023)  Transportation Needs: No Transportation Needs (05/15/2023)  Utilities: Not At Risk (05/15/2023)  Alcohol Screen: Low Risk  (01/21/2023)  Depression (PHQ2-9): High Risk (01/21/2023)  Tobacco Use: Medium Risk (05/14/2023)    Readmission Risk Interventions     No data to display

## 2023-05-16 NOTE — Progress Notes (Signed)
 Patient ID: Morgan Daniels, female   DOB: 1961-02-17, 63 y.o.   MRN: [id number] [facility]    SUBJECTIVE: Patient resting comfortably in bed no dizziness no headaches denies shortness of breath or chest pain no palpitations or tachycardia   Vitals:   05/16/23 0429 05/16/23 0531 05/16/23 0733 05/16/23 1225  BP: (!) 178/88  (!) 156/84 (!) 142/87  Pulse: 81  90 (!) 107  Resp: 18  14 20   Temp: 97.6 F (36.4 C)  97.6 F (36.4 C)   TempSrc:   Oral   SpO2: 92%  97% 95%  Weight:  81.6 kg    Height:         Intake/Output Summary (Last 24 hours) at 05/16/2023 1539 Last data filed at 05/16/2023 0640 Gross per 24 hour  Intake 942.25 ml  Output 3 ml  Net 939.25 ml      PHYSICAL EXAM  General: Well developed, well nourished, in no acute distress HEENT:  Normocephalic and atramatic Neck:  No JVD.  Lungs: Clear bilaterally to auscultation and percussion. Heart: HRRR . Normal S1 and S2 without gallops or murmurs.  Abdomen: Bowel sounds are positive, abdomen soft and non-tender  Msk:  Back normal, normal gait. Normal strength and tone for age. Extremities: No clubbing, cyanosis or edema.   Neuro: Alert and oriented X 3. Psych:  Good affect, responds appropriately   LABS: Basic Metabolic Panel: Recent Labs    05/14/23 1425 05/15/23 0511 05/16/23 0649  NA 139 140 137  K 3.8 3.5 3.4*  CL 104 105 103  CO2 24 24 24   GLUCOSE 210* 155* 182*  BUN 27* 26* 24*  CREATININE 1.12* 0.83 0.96  CALCIUM 9.7 9.5 9.3  MG 2.1  --  2.2  PHOS  --   --  4.8*   Liver Function Tests: No results for input(s): "AST", "ALT", "ALKPHOS", "BILITOT", "PROT", "ALBUMIN" in the last 72 hours. No results for input(s): "LIPASE", "AMYLASE" in the last 72 hours. CBC: Recent Labs    05/15/23 0511 05/16/23 0649  WBC 7.8 7.1  HGB 12.8 12.8  HCT 38.9 38.7  MCV 80.4 78.7*  PLT 357 357   Cardiac Enzymes: No results for input(s): "CKTOTAL", "CKMB", "CKMBINDEX", "TROPONINI" in the last 72  hours. BNP: Invalid input(s): "POCBNP" D-Dimer: No results for input(s): "DDIMER" in the last 72 hours. Hemoglobin A1C: No results for input(s): "HGBA1C" in the last 72 hours. Fasting Lipid Panel: Recent Labs    05/15/23 0511  CHOL 234*  HDL 32*  LDLCALC UNABLE TO CALCULATE IF TRIGLYCERIDE OVER 400 mg/dL  TRIG 557*  CHOLHDL 7.3  LDLDIRECT 138*   Thyroid Function Tests: Recent Labs    05/15/23 1603  TSH 1.013   Anemia Panel: No results for input(s): "VITAMINB12", "FOLATE", "FERRITIN", "TIBC", "IRON", "RETICCTPCT" in the last 72 hours.  ECHOCARDIOGRAM COMPLETE Result Date: 05/15/2023    ECHOCARDIOGRAM REPORT   Patient Name:   Morgan Daniels Date of Exam: 05/15/2023 Medical Rec #:  [id number]      Height:       67.0 in Accession #:    [id number]     Weight:       195.6 lb Date of Birth:  21-Nov-1960      BSA:          2.003 m Patient Age:    63 years       BP:           184/85 mmHg Patient Gender: F  HR:           80 bpm. Exam Location:  [facility] Procedure: 2D Echo, Cardiac Doppler, Color Doppler and Strain Analysis (Both            Spectral and Color Flow Doppler were utilized during procedure). Indications:     NSTEMI  History:         Patient has prior history of Echocardiogram examinations, most                  recent 01/13/2021. Acute MI and CAD, Prior CABG, PAD and                  Stroke, Signs/Symptoms:Dizziness/Lightheadedness; Risk                  Factors:Hypertension, Diabetes and Dyslipidemia.  Sonographer:     Mikki Harbor Referring Phys:  [id number] Verdene Lennert Diagnosing Phys: Alwyn Pea MD  Sonographer Comments: Global longitudinal strain was attempted. IMPRESSIONS  1. Left ventricular ejection fraction, by estimation, is 65 to 70%. The left ventricle has normal function. The left ventricle has no regional wall motion abnormalities. There is mild concentric left ventricular hypertrophy. Left ventricular diastolic parameters are consistent with Grade I  diastolic dysfunction (impaired relaxation). The average left ventricular global longitudinal strain is 11.0 %.  2. Right ventricular systolic function is normal. The right ventricular size is normal. There is normal pulmonary artery systolic pressure.  3. Posterior leaflet of the mitral valve leaflets.  4. The mitral valve is degenerative. Trivial mitral valve regurgitation.  5. The aortic valve is normal in structure. Aortic valve regurgitation is not visualized. FINDINGS  Left Ventricle: Left ventricular ejection fraction, by estimation, is 65 to 70%. The left ventricle has normal function. The left ventricle has no regional wall motion abnormalities. The average left ventricular global longitudinal strain is 11.0 %. Strain was performed and the global longitudinal strain is indeterminate. The left ventricular internal cavity size was normal in size. There is mild concentric left ventricular hypertrophy. Left ventricular diastolic parameters are consistent with Grade  I diastolic dysfunction (impaired relaxation). Right Ventricle: The right ventricular size is normal. No increase in right ventricular wall thickness. Right ventricular systolic function is normal. There is normal pulmonary artery systolic pressure. The tricuspid regurgitant velocity is 2.35 m/s, and  with an assumed right atrial pressure of 3 mmHg, the estimated right ventricular systolic pressure is 25.1 mmHg. Left Atrium: Left atrial size was normal in size. Right Atrium: Right atrial size was normal in size. Pericardium: There is no evidence of pericardial effusion. Mitral Valve: Posterior leaflet frozen thickened and calcified. The mitral valve is degenerative in appearance. There is moderate thickening of the posterior mitral valve leaflet(s). There is moderate calcification of the posterior mitral valve leaflet(s). Posterior leaflet of the mitral valve leaflets. Trivial mitral valve regurgitation. MV peak gradient, 4.8 mmHg. The mean mitral  valve gradient is 2.0 mmHg. Tricuspid Valve: The tricuspid valve is normal in structure. Tricuspid valve regurgitation is trivial. Aortic Valve: The aortic valve is normal in structure. Aortic valve regurgitation is not visualized. Aortic valve mean gradient measures 4.0 mmHg. Aortic valve peak gradient measures 8.4 mmHg. Aortic valve area, by VTI measures 3.05 cm. Pulmonic Valve: The pulmonic valve was normal in structure. Pulmonic valve regurgitation is not visualized. Aorta: The ascending aorta was not well visualized. IAS/Shunts: No atrial level shunt detected by color flow Doppler. Additional Comments: 3D was performed not requiring image post processing on an  independent workstation and was indeterminate.  LEFT VENTRICLE PLAX 2D LVIDd:         5.10 cm     Diastology LVIDs:         3.10 cm     LV e' medial:    4.90 cm/s LV PW:         1.20 cm     LV E/e' medial:  13.8 LV IVS:        1.20 cm     LV e' lateral:   10.90 cm/s LVOT diam:     2.10 cm     LV E/e' lateral: 6.2 LV SV:         74 LV SV Index:   37          2D Longitudinal Strain LVOT Area:     3.46 cm    2D Strain GLS (A4C):   9.8 %                            2D Strain GLS (A3C):   13.0 %                            2D Strain GLS (A2C):   10.0 % LV Volumes (MOD)           2D Strain GLS Avg:     11.0 % LV vol d, MOD A2C: 76.3 ml LV vol d, MOD A4C: 79.0 ml LV vol s, MOD A2C: 24.6 ml LV vol s, MOD A4C: 37.1 ml LV SV MOD A2C:     51.7 ml LV SV MOD A4C:     79.0 ml LV SV MOD BP:      46.8 ml RIGHT VENTRICLE RV Basal diam:  3.95 cm RV Mid diam:    4.00 cm RV S prime:     10.30 cm/s LEFT ATRIUM             Index        RIGHT ATRIUM           Index LA diam:        4.60 cm 2.30 cm/m   RA Area:     21.90 cm LA Vol (A2C):   64.3 ml 32.10 ml/m  RA Volume:   65.60 ml  32.75 ml/m LA Vol (A4C):   52.8 ml 26.36 ml/m LA Biplane Vol: 61.8 ml 30.85 ml/m  AORTIC VALVE                    PULMONIC VALVE AV Area (Vmax):    2.72 cm     PV Vmax:       1.10 m/s AV Area  (Vmean):   2.80 cm     PV Peak grad:  4.8 mmHg AV Area (VTI):     3.05 cm AV Vmax:           145.00 cm/s AV Vmean:          85.800 cm/s AV VTI:            0.243 m AV Peak Grad:      8.4 mmHg AV Mean Grad:      4.0 mmHg LVOT Vmax:         114.00 cm/s LVOT Vmean:        69.300 cm/s LVOT VTI:          0.214 m LVOT/AV VTI ratio:  0.88  AORTA Ao Root diam: 3.50 cm Ao Asc diam:  3.60 cm MITRAL VALVE                TRICUSPID VALVE MV Area (PHT): 2.23 cm     TR Peak grad:   22.1 mmHg MV Area VTI:   2.44 cm     TR Vmax:        235.00 cm/s MV Peak grad:  4.8 mmHg MV Mean grad:  2.0 mmHg     SHUNTS MV Vmax:       1.09 m/s     Systemic VTI:  0.21 m MV Vmean:      66.8 cm/s    Systemic Diam: 2.10 cm MV Decel Time: 340 msec MV E velocity: 67.60 cm/s MV A velocity: 107.00 cm/s MV E/A ratio:  0.63 Holbert Caples D Jamyson Jirak MD Electronically signed by Alwyn Pea MD Signature Date/Time: 05/15/2023/5:42:25 PM    Final    MR BRAIN WO CONTRAST Result Date: 05/14/2023 CLINICAL DATA:  Neuro deficit, concern for stroke. Palpitations, diaphoresis, chest pain, feeling unwell. EXAM: MRI HEAD WITHOUT CONTRAST TECHNIQUE: Multiplanar, multiecho pulse sequences of the brain and surrounding structures were obtained without intravenous contrast. COMPARISON:  CTA head and neck earlier same day. FINDINGS: Brain: There is a 12 x 6 mm focus of restricted diffusion in the left temporal periventricular white matter likely involving the tail of the caudate nucleus and partially extending into the temporal stem white matter. No evidence of intracranial hemorrhage. Scattered and confluent FLAIR signal abnormality in the periventricular and subcortical white matter. Remote lacunar infarcts in the bilateral basal ganglia and in the right corona radiata and bilateral centrum semiovale. No mass lesion or midline shift. Cerebellum is unremarkable. Normal appearance of midline structures. The basilar cisterns are patent. No extra-axial fluid collections.  Ventricles: Prominence of the lateral ventricles suggestive of underlying parenchymal volume loss. Vascular: Skull base flow voids are visualized. Skull and upper cervical spine: No focal abnormality. Sinuses/Orbits: Orbits are symmetric. Paranasal sinuses are clear. Other: Mastoid air cells are clear. IMPRESSION: Acute infarct in the left temporal periventricular white matter involving the tail of the caudate nucleus. Moderate chronic microvascular ischemic changes and mild to moderate parenchymal volume loss. Multiple remote infarcts as above. Electronically Signed   By: Emily Filbert M.D.   On: 05/14/2023 23:59   CT ANGIO HEAD NECK W WO CM Result Date: 05/14/2023 CLINICAL DATA:  63 year old female with chest pain and palpitations, feeling unwell. Vision changes, feels like she is leaning to the left. EXAM: CT ANGIOGRAPHY HEAD AND NECK WITH AND WITHOUT CONTRAST TECHNIQUE: Multidetector CT imaging of the head and neck was performed using the standard protocol during bolus administration of intravenous contrast. Multiplanar CT image reconstructions and MIPs were obtained to evaluate the vascular anatomy. Carotid stenosis measurements (when applicable) are obtained utilizing NASCET criteria, using the distal internal carotid diameter as the denominator. RADIATION DOSE REDUCTION: This exam was performed according to the departmental dose-optimization program which includes automated exposure control, adjustment of the mA and/or kV according to patient size and/or use of iterative reconstruction technique. CONTRAST:  75mL OMNIPAQUE IOHEXOL 350 MG/ML SOLN COMPARISON:  Brain MRI 09/18/2021. Head CT 02/01/2022. CTA head and neck 09/18/2021. FINDINGS: CT HEAD Brain: Chronic small vessel disease. Patchy and confluent bilateral cerebral white matter hypodensity with chronic lacunar infarcts in the bilateral corona radiata, deep gray nuclei. Stable non contrast CT appearance of the brain. Since 2023. No midline shift,  ventriculomegaly, mass effect, evidence  of mass lesion, intracranial hemorrhage or evidence of cortically based acute infarction. Calvarium and skull base: Stable, intact. Paranasal sinuses: Visualized paranasal sinuses and mastoids are stable and well aerated. Orbits: Visualized orbits and scalp soft tissues are within normal limits. CTA NECK Skeleton: Chronic sternotomy. Mild for age cervical spine degeneration. No acute osseous abnormality identified. Upper chest: CABG.  Otherwise negative. Other neck: Neck soft tissue spaces are stable since 2023 and within normal limits. Aortic arch: Extensive Calcified aortic atherosclerosis. Three vessel arch. Right carotid system: Brachiocephalic artery and right CCA plaque without stenosis. Capacious right carotid bifurcation and regional surgical clips suggesting chronic endarterectomy. Residual soft and calcified plaque along the posterior and lateral bifurcation is stable without stenosis. Mostly calcified plaque beginning distal to the right ICA bulb and continuing to the skull base is stable without stenosis. Left carotid system: Extensive left CCA origin plaque appears stable without stenosis. Left CCA plaque continues to the left carotid bifurcation. Chronic low-density plaque along the medial vessel just proximal to the bifurcation on series 6, image 115. More calcified plaque at the bifurcation. But no associated stenosis. Combined soft and calcified plaque along the medial and posterior vessel just below the skull base is stable and without stenosis (series 6, image 78). Vertebral arteries: Right subclavian origin plaque without stenosis. Right vertebral artery remains patent. With intermittent atherosclerosis to the skull base. Only mild stenosis results. Proximal left subclavian atherosclerosis without stenosis. Left vertebral artery origin is patent. Calcified plaque beginning in the left V1 segment. Left vertebral artery appears mildly non dominant but stable  since 2023. Mild associated stenosis along its course to the skull base including in the V3 segment from soft and calcified plaque. CTA HEAD Posterior circulation: Distal vertebral arteries and vertebrobasilar junction, PICA origins remain patent with mild irregularity. Patent basilar artery. Mild basilar artery irregularity and mild mid basilar stenosis (series 15, image 21). Distal basilar, basilar tip, SCA and PCA origins remain patent. Left posterior communicating artery contributes to the PCA on that side, diminutive or absent right posterior communicating. Right PCA branches are patent with mild irregularity. On the left there is moderate short segment irregularity and stenosis of the P2 which is progressed since 2023 on series 11, image 23. Anterior circulation: Both ICA siphons are patent. On the left there is moderate to severe calcified plaque resulting in moderate distal cavernous stenosis. Left posterior communicating artery origin is normal. Right side similar calcified plaque, moderate distal cavernous and proximal supraclinoid stenosis. These appear stable since 2023. Patent carotid termini, MCA and ACA origins. Anterior communicating artery with median artery of the corpus callosum and bilateral ACA branches are within normal limits. MCA M1 segments and MCA trifurcations are patent without stenosis. Bilateral MCA branches are mildly irregular. Venous sinuses: Early contrast timing, not well evaluated. Anatomic variants: Mildly dominant right vertebral artery, fetal type left PCA origin. Review of the MIP images confirms the above findings IMPRESSION: 1. 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 Negative for large vessel occlusion. Suspected previous right carotid endarterectomy. 2. Stable atherosclerosis since the 2023 CTA except for Moderate stenosis left PCA P2 segment is new/increased. No severe stenosis is identified. Up to moderate stenosis also in both ICA siphons. 3.  Stable CT  appearance of chronic cerebral small vessel disease. 4. CABG.  Aortic Atherosclerosis (ICD10-I70.0). Electronically Signed   By: Odessa Fleming M.D.   On: 05/14/2023 16:55     Echo normal left ventricular function EF around 60%  TELEMETRY: Sinus tachycardia rate  of 105 nonspecific ST-T wave changes:  ASSESSMENT AND PLAN:  Principal Problem:   NSTEMI (non-ST elevated myocardial infarction) (HCC) Active Problems:   Hypertension associated with type 2 diabetes mellitus (HCC)   Type 2 diabetes mellitus with hyperglycemia, with long-term current use of insulin (HCC)   Inflammatory polyarthritis (HCC)   PAD (peripheral artery disease) (HCC)   AKI (acute kidney injury) (HCC)   History of CVA (cerebrovascular accident)    Plan CVA continue aspirin statin consider Plavix follow-up with neurology 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 Coronary bypass surgery x 4    2022  continue medical management Peripheral vascular disease continue aspirin statin therapy 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 continue current management Hypertension reasonably controlled would recommend start ACE ARB currently on amlodipine Hyperlipidemia continue Crestor therapy for lipid management ILR in place for evaluation of high-grade arrhythmias but no evidence of atrial fibrillation Elevated troponin patient should not a non-STEMI elevation probably related to CVA Recommend Plavix therapy long-term with dual antiplatelet therapy with aspirin because of peripheral disease as well as CVA Inflammatory polyarthritis with follow-up with rheumatology COPD by history patient quit smoking continue inhalers as necessary Peripheral vascular disease including previous intervention of SFA recommend aspirin Plavix statin walking exercises Have the patient follow-up with cardiology Dr. Darrold Junker 1 to 2 weeks after discharge     Alwyn Pea, MD 05/16/2023 3:39 PM

## 2023-05-16 NOTE — TOC Transition Note (Signed)
 Transition of Care [facility]) - Discharge Note   Patient Details  Name: Morgan Daniels MRN: [id number] Date of Birth: 24-Jun-1960  [facility] CM/SW Contact:  Bing Quarry, RN Phone Number: 05/16/2023, 1:31 PM   Clinical Narrative: 3/29: Patient has discharge to home with home health orders in. Patient declined home health at this time and was informed to contact PCP if need arises after discharge.    Gabriel Cirri MSN RN CM  RN Case Manager [place]  Transitions of Care Direct Dial: [phone] (Weekends Only) [facility] Main Office Phone: [phone] [facility] Fax: [phone] [email or web address]       Final next level of care:  (Declined home health post discharge.) Barriers to Discharge: Barriers Resolved   Patient Goals and CMS Choice            Discharge Placement                       Discharge Plan and Services Additional resources added to the After Visit Summary for                  DME Arranged: N/A DME Agency: NA         HH Agency: NA        Social Drivers of Health (SDOH) Interventions SDOH Screenings   Food Insecurity: No Food Insecurity (05/15/2023)  Housing: Low Risk  (05/15/2023)  Transportation Needs: No Transportation Needs (05/15/2023)  Utilities: Not At Risk (05/15/2023)  Alcohol Screen: Low Risk  (01/21/2023)  Depression (PHQ2-9): High Risk (01/21/2023)  Tobacco Use: Medium Risk (05/14/2023)     Readmission Risk Interventions     No data to display

## 2023-05-16 NOTE — Plan of Care (Signed)
  Problem: Activity: Goal: Ability to tolerate increased activity will improve Outcome: Progressing   Problem: Cardiac: Goal: Ability to achieve and maintain adequate cardiovascular perfusion will improve Outcome: Progressing   Problem: Coping: Goal: Ability to adjust to condition or change in health will improve Outcome: Progressing   Problem: Fluid Volume: Goal: Ability to maintain a balanced intake and output will improve Outcome: Progressing   Problem: Health Behavior/Discharge Planning: Goal: Ability to identify and utilize available resources and services will improve Outcome: Progressing Goal: Ability to manage health-related needs will improve Outcome: Progressing   Problem: Nutritional: Goal: Maintenance of adequate nutrition will improve Outcome: Progressing   Problem: Skin Integrity: Goal: Risk for impaired skin integrity will decrease Outcome: Progressing   Problem: Education: Goal: Knowledge of disease or condition will improve Outcome: Progressing Goal: Knowledge of patient specific risk factors will improve (DELETE if not current risk factor) Outcome: Progressing   Problem: Coping: Goal: Will verbalize positive feelings about self Outcome: Progressing Goal: Will identify appropriate support needs Outcome: Progressing   Problem: Health Behavior/Discharge Planning: Goal: Ability to manage health-related needs will improve Outcome: Progressing

## 2023-05-16 NOTE — Discharge Summary (Signed)
 Physician Discharge Summary   Patient: Morgan Daniels MRN: [id number] DOB: 1960-04-10  Admit date:     05/14/2023  Discharge date: {dischdate:26783}  Discharge Physician: Marcelino Duster   PCP: Sallyanne Kuster, NP   Recommendations at discharge:  {Tip this will not be part of the note when signed- Example include specific recommendations for outpatient follow-up, pending tests to follow-up on. (Optional):26781}  ***  Discharge Diagnoses: Principal Problem:   NSTEMI (non-ST elevated myocardial infarction) (HCC) Active Problems:   History of CVA (cerebrovascular accident)   AKI (acute kidney injury) (HCC)   Type 2 diabetes mellitus with hyperglycemia, with long-term current use of insulin (HCC)   PAD (peripheral artery disease) (HCC)   Hypertension associated with type 2 diabetes mellitus (HCC)   Inflammatory polyarthritis (HCC)  Resolved Problems:   * No resolved hospital problems. [facility] Course: No notes on file  Assessment and Plan: * NSTEMI (non-ST elevated myocardial infarction) [facility]) Patient is presenting with sudden onset chest pain earlier today accompanied by palpitations and diaphoresis.  Chest pain has resolved without recurrence but she continues to have palpitations on and off. Given patient's high risk history including extensive vascular disease with modest troponin leak, heparin infusion has been initiated.  EKG stable without acute ischemic changes.  - Cardiology consulted; appreciate their recommendations - Telemetry monitoring - Continue heparin infusion per pharmacy dosing - Continue home aspirin - Continue home statin - Nitroglycerin as needed for recurrent chest pain - Echocardiogram ordered  History of CVA (cerebrovascular accident) Patient reports nonspecific vision changes that are not quite consistent with TIA or CVA, however given extensive history of CVA, will obtain an MRI of the brain to rule out.  - Continue home aspirin -  Continue home statin  AKI (acute kidney injury) (HCC) Unclear etiology as patient does not report poor p.o. intake.  Per chart review, it seems she has frequent fluctuations of her creatinine up to the 1.2.  - 500 cc bolus - Recheck BMP in the a.m.  Type 2 diabetes mellitus with hyperglycemia, with long-term current use of insulin (HCC) Long-term history of uncontrolled type 2 diabetes with last A1c of 10.8% approximately 1 month ago.  - Hold home regimen - SSI, moderate - Semglee 20 units at bedtime  PAD (peripheral artery disease) (HCC) - Continue home regimen  Hypertension associated with type 2 diabetes mellitus (HCC) - Continue home amlodipine - Hold home losartan given elevated creatinine  Inflammatory polyarthritis (HCC) - Continue home tramadol.  PDMP reviewed and appropriate      {Tip this will not be part of the note when signed Body mass index is 28.16 kg/m. , ,  (Optional):26781}  {(NOTE) Pain control PDMP Statment (Optional):26782} Consultants: *** Procedures performed: ***  Disposition: {Plan; Disposition:26390} Diet recommendation:  Discharge Diet Orders (From admission, onward)     Start     Ordered   05/16/23 0000  Diet - low sodium heart healthy        05/16/23 1317   05/16/23 0000  Diet Carb Modified        05/16/23 1317           {Diet_Plan:26776} DISCHARGE MEDICATION: Allergies as of 05/16/2023       Reactions   Simvastatin Diarrhea, Nausea And Vomiting   Morphine And Codeine Itching   Lactose Intolerance (gi) Diarrhea   bloating   Latex Rash   Rash when she wears gloves (Negative by test - per pt)  Medication List     STOP taking these medications    amLODipine 10 MG tablet Commonly known as: NORVASC   gemfibrozil 600 MG tablet Commonly known as: LOPID   insulin aspart 100 UNIT/ML FlexTouch Pen Commonly known as: FIASP   nitroGLYCERIN 0.2 mg/hr patch Commonly known as: NITRODUR - Dosed in mg/24 hr   Praluent  150 MG/ML Soaj Generic drug: Alirocumab   tiZANidine 4 MG tablet Commonly known as: ZANAFLEX   Vitamin D-3 125 MCG (5000 UT) Tabs       TAKE these medications    Accu-Chek Guide test strip Generic drug: glucose blood Use 1 test strip to check glucose before meals, at bedtime and prn with glucose meter   Advocate Safety Lancets 26G Misc Use 1 safety lancet to check glucose 4 times daily  for diabetes E11.65   aspirin 81 MG chewable tablet Chew 1 tablet (81 mg total) by mouth daily for 21 days.   clopidogrel 75 MG tablet Commonly known as: PLAVIX Take 1 tablet (75 mg total) by mouth daily.   dicyclomine 10 MG capsule Commonly known as: BENTYL TAKE 1 CAPSULE BY MOUTH THREE TIMES DAILY BEFORE MEALS   empagliflozin 25 MG Tabs tablet Commonly known as: Jardiance Take 1 tablet (25 mg total) by mouth daily before breakfast.   escitalopram 10 MG tablet Commonly known as: LEXAPRO Take 10 mg by mouth daily.   fenofibrate 54 MG tablet Take 1 tablet (54 mg total) by mouth daily. Start taking on: May 17, 2023   gabapentin 800 MG tablet Commonly known as: NEURONTIN Take 1 tablet by mouth 3 (three) times daily. What changed: Another medication with the same name was removed. Continue taking this medication, and follow the directions you see here.   glipiZIDE 5 MG 24 hr tablet Commonly known as: GLUCOTROL XL Take 1 tablet (5 mg total) by mouth daily with breakfast.   isosorbide mononitrate 30 MG 24 hr tablet Commonly known as: IMDUR Take 1 tablet (30 mg total) by mouth daily. Start taking on: May 17, 2023   losartan 25 MG tablet Commonly known as: COZAAR Take 1 tablet (25 mg total) by mouth daily.   metoprolol succinate 50 MG 24 hr tablet Commonly known as: TOPROL-XL TAKE 1 TABLET BY MOUTH EVERY DAY   mirtazapine 15 MG tablet Commonly known as: REMERON TAKE ONE TABLET BY MOUTH AT BEDTIME What changed: how much to take   rosuvastatin 40 MG tablet Commonly  known as: CRESTOR TAKE 1 TABLET BY MOUTH EVERYDAY   traMADol 50 MG tablet Commonly known as: ULTRAM TAKE ONE TABLET BY MOUTH THREE TIMES DAILY AS NEEDED FOR MODERATE OR SEVERE PAIN   Tresiba FlexTouch 200 UNIT/ML FlexTouch Pen Generic drug: insulin degludec Inject 40 Units into the skin at bedtime. Increase by 4 units every 4 days if AM sugar >200.   UltiCare Micro Pen Needles 32G X 4 MM Misc Generic drug: Insulin Pen Needle TO USE WITH INSULIN DOSING THREE TIMES DAILY PRIOR TO MEALS AND AT BEDTIME FOR BASAL INSULIN.   Vascepa 1 g capsule Generic drug: icosapent Ethyl TAKE 1 CAPSULE BY MOUTH 2 TIMES DAILY        Follow-up Information     Paraschos, Alexander, MD. Go in 1 week(s).   Specialty: Cardiology Contact information: [address] [facility] [place] [address] [phone]                Discharge Exam: Ceasar Mons Weights   05/14/23 1730 05/14/23  1909 05/16/23 0531  Weight: 88.7 kg 88.7 kg 81.6 kg   ***  Condition at discharge: {DC Condition:26389}  The results of significant diagnostics from this hospitalization (including imaging, microbiology, ancillary and laboratory) are listed below for reference.   Imaging Studies: ECHOCARDIOGRAM COMPLETE Result Date: 05/15/2023    ECHOCARDIOGRAM REPORT   Patient Name:   Morgan Daniels Date of Exam: 05/15/2023 Medical Rec #:  [id number]      Height:       67.0 in Accession #:    [id number]     Weight:       195.6 lb Date of Birth:  Mar 18, 1960      BSA:          2.003 m Patient Age:    63 years       BP:           184/85 mmHg Patient Gender: F              HR:           80 bpm. Exam Location:  [facility] Procedure: 2D Echo, Cardiac Doppler, Color Doppler and Strain Analysis (Both            Spectral and Color Flow Doppler were utilized during procedure). Indications:     NSTEMI  History:         Patient has prior history of Echocardiogram examinations, most                  recent 01/13/2021. Acute MI  and CAD, Prior CABG, PAD and                  Stroke, Signs/Symptoms:Dizziness/Lightheadedness; Risk                  Factors:Hypertension, Diabetes and Dyslipidemia.  Sonographer:     Mikki Harbor Referring Phys:  [id number] Verdene Lennert Diagnosing Phys: Alwyn Pea MD  Sonographer Comments: Global longitudinal strain was attempted. IMPRESSIONS  1. Left ventricular ejection fraction, by estimation, is 65 to 70%. The left ventricle has normal function. The left ventricle has no regional wall motion abnormalities. There is mild concentric left ventricular hypertrophy. Left ventricular diastolic parameters are consistent with Grade I diastolic dysfunction (impaired relaxation). The average left ventricular global longitudinal strain is 11.0 %.  2. Right ventricular systolic function is normal. The right ventricular size is normal. There is normal pulmonary artery systolic pressure.  3. Posterior leaflet of the mitral valve leaflets.  4. The mitral valve is degenerative. Trivial mitral valve regurgitation.  5. The aortic valve is normal in structure. Aortic valve regurgitation is not visualized. FINDINGS  Left Ventricle: Left ventricular ejection fraction, by estimation, is 65 to 70%. The left ventricle has normal function. The left ventricle has no regional wall motion abnormalities. The average left ventricular global longitudinal strain is 11.0 %. Strain was performed and the global longitudinal strain is indeterminate. The left ventricular internal cavity size was normal in size. There is mild concentric left ventricular hypertrophy. Left ventricular diastolic parameters are consistent with Grade  I diastolic dysfunction (impaired relaxation). Right Ventricle: The right ventricular size is normal. No increase in right ventricular wall thickness. Right ventricular systolic function is normal. There is normal pulmonary artery systolic pressure. The tricuspid regurgitant velocity is 2.35 m/s, and  with an  assumed right atrial pressure of 3 mmHg, the estimated right ventricular systolic pressure is 25.1 mmHg. Left Atrium: Left atrial size was normal in size. Right Atrium: Right  atrial size was normal in size. Pericardium: There is no evidence of pericardial effusion. Mitral Valve: Posterior leaflet frozen thickened and calcified. The mitral valve is degenerative in appearance. There is moderate thickening of the posterior mitral valve leaflet(s). There is moderate calcification of the posterior mitral valve leaflet(s). Posterior leaflet of the mitral valve leaflets. Trivial mitral valve regurgitation. MV peak gradient, 4.8 mmHg. The mean mitral valve gradient is 2.0 mmHg. Tricuspid Valve: The tricuspid valve is normal in structure. Tricuspid valve regurgitation is trivial. Aortic Valve: The aortic valve is normal in structure. Aortic valve regurgitation is not visualized. Aortic valve mean gradient measures 4.0 mmHg. Aortic valve peak gradient measures 8.4 mmHg. Aortic valve area, by VTI measures 3.05 cm. Pulmonic Valve: The pulmonic valve was normal in structure. Pulmonic valve regurgitation is not visualized. Aorta: The ascending aorta was not well visualized. IAS/Shunts: No atrial level shunt detected by color flow Doppler. Additional Comments: 3D was performed not requiring image post processing on an independent workstation and was indeterminate.  LEFT VENTRICLE PLAX 2D LVIDd:         5.10 cm     Diastology LVIDs:         3.10 cm     LV e' medial:    4.90 cm/s LV PW:         1.20 cm     LV E/e' medial:  13.8 LV IVS:        1.20 cm     LV e' lateral:   10.90 cm/s LVOT diam:     2.10 cm     LV E/e' lateral: 6.2 LV SV:         74 LV SV Index:   37          2D Longitudinal Strain LVOT Area:     3.46 cm    2D Strain GLS (A4C):   9.8 %                            2D Strain GLS (A3C):   13.0 %                            2D Strain GLS (A2C):   10.0 % LV Volumes (MOD)           2D Strain GLS Avg:     11.0 % LV vol d, MOD  A2C: 76.3 ml LV vol d, MOD A4C: 79.0 ml LV vol s, MOD A2C: 24.6 ml LV vol s, MOD A4C: 37.1 ml LV SV MOD A2C:     51.7 ml LV SV MOD A4C:     79.0 ml LV SV MOD BP:      46.8 ml RIGHT VENTRICLE RV Basal diam:  3.95 cm RV Mid diam:    4.00 cm RV S prime:     10.30 cm/s LEFT ATRIUM             Index        RIGHT ATRIUM           Index LA diam:        4.60 cm 2.30 cm/m   RA Area:     21.90 cm LA Vol (A2C):   64.3 ml 32.10 ml/m  RA Volume:   65.60 ml  32.75 ml/m LA Vol (A4C):   52.8 ml 26.36 ml/m LA Biplane Vol: 61.8 ml 30.85 ml/m  AORTIC VALVE  PULMONIC VALVE AV Area (Vmax):    2.72 cm     PV Vmax:       1.10 m/s AV Area (Vmean):   2.80 cm     PV Peak grad:  4.8 mmHg AV Area (VTI):     3.05 cm AV Vmax:           145.00 cm/s AV Vmean:          85.800 cm/s AV VTI:            0.243 m AV Peak Grad:      8.4 mmHg AV Mean Grad:      4.0 mmHg LVOT Vmax:         114.00 cm/s LVOT Vmean:        69.300 cm/s LVOT VTI:          0.214 m LVOT/AV VTI ratio: 0.88  AORTA Ao Root diam: 3.50 cm Ao Asc diam:  3.60 cm MITRAL VALVE                TRICUSPID VALVE MV Area (PHT): 2.23 cm     TR Peak grad:   22.1 mmHg MV Area VTI:   2.44 cm     TR Vmax:        235.00 cm/s MV Peak grad:  4.8 mmHg MV Mean grad:  2.0 mmHg     SHUNTS MV Vmax:       1.09 m/s     Systemic VTI:  0.21 m MV Vmean:      66.8 cm/s    Systemic Diam: 2.10 cm MV Decel Time: 340 msec MV E velocity: 67.60 cm/s MV A velocity: 107.00 cm/s MV E/A ratio:  0.63 Dwayne D Callwood MD Electronically signed by Alwyn Pea MD Signature Date/Time: 05/15/2023/5:42:25 PM    Final    MR BRAIN WO CONTRAST Result Date: 05/14/2023 CLINICAL DATA:  Neuro deficit, concern for stroke. Palpitations, diaphoresis, chest pain, feeling unwell. EXAM: MRI HEAD WITHOUT CONTRAST TECHNIQUE: Multiplanar, multiecho pulse sequences of the brain and surrounding structures were obtained without intravenous contrast. COMPARISON:  CTA head and neck earlier same day. FINDINGS:  Brain: There is a 12 x 6 mm focus of restricted diffusion in the left temporal periventricular white matter likely involving the tail of the caudate nucleus and partially extending into the temporal stem white matter. No evidence of intracranial hemorrhage. Scattered and confluent FLAIR signal abnormality in the periventricular and subcortical white matter. Remote lacunar infarcts in the bilateral basal ganglia and in the right corona radiata and bilateral centrum semiovale. No mass lesion or midline shift. Cerebellum is unremarkable. Normal appearance of midline structures. The basilar cisterns are patent. No extra-axial fluid collections. Ventricles: Prominence of the lateral ventricles suggestive of underlying parenchymal volume loss. Vascular: Skull base flow voids are visualized. Skull and upper cervical spine: No focal abnormality. Sinuses/Orbits: Orbits are symmetric. Paranasal sinuses are clear. Other: Mastoid air cells are clear. IMPRESSION: Acute infarct in the left temporal periventricular white matter involving the tail of the caudate nucleus. Moderate chronic microvascular ischemic changes and mild to moderate parenchymal volume loss. Multiple remote infarcts as above. Electronically Signed   By: Emily Filbert M.D.   On: 05/14/2023 23:59   CT ANGIO HEAD NECK W WO CM Result Date: 05/14/2023 CLINICAL DATA:  63 year old female with chest pain and palpitations, feeling unwell. Vision changes, feels like she is leaning to the left. EXAM: CT ANGIOGRAPHY HEAD AND NECK WITH AND WITHOUT CONTRAST TECHNIQUE: Multidetector CT  imaging of the head and neck was performed using the standard protocol during bolus administration of intravenous contrast. Multiplanar CT image reconstructions and MIPs were obtained to evaluate the vascular anatomy. Carotid stenosis measurements (when applicable) are obtained utilizing NASCET criteria, using the distal internal carotid diameter as the denominator. RADIATION DOSE  REDUCTION: This exam was performed according to the departmental dose-optimization program which includes automated exposure control, adjustment of the mA and/or kV according to patient size and/or use of iterative reconstruction technique. CONTRAST:  75mL OMNIPAQUE IOHEXOL 350 MG/ML SOLN COMPARISON:  Brain MRI 09/18/2021. Head CT 02/01/2022. CTA head and neck 09/18/2021. FINDINGS: CT HEAD Brain: Chronic small vessel disease. Patchy and confluent bilateral cerebral white matter hypodensity with chronic lacunar infarcts in the bilateral corona radiata, deep gray nuclei. Stable non contrast CT appearance of the brain. Since 2023. No midline shift, ventriculomegaly, mass effect, evidence of mass lesion, intracranial hemorrhage or evidence of cortically based acute infarction. Calvarium and skull base: Stable, intact. Paranasal sinuses: Visualized paranasal sinuses and mastoids are stable and well aerated. Orbits: Visualized orbits and scalp soft tissues are within normal limits. CTA NECK Skeleton: Chronic sternotomy. Mild for age cervical spine degeneration. No acute osseous abnormality identified. Upper chest: CABG.  Otherwise negative. Other neck: Neck soft tissue spaces are stable since 2023 and within normal limits. Aortic arch: Extensive Calcified aortic atherosclerosis. Three vessel arch. Right carotid system: Brachiocephalic artery and right CCA plaque without stenosis. Capacious right carotid bifurcation and regional surgical clips suggesting chronic endarterectomy. Residual soft and calcified plaque along the posterior and lateral bifurcation is stable without stenosis. Mostly calcified plaque beginning distal to the right ICA bulb and continuing to the skull base is stable without stenosis. Left carotid system: Extensive left CCA origin plaque appears stable without stenosis. Left CCA plaque continues to the left carotid bifurcation. Chronic low-density plaque along the medial vessel just proximal to the  bifurcation on series 6, image 115. More calcified plaque at the bifurcation. But no associated stenosis. Combined soft and calcified plaque along the medial and posterior vessel just below the skull base is stable and without stenosis (series 6, image 78). Vertebral arteries: Right subclavian origin plaque without stenosis. Right vertebral artery remains patent. With intermittent atherosclerosis to the skull base. Only mild stenosis results. Proximal left subclavian atherosclerosis without stenosis. Left vertebral artery origin is patent. Calcified plaque beginning in the left V1 segment. Left vertebral artery appears mildly non dominant but stable since 2023. Mild associated stenosis along its course to the skull base including in the V3 segment from soft and calcified plaque. CTA HEAD Posterior circulation: Distal vertebral arteries and vertebrobasilar junction, PICA origins remain patent with mild irregularity. Patent basilar artery. Mild basilar artery irregularity and mild mid basilar stenosis (series 15, image 21). Distal basilar, basilar tip, SCA and PCA origins remain patent. Left posterior communicating artery contributes to the PCA on that side, diminutive or absent right posterior communicating. Right PCA branches are patent with mild irregularity. On the left there is moderate short segment irregularity and stenosis of the P2 which is progressed since 2023 on series 11, image 23. Anterior circulation: Both ICA siphons are patent. On the left there is moderate to severe calcified plaque resulting in moderate distal cavernous stenosis. Left posterior communicating artery origin is normal. Right side similar calcified plaque, moderate distal cavernous and proximal supraclinoid stenosis. These appear stable since 2023. Patent carotid termini, MCA and ACA origins. Anterior communicating artery with median artery of the corpus callosum and  bilateral ACA branches are within normal limits. MCA M1 segments and  MCA trifurcations are patent without stenosis. Bilateral MCA branches are mildly irregular. Venous sinuses: Early contrast timing, not well evaluated. Anatomic variants: Mildly dominant right vertebral artery, fetal type left PCA origin. Review of the MIP images confirms the above findings IMPRESSION: 1. 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 Negative for large vessel occlusion. Suspected previous right carotid endarterectomy. 2. Stable atherosclerosis since the 2023 CTA except for Moderate stenosis left PCA P2 segment is new/increased. No severe stenosis is identified. Up to moderate stenosis also in both ICA siphons. 3.  Stable CT appearance of chronic cerebral small vessel disease. 4. CABG.  Aortic Atherosclerosis (ICD10-I70.0). Electronically Signed   By: Odessa Fleming M.D.   On: 05/14/2023 16:55   DG Chest 2 View Result Date: 05/14/2023 CLINICAL DATA:  Chest pain and palpitations. EXAM: CHEST - 2 VIEW COMPARISON:  02/01/2022. FINDINGS: Bilateral lung fields are clear. Bilateral costophrenic angles are clear. Normal cardio-mediastinal silhouette. There are surgical staples along the heart border and sternotomy wires, status post CABG (coronary artery bypass graft). Loop recorder device noted overlying the left lower anterior chest wall. No acute osseous abnormalities. The soft tissues are within normal limits. IMPRESSION: No active cardiopulmonary disease. Electronically Signed   By: Jules Schick M.D.   On: 05/14/2023 15:18    Microbiology: Results for orders placed or performed in visit on 07/24/22  CULTURE, URINE COMPREHENSIVE     Status: Abnormal   Collection Time: 07/24/22  3:37 PM   Specimen: Urine   Urine  Result Value Ref Range Status   Urine Culture, Comprehensive Final report (A)  Final   Organism ID, Bacteria Escherichia coli (A)  Final    Comment: Cefazolin <=4 ug/mL Cefazolin with an MIC <=16 predicts susceptibility to the oral agents cefaclor, cefdinir, cefpodoxime,  cefprozil, cefuroxime, cephalexin, and loracarbef when used for therapy of uncomplicated urinary tract infections due to E. coli, Klebsiella pneumoniae, and Proteus mirabilis. Greater than 100,000 colony forming units per mL    ANTIMICROBIAL SUSCEPTIBILITY Comment  Final    Comment:       ** S = Susceptible; I = Intermediate; R = Resistant **                    P = Positive; N = Negative             MICS are expressed in micrograms per mL    Antibiotic                 RSLT#1    RSLT#2    RSLT#3    RSLT#4 Amoxicillin/Clavulanic Acid    S Ampicillin                     S Cefepime                       S Ceftriaxone                    S Cefuroxime                     S Ciprofloxacin                  S Ertapenem                      S Gentamicin  S Imipenem                       S Levofloxacin                   S Meropenem                      S Nitrofurantoin                 S Piperacillin/Tazobactam        S Tetracycline                   S Tobramycin                     S Trimethoprim/Sulfa             S     Labs: CBC: Recent Labs  Lab 05/14/23 1425 05/15/23 0511 05/16/23 0649  WBC 8.2 7.8 7.1  HGB 13.2 12.8 12.8  HCT 39.9 38.9 38.7  MCV 79.6* 80.4 78.7*  PLT 379 357 357   Basic Metabolic Panel: Recent Labs  Lab 05/14/23 1425 05/15/23 0511 05/16/23 0649  NA 139 140 137  K 3.8 3.5 3.4*  CL 104 105 103  CO2 24 24 24   GLUCOSE 210* 155* 182*  BUN 27* 26* 24*  CREATININE 1.12* 0.83 0.96  CALCIUM 9.7 9.5 9.3  MG 2.1  --  2.2  PHOS  --   --  4.8*   Liver Function Tests: No results for input(s): "AST", "ALT", "ALKPHOS", "BILITOT", "PROT", "ALBUMIN" in the last 168 hours. CBG: Recent Labs  Lab 05/15/23 1122 05/15/23 1617 05/15/23 2133 05/16/23 0729 05/16/23 1222  GLUCAP 281* 186* 295* 185* 251*    Discharge time spent: {LESS THAN/GREATER THAN:26388} 30 minutes.  Signed: Marcelino Duster, MD [facility] 05/16/2023

## 2023-05-19 ENCOUNTER — Telehealth: Payer: Self-pay | Admitting: Nurse Practitioner

## 2023-05-19 NOTE — Telephone Encounter (Signed)
Lvm to schedule hospital follow up-Toni ?

## 2023-05-28 ENCOUNTER — Encounter: Payer: Self-pay | Admitting: Nurse Practitioner

## 2023-05-28 ENCOUNTER — Telehealth (INDEPENDENT_AMBULATORY_CARE_PROVIDER_SITE_OTHER): Admitting: Nurse Practitioner

## 2023-05-28 VITALS — Resp 16 | Ht 67.0 in | Wt 183.0 lb

## 2023-05-28 DIAGNOSIS — I214 Non-ST elevation (NSTEMI) myocardial infarction: Secondary | ICD-10-CM

## 2023-05-28 DIAGNOSIS — E785 Hyperlipidemia, unspecified: Secondary | ICD-10-CM

## 2023-05-28 DIAGNOSIS — Z794 Long term (current) use of insulin: Secondary | ICD-10-CM

## 2023-05-28 DIAGNOSIS — E1159 Type 2 diabetes mellitus with other circulatory complications: Secondary | ICD-10-CM | POA: Diagnosis not present

## 2023-05-28 DIAGNOSIS — Z8673 Personal history of transient ischemic attack (TIA), and cerebral infarction without residual deficits: Secondary | ICD-10-CM

## 2023-05-28 DIAGNOSIS — H539 Unspecified visual disturbance: Secondary | ICD-10-CM | POA: Insufficient documentation

## 2023-05-28 DIAGNOSIS — Z09 Encounter for follow-up examination after completed treatment for conditions other than malignant neoplasm: Secondary | ICD-10-CM

## 2023-05-28 DIAGNOSIS — E1142 Type 2 diabetes mellitus with diabetic polyneuropathy: Secondary | ICD-10-CM | POA: Diagnosis not present

## 2023-05-28 DIAGNOSIS — I152 Hypertension secondary to endocrine disorders: Secondary | ICD-10-CM

## 2023-05-28 DIAGNOSIS — E1169 Type 2 diabetes mellitus with other specified complication: Secondary | ICD-10-CM | POA: Diagnosis not present

## 2023-05-28 NOTE — Progress Notes (Addendum)
 [facility] [place], [place] [address] [place] [address] [id number]                                   Transitional Care Clinic   [facility] Discharge Acute Issues Care Follow Up                                                                        Patient Demographics  Morgan Daniels, is a 63 y.o. female  DOB 12-Nov-1960  MRN [id number].  Primary MD  Sallyanne Kuster, NP  I connected with the patient at 1320 by telephone and verified the patients identity using two identifiers.   I discussed the limitations, risks, security and privacy concerns of performing an evaluation and management service by telephone and the availability of in person appointments. I also discussed with the patient that there may be a patient responsible charge related to the service.  The patient expressed understanding and agrees to proceed.    Admit date: 05/14/23 Discharge date: 05/16/23  Reason for TCC follow Up - acute stroke and NSTEMI   Past Medical History:  Diagnosis Date   Anxiety    Arthritis    Bilateral carotid artery stenosis 2014   Carotid artery occlusion    Chronic kidney disease 06/18/2015   UTI   Chronic kidney disease 2017   Current smoker    CVA (cerebral vascular accident) (HCC) 2013   Depression    Diabetes (HCC)    Diverticulosis    Fatty liver    Fibromyalgia    GERD (gastroesophageal reflux disease)    H/O hiatal hernia    Heart attack (HCC) 01/12/2021   Hiatal hernia    Hypercholesteremia    Hypertension    IBS (irritable bowel syndrome)    PAD (peripheral artery disease) (HCC)    Peptic ulcer    Plantar fasciitis    Stroke (HCC) 01/31/2012   Right side-ministroke   T2DM (type 2 diabetes mellitus) (HCC)     Past Surgical History:  Procedure Laterality Date   ABDOMINAL HYSTERECTOMY  02/18/1988   BACK SURGERY  2000, 2004   CAROTID ENDARTERECTOMY Left 05/06/2012   CARPAL TUNNEL RELEASE Left 07/18/2015   Procedure: CARPAL  TUNNEL RELEASE;  Surgeon: Deeann Saint, MD;  Location: [facility] ORS;  Service: Orthopedics;  Laterality: Left;   CEREBRAL ANGIOGRAM Bilateral 05/03/2012   Procedure: CEREBRAL ANGIOGRAM;  Surgeon: Chuck Hint, MD;  Location: [facility] CATH LAB;  Service: Cardiovascular;  Laterality: Bilateral;   CHOLECYSTECTOMY  02/18/1999   COLONOSCOPY WITH PROPOFOL N/A 11/19/2018   Procedure: COLONOSCOPY WITH PROPOFOL;  Surgeon: Midge Minium, MD;  Location: [facility] SURGERY CNTR;  Service: Endoscopy;  Laterality: N/A;  Diabetic - insulin   CORONARY ARTERY BYPASS GRAFT N/A 01/18/2021   Procedure: CORONARY ARTERY BYPASS GRAFTING (CABG) x 4  USING LEFT INTERNAL MAMMARY ARTERY AND LEFT ENDOSCOPIC GREATER SAPHENOUS VEIN CONDUITS;  Surgeon: Loreli Slot, MD;  Location: [facility] OR;  Service: Open Heart Surgery;  Laterality: N/A;   CORONARY/GRAFT ACUTE MI REVASCULARIZATION N/A 01/13/2021   Procedure: Coronary/Graft Acute MI Revascularization;  Surgeon: Marcina Millard, MD;  Location: [facility] INVASIVE CV LAB;  Service: Cardiovascular;  Laterality: N/A;   ENDARTERECTOMY Left 05/06/2012   Procedure: ENDARTERECTOMY CAROTID;  Surgeon: Chuck Hint, MD;  Location: [facility] OR;  Service: Vascular;  Laterality: Left;   ENDARTERECTOMY Right 08/09/2013   Procedure: ENDARTERECTOMY CAROTID-RIGHT;  Surgeon: Chuck Hint, MD;  Location: [facility] OR;  Service: Vascular;  Laterality: Right;   ENDOVEIN HARVEST OF GREATER SAPHENOUS VEIN Left 01/18/2021   Procedure: ENDOVEIN HARVEST OF GREATER SAPHENOUS VEIN;  Surgeon: Loreli Slot, MD;  Location: [facility] OR;  Service: Open Heart Surgery;  Laterality: Left;   HERNIA REPAIR     IABP INSERTION Right 01/13/2021   Procedure: IABP Insertion;  Surgeon: Marcina Millard, MD;  Location: [facility] INVASIVE CV LAB;  Service: Cardiovascular;  Laterality: Right;   LEFT HEART CATH AND CORONARY ANGIOGRAPHY N/A 01/13/2021   Procedure: LEFT HEART CATH AND CORONARY ANGIOGRAPHY;  Surgeon:  Marcina Millard, MD;  Location: [facility] INVASIVE CV LAB;  Service: Cardiovascular;  Laterality: N/A;   LOWER EXTREMITY ANGIOGRAPHY Left 01/03/2021   Procedure: LOWER EXTREMITY ANGIOGRAPHY;  Surgeon: Annice Needy, MD;  Location: [facility] INVASIVE CV LAB;  Service: Cardiovascular;  Laterality: Left;   LOWER EXTREMITY ANGIOGRAPHY Left 02/28/2021   Procedure: LOWER EXTREMITY ANGIOGRAPHY;  Surgeon: Annice Needy, MD;  Location: [facility] INVASIVE CV LAB;  Service: Cardiovascular;  Laterality: Left;   PATCH ANGIOPLASTY Left 05/06/2012   Procedure: WITH DACRON PATCH ANGIOPLASTY ;  Surgeon: Chuck Hint, MD;  Location: [facility] OR;  Service: Vascular;  Laterality: Left;   POLYPECTOMY  11/19/2018   Procedure: POLYPECTOMY;  Surgeon: Midge Minium, MD;  Location: [facility] SURGERY CNTR;  Service: Endoscopy;;   SPINE SURGERY  02/17/2002   TEE WITHOUT CARDIOVERSION N/A 01/18/2021   Procedure: TRANSESOPHAGEAL ECHOCARDIOGRAM (TEE);  Surgeon: Loreli Slot, MD;  Location: [facility] OR;  Service: Open Heart Surgery;  Laterality: N/A;   TONSILLECTOMY     TUBAL LIGATION         Recent HPI and Hospital Course  Hospital Course: Morgan Daniels is a 63 y.o. female with medical history significant of uncontrolled type 2 diabetes, hypertension, hyperlipidemia, chronic pain on opioids, CVA, CAD s/p CABG (2022), COPD, PAD s/p left SFA stent, who presents to the ED due to palpitations. She had gradual onset vision changes bilaterally that she describes as blurry spotty vision. She began to experience sudden onset substernal chest pain without radiation that was accompanied by palpitations and diaphoresis.  Patient is admitted to hospitalist service with impression of NSTEMI, acute CVA as MRI brain showed acute infarct in left temporal periventricular white matter involving the tail of the caudate nucleus.   Assessment and Plan: NSTEMI (non-ST elevated myocardial infarction) Presented with chest pain which has been resolved,  had palpitations off and on. Cardiology evaluation appreciated -chest pain atypical, associated with episode of blurry vision, EKG sinus without ischemic changes troponin mildly elevated.  This is most likely type II MI in the setting of acute CVA. Echocardiogram did not show any wall motion abnormalities. Heparin drip stopped. Continue aspirin, Plavix, statin and nitroglycerin as needed   Acute CVA Pt has h/o prior CVA  MRI brain: Acute infarct in the left temporal periventricular white matter involving the tail of the caudate nucleus. Moderate chronic microvascular ischemic changes and mild to moderate parenchymal volume loss. Multiple remote infarcts. CTA negative for LVO Continue neurocheck as per protocol Continue fall precautions Triglyceride 482, elevated, could not count LDL Hemoglobin A1c 10.8, elevated Check TSH level Neurology recommended, ASA 81mg  daily + plavix  75mg  daily x21 days f/b plavix 75mg  daily monotherapy after that (stroke represents aspirin failure) Continue rosuvastatin 40mg  daily and fenofibrate 54mg  daily Recommend close f/u with her established outpatient neurologist Dr. Malvin Johns. PT OT recommended home health services which are set up.   AKI (acute kidney injury) (HCC) Resolved with IV fluids.   Type 2 diabetes mellitus with hyperglycemia, with long-term current use of insulin (HCC) Long-term history of uncontrolled type 2 diabetes with last A1c of 10.8% approximately 1 month ago. Patient's home regimen resumed, advised compliance with medications and follow-up with PCP.   PAD (peripheral artery disease) (HCC) Continue home regimen   Hypertension associated with type 2 diabetes mellitus (HCC) Patient's home blood pressure medications losartan, amlodipine resumed   Hypertriglyceridemia Continue Crestor 40 mg, fenofibrate p.o. daily.  Gemfibrozil stopped   Inflammatory polyarthritis (HCC) Continue home tramadol.       Post Hospital Acute Care Issue  to be followed in the Clinic    NSTEMI (non-ST elevated myocardial infarction) [facility]) Active Problems:   History of CVA (cerebrovascular accident)   AKI (acute kidney injury) (HCC)   Type 2 diabetes mellitus with hyperglycemia, with long-term current use of insulin (HCC)   PAD (peripheral artery disease) (HCC)   Hypertension associated with type 2 diabetes mellitus (HCC)   Inflammatory polyarthritis (HCC   Subjective:   Morgan Daniels today has, No headache, No chest pain, No abdominal pain - No Nausea, No new weakness tingling or numbness, No Cough - SOB. Partial loss of field of vision on the left side.   Assessment & Plan   1. Hospital discharge follow-up (Primary) ***  2. Visual disturbance due to recent stroke ***  3. NSTEMI (non-ST elevated myocardial infarction) (HCC) ***  4. Type 2 diabetes mellitus with diabetic polyneuropathy, with long-term current use of insulin (HCC) ***  5. Hypertension associated with type 2 diabetes mellitus (HCC) ***  6. Hyperlipidemia associated with type 2 diabetes mellitus (HCC) ***  7. History of CVA (cerebrovascular accident) ***   Reason for frequent admissions/ER visits    increased risk of subsequent stroke or MI Diabetes Hypertension    Objective:   Vitals:   05/28/23 1317  Resp: 16  Weight: 183 lb (83 kg)  Height: 5\' 7"  (1.702 m)    Wt Readings from Last 3 Encounters:  05/28/23 183 lb (83 kg)  05/16/23 179 lb 12.8 oz (81.6 kg)  04/14/23 182 lb 12.8 oz (82.9 kg)    Allergies as of 05/28/2023       Reactions   Simvastatin Diarrhea, Nausea And Vomiting   Morphine And Codeine Itching   Lactose Intolerance (gi) Diarrhea   bloating   Latex Rash   Rash when she wears gloves (Negative by test - per pt)        Medication List        Accurate as of May 28, 2023  5:06 PM. If you have any questions, ask your nurse or doctor.          Accu-Chek Guide test strip Generic drug: glucose blood Use 1 test  strip to check glucose before meals, at bedtime and prn with glucose meter   Advocate Safety Lancets 26G Misc Use 1 safety lancet to check glucose 4 times daily  for diabetes E11.65   aspirin 81 MG chewable tablet Chew 1 tablet (81 mg total) by mouth daily for 21 days.   clopidogrel 75 MG tablet Commonly known as: PLAVIX Take 1 tablet (75 mg total) by mouth  daily.   dicyclomine 10 MG capsule Commonly known as: BENTYL TAKE 1 CAPSULE BY MOUTH THREE TIMES DAILY BEFORE MEALS   empagliflozin 25 MG Tabs tablet Commonly known as: Jardiance Take 1 tablet (25 mg total) by mouth daily before breakfast.   escitalopram 10 MG tablet Commonly known as: LEXAPRO Take 10 mg by mouth daily.   fenofibrate 54 MG tablet Take 1 tablet (54 mg total) by mouth daily.   gabapentin 800 MG tablet Commonly known as: NEURONTIN Take 1 tablet by mouth 3 (three) times daily.   glipiZIDE 5 MG 24 hr tablet Commonly known as: GLUCOTROL XL Take 1 tablet (5 mg total) by mouth daily with breakfast.   isosorbide mononitrate 30 MG 24 hr tablet Commonly known as: IMDUR Take 1 tablet (30 mg total) by mouth daily.   losartan 25 MG tablet Commonly known as: COZAAR Take 1 tablet (25 mg total) by mouth daily.   metoprolol succinate 50 MG 24 hr tablet Commonly known as: TOPROL-XL TAKE 1 TABLET BY MOUTH EVERY DAY   mirtazapine 15 MG tablet Commonly known as: REMERON TAKE ONE TABLET BY MOUTH AT BEDTIME What changed: how much to take   rosuvastatin 40 MG tablet Commonly known as: CRESTOR TAKE 1 TABLET BY MOUTH EVERYDAY   traMADol 50 MG tablet Commonly known as: ULTRAM TAKE ONE TABLET BY MOUTH THREE TIMES DAILY AS NEEDED FOR MODERATE OR SEVERE PAIN   Tresiba FlexTouch 200 UNIT/ML FlexTouch Pen Generic drug: insulin degludec Inject 40 Units into the skin at bedtime. Increase by 4 units every 4 days if AM sugar >200.   UltiCare Micro Pen Needles 32G X 4 MM Misc Generic drug: Insulin Pen Needle TO USE  WITH INSULIN DOSING THREE TIMES DAILY PRIOR TO MEALS AND AT BEDTIME FOR BASAL INSULIN.   Vascepa 1 g capsule Generic drug: icosapent Ethyl TAKE 1 CAPSULE BY MOUTH 2 TIMES DAILY         Objective data: She is alert and oriented. No acute distress noted.   Data Review   Micro Results No results found for this or any previous visit (from the past 240 hours).   CBC No results for input(s): "WBC", "HGB", "HCT", "PLT", "MCV", "MCH", "MCHC", "RDW", "LYMPHSABS", "MONOABS", "EOSABS", "BASOSABS", "BANDABS" in the last 168 hours.  Invalid input(s): "NEUTRABS", "BANDSABD"  Chemistries  No results for input(s): "NA", "K", "CL", "CO2", "GLUCOSE", "BUN", "CREATININE", "CALCIUM", "MG", "AST", "ALT", "ALKPHOS", "BILITOT" in the last 168 hours.  Invalid input(s): "GFRCGP" ------------------------------------------------------------------------------------------------------------------ estimated creatinine clearance is 67.3 mL/min (by C-G formula based on SCr of 0.96 mg/dL). ------------------------------------------------------------------------------------------------------------------ No results for input(s): "HGBA1C" in the last 72 hours. ------------------------------------------------------------------------------------------------------------------ No results for input(s): "CHOL", "HDL", "LDLCALC", "TRIG", "CHOLHDL", "LDLDIRECT" in the last 72 hours. ------------------------------------------------------------------------------------------------------------------ No results for input(s): "TSH", "T4TOTAL", "T3FREE", "THYROIDAB" in the last 72 hours.  Invalid input(s): "FREET3" ------------------------------------------------------------------------------------------------------------------ No results for input(s): "VITAMINB12", "FOLATE", "FERRITIN", "TIBC", "IRON", "RETICCTPCT" in the last 72 hours.  Coagulation profile No results for input(s): "INR", "PROTIME" in the last 168  hours.  No results for input(s): "DDIMER" in the last 72 hours.  Cardiac Enzymes No results for input(s): "CKMB", "TROPONINI", "MYOGLOBIN" in the last 168 hours.  Invalid input(s): "CK" ------------------------------------------------------------------------------------------------------------------ Invalid input(s): "POCBNP"  Return for previously scheduled, F/U, Morgan Daniels PCP in may.   Time Spent in minutes  45 Time spent with patient included reviewing progress notes, labs, imaging studies, and discussing plan for follow up.   This patient was seen by Sallyanne Kuster, FNP-C in collaboration with Dr.  Beverely Risen as a part of collaborative care agreement.   Sallyanne Kuster MSN, FNP-C on 05/28/2023 at 1:18 PM   **Disclaimer: This note may have been dictated with voice recognition software. Similar sounding words can inadvertently be transcribed and this note may contain transcription errors which may not have been corrected upon publication of note.**

## 2023-05-29 ENCOUNTER — Encounter: Payer: Self-pay | Admitting: Nurse Practitioner

## 2023-06-04 ENCOUNTER — Other Ambulatory Visit: Payer: Self-pay | Admitting: Nurse Practitioner

## 2023-06-04 DIAGNOSIS — E1169 Type 2 diabetes mellitus with other specified complication: Secondary | ICD-10-CM

## 2023-06-18 ENCOUNTER — Ambulatory Visit: Admitting: Psychiatry

## 2023-07-08 ENCOUNTER — Encounter: Payer: Self-pay | Admitting: Nurse Practitioner

## 2023-07-08 ENCOUNTER — Ambulatory Visit (INDEPENDENT_AMBULATORY_CARE_PROVIDER_SITE_OTHER): Payer: Medicare HMO | Admitting: Nurse Practitioner

## 2023-07-08 VITALS — BP 135/80 | HR 99 | Temp 98.0°F | Resp 16 | Ht 67.0 in | Wt 178.0 lb

## 2023-07-08 DIAGNOSIS — Z794 Long term (current) use of insulin: Secondary | ICD-10-CM

## 2023-07-08 DIAGNOSIS — E1169 Type 2 diabetes mellitus with other specified complication: Secondary | ICD-10-CM | POA: Diagnosis not present

## 2023-07-08 DIAGNOSIS — E1142 Type 2 diabetes mellitus with diabetic polyneuropathy: Secondary | ICD-10-CM | POA: Diagnosis not present

## 2023-07-08 DIAGNOSIS — M79672 Pain in left foot: Secondary | ICD-10-CM

## 2023-07-08 DIAGNOSIS — E1159 Type 2 diabetes mellitus with other circulatory complications: Secondary | ICD-10-CM | POA: Diagnosis not present

## 2023-07-08 DIAGNOSIS — M79671 Pain in right foot: Secondary | ICD-10-CM | POA: Diagnosis not present

## 2023-07-08 DIAGNOSIS — E785 Hyperlipidemia, unspecified: Secondary | ICD-10-CM

## 2023-07-08 DIAGNOSIS — I152 Hypertension secondary to endocrine disorders: Secondary | ICD-10-CM

## 2023-07-08 DIAGNOSIS — I10 Essential (primary) hypertension: Secondary | ICD-10-CM

## 2023-07-08 MED ORDER — METOPROLOL SUCCINATE ER 50 MG PO TB24: 50.0000 mg | ORAL_TABLET | Freq: Every day | ORAL | 3 refills | Status: DC

## 2023-07-08 MED ORDER — TRAMADOL HCL 50 MG PO TABS
ORAL_TABLET | ORAL | 2 refills | Status: DC
Start: 2023-07-08 — End: 2023-10-22

## 2023-07-08 NOTE — Progress Notes (Signed)
 [facility] [address] [place], [place] [address]  Internal MEDICINE  Office Visit Note  Patient Name: Morgan Daniels  [id number]  [id number]  Date of Service: 07/08/2023  Chief Complaint  Patient presents with   Depression   Diabetes   Gastroesophageal Reflux   Hyperlipidemia   Hypertension   Follow-up    HPI Morgan Daniels presents for a follow-up visit for diabetes, hypertension, high cholesterol, and heel pain.  Diabetes -- taking jardiance , glipizide , and tresiba . Not checking glucose due to needing lancets. Never received the pressure activated lancets so found an alternative on [employer] Hypertension -- controlled with losartan  and metoprolol .  High cholesterol -- taking rosuvastatin , fenofibrate  and vascepa .  Bilateral heel pain --takes tramadol  as needed     Current Medication: Outpatient Encounter Medications as of 07/08/2023  Medication Sig   [employer]ocate Safety Lancets 26G MISC Use 1 safety lancet to check glucose 4 times daily  for diabetes E11.65   clopidogrel  (PLAVIX ) 75 MG tablet Take 1 tablet (75 mg total) by mouth daily.   dicyclomine  (BENTYL ) 10 MG capsule TAKE 1 CAPSULE BY MOUTH THREE TIMES DAILY BEFORE MEALS   escitalopram  (LEXAPRO ) 10 MG tablet Take 10 mg by mouth daily.   fenofibrate  54 MG tablet Take 1 tablet (54 mg total) by mouth daily.   gabapentin  (NEURONTIN ) 800 MG tablet Take 1 tablet by mouth 3 (three) times daily.   glipiZIDE  (GLUCOTROL  XL) 5 MG 24 hr tablet Take 1 tablet (5 mg total) by mouth daily with breakfast.   glucose blood (ACCU-CHEK GUIDE) test strip Use 1 test strip to check glucose before meals, at bedtime and prn with glucose meter   insulin  degludec (TRESIBA  FLEXTOUCH) 200 UNIT/ML FlexTouch Pen Inject 40 Units into the skin at bedtime. Increase by 4 units every 4 days if AM sugar >200.   isosorbide  mononitrate (IMDUR ) 30 MG 24 hr tablet Take 1 tablet (30 mg total) by mouth daily.   JARDIANCE  25 MG TABS tablet TAKE 1 TABLET EVERY DAY  BEFORE BREAKFAST. DISCONTINUE FARXIGA    losartan  (COZAAR ) 25 MG tablet Take 1 tablet (25 mg total) by mouth daily.   mirtazapine  (REMERON ) 15 MG tablet TAKE ONE TABLET BY MOUTH AT BEDTIME (Patient taking differently: Take 7.5 mg by mouth at bedtime.)   rosuvastatin  (CRESTOR ) 40 MG tablet TAKE 1 TABLET BY MOUTH EVERYDAY   ULTICARE MICRO PEN NEEDLES 32G X 4 MM MISC TO USE WITH INSULIN  DOSING THREE TIMES DAILY PRIOR TO MEALS AND AT BEDTIME FOR BASAL INSULIN .   VASCEPA  1 g capsule TAKE 1 CAPSULE BY MOUTH 2 TIMES DAILY   [DISCONTINUED] metoprolol  succinate (TOPROL -XL) 50 MG 24 hr tablet TAKE 1 TABLET BY MOUTH EVERY DAY   [DISCONTINUED] traMADol  (ULTRAM ) 50 MG tablet TAKE ONE TABLET BY MOUTH THREE TIMES DAILY AS NEEDED FOR MODERATE OR SEVERE PAIN   metoprolol  succinate (TOPROL -XL) 50 MG 24 hr tablet Take 1 tablet (50 mg total) by mouth daily.   traMADol  (ULTRAM ) 50 MG tablet TAKE ONE TABLET BY MOUTH THREE TIMES DAILY AS NEEDED FOR MODERATE OR SEVERE PAIN   No facility-administered encounter medications on file as of 07/08/2023.    Surgical History: Past Surgical History:  Procedure Laterality Date   ABDOMINAL HYSTERECTOMY  02/18/1988   BACK SURGERY  2000, 2004   CAROTID ENDARTERECTOMY Left 05/06/2012   CARPAL TUNNEL RELEASE Left 07/18/2015   Procedure: CARPAL TUNNEL RELEASE;  Surgeon: Kayla Pinal, MD;  Location: [facility] ORS;  Service: Orthopedics;  Laterality: Left;   CEREBRAL  ANGIOGRAM Bilateral 05/03/2012   Procedure: CEREBRAL ANGIOGRAM;  Surgeon: Lonni GORMAN Blade, MD;  Location: [facility] CATH LAB;  Service: Cardiovascular;  Laterality: Bilateral;   CHOLECYSTECTOMY  02/18/1999   COLONOSCOPY WITH PROPOFOL  N/A 11/19/2018   Procedure: COLONOSCOPY WITH PROPOFOL ;  Surgeon: Jinny Carmine, MD;  Location: [facility];  Service: Endoscopy;  Laterality: N/A;  Diabetic - insulin    CORONARY ARTERY BYPASS GRAFT N/A 01/18/2021   Procedure: CORONARY ARTERY BYPASS GRAFTING (CABG) x 4  USING LEFT INTERNAL  MAMMARY ARTERY AND LEFT ENDOSCOPIC GREATER SAPHENOUS VEIN CONDUITS;  Surgeon: Kerrin Elspeth BROCKS, MD;  Location: [facility] OR;  Service: Open Heart Surgery;  Laterality: N/A;   CORONARY/GRAFT ACUTE MI REVASCULARIZATION N/A 01/13/2021   Procedure: Coronary/Graft Acute MI Revascularization;  Surgeon: Ammon Blunt, MD;  Location: [facility] INVASIVE CV LAB;  Service: Cardiovascular;  Laterality: N/A;   ENDARTERECTOMY Left 05/06/2012   Procedure: ENDARTERECTOMY CAROTID;  Surgeon: Lonni GORMAN Blade, MD;  Location: [facility] OR;  Service: Vascular;  Laterality: Left;   ENDARTERECTOMY Right 08/09/2013   Procedure: ENDARTERECTOMY CAROTID-RIGHT;  Surgeon: Lonni GORMAN Blade, MD;  Location: [facility] OR;  Service: Vascular;  Laterality: Right;   ENDOVEIN HARVEST OF GREATER SAPHENOUS VEIN Left 01/18/2021   Procedure: ENDOVEIN HARVEST OF GREATER SAPHENOUS VEIN;  Surgeon: Kerrin Elspeth BROCKS, MD;  Location: [facility] OR;  Service: Open Heart Surgery;  Laterality: Left;   HERNIA REPAIR     IABP INSERTION Right 01/13/2021   Procedure: IABP Insertion;  Surgeon: Ammon Blunt, MD;  Location: [facility] INVASIVE CV LAB;  Service: Cardiovascular;  Laterality: Right;   LEFT HEART CATH AND CORONARY ANGIOGRAPHY N/A 01/13/2021   Procedure: LEFT HEART CATH AND CORONARY ANGIOGRAPHY;  Surgeon: Ammon Blunt, MD;  Location: [facility] INVASIVE CV LAB;  Service: Cardiovascular;  Laterality: N/A;   LOWER EXTREMITY ANGIOGRAPHY Left 01/03/2021   Procedure: LOWER EXTREMITY ANGIOGRAPHY;  Surgeon: Marea Selinda GORMAN, MD;  Location: [facility] INVASIVE CV LAB;  Service: Cardiovascular;  Laterality: Left;   LOWER EXTREMITY ANGIOGRAPHY Left 02/28/2021   Procedure: LOWER EXTREMITY ANGIOGRAPHY;  Surgeon: Marea Selinda GORMAN, MD;  Location: [facility] INVASIVE CV LAB;  Service: Cardiovascular;  Laterality: Left;   PATCH ANGIOPLASTY Left 05/06/2012   Procedure: WITH DACRON PATCH ANGIOPLASTY ;  Surgeon: Lonni GORMAN Blade, MD;  Location: [facility] OR;  Service: Vascular;   Laterality: Left;   POLYPECTOMY  11/19/2018   Procedure: POLYPECTOMY;  Surgeon: Jinny Carmine, MD;  Location: [facility]ERY [facility];  Service: Endoscopy;;   SPINE SURGERY  02/17/2002   TEE WITHOUT CARDIOVERSION N/A 01/18/2021   Procedure: TRANSESOPHAGEAL ECHOCARDIOGRAM (TEE);  Surgeon: Kerrin Elspeth BROCKS, MD;  Location: [facility] OR;  Service: Open Heart Surgery;  Laterality: N/A;   TONSILLECTOMY     TUBAL LIGATION      Medical History: Past Medical History:  Diagnosis Date   Anxiety    Arthritis    Bilateral carotid artery stenosis 2014   Carotid artery occlusion    Chronic kidney disease 06/18/2015   UTI   Chronic kidney disease 2017   Current smoker    CVA (cerebral vascular accident) (HCC) 2013   Depression    Diabetes (HCC)    Diverticulosis    Fatty liver    Fibromyalgia    GERD (gastroesophageal reflux disease)    H/O hiatal hernia    Heart attack (HCC) 01/12/2021   Hiatal hernia    Hypercholesteremia    Hypertension    IBS (irritable bowel syndrome)    PAD (peripheral artery disease) (HCC)    Peptic ulcer  Plantar fasciitis    Stroke (HCC) 01/31/2012   Right side-ministroke   T2DM (type 2 diabetes mellitus) (HCC)     Family History: Family History  Problem Relation Age of Onset   Hypertension Mother    Hyperlipidemia Mother    Deep vein thrombosis Mother    Cancer Father    Alcohol abuse Father    Heart failure Father    Hypertension Maternal Grandmother    Deep vein thrombosis Sister    Alcohol abuse Sister    Alcohol abuse Sister     Social History   Socioeconomic History   Marital status: Married    Spouse name: Not on file   Number of children: Not on file   Years of education: Not on file   Highest education level: Not on file  Occupational History   Not on file  Tobacco Use   Smoking status: Former    Current packs/day: 1.00    Average packs/day: 1 pack/day for 52.7 years (52.7 ttl pk-yrs)    Types: Cigarettes    Start date: 11/09/1970     Passive exposure: Current   Smokeless tobacco: Never   Tobacco comments:       Vaping Use   Vaping status: Former  Substance and Sexual Activity   Alcohol use: Not Currently    Comment: rarely   Drug use: No   Sexual activity: Not Currently    Partners: Male  Other Topics Concern   Not on file  Social History Narrative   ** Merged History Encounter **       Social Drivers of Health   Financial Resource Strain: Not on file  Food Insecurity: No Food Insecurity (05/15/2023)   Hunger Vital Sign    Worried About Running Out of Food in the Last Year: Never true    Ran Out of Food in the Last Year: Never true  Transportation Needs: No Transportation Needs (05/15/2023)   PRAPARE - Administrator, Civil Service (Medical): No    Lack of Transportation (Non-Medical): No  Physical Activity: Not on file  Stress: Not on file  Social Connections: Not on file  Intimate Partner Violence: Not At Risk (05/15/2023)   Humiliation, Afraid, Rape, and Kick questionnaire    Fear of Current or Ex-Partner: No    Emotionally Abused: No    Physically Abused: No    Sexually Abused: No      Review of Systems  Constitutional:  Positive for fatigue. Negative for chills and unexpected weight change.  HENT:  Negative for congestion, rhinorrhea, sneezing and sore throat.   Eyes:  Negative for redness.  Respiratory: Negative.  Negative for cough, chest tightness, shortness of breath and wheezing.   Cardiovascular: Negative.  Negative for chest pain and palpitations.  Gastrointestinal:  Negative for abdominal pain, constipation, diarrhea, nausea and vomiting.  Endocrine: Positive for polydipsia and polyuria.  Genitourinary:  Negative for difficulty urinating, dysuria, flank pain, frequency, hematuria and urgency.  Musculoskeletal:  Positive for arthralgias and back pain. Negative for joint swelling and neck pain.  Skin:  Negative for rash.  Neurological:  Positive for headaches. Negative  for tremors and numbness.  Hematological:  Negative for adenopathy. Does not bruise/bleed easily.  Psychiatric/Behavioral:  Negative for behavioral problems (Depression), sleep disturbance and suicidal ideas. The patient is not nervous/anxious.     Vital Signs: BP 135/80 Comment: 150/98  Pulse 99   Temp 98 F (36.7 C)   Resp 16   Ht 5' 7 (  1.702 m)   Wt 178 lb (80.7 kg)   LMP  (LMP Unknown) Comment: pt intubated  SpO2 93%   BMI 27.88 kg/m    Physical Exam Vitals reviewed.  Constitutional:      General: She is not in acute distress.    Appearance: Normal appearance. She is obese. She is not ill-appearing.  HENT:     Head: Normocephalic and atraumatic.   Eyes:     Pupils: Pupils are equal, round, and reactive to light.    Cardiovascular:     Rate and Rhythm: Normal rate and regular rhythm.  Pulmonary:     Effort: Pulmonary effort is normal. No respiratory distress.   Neurological:     Mental Status: She is alert and oriented to person, place, and time.   Psychiatric:        Mood and Affect: Mood normal.        Behavior: Behavior normal.        Assessment/Plan: 1. Type 2 diabetes mellitus with diabetic polyneuropathy, with long-term current use of insulin  (HCC) (Primary) Continue medications as prescribed and patient will get spring loaded disposable lancets from [facility] as discussed so that she can check her glucose   2. Hypertension associated with type 2 diabetes mellitus (HCC) Stable, continue metoprolol  and losartan  as prescribed.  - metoprolol  succinate (TOPROL -XL) 50 MG 24 hr tablet; Take 1 tablet (50 mg total) by mouth daily.  Dispense: 90 tablet; Refill: 3  3. Hyperlipidemia associated with type 2 diabetes mellitus (HCC) Continue fenofibrate , rosuvastatin  and vascepa  as prescribed.   4. Heel pain, bilateral Continue prn tramadol  as prescribed.  - traMADol  (ULTRAM ) 50 MG tablet; TAKE ONE TABLET BY MOUTH THREE TIMES DAILY AS NEEDED FOR MODERATE OR  SEVERE PAIN  Dispense: 90 tablet; Refill: 2   General Counseling: Morgan Daniels verbalizes understanding of the findings of todays visit and agrees with plan of treatment. I have discussed any further diagnostic evaluation that may be needed or ordered today. We also reviewed her medications today. she has been encouraged to call the office with any questions or concerns that should arise related to todays visit.    No orders of the defined types were placed in this encounter.   Meds ordered this encounter  Medications   traMADol  (ULTRAM ) 50 MG tablet    Sig: TAKE ONE TABLET BY MOUTH THREE TIMES DAILY AS NEEDED FOR MODERATE OR SEVERE PAIN    Dispense:  90 tablet    Refill:  2    PT NEEDS REFILLS ASAP **BUBBLE PACK PT///   metoprolol  succinate (TOPROL -XL) 50 MG 24 hr tablet    Sig: Take 1 tablet (50 mg total) by mouth daily.    Dispense:  90 tablet    Refill:  3    FOR NEXT FILL    Return for need nurse visit next week for a1c check and f/u with Suheily Birks in 2 months .   Total time spent:30 Minutes Time spent includes review of chart, medications, test results, and follow up plan with the patient.   [place] Controlled Substance Database was reviewed by me.  This patient was seen by Mardy Maxin, FNP-C in collaboration with Dr. Sigrid Bathe as a part of collaborative care agreement.   Elzena Muston R. Maxin, MSN, FNP-C Internal medicine

## 2023-07-15 ENCOUNTER — Ambulatory Visit

## 2023-07-15 NOTE — Progress Notes (Deleted)
 Psychiatric Initial Adult Assessment   Patient Identification: Morgan Daniels MRN:  [id number] Date of Evaluation:  07/15/2023 Referral Source: *** Chief Complaint:  No chief complaint on file.  Visit Diagnosis: No diagnosis found.  History of Present Illness:   Morgan Daniels is a 63 y.o. year old female with a history of depression, anxiety, CVA, CAD s/p CABG (2022), PAD s/p left SFA stent, hypertension, hyperlipidemia, COPD, chronic pain on opioids, who is referred for depression, anxiety.   According to the chart review, gabapentin  was increased to 800 mg three times a day for polyneuropathy.   Lexapro  10 mg daily,        Associated Signs/Symptoms: Depression Symptoms:  {DEPRESSION SYMPTOMS:20000} (Hypo) Manic Symptoms:  {BHH MANIC SYMPTOMS:22872} Anxiety Symptoms:  {BHH ANXIETY SYMPTOMS:22873} Psychotic Symptoms:  {BHH PSYCHOTIC SYMPTOMS:22874} PTSD Symptoms: {BHH PTSD SYMPTOMS:22875}  Past Psychiatric History:  Outpatient:  Psychiatry admission:  Previous suicide attempt:  Past trials of medication:  History of violence:  History of head injury:   Previous Psychotropic Medications: {YES/NO:21197}  Substance Abuse History in the last 12 months:  {yes no:314532}  Consequences of Substance Abuse: {BHH CONSEQUENCES OF SUBSTANCE ABUSE:22880}  Past Medical History:  Past Medical History:  Diagnosis Date   Anxiety    Arthritis    Bilateral carotid artery stenosis 2014   Carotid artery occlusion    Chronic kidney disease 06/18/2015   UTI   Chronic kidney disease 2017   Current smoker    CVA (cerebral vascular accident) (HCC) 2013   Depression    Diabetes (HCC)    Diverticulosis    Fatty liver    Fibromyalgia    GERD (gastroesophageal reflux disease)    H/O hiatal hernia    Heart attack (HCC) 01/12/2021   Hiatal hernia    Hypercholesteremia    Hypertension    IBS (irritable bowel syndrome)    PAD (peripheral artery disease) (HCC)    Peptic ulcer     Plantar fasciitis    Stroke (HCC) 01/31/2012   Right side-ministroke   T2DM (type 2 diabetes mellitus) (HCC)     Past Surgical History:  Procedure Laterality Date   ABDOMINAL HYSTERECTOMY  02/18/1988   BACK SURGERY  2000, 2004   CAROTID ENDARTERECTOMY Left 05/06/2012   CARPAL TUNNEL RELEASE Left 07/18/2015   Procedure: CARPAL TUNNEL RELEASE;  Surgeon: Marlynn Singer, MD;  Location: [facility] ORS;  Service: Orthopedics;  Laterality: Left;   CEREBRAL ANGIOGRAM Bilateral 05/03/2012   Procedure: CEREBRAL ANGIOGRAM;  Surgeon: Dannis Dy, MD;  Location: [facility] CATH LAB;  Service: Cardiovascular;  Laterality: Bilateral;   CHOLECYSTECTOMY  02/18/1999   COLONOSCOPY WITH PROPOFOL  N/A 11/19/2018   Procedure: COLONOSCOPY WITH PROPOFOL ;  Surgeon: Marnee Sink, MD;  Location: [facility]ERY [facility];  Service: Endoscopy;  Laterality: N/A;  Diabetic - insulin    CORONARY ARTERY BYPASS GRAFT N/A 01/18/2021   Procedure: CORONARY ARTERY BYPASS GRAFTING (CABG) x 4  USING LEFT INTERNAL MAMMARY ARTERY AND LEFT ENDOSCOPIC GREATER SAPHENOUS VEIN CONDUITS;  Surgeon: Zelphia Higashi, MD;  Location: [facility] OR;  Service: Open Heart Surgery;  Laterality: N/A;   CORONARY/GRAFT ACUTE MI REVASCULARIZATION N/A 01/13/2021   Procedure: Coronary/Graft Acute MI Revascularization;  Surgeon: Percival Brace, MD;  Location: [facility] INVASIVE CV LAB;  Service: Cardiovascular;  Laterality: N/A;   ENDARTERECTOMY Left 05/06/2012   Procedure: ENDARTERECTOMY CAROTID;  Surgeon: Dannis Dy, MD;  Location: [facility] OR;  Service: Vascular;  Laterality: Left;   ENDARTERECTOMY Right 08/09/2013   Procedure: ENDARTERECTOMY CAROTID-RIGHT;  Surgeon:  Dannis Dy, MD;  Location: [facility] OR;  Service: Vascular;  Laterality: Right;   ENDOVEIN HARVEST OF GREATER SAPHENOUS VEIN Left 01/18/2021   Procedure: ENDOVEIN HARVEST OF GREATER SAPHENOUS VEIN;  Surgeon: Zelphia Higashi, MD;  Location: [facility];  Service: Open Heart Surgery;   Laterality: Left;   HERNIA REPAIR     IABP INSERTION Right 01/13/2021   Procedure: IABP Insertion;  Surgeon: Percival Brace, MD;  Location: [facility] INVASIVE CV LAB;  Service: Cardiovascular;  Laterality: Right;   LEFT HEART CATH AND CORONARY ANGIOGRAPHY N/A 01/13/2021   Procedure: LEFT HEART CATH AND CORONARY ANGIOGRAPHY;  Surgeon: Percival Brace, MD;  Location: [facility] INVASIVE CV LAB;  Service: Cardiovascular;  Laterality: N/A;   LOWER EXTREMITY ANGIOGRAPHY Left 01/03/2021   Procedure: LOWER EXTREMITY ANGIOGRAPHY;  Surgeon: Celso College, MD;  Location: [facility] INVASIVE CV LAB;  Service: Cardiovascular;  Laterality: Left;   LOWER EXTREMITY ANGIOGRAPHY Left 02/28/2021   Procedure: LOWER EXTREMITY ANGIOGRAPHY;  Surgeon: Celso College, MD;  Location: [facility] INVASIVE CV LAB;  Service: Cardiovascular;  Laterality: Left;   PATCH ANGIOPLASTY Left 05/06/2012   Procedure: WITH DACRON PATCH ANGIOPLASTY ;  Surgeon: Dannis Dy, MD;  Location: [facility] OR;  Service: Vascular;  Laterality: Left;   POLYPECTOMY  11/19/2018   Procedure: POLYPECTOMY;  Surgeon: Marnee Sink, MD;  Location: [facility];  Service: Endoscopy;;   SPINE SURGERY  02/17/2002   TEE WITHOUT CARDIOVERSION N/A 01/18/2021   Procedure: TRANSESOPHAGEAL ECHOCARDIOGRAM (TEE);  Surgeon: Zelphia Higashi, MD;  Location: [facility] OR;  Service: Open Heart Surgery;  Laterality: N/A;   TONSILLECTOMY     TUBAL LIGATION      Family Psychiatric History: ***  Family History:  Family History  Problem Relation Age of Onset   Hypertension Mother    Hyperlipidemia Mother    Deep vein thrombosis Mother    Cancer Father    Alcohol abuse Father    Heart failure Father    Hypertension Maternal Grandmother    Deep vein thrombosis Sister    Alcohol abuse Sister    Alcohol abuse Sister     Social History:   Social History   Socioeconomic History   Marital status: Married    Spouse name: Not on file   Number of children: Not on file    Years of education: Not on file   Highest education level: Not on file  Occupational History   Not on file  Tobacco Use   Smoking status: Former    Current packs/day: 1.00    Average packs/day: 1 pack/day for 52.7 years (52.7 ttl pk-yrs)    Types: Cigarettes    Start date: 11/09/1970    Passive exposure: Current   Smokeless tobacco: Never   Tobacco comments:       Vaping Use   Vaping status: Former  Substance and Sexual Activity   Alcohol use: Not Currently    Comment: rarely   Drug use: No   Sexual activity: Not Currently    Partners: Male  Other Topics Concern   Not on file  Social History Narrative   ** Merged History Encounter **       Social Drivers of Health   Financial Resource Strain: Not on file  Food Insecurity: No Food Insecurity (05/15/2023)   Hunger Vital Sign    Worried About Running Out of Food in the Last Year: Never true    Ran Out of Food in the Last Year: Never true  Transportation Needs:  No Transportation Needs (05/15/2023)   PRAPARE - Administrator, Civil Service (Medical): No    Lack of Transportation (Non-Medical): No  Physical Activity: Not on file  Stress: Not on file  Social Connections: Not on file    Additional Social History: ***  Allergies:   Allergies  Allergen Reactions   Simvastatin  Diarrhea and Nausea And Vomiting   Morphine  And Codeine Itching   Lactose Intolerance (Gi) Diarrhea    bloating   Latex Rash    Rash when she wears gloves (Negative by test - per pt)    Metabolic Disorder Labs: Lab Results  Component Value Date   HGBA1C 10.8 (A) 04/14/2023   MPG 231.69 09/18/2021   MPG 206 01/14/2021   No results found for: "PROLACTIN" Lab Results  Component Value Date   CHOL 234 (H) 05/15/2023   TRIG 482 (H) 05/15/2023   HDL 32 (L) 05/15/2023   CHOLHDL 7.3 05/15/2023   VLDL UNABLE TO CALCULATE IF TRIGLYCERIDE OVER 400 mg/dL 16/11/9602   LDLCALC UNABLE TO CALCULATE IF TRIGLYCERIDE OVER 400 mg/dL 54/10/8117    LDLCALC UNABLE TO CALCULATE IF TRIGLYCERIDE OVER 400 mg/dL 14/78/2956   Lab Results  Component Value Date   TSH 1.013 05/15/2023    Therapeutic Level Labs: No results found for: "LITHIUM" No results found for: "CBMZ" No results found for: "VALPROATE"  Current Medications: Current Outpatient Medications  Medication Sig Dispense Refill   Advocate Safety Lancets 26G MISC Use 1 safety lancet to check glucose 4 times daily  for diabetes E11.65 100 each 12   clopidogrel  (PLAVIX ) 75 MG tablet Take 1 tablet (75 mg total) by mouth daily. 90 tablet 3   dicyclomine  (BENTYL ) 10 MG capsule TAKE 1 CAPSULE BY MOUTH THREE TIMES DAILY BEFORE MEALS 270 capsule 1   escitalopram  (LEXAPRO ) 10 MG tablet Take 10 mg by mouth daily.     fenofibrate  54 MG tablet Take 1 tablet (54 mg total) by mouth daily. 90 tablet 1   gabapentin  (NEURONTIN ) 800 MG tablet Take 1 tablet by mouth 3 (three) times daily.     glipiZIDE  (GLUCOTROL  XL) 5 MG 24 hr tablet Take 1 tablet (5 mg total) by mouth daily with breakfast. 90 tablet 1   glucose blood (ACCU-CHEK GUIDE) test strip Use 1 test strip to check glucose before meals, at bedtime and prn with glucose meter 100 each 12   insulin  degludec (TRESIBA  FLEXTOUCH) 200 UNIT/ML FlexTouch Pen Inject 40 Units into the skin at bedtime. Increase by 4 units every 4 days if AM sugar >200.     isosorbide  mononitrate (IMDUR ) 30 MG 24 hr tablet Take 1 tablet (30 mg total) by mouth daily. 90 tablet 1   JARDIANCE  25 MG TABS tablet TAKE 1 TABLET EVERY DAY BEFORE BREAKFAST. DISCONTINUE FARXIGA  90 tablet 3   losartan  (COZAAR ) 25 MG tablet Take 1 tablet (25 mg total) by mouth daily. 90 tablet 3   metoprolol  succinate (TOPROL -XL) 50 MG 24 hr tablet Take 1 tablet (50 mg total) by mouth daily. 90 tablet 3   mirtazapine  (REMERON ) 15 MG tablet TAKE ONE TABLET BY MOUTH AT BEDTIME (Patient taking differently: Take 7.5 mg by mouth at bedtime.) 90 tablet 1   rosuvastatin  (CRESTOR ) 40 MG tablet TAKE 1  TABLET BY MOUTH EVERYDAY 90 tablet 3   traMADol  (ULTRAM ) 50 MG tablet TAKE ONE TABLET BY MOUTH THREE TIMES DAILY AS NEEDED FOR MODERATE OR SEVERE PAIN 90 tablet 2   ULTICARE MICRO PEN NEEDLES 32G X  4 MM MISC TO USE WITH INSULIN  DOSING THREE TIMES DAILY PRIOR TO MEALS AND AT BEDTIME FOR BASAL INSULIN .     VASCEPA  1 g capsule TAKE 1 CAPSULE BY MOUTH 2 TIMES DAILY 120 capsule 1   No current facility-administered medications for this visit.    Musculoskeletal: Strength & Muscle Tone: within normal limits Gait & Station: normal Patient leans: N/A  Psychiatric Specialty Exam: Review of Systems  There were no vitals taken for this visit.There is no height or weight on file to calculate BMI.  General Appearance: {Appearance:22683}  Eye Contact:  {BHH EYE CONTACT:22684}  Speech:  Clear and Coherent  Volume:  Normal  Mood:  {BHH MOOD:22306}  Affect:  {Affect (PAA):22687}  Thought Process:  Coherent  Orientation:  Full (Time, Place, and Person)  Thought Content:  Logical  Suicidal Thoughts:  {ST/HT (PAA):22692}  Homicidal Thoughts:  {ST/HT (PAA):22692}  Memory:  Immediate;   Good  Judgement:  {Judgement (PAA):22694}  Insight:  {Insight (PAA):22695}  Psychomotor Activity:  Normal  Concentration:  Concentration: Good and Attention Span: Good  Recall:  Good  Fund of Knowledge:Good  Language: Good  Akathisia:  No  Handed:  Right  AIMS (if indicated):  not done  Assets:  Communication Skills Desire for Improvement  ADL's:  Intact  Cognition: WNL  Sleep:  {BHH GOOD/FAIR/POOR:22877}   Screenings: AUDIT    Loss adjuster, chartered Office Visit from 08/28/2020 in [facility], [facility]  Alcohol Use Disorder Identification Test Final Score (AUDIT) 3      Mini-Mental    Flowsheet Row Video Visit from 01/21/2023 in [facility], [facility] Office Visit from 01/15/2022 in [facility], [facility] Clinical Support from 12/25/2020 in [facility], [facility] Clinical Support from  06/11/2017 in [facility], [facility]  Total Score (max 30 points ) 30 29 30 30       PHQ2-9    Flowsheet Row Video Visit from 01/21/2023 in [facility], [facility] Office Visit from 07/24/2022 in [facility], [facility] Office Visit from 04/16/2021 in [facility] Clinical Support from 12/25/2020 in [facility], [facility] Office Visit from 11/08/2020 in [facility], [facility]  PHQ-2 Total Score 3 0 4 0 0  PHQ-9 Total Score 20 -- 10 -- --      Flowsheet Row ED to Hosp-Admission (Discharged) from 05/14/2023 in [facility] PCU Video Visit from 01/21/2023 in [facility], [facility] ED to Hosp-Admission (Discharged) from 02/01/2022 in [facility] REGIONAL CARDIAC MED PCU  C-SSRS RISK CATEGORY No Risk Error: Q3, 4, or 5 should not be populated when Q2 is No No Risk       Assessment and Plan:    Plan   The patient demonstrates the following risk factors for suicide: Chronic risk factors for suicide include: {Chronic Risk Factors for UEAVWUJ:81191478}. Acute risk factors for suicide include: {Acute Risk Factors for GNFAOZH:08657846}. Protective factors for this patient include: {Protective Factors for Suicide NGEX:52841324}. Considering these factors, the overall suicide risk at this point appears to be {Desc; low/moderate/high:110033}. Patient {ACTION; IS/IS MWN:02725366} appropriate for outpatient follow up.   Collaboration of Care: {BH OP Collaboration of Care:21014065}  Patient/Guardian was advised Release of Information must be obtained prior to any record release in order to collaborate their care with an outside provider. Patient/Guardian was advised if they have not already done so to contact the registration department to sign all necessary forms in order for us  to release information regarding their care.  Consent: Patient/Guardian gives verbal consent for treatment and assignment of benefits for  services provided during this visit. Patient/Guardian expressed understanding and agreed to proceed.   Todd Fossa, MD 5/28/20255:26 PM

## 2023-07-20 ENCOUNTER — Ambulatory Visit: Admitting: Psychiatry

## 2023-07-27 ENCOUNTER — Telehealth: Payer: Self-pay

## 2023-07-27 DIAGNOSIS — Z79899 Other long term (current) drug therapy: Secondary | ICD-10-CM

## 2023-07-27 NOTE — Progress Notes (Signed)
   07/27/2023  Patient ID: Morgan Daniels, female   DOB: [birth year], 63 y.o.   MRN: [id number]  Attempted to contact patient for medication management/review. Left HIPAA compliant message for patient to return my call at their convenience.   Attempted for patient outreach. Will follow up with patient in 7-10 business days.  Thank you for allowing pharmacy to be a part of this patient's care.  Alexandria Angel, PharmD Clinical Pharmacist Cell: [phone]

## 2023-07-27 NOTE — Progress Notes (Signed)
   07/27/2023  Patient ID: Louella Rout, female   DOB: 1960/05/28, 63 y.o.   MRN: [id number]  I contacted [facility] prior to initiating patient outreach for the Diabetes True [place] metric to review the patient's fill history in [facility]. The patient recently experienced a cerebrovascular accident (CVA) in March 2025.  According to [facility], the patient last filled the following medications:  Vascepa : Filled on 02/20/2023 with a 60-day supply. As of 01/21/2023, documentation noted the patient was to continue this medication.  Rosuvastatin  40 mg: Filled on 02/16/2023 with a 90-day supply. (Per hospital discharge chart review, the patient should be on both fenofibrate  and rosuvastatin .)  Tresiba : Last filled on 11/24/2022.  Per [facility] records, the patient has filled prescriptions at three different pharmacies this year. A call to the patient will be necessary to confirm where medications are currently being filled.  Previous documentation indicates the patient was receiving adherence packaging within the past one to two years. This will be addressed during the patient call, along with verification of whether the patient is receiving medication assistance for any prescriptions with gaps in fill history.       Alexandria Angel, PharmD Clinical Pharmacist Cell: [phone]

## 2023-08-03 ENCOUNTER — Telehealth: Payer: Self-pay

## 2023-08-03 NOTE — Progress Notes (Signed)
   08/03/2023  Patient ID: Morgan Daniels, female   DOB: 11-23-1960, 63 y.o.   MRN: [id number]  Attempted to contact patient for medication management/review. Left HIPAA compliant message for patient to return my call at their convenience.   Second attempt for patient outreach. Will follow up with patient in 1014 business days.  Thank you for allowing pharmacy to be a part of this patient's care.  Alexandria Angel, PharmD Clinical Pharmacist Cell: [phone]

## 2023-08-13 ENCOUNTER — Telehealth: Payer: Self-pay

## 2023-08-14 ENCOUNTER — Other Ambulatory Visit: Payer: Self-pay

## 2023-08-14 DIAGNOSIS — F331 Major depressive disorder, recurrent, moderate: Secondary | ICD-10-CM

## 2023-08-14 MED ORDER — ESCITALOPRAM OXALATE 10 MG PO TABS
10.0000 mg | ORAL_TABLET | Freq: Every day | ORAL | 0 refills | Status: DC
Start: 2023-08-14 — End: 2023-10-22

## 2023-08-14 NOTE — Telephone Encounter (Signed)
 As per alyssa we sent med  and also lmom to pt that we sent

## 2023-08-16 ENCOUNTER — Encounter: Payer: Self-pay | Admitting: Nurse Practitioner

## 2023-08-19 ENCOUNTER — Other Ambulatory Visit: Payer: Self-pay

## 2023-08-19 DIAGNOSIS — E1159 Type 2 diabetes mellitus with other circulatory complications: Secondary | ICD-10-CM

## 2023-08-19 MED ORDER — FENOFIBRATE 54 MG PO TABS
54.0000 mg | ORAL_TABLET | Freq: Every day | ORAL | 1 refills | Status: DC
Start: 2023-08-19 — End: 2023-10-22

## 2023-08-19 MED ORDER — METOPROLOL SUCCINATE ER 50 MG PO TB24: 50.0000 mg | ORAL_TABLET | Freq: Every day | ORAL | 3 refills | Status: DC

## 2023-09-02 ENCOUNTER — Telehealth: Payer: Self-pay

## 2023-09-02 NOTE — Progress Notes (Signed)
   09/02/2023  Patient ID: Morgan Daniels, female   DOB: 1960-10-01, 63 y.o.   MRN: [id number]  Attempted to contact patient for medication management/review. Left HIPAA compliant message for patient to return my call at their convenience.   SThirdecond attempt for patient outreach. Will follow up with patient in 14 business days.  Thank you for allowing pharmacy to be a part of this patient's care.  Dorcas Solian, PharmD Clinical Pharmacist Cell: [phone]

## 2023-09-16 ENCOUNTER — Ambulatory Visit: Admitting: Nurse Practitioner

## 2023-10-22 ENCOUNTER — Telehealth: Admitting: Nurse Practitioner

## 2023-10-22 ENCOUNTER — Encounter: Payer: Self-pay | Admitting: Nurse Practitioner

## 2023-10-22 VITALS — Resp 16 | Ht 67.0 in

## 2023-10-22 DIAGNOSIS — N390 Urinary tract infection, site not specified: Secondary | ICD-10-CM | POA: Diagnosis not present

## 2023-10-22 DIAGNOSIS — Z79899 Other long term (current) drug therapy: Secondary | ICD-10-CM | POA: Diagnosis not present

## 2023-10-22 DIAGNOSIS — M064 Inflammatory polyarthropathy: Secondary | ICD-10-CM

## 2023-10-22 DIAGNOSIS — Z794 Long term (current) use of insulin: Secondary | ICD-10-CM

## 2023-10-22 DIAGNOSIS — B379 Candidiasis, unspecified: Secondary | ICD-10-CM | POA: Diagnosis not present

## 2023-10-22 DIAGNOSIS — F331 Major depressive disorder, recurrent, moderate: Secondary | ICD-10-CM

## 2023-10-22 DIAGNOSIS — K580 Irritable bowel syndrome with diarrhea: Secondary | ICD-10-CM

## 2023-10-22 DIAGNOSIS — E785 Hyperlipidemia, unspecified: Secondary | ICD-10-CM

## 2023-10-22 DIAGNOSIS — E1169 Type 2 diabetes mellitus with other specified complication: Secondary | ICD-10-CM

## 2023-10-22 DIAGNOSIS — E1142 Type 2 diabetes mellitus with diabetic polyneuropathy: Secondary | ICD-10-CM

## 2023-10-22 DIAGNOSIS — I152 Hypertension secondary to endocrine disorders: Secondary | ICD-10-CM

## 2023-10-22 MED ORDER — METOPROLOL SUCCINATE ER 50 MG PO TB24
50.0000 mg | ORAL_TABLET | Freq: Every day | ORAL | 3 refills | Status: AC
Start: 2023-10-22 — End: ?

## 2023-10-22 MED ORDER — ICOSAPENT ETHYL 1 G PO CAPS: 1.0000 g | ORAL_CAPSULE | Freq: Two times a day (BID) | ORAL | 1 refills | Status: DC

## 2023-10-22 MED ORDER — CIPROFLOXACIN HCL 500 MG PO TABS: 500.0000 mg | ORAL_TABLET | Freq: Two times a day (BID) | ORAL | 0 refills | Status: AC

## 2023-10-22 MED ORDER — DICYCLOMINE HCL 10 MG PO CAPS: ORAL_CAPSULE | ORAL | 1 refills | Status: AC

## 2023-10-22 MED ORDER — ESCITALOPRAM OXALATE 10 MG PO TABS: 10.0000 mg | ORAL_TABLET | Freq: Every day | ORAL | 0 refills | Status: DC

## 2023-10-22 MED ORDER — MIRTAZAPINE 15 MG PO TABS: 15.0000 mg | ORAL_TABLET | Freq: Every day | ORAL | 1 refills | Status: DC

## 2023-10-22 MED ORDER — GABAPENTIN 800 MG PO TABS: 800.0000 mg | ORAL_TABLET | Freq: Three times a day (TID) | ORAL | 5 refills | Status: DC

## 2023-10-22 MED ORDER — ROSUVASTATIN CALCIUM 40 MG PO TABS
40.0000 mg | ORAL_TABLET | Freq: Every day | ORAL | 3 refills | Status: AC
Start: 2023-10-22 — End: ?

## 2023-10-22 MED ORDER — EMPAGLIFLOZIN 25 MG PO TABS: 25.0000 mg | ORAL_TABLET | Freq: Every day | ORAL | 3 refills | Status: DC

## 2023-10-22 MED ORDER — LOSARTAN POTASSIUM 25 MG PO TABS: 25.0000 mg | ORAL_TABLET | Freq: Every day | ORAL | 3 refills | Status: AC

## 2023-10-22 MED ORDER — FENOFIBRATE 54 MG PO TABS: 54.0000 mg | ORAL_TABLET | Freq: Every day | ORAL | 1 refills | Status: DC

## 2023-10-22 MED ORDER — TRAMADOL HCL 50 MG PO TABS: ORAL_TABLET | ORAL | 2 refills | Status: DC

## 2023-10-22 MED ORDER — ISOSORBIDE MONONITRATE ER 30 MG PO TB24
30.0000 mg | ORAL_TABLET | Freq: Every day | ORAL | 1 refills | Status: AC
Start: 2023-10-22 — End: ?

## 2023-10-22 MED ORDER — CLOPIDOGREL BISULFATE 75 MG PO TABS: 75.0000 mg | ORAL_TABLET | Freq: Every day | ORAL | 3 refills | Status: AC

## 2023-10-22 MED ORDER — FLUCONAZOLE 150 MG PO TABS: 150.0000 mg | ORAL_TABLET | Freq: Once | ORAL | 0 refills | Status: AC

## 2023-10-22 MED ORDER — GLIPIZIDE ER 5 MG PO TB24: 5.0000 mg | ORAL_TABLET | Freq: Every day | ORAL | 1 refills | Status: DC

## 2023-10-22 NOTE — Progress Notes (Signed)
 [facility] [address] [place], [place] [address]  Internal MEDICINE  Telephone Visit  Patient Name: Morgan Daniels  [id number]  [id number]  Date of Service: 10/22/2023  I connected with the patient at 1630 by telephone and verified the patients identity using two identifiers.   I discussed the limitations, risks, security and privacy concerns of performing an evaluation and management service by telephone and the availability of in person appointments. I also discussed with the patient that there may be a patient responsible charge related to the service.  The patient expressed understanding and agrees to proceed.    Chief Complaint  Patient presents with   Telephone Screen    Med refills.   Telephone Assessment    HPI Morgan Daniels presents for a telehealth virtual visit for diabetes, anxiety, insomnia and chronic pain. Diabetes -- uncontrolled diabetes, issues with adherence to medication regiment and to checking glucose regularly. Stopped using insulin  completely.  Hypertension -- recent BP unavailable today due to telehealth visit. Patient has not been taking any of her medications so her blood pressure has most likely been elevated.  Chronic pain and neuropathy -- takes tramadol  as needed but does not have any right now.  IBS -- takes dicyclomine  but has run out.  Depression -- never received citalopram, still wants to try the medication.  UTI symptoms -- itching, frequency, urgency, pain with urination, back pain.    Current Medication: Outpatient Encounter Medications as of 10/22/2023  Medication Sig   ciprofloxacin  (CIPRO ) 500 MG tablet Take 1 tablet (500 mg total) by mouth 2 (two) times daily for 5 days. Take with food.   fluconazole  (DIFLUCAN ) 150 MG tablet Take 1 tablet (150 mg total) by mouth once for 1 dose. May take an additional dose after 3 days if still symptomatic.   [employer] Safety Lancets 26G MISC Use 1 safety lancet to check glucose 4 times daily  for  diabetes E11.65   clopidogrel  (PLAVIX ) 75 MG tablet Take 1 tablet (75 mg total) by mouth daily.   dicyclomine  (BENTYL ) 10 MG capsule TAKE 1 CAPSULE BY MOUTH THREE TIMES DAILY BEFORE MEALS   empagliflozin  (JARDIANCE ) 25 MG TABS tablet Take 1 tablet (25 mg total) by mouth daily.   escitalopram  (LEXAPRO ) 10 MG tablet Take 1 tablet (10 mg total) by mouth daily.   fenofibrate  54 MG tablet Take 1 tablet (54 mg total) by mouth daily.   gabapentin  (NEURONTIN ) 800 MG tablet Take 1 tablet (800 mg total) by mouth 3 (three) times daily.   glipiZIDE  (GLUCOTROL  XL) 5 MG 24 hr tablet Take 1 tablet (5 mg total) by mouth daily with breakfast.   glucose blood (ACCU-CHEK GUIDE) test strip Use 1 test strip to check glucose before meals, at bedtime and prn with glucose meter   icosapent  Ethyl (VASCEPA ) 1 g capsule Take 1 capsule (1 g total) by mouth 2 (two) times daily.   insulin  degludec (TRESIBA  FLEXTOUCH) 200 UNIT/ML FlexTouch Pen Inject 40 Units into the skin at bedtime. Increase by 4 units every 4 days if AM sugar >200.   isosorbide  mononitrate (IMDUR ) 30 MG 24 hr tablet Take 1 tablet (30 mg total) by mouth daily.   losartan  (COZAAR ) 25 MG tablet Take 1 tablet (25 mg total) by mouth daily.   metoprolol  succinate (TOPROL -XL) 50 MG 24 hr tablet Take 1 tablet (50 mg total) by mouth daily.   mirtazapine  (REMERON ) 15 MG tablet Take 1 tablet (15 mg total) by mouth at bedtime.   rosuvastatin  (  CRESTOR ) 40 MG tablet Take 1 tablet (40 mg total) by mouth daily.   traMADol  (ULTRAM ) 50 MG tablet TAKE ONE TABLET BY MOUTH THREE TIMES DAILY AS NEEDED FOR MODERATE OR SEVERE PAIN   ULTICARE MICRO PEN NEEDLES 32G X 4 MM MISC TO USE WITH INSULIN  DOSING THREE TIMES DAILY PRIOR TO MEALS AND AT BEDTIME FOR BASAL INSULIN .   [DISCONTINUED] clopidogrel  (PLAVIX ) 75 MG tablet Take 1 tablet (75 mg total) by mouth daily.   [DISCONTINUED] dicyclomine  (BENTYL ) 10 MG capsule TAKE 1 CAPSULE BY MOUTH THREE TIMES DAILY BEFORE MEALS    [DISCONTINUED] escitalopram  (LEXAPRO ) 10 MG tablet Take 1 tablet (10 mg total) by mouth daily.   [DISCONTINUED] fenofibrate  54 MG tablet Take 1 tablet (54 mg total) by mouth daily.   [DISCONTINUED] gabapentin  (NEURONTIN ) 800 MG tablet Take 1 tablet by mouth 3 (three) times daily.   [DISCONTINUED] glipiZIDE  (GLUCOTROL  XL) 5 MG 24 hr tablet Take 1 tablet (5 mg total) by mouth daily with breakfast.   [DISCONTINUED] isosorbide  mononitrate (IMDUR ) 30 MG 24 hr tablet Take 1 tablet (30 mg total) by mouth daily.   [DISCONTINUED] JARDIANCE  25 MG TABS tablet TAKE 1 TABLET EVERY DAY BEFORE BREAKFAST. DISCONTINUE FARXIGA    [DISCONTINUED] losartan  (COZAAR ) 25 MG tablet Take 1 tablet (25 mg total) by mouth daily.   [DISCONTINUED] metoprolol  succinate (TOPROL -XL) 50 MG 24 hr tablet Take 1 tablet (50 mg total) by mouth daily.   [DISCONTINUED] mirtazapine  (REMERON ) 15 MG tablet TAKE ONE TABLET BY MOUTH AT BEDTIME (Patient taking differently: Take 7.5 mg by mouth at bedtime.)   [DISCONTINUED] rosuvastatin  (CRESTOR ) 40 MG tablet TAKE 1 TABLET BY MOUTH EVERYDAY   [DISCONTINUED] traMADol  (ULTRAM ) 50 MG tablet TAKE ONE TABLET BY MOUTH THREE TIMES DAILY AS NEEDED FOR MODERATE OR SEVERE PAIN   [DISCONTINUED] VASCEPA  1 g capsule TAKE 1 CAPSULE BY MOUTH 2 TIMES DAILY   No facility-administered encounter medications on file as of 10/22/2023.    Surgical History: Past Surgical History:  Procedure Laterality Date   ABDOMINAL HYSTERECTOMY  02/18/1988   BACK SURGERY  2000, 2004   CAROTID ENDARTERECTOMY Left 05/06/2012   CARPAL TUNNEL RELEASE Left 07/18/2015   Procedure: CARPAL TUNNEL RELEASE;  Surgeon: Morgan Pinal, MD;  Location: [facility] ORS;  Service: Orthopedics;  Laterality: Left;   CEREBRAL ANGIOGRAM Bilateral 05/03/2012   Procedure: CEREBRAL ANGIOGRAM;  Surgeon: Morgan GORMAN Blade, MD;  Location: [facility] CATH LAB;  Service: Cardiovascular;  Laterality: Bilateral;   CHOLECYSTECTOMY  02/18/1999   COLONOSCOPY WITH PROPOFOL   N/A 11/19/2018   Procedure: COLONOSCOPY WITH PROPOFOL ;  Surgeon: Morgan Carmine, MD;  Location: [facility];  Service: Endoscopy;  Laterality: N/A;  Diabetic - insulin    CORONARY ARTERY BYPASS GRAFT N/A 01/18/2021   Procedure: CORONARY ARTERY BYPASS GRAFTING (CABG) x 4  USING LEFT INTERNAL MAMMARY ARTERY AND LEFT ENDOSCOPIC GREATER SAPHENOUS VEIN CONDUITS;  Surgeon: Kerrin Elspeth BROCKS, MD;  Location: [facility] OR;  Service: Open Heart Surgery;  Laterality: N/A;   CORONARY/GRAFT ACUTE MI REVASCULARIZATION N/A 01/13/2021   Procedure: Coronary/Graft Acute MI Revascularization;  Surgeon: Ammon Blunt, MD;  Location: [facility] INVASIVE CV LAB;  Service: Cardiovascular;  Laterality: N/A;   ENDARTERECTOMY Left 05/06/2012   Procedure: ENDARTERECTOMY CAROTID;  Surgeon: Morgan GORMAN Blade, MD;  Location: [facility] OR;  Service: Vascular;  Laterality: Left;   ENDARTERECTOMY Right 08/09/2013   Procedure: ENDARTERECTOMY CAROTID-RIGHT;  Surgeon: Morgan GORMAN Blade, MD;  Location: [facility] OR;  Service: Vascular;  Laterality: Right;   ENDOVEIN HARVEST OF GREATER SAPHENOUS VEIN  Left 01/18/2021   Procedure: ENDOVEIN HARVEST OF GREATER SAPHENOUS VEIN;  Surgeon: Kerrin Elspeth BROCKS, MD;  Location: [facility] OR;  Service: Open Heart Surgery;  Laterality: Left;   HERNIA REPAIR     IABP INSERTION Right 01/13/2021   Procedure: IABP Insertion;  Surgeon: Ammon Blunt, MD;  Location: [facility] INVASIVE CV LAB;  Service: Cardiovascular;  Laterality: Right;   LEFT HEART CATH AND CORONARY ANGIOGRAPHY N/A 01/13/2021   Procedure: LEFT HEART CATH AND CORONARY ANGIOGRAPHY;  Surgeon: Ammon Blunt, MD;  Location: [facility] INVASIVE CV LAB;  Service: Cardiovascular;  Laterality: N/A;   LOWER EXTREMITY ANGIOGRAPHY Left 01/03/2021   Procedure: LOWER EXTREMITY ANGIOGRAPHY;  Surgeon: Marea Selinda RAMAN, MD;  Location: [facility] INVASIVE CV LAB;  Service: Cardiovascular;  Laterality: Left;   LOWER EXTREMITY ANGIOGRAPHY Left 02/28/2021   Procedure:  LOWER EXTREMITY ANGIOGRAPHY;  Surgeon: Marea Selinda RAMAN, MD;  Location: [facility] INVASIVE CV LAB;  Service: Cardiovascular;  Laterality: Left;   PATCH ANGIOPLASTY Left 05/06/2012   Procedure: WITH DACRON PATCH ANGIOPLASTY ;  Surgeon: Morgan RAMAN Blade, MD;  Location: [facility] OR;  Service: Vascular;  Laterality: Left;   POLYPECTOMY  11/19/2018   Procedure: POLYPECTOMY;  Surgeon: Morgan Carmine, MD;  Location: [facility];  Service: Endoscopy;;   SPINE SURGERY  02/17/2002   TEE WITHOUT CARDIOVERSION N/A 01/18/2021   Procedure: TRANSESOPHAGEAL ECHOCARDIOGRAM (TEE);  Surgeon: Kerrin Elspeth BROCKS, MD;  Location: [facility] OR;  Service: Open Heart Surgery;  Laterality: N/A;   TONSILLECTOMY     TUBAL LIGATION      Medical History: Past Medical History:  Diagnosis Date   Anxiety    Arthritis    Bilateral carotid artery stenosis 2014   Carotid artery occlusion    Chronic kidney disease 06/18/2015   UTI   Chronic kidney disease 2017   Current smoker    CVA (cerebral vascular accident) (HCC) 2013   Depression    Diabetes (HCC)    Diverticulosis    Fatty liver    Fibromyalgia    GERD (gastroesophageal reflux disease)    H/O hiatal hernia    Heart attack (HCC) 01/12/2021   Hiatal hernia    Hypercholesteremia    Hypertension    IBS (irritable bowel syndrome)    PAD (peripheral artery disease) (HCC)    Peptic ulcer    Plantar fasciitis    Stroke (HCC) 01/31/2012   Right side-ministroke   T2DM (type 2 diabetes mellitus) (HCC)     Family History: Family History  Problem Relation Age of Onset   Hypertension Mother    Hyperlipidemia Mother    Deep vein thrombosis Mother    Cancer Father    Alcohol abuse Father    Heart failure Father    Hypertension Maternal Grandmother    Deep vein thrombosis Sister    Alcohol abuse Sister    Alcohol abuse Sister     Social History   Socioeconomic History   Marital status: Married    Spouse name: Not on file   Number of children: Not on file    Years of education: Not on file   Highest education level: Not on file  Occupational History   Not on file  Tobacco Use   Smoking status: Former    Current packs/day: 1.00    Average packs/day: 1 pack/day for 53.0 years (53.0 ttl pk-yrs)    Types: Cigarettes    Start date: 11/09/1970    Passive exposure: Current   Smokeless tobacco: Never   Tobacco comments:  Vaping Use   Vaping status: Former  Substance and Sexual Activity   Alcohol use: Not Currently    Comment: rarely   Drug use: No   Sexual activity: Not Currently    Partners: Male  Other Topics Concern   Not on file  Social History Narrative   ** Merged History Encounter **       Social Drivers of Health   Financial Resource Strain: Not on file  Food Insecurity: No Food Insecurity (05/15/2023)   Hunger Vital Sign    Worried About Running Out of Food in the Last Year: Never true    Ran Out of Food in the Last Year: Never true  Transportation Needs: No Transportation Needs (05/15/2023)   PRAPARE - Administrator, Civil Service (Medical): No    Lack of Transportation (Non-Medical): No  Physical Activity: Not on file  Stress: Not on file  Social Connections: Not on file  Intimate Partner Violence: Not At Risk (05/15/2023)   Humiliation, Afraid, Rape, and Kick questionnaire    Fear of Current or Ex-Partner: No    Emotionally Abused: No    Physically Abused: No    Sexually Abused: No      Review of Systems  Constitutional:  Positive for fatigue. Negative for chills and unexpected weight change.  HENT:  Negative for congestion, rhinorrhea, sneezing and sore throat.   Eyes:  Negative for redness.  Respiratory: Negative.  Negative for cough, chest tightness, shortness of breath and wheezing.   Cardiovascular: Negative.  Negative for chest pain and palpitations.  Gastrointestinal:  Negative for abdominal pain, constipation, diarrhea, nausea and vomiting.  Endocrine: Positive for polydipsia and  polyuria.  Genitourinary:  Negative for difficulty urinating, dysuria, flank pain, frequency, hematuria and urgency.  Musculoskeletal:  Positive for arthralgias and back pain. Negative for joint swelling and neck pain.  Skin:  Negative for rash.  Neurological:  Positive for headaches. Negative for tremors and numbness.  Hematological:  Negative for adenopathy. Does not bruise/bleed easily.  Psychiatric/Behavioral:  Positive for behavioral problems (Depression), decreased concentration, dysphoric mood and sleep disturbance. Negative for self-injury and suicidal ideas. The patient is nervous/anxious.     Vital Signs: Resp 16   Ht 5' 7 (1.702 m)   LMP  (LMP Unknown) Comment: pt intubated  BMI 27.88 kg/m    Observation/Objective: She is alert and oriented. No acute distress noted.     Assessment/Plan: 1. Type 2 diabetes mellitus with diabetic polyneuropathy, with long-term current use of insulin  (HCC) (Primary) Referred to endocrinology for assistance with glucose and A1c control as well as encouraging adherence to medication regimen.  - glipiZIDE  (GLUCOTROL  XL) 5 MG 24 hr tablet; Take 1 tablet (5 mg total) by mouth daily with breakfast.  Dispense: 90 tablet; Refill: 1 - Ambulatory referral to Endocrinology - empagliflozin  (JARDIANCE ) 25 MG TABS tablet; Take 1 tablet (25 mg total) by mouth daily.  Dispense: 90 tablet; Refill: 3  2. Hypertension associated with type 2 diabetes mellitus (HCC) Continue medications as prescribed.  - losartan  (COZAAR ) 25 MG tablet; Take 1 tablet (25 mg total) by mouth daily.  Dispense: 90 tablet; Refill: 3 - metoprolol  succinate (TOPROL -XL) 50 MG 24 hr tablet; Take 1 tablet (50 mg total) by mouth daily.  Dispense: 90 tablet; Refill: 3  3. Hyperlipidemia associated with type 2 diabetes mellitus (HCC) Continue medications as prescribed.  - rosuvastatin  (CRESTOR ) 40 MG tablet; Take 1 tablet (40 mg total) by mouth daily.  Dispense: 90 tablet; Refill:  3 -  icosapent  Ethyl (VASCEPA ) 1 g capsule; Take 1 capsule (1 g total) by mouth 2 (two) times daily.  Dispense: 120 capsule; Refill: 1 - fenofibrate  54 MG tablet; Take 1 tablet (54 mg total) by mouth daily.  Dispense: 90 tablet; Refill: 1  4. Inflammatory polyarthritis (HCC) Continue prn tramadol  and gabapentin  as prescribed.  - traMADol  (ULTRAM ) 50 MG tablet; TAKE ONE TABLET BY MOUTH THREE TIMES DAILY AS NEEDED FOR MODERATE OR SEVERE PAIN  Dispense: 90 tablet; Refill: 2 - gabapentin  (NEURONTIN ) 800 MG tablet; Take 1 tablet (800 mg total) by mouth 3 (three) times daily.  Dispense: 90 tablet; Refill: 5  5. Irritable bowel syndrome with diarrhea Continue dicyclomine  as prescribed.  - dicyclomine  (BENTYL ) 10 MG capsule; TAKE 1 CAPSULE BY MOUTH THREE TIMES DAILY BEFORE MEALS  Dispense: 270 capsule; Refill: 1  6. Urinary tract infection without hematuria, site unspecified Ciprofloxacin  prescribed, take until gone.  - ciprofloxacin  (CIPRO ) 500 MG tablet; Take 1 tablet (500 mg total) by mouth 2 (two) times daily for 5 days. Take with food.  Dispense: 10 tablet; Refill: 0  7. Antibiotic-induced yeast infection Reports vaginal yeast infection now and she is prone to them with antibiotics - fluconazole  (DIFLUCAN ) 150 MG tablet; Take 1 tablet (150 mg total) by mouth once for 1 dose. May take an additional dose after 3 days if still symptomatic.  Dispense: 3 tablet; Refill: 0  8. Encounter for medication review Medication list reviewed, updated and refills ordered  - isosorbide  mononitrate (IMDUR ) 30 MG 24 hr tablet; Take 1 tablet (30 mg total) by mouth daily.  Dispense: 90 tablet; Refill: 1 - gabapentin  (NEURONTIN ) 800 MG tablet; Take 1 tablet (800 mg total) by mouth 3 (three) times daily.  Dispense: 90 tablet; Refill: 5 - clopidogrel  (PLAVIX ) 75 MG tablet; Take 1 tablet (75 mg total) by mouth daily.  Dispense: 90 tablet; Refill: 3  9. Moderate episode of recurrent major depressive disorder (HCC) Start  lexapro  as prescribed. Continue mirtazapine  for now - mirtazapine  (REMERON ) 15 MG tablet; Take 1 tablet (15 mg total) by mouth at bedtime.  Dispense: 90 tablet; Refill: 1 - escitalopram  (LEXAPRO ) 10 MG tablet; Take 1 tablet (10 mg total) by mouth daily.  Dispense: 90 tablet; Refill: 0   General Counseling: findley blankenbaker understanding of the findings of today's phone visit and agrees with plan of treatment. I have discussed any further diagnostic evaluation that may be needed or ordered today. We also reviewed her medications today. she has been encouraged to call the office with any questions or concerns that should arise related to todays visit.  Return in about 1 month (around 11/21/2023) for F/U, Theresa Dohrman PCP, eval new med.   Orders Placed This Encounter  Procedures   Ambulatory referral to Endocrinology    Meds ordered this encounter  Medications   traMADol  (ULTRAM ) 50 MG tablet    Sig: TAKE ONE TABLET BY MOUTH THREE TIMES DAILY AS NEEDED FOR MODERATE OR SEVERE PAIN    Dispense:  90 tablet    Refill:  2    PT NEEDS REFILLS ASAP **BUBBLE PACK PT///   rosuvastatin  (CRESTOR ) 40 MG tablet    Sig: Take 1 tablet (40 mg total) by mouth daily.    Dispense:  90 tablet    Refill:  3    FOR FUTURE REFILLS **BUBBLE PACK PT**   mirtazapine  (REMERON ) 15 MG tablet    Sig: Take 1 tablet (15 mg total) by mouth at bedtime.  Dispense:  90 tablet    Refill:  1    FOR FUTURE REFILLS **BUBLE PACK PT**   losartan  (COZAAR ) 25 MG tablet    Sig: Take 1 tablet (25 mg total) by mouth daily.    Dispense:  90 tablet    Refill:  3   isosorbide  mononitrate (IMDUR ) 30 MG 24 hr tablet    Sig: Take 1 tablet (30 mg total) by mouth daily.    Dispense:  90 tablet    Refill:  1   glipiZIDE  (GLUCOTROL  XL) 5 MG 24 hr tablet    Sig: Take 1 tablet (5 mg total) by mouth daily with breakfast.    Dispense:  90 tablet    Refill:  1   gabapentin  (NEURONTIN ) 800 MG tablet    Sig: Take 1 tablet (800 mg total) by  mouth 3 (three) times daily.    Dispense:  90 tablet    Refill:  5   escitalopram  (LEXAPRO ) 10 MG tablet    Sig: Take 1 tablet (10 mg total) by mouth daily.    Dispense:  90 tablet    Refill:  0   dicyclomine  (BENTYL ) 10 MG capsule    Sig: TAKE 1 CAPSULE BY MOUTH THREE TIMES DAILY BEFORE MEALS    Dispense:  270 capsule    Refill:  1    FOR FUTURE REFILLS **BUBBLE PACK PT**   clopidogrel  (PLAVIX ) 75 MG tablet    Sig: Take 1 tablet (75 mg total) by mouth daily.    Dispense:  90 tablet    Refill:  3   icosapent  Ethyl (VASCEPA ) 1 g capsule    Sig: Take 1 capsule (1 g total) by mouth 2 (two) times daily.    Dispense:  120 capsule    Refill:  1    FOR FUTURE REFILLS **BUBLE PACK PR**   fenofibrate  54 MG tablet    Sig: Take 1 tablet (54 mg total) by mouth daily.    Dispense:  90 tablet    Refill:  1   metoprolol  succinate (TOPROL -XL) 50 MG 24 hr tablet    Sig: Take 1 tablet (50 mg total) by mouth daily.    Dispense:  90 tablet    Refill:  3    FOR NEXT FILL   empagliflozin  (JARDIANCE ) 25 MG TABS tablet    Sig: Take 1 tablet (25 mg total) by mouth daily.    Dispense:  90 tablet    Refill:  3   ciprofloxacin  (CIPRO ) 500 MG tablet    Sig: Take 1 tablet (500 mg total) by mouth 2 (two) times daily for 5 days. Take with food.    Dispense:  10 tablet    Refill:  0    Fill new script today   fluconazole  (DIFLUCAN ) 150 MG tablet    Sig: Take 1 tablet (150 mg total) by mouth once for 1 dose. May take an additional dose after 3 days if still symptomatic.    Dispense:  3 tablet    Refill:  0    Fill new script today    Time spent:30 Minutes Time spent with patient included reviewing progress notes, labs, imaging studies, and discussing plan for follow up.  [place] Controlled Substance Database was reviewed by me for overdose risk score (ORS) if appropriate.  This patient was seen by Mardy Maxin, FNP-C in collaboration with Dr. Sigrid Bathe as a part of collaborative care  agreement.  Murrell Dome R. Maxin, MSN, FNP-C Internal medicine

## 2023-10-23 ENCOUNTER — Encounter: Payer: Self-pay | Admitting: Nurse Practitioner

## 2023-10-28 ENCOUNTER — Telehealth: Payer: Self-pay | Admitting: Nurse Practitioner

## 2023-10-28 NOTE — Telephone Encounter (Signed)
 Endocrinology referral sent via [facility] to [facility].  Notified patient. Gave telephone# [phone]

## 2023-11-23 NOTE — Progress Notes (Addendum)
 Morgan Daniels                                          MRN: [id number]   11/23/2023   The [facility] Quality Team Specialist reviewed this patient medical record for the purposes of chart review for care gap closure. The following were reviewed: abstraction for care gap closure-controlling blood pressure.   Morgan Daniels                                          MRN: [id number]   02/02/2024   The [facility] Quality Team Specialist reviewed this patient medical record for the purposes of chart review for care gap closure. The following were reviewed: chart review for care gap closure-glycemic status assessment and kidney health evaluation for diabetes:uACR. A1c is out of range and no UACR labs found.     [facility] Quality Team

## 2023-12-07 ENCOUNTER — Telehealth: Payer: Self-pay | Admitting: Nurse Practitioner

## 2023-12-07 NOTE — Telephone Encounter (Signed)
 Per [name] w/ [name] Endocrinology, referral has been closed due to patient not returning calls -Morgan Daniels

## 2024-01-18 ENCOUNTER — Telehealth: Payer: Self-pay | Admitting: Nurse Practitioner

## 2024-01-18 NOTE — Telephone Encounter (Signed)
 Left vm and sent mychart message to confirm 01/22/24 appointment-Toni

## 2024-01-22 ENCOUNTER — Ambulatory Visit: Payer: Medicare HMO | Admitting: Nurse Practitioner

## 2024-02-03 ENCOUNTER — Encounter: Payer: Self-pay | Admitting: Nurse Practitioner

## 2024-02-03 ENCOUNTER — Ambulatory Visit (INDEPENDENT_AMBULATORY_CARE_PROVIDER_SITE_OTHER): Admitting: Nurse Practitioner

## 2024-02-03 VITALS — BP 170/105 | HR 96 | Temp 97.4°F | Resp 16 | Ht 67.0 in | Wt 170.6 lb

## 2024-02-03 DIAGNOSIS — Z0001 Encounter for general adult medical examination with abnormal findings: Secondary | ICD-10-CM

## 2024-02-03 DIAGNOSIS — M064 Inflammatory polyarthropathy: Secondary | ICD-10-CM

## 2024-02-03 DIAGNOSIS — E1142 Type 2 diabetes mellitus with diabetic polyneuropathy: Secondary | ICD-10-CM

## 2024-02-03 DIAGNOSIS — Z794 Long term (current) use of insulin: Secondary | ICD-10-CM | POA: Diagnosis not present

## 2024-02-03 DIAGNOSIS — Z79899 Other long term (current) drug therapy: Secondary | ICD-10-CM

## 2024-02-03 DIAGNOSIS — Z87891 Personal history of nicotine dependence: Secondary | ICD-10-CM | POA: Diagnosis not present

## 2024-02-03 DIAGNOSIS — I70222 Atherosclerosis of native arteries of extremities with rest pain, left leg: Secondary | ICD-10-CM

## 2024-02-03 DIAGNOSIS — E538 Deficiency of other specified B group vitamins: Secondary | ICD-10-CM | POA: Diagnosis not present

## 2024-02-03 DIAGNOSIS — F332 Major depressive disorder, recurrent severe without psychotic features: Secondary | ICD-10-CM

## 2024-02-03 DIAGNOSIS — E559 Vitamin D deficiency, unspecified: Secondary | ICD-10-CM

## 2024-02-03 DIAGNOSIS — I152 Hypertension secondary to endocrine disorders: Secondary | ICD-10-CM

## 2024-02-03 DIAGNOSIS — E1159 Type 2 diabetes mellitus with other circulatory complications: Secondary | ICD-10-CM | POA: Diagnosis not present

## 2024-02-03 DIAGNOSIS — E785 Hyperlipidemia, unspecified: Secondary | ICD-10-CM

## 2024-02-03 DIAGNOSIS — E039 Hypothyroidism, unspecified: Secondary | ICD-10-CM

## 2024-02-03 DIAGNOSIS — Z1231 Encounter for screening mammogram for malignant neoplasm of breast: Secondary | ICD-10-CM | POA: Diagnosis not present

## 2024-02-03 DIAGNOSIS — I110 Hypertensive heart disease with heart failure: Secondary | ICD-10-CM

## 2024-02-03 DIAGNOSIS — E1169 Type 2 diabetes mellitus with other specified complication: Secondary | ICD-10-CM | POA: Diagnosis not present

## 2024-02-03 DIAGNOSIS — F331 Major depressive disorder, recurrent, moderate: Secondary | ICD-10-CM

## 2024-02-03 MED ORDER — DEXCOM G7 SENSOR MISC: 11 refills | Status: AC

## 2024-02-03 MED ORDER — GLIPIZIDE ER 5 MG PO TB24: 5.0000 mg | ORAL_TABLET | Freq: Every day | ORAL | 1 refills | Status: AC

## 2024-02-03 MED ORDER — ESCITALOPRAM OXALATE 10 MG PO TABS: 10.0000 mg | ORAL_TABLET | Freq: Every day | ORAL | 0 refills | Status: DC

## 2024-02-03 MED ORDER — FENOFIBRATE 54 MG PO TABS: 54.0000 mg | ORAL_TABLET | Freq: Every day | ORAL | 1 refills | Status: AC

## 2024-02-03 MED ORDER — VENLAFAXINE HCL ER 75 MG PO CP24: 75.0000 mg | ORAL_CAPSULE | Freq: Every day | ORAL | 1 refills | Status: AC

## 2024-02-03 MED ORDER — EMPAGLIFLOZIN 25 MG PO TABS: 25.0000 mg | ORAL_TABLET | Freq: Every day | ORAL | 3 refills | Status: AC

## 2024-02-03 MED ORDER — ICOSAPENT ETHYL 1 G PO CAPS: 1.0000 g | ORAL_CAPSULE | Freq: Two times a day (BID) | ORAL | 1 refills | Status: AC

## 2024-02-03 MED ORDER — GABAPENTIN 800 MG PO TABS: 800.0000 mg | ORAL_TABLET | Freq: Three times a day (TID) | ORAL | 5 refills | Status: AC

## 2024-02-03 NOTE — Progress Notes (Signed)
 [facility] [address] [place], [place] [address]  Internal MEDICINE  Office Visit Note  Patient Name: Morgan Daniels  [id number]  [id number]  Date of Service: 02/03/2024  Chief Complaint  Patient presents with   Depression   Gastroesophageal Reflux   Diabetes   Hyperlipidemia   Hypertension   Medicare Wellness    HPI Asuka presents for a medicare annual wellness visit.  Well-appearing 63 y.o. female with hypertension, carotid stenosis, PAD, CAD, allergic rhinitis, IBS, diverticulosis, GERD, diabetes, hypothyroidism, high cholesterol, depression, anxiety, chronic pain, arthritis, low vitamin D , and obesity. Also has a history of NSTEMI and stroke.  Routine CRC screening: due in 2030. Routine mammogram: due now  DEXA scan: not due yet.  Pap smear: discontinued, s/p hysterectomy.  Eye exam: overdue  foot exam: done Labs: overdue Due for annual lung cancer screening, Quit smoking about 2 years ago New or worsening pain: chronic back pain Other concerns: none, patient is depressed and does not feel motivated to take care of her medical problems appropriately. She has a supportive spouse who helps her however he can but the patient is resistant at times and will refuse help when she needs it. We discussed the need to work on managing her medical problems and controlling her depression.   Dr. Paraschos for cardiology -- last visit was in August 2024, needs to get scheduled.  She has been out of many of her medications She still has frequent diarrhea and poor appetite. She eats foods that are convenient and does not always eat 3 meals a day She is depressed, severely. She has anhedonia and sleeps a lot throughout the day. She has been neglecting her ADLs. She does not shower regularly. She is disheveled and unkempt at her visit today and is still wearing her pajamas.  She is overdue for routine labs. She needs new CGM sensors. She has not taken her basal insulin  in  several months. She drinks very little water  and is mostly drinking tea and diet sodas.       02/03/2024    1:35 PM 01/21/2023   12:04 PM 01/15/2022   10:30 AM  MMSE - Mini Mental State Exam  Orientation to time 5 5 5   Orientation to Place 5 5 5   Registration 3 3 3   Attention/ Calculation 5 5 5   Recall 0 3 3  Language- name 2 objects 2 2 2   Language- repeat 1 1 1   Language- follow 3 step command 3 3 3   Language- read & follow direction 1 1 1   Write a sentence 0 1 0  Copy design 0 1 1  Total score 25 30 29     Functional Status Survey: Is the patient deaf or have difficulty hearing?: No Does the patient have difficulty seeing, even when wearing glasses/contacts?: No Does the patient have difficulty concentrating, remembering, or making decisions?: Yes Does the patient have difficulty walking or climbing stairs?: No Does the patient have difficulty dressing or bathing?: Yes Does the patient have difficulty doing errands alone such as visiting a doctor's office or shopping?: Yes     04/22/2022    3:45 PM 07/24/2022    2:31 PM 01/21/2023   12:02 PM 02/03/2024    1:34 PM 02/03/2024    1:51 PM  Fall Risk  Falls in the past year? 0 1 1 1 1   Was there an injury with Fall? 0   0  0 0  Fall Risk Category Calculator 0  2 2  2  Patient at Risk for Falls Due to No Fall Risks History of fall(s) History of fall(s);Impaired balance/gait;Impaired mobility;Medication side effect  History of fall(s);Impaired balance/gait;Impaired mobility;Medication side effect  Fall risk Follow up Falls evaluation completed Falls evaluation completed Falls evaluation completed;Education provided Falls evaluation completed Falls evaluation completed;Falls prevention discussed;Education provided     Data saved with a previous flowsheet row definition       02/03/2024    1:50 PM  Depression screen PHQ 2/9  Decreased Interest 3  Down, Depressed, Hopeless 3  PHQ - 2 Score 6  Altered sleeping 3  Tired,  decreased energy 3  Change in appetite 2  Feeling bad or failure about yourself  3  Trouble concentrating 3  Moving slowly or fidgety/restless 3  Suicidal thoughts 0  PHQ-9 Score 23  Difficult doing work/chores Extremely dIfficult       Current Medication: Outpatient Encounter Medications as of 02/03/2024  Medication Sig   Continuous Glucose Sensor (DEXCOM G7 SENSOR) MISC Use 1 sensor every 10 days to monitor glucose with CGM for type 2 diabetes E11.65   venlafaxine  XR (EFFEXOR -XR) 75 MG 24 hr capsule Take 1 capsule (75 mg total) by mouth daily with breakfast.   Advocate Safety Lancets 26G MISC Use 1 safety lancet to check glucose 4 times daily  for diabetes E11.65   clopidogrel  (PLAVIX ) 75 MG tablet Take 1 tablet (75 mg total) by mouth daily.   dicyclomine  (BENTYL ) 10 MG capsule TAKE 1 CAPSULE BY MOUTH THREE TIMES DAILY BEFORE MEALS   empagliflozin  (JARDIANCE ) 25 MG TABS tablet Take 1 tablet (25 mg total) by mouth daily.   fenofibrate  54 MG tablet Take 1 tablet (54 mg total) by mouth daily.   gabapentin  (NEURONTIN ) 800 MG tablet Take 1 tablet (800 mg total) by mouth 3 (three) times daily.   glipiZIDE  (GLUCOTROL  XL) 5 MG 24 hr tablet Take 1 tablet (5 mg total) by mouth daily with breakfast.   glucose blood (ACCU-CHEK GUIDE) test strip Use 1 test strip to check glucose before meals, at bedtime and prn with glucose meter   icosapent  Ethyl (VASCEPA ) 1 g capsule Take 1 capsule (1 g total) by mouth 2 (two) times daily.   insulin  degludec (TRESIBA  FLEXTOUCH) 200 UNIT/ML FlexTouch Pen Inject 40 Units into the skin at bedtime. Increase by 4 units every 4 days if AM sugar >200.   isosorbide  mononitrate (IMDUR ) 30 MG 24 hr tablet Take 1 tablet (30 mg total) by mouth daily.   losartan  (COZAAR ) 25 MG tablet Take 1 tablet (25 mg total) by mouth daily.   metoprolol  succinate (TOPROL -XL) 50 MG 24 hr tablet Take 1 tablet (50 mg total) by mouth daily.   rosuvastatin  (CRESTOR ) 40 MG tablet Take 1 tablet  (40 mg total) by mouth daily.   traMADol  (ULTRAM ) 50 MG tablet TAKE ONE TABLET BY MOUTH THREE TIMES DAILY AS NEEDED FOR MODERATE OR SEVERE PAIN   ULTICARE MICRO PEN NEEDLES 32G X 4 MM MISC TO USE WITH INSULIN  DOSING THREE TIMES DAILY PRIOR TO MEALS AND AT BEDTIME FOR BASAL INSULIN .   [DISCONTINUED] empagliflozin  (JARDIANCE ) 25 MG TABS tablet Take 1 tablet (25 mg total) by mouth daily.   [DISCONTINUED] escitalopram  (LEXAPRO ) 10 MG tablet Take 1 tablet (10 mg total) by mouth daily.   [DISCONTINUED] escitalopram  (LEXAPRO ) 10 MG tablet Take 1 tablet (10 mg total) by mouth daily.   [DISCONTINUED] fenofibrate  54 MG tablet Take 1 tablet (54 mg total) by mouth daily.   [DISCONTINUED]  gabapentin  (NEURONTIN ) 800 MG tablet Take 1 tablet (800 mg total) by mouth 3 (three) times daily.   [DISCONTINUED] glipiZIDE  (GLUCOTROL  XL) 5 MG 24 hr tablet Take 1 tablet (5 mg total) by mouth daily with breakfast.   [DISCONTINUED] icosapent  Ethyl (VASCEPA ) 1 g capsule Take 1 capsule (1 g total) by mouth 2 (two) times daily.   [DISCONTINUED] mirtazapine  (REMERON ) 15 MG tablet Take 1 tablet (15 mg total) by mouth at bedtime.   No facility-administered encounter medications on file as of 02/03/2024.    Surgical History: Past Surgical History:  Procedure Laterality Date   ABDOMINAL HYSTERECTOMY  02/18/1988   BACK SURGERY  2000, 2004   CAROTID ENDARTERECTOMY Left 05/06/2012   CARPAL TUNNEL RELEASE Left 07/18/2015   Procedure: CARPAL TUNNEL RELEASE;  Surgeon: Kayla Pinal, MD;  Location: [facility] ORS;  Service: Orthopedics;  Laterality: Left;   CEREBRAL ANGIOGRAM Bilateral 05/03/2012   Procedure: CEREBRAL ANGIOGRAM;  Surgeon: Lonni GORMAN Blade, MD;  Location: [facility] CATH LAB;  Service: Cardiovascular;  Laterality: Bilateral;   CHOLECYSTECTOMY  02/18/1999   COLONOSCOPY WITH PROPOFOL  N/A 11/19/2018   Procedure: COLONOSCOPY WITH PROPOFOL ;  Surgeon: Jinny Carmine, MD;  Location: [facility] SURGERY CNTR;  Service: Endoscopy;   Laterality: N/A;  Diabetic - insulin    CORONARY ARTERY BYPASS GRAFT N/A 01/18/2021   Procedure: CORONARY ARTERY BYPASS GRAFTING (CABG) x 4  USING LEFT INTERNAL MAMMARY ARTERY AND LEFT ENDOSCOPIC GREATER SAPHENOUS VEIN CONDUITS;  Surgeon: Kerrin Elspeth BROCKS, MD;  Location: [facility] OR;  Service: Open Heart Surgery;  Laterality: N/A;   CORONARY/GRAFT ACUTE MI REVASCULARIZATION N/A 01/13/2021   Procedure: Coronary/Graft Acute MI Revascularization;  Surgeon: Ammon Blunt, MD;  Location: [facility] INVASIVE CV LAB;  Service: Cardiovascular;  Laterality: N/A;   ENDARTERECTOMY Left 05/06/2012   Procedure: ENDARTERECTOMY CAROTID;  Surgeon: Lonni GORMAN Blade, MD;  Location: [facility] OR;  Service: Vascular;  Laterality: Left;   ENDARTERECTOMY Right 08/09/2013   Procedure: ENDARTERECTOMY CAROTID-RIGHT;  Surgeon: Lonni GORMAN Blade, MD;  Location: [facility] OR;  Service: Vascular;  Laterality: Right;   ENDOVEIN HARVEST OF GREATER SAPHENOUS VEIN Left 01/18/2021   Procedure: ENDOVEIN HARVEST OF GREATER SAPHENOUS VEIN;  Surgeon: Kerrin Elspeth BROCKS, MD;  Location: [facility] OR;  Service: Open Heart Surgery;  Laterality: Left;   HERNIA REPAIR     IABP INSERTION Right 01/13/2021   Procedure: IABP Insertion;  Surgeon: Ammon Blunt, MD;  Location: [facility] INVASIVE CV LAB;  Service: Cardiovascular;  Laterality: Right;   LEFT HEART CATH AND CORONARY ANGIOGRAPHY N/A 01/13/2021   Procedure: LEFT HEART CATH AND CORONARY ANGIOGRAPHY;  Surgeon: Ammon Blunt, MD;  Location: [facility] INVASIVE CV LAB;  Service: Cardiovascular;  Laterality: N/A;   LOWER EXTREMITY ANGIOGRAPHY Left 01/03/2021   Procedure: LOWER EXTREMITY ANGIOGRAPHY;  Surgeon: Marea Selinda GORMAN, MD;  Location: [facility] INVASIVE CV LAB;  Service: Cardiovascular;  Laterality: Left;   LOWER EXTREMITY ANGIOGRAPHY Left 02/28/2021   Procedure: LOWER EXTREMITY ANGIOGRAPHY;  Surgeon: Marea Selinda GORMAN, MD;  Location: [facility] INVASIVE CV LAB;  Service: Cardiovascular;  Laterality: Left;    PATCH ANGIOPLASTY Left 05/06/2012   Procedure: WITH DACRON PATCH ANGIOPLASTY ;  Surgeon: Lonni GORMAN Blade, MD;  Location: [facility] OR;  Service: Vascular;  Laterality: Left;   POLYPECTOMY  11/19/2018   Procedure: POLYPECTOMY;  Surgeon: Jinny Carmine, MD;  Location: [facility] SURGERY CNTR;  Service: Endoscopy;;   SPINE SURGERY  02/17/2002   TEE WITHOUT CARDIOVERSION N/A 01/18/2021   Procedure: TRANSESOPHAGEAL ECHOCARDIOGRAM (TEE);  Surgeon: Kerrin Elspeth BROCKS, MD;  Location: [facility] OR;  Service: Open Heart Surgery;  Laterality: N/A;   TONSILLECTOMY     TUBAL LIGATION      Medical History: Past Medical History:  Diagnosis Date   Anxiety    Arthritis    Bilateral carotid artery stenosis 2014   Carotid artery occlusion    Chronic kidney disease 06/18/2015   UTI   Chronic kidney disease 2017   Current smoker    CVA (cerebral vascular accident) (HCC) 2013   Depression    Diabetes (HCC)    Diverticulosis    Fatty liver    Fibromyalgia    GERD (gastroesophageal reflux disease)    H/O hiatal hernia    Heart attack (HCC) 01/12/2021   Hiatal hernia    Hypercholesteremia    Hypertension    IBS (irritable bowel syndrome)    PAD (peripheral artery disease)    Peptic ulcer    Plantar fasciitis    Stroke (HCC) 01/31/2012   Right side-ministroke   T2DM (type 2 diabetes mellitus) (HCC)     Family History: Family History  Problem Relation Age of Onset   Hypertension Mother    Hyperlipidemia Mother    Deep vein thrombosis Mother    Cancer Father    Alcohol abuse Father    Heart failure Father    Hypertension Maternal Grandmother    Deep vein thrombosis Sister    Alcohol abuse Sister    Alcohol abuse Sister     Social History   Socioeconomic History   Marital status: Married    Spouse name: Not on file   Number of children: Not on file   Years of education: Not on file   Highest education level: Not on file  Occupational History   Not on file  Tobacco Use   Smoking status:  Former    Current packs/day: 1.00    Average packs/day: 1 pack/day for 53.3 years (53.3 ttl pk-yrs)    Types: Cigarettes    Start date: 11/09/1970    Passive exposure: Current   Smokeless tobacco: Never   Tobacco comments:       Vaping Use   Vaping status: Former  Substance and Sexual Activity   Alcohol use: Not Currently    Comment: rarely   Drug use: No   Sexual activity: Not Currently    Partners: Male  Other Topics Concern   Not on file  Social History Narrative   ** Merged History Encounter **       Social Drivers of Health   Tobacco Use: Medium Risk (02/03/2024)   Patient History    Smoking Tobacco Use: Former    Smokeless Tobacco Use: Never    Passive Exposure: Current  Physicist, Medical Strain: Not on file  Food Insecurity: No Food Insecurity (05/15/2023)   Hunger Vital Sign    Worried About Running Out of Food in the Last Year: Never true    Ran Out of Food in the Last Year: Never true  Transportation Needs: No Transportation Needs (05/15/2023)   PRAPARE - Administrator, Civil Service (Medical): No    Lack of Transportation (Non-Medical): No  Physical Activity: Not on file  Stress: Not on file  Social Connections: Not on file  Intimate Partner Violence: Not At Risk (05/15/2023)   Humiliation, Afraid, Rape, and Kick questionnaire    Fear of Current or Ex-Partner: No    Emotionally Abused: No    Physically Abused: No    Sexually Abused: No  Depression (PHQ2-9): High Risk (  02/03/2024)   Depression (PHQ2-9)    PHQ-2 Score: 23  Alcohol Screen: Low Risk (01/21/2023)   Alcohol Screen    Last Alcohol Screening Score (AUDIT): 0  Housing: Low Risk (05/15/2023)   Housing Stability Vital Sign    Unable to Pay for Housing in the Last Year: No    Number of Times Moved in the Last Year: 0    Homeless in the Last Year: No  Utilities: Not At Risk (05/15/2023)   [facility] Utilities    Threatened with loss of utilities: No  Health Literacy: Not on file       Review of Systems  Constitutional:  Positive for activity change, appetite change, fatigue and unexpected weight change. Negative for chills and fever.  HENT: Negative.  Negative for congestion, ear pain, rhinorrhea, sore throat and trouble swallowing.   Eyes: Negative.   Respiratory: Negative.  Negative for cough, chest tightness, shortness of breath and wheezing.   Cardiovascular: Negative.  Negative for chest pain and palpitations.  Gastrointestinal:  Positive for abdominal distention (bloating, heartburn), abdominal pain, constipation and diarrhea. Negative for blood in stool, nausea and vomiting.  Endocrine: Negative.   Genitourinary: Negative.  Negative for difficulty urinating, dysuria, frequency, hematuria and urgency.  Musculoskeletal:  Positive for arthralgias and back pain. Negative for joint swelling, myalgias and neck pain.  Skin: Negative.  Negative for rash and wound.  Allergic/Immunologic: Negative.  Negative for immunocompromised state.  Neurological:  Positive for weakness. Negative for dizziness, seizures, numbness and headaches.  Hematological: Negative.   Psychiatric/Behavioral:  Positive for behavioral problems, decreased concentration and sleep disturbance. Negative for self-injury and suicidal ideas. The patient is nervous/anxious.     Vital Signs: BP (!) 170/105   Pulse 96   Temp (!) 97.4 F (36.3 C)   Resp 16   Ht 5' 7 (1.702 m)   Wt 170 lb 9.6 oz (77.4 kg)   LMP  (LMP Unknown) Comment: pt intubated  SpO2 95%   BMI 26.72 kg/m    Physical Exam Vitals reviewed.  Constitutional:      General: She is awake. She is not in acute distress.    Appearance: She is obese. She is ill-appearing. She is not diaphoretic.     Comments: Disheveled, unkempt and wearing her pajamas.  HENT:     Head: Normocephalic and atraumatic.     Right Ear: Tympanic membrane, ear canal and external ear normal.     Left Ear: Tympanic membrane, ear canal and external ear  normal.     Nose: Nose normal. No congestion or rhinorrhea.     Mouth/Throat:     Pharynx: No oropharyngeal exudate or posterior oropharyngeal erythema.  Eyes:     General: Lids are normal. Vision grossly intact. Gaze aligned appropriately. No scleral icterus.       Right eye: No discharge.        Left eye: No discharge.     Extraocular Movements: Extraocular movements intact.     Conjunctiva/sclera: Conjunctivae normal.     Pupils: Pupils are equal, round, and reactive to light.     Funduscopic exam:    Right eye: Red reflex present.        Left eye: Red reflex present. Neck:     Thyroid : No thyromegaly.     Vascular: No JVD.     Trachea: Trachea and phonation normal. No tracheal deviation.  Cardiovascular:     Rate and Rhythm: Normal rate and regular rhythm.     Pulses:  Normal pulses.          Dorsalis pedis pulses are 2+ on the right side and 2+ on the left side.       Posterior tibial pulses are 2+ on the right side and 2+ on the left side.     Heart sounds: Normal heart sounds, S1 normal and S2 normal. No murmur heard.    No friction rub. No gallop.  Pulmonary:     Effort: Pulmonary effort is normal. No accessory muscle usage or respiratory distress.     Breath sounds: Normal breath sounds and air entry. No stridor or decreased air movement. No wheezing or rales.  Chest:     Chest wall: No tenderness.     Comments: Declined clinical breast exam, gets annual mammograms.  Abdominal:     General: Bowel sounds are normal. There is distension.     Palpations: Abdomen is soft. There is no shifting dullness, fluid wave, mass or pulsatile mass.     Tenderness: There is generalized abdominal tenderness (related to IBS, comes and goes.). There is no guarding or rebound.  Musculoskeletal:        General: No tenderness or deformity. Normal range of motion.     Cervical back: Normal range of motion and neck supple.     Right lower leg: 1+ Edema present.     Left lower leg: 1+ Edema  present.     Right foot: Normal range of motion.  Feet:     Right foot:     Protective Sensation: 6 sites tested.  6 sites sensed.     Skin integrity: Callus and dry skin present.     Toenail Condition: Right toenails are abnormally thick.     Left foot:     Protective Sensation: 6 sites tested.  6 sites sensed.     Skin integrity: Callus and dry skin present.     Toenail Condition: Left toenails are abnormally thick.  Lymphadenopathy:     Cervical: No cervical adenopathy.  Skin:    General: Skin is warm and dry.     Capillary Refill: Capillary refill takes less than 2 seconds.     Coloration: Skin is not pale.     Findings: No erythema or rash.  Neurological:     Mental Status: She is alert and oriented to person, place, and time.     Cranial Nerves: No cranial nerve deficit.     Motor: No abnormal muscle tone.     Coordination: Coordination normal.     Deep Tendon Reflexes: Reflexes are normal and symmetric.  Psychiatric:        Attention and Perception: She is inattentive.        Mood and Affect: Mood is anxious and depressed. Affect is flat.        Speech: Speech is delayed.        Behavior: Behavior is slowed and withdrawn. Behavior is cooperative.        Thought Content: Thought content normal. Thought content is not paranoid or delusional. Thought content does not include homicidal or suicidal ideation. Thought content does not include homicidal or suicidal plan.        Cognition and Memory: Cognition and memory normal.        Judgment: Judgment normal.        Assessment/Plan: 1. Encounter for Medicare annual examination with abnormal findings (Primary) Age-appropriate preventive screenings and vaccinations discussed. Routine labs for health maintenance ordered, see below. PHM updated.   -  CBC with Differential/Platelet - CMP14+EGFR - Hgb A1C w/o eAG - Lipid Profile - Parathyroid hormone, intact (no Ca) - Vitamin D  (25 hydroxy) - TSH + free T4 - B12 and Folate  Panel  2. Type 2 diabetes mellitus with diabetic polyneuropathy, with long-term current use of insulin  (HCC) Restart using CGM sensor to monitor glucose. Restart jardiance , basal insuline and glipizide  as prescribed. Routine labs ordered. - empagliflozin  (JARDIANCE ) 25 MG TABS tablet; Take 1 tablet (25 mg total) by mouth daily.  Dispense: 90 tablet; Refill: 3 - fenofibrate  54 MG tablet; Take 1 tablet (54 mg total) by mouth daily.  Dispense: 90 tablet; Refill: 1 - gabapentin  (NEURONTIN ) 800 MG tablet; Take 1 tablet (800 mg total) by mouth 3 (three) times daily.  Dispense: 90 tablet; Refill: 5 - glipiZIDE  (GLUCOTROL  XL) 5 MG 24 hr tablet; Take 1 tablet (5 mg total) by mouth daily with breakfast.  Dispense: 90 tablet; Refill: 1 - icosapent  Ethyl (VASCEPA ) 1 g capsule; Take 1 capsule (1 g total) by mouth 2 (two) times daily.  Dispense: 120 capsule; Refill: 1 - CBC with Differential/Platelet - CMP14+EGFR - Hgb A1C w/o eAG - Lipid Profile - Parathyroid hormone, intact (no Ca) - Continuous Glucose Sensor (DEXCOM G7 SENSOR) MISC; Use 1 sensor every 10 days to monitor glucose with CGM for type 2 diabetes E11.65  Dispense: 3 each; Refill: 11 - Urine Microalbumin w/creat. ratio  3. Hypertension associated with type 2 diabetes mellitus (HCC) Routine labs ordered, Continue BP medications as prescribed. Instructed patient to take her medications as prescribed and make sure to request her refills to be filled by her pharmacy. She has multiple refills at the pharmacy now. - CBC with Differential/Platelet - CMP14+EGFR - Lipid Profile  4. Hyperlipidemia associated with type 2 diabetes mellitus (HCC) Routine lab ordered. Continue max dose rosuvastatin , as well as fenofibrate  and vascepa  as prescribed.  - fenofibrate  54 MG tablet; Take 1 tablet (54 mg total) by mouth daily.  Dispense: 90 tablet; Refill: 1 - icosapent  Ethyl (VASCEPA ) 1 g capsule; Take 1 capsule (1 g total) by mouth 2 (two) times daily.   Dispense: 120 capsule; Refill: 1 - CBC with Differential/Platelet - CMP14+EGFR - Hgb A1C w/o eAG - Lipid Profile  5. Hypertensive heart disease with heart failure (HCC) Continue BP medications as prescribed. Instructed patient to take her medications as prescribed and make sure to request her refills to be filled by her pharmacy. She has multiple refills at the pharmacy now.   6. Atherosclerosis of native artery of left lower extremity with rest pain (HCC) Routine lab ordered. Continue max dose rosuvastatin , as well as fenofibrate  and vascepa  as prescribed.  - fenofibrate  54 MG tablet; Take 1 tablet (54 mg total) by mouth daily.  Dispense: 90 tablet; Refill: 1 - icosapent  Ethyl (VASCEPA ) 1 g capsule; Take 1 capsule (1 g total) by mouth 2 (two) times daily.  Dispense: 120 capsule; Refill: 1 - Lipid Profile  7. Acquired hypothyroidism Continue levothyroxine as prescribed. Routine labs ordered  - CBC with Differential/Platelet - CMP14+EGFR - Hgb A1C w/o eAG - Lipid Profile - Parathyroid hormone, intact (no Ca) - TSH + free T4  8. B12 deficiency Routine labs ordered  - CBC with Differential/Platelet - B12 and Folate Panel  9. Vitamin D  deficiency Routine lab ordered  - Vitamin D  (25 hydroxy)  10. History of smoking greater than 50 pack years CT chest low dose LCS ordered.  - CT CHEST LUNG CA SCREEN LOW DOSE W/O CM;  Future  11. Encounter for screening mammogram for malignant neoplasm of breast Routine mammogram ordered  - MM 3D SCREENING MAMMOGRAM BILATERAL BREAST; Future  12. Severe episode of recurrent major depressive disorder, without psychotic features (HCC) Discontinue mirtazapine  and start venlafaxine  as prescribed. Declined referral to psychiatry or therapist for now.  - venlafaxine  XR (EFFEXOR -XR) 75 MG 24 hr capsule; Take 1 capsule (75 mg total) by mouth daily with breakfast.  Dispense: 90 capsule; Refill: 1     General Counseling: yeni jiggetts understanding  of the findings of todays visit and agrees with plan of treatment. I have discussed any further diagnostic evaluation that may be needed or ordered today. We also reviewed her medications today. she has been encouraged to call the office with any questions or concerns that should arise related to todays visit.    Orders Placed This Encounter  Procedures   MM 3D SCREENING MAMMOGRAM BILATERAL BREAST   CT CHEST LUNG CA SCREEN LOW DOSE W/O CM   CBC with Differential/Platelet   CMP14+EGFR   Hgb A1C w/o eAG   Lipid Profile   Parathyroid hormone, intact (no Ca)   Vitamin D  (25 hydroxy)   TSH + free T4   B12 and Folate Panel   Urine Microalbumin w/creat. ratio    Meds ordered this encounter  Medications   empagliflozin  (JARDIANCE ) 25 MG TABS tablet    Sig: Take 1 tablet (25 mg total) by mouth daily.    Dispense:  90 tablet    Refill:  3   DISCONTD: escitalopram  (LEXAPRO ) 10 MG tablet    Sig: Take 1 tablet (10 mg total) by mouth daily.    Dispense:  90 tablet    Refill:  0   fenofibrate  54 MG tablet    Sig: Take 1 tablet (54 mg total) by mouth daily.    Dispense:  90 tablet    Refill:  1   gabapentin  (NEURONTIN ) 800 MG tablet    Sig: Take 1 tablet (800 mg total) by mouth 3 (three) times daily.    Dispense:  90 tablet    Refill:  5   glipiZIDE  (GLUCOTROL  XL) 5 MG 24 hr tablet    Sig: Take 1 tablet (5 mg total) by mouth daily with breakfast.    Dispense:  90 tablet    Refill:  1   icosapent  Ethyl (VASCEPA ) 1 g capsule    Sig: Take 1 capsule (1 g total) by mouth 2 (two) times daily.    Dispense:  120 capsule    Refill:  1    FOR FUTURE REFILLS **BUBLE PACK PR**   venlafaxine  XR (EFFEXOR -XR) 75 MG 24 hr capsule    Sig: Take 1 capsule (75 mg total) by mouth daily with breakfast.    Dispense:  90 capsule    Refill:  1    Fill new script today, discontinue mirtazapine  and escitalopram .   Continuous Glucose Sensor (DEXCOM G7 SENSOR) MISC    Sig: Use 1 sensor every 10 days to  monitor glucose with CGM for type 2 diabetes E11.65    Dispense:  3 each    Refill:  11    Dx code E11.65    Return in about 3 weeks (around 02/24/2024) for F/U, Labs, eval new med, [facility] PCP diabetes management.   Total time spent:30 Minutes Time spent includes review of chart, medications, test results, and follow up plan with the patient.   [place] Controlled Substance Database was reviewed by me.  This patient  was seen by Mardy Maxin, FNP-C in collaboration with Dr. Sigrid Bathe as a part of collaborative care agreement.  Ceferino Lang R. Maxin, MSN, FNP-C Internal medicine

## 2024-02-03 NOTE — Patient Instructions (Signed)
 STOP escitalopram  and mirtazapine   START venlafaxine 

## 2024-02-04 ENCOUNTER — Telehealth: Payer: Self-pay | Admitting: Nurse Practitioner

## 2024-02-04 LAB — MICROALBUMIN / CREATININE URINE RATIO
Creatinine, Urine: 90.4 mg/dL
Microalb/Creat Ratio: 2624 mg/g{creat} — ABNORMAL HIGH (ref 0–29)
Microalbumin, Urine: 2372.4 ug/mL

## 2024-02-04 NOTE — Telephone Encounter (Signed)
 Lvm notifying husband of mammogram appointment date, arrival time, location -Morgan Daniels

## 2024-02-15 ENCOUNTER — Other Ambulatory Visit: Payer: Self-pay | Admitting: Nurse Practitioner

## 2024-02-15 ENCOUNTER — Telehealth: Payer: Self-pay

## 2024-02-15 ENCOUNTER — Other Ambulatory Visit
Admission: RE | Admit: 2024-02-15 | Discharge: 2024-02-15 | Disposition: A | Discharge status: Home / Self Care | Attending: Nurse Practitioner | Admitting: Nurse Practitioner

## 2024-02-15 ENCOUNTER — Encounter: Payer: Self-pay | Admitting: Nurse Practitioner

## 2024-02-15 DIAGNOSIS — Z0001 Encounter for general adult medical examination with abnormal findings: Secondary | ICD-10-CM | POA: Insufficient documentation

## 2024-02-15 DIAGNOSIS — E876 Hypokalemia: Secondary | ICD-10-CM

## 2024-02-15 DIAGNOSIS — E785 Hyperlipidemia, unspecified: Secondary | ICD-10-CM | POA: Insufficient documentation

## 2024-02-15 DIAGNOSIS — Z794 Long term (current) use of insulin: Secondary | ICD-10-CM | POA: Diagnosis not present

## 2024-02-15 DIAGNOSIS — E1169 Type 2 diabetes mellitus with other specified complication: Secondary | ICD-10-CM | POA: Diagnosis not present

## 2024-02-15 DIAGNOSIS — E1142 Type 2 diabetes mellitus with diabetic polyneuropathy: Secondary | ICD-10-CM | POA: Insufficient documentation

## 2024-02-15 DIAGNOSIS — E559 Vitamin D deficiency, unspecified: Secondary | ICD-10-CM | POA: Insufficient documentation

## 2024-02-15 DIAGNOSIS — E538 Deficiency of other specified B group vitamins: Secondary | ICD-10-CM | POA: Diagnosis not present

## 2024-02-15 LAB — COMPREHENSIVE METABOLIC PANEL WITH GFR
ALT: 10 U/L (ref 0–44)
AST: 20 U/L (ref 15–41)
Albumin: 4.5 g/dL (ref 3.5–5.0)
Alkaline Phosphatase: 72 U/L (ref 38–126)
Anion gap: 13 (ref 5–15)
BUN: 13 mg/dL (ref 8–23)
CO2: 31 mmol/L (ref 22–32)
Calcium: 9.4 mg/dL (ref 8.9–10.3)
Chloride: 100 mmol/L (ref 98–111)
Creatinine, Ser: 0.93 mg/dL (ref 0.44–1.00)
GFR, Estimated: >60 mL/min
Glucose, Bld: 153 mg/dL — ABNORMAL HIGH (ref 70–99)
Potassium: 2.5 mmol/L — CL (ref 3.5–5.1)
Sodium: 144 mmol/L (ref 135–145)
Total Bilirubin: 0.3 mg/dL (ref 0.0–1.2)
Total Protein: 7.9 g/dL (ref 6.5–8.1)

## 2024-02-15 LAB — CBC WITH DIFFERENTIAL/PLATELET
Abs Immature Granulocytes: 0.02 K/uL (ref 0.00–0.07)
Basophils Absolute: 0 K/uL (ref 0.0–0.1)
Basophils Relative: 1 %
Eosinophils Absolute: 0.2 K/uL (ref 0.0–0.5)
Eosinophils Relative: 3 %
HCT: 41.4 % (ref 36.0–46.0)
Hemoglobin: 14.1 g/dL (ref 12.0–15.0)
Immature Granulocytes: 0 %
Lymphocytes Relative: 35 %
Lymphs Abs: 2.5 K/uL (ref 0.7–4.0)
MCH: 27.1 pg (ref 26.0–34.0)
MCHC: 34.1 g/dL (ref 30.0–36.0)
MCV: 79.5 fL — ABNORMAL LOW (ref 80.0–100.0)
Monocytes Absolute: 0.4 K/uL (ref 0.1–1.0)
Monocytes Relative: 6 %
Neutro Abs: 3.9 K/uL (ref 1.7–7.7)
Neutrophils Relative %: 55 %
Platelets: 337 K/uL (ref 150–400)
RBC: 5.21 MIL/uL — ABNORMAL HIGH (ref 3.87–5.11)
RDW: 13.2 % (ref 11.5–15.5)
WBC: 7.1 K/uL (ref 4.0–10.5)
nRBC: 0 % (ref 0.0–0.2)

## 2024-02-15 LAB — MAGNESIUM: Magnesium: 2.1 mg/dL (ref 1.7–2.4)

## 2024-02-15 LAB — LIPID PANEL
Cholesterol: 186 mg/dL (ref 0–200)
HDL: 46 mg/dL
LDL Cholesterol: 101 mg/dL — ABNORMAL HIGH (ref 0–99)
Total CHOL/HDL Ratio: 4.1 ratio
Triglycerides: 196 mg/dL — ABNORMAL HIGH
VLDL: 39 mg/dL (ref 0–40)

## 2024-02-15 LAB — TSH: TSH: 1.54 u[IU]/mL (ref 0.350–4.500)

## 2024-02-15 LAB — VITAMIN D 25 HYDROXY (VIT D DEFICIENCY, FRACTURES): Vit D, 25-Hydroxy: 13.3 ng/mL — ABNORMAL LOW (ref 30–100)

## 2024-02-15 LAB — HEMOGLOBIN A1C
Hgb A1c MFr Bld: 9.8 % — ABNORMAL HIGH (ref 4.8–5.6)
Mean Plasma Glucose: 234.56 mg/dL

## 2024-02-15 LAB — VITAMIN B12: Vitamin B-12: 401 pg/mL (ref 180–914)

## 2024-02-15 LAB — FOLATE: Folate: 10.9 ng/mL

## 2024-02-15 LAB — PHOSPHORUS: Phosphorus: 4 mg/dL (ref 2.5–4.6)

## 2024-02-15 LAB — T4, FREE: Free T4: 0.98 ng/dL (ref 0.80–2.00)

## 2024-02-15 MED ORDER — POTASSIUM CHLORIDE CRYS ER 20 MEQ PO TBCR: 40.0000 meq | EXTENDED_RELEASE_TABLET | Freq: Two times a day (BID) | ORAL | 0 refills | Status: AC

## 2024-02-15 NOTE — Telephone Encounter (Signed)
 Mandy from [facility] called for critical labs potassium 2.5 alyssa aware

## 2024-02-15 NOTE — Progress Notes (Signed)
 Called [facility] and already add on

## 2024-02-15 NOTE — Telephone Encounter (Signed)
 Spoke with [facility] labs spoke with [facility] for add on  magnesium  and phosphorus

## 2024-02-16 ENCOUNTER — Ambulatory Visit: Payer: Self-pay | Admitting: Nurse Practitioner

## 2024-02-16 LAB — PARATHYROID HORMONE, INTACT (NO CA): PTH: 40 pg/mL (ref 15–65)

## 2024-02-16 NOTE — Progress Notes (Signed)
 Low potassium level was discussed with the patient's spouse, Morgan Daniels yesterday 02/15/24.  She was prescribed 40 meq twice a day for 3 days of potassium chloride  and then will have her lab drawn again to check her potassium level on Friday this week which is 02/19/24.  Unsure why her potassium is low. Her uncontrolled diabetes may be a contributing factor. She also has severe microalbuminuria although her eGFR and creatinine are normal.  She does drink a lot of diet sodas and has been having persistent diarrhea which are both contributing factors. Her appetite is also poor and she does not eat very much so she is probably not eating very much foods that have a lot of potassium in them.

## 2024-02-17 ENCOUNTER — Encounter: Payer: Self-pay | Admitting: Nurse Practitioner

## 2024-02-17 ENCOUNTER — Inpatient Hospital Stay: Admission: RE | Admit: 2024-02-17 | Source: Ambulatory Visit

## 2024-02-17 DIAGNOSIS — I110 Hypertensive heart disease with heart failure: Secondary | ICD-10-CM | POA: Insufficient documentation

## 2024-02-17 DIAGNOSIS — I70222 Atherosclerosis of native arteries of extremities with rest pain, left leg: Secondary | ICD-10-CM | POA: Insufficient documentation

## 2024-02-17 DIAGNOSIS — Z87891 Personal history of nicotine dependence: Secondary | ICD-10-CM | POA: Insufficient documentation

## 2024-02-19 ENCOUNTER — Other Ambulatory Visit
Admission: RE | Admit: 2024-02-19 | Discharge: 2024-02-19 | Disposition: A | Discharge status: Home / Self Care | Attending: Nurse Practitioner | Admitting: Nurse Practitioner

## 2024-02-19 DIAGNOSIS — E876 Hypokalemia: Secondary | ICD-10-CM | POA: Diagnosis present

## 2024-02-19 LAB — POTASSIUM: Potassium: 3.2 mmol/L — ABNORMAL LOW (ref 3.5–5.1)

## 2024-02-22 ENCOUNTER — Ambulatory Visit: Payer: Self-pay | Admitting: Nurse Practitioner

## 2024-02-22 DIAGNOSIS — M064 Inflammatory polyarthropathy: Secondary | ICD-10-CM

## 2024-02-22 DIAGNOSIS — E876 Hypokalemia: Secondary | ICD-10-CM

## 2024-02-22 MED ORDER — POTASSIUM CHLORIDE CRYS ER 20 MEQ PO TBCR: 40.0000 meq | EXTENDED_RELEASE_TABLET | Freq: Every day | ORAL | 0 refills | Status: AC

## 2024-02-22 NOTE — Progress Notes (Signed)
 Potassium has improved. Lets continue potassium chloride  40 meq daily for a few weeks and then recheck the potassium level again.  I have sent the order for the medication and the lab.  This is a decrease. She was taking 2 tablets twice daily. Now she will just take 2 tablets once daily.

## 2024-02-23 MED ORDER — FLUCONAZOLE 150 MG PO TABS: 150.0000 mg | ORAL_TABLET | Freq: Once | ORAL | 0 refills | Status: AC

## 2024-02-23 MED ORDER — TRAMADOL HCL 50 MG PO TABS: ORAL_TABLET | ORAL | 2 refills | Status: AC

## 2024-02-23 NOTE — Progress Notes (Signed)
Pt notified that we sent med

## 2024-02-25 ENCOUNTER — Ambulatory Visit
Admission: RE | Admit: 2024-02-25 | Discharge: 2024-02-25 | Disposition: A | Discharge status: Home / Self Care | Source: Ambulatory Visit | Attending: Nurse Practitioner | Admitting: Nurse Practitioner

## 2024-02-25 DIAGNOSIS — Z87891 Personal history of nicotine dependence: Secondary | ICD-10-CM

## 2024-03-01 ENCOUNTER — Encounter

## 2024-03-09 ENCOUNTER — Ambulatory Visit: Admitting: Nurse Practitioner

## 2025-02-06 ENCOUNTER — Ambulatory Visit: Admitting: Nurse Practitioner
